# Patient Record
Sex: Female | Born: 1935
Health system: Southern US, Community
[De-identification: ages and names within clinical notes are randomized; demographics above are authoritative.]

## PROBLEM LIST (undated history)

## (undated) DIAGNOSIS — M1990 Unspecified osteoarthritis, unspecified site: Secondary | ICD-10-CM

## (undated) DIAGNOSIS — I482 Chronic atrial fibrillation, unspecified: Secondary | ICD-10-CM

## (undated) DIAGNOSIS — E538 Deficiency of other specified B group vitamins: Secondary | ICD-10-CM

## (undated) DIAGNOSIS — N3281 Overactive bladder: Secondary | ICD-10-CM

## (undated) DIAGNOSIS — M21061 Valgus deformity, not elsewhere classified, right knee: Secondary | ICD-10-CM

## (undated) DIAGNOSIS — D6869 Other thrombophilia: Secondary | ICD-10-CM

## (undated) DIAGNOSIS — I4891 Unspecified atrial fibrillation: Secondary | ICD-10-CM

## (undated) DIAGNOSIS — H9113 Presbycusis, bilateral: Secondary | ICD-10-CM

## (undated) DIAGNOSIS — E785 Hyperlipidemia, unspecified: Secondary | ICD-10-CM

## (undated) DIAGNOSIS — Z85038 Personal history of other malignant neoplasm of large intestine: Secondary | ICD-10-CM

## (undated) DIAGNOSIS — R410 Disorientation, unspecified: Secondary | ICD-10-CM

## (undated) DIAGNOSIS — C801 Malignant (primary) neoplasm, unspecified: Secondary | ICD-10-CM

## (undated) DIAGNOSIS — I639 Cerebral infarction, unspecified: Secondary | ICD-10-CM

## (undated) DIAGNOSIS — I10 Essential (primary) hypertension: Secondary | ICD-10-CM

## (undated) DIAGNOSIS — F01A0 Vascular dementia, mild, without behavioral disturbance, psychotic disturbance, mood disturbance, and anxiety: Secondary | ICD-10-CM

## (undated) DIAGNOSIS — M85839 Other specified disorders of bone density and structure, unspecified forearm: Secondary | ICD-10-CM

## (undated) DIAGNOSIS — H269 Unspecified cataract: Secondary | ICD-10-CM

## (undated) DIAGNOSIS — C189 Malignant neoplasm of colon, unspecified: Secondary | ICD-10-CM

## (undated) DIAGNOSIS — R569 Unspecified convulsions: Secondary | ICD-10-CM

## (undated) DIAGNOSIS — G2581 Restless legs syndrome: Secondary | ICD-10-CM

## (undated) DIAGNOSIS — D7589 Other specified diseases of blood and blood-forming organs: Secondary | ICD-10-CM

## (undated) DIAGNOSIS — I499 Cardiac arrhythmia, unspecified: Secondary | ICD-10-CM

## (undated) DIAGNOSIS — F419 Anxiety disorder, unspecified: Secondary | ICD-10-CM

## (undated) DIAGNOSIS — K219 Gastro-esophageal reflux disease without esophagitis: Secondary | ICD-10-CM

## (undated) DIAGNOSIS — R011 Cardiac murmur, unspecified: Secondary | ICD-10-CM

## (undated) DIAGNOSIS — H532 Diplopia: Secondary | ICD-10-CM

## (undated) DIAGNOSIS — E039 Hypothyroidism, unspecified: Secondary | ICD-10-CM

## (undated) DIAGNOSIS — M069 Rheumatoid arthritis, unspecified: Secondary | ICD-10-CM

## (undated) DIAGNOSIS — D649 Anemia, unspecified: Secondary | ICD-10-CM

## (undated) DIAGNOSIS — K5903 Drug induced constipation: Secondary | ICD-10-CM

## (undated) DIAGNOSIS — T7840XA Allergy, unspecified, initial encounter: Secondary | ICD-10-CM

## (undated) DIAGNOSIS — Z780 Asymptomatic menopausal state: Secondary | ICD-10-CM

## (undated) HISTORY — PX: TONSILLECTOMY: SUR1361

## (undated) HISTORY — PX: COLON SURGERY: SHX602

## (undated) HISTORY — DX: Allergy, unspecified, initial encounter: T78.40XA

## (undated) HISTORY — DX: Deficiency of other specified B group vitamins: E53.8

## (undated) HISTORY — DX: Chronic atrial fibrillation, unspecified: I48.20

## (undated) HISTORY — DX: Other thrombophilia: D68.69

## (undated) HISTORY — DX: Unspecified cataract: H26.9

## (undated) HISTORY — DX: Diplopia: H53.2

## (undated) HISTORY — DX: Malignant neoplasm of colon, unspecified: C18.9

## (undated) HISTORY — DX: Valgus deformity, not elsewhere classified, right knee: M21.061

## (undated) HISTORY — DX: Personal history of other malignant neoplasm of large intestine: Z85.038

## (undated) HISTORY — DX: Other specified diseases of blood and blood-forming organs: D75.89

## (undated) HISTORY — DX: Vascular dementia, mild, without behavioral disturbance, psychotic disturbance, mood disturbance, and anxiety: F01.A0

## (undated) HISTORY — PX: EYE SURGERY: SHX253

## (undated) HISTORY — DX: Presbycusis, bilateral: H91.13

## (undated) HISTORY — DX: Hyperlipidemia, unspecified: E78.5

## (undated) HISTORY — DX: Other specified disorders of bone density and structure, unspecified forearm: M85.839

## (undated) HISTORY — DX: Asymptomatic menopausal state: Z78.0

## (undated) HISTORY — DX: Overactive bladder: N32.81

## (undated) HISTORY — PX: COLONOSCOPY: SHX174

## (undated) HISTORY — DX: Unspecified atrial fibrillation: I48.91

## (undated) HISTORY — DX: Disorientation, unspecified: R41.0

---
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
## 1968-03-26 HISTORY — PX: ABDOMINAL HYSTERECTOMY: SHX81

## 2004-03-26 DIAGNOSIS — C801 Malignant (primary) neoplasm, unspecified: Secondary | ICD-10-CM

## 2004-03-26 HISTORY — PX: COLON RESECTION: SHX5231

## 2004-03-26 HISTORY — DX: Malignant (primary) neoplasm, unspecified: C80.1

## 2011-07-02 DIAGNOSIS — F32A Depression, unspecified: Secondary | ICD-10-CM | POA: Insufficient documentation

## 2011-07-02 DIAGNOSIS — F329 Major depressive disorder, single episode, unspecified: Secondary | ICD-10-CM | POA: Insufficient documentation

## 2013-01-12 LAB — HEMOGLOBIN A1C: Hemoglobin A1C: 5.5

## 2013-01-20 HISTORY — PX: MITRAL VALVE REPAIR: SHX2039

## 2013-01-20 HISTORY — PX: TRICUSPID VALVE SURGERY: SHX817

## 2013-01-21 DIAGNOSIS — R569 Unspecified convulsions: Secondary | ICD-10-CM | POA: Insufficient documentation

## 2013-07-14 DIAGNOSIS — I10 Essential (primary) hypertension: Secondary | ICD-10-CM | POA: Insufficient documentation

## 2013-09-07 LAB — HM HEPATITIS C SCREENING LAB: HM Hepatitis Screen: NEGATIVE

## 2013-09-09 LAB — HM DEXA SCAN

## 2013-11-10 DIAGNOSIS — G2581 Restless legs syndrome: Secondary | ICD-10-CM | POA: Insufficient documentation

## 2015-02-02 DIAGNOSIS — M48061 Spinal stenosis, lumbar region without neurogenic claudication: Secondary | ICD-10-CM | POA: Insufficient documentation

## 2015-03-01 ENCOUNTER — Other Ambulatory Visit: Payer: Self-pay | Admitting: Neurosurgery

## 2015-03-27 NOTE — H&P (Signed)
Patient ID:   [id number] Patient: Keyonia Dedeaux  Date of Birth: [birth year] Visit Type: Office Visit   Date: 02/16/2015 12:00 PM Provider: Marchia Meiers. Vertell Limber MD   This 82 year old female presents for back pain.  History of Present Illness: 1.  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  She reports onset of symptoms May 2016 after twisting.  She was evaluated by her rheumatologist, on orthopedist, and an ortho spine specialist who recommended extensive lumbar surgery.  She visits for second opinion.  She does note 2 falls recently, striking her head and upper back on concrete.  ESI 3 offered no relief  Physical therapy offered no relief  Tramadol 50 mg one half tablet to two tablets daily Cymbalta Tylenol when necessary  Plaquenil for rheumatoid arthritis  She did not tolerate codeine  History: HTN, colon cancer, skin cancer, thyroid, RA, Surgical history: Colon resection 2007, heart valve(x2) surgery 2014, hysterectomy 1985  MRI and x-rays on [place]  The patient is from [place] and her daughter feels that she does not have access to a lot of spine surgery options and would rather have her come here for medical care and convalescence at her house.  She has felt that the spinal injections have not given her significant relief.  She is having problems with pain into the top of her feet on both legs.  Lumbar radiographs were obtained in the office including dynamic views and these show anterolisthesis of L4 and L5 of 9 mm in neutral flexion and extension radiographs and 5 mm of anterolateral listhesis of L3 on L4 in neutral, 6 in flexion, 5 in extension.  MRI demonstrates synovial cyst at L4 L5 with significant spondylolisthesis at L3 L4 and L4 L5 levels with spinal stenosis at these levels.  The patient describes that she is quite uncomfortable and wants to get  some relief.  She does not feel that conservative management has been helpful and feels that she has to do something in order to get the quality of her life back.       PAST MEDICAL HISTORY, SURGICAL HISTORY, FAMILY HISTORY, SOCIAL HISTORY AND REVIEW OF SYSTEMS I have reviewed the patient's past medical, surgical, family and social history as well as the comprehensive review of systems as included on the [facility] history form dated 02/16/2015, which I have signed.   MEDICATIONS(added, continued or stopped this visit): Started Medication Directions Instruction Stopped   Arava 20 mg tablet take 1 tablet by oral route  every day     Aspir-81 81 mg tablet,delayed release take 1 tablet by oral route  every day     calcium carbonate 600 mg (1,500 mg) tablet      Cymbalta 20 mg capsule,delayed release take 1 capsule by oral route  every day     diclofenac 1 % topical gel apply (2G)  by topical route 3 times every day to the affected area(s)     FiberCon 625 mg tablet      fish oil 1200 ORAL TABLET      hydroxychloroquine 200 mg tablet take 1 tablet by oral route 2 times every day     levothyroxine 75 mcg tablet take 1 tablet by oral route  every day     metoprolol succinate ER 25 mg tablet,extended release 24 hr take 1 tablet by oral route  every day  multivitamin tablet      tramadol 50 mg tablet take 1 tablet by oral route  every 6 hours as needed     Zantac 150 mg tablet take 1 tablet by oral route 2 times every day       ALLERGIES: Ingredient Reaction Medication Name Comment  CODEINE     TRAMADOL HCL  Ultram   OPIOIDS - MORPHINE ANALOGUES      Reviewed, updated.    Vitals Date Temp F BP Pulse Ht In Wt Lb BMI BSA Pain Score  02/16/2015  165/97 82 63 122 21.61  6/10     PHYSICAL EXAM General Level of Distress: no acute distress Overall Appearance: normal  Head and Face  Right Left  Fundoscopic Exam:   normal normal    Cardiovascular Cardiac: regular rate and rhythm without murmur  Right Left  Carotid Pulses: normal normal  Respiratory Lungs: clear to auscultation  Neurological Orientation: normal Recent and Remote Memory: normal Attention Span and Concentration:   normal Language: normal Fund of Knowledge: normal  Right Left Sensation: normal normal Upper Extremity Coordination: normal normal  Lower Extremity Coordination: normal normal  Musculoskeletal Gait and Station: normal  Right Left Upper Extremity Muscle Strength: normal normal Lower Extremity Muscle Strength: normal normal Upper Extremity Muscle Tone:  normal normal Lower Extremity Muscle Tone: normal normal  Motor Strength Upper and lower extremity motor strength was tested in the clinically pertinent muscles. Any abnormal findings will be noted below.   Right Left Tib Anterior:  4/5 EHL:  4-/5   Deep Tendon Reflexes  Right Left Biceps: normal normal Triceps: normal normal Brachiloradialis: normal normal Patellar: normal normal Achilles: normal normal  Sensory Sensation was tested at L1 to S1. Any abnormal findings will be noted below.  Right Left L4:  hyperpathic  L5:  hyperpathic   Cranial Nerves II. Optic Nerve/Visual Fields: normal III. Oculomotor: normal IV. Trochlear: normal V. Trigeminal: normal VI. Abducens: normal VII. Facial: normal VIII. Acoustic/Vestibular: normal IX. Glossopharyngeal: normal X. Vagus: normal XI. Spinal Accessory: normal XII. Hypoglossal: normal  Motor and other Tests Lhermittes: negative Rhomberg: negative Pronator drift: absent     Right Left Hoffman's: normal normal Clonus: normal normal Babinski: normal normal SLR: negative positive Patrick's Corky Sox): negative negative Toe Walk: normal normal Toe Lift: normal normal Heel Walk: normal normal SI Joint: nontender nontender   Additional Findings:  The patient has left sciatic notch  discomfort to palpation.  She has left hip abductor weakness at 4-5.    IMPRESSION The patient has spondylolisthesis and spinal stenosis with lumbar synovial cysts at the L3 L4 and L4 L5 levels.  I have recommended to the patient that she could undergo decompression and fusion at the L3 L4 and L4 L5 levels.  She will need a bone density test prior to surgery.  She will also need cardiology clearance.  I think it unlikely we will twice interbody cages because of her relatively poor bone density but I will await for an official bone density test to make that decision.  She will be fitted for an LSO brace.  She has rheumatoid arthritis.  She and her daughter wish to inquire about postoperative rehabilitation options after her surgery.  Completed Orders (this encounter) Order Details Reason Side Interpretation Result Initial Treatment Date Region  Lifestyle education Follow up with primary care physician.        Lumbar Spine- AP/Lat/Obls/Spot/Flex/Ex      02/16/2015 All Levels to All Levels   Assessment/Plan #  Detail Type Description   1. Assessment Essential (primary) hypertension (I10).       2. Assessment Spondylolisthesis, lumbar region (M43.16).       3. Assessment Lumbar radiculopathy (M54.16).       4. Assessment Synovial cyst (M71.30).       5. Assessment Lumbar stenosis (M48.06).         Pain Assessment/Treatment Pain Scale: 6/10. Method: Numeric Pain Intensity Scale. Location: back. Onset: 08/16/2014. Duration: varies. Quality: discomforting. Pain Assessment/Treatment follow-up plan of care: Patient is taking medications as prescribed..  Fall Risk Plan The patient has not fallen in the last year.  Patient will go ahead with cardiac clearance and bone density testing and we will schedule surgery in the beginning of 2017.  Risks benefits were discussed in detail with the patient and she wished to proceed with surgery.  Orders: Office Procedures/Services: Assessment  Service Comments   Bone density  Cardiac Clearance    Diagnostic Procedures: Assessment Procedure  M43.16 Lumbar Spine- AP/Lat/Obls/Spot/Flex/Ex  Instruction(s)/Education: Assessment Instruction  I10 Lifestyle education             Provider:  Marchia Meiers. Vertell Limber MD  02/20/2015 01:03 PM Dictation edited by: Marchia Meiers. Vertell Limber    CC Providers: Erline Levine MD [address] [place], [place] [address]              Electronically signed by Marchia Meiers. Vertell Limber MD on 02/20/2015 01:03 PM

## 2015-04-11 ENCOUNTER — Encounter (HOSPITAL_COMMUNITY): Payer: Self-pay

## 2015-04-11 ENCOUNTER — Encounter (HOSPITAL_COMMUNITY)
Admission: RE | Admit: 2015-04-11 | Discharge: 2015-04-11 | Disposition: A | Discharge status: Home / Self Care | Payer: Medicare Other | Source: Ambulatory Visit | Attending: Neurosurgery | Admitting: Neurosurgery

## 2015-04-11 DIAGNOSIS — M7138 Other bursal cyst, other site: Secondary | ICD-10-CM | POA: Insufficient documentation

## 2015-04-11 DIAGNOSIS — Z01818 Encounter for other preprocedural examination: Secondary | ICD-10-CM

## 2015-04-11 DIAGNOSIS — Z8673 Personal history of transient ischemic attack (TIA), and cerebral infarction without residual deficits: Secondary | ICD-10-CM

## 2015-04-11 DIAGNOSIS — M069 Rheumatoid arthritis, unspecified: Secondary | ICD-10-CM

## 2015-04-11 DIAGNOSIS — I10 Essential (primary) hypertension: Secondary | ICD-10-CM | POA: Insufficient documentation

## 2015-04-11 DIAGNOSIS — Z79899 Other long term (current) drug therapy: Secondary | ICD-10-CM

## 2015-04-11 DIAGNOSIS — M4806 Spinal stenosis, lumbar region: Secondary | ICD-10-CM

## 2015-04-11 DIAGNOSIS — Z7982 Long term (current) use of aspirin: Secondary | ICD-10-CM

## 2015-04-11 DIAGNOSIS — Z85038 Personal history of other malignant neoplasm of large intestine: Secondary | ICD-10-CM

## 2015-04-11 DIAGNOSIS — E039 Hypothyroidism, unspecified: Secondary | ICD-10-CM | POA: Insufficient documentation

## 2015-04-11 DIAGNOSIS — Z01812 Encounter for preprocedural laboratory examination: Secondary | ICD-10-CM

## 2015-04-11 DIAGNOSIS — Z0183 Encounter for blood typing: Secondary | ICD-10-CM | POA: Insufficient documentation

## 2015-04-11 HISTORY — DX: Anxiety disorder, unspecified: F41.9

## 2015-04-11 HISTORY — DX: Gastro-esophageal reflux disease without esophagitis: K21.9

## 2015-04-11 HISTORY — DX: Essential (primary) hypertension: I10

## 2015-04-11 HISTORY — DX: Drug induced constipation: K59.03

## 2015-04-11 HISTORY — DX: Cardiac murmur, unspecified: R01.1

## 2015-04-11 HISTORY — DX: Malignant (primary) neoplasm, unspecified: C80.1

## 2015-04-11 HISTORY — DX: Unspecified osteoarthritis, unspecified site: M19.90

## 2015-04-11 HISTORY — DX: Hypothyroidism, unspecified: E03.9

## 2015-04-11 HISTORY — DX: Anemia, unspecified: D64.9

## 2015-04-11 HISTORY — DX: Unspecified convulsions: R56.9

## 2015-04-11 HISTORY — DX: Cerebral infarction, unspecified: I63.9

## 2015-04-11 HISTORY — DX: Cardiac arrhythmia, unspecified: I49.9

## 2015-04-11 HISTORY — DX: Rheumatoid arthritis, unspecified: M06.9

## 2015-04-11 HISTORY — DX: Restless legs syndrome: G25.81

## 2015-04-11 LAB — TYPE AND SCREEN
ABO/RH(D): O NEG
Antibody Screen: NEGATIVE

## 2015-04-11 LAB — CBC
HCT: 35.8 % — ABNORMAL LOW (ref 36.0–46.0)
Hemoglobin: 12 g/dL (ref 12.0–15.0)
MCH: 34.7 pg — ABNORMAL HIGH (ref 26.0–34.0)
MCHC: 33.5 g/dL (ref 30.0–36.0)
MCV: 103.5 fL — ABNORMAL HIGH (ref 78.0–100.0)
Platelets: 183 10*3/uL (ref 150–400)
RBC: 3.46 MIL/uL — ABNORMAL LOW (ref 3.87–5.11)
RDW: 13.8 % (ref 11.5–15.5)
WBC: 8 10*3/uL (ref 4.0–10.5)

## 2015-04-11 LAB — BASIC METABOLIC PANEL
Anion gap: 10 (ref 5–15)
BUN: 15 mg/dL (ref 6–20)
CO2: 27 mmol/L (ref 22–32)
Calcium: 9.4 mg/dL (ref 8.9–10.3)
Chloride: 99 mmol/L — ABNORMAL LOW (ref 101–111)
Creatinine, Ser: 0.71 mg/dL (ref 0.44–1.00)
GFR calc Af Amer: >60 mL/min (ref >60)
GFR calc non Af Amer: >60 mL/min (ref >60)
Glucose, Bld: 104 mg/dL — ABNORMAL HIGH (ref 65–99)
Potassium: 4.1 mmol/L (ref 3.5–5.1)
Sodium: 136 mmol/L (ref 135–145)

## 2015-04-11 LAB — SURGICAL PCR SCREEN
MRSA, PCR: NEGATIVE
Staphylococcus aureus: NEGATIVE

## 2015-04-11 LAB — ABO/RH: ABO/RH(D): O NEG

## 2015-04-11 MED ORDER — CEFAZOLIN SODIUM-DEXTROSE 2-3 GM-% IV SOLR
2.0000 g | INTRAVENOUS | Status: AC
  Administered 2015-04-12: 2 g via INTRAVENOUS
  Filled 2015-04-11: qty 50

## 2015-04-11 NOTE — Progress Notes (Signed)
Anesthesia Chart Review:  Pt is 82 year old female scheduled for L3-4, L4-5 maximum access posterior lumbar fusion, possible interbodies and resection of synovial cyst at L4-5 on 04/12/2015 with Dr. Vertell Limber.   Cardiologist is Dr. Liliane Shi in [place] (see care everywhere).   PMH includes:  PAF (occurred post-op, none since), HTN, heart murmur, TIA, anemia, hypothyroidism, colon cancer, RA, seizures (after MVR- took anti-seizure med for 1 year then was able to stop; no seizures since discharge from hospital 2014). Never smoker. BMI 22. S/p MVR and TVR 2014.   Pt reports her "heart stopped 2 times" during MVR/TVR surgery. Attempting to get records.   Medications include: ASA, hydroxychloroquine, leflunomide, levothyroxine, metoprolol, zantac.   Preoperative labs reviewed.    EKG requested. If does not come prior to surgery, pt will need EKG DOS.   Holter monitor 01/04/15: NSR. Occasional PVCs, frequent PACs. Short runs of PAT. No pauses or significant tachyarrhythmias noted. No atrial fibrillation noted. No symptoms reported.   Echo 12/30/14:  - LV is normal in size. EF 50-55% - LA is severely dilated.  - Annuloplasty ring in the mitral position, s/p MV repair - Mild MR, TR, PR  By notes, cardiac cath 10/07/12: normal coronary arteries  Nuclear stress test 03/07/12:  - Normal rest/stress SPECT myocardial perfusion images. No evidence of significant scar or ischemia. LV systolic function is normal.   Pt has cardiac clearance for surgery from Dr. Alroy Dust.   If no changes, I anticipate pt can proceed with surgery as scheduled.   Willeen Cass, FNP-BC [facility]/Anesthesiology Phone: [phone] 04/11/2015 4:51 PM

## 2015-04-11 NOTE — Pre-Procedure Instructions (Signed)
    Kathy Velez  04/11/2015    Your procedure is scheduled on Tuesday, January 17.  Report to [facility] Admitting at 8:00A.M.                Your surgery or procedure is scheduled for 11:40 AM  Call this number if you have problems the morning of surgery: [phone]   Remember:  Do not eat food or drink liquids after midnight.  Take these medicines the morning of surgery with A SIP OF WATER :DULoxetine (CYMBALTA),  levothyroxine (SYNTHROID, LEVOTHROID),   metoprolol succinate (TOPROL-XL),  ranitidine (ZANTAC).                STOP taking Aspirin, Vitamins, Herbal Medications (fish oil), diclofenac sodium (VOLTAREN) .   Do not wear jewelry, make-up or nail polish.  Do not wear lotions, powders, or perfumes.    Do not shave 48 hours prior to surgery.    Do not bring valuables to the hospital.  [facility] is not responsible for any belongings or valuables.  Contacts, dentures or bridgework may not be worn into surgery.  Leave your suitcase in the car.  After surgery it may be brought to your room.  For patients admitted to the hospital, discharge time will be determined by your treatment team.  Special instructions: Review  [facility] - Preparing For Surgery.  Please read over the following fact sheets that you were given. Pain Booklet, Coughing and Deep Breathing, Blood Transfusion Information and Surgical Site Infection Prevention

## 2015-04-11 NOTE — Anesthesia Preprocedure Evaluation (Addendum)
Anesthesia Evaluation  Patient identified by MRN, date of birth, ID band Patient awake    Reviewed: Allergy & Precautions, NPO status , Patient's Chart, lab work & pertinent test results  Airway Mallampati: II   Neck ROM: Full    Dental  (+) Dental Advisory Given, Teeth Intact   Pulmonary neg pulmonary ROS,    breath sounds clear to auscultation       Cardiovascular hypertension, Pt. on medications + dysrhythmias Supra Ventricular Tachycardia  Rhythm:Regular  SP MVR 2014, ECHO 12/2014 EF 55%, severely dilated Left atrium, has cardiac CL, has reported normal coronaries, Prone to SVT paroxsymal,    Neuro/Psych Seizures -, Well Controlled,  Anxiety Has been off seizure meds for 1 year without seizure    GI/Hepatic Neg liver ROS, GERD  Medicated,  Endo/Other    Renal/GU negative Renal ROS     Musculoskeletal  (+) Arthritis ,   Abdominal (+)  Abdomen: soft.    Peds  Hematology 12/36   Anesthesia Other Findings   Reproductive/Obstetrics                            Anesthesia Physical Anesthesia Plan  ASA: III  Anesthesia Plan: General   Post-op Pain Management:    Induction: Intravenous  Airway Management Planned: Oral ETT  Additional Equipment: Arterial line  Intra-op Plan:   Post-operative Plan: Extubation in OR  Informed Consent: I have reviewed the patients History and Physical, chart, labs and discussed the procedure including the risks, benefits and alternatives for the proposed anesthesia with the patient or authorized representative who has indicated his/her understanding and acceptance.     Plan Discussed with:   Anesthesia Plan Comments: (Arterial line be nice, 2nd IV after turning, verify T & S, convert to T & C early)        Anesthesia Quick Evaluation

## 2015-04-11 NOTE — Progress Notes (Signed)
Kathy Velez had Mitral Valve repair in 2014.  Patient reports that  "her heart stopped 2 times during heart surgery".  Patient reported that she had seizures after heart surgery and was on antiseizure medication for 1 year.  Has not had any seizure since discharge from hospital. Patient has had PAF post Heart Surgery, was on Coumadin for a bit, she is off Coumadin and is on Aspirin; Aspirin has been on hold since Tuesday, January 10. Patient denies shortness of breath or chest pain.

## 2015-04-12 ENCOUNTER — Encounter (HOSPITAL_COMMUNITY): Payer: Self-pay | Admitting: Surgery

## 2015-04-12 ENCOUNTER — Encounter (HOSPITAL_COMMUNITY)
Admission: RE | Disposition: A | Discharge status: Skilled Nursing Facility | Payer: Self-pay | Source: Ambulatory Visit | Attending: Neurosurgery

## 2015-04-12 ENCOUNTER — Inpatient Hospital Stay (HOSPITAL_COMMUNITY): Payer: Medicare Other

## 2015-04-12 ENCOUNTER — Inpatient Hospital Stay (HOSPITAL_COMMUNITY): Payer: Medicare Other | Admitting: Emergency Medicine

## 2015-04-12 ENCOUNTER — Inpatient Hospital Stay (HOSPITAL_COMMUNITY)
Admission: RE | Admit: 2015-04-12 | Discharge: 2015-04-19 | DRG: 460 | Disposition: A | Discharge status: Skilled Nursing Facility | Payer: Medicare Other | Source: Ambulatory Visit | Attending: Neurosurgery | Admitting: Neurosurgery

## 2015-04-12 ENCOUNTER — Inpatient Hospital Stay (HOSPITAL_COMMUNITY): Payer: Medicare Other | Admitting: Anesthesiology

## 2015-04-12 DIAGNOSIS — M549 Dorsalgia, unspecified: Secondary | ICD-10-CM | POA: Diagnosis present

## 2015-04-12 DIAGNOSIS — M4806 Spinal stenosis, lumbar region: Principal | ICD-10-CM | POA: Diagnosis present

## 2015-04-12 DIAGNOSIS — R339 Retention of urine, unspecified: Secondary | ICD-10-CM | POA: Diagnosis not present

## 2015-04-12 DIAGNOSIS — Z419 Encounter for procedure for purposes other than remedying health state, unspecified: Secondary | ICD-10-CM | POA: Diagnosis not present

## 2015-04-12 DIAGNOSIS — K219 Gastro-esophageal reflux disease without esophagitis: Secondary | ICD-10-CM | POA: Diagnosis present

## 2015-04-12 DIAGNOSIS — I483 Typical atrial flutter: Secondary | ICD-10-CM | POA: Diagnosis not present

## 2015-04-12 DIAGNOSIS — M069 Rheumatoid arthritis, unspecified: Secondary | ICD-10-CM | POA: Diagnosis present

## 2015-04-12 DIAGNOSIS — Z7982 Long term (current) use of aspirin: Secondary | ICD-10-CM

## 2015-04-12 DIAGNOSIS — Z8673 Personal history of transient ischemic attack (TIA), and cerebral infarction without residual deficits: Secondary | ICD-10-CM | POA: Diagnosis not present

## 2015-04-12 DIAGNOSIS — N3941 Urge incontinence: Secondary | ICD-10-CM | POA: Diagnosis not present

## 2015-04-12 DIAGNOSIS — R350 Frequency of micturition: Secondary | ICD-10-CM | POA: Diagnosis not present

## 2015-04-12 DIAGNOSIS — M7130 Other bursal cyst, unspecified site: Secondary | ICD-10-CM | POA: Diagnosis present

## 2015-04-12 DIAGNOSIS — K5900 Constipation, unspecified: Secondary | ICD-10-CM | POA: Diagnosis not present

## 2015-04-12 DIAGNOSIS — Z85038 Personal history of other malignant neoplasm of large intestine: Secondary | ICD-10-CM

## 2015-04-12 DIAGNOSIS — Z01810 Encounter for preprocedural cardiovascular examination: Secondary | ICD-10-CM | POA: Insufficient documentation

## 2015-04-12 DIAGNOSIS — I10 Essential (primary) hypertension: Secondary | ICD-10-CM | POA: Diagnosis present

## 2015-04-12 DIAGNOSIS — Z79899 Other long term (current) drug therapy: Secondary | ICD-10-CM | POA: Diagnosis not present

## 2015-04-12 DIAGNOSIS — M5416 Radiculopathy, lumbar region: Secondary | ICD-10-CM | POA: Diagnosis present

## 2015-04-12 DIAGNOSIS — E039 Hypothyroidism, unspecified: Secondary | ICD-10-CM | POA: Diagnosis present

## 2015-04-12 DIAGNOSIS — M4316 Spondylolisthesis, lumbar region: Secondary | ICD-10-CM | POA: Diagnosis present

## 2015-04-12 DIAGNOSIS — I4892 Unspecified atrial flutter: Secondary | ICD-10-CM | POA: Diagnosis not present

## 2015-04-12 DIAGNOSIS — Z9889 Other specified postprocedural states: Secondary | ICD-10-CM | POA: Diagnosis not present

## 2015-04-12 DIAGNOSIS — Z9181 History of falling: Secondary | ICD-10-CM | POA: Diagnosis not present

## 2015-04-12 DIAGNOSIS — R079 Chest pain, unspecified: Secondary | ICD-10-CM | POA: Diagnosis not present

## 2015-04-12 DIAGNOSIS — Z85828 Personal history of other malignant neoplasm of skin: Secondary | ICD-10-CM

## 2015-04-12 HISTORY — PX: MAXIMUM ACCESS (MAS)POSTERIOR LUMBAR INTERBODY FUSION (PLIF) 2 LEVEL: SHX6369

## 2015-04-12 SURGERY — FOR MAXIMUM ACCESS (MAS) POSTERIOR LUMBAR INTERBODY FUSION (PLIF) 2 LEVEL
Anesthesia: General | Site: Back

## 2015-04-12 MED ORDER — DOCUSATE SODIUM 100 MG PO CAPS
100.0000 mg | ORAL_CAPSULE | Freq: Two times a day (BID) | ORAL | Status: DC
  Administered 2015-04-12 – 2015-04-19 (×14): 100 mg via ORAL
  Filled 2015-04-12 (×14): qty 1

## 2015-04-12 MED ORDER — FAMOTIDINE 20 MG PO TABS
20.0000 mg | ORAL_TABLET | Freq: Every day | ORAL | Status: DC
  Administered 2015-04-13 – 2015-04-19 (×7): 20 mg via ORAL
  Filled 2015-04-12 (×7): qty 1

## 2015-04-12 MED ORDER — FLEET ENEMA 7-19 GM/118ML RE ENEM: 1.0000 | ENEMA | Freq: Once | RECTAL | Status: DC | PRN

## 2015-04-12 MED ORDER — HYDROCODONE-ACETAMINOPHEN 5-325 MG PO TABS: 1.0000 | ORAL_TABLET | Freq: Four times a day (QID) | ORAL | Status: DC | PRN

## 2015-04-12 MED ORDER — PNEUMOCOCCAL VAC POLYVALENT 25 MCG/0.5ML IJ INJ
0.5000 mL | INJECTION | INTRAMUSCULAR | Status: DC
  Filled 2015-04-12: qty 0.5

## 2015-04-12 MED ORDER — LACTATED RINGERS IV SOLN
INTRAVENOUS | Status: DC | PRN
  Administered 2015-04-12: 12:00:00 via INTRAVENOUS

## 2015-04-12 MED ORDER — OCUVITE-LUTEIN PO CAPS
1.0000 | ORAL_CAPSULE | Freq: Two times a day (BID) | ORAL | Status: DC
  Administered 2015-04-12 – 2015-04-17 (×9): 1 via ORAL
  Filled 2015-04-12 (×12): qty 1

## 2015-04-12 MED ORDER — BUPIVACAINE HCL (PF) 0.5 % IJ SOLN
INTRAMUSCULAR | Status: DC | PRN
  Administered 2015-04-12: 10 mL

## 2015-04-12 MED ORDER — 0.9 % SODIUM CHLORIDE (POUR BTL) OPTIME
TOPICAL | Status: DC | PRN
  Administered 2015-04-12 (×2): 1000 mL

## 2015-04-12 MED ORDER — FENTANYL CITRATE (PF) 250 MCG/5ML IJ SOLN
INTRAMUSCULAR | Status: AC
  Filled 2015-04-12: qty 5

## 2015-04-12 MED ORDER — ADULT MULTIVITAMIN W/MINERALS CH
1.0000 | ORAL_TABLET | Freq: Every day | ORAL | Status: DC
  Administered 2015-04-13 – 2015-04-19 (×7): 1 via ORAL
  Filled 2015-04-12 (×7): qty 1

## 2015-04-12 MED ORDER — ONDANSETRON HCL 4 MG/2ML IJ SOLN
INTRAMUSCULAR | Status: DC | PRN
  Administered 2015-04-12: 4 mg via INTRAVENOUS

## 2015-04-12 MED ORDER — PHENYLEPHRINE HCL 10 MG/ML IJ SOLN
INTRAMUSCULAR | Status: AC
  Filled 2015-04-12: qty 1

## 2015-04-12 MED ORDER — SUGAMMADEX SODIUM 200 MG/2ML IV SOLN
INTRAVENOUS | Status: DC | PRN
  Administered 2015-04-12: 111.6 mg via INTRAVENOUS

## 2015-04-12 MED ORDER — LIDOCAINE HCL (CARDIAC) 20 MG/ML IV SOLN
INTRAVENOUS | Status: AC
  Filled 2015-04-12: qty 5

## 2015-04-12 MED ORDER — STERILE WATER FOR INJECTION IJ SOLN
INTRAMUSCULAR | Status: AC
  Filled 2015-04-12: qty 10

## 2015-04-12 MED ORDER — PROMETHAZINE HCL 25 MG/ML IJ SOLN
6.2500 mg | INTRAMUSCULAR | Status: DC | PRN
Start: 2015-04-12 — End: 2015-04-12

## 2015-04-12 MED ORDER — MENTHOL 3 MG MT LOZG: 1.0000 | LOZENGE | OROMUCOSAL | Status: DC | PRN

## 2015-04-12 MED ORDER — LACTATED RINGERS IV SOLN
INTRAVENOUS | Status: DC
  Administered 2015-04-12 (×2): via INTRAVENOUS

## 2015-04-12 MED ORDER — VITAMIN D 1000 UNITS PO TABS
1000.0000 [IU] | ORAL_TABLET | Freq: Every day | ORAL | Status: DC
Start: 2015-04-13 — End: 2015-04-19
  Administered 2015-04-13 – 2015-04-19 (×7): 1000 [IU] via ORAL
  Filled 2015-04-12 (×7): qty 1

## 2015-04-12 MED ORDER — SUCCINYLCHOLINE CHLORIDE 20 MG/ML IJ SOLN
INTRAMUSCULAR | Status: AC
  Filled 2015-04-12: qty 1

## 2015-04-12 MED ORDER — STERILE WATER FOR IRRIGATION IR SOLN
Status: DC | PRN
  Administered 2015-04-12: 1000 mL

## 2015-04-12 MED ORDER — FENTANYL CITRATE (PF) 100 MCG/2ML IJ SOLN
25.0000 ug | INTRAMUSCULAR | Status: DC | PRN
  Administered 2015-04-12 (×4): 25 ug via INTRAVENOUS

## 2015-04-12 MED ORDER — FENTANYL CITRATE (PF) 100 MCG/2ML IJ SOLN
INTRAMUSCULAR | Status: AC
  Filled 2015-04-12: qty 2

## 2015-04-12 MED ORDER — NEOSTIGMINE METHYLSULFATE 10 MG/10ML IV SOLN
INTRAVENOUS | Status: AC
  Filled 2015-04-12: qty 5

## 2015-04-12 MED ORDER — DULOXETINE HCL 20 MG PO CPEP
20.0000 mg | ORAL_CAPSULE | Freq: Every day | ORAL | Status: DC
  Administered 2015-04-13 – 2015-04-19 (×7): 20 mg via ORAL
  Filled 2015-04-12 (×8): qty 1

## 2015-04-12 MED ORDER — METHOCARBAMOL 500 MG PO TABS
500.0000 mg | ORAL_TABLET | Freq: Four times a day (QID) | ORAL | Status: DC | PRN
  Administered 2015-04-12 – 2015-04-18 (×8): 500 mg via ORAL
  Filled 2015-04-12 (×8): qty 1

## 2015-04-12 MED ORDER — THROMBIN 20000 UNITS EX SOLR
CUTANEOUS | Status: DC | PRN
  Administered 2015-04-12: 20 mL via TOPICAL

## 2015-04-12 MED ORDER — PHENOL 1.4 % MT LIQD: 1.0000 | OROMUCOSAL | Status: DC | PRN

## 2015-04-12 MED ORDER — HYDROCODONE-ACETAMINOPHEN 5-325 MG PO TABS
1.0000 | ORAL_TABLET | ORAL | Status: DC | PRN
  Administered 2015-04-15 – 2015-04-19 (×2): 1 via ORAL
  Filled 2015-04-12: qty 2
  Filled 2015-04-12 (×2): qty 1

## 2015-04-12 MED ORDER — LIDOCAINE HCL (CARDIAC) 20 MG/ML IV SOLN
INTRAVENOUS | Status: DC | PRN
  Administered 2015-04-12: 100 mg via INTRAVENOUS

## 2015-04-12 MED ORDER — CALCIUM CARBONATE 1500 (600 CA) MG PO TABS
600.0000 mg | ORAL_TABLET | Freq: Two times a day (BID) | ORAL | Status: DC
Start: 2015-04-12 — End: 2015-04-13
  Administered 2015-04-12: 1500 mg via ORAL
  Filled 2015-04-12 (×4): qty 1

## 2015-04-12 MED ORDER — BUPIVACAINE LIPOSOME 1.3 % IJ SUSP
INTRAMUSCULAR | Status: DC | PRN
  Administered 2015-04-12: 20 mL

## 2015-04-12 MED ORDER — ZOLPIDEM TARTRATE 5 MG PO TABS
5.0000 mg | ORAL_TABLET | Freq: Every evening | ORAL | Status: DC | PRN
  Administered 2015-04-14: 5 mg via ORAL
  Filled 2015-04-12: qty 1

## 2015-04-12 MED ORDER — PHENYLEPHRINE 40 MCG/ML (10ML) SYRINGE FOR IV PUSH (FOR BLOOD PRESSURE SUPPORT)
PREFILLED_SYRINGE | INTRAVENOUS | Status: AC
  Filled 2015-04-12: qty 10

## 2015-04-12 MED ORDER — PANTOPRAZOLE SODIUM 40 MG IV SOLR
40.0000 mg | Freq: Every day | INTRAVENOUS | Status: DC
  Administered 2015-04-12: 40 mg via INTRAVENOUS
  Filled 2015-04-12: qty 40

## 2015-04-12 MED ORDER — FENTANYL CITRATE (PF) 100 MCG/2ML IJ SOLN
INTRAMUSCULAR | Status: DC | PRN
  Administered 2015-04-12: 50 ug via INTRAVENOUS
  Administered 2015-04-12 (×2): 25 ug via INTRAVENOUS
  Administered 2015-04-12: 50 ug via INTRAVENOUS
  Administered 2015-04-12 (×2): 100 ug via INTRAVENOUS

## 2015-04-12 MED ORDER — SODIUM CHLORIDE 0.9 % IJ SOLN: 3.0000 mL | INTRAMUSCULAR | Status: DC | PRN

## 2015-04-12 MED ORDER — ASPIRIN EC 81 MG PO TBEC
81.0000 mg | DELAYED_RELEASE_TABLET | Freq: Every day | ORAL | Status: DC
  Administered 2015-04-13 – 2015-04-19 (×7): 81 mg via ORAL
  Filled 2015-04-12 (×7): qty 1

## 2015-04-12 MED ORDER — DEXAMETHASONE SODIUM PHOSPHATE 10 MG/ML IJ SOLN
INTRAMUSCULAR | Status: DC | PRN
  Administered 2015-04-12: 8 mg via INTRAVENOUS

## 2015-04-12 MED ORDER — VITAMIN B-12 1000 MCG PO TABS
1000.0000 ug | ORAL_TABLET | Freq: Every day | ORAL | Status: DC
  Administered 2015-04-13 – 2015-04-19 (×7): 1000 ug via ORAL
  Filled 2015-04-12 (×7): qty 1

## 2015-04-12 MED ORDER — PROPOFOL 10 MG/ML IV BOLUS
INTRAVENOUS | Status: AC
  Filled 2015-04-12: qty 20

## 2015-04-12 MED ORDER — HYDROMORPHONE HCL 1 MG/ML IJ SOLN
0.5000 mg | INTRAMUSCULAR | Status: DC | PRN
  Administered 2015-04-12 – 2015-04-13 (×2): 1 mg via INTRAVENOUS
  Filled 2015-04-12 (×2): qty 1

## 2015-04-12 MED ORDER — ROCURONIUM BROMIDE 100 MG/10ML IV SOLN
INTRAVENOUS | Status: DC | PRN
  Administered 2015-04-12: 40 mg via INTRAVENOUS

## 2015-04-12 MED ORDER — ROCURONIUM BROMIDE 50 MG/5ML IV SOLN
INTRAVENOUS | Status: AC
  Filled 2015-04-12: qty 1

## 2015-04-12 MED ORDER — ACETAMINOPHEN 325 MG PO TABS
650.0000 mg | ORAL_TABLET | ORAL | Status: DC | PRN
  Administered 2015-04-14 – 2015-04-18 (×5): 650 mg via ORAL
  Filled 2015-04-12 (×5): qty 2

## 2015-04-12 MED ORDER — WHITE PETROLATUM GEL
Status: AC
  Filled 2015-04-12: qty 1

## 2015-04-12 MED ORDER — ONDANSETRON HCL 4 MG/2ML IJ SOLN
INTRAMUSCULAR | Status: AC
  Filled 2015-04-12: qty 2

## 2015-04-12 MED ORDER — DEXAMETHASONE SODIUM PHOSPHATE 10 MG/ML IJ SOLN
INTRAMUSCULAR | Status: AC
  Filled 2015-04-12: qty 1

## 2015-04-12 MED ORDER — ALUM & MAG HYDROXIDE-SIMETH 200-200-20 MG/5ML PO SUSP: 30.0000 mL | Freq: Four times a day (QID) | ORAL | Status: DC | PRN

## 2015-04-12 MED ORDER — DEXTROSE 5 % IV SOLN
500.0000 mg | Freq: Four times a day (QID) | INTRAVENOUS | Status: DC | PRN
  Filled 2015-04-12: qty 5

## 2015-04-12 MED ORDER — GLYCOPYRROLATE 0.2 MG/ML IJ SOLN
INTRAMUSCULAR | Status: AC
  Filled 2015-04-12: qty 1

## 2015-04-12 MED ORDER — PRESERVISION AREDS 2 PO CAPS: 1.0000 | ORAL_CAPSULE | Freq: Two times a day (BID) | ORAL | Status: DC

## 2015-04-12 MED ORDER — PHENYLEPHRINE HCL 10 MG/ML IJ SOLN
10.0000 mg | INTRAVENOUS | Status: DC | PRN
  Administered 2015-04-12: 25 ug/min via INTRAVENOUS

## 2015-04-12 MED ORDER — SODIUM CHLORIDE 0.9 % IJ SOLN
3.0000 mL | Freq: Two times a day (BID) | INTRAMUSCULAR | Status: DC
Start: 2015-04-12 — End: 2015-04-19
  Administered 2015-04-12 – 2015-04-19 (×9): 3 mL via INTRAVENOUS

## 2015-04-12 MED ORDER — SUGAMMADEX SODIUM 200 MG/2ML IV SOLN
INTRAVENOUS | Status: AC
  Filled 2015-04-12: qty 2

## 2015-04-12 MED ORDER — PROPOFOL 10 MG/ML IV BOLUS
INTRAVENOUS | Status: DC | PRN
  Administered 2015-04-12: 150 mg via INTRAVENOUS
  Administered 2015-04-12: 20 mg via INTRAVENOUS
  Administered 2015-04-12 (×2): 10 mg via INTRAVENOUS

## 2015-04-12 MED ORDER — PHENYLEPHRINE 40 MCG/ML (10ML) SYRINGE FOR IV PUSH (FOR BLOOD PRESSURE SUPPORT)
PREFILLED_SYRINGE | INTRAVENOUS | Status: AC
  Filled 2015-04-12: qty 20

## 2015-04-12 MED ORDER — ACETAMINOPHEN 650 MG RE SUPP: 650.0000 mg | RECTAL | Status: DC | PRN

## 2015-04-12 MED ORDER — ONDANSETRON HCL 4 MG/2ML IJ SOLN
4.0000 mg | INTRAMUSCULAR | Status: DC | PRN
  Administered 2015-04-12 – 2015-04-16 (×5): 4 mg via INTRAVENOUS
  Filled 2015-04-12 (×5): qty 2

## 2015-04-12 MED ORDER — CALCIUM POLYCARBOPHIL 625 MG PO TABS
625.0000 mg | ORAL_TABLET | Freq: Every day | ORAL | Status: DC
  Administered 2015-04-13 – 2015-04-19 (×7): 625 mg via ORAL
  Filled 2015-04-12 (×7): qty 1

## 2015-04-12 MED ORDER — LEVOTHYROXINE SODIUM 75 MCG PO TABS
75.0000 ug | ORAL_TABLET | Freq: Every day | ORAL | Status: DC
  Administered 2015-04-13 – 2015-04-19 (×7): 75 ug via ORAL
  Filled 2015-04-12 (×7): qty 1

## 2015-04-12 MED ORDER — CEFAZOLIN SODIUM 1-5 GM-% IV SOLN
1.0000 g | Freq: Three times a day (TID) | INTRAVENOUS | Status: AC
  Administered 2015-04-12 – 2015-04-13 (×2): 1 g via INTRAVENOUS
  Filled 2015-04-12 (×2): qty 50

## 2015-04-12 MED ORDER — OXYCODONE-ACETAMINOPHEN 5-325 MG PO TABS
1.0000 | ORAL_TABLET | ORAL | Status: DC | PRN
  Administered 2015-04-12 – 2015-04-14 (×5): 1 via ORAL
  Administered 2015-04-14: 2 via ORAL
  Administered 2015-04-15 (×2): 1 via ORAL
  Administered 2015-04-17: 2 via ORAL
  Filled 2015-04-12: qty 1
  Filled 2015-04-12 (×3): qty 2
  Filled 2015-04-12 (×5): qty 1

## 2015-04-12 MED ORDER — PHENYLEPHRINE HCL 10 MG/ML IJ SOLN
INTRAMUSCULAR | Status: DC | PRN
  Administered 2015-04-12: 40 ug via INTRAVENOUS
  Administered 2015-04-12 (×2): 80 ug via INTRAVENOUS
  Administered 2015-04-12 (×3): 40 ug via INTRAVENOUS
  Administered 2015-04-12: 80 ug via INTRAVENOUS

## 2015-04-12 MED ORDER — SODIUM CHLORIDE 0.9 % IV SOLN
250.0000 mL | INTRAVENOUS | Status: DC
Start: 2015-04-12 — End: 2015-04-19
  Administered 2015-04-13: 250 mL via INTRAVENOUS

## 2015-04-12 MED ORDER — LIDOCAINE-EPINEPHRINE 1 %-1:100000 IJ SOLN
INTRAMUSCULAR | Status: DC | PRN
  Administered 2015-04-12: 10 mL

## 2015-04-12 MED ORDER — BISACODYL 10 MG RE SUPP
10.0000 mg | Freq: Every day | RECTAL | Status: DC | PRN
  Administered 2015-04-16: 10 mg via RECTAL
  Filled 2015-04-12: qty 1

## 2015-04-12 MED ORDER — BUPIVACAINE LIPOSOME 1.3 % IJ SUSP
20.0000 mL | INTRAMUSCULAR | Status: DC
  Filled 2015-04-12: qty 20

## 2015-04-12 MED ORDER — KCL IN DEXTROSE-NACL 20-5-0.45 MEQ/L-%-% IV SOLN
INTRAVENOUS | Status: DC
Start: 2015-04-12 — End: 2015-04-19
  Administered 2015-04-14: 01:00:00 via INTRAVENOUS
  Filled 2015-04-12: qty 1000

## 2015-04-12 MED ORDER — MEPERIDINE HCL 25 MG/ML IJ SOLN: 6.2500 mg | INTRAMUSCULAR | Status: DC | PRN

## 2015-04-12 MED ORDER — METOPROLOL SUCCINATE ER 25 MG PO TB24
25.0000 mg | ORAL_TABLET | Freq: Every day | ORAL | Status: DC
  Administered 2015-04-13: 25 mg via ORAL
  Filled 2015-04-12: qty 1

## 2015-04-12 MED ORDER — POLYETHYLENE GLYCOL 3350 17 G PO PACK
17.0000 g | PACK | Freq: Every day | ORAL | Status: DC | PRN
  Administered 2015-04-16: 17 g via ORAL
  Filled 2015-04-12: qty 1

## 2015-04-12 SURGICAL SUPPLY — 88 items
BENZOIN TINCTURE PRP APPL 2/3 (GAUZE/BANDAGES/DRESSINGS) IMPLANT
BIT DRILL PLIF MAS 5.0MM DISP (DRILL) ×1 IMPLANT
BLADE CLIPPER SURG (BLADE) IMPLANT
BONE CANC CHIPS 20CC PCAN1/4 (Bone Implant) ×2 IMPLANT
BUR MATCHSTICK NEURO 3.0 LAGG (BURR) ×2 IMPLANT
BUR PRECISION FLUTE 5.0 (BURR) ×2 IMPLANT
BUR ROUND FLUTED 5 RND (BURR) ×2 IMPLANT
CANISTER SUCT 3000ML PPV (MISCELLANEOUS) ×2 IMPLANT
CHIPS CANC BONE 20CC PCAN1/4 (Bone Implant) ×1 IMPLANT
CONT SPEC 4OZ CLIKSEAL STRL BL (MISCELLANEOUS) IMPLANT
COVER BACK TABLE 24X17X13 BIG (DRAPES) IMPLANT
COVER BACK TABLE 60X90IN (DRAPES) ×2 IMPLANT
DECANTER SPIKE VIAL GLASS SM (MISCELLANEOUS) IMPLANT
DERMABOND ADVANCED (GAUZE/BANDAGES/DRESSINGS) ×1
DERMABOND ADVANCED .7 DNX12 (GAUZE/BANDAGES/DRESSINGS) ×1 IMPLANT
DRAPE C-ARM 42X72 X-RAY (DRAPES) ×2 IMPLANT
DRAPE C-ARMOR (DRAPES) ×2 IMPLANT
DRAPE LAPAROTOMY 100X72X124 (DRAPES) ×2 IMPLANT
DRAPE POUCH INSTRU U-SHP 10X18 (DRAPES) ×2 IMPLANT
DRAPE SURG 17X23 STRL (DRAPES) IMPLANT
DRILL PLIF MAS 5.0MM DISP (DRILL) ×2
DRSG OPSITE POSTOP 4X6 (GAUZE/BANDAGES/DRESSINGS) ×2 IMPLANT
DURAPREP 26ML APPLICATOR (WOUND CARE) ×2 IMPLANT
ELECT BLADE 4.0 EZ CLEAN MEGAD (MISCELLANEOUS) ×2
ELECT REM PT RETURN 9FT ADLT (ELECTROSURGICAL) ×2
ELECTRODE BLDE 4.0 EZ CLN MEGD (MISCELLANEOUS) ×1 IMPLANT
ELECTRODE REM PT RTRN 9FT ADLT (ELECTROSURGICAL) ×1 IMPLANT
EVACUATOR 1/8 PVC DRAIN (DRAIN) IMPLANT
GAUZE SPONGE 4X4 12PLY STRL (GAUZE/BANDAGES/DRESSINGS) IMPLANT
GAUZE SPONGE 4X4 16PLY XRAY LF (GAUZE/BANDAGES/DRESSINGS) IMPLANT
GLOVE BIO SURGEON STRL SZ7 (GLOVE) ×6 IMPLANT
GLOVE BIO SURGEON STRL SZ8 (GLOVE) ×2 IMPLANT
GLOVE BIOGEL PI IND STRL 7.5 (GLOVE) ×3 IMPLANT
GLOVE BIOGEL PI IND STRL 8 (GLOVE) ×1 IMPLANT
GLOVE BIOGEL PI IND STRL 8.5 (GLOVE) ×2 IMPLANT
GLOVE BIOGEL PI INDICATOR 7.5 (GLOVE) ×3
GLOVE BIOGEL PI INDICATOR 8 (GLOVE) ×1
GLOVE BIOGEL PI INDICATOR 8.5 (GLOVE) ×2
GLOVE ECLIPSE 8.0 STRL XLNG CF (GLOVE) ×2 IMPLANT
GLOVE EXAM NITRILE LRG STRL (GLOVE) IMPLANT
GLOVE EXAM NITRILE MD LF STRL (GLOVE) ×2 IMPLANT
GLOVE EXAM NITRILE XL STR (GLOVE) IMPLANT
GLOVE EXAM NITRILE XS STR PU (GLOVE) IMPLANT
GLOVE OPTIFIT SS 7.5 STRL LX (GLOVE) ×2 IMPLANT
GOWN STRL REUS W/ TWL LRG LVL3 (GOWN DISPOSABLE) ×3 IMPLANT
GOWN STRL REUS W/ TWL XL LVL3 (GOWN DISPOSABLE) ×3 IMPLANT
GOWN STRL REUS W/TWL 2XL LVL3 (GOWN DISPOSABLE) ×2 IMPLANT
GOWN STRL REUS W/TWL LRG LVL3 (GOWN DISPOSABLE) ×3
GOWN STRL REUS W/TWL XL LVL3 (GOWN DISPOSABLE) ×3
KIT BASIN OR (CUSTOM PROCEDURE TRAY) ×2 IMPLANT
KIT INFUSE MEDIUM (Orthopedic Implant) ×2 IMPLANT
KIT POSITION SURG JACKSON T1 (MISCELLANEOUS) ×2 IMPLANT
KIT ROOM TURNOVER OR (KITS) ×2 IMPLANT
MILL MEDIUM DISP (BLADE) ×2 IMPLANT
NEEDLE HYPO 21X1.5 SAFETY (NEEDLE) ×2 IMPLANT
NEEDLE HYPO 25X1 1.5 SAFETY (NEEDLE) ×2 IMPLANT
NEEDLE SPNL 18GX3.5 QUINCKE PK (NEEDLE) IMPLANT
NS IRRIG 1000ML POUR BTL (IV SOLUTION) ×2 IMPLANT
PACK FOAM VITOSS 10CC (Orthopedic Implant) ×2 IMPLANT
PACK LAMINECTOMY NEURO (CUSTOM PROCEDURE TRAY) ×2 IMPLANT
PAD ARMBOARD 7.5X6 YLW CONV (MISCELLANEOUS) ×6 IMPLANT
PATTIES SURGICAL .5 X.5 (GAUZE/BANDAGES/DRESSINGS) IMPLANT
PATTIES SURGICAL .5 X1 (DISPOSABLE) IMPLANT
PATTIES SURGICAL 1X1 (DISPOSABLE) IMPLANT
ROD 50MM LUMBAR (Rod) ×2 IMPLANT
ROD 55MM (Rod) ×1 IMPLANT
ROD SPNL 55XPREBNT NS MAS (Rod) ×1 IMPLANT
SCREW LOCK (Screw) ×6 IMPLANT
SCREW LOCK FXNS SPNE MAS PL (Screw) ×6 IMPLANT
SCREW PLIF MAS 5.5X35 LUMBAR (Screw) ×8 IMPLANT
SCREW SHANK 5.0X35 (Screw) ×4 IMPLANT
SCREW TULIP 5.5 (Screw) ×4 IMPLANT
SPONGE LAP 4X18 X RAY DECT (DISPOSABLE) IMPLANT
SPONGE SURGIFOAM ABS GEL 100 (HEMOSTASIS) ×2 IMPLANT
STAPLER SKIN PROX WIDE 3.9 (STAPLE) IMPLANT
STRIP CLOSURE SKIN 1/2X4 (GAUZE/BANDAGES/DRESSINGS) IMPLANT
SUT VIC AB 1 CT1 18XBRD ANBCTR (SUTURE) ×1 IMPLANT
SUT VIC AB 1 CT1 8-18 (SUTURE) ×1
SUT VIC AB 2-0 CT1 18 (SUTURE) ×4 IMPLANT
SUT VIC AB 3-0 SH 8-18 (SUTURE) ×2 IMPLANT
SYR 20CC LL (SYRINGE) ×2 IMPLANT
SYR 3ML LL SCALE MARK (SYRINGE) IMPLANT
SYR 5ML LL (SYRINGE) IMPLANT
TOWEL OR 17X24 6PK STRL BLUE (TOWEL DISPOSABLE) IMPLANT
TOWEL OR 17X26 10 PK STRL BLUE (TOWEL DISPOSABLE) ×2 IMPLANT
TRAP SPECIMEN MUCOUS 40CC (MISCELLANEOUS) ×2 IMPLANT
TRAY FOLEY W/METER SILVER 14FR (SET/KITS/TRAYS/PACK) ×2 IMPLANT
WATER STERILE IRR 1000ML POUR (IV SOLUTION) ×2 IMPLANT

## 2015-04-12 NOTE — Progress Notes (Signed)
Awake, alert, conversant.  MAEW with good strength both PF/DF/EHL.  Doing well.

## 2015-04-12 NOTE — Transfer of Care (Signed)
Immediate Anesthesia Transfer of Care Note  Patient: Kathy Velez  Procedure(s) Performed: Procedure(s) with comments: Lumbar Three-Five Decompression, Pedicle Screw Fixation, and Posteriolateral Arthrodesis (N/A) - L3-4 L4-5 Maximum access posterior lumbar fusion, possible interbodies and resection of synovial cyst at L4-5  Patient Location: PACU  Anesthesia Type:General  Level of Consciousness: awake, patient cooperative and responds to stimulation  Airway & Oxygen Therapy: Patient Spontanous Breathing and Patient connected to nasal cannula oxygen  Post-op Assessment: Report given to RN and Post -op Vital signs reviewed and stable  Post vital signs: Reviewed and stable  Last Vitals:  Filed Vitals:   04/12/15 0834 04/12/15 1454  BP:    Pulse:    Temp: 36.8 C 36.5 C  Resp:      Complications: No apparent anesthesia complications

## 2015-04-12 NOTE — Anesthesia Procedure Notes (Signed)
Procedure Name: Intubation Performed by: Judeth Cornfield T Pre-anesthesia Checklist: Patient identified, Timeout performed, Emergency Drugs available, Suction available and Patient being monitored Patient Re-evaluated:Patient Re-evaluated prior to inductionOxygen Delivery Method: Circle system utilized Preoxygenation: Pre-oxygenation with 100% oxygen Intubation Type: IV induction Ventilation: Mask ventilation without difficulty Laryngoscope Size: Miller and 2 Grade View: Grade II Tube type: Oral Tube size: 7.0 mm Number of attempts: 1 Airway Equipment and Method: Stylet Placement Confirmation: ETT inserted through vocal cords under direct vision,  breath sounds checked- equal and bilateral,  positive ETCO2 and CO2 detector Secured at: 21 cm Tube secured with: Tape Dental Injury: Teeth and Oropharynx as per pre-operative assessment  Comments: Per SRNA

## 2015-04-12 NOTE — Progress Notes (Signed)
Utilization review completed.  

## 2015-04-12 NOTE — Op Note (Signed)
04/12/2015  2:54 PM  PATIENT:  Kathy Velez  82 y.o. female  PRE-OPERATIVE DIAGNOSIS:  Lumbar stenosis, spondylolisthesis, Synovial cyst, Neurogenic claudication, Rheumatoid arthritis L 34 and L 45 levels  POST-OPERATIVE DIAGNOSIS:  Lumbar stenosis, spondylolisthesis, Synovial cyst, Neurogenic claudication, Rheumatoid arthritis L 34 and L 45 levels  PROCEDURE:  Procedure(s) with comments: Lumbar Three-Five Decompression, Pedicle Screw Fixation, and Posteriolateral Arthrodesis (N/A) - L3-4 L4-5 Maximum access posterior lumbar fusion and resection of synovial cyst at L4-5  Decompression Greater than for standard PLIF procedure  SURGEON:  Surgeon(s) and Role:    * Tamala Fothergill, MD - Assisting    * Erline Levine, MD - Primary  PHYSICIAN ASSISTANT:   ASSISTANTS: Poteat, RN, Wendi Snipes, PAS   ANESTHESIA:   general  EBL:  Total I/O In: 2100 [I.V.:2100] Out: 480 [Urine:380; Blood:100]  BLOOD ADMINISTERED:none  DRAINS: none   LOCAL MEDICATIONS USED:  MARCAINE    and LIDOCAINE   SPECIMEN:  No Specimen  DISPOSITION OF SPECIMEN:  N/A  COUNTS:  YES  TOURNIQUET:  * No tourniquets in log *  DICTATION: DICTATION: Patient is 82 year old woman with L 45 and L 34 spondylolisthesis with synovial cyst L 45 left with lumbar stenosis. She has a severe lumbar radiculopathy. it was elected to take her to surgery for decompression and fusion at these levels. Preoperative bone density studies demonstrated osteopenia.  She has Rheumatoid arthritis.  Procedure:   Following uncomplicated induction of GETA, and placement of electrodes for neural monitoring, patient was turned into a prone position on the [place] tableand using AP  fluoroscopy the area of planned incision was marked, prepped with betadine scrub and Duraprep, then draped. Exposure was performed of facet joint complex at L 34 and L 45 levels and the MAS retractor was placed.5.0 x 35 mm cortical Nuvasive screws were placed at L 3  bilaterally according to standard landmarks using neural monitoring.  A total laminectomy of L 3 and L 4 was then performed with disarticulation of facets.  Decompression was greater than for standard PLIF procedure and thorough decompression of the thecal sac, bilateral L 3, L 4, L 5 nerve roots was performed. Along with foraminal and extraforaminal portions of these nerve roots.  This bone was saved for grafting, combined with allograft after being run through bone mill and was placed in bone packing device.  Interbody grafts were not placed because of patient's poor bone quality.  A large synovial cyst was carefully removed on the left at L 45 level with careful decompression of all neural elements.  The cyst and stenotic material was densely adherent to thecal sac and this was very carefully decompressed under Loupe magnification. Decompression was greater than for standard PLIF procedure.    Remaining screws were placed at L 4 and and L 5 and 55 mm rods were placed. And the screws were locked and torqued. Final Xrays showed well positioned implants and screw fixation. The posterolateral region on the was packed with bilaterally with medium BMP, 30 cc of autograft on each side with 10 cc Vitoss. The wounds were irrigated and then closed with 1, 2-0 and 3-0 Vicryl stitches. 20 cc long-acting Marcaine was injected into the musculature.  Sterile occlusive dressing was placed with Dermabond and an occlusive dressing. The patient was then extubated in the operating room and taken to recovery in stable and satisfactory condition having tolerated her operation well. Counts were correct at the end of the case.  PLAN OF CARE: Admit to inpatient  PATIENT DISPOSITION:  PACU - hemodynamically stable.   Delay start of Pharmacological VTE agent (>24hrs) due to surgical blood loss or risk of bleeding: yes

## 2015-04-12 NOTE — Anesthesia Postprocedure Evaluation (Signed)
Anesthesia Post Note  Patient: Kathy Velez  Procedure(s) Performed: Procedure(s) (LRB): Lumbar Three-Five Decompression, Pedicle Screw Fixation, and Posteriolateral Arthrodesis (N/A)  Patient location during evaluation: PACU Anesthesia Type: General Level of consciousness: awake and alert Pain management: pain level controlled Vital Signs Assessment: post-procedure vital signs reviewed and stable Respiratory status: spontaneous breathing, nonlabored ventilation, respiratory function stable and patient connected to nasal cannula oxygen Cardiovascular status: blood pressure returned to baseline and stable Postop Assessment: no signs of nausea or vomiting Anesthetic complications: no    Last Vitals:  Filed Vitals:   04/12/15 0834 04/12/15 1454  BP:    Pulse:    Temp: 36.8 C 36.5 C  Resp:      Last Pain:  Filed Vitals:   04/12/15 1500  PainSc: 3                  Amada Hallisey

## 2015-04-12 NOTE — Progress Notes (Signed)
Patient admitted to 5M13. Patient is alert, oriented x3, states pain is a "4/10" discomfort by incision. SCDs on, IV x 2 flushed, equal strength in extremities, states tingling is in bilateral legs, mainly in left which is pre surgical baseline per patient, incision is covered by honey comb dressing which is clean, dry, intact. Brace is at patient's bedside. Patient updated on plan of care, staff, unit. Safety measures in place, will continue to monitor closely.

## 2015-04-12 NOTE — Brief Op Note (Signed)
04/12/2015  2:54 PM  PATIENT:  Kathy Velez  82 y.o. female  PRE-OPERATIVE DIAGNOSIS:  Lumbar stenosis, spondylolisthesis, Synovial cyst, Neurogenic claudication, Rheumatoid arthritis L 34 and L 45 levels  POST-OPERATIVE DIAGNOSIS:  Lumbar stenosis, spondylolisthesis, Synovial cyst, Neurogenic claudication, Rheumatoid arthritis L 34 and L 45 levels  PROCEDURE:  Procedure(s) with comments: Lumbar Three-Five Decompression, Pedicle Screw Fixation, and Posteriolateral Arthrodesis (N/A) - L3-4 L4-5 Maximum access posterior lumbar fusion and resection of synovial cyst at L4-5  Decompression Greater than for standard PLIF procedure  SURGEON:  Surgeon(s) and Role:    * Tamala Fothergill, MD - Assisting    * Erline Levine, MD - Primary  PHYSICIAN ASSISTANT:   ASSISTANTS: Poteat, RN, Wendi Snipes, PAS   ANESTHESIA:   general  EBL:  Total I/O In: 2100 [I.V.:2100] Out: 480 [Urine:380; Blood:100]  BLOOD ADMINISTERED:none  DRAINS: none   LOCAL MEDICATIONS USED:  MARCAINE    and LIDOCAINE   SPECIMEN:  No Specimen  DISPOSITION OF SPECIMEN:  N/A  COUNTS:  YES  TOURNIQUET:  * No tourniquets in log *  DICTATION: DICTATION: Patient is 82 year old woman with L 45 and L 34 spondylolisthesis with synovial cyst L 45 left with lumbar stenosis. She has a severe lumbar radiculopathy. it was elected to take her to surgery for decompression and fusion at these levels. Preoperative bone density studies demonstrated osteopenia.  She has Rheumatoid arthritis.  Procedure:   Following uncomplicated induction of GETA, and placement of electrodes for neural monitoring, patient was turned into a prone position on the [place] tableand using AP  fluoroscopy the area of planned incision was marked, prepped with betadine scrub and Duraprep, then draped. Exposure was performed of facet joint complex at L 34 and L 45 levels and the MAS retractor was placed.5.0 x 35 mm cortical Nuvasive screws were placed at L 3  bilaterally according to standard landmarks using neural monitoring.  A total laminectomy of L 3 and L 4 was then performed with disarticulation of facets.  Decompression was greater than for standard PLIF procedure and thorough decompression of the thecal sac, bilateral L 3, L 4, L 5 nerve roots was performed. Along with foraminal and extraforaminal portions of these nerve roots.  This bone was saved for grafting, combined with allograft after being run through bone mill and was placed in bone packing device.  Interbody grafts were not placed because of patient's poor bone quality.  A large synovial cyst was carefully removed on the left at L 45 level with careful decompression of all neural elements.  The cyst and stenotic material was densely adherent to thecal sac and this was very carefully decompressed under Loupe magnification. Decompression was greater than for standard PLIF procedure.    Remaining screws were placed at L 4 and and L 5 and 55 mm rods were placed. And the screws were locked and torqued. Final Xrays showed well positioned implants and screw fixation. The posterolateral region on the was packed with bilaterally with medium BMP, 30 cc of autograft on each side with 10 cc Vitoss. The wounds were irrigated and then closed with 1, 2-0 and 3-0 Vicryl stitches. 20 cc long-acting Marcaine was injected into the musculature.  Sterile occlusive dressing was placed with Dermabond and an occlusive dressing. The patient was then extubated in the operating room and taken to recovery in stable and satisfactory condition having tolerated her operation well. Counts were correct at the end of the case.  PLAN OF CARE: Admit to inpatient  PATIENT DISPOSITION:  PACU - hemodynamically stable.   Delay start of Pharmacological VTE agent (>24hrs) due to surgical blood loss or risk of bleeding: yes

## 2015-04-12 NOTE — Interval H&P Note (Signed)
History and Physical Interval Note:  04/12/2015 7:41 AM  Kathy Velez  has presented today for surgery, with the diagnosis of Lumbar stenosis  The various methods of treatment have been discussed with the patient and family. After consideration of risks, benefits and other options for treatment, the patient has consented to  Procedure(s) with comments: L3-4 L4-5 Maximum access posterior lumbar fusion, possible interbodies and resection of synovial cyst at L4-5 (N/A) - L3-4 L4-5 Maximum access posterior lumbar fusion, possible interbodies and resection of synovial cyst at L4-5 as a surgical intervention .  The patient's history has been reviewed, patient examined, no change in status, stable for surgery.  I have reviewed the patient's chart and labs.  Questions were answered to the patient's satisfaction.     [name] D

## 2015-04-13 ENCOUNTER — Encounter (HOSPITAL_COMMUNITY): Payer: Self-pay | Admitting: Neurosurgery

## 2015-04-13 DIAGNOSIS — I4892 Unspecified atrial flutter: Secondary | ICD-10-CM | POA: Diagnosis not present

## 2015-04-13 DIAGNOSIS — I483 Typical atrial flutter: Secondary | ICD-10-CM

## 2015-04-13 DIAGNOSIS — R079 Chest pain, unspecified: Secondary | ICD-10-CM | POA: Diagnosis not present

## 2015-04-13 DIAGNOSIS — M4316 Spondylolisthesis, lumbar region: Secondary | ICD-10-CM

## 2015-04-13 DIAGNOSIS — Z9889 Other specified postprocedural states: Secondary | ICD-10-CM

## 2015-04-13 LAB — TROPONIN I: Troponin I: <0.03 ng/mL (ref <0.031)

## 2015-04-13 MED ORDER — CALCIUM CARBONATE 1250 (500 CA) MG PO TABS
1.0000 | ORAL_TABLET | Freq: Two times a day (BID) | ORAL | Status: DC
  Administered 2015-04-13 – 2015-04-19 (×13): 500 mg via ORAL
  Filled 2015-04-13 (×12): qty 1

## 2015-04-13 MED ORDER — METOPROLOL SUCCINATE ER 25 MG PO TB24
37.5000 mg | ORAL_TABLET | Freq: Every day | ORAL | Status: DC
  Administered 2015-04-14: 37.5 mg via ORAL
  Filled 2015-04-13: qty 2

## 2015-04-13 MED ORDER — METOPROLOL SUCCINATE ER 25 MG PO TB24
12.5000 mg | ORAL_TABLET | ORAL | Status: AC
  Administered 2015-04-13: 12.5 mg via ORAL
  Filled 2015-04-13: qty 1

## 2015-04-13 MED ORDER — PANTOPRAZOLE SODIUM 40 MG PO TBEC
40.0000 mg | DELAYED_RELEASE_TABLET | Freq: Every day | ORAL | Status: DC
  Administered 2015-04-13 – 2015-04-18 (×6): 40 mg via ORAL
  Filled 2015-04-13 (×6): qty 1

## 2015-04-13 NOTE — Progress Notes (Signed)
D/C foley. Ambulated in hallway. Tolerated well.

## 2015-04-13 NOTE — Progress Notes (Signed)
OT Cancellation Note  Patient Details Name: Kathy Velez MRN: [id number] DOB: 17-Mar-1932   Cancelled Treatment:    Reason Eval/Treat Not Completed: Pain limiting ability to participate (Chest pain and pt waiting for in/out catheter ). Will attempt to see later this afternoon if time allows.   Redmond Baseman, OTR/L Pager: [phone] 04/13/2015, 2:25 PM

## 2015-04-13 NOTE — Progress Notes (Signed)
Patient is having chest pain of 4-5 , 12 lead reveled A-Flutter  BP 138/72 pulse 124,provider notified and rapid response was called and they are with patient, provider has asked for cardiac consult. Will continue to monitor.

## 2015-04-13 NOTE — Progress Notes (Signed)
Subjective: Patient reports "I've just been nauseated this morning. I sat in the chair for a while. I didn't have any pain."  Objective: Vital signs in last 24 hours: Temp:  [97.7 F (36.5 C)-98.5 F (36.9 C)] 98.3 F (36.8 C) (01/18 0628) Pulse Rate:  [52-106] 92 (01/18 0628) Resp:  [9-20] 14 (01/18 0628) BP: (128-181)/(74-100) 130/74 mmHg (01/18 0628) SpO2:  [98 %-100 %] 100 % (01/18 0628) Weight:  [55.792 kg (123 lb)] 55.792 kg (123 lb) (01/17 0833)  Intake/Output from previous day: 01/17 0701 - 01/18 0700 In: 2200 [I.V.:2200] Out: 1480 [Urine:1380; Blood:100] Intake/Output this shift:    Alert, conversant. Reports no pain at present. Good strength BLE. Incision without erythema, swelling, or drainage beneath honeycomb drsg and Dermabond. Appetite poor d/t mild nausea this am, responding to zofran. One episode vomiting last night.  Lab Results:  Recent Labs  04/11/15 1448  WBC 8.0  HGB 12.0  HCT 35.8*  PLT 183   BMET  Recent Labs  04/11/15 1448  NA 136  K 4.1  CL 99*  CO2 27  GLUCOSE 104*  BUN 15  CREATININE 0.71  CALCIUM 9.4    Studies/Results: Dg Lumbar Spine 2-3 Views  04/12/2015  CLINICAL DATA:  L3-5 PLIF.  Intraoperative images. EXAM: DG C-ARM 61-120 MIN; LUMBAR SPINE - 2-3 VIEW COMPARISON:  MRI lumbar spine performed at an outside facility 11/02/2014. FINDINGS: We are provided with 2 fluoroscopic intraoperative spot views of the lumbar spine. Images demonstrate pedicle screws in place at L3, L4 and L5. Laminectomy defects are identified. No acute abnormality is seen. IMPRESSION: L3-5 PLIF in progress. Electronically Signed   By: Inge Rise M.D.   On: 04/12/2015 15:31   Dg C-arm 1-60 Min  04/12/2015  CLINICAL DATA:  L3-5 PLIF.  Intraoperative images. EXAM: DG C-ARM 61-120 MIN; LUMBAR SPINE - 2-3 VIEW COMPARISON:  MRI lumbar spine performed at an outside facility 11/02/2014. FINDINGS: We are provided with 2 fluoroscopic intraoperative spot views of  the lumbar spine. Images demonstrate pedicle screws in place at L3, L4 and L5. Laminectomy defects are identified. No acute abnormality is seen. IMPRESSION: L3-5 PLIF in progress. Electronically Signed   By: Inge Rise M.D.   On: 04/12/2015 15:31    Assessment/Plan: Improved pain levels   LOS: 1 day  Mobilize in LSO, controlling nausea. Plan to avoid morphine d/t nausea.  Pt does not tolerate pain meds well by history.    Verdis Prime 04/13/2015, 8:09 AM

## 2015-04-13 NOTE — Care Management Note (Signed)
Case Management Note  Patient Details  Name: Kathy Velez MRN: [id number] Date of Birth: Jul 01, 1931  Subjective/Objective:                    Action/Plan:Awaiting PT/OT evaluation. Will continue to follow.    Expected Discharge Date:                  Expected Discharge Plan:     In-House Referral:     Discharge planning Services     Post Acute Care Choice:    Choice offered to:     DME Arranged:    DME Agency:     HH Arranged:    [place] Agency:     Status of Service:     Medicare Important Message Given:    Date Medicare IM Given:    Medicare IM give by:    Date Additional Medicare IM Given:    Additional Medicare Important Message give by:     If discussed at [place] of Stay Meetings, dates discussed:    Additional Comments:  Delrae Sawyers, RN 04/13/2015, 11:49 AM

## 2015-04-13 NOTE — Evaluation (Signed)
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 She reports onset of symptoms May 2016 after twisting. She was evaluated by her rheumatologist, on orthopedist, and an ortho spine specialist who recommended extensive lumbar surgery.She does note 2 falls recently, striking her head and upper back on concrete. Pt now s/p Lumbar Three-Five Decompression, Pedicle Screw Fixation, and Posteriolateral Arthrodesis (N/A) - L3-4 L4-5 Maximum access posterior lumbar fusion and resection of synovial cyst at L4-5 (04/12/15)  Clinical Impression  Pt admitted with above diagnosis. Pt currently with functional limitations due to the deficits listed below (see PT Problem List).  Pt will benefit from skilled PT to increase their independence and safety with mobility to allow discharge to the venue listed below.  Pt was fairly active in her independent living apartment in [place] and recommend SNF to return to high PLOF.       Follow Up Recommendations SNF    Equipment Recommendations  None recommended by PT    Recommendations for Other Services       Precautions / Restrictions Precautions Precautions: Back Precaution Booklet Issued: Yes (comment) Precaution Comments: reviewed with pt and daughter Required Braces or Orthoses: Spinal Brace Spinal Brace: Lumbar corset;Applied in sitting position Restrictions Weight Bearing Restrictions: No      Mobility  Bed Mobility Overal bed mobility: Needs Assistance Bed Mobility: Rolling;Sidelying to Sit Rolling: Supervision Sidelying to sit: Min assist       General bed mobility comments: cues for proper technique and needed MIN A to get trunk upright. At EOB, donned lumbar corset and reviewed its use  Transfers Overall  transfer level: Needs assistance Equipment used: Rolling walker (2 wheeled) Transfers: Sit to/from Stand Sit to Stand: Min assist         General transfer comment: stood from bed and toilet.  Cueing for proper technique to avoid bending.  Ambulation/Gait Ambulation/Gait assistance: Min guard Ambulation Distance (Feet): 60 Feet (x2) Assistive device: Rolling walker (2 wheeled) Gait Pattern/deviations: Decreased stride length     General Gait Details: Amb with guarded gait pattern with decreased speed  Stairs            Wheelchair Mobility    Modified Rankin (Stroke Patients Only)       Balance Overall balance assessment: Needs assistance Sitting-balance support: Feet supported Sitting balance-Leahy Scale: Fair Sitting balance - Comments: Can sit without UE support, but more comfortable with UE supporting her     Standing balance-Leahy Scale: Poor Standing balance comment: requires RW                             Pertinent Vitals/Pain Pain Assessment: 0-10 Pain Score: 3  Pain Location: back Pain Descriptors / Indicators: Operative site guarding Pain Intervention(s): Monitored during session    Home Living Family/patient expects to be discharged to:: Skilled nursing facility                      Prior Function Level of Independence: Independent with assistive device(s)         Comments: Lives in senior living facility in [place].  Lives in independent living. Amb with rollator for longer distances, to dining room.     Hand Dominance        Extremity/Trunk  Assessment   Upper Extremity Assessment: Defer to OT evaluation           Lower Extremity Assessment: Overall WFL for tasks assessed      Cervical / Trunk Assessment: Other exceptions  Communication   Communication: HOH  Cognition Arousal/Alertness: Awake/alert Behavior During Therapy: WFL for tasks assessed/performed Overall Cognitive Status: Within Functional  Limits for tasks assessed                      General Comments General comments (skin integrity, edema, etc.): Daughter present    Exercises        Assessment/Plan    PT Assessment Patient needs continued PT services  PT Diagnosis Difficulty walking;Acute pain   PT Problem List Decreased strength;Decreased balance;Decreased mobility;Decreased coordination;Decreased knowledge of use of DME;Decreased knowledge of precautions  PT Treatment Interventions DME instruction;Gait training;Functional mobility training;Therapeutic activities;Therapeutic exercise;Balance training   PT Goals (Current goals can be found in the Care Plan section) Acute Rehab PT Goals Patient Stated Goal: to go to rehab before returning to her I living apartment in [place] PT Goal Formulation: With patient/family Time For Goal Achievement: 04/20/15 Potential to Achieve Goals: Good    Frequency Min 5X/week   Barriers to discharge Decreased caregiver support lives alone    Co-evaluation               End of Session Equipment Utilized During Treatment: Gait belt Activity Tolerance: Patient tolerated treatment well Patient left: in chair;with call bell/phone within reach;with family/visitor present Nurse Communication: Mobility status         Time: [id number] (no charge for time when pt on toilet and nurse giving meds.)-1131 PT Time Calculation (min) (ACUTE ONLY): 57 min   Charges:   PT Evaluation $PT Eval Moderate Complexity: 1 Procedure PT Treatments $Gait Training: 8-22 mins $Therapeutic Activity: 8-22 mins   PT G Codes:        Almedia Cordell LUBECK 04/13/2015, 11:52 AM

## 2015-04-13 NOTE — Progress Notes (Signed)
Patient ID: Kathy Velez, female   DOB: Jul 19, 1931, 82 y.o.   MRN: [id number] Afetr returning to bed, pt reported substernal chest pain radiated to left chest/clavicle and to left scapula. No tachypnea, no diaphoresis, no nausea. HR ~100 irreg now, resp 20, SpO2 100 on O2 via n.c.  Cardiology consult requested. Pt presently reports mild persistent substernal pain, but appears in no distress.  Pt recalls a.fib. after valve repair 2014.   Followed by Cardiologist Liliane Shi MD [phone]) [place] [facility])  Hx: 2014 valve repair x2.   Currently monitoring condition, awaits cardiology eval.  [Nausea this am has resolved & pt reports no problems with liquids for lunch. Also reports BM without straining and ambulation back to bed without difficulty before this episode. She has required no pain med since this am's morphine (which caused nausea?)]  Verdis Prime RN BSN

## 2015-04-13 NOTE — Consult Note (Addendum)
CONSULTATION NOTE  Reason for Consult: Atrial flutter, chest pain  Requesting Physician: Dr. Vertell Limber  Cardiologist: Dr. Liliane Shi (not heart Institute, [place], [place])  HPI: This is a 82 y.o. female with a past medical history significant for paroxysmal atrial fibrillation and history of mitral valve repair (2014 with Gore-Tex cords and 30 mm physio-ring) and tricuspid repair (28 mm Triad ring) as well as essential hypertension, RA, history of seizures and TIA, RLS, anemia and colon cancer. She last had cardiac catheterization in 2014 prior to mitral and tricuspid valve repair which was facilitated by intra-aortic balloon pump. Her coronary arteries were normal. She was noted to have a CHADSVASC score of 4 at the time and was on Coumadin until December 2015. She wore a Holter monitor in October 2016 which showed occasional PVCs, frequent PACs and short runs of PAT. No A. fib or atrial flutter was noted. [name] had progressive symptoms of sciatica and was referred to [facility] for surgical evaluation. She was diagnosed with lumbar stenosis, spondylolisthesis, synovial cyst, neurogenic claudication, rheumatoid arthritis at the L3/4 and L4/5 levels. She underwent lumbar 3-5 decompression, pedicle screw fixation and posterior lateral arthrodesis by Dr. Vertell Limber. Her cardiologist cleared her for surgery and her extensive surgical history was known prior to proceeding.  Today she was noted after returning to bed to have some substernal chest discomfort which radiated to left chest and clavicle and to the left scapula. Heart rate was noted to be irregularly irregular in the low 100s. Cardiology was asked to consult regarding chest pain and abnormal heart rhythm.  PMHx:  Past Medical History  Diagnosis Date  . Dysrhythmia     PAF  . Arthritis     Back  . RA (rheumatoid arthritis) ([place])   . Hypothyroidism   . Hypertension   . Heart murmur   . Seizures ([place])     after Heart [facility]ge[facility]  . Stroke [facility])       TIA- found by neurologist after   . Anxiety   . GERD (gastroesophageal reflux disease)   . Constipation due to pain medication therapy     after heart surgery  . Restless leg   . Anemia   . Cancer [facility]) 2006    Colon.  Basal Cell Skin cancer- right arm   Past Surgical History  Procedure Laterality Date  . Mitral valve repair  01/20/13    Gore-tex cords to P1, P2, and P3. Magic suture to posterior medial commisure, #30 Physio 1 ring. Done in [place]  . Colon resection  2006    cancer  . Colonoscopy    . Eye surgery Bilateral     Cataract  . Abdominal hysterectomy      Partial   . Tricuspid valve surgery  01/20/13    #28 TriAd ring done in [place]  . Maximum access (mas)posterior lumbar interbody fusion (plif) 2 level N/A 04/12/2015    Procedure: Lumbar Three-Five Decompression, Pedicle Screw Fixation, and Posteriolateral Arthrodesis;  Surgeon: Erline Levine, MD;  Location: [place] [facility];  Service: Neurosurgery;  Laterality: N/A;  L3-4 L4-5 Maximum access posterior lumbar fusion, possible interbodies and resection of synovial cyst at L4-5    FAMHx: Family History  Problem Relation Age of Onset  . Vaginal cancer Mother   . Alcohol abuse Son     SOCHx:  reports that she has never smoked. She does not have any smokeless tobacco history on file. She reports that she drinks about 0.6 oz of alcohol per week.  She reports that she does not use illicit drugs.  ALLERGIES: Allergies  Allergen Reactions  . Codeine Other (See Comments)    "just don't take it well"  . Bee Venom Swelling and Rash    ROS: Pertinent items noted in HPI and remainder of comprehensive ROS otherwise negative.  HOME MEDICATIONS:   Medication List    ASK your doctor about these medications        aspirin EC 81 MG tablet  Take 81 mg by mouth daily.     calcium carbonate 1500 (600 Ca) MG Tabs tablet  Commonly known as:  OSCAL  Take 600 mg of elemental calcium by mouth 2 (two) times daily with a meal.      cholecalciferol 1000 units tablet  Commonly known as:  VITAMIN D  Take 1,000 Units by mouth daily.     diclofenac sodium 1 % Gel  Commonly known as:  VOLTAREN  Apply 4 g topically 4 (four) times daily.     DULoxetine 20 MG capsule  Commonly known as:  CYMBALTA  Take 20 mg by mouth daily.     Fish Oil 1000 MG Caps  Take 1,000 mg by mouth daily.     HYDROcodone-acetaminophen 5-325 MG tablet  Commonly known as:  NORCO/VICODIN  Take 1 tablet by mouth every 6 (six) hours as needed for moderate pain.     hydroxychloroquine 200 MG tablet  Commonly known as:  PLAQUENIL  Take 200 mg by mouth daily.     leflunomide 20 MG tablet  Commonly known as:  ARAVA  Take 20 mg by mouth daily.     levothyroxine 75 MCG tablet  Commonly known as:  SYNTHROID, LEVOTHROID  Take 75 mcg by mouth daily before breakfast.     metoprolol succinate 25 MG 24 hr tablet  Commonly known as:  TOPROL-XL  Take 25 mg by mouth daily.     multivitamin with minerals Tabs tablet  Take 1 tablet by mouth daily.     polycarbophil 625 MG tablet  Commonly known as:  FIBERCON  Take 625 mg by mouth daily.     PRESERVISION AREDS 2 Caps  Take 1 Dose by mouth 2 (two) times daily.     ranitidine 150 MG capsule  Commonly known as:  ZANTAC  Take 150 mg by mouth daily.     vitamin B-12 1000 MCG tablet  Commonly known as:  CYANOCOBALAMIN  Take 1,000 mcg by mouth daily.        HOSPITAL MEDICATIONS: I have reviewed the patient's current medications.  VITALS: Blood pressure 138/72, pulse 103, temperature 98.1 F (36.7 C), temperature source Oral, resp. rate 16, height 5' 2.5" (1.588 m), weight 123 lb (55.792 kg), SpO2 97 %.  PHYSICAL EXAM: General appearance: alert and no distress Neck: no carotid bruit and no JVD Lungs: clear to auscultation bilaterally Heart: irregularly irregular rhythm, S1, S2 normal and systolic murmur: early systolic 2/6, blowing at apex Abdomen: soft, non-tender; bowel sounds  normal; no masses,  no organomegaly Extremities: extremities normal, atraumatic, no cyanosis or edema Pulses: 2+ and symmetric Skin: Skin color, texture, turgor normal. No rashes or lesions Neurologic: Grossly normal Psych: Pleasant  LABS: No results found for this or any previous visit (from the past 48 hour(s)).  IMAGING: Dg Lumbar Spine 2-3 Views  04/12/2015  CLINICAL DATA:  L3-5 PLIF.  Intraoperative images. EXAM: DG C-ARM 61-120 MIN; LUMBAR SPINE - 2-3 VIEW COMPARISON:  MRI lumbar spine performed at an outside facility 11/02/2014.  FINDINGS: We are provided with 2 fluoroscopic intraoperative spot views of the lumbar spine. Images demonstrate pedicle screws in place at L3, L4 and L5. Laminectomy defects are identified. No acute abnormality is seen. IMPRESSION: L3-5 PLIF in progress. Electronically Signed   By: Inge Rise M.D.   On: 04/12/2015 15:31   Dg C-arm 1-60 Min  04/12/2015  CLINICAL DATA:  L3-5 PLIF.  Intraoperative images. EXAM: DG C-ARM 61-120 MIN; LUMBAR SPINE - 2-3 VIEW COMPARISON:  MRI lumbar spine performed at an outside facility 11/02/2014. FINDINGS: We are provided with 2 fluoroscopic intraoperative spot views of the lumbar spine. Images demonstrate pedicle screws in place at L3, L4 and L5. Laminectomy defects are identified. No acute abnormality is seen. IMPRESSION: L3-5 PLIF in progress. Electronically Signed   By: Inge Rise M.D.   On: 04/12/2015 15:31    HOSPITAL DIAGNOSES: Active Problems:   Spondylolisthesis of lumbar region   Atrial flutter (HCC)   Chest pain at rest   S/P MVR (mitral valve repair)   S/P TVR (tricuspid valve repair)   IMPRESSION: 1. Chest pain 2. New onset atrial flutter with variable ventricular response  RECOMMENDATION: 1. Chest pain - acute onset, worse with movement, radiates across entire chest, seems atypical for angina. She had mild, non-obstructive CAD by cath prior to MVR/TVR in 12/2012, therefore less likely to be  coronary. Check troponins. No acute ischemic eKG changes. Pain may be related to flutter, post-surgical, rheumatoid arthritis, etc. 2. Atrial flutter with RVR- heart rate is in the low 100-110 range. She does not seem to be significantly symptomatic with this. This patients CHA2DS2-VASc Score and unadjusted Ischemic Stroke Rate (% per year) is equal to 9.7 % stroke rate/year from a score of 6 Above score calculated as 1 point each if present [CHF, HTN, DM, Vascular=MI/PAD/Aortic Plaque, Age if 65-74, or Female] Above score calculated as 2 points each if present [Age > 75, or Stroke/TIA/TE]. She would benefit from anticoagulation for stroke risk reduction. I would recommend Eliquis 2.5 mg twice a day (age greater than 59 and weight less than 60 kg, therefore dose reduced). I understand we will have to wait until it is acceptable to start anticoagulation per Dr. Melven Sartorius recommendations. She is currently on low-dose aspirin which could be discontinued once she starts on Eliquis. I would then recommend a month of anticoagulation and consideration for cardioversion if she remains in atrial flutter. She would benefit from better rate control I would recommend increasing her Toprol-XL to 37.5 mg daily.  Cardiology will follow closely with you. Thanks for the consultation.  Time Spent Directly with Patient: 45 minutes  Pixie Casino, MD, [facility] Attending Cardiologist [place] 04/13/2015, 6:43 PM

## 2015-04-13 NOTE — Progress Notes (Signed)
Called to assist with patient with chest pain.  On arrival patient supine in bed - alert warm and dry- denies SOB, nausea, no diaphoresis.  States she had been to the bathroom - felt tired and had some chest discomfort - started midsternal - radiated to the left scapula.  She has history heart valve repair in [place] in 2014 at which time she remembers an irregular heart rate. Bil BS present - clear - patient endorses some worsening of pain with deep breathe.  12 lead reveals irregular rhythm - some atrial fib - rate low 100's.  Placed on 2 liter nasal cannula. Placed on telemetry.   BP 138/72 RR 22 O2 sats 100% on nasal cannula.  Rates pain 4/10 - says she feels better with O2.  Dr. Vertell Limber and Verdis Prime PA present in room.  NAD distress - cardiology consult called per neuro team.  Handoff to Annie Main RN - to call as needed. Will follow.

## 2015-04-14 DIAGNOSIS — Z419 Encounter for procedure for purposes other than remedying health state, unspecified: Secondary | ICD-10-CM

## 2015-04-14 DIAGNOSIS — Z01810 Encounter for preprocedural cardiovascular examination: Secondary | ICD-10-CM | POA: Insufficient documentation

## 2015-04-14 LAB — TROPONIN I: Troponin I: <0.03 ng/mL (ref <0.031)

## 2015-04-14 MED ORDER — METOPROLOL SUCCINATE ER 25 MG PO TB24
50.0000 mg | ORAL_TABLET | Freq: Every day | ORAL | Status: DC
  Administered 2015-04-15 – 2015-04-19 (×5): 50 mg via ORAL
  Filled 2015-04-14 (×6): qty 2

## 2015-04-14 MED ORDER — METOPROLOL SUCCINATE ER 25 MG PO TB24
12.5000 mg | ORAL_TABLET | ORAL | Status: AC
  Administered 2015-04-14: 12.5 mg via ORAL
  Filled 2015-04-14: qty 1

## 2015-04-14 NOTE — Clinical Social Work Note (Signed)
Clinical Social Work Assessment  Patient Details  Name: Kathy Velez MRN: [id number] Date of Birth: 10/22/1931  Date of referral:  04/14/15               Reason for consult:  Facility Placement, Discharge Planning                Permission sought to share information with:   (n/a) Permission granted to share information::  Yes, Verbal Permission Granted  Name::      (n/a)  Agency::   (SNF)  Relationship::   (n/a)  Contact Information:   (n/a)  Housing/Transportation Living arrangements for the past 2 months:  [place] of Information:  Patient Patient Interpreter Needed:  None Criminal Activity/Legal Involvement Pertinent to Current Situation/Hospitalization:  No - Comment as needed Significant Relationships:  Adult Children Lives with:  Facility Resident Do you feel safe going back to the place where you live?  No Need for family participation in patient care:  Yes (Comment)  Care giving concerns:   Patient did not express any care giving concerns at this time.   Social Worker assessment / plan:   Patient a/o x4. BSW intern has spoken with patient at bedside in reference to discharge disposition. BSW intern made patient aware of SNF recommendations given by PT. BSW intern further explained SNF process as well as insurance coverage. Patient is a long-term resident at [facility] located in [place], [place]. Patient informed BSW intern that she came to [facility] to receive a medical procedure and will be returning back to her residence once rehab is completed. Patient has expressed interest in [facility] and [facility]. Patient states that these two facilities would be closest to her daughter's house; therefore, visitation would be easier when necessary.  BSW intern to fax patient's clinical to SNF located within the [facility] region. BSW intern to follow up with patient regarding bed offers given. BSW intern remains available if any further  Social Work needs arises.   Employment status:  Retired Forensic scientist:  Medicare PT Recommendations:  [place] / Referral to community resources:  [place]  Patient/Family's Response to care:   Patient was very receptive and understanding during BSW intern's assessment. Patient seems hopeful in returning back to Senior Living facility once rehab is completed. Patient appreciated Social Work intervention given by [employer].  Patient/Family's Understanding of and Emotional Response to Diagnosis, Current Treatment, and Prognosis:   Patient understands need for further medical care and rehab at SNF.  Emotional Assessment Appearance:  Appears stated age Attitude/Demeanor/Rapport:   (pleasant, calm) Affect (typically observed):  Accepting, Appropriate, Calm Orientation:  Oriented to Self, Oriented to Place, Oriented to  Time, Oriented to Situation Alcohol / Substance use:  Not Applicable Psych involvement (Current and /or in the community):  No (Comment)  Discharge Needs  Concerns to be addressed:  No discharge needs identified Readmission within the last 30 days:  No Current discharge risk:  None Barriers to Discharge:  No Barriers Identified   Leane Call, Student-SW 04/14/2015, 2:45 PM

## 2015-04-14 NOTE — Progress Notes (Signed)
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 She reports onset of symptoms May 2016 after twisting. She was evaluated by her rheumatologist, on orthopedist, and an ortho spine specialist who recommended extensive lumbar surgery.She does note 2 falls recently, striking her head and upper back on concrete. Pt now s/p Lumbar Three-Five Decompression, Pedicle Screw Fixation, and Posteriolateral Arthrodesis (N/A) - L3-4 L4-5 Maximum access posterior lumbar fusion and resection of synovial cyst at L4-5 (04/12/15)   Clinical Impression   PTA, pt was independent with ADLs and used a rollator for mobility. Pt currently requires min guard assist for mobility and mod assist with LB ADLs. Began education on back precautions, brace wear protocol, and compensatory strategies for ADL tasks. Pt plans to d/c to SNF for post-acute rehab stay before returning to her senior living apartment. Pt will benefit from continued acute OT to increase independence and safety with ADLs to allow for safe discharge to the venue listed below.    Follow Up Recommendations  SNF    Equipment Recommendations  Other (comment) (TBD in next venue)    Recommendations for Other Services       Precautions / Restrictions Precautions Precautions: Back Precaution Booklet Issued: No Precaution Comments: Pt able to recall 2/3 back precautions at beginng of session.  Required Braces or Orthoses: Spinal Brace Spinal Brace: Lumbar corset;Applied in sitting position Restrictions Weight Bearing Restrictions: No      Mobility Bed Mobility Overal bed mobility: Needs Assistance Bed Mobility: Rolling;Sidelying to Sit Rolling: Supervision Sidelying to sit: Min assist       General bed  mobility comments: Pt up in chair on OT arrival  Transfers Overall transfer level: Needs assistance Equipment used: Rolling walker (2 wheeled) Transfers: Sit to/from Stand Sit to Stand: Min guard         General transfer comment: Min guard for safety. Verbal cues for safe hand placement on seated surfaces.    Balance Overall balance assessment: Needs assistance Sitting-balance support: No upper extremity supported;Feet supported Sitting balance-Leahy Scale: Fair Sitting balance - Comments: Can sit without UE support, but more comfortable with UE supporting her   Standing balance support: Bilateral upper extremity supported;During functional activity Standing balance-Leahy Scale: Poor Standing balance comment: requires UE support                            ADL Overall ADL's : Needs assistance/impaired                 Upper Body Dressing : Set up;Sitting   Lower Body Dressing: Moderate assistance;Cueing for compensatory techniques;Cueing for back precautions;Sit to/from stand Lower Body Dressing Details (indicate cue type and reason): Unable to cross ankle-over-knee currently, but dresses LB this way before surgery Toilet Transfer: Min guard;Cueing for safety;Ambulation;BSC;RW Toilet Transfer Details (indicate cue type and reason): BSC over toilet. Cues to feel BSC on back of legs before sitting Toileting- Clothing Manipulation and Hygiene: Min guard;Sit to/from stand;Cueing for compensatory techniques       Functional mobility during ADLs: Min guard;Rolling walker General ADL Comments: Reviewed brace wear protocol, back precautions, and compensatory strategies for some ADL tasks. Pt fatigues easily.     Vision Vision Assessment?: No apparent visual deficits   Perception     Praxis  Pertinent Vitals/Pain Pain Assessment: 0-10 Pain Score: 8  Pain Location: back and L thigh when moving Pain Descriptors / Indicators: Sore;Sharp Pain  Intervention(s): Limited activity within patient's tolerance;Monitored during session;Repositioned     Hand Dominance Right   Extremity/Trunk Assessment Upper Extremity Assessment Upper Extremity Assessment: Overall WFL for tasks assessed   Lower Extremity Assessment Lower Extremity Assessment: Overall WFL for tasks assessed   Cervical / Trunk Assessment Cervical / Trunk Assessment: Kyphotic   Communication Communication Communication: HOH   Cognition Arousal/Alertness: Awake/alert Behavior During Therapy: WFL for tasks assessed/performed Overall Cognitive Status: Within Functional Limits for tasks assessed       Memory: Decreased recall of precautions             General Comments       Exercises       Shoulder Instructions      Home Living Family/patient expects to be discharged to:: Skilled nursing facility Living Arrangements: Alone Available Help at Discharge: Friend(s);Available PRN/intermittently Type of Home: Apartment Home Access: Elevator     Home Layout: One level     Bathroom Shower/Tub: Walk-in shower;Door   [employer] Toilet: Standard Bathroom Accessibility: Yes How Accessible: Accessible via wheelchair;Accessible via walker Home Equipment: Grab bars - tub/shower;Adaptive equipment;Shower seat;Walker - 4 wheels Adaptive Equipment: Reacher        Prior Functioning/Environment Level of Independence: Independent with assistive device(s)        Comments: Lives in senior living facility in [place].  Lives in independent living. Amb with rollator for longer distances, to dining room.    OT Diagnosis: Acute pain   OT Problem List: Decreased strength;Decreased range of motion;Decreased activity tolerance;Impaired balance (sitting and/or standing);Decreased coordination;Decreased safety awareness;Decreased knowledge of use of DME or AE;Decreased knowledge of precautions;Pain   OT Treatment/Interventions: Self-care/ADL training;Therapeutic  exercise;Energy conservation;DME and/or AE instruction;Therapeutic activities;Patient/family education;Balance training    OT Goals(Current goals can be found in the care plan section) Acute Rehab OT Goals Patient Stated Goal: to go to rehab before returning to her I living apartment in [place] OT Goal Formulation: With patient Time For Goal Achievement: 04/28/15 Potential to Achieve Goals: Good ADL Goals Pt Will Perform Grooming: with supervision;standing Pt Will Perform Lower Body Bathing: with min assist;sitting/lateral leans;sit to/from stand Pt Will Perform Lower Body Dressing: with min assist;sitting/lateral leans;sit to/from stand Pt Will Transfer to Toilet: with supervision;ambulating;bedside commode Pt Will Perform Toileting - Clothing Manipulation and hygiene: with supervision;sitting/lateral leans;sit to/from stand Pt Will Perform Tub/Shower Transfer: Shower transfer;with supervision;ambulating;shower seat;rolling walker;grab bars Additional ADL Goal #1: Pt will independently don/doff back brace to increase independence with ADLs and mobility. Additional ADL Goal #2: Pt will demonstrate adherence to 3/3 back precautions to increase safety with ADLs and mobility.  OT Frequency: Min 2X/week   Barriers to D/C:            Co-evaluation              End of Session Equipment Utilized During Treatment: Gait belt;Rolling walker;Back brace Nurse Communication: Mobility status;Precautions  Activity Tolerance: Patient tolerated treatment well;Patient limited by fatigue Patient left: in chair;with call bell/phone within reach   Time: 1348-1409 OT Time Calculation (min): 21 min Charges:  OT General Charges $OT Visit: 1 Procedure OT Evaluation $OT Eval Moderate Complexity: 1 Procedure G-Codes:    Redmond Baseman, OTR/L [phone] 04/14/2015, 2:25 PM

## 2015-04-14 NOTE — Progress Notes (Signed)
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 She reports onset of symptoms May 2016 after twisting. She was evaluated by her rheumatologist, on orthopedist, and an ortho spine specialist who recommended extensive lumbar surgery.She does note 2 falls recently, striking her head and upper back on concrete. Pt now s/p Lumbar Three-Five Decompression, Pedicle Screw Fixation, and Posteriolateral Arthrodesis (N/A) - L3-4 L4-5 Maximum access posterior lumbar fusion and resection of synovial cyst at L4-5 (04/12/15)    PT Comments    Pt with most difficulty with bed mobility and sit <>stand.  Able to ambulate with min/guard, but did have 1 LOB requiring MIN A.  HR 132 at end of gait and nursing notified. Con't to recommend SNF.  Follow Up Recommendations  SNF     Equipment Recommendations  None recommended by PT    Recommendations for Other Services       Precautions / Restrictions Precautions Precautions: Back Precaution Comments: reviewed with pt and daughter Required Braces or Orthoses: Spinal Brace Spinal Brace: Lumbar corset;Applied in sitting position Restrictions Weight Bearing Restrictions: No    Mobility  Bed Mobility Overal bed mobility: Needs Assistance Bed Mobility: Rolling;Sidelying to Sit Rolling: Supervision Sidelying to sit: Min assist       General bed mobility comments: MIN A to get trunk upright.  Donned brace at EOB and re-adjusted in standing  Transfers Overall transfer level: Needs assistance Equipment used: Rolling walker (2 wheeled) Transfers: Sit to/from Stand Sit to Stand: Min assist         General transfer comment: cues for proper technique  Ambulation/Gait Ambulation/Gait assistance: Min guard;Min  assist Ambulation Distance (Feet): 120 Feet Assistive device: Rolling walker (2 wheeled) Gait Pattern/deviations: Decreased step length - left;Decreased step length - right Gait velocity: decreased   General Gait Details: Decreased step length with 1 LOB at beginning of gait. Decreased spped.  HR 132 at end of gait. nursing informed. Amb with 02 at 2 L/min   Stairs            Wheelchair Mobility    Modified Rankin (Stroke Patients Only)       Balance Overall balance assessment: Needs assistance Sitting-balance support: Feet supported Sitting balance-Leahy Scale: Fair Sitting balance - Comments: Can sit without UE support, but more comfortable with UE supporting her   Standing balance support: Bilateral upper extremity supported Standing balance-Leahy Scale: Poor Standing balance comment: requires UE support                    Cognition Arousal/Alertness: Awake/alert Behavior During Therapy: WFL for tasks assessed/performed Overall Cognitive Status: Within Functional Limits for tasks assessed                      Exercises      General Comments General comments (skin integrity, edema, etc.): daughter present      Pertinent Vitals/Pain Pain Assessment: 0-10 Pain Score: 8  Pain Location: back Pain Descriptors / Indicators: Operative site guarding;Grimacing Pain Intervention(s): Limited activity within patient's tolerance;Repositioned;Monitored during session    Home Living                      Prior Function            PT Goals (current goals can now  be found in the care plan section) Acute Rehab PT Goals Patient Stated Goal: to go to rehab before returning to her I living apartment in [place] PT Goal Formulation: With patient/family Time For Goal Achievement: 04/20/15 Potential to Achieve Goals: Good Progress towards PT goals: Progressing toward goals    Frequency  Min 5X/week    PT Plan Current plan remains appropriate     Co-evaluation             End of Session Equipment Utilized During Treatment: Gait belt;Back brace Activity Tolerance: Patient tolerated treatment well Patient left: in chair;with call bell/phone within reach;with family/visitor present     Time: [id number] PT Time Calculation (min) (ACUTE ONLY): 28 min  Charges:  $Gait Training: 8-22 mins $Therapeutic Activity: 8-22 mins                    G Codes:      Kenia Teagarden LUBECK 04/14/2015, 1:59 PM

## 2015-04-14 NOTE — Clinical Social Work Placement (Signed)
   CLINICAL SOCIAL WORK PLACEMENT  NOTE  Date:  04/14/2015  Patient Details  Name: Kathy Velez MRN: [id number] Date of Birth: 01-Jan-1932  Clinical Social Work is seeking post-discharge placement for this patient at the [place] level of care (*CSW will initial, date and re-position this form in  chart as items are completed):  Yes   Patient/family provided with [facility] Work Department's list of facilities offering this level of care within the geographic area requested by the patient (or if unable, by the patient's family).  Yes   Patient/family informed of their freedom to choose among providers that offer the needed level of care, that participate in Medicare, Medicaid or managed care program needed by the patient, have an available bed and are willing to accept the patient.  Yes   Patient/family informed of [facility]'s ownership interest in [facility] and [facility], as well as of the fact that they are under no obligation to receive care at these facilities.  PASRR submitted to EDS on       PASRR number received on       Existing PASRR number confirmed on       FL2 transmitted to all facilities in geographic area requested by pt/family on 04/14/15     FL2 transmitted to all facilities within larger geographic area on       Patient informed that his/her managed care company has contracts with or will negotiate with certain facilities, including the following:            Patient/family informed of bed offers received.  Patient chooses bed at       Physician recommends and patient chooses bed at      Patient to be transferred to   on  .  Patient to be transferred to facility by       Patient family notified on   of transfer.  Name of family member notified:        PHYSICIAN Please sign FL2     Additional Comment:    _______________________________________________ Leane Call, Student-SW 04/14/2015, 2:34 PM

## 2015-04-14 NOTE — Progress Notes (Signed)
Patient Name: Kathy Velez Date of Encounter: 04/14/2015   SUBJECTIVE  Feeling well. No chest pain, sob or palpitations.   CURRENT MEDS . aspirin EC  81 mg Oral Daily  . calcium carbonate  1 tablet Oral BID WC  . cholecalciferol  1,000 Units Oral Daily  . docusate sodium  100 mg Oral BID  . DULoxetine  20 mg Oral Daily  . famotidine  20 mg Oral Daily  . levothyroxine  75 mcg Oral QAC breakfast  . metoprolol succinate  37.5 mg Oral Daily  . multivitamin with minerals  1 tablet Oral Daily  . multivitamin-lutein  1 capsule Oral BID  . pantoprazole  40 mg Oral QHS  . pneumococcal 23 valent vaccine  0.5 mL Intramuscular Tomorrow-1000  . polycarbophil  625 mg Oral Daily  . sodium chloride  3 mL Intravenous Q12H  . vitamin B-12  1,000 mcg Oral Daily    OBJECTIVE  Filed Vitals:   04/13/15 2103 04/14/15 0150 04/14/15 0655 04/14/15 1125  BP: 132/88 149/84 168/97 172/79  Pulse: 113 88 94 75  Temp: 98.9 F (37.2 C) 98.6 F (37 C) 98.2 F (36.8 C) 98 F (36.7 C)  TempSrc: Oral Oral Oral Oral  Resp: 18 18 20 20   Height:      Weight:      SpO2: 98% 98% 100% 96%    Intake/Output Summary (Last 24 hours) at 04/14/15 1332 Last data filed at 04/14/15 0700  Gross per 24 hour  Intake      0 ml  Output   1101 ml  Net  -1101 ml   Filed Weights   04/12/15 0833  Weight: 123 lb (55.792 kg)    PHYSICAL EXAM  General: Pleasant, elderly woman in NAD sitting in chair Neuro: Alert and oriented X 3. Moves all extremities spontaneously. Psych: Normal affect. HEENT:  Normal  Neck: Supple without bruits or JVD. Lungs:  Resp regular and unlabored, CTA. Heart:  Regular tachycardic no s3, s4, or murmurs. Abdomen: Soft, bad placed Extremities: No clubbing, cyanosis or edema. DP/PT/Radials 2+ and equal bilaterally.  Accessory Clinical Findings  CBC  Recent Labs  04/11/15 1448  WBC 8.0  HGB 12.0  HCT 35.8*  MCV 103.5*  PLT [id number]   Basic Metabolic Panel  Recent Labs   04/11/15 1448  NA 136  K 4.1  CL 99*  CO2 27  GLUCOSE 104*  BUN 15  CREATININE 0.71  CALCIUM 9.4   Liver Function Tests No results for input(s): AST, ALT, ALKPHOS, BILITOT, PROT, ALBUMIN in the last 72 hours. No results for input(s): LIPASE, AMYLASE in the last 72 hours. Cardiac Enzymes  Recent Labs  04/13/15 2057 04/14/15 0053  TROPONINI <0.03 <0.03    TELE  Aflutter rate of 100-110s, transiently in 130s  Radiology/Studies  Dg Lumbar Spine 2-3 Views  04/12/2015  CLINICAL DATA:  L3-5 PLIF.  Intraoperative images. EXAM: DG C-ARM 61-120 MIN; LUMBAR SPINE - 2-3 VIEW COMPARISON:  MRI lumbar spine performed at an outside facility 11/02/2014. FINDINGS: We are provided with 2 fluoroscopic intraoperative spot views of the lumbar spine. Images demonstrate pedicle screws in place at L3, L4 and L5. Laminectomy defects are identified. No acute abnormality is seen. IMPRESSION: L3-5 PLIF in progress. Electronically Signed   By: Inge Rise M.D.   On: 04/12/2015 15:31   Dg C-arm 1-60 Min  04/12/2015  CLINICAL DATA:  L3-5 PLIF.  Intraoperative images. EXAM: DG C-ARM 61-120 MIN; LUMBAR SPINE - 2-3 VIEW COMPARISON:  MRI lumbar spine performed at an outside facility 11/02/2014. FINDINGS: We are provided with 2 fluoroscopic intraoperative spot views of the lumbar spine. Images demonstrate pedicle screws in place at L3, L4 and L5. Laminectomy defects are identified. No acute abnormality is seen. IMPRESSION: L3-5 PLIF in progress. Electronically Signed   By: Inge Rise M.D.   On: 04/12/2015 15:31    ASSESSMENT AND PLAN  1. Aflutter with RVR - rate is still high on Toprol XL 37.5. Will increase further to 50mg  from tomorrow and give addition 12.5mg  today. BP is also elevated.  - Plan to start Eliquis 2.5mg  once cleared by surgery. At least 1 month of anticoagulation and then consideration of DCCV if still in [place].   2. Chest pain - No further episode. Trop x 2 negative. EKG non  ischemic. No need for ischemic evaluation.   S/P MVR (mitral valve repair) S/P TVR (tricuspid valve repair)     Signed, Tecora Eustache PA-C

## 2015-04-14 NOTE — NC FL2 (Signed)
[place] LEVEL OF CARE SCREENING TOOL     IDENTIFICATION  Patient Name: Kathy Velez Birthdate: Apr 12, 1931 Sex: female Admission Date (Current Location): 04/12/2015  [place] and [place] Number:  Kathleen Argue [id number] ([place] [phone]) Facility and Address:  [facility], [place] [address] [place], [place] [address]      Provider Number: [id number]  Attending Physician Name and Address:  Erline Levine, MD  Relative Name and Phone Number:   (n/a)    Current Level of Care: Hospital Recommended Level of Care: [place] Prior Approval Number:    Date Approved/Denied:   PASRR Number:   [id number]  Discharge Plan: SNF    Current Diagnoses: Patient Active Problem List   Diagnosis Date Noted  . Atrial flutter ([place]) 04/13/2015  . Chest pain at rest 04/13/2015  . S/P MVR (mitral valve repair) 04/13/2015  . S/P TVR (tricuspid valve repair) 04/13/2015  . Spondylolisthesis of lumbar region 04/12/2015    Orientation RESPIRATION BLADDER Height & Weight    Self, Time, Situation, Place  O2 Continent 5\' 2"  (157.5 cm) 123 lbs.  BEHAVIORAL SYMPTOMS/MOOD NEUROLOGICAL BOWEL NUTRITION STATUS      Continent Diet (REGULAR)  AMBULATORY STATUS COMMUNICATION OF NEEDS Skin   Limited Assist   Surgical wounds (location:back)                       Personal Care Assistance Level of Assistance  Dressing     Dressing Assistance: Limited assistance     Functional Limitations Info  Hearing   Hearing Info: Impaired      SPECIAL CARE FACTORS FREQUENCY  OT (By licensed OT), PT (By licensed PT)     PT Frequency:  (5x/week) OT Frequency:  (2x/week)            Contractures      Additional Factors Info  Code Status, Allergies Code Status Info:  (FULL) Allergies Info:  (codeine, bee venom)           Current Medications (04/14/2015):  This is the current hospital active medication list Current  Facility-Administered Medications  Medication Dose Route Frequency Provider Last Rate Last Dose  . 0.9 %  sodium chloride infusion  250 mL Intravenous Continuous Erline Levine, MD 1 mL/hr at 04/13/15 1537 250 mL at 04/13/15 1537  . acetaminophen (TYLENOL) tablet 650 mg  650 mg Oral Q4H PRN Erline Levine, MD       Or  . acetaminophen (TYLENOL) suppository 650 mg  650 mg Rectal Q4H PRN Erline Levine, MD      . alum & mag hydroxide-simeth (MAALOX/MYLANTA) 200-200-20 MG/5ML suspension 30 mL  30 mL Oral Q6H PRN Erline Levine, MD      . aspirin EC tablet 81 mg  81 mg Oral Daily Erline Levine, MD   81 mg at 04/14/15 1038  . bisacodyl (DULCOLAX) suppository 10 mg  10 mg Rectal Daily PRN Erline Levine, MD      . calcium carbonate (OS-CAL - dosed in mg of elemental calcium) tablet 500 mg of elemental calcium  1 tablet Oral BID WC Erline Levine, MD   500 mg of elemental calcium at 04/14/15 0818  . cholecalciferol (VITAMIN D) tablet 1,000 Units  1,000 Units Oral Daily Erline Levine, MD   1,000 Units at 04/14/15 1038  . dextrose 5 % and 0.45 % NaCl with KCl 20 mEq/L infusion   Intravenous Continuous Erline Levine, MD 75 mL/hr at 04/14/15 0120    .  docusate sodium (COLACE) capsule 100 mg  100 mg Oral BID Erline Levine, MD   100 mg at 04/14/15 1037  . DULoxetine (CYMBALTA) DR capsule 20 mg  20 mg Oral Daily Erline Levine, MD   20 mg at 04/14/15 1038  . famotidine (PEPCID) tablet 20 mg  20 mg Oral Daily Erline Levine, MD   20 mg at 04/14/15 1041  . HYDROcodone-acetaminophen (NORCO/VICODIN) 5-325 MG per tablet 1-2 tablet  1-2 tablet Oral Q4H PRN Erline Levine, MD      . HYDROmorphone (DILAUDID) injection 0.5-1 mg  0.5-1 mg Intravenous Q2H PRN Erline Levine, MD   1 mg at 04/13/15 0615  . levothyroxine (SYNTHROID, LEVOTHROID) tablet 75 mcg  75 mcg Oral QAC breakfast Erline Levine, MD   75 mcg at 04/14/15 0818  . menthol-cetylpyridinium (CEPACOL) lozenge 3 mg  1 lozenge Oral PRN Erline Levine, MD       Or  . phenol (CHLORASEPTIC)  mouth spray 1 spray  1 spray Mouth/Throat PRN Erline Levine, MD      . methocarbamol (ROBAXIN) tablet 500 mg  500 mg Oral Q6H PRN Erline Levine, MD   500 mg at 04/14/15 [id number]   Or  . methocarbamol (ROBAXIN) 500 mg in dextrose 5 % 50 mL IVPB  500 mg Intravenous Q6H PRN Erline Levine, MD      . metoprolol succinate (TOPROL-XL) 24 hr tablet 12.5 mg  12.5 mg Oral NOW Bhavinkumar Bhagat, PA      . [START ON 04/15/2015] metoprolol succinate (TOPROL-XL) 24 hr tablet 50 mg  50 mg Oral Daily Bhavinkumar Bhagat, PA      . multivitamin with minerals tablet 1 tablet  1 tablet Oral Daily Erline Levine, MD   1 tablet at 04/14/15 1037  . multivitamin-lutein (OCUVITE-LUTEIN) capsule 1 capsule  1 capsule Oral BID Erline Levine, MD   1 capsule at 04/13/15 2213  . ondansetron (ZOFRAN) injection 4 mg  4 mg Intravenous Q4H PRN Erline Levine, MD   4 mg at 04/13/15 0737  . oxyCODONE-acetaminophen (PERCOCET/ROXICET) 5-325 MG per tablet 1-2 tablet  1-2 tablet Oral Q4H PRN Erline Levine, MD   2 tablet at 04/14/15 0705  . pantoprazole (PROTONIX) EC tablet 40 mg  40 mg Oral QHS Erline Levine, MD   40 mg at 04/13/15 2101  . pneumococcal 23 valent vaccine (PNU-IMMUNE) injection 0.5 mL  0.5 mL Intramuscular Tomorrow-1000 Erline Levine, MD   Stopped at 04/13/15 1117  . polycarbophil (FIBERCON) tablet 625 mg  625 mg Oral Daily Erline Levine, MD   625 mg at 04/14/15 1038  . polyethylene glycol (MIRALAX / GLYCOLAX) packet 17 g  17 g Oral Daily PRN Erline Levine, MD      . sodium chloride 0.9 % injection 3 mL  3 mL Intravenous Q12H Erline Levine, MD   3 mL at 04/14/15 1035  . sodium chloride 0.9 % injection 3 mL  3 mL Intravenous PRN Erline Levine, MD      . sodium phosphate (FLEET) 7-19 GM/118ML enema 1 enema  1 enema Rectal Once PRN Erline Levine, MD      . vitamin B-12 (CYANOCOBALAMIN) tablet 1,000 mcg  1,000 mcg Oral Daily Erline Levine, MD   1,000 mcg at 04/14/15 1038  . zolpidem (AMBIEN) tablet 5 mg  5 mg Oral QHS PRN Erline Levine, MD          Discharge Medications: Please see discharge summary for a list of discharge medications.  Relevant Imaging Results:  Relevant Lab Results:  Additional Information  (SSN: [id number])  Leane Call, Student-SW [phone]

## 2015-04-14 NOTE — Progress Notes (Signed)
Subjective: Patient reports "I feel better this morning"  Objective: Vital signs in last 24 hours: Temp:  [98.1 F (36.7 C)-98.9 F (37.2 C)] 98.2 F (36.8 C) (01/19 0655) Pulse Rate:  [88-113] 94 (01/19 0655) Resp:  [16-20] 20 (01/19 0655) BP: (128-168)/(72-97) 168/97 mmHg (01/19 0655) SpO2:  [97 %-100 %] 100 % (01/19 0655)  Intake/Output from previous day: 01/18 0701 - 01/19 0700 In: 3 [I.V.:3] Out: 1101 [Urine:1101] Intake/Output this shift:    Alert, eating breakfast. Reports only lumbar pain (mild) this morning, as expected. No chest pain. HR decreased (~86 reg to palp) this visit. Incision without erythema, swelling, or drainage beneath honeycomb and Dermabond. good strength BLE.  Lab Results:  Recent Labs  04/11/15 1448  WBC 8.0  HGB 12.0  HCT 35.8*  PLT 183   BMET  Recent Labs  04/11/15 1448  NA 136  K 4.1  CL 99*  CO2 27  GLUCOSE 104*  BUN 15  CREATININE 0.71  CALCIUM 9.4    Studies/Results: Dg Lumbar Spine 2-3 Views  04/12/2015  CLINICAL DATA:  L3-5 PLIF.  Intraoperative images. EXAM: DG C-ARM 61-120 MIN; LUMBAR SPINE - 2-3 VIEW COMPARISON:  MRI lumbar spine performed at an outside facility 11/02/2014. FINDINGS: We are provided with 2 fluoroscopic intraoperative spot views of the lumbar spine. Images demonstrate pedicle screws in place at L3, L4 and L5. Laminectomy defects are identified. No acute abnormality is seen. IMPRESSION: L3-5 PLIF in progress. Electronically Signed   By: Inge Rise M.D.   On: 04/12/2015 15:31   Dg C-arm 1-60 Min  04/12/2015  CLINICAL DATA:  L3-5 PLIF.  Intraoperative images. EXAM: DG C-ARM 61-120 MIN; LUMBAR SPINE - 2-3 VIEW COMPARISON:  MRI lumbar spine performed at an outside facility 11/02/2014. FINDINGS: We are provided with 2 fluoroscopic intraoperative spot views of the lumbar spine. Images demonstrate pedicle screws in place at L3, L4 and L5. Laminectomy defects are identified. No acute abnormality is seen.  IMPRESSION: L3-5 PLIF in progress. Electronically Signed   By: Inge Rise M.D.   On: 04/12/2015 15:31    Assessment/Plan: Improving   LOS: 2 days  Continue to mobilize in LSO with PT. Appreciate Cardiology's assistance.    Verdis Prime 04/14/2015, 8:23 AM

## 2015-04-15 DIAGNOSIS — I4892 Unspecified atrial flutter: Secondary | ICD-10-CM

## 2015-04-15 MED ORDER — DILTIAZEM HCL 30 MG PO TABS
30.0000 mg | ORAL_TABLET | Freq: Four times a day (QID) | ORAL | Status: DC
  Administered 2015-04-15 – 2015-04-16 (×4): 30 mg via ORAL
  Filled 2015-04-15 (×5): qty 1

## 2015-04-15 NOTE — Clinical Social Work Note (Signed)
Patient has a bed at SNF, [facility]. Patient completing admissions paperwork with facility now.  Patient to transition to SNF pending discharge instructions from MD.   CSW remains available as needed.   Glendon Axe, MSW, LCSWA [phone] 04/15/2015 10:11 AM

## 2015-04-15 NOTE — Progress Notes (Signed)
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 She reports onset of symptoms May 2016 after twisting. She was evaluated by her rheumatologist, on orthopedist, and an ortho spine specialist who recommended extensive lumbar surgery.She does note 2 falls recently, striking her head and upper back on concrete. Pt now s/p Lumbar Three-Five Decompression, Pedicle Screw Fixation, and Posteriolateral Arthrodesis (N/A) - L3-4 L4-5 Maximum access posterior lumbar fusion and resection of synovial cyst at L4-5 (04/12/15)    PT Comments    Patient making progress with mobility and gait.  Agree with need for SNF prior to discharge.  RN aware of HR from 103 up to 129 with mobility.  Follow Up Recommendations  SNF     Equipment Recommendations  None recommended by PT    Recommendations for Other Services       Precautions / Restrictions Precautions Precautions: Back Precaution Comments: Patient able to recall back precautions Required Braces or Orthoses: Spinal Brace Spinal Brace: Lumbar corset;Applied in sitting position Restrictions Weight Bearing Restrictions: No    Mobility  Bed Mobility Overal bed mobility: Needs Assistance Bed Mobility: Sit to Sidelying         Sit to sidelying: Min assist General bed mobility comments: Verbal cues for technique to maintain back precautions.  Assist to bring LE's on to bed to return to sidelying.  Assist to reposition in bed.  Transfers Overall transfer level: Needs assistance Equipment used: Rolling walker (2 wheeled) Transfers: Sit to/from Stand Sit to Stand: Min assist         General transfer comment: Assist to rise to standing from recliner.  Ambulation/Gait Ambulation/Gait assistance: Min  guard Ambulation Distance (Feet): 120 Feet Assistive device: Rolling walker (2 wheeled) Gait Pattern/deviations: Step-through pattern;Decreased stride length Gait velocity: decreased Gait velocity interpretation: Below normal speed for age/gender General Gait Details: Patient with HR 103 in chair.  Able to ambulate with RW with slow, guarded gait.  Max HR during gait was 129.   Stairs            Wheelchair Mobility    Modified Rankin (Stroke Patients Only)       Balance           Standing balance support: Bilateral upper extremity supported Standing balance-Leahy Scale: Poor                      Cognition Arousal/Alertness: Awake/alert Behavior During Therapy: WFL for tasks assessed/performed Overall Cognitive Status: Within Functional Limits for tasks assessed                      Exercises      General Comments General comments (skin integrity, edema, etc.): Attempted to see patient earlier in day.  HR 140 at rest in chair.  RN aware and providing meds.  Upon return, HR at 103.        Pertinent Vitals/Pain Pain Assessment: 0-10 Pain Score: 3  Pain Location: back Pain Descriptors / Indicators: Aching Pain Intervention(s): Monitored during session;Repositioned    Home Living                      Prior Function            PT Goals (current goals can now be found in the  care plan section) Progress towards PT goals: Progressing toward goals    Frequency  Min 5X/week    PT Plan Current plan remains appropriate    Co-evaluation             End of Session Equipment Utilized During Treatment: Gait belt;Back brace Activity Tolerance: Patient limited by fatigue Patient left: in bed;with call bell/phone within reach;with bed alarm set     Time: 1114-1131 PT Time Calculation (min) (ACUTE ONLY): 17 min  Charges:  $Gait Training: 8-22 mins                    G Codes:      Despina Pole 2015/05/01, 12:50 PM Carita Pian.  Sanjuana Kava, [place] Pager [phone]

## 2015-04-15 NOTE — Progress Notes (Signed)
Patient ID: Ceiara Vandell, female   DOB: 11/16/1931, 82 y.o.   MRN: [id number] Spoke with pts nurse for clarification of plan:  Cardiology will continue to follow for rate issues; if no further issues arise, expect Eliquis to start on 1/22, with potential transfer on Monday 1/23 to SNF; again, only if no new issues. Meanwhile, will continue to mobilize as tolerated with PT.   Verdis Prime RN BSN

## 2015-04-15 NOTE — Progress Notes (Signed)
Patient Name: Kathy Velez Date of Encounter: 04/15/2015   SUBJECTIVE HR again elevated today, despite increases in metoprolol XL to 50 mg daily. She ruled out for AMI. No chest pain.  CURRENT MEDS . aspirin EC  81 mg Oral Daily  . calcium carbonate  1 tablet Oral BID WC  . cholecalciferol  1,000 Units Oral Daily  . docusate sodium  100 mg Oral BID  . DULoxetine  20 mg Oral Daily  . famotidine  20 mg Oral Daily  . levothyroxine  75 mcg Oral QAC breakfast  . metoprolol succinate  50 mg Oral Daily  . multivitamin with minerals  1 tablet Oral Daily  . multivitamin-lutein  1 capsule Oral BID  . pantoprazole  40 mg Oral QHS  . pneumococcal 23 valent vaccine  0.5 mL Intramuscular Tomorrow-1000  . polycarbophil  625 mg Oral Daily  . sodium chloride  3 mL Intravenous Q12H  . vitamin B-12  1,000 mcg Oral Daily    OBJECTIVE  Filed Vitals:   04/14/15 2145 04/15/15 0244 04/15/15 0514 04/15/15 0911  BP: 152/81 162/98 149/88 140/86  Pulse: 51 132 128 134  Temp: 98.5 F (36.9 C) 98 F (36.7 C) 98.6 F (37 C) 97.8 F (36.6 C)  TempSrc: Oral Oral Oral Oral  Resp: 16 18 16 20   Height:      Weight:      SpO2: 94% 96% 92% 97%    Intake/Output Summary (Last 24 hours) at 04/15/15 1002 Last data filed at 04/15/15 0830  Gross per 24 hour  Intake    120 ml  Output      0 ml  Net    120 ml   Filed Weights   04/12/15 0833  Weight: 123 lb (55.792 kg)    PHYSICAL EXAM  General: Pleasant, elderly woman in NAD Neuro: Alert and oriented X 3. Moves all extremities spontaneously. Psych: Normal affect. HEENT:  Normal  Neck: Supple without bruits or JVD. Lungs:  Resp regular and unlabored, CTA. Heart:  Regular tachycardic no s3, s4, or murmurs. Abdomen: Soft, bad placed Extremities: No clubbing, cyanosis or edema. DP/PT/Radials 2+ and equal bilaterally.  Accessory Clinical Findings  CBC No results for input(s): WBC, NEUTROABS, HGB, HCT, MCV, PLT in the last 72 hours. Basic  Metabolic Panel No results for input(s): NA, K, CL, CO2, GLUCOSE, BUN, CREATININE, CALCIUM, MG, PHOS in the last 72 hours. Liver Function Tests No results for input(s): AST, ALT, ALKPHOS, BILITOT, PROT, ALBUMIN in the last 72 hours. No results for input(s): LIPASE, AMYLASE in the last 72 hours. Cardiac Enzymes  Recent Labs  04/13/15 2057 04/14/15 0053  TROPONINI <0.03 <0.03    TELE  Aflutter rate of 100-110s, transiently in 130s  Radiology/Studies  Dg Lumbar Spine 2-3 Views  04/12/2015  CLINICAL DATA:  L3-5 PLIF.  Intraoperative images. EXAM: DG C-ARM 61-120 MIN; LUMBAR SPINE - 2-3 VIEW COMPARISON:  MRI lumbar spine performed at an outside facility 11/02/2014. FINDINGS: We are provided with 2 fluoroscopic intraoperative spot views of the lumbar spine. Images demonstrate pedicle screws in place at L3, L4 and L5. Laminectomy defects are identified. No acute abnormality is seen. IMPRESSION: L3-5 PLIF in progress. Electronically Signed   By: Inge Rise M.D.   On: 04/12/2015 15:31   Dg C-arm 1-60 Min  04/12/2015  CLINICAL DATA:  L3-5 PLIF.  Intraoperative images. EXAM: DG C-ARM 61-120 MIN; LUMBAR SPINE - 2-3 VIEW COMPARISON:  MRI lumbar spine performed at an outside facility 11/02/2014.  FINDINGS: We are provided with 2 fluoroscopic intraoperative spot views of the lumbar spine. Images demonstrate pedicle screws in place at L3, L4 and L5. Laminectomy defects are identified. No acute abnormality is seen. IMPRESSION: L3-5 PLIF in progress. Electronically Signed   By: Inge Rise M.D.   On: 04/12/2015 15:31    ASSESSMENT AND PLAN  1. Aflutter with RVR (CHADSVASC 6) - rate is still high on Toprol XL 50 mg daily. Appears to be room in blood pressure for additional agent. Start cardizem 30 mg q6 hrs for additional rate control..  - Plan to start Eliquis 2.5mg  BID on 1/22 after discussion with Dr. Vertell Limber regarding post-operative bleeding risks versus stroke risk from a-fib. Can  discontinue aspirin at that time.  2. Chest pain - No further episode. Trop x 2 negative. EKG non ischemic. No need for ischemic evaluation.   S/P MVR (mitral valve repair) S/P TVR (tricuspid valve repair)   Pixie Casino, MD, [facility] Attending Cardiologist [place]

## 2015-04-15 NOTE — Progress Notes (Signed)
Subjective: Patient reports "I had some back pain inthe night, but I haven't had any chest pain"  Objective: Vital signs in last 24 hours: Temp:  [98 F (36.7 C)-98.7 F (37.1 C)] 98.6 F (37 C) (01/20 0514) Pulse Rate:  [51-132] 128 (01/20 0514) Resp:  [16-20] 16 (01/20 0514) BP: (138-172)/(78-98) 149/88 mmHg (01/20 0514) SpO2:  [92 %-98 %] 92 % (01/20 0514)  Intake/Output from previous day:   Intake/Output this shift: Total I/O In: 120 [P.O.:120] Out: -   Alert, conversant (bed bath in progress). HR again elevated, but pt asymptomatic. Strength is good, and she will continue to work with PT. She understands the need for anticoagulation, and the need to wait some time post-op (?1/22).     Lab Results: No results for input(s): WBC, HGB, HCT, PLT in the last 72 hours. BMET No results for input(s): NA, K, CL, CO2, GLUCOSE, BUN, CREATININE, CALCIUM in the last 72 hours.  Studies/Results: No results found.  Assessment/Plan:    LOS: 3 days  Appreciate cardiology's assistance. Continue to mobilize in LSO with PT as tolerated.    Verdis Prime 04/15/2015, 8:53 AM

## 2015-04-15 NOTE — Clinical Social Work Note (Signed)
Clinical Social Worker just received call from pt's dtr, Hosie Spangle who received a call from pt's consulting physician stating patient would likely not transition to SNF today.   CSW remains available as needed.  Glendon Axe, MSW, [place] [phone] 04/15/2015 10:25 AM

## 2015-04-15 NOTE — Clinical Social Work Placement (Signed)
   CLINICAL SOCIAL WORK PLACEMENT  NOTE  Date:  04/15/2015  Patient Details  Name: Kathy Velez MRN: [id number] Date of Birth: 03/30/1931  Clinical Social Work is seeking post-discharge placement for this patient at the [place] level of care (*CSW will initial, date and re-position this form in  chart as items are completed):  Yes   Patient/family provided with [place] Work Department's list of facilities offering this level of care within the geographic area requested by the patient (or if unable, by the patient's family).  Yes   Patient/family informed of their freedom to choose among providers that offer the needed level of care, that participate in Medicare, Medicaid or managed care program needed by the patient, have an available bed and are willing to accept the patient.  Yes   Patient/family informed of [facility]'s ownership interest in [facility] and [facility], as well as of the fact that they are under no obligation to receive care at these facilities.  PASRR submitted to EDS on 04/15/15     PASRR number received on 04/15/15     Existing PASRR number confirmed on       FL2 transmitted to all facilities in geographic area requested by pt/family on 04/14/15     FL2 transmitted to all facilities within larger geographic area on       Patient informed that his/her managed care company has contracts with or will negotiate with certain facilities, including the following:        Yes   Patient/family informed of bed offers received.  Patient chooses bed at  [facility] [place] )     Physician recommends and patient chooses bed at      Patient to be transferred to  [facility] and [place] ) on 04/15/15.  Patient to be transferred to facility by  (PTAR vs Car)     Patient family notified on 04/15/15 of transfer.  Name of family member notified:   (Pt's dtr, Di Kindle )     PHYSICIAN Please sign FL2,  Please prepare priority discharge summary, including medications     Additional Comment:    _______________________________________________ Rozell Searing, LCSW 04/15/2015, 10:15 AM

## 2015-04-15 NOTE — Care Management Important Message (Signed)
Important Message  Patient Details  Name: Kathy Velez MRN: [id number] Date of Birth: [birth year]   Medicare Important Message Given:  Yes    Wandell Scullion P Nadiah Corbit 04/15/2015, 11:50 AM

## 2015-04-16 LAB — URINALYSIS, ROUTINE W REFLEX MICROSCOPIC
Bilirubin Urine: NEGATIVE
Glucose, UA: NEGATIVE mg/dL
Hgb urine dipstick: NEGATIVE
Ketones, ur: 15 mg/dL — AB
Leukocytes, UA: NEGATIVE
Nitrite: NEGATIVE
Protein, ur: NEGATIVE mg/dL
Specific Gravity, Urine: 1.01 (ref 1.005–1.030)
pH: 7.5 (ref 5.0–8.0)

## 2015-04-16 MED ORDER — TAMSULOSIN HCL 0.4 MG PO CAPS
0.4000 mg | ORAL_CAPSULE | Freq: Every day | ORAL | Status: DC
  Administered 2015-04-16 – 2015-04-19 (×4): 0.4 mg via ORAL
  Filled 2015-04-16 (×4): qty 1

## 2015-04-16 MED ORDER — DILTIAZEM HCL 30 MG PO TABS
60.0000 mg | ORAL_TABLET | Freq: Four times a day (QID) | ORAL | Status: DC
  Administered 2015-04-16 – 2015-04-17 (×4): 60 mg via ORAL
  Filled 2015-04-16 (×4): qty 2

## 2015-04-16 MED ORDER — METHOCARBAMOL 500 MG PO TABS: 500.0000 mg | ORAL_TABLET | Freq: Four times a day (QID) | ORAL | Status: DC | PRN

## 2015-04-16 MED ORDER — MAGNESIUM CITRATE PO SOLN
0.5000 | Freq: Once | ORAL | Status: AC
  Administered 2015-04-16: 0.5 via ORAL
  Filled 2015-04-16: qty 296

## 2015-04-16 MED ORDER — HYDROCODONE-ACETAMINOPHEN 5-325 MG PO TABS: 1.0000 | ORAL_TABLET | Freq: Four times a day (QID) | ORAL | Status: DC | PRN

## 2015-04-16 NOTE — Clinical Social Work Placement (Signed)
   CLINICAL SOCIAL WORK PLACEMENT  NOTE  Date:  04/16/2015  Patient Details  Name: Kathy Velez MRN: [id number] Date of Birth: February 23, 1932  Clinical Social Work is seeking post-discharge placement for this patient at the [place] level of care (*CSW will initial, date and re-position this form in  chart as items are completed):  Yes   Patient/family provided with [place] Work Department's list of facilities offering this level of care within the geographic area requested by the patient (or if unable, by the patient's family).  Yes   Patient/family informed of their freedom to choose among providers that offer the needed level of care, that participate in Medicare, Medicaid or managed care program needed by the patient, have an available bed and are willing to accept the patient.  Yes   Patient/family informed of [facility]'s ownership interest in [facility] and [facility], as well as of the fact that they are under no obligation to receive care at these facilities.  PASRR submitted to EDS on 04/15/15     PASRR number received on 04/15/15     Existing PASRR number confirmed on       FL2 transmitted to all facilities in geographic area requested by pt/family on 04/14/15     FL2 transmitted to all facilities within larger geographic area on       Patient informed that his/her managed care company has contracts with or will negotiate with certain facilities, including the following:        Yes   Patient/family informed of bed offers received.  Patient chooses bed at  [facility] and [place] )     Physician recommends and patient chooses bed at      Patient to be transferred to  [facility] [place] ) on 04/16/15.  Patient to be transferred to facility by  (PTAR vs Car)     Patient family notified on 04/16/15 of transfer.  Name of family member notified:   (Pt's dtr, Di Kindle )     PHYSICIAN Please sign FL2,  Please prepare priority discharge summary, including medications     Additional Comment:    _______________________________________________ Rozell Searing, LCSW 04/16/2015, 11:31 AM

## 2015-04-16 NOTE — Progress Notes (Addendum)
Patient had five beat run of v tach at 2058 per tech, will continue to monitor at this time. Heart rate continues to be elevated when out of bed to void.

## 2015-04-16 NOTE — Consult Note (Signed)
Urology Consult  Referring physician: Ovidio Kin Reason for referral: Urinary incontinence, urgency and incomplete bladder emptying status post multilevel lumbar decompression for stenosis/spondylolithiesis.  History of Present Illness: Kathy Velez is currently 82 years young. She resides in [place] and her husband was a Curator in the area prior to his passing. She has no definitive urologic history but admits for some time she has had issues with urinary frequency, nocturia and urgency. She has had some close calls with regard urge incontinence. She came to [facility] to have neurosurgical intervention and be closer to family. Her procedure was performed 4 days ago. Since that time she has had issues with urinary urgency and urge incontinence. Bladder scans have been in the 350 mL to 500 mL range. She has had several in and out catheterizations. She underwent in out cath earlier today for urinalysis and culture and those results remain pending at this time. Since that time she has voided twice and I have asked the nursing services/tacks to perform a repeat postvoid residual via bladder scan now. She has not had a bowel movement since surgery.  Past Medical History  Diagnosis Date  . Dysrhythmia     PAF  . Arthritis     Back  . RA (rheumatoid arthritis) ([place])   . Hypothyroidism   . Hypertension   . Heart murmur   . Seizures ([place])     after Heart Surgey  . Stroke [facility])     TIA- found by neurologist after   . Anxiety   . GERD (gastroesophageal reflux disease)   . Constipation due to pain medication therapy     after heart surgery  . Restless leg   . Anemia   . Cancer [facility]) 2006    Colon.  Basal Cell Skin cancer- right arm   Past Surgical History  Procedure Laterality Date  . Mitral valve repair  01/20/13    Gore-tex cords to P1, P2, and P3. Magic suture to posterior medial commisure, #30 Physio 1 ring. Done in [place]  . Colon resection  2006    cancer  . Colonoscopy     . Eye surgery Bilateral     Cataract  . Abdominal hysterectomy      Partial   . Tricuspid valve surgery  01/20/13    #28 TriAd ring done in [place]  . Maximum access (mas)posterior lumbar interbody fusion (plif) 2 level N/A 04/12/2015    Procedure: Lumbar Three-Five Decompression, Pedicle Screw Fixation, and Posteriolateral Arthrodesis;  Surgeon: Erline Levine, MD;  Location: [facility];  Service: Neurosurgery;  Laterality: N/A;  L3-4 L4-5 Maximum access posterior lumbar fusion, possible interbodies and resection of synovial cyst at L4-5    Medications:  Scheduled: . aspirin EC  81 mg Oral Daily  . calcium carbonate  1 tablet Oral BID WC  . cholecalciferol  1,000 Units Oral Daily  . diltiazem  30 mg Oral 4 times per day  . docusate sodium  100 mg Oral BID  . DULoxetine  20 mg Oral Daily  . famotidine  20 mg Oral Daily  . levothyroxine  75 mcg Oral QAC breakfast  . metoprolol succinate  50 mg Oral Daily  . multivitamin with minerals  1 tablet Oral Daily  . multivitamin-lutein  1 capsule Oral BID  . pantoprazole  40 mg Oral QHS  . pneumococcal 23 valent vaccine  0.5 mL Intramuscular Tomorrow-1000  . polycarbophil  625 mg Oral Daily  . sodium chloride  3 mL Intravenous Q12H  .  vitamin B-12  1,000 mcg Oral Daily    Allergies:  Allergies  Allergen Reactions  . Codeine Other (See Comments)    "just don't take it well"  . Bee Venom Swelling and Rash    Family History  Problem Relation Age of Onset  . Vaginal cancer Mother   . Alcohol abuse Son     Social History:  reports that she has never smoked. She does not have any smokeless tobacco history on file. She reports that she drinks about 0.6 oz of alcohol per week. She reports that she does not use illicit drugs.  ROS positive for some mild postsurgical discomfort, urinary urgency, urge incontinence, intermittent bladder pressure, constipation. Otherwise negative. Patient denies any dysuria or gross hematuria.  Physical  Exam:  Vital signs in last 24 hours: Temp:  [97.6 F (36.4 C)-98.6 F (37 C)] 97.6 F (36.4 C) (01/21 1037) Pulse Rate:  [53-107] 107 (01/21 1037) Resp:  [20] 20 (01/21 1037) BP: (143-160)/(71-92) 149/71 mmHg (01/21 1037) SpO2:  [94 %-100 %] 100 % (01/21 1037)  Constitutional: Vital signs reviewed. WD WN in NAD Head: Normocephalic and atraumatic   Eyes: PERRL, No scleral icterus.  Neck: Supple No  Gross JVD, mass Cardiovascular: RRR Pulmonary/Chest: Normal effort Abdominal: Soft. Non-tender, non-distended, bowel sounds are normal, no masses, organomegaly, or guarding present.  Genitourinary: Not examined Extremities: No cyanosis or edema  Neurological: Grossly non-focal.  Skin: Warm,very dry and intact. No rash, cyanosis   Laboratory Data:  Results for orders placed or performed during the hospital encounter of 04/12/15 (from the past 72 hour(s))  Troponin I     Status: None   Collection Time: 04/13/15  8:57 PM  Result Value Ref Range   Troponin I <0.03 <0.031 ng/mL    Comment:        NO INDICATION OF MYOCARDIAL INJURY.   Troponin I     Status: None   Collection Time: 04/14/15 12:53 AM  Result Value Ref Range   Troponin I <0.03 <0.031 ng/mL    Comment:        NO INDICATION OF MYOCARDIAL INJURY.   Urinalysis, Routine w reflex microscopic (not at [facility])     Status: Abnormal   Collection Time: 04/16/15 10:33 AM  Result Value Ref Range   Color, Urine YELLOW YELLOW   APPearance CLEAR CLEAR   Specific Gravity, Urine 1.010 1.005 - 1.030   pH 7.5 5.0 - 8.0   Glucose, UA NEGATIVE NEGATIVE mg/dL   Hgb urine dipstick NEGATIVE NEGATIVE   Bilirubin Urine NEGATIVE NEGATIVE   Ketones, ur 15 (A) NEGATIVE mg/dL   Protein, ur NEGATIVE NEGATIVE mg/dL   Nitrite NEGATIVE NEGATIVE   Leukocytes, UA NEGATIVE NEGATIVE    Comment: MICROSCOPIC NOT DONE ON URINES WITH NEGATIVE PROTEIN, BLOOD, LEUKOCYTES, NITRITE, OR GLUCOSE <1000 mg/dL.   Recent Results (from the past 240 hour(s))   Surgical pcr screen     Status: None   Collection Time: 04/11/15  2:48 PM  Result Value Ref Range Status   MRSA, PCR NEGATIVE NEGATIVE Final   Staphylococcus aureus NEGATIVE NEGATIVE Final    Comment:        The Xpert SA Assay (FDA approved for NASAL specimens in patients over 29 years of age), is one component of a comprehensive surveillance program.  Test performance has been validated by [facility] for patients greater than or equal to 10 year old. It is not intended to diagnose infection nor to guide or monitor treatment.  Creatinine:  Recent Labs  04/11/15 1448  CREATININE 0.71   Baseline Creatinine:   Impression/Assessment:  Increased urinary urgency and urge incontinence and incomplete bladder emptying status post lumbar spinal surgery. This is most likely multifactorial.  Catheterized urinalysis shows no evidence of cystitis. I would not recommend indwelling Foley catheter at this time but that would be necessary if a decision is made to transfer her to a skilled nursing facility within the next day or 2. I would recommend ongoing monitoring of her post void residual with bladder scan and in and out cath when necessary residuals greater than 300 mL or as needed secondary to pressure and/or discomfort. Ambulation and treatment of the constipation should help. Cannot completely rule out the possibility of neurogenic bladder but I think this is more likely to be typical post operative effects. Will start some temporary tamsulosin to try to improve bladder emptying.  Plan:  As above  Kross Swallows S 04/16/2015, 11:52 AM

## 2015-04-16 NOTE — Progress Notes (Signed)
Filed Vitals:   04/15/15 1636 04/15/15 2121 04/16/15 0122 04/16/15 0505  BP: 143/87 160/92 152/88 159/87  Pulse: 88 94 92 101  Temp: 98.5 F (36.9 C) 98.3 F (36.8 C) 98.3 F (36.8 C) 98.6 F (37 C)  TempSrc: Oral Oral Oral Oral  Resp: 20 20 20 20   Height:      Weight:      SpO2: 97% 94% 95% 100%    Patient complaining of urinary urgency and incontinence. Patient's nurse, Claiborne Billings, reports recent PVR this morning by bladder scan of 350 mL. Did require in and out catheterization earlier during the postoperative period.  Plan: We'll have patient's nurse do another in and out catheterization now, and send urine for urinalysis and urine culture and sensitivity. Have called and spoken with Dr. Rana Snare from urology service, and requested urology consultation regarding urgency and incontinence.  Hosie Spangle, MD 04/16/2015, 9:41 AM

## 2015-04-16 NOTE — Progress Notes (Signed)
Patient Name: Kathy Velez Date of Encounter: 04/16/2015   SUBJECTIVE HR again elevated today, despite increases in metoprolol XL to 50 mg daily and diltiazem. She ruled out for AMI. No chest pain.  Feeling well.  CURRENT MEDS . aspirin EC  81 mg Oral Daily  . calcium carbonate  1 tablet Oral BID WC  . cholecalciferol  1,000 Units Oral Daily  . diltiazem  30 mg Oral 4 times per day  . docusate sodium  100 mg Oral BID  . DULoxetine  20 mg Oral Daily  . famotidine  20 mg Oral Daily  . levothyroxine  75 mcg Oral QAC breakfast  . metoprolol succinate  50 mg Oral Daily  . multivitamin with minerals  1 tablet Oral Daily  . multivitamin-lutein  1 capsule Oral BID  . pantoprazole  40 mg Oral QHS  . pneumococcal 23 valent vaccine  0.5 mL Intramuscular Tomorrow-1000  . polycarbophil  625 mg Oral Daily  . sodium chloride  3 mL Intravenous Q12H  . tamsulosin  0.4 mg Oral Daily  . vitamin B-12  1,000 mcg Oral Daily    OBJECTIVE  Filed Vitals:   04/15/15 2121 04/16/15 0122 04/16/15 0505 04/16/15 1037  BP: 160/92 152/88 159/87 149/71  Pulse: 94 92 101 107  Temp: 98.3 F (36.8 C) 98.3 F (36.8 C) 98.6 F (37 C) 97.6 F (36.4 C)  TempSrc: Oral Oral Oral Oral  Resp: 20 20 20 20   Height:      Weight:      SpO2: 94% 95% 100% 100%    Intake/Output Summary (Last 24 hours) at 04/16/15 1224 Last data filed at 04/15/15 1806  Gross per 24 hour  Intake    360 ml  Output      0 ml  Net    360 ml   Filed Weights   04/12/15 0833  Weight: 123 lb (55.792 kg)    PHYSICAL EXAM  General: Pleasant, elderly woman in NAD Neuro: Alert and oriented X 3. Moves all extremities spontaneously. Psych: Normal affect. HEENT:  Normal  Neck: Supple without bruits or JVD. Lungs:  Resp regular and unlabored, CTA. Heart:  Regular tachycardic no s3, s4, or murmurs. Abdomen: Soft, bad placed Extremities: No clubbing, cyanosis or edema. DP/PT/Radials 2+ and equal bilaterally.  Accessory Clinical  Findings  CBC No results for input(s): WBC, NEUTROABS, HGB, HCT, MCV, PLT in the last 72 hours. Basic Metabolic Panel No results for input(s): NA, K, CL, CO2, GLUCOSE, BUN, CREATININE, CALCIUM, MG, PHOS in the last 72 hours. Liver Function Tests No results for input(s): AST, ALT, ALKPHOS, BILITOT, PROT, ALBUMIN in the last 72 hours. No results for input(s): LIPASE, AMYLASE in the last 72 hours. Cardiac Enzymes  Recent Labs  04/13/15 2057 04/14/15 0053  TROPONINI <0.03 <0.03    TELE  Aflutter rate of 100-110s, transiently in 130s  Radiology/Studies  Dg Lumbar Spine 2-3 Views  04/12/2015  CLINICAL DATA:  L3-5 PLIF.  Intraoperative images. EXAM: DG C-ARM 61-120 MIN; LUMBAR SPINE - 2-3 VIEW COMPARISON:  MRI lumbar spine performed at an outside facility 11/02/2014. FINDINGS: We are provided with 2 fluoroscopic intraoperative spot views of the lumbar spine. Images demonstrate pedicle screws in place at L3, L4 and L5. Laminectomy defects are identified. No acute abnormality is seen. IMPRESSION: L3-5 PLIF in progress. Electronically Signed   By: Inge Rise M.D.   On: 04/12/2015 15:31   Dg C-arm 1-60 Min  04/12/2015  CLINICAL DATA:  L3-5 PLIF.  Intraoperative images. EXAM: DG C-ARM 61-120 MIN; LUMBAR SPINE - 2-3 VIEW COMPARISON:  MRI lumbar spine performed at an outside facility 11/02/2014. FINDINGS: We are provided with 2 fluoroscopic intraoperative spot views of the lumbar spine. Images demonstrate pedicle screws in place at L3, L4 and L5. Laminectomy defects are identified. No acute abnormality is seen. IMPRESSION: L3-5 PLIF in progress. Electronically Signed   By: Inge Rise M.D.   On: 04/12/2015 15:31    ASSESSMENT AND PLAN  1. Aflutter with RVR (CHADSVASC 6) - rate is still high on Toprol XL 50 mg daily. Appears to be room in blood pressure for additional agent. Started on cardizem 30 mg q6 hrs for additional rate control.  HR still elevated, Shraddha Lebron increase cardiazem to 60  mg. - Plan to start Eliquis 2.5mg  BID on 1/22 after discussion with Dr. Vertell Limber regarding post-operative bleeding risks versus stroke risk from a-fib. Can discontinue aspirin at that time.  2. Chest pain - No further episode. Trop x 2 negative. EKG non ischemic. No need for ischemic evaluation.   S/P MVR (mitral valve repair) S/P TVR (tricuspid valve repair)   Keiri Solano Meredith Leeds, MD 04/16/2015 12:25 PM

## 2015-04-16 NOTE — Clinical Social Work Note (Addendum)
Discharge canceled. [facility] aware.  Patient complaining of urinary urgency and incontinence. Patient will remain inpatient at this time.   CSW will remains available as needed.   Glendon Axe, MSW, LCSWA [phone] 04/16/2015 11:31 AM

## 2015-04-16 NOTE — Discharge Summary (Signed)
Physician Discharge Summary  Patient ID: Kathy Velez MRN: [id number] DOB/AGE: [birth year] 82 y.o.  Admit date: 04/12/2015 Discharge date: 04/16/2015  Admission Diagnoses: Lumbar stenosis with instability    Discharge Diagnoses: Same   Discharged Condition: good  Hospital Course: The patient was admitted on 04/12/2015 and taken to the operating room where the patient underwent lumbar decompression and fusion. The patient tolerated the procedure well and was taken to the recovery room and then to the floor in stable condition. The hospital course was routine. There were no complications. The wound remained clean dry and intact. Pt had appropriate back soreness. No complaints of leg pain or new N/T/W. The patient remained afebrile with stable vital signs, and tolerated a regular diet. The patient continued to increase activities, and pain was well controlled with oral pain medications.   Consults: None  Significant Diagnostic Studies:  Results for orders placed or performed during the hospital encounter of 04/12/15  Troponin I  Result Value Ref Range   Troponin I <0.03 <0.031 ng/mL  Troponin I  Result Value Ref Range   Troponin I <0.03 <0.031 ng/mL    Dg Lumbar Spine 2-3 Views  04/12/2015  CLINICAL DATA:  L3-5 PLIF.  Intraoperative images. EXAM: DG C-ARM 61-120 MIN; LUMBAR SPINE - 2-3 VIEW COMPARISON:  MRI lumbar spine performed at an outside facility 11/02/2014. FINDINGS: We are provided with 2 fluoroscopic intraoperative spot views of the lumbar spine. Images demonstrate pedicle screws in place at L3, L4 and L5. Laminectomy defects are identified. No acute abnormality is seen. IMPRESSION: L3-5 PLIF in progress. Electronically Signed   By: Inge Rise M.D.   On: 04/12/2015 15:31   Dg C-arm 1-60 Min  04/12/2015  CLINICAL DATA:  L3-5 PLIF.  Intraoperative images. EXAM: DG C-ARM 61-120 MIN; LUMBAR SPINE - 2-3 VIEW COMPARISON:  MRI lumbar spine performed at an outside facility  11/02/2014. FINDINGS: We are provided with 2 fluoroscopic intraoperative spot views of the lumbar spine. Images demonstrate pedicle screws in place at L3, L4 and L5. Laminectomy defects are identified. No acute abnormality is seen. IMPRESSION: L3-5 PLIF in progress. Electronically Signed   By: Inge Rise M.D.   On: 04/12/2015 15:31    Antibiotics:  Anti-infectives    Start     Dose/Rate Route Frequency Ordered Stop   04/12/15 1930  ceFAZolin (ANCEF) IVPB 1 g/50 mL premix     1 g 100 mL/hr over 30 Minutes Intravenous Every 8 hours 04/12/15 1721 04/13/15 0338   04/12/15 0600  ceFAZolin (ANCEF) IVPB 2 g/50 mL premix     2 g 100 mL/hr over 30 Minutes Intravenous To [facility] 04/11/15 1400 04/12/15 1141      Discharge Exam: Blood pressure 159/87, pulse 101, temperature 98.6 F (37 C), temperature source Oral, resp. rate 20, height 5' 2.5" (1.588 m), weight 55.792 kg (123 lb), SpO2 100 %. Neurologic: Grossly normal Dressing dry  Discharge Medications:     Medication List    TAKE these medications        aspirin EC 81 MG tablet  Take 81 mg by mouth daily.     calcium carbonate 1500 (600 Ca) MG Tabs tablet  Commonly known as:  OSCAL  Take 600 mg of elemental calcium by mouth 2 (two) times daily with a meal.     cholecalciferol 1000 units tablet  Commonly known as:  VITAMIN D  Take 1,000 Units by mouth daily.     diclofenac sodium 1 % Gel  Commonly known  as:  VOLTAREN  Apply 4 g topically 4 (four) times daily.     DULoxetine 20 MG capsule  Commonly known as:  CYMBALTA  Take 20 mg by mouth daily.     Fish Oil 1000 MG Caps  Take 1,000 mg by mouth daily.     HYDROcodone-acetaminophen 5-325 MG tablet  Commonly known as:  NORCO/VICODIN  Take 1-2 tablets by mouth every 6 (six) hours as needed for moderate pain.     hydroxychloroquine 200 MG tablet  Commonly known as:  PLAQUENIL  Take 200 mg by mouth daily.     leflunomide 20 MG tablet  Commonly known as:   ARAVA  Take 20 mg by mouth daily.     levothyroxine 75 MCG tablet  Commonly known as:  SYNTHROID, LEVOTHROID  Take 75 mcg by mouth daily before breakfast.     methocarbamol 500 MG tablet  Commonly known as:  ROBAXIN  Take 1 tablet (500 mg total) by mouth every 6 (six) hours as needed for muscle spasms.     metoprolol succinate 25 MG 24 hr tablet  Commonly known as:  TOPROL-XL  Take 25 mg by mouth daily.     multivitamin with minerals Tabs tablet  Take 1 tablet by mouth daily.     polycarbophil 625 MG tablet  Commonly known as:  FIBERCON  Take 625 mg by mouth daily.     PRESERVISION AREDS 2 Caps  Take 1 Dose by mouth 2 (two) times daily.     ranitidine 150 MG capsule  Commonly known as:  ZANTAC  Take 150 mg by mouth daily.     vitamin B-12 1000 MCG tablet  Commonly known as:  CYANOCOBALAMIN  Take 1,000 mcg by mouth daily.        Disposition: Skilled nursing facility   Final Dx: Number decompression and fusion      Discharge Instructions    Call MD for:  difficulty breathing, headache or visual disturbances    Complete by:  As directed      Call MD for:  persistant nausea and vomiting    Complete by:  As directed      Call MD for:  redness, tenderness, or signs of infection (pain, swelling, redness, odor or green/yellow discharge around incision site)    Complete by:  As directed      Call MD for:  severe uncontrolled pain    Complete by:  As directed      Call MD for:  temperature >100.4    Complete by:  As directed      Diet - low sodium heart healthy    Complete by:  As directed      Discharge instructions    Complete by:  As directed   No bending twisting or heavy lifting, may shower     Increase activity slowly    Complete by:  As directed               Signed: JONES,DAVID S 04/16/2015, 10:36 AM

## 2015-04-16 NOTE — Progress Notes (Signed)
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 She reports onset of symptoms May 2016 after twisting. She was evaluated by her rheumatologist, on orthopedist, and an ortho spine specialist who recommended extensive lumbar surgery.She does note 2 falls recently, striking her head and upper back on concrete. Pt now s/p Lumbar Three-Five Decompression, Pedicle Screw Fixation, and Posteriolateral Arthrodesis (N/A) - L3-4 L4-5 Maximum access posterior lumbar fusion and resection of synovial cyst at L4-5 (04/12/15)    PT Comments    Pt less tachy during mobility today. HR mostly stayed around 112 with a brief increase to 120.   Follow Up Recommendations  SNF     Equipment Recommendations  None recommended by PT    Recommendations for Other Services       Precautions / Restrictions Precautions Precautions: Back Precaution Comments: Patient able to recall back precautions Required Braces or Orthoses: Spinal Brace Spinal Brace: Lumbar corset;Applied in sitting position    Mobility  Bed Mobility     Rolling: Supervision Sidelying to sit: Min assist       General bed mobility comments: verbal/tactile cues for logroll. Assist to elevate trunk.  Transfers   Equipment used: Rolling walker (2 wheeled)   Sit to Stand: Min assist         General transfer comment: verbal cues for hand placement.  Ambulation/Gait Ambulation/Gait assistance: Min guard Ambulation Distance (Feet): 180 Feet Assistive device: Rolling walker (2 wheeled) Gait Pattern/deviations: Step-through pattern;Decreased stride length Gait velocity: decreased   General Gait Details: slow, guarded gait. HR max during ambulation was 120.   Stairs            Wheelchair  Mobility    Modified Rankin (Stroke Patients Only)       Balance                                    Cognition Arousal/Alertness: Awake/alert Behavior During Therapy: WFL for tasks assessed/performed Overall Cognitive Status: Within Functional Limits for tasks assessed                      Exercises      General Comments        Pertinent Vitals/Pain Pain Assessment: 0-10 Pain Score: 3  Pain Location: sx site Pain Descriptors / Indicators: Aching Pain Intervention(s): Monitored during session;Repositioned    Home Living                      Prior Function            PT Goals (current goals can now be found in the care plan section) Acute Rehab PT Goals Patient Stated Goal: to go to rehab before returning to her I living apartment in [place] PT Goal Formulation: With patient/family Time For Goal Achievement: 04/20/15 Potential to Achieve Goals: Good Progress towards PT goals: Progressing toward goals    Frequency  Min 5X/week    PT Plan Current plan remains appropriate    Co-evaluation             End of Session Equipment Utilized During Treatment: Gait belt;Back brace Activity Tolerance: Patient tolerated treatment well Patient left: in chair;with family/visitor present;with call bell/phone within reach  Time: [id number] PT Time Calculation (min) (ACUTE ONLY): 26 min  Charges:  $Gait Training: 23-37 mins                    G Codes:      Lorriane Shire 04/16/2015, 2:53 PM

## 2015-04-17 LAB — URINE CULTURE
Culture: NO GROWTH
Special Requests: NORMAL

## 2015-04-17 MED ORDER — DILTIAZEM HCL ER COATED BEADS 120 MG PO CP24
300.0000 mg | ORAL_CAPSULE | Freq: Every day | ORAL | Status: DC
  Administered 2015-04-17: 300 mg via ORAL
  Filled 2015-04-17 (×2): qty 1

## 2015-04-17 MED ORDER — PROSIGHT PO TABS
1.0000 | ORAL_TABLET | Freq: Two times a day (BID) | ORAL | Status: DC
  Administered 2015-04-18 – 2015-04-19 (×3): 1 via ORAL
  Filled 2015-04-17 (×5): qty 1

## 2015-04-17 MED ORDER — APIXABAN 2.5 MG PO TABS
2.5000 mg | ORAL_TABLET | Freq: Two times a day (BID) | ORAL | Status: DC
  Administered 2015-04-17 – 2015-04-19 (×5): 2.5 mg via ORAL
  Filled 2015-04-17 (×5): qty 1

## 2015-04-17 NOTE — Progress Notes (Signed)
5 Days Post-Op Subjective: Patient reports an overall improvement. She has been able to void. Apparently she does have continued elevated post for residuals but I was unable to find documentation of the exact value in the chart. She has voided several times this morning. She does continue to have some urgency. She has had a bowel movement. She has been able to ambulate.  Objective: Vital signs in last 24 hours: Temp:  [98.1 F (36.7 C)-98.5 F (36.9 C)] 98.1 F (36.7 C) (01/22 1031) Pulse Rate:  [52-106] 106 (01/22 1031) Resp:  [18-20] 18 (01/22 1031) BP: (107-160)/(56-86) 132/73 mmHg (01/22 1031) SpO2:  [94 %-96 %] 96 % (01/22 1031)  Intake/Output from previous day:   Intake/Output this shift:    Physical Exam:  Constitutional: Vital signs reviewed. WD WN in NAD   Eyes: PERRL, No scleral icterus.   Cardiovascular: RRR Pulmonary/Chest: Normal effort Extremities: No cyanosis or edema   Lab Results: No results for input(s): HGB, HCT in the last 72 hours. BMET No results for input(s): NA, K, CL, CO2, GLUCOSE, BUN, CREATININE, CALCIUM in the last 72 hours. No results for input(s): LABPT, INR in the last 72 hours. No results for input(s): LABURIN in the last 72 hours. Results for orders placed or performed during the hospital encounter of 04/11/15  Surgical pcr screen     Status: None   Collection Time: 04/11/15  2:48 PM  Result Value Ref Range Status   MRSA, PCR NEGATIVE NEGATIVE Final   Staphylococcus aureus NEGATIVE NEGATIVE Final    Comment:        The Xpert SA Assay (FDA approved for NASAL specimens in patients over 35 years of age), is one component of a comprehensive surveillance program.  Test performance has been validated by [facility] for patients greater than or equal to 91 year old. It is not intended to diagnose infection nor to guide or monitor treatment.     Studies/Results: No results found.  Assessment/Plan:  Urinary frequency urgency urge  incontinence and documented incomplete bladder emptying. Subjectively getting better. Urinalysis was unremarkable. I have asked nursing services to check a repeat bladder scan postvoid residual after her next void.   LOS: 5 days   Micky Sheller S 04/17/2015, 10:45 AM

## 2015-04-17 NOTE — Progress Notes (Signed)
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 She reports onset of symptoms May 2016 after twisting. She was evaluated by her rheumatologist, on orthopedist, and an ortho spine specialist who recommended extensive lumbar surgery.She does note 2 falls recently, striking her head and upper back on concrete. Pt now s/p Lumbar Three-Five Decompression, Pedicle Screw Fixation, and Posteriolateral Arthrodesis (N/A) - L3-4 L4-5 Maximum access posterior lumbar fusion and resection of synovial cyst at L4-5 (04/12/15)    PT Comments    Plan is for d/c to SNF tomorrow.  Follow Up Recommendations  SNF     Equipment Recommendations  None recommended by PT    Recommendations for Other Services       Precautions / Restrictions Precautions Precautions: Back Precaution Comments: Patient able to recall back precautions Required Braces or Orthoses: Spinal Brace Spinal Brace: Lumbar corset;Applied in sitting position    Mobility  Bed Mobility     Rolling: Supervision Sidelying to sit: Min assist       General bed mobility comments: verbal/tactile cues for logroll. Assist to elevate trunk.  Transfers   Equipment used: Rolling walker (2 wheeled)   Sit to Stand: Min guard         General transfer comment: verbal cues for hand placement.  Ambulation/Gait Ambulation/Gait assistance: Min guard Ambulation Distance (Feet): 250 Feet Assistive device: Rolling walker (2 wheeled) Gait Pattern/deviations: Step-through pattern;Decreased stride length   Gait velocity interpretation: Below normal speed for age/gender General Gait Details: decreased, HR max 138 during ambulation. Returned to 112 at rest in recliner.   Stairs            Wheelchair  Mobility    Modified Rankin (Stroke Patients Only)       Balance                                    Cognition Arousal/Alertness: Awake/alert Behavior During Therapy: WFL for tasks assessed/performed Overall Cognitive Status: Within Functional Limits for tasks assessed                      Exercises      General Comments        Pertinent Vitals/Pain Pain Score: 3  Pain Location: Low back and hips Pain Descriptors / Indicators: Shooting Pain Intervention(s): Monitored during session;Repositioned    Home Living                      Prior Function            PT Goals (current goals can now be found in the care plan section) Acute Rehab PT Goals Patient Stated Goal: to go to rehab before returning to her I living apartment in [place] PT Goal Formulation: With patient/family Time For Goal Achievement: 04/20/15 Potential to Achieve Goals: Good Progress towards PT goals: Progressing toward goals    Frequency  Min 5X/week    PT Plan Current plan remains appropriate    Co-evaluation             End of Session Equipment Utilized During Treatment: Gait belt;Back brace Activity Tolerance: Patient tolerated treatment well Patient left: in chair;with call bell/phone within reach     Time: [phone]  PT Time Calculation (min) (ACUTE ONLY): 27 min  Charges:  $Gait Training: 23-37 mins                    G Codes:      Lorriane Shire 04/17/2015, 4:18 PM

## 2015-04-17 NOTE — Progress Notes (Signed)
Patient ID: Kathy Velez, female   DOB: 04-Dec-1931, 82 y.o.   MRN: [id number] Subjective: Patient reports she continues to improve slowly. No leg pain  Objective: Vital signs in last 24 hours: Temp:  [97.6 F (36.4 C)-98.5 F (36.9 C)] 98.4 F (36.9 C) (01/22 0546) Pulse Rate:  [52-107] 77 (01/22 0546) Resp:  [18-20] 18 (01/22 0546) BP: (107-160)/(56-86) 127/56 mmHg (01/22 0546) SpO2:  [94 %-100 %] 95 % (01/22 0546)  Intake/Output from previous day:   Intake/Output this shift:    Neurologic: Grossly normal  Lab Results: Lab Results  Component Value Date   WBC 8.0 04/11/2015   HGB 12.0 04/11/2015   HCT 35.8* 04/11/2015   MCV 103.5* 04/11/2015   PLT 183 04/11/2015   No results found for: INR, PROTIME BMET Lab Results  Component Value Date   NA 136 04/11/2015   K 4.1 04/11/2015   CL 99* 04/11/2015   CO2 27 04/11/2015   GLUCOSE 104* 04/11/2015   BUN 15 04/11/2015   CREATININE 0.71 04/11/2015   CALCIUM 9.4 04/11/2015    Studies/Results: No results found.  Assessment/Plan: Continue current management. Cardiology following. Appreciate urology input.   LOS: 5 days    JONES,DAVID S 04/17/2015, 10:02 AM

## 2015-04-17 NOTE — Progress Notes (Signed)
Patient Name: Donye Eldredge Date of Encounter: 04/17/2015   SUBJECTIVE Feeling well today with only complaint of back pain when standing.  CURRENT MEDS . aspirin EC  81 mg Oral Daily  . calcium carbonate  1 tablet Oral BID WC  . cholecalciferol  1,000 Units Oral Daily  . diltiazem  60 mg Oral 4 times per day  . docusate sodium  100 mg Oral BID  . DULoxetine  20 mg Oral Daily  . famotidine  20 mg Oral Daily  . levothyroxine  75 mcg Oral QAC breakfast  . metoprolol succinate  50 mg Oral Daily  . multivitamin with minerals  1 tablet Oral Daily  . multivitamin-lutein  1 capsule Oral BID  . pantoprazole  40 mg Oral QHS  . pneumococcal 23 valent vaccine  0.5 mL Intramuscular Tomorrow-1000  . polycarbophil  625 mg Oral Daily  . sodium chloride  3 mL Intravenous Q12H  . tamsulosin  0.4 mg Oral Daily  . vitamin B-12  1,000 mcg Oral Daily    OBJECTIVE  Filed Vitals:   04/17/15 0145 04/17/15 0546 04/17/15 1027 04/17/15 1031  BP: 107/57 127/56  132/73  Pulse: 102 77 89 106  Temp: 98.5 F (36.9 C) 98.4 F (36.9 C)  98.1 F (36.7 C)  TempSrc: Oral Oral  Oral  Resp: 18 18  18   Height:      Weight:      SpO2: 95% 95%  96%   No intake or output data in the 24 hours ending 04/17/15 1416 Filed Weights   04/12/15 0833  Weight: 123 lb (55.792 kg)    PHYSICAL EXAM  General: Pleasant, elderly woman in NAD Neuro: Alert and oriented X 3. Moves all extremities spontaneously. Psych: Normal affect. HEENT:  Normal  Neck: Supple without bruits or JVD. Lungs:  Resp regular and unlabored, CTA. Heart:  Regular tachycardic no s3, s4, or murmurs. Abdomen: Soft, bad placed Extremities: No clubbing, cyanosis or edema. DP/PT/Radials 2+ and equal bilaterally.  Accessory Clinical Findings  CBC No results for input(s): WBC, NEUTROABS, HGB, HCT, MCV, PLT in the last 72 hours. Basic Metabolic Panel No results for input(s): NA, K, CL, CO2, GLUCOSE, BUN, CREATININE, CALCIUM, MG, PHOS in the  last 72 hours. Liver Function Tests No results for input(s): AST, ALT, ALKPHOS, BILITOT, PROT, ALBUMIN in the last 72 hours. No results for input(s): LIPASE, AMYLASE in the last 72 hours. Cardiac Enzymes No results for input(s): CKTOTAL, CKMB, CKMBINDEX, TROPONINI in the last 72 hours.  TELE  Aflutter rate of 100-110s, transiently in 130s  Radiology/Studies  Dg Lumbar Spine 2-3 Views  04/12/2015  CLINICAL DATA:  L3-5 PLIF.  Intraoperative images. EXAM: DG C-ARM 61-120 MIN; LUMBAR SPINE - 2-3 VIEW COMPARISON:  MRI lumbar spine performed at an outside facility 11/02/2014. FINDINGS: We are provided with 2 fluoroscopic intraoperative spot views of the lumbar spine. Images demonstrate pedicle screws in place at L3, L4 and L5. Laminectomy defects are identified. No acute abnormality is seen. IMPRESSION: L3-5 PLIF in progress. Electronically Signed   By: Inge Rise M.D.   On: 04/12/2015 15:31   Dg C-arm 1-60 Min  04/12/2015  CLINICAL DATA:  L3-5 PLIF.  Intraoperative images. EXAM: DG C-ARM 61-120 MIN; LUMBAR SPINE - 2-3 VIEW COMPARISON:  MRI lumbar spine performed at an outside facility 11/02/2014. FINDINGS: We are provided with 2 fluoroscopic intraoperative spot views of the lumbar spine. Images demonstrate pedicle screws in place at L3, L4 and L5. Laminectomy defects are  identified. No acute abnormality is seen. IMPRESSION: L3-5 PLIF in progress. Electronically Signed   By: Inge Rise M.D.   On: 04/12/2015 15:31    ASSESSMENT AND PLAN  1. Aflutter with RVR (CHADSVASC 6) - Rate control improved with metoprolol and diltiazem.  Andres Bantz change diltiazem to oral to see if this controls her prior to discharge. - Plan to start Eliquis 2.5mg  BID today and d/c aspirin. 2. Chest pain - No further episode. Trop x 2 negative. EKG non ischemic. No need for ischemic evaluation.   S/P MVR (mitral valve repair) S/P TVR (tricuspid valve repair)   Shamecca Whitebread Meredith Leeds, MD 04/17/2015 2:16  PM

## 2015-04-18 MED ORDER — APIXABAN 2.5 MG PO TABS: 2.5000 mg | ORAL_TABLET | Freq: Two times a day (BID) | ORAL | Status: DC

## 2015-04-18 MED ORDER — METHOCARBAMOL 500 MG PO TABS: 500.0000 mg | ORAL_TABLET | Freq: Four times a day (QID) | ORAL | Status: DC | PRN

## 2015-04-18 MED ORDER — DILTIAZEM HCL ER COATED BEADS 300 MG PO CP24: 300.0000 mg | ORAL_CAPSULE | Freq: Every day | ORAL | Status: DC

## 2015-04-18 MED ORDER — TAMSULOSIN HCL 0.4 MG PO CAPS: 0.4000 mg | ORAL_CAPSULE | Freq: Every day | ORAL | Status: DC

## 2015-04-18 MED ORDER — DILTIAZEM HCL ER COATED BEADS 180 MG PO CP24
180.0000 mg | ORAL_CAPSULE | Freq: Every day | ORAL | Status: DC
  Administered 2015-04-19: 180 mg via ORAL
  Filled 2015-04-18: qty 1

## 2015-04-18 MED ORDER — DILTIAZEM HCL 30 MG PO TABS
60.0000 mg | ORAL_TABLET | Freq: Three times a day (TID) | ORAL | Status: AC
  Administered 2015-04-18 (×3): 60 mg via ORAL
  Filled 2015-04-18 (×3): qty 2

## 2015-04-18 MED ORDER — HYDROCODONE-ACETAMINOPHEN 5-325 MG PO TABS: 1.0000 | ORAL_TABLET | ORAL | Status: DC | PRN

## 2015-04-18 NOTE — Progress Notes (Signed)
Patient Name: Kathy Velez Date of Encounter: 04/18/2015  Active Problems:   Spondylolisthesis of lumbar region   Atrial flutter (HCC)   Chest pain at rest   S/P MVR (mitral valve repair)   S/P TVR (tricuspid valve repair)   Elective surgery   Primary Cardiologist: Dr Debara Pickett  Patient Profile: 82 yo female w/ hx PAF, MV repair w/ (2014 with Gore-Tex cords and 30 mm physio-ring) and tricuspid repair (28 mm Triad ring) as well as essential hypertension, RA, history of seizures and TIA, RLS, anemia and colon cancer. Admitted   SUBJECTIVE: Feeling a little better every day, no palpitations, CP/SOB  OBJECTIVE Filed Vitals:   04/17/15 1910 04/17/15 2234 04/18/15 0613 04/18/15 0952  BP: 92/54 106/66 98/51 109/65  Pulse: 106 86 84 92  Temp: 98.3 F (36.8 C) 98.4 F (36.9 C) 98.2 F (36.8 C) 98.5 F (36.9 C)  TempSrc: Oral Oral Oral Oral  Resp: 18 18 16 17   Height:      Weight:      SpO2: 96% 98% 95% 98%    Intake/Output Summary (Last 24 hours) at 04/18/15 1004 Last data filed at 04/18/15 0500  Gross per 24 hour  Intake      0 ml  Output    660 ml  Net   -660 ml   Filed Weights   04/12/15 0833  Weight: 123 lb (55.792 kg)    PHYSICAL EXAM General: Well developed, well nourished, female in no acute distress. Head: Normocephalic, atraumatic.  Neck: Supple without bruits, JVD not elevated. Lungs:  Resp regular and unlabored, CTA. Heart: Irregular R&R, S1, S2, no S3, S4, or murmur; no rub. Abdomen: Soft, non-tender, non-distended, BS + x 4.  Extremities: No clubbing, cyanosis, edema.  Neuro: Alert and oriented X 3. Moves all extremities spontaneously. Psych: Normal affect.  LABS: None  TELE:   Atrial fib, flutter, RVR at times, probably 2nd activity     Current Medications:  . apixaban  2.5 mg Oral BID  . aspirin EC  81 mg Oral Daily  . calcium carbonate  1 tablet Oral BID WC  . cholecalciferol  1,000 Units Oral Daily  . diltiazem  300 mg Oral Daily  .  docusate sodium  100 mg Oral BID  . DULoxetine  20 mg Oral Daily  . famotidine  20 mg Oral Daily  . levothyroxine  75 mcg Oral QAC breakfast  . metoprolol succinate  50 mg Oral Daily  . multivitamin  1 tablet Oral BID  . multivitamin with minerals  1 tablet Oral Daily  . pantoprazole  40 mg Oral QHS  . pneumococcal 23 valent vaccine  0.5 mL Intramuscular Tomorrow-1000  . polycarbophil  625 mg Oral Daily  . sodium chloride  3 mL Intravenous Q12H  . tamsulosin  0.4 mg Oral Daily  . vitamin B-12  1,000 mcg Oral Daily   . sodium chloride 250 mL (04/13/15 1537)  . dextrose 5 % and 0.45 % NaCl with KCl 20 mEq/L 75 mL/hr at 04/14/15 0120    ASSESSMENT AND PLAN: Active Problems:   Spondylolisthesis of lumbar region - per NS    Atrial flutter ([place]) - only on Toprol XL 25 mg at home - changed short-acting rx to Cardizem CD 300 mg 01/22, got 01/22 pm  - had only gotten 180 mg of Cardizem in a day prior to that - has been on Toprol XL 50 mg qd since 01/20, no doses missed  - SBP  90s last pm, much lower than previous - will change Cardizem to short acting 60 mg tid for today, go to CD 180 mg tomorrow, give at lunch    Chest pain at rest - trop negative - no ischemic eval indicated    S/P MVR (mitral valve repair)   S/P TVR (tricuspid valve repair) - follow - get echo as OP    Elective surgery - per NS  Signed, Barrett, Rhonda , PA-C 10:04 AM 04/18/2015 As above, patient seen and examined; no chest pain or dyspnea; plan to continue toprol and cardizem for rate control; change cardizem to CD in AM if HR stable on telemetry; continue apixaban; DC ASA; DCCV in 4 weeks if atrial flutter persists. Patient can be DCed in AM if HR stable overnight. Kirk Ruths

## 2015-04-18 NOTE — Care Management Important Message (Signed)
Important Message  Patient Details  Name: Kathy Velez MRN: [id number] Date of Birth: Dec 09, 1931   Medicare Important Message Given:  Yes    Nathen May 04/18/2015, 3:40 PM

## 2015-04-18 NOTE — Progress Notes (Signed)
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 She reports onset of symptoms May 2016 after twisting. She was evaluated by her rheumatologist, on orthopedist, and an ortho spine specialist who recommended extensive lumbar surgery.She does note 2 falls recently, striking her head and upper back on concrete. Pt now s/p Lumbar Three-Five Decompression, Pedicle Screw Fixation, and Posteriolateral Arthrodesis (N/A) - L3-4 L4-5 Maximum access posterior lumbar fusion and resection of synovial cyst at L4-5 (04/12/15)    PT Comments    Patient making gradual progress with mobility, ambulating 300 feet with rw and min guard/supervision assistance. Cues needed for bed mobility and transfers as well as min assist with donning brace. PT to continue to follow and progress mobility as tolerated.   Follow Up Recommendations  SNF;Supervision for mobility/OOB     Equipment Recommendations  None recommended by PT (to be addressed at next venue)    Recommendations for Other Services       Precautions / Restrictions Precautions Precautions: Back Required Braces or Orthoses: Spinal Brace Spinal Brace: Lumbar corset;Applied in sitting position Restrictions Weight Bearing Restrictions: No    Mobility  Bed Mobility Overal bed mobility: Needs Assistance Bed Mobility: Rolling;Sidelying to Sit Rolling: Supervision Sidelying to sit: Supervision       General bed mobility comments: cues for log roll technique  Transfers Overall transfer level: Needs assistance Equipment used: Rolling walker (2 wheeled) Transfers: Sit to/from Stand Sit to Stand: Min guard         General transfer comment: verbal cues for hand placement.  Ambulation/Gait Ambulation/Gait  assistance: Min guard;Supervision Ambulation Distance (Feet): 300 Feet   Gait Pattern/deviations: Step-through pattern Gait velocity: decreased   General Gait Details: even pattern, no loss of balance   Stairs            Wheelchair Mobility    Modified Rankin (Stroke Patients Only)       Balance Overall balance assessment: Needs assistance Sitting-balance support: No upper extremity supported Sitting balance-Leahy Scale: Good     Standing balance support: Bilateral upper extremity supported Standing balance-Leahy Scale: Poor Standing balance comment: using rw                    Cognition Arousal/Alertness: Awake/alert Behavior During Therapy: WFL for tasks assessed/performed Overall Cognitive Status: Within Functional Limits for tasks assessed                      Exercises      General Comments        Pertinent Vitals/Pain Pain Assessment: No/denies pain (while at rest)    Home Living                      Prior Function            PT Goals (current goals can now be found in the care plan section) Acute Rehab PT Goals Patient Stated Goal: Be able to move better with less pain PT Goal Formulation: With patient/family Time For Goal Achievement: 04/20/15 Potential to Achieve Goals: Good Progress towards PT goals: Progressing toward goals    Frequency  Min 5X/week    PT Plan Current plan remains appropriate    Co-evaluation             End  of Session Equipment Utilized During Treatment: Gait belt;Back brace Activity Tolerance: Patient tolerated treatment well Patient left: in chair;with call bell/phone within reach     Time: 0926-0946 PT Time Calculation (min) (ACUTE ONLY): 20 min  Charges:  $Gait Training: 8-22 mins                    G Codes:      Cassell Clement, PT, CSCS Pager [phone] Office [phone]  04/18/2015, 9:51 AM

## 2015-04-18 NOTE — Progress Notes (Signed)
Subjective: Patient reports "I feel better. I'm still having some trouble with my bladder"  Objective: Vital signs in last 24 hours: Temp:  [98.1 F (36.7 C)-98.9 F (37.2 C)] 98.2 F (36.8 C) (01/23 [id number]) Pulse Rate:  [84-106] 84 (01/23 0613) Resp:  [16-18] 16 (01/23 0613) BP: (92-132)/(51-73) 98/51 mmHg (01/23 0613) SpO2:  [95 %-98 %] 95 % (01/23 [id number])  Intake/Output from previous day: 01/22 0701 - 01/23 0700 In: -  Out: 660 [Urine:660] Intake/Output this shift:    Alert, conversant. Reports minimal lumbar pain, no leg pain. Strength good BLE, ambulated in hallway yesterday. Incision without erythema, swelling, or drainage beneath honeycomb & Dermabond.  Some post-void residual persists, having been evaluated by Urology (Dr. Risa Grill). HR improved today, 108 at present. (116 walking with PT at time of note) Cardiology following.  Started Eliquis yesterday and rate meds were adjusted.  Lab Results: No results for input(s): WBC, HGB, HCT, PLT in the last 72 hours. BMET No results for input(s): NA, K, CL, CO2, GLUCOSE, BUN, CREATININE, CALCIUM in the last 72 hours.  Studies/Results: No results found.  Assessment/Plan: Improving   LOS: 6 days  Continue to mobilize. Ok to transfer to SNF when deemed appropriate by cardiology.  Appreciate assistance of Urology and Cardiology.    Verdis Prime 04/18/2015, 9:31 AM

## 2015-04-18 NOTE — Clinical Social Work Note (Signed)
Per consulting MD, patient currently not medically stable for discharge. Patient has a bed at [facility] once medically stable.   Facility informed. CSW remains available as needed.   Glendon Axe, MSW, LCSWA [phone] 04/18/2015 5:22 PM

## 2015-04-19 MED ORDER — MIRABEGRON ER 25 MG PO TB24
25.0000 mg | ORAL_TABLET | Freq: Every day | ORAL | Status: DC
  Administered 2015-04-19: 25 mg via ORAL
  Filled 2015-04-19: qty 1

## 2015-04-19 NOTE — Clinical Social Work Placement (Cosign Needed)
   CLINICAL SOCIAL WORK PLACEMENT  NOTE  Date:  04/19/2015  Patient Details  Name: Kathy Velez MRN: [id number] Date of Birth: 11/29/1931  Clinical Social Work is seeking post-discharge placement for this patient at the [place] level of care (*CSW will initial, date and re-position this form in  chart as items are completed):  Yes   Patient/family provided with [place] Work Department's list of facilities offering this level of care within the geographic area requested by the patient (or if unable, by the patient's family).  Yes   Patient/family informed of their freedom to choose among providers that offer the needed level of care, that participate in Medicare, Medicaid or managed care program needed by the patient, have an available bed and are willing to accept the patient.  Yes   Patient/family informed of [facility]'s ownership interest in [facility] and [facility], as well as of the fact that they are under no obligation to receive care at these facilities.  PASRR submitted to EDS on 04/15/15     PASRR number received on 04/15/15     Existing PASRR number confirmed on       FL2 transmitted to all facilities in geographic area requested by pt/family on 04/14/15     FL2 transmitted to all facilities within larger geographic area on       Patient informed that his/her managed care company has contracts with or will negotiate with certain facilities, including the following:        Yes   Patient/family informed of bed offers received.  Patient chooses bed at  [facility] and [facility] )     Physician recommends and patient chooses bed at      Patient to be transferred to  [facility] ) on 04/19/15.  Patient to be transferred to facility by  ([facility] vs Car)     Patient family notified on 04/19/15 of transfer.  Name of family member notified:   (Pt's dtr, Di Kindle )     PHYSICIAN        Additional Comment:    _______________________________________________ Leane Call, Student-SW 04/19/2015, 2:16 PM

## 2015-04-19 NOTE — Discharge Summary (Signed)
Physician Discharge Summary  Patient ID: Kathy Velez MRN: [id number] DOB/AGE: 80-Nov-1933 80 y.o.  Admit date: 04/12/2015 Discharge date: 04/19/2015  Admission Diagnoses: Lumbar stenosis, spondylolisthesis, Synovial cyst, Neurogenic claudication, Rheumatoid arthritis L 34 and L 45 levels   Discharge Diagnoses: Lumbar stenosis, spondylolisthesis, Synovial cyst, Neurogenic claudication, Rheumatoid arthritis L 34 and L 45 levels s/p Lumbar Three-Five Decompression, Pedicle Screw Fixation, and Posteriolateral Arthrodesis (N/A) - L3-4 L4-5 Maximum access posterior lumbar fusion and resection of synovial cyst at L4-5  Active Problems:   Spondylolisthesis of lumbar region   Atrial flutter (HCC)   Chest pain at rest   S/P MVR (mitral valve repair)   S/P TVR (tricuspid valve repair)   Elective surgery   Discharged Condition: good  Hospital Course: Kimmarie Brieske was admitted for surgery with dx stenosis, spondylolisthesis, and radiculopathy. Following uncomplicated MAS PLIF [id number], L4-5 and resection of synovial cyst at L4-5, she recovered well and transferred to [facility] for nursing care and therapies. She developed atrial flutter and was attended by cardiology for rate correction by medications. She continued to progress from lumbar surgery standpoint, with good strength and improved pain levels. Urinary urgency, incomplete bladder emptying was also noticed during her stay and managed with medications. Presently, some urge incontinence persists. She is ambulating and pain is well-controlled.  Consults: cardiology and urology  Significant Diagnostic Studies: radiology: X-Ray: intra-op  Treatments: surgery: Lumbar Three-Five Decompression, Pedicle Screw Fixation, and Posteriolateral Arthrodesis (N/A) - L3-4 L4-5 Maximum access posterior lumbar fusion and resection of synovial cyst at L4-5   Discharge Exam: Blood pressure 127/72, pulse 78, temperature 99 F (37.2 C), temperature source Oral, resp. rate 16,  height 5' 2.5" (1.588 m), weight 55.792 kg (123 lb), SpO2 99 %. Alert, conversant. Good strength BLE. Some bruising at sacrum and buttock, not unexpected. Incision without bruising, swelling, drainage, or erythema. HR now at 104, irregular (improved and still asymptomatic). BM today.   Disposition: Discharge to SNF, [facility]. Follow up with Dr, Risa Grill in a few weeks, DrStern in a few weeks, and her cardiologist in [place] as well. Atrial flutter ([place]) From Dr. Stanford Breed - Cardiology:- Telemetry personally reviewed. Heart rate is reasonably well controlled. Continue metoprolol and Cardizem CD. Continue apixaban. Following rehabilitation patient should follow-up with her cardiologist in [place]. Can consider proceeding with cardioversion 3 weeks after patient has been on anticoagulation. DC ASA From Dr. Risa Grill: Add some Myrbetriq 25 mg a day to her regimen. This is for bladder overactivity urgency and urge incontinence. As opposed to traditional anticholinergic medication this will not inhibit bladder contractility. She will need follow-up in our office in the next few weeks.  From NS standpoint, pt verbalizes understanding of d/c instructions. She will return to see DrStern as scheduled. Norco & Robaxin for PRN use.    Discharge Instructions    Call MD for:  difficulty breathing, headache or visual disturbances    Complete by:  As directed      Call MD for:  persistant nausea and vomiting    Complete by:  As directed      Call MD for:  redness, tenderness, or signs of infection (pain, swelling, redness, odor or green/yellow discharge around incision site)    Complete by:  As directed      Call MD for:  severe uncontrolled pain    Complete by:  As directed      Call MD for:  temperature >100.4    Complete by:  As directed  Diet - low sodium heart healthy    Complete by:  As directed      Discharge instructions    Complete by:  As directed   No bending twisting or heavy lifting, may  shower     Increase activity slowly    Complete by:  As directed             Medication List    TAKE these medications        apixaban 2.5 MG Tabs tablet  Commonly known as:  ELIQUIS  Take 1 tablet (2.5 mg total) by mouth 2 (two) times daily.     aspirin EC 81 MG tablet  Take 81 mg by mouth daily.     calcium carbonate 1500 (600 Ca) MG Tabs tablet  Commonly known as:  OSCAL  Take 600 mg of elemental calcium by mouth 2 (two) times daily with a meal.     cholecalciferol 1000 units tablet  Commonly known as:  VITAMIN D  Take 1,000 Units by mouth daily.     diclofenac sodium 1 % Gel  Commonly known as:  VOLTAREN  Apply 4 g topically 4 (four) times daily.     diltiazem 300 MG 24 hr capsule  Commonly known as:  CARDIZEM CD  Take 1 capsule (300 mg total) by mouth daily.     DULoxetine 20 MG capsule  Commonly known as:  CYMBALTA  Take 20 mg by mouth daily.     Fish Oil 1000 MG Caps  Take 1,000 mg by mouth daily.     HYDROcodone-acetaminophen 5-325 MG tablet  Commonly known as:  NORCO/VICODIN  Take 1-2 tablets by mouth every 6 (six) hours as needed for moderate pain.     HYDROcodone-acetaminophen 5-325 MG tablet  Commonly known as:  NORCO/VICODIN  Take 1-2 tablets by mouth every 4 (four) hours as needed (mild pain).     hydroxychloroquine 200 MG tablet  Commonly known as:  PLAQUENIL  Take 200 mg by mouth daily.     leflunomide 20 MG tablet  Commonly known as:  ARAVA  Take 20 mg by mouth daily.     levothyroxine 75 MCG tablet  Commonly known as:  SYNTHROID, LEVOTHROID  Take 75 mcg by mouth daily before breakfast.     methocarbamol 500 MG tablet  Commonly known as:  ROBAXIN  Take 1 tablet (500 mg total) by mouth every 6 (six) hours as needed for muscle spasms.     metoprolol succinate 25 MG 24 hr tablet  Commonly known as:  TOPROL-XL  Take 25 mg by mouth daily.     multivitamin with minerals Tabs tablet  Take 1 tablet by mouth daily.     polycarbophil 625  MG tablet  Commonly known as:  FIBERCON  Take 625 mg by mouth daily.     PRESERVISION AREDS 2 Caps  Take 1 Dose by mouth 2 (two) times daily.     ranitidine 150 MG capsule  Commonly known as:  ZANTAC  Take 150 mg by mouth daily.     tamsulosin 0.4 MG Caps capsule  Commonly known as:  FLOMAX  Take 1 capsule (0.4 mg total) by mouth daily.     vitamin B-12 1000 MCG tablet  Commonly known as:  CYANOCOBALAMIN  Take 1,000 mcg by mouth daily.           Follow-up Information    Follow up with [facility] .   Specialty:  [place]  Contact information:   [address] [place] [phone]      Signed: Verdis Prime 04/19/2015, 1:39 PM

## 2015-04-19 NOTE — Progress Notes (Signed)
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 She reports onset of symptoms May 2016 after twisting. She was evaluated by her rheumatologist, on orthopedist, and an ortho spine specialist who recommended extensive lumbar surgery.She does note 2 falls recently, striking her head and upper back on concrete. Pt now s/p Lumbar Three-Five Decompression, Pedicle Screw Fixation, and Posteriolateral Arthrodesis (N/A) - L3-4 L4-5 Maximum access posterior lumbar fusion and resection of synovial cyst at L4-5 (04/12/15)   OT comments  Pt progressing. Education provided in session. Plan is to d/c to SNF.  Follow Up Recommendations  SNF    Equipment Recommendations  Other (comment) (defer to next venue)    Recommendations for Other Services      Precautions / Restrictions Precautions Precautions: Back Precaution Booklet Issued: No Precaution Comments: reviewed back precautions with pt Required Braces or Orthoses: Spinal Brace Spinal Brace: Lumbar corset (already on in session; adjusted in standing) Restrictions Weight Bearing Restrictions: No       Mobility Bed Mobility               General bed mobility comments: not assessed  Transfers Overall transfer level: Needs assistance   Transfers: Sit to/from Stand Sit to Stand: Supervision (stood without RW in front of her from recliner chair; OT retrieved and placed in front of pt)              Balance    Able to assist OT with functional task while standing, with RW in front without assist to correct balance.                                 ADL Overall ADL's : Needs assistance/impaired                 Upper Body Dressing : Minimal assistance;Standing (adjusted back  brace)   Lower Body Dressing: Supervision/safety;Sitting/lateral leans Lower Body Dressing Details (indicate cue type and reason): donned/doffed socks Toilet Transfer: Supervision/safety;Ambulation;RW (also set up for RW)   Toileting- Clothing Manipulation and Hygiene: Maximal assistance;Sit to/from stand       Functional mobility during ADLs: Supervision/safety;Rolling walker (and set up for RW) General ADL Comments: Discussed incorporating precautions into functional activities. Educated on LB ADL technique as well as talked about AE. Educated on what pt could use for toilet aid for hygiene. OT assisted in donning/doffing pt's diaper.      Vision                     Perception     Praxis      Cognition  Awake/Alert Behavior During Therapy: WFL for tasks assessed/performed Overall Cognitive Status: Within Functional Limits for tasks assessed                       Extremity/Trunk Assessment               Exercises     Shoulder Instructions       General Comments      Pertinent Vitals/ Pain       Pain Assessment: 0-10 Pain Score: 8  Pain Location: legs, back, bottom Pain Intervention(s): Monitored during session   HR up to 130s in session, but would  come down.  Home Living                                          Prior Functioning/Environment              Frequency Min 2X/week     Progress Toward Goals  OT Goals(current goals can now be found in the care plan section)  Progress towards OT goals: Progressing toward goals-updated a goal due to progress  Acute Rehab OT Goals Patient Stated Goal: not stated OT Goal Formulation: With patient Time For Goal Achievement: 04/28/15 Potential to Achieve Goals: Good ADL Goals Pt Will Perform Grooming: with supervision;standing Pt Will Perform Lower Body Bathing: with min assist;sitting/lateral leans;sit to/from stand Pt Will Perform Lower Body Dressing: with min  assist;sitting/lateral leans;sit to/from stand Pt Will Transfer to Toilet: ambulating;with set-up (3 in 1 over commode) Pt Will Perform Toileting - Clothing Manipulation and hygiene: with supervision;sitting/lateral leans;sit to/from stand Pt Will Perform Tub/Shower Transfer: Shower transfer;with supervision;ambulating;shower seat;rolling walker;grab bars Additional ADL Goal #1: Pt will independently don/doff back brace to increase independence with ADLs and mobility. Additional ADL Goal #2: Pt will demonstrate adherence to 3/3 back precautions to increase safety with ADLs and mobility.  Plan Discharge plan remains appropriate    Co-evaluation                 End of Session Equipment Utilized During Treatment: Back brace;Rolling walker   Activity Tolerance Patient tolerated treatment well;Other (comment) (increased HR)   Patient Left in chair;with call bell/phone within reach;with family/visitor present   Nurse Communication          Time: [id number] OT Time Calculation (min): 21 min  Charges: OT General Charges $OT Visit: 1 Procedure OT Treatments $Self Care/Home Management : 8-22 mins  Kathy Velez OTR/L [id number] 04/19/2015, 12:36 PM

## 2015-04-19 NOTE — Progress Notes (Signed)
7 Days Post-Op Subjective: Patient reports ongoing issues with urinary urgency and urge incontinence. Her last several bladder scans have shown postvoid residuals in the 100-200 mL range. Not appear to be having any great difficulty actually voiding but her biggest complaint is the urgency and urge incontinence. It is unclear if tamsulosin is really helped her in any way. Urine culture was negative.  Objective: Vital signs in last 24 hours: Temp:  [98.3 F (36.8 C)-98.8 F (37.1 C)] 98.3 F (36.8 C) (01/24 0523) Pulse Rate:  [80-135] 135 (01/24 0523) Resp:  [17-18] 18 (01/24 0523) BP: (109-133)/(63-68) 133/65 mmHg (01/24 0523) SpO2:  [95 %-98 %] 95 % (01/24 0523)  Intake/Output from previous day: 01/23 0701 - 01/24 0700 In: 6 [I.V.:6] Out: -  Intake/Output this shift:    Physical Exam:  Constitutional: Vital signs reviewed. WD WN in NAD   Eyes: PERRL, No scleral icterus.   Cardiovascular: RRR Pulmonary/Chest: Normal effort Abdominal: Soft. Non-tender, non-distended, bowel sounds are normal, no masses, organomegaly, or guarding present.  Genitourinary: Not examined Extremities: No cyanosis or edema   Lab Results: No results for input(s): HGB, HCT in the last 72 hours. BMET No results for input(s): NA, K, CL, CO2, GLUCOSE, BUN, CREATININE, CALCIUM in the last 72 hours. No results for input(s): LABPT, INR in the last 72 hours. No results for input(s): LABURIN in the last 72 hours. Results for orders placed or performed during the hospital encounter of 04/12/15  Culture, Urine     Status: None   Collection Time: 04/16/15 10:33 AM  Result Value Ref Range Status   Specimen Description URINE, CLEAN CATCH  Final   Special Requests Normal  Final   Culture NO GROWTH 1 DAY  Final   Report Status 04/17/2015 FINAL  Final    Studies/Results: No results found.  Assessment/Plan:   Ongoing issues with urinary urgency and urge incontinence. Again this is probably multifactorial.  She has had a combination of urge incontinence as well as incomplete bladder emptying. She does seem to be emptying her bladder more completely on the alpha blocker but is had if anything more frequency urgency and urge leakage. Had some Myrbetriq 25 mg a day to her regimen. This is for bladder overactivity urgency and urge incontinence. As opposed to traditional anticholinergic medication this will not inhibit bladder contractility. She will need follow-up in our office in the next few weeks.   LOS: 7 days   Shawnte Demarest S 04/19/2015, 9:33 AM

## 2015-04-19 NOTE — Progress Notes (Signed)
Subjective: Patient reports "i'm doing ok"  Objective: Vital signs in last 24 hours: Temp:  [98.3 F (36.8 C)-98.8 F (37.1 C)] 98.3 F (36.8 C) (01/24 0523) Pulse Rate:  [80-135] 135 (01/24 0523) Resp:  [17-18] 18 (01/24 0523) BP: (109-133)/(63-68) 133/65 mmHg (01/24 0523) SpO2:  [95 %-98 %] 95 % (01/24 0523)  Intake/Output from previous day: 01/23 0701 - 01/24 0700 In: 6 [I.V.:6] Out: -  Intake/Output this shift:    Alert, smiling. Reports no back pain, no leg pain. HR improved overall per nursing (135 rate at 5am not her norm last 12hrs); currently 108 after up to [facility].  Incision without erythema, swelling, or drainage. Strength is good. Currently, pt's primary concern is her urge incontinence.  She verbalizes understanding of UTI risk with Foley and the hope that with rate regulation and increased stamina over the next few days, she will be able to be up to BSC/bathroom on her own.  Lab Results: No results for input(s): WBC, HGB, HCT, PLT in the last 72 hours. BMET No results for input(s): NA, K, CL, CO2, GLUCOSE, BUN, CREATININE, CALCIUM in the last 72 hours.  Studies/Results: No results found.  Assessment/Plan: Improving   LOS: 7 days  Likely d/c to SNF this afternoon, if appropriate from Cardiology's standpoint.    Verdis Prime 04/19/2015, 7:47 AM

## 2015-04-19 NOTE — Progress Notes (Signed)
Patient being transferred to [facility] place IV removed she is alert and oriented, pain is controled will be transferred by PTAR will call report.

## 2015-04-19 NOTE — Progress Notes (Addendum)
     Patient Name: Kathy Velez Date of Encounter: 04/19/2015  Active Problems:   Spondylolisthesis of lumbar region   Atrial flutter (HCC)   Chest pain at rest   S/P MVR (mitral valve repair)   S/P TVR (tricuspid valve repair)   Elective surgery   Primary Cardiologist: Dr Debara Pickett  SUBJECTIVE: No chest pain or dyspnea  OBJECTIVE Filed Vitals:   04/18/15 1858 04/18/15 2112 04/19/15 0523 04/19/15 0946  BP: 121/67 121/68 133/65 127/72  Pulse: 83 80 135 78  Temp: 98.4 F (36.9 C) 98.8 F (37.1 C) 98.3 F (36.8 C) 99 F (37.2 C)  TempSrc: Oral Oral Oral Oral  Resp: 18 18 18 16   Height:      Weight:      SpO2:  97% 95% 99%    Intake/Output Summary (Last 24 hours) at 04/19/15 1240 Last data filed at 04/19/15 1000  Gross per 24 hour  Intake    123 ml  Output      0 ml  Net    123 ml   Filed Weights   04/12/15 0833  Weight: 123 lb (55.792 kg)    PHYSICAL EXAM General: Well developed, well nourished, female in no acute distress. Head: Normal Neck: Supple  Lungs:  CTA. Heart: Irregular  Abdomen: Soft, non-tender, non-distended Extremities: No edema.  Neuro: Alert and oriented X 3. Moves all extremities spontaneously. Psych: Normal affect.  LABS: None   Current Medications:  . apixaban  2.5 mg Oral BID  . aspirin EC  81 mg Oral Daily  . calcium carbonate  1 tablet Oral BID WC  . cholecalciferol  1,000 Units Oral Daily  . diltiazem  180 mg Oral Daily  . docusate sodium  100 mg Oral BID  . DULoxetine  20 mg Oral Daily  . famotidine  20 mg Oral Daily  . levothyroxine  75 mcg Oral QAC breakfast  . metoprolol succinate  50 mg Oral Daily  . mirabegron ER  25 mg Oral Daily  . multivitamin  1 tablet Oral BID  . multivitamin with minerals  1 tablet Oral Daily  . pantoprazole  40 mg Oral QHS  . pneumococcal 23 valent vaccine  0.5 mL Intramuscular Tomorrow-1000  . polycarbophil  625 mg Oral Daily  . sodium chloride  3 mL Intravenous Q12H  . tamsulosin  0.4 mg  Oral Daily  . vitamin B-12  1,000 mcg Oral Daily   . sodium chloride 250 mL (04/13/15 1537)  . dextrose 5 % and 0.45 % NaCl with KCl 20 mEq/L 75 mL/hr at 04/14/15 0120    ASSESSMENT AND PLAN: Active Problems:   Spondylolisthesis of lumbar region - per NS    Atrial flutter ([place]) - Telemetry personally reviewed. Heart rate is reasonably well controlled. Continue metoprolol and Cardizem CD. Continue apixaban. Following rehabilitation patient should follow-up with her cardiologist in [place]. Can consider proceeding with cardioversion 3 weeks after patient has been on anticoagulation. DC ASA    Chest pain at rest - trop negative - no ischemic eval indicated    S/P MVR (mitral valve repair)   S/P TVR (tricuspid valve repair) - get echo as OO Patient can be discharged from a cardiology standpoint. Please call with questions.  Signed, Kirk Ruths , MD  12:40 PM 04/19/2015

## 2015-04-21 ENCOUNTER — Encounter: Payer: Self-pay | Admitting: Internal Medicine

## 2015-04-21 ENCOUNTER — Non-Acute Institutional Stay (SKILLED_NURSING_FACILITY): Payer: Medicare Other | Admitting: Internal Medicine

## 2015-04-21 DIAGNOSIS — M4806 Spinal stenosis, lumbar region: Secondary | ICD-10-CM | POA: Diagnosis not present

## 2015-04-21 DIAGNOSIS — E538 Deficiency of other specified B group vitamins: Secondary | ICD-10-CM | POA: Diagnosis not present

## 2015-04-21 DIAGNOSIS — M159 Polyosteoarthritis, unspecified: Secondary | ICD-10-CM | POA: Diagnosis not present

## 2015-04-21 DIAGNOSIS — K5901 Slow transit constipation: Secondary | ICD-10-CM | POA: Diagnosis not present

## 2015-04-21 DIAGNOSIS — R3915 Urgency of urination: Secondary | ICD-10-CM | POA: Diagnosis not present

## 2015-04-21 DIAGNOSIS — I4892 Unspecified atrial flutter: Secondary | ICD-10-CM | POA: Diagnosis not present

## 2015-04-21 DIAGNOSIS — E038 Other specified hypothyroidism: Secondary | ICD-10-CM

## 2015-04-21 DIAGNOSIS — M069 Rheumatoid arthritis, unspecified: Secondary | ICD-10-CM

## 2015-04-21 DIAGNOSIS — M48062 Spinal stenosis, lumbar region with neurogenic claudication: Secondary | ICD-10-CM

## 2015-04-21 NOTE — Progress Notes (Signed)
Patient ID: Kathy Velez, female   DOB: [birth year], 82 y.o.   MRN: [id number]   [facility]   PCP: No primary care provider on file.  Code Status: Full Code  Allergies  Allergen Reactions  . Codeine Other (See Comments)    "just don't take it well"  . Bee Venom Swelling and Rash    Chief Complaint  Patient presents with  . New Admit To SNF    New Admission      HPI:  82 y.o. patient is here for short term rehabilitation post hospital admission from 04/12/15-04/19/15 with lumbar spinal stenosis with neurogenic claudication and synovial cyst at L3-4 and L4-5 levels. She underwent decompression, pedicle screw fixation and posteriolateral arthrodesis/ fusion and resection of synovial cyst at L4-5. Post operatively, she had atrial flutter and chest pain and cardiology was consulted. Her troponins were cycled and were negative. Her rate controlling medication was adjusted. She is now on apixaban and aspirin is to be discontinued per discharge summary. She also had urinary urgency and incontinence and urology was consulted.  She is seen in her room today with her daughter present. She would like her flomax to be discontinued. She also wants her perservision changed to home regimen. Her pain isworse with movement. She would like to stay away from norco if possible. At present she grades her pain as 6/10.   Review of Systems:  Constitutional: Negative for fever, chills, diaphoresis.  HENT: Negative for headache, congestion, nasal discharge, difficulty swallowing.   Eyes: Negative for blurred vision, double vision and discharge.  Respiratory: Negative for cough, shortness of breath and wheezing.   Cardiovascular: Negative for chest pain, palpitations, leg swelling.  Gastrointestinal: Negative for heartburn, nausea, vomiting, abdominal pain. Had a bowel movement today. Was taking fibercon at home Genitourinary: Negative for dysuria. Positive for urgency. Musculoskeletal: Negative  for falls in the facility Skin: Negative for itching, rash.  Neurological: Negative for dizziness Psychiatric/Behavioral: Negative for depression    Past Medical History  Diagnosis Date  . Dysrhythmia     PAF  . Arthritis     Back  . RA (rheumatoid arthritis) ([place])   . Hypothyroidism   . Hypertension   . Heart murmur   . Seizures ([place])     after Heart Surgey  . Stroke [facility])     TIA- found by neurologist after   . Anxiety   . GERD (gastroesophageal reflux disease)   . Constipation due to pain medication therapy     after heart surgery  . Restless leg   . Anemia   . Cancer [facility]) 2006    Colon.  Basal Cell Skin cancer- right arm   Past Surgical History  Procedure Laterality Date  . Mitral valve repair  01/20/13    Gore-tex cords to P1, P2, and P3. Magic suture to posterior medial commisure, #30 Physio 1 ring. Done in [place]  . Colon resection  2006    cancer  . Colonoscopy    . Eye surgery Bilateral     Cataract  . Abdominal hysterectomy      Partial   . Tricuspid valve surgery  01/20/13    #28 TriAd ring done in [place]  . Maximum access (mas)posterior lumbar interbody fusion (plif) 2 level N/A 04/12/2015    Procedure: Lumbar Three-Five Decompression, Pedicle Screw Fixation, and Posteriolateral Arthrodesis;  Surgeon: Erline Levine, MD;  Location: [place] [facility];  Service: Neurosurgery;  Laterality: N/A;  L3-4 L4-5 Maximum access posterior lumbar  fusion, possible interbodies and resection of synovial cyst at L4-5   Social History:   reports that she has never smoked. She does not have any smokeless tobacco history on file. She reports that she drinks about 0.6 oz of alcohol per week. She reports that she does not use illicit drugs.  Family History  Problem Relation Age of Onset  . Vaginal cancer Mother   . Alcohol abuse Son     Medications:   Medication List       This list is accurate as of: 04/21/15 11:08 AM.  Always use your most recent med list.                apixaban 2.5 MG Tabs tablet  Commonly known as:  ELIQUIS  Take 1 tablet (2.5 mg total) by mouth 2 (two) times daily.     calcium carbonate 1500 (600 Ca) MG Tabs tablet  Commonly known as:  OSCAL  Take 600 mg of elemental calcium by mouth 2 (two) times daily with a meal.     cholecalciferol 1000 units tablet  Commonly known as:  VITAMIN D  Take 1,000 Units by mouth daily.     diclofenac sodium 1 % Gel  Commonly known as:  VOLTAREN  Apply 4 g topically 4 (four) times daily.     diltiazem 300 MG 24 hr capsule  Commonly known as:  CARDIZEM CD  Take 1 capsule (300 mg total) by mouth daily.     DULoxetine 20 MG capsule  Commonly known as:  CYMBALTA  Take 20 mg by mouth daily. Daily depression     Fish Oil 1000 MG Caps  Take 1,000 mg by mouth daily.     HYDROcodone-acetaminophen 5-325 MG tablet  Commonly known as:  NORCO/VICODIN  1 by mouth every 6 hours as needed for mild pain. 2 by mouth every 6 hours as needed for moderate to severe pain.     hydroxychloroquine 200 MG tablet  Commonly known as:  PLAQUENIL  Take 200 mg by mouth daily.     leflunomide 20 MG tablet  Commonly known as:  ARAVA  Take 20 mg by mouth daily.     levothyroxine 75 MCG tablet  Commonly known as:  SYNTHROID, LEVOTHROID  Take 75 mcg by mouth daily before breakfast. For Hypothyroidism     methocarbamol 500 MG tablet  Commonly known as:  ROBAXIN  Take 1 tablet (500 mg total) by mouth every 6 (six) hours as needed for muscle spasms.     metoprolol succinate 25 MG 24 hr tablet  Commonly known as:  TOPROL-XL  Take 25 mg by mouth daily.     multivitamin with minerals Tabs tablet  Take 1 tablet by mouth daily.     polycarbophil 625 MG tablet  Commonly known as:  FIBERCON  Take 625 mg by mouth daily.     PRESERVISION AREDS 2 Caps  Take 1 Dose by mouth 2 (two) times daily. For eye health     ranitidine 150 MG capsule  Commonly known as:  ZANTAC  Take 150 mg by mouth daily.     tamsulosin 0.4  MG Caps capsule  Commonly known as:  FLOMAX  Take 0.4 mg by mouth daily. For urge incontinence     vitamin B-12 1000 MCG tablet  Commonly known as:  CYANOCOBALAMIN  Take 1,000 mcg by mouth daily.         Physical Exam: Filed Vitals:   04/21/15 1041  BP: 149/78  Pulse: 80  Temp: 97.2 F (36.2 C)  TempSrc: Oral  Resp: 18  Height: 5\' 2"  (1.575 m)  Weight: 123 lb (55.792 kg)  SpO2: 96%    General- elderly female, well built, in no acute distress Head- normocephalic, atraumatic Nose- no maxillary or frontal sinus tenderness, no nasal discharge Throat- moist mucus membrane  Eyes- PERRLA, EOMI, no pallor, no icterus, no discharge, normal conjunctiva, normal sclera Neck- no cervical lymphadenopathy Cardiovascular- normal s1,s2, no murmurs, no leg edema Respiratory- bilateral clear to auscultation, no wheeze, no rhonchi, no crackles, no use of accessory muscles Abdomen- bowel sounds present, soft, non tender Musculoskeletal- able to move all 4 extremities, generalized weakness, on wheelchair at present, needs assistance with transfers  Neurological- no focal deficit, alert and oriented to person, place and time Skin- warm and dry, lumbar surgical incision healing well with honeycomb dressing over it Psychiatry- normal mood and affect    Labs reviewed: Basic Metabolic Panel:  Recent Labs  04/11/15 1448  NA 136  K 4.1  CL 99*  CO2 27  GLUCOSE 104*  BUN 15  CREATININE 0.71  CALCIUM 9.4   Liver Function Tests: No results for input(s): AST, ALT, ALKPHOS, BILITOT, PROT, ALBUMIN in the last 8760 hours. No results for input(s): LIPASE, AMYLASE in the last 8760 hours. No results for input(s): AMMONIA in the last 8760 hours. CBC:  Recent Labs  04/11/15 1448  WBC 8.0  HGB 12.0  HCT 35.8*  MCV 103.5*  PLT 183   Cardiac Enzymes:  Recent Labs  04/13/15 2057 04/14/15 0053  TROPONINI <0.03 <0.03   BNP: Invalid input(s): POCBNP CBG: No results for input(s):  GLUCAP in the last 8760 hours.  Radiological Exams: Dg Lumbar Spine 2-3 Views  04/12/2015  CLINICAL DATA:  L3-5 PLIF.  Intraoperative images. EXAM: DG C-ARM 61-120 MIN; LUMBAR SPINE - 2-3 VIEW COMPARISON:  MRI lumbar spine performed at an outside facility 11/02/2014. FINDINGS: We are provided with 2 fluoroscopic intraoperative spot views of the lumbar spine. Images demonstrate pedicle screws in place at L3, L4 and L5. Laminectomy defects are identified. No acute abnormality is seen. IMPRESSION: L3-5 PLIF in progress. Electronically Signed   By: Inge Rise M.D.   On: 04/12/2015 15:31   Dg C-arm 1-60 Min  04/12/2015  CLINICAL DATA:  L3-5 PLIF.  Intraoperative images. EXAM: DG C-ARM 61-120 MIN; LUMBAR SPINE - 2-3 VIEW COMPARISON:  MRI lumbar spine performed at an outside facility 11/02/2014. FINDINGS: We are provided with 2 fluoroscopic intraoperative spot views of the lumbar spine. Images demonstrate pedicle screws in place at L3, L4 and L5. Laminectomy defects are identified. No acute abnormality is seen. IMPRESSION: L3-5 PLIF in progress. Electronically Signed   By: Inge Rise M.D.   On: 04/12/2015 15:31     Assessment/Plan  Lumbar stenosis S/p lumbar decompression and fusion. With her pain not under control and her trying to refrain from norco will make changes as below. Start tylenol extra strength 1000 mg bid. Start tramadol 50 mg 1-2 tab q6h prn for mild to moderate pain and give norco 5-325 mg 1 tab only q6h prn for severe pain. Patient and daughter agree to this regimen. Continue robaxin 500 mg q6h prn muscle spasm. Back precautions. To use her brace. Has f/u with nuerosurgery. Will have patient work with PT/OT as tolerated to regain strength and restore function.  Fall precautions are in place.  Atrial flutter Rate currently controlled. Continue cardizem 300 mg daily and toprol xl 25 mg  daily. Continue eliquis bid. Discontinue baby aspirin for now. Patient to follow with  cardiology in [place] per d/c summary. Check BP and HR bid x 1 week  Urinary urgency Seen by urology and thought to be from OAB. Continue myrbetriq. Discontinue flomax per resident request and monitor.  Constipation With hx of colon cancer. Start fibercon 625 mg 2 tab qhs and also add colace 100 mg daily only as needed and monitor  OA Continue oscal with vitamin d and voltaren gel  RA Continue her home regimen plaquenil and leflunomide  Hypothyroidism Continue her synthroid, no changes made  b12 def Continue b12 supplement   Goals of care: short term rehabilitation   Labs/tests ordered: cbc with diff, cmp   Family/ staff Communication: reviewed care plan with patient, her daughter and nursing supervisor    Blanchie Serve, MD  [facility] [phone] (Monday-Friday 8 am - 5 pm) [phone] (afterhours)

## 2015-04-22 ENCOUNTER — Other Ambulatory Visit: Payer: Self-pay | Admitting: *Deleted

## 2015-04-22 MED ORDER — HYDROCODONE-ACETAMINOPHEN 5-325 MG PO TABS: ORAL_TABLET | ORAL | Status: DC

## 2015-04-22 MED ORDER — TRAMADOL HCL 50 MG PO TABS: ORAL_TABLET | ORAL | Status: DC

## 2015-04-22 NOTE — Telephone Encounter (Signed)
[facility] 

## 2015-04-25 LAB — HEPATIC FUNCTION PANEL
ALT: 17 U/L (ref 7–35)
AST: 21 U/L (ref 13–35)
Alkaline Phosphatase: 95 U/L (ref 25–125)
Bilirubin, Total: 0.5 mg/dL

## 2015-04-25 LAB — BASIC METABOLIC PANEL
BUN: 22 mg/dL — AB (ref 4–21)
Creatinine: 0.8 mg/dL (ref 0.5–1.1)
Glucose: 78 mg/dL
Potassium: 4.5 mmol/L (ref 3.4–5.3)
Sodium: 136 mmol/L — AB (ref 137–147)

## 2015-04-25 LAB — CBC AND DIFFERENTIAL
HCT: 27 % — AB (ref 36–46)
Hemoglobin: 9 g/dL — AB (ref 12.0–16.0)
Platelets: 282 10*3/uL (ref 150–399)
WBC: 8.7 10^3/mL

## 2015-04-26 LAB — HEPATIC FUNCTION PANEL
ALT: 25 U/L (ref 7–35)
AST: 27 U/L (ref 13–35)
Alkaline Phosphatase: 111 U/L (ref 25–125)
Bilirubin, Total: 0.5 mg/dL

## 2015-04-26 LAB — CBC AND DIFFERENTIAL
HCT: 27 % — AB (ref 36–46)
Hemoglobin: 8.9 g/dL — AB (ref 12.0–16.0)
Platelets: 269 10*3/uL (ref 150–399)
WBC: 7.8 10^3/mL

## 2015-04-26 LAB — BASIC METABOLIC PANEL
BUN: 18 mg/dL (ref 4–21)
Creatinine: 0.8 mg/dL (ref 0.5–1.1)
Glucose: 80 mg/dL
Potassium: 3.8 mmol/L (ref 3.4–5.3)
Sodium: 133 mmol/L — AB (ref 137–147)

## 2015-04-29 LAB — CBC AND DIFFERENTIAL
HCT: 26 % — AB (ref 36–46)
HCT: 26 % — AB (ref 36–46)
Hemoglobin: 8.8 g/dL — AB (ref 12.0–16.0)
Hemoglobin: 8.8 g/dL — AB (ref 12.0–16.0)
Platelets: 251 10*3/uL (ref 150–399)
WBC: 6.9 10^3/mL
WBC: 6.9 10^3/mL

## 2015-05-02 ENCOUNTER — Encounter: Payer: Self-pay | Admitting: Family

## 2015-05-02 ENCOUNTER — Non-Acute Institutional Stay (SKILLED_NURSING_FACILITY): Payer: Medicare Other | Admitting: Family

## 2015-05-02 DIAGNOSIS — D509 Iron deficiency anemia, unspecified: Secondary | ICD-10-CM | POA: Insufficient documentation

## 2015-05-02 DIAGNOSIS — M545 Low back pain, unspecified: Secondary | ICD-10-CM

## 2015-05-02 DIAGNOSIS — M549 Dorsalgia, unspecified: Secondary | ICD-10-CM | POA: Insufficient documentation

## 2015-05-02 NOTE — Progress Notes (Signed)
Patient ID: Kathy Velez, female   DOB: 10-31-1931, 82 y.o.   MRN: [id number]  Location: [facility]  Provider: Blanchie Serve, MD   No primary care provider on file.  Code Status:  Full Code  Goals of care: Advanced Directive information Advanced Directives 05/02/2015  Does patient have an advance directive? No  Type of Advance Directive -  Copy of advanced directive(s) in chart? Yes     Chief Complaint  Patient presents with  . Acute Visit    Lab results    HPI:  Pt is a 82 y.o. female seen today at [facility] for acute medical issues. Patient is here here for short term rehabilitation post hospital admission from 04/12/15-04/19/15 with lumbar spinal stenosis with neurogenic claudication and synovial cyst at L3-4 and L4-5 levels. She underwent decompression, pedicle screw fixation and posteriolateral arthrodesis/ fusion and resection of synovial cyst at L4-5. Post operatively, she had atrial flutter and chest pain and cardiology was consulted. She has a past medical History of Hypothyroidism, Rheumatoid Arthritis, OA, Vitamin B12 deficiency among others. She is seen today in her room. Her recent lab work Hgb was 8.8 (04/29/15) previous level 9.0 (04/26/2015). Guaiac stool recent ordered and Ferrous sulfate initiated. Patient nurse states had one stool specimen Guaiac positive other two still pending. Patient denies any dizziness, weakness, faint feeling, bleeding or any abdominal pain. She reports history of external hemorrhoids but has not had any issues with recently. She continues to work well with PT/OT except for her lower back pain pain medications has been effective.  Review of Systems  Constitutional: Negative for fever, chills and malaise/fatigue.  Eyes: Negative.   Respiratory: Negative.   Cardiovascular: Negative for chest pain, palpitations and leg swelling.  Gastrointestinal: Negative for nausea, vomiting, abdominal pain,  diarrhea, constipation, blood in stool and melena.  Genitourinary: Negative.   Musculoskeletal: Negative.  Negative for falls.  Skin: Negative.   Neurological: Negative for dizziness, tingling, tremors, loss of consciousness, weakness and headaches.  Endo/Heme/Allergies: Negative.   Psychiatric/Behavioral: Negative.     Past Medical History  Diagnosis Date  . Dysrhythmia     PAF  . Arthritis     Back  . RA (rheumatoid arthritis) ([place])   . Hypothyroidism   . Hypertension   . Heart murmur   . Seizures ([place])     after Heart Surgey  . Stroke [facility])     TIA- found by neurologist after   . Anxiety   . GERD (gastroesophageal reflux disease)   . Constipation due to pain medication therapy     after heart surgery  . Restless leg   . Anemia   . Cancer [facility]) 2006    Colon.  Basal Cell Skin cancer- right arm   Past Surgical History  Procedure Laterality Date  . Mitral valve repair  01/20/13    Gore-tex cords to P1, P2, and P3. Magic suture to posterior medial commisure, #30 Physio 1 ring. Done in [place]  . Colon resection  2006    cancer  . Colonoscopy    . Eye surgery Bilateral     Cataract  . Abdominal hysterectomy      Partial   . Tricuspid valve surgery  01/20/13    #28 TriAd ring done in [place]  . Maximum access (mas)posterior lumbar interbody fusion (plif) 2 level N/A 04/12/2015    Procedure: Lumbar Three-Five Decompression, Pedicle Screw Fixation, and Posteriolateral Arthrodesis;  Surgeon: Erline Levine, MD;  Location:  [place] [facility];  Service: Neurosurgery;  Laterality: N/A;  L3-4 L4-5 Maximum access posterior lumbar fusion, possible interbodies and resection of synovial cyst at L4-5    Allergies  Allergen Reactions  . Codeine Other (See Comments)    "just don't take it well"  . Bee Venom Swelling and Rash      Medication List       This list is accurate as of: 05/02/15  2:33 PM.  Always use your most recent med list.               Acetaminophen 500 MG  coapsule  Take 2 capsules by mouth 2 (two) times daily.     apixaban 2.5 MG Tabs tablet  Commonly known as:  ELIQUIS  Take 1 tablet (2.5 mg total) by mouth 2 (two) times daily.     calcium carbonate 1500 (600 Ca) MG Tabs tablet  Commonly known as:  OSCAL  Take 600 mg of elemental calcium by mouth 2 (two) times daily with a meal.     cholecalciferol 1000 units tablet  Commonly known as:  VITAMIN D  Take 1,000 Units by mouth daily.     diclofenac sodium 1 % Gel  Commonly known as:  VOLTAREN  Apply 4 g topically 4 (four) times daily.     diltiazem 300 MG 24 hr capsule  Commonly known as:  CARDIZEM CD  Take 1 capsule (300 mg total) by mouth daily.     docusate sodium 100 MG capsule  Commonly known as:  COLACE  Take 100 mg by mouth daily.     DULoxetine 20 MG capsule  Commonly known as:  CYMBALTA  Take 20 mg by mouth daily. Daily depression     ferrous sulfate 325 (65 FE) MG tablet  Take 325 mg by mouth daily with breakfast.     Fish Oil 1000 MG Caps  Take 1,000 mg by mouth daily.     HYDROcodone-acetaminophen 5-325 MG tablet  Commonly known as:  NORCO/VICODIN  Take one tablet by mouth every 6 hours as needed for pain 8-10. Do not exceed 4gm of Tylenol in 24 hours     hydroxychloroquine 200 MG tablet  Commonly known as:  PLAQUENIL  Take 200 mg by mouth daily.     leflunomide 20 MG tablet  Commonly known as:  ARAVA  Take 20 mg by mouth daily.     levothyroxine 75 MCG tablet  Commonly known as:  SYNTHROID, LEVOTHROID  Take 75 mcg by mouth daily before breakfast. For Hypothyroidism     methocarbamol 500 MG tablet  Commonly known as:  ROBAXIN  Take 1 tablet (500 mg total) by mouth every 6 (six) hours as needed for muscle spasms.     metoprolol succinate 25 MG 24 hr tablet  Commonly known as:  TOPROL-XL  Take 25 mg by mouth daily.     multivitamin with minerals Tabs tablet  Take 1 tablet by mouth daily.     polycarbophil 625 MG tablet  Commonly known as:   FIBERCON  Take 625 mg by mouth daily.     PRESERVISION AREDS 2 Caps  Take 1 Dose by mouth 2 (two) times daily. For eye health     ranitidine 150 MG capsule  Commonly known as:  ZANTAC  Take 150 mg by mouth daily.     tamsulosin 0.4 MG Caps capsule  Commonly known as:  FLOMAX  Take 0.4 mg by mouth daily. For urge incontinence  traMADol 50 MG tablet  Commonly known as:  ULTRAM  Take one tablet by mouth every 6 hour as needed for pain 1-4; Take two tablets by mouth every 6 hours as needed for pain 5-7     vitamin B-12 1000 MCG tablet  Commonly known as:  CYANOCOBALAMIN  Take 1,000 mcg by mouth daily.        Immunization History  Administered Date(s) Administered  . Influenza-Unspecified 04/15/2015  . PPD Test 04/19/2015, 04/25/2015   Pertinent  Health Maintenance Due  Topic Date Due  . PNA vac Low Risk Adult (1 of 2 - PCV13) 05/07/2016 (Originally 11/13/1998)  . DEXA SCAN  05/06/2018 (Originally 11/13/1998)  . INFLUENZA VACCINE  10/25/2015   No flowsheet data found.  Filed Vitals:   05/02/15 1235  BP: 124/74  Pulse: 77  Temp: 97.7 F (36.5 C)  Resp: 20  Height: 5\' 2"  (1.575 m)  Weight: 119 lb (53.978 kg)  SpO2: 97%   Body mass index is 21.76 kg/(m^2). Physical Exam  Constitutional: She is oriented to person, place, and time. She appears well-developed and well-nourished.  Elderly in no acute distress  HENT:  Head: Normocephalic.  Eyes: EOM are normal. Pupils are equal, round, and reactive to light. No scleral icterus.  Neck: Normal range of motion.  Cardiovascular: Normal rate and regular rhythm.  Exam reveals no gallop and no friction rub.   No murmur heard. Pulmonary/Chest: Effort normal and breath sounds normal.  Abdominal: Soft. Bowel sounds are normal. She exhibits no distension and no mass. There is no tenderness. There is guarding.  Musculoskeletal: She exhibits no edema or tenderness.  Limited ROM due to lower back pain post op incision.     Lymphadenopathy:    She has no cervical adenopathy.  Neurological: She is oriented to person, place, and time.  Skin: Skin is warm and dry. No erythema. No pallor.  Psychiatric: She has a normal mood and affect.    Labs reviewed:  Recent Labs  04/11/15 1448 04/25/15 04/26/15  NA 136 136* 133*  K 4.1 4.5 3.8  CL 99*  --   --   CO2 27  --   --   GLUCOSE 104*  --   --   BUN 15 22* 18  CREATININE 0.71 0.8 0.8  CALCIUM 9.4  --   --     Recent Labs  04/25/15 04/26/15  AST 21 27  ALT 17 25  ALKPHOS 95 111    Recent Labs  04/11/15 1448 04/25/15 04/26/15 04/29/15  WBC 8.0 8.7 7.8 6.9  6.9  HGB 12.0 9.0* 8.9* 8.8*  8.8*  HCT 35.8* 27* 27* 26*  26*  MCV 103.5*  --   --   --   PLT 183 282 269 251   No results found for: TSH No results found for: HGBA1C No results found for: CHOL, HDL, LDLCALC, LDLDIRECT, TRIG, CHOLHDL  Significant Diagnostic Results in last 30 days:  Dg Lumbar Spine 2-3 Views  04/12/2015  CLINICAL DATA:  L3-5 PLIF.  Intraoperative images. EXAM: DG C-ARM 61-120 MIN; LUMBAR SPINE - 2-3 VIEW COMPARISON:  MRI lumbar spine performed at an outside facility 11/02/2014. FINDINGS: We are provided with 2 fluoroscopic intraoperative spot views of the lumbar spine. Images demonstrate pedicle screws in place at L3, L4 and L5. Laminectomy defects are identified. No acute abnormality is seen. IMPRESSION: L3-5 PLIF in progress. Electronically Signed   By: Inge Rise M.D.   On: 04/12/2015 15:31   Dg  C-arm 1-60 Min  04/12/2015  CLINICAL DATA:  L3-5 PLIF.  Intraoperative images. EXAM: DG C-ARM 61-120 MIN; LUMBAR SPINE - 2-3 VIEW COMPARISON:  MRI lumbar spine performed at an outside facility 11/02/2014. FINDINGS: We are provided with 2 fluoroscopic intraoperative spot views of the lumbar spine. Images demonstrate pedicle screws in place at L3, L4 and L5. Laminectomy defects are identified. No acute abnormality is seen. IMPRESSION: L3-5 PLIF in progress. Electronically  Signed   By: Inge Rise M.D.   On: 04/12/2015 15:31    Assessment/Plan 1. Iron deficiency anemia Asymptomatic. recent lab work Hgb was 8.8 (04/29/15) previous level 9.0 (04/26/2015). Guaiac stool recent ordered and Ferrous sulfate initiated. Patient nurse states had one stool specimen Guaiac positive other two still pending.Will increase Ferrous sulfate to 325 mg Tablet Bid. Repeat CBC, Folate and Vit B12 05/04/2015. Awaiting x 2 Guaiac stool results.   2. Bilateral low back pain without sciatica Continue current pain regimen. Continue with PT/OT.      Family/ staff Communication: Reviewed plan of care with patient and facility Nurse.   Labs/tests ordered:  CBC, Folate, Vit B12 05/04/2015

## 2015-05-05 ENCOUNTER — Non-Acute Institutional Stay (SKILLED_NURSING_FACILITY): Payer: Medicare Other | Admitting: Family

## 2015-05-05 DIAGNOSIS — M069 Rheumatoid arthritis, unspecified: Secondary | ICD-10-CM | POA: Insufficient documentation

## 2015-05-05 DIAGNOSIS — K5901 Slow transit constipation: Secondary | ICD-10-CM | POA: Diagnosis not present

## 2015-05-05 DIAGNOSIS — E538 Deficiency of other specified B group vitamins: Secondary | ICD-10-CM | POA: Diagnosis not present

## 2015-05-05 DIAGNOSIS — M545 Low back pain, unspecified: Secondary | ICD-10-CM

## 2015-05-05 DIAGNOSIS — K5900 Constipation, unspecified: Secondary | ICD-10-CM | POA: Insufficient documentation

## 2015-05-05 DIAGNOSIS — E039 Hypothyroidism, unspecified: Secondary | ICD-10-CM | POA: Diagnosis not present

## 2015-05-05 DIAGNOSIS — K5909 Other constipation: Secondary | ICD-10-CM | POA: Insufficient documentation

## 2015-05-05 DIAGNOSIS — I4892 Unspecified atrial flutter: Secondary | ICD-10-CM | POA: Diagnosis not present

## 2015-05-05 DIAGNOSIS — M4316 Spondylolisthesis, lumbar region: Secondary | ICD-10-CM

## 2015-05-05 DIAGNOSIS — M06049 Rheumatoid arthritis without rheumatoid factor, unspecified hand: Secondary | ICD-10-CM | POA: Diagnosis not present

## 2015-05-05 DIAGNOSIS — D509 Iron deficiency anemia, unspecified: Secondary | ICD-10-CM

## 2015-05-05 NOTE — Progress Notes (Signed)
Patient ID: Kathy Velez, female   DOB: [birth year], 82 y.o.   MRN: [id number]  Location:  [facility]  Provider: Blanchie Serve, MD   PCP: No primary care provider on file.  Code Status: Full Code  Goals of care:  Advanced Directive information Advanced Directives 05/02/2015  Does patient have an advance directive? No  Type of Advance Directive -  Copy of advanced directive(s) in chart? Yes     Allergies  Allergen Reactions  . Codeine Other (See Comments)    "just don't take it well"  . Bee Venom Swelling and Rash    Chief Complaint  Patient presents with  . Discharge Note    HPI:  82 y.o. female  Seen today at [facility] for discharge home.She is here for short term rehabilitation post hospital admission from 04/12/15-04/19/15 with lumbar spinal stenosis with neurogenic claudication and synovial cyst at L3-4 and L4-5 levels. She underwent decompression, pedicle screw fixation and posteriolateral arthrodesis/ fusion and resection of synovial cyst at L4-5. Post operatively, she had atrial flutter and chest pain and cardiology was consulted. Her troponins were cycled and were negative. Her rate controlling medication was adjusted. She is now on apixaban.  Aspirin was discontinued per discharge summary. She also had urinary urgency and incontinence and urology was consulted.She is seen in her room today. She has been voiding without any difficulties.She has worked with PT/OT with much improvement walking on the hallway.She wears her back brace while up. She denies any acute issues. She stable to discharge home with PT/OT for ROM, exercise, gait stability and muscle strengthening.She will require a Rollator for fall precautions.      Review of Systems  Constitutional: Negative for fever, chills and malaise/fatigue.  HENT: Negative.   Eyes: Negative.   Respiratory: Negative.   Cardiovascular: Negative.   Gastrointestinal: Negative.     Genitourinary: Negative.   Musculoskeletal: Positive for back pain. Negative for falls.       Has Back brace  Skin: Negative.   Neurological: Negative.   Endo/Heme/Allergies: Negative.   Psychiatric/Behavioral: Negative.     Past Medical History  Diagnosis Date  . Dysrhythmia     PAF  . Arthritis     Back  . RA (rheumatoid arthritis) ([place])   . Hypothyroidism   . Hypertension   . Heart murmur   . Seizures ([place])     after Heart [facility]ge[facility]  . Stroke [facility])     TIA- found by neurologist after   . Anxiety   . GERD (gastroesophageal reflux disease)   . Constipation due to pain medication therapy     after heart surgery  . Restless leg   . Anemia   . Cancer [facility]) 2006    Colon.  Basal Cell Skin cancer- right arm    Past Surgical History  Procedure Laterality Date  . Mitral valve repair  01/20/13    Gore-tex cords to P1, P2, and P3. Magic suture to posterior medial commisure, #30 Physio 1 ring. Done in [place]  . Colon resection  2006    cancer  . Colonoscopy    . Eye surgery Bilateral     Cataract  . Abdominal hysterectomy      Partial   . Tricuspid valve surgery  01/20/13    #28 TriAd ring done in [place]  . Maximum access (mas)posterior lumbar interbody fusion (plif) 2 level N/A 04/12/2015    Procedure: Lumbar Three-Five Decompression, Pedicle Screw Fixation, and Posteriolateral  Arthrodesis;  Surgeon: Erline Levine, MD;  Location: [place] [facility];  Service: Neurosurgery;  Laterality: N/A;  L3-4 L4-5 Maximum access posterior lumbar fusion, possible interbodies and resection of synovial cyst at L4-5      reports that she has never smoked. She does not have any smokeless tobacco history on file. She reports that she drinks about 0.6 oz of alcohol per week. She reports that she does not use illicit drugs.  Allergies  Allergen Reactions  . Codeine Other (See Comments)    "just don't take it well"  . Bee Venom Swelling and Rash    Pertinent  Health Maintenance Due  Topic Date  Due  . PNA vac Low Risk Adult (1 of 2 - PCV13) 05/07/2016 (Originally 11/13/1998)  . DEXA SCAN  05/06/2018 (Originally 11/13/1998)  . INFLUENZA VACCINE  10/25/2015    Medications:   Medication List       This list is accurate as of: 05/05/15  6:03 PM.  Always use your most recent med list.               Acetaminophen 500 MG coapsule  Take 2 capsules by mouth 2 (two) times daily.     apixaban 2.5 MG Tabs tablet  Commonly known as:  ELIQUIS  Take 1 tablet (2.5 mg total) by mouth 2 (two) times daily.     calcium carbonate 1500 (600 Ca) MG Tabs tablet  Commonly known as:  OSCAL  Take 600 mg of elemental calcium by mouth 2 (two) times daily with a meal.     cholecalciferol 1000 units tablet  Commonly known as:  VITAMIN D  Take 1,000 Units by mouth daily.     diclofenac sodium 1 % Gel  Commonly known as:  VOLTAREN  Apply 4 g topically 4 (four) times daily.     diltiazem 300 MG 24 hr capsule  Commonly known as:  CARDIZEM CD  Take 1 capsule (300 mg total) by mouth daily.     docusate sodium 100 MG capsule  Commonly known as:  COLACE  Take 100 mg by mouth daily.     DULoxetine 20 MG capsule  Commonly known as:  CYMBALTA  Take 20 mg by mouth daily. Daily depression     ferrous sulfate 325 (65 FE) MG tablet  Take 325 mg by mouth 2 (two) times daily.     Fish Oil 1000 MG Caps  Take 1,000 mg by mouth daily.     HYDROcodone-acetaminophen 5-325 MG tablet  Commonly known as:  NORCO/VICODIN  Take one tablet by mouth every 6 hours as needed for pain 8-10. Do not exceed 4gm of Tylenol in 24 hours     hydroxychloroquine 200 MG tablet  Commonly known as:  PLAQUENIL  Take 200 mg by mouth daily.     leflunomide 20 MG tablet  Commonly known as:  ARAVA  Take 20 mg by mouth daily.     levothyroxine 75 MCG tablet  Commonly known as:  SYNTHROID, LEVOTHROID  Take 75 mcg by mouth daily before breakfast. For Hypothyroidism     methocarbamol 500 MG tablet  Commonly known as:   ROBAXIN  Take 1 tablet (500 mg total) by mouth every 6 (six) hours as needed for muscle spasms.     metoprolol succinate 25 MG 24 hr tablet  Commonly known as:  TOPROL-XL  Take 25 mg by mouth daily.     multivitamin with minerals Tabs tablet  Take 1 tablet by mouth daily.  polycarbophil 625 MG tablet  Commonly known as:  FIBERCON  Take 625 mg by mouth daily.     PRESERVISION AREDS 2 Caps  Take 1 Dose by mouth 2 (two) times daily. For eye health     ranitidine 150 MG capsule  Commonly known as:  ZANTAC  Take 150 mg by mouth daily.     traMADol 50 MG tablet  Commonly known as:  ULTRAM  Take one tablet by mouth every 6 hour as needed for pain 1-4; Take two tablets by mouth every 6 hours as needed for pain 5-7     vitamin B-12 1000 MCG tablet  Commonly known as:  CYANOCOBALAMIN  Take 1,000 mcg by mouth daily.         Filed Vitals:   05/05/15 1728  BP: 127/84  Pulse: 89  Temp: 98.5 F (36.9 C)  Resp: 20  Weight: 119 lb (53.978 kg)  SpO2: 97%   Body mass index is 21.76 kg/(m^2). Physical Exam  Constitutional: She is oriented to person, place, and time. She appears well-developed and well-nourished.  Elderly in no acute distress  HENT:  Head: Normocephalic.  Right Ear: External ear normal.  Left Ear: External ear normal.  Mouth/Throat: Oropharynx is clear and moist.  Eyes: EOM are normal. Pupils are equal, round, and reactive to light. Right eye exhibits no discharge. Left eye exhibits no discharge. No scleral icterus.  Neck: Normal range of motion. No JVD present. No tracheal deviation present. No thyromegaly present.  Cardiovascular: Normal rate, regular rhythm, normal heart sounds and intact distal pulses.  Exam reveals no gallop and no friction rub.   No murmur heard. Pulmonary/Chest: Effort normal and breath sounds normal. No respiratory distress. She has no wheezes. She has no rales.  Abdominal: Soft. Bowel sounds are normal. She exhibits mass. She exhibits  no distension. There is no tenderness. There is no rebound and no guarding.  Musculoskeletal:  ROM normal except back limited due to pain. Back brace in place.   Lymphadenopathy:    She has no cervical adenopathy.  Neurological: She is oriented to person, place, and time.  Skin: Skin is warm and dry. No rash noted. No erythema. No pallor.  Psychiatric: She has a normal mood and affect.    Labs reviewed: Basic Metabolic Panel:  Recent Labs  04/11/15 1448 04/25/15 04/26/15  NA 136 136* 133*  K 4.1 4.5 3.8  CL 99*  --   --   CO2 27  --   --   GLUCOSE 104*  --   --   BUN 15 22* 18  CREATININE 0.71 0.8 0.8  CALCIUM 9.4  --   --    Liver Function Tests:  Recent Labs  04/25/15 04/26/15  AST 21 27  ALT 17 25  ALKPHOS 95 111   No results for input(s): LIPASE, AMYLASE in the last 8760 hours. No results for input(s): AMMONIA in the last 8760 hours. CBC:  Recent Labs  04/11/15 1448 04/25/15 04/26/15 04/29/15  WBC 8.0 8.7 7.8 6.9  6.9  HGB 12.0 9.0* 8.9* 8.8*  8.8*  HCT 35.8* 27* 27* 26*  26*  MCV 103.5*  --   --   --   PLT 183 282 269 251   Cardiac Enzymes:  Recent Labs  04/13/15 2057 04/14/15 0053  TROPONINI <0.03 <0.03   BNP: Invalid input(s): POCBNP CBG: No results for input(s): GLUCAP in the last 8760 hours.  Procedures and Imaging Studies During Stay: Dg Lumbar Spine  2-3 Views  04/12/2015  CLINICAL DATA:  L3-5 PLIF.  Intraoperative images. EXAM: DG C-ARM 61-120 MIN; LUMBAR SPINE - 2-3 VIEW COMPARISON:  MRI lumbar spine performed at an outside facility 11/02/2014. FINDINGS: We are provided with 2 fluoroscopic intraoperative spot views of the lumbar spine. Images demonstrate pedicle screws in place at L3, L4 and L5. Laminectomy defects are identified. No acute abnormality is seen. IMPRESSION: L3-5 PLIF in progress. Electronically Signed   By: Inge Rise M.D.   On: 04/12/2015 15:31   Dg C-arm 1-60 Min  04/12/2015  CLINICAL DATA:  L3-5 PLIF.   Intraoperative images. EXAM: DG C-ARM 61-120 MIN; LUMBAR SPINE - 2-3 VIEW COMPARISON:  MRI lumbar spine performed at an outside facility 11/02/2014. FINDINGS: We are provided with 2 fluoroscopic intraoperative spot views of the lumbar spine. Images demonstrate pedicle screws in place at L3, L4 and L5. Laminectomy defects are identified. No acute abnormality is seen. IMPRESSION: L3-5 PLIF in progress. Electronically Signed   By: Inge Rise M.D.   On: 04/12/2015 15:31    Assessment/Plan:   1. Atrial flutter, unspecified type ([place]) Free of chest pain.HR controlled on diltiazem, Metoprolol and Eliquis. Follow up with her Cardiologist in [place] per hospital discharge summary to proceed with Cardioversion.  2. Iron deficiency anemia Stable.Recent Hgb 9.0 (05/04/2015) continue Ferrous sulfate. Follow up with PCP to monitor CBC.   3. Bilateral low back pain without sciatica post hospital admission from 04/12/15-04/19/15 with lumbar spinal stenosis with neurogenic claudication and synovial cyst at L3-4 and L4-5 levels. She underwent decompression, pedicle screw fixation and posteriolateral arthrodesis/ fusion and resection of synovial cyst at L4-5. Has worked with PT/OT with much improvement. Pain well controlled on Tramadol and extra strength Tylenol. Wears back brace. Will D/c home with PT/OT for ROM, exercise, gait stability and muscle strengthening.She will require a Rollator for fall precautions. Follow up with  Dr. Vertell Limber.    4. Hypothyroidism, unspecified hypothyroidism type Continue on Levothyroxine 75 mcg Tablet. PCP to monitor TSH level and adjust as indicated.    5. Rheumatoid arthritis involving hand with negative rheumatoid factor, unspecified laterality ([place]) Continue on Plaquelnil and Arava.   6. Slow transit constipation Current regimen effective.   7. Vitamin B12 deficiency Continue on Vit B12 supplement. PCP to monitor Vit B12 level.   8. Spondylolisthesis of lumbar region S/p   decompression, pedicle screw fixation and posteriolateral arthrodesis/ fusion and resection of synovial cyst at L4-5. Has worked with PT/OT with much improvement. Pain well controlled on Tramadol and extra strength Tylenol. Wears back brace. Will D/c home with PT/OT for ROM, exercise, gait stability and muscle strengthening.She will require a Rollator for fall precautions. Follow up with  Dr. Vertell Limber.     Patient is being discharged with the following home health services:   PT/OT for ROM, exercise, gait stability and muscle strengthening.  Patient is being discharged with the following durable medical equipment:   She will require a Rollator for fall precautions.  Patient has been advised to f/u with their PCP in 1-2 weeks to bring them up to date on their rehab stay.  Social services at facility was responsible for arranging this appointment.  Pt was provided with a 30 day supply of prescriptions for medications and refills must be obtained from their PCP.  For controlled substances, a more limited supply may be provided adequate until PCP appointment only.  Future labs/tests needed:  CBC, CMP,TSH , Vitamin B12 with PCP

## 2015-07-18 DIAGNOSIS — G451 Carotid artery syndrome (hemispheric): Secondary | ICD-10-CM | POA: Insufficient documentation

## 2015-12-28 DIAGNOSIS — J849 Interstitial pulmonary disease, unspecified: Secondary | ICD-10-CM | POA: Insufficient documentation

## 2016-04-17 DIAGNOSIS — L601 Onycholysis: Secondary | ICD-10-CM | POA: Diagnosis not present

## 2016-04-17 DIAGNOSIS — Z789 Other specified health status: Secondary | ICD-10-CM | POA: Diagnosis not present

## 2016-06-06 DIAGNOSIS — J302 Other seasonal allergic rhinitis: Secondary | ICD-10-CM | POA: Diagnosis not present

## 2016-06-06 DIAGNOSIS — R0982 Postnasal drip: Secondary | ICD-10-CM | POA: Diagnosis not present

## 2016-06-06 DIAGNOSIS — R05 Cough: Secondary | ICD-10-CM | POA: Diagnosis not present

## 2016-06-06 DIAGNOSIS — J849 Interstitial pulmonary disease, unspecified: Secondary | ICD-10-CM | POA: Diagnosis not present

## 2016-06-11 DIAGNOSIS — H353131 Nonexudative age-related macular degeneration, bilateral, early dry stage: Secondary | ICD-10-CM | POA: Diagnosis not present

## 2016-06-11 DIAGNOSIS — H4912 Fourth [trochlear] nerve palsy, left eye: Secondary | ICD-10-CM | POA: Diagnosis not present

## 2016-06-11 DIAGNOSIS — Z79899 Other long term (current) drug therapy: Secondary | ICD-10-CM | POA: Diagnosis not present

## 2016-06-28 DIAGNOSIS — J310 Chronic rhinitis: Secondary | ICD-10-CM | POA: Diagnosis not present

## 2016-06-28 DIAGNOSIS — H903 Sensorineural hearing loss, bilateral: Secondary | ICD-10-CM | POA: Diagnosis not present

## 2016-06-28 DIAGNOSIS — H61893 Other specified disorders of external ear, bilateral: Secondary | ICD-10-CM | POA: Diagnosis not present

## 2016-06-28 DIAGNOSIS — J343 Hypertrophy of nasal turbinates: Secondary | ICD-10-CM | POA: Diagnosis not present

## 2016-07-25 DIAGNOSIS — E039 Hypothyroidism, unspecified: Secondary | ICD-10-CM | POA: Diagnosis not present

## 2016-07-25 DIAGNOSIS — I480 Paroxysmal atrial fibrillation: Secondary | ICD-10-CM | POA: Diagnosis not present

## 2016-07-25 DIAGNOSIS — I10 Essential (primary) hypertension: Secondary | ICD-10-CM | POA: Diagnosis not present

## 2016-07-25 DIAGNOSIS — R5383 Other fatigue: Secondary | ICD-10-CM | POA: Diagnosis not present

## 2016-07-25 DIAGNOSIS — R238 Other skin changes: Secondary | ICD-10-CM | POA: Diagnosis not present

## 2016-07-25 DIAGNOSIS — M0579 Rheumatoid arthritis with rheumatoid factor of multiple sites without organ or systems involvement: Secondary | ICD-10-CM | POA: Diagnosis not present

## 2016-07-25 DIAGNOSIS — R5381 Other malaise: Secondary | ICD-10-CM | POA: Diagnosis not present

## 2016-07-26 DIAGNOSIS — D7589 Other specified diseases of blood and blood-forming organs: Secondary | ICD-10-CM | POA: Diagnosis not present

## 2016-08-06 DIAGNOSIS — I25118 Atherosclerotic heart disease of native coronary artery with other forms of angina pectoris: Secondary | ICD-10-CM | POA: Diagnosis not present

## 2016-08-06 DIAGNOSIS — Z1379 Encounter for other screening for genetic and chromosomal anomalies: Secondary | ICD-10-CM | POA: Diagnosis not present

## 2016-08-06 DIAGNOSIS — Z79899 Other long term (current) drug therapy: Secondary | ICD-10-CM | POA: Diagnosis not present

## 2016-08-06 DIAGNOSIS — Z85038 Personal history of other malignant neoplasm of large intestine: Secondary | ICD-10-CM | POA: Diagnosis not present

## 2016-08-16 DIAGNOSIS — M069 Rheumatoid arthritis, unspecified: Secondary | ICD-10-CM | POA: Diagnosis not present

## 2016-08-16 DIAGNOSIS — M792 Neuralgia and neuritis, unspecified: Secondary | ICD-10-CM | POA: Diagnosis not present

## 2016-08-16 DIAGNOSIS — Z79899 Other long term (current) drug therapy: Secondary | ICD-10-CM | POA: Diagnosis not present

## 2016-08-22 DIAGNOSIS — R5381 Other malaise: Secondary | ICD-10-CM | POA: Diagnosis not present

## 2016-08-22 DIAGNOSIS — M0579 Rheumatoid arthritis with rheumatoid factor of multiple sites without organ or systems involvement: Secondary | ICD-10-CM | POA: Diagnosis not present

## 2016-08-22 DIAGNOSIS — I10 Essential (primary) hypertension: Secondary | ICD-10-CM | POA: Diagnosis not present

## 2016-08-22 DIAGNOSIS — E039 Hypothyroidism, unspecified: Secondary | ICD-10-CM | POA: Diagnosis not present

## 2016-08-22 DIAGNOSIS — F320 Major depressive disorder, single episode, mild: Secondary | ICD-10-CM | POA: Diagnosis not present

## 2016-08-22 DIAGNOSIS — R5383 Other fatigue: Secondary | ICD-10-CM | POA: Diagnosis not present

## 2016-09-12 DIAGNOSIS — F320 Major depressive disorder, single episode, mild: Secondary | ICD-10-CM | POA: Diagnosis not present

## 2016-10-05 DIAGNOSIS — M069 Rheumatoid arthritis, unspecified: Secondary | ICD-10-CM | POA: Diagnosis not present

## 2016-10-08 DIAGNOSIS — M069 Rheumatoid arthritis, unspecified: Secondary | ICD-10-CM | POA: Diagnosis not present

## 2016-10-08 DIAGNOSIS — M792 Neuralgia and neuritis, unspecified: Secondary | ICD-10-CM | POA: Diagnosis not present

## 2016-10-08 DIAGNOSIS — Z79899 Other long term (current) drug therapy: Secondary | ICD-10-CM | POA: Diagnosis not present

## 2016-10-15 DIAGNOSIS — M79671 Pain in right foot: Secondary | ICD-10-CM | POA: Diagnosis not present

## 2016-10-15 DIAGNOSIS — M76821 Posterior tibial tendinitis, right leg: Secondary | ICD-10-CM | POA: Diagnosis not present

## 2016-10-15 DIAGNOSIS — M25571 Pain in right ankle and joints of right foot: Secondary | ICD-10-CM | POA: Diagnosis not present

## 2016-10-15 DIAGNOSIS — M19071 Primary osteoarthritis, right ankle and foot: Secondary | ICD-10-CM | POA: Diagnosis not present

## 2016-11-05 DIAGNOSIS — M25571 Pain in right ankle and joints of right foot: Secondary | ICD-10-CM | POA: Diagnosis not present

## 2016-11-05 DIAGNOSIS — M2012 Hallux valgus (acquired), left foot: Secondary | ICD-10-CM | POA: Diagnosis not present

## 2016-11-05 DIAGNOSIS — M79672 Pain in left foot: Secondary | ICD-10-CM | POA: Diagnosis not present

## 2016-11-05 DIAGNOSIS — M2042 Other hammer toe(s) (acquired), left foot: Secondary | ICD-10-CM | POA: Diagnosis not present

## 2016-11-05 DIAGNOSIS — M79671 Pain in right foot: Secondary | ICD-10-CM | POA: Diagnosis not present

## 2016-11-05 DIAGNOSIS — M76821 Posterior tibial tendinitis, right leg: Secondary | ICD-10-CM | POA: Diagnosis not present

## 2016-11-05 DIAGNOSIS — M19071 Primary osteoarthritis, right ankle and foot: Secondary | ICD-10-CM | POA: Diagnosis not present

## 2016-11-13 DIAGNOSIS — J329 Chronic sinusitis, unspecified: Secondary | ICD-10-CM | POA: Diagnosis not present

## 2016-11-13 DIAGNOSIS — R0602 Shortness of breath: Secondary | ICD-10-CM | POA: Diagnosis not present

## 2016-11-13 DIAGNOSIS — J849 Interstitial pulmonary disease, unspecified: Secondary | ICD-10-CM | POA: Diagnosis not present

## 2016-11-13 DIAGNOSIS — J302 Other seasonal allergic rhinitis: Secondary | ICD-10-CM | POA: Diagnosis not present

## 2016-12-05 DIAGNOSIS — Z23 Encounter for immunization: Secondary | ICD-10-CM | POA: Diagnosis not present

## 2016-12-05 DIAGNOSIS — M0579 Rheumatoid arthritis with rheumatoid factor of multiple sites without organ or systems involvement: Secondary | ICD-10-CM | POA: Diagnosis not present

## 2016-12-05 DIAGNOSIS — J302 Other seasonal allergic rhinitis: Secondary | ICD-10-CM | POA: Diagnosis not present

## 2016-12-10 DIAGNOSIS — M792 Neuralgia and neuritis, unspecified: Secondary | ICD-10-CM | POA: Diagnosis not present

## 2016-12-10 DIAGNOSIS — Z79899 Other long term (current) drug therapy: Secondary | ICD-10-CM | POA: Diagnosis not present

## 2016-12-10 DIAGNOSIS — M069 Rheumatoid arthritis, unspecified: Secondary | ICD-10-CM | POA: Diagnosis not present

## 2016-12-12 DIAGNOSIS — H353131 Nonexudative age-related macular degeneration, bilateral, early dry stage: Secondary | ICD-10-CM | POA: Diagnosis not present

## 2016-12-12 DIAGNOSIS — H40012 Open angle with borderline findings, low risk, left eye: Secondary | ICD-10-CM | POA: Diagnosis not present

## 2016-12-13 DIAGNOSIS — I6529 Occlusion and stenosis of unspecified carotid artery: Secondary | ICD-10-CM | POA: Insufficient documentation

## 2016-12-13 DIAGNOSIS — M0579 Rheumatoid arthritis with rheumatoid factor of multiple sites without organ or systems involvement: Secondary | ICD-10-CM | POA: Diagnosis not present

## 2016-12-13 DIAGNOSIS — I10 Essential (primary) hypertension: Secondary | ICD-10-CM | POA: Diagnosis not present

## 2016-12-13 DIAGNOSIS — I480 Paroxysmal atrial fibrillation: Secondary | ICD-10-CM | POA: Diagnosis not present

## 2016-12-13 DIAGNOSIS — Z85038 Personal history of other malignant neoplasm of large intestine: Secondary | ICD-10-CM | POA: Diagnosis not present

## 2016-12-13 DIAGNOSIS — I6523 Occlusion and stenosis of bilateral carotid arteries: Secondary | ICD-10-CM | POA: Diagnosis not present

## 2016-12-24 DIAGNOSIS — Z9889 Other specified postprocedural states: Secondary | ICD-10-CM | POA: Diagnosis not present

## 2016-12-24 DIAGNOSIS — I10 Essential (primary) hypertension: Secondary | ICD-10-CM | POA: Diagnosis not present

## 2016-12-24 DIAGNOSIS — I6523 Occlusion and stenosis of bilateral carotid arteries: Secondary | ICD-10-CM | POA: Diagnosis not present

## 2016-12-24 DIAGNOSIS — I482 Chronic atrial fibrillation: Secondary | ICD-10-CM | POA: Diagnosis not present

## 2016-12-24 DIAGNOSIS — R0602 Shortness of breath: Secondary | ICD-10-CM | POA: Diagnosis not present

## 2016-12-24 DIAGNOSIS — E039 Hypothyroidism, unspecified: Secondary | ICD-10-CM | POA: Diagnosis not present

## 2016-12-24 DIAGNOSIS — I480 Paroxysmal atrial fibrillation: Secondary | ICD-10-CM | POA: Diagnosis not present

## 2016-12-26 DIAGNOSIS — I6523 Occlusion and stenosis of bilateral carotid arteries: Secondary | ICD-10-CM | POA: Diagnosis not present

## 2017-01-01 DIAGNOSIS — I6523 Occlusion and stenosis of bilateral carotid arteries: Secondary | ICD-10-CM | POA: Diagnosis not present

## 2017-01-01 DIAGNOSIS — N3941 Urge incontinence: Secondary | ICD-10-CM | POA: Diagnosis not present

## 2017-01-01 DIAGNOSIS — R339 Retention of urine, unspecified: Secondary | ICD-10-CM | POA: Diagnosis not present

## 2017-01-09 DIAGNOSIS — J849 Interstitial pulmonary disease, unspecified: Secondary | ICD-10-CM | POA: Diagnosis not present

## 2017-01-09 DIAGNOSIS — J329 Chronic sinusitis, unspecified: Secondary | ICD-10-CM | POA: Diagnosis not present

## 2017-01-09 DIAGNOSIS — I6523 Occlusion and stenosis of bilateral carotid arteries: Secondary | ICD-10-CM | POA: Diagnosis not present

## 2017-01-17 DIAGNOSIS — Z85038 Personal history of other malignant neoplasm of large intestine: Secondary | ICD-10-CM | POA: Insufficient documentation

## 2017-01-21 DIAGNOSIS — I10 Essential (primary) hypertension: Secondary | ICD-10-CM | POA: Diagnosis not present

## 2017-01-21 DIAGNOSIS — Z9889 Other specified postprocedural states: Secondary | ICD-10-CM | POA: Diagnosis not present

## 2017-01-21 DIAGNOSIS — I480 Paroxysmal atrial fibrillation: Secondary | ICD-10-CM | POA: Diagnosis not present

## 2017-01-24 DIAGNOSIS — I6523 Occlusion and stenosis of bilateral carotid arteries: Secondary | ICD-10-CM | POA: Diagnosis not present

## 2017-01-24 DIAGNOSIS — I38 Endocarditis, valve unspecified: Secondary | ICD-10-CM | POA: Diagnosis not present

## 2017-02-04 DIAGNOSIS — M069 Rheumatoid arthritis, unspecified: Secondary | ICD-10-CM | POA: Diagnosis not present

## 2017-02-04 DIAGNOSIS — M159 Polyosteoarthritis, unspecified: Secondary | ICD-10-CM | POA: Diagnosis not present

## 2017-02-04 DIAGNOSIS — Z79899 Other long term (current) drug therapy: Secondary | ICD-10-CM | POA: Diagnosis not present

## 2017-02-06 DIAGNOSIS — Z7982 Long term (current) use of aspirin: Secondary | ICD-10-CM | POA: Diagnosis not present

## 2017-02-06 DIAGNOSIS — E039 Hypothyroidism, unspecified: Secondary | ICD-10-CM | POA: Diagnosis not present

## 2017-02-06 DIAGNOSIS — Z85038 Personal history of other malignant neoplasm of large intestine: Secondary | ICD-10-CM | POA: Diagnosis not present

## 2017-02-06 DIAGNOSIS — Z1211 Encounter for screening for malignant neoplasm of colon: Secondary | ICD-10-CM | POA: Diagnosis not present

## 2017-02-06 DIAGNOSIS — K219 Gastro-esophageal reflux disease without esophagitis: Secondary | ICD-10-CM | POA: Diagnosis not present

## 2017-02-06 DIAGNOSIS — I4891 Unspecified atrial fibrillation: Secondary | ICD-10-CM | POA: Diagnosis not present

## 2017-02-06 DIAGNOSIS — I10 Essential (primary) hypertension: Secondary | ICD-10-CM | POA: Diagnosis not present

## 2017-02-06 DIAGNOSIS — F329 Major depressive disorder, single episode, unspecified: Secondary | ICD-10-CM | POA: Diagnosis not present

## 2017-02-06 DIAGNOSIS — Z8673 Personal history of transient ischemic attack (TIA), and cerebral infarction without residual deficits: Secondary | ICD-10-CM | POA: Diagnosis not present

## 2017-02-06 DIAGNOSIS — K5790 Diverticulosis of intestine, part unspecified, without perforation or abscess without bleeding: Secondary | ICD-10-CM | POA: Diagnosis not present

## 2017-02-06 DIAGNOSIS — K5730 Diverticulosis of large intestine without perforation or abscess without bleeding: Secondary | ICD-10-CM | POA: Diagnosis not present

## 2017-02-06 DIAGNOSIS — Z79899 Other long term (current) drug therapy: Secondary | ICD-10-CM | POA: Diagnosis not present

## 2017-02-06 LAB — HM COLONOSCOPY

## 2017-04-15 DIAGNOSIS — I10 Essential (primary) hypertension: Secondary | ICD-10-CM | POA: Diagnosis not present

## 2017-04-15 DIAGNOSIS — Z952 Presence of prosthetic heart valve: Secondary | ICD-10-CM | POA: Diagnosis not present

## 2017-04-15 DIAGNOSIS — D696 Thrombocytopenia, unspecified: Secondary | ICD-10-CM | POA: Diagnosis not present

## 2017-04-15 DIAGNOSIS — E039 Hypothyroidism, unspecified: Secondary | ICD-10-CM | POA: Diagnosis not present

## 2017-04-15 DIAGNOSIS — I482 Chronic atrial fibrillation: Secondary | ICD-10-CM | POA: Diagnosis not present

## 2017-04-15 DIAGNOSIS — N390 Urinary tract infection, site not specified: Secondary | ICD-10-CM | POA: Diagnosis not present

## 2017-04-16 DIAGNOSIS — J849 Interstitial pulmonary disease, unspecified: Secondary | ICD-10-CM | POA: Diagnosis not present

## 2017-04-16 DIAGNOSIS — I482 Chronic atrial fibrillation: Secondary | ICD-10-CM | POA: Diagnosis not present

## 2017-04-16 DIAGNOSIS — R0982 Postnasal drip: Secondary | ICD-10-CM | POA: Diagnosis not present

## 2017-04-16 DIAGNOSIS — R05 Cough: Secondary | ICD-10-CM | POA: Diagnosis not present

## 2017-04-16 DIAGNOSIS — R0602 Shortness of breath: Secondary | ICD-10-CM | POA: Diagnosis not present

## 2017-04-29 DIAGNOSIS — I482 Chronic atrial fibrillation: Secondary | ICD-10-CM | POA: Diagnosis not present

## 2017-05-07 DIAGNOSIS — M159 Polyosteoarthritis, unspecified: Secondary | ICD-10-CM | POA: Diagnosis not present

## 2017-05-07 DIAGNOSIS — M069 Rheumatoid arthritis, unspecified: Secondary | ICD-10-CM | POA: Diagnosis not present

## 2017-05-07 DIAGNOSIS — Z79899 Other long term (current) drug therapy: Secondary | ICD-10-CM | POA: Diagnosis not present

## 2017-06-14 DIAGNOSIS — H353131 Nonexudative age-related macular degeneration, bilateral, early dry stage: Secondary | ICD-10-CM | POA: Diagnosis not present

## 2017-06-14 DIAGNOSIS — H40012 Open angle with borderline findings, low risk, left eye: Secondary | ICD-10-CM | POA: Diagnosis not present

## 2017-06-14 DIAGNOSIS — H4912 Fourth [trochlear] nerve palsy, left eye: Secondary | ICD-10-CM | POA: Diagnosis not present

## 2017-06-14 DIAGNOSIS — H532 Diplopia: Secondary | ICD-10-CM | POA: Diagnosis not present

## 2017-07-01 DIAGNOSIS — J310 Chronic rhinitis: Secondary | ICD-10-CM | POA: Diagnosis not present

## 2017-07-01 DIAGNOSIS — R490 Dysphonia: Secondary | ICD-10-CM | POA: Diagnosis not present

## 2017-07-01 DIAGNOSIS — H6122 Impacted cerumen, left ear: Secondary | ICD-10-CM | POA: Diagnosis not present

## 2017-07-01 DIAGNOSIS — J343 Hypertrophy of nasal turbinates: Secondary | ICD-10-CM | POA: Diagnosis not present

## 2017-07-01 DIAGNOSIS — J387 Other diseases of larynx: Secondary | ICD-10-CM | POA: Diagnosis not present

## 2017-07-01 DIAGNOSIS — R42 Dizziness and giddiness: Secondary | ICD-10-CM | POA: Diagnosis not present

## 2017-07-01 DIAGNOSIS — H903 Sensorineural hearing loss, bilateral: Secondary | ICD-10-CM | POA: Diagnosis not present

## 2017-07-01 DIAGNOSIS — H61893 Other specified disorders of external ear, bilateral: Secondary | ICD-10-CM | POA: Diagnosis not present

## 2017-07-16 DIAGNOSIS — M159 Polyosteoarthritis, unspecified: Secondary | ICD-10-CM | POA: Diagnosis not present

## 2017-07-16 DIAGNOSIS — M0579 Rheumatoid arthritis with rheumatoid factor of multiple sites without organ or systems involvement: Secondary | ICD-10-CM | POA: Diagnosis not present

## 2017-07-16 DIAGNOSIS — Z79899 Other long term (current) drug therapy: Secondary | ICD-10-CM | POA: Diagnosis not present

## 2017-07-16 DIAGNOSIS — M069 Rheumatoid arthritis, unspecified: Secondary | ICD-10-CM | POA: Diagnosis not present

## 2017-07-16 LAB — POCT ERYTHROCYTE SEDIMENTATION RATE, NON-AUTOMATED: Sed Rate: 21

## 2017-08-06 DIAGNOSIS — I482 Chronic atrial fibrillation: Secondary | ICD-10-CM | POA: Diagnosis not present

## 2017-08-06 DIAGNOSIS — Z7901 Long term (current) use of anticoagulants: Secondary | ICD-10-CM | POA: Diagnosis not present

## 2017-08-06 DIAGNOSIS — Z9889 Other specified postprocedural states: Secondary | ICD-10-CM | POA: Diagnosis not present

## 2017-08-06 DIAGNOSIS — I10 Essential (primary) hypertension: Secondary | ICD-10-CM | POA: Diagnosis not present

## 2017-08-06 DIAGNOSIS — E039 Hypothyroidism, unspecified: Secondary | ICD-10-CM | POA: Diagnosis not present

## 2017-08-06 DIAGNOSIS — Z79899 Other long term (current) drug therapy: Secondary | ICD-10-CM | POA: Diagnosis not present

## 2017-09-03 DIAGNOSIS — I10 Essential (primary) hypertension: Secondary | ICD-10-CM | POA: Diagnosis not present

## 2017-09-03 DIAGNOSIS — E782 Mixed hyperlipidemia: Secondary | ICD-10-CM | POA: Insufficient documentation

## 2017-09-03 DIAGNOSIS — J849 Interstitial pulmonary disease, unspecified: Secondary | ICD-10-CM | POA: Diagnosis not present

## 2017-09-03 DIAGNOSIS — E039 Hypothyroidism, unspecified: Secondary | ICD-10-CM | POA: Diagnosis not present

## 2017-09-03 DIAGNOSIS — M0579 Rheumatoid arthritis with rheumatoid factor of multiple sites without organ or systems involvement: Secondary | ICD-10-CM | POA: Diagnosis not present

## 2017-09-03 DIAGNOSIS — I482 Chronic atrial fibrillation: Secondary | ICD-10-CM | POA: Diagnosis not present

## 2017-09-03 LAB — LIPID PANEL
Cholesterol: 132 (ref 0–200)
HDL: 75 — AB (ref 35–70)
LDL Cholesterol: 49
Triglycerides: 55 (ref 40–160)

## 2017-09-03 LAB — BASIC METABOLIC PANEL
BUN: 20 (ref 4–21)
Creatinine: 0.8 (ref 0.5–1.1)
Glucose: 92
Potassium: 4.9 (ref 3.4–5.3)
Sodium: 139 (ref 137–147)

## 2017-09-03 LAB — CBC AND DIFFERENTIAL
HCT: 35 — AB (ref 36–46)
Hemoglobin: 11.9 — AB (ref 12.0–16.0)
Platelets: 158 (ref 150–399)
WBC: 6.1

## 2017-09-23 DIAGNOSIS — J209 Acute bronchitis, unspecified: Secondary | ICD-10-CM | POA: Diagnosis not present

## 2017-10-09 ENCOUNTER — Encounter: Payer: Self-pay | Admitting: Internal Medicine

## 2017-10-09 ENCOUNTER — Non-Acute Institutional Stay: Payer: Medicare Other | Admitting: Internal Medicine

## 2017-10-09 VITALS — BP 120/70 | HR 88 | Temp 98.5°F | Ht 62.0 in | Wt 124.0 lb

## 2017-10-09 DIAGNOSIS — Z8673 Personal history of transient ischemic attack (TIA), and cerebral infarction without residual deficits: Secondary | ICD-10-CM

## 2017-10-09 DIAGNOSIS — M21061 Valgus deformity, not elsewhere classified, right knee: Secondary | ICD-10-CM

## 2017-10-09 DIAGNOSIS — M545 Low back pain, unspecified: Secondary | ICD-10-CM

## 2017-10-09 DIAGNOSIS — D6869 Other thrombophilia: Secondary | ICD-10-CM | POA: Diagnosis not present

## 2017-10-09 DIAGNOSIS — M06049 Rheumatoid arthritis without rheumatoid factor, unspecified hand: Secondary | ICD-10-CM | POA: Diagnosis not present

## 2017-10-09 DIAGNOSIS — M2041 Other hammer toe(s) (acquired), right foot: Secondary | ICD-10-CM | POA: Diagnosis not present

## 2017-10-09 DIAGNOSIS — Z85038 Personal history of other malignant neoplasm of large intestine: Secondary | ICD-10-CM

## 2017-10-09 DIAGNOSIS — G8929 Other chronic pain: Secondary | ICD-10-CM

## 2017-10-09 DIAGNOSIS — I482 Chronic atrial fibrillation, unspecified: Secondary | ICD-10-CM

## 2017-10-09 DIAGNOSIS — D7589 Other specified diseases of blood and blood-forming organs: Secondary | ICD-10-CM | POA: Diagnosis not present

## 2017-10-09 DIAGNOSIS — R05 Cough: Secondary | ICD-10-CM | POA: Diagnosis not present

## 2017-10-09 DIAGNOSIS — G3184 Mild cognitive impairment, so stated: Secondary | ICD-10-CM

## 2017-10-09 DIAGNOSIS — M2042 Other hammer toe(s) (acquired), left foot: Secondary | ICD-10-CM

## 2017-10-09 DIAGNOSIS — I4891 Unspecified atrial fibrillation: Secondary | ICD-10-CM

## 2017-10-09 DIAGNOSIS — N3281 Overactive bladder: Secondary | ICD-10-CM

## 2017-10-09 DIAGNOSIS — Z780 Asymptomatic menopausal state: Secondary | ICD-10-CM | POA: Diagnosis not present

## 2017-10-09 DIAGNOSIS — H9113 Presbycusis, bilateral: Secondary | ICD-10-CM | POA: Diagnosis not present

## 2017-10-09 DIAGNOSIS — H532 Diplopia: Secondary | ICD-10-CM | POA: Diagnosis not present

## 2017-10-09 DIAGNOSIS — R059 Cough, unspecified: Secondary | ICD-10-CM

## 2017-10-09 NOTE — Progress Notes (Signed)
Provider:  Rexene Edison. Mariea Clonts, D.O., C.M.D. Location:   [facility]  Place of Service:   Clinic  Previous PCP:  Alyson Locket. Tamala Julian, [place], [place] [phone] :Patient Care Team: Gayland Curry, DO as PCP - General (Geriatric Medicine)  Extended Emergency Contact Information Primary Emergency Contact: [name] Address: [address], [place] [address] [address] [place] Phone: [phone] Relation: Daughter  Goals of Care: Advanced Directive information Advanced Directives 10/09/2017  Does Patient Have a Medical Advance Directive? Yes  Type of Advance Directive [place]  Does patient want to make changes to medical advance directive? No - Patient declined  Copy of [place] in Chart? Yes    Chief Complaint  Patient presents with  . Establish Care    new admission to [facility]    HPI: Patient is a 84 y.o. female seen today to establish with [facility].  Records are in care everywhere from previous physicians.  She moved here 09/13/17.  She stayed with her daughter, Jillene Bucks.    She then had a fever of 102.  She went to [facility] and she had acute bronchitis over the July 4th holiday.  She stayed with them an extra week, then moved in just before the 4th.  She still has a little cough, but she's not bringing up the mucus that she was.  She has ongoing sinus, nasal congestion, but always clear.  It's actually not as bad here as where she was before.  Feels like she needs to clear her throat but nothing comes up.  [name] less short of breath.  She'd had a shot of abx and 5 days of zpak.  Her chart indicates that a CT scan showed interstitial lung disease.  She did have nebulizers at one time.  Has had spring allergies with pollen, bee stings and mold allergies.    Was living [place].  Just before her move, she was in a smaller [place].  She has moved to be closer to her daughter.    Colon cancer 2007,  lymph node positive, had colon resection of 13 inches of large colon, chemotherapy every 10 days for 7 mos.  She had her cscope last year that was normal and does not need further cscope.  She has a BM nearly every time she urinates.  That's been more recent.  Notes her bms are smaller and softer.  No hematochezia or melena.  No abdominal pain.   They have an appt in [place] with attorney for advance care planning documentation.    Her biggest concern now has been her sob.  She'd felt well until a few things slowed her down.  She used to exercise every day.   She used to walk a mile a day.    She turned her ankle.  No fracture.  It hurt for a while.  She also has arthritis in her knee and the leg is bowing.  Her RA is an inhibitor.  She is on enbrel for her RA after her back surgery.  She feels like it's helped her.  She still has numbness and cramping in her feet and legs.  Her hands have significant deformity.  Her back pain is different than presurgery (that was corrected), but it takes a good 30 mins plus to get moving.  The right knee feels like it's going to buckle.  She avoids stepping up with that knee  on stairs.  There is some pain if she steps wrong.  Takes tylenol 2 capsules twice a day which helps get her going.  She is to avoid nsaids.      She has chronic afib which also affects her breathing and makes her fatigued.  She takes eliquis and baby asa.    She has needed frequent daytime naps lately.  She lies down due to being worn out.  She does sleep well at night.    She also gets up every 2 hours to use the restroom.  It's not terribly full.  It's going on a couple years.  It has not been evaluate due to other priorities.  No incontinence.  There's a slight urgency.  No dysuria.    She had open heart surgery with two valve repairs:  Mitral and tricuspid.    At some point a doctor advised b12 and biotin and she's taken them for a long time.  HTN:  Stable.  BP has been good.  She does c/o  being lightheaded and off balance.  She does try to drink more water.    Husband was physician at [facility] [employer] in training when they met.  He did residency in surgery and then [employer].  He then did a fellowship in urology at [facility] and practiced.     Has been in good spirits.  Says she has the usual difficulty remembering names and recalling certain things.  Her daughter notes in the last 2 years, she's seen a noticeable decline in what she repeats day to day.  Will forget small tasks or directions.  Even at home when they were moving, she had places at home, she could not find.  Pt admits to slippage.  Her daughter is helping her with finances, but she does pay her own bills, keeps track of her checking account.  Investments and big decisions are overwhelming.  She lost her son and her husband in the past decade.    She had a brain scan and the neurologist found 3 small strokes noted.  She'd had an incident where she woke up and her side did not work.  It went away--was in the last 2 years.    Past Medical History:  Diagnosis Date  . Anemia   . Anxiety   . Arthritis    Back  . Atrial fibrillation ([place])   . Cancer [facility]) 2006   Colon.  Basal Cell Skin cancer- right arm  . Colon cancer ([place])   . Constipation due to pain medication therapy    after heart surgery  . Dysrhythmia    PAF  . GERD (gastroesophageal reflux disease)   . Heart murmur   . Hypertension   . Hypothyroidism   . RA (rheumatoid arthritis) ([place])   . Restless leg   . Seizures ([place])    after Heart Surgey  . Stroke [facility])    TIA- found by neurologist after    Past Surgical History:  Procedure Laterality Date  . ABDOMINAL HYSTERECTOMY  1970   Partial   . COLON RESECTION  2006   cancer  . COLONOSCOPY    . EYE SURGERY Bilateral    Cataract  . MAXIMUM ACCESS (MAS)POSTERIOR LUMBAR INTERBODY FUSION (PLIF) 2 LEVEL N/A 04/12/2015   Procedure: Lumbar Three-Five Decompression, Pedicle Screw Fixation, and Posteriolateral  Arthrodesis;  Surgeon: Erline Levine, MD;  Location: [facility];  Service: Neurosurgery;  Laterality: N/A;  L3-4 L4-5 Maximum access posterior lumbar fusion, possible interbodies and  resection of synovial cyst at L4-5  . MITRAL VALVE REPAIR  01/20/13   Gore-tex cords to P1, P2, and P3. Magic suture to posterior medial commisure, #30 Physio 1 ring. Done in [place]  . TONSILLECTOMY     about 1940  . TRICUSPID VALVE SURGERY  01/20/13   #28 TriAd ring done in [place]    Social History   Socioeconomic History  . Marital status: Widowed    Spouse name: Not on file  . Number of children: Not on file  . Years of education: Not on file  . Highest education level: Not on file  Occupational History  . 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 Financial resource strain: Not on file  . Food insecurity:    Worry: Not on file    Inability: Not on file  . Transportation needs:    Medical: Not on file    Non-medical: Not on file  Tobacco Use  . Smoking status: Never Smoker  . Smokeless tobacco: Never Used  Substance and Sexual Activity  . Alcohol use: Yes    Alcohol/week: 0.6 oz    Types: 1 Glasses of wine per week  . Drug use: No  . Sexual activity: Not Currently  Lifestyle  . Physical activity:    Days per week: Not on file    Minutes per session: Not on file  . Stress: Not on file  Relationships  . Social connections:    Talks on phone: Not on file    Gets together: Not on file    Attends religious service: Not on file    Active member of club or organization: Not on file    Attends meetings of clubs or organizations: Not on file    Relationship status: Not on file  Other Topics Concern  . Not on file  Social History Narrative   Social History      Diet? Healthy- low salt, sugar, fat      Do you drink/eat things with caffeine? On occasion      Marital status?        widow                        What year were you married? 1958      Do  you live in a house, apartment, assisted living, condo, trailer, etc.? apartment      Is it one or more stories? one      How many persons live in your home? one      Do you have any pets in your home? (please list) no      Highest level of education completed? 4 year college      Current or past profession: Statistician      Do you exercise?             yes                         Type & how often? Classes- senior retirement community/ some walking      Advanced Directives      Do you have a living will?      Do you have a DNR form?                                  If  not, do you want to discuss one?      Do you have signed POA/HPOA for forms?       Functional Status Completed by: Daughter, Di Kindle      Do you have difficulty bathing or dressing yourself? no      Do you have difficulty preparing food or eating? no      Do you have difficulty managing your medications? no      Do you have difficulty managing your finances? no      Do you have difficulty affording your medications? no    reports that she has never smoked. She has never used smokeless tobacco. She reports that she drinks about 0.6 oz of alcohol per week. She reports that she does not use drugs.  Functional Status Survey:    Family History  Problem Relation Age of Onset  . Lung disease Mother 76  . Alcohol abuse Son   . Heart disease Father 66    Health Maintenance  Topic Date Due  . INFLUENZA VACCINE  10/24/2017  . TETANUS/TDAP  04/17/2027  . DEXA SCAN  Completed  . PNA vac Low Risk Adult  Completed    Allergies  Allergen Reactions  . Codeine Other (See Comments)    "just don't take it well"  . Molds & Smuts   . Pollen Extract Swelling  . Bee Venom Swelling and Rash    Outpatient Encounter Medications as of 10/09/2017  Medication Sig  . Acetaminophen 500 MG coapsule Take 2 capsules by mouth 2 (two) times daily.   Marland Kitchen apixaban (ELIQUIS) 2.5 MG TABS tablet Take 1 tablet (2.5 mg  total) by mouth 2 (two) times daily.  Marland Kitchen aspirin EC 81 MG tablet Take 81 mg by mouth daily.  Marland Kitchen atorvastatin (LIPITOR) 20 MG tablet Take 20 mg by mouth daily.  . Azelastine-Fluticasone 137-50 MCG/ACT SUSP Place into the nose daily as needed.   . Biotin 5000 MCG CAPS Take 1 capsule by mouth daily.  . cholecalciferol (VITAMIN D) 1000 units tablet Take 1,000 Units by mouth daily.  . digoxin (LANOXIN) 0.125 MG tablet Take 0.125 mg by mouth.  Marland Kitchen ipratropium (ATROVENT) 0.03 % nasal spray Place into the nose.  . levothyroxine (SYNTHROID, LEVOTHROID) 75 MCG tablet Take 75 mcg by mouth daily before breakfast. For Hypothyroidism  . loratadine (CLARITIN) 10 MG tablet Take 10 mg by mouth daily.  . magnesium oxide (MAG-OX) 400 MG tablet Take 400 mg by mouth daily.  . metoprolol succinate (TOPROL-XL) 50 MG 24 hr tablet Take 50 mg by mouth daily.   . Multiple Vitamin (MULTIVITAMIN WITH MINERALS) TABS tablet Take 1 tablet by mouth daily.  . Multiple Vitamins-Minerals (PRESERVISION AREDS 2) CAPS Take 1 Dose by mouth 2 (two) times daily. For eye health  . Omega-3 Fatty Acids (OMEGA-3 FISH OIL) 300 MG CAPS Take 1 capsule by mouth 2 (two) times daily.  . polycarbophil (FIBERCON) 625 MG tablet Take 625 mg by mouth daily.  . ranitidine (ZANTAC) 150 MG capsule Take 150 mg by mouth 2 (two) times daily.   . valsartan (DIOVAN) 80 MG tablet Take 80 mg by mouth daily.  . vitamin B-12 (CYANOCOBALAMIN) 1000 MCG tablet Take 1,000 mcg by mouth daily.   . [DISCONTINUED] etanercept (ENBREL SURECLICK) 50 MG/ML injection Inject 50 mg into the skin once a week.  . [DISCONTINUED] fluticasone (FLONASE) 50 MCG/ACT nasal spray Place 1 spray into both nostrils daily.  . [DISCONTINUED] IPRATROPIUM BROMIDE HFA IN Inhale 1 puff into  the lungs daily as needed.  . [DISCONTINUED] metoprolol succinate (TOPROL-XL) 50 MG 24 hr tablet Take 50 mg by mouth 2 (two) times daily. Take with or immediately following a meal.   No facility-administered  encounter medications on file as of 10/09/2017.     Review of Systems  Constitutional: Positive for malaise/fatigue. Negative for chills, diaphoresis, fever and weight loss.  HENT: Positive for hearing loss. Negative for sore throat.        Bilateral hearing aids--new ones needed, needs audiology eval; postnasal drip  Eyes: Positive for double vision. Negative for blurred vision.       Diplopia looking right  Respiratory: Positive for cough. Negative for sputum production, shortness of breath and wheezing.   Cardiovascular: Negative for chest pain, palpitations and leg swelling.  Gastrointestinal: Negative for abdominal pain, blood in stool, constipation, diarrhea, melena, nausea and vomiting.       More loose stools lately  Genitourinary: Positive for frequency. Negative for dysuria, flank pain, hematuria and urgency.       Mild urgency, but not having incontinence  Musculoskeletal: Positive for back pain and joint pain. Negative for falls, myalgias and neck pain.       Valgus right knee, pain; back pain  Skin: Negative for itching and rash.  Neurological: Negative for dizziness and loss of consciousness.  Endo/Heme/Allergies: Bruises/bleeds easily.  Psychiatric/Behavioral: Positive for memory loss. Negative for depression, hallucinations, substance abuse and suicidal ideas. The patient is not nervous/anxious and does not have insomnia.     Vitals:   10/09/17 0925  BP: 120/70  Pulse: 88  Temp: 98.5 F (36.9 C)  TempSrc: Oral  SpO2: 98%  Weight: 124 lb (56.2 kg)  Height: '5\' 2"'  (1.575 m)   Body mass index is 22.68 kg/m. Physical Exam  Constitutional: She is oriented to person, place, and time. She appears well-developed and well-nourished. No distress.  Neatly dressed and groomed  HENT:  Head: Normocephalic and atraumatic.  Right Ear: External ear normal.  Left Ear: External ear normal.  Nose: Nose normal.  Mouth/Throat: Oropharynx is clear and moist. No oropharyngeal  exudate.  No significant cerumen when hearing aids removed, still some hearing loss with hearing aids notable  Eyes: Pupils are equal, round, and reactive to light. Conjunctivae and EOM are normal.  Neck: Neck supple. No JVD present. No thyromegaly present.  Cardiovascular: Intact distal pulses.  irreg irreg  Pulmonary/Chest: Effort normal and breath sounds normal. No respiratory distress. She has no wheezes. She has no rales.  Abdominal: Soft. Bowel sounds are normal. She exhibits no distension and no mass. There is no tenderness. There is no guarding.  Musculoskeletal: Normal range of motion.  Right knee valgus deformity, some mild tenderness; deformity with ulnar deviation of digits of hands, feet with hammertoes and second toe actually crossing great toe on one foot  Lymphadenopathy:    She has no cervical adenopathy.  Neurological: She is alert and oriented to person, place, and time. No cranial nerve deficit. Coordination normal.  Skin: Skin is warm and dry. Capillary refill takes less than 2 seconds.  Psychiatric: She has a normal mood and affect. Her behavior is normal.  Turns to her daughter to help with some history and she did have to correct her a couple of times    Labs reviewed: Basic Metabolic Panel: Recent Labs    09/03/17  NA 139  K 4.9  BUN 20  CREATININE 0.8   Liver Function Tests: No results for input(s): AST,  ALT, ALKPHOS, BILITOT, PROT, ALBUMIN in the last 8760 hours. No results for input(s): LIPASE, AMYLASE in the last 8760 hours. No results for input(s): AMMONIA in the last 8760 hours. CBC: Recent Labs    09/03/17  WBC 6.1  HGB 11.9*  HCT 35*  PLT 158   Cardiac Enzymes: No results for input(s): CKTOTAL, CKMB, CKMBINDEX, TROPONINI in the last 8760 hours. BNP: Invalid input(s): POCBNP Lab Results  Component Value Date   HGBA1C 5.5 01/12/2013  folate >24 07/26/16  Imaging and Procedures noted on new patient packet: 02/13/12 echo in care  everywhere  Assessment/Plan 1. Rheumatoid arthritis involving hand with negative rheumatoid factor, unspecified laterality ([place]) - pt on enbrel, needs regular rheum monitoring - records indicate RF negative - abstracted last available labs in careeverywhere -referral placed to Dr. Amil Amen at family request - Ambulatory referral to Podiatry  2. Chronic bilateral low back pain without sciatica -much better than before her back surgery, but has a different ache across her lower back at times, worst first thing in am  3. Chronic atrial fibrillation (HCC) - has seen Dr. Debara Pickett in the past so will refer back to him at daughter's request, pt is stable -pt had prior mitral and tricuspid valve repairs -on eliquis therapy - Ambulatory referral to Cardiology  4. Postmenopausal - will get new bone density done at the [facility] center especially with her RA and unsteady gait from her valgus deformity - DG Bone Density; Future  5. Diplopia - she's not sure how long she's noticed this when looking right, has had prior cataract surgery, but not clear if it correlates--will refer to Dr. Ellie Lunch to be seen at [facility] - Ambulatory referral to Ophthalmology  6. Presbycusis of both ears - needs new local audiologist and updated hearing aids - Ambulatory referral to Audiology  7. Acquired hammertoes of both feet - noted; also overlapping toes from her moderate to severe RA - may benefit from some orthotics and podiatric advice - Ambulatory referral to Podiatry Dr. Buelah Manis  8. Macrocytosis without anemia -noted; has had iron deficiency anemia in the past but had colon ca that was treated to bowel resection -suspect some absorption issues causing -check b12 and folate levels  9. Cough -some residual from her bronchitis late last month, she accepts it's just taking time to resolve, monitor  10. History of colon cancer, stage III -had lymph node involvement -prior bowel resection, chemo -multiple  cscopes since, last was last year--no further needed for routine screening -she is having some loose bms with each time she urinates, but no other changes, monitor as could be an adjustment to changes in diet in new location  11. Genu valgum, right -referred for PT here to assist with her gait which is unsteady and help with pain and the deformities from her RA  12. Hypercoagulable state due to atrial fibrillation (HCC) -on eliquis, h/h stable as of last month  13. History of multiple strokes -noted on imaging, may have some vascular component to memory loss  14. Mild cognitive impairment with memory loss -her daughter notes moreso over the past two years, working to maintain her mom's independence as long as possible  15. Overactive bladder -at night, has frequency and mild urgency, but not incontinent, no dysuria -suspect OAB -may benefit from myrbetriq but I didn't want to start something new the first visit   Labs/tests ordered:  B12, folate in am Orders Placed This Encounter  Procedures  . HM DEXA SCAN  This external order was created through the Results Console.  Darletta Moll Bone Density    Standing Status:   Future    Standing Expiration Date:   12/11/2018    Order Specific Question:   Reason for Exam (SYMPTOM  OR DIAGNOSIS REQUIRED)    Answer:   postmenopausal, estrogen deficiency    Order Specific Question:   Preferred imaging location?    Answer:   [facility]  . HM HEPATITIS C SCREENING LAB    This external order was created through the Results Console.  . CBC and differential    This external order was created through the Results Console.  . Basic metabolic panel    This external order was created through the Results Console.  . Lipid panel    This external order was created through the Results Console.  . Hemoglobin A1c    This external order was created through the Results Console.  . Ambulatory referral to Cardiology    Referral Priority:   Routine     Referral Type:   Consultation    Referral Reason:   Specialty Services Required    Requested Specialty:   Cardiology    Number of Visits Requested:   1  . Ambulatory referral to Ophthalmology    Referral Priority:   Routine    Referral Type:   Consultation    Referral Reason:   Specialty Services Required    Referred to Provider:   Luberta Mutter, MD    Requested Specialty:   Ophthalmology    Number of Visits Requested:   1  . Ambulatory referral to Audiology    Referral Priority:   Routine    Referral Type:   Audiology Exam    Referral Reason:   Specialty Services Required    Number of Visits Requested:   1  . Ambulatory referral to Podiatry    Referral Priority:   Routine    Referral Type:   Consultation    Referral Reason:   Specialty Services Required    Requested Specialty:   Podiatry    Number of Visits Requested:   1  . POCT erythrocyte sed rate, Non-automated    This external order was created through the Results Console.  Marland Kitchen HM COLONOSCOPY    This external order was created through the Results Console.   12/11/2017 2 month f/u in [place]. Tamarick Kovalcik, D.O. [facility] [address], [place] [address] Cell Phone (Mon-Fri 8am-5pm):  [phone] On Call:  [phone] & follow prompts after 5pm & weekends Office Phone:  [phone] Office Fax:  [phone]

## 2017-10-10 ENCOUNTER — Encounter: Payer: Self-pay | Admitting: Internal Medicine

## 2017-10-10 DIAGNOSIS — D509 Iron deficiency anemia, unspecified: Secondary | ICD-10-CM | POA: Diagnosis not present

## 2017-10-10 DIAGNOSIS — D649 Anemia, unspecified: Secondary | ICD-10-CM | POA: Diagnosis not present

## 2017-10-10 DIAGNOSIS — M06049 Rheumatoid arthritis without rheumatoid factor, unspecified hand: Secondary | ICD-10-CM

## 2017-10-10 DIAGNOSIS — E538 Deficiency of other specified B group vitamins: Secondary | ICD-10-CM | POA: Diagnosis not present

## 2017-10-10 LAB — VITAMIN B12: Vitamin B-12: 1616

## 2017-10-11 ENCOUNTER — Other Ambulatory Visit: Payer: Self-pay | Admitting: Internal Medicine

## 2017-10-11 DIAGNOSIS — M2041 Other hammer toe(s) (acquired), right foot: Secondary | ICD-10-CM | POA: Insufficient documentation

## 2017-10-11 DIAGNOSIS — M2042 Other hammer toe(s) (acquired), left foot: Secondary | ICD-10-CM | POA: Insufficient documentation

## 2017-10-11 DIAGNOSIS — G3184 Mild cognitive impairment, so stated: Secondary | ICD-10-CM | POA: Insufficient documentation

## 2017-10-11 DIAGNOSIS — N3281 Overactive bladder: Secondary | ICD-10-CM | POA: Insufficient documentation

## 2017-10-11 DIAGNOSIS — H9113 Presbycusis, bilateral: Secondary | ICD-10-CM | POA: Insufficient documentation

## 2017-10-11 DIAGNOSIS — D7589 Other specified diseases of blood and blood-forming organs: Secondary | ICD-10-CM | POA: Insufficient documentation

## 2017-10-11 DIAGNOSIS — Z8673 Personal history of transient ischemic attack (TIA), and cerebral infarction without residual deficits: Secondary | ICD-10-CM | POA: Insufficient documentation

## 2017-10-11 DIAGNOSIS — M21061 Valgus deformity, not elsewhere classified, right knee: Secondary | ICD-10-CM | POA: Insufficient documentation

## 2017-10-11 DIAGNOSIS — I482 Chronic atrial fibrillation, unspecified: Secondary | ICD-10-CM | POA: Insufficient documentation

## 2017-10-11 DIAGNOSIS — H532 Diplopia: Secondary | ICD-10-CM | POA: Insufficient documentation

## 2017-10-11 DIAGNOSIS — I4891 Unspecified atrial fibrillation: Secondary | ICD-10-CM | POA: Insufficient documentation

## 2017-10-11 DIAGNOSIS — E2839 Other primary ovarian failure: Secondary | ICD-10-CM

## 2017-10-11 DIAGNOSIS — D6869 Other thrombophilia: Secondary | ICD-10-CM | POA: Insufficient documentation

## 2017-10-14 ENCOUNTER — Encounter: Payer: Self-pay | Admitting: Internal Medicine

## 2017-10-17 DIAGNOSIS — L603 Nail dystrophy: Secondary | ICD-10-CM | POA: Diagnosis not present

## 2017-10-17 DIAGNOSIS — L84 Corns and callosities: Secondary | ICD-10-CM | POA: Diagnosis not present

## 2017-10-17 DIAGNOSIS — M2042 Other hammer toe(s) (acquired), left foot: Secondary | ICD-10-CM | POA: Diagnosis not present

## 2017-10-17 DIAGNOSIS — M2041 Other hammer toe(s) (acquired), right foot: Secondary | ICD-10-CM | POA: Diagnosis not present

## 2017-10-21 ENCOUNTER — Encounter: Payer: Self-pay | Admitting: Internal Medicine

## 2017-11-05 ENCOUNTER — Encounter: Payer: Self-pay | Admitting: Internal Medicine

## 2017-11-13 DIAGNOSIS — Z961 Presence of intraocular lens: Secondary | ICD-10-CM | POA: Diagnosis not present

## 2017-11-13 DIAGNOSIS — H353131 Nonexudative age-related macular degeneration, bilateral, early dry stage: Secondary | ICD-10-CM | POA: Diagnosis not present

## 2017-11-13 DIAGNOSIS — H40023 Open angle with borderline findings, high risk, bilateral: Secondary | ICD-10-CM | POA: Diagnosis not present

## 2017-11-14 ENCOUNTER — Telehealth: Payer: Self-pay | Admitting: Internal Medicine

## 2017-11-14 NOTE — Telephone Encounter (Signed)
Sent staff message to scheduling

## 2017-11-14 NOTE — Telephone Encounter (Signed)
New message    Patient's daughter states that could not find new patient forms in my chart. Would like to know if can send forms to patient's address on file. Please advise.

## 2017-11-20 DIAGNOSIS — H532 Diplopia: Secondary | ICD-10-CM | POA: Diagnosis not present

## 2017-11-20 DIAGNOSIS — M545 Low back pain: Secondary | ICD-10-CM | POA: Diagnosis not present

## 2017-11-20 DIAGNOSIS — M1711 Unilateral primary osteoarthritis, right knee: Secondary | ICD-10-CM | POA: Diagnosis not present

## 2017-11-20 DIAGNOSIS — M0589 Other rheumatoid arthritis with rheumatoid factor of multiple sites: Secondary | ICD-10-CM | POA: Diagnosis not present

## 2017-11-20 DIAGNOSIS — R2689 Other abnormalities of gait and mobility: Secondary | ICD-10-CM | POA: Diagnosis not present

## 2017-11-20 DIAGNOSIS — G3184 Mild cognitive impairment, so stated: Secondary | ICD-10-CM | POA: Diagnosis not present

## 2017-11-20 DIAGNOSIS — R278 Other lack of coordination: Secondary | ICD-10-CM | POA: Diagnosis not present

## 2017-11-20 DIAGNOSIS — M21061 Valgus deformity, not elsewhere classified, right knee: Secondary | ICD-10-CM | POA: Diagnosis not present

## 2017-11-20 DIAGNOSIS — Z8673 Personal history of transient ischemic attack (TIA), and cerebral infarction without residual deficits: Secondary | ICD-10-CM | POA: Diagnosis not present

## 2017-11-21 ENCOUNTER — Ambulatory Visit
Admission: RE | Admit: 2017-11-21 | Discharge: 2017-11-21 | Disposition: A | Discharge status: Home / Self Care | Payer: Medicare Other | Source: Ambulatory Visit | Attending: Internal Medicine | Admitting: Internal Medicine

## 2017-11-21 DIAGNOSIS — E2839 Other primary ovarian failure: Secondary | ICD-10-CM

## 2017-11-21 DIAGNOSIS — M85832 Other specified disorders of bone density and structure, left forearm: Secondary | ICD-10-CM | POA: Diagnosis not present

## 2017-11-21 DIAGNOSIS — Z780 Asymptomatic menopausal state: Secondary | ICD-10-CM | POA: Diagnosis not present

## 2017-11-22 ENCOUNTER — Encounter: Payer: Self-pay | Admitting: Internal Medicine

## 2017-11-22 ENCOUNTER — Ambulatory Visit (INDEPENDENT_AMBULATORY_CARE_PROVIDER_SITE_OTHER): Payer: Medicare Other | Admitting: Internal Medicine

## 2017-11-22 VITALS — BP 122/78 | HR 83 | Ht 62.0 in | Wt 124.6 lb

## 2017-11-22 DIAGNOSIS — Z9889 Other specified postprocedural states: Secondary | ICD-10-CM | POA: Diagnosis not present

## 2017-11-22 DIAGNOSIS — I482 Chronic atrial fibrillation: Secondary | ICD-10-CM

## 2017-11-22 DIAGNOSIS — I4821 Permanent atrial fibrillation: Secondary | ICD-10-CM

## 2017-11-22 NOTE — Progress Notes (Signed)
OFFICE NOTE  Chief Complaint:  Establish cardiologist  Primary Care Physician: Gayland Curry, DO  HPI:  Kathy Velez is a 84 y.o. female with a past medial history significant for valvular heart disease status post mitral and tricuspid valve repair in 2015 at [facility] cardiology in the [place] area, history of permanent atrial fibrillation on anticoagulation, history of TIAs and several other medical problems including rheumatoid arthritis, who recently moved to [place] to be closer to her daughter.  She established care with Dr. Mariea Clonts and was referred to me for cardiology.  I had previously seen her in the hospital in January 2017 for atrial flutter and chest pain at the request of Dr. Vertell Limber.  Records indicate she had mitral valve repair in 2014 with Gore-Tex cords and a 30 mm physio-ring and a tricuspid repair with a 28 mm triad ring.  Her last cardiac catheterization was in 2014 prior to repair which was facilitated by intra-aortic balloon pump.  Her coronary arteries were noted to be normal.  In 2017 she underwent lumbar decompression and surgery by Dr. Vertell Limber.  Since that time she is done fairly well although has had progressive degenerative rheumatoid arthritis.  She denies any recurrent chest pain.  She has been in persistent A. fib.  EKG shows rate controlled A. fib today.  She is on Eliquis for anticoagulation and aspirin.  PMHx:  Past Medical History:  Diagnosis Date  . Anemia   . Anxiety   . Arthritis    Back  . Atrial fibrillation ([place])   . Cancer [facility]) 2006   Colon.  Basal Cell Skin cancer- right arm  . Colon cancer ([place])   . Constipation due to pain medication therapy    after heart surgery  . Dysrhythmia    PAF  . GERD (gastroesophageal reflux disease)   . Heart murmur   . Hypertension   . Hypothyroidism   . RA (rheumatoid arthritis) ([place])   . Restless leg   . Seizures ([place])    after Heart Surgey  . Stroke [facility])    TIA- found by neurologist after     Past  Surgical History:  Procedure Laterality Date  . ABDOMINAL HYSTERECTOMY  1970   Partial   . COLON RESECTION  2006   cancer  . COLONOSCOPY    . EYE SURGERY Bilateral    Cataract  . MAXIMUM ACCESS (MAS)POSTERIOR LUMBAR INTERBODY FUSION (PLIF) 2 LEVEL N/A 04/12/2015   Procedure: Lumbar Three-Five Decompression, Pedicle Screw Fixation, and Posteriolateral Arthrodesis;  Surgeon: Erline Levine, MD;  Location: [facility];  Service: Neurosurgery;  Laterality: N/A;  L3-4 L4-5 Maximum access posterior lumbar fusion, possible interbodies and resection of synovial cyst at L4-5  . MITRAL VALVE REPAIR  01/20/13   Gore-tex cords to P1, P2, and P3. Magic suture to posterior medial commisure, #30 Physio 1 ring. Done in [place]  . TONSILLECTOMY     about 1940  . TRICUSPID VALVE SURGERY  01/20/13   #28 TriAd ring done in [place]    FAMHx:  Family History  Problem Relation Age of Onset  . Lung disease Mother 51  . Alcohol abuse Son   . Heart disease Father 58    SOCHx:   reports that she has never smoked. She has never used smokeless tobacco. She reports that she drinks about 1.0 standard drinks of alcohol per week. She reports that she does not use drugs.  ALLERGIES:  Allergies  Allergen Reactions  . Codeine Other (See Comments)    "  just don't take it well"  . Molds & Smuts   . Pollen Extract Swelling  . Bee Venom Swelling and Rash    ROS: Pertinent items noted in HPI and remainder of comprehensive ROS otherwise negative.  HOME MEDS: Current Outpatient Medications on File Prior to Visit  Medication Sig Dispense Refill  . Acetaminophen 500 MG coapsule Take 2 capsules by mouth 2 (two) times daily.     Marland Kitchen apixaban (ELIQUIS) 2.5 MG TABS tablet Take 1 tablet (2.5 mg total) by mouth 2 (two) times daily. 60 tablet 0  . aspirin EC 81 MG tablet Take 81 mg by mouth daily.    Marland Kitchen atorvastatin (LIPITOR) 20 MG tablet Take 20 mg by mouth daily.    . Azelastine-Fluticasone 137-50 MCG/ACT SUSP Place into  the nose daily as needed.     . Biotin 5000 MCG CAPS Take 1 capsule by mouth daily.    . cholecalciferol (VITAMIN D) 1000 units tablet Take 1,000 Units by mouth daily.    . digoxin (LANOXIN) 0.125 MG tablet Take 0.125 mg by mouth.    Marland Kitchen ipratropium (ATROVENT) 0.03 % nasal spray Place into the nose.    . levothyroxine (SYNTHROID, LEVOTHROID) 75 MCG tablet Take 75 mcg by mouth daily before breakfast. For Hypothyroidism    . loratadine (CLARITIN) 10 MG tablet Take 10 mg by mouth daily.    . magnesium oxide (MAG-OX) 400 MG tablet Take 400 mg by mouth daily.    . metoprolol succinate (TOPROL-XL) 50 MG 24 hr tablet Take 50 mg by mouth daily.     . Multiple Vitamin (MULTIVITAMIN WITH MINERALS) TABS tablet Take 1 tablet by mouth daily.    . Multiple Vitamins-Minerals (PRESERVISION AREDS 2) CAPS Take 1 Dose by mouth 2 (two) times daily. For eye health    . Omega-3 Fatty Acids (OMEGA-3 FISH OIL) 300 MG CAPS Take 1 capsule by mouth 2 (two) times daily.    . polycarbophil (FIBERCON) 625 MG tablet Take 625 mg by mouth daily.    . ranitidine (ZANTAC) 150 MG capsule Take 150 mg by mouth 2 (two) times daily.     . valsartan (DIOVAN) 80 MG tablet Take 80 mg by mouth daily.    . vitamin B-12 (CYANOCOBALAMIN) 1000 MCG tablet Take 1,000 mcg by mouth daily.      No current facility-administered medications on file prior to visit.     LABS/IMAGING: No results found for this or any previous visit (from the past 48 hour(s)). Dg Bone Density  Result Date: 11/21/2017 EXAM: DUAL X-RAY ABSORPTIOMETRY (DXA) FOR BONE MINERAL DENSITY IMPRESSION: Referring Physician:  Rexene Edison REED Your patient completed a BMD test using Lunar IDXA DXA system ( analysis version: 16 ) manufactured by [employer]. PATIENT: Name: Kathy Velez, Kathy Velez Patient ID: [id number] Birth Date: [birth year] Height: 61.5 in. Sex: Female Measured: 11/21/2017 Weight: 123.6 lbs. Indications: Advanced Age, Caucasian, Estrogen Deficient, Height Loss (781.91),  Hypothyroid, Hysterectomy, Postmenopausal, zantac Fractures: None Treatments: Calcium (E943.0), Vitamin D (E933.5) ASSESSMENT: The BMD measured at Forearm Radius 33% is 0.719 g/cm2 with a T-score of -1.9. This patient is considered osteopenic according to [facility]) criteria. The scan quality is good. Lumbar spine was not utilized due to surgical hardware. Site Region Measured Date Measured Age YA BMD Significant CHANGE T-score Left Forearm Radius 33% 11/21/2017 84.0 -1.9 0.719 g/cm2 DualFemur Neck Right 11/21/2017 84.0 -0.7 0.945 g/cm2 DualFemur Total Mean 11/21/2017 84.0 -0.4 0.956 g/cm2 World Health Organization [facility]) criteria for post-menopausal, Caucasian Women:  Normal       T-score at or above -1 SD Osteopenia   T-score between -1 and -2.5 SD Osteoporosis T-score at or below -2.5 SD RECOMMENDATION: 1. All patients should optimize calcium and vitamin D intake. 2. Consider FDA approved medical therapies in postmenopausal women and men aged 62 years and older, based on the following: a. A hip or vertebral (clinical or morphometric) fracture b. T- score < or = -2.5 at the femoral neck or spine after appropriate evaluation to exclude secondary causes c. Low bone mass (T-score between -1.0 and -2.5 at the femoral neck or spine) and a 10 year probability of a hip fracture > or = 3% or a 10 year probability of a major osteoporosis-related fracture > or = 20% based on the [place]-adapted WHO algorithm d. Clinician judgment and/or patient preferences may indicate treatment for people with 10-year fracture probabilities above or below these levels FOLLOW-UP: People with diagnosed cases of osteoporosis or at high risk for fracture should have regular bone mineral density tests. For patients eligible for Medicare, routine testing is allowed once every 2 years. The testing frequency can be increased to one year for patients who have rapidly progressing disease, those who are receiving or discontinuing  medical therapy to restore bone mass, or have additional risk factors. I have reviewed this report and agree with the above findings. [facility] FRAX* 10-year Probability of Fracture Based on femoral neck BMD: DualFemur (Right) Major Osteoporotic Fracture: 9.9% Hip Fracture:                2.1% Population:                  [place] (Caucasian) Risk Factors:                None *FRAX is a Materials engineer of the [employer] for Metabolic Bone Disease, a [place] (WHO) [facility]. ASSESSMENT: The probability of a major osteoporotic fracture is 9.9 % within the next ten years. The probability of hip fracture is  2.1  % within the next 10 years. Electronically Signed   By: Marijo Conception, M.D.   On: 11/21/2017 15:19    LIPID PANEL:    Component Value Date/Time   CHOL 132 09/03/2017   TRIG 55 09/03/2017   HDL 75 (A) 09/03/2017   LDLCALC 49 09/03/2017     WEIGHTS: Wt Readings from Last 3 Encounters:  11/22/17 124 lb 9.6 oz (56.5 kg)  10/09/17 124 lb (56.2 kg)  05/05/15 119 lb (54 kg)    VITALS: BP 122/78   Pulse 83   Ht 5\' 2"  (1.575 m)   Wt 124 lb 9.6 oz (56.5 kg)   BMI 22.79 kg/m   EXAM: General appearance: alert and no distress Neck: no carotid bruit, no JVD and thyroid not enlarged, symmetric, no tenderness/mass/nodules Lungs: clear to auscultation bilaterally Heart: irregularly irregular rhythm, S1, S2 normal and systolic murmur: early systolic 2/6, blowing at 2nd right intercostal space Abdomen: soft, non-tender; bowel sounds normal; no masses,  no organomegaly Extremities: Significant ulnar deviation of the phalanges and rheumatoid arthritis changes Pulses: 2+ and symmetric Skin: Skin color, texture, turgor normal. No rashes or lesions Neurologic: Grossly normal Psych: Pleasant, slow speech  EKG: A. fib with PVCs at 83, nonspecific ST and T wave changes- personally reviewed  ASSESSMENT: 1. Permanent A. fib on  Eliquis (CHADSVASC score 6) 2. History of MVR/TVR-2015, Dr. Alroy Dust 3. History of TIA 4.  Hypertension 5. Degenerative rheumatoid arthritis  PLAN: 1.   Ms. Nadeau has permanent A. fib on Eliquis.  She has a history of MVR and TVR in 2015, but it is difficult to know if this is valvular AF although she is been persistently in A. fib since that time.  Eliquis does not have clear indication for valvular AF although she has been stroke free.  She is additionally on aspirin for recurrent TIA.  She does not need it for coronary disease as she was not noted to have any.  Blood pressure is been well controlled.  Unfortunately RA is quite deforming.  I would recommend we continue on her current medications.  Thanks for re-referring her.  Follow-up with me in 6 months.  Pixie Casino, MD, [facility], [place] Director of the Advanced Lipid Disorders &  Cardiovascular Risk Reduction Clinic Diplomate of the American Board of Clinical Lipidology Attending Cardiologist  Direct Dial: [phone]  Fax: [phone]  Website:  [email or web address] Katira Dumais 11/22/2017, 5:07 PM

## 2017-11-22 NOTE — Patient Instructions (Signed)
Your physician wants you to follow-up in: 6 months with Dr. Hilty. You will receive a reminder letter in the mail two months in advance. If you don't receive a letter, please call our office to schedule the follow-up appointment.    

## 2017-11-26 DIAGNOSIS — R2689 Other abnormalities of gait and mobility: Secondary | ICD-10-CM | POA: Diagnosis not present

## 2017-11-26 DIAGNOSIS — H532 Diplopia: Secondary | ICD-10-CM | POA: Diagnosis not present

## 2017-11-26 DIAGNOSIS — M21061 Valgus deformity, not elsewhere classified, right knee: Secondary | ICD-10-CM | POA: Diagnosis not present

## 2017-11-26 DIAGNOSIS — M1711 Unilateral primary osteoarthritis, right knee: Secondary | ICD-10-CM | POA: Diagnosis not present

## 2017-11-26 DIAGNOSIS — Z8673 Personal history of transient ischemic attack (TIA), and cerebral infarction without residual deficits: Secondary | ICD-10-CM | POA: Diagnosis not present

## 2017-11-26 DIAGNOSIS — M0589 Other rheumatoid arthritis with rheumatoid factor of multiple sites: Secondary | ICD-10-CM | POA: Diagnosis not present

## 2017-11-26 DIAGNOSIS — R278 Other lack of coordination: Secondary | ICD-10-CM | POA: Diagnosis not present

## 2017-11-26 DIAGNOSIS — M545 Low back pain: Secondary | ICD-10-CM | POA: Diagnosis not present

## 2017-11-26 DIAGNOSIS — G3184 Mild cognitive impairment, so stated: Secondary | ICD-10-CM | POA: Diagnosis not present

## 2017-11-28 DIAGNOSIS — M0589 Other rheumatoid arthritis with rheumatoid factor of multiple sites: Secondary | ICD-10-CM | POA: Diagnosis not present

## 2017-11-28 DIAGNOSIS — M545 Low back pain: Secondary | ICD-10-CM | POA: Diagnosis not present

## 2017-11-28 DIAGNOSIS — R278 Other lack of coordination: Secondary | ICD-10-CM | POA: Diagnosis not present

## 2017-11-28 DIAGNOSIS — G3184 Mild cognitive impairment, so stated: Secondary | ICD-10-CM | POA: Diagnosis not present

## 2017-11-28 DIAGNOSIS — H532 Diplopia: Secondary | ICD-10-CM | POA: Diagnosis not present

## 2017-11-28 DIAGNOSIS — R2689 Other abnormalities of gait and mobility: Secondary | ICD-10-CM | POA: Diagnosis not present

## 2017-12-02 ENCOUNTER — Telehealth: Payer: Self-pay | Admitting: *Deleted

## 2017-12-02 NOTE — Telephone Encounter (Signed)
Received Paperwork from [employer] [id number] [facility] [place]) regarding patient wanting to participate in a study to learn more about how focus of attention affects balance in order to reduce fall risk in older adults. Paperwork placed in Dr. Cyndi Lennert folder to review and sign. To be faxed back to [facility] Fax: [phone]

## 2017-12-03 DIAGNOSIS — G3184 Mild cognitive impairment, so stated: Secondary | ICD-10-CM | POA: Diagnosis not present

## 2017-12-03 DIAGNOSIS — R278 Other lack of coordination: Secondary | ICD-10-CM | POA: Diagnosis not present

## 2017-12-03 DIAGNOSIS — R2689 Other abnormalities of gait and mobility: Secondary | ICD-10-CM | POA: Diagnosis not present

## 2017-12-03 DIAGNOSIS — M545 Low back pain: Secondary | ICD-10-CM | POA: Diagnosis not present

## 2017-12-03 DIAGNOSIS — M0589 Other rheumatoid arthritis with rheumatoid factor of multiple sites: Secondary | ICD-10-CM | POA: Diagnosis not present

## 2017-12-03 DIAGNOSIS — H532 Diplopia: Secondary | ICD-10-CM | POA: Diagnosis not present

## 2017-12-05 DIAGNOSIS — M545 Low back pain: Secondary | ICD-10-CM | POA: Diagnosis not present

## 2017-12-05 DIAGNOSIS — M0589 Other rheumatoid arthritis with rheumatoid factor of multiple sites: Secondary | ICD-10-CM | POA: Diagnosis not present

## 2017-12-05 DIAGNOSIS — G3184 Mild cognitive impairment, so stated: Secondary | ICD-10-CM | POA: Diagnosis not present

## 2017-12-05 DIAGNOSIS — R278 Other lack of coordination: Secondary | ICD-10-CM | POA: Diagnosis not present

## 2017-12-05 DIAGNOSIS — H532 Diplopia: Secondary | ICD-10-CM | POA: Diagnosis not present

## 2017-12-05 DIAGNOSIS — R2689 Other abnormalities of gait and mobility: Secondary | ICD-10-CM | POA: Diagnosis not present

## 2017-12-09 DIAGNOSIS — G3184 Mild cognitive impairment, so stated: Secondary | ICD-10-CM | POA: Diagnosis not present

## 2017-12-09 DIAGNOSIS — R2689 Other abnormalities of gait and mobility: Secondary | ICD-10-CM | POA: Diagnosis not present

## 2017-12-09 DIAGNOSIS — M0589 Other rheumatoid arthritis with rheumatoid factor of multiple sites: Secondary | ICD-10-CM | POA: Diagnosis not present

## 2017-12-09 DIAGNOSIS — M545 Low back pain: Secondary | ICD-10-CM | POA: Diagnosis not present

## 2017-12-09 DIAGNOSIS — R278 Other lack of coordination: Secondary | ICD-10-CM | POA: Diagnosis not present

## 2017-12-09 DIAGNOSIS — H532 Diplopia: Secondary | ICD-10-CM | POA: Diagnosis not present

## 2017-12-11 ENCOUNTER — Non-Acute Institutional Stay: Payer: Medicare Other | Admitting: Internal Medicine

## 2017-12-11 ENCOUNTER — Encounter: Payer: Self-pay | Admitting: Internal Medicine

## 2017-12-11 VITALS — BP 112/60 | HR 86 | Temp 98.5°F | Ht 62.0 in | Wt 128.0 lb

## 2017-12-11 DIAGNOSIS — I482 Chronic atrial fibrillation, unspecified: Secondary | ICD-10-CM

## 2017-12-11 DIAGNOSIS — M545 Low back pain, unspecified: Secondary | ICD-10-CM

## 2017-12-11 DIAGNOSIS — E538 Deficiency of other specified B group vitamins: Secondary | ICD-10-CM

## 2017-12-11 DIAGNOSIS — G3184 Mild cognitive impairment, so stated: Secondary | ICD-10-CM

## 2017-12-11 DIAGNOSIS — M85839 Other specified disorders of bone density and structure, unspecified forearm: Secondary | ICD-10-CM

## 2017-12-11 DIAGNOSIS — M170 Bilateral primary osteoarthritis of knee: Secondary | ICD-10-CM | POA: Diagnosis not present

## 2017-12-11 DIAGNOSIS — G8929 Other chronic pain: Secondary | ICD-10-CM | POA: Diagnosis not present

## 2017-12-11 DIAGNOSIS — M06049 Rheumatoid arthritis without rheumatoid factor, unspecified hand: Secondary | ICD-10-CM

## 2017-12-11 MED ORDER — DIGOXIN 125 MCG PO TABS: 0.1250 mg | ORAL_TABLET | Freq: Every day | ORAL | 0 refills | Status: DC

## 2017-12-11 MED ORDER — DIGOXIN 125 MCG PO TABS: 0.1250 mg | ORAL_TABLET | Freq: Every day | ORAL | 3 refills | Status: DC

## 2017-12-11 NOTE — Progress Notes (Signed)
Location:  Occupational psychologist of Service:  Clinic (12)  Provider: Deshawn Skelley L. Mariea Clonts, D.O., C.M.D.  Goals of Care:  Advanced Directives 10/09/2017  Does Patient Have a Medical Advance Directive? Yes  Type of Advance Directive [place]  Does patient want to make changes to medical advance directive? No - Patient declined  Copy of [place] in Chart? Yes     Chief Complaint  Patient presents with  . Medical Management of Chronic Issues    52mth follow-up    HPI: Patient is a 84 y.o. female seen today for medical management of chronic diseases.    She had just established with me in July.  She was referred to all of the relevant specialists that her daughter requested be on board.    She had her bone density done.  Had osteopenia.  [name] doing Robin's classes here at [place] for weightbearing exercise.    Has not yet seen rheumatology--is in October.  Reports her knees have also gotten worse--it's harder to change positions.  Takes two tylenol once in the morning, once in a while also at night.  Doesn't use any topicals on her knees.  They bother her during squats so she's not doing that part per therapy recommendations.  [name] going to try biofreeze on her knees again (used in the past).  Her back has also been bothering her lately.  It's different than what she had with surgery. Says it's more across and muscular vs the pinched nerve and sciatica she had before her back surgery.    Saw Dr. Debara Pickett.  He continues her same medications for her afib.    She went [place] a group and [place] with Robin.    Mentions that she's had some acid indigestion which is not new or worse but she's heard bad things about zantac and protonix so does not like taking them.  Says she likes the food and eats well.  Usually has largest meal at lunch.  Wishes she could take fewer pills.  Has throat clearing and  sneezing. Mucus still has a little color left.   Waking every few hours.  Sometimes can't go back to sleep after urinating.  Will read or watch tv.  Does not nap in the daytime.  Has taken some melatonin, but she's not sure it does her any good.  Discussed allergies which she knows she has to pollen.  Takes claritin generic.  Uses some nasal spray but not consistent.  Uses as needed.    Past Medical History:  Diagnosis Date  . Anemia   . Anxiety   . Arthritis    Back  . Atrial fibrillation ([place])   . Cancer [facility]) 2006   Colon.  Basal Cell Skin cancer- right arm  . Colon cancer ([place])   . Constipation due to pain medication therapy    after heart surgery  . Dysrhythmia    PAF  . GERD (gastroesophageal reflux disease)   . Heart murmur   . Hypertension   . Hypothyroidism   . RA (rheumatoid arthritis) ([place])   . Restless leg   . Seizures ([place])    after Heart Surgey  . Stroke [facility])    TIA- found by neurologist after     Past Surgical History:  Procedure Laterality Date  . ABDOMINAL HYSTERECTOMY  1970   Partial   . COLON RESECTION  2006   cancer  .  COLONOSCOPY    . EYE SURGERY Bilateral    Cataract  . MAXIMUM ACCESS (MAS)POSTERIOR LUMBAR INTERBODY FUSION (PLIF) 2 LEVEL N/A 04/12/2015   Procedure: Lumbar Three-Five Decompression, Pedicle Screw Fixation, and Posteriolateral Arthrodesis;  Surgeon: Erline Levine, MD;  Location: [place] NEURO ORS;  Service: Neurosurgery;  Laterality: N/A;  L3-4 L4-5 Maximum access posterior lumbar fusion, possible interbodies and resection of synovial cyst at L4-5  . MITRAL VALVE REPAIR  01/20/13   Gore-tex cords to P1, P2, and P3. Magic suture to posterior medial commisure, #30 Physio 1 ring. Done in [place]  . TONSILLECTOMY     about 1940  . TRICUSPID VALVE SURGERY  01/20/13   #28 TriAd ring done in [place]    Allergies  Allergen Reactions  . Codeine Other (See Comments)    "just don't take it well"  . Molds & Smuts   . Pollen Extract Swelling  .  Bee Venom Swelling and Rash    Outpatient Encounter Medications as of 12/11/2017  Medication Sig  . Acetaminophen 500 MG coapsule Take 2 capsules by mouth 2 (two) times daily.   Marland Kitchen apixaban (ELIQUIS) 2.5 MG TABS tablet Take 1 tablet (2.5 mg total) by mouth 2 (two) times daily.  Marland Kitchen aspirin EC 81 MG tablet Take 81 mg by mouth daily.  Marland Kitchen atorvastatin (LIPITOR) 20 MG tablet Take 20 mg by mouth daily.  . Azelastine-Fluticasone 137-50 MCG/ACT SUSP Place into the nose daily as needed.   . Biotin 5000 MCG CAPS Take 1 capsule by mouth daily.  . cholecalciferol (VITAMIN D) 1000 units tablet Take 1,000 Units by mouth daily.  . digoxin (LANOXIN) 0.125 MG tablet Take 0.125 mg by mouth.  Marland Kitchen ipratropium (ATROVENT) 0.03 % nasal spray Place into the nose.  . levothyroxine (SYNTHROID, LEVOTHROID) 75 MCG tablet Take 75 mcg by mouth daily before breakfast. For Hypothyroidism  . loratadine (CLARITIN) 10 MG tablet Take 10 mg by mouth daily.  . magnesium oxide (MAG-OX) 400 MG tablet Take 400 mg by mouth daily.  . metoprolol succinate (TOPROL-XL) 50 MG 24 hr tablet Take 50 mg by mouth daily.   . Multiple Vitamin (MULTIVITAMIN WITH MINERALS) TABS tablet Take 1 tablet by mouth daily.  . Multiple Vitamins-Minerals (PRESERVISION AREDS 2) CAPS Take 1 Dose by mouth 2 (two) times daily. For eye health  . Omega-3 Fatty Acids (OMEGA-3 FISH OIL) 300 MG CAPS Take 1 capsule by mouth 2 (two) times daily.  . polycarbophil (FIBERCON) 625 MG tablet Take 625 mg by mouth daily.  . ranitidine (ZANTAC) 150 MG capsule Take 150 mg by mouth 2 (two) times daily.   . valsartan (DIOVAN) 80 MG tablet Take 80 mg by mouth daily.  . vitamin B-12 (CYANOCOBALAMIN) 1000 MCG tablet Take 1,000 mcg by mouth daily.    No facility-administered encounter medications on file as of 12/11/2017.     Review of Systems:  Review of Systems  Constitutional: Positive for malaise/fatigue. Negative for chills and fever.  HENT: Negative for congestion.   Eyes:  Negative for blurred vision.  Respiratory: Negative for cough and shortness of breath.   Cardiovascular: Negative for chest pain, palpitations and leg swelling.  Gastrointestinal: Positive for heartburn. Negative for abdominal pain, blood in stool, constipation, diarrhea, melena, nausea and vomiting.  Genitourinary: Negative for dysuria.  Musculoskeletal: Positive for back pain and joint pain. Negative for falls.  Neurological: Negative for dizziness and loss of consciousness.  Psychiatric/Behavioral: Positive for memory loss. Negative for depression. The patient is not nervous/anxious and  does not have insomnia.        Sleep disturbances with urinating    Health Maintenance  Topic Date Due  . INFLUENZA VACCINE  10/24/2017  . TETANUS/TDAP  04/17/2027  . DEXA SCAN  Completed  . PNA vac Low Risk Adult  Completed    Physical Exam: Vitals:   12/11/17 1324  BP: 112/60  Pulse: 86  Temp: 98.5 F (36.9 C)  TempSrc: Oral  SpO2: 98%  Weight: 128 lb (58.1 kg)  Height: 5\' 2"  (1.575 m)   Body mass index is 23.41 kg/m. Physical Exam  Constitutional: She is oriented to person, place, and time. She appears well-developed and well-nourished. No distress.  HENT:  Head: Normocephalic and atraumatic.  Cardiovascular:  irreg irreg  Pulmonary/Chest: Effort normal and breath sounds normal. No respiratory distress.  Abdominal: Soft. Bowel sounds are normal. She exhibits no distension. There is no tenderness.  Musculoskeletal:  Significant deformity of joints of hands and feet, warmth and swelling of knees bilaterally  Neurological: She is alert and oriented to person, place, and time.  But poor historian about some things  Skin: Skin is warm and dry. Capillary refill takes less than 2 seconds.  Psychiatric: She has a normal mood and affect.    Labs reviewed: Basic Metabolic Panel: Recent Labs    09/03/17  NA 139  K 4.9  BUN 20  CREATININE 0.8   Liver Function Tests: No results for  input(s): AST, ALT, ALKPHOS, BILITOT, PROT, ALBUMIN in the last 8760 hours. No results for input(s): LIPASE, AMYLASE in the last 8760 hours. No results for input(s): AMMONIA in the last 8760 hours. CBC: Recent Labs    09/03/17  WBC 6.1  HGB 11.9*  HCT 35*  PLT 158   Lipid Panel: Recent Labs    09/03/17  CHOL 132  HDL 75*  LDLCALC 49  TRIG 55   Lab Results  Component Value Date   HGBA1C 5.5 01/12/2013    Procedures since last visit: Dg Bone Density  Result Date: 11/21/2017 EXAM: DUAL X-RAY ABSORPTIOMETRY (DXA) FOR BONE MINERAL DENSITY IMPRESSION: Referring Physician:  [place] Your patient completed a BMD test using Lunar IDXA DXA system ( analysis version: 16 ) manufactured by [employer]. PATIENT: Name: Kathy Velez, Kathy Velez Patient ID: [id number] Birth Date: 12-13-1931 Height: 61.5 in. Sex: Female Measured: 11/21/2017 Weight: 123.6 lbs. Indications: Advanced Age, Caucasian, Estrogen Deficient, Height Loss (781.91), Hypothyroid, Hysterectomy, Postmenopausal, zantac Fractures: None Treatments: Calcium (E943.0), Vitamin D (E933.5) ASSESSMENT: The BMD measured at Forearm Radius 33% is 0.719 g/cm2 with a T-score of -1.9. This patient is considered osteopenic according to [facility]) criteria. The scan quality is good. Lumbar spine was not utilized due to surgical hardware. Site Region Measured Date Measured Age YA BMD Significant CHANGE T-score Left Forearm Radius 33% 11/21/2017 84.0 -1.9 0.719 g/cm2 DualFemur Neck Right 11/21/2017 84.0 -0.7 0.945 g/cm2 DualFemur Total Mean 11/21/2017 84.0 -0.4 0.956 g/cm2 World Health Organization [facility]) criteria for post-menopausal, Caucasian Women: Normal       T-score at or above -1 SD Osteopenia   T-score between -1 and -2.5 SD Osteoporosis T-score at or below -2.5 SD RECOMMENDATION: 1. All patients should optimize calcium and vitamin D intake. 2. Consider FDA approved medical therapies in postmenopausal women and men aged 31 years and  older, based on the following: a. A hip or vertebral (clinical or morphometric) fracture b. T- score < or = -2.5 at the femoral neck or spine after appropriate evaluation  to exclude secondary causes c. Low bone mass (T-score between -1.0 and -2.5 at the femoral neck or spine) and a 10 year probability of a hip fracture > or = 3% or a 10 year probability of a major osteoporosis-related fracture > or = 20% based on the [place]-adapted WHO algorithm d. Clinician judgment and/or patient preferences may indicate treatment for people with 10-year fracture probabilities above or below these levels FOLLOW-UP: People with diagnosed cases of osteoporosis or at high risk for fracture should have regular bone mineral density tests. For patients eligible for Medicare, routine testing is allowed once every 2 years. The testing frequency can be increased to one year for patients who have rapidly progressing disease, those who are receiving or discontinuing medical therapy to restore bone mass, or have additional risk factors. I have reviewed this report and agree with the above findings. [facility] FRAX* 10-year Probability of Fracture Based on femoral neck BMD: DualFemur (Right) Major Osteoporotic Fracture: 9.9% Hip Fracture:                2.1% Population:                  [place] (Caucasian) Risk Factors:                None *FRAX is a Materials engineer of the [employer] for Metabolic Bone Disease, a [place] (WHO) [facility]. ASSESSMENT: The probability of a major osteoporotic fracture is 9.9 % within the next ten years. The probability of hip fracture is  2.1  % within the next 10 years. Electronically Signed   By: Marijo Conception, M.D.   On: 11/21/2017 15:19    Assessment/Plan 1. Chronic atrial fibrillation ([place]) Sent dig to local pharmacy International aid/development worker) and mail order b/c patient is completely out for several days and forgot to ask cardiology for a refill -  digoxin (LANOXIN) 0.125 MG tablet; Take 1 tablet (0.125 mg total) by mouth daily.  Dispense: 30 tablet; Refill: 0  2. Chronic bilateral low back pain without sciatica To use biofreeze on knees and lower back Continue tylenol Continue PT Continue exercise Await rheum consult  3. Rheumatoid arthritis involving hand with negative rheumatoid factor, unspecified laterality ([place]) Await rheum consult   4. Osteopenia of forearm, unspecified laterality -noted on bone density, cont vitamin D and weightbearing exercise  5. Vitamin B12 deficiency -cont current supplement  6. Mild cognitive impairment with memory loss -cont to monitor her for safety concerns--did notify hre daughter about the medication issue--may need med mgt or pillpack to keep her on track once we get her in with specialists and med list is pretty set  7. Bilateral primary osteoarthritis of knee To use biofreeze on knees and lower back Continue tylenol Continue PT Continue exercise  Next appt:  04/16/2018  Shaylie Eklund L. Mordche Hedglin, D.O. [facility] [address], [place] [address] Cell Phone (Mon-Fri 8am-5pm):  [phone] On Call:  [phone] & follow prompts after 5pm & weekends Office Phone:  [phone] Office Fax:  [phone]

## 2017-12-13 DIAGNOSIS — G3184 Mild cognitive impairment, so stated: Secondary | ICD-10-CM | POA: Diagnosis not present

## 2017-12-13 DIAGNOSIS — M0589 Other rheumatoid arthritis with rheumatoid factor of multiple sites: Secondary | ICD-10-CM | POA: Diagnosis not present

## 2017-12-13 DIAGNOSIS — H532 Diplopia: Secondary | ICD-10-CM | POA: Diagnosis not present

## 2017-12-13 DIAGNOSIS — M545 Low back pain: Secondary | ICD-10-CM | POA: Diagnosis not present

## 2017-12-13 DIAGNOSIS — R278 Other lack of coordination: Secondary | ICD-10-CM | POA: Diagnosis not present

## 2017-12-13 DIAGNOSIS — R2689 Other abnormalities of gait and mobility: Secondary | ICD-10-CM | POA: Diagnosis not present

## 2017-12-17 DIAGNOSIS — R278 Other lack of coordination: Secondary | ICD-10-CM | POA: Diagnosis not present

## 2017-12-17 DIAGNOSIS — R2689 Other abnormalities of gait and mobility: Secondary | ICD-10-CM | POA: Diagnosis not present

## 2017-12-17 DIAGNOSIS — H532 Diplopia: Secondary | ICD-10-CM | POA: Diagnosis not present

## 2017-12-17 DIAGNOSIS — M0589 Other rheumatoid arthritis with rheumatoid factor of multiple sites: Secondary | ICD-10-CM | POA: Diagnosis not present

## 2017-12-17 DIAGNOSIS — G3184 Mild cognitive impairment, so stated: Secondary | ICD-10-CM | POA: Diagnosis not present

## 2017-12-17 DIAGNOSIS — M545 Low back pain: Secondary | ICD-10-CM | POA: Diagnosis not present

## 2017-12-24 DIAGNOSIS — Z8673 Personal history of transient ischemic attack (TIA), and cerebral infarction without residual deficits: Secondary | ICD-10-CM | POA: Diagnosis not present

## 2017-12-24 DIAGNOSIS — G3184 Mild cognitive impairment, so stated: Secondary | ICD-10-CM | POA: Diagnosis not present

## 2017-12-24 DIAGNOSIS — H532 Diplopia: Secondary | ICD-10-CM | POA: Diagnosis not present

## 2017-12-24 DIAGNOSIS — M21061 Valgus deformity, not elsewhere classified, right knee: Secondary | ICD-10-CM | POA: Diagnosis not present

## 2017-12-24 DIAGNOSIS — R2689 Other abnormalities of gait and mobility: Secondary | ICD-10-CM | POA: Diagnosis not present

## 2017-12-24 DIAGNOSIS — M0589 Other rheumatoid arthritis with rheumatoid factor of multiple sites: Secondary | ICD-10-CM | POA: Diagnosis not present

## 2017-12-24 DIAGNOSIS — M545 Low back pain: Secondary | ICD-10-CM | POA: Diagnosis not present

## 2017-12-24 DIAGNOSIS — R278 Other lack of coordination: Secondary | ICD-10-CM | POA: Diagnosis not present

## 2017-12-24 DIAGNOSIS — M1711 Unilateral primary osteoarthritis, right knee: Secondary | ICD-10-CM | POA: Diagnosis not present

## 2017-12-30 DIAGNOSIS — Z6823 Body mass index (BMI) 23.0-23.9, adult: Secondary | ICD-10-CM | POA: Diagnosis not present

## 2017-12-30 DIAGNOSIS — M150 Primary generalized (osteo)arthritis: Secondary | ICD-10-CM | POA: Diagnosis not present

## 2017-12-30 DIAGNOSIS — M5136 Other intervertebral disc degeneration, lumbar region: Secondary | ICD-10-CM | POA: Diagnosis not present

## 2017-12-30 DIAGNOSIS — M0579 Rheumatoid arthritis with rheumatoid factor of multiple sites without organ or systems involvement: Secondary | ICD-10-CM | POA: Diagnosis not present

## 2018-01-17 DIAGNOSIS — Z23 Encounter for immunization: Secondary | ICD-10-CM | POA: Diagnosis not present

## 2018-01-28 ENCOUNTER — Non-Acute Institutional Stay: Payer: Medicare Other

## 2018-01-28 VITALS — BP 126/78 | HR 98 | Temp 98.0°F | Ht 62.0 in | Wt 127.0 lb

## 2018-01-28 DIAGNOSIS — Z0000 Encounter for general adult medical examination without abnormal findings: Secondary | ICD-10-CM

## 2018-01-28 NOTE — Patient Instructions (Addendum)
[name] , Thank you for taking time to come for your Medicare Wellness Visit. I appreciate your ongoing commitment to your health goals. Please review the following plan we discussed and let me know if I can assist you in the future.   Screening recommendations/referrals: Colonoscopy excluded, over age 82 Mammogram excluded, over age 82 Bone Density up to date Recommended yearly ophthalmology/optometry visit for glaucoma screening and checkup Recommended yearly dental visit for hygiene and checkup  Vaccinations: Influenza vaccine up to date Pneumococcal vaccine up to date, completed Tdap vaccine up to date, due 04/17/2027 Shingles vaccine up to date, completed    Advanced directives: in chart  Conditions/risks identified: none  Next appointment: Dr. Mariea Clonts 04/16/2018 @ 1:30pm   Preventive Care 18 Years and Older, Female Preventive care refers to lifestyle choices and visits with your health care provider that can promote health and wellness. What does preventive care include?  A yearly physical exam. This is also called an annual well check.  Dental exams once or twice a year.  Routine eye exams. Ask your health care provider how often you should have your eyes checked.  Personal lifestyle choices, including:  Daily care of your teeth and gums.  Regular physical activity.  Eating a healthy diet.  Avoiding tobacco and drug use.  Limiting alcohol use.  Practicing safe sex.  Taking low-dose aspirin every day.  Taking vitamin and mineral supplements as recommended by your health care provider. What happens during an annual well check? The services and screenings done by your health care provider during your annual well check will depend on your age, overall health, lifestyle risk factors, and family history of disease. Counseling  Your health care provider may ask you questions about your:  Alcohol use.  Tobacco use.  Drug use.  Emotional well-being.  Home and  relationship well-being.  Sexual activity.  Eating habits.  History of falls.  Memory and ability to understand (cognition).  Work and work Statistician.  Reproductive health. Screening  You may have the following tests or measurements:  Height, weight, and BMI.  Blood pressure.  Lipid and cholesterol levels. These may be checked every 5 years, or more frequently if you are over 52 years old.  Skin check.  Lung cancer screening. You may have this screening every year starting at age 47 if you have a 30-pack-year history of smoking and currently smoke or have quit within the past 15 years.  Fecal occult blood test (FOBT) of the stool. You may have this test every year starting at age 48.  Flexible sigmoidoscopy or colonoscopy. You may have a sigmoidoscopy every 5 years or a colonoscopy every 10 years starting at age 73.  Hepatitis C blood test.  Hepatitis B blood test.  Sexually transmitted disease (STD) testing.  Diabetes screening. This is done by checking your blood sugar (glucose) after you have not eaten for a while (fasting). You may have this done every 1-3 years.  Bone density scan. This is done to screen for osteoporosis. You may have this done starting at age 8.  Mammogram. This may be done every 1-2 years. Talk to your health care provider about how often you should have regular mammograms. Talk with your health care provider about your test results, treatment options, and if necessary, the need for more tests. Vaccines  Your health care provider may recommend certain vaccines, such as:  Influenza vaccine. This is recommended every year.  Tetanus, diphtheria, and acellular pertussis (Tdap, Td) vaccine. You  may need a Td booster every 10 years.  Zoster vaccine. You may need this after age 50.  Pneumococcal 13-valent conjugate (PCV13) vaccine. One dose is recommended after age 56.  Pneumococcal polysaccharide (PPSV23) vaccine. One dose is recommended after  age 33. Talk to your health care provider about which screenings and vaccines you need and how often you need them. This information is not intended to replace advice given to you by your health care provider. Make sure you discuss any questions you have with your health care provider. Document Released: 04/08/2015 Document Revised: 11/30/2015 Document Reviewed: 01/11/2015 Elsevier Interactive Patient Education  2017 [place] Prevention in the Home Falls can cause injuries. They can happen to people of all ages. There are many things you can do to make your home safe and to help prevent falls. What can I do on the outside of my home?  Regularly fix the edges of walkways and driveways and fix any cracks.  Remove anything that might make you trip as you walk through a door, such as a raised step or threshold.  Trim any bushes or trees on the path to your home.  Use bright outdoor lighting.  Clear any walking paths of anything that might make someone trip, such as rocks or tools.  Regularly check to see if handrails are loose or broken. Make sure that both sides of any steps have handrails.  Any raised decks and porches should have guardrails on the edges.  Have any leaves, snow, or ice cleared regularly.  Use sand or salt on walking paths during winter.  Clean up any spills in your garage right away. This includes oil or grease spills. What can I do in the bathroom?  Use night lights.  Install grab bars by the toilet and in the tub and shower. Do not use towel bars as grab bars.  Use non-skid mats or decals in the tub or shower.  If you need to sit down in the shower, use a plastic, non-slip stool.  Keep the floor dry. Clean up any water that spills on the floor as soon as it happens.  Remove soap buildup in the tub or shower regularly.  Attach bath mats securely with double-sided non-slip rug tape.  Do not have throw rugs and other things on the floor that can  make you trip. What can I do in the bedroom?  Use night lights.  Make sure that you have a light by your bed that is easy to reach.  Do not use any sheets or blankets that are too big for your bed. They should not hang down onto the floor.  Have a firm chair that has side arms. You can use this for support while you get dressed.  Do not have throw rugs and other things on the floor that can make you trip. What can I do in the kitchen?  Clean up any spills right away.  Avoid walking on wet floors.  Keep items that you use a lot in easy-to-reach places.  If you need to reach something above you, use a strong step stool that has a grab bar.  Keep electrical cords out of the way.  Do not use floor polish or wax that makes floors slippery. If you must use wax, use non-skid floor wax.  Do not have throw rugs and other things on the floor that can make you trip. What can I do with my stairs?  Do not leave any items  on the stairs.  Make sure that there are handrails on both sides of the stairs and use them. Fix handrails that are broken or loose. Make sure that handrails are as long as the stairways.  Check any carpeting to make sure that it is firmly attached to the stairs. Fix any carpet that is loose or worn.  Avoid having throw rugs at the top or bottom of the stairs. If you do have throw rugs, attach them to the floor with carpet tape.  Make sure that you have a light switch at the top of the stairs and the bottom of the stairs. If you do not have them, ask someone to add them for you. What else can I do to help prevent falls?  Wear shoes that:  Do not have high heels.  Have rubber bottoms.  Are comfortable and fit you well.  Are closed at the toe. Do not wear sandals.  If you use a stepladder:  Make sure that it is fully opened. Do not climb a closed stepladder.  Make sure that both sides of the stepladder are locked into place.  Ask someone to hold it for you,  if possible.  Clearly mark and make sure that you can see:  Any grab bars or handrails.  First and last steps.  Where the edge of each step is.  Use tools that help you move around (mobility aids) if they are needed. These include:  Canes.  Walkers.  Scooters.  Crutches.  Turn on the lights when you go into a dark area. Replace any light bulbs as soon as they burn out.  Set up your furniture so you have a clear path. Avoid moving your furniture around.  If any of your floors are uneven, fix them.  If there are any pets around you, be aware of where they are.  Review your medicines with your doctor. Some medicines can make you feel dizzy. This can increase your chance of falling. Ask your doctor what other things that you can do to help prevent falls. This information is not intended to replace advice given to you by your health care provider. Make sure you discuss any questions you have with your health care provider. Document Released: 01/06/2009 Document Revised: 08/18/2015 Document Reviewed: 04/16/2014 Elsevier Interactive Patient Education  2017 [employer].

## 2018-01-28 NOTE — Progress Notes (Signed)
Subjective:   Kathy Velez is a 84 y.o. female who presents for an Initial Medicare Annual Wellness Visit at [facility]    Objective:    Today's Vitals   01/28/18 1357  BP: 126/78  Pulse: 98  Temp: 98 F (36.7 C)  TempSrc: Oral  SpO2: 98%  Weight: 127 lb (57.6 kg)  Height: 5\' 2"  (1.575 m)   Body mass index is 23.23 kg/m.  Advanced Directives 01/28/2018 10/09/2017 05/02/2015 04/12/2015 04/11/2015  Does Patient Have a Medical Advance Directive? Yes Yes No Yes Yes  Type of Paramedic of [place];Living will Healthcare Power of [place];Living will  Does patient want to make changes to medical advance directive? No - Patient declined No - Patient declined - - -  Copy of [place] in Chart? Yes Yes Yes No - copy requested Yes    Current Medications (verified) Outpatient Encounter Medications as of 01/28/2018  Medication Sig  . Acetaminophen 500 MG coapsule Take 2 capsules by mouth 2 (two) times daily.   Marland Kitchen apixaban (ELIQUIS) 2.5 MG TABS tablet Take 1 tablet (2.5 mg total) by mouth 2 (two) times daily.  Marland Kitchen aspirin EC 81 MG tablet Take 81 mg by mouth daily.  Marland Kitchen atorvastatin (LIPITOR) 20 MG tablet Take 20 mg by mouth daily.  . Azelastine-Fluticasone 137-50 MCG/ACT SUSP Place into the nose daily as needed.   . Biotin 5000 MCG CAPS Take 1 capsule by mouth daily.  . cholecalciferol (VITAMIN D) 1000 units tablet Take 1,000 Units by mouth daily.  . digoxin (LANOXIN) 0.125 MG tablet Take 1 tablet (0.125 mg total) by mouth daily.  Marland Kitchen ipratropium (ATROVENT) 0.03 % nasal spray Place into the nose.  . levothyroxine (SYNTHROID, LEVOTHROID) 75 MCG tablet Take 75 mcg by mouth daily before breakfast. For Hypothyroidism  . loratadine (CLARITIN) 10 MG tablet Take 10 mg by mouth daily.  . magnesium oxide (MAG-OX) 400 MG tablet Take 400 mg by mouth daily.  . metoprolol succinate  (TOPROL-XL) 50 MG 24 hr tablet Take 50 mg by mouth daily.   . Multiple Vitamins-Minerals (PRESERVISION AREDS 2) CAPS Take 1 Dose by mouth 2 (two) times daily. For eye health  . polycarbophil (FIBERCON) 625 MG tablet Take 625 mg by mouth daily.  . valsartan (DIOVAN) 80 MG tablet Take 80 mg by mouth daily.  . vitamin B-12 (CYANOCOBALAMIN) 1000 MCG tablet Take 1,000 mcg by mouth daily.   . Omega-3 Fatty Acids (OMEGA-3 FISH OIL) 300 MG CAPS Take 1 capsule by mouth 2 (two) times daily.  . ranitidine (ZANTAC) 150 MG capsule Take 150 mg by mouth 2 (two) times daily.    No facility-administered encounter medications on file as of 01/28/2018.     Allergies (verified) Codeine; Molds & smuts; Pollen extract; and Bee venom   History: Past Medical History:  Diagnosis Date  . Anemia   . Anxiety   . Arthritis    Back  . Atrial fibrillation ([place])   . Cancer [facility]) 2006   Colon.  Basal Cell Skin cancer- right arm  . Colon cancer ([place])   . Constipation due to pain medication therapy    after heart surgery  . Dysrhythmia    PAF  . GERD (gastroesophageal reflux disease)   . Heart murmur   . Hypertension   . Hypothyroidism   . RA (rheumatoid arthritis) ([place])   . Restless leg   . Seizures ([place])  after Heart Surgey  . [facility]roke [facility]    TIA- found by neurologist after    Past Surgical History:  Procedure Laterality Date  . ABDOMINAL HYSTERECTOMY  1970   Partial   . COLON RESECTION  2006   cancer  . COLONOSCOPY    . EYE SURGERY Bilateral    Cataract  . MAXIMUM ACCESS (MAS)POSTERIOR LUMBAR INTERBODY FUSION (PLIF) 2 LEVEL N/A 04/12/2015   Procedure: Lumbar Three-Five Decompression, Pedicle Screw Fixation, and Posteriolateral Arthrodesis;  Surgeon: Erline Levine, MD;  Location: [facility];  Service: Neurosurgery;  Laterality: N/A;  L3-4 L4-5 Maximum access posterior lumbar fusion, possible interbodies and resection of synovial cyst at L4-5  . MITRAL VALVE REPAIR  01/20/13   Gore-tex cords to  P1, P2, and P3. Magic suture to posterior medial commisure, #30 Physio 1 ring. Done in [place]  . TONSILLECTOMY     about 1940  . TRICUSPID VALVE SURGERY  01/20/13   #28 TriAd ring done in [place]   Family History  Problem Relation Age of Onset  . Lung disease Mother 12  . Alcohol abuse Son   . Heart disease Father 57   Social History   Socioeconomic History  . Marital status: Widowed    Spouse name: Not on file  . Number of children: Not on file  . Years of education: Not on file  . Highest education level: Not on file  Occupational History  . 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 Financial resource strain: Not hard at all  . Food insecurity:    Worry: Never true    Inability: Never true  . Transportation needs:    Medical: No    Non-medical: No  Tobacco Use  . Smoking status: Never Smoker  . Smokeless tobacco: Never Used  Substance and Sexual Activity  . Alcohol use: Yes    Alcohol/week: 1.0 standard drinks    Types: 1 Glasses of wine per week  . Drug use: No  . Sexual activity: Not Currently  Lifestyle  . Physical activity:    Days per week: 6 days    Minutes per session: 30 min  . Stress: Only a little  Relationships  . Social connections:    Talks on phone: More than three times a week    Gets together: More than three times a week    Attends religious service: Never    Active member of club or organization: No    Attends meetings of clubs or organizations: Never    Relationship status: Widowed  Other Topics Concern  . Not on file  Social History Narrative   Social History      Diet? Healthy- low salt, sugar, fat      Do you drink/eat things with caffeine? On occasion      Marital status?        widow                        What year were you married? 1958      Do you live in a house, apartment, assisted living, condo, trailer, etc.? apartment      Is it one or more stories? one      How many persons live  in your home? one      Do you have any pets in your home? (please list) no      Highest level of education completed? 4 year  college      Current or past profession: Statistician      Do you exercise?             yes                         Type & how often? Classes- senior retirement community/ some walking      Advanced Directives      Do you have a living will?      Do you have a DNR form?                                  If not, do you want to discuss one?      Do you have signed POA/HPOA for forms?       Functional Status Completed by: Daughter, Di Kindle      Do you have difficulty bathing or dressing yourself? no      Do you have difficulty preparing food or eating? no      Do you have difficulty managing your medications? no      Do you have difficulty managing your finances? no      Do you have difficulty affording your medications? no    Tobacco Counseling Counseling given: Not Answered   Clinical Intake:  Pre-visit preparation completed: No  Pain : No/denies pain     Diabetes: No  How often do you need to have someone help you when you read instructions, pamphlets, or other written materials from your doctor or pharmacy?: 1 - Never What is the last grade level you completed in school?: college  Interpreter Needed?: No  Information entered by :: Tyson Dense, RN   Activities of Daily Living In your present state of health, do you have any difficulty performing the following activities: 01/28/2018  Hearing? N  Vision? N  Difficulty concentrating or making decisions? N  Walking or climbing stairs? N  Dressing or bathing? N  Doing errands, shopping? N  Preparing Food and eating ? N  Using the Toilet? N  In the past six months, have you accidently leaked urine? Y  Do you have problems with loss of bowel control? Y  Managing your Medications? N  Managing your Finances? N  Housekeeping or managing your Housekeeping? N  Some recent  data might be hidden     Immunizations and Health Maintenance Immunization History  Administered Date(s) Administered  . Influenza Whole 12/29/2008, 12/22/2009  . Influenza, High Dose Seasonal PF 01/15/2012, 12/23/2014  . Influenza,inj,Quad PF,6+ Mos 01/22/2014, 12/10/2016, 01/17/2018  . Influenza,trivalent, recombinat, inj, PF 01/05/2016  . Influenza-Unspecified 04/07/2008, 12/15/2014  . PPD Test 07/17/2010, 04/19/2015, 04/25/2015  . Pneumococcal Conjugate-13 11/10/2013  . Pneumococcal Polysaccharide-23 12/27/2010, 12/05/2016  . Tdap 04/15/2017  . Zoster Recombinat (Shingrix) 04/09/2017, 06/18/2017   There are no preventive care reminders to display for this patient.  Patient Care Team: Gayland Curry, DO as PCP - General (Geriatric Medicine)  Indicate any recent Medical Services you may have received from other than [facility] providers in the past year (date may be approximate).     Assessment:   This is a routine wellness examination for Kathy Velez.  Hearing/Vision screen Hearing Screening Comments: Bilateral hearing aids Vision Screening Comments: Sees eye doctor annually  Dietary issues and exercise activities discussed: Current Exercise Habits: Structured exercise class, Type of exercise: Other - see comments;walking;stretching(chair fit  class), Time (Minutes): 30, Frequency (Times/Week): 6, Weekly Exercise (Minutes/Week): 180, Intensity: Mild, Exercise limited by: None identified  Goals   None    Depression Screen PHQ 2/9 Scores 01/28/2018 12/11/2017 10/09/2017  PHQ - 2 Score 1 0 0    Fall Risk Fall Risk  01/28/2018 12/11/2017 10/09/2017  Falls in the past year? 0 No No  Number falls in past yr: 0 - -  Injury with Fall? 0 - -    Is the patient's home free of loose throw rugs in walkways, pet beds, electrical cords, etc?   yes      Grab bars in the bathroom? yes      Handrails on the stairs?   yes      Adequate lighting?   yes  Cognitive Function: MMSE - Mini Mental  State Exam 01/28/2018  Orientation to time 5  Orientation to Place 5  Registration 3  Attention/ Calculation 5  Recall 3  Language- name 2 objects 2  Language- repeat 1  Language- follow 3 step command 3  Language- read & follow direction 1  Write a sentence 1  Copy design 1  Total score 30        Screening Tests Health Maintenance  Topic Date Due  . TETANUS/TDAP  04/16/2027  . INFLUENZA VACCINE  Completed  . DEXA SCAN  Completed  . PNA vac Low Risk Adult  Completed    Qualifies for Shingles Vaccine? Up to date, completed  Cancer Screenings: Lung: Low Dose CT Chest recommended if Age 60-80 years, 30 pack-year currently smoking OR have quit w/in 15years. Patient does not qualify. Breast: Up to date on Mammogram? Yes   Up to date of Bone Density/Dexa? Yes Colorectal: up to date  Additional Screenings:  Hepatitis C Screening: declined      Plan:    I have personally reviewed and addressed the Medicare Annual Wellness questionnaire and have noted the following in the patient's chart:  A. Medical and social history B. Use of alcohol, tobacco or illicit drugs  C. Current medications and supplements D. Functional ability and status E.  Nutritional status F.  Physical activity G. Advance directives H. List of other physicians I.  Hospitalizations, surgeries, and ER visits in previous 12 months J.  [place] to include hearing, vision, cognitive, depression L. Referrals and appointments - none  In addition, I have reviewed and discussed with patient certain preventive protocols, quality metrics, and best practice recommendations. A written personalized care plan for preventive services as well as general preventive health recommendations were provided to patient.  See attached scanned questionnaire for additional information.   Signed,   Tyson Dense, RN Nurse Health Advisor  Patient concerns: Waking up a couple times a night and having problems  falling back asleep. Wondering if there is anything she could take that wouldn't be harmful and doesn't make her feel groggy the next day.  Also she doesn't feel like herself and is still having a little bit of phelgm from bronchiltis.

## 2018-02-03 DIAGNOSIS — M25571 Pain in right ankle and joints of right foot: Secondary | ICD-10-CM | POA: Diagnosis not present

## 2018-02-03 DIAGNOSIS — S8254XA Nondisplaced fracture of medial malleolus of right tibia, initial encounter for closed fracture: Secondary | ICD-10-CM | POA: Diagnosis not present

## 2018-02-03 DIAGNOSIS — M2041 Other hammer toe(s) (acquired), right foot: Secondary | ICD-10-CM | POA: Diagnosis not present

## 2018-02-03 DIAGNOSIS — M67961 Unspecified disorder of synovium and tendon, right lower leg: Secondary | ICD-10-CM | POA: Diagnosis not present

## 2018-02-03 DIAGNOSIS — M2042 Other hammer toe(s) (acquired), left foot: Secondary | ICD-10-CM | POA: Diagnosis not present

## 2018-02-03 DIAGNOSIS — M79672 Pain in left foot: Secondary | ICD-10-CM | POA: Diagnosis not present

## 2018-02-11 DIAGNOSIS — G3184 Mild cognitive impairment, so stated: Secondary | ICD-10-CM | POA: Diagnosis not present

## 2018-02-11 DIAGNOSIS — M62571 Muscle wasting and atrophy, not elsewhere classified, right ankle and foot: Secondary | ICD-10-CM | POA: Diagnosis not present

## 2018-02-11 DIAGNOSIS — Z8673 Personal history of transient ischemic attack (TIA), and cerebral infarction without residual deficits: Secondary | ICD-10-CM | POA: Diagnosis not present

## 2018-02-11 DIAGNOSIS — M79671 Pain in right foot: Secondary | ICD-10-CM | POA: Diagnosis not present

## 2018-02-11 DIAGNOSIS — M67871 Other specified disorders of synovium, right ankle and foot: Secondary | ICD-10-CM | POA: Diagnosis not present

## 2018-02-11 DIAGNOSIS — R2689 Other abnormalities of gait and mobility: Secondary | ICD-10-CM | POA: Diagnosis not present

## 2018-02-11 DIAGNOSIS — S8254XS Nondisplaced fracture of medial malleolus of right tibia, sequela: Secondary | ICD-10-CM | POA: Diagnosis not present

## 2018-02-11 DIAGNOSIS — M170 Bilateral primary osteoarthritis of knee: Secondary | ICD-10-CM | POA: Diagnosis not present

## 2018-02-12 DIAGNOSIS — M25562 Pain in left knee: Secondary | ICD-10-CM | POA: Diagnosis not present

## 2018-02-12 DIAGNOSIS — M25561 Pain in right knee: Secondary | ICD-10-CM | POA: Diagnosis not present

## 2018-02-12 DIAGNOSIS — M1711 Unilateral primary osteoarthritis, right knee: Secondary | ICD-10-CM | POA: Diagnosis not present

## 2018-02-12 DIAGNOSIS — M1712 Unilateral primary osteoarthritis, left knee: Secondary | ICD-10-CM | POA: Diagnosis not present

## 2018-02-14 DIAGNOSIS — M170 Bilateral primary osteoarthritis of knee: Secondary | ICD-10-CM | POA: Diagnosis not present

## 2018-02-14 DIAGNOSIS — M79671 Pain in right foot: Secondary | ICD-10-CM | POA: Diagnosis not present

## 2018-02-14 DIAGNOSIS — G3184 Mild cognitive impairment, so stated: Secondary | ICD-10-CM | POA: Diagnosis not present

## 2018-02-14 DIAGNOSIS — M62571 Muscle wasting and atrophy, not elsewhere classified, right ankle and foot: Secondary | ICD-10-CM | POA: Diagnosis not present

## 2018-02-14 DIAGNOSIS — R2689 Other abnormalities of gait and mobility: Secondary | ICD-10-CM | POA: Diagnosis not present

## 2018-02-14 DIAGNOSIS — M67871 Other specified disorders of synovium, right ankle and foot: Secondary | ICD-10-CM | POA: Diagnosis not present

## 2018-02-18 DIAGNOSIS — M62571 Muscle wasting and atrophy, not elsewhere classified, right ankle and foot: Secondary | ICD-10-CM | POA: Diagnosis not present

## 2018-02-18 DIAGNOSIS — M170 Bilateral primary osteoarthritis of knee: Secondary | ICD-10-CM | POA: Diagnosis not present

## 2018-02-18 DIAGNOSIS — M79671 Pain in right foot: Secondary | ICD-10-CM | POA: Diagnosis not present

## 2018-02-18 DIAGNOSIS — G3184 Mild cognitive impairment, so stated: Secondary | ICD-10-CM | POA: Diagnosis not present

## 2018-02-18 DIAGNOSIS — M67871 Other specified disorders of synovium, right ankle and foot: Secondary | ICD-10-CM | POA: Diagnosis not present

## 2018-02-18 DIAGNOSIS — R2689 Other abnormalities of gait and mobility: Secondary | ICD-10-CM | POA: Diagnosis not present

## 2018-02-19 ENCOUNTER — Emergency Department (HOSPITAL_COMMUNITY): Payer: Medicare Other

## 2018-02-19 ENCOUNTER — Encounter (HOSPITAL_COMMUNITY): Payer: Self-pay | Admitting: Emergency Medicine

## 2018-02-19 ENCOUNTER — Other Ambulatory Visit: Payer: Self-pay

## 2018-02-19 ENCOUNTER — Emergency Department (HOSPITAL_COMMUNITY)
Admission: EM | Admit: 2018-02-19 | Discharge: 2018-02-19 | Disposition: A | Discharge status: Home / Self Care | Payer: Medicare Other | Attending: Emergency Medicine | Admitting: Emergency Medicine

## 2018-02-19 DIAGNOSIS — Y999 Unspecified external cause status: Secondary | ICD-10-CM | POA: Diagnosis not present

## 2018-02-19 DIAGNOSIS — Y929 Unspecified place or not applicable: Secondary | ICD-10-CM | POA: Diagnosis not present

## 2018-02-19 DIAGNOSIS — S83422A Sprain of lateral collateral ligament of left knee, initial encounter: Secondary | ICD-10-CM | POA: Insufficient documentation

## 2018-02-19 DIAGNOSIS — Z7982 Long term (current) use of aspirin: Secondary | ICD-10-CM | POA: Diagnosis not present

## 2018-02-19 DIAGNOSIS — I10 Essential (primary) hypertension: Secondary | ICD-10-CM | POA: Diagnosis not present

## 2018-02-19 DIAGNOSIS — E039 Hypothyroidism, unspecified: Secondary | ICD-10-CM | POA: Insufficient documentation

## 2018-02-19 DIAGNOSIS — I4892 Unspecified atrial flutter: Secondary | ICD-10-CM | POA: Diagnosis not present

## 2018-02-19 DIAGNOSIS — Y9389 Activity, other specified: Secondary | ICD-10-CM | POA: Diagnosis not present

## 2018-02-19 DIAGNOSIS — Z79899 Other long term (current) drug therapy: Secondary | ICD-10-CM | POA: Insufficient documentation

## 2018-02-19 DIAGNOSIS — M069 Rheumatoid arthritis, unspecified: Secondary | ICD-10-CM | POA: Insufficient documentation

## 2018-02-19 DIAGNOSIS — X500XXA Overexertion from strenuous movement or load, initial encounter: Secondary | ICD-10-CM | POA: Insufficient documentation

## 2018-02-19 DIAGNOSIS — I4821 Permanent atrial fibrillation: Secondary | ICD-10-CM | POA: Diagnosis not present

## 2018-02-19 DIAGNOSIS — S8992XA Unspecified injury of left lower leg, initial encounter: Secondary | ICD-10-CM | POA: Diagnosis present

## 2018-02-19 DIAGNOSIS — M25562 Pain in left knee: Secondary | ICD-10-CM | POA: Diagnosis not present

## 2018-02-19 MED ORDER — HYDROCODONE-ACETAMINOPHEN 5-325 MG PO TABS
1.0000 | ORAL_TABLET | ORAL | Status: AC
  Administered 2018-02-19: 1 via ORAL
  Filled 2018-02-19: qty 1

## 2018-02-19 NOTE — Discharge Instructions (Addendum)
Take over-the-counter medications as needed for pain, apply ice to help with the pain and swelling, follow-up with an orthopedic doctor next week if the symptoms persist  Use the walker as we discussed to help keep off your knee

## 2018-02-19 NOTE — ED Triage Notes (Signed)
Pt states she was attempting to change her pants when her left knee popped. Patient states she has RA and is going through physical therapy. Patient states she feels like something tore.

## 2018-02-19 NOTE — ED Provider Notes (Signed)
[facility] DEPT Provider Note   CSN: [id number] Arrival date & time: 02/19/18  1946     History   Chief Complaint Chief Complaint  Patient presents with  . Knee Pain    HPI Kathy Velez is a 84 y.o. female.  HPI Pt was getting dressed and twisted her knee during the process.  She experienced severe pain and could not put any weight on it.  No falls to the ground.  No fevers.  No swelling.  Past Medical History:  Diagnosis Date  . Anemia   . Anxiety   . Arthritis    Back  . Atrial fibrillation ([place])   . Cancer [facility]) 2006   Colon.  Basal Cell Skin cancer- right arm  . Colon cancer ([place])   . Constipation due to pain medication therapy    after heart surgery  . Dysrhythmia    PAF  . GERD (gastroesophageal reflux disease)   . Heart murmur   . Hypertension   . Hypothyroidism   . RA (rheumatoid arthritis) ([place])   . Restless leg   . Seizures ([place])    after Heart Surgey  . Stroke [facility])    TIA- found by neurologist after     Patient Active Problem List   Diagnosis Date Noted  . Osteopenia of forearm 12/11/2017  . Bilateral primary osteoarthritis of knee 12/11/2017  . Permanent atrial fibrillation 11/22/2017  . Hypercoagulable state due to atrial fibrillation ([place]) 10/11/2017  . Chronic atrial fibrillation 10/11/2017  . Diplopia 10/11/2017  . Presbycusis of both ears 10/11/2017  . Acquired hammertoes of both feet 10/11/2017  . Macrocytosis without anemia 10/11/2017  . Genu valgum, right 10/11/2017  . History of multiple strokes 10/11/2017  . Mild cognitive impairment with memory loss 10/11/2017  . Overactive bladder 10/11/2017  . Mixed hyperlipidemia 09/03/2017  . History of colon cancer, stage III 01/17/2017  . Carotid stenosis 12/13/2016  . Chronic congestion of paranasal sinus 11/13/2016  . Chronic seasonal allergic rhinitis 06/06/2016  . Interstitial lung disease ([place]) 12/28/2015  . Hemispheric carotid artery syndrome  07/18/2015  . Hypothyroidism 05/05/2015  . Rheumatoid arthritis ([place]) 05/05/2015  . Constipation 05/05/2015  . Vitamin B12 deficiency 05/05/2015  . Iron deficiency anemia 05/02/2015  . Back pain 05/02/2015  . Elective surgery   . Atrial flutter ([place]) 04/13/2015  . Chest pain at rest 04/13/2015  . Status post mitral valve repair 04/13/2015  . Status post tricuspid valve repair 04/13/2015  . Spondylolisthesis of lumbar region 04/12/2015  . Spinal stenosis of lumbar region 02/02/2015  . RLS (restless legs syndrome) 11/10/2013  . HTN (hypertension), benign 07/14/2013  . Seizure ([place]) 01/21/2013  . Depression 07/02/2011    Past Surgical History:  Procedure Laterality Date  . ABDOMINAL HYSTERECTOMY  1970   Partial   . COLON RESECTION  2006   cancer  . COLONOSCOPY    . EYE SURGERY Bilateral    Cataract  . MAXIMUM ACCESS (MAS)POSTERIOR LUMBAR INTERBODY FUSION (PLIF) 2 LEVEL N/A 04/12/2015   Procedure: Lumbar Three-Five Decompression, Pedicle Screw Fixation, and Posteriolateral Arthrodesis;  Surgeon: Erline Levine, MD;  Location: [facility];  Service: Neurosurgery;  Laterality: N/A;  L3-4 L4-5 Maximum access posterior lumbar fusion, possible interbodies and resection of synovial cyst at L4-5  . MITRAL VALVE REPAIR  01/20/13   Gore-tex cords to P1, P2, and P3. Magic suture to posterior medial commisure, #30 Physio 1 ring. Done in [place]  . TONSILLECTOMY     about  [place] VALVE SURGERY  01/20/13   #28 TriAd ring done in [place]     OB History   None      Home Medications    Prior to Admission medications   Medication Sig Start Date End Date Taking? Authorizing Provider  acetaminophen (TYLENOL) 500 MG tablet Take 1,000 mg by mouth every 6 (six) hours as needed for moderate pain.   Yes [provider]  apixaban (ELIQUIS) 2.5 MG TABS tablet Take 1 tablet (2.5 mg total) by mouth 2 (two) times daily. 04/18/15  Yes Erline Levine, MD  aspirin EC 81 MG tablet Take  81 mg by mouth daily.   Yes [provider]  atorvastatin (LIPITOR) 20 MG tablet Take 20 mg by mouth daily.   Yes [provider]  digoxin (LANOXIN) 0.125 MG tablet Take 1 tablet (0.125 mg total) by mouth daily. 12/11/17  Yes Reed, Tiffany L, DO  levothyroxine (SYNTHROID, LEVOTHROID) 75 MCG tablet Take 75 mcg by mouth daily before breakfast. For Hypothyroidism   Yes [provider]  loratadine (CLARITIN) 10 MG tablet Take 10 mg by mouth daily.   Yes [provider]  magnesium oxide (MAG-OX) 400 MG tablet Take 400 mg by mouth daily.   Yes [provider]  metoprolol succinate (TOPROL-XL) 50 MG 24 hr tablet Take 50 mg by mouth daily.  05/09/17  Yes [provider]  Multiple Vitamins-Minerals (PRESERVISION AREDS 2) CAPS Take 1 Dose by mouth 2 (two) times daily. For eye health   Yes [provider]  Omega-3 Fatty Acids (OMEGA-3 FISH OIL) 300 MG CAPS Take 1 capsule by mouth 2 (two) times daily.   Yes [provider]  polycarbophil (FIBERCON) 625 MG tablet Take 625 mg by mouth daily.   Yes [provider]  ranitidine (ZANTAC) 150 MG capsule Take 150 mg by mouth 2 (two) times daily.    Yes [provider]  valsartan (DIOVAN) 80 MG tablet Take 80 mg by mouth daily.   Yes [provider]  vitamin B-12 (CYANOCOBALAMIN) 1000 MCG tablet Take 1,000 mcg by mouth daily.    Yes [provider]  Azelastine-Fluticasone 137-50 MCG/ACT SUSP Place 1 spray into the nose daily as needed (allergies).  01/09/17   [provider]  Biotin 5000 MCG CAPS Take 1 capsule by mouth daily.    [provider]  cholecalciferol (VITAMIN D) 1000 units tablet Take 1,000 Units by mouth daily.    [provider]    Family History Family History  Problem Relation Age of Onset  . Lung disease Mother 32  . Alcohol abuse Son   . Heart disease Father 93    Social History Social History   Tobacco Use    . Smoking status: Never Smoker  . Smokeless tobacco: Never Used  Substance Use Topics  . Alcohol use: Yes    Alcohol/week: 1.0 standard drinks    Types: 1 Glasses of wine per week  . Drug use: No     Allergies   Codeine; Molds & smuts; Pollen extract; and Bee venom   Review of Systems Review of Systems  All other systems reviewed and are negative.    Physical Exam Updated Vital Signs BP (!) 151/94 (BP Location: Left Arm)   Pulse 79   Temp 98 F (36.7 C) (Oral)   Resp 16   Ht 1.575 m (5\' 2" )   Wt 54.9 kg   SpO2 98%   BMI 22.13 kg/m  Physical Exam  Constitutional: She appears well-developed and well-nourished. No distress.  HENT:  Head: Normocephalic and atraumatic.  Right Ear: External ear normal.  Left Ear: External ear normal.  Eyes: Conjunctivae are normal. Right eye exhibits no discharge. Left eye exhibits no discharge. No scleral icterus.  Neck: Neck supple. No tracheal deviation present.  Cardiovascular: Normal rate.  Pulmonary/Chest: Effort normal. No stridor. No respiratory distress.  Abdominal: She exhibits no distension.  Musculoskeletal: She exhibits no edema.       Left knee: She exhibits no swelling, no effusion and no laceration. Tenderness found. Lateral joint line tenderness noted.  Able to fully range the knee, the patient was able to extend her knee completely  Neurological: She is alert. Cranial nerve deficit: no gross deficits.  Skin: Skin is warm and dry. No rash noted.  Psychiatric: She has a normal mood and affect.  Nursing note and vitals reviewed.    ED Treatments / Results  Labs (all labs ordered are listed, but only abnormal results are displayed) Labs Reviewed - No data to display  EKG None  Radiology Dg Knee Complete 4 Views Left  Result Date: 02/19/2018 CLINICAL DATA:  LEFT knee popped while attempting to change pants, history rheumatoid arthritis EXAM: LEFT KNEE - COMPLETE 4+ VIEW COMPARISON:  None FINDINGS: Osseous  demineralization. Mild diffuse joint space narrowing. No acute fracture, dislocation, or bone destruction. Scattered clothing artifacts. No knee joint effusion or definite erosive changes. IMPRESSION: No acute osseous abnormalities. Electronically Signed   By: Lavonia Dana M.D.   On: 02/19/2018 20:05    Procedures Procedures (including critical care time)  Medications Ordered in ED Medications  HYDROcodone-acetaminophen (NORCO/VICODIN) 5-325 MG per tablet 1 tablet (1 tablet Oral Given 02/19/18 2039)     Initial Impression / Assessment and Plan / ED Course  I have reviewed the triage vital signs and the nursing notes.  Pertinent labs & imaging results that were available during my care of the patient were reviewed by me and considered in my medical decision making (see chart for details).   Patient presented to the emergency room for evaluation of knee pain.  X-rays do not show any acute abnormalities.  Patient symptoms are suggestive of possible ligamentous injury versus meniscal injury.  No signs of infection dislocation or fracture.  Will treat with an Ace wrap/knee sleeve.  We also discussed the possibility of a knee immobilizer.  Patient will use a walker at home.  Follow-up with orthopedic doctor.  Final Clinical Impressions(s) / ED Diagnoses   Final diagnoses:  Sprain of lateral collateral ligament of left knee, initial encounter    ED Discharge Orders    None       Dorie Rank, MD 02/19/18 2048

## 2018-02-24 DIAGNOSIS — S8254XS Nondisplaced fracture of medial malleolus of right tibia, sequela: Secondary | ICD-10-CM | POA: Diagnosis not present

## 2018-02-24 DIAGNOSIS — G3184 Mild cognitive impairment, so stated: Secondary | ICD-10-CM | POA: Diagnosis not present

## 2018-02-24 DIAGNOSIS — M170 Bilateral primary osteoarthritis of knee: Secondary | ICD-10-CM | POA: Diagnosis not present

## 2018-02-24 DIAGNOSIS — M67871 Other specified disorders of synovium, right ankle and foot: Secondary | ICD-10-CM | POA: Diagnosis not present

## 2018-02-24 DIAGNOSIS — M62571 Muscle wasting and atrophy, not elsewhere classified, right ankle and foot: Secondary | ICD-10-CM | POA: Diagnosis not present

## 2018-02-24 DIAGNOSIS — Z8673 Personal history of transient ischemic attack (TIA), and cerebral infarction without residual deficits: Secondary | ICD-10-CM | POA: Diagnosis not present

## 2018-02-24 DIAGNOSIS — M79671 Pain in right foot: Secondary | ICD-10-CM | POA: Diagnosis not present

## 2018-02-24 DIAGNOSIS — R2689 Other abnormalities of gait and mobility: Secondary | ICD-10-CM | POA: Diagnosis not present

## 2018-03-01 DIAGNOSIS — M25562 Pain in left knee: Secondary | ICD-10-CM | POA: Diagnosis not present

## 2018-03-01 DIAGNOSIS — M25561 Pain in right knee: Secondary | ICD-10-CM | POA: Diagnosis not present

## 2018-03-05 DIAGNOSIS — M25562 Pain in left knee: Secondary | ICD-10-CM | POA: Diagnosis not present

## 2018-04-02 DIAGNOSIS — M5136 Other intervertebral disc degeneration, lumbar region: Secondary | ICD-10-CM | POA: Diagnosis not present

## 2018-04-02 DIAGNOSIS — M0579 Rheumatoid arthritis with rheumatoid factor of multiple sites without organ or systems involvement: Secondary | ICD-10-CM | POA: Diagnosis not present

## 2018-04-02 DIAGNOSIS — M150 Primary generalized (osteo)arthritis: Secondary | ICD-10-CM | POA: Diagnosis not present

## 2018-04-02 DIAGNOSIS — Z6823 Body mass index (BMI) 23.0-23.9, adult: Secondary | ICD-10-CM | POA: Diagnosis not present

## 2018-04-02 DIAGNOSIS — M7989 Other specified soft tissue disorders: Secondary | ICD-10-CM | POA: Diagnosis not present

## 2018-04-09 DIAGNOSIS — M545 Low back pain: Secondary | ICD-10-CM | POA: Diagnosis not present

## 2018-04-09 DIAGNOSIS — I10 Essential (primary) hypertension: Secondary | ICD-10-CM | POA: Diagnosis not present

## 2018-04-09 DIAGNOSIS — M48061 Spinal stenosis, lumbar region without neurogenic claudication: Secondary | ICD-10-CM | POA: Diagnosis not present

## 2018-04-09 DIAGNOSIS — M7130 Other bursal cyst, unspecified site: Secondary | ICD-10-CM | POA: Diagnosis not present

## 2018-04-09 DIAGNOSIS — M4316 Spondylolisthesis, lumbar region: Secondary | ICD-10-CM | POA: Diagnosis not present

## 2018-04-09 DIAGNOSIS — M5416 Radiculopathy, lumbar region: Secondary | ICD-10-CM | POA: Diagnosis not present

## 2018-04-16 ENCOUNTER — Encounter: Payer: Self-pay | Admitting: Internal Medicine

## 2018-04-16 ENCOUNTER — Non-Acute Institutional Stay: Payer: Medicare Other | Admitting: Internal Medicine

## 2018-04-16 VITALS — BP 128/68 | HR 94 | Temp 98.4°F | Ht 62.0 in | Wt 131.0 lb

## 2018-04-16 DIAGNOSIS — K219 Gastro-esophageal reflux disease without esophagitis: Secondary | ICD-10-CM

## 2018-04-16 DIAGNOSIS — J849 Interstitial pulmonary disease, unspecified: Secondary | ICD-10-CM

## 2018-04-16 DIAGNOSIS — M06049 Rheumatoid arthritis without rheumatoid factor, unspecified hand: Secondary | ICD-10-CM | POA: Diagnosis not present

## 2018-04-16 DIAGNOSIS — I482 Chronic atrial fibrillation, unspecified: Secondary | ICD-10-CM

## 2018-04-16 DIAGNOSIS — D6869 Other thrombophilia: Secondary | ICD-10-CM

## 2018-04-16 DIAGNOSIS — I4891 Unspecified atrial fibrillation: Secondary | ICD-10-CM | POA: Diagnosis not present

## 2018-04-16 MED ORDER — OMEPRAZOLE 20 MG PO CPDR: 20.0000 mg | DELAYED_RELEASE_CAPSULE | Freq: Every day | ORAL | 3 refills | Status: DC

## 2018-04-16 MED ORDER — CETIRIZINE HCL 10 MG PO TABS: 10.0000 mg | ORAL_TABLET | Freq: Every day | ORAL | 3 refills | Status: DC

## 2018-04-16 NOTE — Progress Notes (Signed)
Location:  [facility] ([facility]) clinic Provider:  Gearl Kimbrough L. Mariea Velez, D.O., C.M.D.  Goals of Care:  Advanced Directives 02/19/2018  Does Patient Have a Medical Advance Directive? No  Type of Advance Directive -  Does patient want to make changes to medical advance directive? -  Copy of Kathy Velez in Chart? -     Chief Complaint  Patient presents with  . Medical Management of Chronic Issues    58mth follow-up    HPI: Patient is a 85 y.o. female seen today for medical management of chronic diseases.  Her daughter is out of town so she comes alone.  She seems a bit nervous.  She admits she does not remember as well.  Thanksgiving she turned her knee out and she had to wear a brace for a while.    She has not been feeling all that great.  It's hard to know what the cause is.  She has her RA and she has pain in her joints.  She saw Dr. Vertell Limber last week to check an MRI for her back.  When she gets up in the morning her back is killing her.  She feels like it's more muscular, not like her pain before her back surgery.  [name] using tylenol.  Getting up and down even off the toilet is painful.    She has begun to exercise again.  She is limited on some things.  Kathy Velez does an excellent job.    [name] trying to eat a healthy diet.  She has gained weight since she's been here and she says it's all in her abdomen.    She sneezes a lot.  She had the bronchitis when she first moved here.  She feels like it never totally cleared.  She remains hoarse and she clears her throat a lot.  The sneezes are dramatic.  Already takes claritin.  She has a bottle of zyrtec and has not taken it.  Uses her nasal spray also bid.    BP is well controlled.    No longer has seizures--was one time.  Took meds for a year then it was stopped.  Does not feel her afib except to check her pulse.    Using omeprazole since she ran out of zantac.  Has had difficulty with her reflux lately.  Past Medical History:    Diagnosis Date  . Anemia   . Anxiety   . Arthritis    Back  . Atrial fibrillation ([place])   . Cancer [facility]) 2006   Colon.  Basal Cell Skin cancer- right arm  . Colon cancer ([place])   . Constipation due to pain medication therapy    after heart surgery  . Dysrhythmia    PAF  . GERD (gastroesophageal reflux disease)   . Heart murmur   . Hypertension   . Hypothyroidism   . RA (rheumatoid arthritis) ([place])   . Restless leg   . Seizures ([place])    after Heart Surgey  . Stroke [facility])    TIA- found by neurologist after     Past Surgical History:  Procedure Laterality Date  . ABDOMINAL HYSTERECTOMY  1970   Partial   . COLON RESECTION  2006   cancer  . COLONOSCOPY    . EYE SURGERY Bilateral    Cataract  . MAXIMUM ACCESS (MAS)POSTERIOR LUMBAR INTERBODY FUSION (PLIF) 2 LEVEL N/A 04/12/2015   Procedure: Lumbar Three-Five Decompression, Pedicle Screw Fixation, and Posteriolateral Arthrodesis;  Surgeon: Erline Levine, MD;  Location:  [place] [facility];  Service: Neurosurgery;  Laterality: N/A;  L3-4 L4-5 Maximum access posterior lumbar fusion, possible interbodies and resection of synovial cyst at L4-5  . MITRAL VALVE REPAIR  01/20/13   Gore-tex cords to P1, P2, and P3. Magic suture to posterior medial commisure, #30 Physio 1 ring. Done in [place]  . TONSILLECTOMY     about 1940  . TRICUSPID VALVE SURGERY  01/20/13   #28 TriAd ring done in [place]    Allergies  Allergen Reactions  . Codeine Other (See Comments)    "just don't take it well"  . Molds & Smuts   . Pollen Extract Swelling  . Bee Venom Swelling and Rash    Outpatient Encounter Medications as of 04/16/2018  Medication Sig  . acetaminophen (TYLENOL) 500 MG tablet Take 1,000 mg by mouth every 6 (six) hours as needed for moderate pain.  Marland Kitchen apixaban (ELIQUIS) 2.5 MG TABS tablet Take 1 tablet (2.5 mg total) by mouth 2 (two) times daily.  Marland Kitchen aspirin EC 81 MG tablet Take 81 mg by mouth daily.  Marland Kitchen atorvastatin (LIPITOR) 20 MG tablet Take  20 mg by mouth daily.  . Azelastine-Fluticasone 137-50 MCG/ACT SUSP Place 1 spray into the nose daily as needed (allergies).   . Biotin 5000 MCG CAPS Take 1 capsule by mouth daily.  . cholecalciferol (VITAMIN D) 1000 units tablet Take 1,000 Units by mouth daily.  . digoxin (LANOXIN) 0.125 MG tablet Take 1 tablet (0.125 mg total) by mouth daily.  Kathy Velez 50 MG/ML injection   . levothyroxine (SYNTHROID, LEVOTHROID) 75 MCG tablet Take 75 mcg by mouth daily before breakfast. For Hypothyroidism  . loratadine (CLARITIN) 10 MG tablet Take 10 mg by mouth daily.  . magnesium oxide (MAG-OX) 400 MG tablet Take 400 mg by mouth daily.  . metoprolol succinate (TOPROL-XL) 50 MG 24 hr tablet Take 50 mg by mouth daily.   . Multiple Vitamins-Minerals (PRESERVISION AREDS 2) CAPS Take 1 Dose by mouth 2 (two) times daily. For eye health  . Omega-3 Fatty Acids (OMEGA-3 FISH OIL) 300 MG CAPS Take 1 capsule by mouth 2 (two) times daily.  . polycarbophil (FIBERCON) 625 MG tablet Take 625 mg by mouth daily.  . ranitidine (ZANTAC) 150 MG capsule Take 150 mg by mouth 2 (two) times daily.   . valsartan (DIOVAN) 80 MG tablet Take 80 mg by mouth daily.  . vitamin B-12 (CYANOCOBALAMIN) 1000 MCG tablet Take 1,000 mcg by mouth daily.    No facility-administered encounter medications on file as of 04/16/2018.     Review of Systems:  Review of Systems  Constitutional: Positive for malaise/fatigue. Negative for chills and fever.       Wt gain  HENT: Negative for ear pain.   Eyes: Negative for blurred vision.       Early macular  Respiratory: Positive for cough and sputum production. Negative for shortness of breath and wheezing.        Clear sputum ; reports never resolved after bronchitis in summer  Cardiovascular: Negative for chest pain, palpitations and leg swelling.  Gastrointestinal: Positive for heartburn. Negative for abdominal pain, blood in stool, constipation, diarrhea and melena.  Genitourinary:  Negative for dysuria.  Musculoskeletal: Positive for back pain and joint pain. Negative for falls, myalgias and neck pain.       Right knee deviated medially  Skin: Negative for rash.  Neurological: Positive for dizziness. Negative for loss of consciousness.       Dizzy on standing (  educated on hydration to prevent orthostasis)  Endo/Heme/Allergies: Bruises/bleeds easily.  Psychiatric/Behavioral: Positive for memory loss. Negative for depression. The patient has insomnia. The patient is not nervous/anxious.     Health Maintenance  Topic Date Due  . TETANUS/TDAP  04/16/2027  . INFLUENZA VACCINE  Completed  . DEXA SCAN  Completed  . PNA vac Low Risk Adult  Completed    Physical Exam: Vitals:   04/16/18 1313  BP: 128/68  Pulse: 94  Temp: 98.4 F (36.9 C)  TempSrc: Oral  SpO2: 99%  Weight: 131 lb (59.4 kg)  Height: 5\' 2"  (1.575 m)   Body mass index is 23.96 kg/m. Physical Exam Vitals signs reviewed.  Constitutional:      Appearance: Normal appearance.  HENT:     Head: Normocephalic and atraumatic.  Neck:     Musculoskeletal: Neck supple.  Cardiovascular:     Comments: irreg irreg Pulmonary:     Effort: Pulmonary effort is normal.     Breath sounds: Rhonchi present. No wheezing.  Abdominal:     General: Bowel sounds are normal.     Palpations: Abdomen is soft.  Musculoskeletal: Normal range of motion.        General: Deformity present.     Comments: Right knee medially deviated; flat right foot; dizzy on standing; ulnar deviation of fingers, right hand worse than left  Skin:    General: Skin is warm and dry.  Neurological:     General: No focal deficit present.     Mental Status: She is alert and oriented to person, place, and time.     Comments: Some difficulty finding words and getting her stories out at times  Psychiatric:        Mood and Affect: Mood normal.        Behavior: Behavior normal.     Labs reviewed: Basic Metabolic Panel: Recent Labs     09/03/17  NA 139  K 4.9  BUN 20  CREATININE 0.8   Liver Function Tests: No results for input(s): AST, ALT, ALKPHOS, BILITOT, PROT, ALBUMIN in the last 8760 hours. No results for input(s): LIPASE, AMYLASE in the last 8760 hours. No results for input(s): AMMONIA in the last 8760 hours. CBC: Recent Labs    09/03/17  WBC 6.1  HGB 11.9*  HCT 35*  PLT 158   Lipid Panel: Recent Labs    09/03/17  CHOL 132  HDL 75*  LDLCALC 49  TRIG 55   Lab Results  Component Value Date   HGBA1C 5.5 01/12/2013    Assessment/Plan 1. Rheumatoid arthritis involving hand with negative rheumatoid factor, unspecified laterality ([place]) -continues on enbrel with f/u with Dr. Trudie Lerline Valdivia; she is not sure how much it's helped  2. Interstitial lung disease ([place]) -per records, may be cause of ongoing cough though it seems more like allergies and GERD -change claritin to zyrtec; cont omeprazole in place of zantac  3. Hypercoagulable state due to atrial fibrillation ([place]) -cont eliquis therapy  4. Chronic atrial fibrillation -rate controlled with diltiazem, dig, and anticoagulated with eliquis  5. Gastroesophageal reflux disease, esophagitis presence not specified - omeprazole (PRILOSEC) 20 MG capsule; Take 1 capsule (20 mg total) by mouth daily.  Dispense: 30 capsule; Refill: 3  Labs/tests ordered:  No new Next appt:  08/20/2018  Bjorn Hallas L. Siri Buege, D.O. [facility] [address], [place] [address] Cell Phone (Mon-Fri 8am-5pm):  [phone] On Call:  [phone] & follow prompts after 5pm &  weekends Office Phone:  [phone] Office Fax:  [phone]

## 2018-04-16 NOTE — Patient Instructions (Addendum)
Stop claritin.Try using zyrtec instead--one tablet daily.  See if this doesn't help your hoarseness and sneezing.   Continue using omeprazole for your reflux.  It should also help your hoarseness.    I will be on the lookout for your back MRI results.    Keep up the good work with your exercise program.

## 2018-04-21 DIAGNOSIS — I10 Essential (primary) hypertension: Secondary | ICD-10-CM | POA: Diagnosis not present

## 2018-04-23 DIAGNOSIS — M4316 Spondylolisthesis, lumbar region: Secondary | ICD-10-CM | POA: Diagnosis not present

## 2018-04-23 DIAGNOSIS — M48061 Spinal stenosis, lumbar region without neurogenic claudication: Secondary | ICD-10-CM | POA: Diagnosis not present

## 2018-04-23 DIAGNOSIS — M5126 Other intervertebral disc displacement, lumbar region: Secondary | ICD-10-CM | POA: Diagnosis not present

## 2018-04-30 DIAGNOSIS — H40013 Open angle with borderline findings, low risk, bilateral: Secondary | ICD-10-CM | POA: Diagnosis not present

## 2018-05-07 ENCOUNTER — Encounter: Payer: Self-pay | Admitting: Internal Medicine

## 2018-05-07 ENCOUNTER — Non-Acute Institutional Stay: Payer: Medicare Other | Admitting: Internal Medicine

## 2018-05-07 VITALS — BP 130/80 | HR 75 | Temp 98.4°F | Ht 62.0 in | Wt 128.0 lb

## 2018-05-07 DIAGNOSIS — Z5181 Encounter for therapeutic drug level monitoring: Secondary | ICD-10-CM

## 2018-05-07 DIAGNOSIS — N3281 Overactive bladder: Secondary | ICD-10-CM | POA: Diagnosis not present

## 2018-05-07 DIAGNOSIS — G3184 Mild cognitive impairment, so stated: Secondary | ICD-10-CM

## 2018-05-07 DIAGNOSIS — M06049 Rheumatoid arthritis without rheumatoid factor, unspecified hand: Secondary | ICD-10-CM

## 2018-05-07 DIAGNOSIS — F5101 Primary insomnia: Secondary | ICD-10-CM | POA: Diagnosis not present

## 2018-05-07 MED ORDER — MIRTAZAPINE 7.5 MG PO TABS: 7.5000 mg | ORAL_TABLET | Freq: Every day | ORAL | 3 refills | Status: DC

## 2018-05-07 NOTE — Progress Notes (Signed)
Location:  Occupational psychologist of Service:  Clinic (12)  Provider: Hanford Lust L. Mariea Clonts, D.O., C.M.D.  Goals of Care:  Advanced Directives 02/19/2018  Does Patient Have a Medical Advance Directive? No  Type of Advance Directive -  Does patient want to make changes to medical advance directive? -  Copy of [place] in Chart? -     No chief complaint on file.   HPI: Patient is a 85 y.o. female seen today for acute visit for malaise and fatigue. Has been doing exercises, but back limits her.  She just feels tired.  When she stands, it takes a minute and she feels a little loopy.  BP   If goes w/o prilosec, she has indigestion.  Will get regurgitation.    Was put on magnesium before moving here.  [name] not sure why.    Still sneezing several times a time.  [name] not sure why this happens.  Is taking zyrtec.  This is not new per her daughter.  Uses the nasal spray also, but not consistently--does as needed.    She is not eating a lot of fatty food.  We talked about lipitor time to effect.    Has used a bit more tylenol lately first thing when she first wakes up.  May take again later in the day.    She still has leg cramps in the bed.  Her daughter thinks her magnesium was started for that.    She really does not sleep well per [place].  She is either awake half the night or gets up multiple times to go to the bathroom and her mind gets going.    She has taken melatonin 5mg  regularly lately.  It's not helping.  She even took a second 5mg  when she got up at 2-3am.  She also tried the chamomile tea around 7pm. Goes to bed at 10pm.  Will wake up at 4am, reads, sometimes goes back to sleep.  Usually wakes up at 6am, sometimes goes back to sleep again until 7:30-8am.  She does worry some.  Denies waking with pain most of the time.  It's typically urinating or just popping awake.  Has tried to stop drinking after 7:30-8 and goes to bed at 10pm. She also  feels down a little bit.  She took remeron before and took it for a couple of years after her husband passed away.    Past Medical History:  Diagnosis Date  . Anemia   . Anxiety   . Arthritis    Back  . Atrial fibrillation ([place])   . Cancer [facility]) 2006   Colon.  Basal Cell Skin cancer- right arm  . Colon cancer ([place])   . Constipation due to pain medication therapy    after heart surgery  . Dysrhythmia    PAF  . GERD (gastroesophageal reflux disease)   . Heart murmur   . Hypertension   . Hypothyroidism   . RA (rheumatoid arthritis) ([place])   . Restless leg   . Seizures ([place])    after Heart Surgey  . Stroke [facility])    TIA- found by neurologist after     Past Surgical History:  Procedure Laterality Date  . ABDOMINAL HYSTERECTOMY  1970   Partial   . COLON RESECTION  2006   cancer  . COLONOSCOPY    . EYE SURGERY Bilateral    Cataract  . MAXIMUM ACCESS (MAS)POSTERIOR LUMBAR INTERBODY FUSION (PLIF) 2 LEVEL N/A  04/12/2015   Procedure: Lumbar Three-Five Decompression, Pedicle Screw Fixation, and Posteriolateral Arthrodesis;  Surgeon: Erline Levine, MD;  Location: [place] [facility];  Service: Neurosurgery;  Laterality: N/A;  L3-4 L4-5 Maximum access posterior lumbar fusion, possible interbodies and resection of synovial cyst at L4-5  . MITRAL VALVE REPAIR  01/20/13   Gore-tex cords to P1, P2, and P3. Magic suture to posterior medial commisure, #30 Physio 1 ring. Done in [place]  . TONSILLECTOMY     about 1940  . TRICUSPID VALVE SURGERY  01/20/13   #28 TriAd ring done in [place]    Allergies  Allergen Reactions  . Codeine Other (See Comments)    "just don't take it well"  . Molds & Smuts   . Pollen Extract Swelling  . Bee Venom Swelling and Rash    Outpatient Encounter Medications as of 05/07/2018  Medication Sig  . acetaminophen (TYLENOL) 500 MG tablet Take 1,000 mg by mouth every 6 (six) hours as needed for moderate pain.  Marland Kitchen apixaban (ELIQUIS) 2.5 MG TABS tablet Take 1 tablet (2.5  mg total) by mouth 2 (two) times daily.  Marland Kitchen aspirin EC 81 MG tablet Take 81 mg by mouth daily.  Marland Kitchen atorvastatin (LIPITOR) 20 MG tablet Take 20 mg by mouth daily.  . Azelastine-Fluticasone 137-50 MCG/ACT SUSP Place 1 spray into the nose daily as needed (allergies).   . Biotin 5000 MCG CAPS Take 1 capsule by mouth daily.  . cetirizine (ZYRTEC) 10 MG tablet Take 1 tablet (10 mg total) by mouth daily.  . cholecalciferol (VITAMIN D) 1000 units tablet Take 1,000 Units by mouth daily.  . digoxin (LANOXIN) 0.125 MG tablet Take 1 tablet (0.125 mg total) by mouth daily.  [name] SURECLICK 50 MG/ML injection   . levothyroxine (SYNTHROID, LEVOTHROID) 75 MCG tablet Take 75 mcg by mouth daily before breakfast. For Hypothyroidism  . magnesium oxide (MAG-OX) 400 MG tablet Take 400 mg by mouth daily.  . metoprolol succinate (TOPROL-XL) 50 MG 24 hr tablet Take 50 mg by mouth daily.   . Multiple Vitamins-Minerals (PRESERVISION AREDS 2) CAPS Take 1 Dose by mouth 2 (two) times daily. For eye health  . omeprazole (PRILOSEC) 20 MG capsule Take 1 capsule (20 mg total) by mouth daily.  . polycarbophil (FIBERCON) 625 MG tablet Take 625 mg by mouth daily.  . valsartan (DIOVAN) 80 MG tablet Take 80 mg by mouth daily.  . vitamin B-12 (CYANOCOBALAMIN) 1000 MCG tablet Take 1,000 mcg by mouth daily.    No facility-administered encounter medications on file as of 05/07/2018.     Review of Systems:  Review of Systems  Constitutional: Positive for malaise/fatigue. Negative for chills and fever.  HENT: Positive for hearing loss.   Eyes: Negative for blurred vision.  Respiratory: Negative for cough and shortness of breath.   Cardiovascular: Positive for leg swelling. Negative for chest pain and palpitations.  Gastrointestinal: Positive for constipation and heartburn. Negative for abdominal pain, blood in stool, diarrhea and melena.  Genitourinary: Positive for frequency and urgency. Negative for dysuria, flank pain and  hematuria.  Musculoskeletal: Positive for joint pain. Negative for falls.  Skin: Negative for itching and rash.  Neurological: Negative for dizziness and loss of consciousness.  Endo/Heme/Allergies: Bruises/bleeds easily.  Psychiatric/Behavioral: Positive for memory loss. Negative for depression. The patient is nervous/anxious and has insomnia.     Health Maintenance  Topic Date Due  . TETANUS/TDAP  04/16/2027  . INFLUENZA VACCINE  Completed  . DEXA SCAN  Completed  .  PNA vac Low Risk Adult  Completed    Physical Exam: There were no vitals filed for this visit. There is no height or weight on file to calculate BMI. Physical Exam Vitals signs reviewed.  Constitutional:      Appearance: Normal appearance.  HENT:     Head: Normocephalic and atraumatic.  Cardiovascular:     Comments: irreg irreg Pulmonary:     Effort: Pulmonary effort is normal.     Breath sounds: Normal breath sounds.  Abdominal:     General: Bowel sounds are normal.  Musculoskeletal:     Comments: Right medial bowing of knee; unsteady gait; had PT recently but for he right ankle fx  Skin:    General: Skin is warm and dry.     Capillary Refill: Capillary refill takes less than 2 seconds.  Neurological:     General: No focal deficit present.     Mental Status: She is alert and oriented to person, place, and time.     Motor: No weakness.     Gait: Gait abnormal.     Comments: But short term memory loss   Psychiatric:        Mood and Affect: Mood normal.     Labs reviewed: Basic Metabolic Panel: Recent Labs    09/03/17  NA 139  K 4.9  BUN 20  CREATININE 0.8   Liver Function Tests: No results for input(s): AST, ALT, ALKPHOS, BILITOT, PROT, ALBUMIN in the last 8760 hours. No results for input(s): LIPASE, AMYLASE in the last 8760 hours. No results for input(s): AMMONIA in the last 8760 hours. CBC: Recent Labs    09/03/17  WBC 6.1  HGB 11.9*  HCT 35*  PLT 158   Lipid Panel: Recent Labs     09/03/17  CHOL 132  HDL 75*  LDLCALC 49  TRIG 55   Lab Results  Component Value Date   HGBA1C 5.5 01/12/2013    Procedures since last visit: No results found.  Assessment/Plan 1. Primary insomnia - mirtazapine (REMERON) 7.5 MG tablet; Take 1 tablet (7.5 mg total) by mouth at bedtime.  Dispense: 30 tablet; Refill: 3 - unclear how much of this is due to OAB vs actual insomnia  2. Mild cognitive impairment with memory loss -her daughter is with her and helps her with history to some extent as pt does not have good temporal memory   3. Rheumatoid arthritis involving hand with negative rheumatoid factor, unspecified laterality ([place]) -continues on enbrel with rheum  4. Medication monitoring encounter -will check mag level to see if that was reason for magnesium (but we do suspect it was for leg cramps which she still has anyway)  5. Overactive bladder -may need myrbetriq if still unable to sleep or frequency in days remains an issue--no signs of UTI  Labs/tests ordered:  Cbc, cmp, magnesium in am due to meds Next appt:  08/20/2018  Tayra Dawe L. Francoise Chojnowski, D.O. [facility] [address], [place] [address] Cell Phone (Mon-Fri 8am-5pm):  [phone] On Call:  [phone] & follow prompts after 5pm & weekends Office Phone:  [phone] Office Fax:  [phone]

## 2018-05-08 ENCOUNTER — Encounter: Payer: Self-pay | Admitting: Internal Medicine

## 2018-05-08 DIAGNOSIS — I10 Essential (primary) hypertension: Secondary | ICD-10-CM | POA: Diagnosis not present

## 2018-05-08 DIAGNOSIS — Z5181 Encounter for therapeutic drug level monitoring: Secondary | ICD-10-CM | POA: Diagnosis not present

## 2018-05-08 DIAGNOSIS — Z79899 Other long term (current) drug therapy: Secondary | ICD-10-CM | POA: Diagnosis not present

## 2018-05-08 LAB — CBC AND DIFFERENTIAL
HCT: 35 — AB (ref 36–46)
Hemoglobin: 12 (ref 12.0–16.0)
Platelets: 188 (ref 150–399)
WBC: 8.9

## 2018-05-08 LAB — HEPATIC FUNCTION PANEL
ALT: 20 (ref 7–35)
AST: 25 (ref 13–35)
Alkaline Phosphatase: 88 (ref 25–125)
Bilirubin, Total: 0.5

## 2018-05-08 LAB — BASIC METABOLIC PANEL
BUN: 21 (ref 4–21)
Creatinine: 0.8 (ref 0.5–1.1)
Glucose: 96
Potassium: 5.4 — AB (ref 3.4–5.3)
Sodium: 136 — AB (ref 137–147)

## 2018-05-09 ENCOUNTER — Encounter: Payer: Self-pay | Admitting: Internal Medicine

## 2018-05-12 DIAGNOSIS — M7130 Other bursal cyst, unspecified site: Secondary | ICD-10-CM | POA: Diagnosis not present

## 2018-05-12 DIAGNOSIS — M4316 Spondylolisthesis, lumbar region: Secondary | ICD-10-CM | POA: Diagnosis not present

## 2018-05-12 DIAGNOSIS — M545 Low back pain: Secondary | ICD-10-CM | POA: Diagnosis not present

## 2018-05-12 DIAGNOSIS — M48061 Spinal stenosis, lumbar region without neurogenic claudication: Secondary | ICD-10-CM | POA: Diagnosis not present

## 2018-05-12 DIAGNOSIS — M5416 Radiculopathy, lumbar region: Secondary | ICD-10-CM | POA: Diagnosis not present

## 2018-05-14 ENCOUNTER — Telehealth: Payer: Self-pay

## 2018-05-14 NOTE — Telephone Encounter (Signed)
She is at increased risk for stroke, however, I agree that holding Eliquis 3 days prior to spinal procedure is recommended. Bridging during that time is of little utility.  Dr. Lemmie Evens

## 2018-05-14 NOTE — Telephone Encounter (Signed)
   [facility] Pre-operative Risk Assessment    Request for surgical clearance:  1. What type of surgery is being performed? ESI Lumbar  2. When is this surgery scheduled? TBD  3. What type of clearance is required (medical clearance vs. Pharmacy clearance to hold med vs. Both)? Pharmacy  4. Are there any medications that need to be held prior to surgery and how long? Eliquis hold 3 days prior  5. Practice name and name of physician performing surgery? [facility]  [place]  6. What is your office phone number [phone]   7.   What is your office fax number [phone]  8.   Anesthesia type  Not listed   Kathyrn Lass 05/14/2018, 2:05 PM  _________________________________________________________________   (provider comments below)

## 2018-05-14 NOTE — Telephone Encounter (Addendum)
Patient with diagnosis of afib on Eliquis for anticoagulation.    Procedure: ESI Lumbar Date of procedure: TBD  CHADS2-VASc score of  6 (CHF, HTN, AGE, DM2, stroke/tia x 2, CAD, AGE, female)  Per office policy would recommend holding Eliquis for 3 days prior to spinal procedure. However, due to patients history of multiple strokes, he is at high risk of clot/stroke. I will forward to Dr. Debara Pickett for his opinion on length of hold.

## 2018-05-15 NOTE — Telephone Encounter (Signed)
Ok to hold Eliquis for 3 days prior to procedure per Dr Debara Pickett.

## 2018-05-18 ENCOUNTER — Encounter: Payer: Self-pay | Admitting: Internal Medicine

## 2018-05-21 NOTE — Telephone Encounter (Signed)
   Primary Cardiologist: No primary care provider on file.  Chart reviewed as part of pre-operative protocol coverage. Given past medical history and time since last visit, based on ACC/AHA guidelines, Janeliz [place]  Can have procedure done.  Per Dr. Debara Pickett, She is at increased risk for stroke, however, he agrees that holding Eliquis 3 days prior to spinal procedure is recommended. Bridging during that time is of little utility.   I will route this recommendation to the requesting party via Epic fax function and remove from pre-op pool.  Please call with questions.  Lyda Jester, PA-C 05/21/2018, 4:46 PM

## 2018-05-21 NOTE — Telephone Encounter (Addendum)
°  Surgical date updated to 05/27/18. Please advise is medically cleared, or does patient need to be seen 3/2 to determine    Please call  [phone] Ext [phone] [facility]

## 2018-05-26 ENCOUNTER — Encounter: Payer: Self-pay | Admitting: Internal Medicine

## 2018-05-26 ENCOUNTER — Ambulatory Visit (INDEPENDENT_AMBULATORY_CARE_PROVIDER_SITE_OTHER): Payer: Medicare Other | Admitting: Internal Medicine

## 2018-05-26 VITALS — BP 126/62 | HR 79 | Ht 62.0 in | Wt 128.4 lb

## 2018-05-26 DIAGNOSIS — Z9889 Other specified postprocedural states: Secondary | ICD-10-CM

## 2018-05-26 DIAGNOSIS — I4821 Permanent atrial fibrillation: Secondary | ICD-10-CM | POA: Diagnosis not present

## 2018-05-26 NOTE — Progress Notes (Signed)
OFFICE NOTE  Chief Complaint:  Establish cardiologist  Primary Care Physician: Gayland Curry, DO  HPI:  Kathy Velez is a 85 y.o. female with a past medial history significant for valvular heart disease status post mitral and tricuspid valve repair in 2015 at [facility] cardiology in the [place] area, history of permanent atrial fibrillation on anticoagulation, history of TIAs and several other medical problems including rheumatoid arthritis, who recently moved to [place] to be closer to her daughter.  She established care with Dr. Mariea Clonts and was referred to me for cardiology.  I had previously seen her in the hospital in January 2017 for atrial flutter and chest pain at the request of Dr. Vertell Limber.  Records indicate she had mitral valve repair in 2014 with Gore-Tex cords and a 30 mm physio-ring and a tricuspid repair with a 28 mm triad ring.  Her last cardiac catheterization was in 2014 prior to repair which was facilitated by intra-aortic balloon pump.  Her coronary arteries were noted to be normal.  In 2017 she underwent lumbar decompression and surgery by Dr. Vertell Limber.  Since that time she is done fairly well although has had progressive degenerative rheumatoid arthritis.  She denies any recurrent chest pain.  She has been in persistent A. fib.  EKG shows rate controlled A. fib today.  She is on Eliquis for anticoagulation and aspirin.  05/26/2018  Kathy Velez is seen today for follow-up.  She is scheduled to have back injection tomorrow due to significant pain and deformity related to rheumatoid arthritis.  She is now been off of her Eliquis for 3 days as instructed for the procedure advised her to restart it more than 24 hours after.  She is also intermittently taking aspirin.  She has history of heart valve repair in 2014 or 2015.  She has not had that reassessed since she has been here in [place].  Pressures well controlled today.  Most recent lab work from June 2019 showed total cholesterol 132,  HDL 75, LDL 49 and triglycerides 55.  PMHx:  Past Medical History:  Diagnosis Date  . Anemia   . Anxiety   . Arthritis    Back  . Atrial fibrillation ([place])   . Cancer [facility]) 2006   Colon.  Basal Cell Skin cancer- right arm  . Colon cancer ([place])   . Constipation due to pain medication therapy    after heart surgery  . Dysrhythmia    PAF  . GERD (gastroesophageal reflux disease)   . Heart murmur   . Hypertension   . Hypothyroidism   . RA (rheumatoid arthritis) ([place])   . Restless leg   . Seizures ([place])    after Heart Surgey  . Stroke [facility])    TIA- found by neurologist after     Past Surgical History:  Procedure Laterality Date  . ABDOMINAL HYSTERECTOMY  1970   Partial   . COLON RESECTION  2006   cancer  . COLONOSCOPY    . EYE SURGERY Bilateral    Cataract  . MAXIMUM ACCESS (MAS)POSTERIOR LUMBAR INTERBODY FUSION (PLIF) 2 LEVEL N/A 04/12/2015   Procedure: Lumbar Three-Five Decompression, Pedicle Screw Fixation, and Posteriolateral Arthrodesis;  Surgeon: Erline Levine, MD;  Location: [place] NEURO ORS;  Service: Neurosurgery;  Laterality: N/A;  L3-4 L4-5 Maximum access posterior lumbar fusion, possible interbodies and resection of synovial cyst at L4-5  . MITRAL VALVE REPAIR  01/20/13   Gore-tex cords to P1, P2, and P3. Magic suture to posterior medial commisure, #30  Physio 1 ring. Done in [place]  . TONSILLECTOMY     about 1940  . TRICUSPID VALVE SURGERY  01/20/13   #28 TriAd ring done in [place]    FAMHx:  Family History  Problem Relation Age of [place]  . Lung disease Mother 67  . Alcohol abuse Son   . Heart disease Father 56    SOCHx:   reports that she has never smoked. She has never used smokeless tobacco. She reports current alcohol use of about 1.0 standard drinks of alcohol per week. She reports that she does not use drugs.  ALLERGIES:  Allergies  Allergen Reactions  . Codeine Other (See Comments)    "just don't take it well"  . Molds & Smuts   . Pollen  Extract Swelling  . Bee Venom Swelling and Rash    ROS: Pertinent items noted in HPI and remainder of comprehensive ROS otherwise negative.  HOME MEDS: Current Outpatient Medications on File Prior to Visit  Medication Sig Dispense Refill  . acetaminophen (TYLENOL) 500 MG tablet Take 1,000 mg by mouth every 6 (six) hours as needed for moderate pain.    Marland Kitchen apixaban (ELIQUIS) 2.5 MG TABS tablet Take 1 tablet (2.5 mg total) by mouth 2 (two) times daily. 60 tablet 0  . aspirin EC 81 MG tablet Take 81 mg by mouth daily.    Marland Kitchen atorvastatin (LIPITOR) 20 MG tablet Take 20 mg by mouth daily.    . Azelastine-Fluticasone 137-50 MCG/ACT SUSP Place 1 spray into the nose daily as needed (allergies).     . Biotin 5000 MCG CAPS Take 1 capsule by mouth daily.    . cetirizine (ZYRTEC) 10 MG tablet Take 1 tablet (10 mg total) by mouth daily. 30 tablet 3  . cholecalciferol (VITAMIN D) 1000 units tablet Take 1,000 Units by mouth daily.    . digoxin (LANOXIN) 0.125 MG tablet Take 1 tablet (0.125 mg total) by mouth daily. 30 tablet 0  . ENBREL SURECLICK 50 MG/ML injection     . levothyroxine (SYNTHROID, LEVOTHROID) 75 MCG tablet Take 75 mcg by mouth daily before breakfast. For Hypothyroidism    . magnesium oxide (MAG-OX) 400 MG tablet Take 400 mg by mouth daily.    . metoprolol succinate (TOPROL-XL) 50 MG 24 hr tablet Take 50 mg by mouth daily.     . mirtazapine (REMERON) 7.5 MG tablet Take 1 tablet (7.5 mg total) by mouth at bedtime. 30 tablet 3  . Multiple Vitamins-Minerals (PRESERVISION AREDS 2) CAPS Take 1 Dose by mouth 2 (two) times daily. For eye health    . omeprazole (PRILOSEC) 20 MG capsule Take 1 capsule (20 mg total) by mouth daily. 30 capsule 3  . polycarbophil (FIBERCON) 625 MG tablet Take 625 mg by mouth daily.    . valsartan (DIOVAN) 80 MG tablet Take 80 mg by mouth daily.    . vitamin B-12 (CYANOCOBALAMIN) 1000 MCG tablet Take 1,000 mcg by mouth daily.      No current facility-administered  medications on file prior to visit.     LABS/IMAGING: No results found for this or any previous visit (from the past 48 hour(s)). No results found.  LIPID PANEL:    Component Value Date/Time   CHOL 132 09/03/2017   TRIG 55 09/03/2017   HDL 75 (A) 09/03/2017   LDLCALC 49 09/03/2017     WEIGHTS: Wt Readings from Last 3 Encounters:  05/26/18 128 lb 6.4 oz (58.2 kg)  05/07/18 128 lb (58.1 kg)  04/16/18  131 lb (59.4 kg)    VITALS: BP 126/62   Pulse 79   Ht 5\' 2"  (1.575 m)   Wt 128 lb 6.4 oz (58.2 kg)   BMI 23.48 kg/m   EXAM: General appearance: alert and no distress Neck: no carotid bruit, no JVD and thyroid not enlarged, symmetric, no tenderness/mass/nodules Lungs: clear to auscultation bilaterally Heart: irregularly irregular rhythm, S1, S2 normal and systolic murmur: early systolic 2/6, blowing at 2nd right intercostal space Abdomen: soft, non-tender; bowel sounds normal; no masses,  no organomegaly Extremities: Significant ulnar deviation of the phalanges and rheumatoid arthritis changes Pulses: 2+ and symmetric Skin: Skin color, texture, turgor normal. No rashes or lesions Neurologic: Grossly normal Psych: Pleasant, slow speech  EKG: A. fib at 79, nonspecific ST changes-personally reviewed  ASSESSMENT: 1. Permanent A. fib on Eliquis (CHADSVASC score 6) 2. History of MVR/TVR-2014, Dr. Alroy Dust 3. History of TIA 4. Hypertension 5. Degenerative rheumatoid arthritis  PLAN: 1.   Ms. Pettibone is at acceptable risk for injections in her spine.  She is holding her Eliquis but will resume it more than 24 hours afterwards.  She will need reassessment of her mitral and tricuspid valve replacement from 2014 which was performed in [place].  We will repeat an echo in 6 months and plan to review that and see her back at that time.  Follow-up with me in 6 months.  Pixie Casino, MD, [facility], [place] Director of the Advanced Lipid Disorders  &  Cardiovascular Risk Reduction Clinic Diplomate of the American Board of Clinical Lipidology Attending Cardiologist  Direct Dial: [phone]  Fax: [phone]  Website:  [email or web address] 05/26/2018, 1:52 PM

## 2018-05-26 NOTE — Patient Instructions (Signed)
Medication Instructions:  Your Physician recommend you continue on your current medication as directed.    If you need a refill on your cardiac medications before your next appointment, please call your pharmacy.   Lab work:  None    Testing/Procedures: Your physician has requested that you have an echocardiogram in 6 months. Echocardiography is a painless test that uses sound waves to create images of your heart. It provides your doctor with information about the size and shape of your heart and how well your heart's chambers and valves are working. This procedure takes approximately one hour. There are no restrictions for this procedure. [place] [address]   Follow-Up: At [facility], you and your health needs are our priority.  As part of our continuing mission to provide you with exceptional heart care, we have created designated Provider Care Teams.  These Care Teams include your primary Cardiologist (physician) and Advanced Practice Providers (APPs -  Physician Assistants and Nurse Practitioners) who all work together to provide you with the care you need, when you need it. You will need a follow up appointment in 6 months.  Please call our office 2 months in advance to schedule this appointment.  You may see Pixie Casino, MD or one of the following Advanced Practice Providers on your designated Care Team: [place], [place] . Fabian Sharp, PA-C

## 2018-05-27 DIAGNOSIS — M545 Low back pain: Secondary | ICD-10-CM | POA: Diagnosis not present

## 2018-06-09 ENCOUNTER — Other Ambulatory Visit: Payer: Self-pay | Admitting: *Deleted

## 2018-06-09 MED ORDER — APIXABAN 2.5 MG PO TABS: 2.5000 mg | ORAL_TABLET | Freq: Two times a day (BID) | ORAL | 1 refills | Status: DC

## 2018-06-09 MED ORDER — ATORVASTATIN CALCIUM 20 MG PO TABS: 20.0000 mg | ORAL_TABLET | Freq: Every day | ORAL | 1 refills | Status: DC

## 2018-06-09 MED ORDER — METOPROLOL SUCCINATE ER 50 MG PO TB24: 50.0000 mg | ORAL_TABLET | Freq: Every day | ORAL | 1 refills | Status: DC

## 2018-07-15 ENCOUNTER — Other Ambulatory Visit: Payer: Self-pay | Admitting: *Deleted

## 2018-07-15 DIAGNOSIS — M150 Primary generalized (osteo)arthritis: Secondary | ICD-10-CM | POA: Diagnosis not present

## 2018-07-15 DIAGNOSIS — M0579 Rheumatoid arthritis with rheumatoid factor of multiple sites without organ or systems involvement: Secondary | ICD-10-CM | POA: Diagnosis not present

## 2018-07-15 DIAGNOSIS — F5101 Primary insomnia: Secondary | ICD-10-CM

## 2018-07-15 DIAGNOSIS — M5136 Other intervertebral disc degeneration, lumbar region: Secondary | ICD-10-CM | POA: Diagnosis not present

## 2018-07-15 MED ORDER — MIRTAZAPINE 7.5 MG PO TABS: 7.5000 mg | ORAL_TABLET | Freq: Every day | ORAL | 3 refills | Status: DC

## 2018-07-15 NOTE — Telephone Encounter (Signed)
[employer]

## 2018-07-16 DIAGNOSIS — M5136 Other intervertebral disc degeneration, lumbar region: Secondary | ICD-10-CM | POA: Diagnosis not present

## 2018-07-16 DIAGNOSIS — Z79899 Other long term (current) drug therapy: Secondary | ICD-10-CM | POA: Diagnosis not present

## 2018-07-16 LAB — BASIC METABOLIC PANEL
BUN: 17 (ref 4–21)
Creatinine: 0.7 (ref 0.5–1.1)
Glucose: 92
Potassium: 4.8 (ref 3.4–5.3)
Sodium: 134 — AB (ref 137–147)

## 2018-07-16 LAB — CBC AND DIFFERENTIAL
HCT: 33 — AB (ref 36–46)
Hemoglobin: 11.7 — AB (ref 12.0–16.0)
Neutrophils Absolute: 5
Platelets: 163 (ref 150–399)
WBC: 9.8

## 2018-07-16 LAB — HEPATIC FUNCTION PANEL
ALT: 17 (ref 7–35)
AST: 18 (ref 13–35)
Alkaline Phosphatase: 89 (ref 25–125)

## 2018-07-18 ENCOUNTER — Encounter: Payer: Self-pay | Admitting: *Deleted

## 2018-07-18 NOTE — Progress Notes (Signed)
Dr. Leigh Aurora, [facility]

## 2018-08-04 DIAGNOSIS — M545 Low back pain: Secondary | ICD-10-CM | POA: Diagnosis not present

## 2018-08-20 ENCOUNTER — Other Ambulatory Visit: Payer: Self-pay

## 2018-08-20 ENCOUNTER — Encounter: Payer: Self-pay | Admitting: Internal Medicine

## 2018-08-20 ENCOUNTER — Non-Acute Institutional Stay: Payer: Medicare Other | Admitting: Internal Medicine

## 2018-08-20 VITALS — BP 112/60 | HR 96 | Temp 98.4°F | Ht 62.0 in | Wt 128.0 lb

## 2018-08-20 DIAGNOSIS — J849 Interstitial pulmonary disease, unspecified: Secondary | ICD-10-CM | POA: Diagnosis not present

## 2018-08-20 DIAGNOSIS — I482 Chronic atrial fibrillation, unspecified: Secondary | ICD-10-CM | POA: Diagnosis not present

## 2018-08-20 DIAGNOSIS — G3184 Mild cognitive impairment, so stated: Secondary | ICD-10-CM | POA: Diagnosis not present

## 2018-08-20 DIAGNOSIS — M06049 Rheumatoid arthritis without rheumatoid factor, unspecified hand: Secondary | ICD-10-CM

## 2018-08-20 DIAGNOSIS — E039 Hypothyroidism, unspecified: Secondary | ICD-10-CM

## 2018-08-20 DIAGNOSIS — M21961 Unspecified acquired deformity of right lower leg: Secondary | ICD-10-CM

## 2018-08-20 NOTE — Progress Notes (Signed)
Location:  Occupational psychologist of Service:  Clinic (12)  Provider: Tyler Cubit L. Mariea Clonts, D.O., C.M.D.  Goals of Care:  Advanced Directives 02/19/2018  Does Patient Have a Medical Advance Directive? No  Type of Advance Directive -  Does patient want to make changes to medical advance directive? -  Copy of [place] in Chart? -     Chief Complaint  Patient presents with  . Medical Management of Chronic Issues    69mth follow-up    HPI: Patient is a 85 y.o. female seen today for medical management of chronic diseases.    She gets to do socially distant visits with her daughter when she brings groceries.    Her knees are getting worse.  Left knee had gone out at Thanksgiving and she went to the hospital.  It went back in place she says by the time she waited to be seen.  Now the right one is going out--pops and pops.  She feels something move in there.  It's not hurting that much.  It's hard to stand from a chair.  She thinks it is getting worse.  Her right ankle is turned out and the knee goes opposite.  She had an injection and is due for another.  [name] seen Dr. Alvan Dame about the knees and ankle.  [facility].  She needs to follow-up with Dr. Alvan Dame.    [name] trying to eat right.  [name] gained weight since moving here.  Says it's all in her midsection and she's disturbed that she looks pregnant when nude (her words).  She is doing Robin's exercises (chair fit on tv).  Sometimes when she stands, she's a little lightheaded.  She does try to drink fluids.  Does drink a lot of decaffeinated tea.  She does drink plain filtered water from her fridge--several glasses per day.  Encouraged hydration and getting up slowly.  Sleep has improved since last visit, but does still have nights where her mind keeps on going.  Uses remeron.    Reports more difficulty with finding words gradually.    She avoids adding salt to food.  She learned a lot about nutrition  early--she leaves off major carbs and sweets.  Her weight is actually stable the past 3 visits in epic.    Breathing is stable.  No chest pain.  Is aware of her afib, but does not feel it unless she's actually checking her pulse.   She is a little short of breath.  Continues with the hoarseness.   It started after her severe bronchitis since before she came to see me the very first time.  She has a little yellow mucus chronically that she coughs up.  Her nasal mucus is always clear.  Ir's not a lot.     She had a virtual visit with rheum and labs with Dr. Amil Amen.    She had 11 seizures after her open heart surgery.  Took meds for a year and then it resolved.  She had to be resuscitated when she had that.  She says if she'd be normal after resuscitation, she would want to be revived, but if her qol would be poor afterwards, she would say no.    Past Medical History:  Diagnosis Date  . Anemia   . Anxiety   . Arthritis    Back  . Atrial fibrillation ([place])   . Cancer [facility]) 2006   Colon.  Basal Cell Skin cancer- right arm  .  Colon cancer ([place])   . Constipation due to pain medication therapy    after heart surgery  . Dysrhythmia    PAF  . GERD (gastroesophageal reflux disease)   . Heart murmur   . Hypertension   . Hypothyroidism   . RA (rheumatoid arthritis) ([place])   . Restless leg   . Seizures ([place])    after Heart Surgey  . Stroke [facility])    TIA- found by neurologist after     Past Surgical History:  Procedure Laterality Date  . ABDOMINAL HYSTERECTOMY  1970   Partial   . COLON RESECTION  2006   cancer  . COLONOSCOPY    . EYE SURGERY Bilateral    Cataract  . MAXIMUM ACCESS (MAS)POSTERIOR LUMBAR INTERBODY FUSION (PLIF) 2 LEVEL N/A 04/12/2015   Procedure: Lumbar Three-Five Decompression, Pedicle Screw Fixation, and Posteriolateral Arthrodesis;  Surgeon: Erline Levine, MD;  Location: [place] NEURO ORS;  Service: Neurosurgery;  Laterality: N/A;  L3-4 L4-5 Maximum access posterior lumbar  fusion, possible interbodies and resection of synovial cyst at L4-5  . MITRAL VALVE REPAIR  01/20/13   Gore-tex cords to P1, P2, and P3. Magic suture to posterior medial commisure, #30 Physio 1 ring. Done in [place]  . TONSILLECTOMY     about 1940  . TRICUSPID VALVE SURGERY  01/20/13   #28 TriAd ring done in [place]    Allergies  Allergen Reactions  . Codeine Other (See Comments)    "just don't take it well"  . Molds & Smuts   . Pollen Extract Swelling  . Bee Venom Swelling and Rash    Outpatient Encounter Medications as of 08/20/2018  Medication Sig  . acetaminophen (TYLENOL) 500 MG tablet Take 1,000 mg by mouth every 6 (six) hours as needed for moderate pain.  Marland Kitchen apixaban (ELIQUIS) 2.5 MG TABS tablet Take 1 tablet (2.5 mg total) by mouth 2 (two) times daily.  Marland Kitchen aspirin EC 81 MG tablet Take 81 mg by mouth daily.  Marland Kitchen atorvastatin (LIPITOR) 20 MG tablet Take 1 tablet (20 mg total) by mouth daily.  . Azelastine-Fluticasone 137-50 MCG/ACT SUSP Place 1 spray into the nose daily as needed (allergies).   . Biotin 5000 MCG CAPS Take 1 capsule by mouth daily.  . cetirizine (ZYRTEC) 10 MG tablet Take 1 tablet (10 mg total) by mouth daily.  . cholecalciferol (VITAMIN D) 1000 units tablet Take 1,000 Units by mouth daily.  . digoxin (LANOXIN) 0.125 MG tablet Take 1 tablet (0.125 mg total) by mouth daily.  [name] SURECLICK 50 MG/ML injection   . levothyroxine (SYNTHROID, LEVOTHROID) 75 MCG tablet Take 75 mcg by mouth daily before breakfast. For Hypothyroidism  . magnesium oxide (MAG-OX) 400 MG tablet Take 400 mg by mouth daily.  . metoprolol succinate (TOPROL-XL) 50 MG 24 hr tablet Take 1 tablet (50 mg total) by mouth daily.  . mirtazapine (REMERON) 7.5 MG tablet Take 1 tablet (7.5 mg total) by mouth at bedtime.  . Multiple Vitamins-Minerals (PRESERVISION AREDS 2) CAPS Take 1 Dose by mouth 2 (two) times daily. For eye health  . omeprazole (PRILOSEC) 20 MG capsule Take 1 capsule (20 mg total) by  mouth daily.  . polycarbophil (FIBERCON) 625 MG tablet Take 625 mg by mouth daily.  . valsartan (DIOVAN) 80 MG tablet Take 80 mg by mouth daily.  . vitamin B-12 (CYANOCOBALAMIN) 1000 MCG tablet Take 1,000 mcg by mouth daily.    No facility-administered encounter medications on file as of 08/20/2018.  Review of Systems:  Review of Systems  Constitutional: Positive for malaise/fatigue. Negative for chills and fever.       Concerned about weight gain but weight is good  HENT: Negative for congestion.   Respiratory: Positive for cough and sputum production. Negative for shortness of breath and wheezing.   Cardiovascular: Negative for chest pain, palpitations, orthopnea, leg swelling and PND.  Genitourinary: Negative for dysuria.  Musculoskeletal: Positive for back pain, joint pain, myalgias and neck pain. Negative for falls.  Skin: Negative for itching and rash.  Neurological: Negative for dizziness, seizures and loss of consciousness.       No seizures since after her heart surgery  Endo/Heme/Allergies: Bruises/bleeds easily.  Psychiatric/Behavioral: Positive for memory loss. Negative for depression. The patient is nervous/anxious and has insomnia.        Word-finding challenges    Health Maintenance  Topic Date Due  . INFLUENZA VACCINE  10/25/2018  . TETANUS/TDAP  04/16/2027  . DEXA SCAN  Completed  . PNA vac Low Risk Adult  Completed    Physical Exam: Vitals:   08/20/18 1402  BP: 112/60  Pulse: 96  Temp: 98.4 F (36.9 C)  TempSrc: Oral  SpO2: 96%  Weight: 128 lb (58.1 kg)  Height: 5\' 2"  (1.575 m)   Body mass index is 23.41 kg/m. Physical Exam Vitals signs reviewed.  Constitutional:      General: She is not in acute distress.    Appearance: Normal appearance. She is normal weight. She is not ill-appearing or toxic-appearing.  HENT:     Head: Normocephalic and atraumatic.  Cardiovascular:     Rate and Rhythm: Rhythm irregular.     Heart sounds: Murmur present.   Pulmonary:     Effort: Pulmonary effort is normal.     Breath sounds: Normal breath sounds. No wheezing, rhonchi or rales.  Abdominal:     General: Bowel sounds are normal.     Palpations: Abdomen is soft.  Musculoskeletal:        General: Swelling and deformity present.     Comments: Deformity of right ankle and compensatory deformity of right knee  Skin:    General: Skin is warm and dry.  Neurological:     General: No focal deficit present.     Mental Status: She is alert and oriented to person, place, and time.     Cranial Nerves: No cranial nerve deficit.  Psychiatric:        Mood and Affect: Mood normal.        Behavior: Behavior normal.        Thought Content: Thought content normal.        Judgment: Judgment normal.     Labs reviewed: Basic Metabolic Panel: Recent Labs    09/03/17 05/08/18 0500 07/16/18  NA 139 136* 134*  K 4.9 5.4* 4.8  BUN 20 21 17   CREATININE 0.8 0.8 0.7   Liver Function Tests: Recent Labs    05/08/18 0500 07/16/18  AST 25 18  ALT 20 17  ALKPHOS 88 89   No results for input(s): LIPASE, AMYLASE in the last 8760 hours. No results for input(s): AMMONIA in the last 8760 hours. CBC: Recent Labs    09/03/17 05/08/18 0500 07/16/18  WBC 6.1 8.9 9.8  NEUTROABS  --   --  5  HGB 11.9* 12.0 11.7*  HCT 35* 35* 33*  PLT 158 188 163   Lipid Panel: Recent Labs    09/03/17  CHOL 132  HDL 75*  LDLCALC 49  TRIG 55   Lab Results  Component Value Date   HGBA1C 5.5 01/12/2013   Reviewed labs and reports from her specialists including rheumatology, cardiology, neurosurgery  Assessment/Plan 1. Mild cognitive impairment with memory loss -primarily bothered by word-finding struggles, but does have some short-term memory loss noted where she may forget questions she intended to ask or repeat herself a bit -not on aricept or namenda, but with her other medications and cardiac history, I'm not sure the benefits will outweigh the risks  2.  Rheumatoid arthritis involving hand with negative rheumatoid factor, unspecified laterality ([place]) -ongoing, followed closely with rheum -continues on enbrel therapy, tylenol  3. Chronic atrial fibrillation -continue digoxin, eliquis, toprol xl -rate is controlled and she follows with cardiology -blood counts and renal function are stable -no major bleeding or bruising  4. Acquired deformity of right knee (genu valgum) -due to prior ankle injury and deformity -f/u with Dr. Alvan Dame for this uncontrolled pain  5. Interstitial lung disease (HCC) -some dyspnea on exertion -not on meds for this and her sats are within normal range  6.  Hypothyroidism:   -needs tsh checked  Labs/tests ordered:  Get TSH before next visit Next appt:  11/26/2018 med mgt  Jumanah Hynson L. Rad Gramling, D.O. [facility] [address], [place] [address] Cell Phone (Mon-Fri 8am-5pm):  [phone] On Call:  [phone] & follow prompts after 5pm & weekends Office Phone:  [phone] Office Fax:  [phone]

## 2018-08-20 NOTE — Patient Instructions (Addendum)
Follow-up with Dr. Alvan Dame about your right knee and ankle.    Continue with chairfit.  Get up slowly and drink plenty of water.    Discuss with your daughter about whether you would want CPR again if you had a cardiac arrest.

## 2018-09-14 ENCOUNTER — Encounter: Payer: Self-pay | Admitting: Internal Medicine

## 2018-09-15 ENCOUNTER — Encounter: Payer: Self-pay | Admitting: Gastroenterology

## 2018-09-15 ENCOUNTER — Other Ambulatory Visit: Payer: Self-pay | Admitting: Internal Medicine

## 2018-09-15 DIAGNOSIS — M06049 Rheumatoid arthritis without rheumatoid factor, unspecified hand: Secondary | ICD-10-CM

## 2018-09-15 DIAGNOSIS — R140 Abdominal distension (gaseous): Secondary | ICD-10-CM

## 2018-09-15 DIAGNOSIS — M21961 Unspecified acquired deformity of right lower leg: Secondary | ICD-10-CM

## 2018-09-15 DIAGNOSIS — M170 Bilateral primary osteoarthritis of knee: Secondary | ICD-10-CM

## 2018-09-18 ENCOUNTER — Telehealth: Payer: Self-pay

## 2018-09-18 NOTE — Telephone Encounter (Signed)
Patient called to ask if she could be tested for the Covid-19 because she is going out of town for a few days and the flyer at [facility] says if they want to shorten there quarantine time they should ask there provider to test them so she wants to schedule an appointment to be tested I tried to explain to her we don't test here and the Providers have a certain criteria before they send you for testing   Please Advise

## 2018-09-18 NOTE — Telephone Encounter (Signed)
Spoke with patient to get clarification, pt would need to speak with Kathy Velez and [facility] about reducing the 14 day quarantine, pt would only have to quarantine for 4 days after she returns from out of town if pt can get tested. I advised pt that we only test if exposure or have symptoms.  Will route to Dr. Mariea Clonts for [facility]

## 2018-09-29 ENCOUNTER — Telehealth: Payer: Self-pay | Admitting: *Deleted

## 2018-09-29 NOTE — Telephone Encounter (Signed)
Patient aware to await call from [facility] for appointment date and time to get Covid Testing

## 2018-09-29 NOTE — Telephone Encounter (Signed)
Patient called requesting to speak with Rodena Piety. Patient aware Ellyn was unavailable and I could assist her. Patient states [name] would prefer to not have their drivers assist in transporting patients that are on quaratine restrictions. Patient will plan on driving herself. Patient was provided with the [facility] location address. Patient would like a return call once order placed

## 2018-09-29 NOTE — Telephone Encounter (Signed)
Patient stated that she will speak with [name] to see how she can go about being tested there. Stated that they will not let her leave the facility for 14 days.

## 2018-09-29 NOTE — Telephone Encounter (Signed)
Patient called and stated that she has been out of town. Stated that she lives at [facility] and has to be Quarantine and tested for COVID 19. Patient is wanting an order to be placed so she can be tested. Please Advise.

## 2018-09-29 NOTE — Telephone Encounter (Signed)
I actually heard you talking to her and placed the message to the Patient Kathy Velez at that time.  She will hear from them about when to go get the test done.

## 2018-09-29 NOTE — Telephone Encounter (Signed)
Does she drive?  If so, we can send the message through the [facility] for her to get tested at the [facility] site.  She will still need to quarantine for the 14 days until we get her negative result back.    If she does not drive, she may need to get her test done somehow at [facility].

## 2018-09-30 ENCOUNTER — Other Ambulatory Visit: Payer: Self-pay

## 2018-09-30 ENCOUNTER — Telehealth: Payer: Self-pay | Admitting: *Deleted

## 2018-09-30 DIAGNOSIS — R6889 Other general symptoms and signs: Secondary | ICD-10-CM | POA: Diagnosis not present

## 2018-09-30 DIAGNOSIS — Z20822 Contact with and (suspected) exposure to covid-19: Secondary | ICD-10-CM

## 2018-09-30 NOTE — Telephone Encounter (Signed)
Pt scheduled for covid testing today @ 2:45 @ [facility]. Instructions given and order placed

## 2018-09-30 NOTE — Telephone Encounter (Signed)
-----   Message from Gayland Curry, DO sent at 09/29/2018  1:49 PM EDT ----- Regarding: covid-19 test Patient traveled over the holiday weekend and is now to quarantine for 14 days at [facility].  A covid-19 test is needed, please.   Thanks, Tiffany L. Reed, D.O. [facility] [address], [place] [address] Cell Phone (Mon-Fri 8am-5pm):  [phone] On Call:  [phone] & follow prompts after 5pm & weekends Office Phone:  [phone] Office Fax:  [phone]

## 2018-10-03 ENCOUNTER — Encounter: Payer: Self-pay | Admitting: Gastroenterology

## 2018-10-03 ENCOUNTER — Ambulatory Visit (INDEPENDENT_AMBULATORY_CARE_PROVIDER_SITE_OTHER): Payer: Medicare Other | Admitting: Gastroenterology

## 2018-10-03 ENCOUNTER — Other Ambulatory Visit: Payer: Self-pay

## 2018-10-03 VITALS — Ht 62.0 in | Wt 125.0 lb

## 2018-10-03 DIAGNOSIS — R140 Abdominal distension (gaseous): Secondary | ICD-10-CM

## 2018-10-03 MED ORDER — OMEPRAZOLE 40 MG PO CPDR: 40.0000 mg | DELAYED_RELEASE_CAPSULE | Freq: Every day | ORAL | 3 refills | Status: DC

## 2018-10-03 NOTE — Progress Notes (Signed)
TELEHEALTH VISIT  Referring Provider: Gayland Curry, DO Primary Care Physician:  Gayland Curry, DO   Tele-visit due to COVID-19 pandemic Patient requested visit virtually, consented to the virtual encounter via video enabled telemedicine application (Zoom) Contact made at: 03:55 10/03/18 Patient verified by name and date of birth Location of patient: Home Location provider: [facility] Names of persons participating: Me, patient, her daughter Kathy Velez), [place] Time spent on telehealth visit: 45 minutes I discussed the limitations of evaluation and management by telemedicine. The patient expressed understanding and agreed to proceed.  Reason for Consultation:  Bloating   IMPRESSION:  Bloating Change in abdominal girth Reflux on daily omeprazole Personal history of colon cancer diagnosed 2007 s/p resection   PLAN: CT of the abdomen and pelvis with contrast Increase omeprazole 40 mg daily in the event that bloating is related to reflux Trial of Align in the meantime Breath test for bacterial overgrowth if the CT scan is negative Follow-up after   Please see the "Patient Instructions" section for addition details about the plan.  HPI: Kathy Velez is a 85 y.o. female referred by Dr. Mariea Clonts. The history is obtained through the patient and review of her electronic health record. Her husband was a Dealer in [place], [place] of [place]. Moved to [place] to be closer to her daughter.  Currently lives at [facility].   Constant bloating since she moved to [place]. She is concerned that she has gained weight in the abdomen. Has gained 7-8 pounds. She is 5'2".  Associated tightness.  She takes FiberCon every night. Has a regular BM daily, if not multiple times. No change in bowel habits.  Early morning defecation. No change in symptoms after defecation, eating, or movement. Sense of incomplete evacuation.  No soiling or accidents.  She has a  many years history of malodorous flatus. No significant eructation. No other associated symptoms. No identified exacerbating or relieving features.   [name] trying to eat right. Participating in group exercise through [facility] and staying as active as she can.   Daughter is concerned about her abdominal rigidity. Daughter notes gas and reflux. Takes Prilosec OTC most days.   Records from Dr. Mariea Clonts show stable weights.   Diagnosed with colon cancer 2007. Treated with resection and chemotherapy. Routine surveillance since that time.   Colonoscopy with Dr. Hulan Saas 02/06/17 for a history of colon cancer showed left sided diverticulosis.   No known family history of colon cancer or polyps. No family history of uterine/endometrial cancer, pancreatic cancer or gastric/stomach cancer.  Past Medical History:  Diagnosis Date  . Anemia   . Anxiety   . Arthritis    Back  . Atrial fibrillation ([place])   . Cancer [facility]) 2006   Colon.  Basal Cell Skin cancer- right arm  . Colon cancer ([place])   . Constipation due to pain medication therapy    after heart surgery  . Dysrhythmia    PAF  . GERD (gastroesophageal reflux disease)   . Heart murmur   . Hypertension   . Hypothyroidism   . RA (rheumatoid arthritis) ([place])   . Restless leg   . Seizures ([place])    after Heart Surgey  . Stroke [facility])    TIA- found by neurologist after     Past Surgical History:  Procedure Laterality Date  . ABDOMINAL HYSTERECTOMY  1970   Partial   . COLON RESECTION  2006   cancer  . COLONOSCOPY    .  EYE SURGERY Bilateral    Cataract  . MAXIMUM ACCESS (MAS)POSTERIOR LUMBAR INTERBODY FUSION (PLIF) 2 LEVEL N/A 04/12/2015   Procedure: Lumbar Three-Five Decompression, Pedicle Screw Fixation, and Posteriolateral Arthrodesis;  Surgeon: Erline Levine, MD;  Location: [facility];  Service: Neurosurgery;  Laterality: N/A;  L3-4 L4-5 Maximum access posterior lumbar fusion, possible interbodies and resection of synovial cyst at  L4-5  . MITRAL VALVE REPAIR  01/20/13   Gore-tex cords to P1, P2, and P3. Magic suture to posterior medial commisure, #30 Physio 1 ring. Done in [place]  . TONSILLECTOMY     about 1940  . TRICUSPID VALVE SURGERY  01/20/13   #28 TriAd ring done in [place]    Current Outpatient Medications  Medication Sig Dispense Refill  . acetaminophen (TYLENOL) 500 MG tablet Take 1,000 mg by mouth every 6 (six) hours as needed for moderate pain.    Marland Kitchen apixaban (ELIQUIS) 2.5 MG TABS tablet Take 1 tablet (2.5 mg total) by mouth 2 (two) times daily. 180 tablet 1  . aspirin EC 81 MG tablet Take 81 mg by mouth daily.    Marland Kitchen atorvastatin (LIPITOR) 20 MG tablet Take 1 tablet (20 mg total) by mouth daily. 90 tablet 1  . Azelastine-Fluticasone 137-50 MCG/ACT SUSP Place 1 spray into the nose daily as needed (allergies).     . Biotin 5000 MCG CAPS Take 1 capsule by mouth daily.    . cetirizine (ZYRTEC) 10 MG tablet Take 1 tablet (10 mg total) by mouth daily. 30 tablet 3  . cholecalciferol (VITAMIN D) 1000 units tablet Take 1,000 Units by mouth daily.    . digoxin (LANOXIN) 0.125 MG tablet Take 1 tablet (0.125 mg total) by mouth daily. 30 tablet 0  . ENBREL SURECLICK 50 MG/ML injection     . levothyroxine (SYNTHROID, LEVOTHROID) 75 MCG tablet Take 75 mcg by mouth daily before breakfast. For Hypothyroidism    . magnesium oxide (MAG-OX) 400 MG tablet Take 400 mg by mouth daily.    . metoprolol succinate (TOPROL-XL) 50 MG 24 hr tablet Take 1 tablet (50 mg total) by mouth daily. 90 tablet 1  . mirtazapine (REMERON) 7.5 MG tablet Take 1 tablet (7.5 mg total) by mouth at bedtime. 90 tablet 3  . Multiple Vitamins-Minerals (PRESERVISION AREDS 2) CAPS Take 1 Dose by mouth 2 (two) times daily. For eye health    . omeprazole (PRILOSEC) 20 MG capsule Take 1 capsule (20 mg total) by mouth daily. 30 capsule 3  . polycarbophil (FIBERCON) 625 MG tablet Take 625 mg by mouth daily.    . valsartan (DIOVAN) 80 MG tablet Take 80 mg by  mouth daily.    . vitamin B-12 (CYANOCOBALAMIN) 1000 MCG tablet Take 1,000 mcg by mouth daily.      No current facility-administered medications for this visit.     Allergies as of 10/03/2018 - Review Complete 10/03/2018  Allergen Reaction Noted  . Codeine Other (See Comments) 03/30/2015  . Molds & smuts  07/02/2011  . Pollen extract Swelling 10/07/2017  . Bee venom Swelling and Rash 03/30/2015    Family History  Problem Relation Age of Onset  . Lung disease Mother 48  . Alcohol abuse Son   . Heart disease Father 18    Social History   Socioeconomic History  . Marital status: Widowed    Spouse name: Not on file  . Number of children: Not on file  . Years of education: Not on file  . Highest education level: Not  on file  Occupational History  . 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 Financial resource strain: Not hard at all  . Food insecurity    Worry: Never true    Inability: Never true  . Transportation needs    Medical: No    Non-medical: No  Tobacco Use  . Smoking status: Never Smoker  . Smokeless tobacco: Never Used  Substance and Sexual Activity  . Alcohol use: Yes    Alcohol/week: 1.0 standard drinks    Types: 1 Glasses of wine per week  . Drug use: No  . Sexual activity: Not Currently  Lifestyle  . Physical activity    Days per week: 6 days    Minutes per session: 30 min  . Stress: Only a little  Relationships  . Social connections    Talks on phone: More than three times a week    Gets together: More than three times a week    Attends religious service: Never    Active member of club or organization: No    Attends meetings of clubs or organizations: Never    Relationship status: Widowed  . Intimate partner violence    Fear of current or ex partner: No    Emotionally abused: No    Physically abused: No    Forced sexual activity: No  Other Topics Concern  . Not on file  Social History Narrative   Social  History      Diet? Healthy- low salt, sugar, fat      Do you drink/eat things with caffeine? On occasion      Marital status?        widow                        What year were you married? 1958      Do you live in a house, apartment, assisted living, condo, trailer, etc.? apartment      Is it one or more stories? one      How many persons live in your home? one      Do you have any pets in your home? (please list) no      Highest level of education completed? 4 year college      Current or past profession: Statistician      Do you exercise?             yes                         Type & how often? Classes- senior retirement community/ some walking      Advanced Directives      Do you have a living will?      Do you have a DNR form?                                  If not, do you want to discuss one?      Do you have signed POA/HPOA for forms?       Functional Status Completed by: Daughter, Kathy Kindle      Do you have difficulty bathing or dressing yourself? no      Do you have difficulty preparing food or eating? no      Do you have difficulty managing your medications? no  Do you have difficulty managing your finances? no      Do you have difficulty affording your medications? no    Review of Systems: ALL ROS discussed and all others negative except listed in HPI.  Physical Exam: Complete physical exam not performed due to the limits inherent in a telehealth encounter.  General: Awake, alert, and oriented, and well communicative. In no acute distress.  HEENT: EOMI, non-icteric sclera, NCAT, MMM  Neck: Normal movement of head and neck  Pulm: No labored breathing, speaking in full sentences without conversational dyspnea  Derm: No apparent lesions or bruising in visible field  MS: Moves all visible extremities without noticeable abnormality  Psych: Pleasant, cooperative, normal speech, normal affect and normal insight Neuro: Alert and appropriate    Merrit Friesen L. Tarri Glenn, MD, MPH [facility] 10/03/2018, 3:51 PM

## 2018-10-03 NOTE — Patient Instructions (Signed)
I am recommending a CT of the abdomen and pelvis to further evaluate your bloating.  Increasing your omeprazole to 40 mg daily may provide some relief.  I also recommend a trial of align, a probiotic that you can purchase at any grocery store or pharmacy.  Please take 1 daily.  If your CT scan does not give [place] a cause for your bloating we will proceed with some breath test that you can do from [name].  I will let you know as results are available.  Please call with any questions or concerns.  Thank you for your patience with me and our technology today!  I look forward to meeting you in person in the future.

## 2018-10-05 ENCOUNTER — Encounter: Payer: Self-pay | Admitting: Gastroenterology

## 2018-10-05 LAB — NOVEL CORONAVIRUS, NAA: SARS-CoV-2, NAA: NOT DETECTED

## 2018-10-06 ENCOUNTER — Ambulatory Visit: Payer: Medicare Other | Admitting: Orthopedic Surgery

## 2018-10-13 ENCOUNTER — Ambulatory Visit: Payer: Medicare Other | Admitting: Orthopedic Surgery

## 2018-10-13 ENCOUNTER — Telehealth: Payer: Self-pay

## 2018-10-13 ENCOUNTER — Encounter: Payer: Self-pay | Admitting: Orthopedic Surgery

## 2018-10-13 ENCOUNTER — Ambulatory Visit: Payer: Self-pay

## 2018-10-13 ENCOUNTER — Ambulatory Visit (INDEPENDENT_AMBULATORY_CARE_PROVIDER_SITE_OTHER): Payer: Medicare Other | Admitting: Orthopedic Surgery

## 2018-10-13 ENCOUNTER — Ambulatory Visit (INDEPENDENT_AMBULATORY_CARE_PROVIDER_SITE_OTHER): Payer: Medicare Other

## 2018-10-13 DIAGNOSIS — G8929 Other chronic pain: Secondary | ICD-10-CM

## 2018-10-13 DIAGNOSIS — M25561 Pain in right knee: Secondary | ICD-10-CM | POA: Diagnosis not present

## 2018-10-13 DIAGNOSIS — M25562 Pain in left knee: Secondary | ICD-10-CM | POA: Diagnosis not present

## 2018-10-13 DIAGNOSIS — M1711 Unilateral primary osteoarthritis, right knee: Secondary | ICD-10-CM

## 2018-10-13 NOTE — Telephone Encounter (Signed)
Can you get patient approved for gel injection? Right knee

## 2018-10-14 DIAGNOSIS — M150 Primary generalized (osteo)arthritis: Secondary | ICD-10-CM | POA: Diagnosis not present

## 2018-10-14 DIAGNOSIS — Z6823 Body mass index (BMI) 23.0-23.9, adult: Secondary | ICD-10-CM | POA: Diagnosis not present

## 2018-10-14 DIAGNOSIS — M5136 Other intervertebral disc degeneration, lumbar region: Secondary | ICD-10-CM | POA: Diagnosis not present

## 2018-10-14 DIAGNOSIS — M0579 Rheumatoid arthritis with rheumatoid factor of multiple sites without organ or systems involvement: Secondary | ICD-10-CM | POA: Diagnosis not present

## 2018-10-15 NOTE — Telephone Encounter (Signed)
Noted  

## 2018-10-16 ENCOUNTER — Telehealth: Payer: Self-pay

## 2018-10-16 DIAGNOSIS — M1711 Unilateral primary osteoarthritis, right knee: Secondary | ICD-10-CM

## 2018-10-16 MED ORDER — BUPIVACAINE HCL 0.25 % IJ SOLN
4.0000 mL | INTRAMUSCULAR | Status: AC | PRN
  Administered 2018-10-16: 21:00:00 4 mL via INTRA_ARTICULAR

## 2018-10-16 MED ORDER — LIDOCAINE HCL 1 % IJ SOLN
5.0000 mL | INTRAMUSCULAR | Status: AC | PRN
  Administered 2018-10-16: 5 mL

## 2018-10-16 MED ORDER — METHYLPREDNISOLONE ACETATE 40 MG/ML IJ SUSP
40.0000 mg | INTRAMUSCULAR | Status: AC | PRN
  Administered 2018-10-16: 21:00:00 40 mg via INTRA_ARTICULAR

## 2018-10-16 NOTE — Progress Notes (Signed)
Office Visit Note   Patient: Kathy Velez           Date of Birth: [birth year]           MRN: [id number] Visit Date: 10/13/2018 Requested by: Gayland Curry, DO [place],  [place] [id number] PCP: Gayland Curry, DO  Subjective: Chief Complaint  Patient presents with  . Right Knee - Pain  . Left Knee - Pain    HPI: Patient presents for evaluation of bilateral knee pain right worse than left.  Denies any history of injury.  States that the knee pain is been going on for a long time.  She states she has a "going out episode" in her right knee at times.  She is on treatment for rheumatoid arthritis.  She also describes decreased leg strength and decreased functional strength for activities of daily living.  Patient does not want surgery.  But she wants to be able to do more.  She states that the pain is preventing activity.  She was previously treated at [facility].  She does have atrial fibrillation and is on blood thinners for that.              ROS: All systems reviewed are negative as they relate to the chief complaint within the history of present illness.  Patient denies  fevers or chills.   Assessment & Plan: Visit Diagnoses:  1. Chronic pain of both knees     Plan: Impression is right knee pain with fairly significant valgus alignment and arthritis present.  She is having some subluxation and popping in that knee consistent with her known diagnosis of arthritis.  No easy answers on this 1.  We talked about therapy for quad strengthening and we reviewed quad strengthening exercises she can do at home.  We can aspirate and inject that knee today and then preapproved for Synvisc.  I will see her back in about 3 or 4 weeks to inject Synvisc in the knee.  This patient is diagnosed with osteoarthritis of the knee(s).    Radiographs show evidence of joint space narrowing, osteophytes, subchondral sclerosis and/or subchondral cysts.  This patient has knee pain which  interferes with functional and activities of daily living.    This patient has experienced inadequate response, adverse effects and/or intolerance with conservative treatments such as acetaminophen, NSAIDS, topical creams, physical therapy or regular exercise, knee bracing and/or weight loss.   This patient has experienced inadequate response or has a contraindication to intra articular steroid injections for at least 3 months.   This patient is not scheduled to have a total knee replacement within 6 months of starting treatment with viscosupplementation.   Follow-Up Instructions: Return if symptoms worsen or fail to improve.   Orders:  Orders Placed This Encounter  Procedures  . XR Knee 1-2 Views Right  . XR Knee 1-2 Views Left   No orders of the defined types were placed in this encounter.     Procedures: Large Joint Inj: R knee on 10/16/2018 8:45 PM Indications: diagnostic evaluation, joint swelling and pain Details: 18 G 1.5 in needle, superolateral approach  Arthrogram: No  Medications: 5 mL lidocaine 1 %; 40 mg methylPREDNISolone acetate 40 MG/ML; 4 mL bupivacaine 0.25 % Outcome: tolerated well, no immediate complications Procedure, treatment alternatives, risks and benefits explained, specific risks discussed. Consent was given by the patient. Immediately prior to procedure a time out was called to verify the correct patient, procedure, equipment, support  staff and site/side marked as required. Patient was prepped and draped in the usual sterile fashion.       Clinical Data: No additional findings.  Objective: Vital Signs: There were no vitals taken for this visit.  Physical Exam:   Constitutional: Patient appears well-developed HEENT:  Head: Normocephalic Eyes:EOM are normal Neck: Normal range of motion Cardiovascular: Normal rate Pulmonary/chest: Effort normal Neurologic: Patient is alert Skin: Skin is warm Psychiatric: Patient has normal mood and affect     Ortho Exam: Ortho exam demonstrates full active and passive range of motion of the left knee.  Right knee has mild effusion with valgus alignment and flexion past 90 degrees.  Collateral crucial ligaments are stable.  Extensor mechanism is intact.  No masses lymphadenopathy or skin changes noted in that right knee or left knee region.  Pedal pulses palpable.  Not much in way of pitting edema in the lower extremities.  Specialty Comments:  No specialty comments available.  Imaging: No results found.   PMFS History: Patient Active Problem List   Diagnosis Date Noted  . Pain in right knee 02/12/2018  . Osteopenia of forearm 12/11/2017  . Bilateral primary osteoarthritis of knee 12/11/2017  . Permanent atrial fibrillation 11/22/2017  . Hypercoagulable state due to atrial fibrillation ([place]) 10/11/2017  . Chronic atrial fibrillation 10/11/2017  . Diplopia 10/11/2017  . Presbycusis of both ears 10/11/2017  . Acquired hammertoes of both feet 10/11/2017  . Macrocytosis without anemia 10/11/2017  . Genu valgum, right 10/11/2017  . History of multiple strokes 10/11/2017  . Mild cognitive impairment with memory loss 10/11/2017  . Overactive bladder 10/11/2017  . Mixed hyperlipidemia 09/03/2017  . History of colon cancer, stage III 01/17/2017  . Carotid stenosis 12/13/2016  . Chronic congestion of paranasal sinus 11/13/2016  . Chronic seasonal allergic rhinitis 06/06/2016  . Interstitial lung disease ([place]) 12/28/2015  . Hemispheric carotid artery syndrome 07/18/2015  . Hypothyroidism 05/05/2015  . Rheumatoid arthritis ([place]) 05/05/2015  . Constipation 05/05/2015  . Vitamin B12 deficiency 05/05/2015  . Iron deficiency anemia 05/02/2015  . Back pain 05/02/2015  . Elective surgery   . Atrial flutter ([place]) 04/13/2015  . Chest pain at rest 04/13/2015  . Status post mitral valve repair 04/13/2015  . Status post tricuspid valve repair 04/13/2015  . Spondylolisthesis of lumbar region  04/12/2015  . Spinal stenosis of lumbar region 02/02/2015  . RLS (restless legs syndrome) 11/10/2013  . HTN (hypertension), benign 07/14/2013  . Depression 07/02/2011   Past Medical History:  Diagnosis Date  . Anemia   . Anxiety   . Arthritis    Back  . Atrial fibrillation ([place])   . Cancer [facility]) 2006   Colon.  Basal Cell Skin cancer- right arm  . Colon cancer ([place])   . Constipation due to pain medication therapy    after heart surgery  . Dysrhythmia    PAF  . GERD (gastroesophageal reflux disease)   . Heart murmur   . Hypertension   . Hypothyroidism   . RA (rheumatoid arthritis) ([place])   . Restless leg   . Seizures ([place])    after Heart Surgey  . Stroke [facility])    TIA- found by neurologist after     Family History  Problem Relation Age of Onset  . Lung disease Mother 66  . Alcohol abuse Son   . Heart disease Father 54    Past Surgical History:  Procedure Laterality Date  . ABDOMINAL HYSTERECTOMY  1970   Partial   .  COLON RESECTION  2006   cancer  . COLONOSCOPY    . EYE SURGERY Bilateral    Cataract  . MAXIMUM ACCESS (MAS)POSTERIOR LUMBAR INTERBODY FUSION (PLIF) 2 LEVEL N/A 04/12/2015   Procedure: Lumbar Three-Five Decompression, Pedicle Screw Fixation, and Posteriolateral Arthrodesis;  Surgeon: Erline Levine, MD;  Location: [facility];  Service: Neurosurgery;  Laterality: N/A;  L3-4 L4-5 Maximum access posterior lumbar fusion, possible interbodies and resection of synovial cyst at L4-5  . MITRAL VALVE REPAIR  01/20/13   Gore-tex cords to P1, P2, and P3. Magic suture to posterior medial commisure, #30 Physio 1 ring. Done in [place]  . TONSILLECTOMY     about 1940  . TRICUSPID VALVE SURGERY  01/20/13   #28 TriAd ring done in [place]   Social History   Occupational History  . Occupation: Automotive engineer    Comment: retired   Tobacco Use  . Smoking status: Never Smoker  . Smokeless tobacco: Never Used  Substance and Sexual Activity  . Alcohol use: Yes     Alcohol/week: 1.0 standard drinks    Types: 1 Glasses of wine per week  . Drug use: No  . Sexual activity: Not Currently

## 2018-10-16 NOTE — Telephone Encounter (Signed)
Submitted VOB for Monovisc, right knee. 

## 2018-10-23 DIAGNOSIS — M0579 Rheumatoid arthritis with rheumatoid factor of multiple sites without organ or systems involvement: Secondary | ICD-10-CM | POA: Diagnosis not present

## 2018-10-23 DIAGNOSIS — Z79899 Other long term (current) drug therapy: Secondary | ICD-10-CM | POA: Diagnosis not present

## 2018-10-24 ENCOUNTER — Encounter: Payer: Self-pay | Admitting: Internal Medicine

## 2018-10-27 ENCOUNTER — Other Ambulatory Visit: Payer: Medicare Other

## 2018-10-27 ENCOUNTER — Ambulatory Visit
Admission: RE | Admit: 2018-10-27 | Discharge: 2018-10-27 | Disposition: A | Discharge status: Home / Self Care | Payer: Medicare Other | Source: Ambulatory Visit | Attending: Gastroenterology | Admitting: Gastroenterology

## 2018-10-27 DIAGNOSIS — R140 Abdominal distension (gaseous): Secondary | ICD-10-CM

## 2018-10-27 DIAGNOSIS — K5730 Diverticulosis of large intestine without perforation or abscess without bleeding: Secondary | ICD-10-CM | POA: Diagnosis not present

## 2018-10-27 MED ORDER — IOPAMIDOL (ISOVUE-300) INJECTION 61%
100.0000 mL | Freq: Once | INTRAVENOUS | Status: AC | PRN
  Administered 2018-10-27: 100 mL via INTRAVENOUS

## 2018-10-30 ENCOUNTER — Telehealth: Payer: Self-pay

## 2018-10-30 NOTE — Telephone Encounter (Signed)
Approved for Monovisc, right knee Buy & Bill Medicare deductible met Secondary insurance will pick up remaining eligible expenses at 100%. No Co-pay No PA required  Appt.11/05/2018 with Dr. Marlou Sa

## 2018-11-04 ENCOUNTER — Other Ambulatory Visit: Payer: Self-pay | Admitting: *Deleted

## 2018-11-04 MED ORDER — LEVOTHYROXINE SODIUM 75 MCG PO TABS: 75.0000 ug | ORAL_TABLET | Freq: Every day | ORAL | 0 refills | Status: DC

## 2018-11-04 NOTE — Telephone Encounter (Signed)
Received refill request from [facility] for Levothyroxine.   Last OV note stated that patient needed TSH. I do not see a recent labdraw.   Pended Rx and sent to Dr. Mariea Clonts for approval.

## 2018-11-05 ENCOUNTER — Ambulatory Visit (INDEPENDENT_AMBULATORY_CARE_PROVIDER_SITE_OTHER): Payer: Medicare Other | Admitting: Orthopedic Surgery

## 2018-11-05 ENCOUNTER — Encounter: Payer: Self-pay | Admitting: Orthopedic Surgery

## 2018-11-05 DIAGNOSIS — M1711 Unilateral primary osteoarthritis, right knee: Secondary | ICD-10-CM

## 2018-11-07 ENCOUNTER — Telehealth: Payer: Self-pay | Admitting: Gastroenterology

## 2018-11-07 ENCOUNTER — Encounter: Payer: Self-pay | Admitting: Orthopedic Surgery

## 2018-11-07 DIAGNOSIS — M1711 Unilateral primary osteoarthritis, right knee: Secondary | ICD-10-CM | POA: Diagnosis not present

## 2018-11-07 MED ORDER — HYALURONAN 88 MG/4ML IX SOSY
88.0000 mg | PREFILLED_SYRINGE | INTRA_ARTICULAR | Status: AC | PRN
  Administered 2018-11-07: 88 mg via INTRA_ARTICULAR

## 2018-11-07 MED ORDER — LIDOCAINE HCL 1 % IJ SOLN
5.0000 mL | INTRAMUSCULAR | Status: AC | PRN
  Administered 2018-11-07: 5 mL

## 2018-11-07 NOTE — Progress Notes (Signed)
   Procedure Note  Patient: Kathy Velez             Date of Birth: Feb 06, 1932           MRN: [id number]             Visit Date: 11/05/2018  Procedures: Visit Diagnoses:  1. Arthritis of right knee     Large Joint Inj: R knee on 11/07/2018 2:10 PM Indications: pain, joint swelling and diagnostic evaluation Details: 18 G 1.5 in needle, superolateral approach  Arthrogram: No  Medications: 5 mL lidocaine 1 %; 88 mg Hyaluronan 88 MG/4ML Outcome: tolerated well, no immediate complications Procedure, treatment alternatives, risks and benefits explained, specific risks discussed. Consent was given by the patient. Immediately prior to procedure a time out was called to verify the correct patient, procedure, equipment, support staff and site/side marked as required. Patient was prepped and draped in the usual sterile fashion.     This patient is diagnosed with osteoarthritis of the knee(s).    Radiographs show evidence of joint space narrowing, osteophytes, subchondral sclerosis and/or subchondral cysts.  This patient has knee pain which interferes with functional and activities of daily living.    This patient has experienced inadequate response, adverse effects and/or intolerance with conservative treatments such as acetaminophen, NSAIDS, topical creams, physical therapy or regular exercise, knee bracing and/or weight loss.   This patient has experienced inadequate response or has a contraindication to intra articular steroid injections for at least 3 months.   This patient is not scheduled to have a total knee replacement within 6 months of starting treatment with viscosupplementation.

## 2018-11-07 NOTE — Telephone Encounter (Signed)
Spoke to the patient who informed this RN that Medicare would not cover the breath test and she could not afford the cost that could be approximately $750. She wants to know what other options she has. Please advise.

## 2018-11-07 NOTE — Telephone Encounter (Signed)
Will try empiric treatment for bacterial overgrowth with Rifamaxin 550 mg TID x 14 days. Thank you.

## 2018-11-10 ENCOUNTER — Other Ambulatory Visit: Payer: Self-pay | Admitting: *Deleted

## 2018-11-10 ENCOUNTER — Telehealth: Payer: Self-pay | Admitting: Gastroenterology

## 2018-11-10 MED ORDER — RIFAXIMIN 550 MG PO TABS: 550.0000 mg | ORAL_TABLET | Freq: Three times a day (TID) | ORAL | 0 refills | Status: AC

## 2018-11-10 MED ORDER — RIFAXIMIN 550 MG PO TABS: 550.0000 mg | ORAL_TABLET | Freq: Three times a day (TID) | ORAL | 0 refills | Status: DC

## 2018-11-10 NOTE — Telephone Encounter (Signed)
Prior auth started on [email or web address]

## 2018-11-10 NOTE — Telephone Encounter (Signed)
Sent Xifaxin 550 mg prescription to the pharmacy on file. Called the patient who reported she did not want the prescription sent to the [facility]), but sent to [facility]. RN called [facility] to stop the prescription from being filled and stop the patient from being billed. Sent to prescription as requested to [facility], too expensive. Resent to [facility].

## 2018-11-11 NOTE — Telephone Encounter (Signed)
Spoke with patient to let her know I have initiated a prior auth on [email or web address] and will let her know as soon as her insurance is finished reviewing it. If her insurance does not approve it I will try and get her some samples. She verbalized understanding. She also had some questions about SIBO symptoms which I answered.

## 2018-11-12 ENCOUNTER — Encounter: Payer: Self-pay | Admitting: Internal Medicine

## 2018-11-12 ENCOUNTER — Non-Acute Institutional Stay: Payer: Medicare Other | Admitting: Internal Medicine

## 2018-11-12 VITALS — BP 120/60 | HR 68 | Temp 97.8°F | Ht 62.0 in | Wt 124.0 lb

## 2018-11-12 DIAGNOSIS — M170 Bilateral primary osteoarthritis of knee: Secondary | ICD-10-CM

## 2018-11-12 DIAGNOSIS — M6208 Separation of muscle (nontraumatic), other site: Secondary | ICD-10-CM | POA: Diagnosis not present

## 2018-11-12 DIAGNOSIS — R140 Abdominal distension (gaseous): Secondary | ICD-10-CM | POA: Diagnosis not present

## 2018-11-12 DIAGNOSIS — G8929 Other chronic pain: Secondary | ICD-10-CM | POA: Diagnosis not present

## 2018-11-12 DIAGNOSIS — M06049 Rheumatoid arthritis without rheumatoid factor, unspecified hand: Secondary | ICD-10-CM

## 2018-11-12 DIAGNOSIS — M545 Low back pain, unspecified: Secondary | ICD-10-CM

## 2018-11-12 DIAGNOSIS — K6389 Other specified diseases of intestine: Secondary | ICD-10-CM

## 2018-11-12 NOTE — Telephone Encounter (Signed)
Spoke with patient she will come by office and pick up samples.

## 2018-11-12 NOTE — Telephone Encounter (Signed)
Left message on patients voicemail to call office. Informed patient Kathy Velez was denied by insurance but we can give her samples.

## 2018-11-12 NOTE — Progress Notes (Signed)
Location:  [facility] clinic Provider:  Jaquala Fuller L. Mariea Clonts, D.O., C.M.D.  Goals of Care:  Advanced Directives 02/19/2018  Does Patient Have a Medical Advance Directive? No  Type of Advance Directive -  Does patient want to make changes to medical advance directive? -  Copy of [place] in Chart? -     Chief Complaint  Patient presents with  . Medical Management of Chronic Issues    8mh follow-up    HPI: Patient is a 85y.o. female seen today for medical management of chronic diseases.    Right knee was injected again.  She had it popping out and it's not doing that now.  She has to push up with her arms to get out of a chair.  Had 300 leg extensions.  She also has some light weights 3 sets of 15 three times a day.  [name] on 2# right now.  With her RA, it's just ongoing.  Wrists hurt.  Back hurts.  She had shots in her back and then surgery that did cure her sciatica.  Prior colon cancer in 2007 with 13 inches of her colon removed.  Saw Dr. BTarri Glennvirtually and liked her about her bloating.  She had a CT abdomen/pelvis that showed only diverticulosis and aortic atherosclerosis.  She is felt to have bacterial overgrowth.  She was prescribed a kit to test that.  Insurance did not approve the kit and it could be up to $750 so Dr. BTarri Glennopted to just treat it.  She prescribed xifaxan for her.  It may not be fully covered either.  She has not gotten it yet.  This all began last week.  She does want me to check on her stomach.  She feels like she has a little ball in her abdomen.  Here she was 124, but her home scale reads 120 which is normal for her.  It's like it's moved to her middle.  She has several soft bms per day.  Colonoscopy with Dr. CHulan Saas11/14/18 for a history of colon cancer showed left sided diverticulosis.   She did have a partial hysterectomy in 1970.  She does still have her ovaries.  Adnexal regions were unremarkable on the CT from 8/3.  She will have dinner  with her daughter for supper for her bday tomorrow.    Some difficulty being awake at night.  She was waking up when taking melatonin at night--she does not really notice much difference.  She also has to get up a couple of times at night to urinate which is ok if she can go back to sleep.  Sometimes, she is so wide awake, she reads a little and goes back to bed.    Past Medical History:  Diagnosis Date  . Anemia   . Anxiety   . Arthritis    Back  . Atrial fibrillation ([place]   . Cancer ([facility] 2006   Colon.  Basal Cell Skin cancer- right arm  . Colon cancer ([place]   . Constipation due to pain medication therapy    after heart surgery  . Dysrhythmia    PAF  . GERD (gastroesophageal reflux disease)   . Heart murmur   . Hypertension   . Hypothyroidism   . RA (rheumatoid arthritis) ([place]   . Restless leg   . Seizures ([place]    after Heart Surge[facility]  . Stroke ([facility]    TIA- found by neurologist after     Past Surgical History:  Procedure Laterality Date  . ABDOMINAL HYSTERECTOMY  1970   Partial   . COLON RESECTION  2006   cancer  . COLONOSCOPY    . EYE SURGERY Bilateral    Cataract  . MAXIMUM ACCESS (MAS)POSTERIOR LUMBAR INTERBODY FUSION (PLIF) 2 LEVEL N/A 04/12/2015   Procedure: Lumbar Three-Five Decompression, Pedicle Screw Fixation, and Posteriolateral Arthrodesis;  Surgeon: Erline Levine, MD;  Location: [place] [facility];  Service: Neurosurgery;  Laterality: N/A;  L3-4 L4-5 Maximum access posterior lumbar fusion, possible interbodies and resection of synovial cyst at L4-5  . MITRAL VALVE REPAIR  01/20/13   Gore-tex cords to P1, P2, and P3. Magic suture to posterior medial commisure, #30 Physio 1 ring. Done in [place]  . TONSILLECTOMY     about 1940  . TRICUSPID VALVE SURGERY  01/20/13   #28 TriAd ring done in [place]    Allergies  Allergen Reactions  . Codeine Other (See Comments)    "just don't take it well"  . Molds & Smuts   . Pollen Extract Swelling  . Bee Venom Swelling  and Rash    Outpatient Encounter Medications as of 11/12/2018  Medication Sig  . acetaminophen (TYLENOL) 500 MG tablet Take 1,000 mg by mouth every 6 (six) hours as needed for moderate pain.  Marland Kitchen apixaban (ELIQUIS) 2.5 MG TABS tablet Take 1 tablet (2.5 mg total) by mouth 2 (two) times daily.  Marland Kitchen aspirin EC 81 MG tablet Take 81 mg by mouth daily.  Marland Kitchen atorvastatin (LIPITOR) 20 MG tablet Take 1 tablet (20 mg total) by mouth daily.  . Azelastine-Fluticasone 137-50 MCG/ACT SUSP Place 1 spray into the nose daily as needed (allergies).   . Biotin 5000 MCG CAPS Take 1 capsule by mouth daily.  . cetirizine (ZYRTEC) 10 MG tablet Take 1 tablet (10 mg total) by mouth daily.  . cholecalciferol (VITAMIN D) 1000 units tablet Take 1,000 Units by mouth daily.  . digoxin (LANOXIN) 0.125 MG tablet Take 1 tablet (0.125 mg total) by mouth daily.  [name] SURECLICK 50 MG/ML injection   . levothyroxine (SYNTHROID) 75 MCG tablet Take 1 tablet (75 mcg total) by mouth daily before breakfast. For Hypothyroidism  . magnesium oxide (MAG-OX) 400 MG tablet Take 400 mg by mouth daily.  . metoprolol succinate (TOPROL-XL) 50 MG 24 hr tablet Take 1 tablet (50 mg total) by mouth daily.  . mirtazapine (REMERON) 7.5 MG tablet Take 1 tablet (7.5 mg total) by mouth at bedtime.  . Multiple Vitamins-Minerals (PRESERVISION AREDS 2) CAPS Take 1 Dose by mouth 2 (two) times daily. For eye health  . omeprazole (PRILOSEC) 40 MG capsule Take 1 capsule (40 mg total) by mouth daily.  . polycarbophil (FIBERCON) 625 MG tablet Take 625 mg by mouth daily.  . Probiotic Product (ALIGN) 4 MG CAPS Take 1 capsule by mouth daily.  . rifaximin (XIFAXAN) 550 MG TABS tablet Take 1 tablet (550 mg total) by mouth 3 (three) times daily for 14 days.  . valsartan (DIOVAN) 80 MG tablet Take 80 mg by mouth daily.  . vitamin B-12 (CYANOCOBALAMIN) 1000 MCG tablet Take 1,000 mcg by mouth daily.    No facility-administered encounter medications on file as of  11/12/2018.     Review of Systems:  Review of Systems  Constitutional: Negative for chills, fever and weight loss.  HENT: Positive for hearing loss. Negative for congestion and sore throat.   Eyes: Negative for blurred vision.  Respiratory: Negative for cough and shortness of breath.   Cardiovascular: Negative  for chest pain, palpitations and leg swelling.  Gastrointestinal: Negative for abdominal pain, blood in stool, constipation, diarrhea, heartburn, melena, nausea and vomiting.       Abdominal bloating; frequent loose bms, but not diarrhea  Genitourinary: Negative for dysuria.  Musculoskeletal: Positive for back pain and joint pain. Negative for falls.  Skin: Negative for itching and rash.  Neurological: Positive for tingling and sensory change. Negative for dizziness and loss of consciousness.  Psychiatric/Behavioral: Positive for memory loss. Negative for depression. The patient has insomnia. The patient is not nervous/anxious.     Health Maintenance  Topic Date Due  . INFLUENZA VACCINE  10/25/2018  . TETANUS/TDAP  04/16/2027  . DEXA SCAN  Completed  . PNA vac Low Risk Adult  Completed    Physical Exam: Vitals:   11/12/18 0852  BP: 120/60  Pulse: 68  Temp: 97.8 F (36.6 C)  TempSrc: Oral  SpO2: 97%  Weight: 124 lb (56.2 kg)  Height: '5\' 2"'  (1.575 m)   Body mass index is 22.68 kg/m. Physical Exam Vitals signs reviewed.  Constitutional:      General: She is not in acute distress.    Appearance: Normal appearance. She is normal weight. She is not ill-appearing or toxic-appearing.  HENT:     Head: Normocephalic and atraumatic.  Eyes:     Pupils: Pupils are equal, round, and reactive to light.  Cardiovascular:     Rate and Rhythm: Rhythm irregular.     Heart sounds: No murmur.  Pulmonary:     Effort: Pulmonary effort is normal.     Breath sounds: Normal breath sounds.  Abdominal:     General: Bowel sounds are normal.     Palpations: There is no mass.      Tenderness: There is no abdominal tenderness. There is no right CVA tenderness, left CVA tenderness, guarding or rebound.     Hernia: No hernia is present.     Comments: Notable central prominence of abdomen when goes to sit up from lying position--softer centrally; no tenderness; no ascites  Musculoskeletal:     Comments: Right ankle and knee deformity; ambulates slowly and pauses upon standing to balance before walking  Skin:    General: Skin is warm and dry.     Capillary Refill: Capillary refill takes less than 2 seconds.  Neurological:     General: No focal deficit present.     Mental Status: She is alert and oriented to person, place, and time. Mental status is at baseline.     Cranial Nerves: No cranial nerve deficit.     Gait: Gait abnormal.  Psychiatric:        Mood and Affect: Mood normal.        Behavior: Behavior normal.     Labs reviewed: Basic Metabolic Panel: Recent Labs    05/08/18 0500 07/16/18  NA 136* 134*  K 5.4* 4.8  BUN 21 17  CREATININE 0.8 0.7   Liver Function Tests: Recent Labs    05/08/18 0500 07/16/18  AST 25 18  ALT 20 17  ALKPHOS 88 89   No results for input(s): LIPASE, AMYLASE in the last 8760 hours. No results for input(s): AMMONIA in the last 8760 hours. CBC: Recent Labs    05/08/18 0500 07/16/18  WBC 8.9 9.8  NEUTROABS  --  5  HGB 12.0 11.7*  HCT 35* 33*  PLT 188 163   Lipid Panel: No results for input(s): CHOL, HDL, LDLCALC, TRIG, CHOLHDL, LDLDIRECT in the last  8760 hours. Lab Results  Component Value Date   HGBA1C 5.5 01/12/2013    Procedures since last visit: Ct Abdomen Pelvis W Contrast  Result Date: 10/27/2018 CLINICAL DATA:  Abdominal distension and weight gain of 5 lb in 1 year. Personal history of colon carcinoma. EXAM: CT ABDOMEN AND PELVIS WITH CONTRAST TECHNIQUE: Multidetector CT imaging of the abdomen and pelvis was performed using the standard protocol following bolus administration of intravenous contrast.  CONTRAST:  137m ISOVUE-300 IOPAMIDOL (ISOVUE-300) INJECTION 61% COMPARISON:  None. FINDINGS: Lower Chest: No acute findings. Hepatobiliary: No hepatic masses identified. Gallbladder is unremarkable. Pancreas:  No mass or inflammatory changes. Spleen: Within normal limits in size and appearance. Adrenals/Urinary Tract: No masses identified. Tiny sub-cm right renal cyst noted. No evidence of hydronephrosis. Stomach/Bowel: No evidence of obstruction, inflammatory process or abnormal fluid collections. Postop changes from previous right colectomy. Diverticulosis is seen mainly involving the sigmoid colon, however there is no evidence of diverticulitis. Vascular/Lymphatic: No pathologically enlarged lymph nodes. No abdominal aortic aneurysm. Aortic atherosclerosis. Reproductive: Prior hysterectomy noted. Adnexal regions are unremarkable in appearance. Other:  None. Musculoskeletal:  No suspicious bone lesions identified. IMPRESSION: Colonic diverticulosis. No radiographic evidence of diverticulitis or other acute findings. Aortic Atherosclerosis (ICD10-I70.0). Electronically Signed   By: JMarlaine HindM.D.   On: 10/27/2018 18:42   Xr Knee 1-2 Views Left  Result Date: 10/16/2018 AP lateral left knee reviewed.  Alignment normal.  No significant arthritis is present.  Bone density reasonable for patient her age no acute fracture.  Xr Knee 1-2 Views Right  Result Date: 10/16/2018 AP lateral right knee reviewed.  End-stage lateral compartment arthritis is present along with valgus alignment.  No acute fracture or dislocation noted.  Medial compartment appears to be spared.   Assessment/Plan 1. Diastasis recti -appears she has this condition due to weakening of muscles and fascia with age, prior pregnancies, prior right colon resection -counseled about being careful lifting items due to this (along with her back problems and arthritis) -best managed by preventing weight gain (worsens and can become ventral  hernia), abdominal strengthening   2. Abdominal bloating -may be multifactorial -her bowel resection for colon ca puts her at increased risk for #3 as suspected by GI -she is to get xifaxan for this but it was still going through her insurance so she's not been started on it yet   3. Bacterial overgrowth syndrome -take xifaxan as directed  4. Rheumatoid arthritis involving hand with negative rheumatoid factor, unspecified laterality ([place] -continue enbrel through rheum, also regular exercise, tylenol  5. Bilateral primary osteoarthritis of knee -ongoing and has some deformity due to her prior ankle injury -doing better since beginning regular quad strengthening exercises--continue these as directed by PT  6. Chronic bilateral low back pain without sciatica -recommended basic early am stretches along with her tylenol   Labs/tests ordered:  Cbc with diff, cmp, flp, b12 before next visit Next appt:  02/11/2019  Alta Goding L. Roberta Kelly, D.O. [facility] [address] [id number] Phone (Mon-Fri 8am-5pm):  [phone] Call:  [phone]& follow prompts after 5pm & weekends Office Phone:  [phone] Fax:  [phone]

## 2018-11-12 NOTE — Patient Instructions (Addendum)
Please continue your exercise program for your knees. Also, try to resume some of your low back stretches.  They may help make your back to feel better day to day.      Diastasis Recti  Diastasis recti is when the muscles of the abdomen (rectus abdominis muscles) become thin and separate. The result is a wider space between the right and left abdomen (abdominal) muscles. This wider space between the muscles may cause a bulge in the middle of your abdomen. You may notice this bulge when you are straining or when you sit up from a lying down position. Diastasis recti can affect men and women. It is most common among pregnant women, infants, people who are obese, and people who have had abdominal surgery. Exercise or surgical treatment may help correct it. What are the causes? Common causes of this condition include:  Pregnancy. The growing uterus puts pressure on the abdominal muscles, which causes the muscles to separate.  Obesity. Excess fat puts pressure on abdominal muscles.  Weightlifting.  Some abdomen exercises.  Advanced age.  Genetics.  Prior abdominal surgery. What increases the risk? This condition is more likely to develop in:  Women.  Newborns, especially newborns who are born early (prematurely). What are the signs or symptoms? Common symptoms of this condition include:  A bulge in the middle of the abdomen. You will notice it most when you sit up or strain.  Pain in the low back, pelvis, or hips.  Constipation.  Inability to control when you urinate (urinary incontinence).  Bloating.  Poor posture. How is this diagnosed? This condition is diagnosed with a physical exam. Your health care provider will ask you to lie flat on your back and do a crunch or half sit-up. If you have diastasis recti, a vertical bulge will appear between your abdominal muscles in the center of your abdomen. Your health care provider will measure the gap between your muscles with one of  the following:  A medical device used to measure the space between two objects (caliper).  A tape measure.  CT scan.  Ultrasound.  Finger spaces. Your health care provider will measure the space using their fingers. How is this treated? If your muscle separation is not too large, you may not need treatment. However, if you are a woman who plans to become pregnant again, you should treat this condition before your next pregnancy. Treatment may include:  Physical therapy to strengthen and tighten your abdominal muscles.  Lifestyle changes such as weight loss and exercise.  Over-the-counter pain medicines as needed.  Surgery to correct the separation. Follow these instructions at home: Activity  Return to your normal activities as told by your health care provider. Ask your health care provider what activities are safe for you.  When lifting weights or doing exercises using your abdominal muscles or the muscles in the center of your body that give stability (core muscles), make sure you are doing your exercises and movements correctly. Proper form can help to prevent the condition from happening again. General instructions  If you are overweight, ask your health care provider for help with weight loss. Losing even a small amount of weight can help to improve your diastasis recti.  Take over-the-counter or prescription medicines only as told by your health care provider.  Do not strain. Straining can make the separation worse. Examples of straining include: ? Pushing hard to have a bowel movement, such as due to constipation. ? Lifting heavy objects, including children. ?  Standing up and sitting down.  Take steps to prevent constipation: ? Drink enough fluid to keep your urine clear or pale yellow. ? Take over-the-counter or prescription medicines only as directed. ? Eat foods that are high in fiber, such as fresh fruits and vegetables, whole grains, and beans. ? Limit foods  that are high in fat and processed sugars, such as fried and sweet foods. Contact a health care provider if:  You notice a new bulge in your abdomen. Get help right away if:  You experience severe discomfort in your abdomen.  You develop severe abdominal pain along with nausea, vomiting, or fever. Summary  Diastasis recti is when the abdomen (abdominal) muscles become thin and separate. Your abdomen will stick out because the space between your right and left abdomen muscles has widened.  The most common symptom is a bulge in your abdomen. You will notice it most when you sit up or are straining.  This condition is diagnosed during a physical exam.  If the abdomen separation is not too big, you may choose not to have treatment. Otherwise, you may need to undergo physical therapy or surgery. This information is not intended to replace advice given to you by your health care provider. Make sure you discuss any questions you have with your health care provider. Document Released: 05/07/2016 Document Revised: 02/22/2017 Document Reviewed: 05/07/2016 Elsevier Patient Education  2020 [employer].

## 2018-11-14 ENCOUNTER — Other Ambulatory Visit: Payer: Self-pay | Admitting: Internal Medicine

## 2018-11-19 ENCOUNTER — Other Ambulatory Visit: Payer: Self-pay | Admitting: Internal Medicine

## 2018-11-19 DIAGNOSIS — I482 Chronic atrial fibrillation, unspecified: Secondary | ICD-10-CM

## 2018-11-26 ENCOUNTER — Encounter: Payer: Self-pay | Admitting: Internal Medicine

## 2018-11-27 ENCOUNTER — Other Ambulatory Visit: Payer: Self-pay

## 2018-11-27 ENCOUNTER — Ambulatory Visit (HOSPITAL_COMMUNITY): Payer: Medicare Other | Attending: Internal Medicine

## 2018-11-27 DIAGNOSIS — I10 Essential (primary) hypertension: Secondary | ICD-10-CM | POA: Diagnosis not present

## 2018-11-27 DIAGNOSIS — I4891 Unspecified atrial fibrillation: Secondary | ICD-10-CM | POA: Insufficient documentation

## 2018-11-27 DIAGNOSIS — Z952 Presence of prosthetic heart valve: Secondary | ICD-10-CM

## 2018-11-27 DIAGNOSIS — Z9889 Other specified postprocedural states: Secondary | ICD-10-CM | POA: Diagnosis not present

## 2018-11-27 DIAGNOSIS — I083 Combined rheumatic disorders of mitral, aortic and tricuspid valves: Secondary | ICD-10-CM | POA: Diagnosis not present

## 2018-11-27 DIAGNOSIS — E039 Hypothyroidism, unspecified: Secondary | ICD-10-CM | POA: Insufficient documentation

## 2018-11-27 DIAGNOSIS — M069 Rheumatoid arthritis, unspecified: Secondary | ICD-10-CM | POA: Insufficient documentation

## 2018-11-27 DIAGNOSIS — Z8673 Personal history of transient ischemic attack (TIA), and cerebral infarction without residual deficits: Secondary | ICD-10-CM | POA: Diagnosis not present

## 2018-12-08 DIAGNOSIS — H353131 Nonexudative age-related macular degeneration, bilateral, early dry stage: Secondary | ICD-10-CM | POA: Diagnosis not present

## 2018-12-08 DIAGNOSIS — Z961 Presence of intraocular lens: Secondary | ICD-10-CM | POA: Diagnosis not present

## 2018-12-23 DIAGNOSIS — M150 Primary generalized (osteo)arthritis: Secondary | ICD-10-CM | POA: Diagnosis not present

## 2018-12-23 DIAGNOSIS — Z6823 Body mass index (BMI) 23.0-23.9, adult: Secondary | ICD-10-CM | POA: Diagnosis not present

## 2018-12-23 DIAGNOSIS — M0579 Rheumatoid arthritis with rheumatoid factor of multiple sites without organ or systems involvement: Secondary | ICD-10-CM | POA: Diagnosis not present

## 2018-12-23 DIAGNOSIS — M5136 Other intervertebral disc degeneration, lumbar region: Secondary | ICD-10-CM | POA: Diagnosis not present

## 2019-01-05 ENCOUNTER — Ambulatory Visit (INDEPENDENT_AMBULATORY_CARE_PROVIDER_SITE_OTHER): Payer: Medicare Other

## 2019-01-05 ENCOUNTER — Other Ambulatory Visit: Payer: Self-pay

## 2019-01-05 DIAGNOSIS — Z23 Encounter for immunization: Secondary | ICD-10-CM | POA: Diagnosis not present

## 2019-01-22 ENCOUNTER — Telehealth: Payer: Self-pay

## 2019-01-22 ENCOUNTER — Encounter: Payer: Medicare Other | Admitting: Family

## 2019-01-22 NOTE — Telephone Encounter (Signed)
Patient is updating her paperwork at [facility] and was asked if she should be taking Valsartan and Losartan.  I see Valsartan on her med list from August 2020 but not Losartan. I was unable to find Losartan in the medication history. The Valsartan appears to be a long-term medication.  She stated she had a bottle of Losartan that said it replaced Valsartan, but was unable to locate a date on the bottle.

## 2019-01-22 NOTE — Telephone Encounter (Signed)
Losartan Valsartan

## 2019-01-22 NOTE — Telephone Encounter (Signed)
As far as I know, she's on valsartan.  We could confirm with her pharmacy what she's been given when.  (Her memory is not 100% and her daughter is involved some in her health care as a result.)

## 2019-01-23 NOTE — Telephone Encounter (Signed)
[facility] has no record of patient being on Valsartan or Losartan.  Have been unable to reach [facility] yet to verify medications, and was not able to LM when daughter called.

## 2019-01-26 NOTE — Telephone Encounter (Signed)
Have been unable to get to the correct location at [facility] to see what medication patient may be taking.  Have tried to contact daughter twice without success.  Was not able to leave a VM.  Will continue to ES and daughter.

## 2019-01-27 ENCOUNTER — Other Ambulatory Visit: Payer: Self-pay | Admitting: Internal Medicine

## 2019-01-29 ENCOUNTER — Telehealth: Payer: Self-pay

## 2019-01-29 NOTE — Telephone Encounter (Signed)
Patient states she has a card showing her next apnt and a note written by Dr. Mariea Clonts to have labs done but she doesn't have an apnt scheduled. Should she schedule a lab apnt before her follow up on the 18th?  According to last note she should have labs before next visit. She is a [facility] patient. Please get her scheduled. Routing to S. Atkins as I am unsure of [name] lab process.

## 2019-02-02 ENCOUNTER — Encounter: Payer: Self-pay | Admitting: Family

## 2019-02-02 ENCOUNTER — Other Ambulatory Visit: Payer: Self-pay

## 2019-02-02 ENCOUNTER — Ambulatory Visit (INDEPENDENT_AMBULATORY_CARE_PROVIDER_SITE_OTHER): Payer: Medicare Other | Admitting: Family

## 2019-02-02 ENCOUNTER — Telehealth: Payer: Self-pay | Admitting: *Deleted

## 2019-02-02 DIAGNOSIS — Z0000 Encounter for general adult medical examination without abnormal findings: Secondary | ICD-10-CM

## 2019-02-02 NOTE — Telephone Encounter (Signed)
Mliss Sax with [name] called and stated that patient had a AWV today with Dinah. Patient is complaining of Partial loss of vision to her Left eye for a couple of hours. Vision has come back. Patient has had a history of TIA's in the past. Patient also complaining of losing memory of names and words. Mliss Sax is wondering if patient needs a Neurologist Referral. Please Advise.   Next appointment 02/11/2019

## 2019-02-02 NOTE — Patient Instructions (Signed)
Kathy Velez , Thank you for taking time to come for your Medicare Wellness Visit. I appreciate your ongoing commitment to your health goals. Please review the following plan we discussed and let me know if I can assist you in the future.   Screening recommendations/referrals: Colonoscopy: N/A Mammogram: N/A Bone Density: Up to date  Recommended yearly ophthalmology/optometry visit for glaucoma screening and checkup Recommended yearly dental visit for hygiene and checkup  Vaccinations: Influenza vaccine: Up to date  Pneumococcal vacci: Up to date Tdap vaccine: Up to date  Shingles vaccine : Up to date   Advanced directives: Yes   Conditions/risks identified: Advance Age Female > 83 years old,Hypertension,dyslipidemia   Next appointment: 1 year    Preventive Care 83 Years and Older, Female Preventive care refers to lifestyle choices and visits with your health care provider that can promote health and wellness. What does preventive care include?  A yearly physical exam. This is also called an annual well check.  Dental exams once or twice a year.  Routine eye exams. Ask your health care provider how often you should have your eyes checked.  Personal lifestyle choices, including:  Daily care of your teeth and gums.  Regular physical activity.  Eating a healthy diet.  Avoiding tobacco and drug use.  Limiting alcohol use.  Practicing safe sex.  Taking low-dose aspirin every day.  Taking vitamin and mineral supplements as recommended by your health care provider. What happens during an annual well check? The services and screenings done by your health care provider during your annual well check will depend on your age, overall health, lifestyle risk factors, and family history of disease. Counseling  Your health care provider may ask you questions about your:  Alcohol use.  Tobacco use.  Drug use.  Emotional well-being.  Home and relationship well-being.   Sexual activity.  Eating habits.  History of falls.  Memory and ability to understand (cognition).  Work and work Statistician.  Reproductive health. Screening  You may have the following tests or measurements:  Height, weight, and BMI.  Blood pressure.  Lipid and cholesterol levels. These may be checked every 5 years, or more frequently if you are over 33 years old.  Skin check.  Lung cancer screening. You may have this screening every year starting at age 18 if you have a 30-pack-year history of smoking and currently smoke or have quit within the past 15 years.  Fecal occult blood test (FOBT) of the stool. You may have this test every year starting at age 17.  Flexible sigmoidoscopy or colonoscopy. You may have a sigmoidoscopy every 5 years or a colonoscopy every 10 years starting at age 54.  Hepatitis C blood test.  Hepatitis B blood test.  Sexually transmitted disease (STD) testing.  Diabetes screening. This is done by checking your blood sugar (glucose) after you have not eaten for a while (fasting). You may have this done every 1-3 years.  Bone density scan. This is done to screen for osteoporosis. You may have this done starting at age 39.  Mammogram. This may be done every 1-2 years. Talk to your health care provider about how often you should have regular mammograms. Talk with your health care provider about your test results, treatment options, and if necessary, the need for more tests. Vaccines  Your health care provider may recommend certain vaccines, such as:  Influenza vaccine. This is recommended every year.  Tetanus, diphtheria, and acellular pertussis (Tdap, Td) vaccine. You may need  a Td booster every 10 years.  Zoster vaccine. You may need this after age 61.  Pneumococcal 13-valent conjugate (PCV13) vaccine. One dose is recommended after age 83.  Pneumococcal polysaccharide (PPSV23) vaccine. One dose is recommended after age 33. Talk to your  health care provider about which screenings and vaccines you need and how often you need them. This information is not intended to replace advice given to you by your health care provider. Make sure you discuss any questions you have with your health care provider. Document Released: 04/08/2015 Document Revised: 11/30/2015 Document Reviewed: 01/11/2015 Elsevier Interactive Patient Education  2017 [place] Prevention in the Home Falls can cause injuries. They can happen to people of all ages. There are many things you can do to make your home safe and to help prevent falls. What can I do on the outside of my home?  Regularly fix the edges of walkways and driveways and fix any cracks.  Remove anything that might make you trip as you walk through a door, such as a raised step or threshold.  Trim any bushes or trees on the path to your home.  Use bright outdoor lighting.  Clear any walking paths of anything that might make someone trip, such as rocks or tools.  Regularly check to see if handrails are loose or broken. Make sure that both sides of any steps have handrails.  Any raised decks and porches should have guardrails on the edges.  Have any leaves, snow, or ice cleared regularly.  Use sand or salt on walking paths during winter.  Clean up any spills in your garage right away. This includes oil or grease spills. What can I do in the bathroom?  Use night lights.  Install grab bars by the toilet and in the tub and shower. Do not use towel bars as grab bars.  Use non-skid mats or decals in the tub or shower.  If you need to sit down in the shower, use a plastic, non-slip stool.  Keep the floor dry. Clean up any water that spills on the floor as soon as it happens.  Remove soap buildup in the tub or shower regularly.  Attach bath mats securely with double-sided non-slip rug tape.  Do not have throw rugs and other things on the floor that can make you trip. What  can I do in the bedroom?  Use night lights.  Make sure that you have a light by your bed that is easy to reach.  Do not use any sheets or blankets that are too big for your bed. They should not hang down onto the floor.  Have a firm chair that has side arms. You can use this for support while you get dressed.  Do not have throw rugs and other things on the floor that can make you trip. What can I do in the kitchen?  Clean up any spills right away.  Avoid walking on wet floors.  Keep items that you use a lot in easy-to-reach places.  If you need to reach something above you, use a strong step stool that has a grab bar.  Keep electrical cords out of the way.  Do not use floor polish or wax that makes floors slippery. If you must use wax, use non-skid floor wax.  Do not have throw rugs and other things on the floor that can make you trip. What can I do with my stairs?  Do not leave any items on the  stairs.  Make sure that there are handrails on both sides of the stairs and use them. Fix handrails that are broken or loose. Make sure that handrails are as long as the stairways.  Check any carpeting to make sure that it is firmly attached to the stairs. Fix any carpet that is loose or worn.  Avoid having throw rugs at the top or bottom of the stairs. If you do have throw rugs, attach them to the floor with carpet tape.  Make sure that you have a light switch at the top of the stairs and the bottom of the stairs. If you do not have them, ask someone to add them for you. What else can I do to help prevent falls?  Wear shoes that:  Do not have high heels.  Have rubber bottoms.  Are comfortable and fit you well.  Are closed at the toe. Do not wear sandals.  If you use a stepladder:  Make sure that it is fully opened. Do not climb a closed stepladder.  Make sure that both sides of the stepladder are locked into place.  Ask someone to hold it for you, if possible.   Clearly mark and make sure that you can see:  Any grab bars or handrails.  First and last steps.  Where the edge of each step is.  Use tools that help you move around (mobility aids) if they are needed. These include:  Canes.  Walkers.  Scooters.  Crutches.  Turn on the lights when you go into a dark area. Replace any light bulbs as soon as they burn out.  Set up your furniture so you have a clear path. Avoid moving your furniture around.  If any of your floors are uneven, fix them.  If there are any pets around you, be aware of where they are.  Review your medicines with your doctor. Some medicines can make you feel dizzy. This can increase your chance of falling. Ask your doctor what other things that you can do to help prevent falls. This information is not intended to replace advice given to you by your health care provider. Make sure you discuss any questions you have with your health care provider. Document Released: 01/06/2009 Document Revised: 08/18/2015 Document Reviewed: 04/16/2014 Elsevier Interactive Patient Education  2017 [employer].

## 2019-02-02 NOTE — Progress Notes (Signed)
Subjective:   Kathy Velez is a 85 y.o. female who presents for Medicare Annual (Subsequent) preventive examination.  Review of Systems:   Cardiac Risk Factors include: hypertension;dyslipidemia;advanced age (>6men, >28 women)     Objective:     Vitals: There were no vitals taken for this visit.  There is no height or weight on file to calculate BMI.  Advanced Directives 02/19/2018 01/28/2018 10/09/2017 05/02/2015 04/12/2015 04/11/2015  Does Patient Have a Medical Advance Directive? No Yes Yes No Yes Yes  Type of Advance Directive - [place];Living will Healthcare Power of [place];Living will  Does patient want to make changes to medical advance directive? - No - Patient declined No - Patient declined - - -  Copy of [place] in Chart? - Yes Yes Yes No - copy requested Yes    Tobacco Social History   Tobacco Use  Smoking Status Never Smoker  Smokeless Tobacco Never Used     Counseling given: Not Answered   Clinical Intake:  Pre-visit preparation completed: No  Pain Score: 7  Pain Type: Chronic pain Pain Location: Generalized Pain Descriptors / Indicators: Aching Pain Onset: Other (comment)(about 10 years) Pain Frequency: Constant Pain Relieving Factors: Tylenol Effect of Pain on Daily Activities: No  Pain Relieving Factors: Tylenol  BMI - recorded: 22.68 Nutritional Status: BMI of 19-24  Normal Nutritional Risks: None Diabetes: No  How often do you need to have someone help you when you read instructions, pamphlets, or other written materials from your doctor or pharmacy?: 1 - Never What is the last grade level you completed in school?: College  Interpreter Needed?: No  Information entered by ::   FNP-C  Past Medical History:  Diagnosis Date  . Anemia   . Anxiety   . Arthritis    Back  . Atrial fibrillation ([place])   . Cancer [facility]) 2006   Colon.   Basal Cell Skin cancer- right arm  . Colon cancer ([place])   . Constipation due to pain medication therapy    after heart surgery  . Dysrhythmia    PAF  . GERD (gastroesophageal reflux disease)   . Heart murmur   . Hypertension   . Hypothyroidism   . RA (rheumatoid arthritis) ([place])   . Restless leg   . Seizures ([place])    after Heart Surgey  . Stroke [facility])    TIA- found by neurologist after    Past Surgical History:  Procedure Laterality Date  . ABDOMINAL HYSTERECTOMY  1970   Partial   . COLON RESECTION  2006   cancer  . COLONOSCOPY    . EYE SURGERY Bilateral    Cataract  . MAXIMUM ACCESS (MAS)POSTERIOR LUMBAR INTERBODY FUSION (PLIF) 2 LEVEL N/A 04/12/2015   Procedure: Lumbar Three-Five Decompression, Pedicle Screw Fixation, and Posteriolateral Arthrodesis;  Surgeon: Erline Levine, MD;  Location: [place] [facility];  Service: Neurosurgery;  Laterality: N/A;  L3-4 L4-5 Maximum access posterior lumbar fusion, possible interbodies and resection of synovial cyst at L4-5  . MITRAL VALVE REPAIR  01/20/13   Gore-tex cords to P1, P2, and P3. Magic suture to posterior medial commisure, #30 Physio 1 ring. Done in [place]  . TONSILLECTOMY     about 1940  . TRICUSPID VALVE SURGERY  01/20/13   #28 TriAd ring done in [place]   Family History  Problem Relation Age of Onset  . Lung disease Mother 54  . Alcohol abuse Son   .  Heart disease Father 52   Social History   Socioeconomic History  . Marital status: Widowed    Spouse name: Not on file  . Number of children: Not on file  . Years of education: Not on file  . Highest education level: Not on file  Occupational History  . 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 Financial resource strain: Not hard at all  . Food insecurity    Worry: Never true    Inability: Never true  . Transportation needs    Medical: No    Non-medical: No  Tobacco Use  . Smoking status: Never Smoker  . Smokeless tobacco: Never  Used  Substance and Sexual Activity  . Alcohol use: Yes    Alcohol/week: 1.0 standard drinks    Types: 1 Glasses of wine per week  . Drug use: No  . Sexual activity: Not Currently  Lifestyle  . Physical activity    Days per week: 6 days    Minutes per session: 30 min  . Stress: Only a little  Relationships  . Social connections    Talks on phone: More than three times a week    Gets together: More than three times a week    Attends religious service: Never    Active member of club or organization: No    Attends meetings of clubs or organizations: Never    Relationship status: Widowed  Other Topics Concern  . Not on file  Social History Narrative   Social History      Diet? Healthy- low salt, sugar, fat      Do you drink/eat things with caffeine? On occasion      Marital status?        widow                        What year were you married? 1958      Do you live in a house, apartment, assisted living, condo, trailer, etc.? apartment      Is it one or more stories? one      How many persons live in your home? one      Do you have any pets in your home? (please list) no      Highest level of education completed? 4 year college      Current or past profession: Statistician      Do you exercise?             yes                         Type & how often? Classes- senior retirement community/ some walking      Advanced Directives      Do you have a living will?      Do you have a DNR form?                                  If not, do you want to discuss one?      Do you have signed POA/HPOA for forms?       Functional Status Completed by: Daughter, Di Kindle      Do you have difficulty bathing or dressing yourself? no      Do you have difficulty preparing food or eating? no  Do you have difficulty managing your medications? no      Do you have difficulty managing your finances? no      Do you have difficulty affording your medications? no     Outpatient Encounter Medications as of 02/02/2019  Medication Sig  . acetaminophen (TYLENOL) 500 MG tablet Take 1,000 mg by mouth every 6 (six) hours as needed for moderate pain.  Marland Kitchen aspirin EC 81 MG tablet Take 81 mg by mouth daily.  Marland Kitchen atorvastatin (LIPITOR) 20 MG tablet TAKE 1 TABLET DAILY  . Azelastine-Fluticasone 137-50 MCG/ACT SUSP Place 1 spray into the nose daily as needed (allergies).   . Biotin 5000 MCG CAPS Take 1 capsule by mouth daily.  . cholecalciferol (VITAMIN D) 1000 units tablet Take 1,000 Units by mouth daily.  . digoxin (LANOXIN) 0.125 MG tablet TAKE 1 TABLET DAILY  . ELIQUIS 2.5 MG TABS tablet TAKE 1 TABLET TWICE A DAY  . ENBREL SURECLICK 50 MG/ML injection   . levothyroxine (SYNTHROID) 75 MCG tablet TAKE 1 TABLET DAILY BEFORE BREAKFAST FOR HYPOTHYROIDISM  . magnesium oxide (MAG-OX) 400 MG tablet Take 400 mg by mouth daily.  . Melatonin 5 MG TABS Take 1 tablet by mouth at bedtime as needed.  . mirtazapine (REMERON) 7.5 MG tablet Take 1 tablet (7.5 mg total) by mouth at bedtime.  . Multiple Vitamins-Minerals (PRESERVISION AREDS 2) CAPS Take 1 Dose by mouth 2 (two) times daily. For eye health  . omeprazole (PRILOSEC) 40 MG capsule Take 1 capsule (40 mg total) by mouth daily.  . polycarbophil (FIBERCON) 625 MG tablet Take 625 mg by mouth daily.  . Probiotic Product (ALIGN) 4 MG CAPS Take 1 capsule by mouth daily.  . TOPROL XL 50 MG 24 hr tablet TAKE 1 TABLET DAILY  . valsartan (DIOVAN) 80 MG tablet Take 80 mg by mouth daily.  . vitamin B-12 (CYANOCOBALAMIN) 1000 MCG tablet Take 1,000 mcg by mouth daily.   . [DISCONTINUED] cetirizine (ZYRTEC) 10 MG tablet Take 1 tablet (10 mg total) by mouth daily.   No facility-administered encounter medications on file as of 02/02/2019.     Activities of Daily Living In your present state of health, do you have any difficulty performing the following activities: 02/02/2019  Hearing? Y  Comment Hearing aids  Vision? N  Difficulty  concentrating or making decisions? N  Walking or climbing stairs? N  Dressing or bathing? N  Doing errands, shopping? Y  Comment needs assist with driving  Preparing Food and eating ? Y  Comment eats at the facility  Using the Toilet? N  In the past six months, have you accidently leaked urine? Y  Do you have problems with loss of bowel control? Y  Comment a little bit occasional  Managing your Medications? N  Managing your Finances? N  Housekeeping or managing your Housekeeping? Y  Comment facility assist  Some recent data might be hidden    Patient Care Team: Gayland Curry, DO as PCP - General (Geriatric Medicine) Debara Pickett Nadean Corwin, MD as PCP - Cardiology (Cardiology)    Assessment:   This is a routine wellness examination for [place].  Exercise Activities and Dietary recommendations Current Exercise Habits: Structured exercise class, Type of exercise: walking;Other - see comments;stretching;strength training/weights(water aerobics), Time (Minutes): 60, Frequency (Times/Week): 3, Weekly Exercise (Minutes/Week): 180, Intensity: Moderate, Exercise limited by: None identified  Goals   None     Fall Risk Fall Risk  02/02/2019 11/12/2018 08/20/2018 05/07/2018 04/16/2018  Falls in the past  year? 0 0 0 0 0  Number falls in past yr: 0 0 0 0 0  Injury with Fall? 0 0 0 0 0  Risk for fall due to : - - - History of fall(s);Impaired balance/gait;Impaired mobility;Medication side effect;Other (Comment) -  Risk for fall due to: Comment - - - RA -  Follow up - - - Falls evaluation completed;Education provided;Falls prevention discussed -  Comment - - - has had PT -   Is the patient's home free of loose throw rugs in walkways, pet beds, electrical cords, etc?   no      Grab bars in the bathroom? yes      Handrails on the stairs?   no stairs      Adequate lighting?   yes  Depression Screen PHQ 2/9 Scores 02/02/2019 11/12/2018 08/20/2018 05/07/2018  PHQ - 2 Score 0 0 0 0     Cognitive  Function MMSE - Mini Mental State Exam 01/28/2018  Orientation to time 5  Orientation to Place 5  Registration 3  Attention/ Calculation 5  Recall 3  Language- name 2 objects 2  Language- repeat 1  Language- follow 3 step command 3  Language- read & follow direction 1  Write a sentence 1  Copy design 1  Total score 30     6CIT Screen 02/02/2019  What Year? 0 points  What month? 0 points  What time? 0 points  Count back from 20 0 points  Months in reverse 0 points  Repeat phrase 0 points  Total Score 0    Immunization History  Administered Date(s) Administered  . Fluad Quad(high Dose 65+) 01/05/2019  . H1N1 04/07/2008  . Influenza Whole 12/29/2008, 12/22/2009  . Influenza, High Dose Seasonal PF 01/15/2012, 12/23/2014  . Influenza,inj,Quad PF,6+ Mos 01/22/2014, 12/10/2016, 01/17/2018  . Influenza,trivalent, recombinat, inj, PF 01/05/2016  . Influenza-Unspecified 04/07/2008, 12/15/2014  . PPD Test 07/17/2010, 04/19/2015, 04/25/2015  . Pneumococcal Conjugate-13 11/10/2013  . Pneumococcal Polysaccharide-23 12/27/2010, 12/05/2016  . Tdap 04/15/2017  . Zoster Recombinat (Shingrix) 04/09/2017, 06/18/2017    Qualifies for Shingles Vaccine? Up to date   Screening Tests Health Maintenance  Topic Date Due  . TETANUS/TDAP  04/16/2027  . INFLUENZA VACCINE  Completed  . DEXA SCAN  Completed  . PNA vac Low Risk Adult  Completed    Cancer Screenings: Lung: Low Dose CT Chest recommended if Age 34-80 years, 30 pack-year currently smoking OR have quit w/in 15years. Patient does not qualify. Breast:  Up to date on Mammogram? Yes   Up to date of Bone Density/Dexa? Yes Colorectal: Age out   Additional Screenings: Hepatitis C Screening: Low Risk   Plan:   I have personally reviewed and noted the following in the patient's chart:   . Medical and social history . Use of alcohol, tobacco or illicit drugs  . Current medications and supplements . Functional ability and status .  Nutritional status . Physical activity . Advanced directives . List of other physicians . Hospitalizations, surgeries, and ER visits in previous 12 months . Vitals . Screenings to include cognitive, depression, and falls . Referrals and appointments  In addition, I have reviewed and discussed with patient certain preventive protocols, quality metrics, and best practice recommendations. A written personalized care plan for preventive services as well as general preventive health recommendations were provided to patient.  Sandrea Hughs, NP  02/02/2019

## 2019-02-02 NOTE — Telephone Encounter (Signed)
Kathy Velez notified and agreed.  Patient has an appointment scheduled for next Wednesday. This Wednesday is Full, do you want me to add her to your schedule. Please Advise.

## 2019-02-02 NOTE — Telephone Encounter (Signed)
I'd recommend she be seen.  Perhaps she can come to [facility] for an acute visit?  It sounds like she had a TIA so I don't want it to wait until next week and my schedule already is full and the morning looks like it will run straight through to the afternoon.

## 2019-02-02 NOTE — Progress Notes (Signed)
This service is provided via telemedicine  No vital signs collected/recorded due to the encounter was a telemedicine visit.   Location of patient (ex: home, work): home  Patient consents to a telephone visit:  Yes  Location of the provider (ex: office, home):  Office  Name of any referring provider:  [place] of all persons participating in the telemedicine service and their role in the encounter:  Marlowe Sax, NP, Ruthell Rummage CMA, and patient   Time spent on call:  Ruthell Rummage CMA, spent 17 minutes on phone with patient

## 2019-02-02 NOTE — Telephone Encounter (Signed)
If the vision loss happens again, she needs to go to the ED right away.  [name] been losing her memory--has cognitive impairment and that part is not new.  Let's schedule her an acute appt with me, please.

## 2019-02-03 ENCOUNTER — Other Ambulatory Visit: Payer: Self-pay

## 2019-02-03 ENCOUNTER — Encounter: Payer: Self-pay | Admitting: Family

## 2019-02-03 ENCOUNTER — Encounter: Payer: Self-pay | Admitting: Internal Medicine

## 2019-02-03 ENCOUNTER — Ambulatory Visit (INDEPENDENT_AMBULATORY_CARE_PROVIDER_SITE_OTHER): Payer: Medicare Other | Admitting: Family

## 2019-02-03 VITALS — BP 138/78 | HR 65 | Temp 98.4°F | Ht 62.0 in | Wt 124.4 lb

## 2019-02-03 DIAGNOSIS — H532 Diplopia: Secondary | ICD-10-CM

## 2019-02-03 DIAGNOSIS — D7589 Other specified diseases of blood and blood-forming organs: Secondary | ICD-10-CM | POA: Diagnosis not present

## 2019-02-03 DIAGNOSIS — E785 Hyperlipidemia, unspecified: Secondary | ICD-10-CM | POA: Diagnosis not present

## 2019-02-03 DIAGNOSIS — G459 Transient cerebral ischemic attack, unspecified: Secondary | ICD-10-CM

## 2019-02-03 DIAGNOSIS — H53133 Sudden visual loss, bilateral: Secondary | ICD-10-CM

## 2019-02-03 DIAGNOSIS — D519 Vitamin B12 deficiency anemia, unspecified: Secondary | ICD-10-CM | POA: Diagnosis not present

## 2019-02-03 DIAGNOSIS — D649 Anemia, unspecified: Secondary | ICD-10-CM | POA: Diagnosis not present

## 2019-02-03 DIAGNOSIS — I10 Essential (primary) hypertension: Secondary | ICD-10-CM | POA: Diagnosis not present

## 2019-02-03 DIAGNOSIS — E782 Mixed hyperlipidemia: Secondary | ICD-10-CM | POA: Diagnosis not present

## 2019-02-03 LAB — HEPATIC FUNCTION PANEL
ALT: 19 (ref 7–35)
AST: 23 (ref 13–35)
Alkaline Phosphatase: 86 (ref 25–125)
Bilirubin, Total: 0.6

## 2019-02-03 LAB — COMPREHENSIVE METABOLIC PANEL
Albumin: 4.6 (ref 3.5–5.0)
Calcium: 9.3 (ref 8.7–10.7)

## 2019-02-03 LAB — BASIC METABOLIC PANEL
BUN: 20 (ref 4–21)
CO2: 23 — AB (ref 13–22)
Chloride: 100 (ref 99–108)
Creatinine: 0.8 (ref 0.5–1.1)
Glucose: 91
Potassium: 5.1 (ref 3.4–5.3)
Sodium: 138 (ref 137–147)

## 2019-02-03 LAB — LIPID PANEL
Cholesterol: 134 (ref 0–200)
HDL: 54 (ref 35–70)
LDL Cholesterol: 58
Triglycerides: 110 (ref 40–160)

## 2019-02-03 LAB — CBC AND DIFFERENTIAL
HCT: 35 — AB (ref 36–46)
Hemoglobin: 11.7 — AB (ref 12.0–16.0)
WBC: 5.6

## 2019-02-03 LAB — VITAMIN B12: Vitamin B-12: 1559

## 2019-02-03 LAB — CBC: RBC: 3.41 — AB (ref 3.87–5.11)

## 2019-02-03 NOTE — Progress Notes (Signed)
Provider: Danasia Baker FNP-C  Gayland Curry, DO  Patient Care Team: Gayland Curry, DO as PCP - General (Geriatric Medicine) Pixie Casino, MD as PCP - Cardiology (Cardiology)  Extended Emergency Contact Information Primary Emergency Contact: [name],[name] Address: [place], [place] [address] [address] of [place] Phone: [phone] Relation: Daughter  Code Status:  DNR Goals of care: Advanced Directive information Advanced Directives 02/19/2018  Does Patient Have a Medical Advance Directive? No  Type of Advance Directive -  Does patient want to make changes to medical advance directive? -  Copy of [place] in Chart? -     Chief Complaint  Patient presents with  . Acute Visit    patient states report loss of vision, head pain, happened saturday night and stated she figured she would wait until Monday to tell anyone. Patient states she has regained her vision and denies any head pain.     HPI:  Pt is a 85 y.o. female seen today for an acute visit for evaluation of loss of vision.she states was watching TV on Saturday night around 9 PM.she states had abrupt impaired vision with left side head pain.she states everything on the TV looked split and was blank on the left side.Symptoms went away after about 1 to 1/2 hour.she figured out she would wait until Monday to tell anyone. Patient states she has regained her vision and denies any headache. She states did not know she could call the Nurse at night at [facility].she told the Nurse yesterday in the afternoon.Vital signs checked  B/p 150's/70 HR 69.  She denies fever,chills,vision/speech impairment or weakness.      Past Medical History:  Diagnosis Date  . Anemia   . Anxiety   . Arthritis    Back  . Atrial fibrillation ([place])   . Cancer [facility]) 2006   Colon.  Basal Cell Skin cancer- right arm  . Colon cancer ([place])   . Constipation due to pain medication therapy    after  heart surgery  . Dysrhythmia    PAF  . GERD (gastroesophageal reflux disease)   . Heart murmur   . Hypertension   . Hypothyroidism   . RA (rheumatoid arthritis) ([place])   . Restless leg   . Seizures ([place])    after Heart Surgey  . Stroke [facility])    TIA- found by neurologist after    Past Surgical History:  Procedure Laterality Date  . ABDOMINAL HYSTERECTOMY  1970   Partial   . COLON RESECTION  2006   cancer  . COLONOSCOPY    . EYE SURGERY Bilateral    Cataract  . MAXIMUM ACCESS (MAS)POSTERIOR LUMBAR INTERBODY FUSION (PLIF) 2 LEVEL N/A 04/12/2015   Procedure: Lumbar Three-Five Decompression, Pedicle Screw Fixation, and Posteriolateral Arthrodesis;  Surgeon: Erline Levine, MD;  Location: [place] NEURO ORS;  Service: Neurosurgery;  Laterality: N/A;  L3-4 L4-5 Maximum access posterior lumbar fusion, possible interbodies and resection of synovial cyst at L4-5  . MITRAL VALVE REPAIR  01/20/13   Gore-tex cords to P1, P2, and P3. Magic suture to posterior medial commisure, #30 Physio 1 ring. Done in [place]  . TONSILLECTOMY     about 1940  . TRICUSPID VALVE SURGERY  01/20/13   #28 TriAd ring done in [place]    Allergies  Allergen Reactions  . Codeine Other (See Comments)    "just don't take it well"  . Molds & Smuts   .  Pollen Extract Swelling  . Bee Venom Swelling and Rash    Outpatient Encounter Medications as of 02/03/2019  Medication Sig  . acetaminophen (TYLENOL) 500 MG tablet Take 1,000 mg by mouth every 6 (six) hours as needed for moderate pain.  Marland Kitchen aspirin EC 81 MG tablet Take 81 mg by mouth daily.  Marland Kitchen atorvastatin (LIPITOR) 20 MG tablet TAKE 1 TABLET DAILY  . Azelastine-Fluticasone 137-50 MCG/ACT SUSP Place 1 spray into the nose daily as needed (allergies).   . Biotin 5000 MCG CAPS Take 1 capsule by mouth daily.  . cholecalciferol (VITAMIN D) 1000 units tablet Take 1,000 Units by mouth daily.  . digoxin (LANOXIN) 0.125 MG tablet TAKE 1 TABLET DAILY  . ELIQUIS 2.5 MG TABS tablet  TAKE 1 TABLET TWICE A DAY  . ENBREL SURECLICK 50 MG/ML injection   . levothyroxine (SYNTHROID) 75 MCG tablet TAKE 1 TABLET DAILY BEFORE BREAKFAST FOR HYPOTHYROIDISM  . magnesium oxide (MAG-OX) 400 MG tablet Take 400 mg by mouth daily.  . Melatonin 5 MG TABS Take 1 tablet by mouth at bedtime as needed.  . mirtazapine (REMERON) 7.5 MG tablet Take 1 tablet (7.5 mg total) by mouth at bedtime.  . Multiple Vitamins-Minerals (PRESERVISION AREDS 2) CAPS Take 1 Dose by mouth 2 (two) times daily. For eye health  . omeprazole (PRILOSEC) 40 MG capsule Take 1 capsule (40 mg total) by mouth daily.  . polycarbophil (FIBERCON) 625 MG tablet Take 625 mg by mouth daily.  . Probiotic Product (ALIGN) 4 MG CAPS Take 1 capsule by mouth daily.  . TOPROL XL 50 MG 24 hr tablet TAKE 1 TABLET DAILY  . valsartan (DIOVAN) 80 MG tablet Take 80 mg by mouth daily.  . vitamin B-12 (CYANOCOBALAMIN) 1000 MCG tablet Take 1,000 mcg by mouth daily.    No facility-administered encounter medications on file as of 02/03/2019.     Review of Systems  Constitutional: Negative for appetite change, chills, fatigue and fever.  HENT: Positive for hearing loss. Negative for congestion, sinus pressure, sinus pain, sneezing, sore throat and trouble swallowing.        Chronic runny nose during meals.Hearing aids in place   Eyes: Positive for visual disturbance. Negative for pain, discharge, redness and itching.       Double vision sees Opthalmology   Respiratory: Negative for cough, chest tightness, shortness of breath and wheezing.   Cardiovascular: Negative for chest pain, palpitations and leg swelling.  Gastrointestinal: Negative for abdominal distention, abdominal pain, constipation, diarrhea, nausea and vomiting.  Endocrine: Negative for cold intolerance, heat intolerance, polydipsia, polyphagia and polyuria.  Genitourinary: Negative for difficulty urinating, dysuria, flank pain, frequency and urgency.  Musculoskeletal: Positive for  arthralgias and back pain.  Skin: Negative for color change, pallor and rash.  Neurological: Negative for dizziness, speech difficulty, weakness and headaches.       Lightheaded sometimes with standing.Numbness and tingling on feet   Psychiatric/Behavioral: Negative for agitation and sleep disturbance. The patient is not nervous/anxious.     Immunization History  Administered Date(s) Administered  . Fluad Quad(high Dose 65+) 01/05/2019  . H1N1 04/07/2008  . Influenza Whole 12/29/2008, 12/22/2009  . Influenza, High Dose Seasonal PF 01/15/2012, 12/23/2014  . Influenza,inj,Quad PF,6+ Mos 01/22/2014, 12/10/2016, 01/17/2018  . Influenza,trivalent, recombinat, inj, PF 01/05/2016  . Influenza-Unspecified 04/07/2008, 12/15/2014  . PPD Test 07/17/2010, 04/19/2015, 04/25/2015  . Pneumococcal Conjugate-13 11/10/2013  . Pneumococcal Polysaccharide-23 12/27/2010, 12/05/2016  . Tdap 04/15/2017  . Zoster Recombinat (Shingrix) 04/09/2017, 06/18/2017   Pertinent  Health Maintenance Due  Topic Date Due  . INFLUENZA VACCINE  Completed  . DEXA SCAN  Completed  . PNA vac Low Risk Adult  Completed   Fall Risk  02/03/2019 02/02/2019 11/12/2018 08/20/2018 05/07/2018  Falls in the past year? 0 0 0 0 0  Number falls in past yr: 0 0 0 0 0  Injury with Fall? 0 0 0 0 0  Risk for fall due to : - - - - History of fall(s);Impaired balance/gait;Impaired mobility;Medication side effect;Other (Comment)  Risk for fall due to: Comment - - - - RA  Follow up - - - - Falls evaluation completed;Education provided;Falls prevention discussed  Comment - - - - has had PT    Vitals:   02/03/19 1332  BP: 138/78  Pulse: 65  Temp: 98.4 F (36.9 C)  TempSrc: Temporal  SpO2: 97%  Weight: 124 lb 6.4 oz (56.4 kg)  Height: 5\' 2"  (1.575 m)   Body mass index is 22.75 kg/m. Physical Exam Vitals signs reviewed.  Constitutional:      General: She is not in acute distress.    Appearance: She is normal weight. She is not  ill-appearing.  HENT:     Head: Normocephalic.     Right Ear: Tympanic membrane, ear canal and external ear normal. There is no impacted cerumen.     Left Ear: Tympanic membrane, ear canal and external ear normal. There is no impacted cerumen.     Nose: No congestion.     Mouth/Throat:     Mouth: Mucous membranes are moist.     Pharynx: Oropharynx is clear. No oropharyngeal exudate or posterior oropharyngeal erythema.  Eyes:     General: No scleral icterus.       Right eye: No discharge.        Left eye: No discharge.     Extraocular Movements: Extraocular movements intact.     Conjunctiva/sclera: Conjunctivae normal.     Pupils: Pupils are equal, round, and reactive to light.  Neck:     Musculoskeletal: Normal range of motion. No neck rigidity or muscular tenderness.     Vascular: No carotid bruit.  Cardiovascular:     Rate and Rhythm: Normal rate. Rhythm irregular.     Pulses: Normal pulses.     Heart sounds: Normal heart sounds. No murmur. No friction rub. No gallop.   Pulmonary:     Effort: Pulmonary effort is normal. No respiratory distress.     Breath sounds: Normal breath sounds. No wheezing, rhonchi or rales.  Chest:     Chest wall: No tenderness.  Abdominal:     General: Bowel sounds are normal. There is no distension.     Palpations: Abdomen is soft. There is no mass.     Tenderness: There is no abdominal tenderness. There is no right CVA tenderness, left CVA tenderness, guarding or rebound.  Musculoskeletal:        General: Deformity present. No swelling or tenderness.     Right lower leg: No edema.     Left lower leg: No edema.     Comments: Unsteady gait   Lymphadenopathy:     Cervical: No cervical adenopathy.  Skin:    General: Skin is warm and dry.     Coloration: Skin is not pale.     Findings: No bruising, erythema or rash.  Neurological:     Mental Status: She is alert and oriented to person, place, and time.     Cranial Nerves: No  cranial nerve deficit.      Sensory: No sensory deficit.     Motor: No weakness.     Coordination: Coordination normal.     Gait: Gait abnormal.  Psychiatric:        Mood and Affect: Mood normal.        Behavior: Behavior normal.        Thought Content: Thought content normal.        Judgment: Judgment normal.     Labs reviewed: Recent Labs    05/08/18 0500 07/16/18  NA 136* 134*  K 5.4* 4.8  BUN 21 17  CREATININE 0.8 0.7   Recent Labs    05/08/18 0500 07/16/18  AST 25 18  ALT 20 17  ALKPHOS 88 89   Recent Labs    05/08/18 0500 07/16/18  WBC 8.9 9.8  NEUTROABS  --  5  HGB 12.0 11.7*  HCT 35* 33*  PLT 188 163   No results found for: TSH Lab Results  Component Value Date   HGBA1C 5.5 01/12/2013   Lab Results  Component Value Date   CHOL 132 09/03/2017   HDL 75 (A) 09/03/2017   LDLCALC 49 09/03/2017   TRIG 55 09/03/2017    Significant Diagnostic Results in last 30 days:  No results found.  Assessment/Plan 1. Sudden loss of vision, bilateral Had sudden loss of vision with headache while watching TV.symptoms resolved after about 30 minutes.Asymptomatic today.will send to Ophthalmology for evaluation.  - Ambulatory referral to Ophthalmology  2. Double vision See double vision with moving of head laterally.Has seen  - Ambulatory referral to Ophthalmology  3. TIA (transient ischemic attack) Hx TIA.vital signs and neuro checks normal today.decline evaluation in the ED for recent sudden loss of vision. Would like outpatient work up.will send to Neurology if changes noted on CT scan. - CT HEAD WO CONTRAST; Future  Family/ staff Communication: Reviewed plan of care with patient   Labs/tests ordered: None   Zamia Tyminski C Lorella Gomez, NP

## 2019-02-03 NOTE — Telephone Encounter (Signed)
Patient notified and agreed. Appointment scheduled for today at 1:30 with [facility]

## 2019-02-04 ENCOUNTER — Encounter: Payer: Self-pay | Admitting: *Deleted

## 2019-02-11 ENCOUNTER — Other Ambulatory Visit: Payer: Self-pay

## 2019-02-11 ENCOUNTER — Non-Acute Institutional Stay: Payer: Medicare Other | Admitting: Internal Medicine

## 2019-02-11 ENCOUNTER — Encounter: Payer: Self-pay | Admitting: Internal Medicine

## 2019-02-11 VITALS — HR 97 | Temp 98.4°F | Ht 62.0 in | Wt 125.0 lb

## 2019-02-11 DIAGNOSIS — I6529 Occlusion and stenosis of unspecified carotid artery: Secondary | ICD-10-CM

## 2019-02-11 DIAGNOSIS — M21961 Unspecified acquired deformity of right lower leg: Secondary | ICD-10-CM

## 2019-02-11 DIAGNOSIS — G3184 Mild cognitive impairment, so stated: Secondary | ICD-10-CM | POA: Diagnosis not present

## 2019-02-11 DIAGNOSIS — M06049 Rheumatoid arthritis without rheumatoid factor, unspecified hand: Secondary | ICD-10-CM

## 2019-02-11 DIAGNOSIS — G459 Transient cerebral ischemic attack, unspecified: Secondary | ICD-10-CM

## 2019-02-11 DIAGNOSIS — I4821 Permanent atrial fibrillation: Secondary | ICD-10-CM

## 2019-02-11 DIAGNOSIS — R490 Dysphonia: Secondary | ICD-10-CM | POA: Diagnosis not present

## 2019-02-11 DIAGNOSIS — J310 Chronic rhinitis: Secondary | ICD-10-CM

## 2019-02-11 NOTE — Progress Notes (Signed)
Location:   [facility]   Place of Service:   clinic  Provider: Qunisha Bryk L. Mariea Clonts, D.O., C.M.D.  Code Status: she is going to think about this--we discussed some today  Goals of Care:  Advanced Directives 02/11/2019  Does Patient Have a Medical Advance Directive? Yes  Type of Advance Directive Kathy Velez  Does patient want to make changes to medical advance directive? No - Patient declined  Copy of [place] in Chart? Yes - validated most recent copy scanned in chart (See row information)   Chief Complaint  Patient presents with  . Medical Management of Chronic Issues    3 month follow-up. Patient had an event 1.5 weeks ago experiencing head pain and blindness in left eye x 2 hours. Patient with a history of TIA's and was under the care of a neurologist prior to moving here about 1 year or so ago.   Marland Kitchen Hoarse    Ongoing hoarsness, sneezing and runny nose (after dinner).     HPI: Patient is a 85 y.o. female seen today for medical management of chronic diseases.   She has had 3 prior TIAs in years passe.d  Was sitting quietly on the sofa watching tv.  She got a little pain on her left side.  The tv had half the image on that side blocked out.  It lasted about 1-1.5 hrs.  Then resolved.   BP had been a little high 02/02/19 after walking to satellite clinic--was 154/70. 11/16 was 130/70. Reviewed notes about that event 1.5 weeks ago.  She was seen in our main office by NP and CT head was ordered, but it appears this has not yet been arranged for patient.    She is leaving today to be gone for 12 days to their place in [place] with just her daughters.  The granddaughters will be tested before they come there for thanksgiving.  [name] had her problems with hoarseness--not sure if postnasal drip or around vocal cords.  Phlegm from nose is clear.  Occasionally light yellow.  After eating supper, her nose literally starts running, she may have a sneezing  attack with 6-12 good sneezes.  Not having much indigestion--may have take tums a couple of times.  Tries to eat a healthy diet.  She feels like it's more coming down from her nose than going up.  She does use a nasal spray already as needed.  Discussed that these are symptoms she may be stuck with at her age.    RA:  Hands, wrists, back first thing in am are just there.  She takes tylenol two in am and two at hs.  She is on enbrel.  She does the exercises with Robin on TV.  walks most days for about 30 mins.  Past Medical History:  Diagnosis Date  . Anemia   . Anxiety   . Arthritis    Back  . Atrial fibrillation ([place])   . Cancer [facility]) 2006   Colon.  Basal Cell Skin cancer- right arm  . Colon cancer ([place])   . Constipation due to pain medication therapy    after heart surgery  . Dysrhythmia    PAF  . GERD (gastroesophageal reflux disease)   . Heart murmur   . Hypertension   . Hypothyroidism   . RA (rheumatoid arthritis) ([place])   . Restless leg   . Seizures ([place])    after Heart Surgey  . Stroke [facility])    TIA-  found by neurologist after     Past Surgical History:  Procedure Laterality Date  . ABDOMINAL HYSTERECTOMY  1970   Partial   . COLON RESECTION  2006   cancer  . COLONOSCOPY    . EYE SURGERY Bilateral    Cataract  . MAXIMUM ACCESS (MAS)POSTERIOR LUMBAR INTERBODY FUSION (PLIF) 2 LEVEL N/A 04/12/2015   Procedure: Lumbar Three-Five Decompression, Pedicle Screw Fixation, and Posteriolateral Arthrodesis;  Surgeon: Erline Levine, MD;  Location: [place] NEURO ORS;  Service: Neurosurgery;  Laterality: N/A;  L3-4 L4-5 Maximum access posterior lumbar fusion, possible interbodies and resection of synovial cyst at L4-5  . MITRAL VALVE REPAIR  01/20/13   Gore-tex cords to P1, P2, and P3. Magic suture to posterior medial commisure, #30 Physio 1 ring. Done in [place]  . TONSILLECTOMY     about 1940  . TRICUSPID VALVE SURGERY  01/20/13   #28 TriAd ring done in [place]    Allergies    Allergen Reactions  . Codeine Other (See Comments)    "just don't take it well"  . Molds & Smuts   . Pollen Extract Swelling  . Bee Venom Swelling and Rash    Outpatient Encounter Medications as of 02/11/2019  Medication Sig  . acetaminophen (TYLENOL) 500 MG tablet Take 1,000 mg by mouth every 6 (six) hours as needed for moderate pain.  Marland Kitchen aspirin EC 81 MG tablet Take 81 mg by mouth daily.  Marland Kitchen atorvastatin (LIPITOR) 20 MG tablet TAKE 1 TABLET DAILY  . Azelastine-Fluticasone 137-50 MCG/ACT SUSP Place 1 spray into the nose daily as needed (allergies).   . Biotin 5000 MCG CAPS Take 1 capsule by mouth daily.  . cholecalciferol (VITAMIN D) 1000 units tablet Take 1,000 Units by mouth daily.  . digoxin (LANOXIN) 0.125 MG tablet TAKE 1 TABLET DAILY  . ELIQUIS 2.5 MG TABS tablet TAKE 1 TABLET TWICE A DAY  . ENBREL SURECLICK 50 MG/ML injection   . levothyroxine (SYNTHROID) 75 MCG tablet TAKE 1 TABLET DAILY BEFORE BREAKFAST FOR HYPOTHYROIDISM  . magnesium oxide (MAG-OX) 400 MG tablet Take 400 mg by mouth daily.  . Melatonin 5 MG TABS Take 1 tablet by mouth at bedtime as needed.  . mirtazapine (REMERON) 7.5 MG tablet Take 1 tablet (7.5 mg total) by mouth at bedtime.  . Multiple Vitamins-Minerals (PRESERVISION AREDS 2) CAPS Take 1 Dose by mouth 2 (two) times daily. For eye health  . omeprazole (PRILOSEC) 40 MG capsule Take 1 capsule (40 mg total) by mouth daily.  . polycarbophil (FIBERCON) 625 MG tablet Take 625 mg by mouth daily.  . Probiotic Product (ALIGN) 4 MG CAPS Take 1 capsule by mouth daily.  . TOPROL XL 50 MG 24 hr tablet TAKE 1 TABLET DAILY  . valsartan (DIOVAN) 80 MG tablet Take 80 mg by mouth daily.  . vitamin B-12 (CYANOCOBALAMIN) 1000 MCG tablet Take 1,000 mcg by mouth daily.    No facility-administered encounter medications on file as of 02/11/2019.     Review of Systems:  Review of Systems  Constitutional: Negative for chills, fever, malaise/fatigue and weight loss.  HENT:  Positive for hearing loss.   Eyes: Negative for blurred vision.  Respiratory: Negative for shortness of breath.   Cardiovascular: Negative for chest pain, palpitations and leg swelling.  Gastrointestinal: Positive for constipation. Negative for abdominal pain, blood in stool, diarrhea and melena.  Genitourinary: Negative for dysuria.  Musculoskeletal: Positive for back pain and joint pain. Negative for falls.  Skin: Negative for itching and rash.  Neurological: Negative for dizziness, sensory change, speech change, focal weakness, loss of consciousness and weakness.  Endo/Heme/Allergies: Bruises/bleeds easily.  Psychiatric/Behavioral: Positive for memory loss. Negative for depression. The patient is not nervous/anxious and does not have insomnia.     Health Maintenance  Topic Date Due  . TETANUS/TDAP  04/16/2027  . INFLUENZA VACCINE  Completed  . DEXA SCAN  Completed  . PNA vac Low Risk Adult  Completed    Physical Exam: Vitals:   02/11/19 1122  Pulse: 97  Temp: 98.4 F (36.9 C)  TempSrc: Temporal  SpO2: 96%  Weight: 125 lb (56.7 kg)  Height: 5\' 2"  (1.575 m)   Body mass index is 22.86 kg/m. Physical Exam Vitals signs reviewed.  Constitutional:      General: She is not in acute distress.    Appearance: Normal appearance. She is not ill-appearing or toxic-appearing.  HENT:     Head: Normocephalic and atraumatic.     Right Ear: External ear normal.     Left Ear: External ear normal.  Eyes:     Extraocular Movements: Extraocular movements intact.     Pupils: Pupils are equal, round, and reactive to light.  Cardiovascular:     Rate and Rhythm: Rhythm irregular.     Heart sounds: No murmur.  Pulmonary:     Effort: Pulmonary effort is normal.     Breath sounds: Normal breath sounds. No wheezing, rhonchi or rales.  Abdominal:     General: Bowel sounds are normal. There is distension.     Palpations: Abdomen is soft. There is no mass.     Tenderness: There is no  abdominal tenderness.  Musculoskeletal: Normal range of motion.        General: Deformity present.     Comments: Right knee bows inward making gait unsteady  Skin:    General: Skin is warm and dry.     Capillary Refill: Capillary refill takes less than 2 seconds.  Neurological:     General: No focal deficit present.     Mental Status: She is alert and oriented to person, place, and time.     Cranial Nerves: No cranial nerve deficit.     Sensory: No sensory deficit.     Motor: No weakness.     Coordination: Coordination normal.     Gait: Gait abnormal.     Deep Tendon Reflexes: Reflexes normal.  Psychiatric:        Mood and Affect: Mood normal.        Behavior: Behavior normal.     Labs reviewed: Basic Metabolic Panel: Recent Labs    05/08/18 0500 07/16/18 02/03/19  NA 136* 134* 138  K 5.4* 4.8 5.1  CL  --   --  100  CO2  --   --  23*  BUN 21 17 20   CREATININE 0.8 0.7 0.8  CALCIUM  --   --  9.3   Liver Function Tests: Recent Labs    05/08/18 0500 07/16/18 02/03/19  AST 25 18 23   ALT 20 17 19   ALKPHOS 88 89 86  ALBUMIN  --   --  4.6   No results for input(s): LIPASE, AMYLASE in the last 8760 hours. No results for input(s): AMMONIA in the last 8760 hours. CBC: Recent Labs    05/08/18 0500 07/16/18 02/03/19  WBC 8.9 9.8 5.6  NEUTROABS  --  5  --   HGB 12.0 11.7* 11.7*  HCT 35* 33* 35*  PLT 188 163  --  Lipid Panel: Recent Labs    02/03/19  CHOL 134  HDL 54  LDLCALC 58  TRIG 110   Lab Results  Component Value Date   HGBA1C 5.5 01/12/2013    Assessment/Plan 1. Transient ischemic attack (TIA) - event as described in HPI -no residual symptoms -await CT head already ordered 8 days ago to be sure no changes in brain - will also check carotid dopplers as h/o stenosis and now recent TIA--likely will be medical management regardless; pt leaving town today unfortunately and won't be back until the Monday after thanksgiving  - US Carotid Duplex Bilateral;  Future -pt was counseled that if she has recurrence of amaurosis fugax presentation, or other classic stroke signs, to proceed to ED (when in [place]) or here to call nurse/security immediately  2. Permanent atrial fibrillation (HCC) -flutter -cont eliquis, dig -labs wnl  3. Stenosis of carotid artery, unspecified laterality -per previous records -f/u carotid doppler due to TIA event  4. Mild cognitive impairment with memory loss -repeats some concerns visit to visit not recollecting what was previously discussed -scored perfect on 6-CIT during wellness visit recently and did get 30/30 on mmse last year  5. Rheumatoid arthritis involving hand with negative rheumatoid factor, unspecified laterality ([place] ) -continues enbrel, tylenol  6. Acquired deformity of right knee -has gotten injections in the knee -still pops at times and causes her pain -has also had PT after he ankle injury that led to her knee problem  7.  Hoarseness -seems predominantly due to postnasal drip -discussed that risks outweigh benefits for additional allergy meds and nasal sprays--advised to tolerate -role of GERD also possible, but she has not had much difficulty with it lately with prilosec daily  8.  Gustatory rhinitis -more common with age -again, the atrovent nasal spray side effects of drying out the mouth outweigh benefits  -advised to carry tissue to catch dripping  Labs/tests ordered:  Carotid dopplers and the CT that was already ordered--pt will be with family until the week after thanksgiving  Next appt:  03/11/2019 for f/u after imaging studies  Smera Guyette L. Oluwademilade Kellett, D.O. [facility] [address], [place] [address] Cell Phone (Mon-Fri 8am-5pm):  [phone] On Call:  [phone] & follow prompts after 5pm & weekends Office Phone:  [phone] Office Fax:  [phone]

## 2019-02-26 ENCOUNTER — Other Ambulatory Visit: Payer: Self-pay

## 2019-02-26 DIAGNOSIS — Z20822 Contact with and (suspected) exposure to covid-19: Secondary | ICD-10-CM

## 2019-02-26 DIAGNOSIS — Z20828 Contact with and (suspected) exposure to other viral communicable diseases: Secondary | ICD-10-CM | POA: Diagnosis not present

## 2019-02-27 LAB — NOVEL CORONAVIRUS, NAA: SARS-CoV-2, NAA: NOT DETECTED

## 2019-03-05 ENCOUNTER — Encounter: Payer: Self-pay | Admitting: Internal Medicine

## 2019-03-05 MED ORDER — EPINEPHRINE 0.3 MG/0.3ML IJ SOAJ: 0.3000 mg | INTRAMUSCULAR | 0 refills | Status: DC | PRN

## 2019-03-05 MED ORDER — METOPROLOL SUCCINATE ER 50 MG PO TB24: 50.0000 mg | ORAL_TABLET | Freq: Every day | ORAL | 0 refills | Status: DC

## 2019-03-05 NOTE — Addendum Note (Signed)
Addended by: Despina Hidden on: 03/05/2019 10:35 AM   Modules accepted: Orders

## 2019-03-06 ENCOUNTER — Ambulatory Visit
Admission: RE | Admit: 2019-03-06 | Discharge: 2019-03-06 | Disposition: A | Discharge status: Home / Self Care | Payer: Medicare Other | Source: Ambulatory Visit | Attending: Family | Admitting: Family

## 2019-03-06 ENCOUNTER — Ambulatory Visit
Admission: RE | Admit: 2019-03-06 | Discharge: 2019-03-06 | Disposition: A | Discharge status: Home / Self Care | Payer: Medicare Other | Source: Ambulatory Visit | Attending: Internal Medicine | Admitting: Internal Medicine

## 2019-03-06 DIAGNOSIS — G459 Transient cerebral ischemic attack, unspecified: Secondary | ICD-10-CM

## 2019-03-06 DIAGNOSIS — Z8673 Personal history of transient ischemic attack (TIA), and cerebral infarction without residual deficits: Secondary | ICD-10-CM | POA: Diagnosis not present

## 2019-03-06 DIAGNOSIS — I639 Cerebral infarction, unspecified: Secondary | ICD-10-CM | POA: Diagnosis not present

## 2019-03-06 DIAGNOSIS — I6523 Occlusion and stenosis of bilateral carotid arteries: Secondary | ICD-10-CM | POA: Diagnosis not present

## 2019-03-09 ENCOUNTER — Ambulatory Visit (INDEPENDENT_AMBULATORY_CARE_PROVIDER_SITE_OTHER): Payer: Medicare Other | Admitting: Internal Medicine

## 2019-03-09 ENCOUNTER — Encounter: Payer: Self-pay | Admitting: Internal Medicine

## 2019-03-09 ENCOUNTER — Ambulatory Visit: Payer: Medicare Other | Admitting: Internal Medicine

## 2019-03-09 ENCOUNTER — Other Ambulatory Visit: Payer: Self-pay

## 2019-03-09 VITALS — BP 126/82 | HR 76 | Temp 97.5°F | Ht 62.0 in | Wt 126.6 lb

## 2019-03-09 DIAGNOSIS — G459 Transient cerebral ischemic attack, unspecified: Secondary | ICD-10-CM | POA: Diagnosis not present

## 2019-03-09 DIAGNOSIS — R0609 Other forms of dyspnea: Secondary | ICD-10-CM

## 2019-03-09 DIAGNOSIS — J310 Chronic rhinitis: Secondary | ICD-10-CM

## 2019-03-09 DIAGNOSIS — I6529 Occlusion and stenosis of unspecified carotid artery: Secondary | ICD-10-CM

## 2019-03-09 DIAGNOSIS — I679 Cerebrovascular disease, unspecified: Secondary | ICD-10-CM

## 2019-03-09 DIAGNOSIS — R0600 Dyspnea, unspecified: Secondary | ICD-10-CM

## 2019-03-09 DIAGNOSIS — G3184 Mild cognitive impairment, so stated: Secondary | ICD-10-CM

## 2019-03-09 DIAGNOSIS — I4821 Permanent atrial fibrillation: Secondary | ICD-10-CM

## 2019-03-09 NOTE — Patient Instructions (Addendum)
Try claritin or xyzal for 2 weeks, then return to zyrtec.  This may help your runny nose.  If not, it may just be an age-related problem.    Fortunately, you do not have significant plaque in the arteries of your neck.   Your CT head showed an old stroke in the pons part of your brain and chronic poor circulation in your brain that is likely affecting your memory some now.  I think your atrial fibrillation is likely causing you to be more short of breath along with deconditioning from less activity amid covid.

## 2019-03-09 NOTE — Progress Notes (Signed)
Location:  [facility] clinic  Provider: Dr. Hollace Kinnier  Goals of Care:  Advanced Directives 02/11/2019  Does Patient Have a Medical Advance Directive? Yes  Type of Advance Directive [place]  Does patient want to make changes to medical advance directive? No - Patient declined  Copy of [place] in Chart? Yes - validated most recent copy scanned in chart (See row information)     Chief Complaint  Patient presents with  . Medical Management of Chronic Issues    41mth follow-up, Ultrasound, CT Scan    HPI: Patient is a 85 y.o. female seen today for imaging results.   Carotid doppler and CT of head reviewed with patient.   She has not experienced any visual field disturbances since initial event.   She is short of breath with exertion. She thinks it has increased over the past several months. She will also feel light headed when the shortness of breath occurs.   She complains of a runny nose when she eats. She is also sneezing more often than normal. She takes zyrtec every evening. She has taken this for the past couple months along with her nasal spray.     Past Medical History:  Diagnosis Date  . Anemia   . Anxiety   . Arthritis    Back  . Atrial fibrillation ([place])   . Cancer [facility]) 2006   Colon.  Basal Cell Skin cancer- right arm  . Colon cancer ([place])   . Constipation due to pain medication therapy    after heart surgery  . Dysrhythmia    PAF  . GERD (gastroesophageal reflux disease)   . Heart murmur   . Hypertension   . Hypothyroidism   . RA (rheumatoid arthritis) ([place])   . Restless leg   . Seizures ([place])    after Heart Surgey  . Stroke [facility])    TIA- found by neurologist after     Past Surgical History:  Procedure Laterality Date  . ABDOMINAL HYSTERECTOMY  1970   Partial   . COLON RESECTION  2006   cancer  . COLONOSCOPY    . EYE SURGERY Bilateral    Cataract  . MAXIMUM ACCESS (MAS)POSTERIOR LUMBAR INTERBODY FUSION  (PLIF) 2 LEVEL N/A 04/12/2015   Procedure: Lumbar Three-Five Decompression, Pedicle Screw Fixation, and Posteriolateral Arthrodesis;  Surgeon: Erline Levine, MD;  Location: [place] [facility];  Service: Neurosurgery;  Laterality: N/A;  L3-4 L4-5 Maximum access posterior lumbar fusion, possible interbodies and resection of synovial cyst at L4-5  . MITRAL VALVE REPAIR  01/20/13   Gore-tex cords to P1, P2, and P3. Magic suture to posterior medial commisure, #30 Physio 1 ring. Done in [place]  . TONSILLECTOMY     about 1940  . TRICUSPID VALVE SURGERY  01/20/13   #28 TriAd ring done in [place]    Allergies  Allergen Reactions  . Codeine Other (See Comments)    "just don't take it well"  . Molds & Smuts   . Pollen Extract Swelling  . Bee Venom Swelling and Rash    Outpatient Encounter Medications as of 03/09/2019  Medication Sig  . acetaminophen (TYLENOL) 500 MG tablet Take 1,000 mg by mouth every 6 (six) hours as needed for moderate pain.  Marland Kitchen aspirin EC 81 MG tablet Take 81 mg by mouth daily.  Marland Kitchen atorvastatin (LIPITOR) 20 MG tablet TAKE 1 TABLET DAILY  . Azelastine-Fluticasone 137-50 MCG/ACT SUSP Place 1 spray into the nose daily as needed (allergies).   Marland Kitchen  Biotin 5000 MCG CAPS Take 1 capsule by mouth daily.  . cholecalciferol (VITAMIN D) 1000 units tablet Take 1,000 Units by mouth daily.  . digoxin (LANOXIN) 0.125 MG tablet TAKE 1 TABLET DAILY  . ELIQUIS 2.5 MG TABS tablet TAKE 1 TABLET TWICE A DAY  . ENBREL SURECLICK 50 MG/ML injection   . EPINEPHrine 0.3 mg/0.3 mL IJ SOAJ injection Inject 0.3 mLs (0.3 mg total) into the muscle as needed for anaphylaxis.  Marland Kitchen levothyroxine (SYNTHROID) 75 MCG tablet TAKE 1 TABLET DAILY BEFORE BREAKFAST FOR HYPOTHYROIDISM  . magnesium oxide (MAG-OX) 400 MG tablet Take 400 mg by mouth daily.  . Melatonin 5 MG TABS Take 1 tablet by mouth at bedtime as needed.  . metoprolol succinate (TOPROL XL) 50 MG 24 hr tablet Take 1 tablet (50 mg total) by mouth daily. Take with or  immediately following a meal.  . mirtazapine (REMERON) 7.5 MG tablet Take 1 tablet (7.5 mg total) by mouth at bedtime.  . Multiple Vitamins-Minerals (PRESERVISION AREDS 2) CAPS Take 1 Dose by mouth 2 (two) times daily. For eye health  . omeprazole (PRILOSEC) 40 MG capsule Take 1 capsule (40 mg total) by mouth daily.  . polycarbophil (FIBERCON) 625 MG tablet Take 625 mg by mouth daily.  . Probiotic Product (ALIGN) 4 MG CAPS Take 1 capsule by mouth daily.  . valsartan (DIOVAN) 80 MG tablet Take 80 mg by mouth daily.  . vitamin B-12 (CYANOCOBALAMIN) 1000 MCG tablet Take 1,000 mcg by mouth daily.    No facility-administered encounter medications on file as of 03/09/2019.    Review of Systems:  Review of Systems  Constitutional: Negative for activity change, appetite change and fatigue.  HENT: Positive for rhinorrhea and sneezing.   Respiratory: Negative for cough, shortness of breath and wheezing.   Cardiovascular: Negative for chest pain and leg swelling.  Neurological: Negative for dizziness, weakness and headaches.  Psychiatric/Behavioral: Negative for dysphoric mood. The patient is not nervous/anxious.     Health Maintenance  Topic Date Due  . TETANUS/TDAP  04/16/2027  . INFLUENZA VACCINE  Completed  . DEXA SCAN  Completed  . PNA vac Low Risk Adult  Completed    Physical Exam: Vitals:   03/09/19 1135  BP: 126/82  Pulse: 76  Temp: (!) 97.5 F (36.4 C)  TempSrc: Temporal  SpO2: 97%  Weight: 126 lb 9.6 oz (57.4 kg)  Height: 5\' 2"  (1.575 m)   Body mass index is 23.16 kg/m. Physical Exam Vitals reviewed.  Constitutional:      Appearance: Normal appearance. She is normal weight.  HENT:     Head: Normocephalic.     Nose: Rhinorrhea present.  Cardiovascular:     Rate and Rhythm: Normal rate. Rhythm irregular.     Pulses: Normal pulses.     Heart sounds: Normal heart sounds. No murmur.  Pulmonary:     Effort: Pulmonary effort is normal. No respiratory distress.      Breath sounds: Normal breath sounds. No wheezing.  Musculoskeletal:     Right lower leg: No edema.     Left lower leg: No edema.  Skin:    General: Skin is warm and dry.     Capillary Refill: Capillary refill takes less than 2 seconds.  Neurological:     General: No focal deficit present.     Mental Status: She is alert and oriented to person, place, and time. Mental status is at baseline.  Psychiatric:        Mood and  Affect: Mood normal.        Behavior: Behavior normal.        Thought Content: Thought content normal.        Judgment: Judgment normal.     Labs reviewed: Basic Metabolic Panel: Recent Labs    05/08/18 0500 07/16/18 0000 02/03/19 0000  NA 136* 134* 138  K 5.4* 4.8 5.1  CL  --   --  100  CO2  --   --  23*  BUN 21 17 20   CREATININE 0.8 0.7 0.8  CALCIUM  --   --  9.3   Liver Function Tests: Recent Labs    05/08/18 0500 07/16/18 0000 02/03/19 0000  AST 25 18 23   ALT 20 17 19   ALKPHOS 88 89 86  ALBUMIN  --   --  4.6   No results for input(s): LIPASE, AMYLASE in the last 8760 hours. No results for input(s): AMMONIA in the last 8760 hours. CBC: Recent Labs    05/08/18 0500 07/16/18 0000 02/03/19 0000  WBC 8.9 9.8 5.6  NEUTROABS  --  5  --   HGB 12.0 11.7* 11.7*  HCT 35* 33* 35*  PLT 188 163  --    Lipid Panel: Recent Labs    02/03/19 0000  CHOL 134  HDL 54  LDLCALC 58  TRIG 110   Lab Results  Component Value Date   HGBA1C 5.5 01/12/2013    Procedures since last visit: CT HEAD WO CONTRAST  Result Date: 03/06/2019 CLINICAL DATA:  Stroke follow-up.  Visual disturbance left eye. EXAM: CT HEAD WITHOUT CONTRAST TECHNIQUE: Contiguous axial images were obtained from the base of the skull through the vertex without intravenous contrast. COMPARISON:  None. FINDINGS: Brain: Generalized atrophy without hydrocephalus. Small white matter hypodensities in the frontal lobes bilaterally most likely chronic ischemia. 5 mm hypodensity left pons likely  an area of chronic infarction. Negative for acute cortical infarct. Negative for hemorrhage or mass. No midline shift. Vascular: Negative for hyperdense vessel Skull: Negative Sinuses/Orbits: Paranasal sinuses clear. Bilateral cataract surgery. Other: None IMPRESSION: Atrophy and mild ischemic changes likely chronic. Small hypodensity left pons likely an area of chronic ischemia. No acute abnormality Electronically Signed   By: Franchot Gallo M.D.   On: 03/06/2019 16:51   US Carotid Duplex Bilateral  Result Date: 03/06/2019 CLINICAL DATA:  85 year old female with a history of TIA EXAM: BILATERAL CAROTID DUPLEX ULTRASOUND TECHNIQUE: Pearline Cables scale imaging, color Doppler and duplex ultrasound were performed of bilateral carotid and vertebral arteries in the neck. COMPARISON:  None. FINDINGS: Criteria: Quantification of carotid stenosis is based on velocity parameters that correlate the residual internal carotid diameter with NASCET-based stenosis levels, using the diameter of the distal internal carotid lumen as the denominator for stenosis measurement. The following velocity measurements were obtained: RIGHT ICA:  Systolic 123XX123 cm/sec, Diastolic 39 cm/sec CCA:  0000000 cm/sec SYSTOLIC ICA/CCA RATIO:  1.2 ECA:  101 cm/sec LEFT ICA:  Systolic 123XX123 cm/sec, Diastolic 29 cm/sec CCA:  0000000 cm/sec SYSTOLIC ICA/CCA RATIO:  1.0 ECA:  69 cm/sec Right Brachial SBP: 160 Left Brachial SBP: 151 RIGHT CAROTID ARTERY: No significant calcifications of the right common carotid artery. Intermediate waveform maintained. Moderate heterogeneous and partially calcified plaque at the right carotid bifurcation. No significant lumen shadowing. Low resistance waveform of the right ICA. No significant tortuosity. RIGHT VERTEBRAL ARTERY: Antegrade flow with low resistance waveform. LEFT CAROTID ARTERY: No significant calcifications of the left common carotid artery. Intermediate waveform maintained. Moderate heterogeneous and  partially calcified  plaque at the left carotid bifurcation. No significant lumen shadowing. Low resistance waveform of the left ICA. No significant tortuosity. LEFT VERTEBRAL ARTERY:  Antegrade flow with low resistance waveform. IMPRESSION: Color duplex indicates moderate heterogeneous and calcified plaque, with no hemodynamically significant stenosis by duplex criteria in the extracranial cerebrovascular circulation. Signed, Dulcy Fanny. Dellia Nims, RPVI [facility] Radiology Electronically Signed   By: Corrie Mckusick D.O.   On: 03/06/2019 14:51    Assessment/Plan 1. Transient ischemic attack (TIA) - CT head with no changes to brain, confirms old episodes - carotid dopplers negative - continue to monitor for recurrent amaurosis fugax or classic stroke signs and proceede to ED or call PCP office immediately  2. Mild cognitive impairment with memory loss  - stable, no progression - she continues to repeat things previously discussed   3. Cerebrovascular disease - stable at this time  4. Chronic rhinitis - suspect allergies and increase age as primary cause - may stop Zyrtec for a few weeks and change to another OTC antihistamine like clairtin or Xyzal  5. Permanent atrial fibrillation (HCC) - continue eliquis and digoxin  6. Dyspnea on exertion - suspect covid has made her less active and deconditioned more than normal, also part of aging process - suspect diagnosis of a-fib contributes to dyspnea - told to contact PCP if dyspnea increases - complete blood panel with differential/platelets- future - basic metabolic panel- future   Labs/tests ordered: Complete blood count with differential/platelets, basic metabolic panel- future Next appt:  4 month follow up

## 2019-03-11 ENCOUNTER — Encounter: Payer: Self-pay | Admitting: Internal Medicine

## 2019-03-13 ENCOUNTER — Encounter: Payer: Self-pay | Admitting: Internal Medicine

## 2019-03-13 NOTE — Telephone Encounter (Signed)
Message routed to Reed, Tiffany L, DO  

## 2019-03-19 ENCOUNTER — Ambulatory Visit: Payer: Self-pay | Admitting: Internal Medicine

## 2019-03-19 ENCOUNTER — Encounter: Payer: Self-pay | Admitting: Internal Medicine

## 2019-03-23 ENCOUNTER — Ambulatory Visit: Payer: Medicare Other | Attending: Internal Medicine

## 2019-03-23 DIAGNOSIS — Z20822 Contact with and (suspected) exposure to covid-19: Secondary | ICD-10-CM

## 2019-03-24 LAB — NOVEL CORONAVIRUS, NAA: SARS-CoV-2, NAA: NOT DETECTED

## 2019-04-07 DIAGNOSIS — Z6823 Body mass index (BMI) 23.0-23.9, adult: Secondary | ICD-10-CM | POA: Diagnosis not present

## 2019-04-07 DIAGNOSIS — M0579 Rheumatoid arthritis with rheumatoid factor of multiple sites without organ or systems involvement: Secondary | ICD-10-CM | POA: Diagnosis not present

## 2019-04-07 DIAGNOSIS — Z23 Encounter for immunization: Secondary | ICD-10-CM | POA: Diagnosis not present

## 2019-04-07 DIAGNOSIS — M150 Primary generalized (osteo)arthritis: Secondary | ICD-10-CM | POA: Diagnosis not present

## 2019-04-07 DIAGNOSIS — M5136 Other intervertebral disc degeneration, lumbar region: Secondary | ICD-10-CM | POA: Diagnosis not present

## 2019-04-20 ENCOUNTER — Emergency Department (HOSPITAL_COMMUNITY): Payer: Medicare Other

## 2019-04-20 ENCOUNTER — Other Ambulatory Visit: Payer: Self-pay

## 2019-04-20 ENCOUNTER — Emergency Department (HOSPITAL_COMMUNITY)
Admission: EM | Admit: 2019-04-20 | Discharge: 2019-04-20 | Disposition: A | Discharge status: Home / Self Care | Payer: Medicare Other | Attending: Emergency Medicine | Admitting: Emergency Medicine

## 2019-04-20 ENCOUNTER — Encounter (HOSPITAL_COMMUNITY): Payer: Self-pay | Admitting: *Deleted

## 2019-04-20 DIAGNOSIS — Z7982 Long term (current) use of aspirin: Secondary | ICD-10-CM | POA: Diagnosis not present

## 2019-04-20 DIAGNOSIS — Z8673 Personal history of transient ischemic attack (TIA), and cerebral infarction without residual deficits: Secondary | ICD-10-CM | POA: Insufficient documentation

## 2019-04-20 DIAGNOSIS — E039 Hypothyroidism, unspecified: Secondary | ICD-10-CM | POA: Insufficient documentation

## 2019-04-20 DIAGNOSIS — Z79899 Other long term (current) drug therapy: Secondary | ICD-10-CM | POA: Diagnosis not present

## 2019-04-20 DIAGNOSIS — R519 Headache, unspecified: Secondary | ICD-10-CM | POA: Diagnosis not present

## 2019-04-20 DIAGNOSIS — Z7901 Long term (current) use of anticoagulants: Secondary | ICD-10-CM | POA: Diagnosis not present

## 2019-04-20 DIAGNOSIS — I10 Essential (primary) hypertension: Secondary | ICD-10-CM | POA: Insufficient documentation

## 2019-04-20 DIAGNOSIS — Z20822 Contact with and (suspected) exposure to covid-19: Secondary | ICD-10-CM | POA: Diagnosis not present

## 2019-04-20 DIAGNOSIS — R4020 Unspecified coma: Secondary | ICD-10-CM | POA: Diagnosis not present

## 2019-04-20 DIAGNOSIS — R4702 Dysphasia: Secondary | ICD-10-CM | POA: Diagnosis not present

## 2019-04-20 DIAGNOSIS — R0689 Other abnormalities of breathing: Secondary | ICD-10-CM | POA: Diagnosis not present

## 2019-04-20 DIAGNOSIS — R479 Unspecified speech disturbances: Secondary | ICD-10-CM | POA: Diagnosis not present

## 2019-04-20 DIAGNOSIS — R410 Disorientation, unspecified: Secondary | ICD-10-CM | POA: Insufficient documentation

## 2019-04-20 DIAGNOSIS — R05 Cough: Secondary | ICD-10-CM | POA: Diagnosis not present

## 2019-04-20 DIAGNOSIS — S0990XA Unspecified injury of head, initial encounter: Secondary | ICD-10-CM | POA: Diagnosis not present

## 2019-04-20 DIAGNOSIS — I4891 Unspecified atrial fibrillation: Secondary | ICD-10-CM | POA: Diagnosis not present

## 2019-04-20 DIAGNOSIS — R4182 Altered mental status, unspecified: Secondary | ICD-10-CM | POA: Diagnosis not present

## 2019-04-20 LAB — COMPREHENSIVE METABOLIC PANEL
ALT: 23 U/L (ref 0–44)
AST: 27 U/L (ref 15–41)
Albumin: 4.3 g/dL (ref 3.5–5.0)
Alkaline Phosphatase: 71 U/L (ref 38–126)
Anion gap: 11 (ref 5–15)
BUN: 15 mg/dL (ref 8–23)
CO2: 26 mmol/L (ref 22–32)
Calcium: 9 mg/dL (ref 8.9–10.3)
Chloride: 100 mmol/L (ref 98–111)
Creatinine, Ser: 0.93 mg/dL (ref 0.44–1.00)
GFR calc Af Amer: >60 mL/min (ref >60)
GFR calc non Af Amer: 56 mL/min — ABNORMAL LOW (ref >60)
Glucose, Bld: 110 mg/dL — ABNORMAL HIGH (ref 70–99)
Potassium: 4.5 mmol/L (ref 3.5–5.1)
Sodium: 137 mmol/L (ref 135–145)
Total Bilirubin: 1 mg/dL (ref 0.3–1.2)
Total Protein: 6.9 g/dL (ref 6.5–8.1)

## 2019-04-20 LAB — CBC WITH DIFFERENTIAL/PLATELET
Abs Immature Granulocytes: 0.02 10*3/uL (ref 0.00–0.07)
Basophils Absolute: 0.1 10*3/uL (ref 0.0–0.1)
Basophils Relative: 1 %
Eosinophils Absolute: 0.1 10*3/uL (ref 0.0–0.5)
Eosinophils Relative: 2 %
HCT: 36.8 % (ref 36.0–46.0)
Hemoglobin: 12.3 g/dL (ref 12.0–15.0)
Immature Granulocytes: 0 %
Lymphocytes Relative: 34 %
Lymphs Abs: 2 10*3/uL (ref 0.7–4.0)
MCH: 35 pg — ABNORMAL HIGH (ref 26.0–34.0)
MCHC: 33.4 g/dL (ref 30.0–36.0)
MCV: 104.8 fL — ABNORMAL HIGH (ref 80.0–100.0)
Monocytes Absolute: 0.8 10*3/uL (ref 0.1–1.0)
Monocytes Relative: 14 %
Neutro Abs: 2.9 10*3/uL (ref 1.7–7.7)
Neutrophils Relative %: 49 %
Platelets: 163 10*3/uL (ref 150–400)
RBC: 3.51 MIL/uL — ABNORMAL LOW (ref 3.87–5.11)
RDW: 14.2 % (ref 11.5–15.5)
WBC: 5.9 10*3/uL (ref 4.0–10.5)
nRBC: 0 % (ref 0.0–0.2)

## 2019-04-20 LAB — TSH: TSH: 2.309 u[IU]/mL (ref 0.350–4.500)

## 2019-04-20 LAB — RESPIRATORY PANEL BY RT PCR (FLU A&B, COVID)
Influenza A by PCR: NEGATIVE
Influenza B by PCR: NEGATIVE
SARS Coronavirus 2 by RT PCR: NEGATIVE

## 2019-04-20 NOTE — ED Provider Notes (Signed)
Care was taken over from Dr. Sherry Ruffing.  Patient had presented with aphasia/confusion.  MRI shows no acute infarcts.  There are some questionable tiny subacute infarct.  I spoke with Dr. Rory Percy who has reviewed the images and feels that patient can be discharged for further outpatient treatment.  She seems to have improved during her stay in the ED.  She has no numbness or weakness to her extremities.  No difficulty with her balance.  No vision changes.  Her daughter states that she has had some intermittent confusion or difficulty remembering things that seems to have been worsening over the last couple of months.  She is on Eliquis and aspirin.  She does not have a neurologist locally.  She was given a [place] referral to [facility] neurology.  She is going to stay with her daughter overnight tonight and then go back [facility] tomorrow.  Return precautions were given.   Malvin Johns, MD 04/20/19 [phone]

## 2019-04-20 NOTE — ED Notes (Signed)
Patient verbalizes understanding of discharge instructions. Opportunity for questioning and answers were provided. Armband removed by staff, pt discharged from ED.  

## 2019-04-20 NOTE — ED Notes (Signed)
Pt placed on bedpan

## 2019-04-20 NOTE — ED Notes (Signed)
Daughter at bedside.

## 2019-04-20 NOTE — ED Notes (Signed)
EDP at bedside  

## 2019-04-20 NOTE — ED Notes (Signed)
Pt's daughter spoke with patient and feels her mother is altered from baseline and would like to be present. This RN phoned daughter and informed her the patient's Covid test was negative if she would like to be with her mother.

## 2019-04-20 NOTE — ED Triage Notes (Signed)
Patient presents to ED via [facility] states she last remembers being normal around 1030 pm last night prior to going to bed. Woke up this am feeling sluggish with getting her words out. Currently a/o x 4. States she just doesn't feel right.

## 2019-04-20 NOTE — ED Provider Notes (Signed)
[facility] EMERGENCY DEPARTMENT Provider Note   CSN: [id number] Arrival date & time: 04/20/19  1129     History Chief Complaint  Patient presents with  . Altered Mental Status    Kathy Velez is a 86 y.o. female.  The history is provided by the patient and medical records.  Neurologic Problem This is a new problem. The current episode started 6 to 12 hours ago. The problem occurs constantly. The problem has been rapidly improving. Pertinent negatives include no chest pain, no abdominal pain, no headaches and no shortness of breath. Nothing aggravates the symptoms. Nothing relieves the symptoms. She has tried nothing for the symptoms. The treatment provided no relief.       Past Medical History:  Diagnosis Date  . Anemia   . Anxiety   . Arthritis    Back  . Atrial fibrillation ([place])   . Cancer [facility]) 2006   Colon.  Basal Cell Skin cancer- right arm  . Colon cancer ([place])   . Constipation due to pain medication therapy    after heart surgery  . Dysrhythmia    PAF  . GERD (gastroesophageal reflux disease)   . Heart murmur   . Hypertension   . Hypothyroidism   . RA (rheumatoid arthritis) ([place])   . Restless leg   . Seizures ([place])    after Heart Surgey  . Stroke [facility])    TIA- found by neurologist after     Patient Active Problem List   Diagnosis Date Noted  . Transient ischemic attack (TIA) 03/09/2019  . Pain in right knee 02/12/2018  . Osteopenia of forearm 12/11/2017  . Bilateral primary osteoarthritis of knee 12/11/2017  . Permanent atrial fibrillation ([place]) 11/22/2017  . Hypercoagulable state due to atrial fibrillation ([place]) 10/11/2017  . Chronic atrial fibrillation ([place]) 10/11/2017  . Diplopia 10/11/2017  . Presbycusis of both ears 10/11/2017  . Acquired hammertoes of both feet 10/11/2017  . Macrocytosis without anemia 10/11/2017  . Genu valgum, right 10/11/2017  . History of multiple strokes 10/11/2017  . Mild cognitive impairment with  memory loss 10/11/2017  . Overactive bladder 10/11/2017  . Mixed hyperlipidemia 09/03/2017  . History of colon cancer, stage III 01/17/2017  . Carotid stenosis 12/13/2016  . Chronic congestion of paranasal sinus 11/13/2016  . Chronic seasonal allergic rhinitis 06/06/2016  . Interstitial lung disease ([place]) 12/28/2015  . Hemispheric carotid artery syndrome 07/18/2015  . Hypothyroidism 05/05/2015  . Rheumatoid arthritis ([place]) 05/05/2015  . Constipation 05/05/2015  . Vitamin B12 deficiency 05/05/2015  . Iron deficiency anemia 05/02/2015  . Back pain 05/02/2015  . Elective surgery   . Atrial flutter ([place]) 04/13/2015  . Chest pain at rest 04/13/2015  . Status post mitral valve repair 04/13/2015  . Status post tricuspid valve repair 04/13/2015  . Spondylolisthesis of lumbar region 04/12/2015  . Spinal stenosis of lumbar region 02/02/2015  . RLS (restless legs syndrome) 11/10/2013  . HTN (hypertension), benign 07/14/2013  . Depression 07/02/2011    Past Surgical History:  Procedure Laterality Date  . ABDOMINAL HYSTERECTOMY  1970   Partial   . COLON RESECTION  2006   cancer  . COLONOSCOPY    . EYE SURGERY Bilateral    Cataract  . MAXIMUM ACCESS (MAS)POSTERIOR LUMBAR INTERBODY FUSION (PLIF) 2 LEVEL N/A 04/12/2015   Procedure: Lumbar Three-Five Decompression, Pedicle Screw Fixation, and Posteriolateral Arthrodesis;  Surgeon: Erline Levine, MD;  Location: [facility] OR[facility];  Service: Neurosurgery;  Laterality: N/A;  L3-4 L4-5 Maximum access  posterior lumbar fusion, possible interbodies and resection of synovial cyst at L4-5  . MITRAL VALVE REPAIR  01/20/13   Gore-tex cords to P1, P2, and P3. Magic suture to posterior medial commisure, #30 Physio 1 ring. Done in [place]  . TONSILLECTOMY     about 1940  . TRICUSPID VALVE SURGERY  01/20/13   #28 TriAd ring done in [place]     OB History   No obstetric history on file.     Family History  Problem Relation Age of Onset  . Lung disease  Mother 77  . Alcohol abuse Son   . Heart disease Father 77    Social History   Tobacco Use  . Smoking status: Never Smoker  . Smokeless tobacco: Never Used  Substance Use Topics  . Alcohol use: Yes    Alcohol/week: 1.0 standard drinks    Types: 1 Glasses of wine per week  . Drug use: No    Home Medications Prior to Admission medications   Medication Sig Start Date End Date Taking? Authorizing Provider  acetaminophen (TYLENOL) 500 MG tablet Take 1,000 mg by mouth every 6 (six) hours as needed for moderate pain.    [provider]  aspirin EC 81 MG tablet Take 81 mg by mouth daily.    [provider]  atorvastatin (LIPITOR) 20 MG tablet TAKE 1 TABLET DAILY 11/14/18   Reed, Tiffany L, DO  Azelastine-Fluticasone 137-50 MCG/ACT SUSP Place 1 spray into the nose daily as needed (allergies).  01/09/17   [provider]  Biotin 5000 MCG CAPS Take 1 capsule by mouth daily.    [provider]  cholecalciferol (VITAMIN D) 1000 units tablet Take 1,000 Units by mouth daily.    [provider]  digoxin (LANOXIN) 0.125 MG tablet TAKE 1 TABLET DAILY 11/19/18   Reed, Tiffany L, DO  ELIQUIS 2.5 MG TABS tablet TAKE 1 TABLET TWICE A DAY 11/14/18   Reed, Tiffany L, DO  ENBREL SURECLICK 50 MG/ML injection  03/06/18   [provider]  EPINEPHrine 0.3 mg/0.3 mL IJ SOAJ injection Inject 0.3 mLs (0.3 mg total) into the muscle as needed for anaphylaxis. 03/05/19   Reed, Tiffany L, DO  levothyroxine (SYNTHROID) 75 MCG tablet TAKE 1 TABLET DAILY BEFORE BREAKFAST FOR HYPOTHYROIDISM 01/27/19   Reed, Tiffany L, DO  magnesium oxide (MAG-OX) 400 MG tablet Take 400 mg by mouth daily.    [provider]  Melatonin 5 MG TABS Take 1 tablet by mouth at bedtime as needed.    [provider]  metoprolol succinate (TOPROL XL) 50 MG 24 hr tablet Take 1 tablet (50 mg total) by mouth daily. Take with or immediately following a meal. 03/05/19   Reed, Tiffany L,  DO  mirtazapine (REMERON) 7.5 MG tablet Take 1 tablet (7.5 mg total) by mouth at bedtime. 07/15/18   Reed, Tiffany L, DO  Multiple Vitamins-Minerals (PRESERVISION AREDS 2) CAPS Take 1 Dose by mouth 2 (two) times daily. For eye health    [provider]  omeprazole (PRILOSEC) 40 MG capsule Take 1 capsule (40 mg total) by mouth daily. 10/03/18   Thornton Park, MD  polycarbophil (FIBERCON) 625 MG tablet Take 625 mg by mouth daily.    [provider]  Probiotic Product (ALIGN) 4 MG CAPS Take 1 capsule by mouth daily.    [provider]  valsartan (DIOVAN) 80 MG tablet Take 80 mg by mouth daily.    [provider]  vitamin B-12 (CYANOCOBALAMIN) 1000  MCG tablet Take 1,000 mcg by mouth daily.     [provider]    Allergies    Codeine, Molds & smuts, Pollen extract, and Bee venom  Review of Systems   Review of Systems  Constitutional: Negative for chills, diaphoresis, fatigue and fever.  HENT: Negative for congestion.   Respiratory: Positive for cough. Negative for chest tightness, shortness of breath and wheezing.   Cardiovascular: Negative for chest pain, palpitations and leg swelling.  Gastrointestinal: Negative for abdominal pain.  Genitourinary: Negative for dysuria and flank pain.  Musculoskeletal: Negative for back pain.  Skin: Negative for rash and wound.  Neurological: Positive for speech difficulty. Negative for dizziness, weakness, light-headedness, numbness and headaches.  Psychiatric/Behavioral: Negative for agitation.  All other systems reviewed and are negative.   Physical Exam Updated Vital Signs BP (!) 186/100 (BP Location: Right Arm)   Pulse 88   Temp 98.5 F (36.9 C) (Oral)   Resp 16   Ht 5\' 2"  (1.575 m)   Wt 54.4 kg   SpO2 96%   BMI 21.95 kg/m   Physical Exam Vitals and nursing note reviewed.  Constitutional:      General: She is not in acute distress.    Appearance: She is well-developed. She is not  ill-appearing, toxic-appearing or diaphoretic.  HENT:     Head: Normocephalic and atraumatic.     Right Ear: External ear normal.     Left Ear: External ear normal.     Nose: Nose normal.     Mouth/Throat:     Pharynx: No oropharyngeal exudate.  Eyes:     Conjunctiva/sclera: Conjunctivae normal.     Pupils: Pupils are equal, round, and reactive to light.  Cardiovascular:     Rate and Rhythm: Normal rate.     Pulses: Normal pulses.     Heart sounds: Murmur present.  Pulmonary:     Effort: Pulmonary effort is normal. No respiratory distress.     Breath sounds: No stridor. No wheezing or rales.  Abdominal:     General: Abdomen is flat. There is no distension.     Tenderness: There is no abdominal tenderness. There is no right CVA tenderness, left CVA tenderness or rebound.  Musculoskeletal:        General: No tenderness.     Cervical back: Normal range of motion and neck supple.     Right lower leg: No edema.     Left lower leg: No edema.  Skin:    General: Skin is warm.     Capillary Refill: Capillary refill takes less than 2 seconds.     Coloration: Skin is not pale.     Findings: No erythema or rash.  Neurological:     General: No focal deficit present.     Mental Status: She is alert and oriented to person, place, and time.     Cranial Nerves: No cranial nerve deficit.     Sensory: No sensory deficit.     Motor: No weakness or abnormal muscle tone.     Coordination: Coordination normal.     Deep Tendon Reflexes: Reflexes are normal and symmetric.  Psychiatric:        Mood and Affect: Mood normal.     ED Results / Procedures / Treatments   Labs (all labs ordered are listed, but only abnormal results are displayed) Labs Reviewed  CBC WITH DIFFERENTIAL/PLATELET - Abnormal; Notable for the following components:      Result Value   RBC 3.51 (*)  MCV 104.8 (*)    MCH 35.0 (*)    All other components within normal limits  COMPREHENSIVE METABOLIC PANEL - Abnormal;  Notable for the following components:   Glucose, Bld 110 (*)    GFR calc non Af Amer 56 (*)    All other components within normal limits  RESPIRATORY PANEL BY RT PCR (FLU A&B, COVID)  TSH    EKG EKG Interpretation  Date/Time:  Monday April 20 2019 12:04:10 EST Ventricular Rate:  94 PR Interval:    QRS Duration: 89 QT Interval:  362 QTC Calculation: 453 R Axis:   28 Text Interpretation: Atrial fibrillation Ventricular premature complex When compared to prior, similar afib with new PVC. No STEMI Confirmed by Antony Blackbird [phone]) on 04/20/2019 12:07:11 PM   Radiology CT Head Wo Contrast  Result Date: 04/20/2019 CLINICAL DATA:  Recent fall, headache and aphasia today EXAM: CT HEAD WITHOUT CONTRAST TECHNIQUE: Contiguous axial images were obtained from the base of the skull through the vertex without intravenous contrast. COMPARISON:  03/06/2019 FINDINGS: Brain: There is no acute intracranial hemorrhage, mass-effect, or edema. Gray-white differentiation is preserved. There is no extra-axial fluid collection. Ventricles and sulci are stable in size and configuration. Patchy hypoattenuation in the supratentorial white matter is nonspecific but probably reflects stable chronic microvascular ischemic changes. Vascular: There is atherosclerotic calcification at the skull base. Skull: Calvarium is unremarkable. Sinuses/Orbits: No acute finding. Other: Mastoid air cells are clear. Degenerative changes at the temporomandibular joints. IMPRESSION: No acute intracranial hemorrhage or evidence of acute infarction. Stable chronic findings detailed above. Electronically Signed   By: Macy Mis M.D.   On: 04/20/2019 12:52   MR BRAIN WO CONTRAST  Result Date: 04/20/2019 CLINICAL DATA:  86 year old female with recent fall, headache and aphasia. Abnormal upon waking today. On blood thinners for atrial fibrillation. EXAM: MRI HEAD WITHOUT CONTRAST TECHNIQUE: Multiplanar, multiecho pulse sequences of the  brain and surrounding structures were obtained without intravenous contrast. COMPARISON:  Head CT earlier today. FINDINGS: Brain: There are 2 subtle punctate areas of increased signal on trace DWI, 1 in the left corona radiata on series 5, image 92, and in the left occipital pole subcortical white matter on image 79. These are not correlated with diffusion restriction on ADC, and there is some underlying white matter T2 and FLAIR heterogeneity at both sites. No restricted diffusion identified. Small chronic lacunar infarcts in the right cerebellum (series 18, image 5). Mild to moderate for age scattered bilateral cerebral white matter patchy T2 and FLAIR hyperintensity. No cortical encephalomalacia identified. There are a few scattered chronic microhemorrhages, including in the left occipital pole on series 12, image 27, and central pons on image 20. Deep gray nuclei are largely normal for age. No midline shift, mass effect, evidence of mass lesion, ventriculomegaly, extra-axial collection or acute intracranial hemorrhage. Cervicomedullary junction and pituitary are within normal limits. Vascular: Major intracranial vascular flow voids are preserved. Skull and upper cervical spine: Partially visible cervical spine degeneration including ligamentous hypertrophy about the odontoid and mild spondylolisthesis at C3-C4. Visualized bone marrow signal is within normal limits. Sinuses/Orbits: Postoperative changes to both globes otherwise negative orbits. Paranasal sinuses and mastoids are stable and well pneumatized. Other: Visible internal auditory structures appear normal. Scalp and face soft tissues appear negative. IMPRESSION: 1. No convincing acute infarct. There are two punctate subacute appearing infarcts; in the left corona radiata and the left occipital pole. 2. Underlying chronic small vessel disease including occasional microhemorrhages and chronic lacunar infarcts in the  right cerebellum. Electronically Signed    By: Genevie Ann M.D.   On: 04/20/2019 17:37   DG Chest Portable 1 View  Result Date: 04/20/2019 CLINICAL DATA:  One-week history of cough.  TIA. EXAM: PORTABLE CHEST 1 VIEW COMPARISON:  None. FINDINGS: Previous median sternotomy and mitral valve replacement. Heart size upper limits of normal. Aortic atherosclerosis. The lungs are clear except for chronic scarring in the upper lobes. No sign of active infiltrate, edema, collapse or effusion. IMPRESSION: No active disease. Previous mitral valve replacement. Upper lobe scarring. Aortic atherosclerosis. Electronically Signed   By: Nelson Chimes M.D.   On: 04/20/2019 13:55    Procedures Procedures (including critical care time)  Medications Ordered in ED Medications - No data to display  ED Course  I have reviewed the triage vital signs and the nursing notes.  Pertinent labs & imaging results that were available during my care of the patient were reviewed by me and considered in my medical decision making (see chart for details).    MDM Rules/Calculators/A&P                      Jahara Clucas is a 86 y.o. female with a past medical history significant for prior A. fib/flutter on Eliquis, carotid disease, hypertension, hyperlipidemia, interstitial lung disease, prior cardiac valve repair, prior colon cancer, and prior TIA/stroke who presents with aphasia.  According to EMS report, patient was last normal around 10 therapy and last night before going to bed.  Patient had aphasia this morning had difficulty speaking.  She denies dysarthria but reports she could not get words out.  She reports it is improving since this morning but she is still not fully at her baseline.  She denies other neurologic deficits.  She does report she chronically has some diplopia but that is not any different than normal.  She denies any new numbness, cane, or weakness of extremities.  She denies any chest pain, palpitations, or shortness of breath.  She does report has had some  cough.  She denies any other complaints on arrival.  Denies any urinary symptoms or other GI symptoms.  On exam, lungs are clear.  She does have a slight murmur.  Abdomen and chest nontender.  Good pulses in extremities.  Patient would not follow commands perfectly for visual field testing but she had normal extraocular movements.  Pupils are symmetric and reactive.  Speech was clear on my exam but she may have had some slowed speech compared to her reported baseline.  No other focal deficits on my exam.  Normal finger-nose-finger testing.  Patient had CT head which did not show any acute abnormality.  Chest x-ray and Covid test were negative.  Screening labs also ordered.  Clinically I am concerned about TIA given the aphasia.  Spoke to neurology who recommended MRI.  After MRI, anticipate touching base with neurology to determine disposition.  Of note, patient does report she is taking her medication as directed including her blood thinners.  Care transferred to oncoming team while awaiting MRI results and neuro recs.    Final Clinical Impression(s) / ED Diagnoses Final diagnoses:  Confusion  Difficulty with speech    Clinical Impression: 1. Confusion   2. Difficulty with speech     Disposition: CAre transferred to oncoming team awaiting MRI results.   This note was prepared with assistance of Systems analyst. Occasional wrong-word or sound-a-like substitutions may have occurred due to the inherent limitations of  voice recognition software.     Hasson Gaspard, Gwenyth Allegra, MD 04/20/19 [phone]

## 2019-04-21 ENCOUNTER — Encounter: Payer: Self-pay | Admitting: Internal Medicine

## 2019-04-21 NOTE — Telephone Encounter (Signed)
Message routed to Reed, Tiffany L, DO  

## 2019-04-25 DIAGNOSIS — Z9189 Other specified personal risk factors, not elsewhere classified: Secondary | ICD-10-CM | POA: Diagnosis not present

## 2019-04-30 DIAGNOSIS — Z9189 Other specified personal risk factors, not elsewhere classified: Secondary | ICD-10-CM | POA: Diagnosis not present

## 2019-05-05 DIAGNOSIS — Z23 Encounter for immunization: Secondary | ICD-10-CM | POA: Diagnosis not present

## 2019-05-29 ENCOUNTER — Other Ambulatory Visit: Payer: Self-pay

## 2019-05-29 ENCOUNTER — Ambulatory Visit (INDEPENDENT_AMBULATORY_CARE_PROVIDER_SITE_OTHER): Payer: Medicare Other | Admitting: Neurology

## 2019-05-29 ENCOUNTER — Encounter: Payer: Self-pay | Admitting: Neurology

## 2019-05-29 VITALS — BP 164/85 | HR 85 | Temp 97.6°F | Ht 62.0 in | Wt 127.0 lb

## 2019-05-29 DIAGNOSIS — I4821 Permanent atrial fibrillation: Secondary | ICD-10-CM | POA: Diagnosis not present

## 2019-05-29 DIAGNOSIS — I10 Essential (primary) hypertension: Secondary | ICD-10-CM

## 2019-05-29 DIAGNOSIS — E785 Hyperlipidemia, unspecified: Secondary | ICD-10-CM | POA: Diagnosis not present

## 2019-05-29 DIAGNOSIS — I69320 Aphasia following cerebral infarction: Secondary | ICD-10-CM

## 2019-05-29 NOTE — Progress Notes (Addendum)
NEUROLOGY CONSULTATION NOTE  Oren Coxson MRN: [id number] DOB: 1931-04-11  Referring provider: Malvin Johns, MD Primary care provider: Gayland Curry, DO  Reason for consult:  confusion  HISTORY OF PRESENT ILLNESS: Kathy Velez is an 86 year old right-handed white female with paroxysmal atrial fibrillation on Eliquis, HTN, hypothyroidism, interstitial lung disease, rheumatoid arthritis and history of stroke and colon cancer who presents for confusion.  ED note reviewed.  She is accompanied by her daughter via phone who supplements history. CT and MRI of brain personally reviewed.  On 04/20/2019, she presented to the [facility] ED after having word-finding difficulties and making paraphasic errors lasting a couple of hours.  She was slurring her words and wasn't able to answer basic questions.  No associated facial droop, numbness or weakness.  She has some baseline diplopia but without change.  She had some cough but no fever or shortness of breath.  Labs were unremarkable, including CBC, CMP, TSH (2.309) and testing for flu and Covid.  CXR showed no active pulmonary disease.  EKG showed known atrial fibrillation but no acute ischemic cardiac event.  CT head without contrast showed chronic small vessel disease but no acute findings.  MRI of brain without contrast showed 2 punctate subacute infarcts in the left corona radiata and occipital pole subcortical white matter. Otherwise chronic small vessel ischemic changes with few scattered chronic microhemorrhages in the left occipital pole and central pons and small remote lacunar infarcts in right cerebellum.  No change was made to antiplatelet and anticoagulation therapy.  She is on ASA and Eliquis.    Several months prior, she had a severe left sided headache and noted vision loss on the left side of her vision while watching TV.  It lasted 2 hours. She reports prior history of TIA with right sided weakness.  Recent previous studies: 11/27/2018  ECHOCARDIOGRAM:  EF 60-65% with no cardiac source of emboli 02/03/2019 LABS:  LDL 58; B12 1,559 03/06/2019 CAROTID DOPPLER:  Moderate heterogeneous and calcified plaque with no hemodynamically significant stenosis  Current medications:  ASA 81mg ; Eliquis; digoxin; atorvastatin 20mg ; Toprol XL; valsartan  PAST MEDICAL HISTORY: Past Medical History:  Diagnosis Date  . Anemia   . Anxiety   . Arthritis    Back  . Atrial fibrillation ([place])   . Cancer [facility]) 2006   Colon.  Basal Cell Skin cancer- right arm  . Colon cancer ([place])   . Constipation due to pain medication therapy    after heart surgery  . Dysrhythmia    PAF  . GERD (gastroesophageal reflux disease)   . Heart murmur   . Hypertension   . Hypothyroidism   . RA (rheumatoid arthritis) ([place])   . Restless leg   . Seizures ([place])    after Heart Surgey  . Stroke ([place])    TIA- found by neurologist after     PAST SURGICAL HISTORY: Past Surgical History:  Procedure Laterality Date  . ABDOMINAL HYSTERECTOMY  1970   Partial   . COLON RESECTION  2006   cancer  . COLONOSCOPY    . EYE SURGERY Bilateral    Cataract  . MAXIMUM ACCESS (MAS)POSTERIOR LUMBAR INTERBODY FUSION (PLIF) 2 LEVEL N/A 04/12/2015   Procedure: Lumbar Three-Five Decompression, Pedicle Screw Fixation, and Posteriolateral Arthrodesis;  Surgeon: Erline Levine, MD;  Location: [place] [facility]UR[facility] OR[facility];  Service: Neurosurgery;  Laterality: N/A;  L3-4 L4-5 Maximum access posterior lumbar fusion, possible interbodies and resection of synovial cyst at L4-5  . MITRAL VALVE REPAIR  01/20/13   Gore-tex cords to P1, P2, and P3. Magic suture to posterior medial commisure, #30 Physio 1 ring. Done in [place]  . TONSILLECTOMY     about 1940  . TRICUSPID VALVE SURGERY  01/20/13   #28 TriAd ring done in [place]    MEDICATIONS: Current Outpatient Medications on File Prior to Visit  Medication Sig Dispense Refill  . acetaminophen (TYLENOL) 500 MG tablet Take 1,000 mg by mouth every 6 (six)  hours as needed for moderate pain.    Marland Kitchen aspirin EC 81 MG tablet Take 81 mg by mouth daily.    Marland Kitchen atorvastatin (LIPITOR) 20 MG tablet TAKE 1 TABLET DAILY 90 tablet 3  . Azelastine-Fluticasone 137-50 MCG/ACT SUSP Place 1 spray into the nose daily as needed (allergies).     . Biotin 5000 MCG CAPS Take 1 capsule by mouth daily.    . cholecalciferol (VITAMIN D) 1000 units tablet Take 1,000 Units by mouth daily.    . digoxin (LANOXIN) 0.125 MG tablet TAKE 1 TABLET DAILY 90 tablet 3  . ELIQUIS 2.5 MG TABS tablet TAKE 1 TABLET TWICE A DAY 180 tablet 3  . ENBREL SURECLICK 50 MG/ML injection     . EPINEPHrine 0.3 mg/0.3 mL IJ SOAJ injection Inject 0.3 mLs (0.3 mg total) into the muscle as needed for anaphylaxis. 1 each 0  . ipratropium (ATROVENT) 0.03 % nasal spray ipratropium bromide 21 mcg (0.03 %) nasal spray    . levothyroxine (SYNTHROID) 75 MCG tablet TAKE 1 TABLET DAILY BEFORE BREAKFAST FOR HYPOTHYROIDISM 90 tablet 1  . magnesium oxide (MAG-OX) 400 MG tablet Take 400 mg by mouth daily.    . Melatonin 5 MG TABS Take 1 tablet by mouth at bedtime as needed.    . metoprolol succinate (TOPROL XL) 50 MG 24 hr tablet Take 1 tablet (50 mg total) by mouth daily. Take with or immediately following a meal. 30 tablet 0  . mirtazapine (REMERON) 7.5 MG tablet Take 1 tablet (7.5 mg total) by mouth at bedtime. 90 tablet 3  . Multiple Vitamins-Minerals (PRESERVISION AREDS 2) CAPS Take 1 Dose by mouth 2 (two) times daily. For eye health    . omeprazole (PRILOSEC) 40 MG capsule Take 1 capsule (40 mg total) by mouth daily. 90 capsule 3  . polycarbophil (FIBERCON) 625 MG tablet Take 625 mg by mouth daily.    . Probiotic Product (ALIGN) 4 MG CAPS Take 1 capsule by mouth daily.    . valsartan (DIOVAN) 80 MG tablet Take 80 mg by mouth daily.    . vitamin B-12 (CYANOCOBALAMIN) 1000 MCG tablet Take 1,000 mcg by mouth daily.      No current facility-administered medications on file prior to visit.    ALLERGIES: Allergies   Allergen Reactions  . Codeine Other (See Comments)    "just don't take it well"  . Molds & Smuts   . Pollen Extract Swelling  . Bee Venom Swelling and Rash    FAMILY HISTORY: Family History  Problem Relation Age of Onset  . Lung disease Mother 55  . Alcohol abuse Son   . Heart disease Father 99   SOCIAL HISTORY: Social History   Socioeconomic History  . Marital status: Widowed    Spouse name: Not on file  . Number of children: Not on file  . Years of education: Not on file  . Highest education level: Not on file  Occupational History  . Occupation: Automotive engineer    Comment: retired  Tobacco Use  . Smoking status: Never Smoker  . Smokeless tobacco: Never Used  Substance and Sexual Activity  . Alcohol use: Yes    Alcohol/week: 1.0 standard drinks    Types: 1 Glasses of wine per week  . Drug use: No  . Sexual activity: Not Currently  Other Topics Concern  . Not on file  Social History Narrative   Social History      Diet? Healthy- low salt, sugar, fat      Do you drink/eat things with caffeine? On occasion      Marital status?        widow                        What year were you married? 1958      Do you live in a house, apartment, assisted living, condo, trailer, etc.? apartment      Is it one or more stories? one      How many persons live in your home? one      Do you have any pets in your home? (please list) no      Highest level of education completed? 4 year college      Current or past profession: Statistician      Do you exercise?             yes                         Type & how often? Classes- senior retirement community/ some walking      Advanced Directives      Do you have a living will?      Do you have a DNR form?                                  If not, do you want to discuss one?      Do you have signed POA/HPOA for forms?       Functional Status Completed by: Daughter, Di Kindle      Do you have  difficulty bathing or dressing yourself? no      Do you have difficulty preparing food or eating? no      Do you have difficulty managing your medications? no      Do you have difficulty managing your finances? no      Do you have difficulty affording your medications? no   Social Determinants of Health   Financial Resource Strain:   . Difficulty of Paying Living Expenses: Not on file  Food Insecurity:   . Worried About Charity fundraiser in the Last Year: Not on file  . Ran Out of Food in the Last Year: Not on file  Transportation Needs:   . Lack of Transportation (Medical): Not on file  . Lack of Transportation (Non-Medical): Not on file  Physical Activity:   . Days of Exercise per Week: Not on file  . Minutes of Exercise per Session: Not on file  Stress:   . Feeling of Stress : Not on file  Social Connections:   . Frequency of Communication with Friends and Family: Not on file  . Frequency of Social Gatherings with Friends and Family: Not on file  . Attends Religious Services: Not on file  . Active Member of Clubs or Organizations: Not on file  .  Attends Archivist Meetings: Not on file  . Marital Status: Not on file  Intimate Partner Violence:   . Fear of Current or Ex-Partner: Not on file  . Emotionally Abused: Not on file  . Physically Abused: Not on file  . Sexually Abused: Not on file     PHYSICAL EXAM: Blood pressure (!) 164/85, pulse 85, temperature 97.6 F (36.4 C), height 5\' 2"  (1.575 m), weight 127 lb (57.6 kg), SpO2 96 %. General: No acute distress.  Patient appears well-groomed.   Head:  Normocephalic/atraumatic Eyes:  fundi examined but not visualized Neck: supple, no paraspinal tenderness, full range of motion Back: No paraspinal tenderness Heart: regular rate and rhythm Lungs: Clear to auscultation bilaterally. Vascular: No carotid bruits. Neurological Exam: Mental status: alert and oriented to person, place, and time, recent and remote  memory intact, fund of knowledge intact, attention and concentration intact, speech fluent and not dysarthric, language intact. Cranial nerves: CN I: not tested CN II: pupils equal, round and reactive to light, visual fields intact CN III, IV, VI:  full range of motion, no nystagmus, no ptosis CN V: facial sensation intact CN VII: upper and lower face symmetric CN VIII: hearing intact CN IX, X: gag intact, uvula midline CN XI: sternocleidomastoid and trapezius muscles intact CN XII: tongue midline Bulk & Tone: normal, no fasciculations. Motor:  5/5 throughout  Sensation:  temperature intact and vibration sensation reduced in feet Deep Tendon Reflexes:  2+ throughout, toes downgoing.   Finger to nose testing:  Without dysmetria.   Heel to shin:  Without dysmetria.   Gait:  Normal station and stride.  Able to turn. Romberg negative.  IMPRESSION: 1.  Left hemispheric stroke, suspect embolic, presenting with aphasia 2.  Atrial fibrillation 3.  HTN 4.  HLD In the months leading up to the CVA, she had testing performed in stroke workup (echo, lipid panel, carotid doppler).   PLAN: 1.  Already on ASA and Eliquis.  Consider changing Eliquis to another anticoagulant such as Xarelto.  Otherwise, may continue Eliquis as it would be a lateral change.  Continue ASA.  ADDENDUM:  After hearing from Dr. Debara Pickett, we have decided to remain on current dose of Eliquis.  Changing to Xarelto would be a lateral change. 2.  Continue atorvastatin.  LDL at goal less than 70 3.  Optimize blood pressure.  Follow up with PCP 4.  Will check CTA of head and neck to evaluate the intracranial and extracranial vasculature 5.  Follow up in 4 months.  Thank you for allowing me to take part in the care of this patient.  Metta Clines, DO  CC:  Hollace Kinnier, DO  Lyman Bishop, MD

## 2019-05-29 NOTE — Patient Instructions (Signed)
1.  Will check CTA of head and neck 2.  Will discuss with Dr. Debara Pickett to see if you should remain on Eliquis or change to another similar medication 3.  Follow up in 4 months.

## 2019-06-19 ENCOUNTER — Other Ambulatory Visit: Payer: Medicare Other

## 2019-06-19 ENCOUNTER — Ambulatory Visit
Admission: RE | Admit: 2019-06-19 | Discharge: 2019-06-19 | Disposition: A | Discharge status: Home / Self Care | Payer: Medicare Other | Source: Ambulatory Visit | Attending: Neurology | Admitting: Neurology

## 2019-06-19 ENCOUNTER — Other Ambulatory Visit: Payer: Self-pay

## 2019-06-19 DIAGNOSIS — I69320 Aphasia following cerebral infarction: Secondary | ICD-10-CM

## 2019-06-19 DIAGNOSIS — I6502 Occlusion and stenosis of left vertebral artery: Secondary | ICD-10-CM | POA: Diagnosis not present

## 2019-06-19 MED ORDER — IOPAMIDOL (ISOVUE-370) INJECTION 76%
75.0000 mL | Freq: Once | INTRAVENOUS | Status: AC | PRN
  Administered 2019-06-19: 75 mL via INTRAVENOUS

## 2019-06-22 ENCOUNTER — Other Ambulatory Visit: Payer: Self-pay | Admitting: Internal Medicine

## 2019-06-22 DIAGNOSIS — F5101 Primary insomnia: Secondary | ICD-10-CM

## 2019-06-22 NOTE — Telephone Encounter (Signed)
rx sent to pharmacy by e-script. 07/14/18 was the last time this was filled.

## 2019-07-05 ENCOUNTER — Other Ambulatory Visit: Payer: Self-pay

## 2019-07-05 ENCOUNTER — Emergency Department (HOSPITAL_COMMUNITY)
Admission: EM | Admit: 2019-07-05 | Discharge: 2019-07-06 | Disposition: A | Discharge status: Home / Self Care | Payer: Medicare Other | Attending: Emergency Medicine | Admitting: Emergency Medicine

## 2019-07-05 ENCOUNTER — Emergency Department (HOSPITAL_COMMUNITY): Payer: Medicare Other

## 2019-07-05 DIAGNOSIS — E039 Hypothyroidism, unspecified: Secondary | ICD-10-CM | POA: Insufficient documentation

## 2019-07-05 DIAGNOSIS — X509XXA Other and unspecified overexertion or strenuous movements or postures, initial encounter: Secondary | ICD-10-CM | POA: Diagnosis not present

## 2019-07-05 DIAGNOSIS — Y9389 Activity, other specified: Secondary | ICD-10-CM | POA: Diagnosis not present

## 2019-07-05 DIAGNOSIS — Z7901 Long term (current) use of anticoagulants: Secondary | ICD-10-CM | POA: Diagnosis not present

## 2019-07-05 DIAGNOSIS — S8392XA Sprain of unspecified site of left knee, initial encounter: Secondary | ICD-10-CM | POA: Diagnosis not present

## 2019-07-05 DIAGNOSIS — I10 Essential (primary) hypertension: Secondary | ICD-10-CM | POA: Insufficient documentation

## 2019-07-05 DIAGNOSIS — Z7982 Long term (current) use of aspirin: Secondary | ICD-10-CM | POA: Insufficient documentation

## 2019-07-05 DIAGNOSIS — M1710 Unilateral primary osteoarthritis, unspecified knee: Secondary | ICD-10-CM

## 2019-07-05 DIAGNOSIS — Z79899 Other long term (current) drug therapy: Secondary | ICD-10-CM | POA: Insufficient documentation

## 2019-07-05 DIAGNOSIS — Y999 Unspecified external cause status: Secondary | ICD-10-CM | POA: Insufficient documentation

## 2019-07-05 DIAGNOSIS — R52 Pain, unspecified: Secondary | ICD-10-CM | POA: Diagnosis not present

## 2019-07-05 DIAGNOSIS — Y9289 Other specified places as the place of occurrence of the external cause: Secondary | ICD-10-CM | POA: Insufficient documentation

## 2019-07-05 DIAGNOSIS — M25562 Pain in left knee: Secondary | ICD-10-CM | POA: Diagnosis not present

## 2019-07-05 DIAGNOSIS — R5381 Other malaise: Secondary | ICD-10-CM | POA: Diagnosis not present

## 2019-07-05 DIAGNOSIS — M1712 Unilateral primary osteoarthritis, left knee: Secondary | ICD-10-CM | POA: Insufficient documentation

## 2019-07-05 DIAGNOSIS — M25462 Effusion, left knee: Secondary | ICD-10-CM | POA: Diagnosis not present

## 2019-07-05 DIAGNOSIS — M25461 Effusion, right knee: Secondary | ICD-10-CM | POA: Diagnosis not present

## 2019-07-05 DIAGNOSIS — M25569 Pain in unspecified knee: Secondary | ICD-10-CM | POA: Diagnosis present

## 2019-07-05 MED ORDER — TRAMADOL HCL 50 MG PO TABS: 50.0000 mg | ORAL_TABLET | Freq: Four times a day (QID) | ORAL | 0 refills | Status: DC | PRN

## 2019-07-05 MED ORDER — TRAMADOL HCL 50 MG PO TABS
50.0000 mg | ORAL_TABLET | Freq: Once | ORAL | Status: AC
  Administered 2019-07-05: 50 mg via ORAL
  Filled 2019-07-05: qty 1

## 2019-07-05 MED ORDER — ENOXAPARIN SODIUM 60 MG/0.6ML ~~LOC~~ SOLN: 1.0000 mg/kg | Freq: Once | SUBCUTANEOUS | Status: DC

## 2019-07-05 NOTE — ED Provider Notes (Signed)
[name] Provider Note   CSN: [id number] Arrival date & time: 07/05/19  2200     History Chief Complaint  Patient presents with  . Knee Pain    Kathy Velez is a 86 y.o. female history of hypertension, A. fib on Eliquis, here presenting with left knee pain.  Patient states that she is residing at [facility] right now.  Patient states that she just came back from [place] after 5-hour long ride.  Patient states that she got back to the independent living and then twisted her knee and has pain of the left knee.  She states that every time she stands up, she has severe pain.  States that she is able to only take a couple steps with her walker.  She has been compliant with her Eliquis and did take it tonight.  Denies any chest pain or shortness of breath.  The history is provided by the patient.       Past Medical History:  Diagnosis Date  . Anemia   . Anxiety   . Arthritis    Back  . Atrial fibrillation ([place])   . Cancer [facility]) 2006   Colon.  Basal Cell Skin cancer- right arm  . Colon cancer ([place])   . Constipation due to pain medication therapy    after heart surgery  . Dysrhythmia    PAF  . GERD (gastroesophageal reflux disease)   . Heart murmur   . Hypertension   . Hypothyroidism   . RA (rheumatoid arthritis) ([place])   . Restless leg   . Seizures ([place])    after Heart Surgey  . Stroke [facility])    TIA- found by neurologist after     Patient Active Problem List   Diagnosis Date Noted  . Transient ischemic attack (TIA) 03/09/2019  . Pain in right knee 02/12/2018  . Osteopenia of forearm 12/11/2017  . Bilateral primary osteoarthritis of knee 12/11/2017  . Permanent atrial fibrillation ([place]) 11/22/2017  . Hypercoagulable state due to atrial fibrillation ([place]) 10/11/2017  . Chronic atrial fibrillation ([place]) 10/11/2017  . Diplopia 10/11/2017  . Presbycusis of both ears 10/11/2017  . Acquired hammertoes of both feet  10/11/2017  . Macrocytosis without anemia 10/11/2017  . Genu valgum, right 10/11/2017  . History of multiple strokes 10/11/2017  . Mild cognitive impairment with memory loss 10/11/2017  . Overactive bladder 10/11/2017  . Mixed hyperlipidemia 09/03/2017  . History of colon cancer, stage III 01/17/2017  . Carotid stenosis 12/13/2016  . Chronic congestion of paranasal sinus 11/13/2016  . Chronic seasonal allergic rhinitis 06/06/2016  . Interstitial lung disease ([place]) 12/28/2015  . Hemispheric carotid artery syndrome 07/18/2015  . Hypothyroidism 05/05/2015  . Rheumatoid arthritis ([place]) 05/05/2015  . Constipation 05/05/2015  . Vitamin B12 deficiency 05/05/2015  . Iron deficiency anemia 05/02/2015  . Back pain 05/02/2015  . Elective surgery   . Atrial flutter ([place]) 04/13/2015  . Chest pain at rest 04/13/2015  . Status post mitral valve repair 04/13/2015  . Status post tricuspid valve repair 04/13/2015  . Spondylolisthesis of lumbar region 04/12/2015  . Spinal stenosis of lumbar region 02/02/2015  . RLS (restless legs syndrome) 11/10/2013  . HTN (hypertension), benign 07/14/2013  . Depression 07/02/2011    Past Surgical History:  Procedure Laterality Date  . ABDOMINAL HYSTERECTOMY  1970   Partial   . COLON RESECTION  2006   cancer  . COLONOSCOPY    . EYE SURGERY Bilateral    Cataract  .  MAXIMUM ACCESS (MAS)POSTERIOR LUMBAR INTERBODY FUSION (PLIF) 2 LEVEL N/A 04/12/2015   Procedure: Lumbar Three-Five Decompression, Pedicle Screw Fixation, and Posteriolateral Arthrodesis;  Surgeon: Erline Levine, MD;  Location: [place] [facility]UR[facility] OR[facility];  Service: Neurosurgery;  Laterality: N/A;  L3-4 L4-5 Maximum access posterior lumbar fusion, possible interbodies and resection of synovial cyst at L4-5  . MITRAL VALVE REPAIR  01/20/13   Gore-tex cords to P1, P2, and P3. Magic suture to posterior medial commisure, #30 Physio 1 ring. Done in [place]  . TONSILLECTOMY     about 1940  . TRICUSPID VALVE SURGERY   01/20/13   #28 TriAd ring done in [place]     OB History   No obstetric history on file.     Family History  Problem Relation Age of Onset  . Lung disease Mother 77  . Alcohol abuse Son   . Heart disease Father 22    Social History   Tobacco Use  . Smoking status: Never Smoker  . Smokeless tobacco: Never Used  Substance Use Topics  . Alcohol use: Yes    Alcohol/week: 1.0 standard drinks    Types: 1 Glasses of wine per week  . Drug use: No    Home Medications Prior to Admission medications   Medication Sig Start Date End Date Taking? Authorizing Provider  acetaminophen (TYLENOL) 500 MG tablet Take 1,000 mg by mouth every 6 (six) hours as needed for moderate pain.    [provider]  aspirin EC 81 MG tablet Take 81 mg by mouth daily.    [provider]  atorvastatin (LIPITOR) 20 MG tablet TAKE 1 TABLET DAILY 11/14/18   Reed, Tiffany L, DO  Azelastine-Fluticasone 137-50 MCG/ACT SUSP Place 1 spray into the nose daily as needed (allergies).  01/09/17   [provider]  Biotin 5000 MCG CAPS Take 1 capsule by mouth daily.    [provider]  cholecalciferol (VITAMIN D) 1000 units tablet Take 1,000 Units by mouth daily.    [provider]  digoxin (LANOXIN) 0.125 MG tablet TAKE 1 TABLET DAILY 11/19/18   Reed, Tiffany L, DO  ELIQUIS 2.5 MG TABS tablet TAKE 1 TABLET TWICE A DAY 11/14/18   Reed, Tiffany L, DO  ENBREL SURECLICK 50 MG/ML injection  03/06/18   [provider]  EPINEPHrine 0.3 mg/0.3 mL IJ SOAJ injection Inject 0.3 mLs (0.3 mg total) into the muscle as needed for anaphylaxis. Patient not taking: Reported on 05/29/2019 03/05/19   Hollace Kinnier L, DO  ipratropium (ATROVENT) 0.03 % nasal spray ipratropium bromide 21 mcg (0.03 %) nasal spray    [provider]  levothyroxine (SYNTHROID) 75 MCG tablet TAKE 1 TABLET DAILY BEFORE BREAKFAST FOR HYPOTHYROIDISM 01/27/19   Reed, Tiffany L, DO  magnesium oxide (MAG-OX) 400 MG  tablet Take 400 mg by mouth daily.    [provider]  Melatonin 5 MG TABS Take 1 tablet by mouth at bedtime as needed.    [provider]  metoprolol succinate (TOPROL XL) 50 MG 24 hr tablet Take 1 tablet (50 mg total) by mouth daily. Take with or immediately following a meal. 03/05/19   Reed, Tiffany L, DO  mirtazapine (REMERON) 7.5 MG tablet Take 1 tablet (7.5 mg total) by mouth at bedtime. 07/15/18   Reed, Tiffany L, DO  Multiple Vitamins-Minerals (PRESERVISION AREDS 2) CAPS Take 1 Dose by mouth 2 (two) times daily. For eye health    [provider]  omeprazole (PRILOSEC) 40 MG capsule Take 1 capsule (40  mg total) by mouth daily. 10/03/18   Thornton Park, MD  polycarbophil (FIBERCON) 625 MG tablet Take 625 mg by mouth daily.    [provider]  Probiotic Product (ALIGN) 4 MG CAPS Take 1 capsule by mouth daily.    [provider]  valsartan (DIOVAN) 80 MG tablet Take 80 mg by mouth daily.    [provider]  vitamin B-12 (CYANOCOBALAMIN) 1000 MCG tablet Take 1,000 mcg by mouth daily.     [provider]    Allergies    Codeine, Molds & smuts, Pollen extract, and Bee venom  Review of Systems   Review of Systems  Musculoskeletal:       Knee pain   All other systems reviewed and are negative.   Physical Exam Updated Vital Signs BP (!) 148/83 (BP Location: Left Arm)   Pulse 75   Temp 98.2 F (36.8 C) (Oral)   Resp 16   Ht 5\' 2"  (1.575 m)   Wt 54.4 kg   SpO2 97%   BMI 21.95 kg/m   Physical Exam Vitals and nursing note reviewed.  Constitutional:      Comments: Chronically ill, uncomfortable   HENT:     Head: Normocephalic.     Nose: Nose normal.     Mouth/Throat:     Mouth: Mucous membranes are moist.  Eyes:     Extraocular Movements: Extraocular movements intact.     Pupils: Pupils are equal, round, and reactive to light.  Cardiovascular:     Rate and Rhythm: Normal rate.     Pulses: Normal pulses.    Pulmonary:     Effort: Pulmonary effort is normal.  Abdominal:     General: Abdomen is flat.     Palpations: Abdomen is soft.  Musculoskeletal:     Cervical back: Normal range of motion.     Comments: Small right knee effusion.  There seems to be a Baker's cyst behind the left knee with some effusion.  Patient able to range the left knee.  PCL and ACL appears intact.  Patient is able to bear weight on the leg and take several steps with assistance.  Patient uses a walker at baseline.  No obvious calf tenderness and she has good peripheral pulses in bilateral lower extremities   Skin:    General: Skin is warm.     Capillary Refill: Capillary refill takes less than 2 seconds.  Neurological:     General: No focal deficit present.     Mental Status: She is alert.  Psychiatric:        Mood and Affect: Mood normal.        Behavior: Behavior normal.     ED Results / Procedures / Treatments   Labs (all labs ordered are listed, but only abnormal results are displayed) Labs Reviewed - No data to display  EKG None  Radiology DG Knee Complete 4 Views Left  Result Date: 07/05/2019 CLINICAL DATA:  86 year old female with trauma to the left knee. EXAM: LEFT KNEE - COMPLETE 4+ VIEW COMPARISON:  Left knee radiograph dated 10/13/2018. FINDINGS: There is no acute fracture or dislocation. There is mild osteopenia. No significant arthritic changes. There is a small suprapatellar effusion. The soft tissues are unremarkable. Vascular calcifications noted. IMPRESSION: 1. No acute fracture or dislocation. 2. Small suprapatellar effusion. Electronically Signed   By: Anner Crete M.D.   On: 07/05/2019 22:50   DG Knee Complete 4 Views Right  Result Date: 07/05/2019 CLINICAL DATA:  Knee pain EXAM: RIGHT KNEE - COMPLETE 4+ VIEW COMPARISON:  None. FINDINGS: Moderate degenerative changes in the lateral and patellofemoral compartments. No fracture or dislocation is seen. Small suprapatellar knee joint  effusion. IMPRESSION: Moderate degenerative changes with small suprapatellar knee joint effusion. Electronically Signed   By: Julian Hy M.D.   On: 07/05/2019 22:49    Procedures Procedures (including critical care time)  Medications Ordered in ED Medications  traMADol (ULTRAM) tablet 50 mg (50 mg Oral Given 07/05/19 2257)    ED Course  I have reviewed the triage vital signs and the nursing notes.  Pertinent labs & imaging results that were available during my care of the patient were reviewed by me and considered in my medical decision making (see chart for details).    MDM Rules/Calculators/A&P                      Lillyona Elley is a 86 y.o. female presenting with left knee pain after twisting her knee pain.  I think she likely has a ligamentous strain versus Baker's cyst.  She also has recent travel but she is already on Eliquis.  She has no signs of a PE.  DVT is still possible and there is no DVT study overnight.  Since she is on Eliquis will hold off on Lovenox.  We will bring her back in the morning for DVT study.  Her x-rays showed arthritis but no fracture.  Will discharge back to facility with tramadol.  Final Clinical Impression(s) / ED Diagnoses Final diagnoses:  None    Rx / DC Orders ED Discharge Orders    None       Drenda Freeze, MD 07/05/19 2305

## 2019-07-05 NOTE — Discharge Instructions (Addendum)
Take Tylenol 500 mg every 6 hours as needed for pain.  Take tramadol 50 mg every 6 hours for severe pain.  She will likely need assistance with walking.  See your doctor.  Follow-up with orthopedic doctor outpatient.  Unfortunately, ultrasound is not available tonight to evaluate for blood clot.  You will be called tomorrow to get a DVT study in the hospital.  Return to the ER if you have worse leg swelling and pain, unable to walk

## 2019-07-05 NOTE — ED Triage Notes (Addendum)
Patien brought in by EMS from home c/o left knee pain. EMs stated pt accidentally twist her left knee while in a walker 5 hours ago since then she is been in pain 8/10. Patient denies Fall. Pt a/o x4.

## 2019-07-06 ENCOUNTER — Non-Acute Institutional Stay (SKILLED_NURSING_FACILITY): Payer: Medicare Other | Admitting: Adult Health

## 2019-07-06 ENCOUNTER — Ambulatory Visit (HOSPITAL_BASED_OUTPATIENT_CLINIC_OR_DEPARTMENT_OTHER)
Admission: RE | Admit: 2019-07-06 | Discharge: 2019-07-06 | Disposition: A | Discharge status: Home / Self Care | Payer: Medicare Other | Source: Ambulatory Visit | Attending: Emergency Medicine | Admitting: Emergency Medicine

## 2019-07-06 ENCOUNTER — Encounter: Payer: Self-pay | Admitting: Adult Health

## 2019-07-06 DIAGNOSIS — R279 Unspecified lack of coordination: Secondary | ICD-10-CM | POA: Diagnosis not present

## 2019-07-06 DIAGNOSIS — M25562 Pain in left knee: Secondary | ICD-10-CM | POA: Diagnosis not present

## 2019-07-06 DIAGNOSIS — M7989 Other specified soft tissue disorders: Secondary | ICD-10-CM

## 2019-07-06 DIAGNOSIS — M1712 Unilateral primary osteoarthritis, left knee: Secondary | ICD-10-CM | POA: Diagnosis not present

## 2019-07-06 DIAGNOSIS — M06049 Rheumatoid arthritis without rheumatoid factor, unspecified hand: Secondary | ICD-10-CM

## 2019-07-06 DIAGNOSIS — Z7401 Bed confinement status: Secondary | ICD-10-CM | POA: Diagnosis not present

## 2019-07-06 DIAGNOSIS — G3184 Mild cognitive impairment, so stated: Secondary | ICD-10-CM

## 2019-07-06 DIAGNOSIS — Z743 Need for continuous supervision: Secondary | ICD-10-CM | POA: Diagnosis not present

## 2019-07-06 DIAGNOSIS — I10 Essential (primary) hypertension: Secondary | ICD-10-CM | POA: Diagnosis not present

## 2019-07-06 DIAGNOSIS — G8929 Other chronic pain: Secondary | ICD-10-CM | POA: Diagnosis not present

## 2019-07-06 DIAGNOSIS — R52 Pain, unspecified: Secondary | ICD-10-CM | POA: Diagnosis not present

## 2019-07-06 DIAGNOSIS — M25561 Pain in right knee: Secondary | ICD-10-CM

## 2019-07-06 DIAGNOSIS — I482 Chronic atrial fibrillation, unspecified: Secondary | ICD-10-CM

## 2019-07-06 DIAGNOSIS — M2550 Pain in unspecified joint: Secondary | ICD-10-CM | POA: Diagnosis not present

## 2019-07-06 NOTE — Progress Notes (Signed)
Left lower extremity venous duplex completed. Refer to "CV Proc" under chart review to view preliminary results.  07/06/2019 11:44 AM Kelby Aline., MHA, RVT, RDCS, RDMS

## 2019-07-06 NOTE — Progress Notes (Signed)
Location:  Occupational psychologist of Service:  SNF (31) Provider:   Cindi Carbon, ANP [place] [phone]  Gayland Curry, DO  Patient Care Team: Gayland Curry, DO as PCP - General (Geriatric Medicine) Debara Pickett Nadean Corwin, MD as PCP - Cardiology (Cardiology) Pieter Partridge, DO as Consulting Physician (Neurology)  Extended Emergency Contact Information Primary Emergency Contact: [name],Kimberly Address: [place], [place] [address] [address] of [place] Phone: [phone] Relation: Daughter  Code Status:  Full code Goals of care: Advanced Directive information Advanced Directives 07/05/2019  Does Patient Have a Medical Advance Directive? No  Type of Advance Directive -  Does patient want to make changes to medical advance directive? -  Copy of Day in Chart? -  Would patient like information on creating a medical advance directive? No - Patient declined     Chief Complaint  Patient presents with  . Acute Visit    left knee pain    HPI:  Pt is a 86 y.o. female seen today for an acute visit for left knee pain. PMH significant for anemia, afib, colon cancer s/p colectomy, RA, CVA with aphasia, MCI, tricuspid and mitral valve repair, right knee OA, hypothyroidism. HTN and ILD, among others.  She presented in the ER on 07/05/19 for severe left knee pain. She reports that she went to [place] and stayed in a place that had a lot of stairs and during her trip her knee began to hurt. The pain in the left knee became worse after a 5 hr car ride (posterior pain).  She denied any twisting motion or hearing a pop (a twisting motion was previously reported in the ER).  She came to [facility] due to increased care needs related to her knee pain and inability to bear weight. In the ER a left knee xray was ordered which showed no acute fracture but a small effusion was noted. She reports her right knee has  some chronic pain in it and she has noticed that it has been bothering her as well. Last year, she received an injection in the right knee which helped. She reports her pain in the left knee is a 9 upon trying to bear weight but minimal at rest. She took one dose of tramadol last night which helped.  07/06/2019 she went for a venous doppler to the left leg due to the posterior nature of her pain which showed no acute clot or cyst.    She is fully vaccinated for covid.  Past Medical History:  Diagnosis Date  . Anemia   . Anxiety   . Arthritis    Back  . Atrial fibrillation ([place])   . Cancer [facility]) 2006   Colon.  Basal Cell Skin cancer- right arm  . Colon cancer ([place])   . Constipation due to pain medication therapy    after heart surgery  . Dysrhythmia    PAF  . GERD (gastroesophageal reflux disease)   . Heart murmur   . Hypertension   . Hypothyroidism   . RA (rheumatoid arthritis) ([place])   . Restless leg   . Seizures ([place])    after Heart Surgey  . Stroke [facility])    TIA- found by neurologist after    Past Surgical History:  Procedure Laterality Date  . ABDOMINAL HYSTERECTOMY  1970   Partial   . COLON RESECTION  2006  cancer  . COLONOSCOPY    . EYE SURGERY Bilateral    Cataract  . MAXIMUM ACCESS (MAS)POSTERIOR LUMBAR INTERBODY FUSION (PLIF) 2 LEVEL N/A 04/12/2015   Procedure: Lumbar Three-Five Decompression, Pedicle Screw Fixation, and Posteriolateral Arthrodesis;  Surgeon: Erline Levine, MD;  Location: [place] [facility];  Service: Neurosurgery;  Laterality: N/A;  L3-4 L4-5 Maximum access posterior lumbar fusion, possible interbodies and resection of synovial cyst at L4-5  . MITRAL VALVE REPAIR  01/20/13   Gore-tex cords to P1, P2, and P3. Magic suture to posterior medial commisure, #30 Physio 1 ring. Done in [place]  . TONSILLECTOMY     about 1940  . TRICUSPID VALVE SURGERY  01/20/13   #28 TriAd ring done in [place]    Allergies  Allergen Reactions  . Codeine Other (See Comments)      "just don't take it well"  . Molds & Smuts   . Pollen Extract Swelling  . Bee Venom Swelling and Rash    Outpatient Encounter Medications as of 07/06/2019  Medication Sig  . acetaminophen (TYLENOL) 500 MG tablet Take 1,000 mg by mouth every 6 (six) hours as needed for moderate pain.  Marland Kitchen aspirin EC 81 MG tablet Take 81 mg by mouth daily.  Marland Kitchen atorvastatin (LIPITOR) 20 MG tablet TAKE 1 TABLET DAILY  . Azelastine-Fluticasone 137-50 MCG/ACT SUSP Place 1 spray into the nose daily as needed (allergies).   . Biotin 5000 MCG CAPS Take 1 capsule by mouth daily.  . cholecalciferol (VITAMIN D) 1000 units tablet Take 1,000 Units by mouth daily.  . digoxin (LANOXIN) 0.125 MG tablet TAKE 1 TABLET DAILY  . ELIQUIS 2.5 MG TABS tablet TAKE 1 TABLET TWICE A DAY  . ENBREL SURECLICK 50 MG/ML injection   . EPINEPHrine 0.3 mg/0.3 mL IJ SOAJ injection Inject 0.3 mLs (0.3 mg total) into the muscle as needed for anaphylaxis. (Patient not taking: Reported on 05/29/2019)  . ipratropium (ATROVENT) 0.03 % nasal spray ipratropium bromide 21 mcg (0.03 %) nasal spray  . levothyroxine (SYNTHROID) 75 MCG tablet TAKE 1 TABLET DAILY BEFORE BREAKFAST FOR HYPOTHYROIDISM  . magnesium oxide (MAG-OX) 400 MG tablet Take 400 mg by mouth daily.  . Melatonin 5 MG TABS Take 1 tablet by mouth at bedtime as needed.  . metoprolol succinate (TOPROL XL) 50 MG 24 hr tablet Take 1 tablet (50 mg total) by mouth daily. Take with or immediately following a meal.  . mirtazapine (REMERON) 7.5 MG tablet Take 1 tablet (7.5 mg total) by mouth at bedtime.  . Multiple Vitamins-Minerals (PRESERVISION AREDS 2) CAPS Take 1 Dose by mouth 2 (two) times daily. For eye health  . omeprazole (PRILOSEC) 40 MG capsule Take 1 capsule (40 mg total) by mouth daily.  . polycarbophil (FIBERCON) 625 MG tablet Take 625 mg by mouth daily.  . Probiotic Product (ALIGN) 4 MG CAPS Take 1 capsule by mouth daily.  . traMADol (ULTRAM) 50 MG tablet Take 1 tablet (50 mg total) by  mouth every 6 (six) hours as needed.  . valsartan (DIOVAN) 80 MG tablet Take 80 mg by mouth daily.  . vitamin B-12 (CYANOCOBALAMIN) 1000 MCG tablet Take 1,000 mcg by mouth daily.    No facility-administered encounter medications on file as of 07/06/2019.    Review of Systems  Constitutional: Positive for activity change. Negative for appetite change, chills, diaphoresis, fatigue, fever and unexpected weight change.  HENT: Negative for congestion.   Respiratory: Negative for cough, shortness of breath and wheezing.   Cardiovascular: Negative for  chest pain, palpitations and leg swelling.  Gastrointestinal: Negative for abdominal distention, abdominal pain, constipation and diarrhea.  Genitourinary: Negative for difficulty urinating and dysuria.  Musculoskeletal: Positive for arthralgias and gait problem. Negative for back pain, joint swelling and myalgias.  Neurological: Negative for dizziness, tremors, seizures, syncope, facial asymmetry, speech difficulty, weakness, light-headedness, numbness and headaches.  Psychiatric/Behavioral: Negative for agitation, behavioral problems and confusion.    Immunization History  Administered Date(s) Administered  . Fluad Quad(high Dose 65+) 01/05/2019  . H1N1 04/07/2008  . Influenza Whole 12/29/2008, 12/22/2009  . Influenza, High Dose Seasonal PF 01/15/2012, 12/23/2014  . Influenza,inj,Quad PF,6+ Mos 01/22/2014, 12/10/2016, 01/17/2018  . Influenza,trivalent, recombinat, inj, PF 01/05/2016  . Influenza-Unspecified 04/07/2008, 12/15/2014  . PPD Test 07/17/2010, 04/19/2015, 04/25/2015  . Pneumococcal Conjugate-13 11/10/2013  . Pneumococcal Polysaccharide-23 12/27/2010, 12/05/2016  . Tdap 04/15/2017  . Zoster Recombinat (Shingrix) 04/09/2017, 06/18/2017   Pertinent  Health Maintenance Due  Topic Date Due  . INFLUENZA VACCINE  10/25/2019  . DEXA SCAN  Completed  . PNA vac Low Risk Adult  Completed   Fall Risk  07/06/2019 05/29/2019 03/09/2019  02/03/2019 02/02/2019  Falls in the past year? 0 0 0 0 0  Number falls in past yr: 0 0 0 0 0  Injury with Fall? 0 0 - 0 0  Risk for fall due to : Impaired mobility - - - -  Risk for fall due to: Comment - - - - -  Follow up Falls evaluation completed - - - -  Comment - - - - -   Functional Status Survey:    Vitals:   07/06/19 1502  BP: 139/69  Pulse: 69  Resp: 18  Temp: 97.7 F (36.5 C)  SpO2: 95%   There is no height or weight on file to calculate BMI. Physical Exam Vitals and nursing note reviewed.  Constitutional:      General: She is not in acute distress.    Appearance: She is not diaphoretic.  HENT:     Head: Normocephalic and atraumatic.     Mouth/Throat:     Mouth: Mucous membranes are moist.     Pharynx: Oropharynx is clear. No oropharyngeal exudate.  Neck:     Vascular: No JVD.  Cardiovascular:     Rate and Rhythm: Normal rate. Rhythm irregular.     Heart sounds: Murmur present.  Pulmonary:     Effort: Pulmonary effort is normal. No respiratory distress.     Breath sounds: Normal breath sounds. No wheezing.  Abdominal:     General: Bowel sounds are normal. There is no distension.     Palpations: Abdomen is soft.  Musculoskeletal:     Cervical back: No rigidity or tenderness.     Right knee: No swelling, deformity, effusion, erythema or ecchymosis. Normal range of motion. No LCL laxity, MCL laxity, ACL laxity or PCL laxity. Normal pulse.     Left knee: Effusion and erythema present. No swelling, deformity, ecchymosis, lacerations or crepitus. Normal range of motion. No tenderness. No LCL laxity, MCL laxity, ACL laxity or PCL laxity.Normal pulse.     Right lower leg: No edema.     Left lower leg: No edema.     Comments: Left knee with warmth noted when compared to the right. Pain was noted on anterior drawer exam.   Lymphadenopathy:     Cervical: No cervical adenopathy.  Skin:    General: Skin is warm and dry.  Neurological:     Mental Status: She is  alert and oriented to person, place, and time.  Psychiatric:        Mood and Affect: Mood normal.     Labs reviewed: Recent Labs    07/16/18 0000 02/03/19 0000 04/20/19 1156  NA 134* 138 137  K 4.8 5.1 4.5  CL  --  100 100  CO2  --  23* 26  GLUCOSE  --   --  110*  BUN 17 20 15   CREATININE 0.7 0.8 0.93  CALCIUM  --  9.3 9.0   Recent Labs    07/16/18 0000 02/03/19 0000 04/20/19 1156  AST 18 23 27   ALT 17 19 23   ALKPHOS 89 86 71  BILITOT  --   --  1.0  PROT  --   --  6.9  ALBUMIN  --  4.6 4.3   Recent Labs    07/16/18 0000 02/03/19 0000 04/20/19 1156  WBC 9.8 5.6 5.9  NEUTROABS 5  --  2.9  HGB 11.7* 11.7* 12.3  HCT 33* 35* 36.8  MCV  --   --  104.8*  PLT 163  --  163   Lab Results  Component Value Date   TSH 2.309 04/20/2019   Lab Results  Component Value Date   HGBA1C 5.5 01/12/2013   Lab Results  Component Value Date   CHOL 134 02/03/2019   HDL 54 02/03/2019   LDLCALC 58 02/03/2019   TRIG 110 02/03/2019    Significant Diagnostic Results in last 30 days:  CT ANGIO HEAD W OR WO CONTRAST  Result Date: 06/19/2019 CLINICAL DATA:  Aphasia.  Rule out stroke.  History of colon cancer. Creatinine was obtained on site at [place] at [address] Results: Creatinine 0.8 mg/dL. EXAM: CT ANGIOGRAPHY HEAD AND NECK TECHNIQUE: Multidetector CT imaging of the head and neck was performed using the standard protocol during bolus administration of intravenous contrast. Multiplanar CT image reconstructions and MIPs were obtained to evaluate the vascular anatomy. Carotid stenosis measurements (when applicable) are obtained utilizing NASCET criteria, using the distal internal carotid diameter as the denominator. CONTRAST:  30mL ISOVUE-370 IOPAMIDOL (ISOVUE-370) INJECTION 76% COMPARISON:  MRI head 04/20/2019 FINDINGS: CT HEAD FINDINGS Brain: Mild atrophy unchanged. Mild chronic microvascular ischemic changes in the white matter. No acute infarct, hemorrhage,  mass. Vascular: Mild atherosclerotic calcification. Negative for hyperdense vessel Skull: No focal skeletal lesion Sinuses: Paranasal sinuses clear. Orbits: Bilateral cataract surgery.  No orbital mass. Review of the MIP images confirms the above findings CTA NECK FINDINGS Aortic arch: Standard branching. Imaged portion shows no evidence of aneurysm or dissection. No significant stenosis of the major arch vessel origins. Right carotid system: Mild atherosclerotic calcification of the carotid bifurcation without significant stenosis. Left carotid system: Atherosclerotic calcification left carotid bifurcation without significant stenosis. Vertebral arteries: Both vertebral arteries widely patent without significant stenosis. Skeleton: Advanced cervical spondylosis. Spondylolisthesis C2-3, C3-4, C4-5, C5-6. Prominent kyphosis in the cervical spine. Multilevel spinal and foraminal stenosis due to spurring. Other neck: Negative for mass or adenopathy. Upper chest: Pleuroparenchymal scarring in the lung apices bilaterally. Review of the MIP images confirms the above findings CTA HEAD FINDINGS Anterior circulation: Atherosclerotic calcification in the cavernous carotid bilaterally without significant stenosis. Anterior and middle cerebral arteries patent bilaterally without stenosis. Posterior circulation: Both vertebral arteries patent to the basilar without stenosis. Mild atherosclerotic disease distal left vertebral artery. PICA patent bilaterally. Basilar widely patent. Superior cerebellar and posterior cerebral arteries patent bilaterally without stenosis. Venous sinuses: Normal venous enhancement. Anatomic variants:  None Review of the MIP images confirms the above findings IMPRESSION: 1. Atrophy and chronic microvascular ischemic change. No acute intracranial abnormality 2. No significant carotid or vertebral artery stenosis in the neck. No significant intracranial stenosis. Negative for large vessel occlusion 3.  Advanced cervical spondylosis with multilevel spinal and foraminal stenosis due to spurring. Electronically Signed   By: Franchot Gallo M.D.   On: 06/19/2019 11:57   CT ANGIO NECK W OR WO CONTRAST  Result Date: 06/19/2019 CLINICAL DATA:  Aphasia.  Rule out stroke.  History of colon cancer. Creatinine was obtained on site at [place] at [address] Results: Creatinine 0.8 mg/dL. EXAM: CT ANGIOGRAPHY HEAD AND NECK TECHNIQUE: Multidetector CT imaging of the head and neck was performed using the standard protocol during bolus administration of intravenous contrast. Multiplanar CT image reconstructions and MIPs were obtained to evaluate the vascular anatomy. Carotid stenosis measurements (when applicable) are obtained utilizing NASCET criteria, using the distal internal carotid diameter as the denominator. CONTRAST:  56mL ISOVUE-370 IOPAMIDOL (ISOVUE-370) INJECTION 76% COMPARISON:  MRI head 04/20/2019 FINDINGS: CT HEAD FINDINGS Brain: Mild atrophy unchanged. Mild chronic microvascular ischemic changes in the white matter. No acute infarct, hemorrhage, mass. Vascular: Mild atherosclerotic calcification. Negative for hyperdense vessel Skull: No focal skeletal lesion Sinuses: Paranasal sinuses clear. Orbits: Bilateral cataract surgery.  No orbital mass. Review of the MIP images confirms the above findings CTA NECK FINDINGS Aortic arch: Standard branching. Imaged portion shows no evidence of aneurysm or dissection. No significant stenosis of the major arch vessel origins. Right carotid system: Mild atherosclerotic calcification of the carotid bifurcation without significant stenosis. Left carotid system: Atherosclerotic calcification left carotid bifurcation without significant stenosis. Vertebral arteries: Both vertebral arteries widely patent without significant stenosis. Skeleton: Advanced cervical spondylosis. Spondylolisthesis C2-3, C3-4, C4-5, C5-6. Prominent kyphosis in the cervical spine.  Multilevel spinal and foraminal stenosis due to spurring. Other neck: Negative for mass or adenopathy. Upper chest: Pleuroparenchymal scarring in the lung apices bilaterally. Review of the MIP images confirms the above findings CTA HEAD FINDINGS Anterior circulation: Atherosclerotic calcification in the cavernous carotid bilaterally without significant stenosis. Anterior and middle cerebral arteries patent bilaterally without stenosis. Posterior circulation: Both vertebral arteries patent to the basilar without stenosis. Mild atherosclerotic disease distal left vertebral artery. PICA patent bilaterally. Basilar widely patent. Superior cerebellar and posterior cerebral arteries patent bilaterally without stenosis. Venous sinuses: Normal venous enhancement. Anatomic variants: None Review of the MIP images confirms the above findings IMPRESSION: 1. Atrophy and chronic microvascular ischemic change. No acute intracranial abnormality 2. No significant carotid or vertebral artery stenosis in the neck. No significant intracranial stenosis. Negative for large vessel occlusion 3. Advanced cervical spondylosis with multilevel spinal and foraminal stenosis due to spurring. Electronically Signed   By: Franchot Gallo M.D.   On: 06/19/2019 11:57   DG Knee Complete 4 Views Left  Result Date: 07/05/2019 CLINICAL DATA:  86 year old female with trauma to the left knee. EXAM: LEFT KNEE - COMPLETE 4+ VIEW COMPARISON:  Left knee radiograph dated 10/13/2018. FINDINGS: There is no acute fracture or dislocation. There is mild osteopenia. No significant arthritic changes. There is a small suprapatellar effusion. The soft tissues are unremarkable. Vascular calcifications noted. IMPRESSION: 1. No acute fracture or dislocation. 2. Small suprapatellar effusion. Electronically Signed   By: Anner Crete M.D.   On: 07/05/2019 22:50   DG Knee Complete 4 Views Right  Result Date: 07/05/2019 CLINICAL DATA:  Knee pain EXAM: RIGHT KNEE -  COMPLETE 4+ VIEW  COMPARISON:  None. FINDINGS: Moderate degenerative changes in the lateral and patellofemoral compartments. No fracture or dislocation is seen. Small suprapatellar knee joint effusion. IMPRESSION: Moderate degenerative changes with small suprapatellar knee joint effusion. Electronically Signed   By: Julian Hy M.D.   On: 07/05/2019 22:49   LE VENOUS  Result Date: 07/06/2019  Lower Venous DVTStudy Indications: Pain.  Limitations: Clothing restriction, patient on EMT stretcher. Comparison Study: No prior study Performing Technologist: Maudry Mayhew MHA, RDMS, RVT, RDCS  Examination Guidelines: A complete evaluation includes B-mode imaging, spectral Doppler, color Doppler, and power Doppler as needed of all accessible portions of each vessel. Bilateral testing is considered an integral part of a complete examination. Limited examinations for reoccurring indications may be performed as noted. The reflux portion of the exam is performed with the patient in reverse Trendelenburg.  +---------+---------------+---------+-----------+----------+--------------+ LEFT     CompressibilityPhasicitySpontaneityPropertiesThrombus Aging +---------+---------------+---------+-----------+----------+--------------+ CFV      Full           Yes      Yes                                 +---------+---------------+---------+-----------+----------+--------------+ SFJ      Full                                                        +---------+---------------+---------+-----------+----------+--------------+ FV Prox  Full                                                        +---------+---------------+---------+-----------+----------+--------------+ FV Mid   Full                                                        +---------+---------------+---------+-----------+----------+--------------+ FV DistalFull                                                         +---------+---------------+---------+-----------+----------+--------------+ PFV      Full                                                        +---------+---------------+---------+-----------+----------+--------------+ POP      Full           Yes      Yes                                 +---------+---------------+---------+-----------+----------+--------------+ PTV      Full                                                        +---------+---------------+---------+-----------+----------+--------------+  PERO     Full                                                        +---------+---------------+---------+-----------+----------+--------------+     Summary: LEFT: - There is no evidence of deep vein thrombosis in the lower extremity.  - No cystic structure found in the popliteal fossa.  *See table(s) above for measurements and observations.    Preliminary     Assessment/Plan 1. Acute pain of left knee Ligament injury vs repetitive damage from stair climbing on trip, also has RA which may contribute ICE 15 min TID x 72 hrs. Discontinue previous tylenol order and begin Tylenol 1 gram bid scheduled. Continue as needed tylenol.  Begin PT  Consider steroid injection vs oral  2. Chronic pain of right knee Due to prior hx of OA which makes it difficult for her to shift her weight more into the right side.   3. Rheumatoid arthritis involving hand with negative rheumatoid factor, unspecified laterality (HCC) Continue on Enbrel once weekly  4. Mild cognitive impairment with memory loss Noted, with prio CVA hx.  Staff to obtain MMSE   5. Chronic atrial fibrillation (HCC) Rate is controlled with Digoxin Continue Eliquis for CVA risk reduction   6. HTN (hypertension), benign Controlled.  Continue Diovan 80 mg qd    Family/ staff Communication: discussed with Ms. Beulah Gandy  Labs/tests ordered: NA

## 2019-07-07 DIAGNOSIS — M6289 Other specified disorders of muscle: Secondary | ICD-10-CM | POA: Diagnosis not present

## 2019-07-07 DIAGNOSIS — M25462 Effusion, left knee: Secondary | ICD-10-CM | POA: Diagnosis not present

## 2019-07-07 DIAGNOSIS — M170 Bilateral primary osteoarthritis of knee: Secondary | ICD-10-CM | POA: Diagnosis not present

## 2019-07-07 DIAGNOSIS — R278 Other lack of coordination: Secondary | ICD-10-CM | POA: Diagnosis not present

## 2019-07-07 DIAGNOSIS — M62562 Muscle wasting and atrophy, not elsewhere classified, left lower leg: Secondary | ICD-10-CM | POA: Diagnosis not present

## 2019-07-07 DIAGNOSIS — M25562 Pain in left knee: Secondary | ICD-10-CM | POA: Diagnosis not present

## 2019-07-07 DIAGNOSIS — R2689 Other abnormalities of gait and mobility: Secondary | ICD-10-CM | POA: Diagnosis not present

## 2019-07-07 DIAGNOSIS — S838X2S Sprain of other specified parts of left knee, sequela: Secondary | ICD-10-CM | POA: Diagnosis not present

## 2019-07-08 ENCOUNTER — Non-Acute Institutional Stay (SKILLED_NURSING_FACILITY): Payer: Medicare Other | Admitting: Internal Medicine

## 2019-07-08 ENCOUNTER — Encounter: Payer: Self-pay | Admitting: Internal Medicine

## 2019-07-08 ENCOUNTER — Encounter: Payer: Medicare Other | Admitting: Internal Medicine

## 2019-07-08 DIAGNOSIS — M6289 Other specified disorders of muscle: Secondary | ICD-10-CM | POA: Diagnosis not present

## 2019-07-08 DIAGNOSIS — M06049 Rheumatoid arthritis without rheumatoid factor, unspecified hand: Secondary | ICD-10-CM

## 2019-07-08 DIAGNOSIS — I10 Essential (primary) hypertension: Secondary | ICD-10-CM

## 2019-07-08 DIAGNOSIS — G3184 Mild cognitive impairment, so stated: Secondary | ICD-10-CM | POA: Diagnosis not present

## 2019-07-08 DIAGNOSIS — N3281 Overactive bladder: Secondary | ICD-10-CM | POA: Diagnosis not present

## 2019-07-08 DIAGNOSIS — M21961 Unspecified acquired deformity of right lower leg: Secondary | ICD-10-CM | POA: Diagnosis not present

## 2019-07-08 DIAGNOSIS — M25562 Pain in left knee: Secondary | ICD-10-CM

## 2019-07-08 DIAGNOSIS — M25462 Effusion, left knee: Secondary | ICD-10-CM | POA: Diagnosis not present

## 2019-07-08 DIAGNOSIS — R278 Other lack of coordination: Secondary | ICD-10-CM | POA: Diagnosis not present

## 2019-07-08 DIAGNOSIS — I482 Chronic atrial fibrillation, unspecified: Secondary | ICD-10-CM

## 2019-07-08 DIAGNOSIS — M25561 Pain in right knee: Secondary | ICD-10-CM

## 2019-07-08 DIAGNOSIS — G8929 Other chronic pain: Secondary | ICD-10-CM

## 2019-07-08 DIAGNOSIS — F5104 Psychophysiologic insomnia: Secondary | ICD-10-CM

## 2019-07-08 DIAGNOSIS — R2689 Other abnormalities of gait and mobility: Secondary | ICD-10-CM | POA: Diagnosis not present

## 2019-07-08 DIAGNOSIS — F5101 Primary insomnia: Secondary | ICD-10-CM

## 2019-07-08 DIAGNOSIS — M170 Bilateral primary osteoarthritis of knee: Secondary | ICD-10-CM | POA: Diagnosis not present

## 2019-07-08 DIAGNOSIS — K5909 Other constipation: Secondary | ICD-10-CM | POA: Diagnosis not present

## 2019-07-08 NOTE — Progress Notes (Signed)
Provider:  Rexene Edison. Mariea Clonts, D.O., C.M.D. Location:  [facility] Room Number: 102Place of Service:  SNF (31)SNF (rehab)  PCP: Gayland Curry, DO Patient Care Team: Gayland Curry, DO as PCP - General (Geriatric Medicine) Pixie Casino, MD as PCP - Cardiology (Cardiology) Pieter Partridge, DO as Consulting Physician (Neurology)  Extended Emergency Contact Information Primary Emergency Contact: [name],Kimberly Address: [place], [place] [address] [address] [place] Phone: [phone] Relation: Daughter  Code status:  DNR Goals of Care: Advanced Directive information Advanced Directives 07/08/2019  Does Patient Have a Medical Advance Directive? Yes  Type of Advance Directive [place]  Does patient want to make changes to medical advance directive? No - Patient declined  Copy of [place] in Chart? Yes - validated most recent copy scanned in chart (See row information)  Would patient like information on creating a medical advance directive? -      Chief Complaint  Patient presents with  . New Admit To SNF    New admission  to SNF    HPI: Patient is a 86 y.o. female seen today for admission to [facility].  She was seen initially for an acute visit to sort out her medications and get therapy orders.  NP note indicates:   She presented in the ER on 07/05/19 for severe left knee pain. She reports that she went to [place] and stayed in a place that had a lot of stairs and during her trip her knee began to hurt. The pain in the left knee became worse after a 5 hr car ride (posterior pain).  She denied any twisting motion or hearing a pop (a twisting motion was previously reported in the ER).  She came to [facility] due to increased care needs related to her knee pain and inability to bear weight. In the ER a left knee xray was ordered which showed no acute fracture  but a small effusion was noted. She reports her right knee has some chronic pain in it and she has noticed that it has been bothering her as well. Last year, she received an injection in the right knee which helped. She reports her pain in the left knee is a 9 upon trying to bear weight but minimal at rest. She took one dose of tramadol last night which helped.  07/06/2019 she went for a venous doppler to the left leg due to the posterior nature of her pain which showed no acute clot or cyst.    She is fully vaccinated for covid.   Turns out, during her trip to [place], she did more walking than usual and also at a rest stop on the way home.  She had more pain after this.  She has begun working with PT, OT and making good progress using a rolling walker with skis.  I spoke with Thamas Jaegers and his long-term plan would be for her to use a rollator walker for support.    We discussed how she is still having challenges with poor sleep.  Pt says it's once in a while, but Hosie Spangle says it happens most of the time that she gets only a few hours of rest.  Discussed that most sedative/hypnotic meds have bad side effects in older adults and some changes in sleep patterns are normal with age.  We opted to up her mirtazapine dose from  7.'5mg'$  to '15mg'$  qhs.    She also continues to struggle with urinary frequency and urgency of small volumes of urine at a time--this interferes with her sleep also.  Discussed that with her cognitive impairment and fall risk, Myrbetriq '25mg'$  was the only option I deem safe for her to try.    MMSE has not yet been done in SNF.  Asked that this get completed asap.   MMSE - Mini Mental State Exam 01/28/2018  Orientation to time 5  Orientation to Place 5  Registration 3  Attention/ Calculation 5  Recall 3  Language- name 2 objects 2  Language- repeat 1  Language- follow 3 step command 3  Language- read & follow direction 1  Write a sentence 1  Copy design 1  Total score 30  She has short-term  memory loss and discussion ensued about her finances which she appeared to be overwhelmed handling.  Her daughter encouraged her to continue at this time to keep her mind going.    Hosie Spangle also wants her to exercise and stay active--feels we have not been proactive enough with this here.    We discussed her constipation which remains a concern.  She is agreeable to trying to use some fiber cereal on foods and we discussed hydration in detail.  Past Medical History:  Diagnosis Date  . Anemia   . Anxiety   . Arthritis    Back  . Atrial fibrillation ([place])   . Cancer [facility]) 2006   Colon.  Basal Cell Skin cancer- right arm  . Colon cancer ([place])   . Constipation due to pain medication therapy    after heart surgery  . Dysrhythmia    PAF  . GERD (gastroesophageal reflux disease)   . Heart murmur   . Hypertension   . Hypothyroidism   . RA (rheumatoid arthritis) ([place])   . Restless leg   . Seizures ([place])    after Heart Surgey  . Stroke [facility])    TIA- found by neurologist after    Past Surgical History:  Procedure Laterality Date  . ABDOMINAL HYSTERECTOMY  1970   Partial   . COLON RESECTION  2006   cancer  . COLONOSCOPY    . EYE SURGERY Bilateral    Cataract  . MAXIMUM ACCESS (MAS)POSTERIOR LUMBAR INTERBODY FUSION (PLIF) 2 LEVEL N/A 04/12/2015   Procedure: Lumbar Three-Five Decompression, Pedicle Screw Fixation, and Posteriolateral Arthrodesis;  Surgeon: Erline Levine, MD;  Location: [facility];  Service: Neurosurgery;  Laterality: N/A;  L3-4 L4-5 Maximum access posterior lumbar fusion, possible interbodies and resection of synovial cyst at L4-5  . MITRAL VALVE REPAIR  01/20/13   Gore-tex cords to P1, P2, and P3. Magic suture to posterior medial commisure, #30 Physio 1 ring. Done in [place]  . TONSILLECTOMY     about 1940  . TRICUSPID VALVE SURGERY  01/20/13   #28 TriAd ring done in [place]    Social History   Socioeconomic History  . Marital status: Widowed    Spouse name:  Not on file  . Number of children: Not on file  . Years of education: Not on file  . Highest education level: Not on file  Occupational History  . Occupation: Automotive engineer    Comment: retired   Tobacco Use  . Smoking status: Never Smoker  . Smokeless tobacco: Never Used  Substance and Sexual Activity  . Alcohol use: Yes    Alcohol/week: 1.0 standard drinks    Types: 1  Glasses of wine per week  . Drug use: No  . Sexual activity: Not Currently  Other Topics Concern  . Not on file  Social History Narrative   Social History      Diet? Healthy- low salt, sugar, fat      Do you drink/eat things with caffeine? On occasion      Marital status?        widow                        What year were you married? 1958      Do you live in a house, apartment, assisted living, condo, trailer, etc.? apartment      Is it one or more stories? one      How many persons live in your home? one      Do you have any pets in your home? (please list) no      Highest level of education completed? 4 year college      Current or past profession: Statistician      Do you exercise?             yes                         Type & how often? Classes- senior retirement community/ some walking      Advanced Directives      Do you have a living will?      Do you have a DNR form?                                  If not, do you want to discuss one?      Do you have signed POA/HPOA for forms?       Functional Status Completed by: Daughter, Di Kindle      Do you have difficulty bathing or dressing yourself? no      Do you have difficulty preparing food or eating? no      Do you have difficulty managing your medications? no      Do you have difficulty managing your finances? no      Do you have difficulty affording your medications? no   Social Determinants of Health   Financial Resource Strain:   . Difficulty of Paying Living Expenses:   Food Insecurity:   . Worried  About Charity fundraiser in the Last Year:   . Arboriculturist in the Last Year:   Transportation Needs:   . Film/video editor (Medical):   Marland Kitchen Lack of Transportation (Non-Medical):   Physical Activity:   . Days of Exercise per Week:   . Minutes of Exercise per Session:   Stress:   . Feeling of Stress :   Social Connections:   . Frequency of Communication with Friends and Family:   . Frequency of Social Gatherings with Friends and Family:   . Attends Religious Services:   . Active Member of Clubs or Organizations:   . Attends Archivist Meetings:   Marland Kitchen Marital Status:     reports that she has never smoked. She has never used smokeless tobacco. She reports current alcohol use of about 1.0 standard drinks of alcohol per week. She reports that she does not use drugs.  Functional Status Survey:    Family History  Problem Relation  Age of Onset  . Lung disease Mother 14  . Alcohol abuse Son   . Heart disease Father 98    Health Maintenance  Topic Date Due  . COVID-19 Vaccine (1) Never done  . INFLUENZA VACCINE  10/25/2019  . TETANUS/TDAP  04/16/2027  . DEXA SCAN  Completed  . PNA vac Low Risk Adult  Completed    Allergies  Allergen Reactions  . Codeine Other (See Comments)    "just don't take it well"  . Molds & Smuts   . Pollen Extract Swelling  . Bee Venom Swelling and Rash    Outpatient Encounter Medications as of 07/08/2019  Medication Sig  . acetaminophen (TYLENOL) 500 MG tablet Take 1,000 mg by mouth every 6 (six) hours as needed for moderate pain.  Marland Kitchen aspirin EC 81 MG tablet Take 81 mg by mouth daily.  Marland Kitchen atorvastatin (LIPITOR) 20 MG tablet TAKE 1 TABLET DAILY  . Azelastine-Fluticasone 137-50 MCG/ACT SUSP Place 1 spray into the nose daily as needed (allergies).   . Biotin 5000 MCG CAPS Take 1 capsule by mouth daily.  . cholecalciferol (VITAMIN D) 1000 units tablet Take 1,000 Units by mouth daily.  . digoxin (LANOXIN) 0.125 MG tablet TAKE 1 TABLET  DAILY  . ELIQUIS 2.5 MG TABS tablet TAKE 1 TABLET TWICE A DAY  . ENBREL SURECLICK 50 MG/ML injection   . EPINEPHrine 0.3 mg/0.3 mL IJ SOAJ injection Inject 0.3 mLs (0.3 mg total) into the muscle as needed for anaphylaxis.  Marland Kitchen ipratropium (ATROVENT) 0.03 % nasal spray ipratropium bromide 21 mcg (0.03 %) nasal spray  . levothyroxine (SYNTHROID) 75 MCG tablet TAKE 1 TABLET DAILY BEFORE BREAKFAST FOR HYPOTHYROIDISM  . magnesium oxide (MAG-OX) 400 MG tablet Take 400 mg by mouth daily.  . Melatonin 5 MG TABS Take 1 tablet by mouth at bedtime as needed.  . metoprolol succinate (TOPROL XL) 50 MG 24 hr tablet Take 1 tablet (50 mg total) by mouth daily. Take with or immediately following a meal.  . mirtazapine (REMERON) 7.5 MG tablet Take 1 tablet (7.5 mg total) by mouth at bedtime.  . Multiple Vitamins-Minerals (PRESERVISION AREDS 2) CAPS Take 1 Dose by mouth 2 (two) times daily. For eye health  . omeprazole (PRILOSEC) 40 MG capsule Take 1 capsule (40 mg total) by mouth daily.  . polycarbophil (FIBERCON) 625 MG tablet Take 625 mg by mouth daily.  . Probiotic Product (ALIGN) 4 MG CAPS Take 1 capsule by mouth daily.  . traMADol (ULTRAM) 50 MG tablet Take 1 tablet (50 mg total) by mouth every 6 (six) hours as needed.  . valsartan (DIOVAN) 80 MG tablet Take 80 mg by mouth daily.  . vitamin B-12 (CYANOCOBALAMIN) 1000 MCG tablet Take 1,000 mcg by mouth daily.    No facility-administered encounter medications on file as of 07/08/2019.    Review of Systems  Constitutional: Negative for chills, fever and malaise/fatigue.  HENT: Positive for hearing loss.   Eyes: Negative for blurred vision.  Respiratory: Negative for cough and shortness of breath.   Cardiovascular: Negative for chest pain, palpitations and leg swelling.  Gastrointestinal: Positive for constipation. Negative for abdominal pain, blood in stool, diarrhea, heartburn, melena, nausea and vomiting.  Genitourinary: Positive for frequency and  urgency. Negative for dysuria and hematuria.  Musculoskeletal: Positive for joint pain. Negative for back pain, falls, myalgias and neck pain.  Skin: Negative for itching and rash.  Neurological: Negative for dizziness, focal weakness and loss of consciousness.  Endo/Heme/Allergies: Bruises/bleeds easily.  Psychiatric/Behavioral: Positive for depression and memory loss. Negative for hallucinations. The patient has insomnia. The patient is not nervous/anxious.        Spirits seem good these days    Vitals:   07/08/19 1400  BP: 131/84  Pulse: 75  Temp: 98.2 F (36.8 C)  TempSrc: Temporal  SpO2: 97%  Weight: 120 lb (54.4 kg)  Height: '5\' 2"'$  (1.575 m)   Body mass index is 21.95 kg/m. Physical Exam Vitals reviewed.  Constitutional:      General: She is not in acute distress.    Appearance: Normal appearance. She is not ill-appearing or toxic-appearing.  HENT:     Head: Normocephalic and atraumatic.     Right Ear: Tympanic membrane, ear canal and external ear normal.     Left Ear: Tympanic membrane, ear canal and external ear normal.     Nose: Rhinorrhea present.     Comments: chronic    Mouth/Throat:     Pharynx: Oropharynx is clear.  Eyes:     Extraocular Movements: Extraocular movements intact.     Conjunctiva/sclera: Conjunctivae normal.     Pupils: Pupils are equal, round, and reactive to light.  Cardiovascular:     Rate and Rhythm: Normal rate. Rhythm irregular.     Pulses: Normal pulses.     Heart sounds: Normal heart sounds.  Pulmonary:     Effort: Pulmonary effort is normal.     Breath sounds: Normal breath sounds.  Abdominal:     General: Bowel sounds are normal. There is no distension.     Palpations: Abdomen is soft. There is no mass.     Tenderness: There is no abdominal tenderness. There is no guarding or rebound.  Musculoskeletal:        General: Normal range of motion.     Cervical back: Neck supple.     Right lower leg: No edema.     Left lower leg: No  edema.     Comments: Not even having tenderness of left posterolateral knee like she did have, has asymmetry b/c posterior knees (deformity of right knee after prior ankle fx contributes)  Lymphadenopathy:     Cervical: No cervical adenopathy.  Skin:    General: Skin is warm and dry.     Capillary Refill: Capillary refill takes less than 2 seconds.  Neurological:     General: No focal deficit present.     Mental Status: She is alert and oriented to person, place, and time. Mental status is at baseline.     Cranial Nerves: No cranial nerve deficit.     Sensory: No sensory deficit.     Motor: No weakness.     Coordination: Coordination normal.     Gait: Gait abnormal.     Deep Tendon Reflexes: Reflexes normal.  Psychiatric:        Mood and Affect: Mood normal.        Behavior: Behavior normal.        Thought Content: Thought content normal.     Labs reviewed: Basic Metabolic Panel: Recent Labs    07/16/18 0000 02/03/19 0000 04/20/19 1156  NA 134* 138 137  K 4.8 5.1 4.5  CL  --  100 100  CO2  --  23* 26  GLUCOSE  --   --  110*  BUN '17 20 15  '$ CREATININE 0.7 0.8 0.93  CALCIUM  --  9.3 9.0   Liver Function Tests: Recent Labs    07/16/18 0000 02/03/19 0000 04/20/19  1156  AST '18 23 27  '$ ALT '17 19 23  '$ ALKPHOS 89 86 71  BILITOT  --   --  1.0  PROT  --   --  6.9  ALBUMIN  --  4.6 4.3   No results for input(s): LIPASE, AMYLASE in the last 8760 hours. No results for input(s): AMMONIA in the last 8760 hours. CBC: Recent Labs    07/16/18 0000 02/03/19 0000 04/20/19 1156  WBC 9.8 5.6 5.9  NEUTROABS 5  --  2.9  HGB 11.7* 11.7* 12.3  HCT 33* 35* 36.8  MCV  --   --  104.8*  PLT 163  --  163   Cardiac Enzymes: No results for input(s): CKTOTAL, CKMB, CKMBINDEX, TROPONINI in the last 8760 hours. BNP: Invalid input(s): POCBNP Lab Results  Component Value Date   HGBA1C 5.5 01/12/2013   Lab Results  Component Value Date   TSH 2.309 04/20/2019   Lab Results    Component Value Date   IOMBTDHR41 6,384 02/03/2019   No results found for: FOLATE No results found for: IRON, TIBC, FERRITIN  Imaging and Procedures obtained prior to SNF admission: LE VENOUS  Result Date: 07/07/2019  Lower Venous DVTStudy Indications: Pain.  Limitations: Clothing restriction, patient on EMT stretcher. Comparison Study: No prior study Performing Technologist: Maudry Mayhew MHA, RDMS, RVT, RDCS  Examination Guidelines: A complete evaluation includes B-mode imaging, spectral Doppler, color Doppler, and power Doppler as needed of all accessible portions of each vessel. Bilateral testing is considered an integral part of a complete examination. Limited examinations for reoccurring indications may be performed as noted. The reflux portion of the exam is performed with the patient in reverse Trendelenburg.  +---------+---------------+---------+-----------+----------+--------------+ LEFT     CompressibilityPhasicitySpontaneityPropertiesThrombus Aging +---------+---------------+---------+-----------+----------+--------------+ CFV      Full           Yes      Yes                                 +---------+---------------+---------+-----------+----------+--------------+ SFJ      Full                                                        +---------+---------------+---------+-----------+----------+--------------+ FV Prox  Full                                                        +---------+---------------+---------+-----------+----------+--------------+ FV Mid   Full                                                        +---------+---------------+---------+-----------+----------+--------------+ FV DistalFull                                                        +---------+---------------+---------+-----------+----------+--------------+ PFV  Full                                                         +---------+---------------+---------+-----------+----------+--------------+ POP      Full           Yes      Yes                                 +---------+---------------+---------+-----------+----------+--------------+ PTV      Full                                                        +---------+---------------+---------+-----------+----------+--------------+ PERO     Full                                                        +---------+---------------+---------+-----------+----------+--------------+     Summary: LEFT: - There is no evidence of deep vein thrombosis in the lower extremity.  - No cystic structure found in the popliteal fossa.  *See table(s) above for measurements and observations. Electronically signed by Deitra Mayo MD on 07/07/2019 at 3:25:01 PM.    Final     Assessment/Plan 1. Acute pain of left knee -is improving with PT, walker use -cont therapy here -may need to be less aggressive with her exercise routine and more focused on her strength and balance to prevent falls -has prn tramadol when severe  2. Chronic pain of right knee -Ongoing, cont tylenol, may use tramadol if severe, related to some prior right ankle injury   3. Acquired deformity of right knee -cont therapy recommendations  4. Rheumatoid arthritis involving hand with negative rheumatoid factor, unspecified laterality (HCC) -on enbrel injections through rheumatology, Dr. Trudie Josejuan Hoaglin  5. Mild cognitive impairment with memory loss -needs new MMSE done due to progressive short-term memory loss (worsened amid covid isolation)  6. Chronic atrial fibrillation (HCC) -cont toprol xl for rate control, eliquis, dig -on baby asa also due to prior TIA  7. HTN (hypertension), benign -bp satisfactory here thus far, continue same meds and cont to monitor  8. Chronic constipation -add fiber cereal to daily routine in some way (ok to put on other foods like fruit or yogurt) -MUST hydrate  well--discussed how coffee/tea/soda don't count--only water, juices  9. Overactive bladder -try on myrbetriq -already advised that others are not safe options for her due to anticholinergic effects -trying to avoid yet another subspecialty referral b/c she already has too many doctor syndrome with an extensive medication list at risk for polypharmacy  10. Psychophysiological insomnia -increase mirtazapine to '15mg'$  nightly from 7.'5mg'$  nightly and monitor while here for sleep   Family/ staff Communication: met with pt and her daughter for an hour today  Labs/tests ordered:  No new, MMSE already requested  Temitope Griffing L. Login Muckleroy, D.O. [facility] [address], [place] [address] Cell Phone (Mon-Fri 8am-5pm):  [phone] On Call:  [phone] & follow prompts after 5pm & weekends Office Phone:  [phone] Office Fax:  [phone]

## 2019-07-09 DIAGNOSIS — R2689 Other abnormalities of gait and mobility: Secondary | ICD-10-CM | POA: Diagnosis not present

## 2019-07-09 DIAGNOSIS — M170 Bilateral primary osteoarthritis of knee: Secondary | ICD-10-CM | POA: Diagnosis not present

## 2019-07-09 DIAGNOSIS — M25462 Effusion, left knee: Secondary | ICD-10-CM | POA: Diagnosis not present

## 2019-07-09 DIAGNOSIS — M25562 Pain in left knee: Secondary | ICD-10-CM | POA: Diagnosis not present

## 2019-07-09 DIAGNOSIS — R278 Other lack of coordination: Secondary | ICD-10-CM | POA: Diagnosis not present

## 2019-07-09 DIAGNOSIS — M6289 Other specified disorders of muscle: Secondary | ICD-10-CM | POA: Diagnosis not present

## 2019-07-10 DIAGNOSIS — M6289 Other specified disorders of muscle: Secondary | ICD-10-CM | POA: Diagnosis not present

## 2019-07-10 DIAGNOSIS — M170 Bilateral primary osteoarthritis of knee: Secondary | ICD-10-CM | POA: Diagnosis not present

## 2019-07-10 DIAGNOSIS — R278 Other lack of coordination: Secondary | ICD-10-CM | POA: Diagnosis not present

## 2019-07-10 DIAGNOSIS — M25462 Effusion, left knee: Secondary | ICD-10-CM | POA: Diagnosis not present

## 2019-07-10 DIAGNOSIS — M25562 Pain in left knee: Secondary | ICD-10-CM | POA: Diagnosis not present

## 2019-07-10 DIAGNOSIS — R2689 Other abnormalities of gait and mobility: Secondary | ICD-10-CM | POA: Diagnosis not present

## 2019-07-13 ENCOUNTER — Non-Acute Institutional Stay (SKILLED_NURSING_FACILITY): Payer: Medicare Other | Admitting: Adult Health

## 2019-07-13 ENCOUNTER — Encounter: Payer: Self-pay | Admitting: Adult Health

## 2019-07-13 DIAGNOSIS — N3281 Overactive bladder: Secondary | ICD-10-CM | POA: Diagnosis not present

## 2019-07-13 DIAGNOSIS — R2689 Other abnormalities of gait and mobility: Secondary | ICD-10-CM | POA: Diagnosis not present

## 2019-07-13 DIAGNOSIS — F5104 Psychophysiologic insomnia: Secondary | ICD-10-CM

## 2019-07-13 DIAGNOSIS — R278 Other lack of coordination: Secondary | ICD-10-CM | POA: Diagnosis not present

## 2019-07-13 DIAGNOSIS — I482 Chronic atrial fibrillation, unspecified: Secondary | ICD-10-CM | POA: Diagnosis not present

## 2019-07-13 DIAGNOSIS — G3184 Mild cognitive impairment, so stated: Secondary | ICD-10-CM | POA: Diagnosis not present

## 2019-07-13 DIAGNOSIS — M25562 Pain in left knee: Secondary | ICD-10-CM | POA: Diagnosis not present

## 2019-07-13 DIAGNOSIS — M06049 Rheumatoid arthritis without rheumatoid factor, unspecified hand: Secondary | ICD-10-CM

## 2019-07-13 DIAGNOSIS — R252 Cramp and spasm: Secondary | ICD-10-CM

## 2019-07-13 DIAGNOSIS — M25462 Effusion, left knee: Secondary | ICD-10-CM | POA: Diagnosis not present

## 2019-07-13 DIAGNOSIS — M170 Bilateral primary osteoarthritis of knee: Secondary | ICD-10-CM | POA: Diagnosis not present

## 2019-07-13 DIAGNOSIS — M6289 Other specified disorders of muscle: Secondary | ICD-10-CM | POA: Diagnosis not present

## 2019-07-13 MED ORDER — MIRTAZAPINE 15 MG PO TABS: 15.0000 mg | ORAL_TABLET | Freq: Every day | ORAL | 3 refills | Status: DC

## 2019-07-13 NOTE — Progress Notes (Signed)
Location:  Occupational psychologist of Service:  SNF (31)  Provider:  Cindi Carbon, ANP [place] [phone]  PCP: Gayland Curry, DO Patient Care Team: Gayland Curry, DO as PCP - General (Geriatric Medicine) Pixie Casino, MD as PCP - Cardiology (Cardiology) Pieter Partridge, DO as Consulting Physician (Neurology)  Extended Emergency Contact Information Primary Emergency Contact: [name],Kimberly Address: [place], [place] [address] [address]nette Litter of [place] Phone: [phone] Relation: Daughter  Code Status: DNR Goals of care:  Advanced Directive information Advanced Directives 07/08/2019  Does Patient Have a Medical Advance Directive? Yes  Type of Advance Directive [place]  Does patient want to make changes to medical advance directive? No - Patient declined  Copy of [place] in Chart? Yes - validated most recent copy scanned in chart (See row information)  Would patient like information on creating a medical advance directive? -     Allergies  Allergen Reactions  . Codeine Other (See Comments)    "just don't take it well"  . Molds & Smuts   . Pollen Extract Swelling  . Bee Venom Swelling and Rash    Chief Complaint  Patient presents with  . Discharge Note    HPI:  86 y.o. female seen for discharge from [facility] skilled rehab after a stay due to acute left knee pain with decreased mobility. She was admitted after an ER visit on 4/11 due to left knee pain. Xrays were negative in the ER. The following day she had a venous doppler which was negative for a blood clot. The cause of her injury was thought to be due to overuse during a vacation. She began to work with PT and is now ambulatory with a walker and has had improvement in her pain. She reports minimal pain in the left knee and has not used prn ultram and continues on scheduled tylenol. During her stay she reported  issues with sleep, Remeron was increased to 15 mg She also reported urinary frequency. Myrbetriq was ordered but was too expensive so it was discontinued.  For my visit today she feels great and is happy to be going home. She is eating well and reports no issues with constipation. The urinary frequency is not too bother some to her. She does express that at night frequently she has leg cramps in the calf area and would like that looked into.  She does have a hx of MCI with memory loss and lives alone in [place] but has support from her daughter.    Past Medical History:  Diagnosis Date  . Anemia   . Anxiety   . Arthritis    Back  . Atrial fibrillation ([place])   . Cancer [facility]) 2006   Colon.  Basal Cell Skin cancer- right arm  . Colon cancer ([place])   . Constipation due to pain medication therapy    after heart surgery  . Dysrhythmia    PAF  . GERD (gastroesophageal reflux disease)   . Heart murmur   . Hypertension   . Hypothyroidism   . RA (rheumatoid arthritis) ([place])   . Restless leg   . Seizures ([place])    after Heart Surgey  . Stroke [facility])    TIA- found by neurologist after     Past Surgical History:  Procedure Laterality Date  . ABDOMINAL HYSTERECTOMY  1970   Partial   .  COLON RESECTION  2006   cancer  . COLONOSCOPY    . EYE SURGERY Bilateral    Cataract  . MAXIMUM ACCESS (MAS)POSTERIOR LUMBAR INTERBODY FUSION (PLIF) 2 LEVEL N/A 04/12/2015   Procedure: Lumbar Three-Five Decompression, Pedicle Screw Fixation, and Posteriolateral Arthrodesis;  Surgeon: Erline Levine, MD;  Location: [facility];  Service: Neurosurgery;  Laterality: N/A;  L3-4 L4-5 Maximum access posterior lumbar fusion, possible interbodies and resection of synovial cyst at L4-5  . MITRAL VALVE REPAIR  01/20/13   Gore-tex cords to P1, P2, and P3. Magic suture to posterior medial commisure, #30 Physio 1 ring. Done in [place]  . TONSILLECTOMY     about 1940  . TRICUSPID VALVE SURGERY  01/20/13   #28 TriAd ring done  in [place]      reports that she has never smoked. She has never used smokeless tobacco. She reports current alcohol use of about 1.0 standard drinks of alcohol per week. She reports that she does not use drugs. Social History   Socioeconomic History  . Marital status: Widowed    Spouse name: Not on file  . Number of children: Not on file  . Years of education: Not on file  . Highest education level: Not on file  Occupational History  . Occupation: Automotive engineer    Comment: retired   Tobacco Use  . Smoking status: Never Smoker  . Smokeless tobacco: Never Used  Substance and Sexual Activity  . Alcohol use: Yes    Alcohol/week: 1.0 standard drinks    Types: 1 Glasses of wine per week  . Drug use: No  . Sexual activity: Not Currently  Other Topics Concern  . Not on file  Social History Narrative   Social History      Diet? Healthy- low salt, sugar, fat      Do you drink/eat things with caffeine? On occasion      Marital status?        widow                        What year were you married? 1958      Do you live in a house, apartment, assisted living, condo, trailer, etc.? apartment      Is it one or more stories? one      How many persons live in your home? one      Do you have any pets in your home? (please list) no      Highest level of education completed? 4 year college      Current or past profession: Statistician      Do you exercise?             yes                         Type & how often? Classes- senior retirement community/ some walking      Advanced Directives      Do you have a living will?      Do you have a DNR form?                                  If not, do you want to discuss one?      Do you have signed POA/HPOA for forms?       Functional Status Completed by: Daughter, Hosie Spangle  [name]      Do you have difficulty bathing or dressing yourself? no      Do you have difficulty preparing food or eating? no      Do you have  difficulty managing your medications? no      Do you have difficulty managing your finances? no      Do you have difficulty affording your medications? no   Social Determinants of Health   Financial Resource Strain:   . Difficulty of Paying Living Expenses:   Food Insecurity:   . Worried About Charity fundraiser in the Last Year:   . Arboriculturist in the Last Year:   Transportation Needs:   . Film/video editor (Medical):   Marland Kitchen Lack of Transportation (Non-Medical):   Physical Activity:   . Days of Exercise per Week:   . Minutes of Exercise per Session:   Stress:   . Feeling of Stress :   Social Connections:   . Frequency of Communication with Friends and Family:   . Frequency of Social Gatherings with Friends and Family:   . Attends Religious Services:   . Active Member of Clubs or Organizations:   . Attends Archivist Meetings:   Marland Kitchen Marital Status:   Intimate Partner Violence:   . Fear of Current or Ex-Partner:   . Emotionally Abused:   Marland Kitchen Physically Abused:   . Sexually Abused:    Functional Status Survey:    Allergies  Allergen Reactions  . Codeine Other (See Comments)    "just don't take it well"  . Molds & Smuts   . Pollen Extract Swelling  . Bee Venom Swelling and Rash    Pertinent  Health Maintenance Due  Topic Date Due  . INFLUENZA VACCINE  10/25/2019  . DEXA SCAN  Completed  . PNA vac Low Risk Adult  Completed    Medications: Outpatient Encounter Medications as of 07/13/2019  Medication Sig  . acetaminophen (TYLENOL) 500 MG tablet Take 1,000 mg by mouth every 6 (six) hours as needed for moderate pain.  Marland Kitchen aspirin EC 81 MG tablet Take 81 mg by mouth daily.  Marland Kitchen atorvastatin (LIPITOR) 20 MG tablet TAKE 1 TABLET DAILY  . Azelastine-Fluticasone 137-50 MCG/ACT SUSP Place 1 spray into the nose daily as needed (allergies).   . Biotin 5000 MCG CAPS Take 1 capsule by mouth daily.  . cholecalciferol (VITAMIN D) 1000 units tablet Take 1,000 Units by  mouth daily.  . digoxin (LANOXIN) 0.125 MG tablet TAKE 1 TABLET DAILY  . ELIQUIS 2.5 MG TABS tablet TAKE 1 TABLET TWICE A DAY  . ENBREL SURECLICK 50 MG/ML injection   . EPINEPHrine 0.3 mg/0.3 mL IJ SOAJ injection Inject 0.3 mLs (0.3 mg total) into the muscle as needed for anaphylaxis.  Marland Kitchen ipratropium (ATROVENT) 0.03 % nasal spray ipratropium bromide 21 mcg (0.03 %) nasal spray  . levothyroxine (SYNTHROID) 75 MCG tablet TAKE 1 TABLET DAILY BEFORE BREAKFAST FOR HYPOTHYROIDISM  . magnesium oxide (MAG-OX) 400 MG tablet Take 400 mg by mouth daily.  . Melatonin 5 MG TABS Take 1 tablet by mouth at bedtime as needed.  . metoprolol succinate (TOPROL XL) 50 MG 24 hr tablet Take 1 tablet (50 mg total) by mouth daily. Take with or immediately following a meal.  . mirtazapine (REMERON) 15 MG tablet Take 1 tablet (15 mg total) by mouth at bedtime.  . Multiple Vitamins-Minerals (PRESERVISION AREDS 2) CAPS Take 1 Dose by mouth 2 (two) times daily.  For eye health  . omeprazole (PRILOSEC) 40 MG capsule Take 1 capsule (40 mg total) by mouth daily.  . polycarbophil (FIBERCON) 625 MG tablet Take 625 mg by mouth daily.  . Probiotic Product (ALIGN) 4 MG CAPS Take 1 capsule by mouth daily.  . traMADol (ULTRAM) 50 MG tablet Take 1 tablet (50 mg total) by mouth every 6 (six) hours as needed.  . valsartan (DIOVAN) 80 MG tablet Take 80 mg by mouth daily.  . vitamin B-12 (CYANOCOBALAMIN) 1000 MCG tablet Take 1,000 mcg by mouth daily.    No facility-administered encounter medications on file as of 07/13/2019.    Review of Systems  Constitutional: Negative for activity change, appetite change, chills, diaphoresis, fatigue, fever and unexpected weight change.  HENT: Negative for congestion.   Respiratory: Negative for cough, shortness of breath and wheezing.   Cardiovascular: Negative for chest pain, palpitations and leg swelling.  Gastrointestinal: Negative for abdominal distention, abdominal pain, constipation and  diarrhea.  Genitourinary: Positive for frequency. Negative for difficulty urinating and dysuria.  Musculoskeletal: Positive for arthralgias and gait problem. Negative for back pain, joint swelling and myalgias.       Leg cramps  Neurological: Negative for dizziness, tremors, seizures, syncope, facial asymmetry, speech difficulty, weakness, light-headedness, numbness and headaches.  Psychiatric/Behavioral: Positive for sleep disturbance. Negative for agitation, behavioral problems and confusion.       Memory loss    Vitals:   07/13/19 1238  BP: 113/71  Pulse: 64  Resp: 18  Temp: 97.7 F (36.5 C)  SpO2: 96%   There is no height or weight on file to calculate BMI. Physical Exam Vitals and nursing note reviewed.  Constitutional:      General: She is not in acute distress.    Appearance: She is not diaphoretic.  HENT:     Head: Normocephalic and atraumatic.  Neck:     Vascular: No JVD.  Cardiovascular:     Rate and Rhythm: Normal rate. Rhythm irregular.     Heart sounds: Murmur present.  Pulmonary:     Effort: Pulmonary effort is normal. No respiratory distress.     Breath sounds: Normal breath sounds. No wheezing.  Abdominal:     General: Bowel sounds are normal. There is no distension.     Palpations: Abdomen is soft.     Tenderness: There is no abdominal tenderness.  Musculoskeletal:        General: No swelling, tenderness, deformity or signs of injury.     Right lower leg: No edema.     Left lower leg: No edema.     Comments: Improved left knee with minimal edema and not tenderness. Normal range of motion noted. Mild warmth on the left knee still noted.   Skin:    General: Skin is warm and dry.  Neurological:     Mental Status: She is alert and oriented to person, place, and time.     Labs reviewed: Basic Metabolic Panel: Recent Labs    07/16/18 0000 02/03/19 0000 04/20/19 1156  NA 134* 138 137  K 4.8 5.1 4.5  CL  --  100 100  CO2  --  23* 26  GLUCOSE  --    --  110*  BUN 17 20 15   CREATININE 0.7 0.8 0.93  CALCIUM  --  9.3 9.0   Liver Function Tests: Recent Labs    07/16/18 0000 02/03/19 0000 04/20/19 1156  AST 18 23 27   ALT 17 19 23   ALKPHOS 89 86  71  BILITOT  --   --  1.0  PROT  --   --  6.9  ALBUMIN  --  4.6 4.3   No results for input(s): LIPASE, AMYLASE in the last 8760 hours. No results for input(s): AMMONIA in the last 8760 hours. CBC: Recent Labs    07/16/18 0000 02/03/19 0000 04/20/19 1156  WBC 9.8 5.6 5.9  NEUTROABS 5  --  2.9  HGB 11.7* 11.7* 12.3  HCT 33* 35* 36.8  MCV  --   --  104.8*  PLT 163  --  163   Cardiac Enzymes: No results for input(s): CKTOTAL, CKMB, CKMBINDEX, TROPONINI in the last 8760 hours. BNP: Invalid input(s): POCBNP CBG: No results for input(s): GLUCAP in the last 8760 hours.  Procedures and Imaging Studies During Stay: CT ANGIO HEAD W OR WO CONTRAST  Result Date: 06/19/2019 CLINICAL DATA:  Aphasia.  Rule out stroke.  History of colon cancer. Creatinine was obtained on site at [facility] at [address] Results: Creatinine 0.8 mg/dL. EXAM: CT ANGIOGRAPHY HEAD AND NECK TECHNIQUE: Multidetector CT imaging of the head and neck was performed using the standard protocol during bolus administration of intravenous contrast. Multiplanar CT image reconstructions and MIPs were obtained to evaluate the vascular anatomy. Carotid stenosis measurements (when applicable) are obtained utilizing NASCET criteria, using the distal internal carotid diameter as the denominator. CONTRAST:  64mL ISOVUE-370 IOPAMIDOL (ISOVUE-370) INJECTION 76% COMPARISON:  MRI head 04/20/2019 FINDINGS: CT HEAD FINDINGS Brain: Mild atrophy unchanged. Mild chronic microvascular ischemic changes in the white matter. No acute infarct, hemorrhage, mass. Vascular: Mild atherosclerotic calcification. Negative for hyperdense vessel Skull: No focal skeletal lesion Sinuses: Paranasal sinuses clear. Orbits: Bilateral cataract  surgery.  No orbital mass. Review of the MIP images confirms the above findings CTA NECK FINDINGS Aortic arch: Standard branching. Imaged portion shows no evidence of aneurysm or dissection. No significant stenosis of the major arch vessel origins. Right carotid system: Mild atherosclerotic calcification of the carotid bifurcation without significant stenosis. Left carotid system: Atherosclerotic calcification left carotid bifurcation without significant stenosis. Vertebral arteries: Both vertebral arteries widely patent without significant stenosis. Skeleton: Advanced cervical spondylosis. Spondylolisthesis C2-3, C3-4, C4-5, C5-6. Prominent kyphosis in the cervical spine. Multilevel spinal and foraminal stenosis due to spurring. Other neck: Negative for mass or adenopathy. Upper chest: Pleuroparenchymal scarring in the lung apices bilaterally. Review of the MIP images confirms the above findings CTA HEAD FINDINGS Anterior circulation: Atherosclerotic calcification in the cavernous carotid bilaterally without significant stenosis. Anterior and middle cerebral arteries patent bilaterally without stenosis. Posterior circulation: Both vertebral arteries patent to the basilar without stenosis. Mild atherosclerotic disease distal left vertebral artery. PICA patent bilaterally. Basilar widely patent. Superior cerebellar and posterior cerebral arteries patent bilaterally without stenosis. Venous sinuses: Normal venous enhancement. Anatomic variants: None Review of the MIP images confirms the above findings IMPRESSION: 1. Atrophy and chronic microvascular ischemic change. No acute intracranial abnormality 2. No significant carotid or vertebral artery stenosis in the neck. No significant intracranial stenosis. Negative for large vessel occlusion 3. Advanced cervical spondylosis with multilevel spinal and foraminal stenosis due to spurring. Electronically Signed   By: Franchot Gallo M.D.   On: 06/19/2019 11:57   CT ANGIO  NECK W OR WO CONTRAST  Result Date: 06/19/2019 CLINICAL DATA:  Aphasia.  Rule out stroke.  History of colon cancer. Creatinine was obtained on site at [facility] at [address] Results: Creatinine 0.8 mg/dL. EXAM: CT ANGIOGRAPHY HEAD AND NECK TECHNIQUE:  Multidetector CT imaging of the head and neck was performed using the standard protocol during bolus administration of intravenous contrast. Multiplanar CT image reconstructions and MIPs were obtained to evaluate the vascular anatomy. Carotid stenosis measurements (when applicable) are obtained utilizing NASCET criteria, using the distal internal carotid diameter as the denominator. CONTRAST:  32mL ISOVUE-370 IOPAMIDOL (ISOVUE-370) INJECTION 76% COMPARISON:  MRI head 04/20/2019 FINDINGS: CT HEAD FINDINGS Brain: Mild atrophy unchanged. Mild chronic microvascular ischemic changes in the white matter. No acute infarct, hemorrhage, mass. Vascular: Mild atherosclerotic calcification. Negative for hyperdense vessel Skull: No focal skeletal lesion Sinuses: Paranasal sinuses clear. Orbits: Bilateral cataract surgery.  No orbital mass. Review of the MIP images confirms the above findings CTA NECK FINDINGS Aortic arch: Standard branching. Imaged portion shows no evidence of aneurysm or dissection. No significant stenosis of the major arch vessel origins. Right carotid system: Mild atherosclerotic calcification of the carotid bifurcation without significant stenosis. Left carotid system: Atherosclerotic calcification left carotid bifurcation without significant stenosis. Vertebral arteries: Both vertebral arteries widely patent without significant stenosis. Skeleton: Advanced cervical spondylosis. Spondylolisthesis C2-3, C3-4, C4-5, C5-6. Prominent kyphosis in the cervical spine. Multilevel spinal and foraminal stenosis due to spurring. Other neck: Negative for mass or adenopathy. Upper chest: Pleuroparenchymal scarring in the lung apices bilaterally. Review  of the MIP images confirms the above findings CTA HEAD FINDINGS Anterior circulation: Atherosclerotic calcification in the cavernous carotid bilaterally without significant stenosis. Anterior and middle cerebral arteries patent bilaterally without stenosis. Posterior circulation: Both vertebral arteries patent to the basilar without stenosis. Mild atherosclerotic disease distal left vertebral artery. PICA patent bilaterally. Basilar widely patent. Superior cerebellar and posterior cerebral arteries patent bilaterally without stenosis. Venous sinuses: Normal venous enhancement. Anatomic variants: None Review of the MIP images confirms the above findings IMPRESSION: 1. Atrophy and chronic microvascular ischemic change. No acute intracranial abnormality 2. No significant carotid or vertebral artery stenosis in the neck. No significant intracranial stenosis. Negative for large vessel occlusion 3. Advanced cervical spondylosis with multilevel spinal and foraminal stenosis due to spurring. Electronically Signed   By: Franchot Gallo M.D.   On: 06/19/2019 11:57   DG Knee Complete 4 Views Left  Result Date: 07/05/2019 CLINICAL DATA:  86 year old female with trauma to the left knee. EXAM: LEFT KNEE - COMPLETE 4+ VIEW COMPARISON:  Left knee radiograph dated 10/13/2018. FINDINGS: There is no acute fracture or dislocation. There is mild osteopenia. No significant arthritic changes. There is a small suprapatellar effusion. The soft tissues are unremarkable. Vascular calcifications noted. IMPRESSION: 1. No acute fracture or dislocation. 2. Small suprapatellar effusion. Electronically Signed   By: Anner Crete M.D.   On: 07/05/2019 22:50   DG Knee Complete 4 Views Right  Result Date: 07/05/2019 CLINICAL DATA:  Knee pain EXAM: RIGHT KNEE - COMPLETE 4+ VIEW COMPARISON:  None. FINDINGS: Moderate degenerative changes in the lateral and patellofemoral compartments. No fracture or dislocation is seen. Small suprapatellar knee  joint effusion. IMPRESSION: Moderate degenerative changes with small suprapatellar knee joint effusion. Electronically Signed   By: Julian Hy M.D.   On: 07/05/2019 22:49   LE VENOUS  Result Date: 07/07/2019  Lower Venous DVTStudy Indications: Pain.  Limitations: Clothing restriction, patient on EMT stretcher. Comparison Study: No prior study Performing Technologist: Maudry Mayhew MHA, RDMS, RVT, RDCS  Examination Guidelines: A complete evaluation includes B-mode imaging, spectral Doppler, color Doppler, and power Doppler as needed of all accessible portions of each vessel. Bilateral testing is considered an integral part of a complete examination. Limited  examinations for reoccurring indications may be performed as noted. The reflux portion of the exam is performed with the patient in reverse Trendelenburg.  +---------+---------------+---------+-----------+----------+--------------+ LEFT     CompressibilityPhasicitySpontaneityPropertiesThrombus Aging +---------+---------------+---------+-----------+----------+--------------+ CFV      Full           Yes      Yes                                 +---------+---------------+---------+-----------+----------+--------------+ SFJ      Full                                                        +---------+---------------+---------+-----------+----------+--------------+ FV Prox  Full                                                        +---------+---------------+---------+-----------+----------+--------------+ FV Mid   Full                                                        +---------+---------------+---------+-----------+----------+--------------+ FV DistalFull                                                        +---------+---------------+---------+-----------+----------+--------------+ PFV      Full                                                         +---------+---------------+---------+-----------+----------+--------------+ POP      Full           Yes      Yes                                 +---------+---------------+---------+-----------+----------+--------------+ PTV      Full                                                        +---------+---------------+---------+-----------+----------+--------------+ PERO     Full                                                        +---------+---------------+---------+-----------+----------+--------------+     Summary: LEFT: - There is no evidence of deep vein thrombosis in the lower extremity.  - No  cystic structure found in the popliteal fossa.  *See table(s) above for measurements and observations. Electronically signed by Deitra Mayo MD on 07/07/2019 at 3:25:01 PM.    Final     Assessment/Plan:   1. Acute pain of left knee Improved.  D/C home when cleared by therapy D/C Ultram Avoid over use or other exercises that may exacerbate her knee issues.  Continue scheduled tylenol  2. Leg cramps Discussed with her that we can order labs here to check for electrolytes issues or anemia. If no findings she can f/u with Dr. Mariea Clonts (?statin)  3. Overactive bladder Decided not to take myrbetriq for this issue due to cost. I explained that other meds may have ill effects on her memory and she expressed understanding.   4. Psychophysiological insomnia Continue Remeron 15 mg hqs   5. Mild cognitive impairment with memory loss MMSE - Mini Mental State Exam 07/13/2019 01/28/2018  Orientation to time 5 5  Orientation to Place 5 5  Registration 3 3  Attention/ Calculation 5 5  Recall 3 3  Language- name 2 objects 2 2  Language- repeat 1 1  Language- follow 3 step command 3 3  Language- read & follow direction 1 1  Write a sentence 1 1  Copy design 1 1  Total score 30 30   Continues with short term memory loss not detected on MMSE  6. Chronic atrial fibrillation (HCC) Rate  controlled with digoxin and toprol Continue Eliquis for CVA risk reduction  Irregular on exam today   7. RA Followed by Dr. Amil Amen and on Enbrel.   [facility] for discharge and f/u with Dr. Mariea Clonts in 2-4 weeks  Discharge review, assessment, plan, and coordination took >30 min}.   Patient is being discharged with the following home health services:  PT  Patient is being discharged with the following durable medical equipment:  Walker     Future labs/tests needed:   BMP CBC Mg

## 2019-07-14 DIAGNOSIS — R2689 Other abnormalities of gait and mobility: Secondary | ICD-10-CM | POA: Diagnosis not present

## 2019-07-14 DIAGNOSIS — D649 Anemia, unspecified: Secondary | ICD-10-CM | POA: Diagnosis not present

## 2019-07-14 DIAGNOSIS — M25462 Effusion, left knee: Secondary | ICD-10-CM | POA: Diagnosis not present

## 2019-07-14 DIAGNOSIS — R278 Other lack of coordination: Secondary | ICD-10-CM | POA: Diagnosis not present

## 2019-07-14 DIAGNOSIS — M25562 Pain in left knee: Secondary | ICD-10-CM | POA: Diagnosis not present

## 2019-07-14 DIAGNOSIS — Z79899 Other long term (current) drug therapy: Secondary | ICD-10-CM | POA: Diagnosis not present

## 2019-07-14 DIAGNOSIS — M6289 Other specified disorders of muscle: Secondary | ICD-10-CM | POA: Diagnosis not present

## 2019-07-14 DIAGNOSIS — M170 Bilateral primary osteoarthritis of knee: Secondary | ICD-10-CM | POA: Diagnosis not present

## 2019-07-14 DIAGNOSIS — E860 Dehydration: Secondary | ICD-10-CM | POA: Diagnosis not present

## 2019-07-14 LAB — BASIC METABOLIC PANEL
BUN: 18 (ref 4–21)
Chloride: 104 (ref 99–108)
Creatinine: 0.7 (ref 0.5–1.1)
Glucose: 86
Potassium: 4.5 (ref 3.4–5.3)
Sodium: 139 (ref 137–147)

## 2019-07-14 LAB — CBC AND DIFFERENTIAL
HCT: 32 — AB (ref 36–46)
Hemoglobin: 10.8 — AB (ref 12.0–16.0)
Platelets: 158 (ref 150–399)
WBC: 5.3

## 2019-07-14 LAB — COMPREHENSIVE METABOLIC PANEL: Calcium: 9.1 (ref 8.7–10.7)

## 2019-07-14 LAB — CBC: RBC: 3.07 — AB (ref 3.87–5.11)

## 2019-07-15 ENCOUNTER — Encounter: Payer: Self-pay | Admitting: Internal Medicine

## 2019-07-15 NOTE — Telephone Encounter (Signed)
Routed to Dr Mariea Clonts MD

## 2019-07-16 DIAGNOSIS — R278 Other lack of coordination: Secondary | ICD-10-CM | POA: Diagnosis not present

## 2019-07-16 DIAGNOSIS — M6289 Other specified disorders of muscle: Secondary | ICD-10-CM | POA: Diagnosis not present

## 2019-07-16 DIAGNOSIS — M170 Bilateral primary osteoarthritis of knee: Secondary | ICD-10-CM | POA: Diagnosis not present

## 2019-07-16 DIAGNOSIS — M25462 Effusion, left knee: Secondary | ICD-10-CM | POA: Diagnosis not present

## 2019-07-16 DIAGNOSIS — M25562 Pain in left knee: Secondary | ICD-10-CM | POA: Diagnosis not present

## 2019-07-16 DIAGNOSIS — R2689 Other abnormalities of gait and mobility: Secondary | ICD-10-CM | POA: Diagnosis not present

## 2019-07-21 ENCOUNTER — Encounter: Payer: Self-pay | Admitting: Internal Medicine

## 2019-07-21 DIAGNOSIS — M170 Bilateral primary osteoarthritis of knee: Secondary | ICD-10-CM | POA: Diagnosis not present

## 2019-07-21 DIAGNOSIS — M25562 Pain in left knee: Secondary | ICD-10-CM | POA: Diagnosis not present

## 2019-07-21 DIAGNOSIS — M6289 Other specified disorders of muscle: Secondary | ICD-10-CM | POA: Diagnosis not present

## 2019-07-21 DIAGNOSIS — M25462 Effusion, left knee: Secondary | ICD-10-CM | POA: Diagnosis not present

## 2019-07-21 DIAGNOSIS — R2689 Other abnormalities of gait and mobility: Secondary | ICD-10-CM | POA: Diagnosis not present

## 2019-07-21 DIAGNOSIS — R278 Other lack of coordination: Secondary | ICD-10-CM | POA: Diagnosis not present

## 2019-07-23 DIAGNOSIS — R278 Other lack of coordination: Secondary | ICD-10-CM | POA: Diagnosis not present

## 2019-07-23 DIAGNOSIS — M25562 Pain in left knee: Secondary | ICD-10-CM | POA: Diagnosis not present

## 2019-07-23 DIAGNOSIS — M6289 Other specified disorders of muscle: Secondary | ICD-10-CM | POA: Diagnosis not present

## 2019-07-23 DIAGNOSIS — M170 Bilateral primary osteoarthritis of knee: Secondary | ICD-10-CM | POA: Diagnosis not present

## 2019-07-23 DIAGNOSIS — M25462 Effusion, left knee: Secondary | ICD-10-CM | POA: Diagnosis not present

## 2019-07-23 DIAGNOSIS — R2689 Other abnormalities of gait and mobility: Secondary | ICD-10-CM | POA: Diagnosis not present

## 2019-07-24 DIAGNOSIS — M25462 Effusion, left knee: Secondary | ICD-10-CM | POA: Diagnosis not present

## 2019-07-24 DIAGNOSIS — M6289 Other specified disorders of muscle: Secondary | ICD-10-CM | POA: Diagnosis not present

## 2019-07-24 DIAGNOSIS — M25562 Pain in left knee: Secondary | ICD-10-CM | POA: Diagnosis not present

## 2019-07-24 DIAGNOSIS — R278 Other lack of coordination: Secondary | ICD-10-CM | POA: Diagnosis not present

## 2019-07-24 DIAGNOSIS — M170 Bilateral primary osteoarthritis of knee: Secondary | ICD-10-CM | POA: Diagnosis not present

## 2019-07-24 DIAGNOSIS — R2689 Other abnormalities of gait and mobility: Secondary | ICD-10-CM | POA: Diagnosis not present

## 2019-07-26 ENCOUNTER — Other Ambulatory Visit: Payer: Self-pay | Admitting: Internal Medicine

## 2019-07-27 DIAGNOSIS — M170 Bilateral primary osteoarthritis of knee: Secondary | ICD-10-CM | POA: Diagnosis not present

## 2019-07-27 DIAGNOSIS — S838X2S Sprain of other specified parts of left knee, sequela: Secondary | ICD-10-CM | POA: Diagnosis not present

## 2019-07-27 DIAGNOSIS — M25562 Pain in left knee: Secondary | ICD-10-CM | POA: Diagnosis not present

## 2019-07-27 DIAGNOSIS — M25462 Effusion, left knee: Secondary | ICD-10-CM | POA: Diagnosis not present

## 2019-07-27 DIAGNOSIS — R2689 Other abnormalities of gait and mobility: Secondary | ICD-10-CM | POA: Diagnosis not present

## 2019-07-27 DIAGNOSIS — R278 Other lack of coordination: Secondary | ICD-10-CM | POA: Diagnosis not present

## 2019-07-27 DIAGNOSIS — M6289 Other specified disorders of muscle: Secondary | ICD-10-CM | POA: Diagnosis not present

## 2019-07-27 DIAGNOSIS — M62562 Muscle wasting and atrophy, not elsewhere classified, left lower leg: Secondary | ICD-10-CM | POA: Diagnosis not present

## 2019-07-27 NOTE — Telephone Encounter (Signed)
rx sent to pharmacy by e-script  

## 2019-07-28 DIAGNOSIS — M25462 Effusion, left knee: Secondary | ICD-10-CM | POA: Diagnosis not present

## 2019-07-28 DIAGNOSIS — R278 Other lack of coordination: Secondary | ICD-10-CM | POA: Diagnosis not present

## 2019-07-28 DIAGNOSIS — M6289 Other specified disorders of muscle: Secondary | ICD-10-CM | POA: Diagnosis not present

## 2019-07-28 DIAGNOSIS — M170 Bilateral primary osteoarthritis of knee: Secondary | ICD-10-CM | POA: Diagnosis not present

## 2019-07-28 DIAGNOSIS — R2689 Other abnormalities of gait and mobility: Secondary | ICD-10-CM | POA: Diagnosis not present

## 2019-07-28 DIAGNOSIS — M25562 Pain in left knee: Secondary | ICD-10-CM | POA: Diagnosis not present

## 2019-07-29 ENCOUNTER — Encounter: Payer: Medicare Other | Admitting: Internal Medicine

## 2019-07-30 DIAGNOSIS — R2689 Other abnormalities of gait and mobility: Secondary | ICD-10-CM | POA: Diagnosis not present

## 2019-07-30 DIAGNOSIS — M25562 Pain in left knee: Secondary | ICD-10-CM | POA: Diagnosis not present

## 2019-07-30 DIAGNOSIS — R278 Other lack of coordination: Secondary | ICD-10-CM | POA: Diagnosis not present

## 2019-07-30 DIAGNOSIS — M170 Bilateral primary osteoarthritis of knee: Secondary | ICD-10-CM | POA: Diagnosis not present

## 2019-07-30 DIAGNOSIS — M25462 Effusion, left knee: Secondary | ICD-10-CM | POA: Diagnosis not present

## 2019-07-30 DIAGNOSIS — M6289 Other specified disorders of muscle: Secondary | ICD-10-CM | POA: Diagnosis not present

## 2019-08-04 DIAGNOSIS — R278 Other lack of coordination: Secondary | ICD-10-CM | POA: Diagnosis not present

## 2019-08-04 DIAGNOSIS — M6289 Other specified disorders of muscle: Secondary | ICD-10-CM | POA: Diagnosis not present

## 2019-08-04 DIAGNOSIS — M25562 Pain in left knee: Secondary | ICD-10-CM | POA: Diagnosis not present

## 2019-08-04 DIAGNOSIS — M170 Bilateral primary osteoarthritis of knee: Secondary | ICD-10-CM | POA: Diagnosis not present

## 2019-08-04 DIAGNOSIS — R2689 Other abnormalities of gait and mobility: Secondary | ICD-10-CM | POA: Diagnosis not present

## 2019-08-04 DIAGNOSIS — M25462 Effusion, left knee: Secondary | ICD-10-CM | POA: Diagnosis not present

## 2019-08-05 ENCOUNTER — Non-Acute Institutional Stay: Payer: Medicare Other | Admitting: Internal Medicine

## 2019-08-05 ENCOUNTER — Encounter: Payer: Self-pay | Admitting: Internal Medicine

## 2019-08-05 ENCOUNTER — Other Ambulatory Visit: Payer: Self-pay

## 2019-08-05 VITALS — BP 126/82 | HR 100 | Temp 98.4°F | Ht 62.0 in | Wt 126.2 lb

## 2019-08-05 DIAGNOSIS — F5104 Psychophysiologic insomnia: Secondary | ICD-10-CM

## 2019-08-05 DIAGNOSIS — N3281 Overactive bladder: Secondary | ICD-10-CM

## 2019-08-05 DIAGNOSIS — R152 Fecal urgency: Secondary | ICD-10-CM | POA: Diagnosis not present

## 2019-08-05 DIAGNOSIS — M25562 Pain in left knee: Secondary | ICD-10-CM

## 2019-08-05 DIAGNOSIS — M25561 Pain in right knee: Secondary | ICD-10-CM | POA: Diagnosis not present

## 2019-08-05 DIAGNOSIS — Z66 Do not resuscitate: Secondary | ICD-10-CM | POA: Diagnosis not present

## 2019-08-05 DIAGNOSIS — I482 Chronic atrial fibrillation, unspecified: Secondary | ICD-10-CM

## 2019-08-05 DIAGNOSIS — G3184 Mild cognitive impairment, so stated: Secondary | ICD-10-CM | POA: Diagnosis not present

## 2019-08-05 DIAGNOSIS — K5909 Other constipation: Secondary | ICD-10-CM

## 2019-08-05 DIAGNOSIS — R159 Full incontinence of feces: Secondary | ICD-10-CM | POA: Diagnosis not present

## 2019-08-05 DIAGNOSIS — G8929 Other chronic pain: Secondary | ICD-10-CM

## 2019-08-05 NOTE — Progress Notes (Signed)
Location:   [facility] of Service:   Clinic  Provider: Ambrea Hegler L. Mariea Clonts, D.O., C.M.D.  Code Status: DNR Goals of Care:  Advanced Directives 07/08/2019  Does Patient Have a Medical Advance Directive? Yes  Type of Advance Directive [place]  Does patient want to make changes to medical advance directive? No - Patient declined  Copy of [place] in Chart? Yes - validated most recent copy scanned in chart (See row information)  Would patient like information on creating a medical advance directive? -     Chief Complaint  Patient presents with  . Medical Management of Chronic Issues    Follow up from rehab     HPI: Patient is a 86 y.o. female seen today for medical management of chronic diseases--follow-up from rehab stay.    She had her swim class.  She had a tight schedule.  She left a little early and thought she had time, then could not find her purse.  She looked several places and could not find it.  She'd left her purse in the apt.  She was all worked up.  HR was 100.  Things are going ok back home.  She had therapy with Thamas Jaegers and he was there yesterday.  Says things are going well.  Knee is slowly getting better.  Doing her PT and icing a couple times per day.  She is to continue to use the walker which she does not like.  She is trying to be adherent and she's getting used to it.   Overactive bladder.  Thinks it's a little better.    On the odd occasion, the bowel is like the faucet got turned on.  She has increased her fiber doing the all bran cereal mostly for breakfast.  In the dining room, she chooses a main meat (seafood more) and two veggies and salads, fruits.  She is eating three meals per day.  Soup and salad at lunch or dinner.  Doing bran cereal 3-4 times per week.  She does not think it's helped her stool much.  [name] also taking the fibercon.  Has not been constipated.  She is now saying that her bowels are not actually  that runny.  There is an urgency to it.  She also used a suppository this morning before she went to all of her activity today.    She is still not drinking enough water.  She is trying to get 6 glasses in the day and avoiding after supper.  She is making it to 4-5am before she has to urinate.    She says she had a bad incident last week when talking to her grandson.  She had forgotten that his other grandmother had passed away around thanksgiving.    She'd not been in communication with her herself.  She does not remember that Hosie Spangle told her nor that she'd told a friend of hers.  We talked about asking for help when needed.  Her daughter is also helping to remind about appts.  There have been some duplicate payments and items on credit cards that should not be there.  Driving is limited to [place] 3 miles away, the hairdresser and grocery store.  No unfamiliar places.    Past Medical History:  Diagnosis Date  . Anemia   . Anxiety   . Arthritis    Back  . Atrial fibrillation ([place])   . Cancer [facility]) 2006   Colon.  Basal  Cell Skin cancer- right arm  . Colon cancer ([place])   . Constipation due to pain medication therapy    after heart surgery  . Dysrhythmia    PAF  . GERD (gastroesophageal reflux disease)   . Heart murmur   . Hypertension   . Hypothyroidism   . RA (rheumatoid arthritis) ([place])   . Restless leg   . Seizures ([place])    after Heart Surgey  . Stroke [facility])    TIA- found by neurologist after     Past Surgical History:  Procedure Laterality Date  . ABDOMINAL HYSTERECTOMY  1970   Partial   . COLON RESECTION  2006   cancer  . COLONOSCOPY    . EYE SURGERY Bilateral    Cataract  . MAXIMUM ACCESS (MAS)POSTERIOR LUMBAR INTERBODY FUSION (PLIF) 2 LEVEL N/A 04/12/2015   Procedure: Lumbar Three-Five Decompression, Pedicle Screw Fixation, and Posteriolateral Arthrodesis;  Surgeon: Erline Levine, MD;  Location: [facility];  Service: Neurosurgery;  Laterality: N/A;  L3-4 L4-5  Maximum access posterior lumbar fusion, possible interbodies and resection of synovial cyst at L4-5  . MITRAL VALVE REPAIR  01/20/13   Gore-tex cords to P1, P2, and P3. Magic suture to posterior medial commisure, #30 Physio 1 ring. Done in [place]  . TONSILLECTOMY     about 1940  . TRICUSPID VALVE SURGERY  01/20/13   #28 TriAd ring done in [place]    Allergies  Allergen Reactions  . Codeine Other (See Comments)    "just don't take it well"  . Molds & Smuts   . Pollen Extract Swelling  . Bee Venom Swelling and Rash    Outpatient Encounter Medications as of 08/05/2019  Medication Sig  . acetaminophen (TYLENOL) 500 MG tablet Take 1,000 mg by mouth in the morning and at bedtime.   Marland Kitchen aspirin EC 81 MG tablet Take 81 mg by mouth daily.  Marland Kitchen atorvastatin (LIPITOR) 20 MG tablet TAKE 1 TABLET DAILY  . Azelastine-Fluticasone 137-50 MCG/ACT SUSP Place 1 spray into the nose daily as needed (allergies).   . Biotin 5000 MCG CAPS Take 1 capsule by mouth daily.  . cholecalciferol (VITAMIN D) 1000 units tablet Take 1,000 Units by mouth daily.  . digoxin (LANOXIN) 0.125 MG tablet TAKE 1 TABLET DAILY  . ELIQUIS 2.5 MG TABS tablet TAKE 1 TABLET TWICE A DAY  . ENBREL SURECLICK 50 MG/ML injection   . EPINEPHrine 0.3 mg/0.3 mL IJ SOAJ injection Inject 0.3 mLs (0.3 mg total) into the muscle as needed for anaphylaxis.  Marland Kitchen ipratropium (ATROVENT) 0.03 % nasal spray ipratropium bromide 21 mcg (0.03 %) nasal spray  . magnesium oxide (MAG-OX) 400 MG tablet Take 400 mg by mouth daily.  . Melatonin 5 MG TABS Take 1 tablet by mouth at bedtime as needed.  . metoprolol succinate (TOPROL XL) 50 MG 24 hr tablet Take 1 tablet (50 mg total) by mouth daily. Take with or immediately following a meal.  . mirtazapine (REMERON) 15 MG tablet Take 1 tablet (15 mg total) by mouth at bedtime.  . Multiple Vitamins-Minerals (PRESERVISION AREDS 2) CAPS Take 1 Dose by mouth 2 (two) times daily. For eye health  . omeprazole (PRILOSEC) 40  MG capsule Take 1 capsule (40 mg total) by mouth daily.  . polycarbophil (FIBERCON) 625 MG tablet Take 625 mg by mouth daily.  . Probiotic Product (ALIGN) 4 MG CAPS Take 1 capsule by mouth daily.  Marland Kitchen SYNTHROID 75 MCG tablet TAKE 1 TABLET DAILY BEFORE BREAKFAST FOR HYPOTHYROIDISM  .  valsartan (DIOVAN) 80 MG tablet Take 80 mg by mouth daily.  . vitamin B-12 (CYANOCOBALAMIN) 1000 MCG tablet Take 1,000 mcg by mouth daily.    No facility-administered encounter medications on file as of 08/05/2019.    Review of Systems:  Review of Systems  Constitutional: Negative for chills, fever and malaise/fatigue.  HENT: Positive for hearing loss. Negative for sore throat.   Eyes: Negative for blurred vision.  Respiratory: Negative for cough and shortness of breath.   Cardiovascular: Negative for chest pain, palpitations and leg swelling.  Gastrointestinal: Negative for abdominal pain, blood in stool, constipation, diarrhea and melena.       Bms sometimes looser side and incontinent on rare occasion  Genitourinary: Positive for urgency. Negative for dysuria.  Musculoskeletal: Positive for joint pain. Negative for falls.  Skin: Negative for itching and rash.  Neurological: Negative for dizziness and loss of consciousness.  Endo/Heme/Allergies: Bruises/bleeds easily.  Psychiatric/Behavioral: Positive for memory loss. Negative for depression. The patient is not nervous/anxious and does not have insomnia.     Health Maintenance  Topic Date Due  . INFLUENZA VACCINE  10/25/2019  . TETANUS/TDAP  04/16/2027  . DEXA SCAN  Completed  . COVID-19 Vaccine  Completed  . PNA vac Low Risk Adult  Completed    Physical Exam: Vitals:   08/05/19 1440  BP: 126/82  Pulse: 100  Temp: 98.4 F (36.9 C)  TempSrc: Temporal  SpO2: 94%  Weight: 126 lb 3.2 oz (57.2 kg)  Height: 5\' 2"  (1.575 m)   Body mass index is 23.08 kg/m. Physical Exam  Labs reviewed: Basic Metabolic Panel: Recent Labs    02/03/19 0000  04/20/19 1156 07/14/19 0600  NA 138 137 139  K 5.1 4.5 4.5  CL 100 100 104  CO2 23* 26  --   GLUCOSE  --  110*  --   BUN 20 15 18   CREATININE 0.8 0.93 0.7  CALCIUM 9.3 9.0 9.1  TSH  --  2.309  --    Liver Function Tests: Recent Labs    02/03/19 0000 04/20/19 1156  AST 23 27  ALT 19 23  ALKPHOS 86 71  BILITOT  --  1.0  PROT  --  6.9  ALBUMIN 4.6 4.3   No results for input(s): LIPASE, AMYLASE in the last 8760 hours. No results for input(s): AMMONIA in the last 8760 hours. CBC: Recent Labs    02/03/19 0000 04/20/19 1156 07/14/19 0600  WBC 5.6 5.9 5.3  NEUTROABS  --  2.9  --   HGB 11.7* 12.3 10.8*  HCT 35* 36.8 32*  MCV  --  104.8*  --   PLT  --  163 158   Lipid Panel: Recent Labs    02/03/19 0000  CHOL 134  HDL 54  LDLCALC 58  TRIG 110   Lab Results  Component Value Date   HGBA1C 5.5 01/12/2013    Assessment/Plan 1. Acute pain of left knee -resolved, cont therapy exercises and avoiding serial exercise programs--sticking with therapy and swimming at her advanced age and with her RA and OA  2. Mild cognitive impairment with memory loss -is becoming more notable and she's accepting and requesting help more from family--discussed dangers of groups taking advantage of older adults for their money  -has many many meds so would not recommend adding aricept or namenda to her long list  3. Psychophysiological insomnia -sleeping better through the night with remeron 15mg  qhs  4. Overactive bladder -continue to hydrate with 6  8oz glasses of water in days and stop at supper which has allowed her to sleep better through the night  5. Chronic atrial fibrillation (HCC) -rate controlled, HR not accurate at 100 (was with pulse ox--better when rechecked manually during exam and after she calmed down--see hpi)  6. Chronic pain of right knee -stable, doing her PT and swimming  7. Chronic constipation -this is better with fibercon and fiber in diet plus improving  hydration  8. Incontinence of feces with fecal urgency -rare, unclear if this has improved with more fiber to bulk stool  9. DNR (do not resuscitate) - Do not attempt resuscitation (DNR) order entered into epic (already scanned in vynca from rehab stay)   Labs/tests ordered:  Cbc, bmp before Next appt:  12/02/2019  Marcy Sookdeo L. Emitt Maglione, D.O. [facility] [address], [place] [address] Cell Phone (Mon-Fri 8am-5pm):  [phone] On Call:  [phone] & follow prompts after 5pm & weekends Office Phone:  [phone] Office Fax:  [phone]

## 2019-08-06 DIAGNOSIS — M25462 Effusion, left knee: Secondary | ICD-10-CM | POA: Diagnosis not present

## 2019-08-06 DIAGNOSIS — M170 Bilateral primary osteoarthritis of knee: Secondary | ICD-10-CM | POA: Diagnosis not present

## 2019-08-06 DIAGNOSIS — M6289 Other specified disorders of muscle: Secondary | ICD-10-CM | POA: Diagnosis not present

## 2019-08-06 DIAGNOSIS — R2689 Other abnormalities of gait and mobility: Secondary | ICD-10-CM | POA: Diagnosis not present

## 2019-08-06 DIAGNOSIS — R278 Other lack of coordination: Secondary | ICD-10-CM | POA: Diagnosis not present

## 2019-08-06 DIAGNOSIS — M25562 Pain in left knee: Secondary | ICD-10-CM | POA: Diagnosis not present

## 2019-08-12 ENCOUNTER — Telehealth: Payer: Self-pay

## 2019-08-12 MED ORDER — AZELASTINE-FLUTICASONE 137-50 MCG/ACT NA SUSP: 1.0000 | Freq: Every day | NASAL | 3 refills | Status: DC | PRN

## 2019-08-12 MED ORDER — IPRATROPIUM BROMIDE 0.03 % NA SOLN: 1.0000 | Freq: Two times a day (BID) | NASAL | 3 refills | Status: DC

## 2019-08-12 MED ORDER — CALCIUM POLYCARBOPHIL 625 MG PO TABS: 625.0000 mg | ORAL_TABLET | Freq: Every day | ORAL | 3 refills | Status: DC

## 2019-08-12 MED ORDER — VALSARTAN 80 MG PO TABS: 80.0000 mg | ORAL_TABLET | Freq: Every day | ORAL | 1 refills | Status: DC

## 2019-08-12 NOTE — Telephone Encounter (Signed)
Kathy Velez is going out of town and needs permission for her [facility]s to [facility]. [name] leaving town tomorrow and won't be back until June 1st. [facility] and [facility] just need refills on a few med to be sent in for them to fill. E

## 2019-08-18 ENCOUNTER — Other Ambulatory Visit: Payer: Self-pay | Admitting: *Deleted

## 2019-08-18 DIAGNOSIS — F5101 Primary insomnia: Secondary | ICD-10-CM

## 2019-08-18 MED ORDER — MIRTAZAPINE 15 MG PO TABS: 15.0000 mg | ORAL_TABLET | Freq: Every day | ORAL | 1 refills | Status: DC

## 2019-08-18 NOTE — Telephone Encounter (Signed)
[employer]

## 2019-08-28 ENCOUNTER — Other Ambulatory Visit: Payer: Self-pay

## 2019-08-28 MED ORDER — OMEPRAZOLE 40 MG PO CPDR: 40.0000 mg | DELAYED_RELEASE_CAPSULE | Freq: Every day | ORAL | 3 refills | Status: DC

## 2019-08-28 NOTE — Telephone Encounter (Signed)
rx sent to pharmacy by e-script  

## 2019-09-03 ENCOUNTER — Encounter: Payer: Self-pay | Admitting: Internal Medicine

## 2019-09-24 DIAGNOSIS — M150 Primary generalized (osteo)arthritis: Secondary | ICD-10-CM | POA: Diagnosis not present

## 2019-09-24 DIAGNOSIS — M5136 Other intervertebral disc degeneration, lumbar region: Secondary | ICD-10-CM | POA: Diagnosis not present

## 2019-09-24 DIAGNOSIS — M0579 Rheumatoid arthritis with rheumatoid factor of multiple sites without organ or systems involvement: Secondary | ICD-10-CM | POA: Diagnosis not present

## 2019-09-24 DIAGNOSIS — Z6823 Body mass index (BMI) 23.0-23.9, adult: Secondary | ICD-10-CM | POA: Diagnosis not present

## 2019-09-26 DIAGNOSIS — Z85038 Personal history of other malignant neoplasm of large intestine: Secondary | ICD-10-CM | POA: Diagnosis not present

## 2019-09-26 DIAGNOSIS — R4182 Altered mental status, unspecified: Secondary | ICD-10-CM | POA: Diagnosis not present

## 2019-09-26 DIAGNOSIS — Z79890 Hormone replacement therapy (postmenopausal): Secondary | ICD-10-CM | POA: Diagnosis not present

## 2019-09-26 DIAGNOSIS — Z8673 Personal history of transient ischemic attack (TIA), and cerebral infarction without residual deficits: Secondary | ICD-10-CM | POA: Diagnosis not present

## 2019-09-26 DIAGNOSIS — Z7901 Long term (current) use of anticoagulants: Secondary | ICD-10-CM | POA: Diagnosis not present

## 2019-09-26 DIAGNOSIS — I10 Essential (primary) hypertension: Secondary | ICD-10-CM | POA: Diagnosis not present

## 2019-09-26 DIAGNOSIS — Z7982 Long term (current) use of aspirin: Secondary | ICD-10-CM | POA: Diagnosis not present

## 2019-09-26 DIAGNOSIS — G459 Transient cerebral ischemic attack, unspecified: Secondary | ICD-10-CM | POA: Diagnosis not present

## 2019-09-26 DIAGNOSIS — E039 Hypothyroidism, unspecified: Secondary | ICD-10-CM | POA: Diagnosis not present

## 2019-09-26 DIAGNOSIS — R4701 Aphasia: Secondary | ICD-10-CM | POA: Diagnosis not present

## 2019-09-26 DIAGNOSIS — Z79899 Other long term (current) drug therapy: Secondary | ICD-10-CM | POA: Diagnosis not present

## 2019-09-26 DIAGNOSIS — I639 Cerebral infarction, unspecified: Secondary | ICD-10-CM | POA: Diagnosis not present

## 2019-09-26 DIAGNOSIS — R5381 Other malaise: Secondary | ICD-10-CM | POA: Diagnosis not present

## 2019-09-26 DIAGNOSIS — Z885 Allergy status to narcotic agent status: Secondary | ICD-10-CM | POA: Diagnosis not present

## 2019-09-27 DIAGNOSIS — I6389 Other cerebral infarction: Secondary | ICD-10-CM | POA: Diagnosis not present

## 2019-09-27 DIAGNOSIS — G9340 Encephalopathy, unspecified: Secondary | ICD-10-CM | POA: Diagnosis not present

## 2019-09-27 DIAGNOSIS — M069 Rheumatoid arthritis, unspecified: Secondary | ICD-10-CM | POA: Diagnosis not present

## 2019-09-27 DIAGNOSIS — R471 Dysarthria and anarthria: Secondary | ICD-10-CM | POA: Diagnosis not present

## 2019-09-27 DIAGNOSIS — I6789 Other cerebrovascular disease: Secondary | ICD-10-CM | POA: Diagnosis not present

## 2019-09-27 DIAGNOSIS — R4781 Slurred speech: Secondary | ICD-10-CM | POA: Diagnosis not present

## 2019-09-27 DIAGNOSIS — Z885 Allergy status to narcotic agent status: Secondary | ICD-10-CM | POA: Diagnosis not present

## 2019-09-27 DIAGNOSIS — I10 Essential (primary) hypertension: Secondary | ICD-10-CM | POA: Diagnosis not present

## 2019-09-27 DIAGNOSIS — Z7901 Long term (current) use of anticoagulants: Secondary | ICD-10-CM | POA: Diagnosis not present

## 2019-09-27 DIAGNOSIS — I482 Chronic atrial fibrillation, unspecified: Secondary | ICD-10-CM | POA: Diagnosis not present

## 2019-09-27 DIAGNOSIS — R030 Elevated blood-pressure reading, without diagnosis of hypertension: Secondary | ICD-10-CM | POA: Diagnosis not present

## 2019-09-27 DIAGNOSIS — I340 Nonrheumatic mitral (valve) insufficiency: Secondary | ICD-10-CM | POA: Diagnosis not present

## 2019-09-27 DIAGNOSIS — I4891 Unspecified atrial fibrillation: Secondary | ICD-10-CM | POA: Diagnosis not present

## 2019-09-27 DIAGNOSIS — R4701 Aphasia: Secondary | ICD-10-CM | POA: Diagnosis not present

## 2019-09-27 DIAGNOSIS — Z85038 Personal history of other malignant neoplasm of large intestine: Secondary | ICD-10-CM | POA: Diagnosis not present

## 2019-09-27 DIAGNOSIS — Z8673 Personal history of transient ischemic attack (TIA), and cerebral infarction without residual deficits: Secondary | ICD-10-CM | POA: Diagnosis not present

## 2019-09-27 DIAGNOSIS — R531 Weakness: Secondary | ICD-10-CM | POA: Diagnosis not present

## 2019-09-27 DIAGNOSIS — G459 Transient cerebral ischemic attack, unspecified: Secondary | ICD-10-CM | POA: Diagnosis not present

## 2019-09-27 DIAGNOSIS — I6502 Occlusion and stenosis of left vertebral artery: Secondary | ICD-10-CM | POA: Diagnosis not present

## 2019-09-27 DIAGNOSIS — E039 Hypothyroidism, unspecified: Secondary | ICD-10-CM | POA: Diagnosis not present

## 2019-09-27 DIAGNOSIS — Z79890 Hormone replacement therapy (postmenopausal): Secondary | ICD-10-CM | POA: Diagnosis not present

## 2019-09-27 DIAGNOSIS — Z743 Need for continuous supervision: Secondary | ICD-10-CM | POA: Diagnosis not present

## 2019-09-27 DIAGNOSIS — I517 Cardiomegaly: Secondary | ICD-10-CM | POA: Diagnosis not present

## 2019-09-27 DIAGNOSIS — I639 Cerebral infarction, unspecified: Secondary | ICD-10-CM | POA: Diagnosis not present

## 2019-09-27 DIAGNOSIS — Z66 Do not resuscitate: Secondary | ICD-10-CM | POA: Diagnosis not present

## 2019-09-27 DIAGNOSIS — Z7982 Long term (current) use of aspirin: Secondary | ICD-10-CM | POA: Diagnosis not present

## 2019-09-27 DIAGNOSIS — R29703 NIHSS score 3: Secondary | ICD-10-CM | POA: Diagnosis not present

## 2019-09-27 DIAGNOSIS — Z79899 Other long term (current) drug therapy: Secondary | ICD-10-CM | POA: Diagnosis not present

## 2019-09-29 ENCOUNTER — Telehealth: Payer: Self-pay | Admitting: Neurology

## 2019-09-29 NOTE — Telephone Encounter (Signed)
AccessNurse 09/26/19 @ 5:25PM:  "Caller states her mother had a possible mini stroke. They are out of town and they're talking about transferring her to [facility] for an MRI, and she wants to know if that is necessary. This happened 6 months ago and the tests didn't show any stroke activity. She is already on blood thinners and she was told this is the treatment for strokes. She wants to speak with the on call.  Nurse assessment: Caller states her mother had a possible mini stroke. They are out of town and they're talking about transferring her to [facility] for an MRI, and she wants to know if that is necessary. This happened 6 months ago and the tests didn't show any stroke activity. She is already on blood thinners and she was told that this is the treatment for strokes. She wants to speak with the on call. She had a CAT scan, slurred speech/confusion. She has improved- speech is back, balance is back. They want to transfer to [place] for another MRI. She is not in the ED currently. They are on vacation, 5 hours away from regular neurologist.   On-call provider was paged and reached - Dr. Carles Collet.  Informed on call provider per daughters report patient is back to baseline, and CAT scan done in the ED was normal, is there a need for emergent MRI to be done at this time. Per provider if patient is back to baseline and no new symptoms, may continue to monitor at home. Call office Tuesday morning for update and to schedule with provider. Should any stroke symptoms return patient should be taken to the nearest ED for evaluation. RN spoke with caller she voiced understanding no questions at this time."

## 2019-09-29 NOTE — Telephone Encounter (Signed)
Telephone call to pt daughter, Per Daughter pt had to be taken back to the ED on 09/27/19. Per Daughter pt starting having Slurred speech and confusion again on 09/27/19 at 2 pm. Pt was transferred to [facility] Stroke clinic to have a MRI done at 5 am this morning.   Per Daughter they think pt had multiple TIA's. Pt speech has gotten better but pt is still confused. Pt still in the hospital right now.  Advised pt daughter to fill out a Records release so the hospital could send everything over to Dr. Tomi Likens to review.

## 2019-10-01 ENCOUNTER — Telehealth: Payer: Self-pay | Admitting: Neurology

## 2019-10-01 ENCOUNTER — Encounter: Payer: Self-pay | Admitting: Internal Medicine

## 2019-10-01 NOTE — Telephone Encounter (Signed)
Daughter advised of DR. Jaffe note.

## 2019-10-01 NOTE — Telephone Encounter (Signed)
Pharmacy will defer to MD to determine best time to restart Eliquis.  CHADS2-VASc score is 6 (age x 2, stroke x 2, htn, female)

## 2019-10-01 NOTE — Telephone Encounter (Signed)
After reviewing the hospital records, she may resume Eliquis and stop aspirin tomorrow.

## 2019-10-01 NOTE — Telephone Encounter (Signed)
Patient's daughter Hosie Spangle called in to update Dr. Tomi Likens on an episode the patient had in [place]. She went to the hospital and the MRI showed stroke activity. She would like to get a consensus on when the patient should start back on her Eliquis. The neurologist in [place] stated to wait a week.

## 2019-10-01 NOTE — Telephone Encounter (Signed)
Daughter notified of recommendations by Dr. Georgie Chard CMA

## 2019-10-02 DIAGNOSIS — M170 Bilateral primary osteoarthritis of knee: Secondary | ICD-10-CM | POA: Diagnosis not present

## 2019-10-02 DIAGNOSIS — M6389 Disorders of muscle in diseases classified elsewhere, multiple sites: Secondary | ICD-10-CM | POA: Diagnosis not present

## 2019-10-02 DIAGNOSIS — Z8673 Personal history of transient ischemic attack (TIA), and cerebral infarction without residual deficits: Secondary | ICD-10-CM | POA: Diagnosis not present

## 2019-10-02 DIAGNOSIS — R2689 Other abnormalities of gait and mobility: Secondary | ICD-10-CM | POA: Diagnosis not present

## 2019-10-02 DIAGNOSIS — G8929 Other chronic pain: Secondary | ICD-10-CM | POA: Diagnosis not present

## 2019-10-02 DIAGNOSIS — R41841 Cognitive communication deficit: Secondary | ICD-10-CM | POA: Diagnosis not present

## 2019-10-02 DIAGNOSIS — G3184 Mild cognitive impairment, so stated: Secondary | ICD-10-CM | POA: Diagnosis not present

## 2019-10-02 DIAGNOSIS — R278 Other lack of coordination: Secondary | ICD-10-CM | POA: Diagnosis not present

## 2019-10-02 DIAGNOSIS — G458 Other transient cerebral ischemic attacks and related syndromes: Secondary | ICD-10-CM | POA: Diagnosis not present

## 2019-10-05 DIAGNOSIS — R41841 Cognitive communication deficit: Secondary | ICD-10-CM | POA: Diagnosis not present

## 2019-10-05 DIAGNOSIS — M6389 Disorders of muscle in diseases classified elsewhere, multiple sites: Secondary | ICD-10-CM | POA: Diagnosis not present

## 2019-10-05 DIAGNOSIS — Z8673 Personal history of transient ischemic attack (TIA), and cerebral infarction without residual deficits: Secondary | ICD-10-CM | POA: Diagnosis not present

## 2019-10-05 DIAGNOSIS — R278 Other lack of coordination: Secondary | ICD-10-CM | POA: Diagnosis not present

## 2019-10-05 DIAGNOSIS — R2689 Other abnormalities of gait and mobility: Secondary | ICD-10-CM | POA: Diagnosis not present

## 2019-10-05 DIAGNOSIS — G458 Other transient cerebral ischemic attacks and related syndromes: Secondary | ICD-10-CM | POA: Diagnosis not present

## 2019-10-06 ENCOUNTER — Non-Acute Institutional Stay (SKILLED_NURSING_FACILITY): Payer: Medicare Other | Admitting: Internal Medicine

## 2019-10-06 ENCOUNTER — Encounter: Payer: Self-pay | Admitting: Internal Medicine

## 2019-10-06 DIAGNOSIS — R2689 Other abnormalities of gait and mobility: Secondary | ICD-10-CM | POA: Diagnosis not present

## 2019-10-06 DIAGNOSIS — M06049 Rheumatoid arthritis without rheumatoid factor, unspecified hand: Secondary | ICD-10-CM | POA: Diagnosis not present

## 2019-10-06 DIAGNOSIS — Z8673 Personal history of transient ischemic attack (TIA), and cerebral infarction without residual deficits: Secondary | ICD-10-CM

## 2019-10-06 DIAGNOSIS — I482 Chronic atrial fibrillation, unspecified: Secondary | ICD-10-CM | POA: Diagnosis not present

## 2019-10-06 DIAGNOSIS — R41841 Cognitive communication deficit: Secondary | ICD-10-CM | POA: Diagnosis not present

## 2019-10-06 DIAGNOSIS — Z66 Do not resuscitate: Secondary | ICD-10-CM

## 2019-10-06 DIAGNOSIS — N3281 Overactive bladder: Secondary | ICD-10-CM

## 2019-10-06 DIAGNOSIS — K5909 Other constipation: Secondary | ICD-10-CM | POA: Diagnosis not present

## 2019-10-06 DIAGNOSIS — M6389 Disorders of muscle in diseases classified elsewhere, multiple sites: Secondary | ICD-10-CM | POA: Diagnosis not present

## 2019-10-06 DIAGNOSIS — F5104 Psychophysiologic insomnia: Secondary | ICD-10-CM

## 2019-10-06 DIAGNOSIS — R278 Other lack of coordination: Secondary | ICD-10-CM | POA: Diagnosis not present

## 2019-10-06 DIAGNOSIS — G3184 Mild cognitive impairment, so stated: Secondary | ICD-10-CM

## 2019-10-06 DIAGNOSIS — G458 Other transient cerebral ischemic attacks and related syndromes: Secondary | ICD-10-CM | POA: Diagnosis not present

## 2019-10-06 DIAGNOSIS — M170 Bilateral primary osteoarthritis of knee: Secondary | ICD-10-CM | POA: Diagnosis not present

## 2019-10-06 DIAGNOSIS — I10 Essential (primary) hypertension: Secondary | ICD-10-CM | POA: Diagnosis not present

## 2019-10-06 NOTE — Progress Notes (Addendum)
Provider:  Rexene Edison. Mariea Clonts, D.O., C.M.D. Location:  [place] Room Number: 153/A Place of Service:  SNF (31)  PCP: Gayland Curry, DO Patient Care Team: Gayland Curry, DO as PCP - General (Geriatric Medicine) Pixie Casino, MD as PCP - Cardiology (Cardiology) Pieter Partridge, DO as Consulting Physician (Neurology)  Extended Emergency Contact Information Primary Emergency Contact: [name],[name] Address: [place], [place] [address] [address] of [place] Phone: [phone] Relation: Daughter  Code Status: DNR Goals of Care: Advanced Directive information Advanced Directives 10/06/2019  Does Patient Have a Medical Advance Directive? Yes  Type of Advance Directive Out of facility DNR (pink MOST or yellow form)  Does patient want to make changes to medical advance directive? No - Patient declined  Copy of [place] in Chart? -  Would patient like information on creating a medical advance directive? -  Pre-existing out of facility DNR order (yellow form or pink MOST form) Pink MOST/Yellow Form most recent copy in chart - Physician notified to receive inpatient order   Chief Complaint  Patient presents with  . New Admit To SNF    Admit to Rehab     HPI: Patient is a 86 y.o. female seen today for admission to [facility] s/p hospitalization while in [place] on vacation.   Mrs. Aho has a history of prior stroke transient ischemic attack, A. fib hypothyroidism, depression, insomnia, hypertension, GERD and rheumatoid arthritis.  She had presented to the emergency room in [facility] on September 28, 2019 with right-sided weakness and difficulty talking.  She had also been confused.  Patient had slurred speech and difficulty finding words while in the emergency room but have been discharged home.  She returned when the symptoms came back on the sixth.  CT of the brain did not show any bleeding.   CTA of the head and neck showed atherosclerosis of both carotid arteries but no large intracranial vessel occlusion.  Neurology had seen her and determined that the right sided sensory deficits and moderate to severe aphasia were likely due to lacunar stroke versus TIA.  The MRI did return showing a focal area of acute infarction in the left frontal corona radiata.  She also had areas of remote lacunar infarction in the right cerebellum.  She was evaluated by speech therapy and returns to [place] on a regular diet with thin liquids.  Her neurologic symptoms have all improved.  Labs from her hospitalization were reviewed and included a mildly low sodium of 134, normal GFR, mild anemia with hemoglobin 11.8 and hematocrit 34.2.  CODE STATUS remains DNR/DNI.  Upon her return, her daughter initiated a discussion amongst myself her cardiologist and her neurologist.  Dr. Debara Pickett, Dr. Tomi Likens, and myself all agreed to resume the Eliquis and hold the aspirin. D/c summary had indicated two more days of asa and then eliquis initially.  Mrs. Odle is now here in rehab for PT OT and ST.  When seen, she was almost back to her baseline, just moving more slowly.  Spoke with PT and we both feel that she is borderline IL with caregivers vs AL.   Past Medical History:  Diagnosis Date  . Anemia   . Anxiety   . Arthritis    Back  . Atrial fibrillation ([place])   . Cancer [facility]) 2006   Colon.  Basal Cell Skin cancer- right arm  . Colon cancer (  HCC)   . Constipation due to pain medication therapy    after heart surgery  . Dysrhythmia    PAF  . GERD (gastroesophageal reflux disease)   . Heart murmur   . Hypertension   . Hypothyroidism   . RA (rheumatoid arthritis) ([place])   . Restless leg   . Seizures ([place])    after Heart Surgey  . Stroke [facility])    TIA- found by neurologist after    Past Surgical History:  Procedure Laterality Date  . ABDOMINAL HYSTERECTOMY  1970   Partial   . COLON RESECTION  2006   cancer  .  COLONOSCOPY    . EYE SURGERY Bilateral    Cataract  . MAXIMUM ACCESS (MAS)POSTERIOR LUMBAR INTERBODY FUSION (PLIF) 2 LEVEL N/A 04/12/2015   Procedure: Lumbar Three-Five Decompression, Pedicle Screw Fixation, and Posteriolateral Arthrodesis;  Surgeon: Erline Levine, MD;  Location: [facility];  Service: Neurosurgery;  Laterality: N/A;  L3-4 L4-5 Maximum access posterior lumbar fusion, possible interbodies and resection of synovial cyst at L4-5  . MITRAL VALVE REPAIR  01/20/13   Gore-tex cords to P1, P2, and P3. Magic suture to posterior medial commisure, #30 Physio 1 ring. Done in [place]  . TONSILLECTOMY     about 1940  . TRICUSPID VALVE SURGERY  01/20/13   #28 TriAd ring done in [place]    Social History   Socioeconomic History  . Marital status: Widowed    Spouse name: Not on file  . Number of children: Not on file  . Years of education: Not on file  . Highest education level: Not on file  Occupational History  . Occupation: Automotive engineer    Comment: retired   Tobacco Use  . Smoking status: Never Smoker  . Smokeless tobacco: Never Used  Vaping Use  . Vaping Use: Never used  Substance and Sexual Activity  . Alcohol use: Yes    Alcohol/week: 1.0 standard drink    Types: 1 Glasses of wine per week  . Drug use: No  . Sexual activity: Not Currently  Other Topics Concern  . Not on file  Social History Narrative   Social History      Diet? Healthy- low salt, sugar, fat      Do you drink/eat things with caffeine? On occasion      Marital status?        widow                        What year were you married? 1958      Do you live in a house, apartment, assisted living, condo, trailer, etc.? apartment      Is it one or more stories? one      How many persons live in your home? one      Do you have any pets in your home? (please list) no      Highest level of education completed? 4 year college      Current or past profession: Statistician      Do  you exercise?             yes                         Type & how often? Classes- senior retirement community/ some walking      Advanced Directives      Do you have a living will?  Do you have a DNR form?                                  If not, do you want to discuss one?      Do you have signed POA/HPOA for forms?       Functional Status Completed by: Daughter, Di Kindle      Do you have difficulty bathing or dressing yourself? no      Do you have difficulty preparing food or eating? no      Do you have difficulty managing your medications? no      Do you have difficulty managing your finances? no      Do you have difficulty affording your medications? no   Social Determinants of Health   Financial Resource Strain:   . Difficulty of Paying Living Expenses:   Food Insecurity:   . Worried About Charity fundraiser in the Last Year:   . Arboriculturist in the Last Year:   Transportation Needs:   . Film/video editor (Medical):   Marland Kitchen Lack of Transportation (Non-Medical):   Physical Activity:   . Days of Exercise per Week:   . Minutes of Exercise per Session:   Stress:   . Feeling of Stress :   Social Connections:   . Frequency of Communication with Friends and Family:   . Frequency of Social Gatherings with Friends and Family:   . Attends Religious Services:   . Active Member of Clubs or Organizations:   . Attends Archivist Meetings:   Marland Kitchen Marital Status:     reports that she has never smoked. She has never used smokeless tobacco. She reports current alcohol use of about 1.0 standard drink of alcohol per week. She reports that she does not use drugs.  Functional Status Survey:    Family History  Problem Relation Age of Onset  . Lung disease Mother 4  . Alcohol abuse Son   . Heart disease Father 71    Health Maintenance  Topic Date Due  . INFLUENZA VACCINE  10/25/2019  . TETANUS/TDAP  04/16/2027  . DEXA SCAN  Completed  . COVID-19  Vaccine  Completed  . PNA vac Low Risk Adult  Completed    Allergies  Allergen Reactions  . Codeine Other (See Comments)    "just don't take it well"  . Molds & Smuts   . Pollen Extract Swelling  . Bee Venom Swelling and Rash    Outpatient Encounter Medications as of 10/06/2019  Medication Sig  . acetaminophen (TYLENOL) 500 MG tablet Take 1,000 mg by mouth in the morning and at bedtime.   Marland Kitchen aspirin EC 81 MG tablet Take 81 mg by mouth daily.  Marland Kitchen atorvastatin (LIPITOR) 20 MG tablet TAKE 1 TABLET DAILY  . Azelastine-Fluticasone 137-50 MCG/ACT SUSP Place 1 spray into the nose daily as needed (allergies).  . Biotin 5000 MCG CAPS Take 1 capsule by mouth daily.  . digoxin (LANOXIN) 0.125 MG tablet TAKE 1 TABLET DAILY  . ELIQUIS 2.5 MG TABS tablet TAKE 1 TABLET TWICE A DAY  . ENBREL SURECLICK 50 MG/ML injection   . EPINEPHrine 0.3 mg/0.3 mL IJ SOAJ injection Inject 0.3 mLs (0.3 mg total) into the muscle as needed for anaphylaxis.  Marland Kitchen ipratropium (ATROVENT) 0.03 % nasal spray Place 1 spray into both nostrils 2 (two) times daily.  . magnesium oxide (MAG-OX) 400  MG tablet Take 400 mg by mouth daily.  . Melatonin 5 MG TABS Take 1 tablet by mouth at bedtime as needed.  . metoprolol succinate (TOPROL XL) 50 MG 24 hr tablet Take 1 tablet (50 mg total) by mouth daily. Take with or immediately following a meal.  . mirtazapine (REMERON) 15 MG tablet Take 1 tablet (15 mg total) by mouth at bedtime.  . Multiple Vitamins-Minerals (PRESERVISION AREDS 2) CAPS Take 1 Dose by mouth 2 (two) times daily. For eye health  . omeprazole (PRILOSEC) 40 MG capsule Take 1 capsule (40 mg total) by mouth daily.  . polycarbophil (FIBERCON) 625 MG tablet Take 1 tablet (625 mg total) by mouth daily.  . Probiotic Product (ALIGN) 4 MG CAPS Take 1 capsule by mouth daily.  Marland Kitchen SYNTHROID 75 MCG tablet TAKE 1 TABLET DAILY BEFORE BREAKFAST FOR HYPOTHYROIDISM  . valsartan (DIOVAN) 80 MG tablet Take 1 tablet (80 mg total) by mouth  daily.  . vitamin B-12 (CYANOCOBALAMIN) 1000 MCG tablet Take 1,000 mcg by mouth daily.   . [DISCONTINUED] cholecalciferol (VITAMIN D) 1000 units tablet Take 1,000 Units by mouth daily.   No facility-administered encounter medications on file as of 10/06/2019.    Review of Systems  Constitutional: Negative for chills, fever and malaise/fatigue.  HENT: Negative for congestion and sore throat.   Eyes: Negative for blurred vision.  Respiratory: Negative for cough and shortness of breath.   Cardiovascular: Negative for chest pain, palpitations and leg swelling.  Gastrointestinal: Positive for constipation. Negative for abdominal pain, blood in stool, diarrhea and melena.  Genitourinary: Positive for frequency and urgency. Negative for dysuria, flank pain and hematuria.  Musculoskeletal: Positive for joint pain. Negative for falls.  Skin: Negative for itching and rash.  Neurological: Negative for dizziness, sensory change, speech change, focal weakness and loss of consciousness.  Psychiatric/Behavioral: Positive for memory loss. Negative for depression. The patient is not nervous/anxious and does not have insomnia.     Vitals:   10/06/19 1037  BP: 134/85  Pulse: 70  Temp: 98.2 F (36.8 C)  SpO2: 95%  Weight: 126 lb (57.2 kg)  Height: 5\' 2"  (1.575 m)   Body mass index is 23.05 kg/m. Physical Exam Vitals reviewed.  Constitutional:      General: She is not in acute distress.    Appearance: Normal appearance. She is not ill-appearing or toxic-appearing.  HENT:     Head: Normocephalic and atraumatic.     Right Ear: External ear normal.     Left Ear: External ear normal.     Nose: Nose normal.     Mouth/Throat:     Pharynx: Oropharynx is clear.  Eyes:     Extraocular Movements: Extraocular movements intact.     Conjunctiva/sclera: Conjunctivae normal.     Pupils: Pupils are equal, round, and reactive to light.  Cardiovascular:     Rate and Rhythm: Rhythm irregular.     Heart  sounds: No murmur heard.   Pulmonary:     Effort: Pulmonary effort is normal.     Breath sounds: Normal breath sounds. No wheezing, rhonchi or rales.  Abdominal:     General: Bowel sounds are normal.     Palpations: Abdomen is soft.     Tenderness: There is no abdominal tenderness. There is no guarding or rebound.  Musculoskeletal:        General: Normal range of motion.     Cervical back: Neck supple.     Right lower leg: No edema.  Left lower leg: No edema.     Comments: Using walker  Lymphadenopathy:     Cervical: No cervical adenopathy.  Skin:    General: Skin is warm and dry.     Capillary Refill: Capillary refill takes less than 2 seconds.  Neurological:     General: No focal deficit present.     Mental Status: She is alert and oriented to person, place, and time.     Cranial Nerves: No cranial nerve deficit.     Motor: No weakness.     Gait: Gait abnormal.     Comments: Using walker with therapy, bathed herself this am  Psychiatric:        Mood and Affect: Mood normal.        Behavior: Behavior normal.     Labs reviewed: Basic Metabolic Panel: Recent Labs    02/03/19 0000 04/20/19 1156 07/14/19 0000 07/14/19 0600  NA 138 137 139  --   K 5.1 4.5 4.5  --   CL 100 100 104  --   CO2 23* 26  --   --   GLUCOSE  --  110*  --   --   BUN 20 15 18   --   CREATININE 0.8 0.93 0.7  --   CALCIUM 9.3 9.0  --  9.1   Liver Function Tests: Recent Labs    02/03/19 0000 04/20/19 1156  AST 23 27  ALT 19 23  ALKPHOS 86 71  BILITOT  --  1.0  PROT  --  6.9  ALBUMIN 4.6 4.3   No results for input(s): LIPASE, AMYLASE in the last 8760 hours. No results for input(s): AMMONIA in the last 8760 hours. CBC: Recent Labs    02/03/19 0000 04/20/19 1156 07/14/19 0600  WBC 5.6 5.9 5.3  NEUTROABS  --  2.9  --   HGB 11.7* 12.3 10.8*  HCT 35* 36.8 32*  MCV  --  104.8*  --   PLT  --  163 158   Cardiac Enzymes: No results for input(s): CKTOTAL, CKMB, CKMBINDEX, TROPONINI  in the last 8760 hours. BNP: Invalid input(s): POCBNP Lab Results  Component Value Date   HGBA1C 5.5 01/12/2013   Lab Results  Component Value Date   TSH 2.309 04/20/2019   Lab Results  Component Value Date   DQQIWLNL89 2,119 02/03/2019   No results found for: FOLATE No results found for: IRON, TIBC, FERRITIN  Imaging and Procedures obtained prior to SNF admission: LE VENOUS  Result Date: 07/07/2019  Lower Venous DVTStudy Indications: Pain.  Limitations: Clothing restriction, patient on EMT stretcher. Comparison Study: No prior study Performing Technologist: Maudry Mayhew MHA, RDMS, RVT, RDCS  Examination Guidelines: A complete evaluation includes B-mode imaging, spectral Doppler, color Doppler, and power Doppler as needed of all accessible portions of each vessel. Bilateral testing is considered an integral part of a complete examination. Limited examinations for reoccurring indications may be performed as noted. The reflux portion of the exam is performed with the patient in reverse Trendelenburg.  +---------+---------------+---------+-----------+----------+--------------+ LEFT     CompressibilityPhasicitySpontaneityPropertiesThrombus Aging +---------+---------------+---------+-----------+----------+--------------+ CFV      Full           Yes      Yes                                 +---------+---------------+---------+-----------+----------+--------------+ SFJ      Full                                                        +---------+---------------+---------+-----------+----------+--------------+  FV Prox  Full                                                        +---------+---------------+---------+-----------+----------+--------------+ FV Mid   Full                                                        +---------+---------------+---------+-----------+----------+--------------+ FV DistalFull                                                         +---------+---------------+---------+-----------+----------+--------------+ PFV      Full                                                        +---------+---------------+---------+-----------+----------+--------------+ POP      Full           Yes      Yes                                 +---------+---------------+---------+-----------+----------+--------------+ PTV      Full                                                        +---------+---------------+---------+-----------+----------+--------------+ PERO     Full                                                        +---------+---------------+---------+-----------+----------+--------------+     Summary: LEFT: - There is no evidence of deep vein thrombosis in the lower extremity.  - No cystic structure found in the popliteal fossa.  *See table(s) above for measurements and observations. Electronically signed by Deitra Mayo MD on 07/07/2019 at 3:25:01 PM.    Final     Assessment/Plan 1. Stroke w/o residual effects -did have area of infarct on her left corona radiata and has several old punctate hemorrhages from longstanding htn (bp has been well controlled the past few years though) on MRI -here for PT, OT, ST -was able to bathe herself this am -my recommendation is for a move to AL soon before her needs exceed her current IL setting -off asa due to possible hemorrhagic component, but back on her eliquis now -suspicion is for cardioembolic source  2. Mild cognitive impairment with memory loss -as far as we know, she's taking all meds as directed -continue supportive care in rehab  3. Psychophysiological insomnia -sleeping better with remeron, appetite improved also  reportedly  4. Overactive bladder -not on medication at this time  5. Chronic atrial fibrillation (HCC) -stable on her toprol for rate control and back on eliquis anticoagulation since no acute hemorrhages seen  6. Primary osteoarthritis  of both knees -cont PT exercises and avoid overly aggressive activity for her advanced age  59. HTN (hypertension), benign -bp well controlled, cont diovan, toprol and monitor  8 Rheumatoid arthritis involving hand with negative rheumatoid factor, unspecified laterality ([place]) -continues arava/enbrel thru rheum  10. Chronic constipation -doing better with fiber supplement, encourage hydration, mag-ox  11. DNR (do not resuscitate) -DNR order in place per resident request  Family/ staff Communication: had mychart communication with her daughter, also spoke with therapy and rehab nursing  Records from [facility] reviewed and are accessible via careeverywhere now.  Labs/tests ordered:  No new added today  Litisha Guagliardo L. Troy Kanouse, D.O. [facility] [address], [place] [address] Cell Phone (Mon-Fri 8am-5pm):  [phone] On Call:  [phone] & follow prompts after 5pm & weekends Office Phone:  [phone] Office Fax:  [phone]

## 2019-10-07 DIAGNOSIS — G458 Other transient cerebral ischemic attacks and related syndromes: Secondary | ICD-10-CM | POA: Diagnosis not present

## 2019-10-07 DIAGNOSIS — M6389 Disorders of muscle in diseases classified elsewhere, multiple sites: Secondary | ICD-10-CM | POA: Diagnosis not present

## 2019-10-07 DIAGNOSIS — R2689 Other abnormalities of gait and mobility: Secondary | ICD-10-CM | POA: Diagnosis not present

## 2019-10-07 DIAGNOSIS — R278 Other lack of coordination: Secondary | ICD-10-CM | POA: Diagnosis not present

## 2019-10-07 DIAGNOSIS — Z8673 Personal history of transient ischemic attack (TIA), and cerebral infarction without residual deficits: Secondary | ICD-10-CM | POA: Diagnosis not present

## 2019-10-07 DIAGNOSIS — R41841 Cognitive communication deficit: Secondary | ICD-10-CM | POA: Diagnosis not present

## 2019-10-07 NOTE — Progress Notes (Signed)
NEUROLOGY FOLLOW UP OFFICE NOTE  Kathy Velez [id number]  HISTORY OF PRESENT ILLNESS: Kathy Velez is an 86 year old right-handed white female with paroxysmal atrial fibrillation on Eliquis, HTN, hypothyroidism, interstitial lung disease, rheumatoid arthritis and history of stroke and colon cancer who follows up for stroke.  She is accompanied by her daughter who supplements history.  Hospital records reviewed.  UPDATE: Current medications:  ASA 81mg ; Eliquis; digoxin; atorvastatin 20mg ; Toprol XL; valsartan  Right facial difficulty with language, confusion, balance  She had another stroke while in the [facility], in which she had confusion, language difficulty and transient right facial droop.  She was transferred to [facility] on 09/27/2019.  MRI of brain showed an acute infarct in the left frontal corona radiata.  Again demonstrated multiple areas of remote microhemorrhages in the peripheral cerebral hemispheres as well as pons, suggesting hypertensive hemorrhage with underlying cerebral amyloid angiopathy.  CTA head and neck showed no large vessel intracranial occlusion or significant stenosis but did demonstrate atherosclerotic plaque in both ICAs causing less than 50% stenosis on the right and 50% stenosis on the left.  Echocardiogram was negative for thrombus.  LDL 46.  HGB A1c 5.1.  Telemetry demonstrated atrial fibrillation.  She is now at [facility].  She has ongoing short-term memory problems.  HISTORY: On 04/20/2019, she presented to the [facility] ED after having word-finding difficulties and making paraphasic errors lasting a couple of hours.  She was slurring her words and wasn't able to answer basic questions.  No associated facial droop, numbness or weakness.  She has some baseline diplopia but without change.  She had some cough but no fever or shortness of breath.  Labs were unremarkable, including CBC, CMP, TSH (2.309) and testing for flu and Covid.   CXR showed no active pulmonary disease.  EKG showed known atrial fibrillation but no acute ischemic cardiac event.  CT head without contrast showed chronic small vessel disease but no acute findings.  MRI of brain without contrast showed 2 punctate subacute infarcts in the left corona radiata and occipital pole subcortical white matter. Otherwise chronic small vessel ischemic changes with few scattered chronic microhemorrhages in the left occipital pole and central pons and small remote lacunar infarcts in right cerebellum.  No change was made to antiplatelet and anticoagulation therapy.  She is on ASA and Eliquis.    Several months prior, she had a severe left sided headache and noted vision loss on the left side of her vision while watching TV.  It lasted 2 hours. She reports prior history of TIA with right sided weakness.  Recent previous studies: 11/27/2018 ECHOCARDIOGRAM:  EF 60-65% with no cardiac source of emboli 02/03/2019 LABS:  LDL 58; B12 1,559 03/06/2019 CAROTID DOPPLER:  Moderate heterogeneous and calcified plaque with no hemodynamically significant stenosis    PAST MEDICAL HISTORY: Past Medical History:  Diagnosis Date  . Anemia   . Anxiety   . Arthritis    Back  . Atrial fibrillation ([place])   . Cancer [facility]) 2006   Colon.  Basal Cell Skin cancer- right arm  . Colon cancer ([place])   . Constipation due to pain medication therapy    after heart surgery  . Dysrhythmia    PAF  . GERD (gastroesophageal reflux disease)   . Heart murmur   . Hypertension   . Hypothyroidism   . RA (rheumatoid arthritis) ([place])   . Restless leg   . Seizures ([place])  after Heart Surgey  . Stroke [facility])    TIA- found by neurologist after     MEDICATIONS: Current Outpatient Medications on File Prior to Visit  Medication Sig Dispense Refill  . acetaminophen (TYLENOL) 500 MG tablet Take 1,000 mg by mouth in the morning and at bedtime.     Marland Kitchen aspirin EC 81 MG tablet Take 81 mg by mouth daily.     Marland Kitchen atorvastatin (LIPITOR) 20 MG tablet TAKE 1 TABLET DAILY 90 tablet 3  . Azelastine-Fluticasone 137-50 MCG/ACT SUSP Place 1 spray into the nose daily as needed (allergies). 1 g 3  . Biotin 5000 MCG CAPS Take 1 capsule by mouth daily.    . digoxin (LANOXIN) 0.125 MG tablet TAKE 1 TABLET DAILY 90 tablet 3  . ELIQUIS 2.5 MG TABS tablet TAKE 1 TABLET TWICE A DAY 180 tablet 3  . ENBREL SURECLICK 50 MG/ML injection     . EPINEPHrine 0.3 mg/0.3 mL IJ SOAJ injection Inject 0.3 mLs (0.3 mg total) into the muscle as needed for anaphylaxis. 1 each 0  . ipratropium (ATROVENT) 0.03 % nasal spray Place 1 spray into both nostrils 2 (two) times daily. 30 mL 3  . magnesium oxide (MAG-OX) 400 MG tablet Take 400 mg by mouth daily.    . Melatonin 5 MG TABS Take 1 tablet by mouth at bedtime as needed.    . metoprolol succinate (TOPROL XL) 50 MG 24 hr tablet Take 1 tablet (50 mg total) by mouth daily. Take with or immediately following a meal. 30 tablet 0  . mirtazapine (REMERON) 15 MG tablet Take 1 tablet (15 mg total) by mouth at bedtime. 90 tablet 1  . Multiple Vitamins-Minerals (PRESERVISION AREDS 2) CAPS Take 1 Dose by mouth 2 (two) times daily. For eye health    . omeprazole (PRILOSEC) 40 MG capsule Take 1 capsule (40 mg total) by mouth daily. 90 capsule 3  . polycarbophil (FIBERCON) 625 MG tablet Take 1 tablet (625 mg total) by mouth daily. 30 tablet 3  . Probiotic Product (ALIGN) 4 MG CAPS Take 1 capsule by mouth daily.    Marland Kitchen SYNTHROID 75 MCG tablet TAKE 1 TABLET DAILY BEFORE BREAKFAST FOR HYPOTHYROIDISM 90 tablet 3  . valsartan (DIOVAN) 80 MG tablet Take 1 tablet (80 mg total) by mouth daily. 90 tablet 1  . vitamin B-12 (CYANOCOBALAMIN) 1000 MCG tablet Take 1,000 mcg by mouth daily.      No current facility-administered medications on file prior to visit.    ALLERGIES: Allergies  Allergen Reactions  . Codeine Other (See Comments)    "just don't take it well"  . Molds & Smuts   . Pollen Extract  Swelling  . Bee Venom Swelling and Rash    FAMILY HISTORY: Family History  Problem Relation Age of Onset  . Lung disease Mother 48  . Alcohol abuse Son   . Heart disease Father 73    SOCIAL HISTORY: Social History   Socioeconomic History  . Marital status: Widowed    Spouse name: Not on file  . Number of children: Not on file  . Years of education: Not on file  . Highest education level: Not on file  Occupational History  . Occupation: Automotive engineer    Comment: retired   Tobacco Use  . Smoking status: Never Smoker  . Smokeless tobacco: Never Used  Vaping Use  . Vaping Use: Never used  Substance and Sexual Activity  . Alcohol use: Yes    Alcohol/week: 1.0  standard drink    Types: 1 Glasses of wine per week  . Drug use: No  . Sexual activity: Not Currently  Other Topics Concern  . Not on file  Social History Narrative   Social History      Diet? Healthy- low salt, sugar, fat      Do you drink/eat things with caffeine? On occasion      Marital status?        widow                        What year were you married? 1958      Do you live in a house, apartment, assisted living, condo, trailer, etc.? apartment      Is it one or more stories? one      How many persons live in your home? one      Do you have any pets in your home? (please list) no      Highest level of education completed? 4 year college      Current or past profession: Statistician      Do you exercise?             yes                         Type & how often? Classes- senior retirement community/ some walking      Advanced Directives      Do you have a living will?      Do you have a DNR form?                                  If not, do you want to discuss one?      Do you have signed POA/HPOA for forms?       Functional Status Completed by: Daughter, Di Kindle      Do you have difficulty bathing or dressing yourself? no      Do you have difficulty preparing  food or eating? no      Do you have difficulty managing your medications? no      Do you have difficulty managing your finances? no      Do you have difficulty affording your medications? no   Social Determinants of Health   Financial Resource Strain:   . Difficulty of Paying Living Expenses:   Food Insecurity:   . Worried About Charity fundraiser in the Last Year:   . Arboriculturist in the Last Year:   Transportation Needs:   . Film/video editor (Medical):   Marland Kitchen Lack of Transportation (Non-Medical):   Physical Activity:   . Days of Exercise per Week:   . Minutes of Exercise per Session:   Stress:   . Feeling of Stress :   Social Connections:   . Frequency of Communication with Friends and Family:   . Frequency of Social Gatherings with Friends and Family:   . Attends Religious Services:   . Active Member of Clubs or Organizations:   . Attends Archivist Meetings:   Marland Kitchen Marital Status:   Intimate Partner Violence:   . Fear of Current or Ex-Partner:   . Emotionally Abused:   Marland Kitchen Physically Abused:   . Sexually Abused:    PHYSICAL EXAM: Blood pressure 122/76, pulse (!) 106, resp. rate 20, height  5\' 2"  (1.575 m), weight 124 lb (56.2 kg), SpO2 97 %. General: No acute distress.  Patient appears well-groomed.   Head:  Normocephalic/atraumatic Eyes:  Fundi examined but not visualized Neck: supple, no paraspinal tenderness, full range of motion Heart:  Regular rate and rhythm Lungs:  Clear to auscultation bilaterally Back: No paraspinal tenderness Neurological Exam: alert and oriented to person, place, and time. Attention span and concentration fair, recall 2 of 3 words, remote memory intact, fund of knowledge intact.  Speech fluent and not dysarthric, language intact.  CN II-XII intact. Bulk and tone normal, muscle strength 5/5 throughout.  Deep tendon reflexes 2+ throughout, Finger to nose  testing intact.  Gait wide-based and cautious.  IMPRESSION: 1.  Left  frontal lobe infarct, likely cardioembolic 2.  Atrial fibrillation 3.  Possible cerebral amyloid angiopathy 4.  Hypertension 5.  Hyperlipidemia  PLAN: 1.  She will continue Eliquis as risk of ischemic stroke is high.  But due to possible CCA, she will discontinue ASA. 2.  Continue statin therapy 3.  Continue blood pressure control 4.  Currently at [facility].  She will be evaluated for safety assessment prior to discharge. 5.  Follow up in 4 to 6 months.  Metta Clines, DO  CC:  Hollace Kinnier, DO

## 2019-10-08 ENCOUNTER — Other Ambulatory Visit: Payer: Self-pay

## 2019-10-08 ENCOUNTER — Ambulatory Visit (INDEPENDENT_AMBULATORY_CARE_PROVIDER_SITE_OTHER): Payer: Medicare Other | Admitting: Neurology

## 2019-10-08 ENCOUNTER — Encounter: Payer: Self-pay | Admitting: Neurology

## 2019-10-08 VITALS — BP 122/76 | HR 106 | Resp 20 | Ht 62.0 in | Wt 124.0 lb

## 2019-10-08 DIAGNOSIS — M6389 Disorders of muscle in diseases classified elsewhere, multiple sites: Secondary | ICD-10-CM | POA: Diagnosis not present

## 2019-10-08 DIAGNOSIS — R278 Other lack of coordination: Secondary | ICD-10-CM | POA: Diagnosis not present

## 2019-10-08 DIAGNOSIS — I10 Essential (primary) hypertension: Secondary | ICD-10-CM

## 2019-10-08 DIAGNOSIS — R2689 Other abnormalities of gait and mobility: Secondary | ICD-10-CM | POA: Diagnosis not present

## 2019-10-08 DIAGNOSIS — I63412 Cerebral infarction due to embolism of left middle cerebral artery: Secondary | ICD-10-CM | POA: Diagnosis not present

## 2019-10-08 DIAGNOSIS — I4821 Permanent atrial fibrillation: Secondary | ICD-10-CM | POA: Diagnosis not present

## 2019-10-08 DIAGNOSIS — R41841 Cognitive communication deficit: Secondary | ICD-10-CM | POA: Diagnosis not present

## 2019-10-08 DIAGNOSIS — Z8673 Personal history of transient ischemic attack (TIA), and cerebral infarction without residual deficits: Secondary | ICD-10-CM | POA: Diagnosis not present

## 2019-10-08 DIAGNOSIS — E785 Hyperlipidemia, unspecified: Secondary | ICD-10-CM | POA: Diagnosis not present

## 2019-10-08 DIAGNOSIS — G458 Other transient cerebral ischemic attacks and related syndromes: Secondary | ICD-10-CM | POA: Diagnosis not present

## 2019-10-08 DIAGNOSIS — I680 Cerebral amyloid angiopathy: Secondary | ICD-10-CM | POA: Diagnosis not present

## 2019-10-08 NOTE — Patient Instructions (Signed)
1.  Continue Eliquis.  STOP ASPIRIN 2.  Continue other medications 3.  Follow up in 4 to 6 months

## 2019-10-09 DIAGNOSIS — G458 Other transient cerebral ischemic attacks and related syndromes: Secondary | ICD-10-CM | POA: Diagnosis not present

## 2019-10-09 DIAGNOSIS — R278 Other lack of coordination: Secondary | ICD-10-CM | POA: Diagnosis not present

## 2019-10-09 DIAGNOSIS — R41841 Cognitive communication deficit: Secondary | ICD-10-CM | POA: Diagnosis not present

## 2019-10-09 DIAGNOSIS — Z8673 Personal history of transient ischemic attack (TIA), and cerebral infarction without residual deficits: Secondary | ICD-10-CM | POA: Diagnosis not present

## 2019-10-09 DIAGNOSIS — M6389 Disorders of muscle in diseases classified elsewhere, multiple sites: Secondary | ICD-10-CM | POA: Diagnosis not present

## 2019-10-09 DIAGNOSIS — R2689 Other abnormalities of gait and mobility: Secondary | ICD-10-CM | POA: Diagnosis not present

## 2019-10-11 DIAGNOSIS — F5104 Psychophysiologic insomnia: Secondary | ICD-10-CM | POA: Insufficient documentation

## 2019-10-11 DIAGNOSIS — Z66 Do not resuscitate: Secondary | ICD-10-CM | POA: Insufficient documentation

## 2019-10-12 DIAGNOSIS — G458 Other transient cerebral ischemic attacks and related syndromes: Secondary | ICD-10-CM | POA: Diagnosis not present

## 2019-10-12 DIAGNOSIS — R41841 Cognitive communication deficit: Secondary | ICD-10-CM | POA: Diagnosis not present

## 2019-10-12 DIAGNOSIS — R2689 Other abnormalities of gait and mobility: Secondary | ICD-10-CM | POA: Diagnosis not present

## 2019-10-12 DIAGNOSIS — Z8673 Personal history of transient ischemic attack (TIA), and cerebral infarction without residual deficits: Secondary | ICD-10-CM | POA: Diagnosis not present

## 2019-10-12 DIAGNOSIS — M6389 Disorders of muscle in diseases classified elsewhere, multiple sites: Secondary | ICD-10-CM | POA: Diagnosis not present

## 2019-10-12 DIAGNOSIS — R278 Other lack of coordination: Secondary | ICD-10-CM | POA: Diagnosis not present

## 2019-10-13 DIAGNOSIS — M6389 Disorders of muscle in diseases classified elsewhere, multiple sites: Secondary | ICD-10-CM | POA: Diagnosis not present

## 2019-10-13 DIAGNOSIS — R278 Other lack of coordination: Secondary | ICD-10-CM | POA: Diagnosis not present

## 2019-10-13 DIAGNOSIS — R41841 Cognitive communication deficit: Secondary | ICD-10-CM | POA: Diagnosis not present

## 2019-10-13 DIAGNOSIS — G458 Other transient cerebral ischemic attacks and related syndromes: Secondary | ICD-10-CM | POA: Diagnosis not present

## 2019-10-13 DIAGNOSIS — Z8673 Personal history of transient ischemic attack (TIA), and cerebral infarction without residual deficits: Secondary | ICD-10-CM | POA: Diagnosis not present

## 2019-10-13 DIAGNOSIS — R2689 Other abnormalities of gait and mobility: Secondary | ICD-10-CM | POA: Diagnosis not present

## 2019-10-14 DIAGNOSIS — R278 Other lack of coordination: Secondary | ICD-10-CM | POA: Diagnosis not present

## 2019-10-14 DIAGNOSIS — R41841 Cognitive communication deficit: Secondary | ICD-10-CM | POA: Diagnosis not present

## 2019-10-14 DIAGNOSIS — G458 Other transient cerebral ischemic attacks and related syndromes: Secondary | ICD-10-CM | POA: Diagnosis not present

## 2019-10-14 DIAGNOSIS — M6389 Disorders of muscle in diseases classified elsewhere, multiple sites: Secondary | ICD-10-CM | POA: Diagnosis not present

## 2019-10-14 DIAGNOSIS — R2689 Other abnormalities of gait and mobility: Secondary | ICD-10-CM | POA: Diagnosis not present

## 2019-10-14 DIAGNOSIS — Z8673 Personal history of transient ischemic attack (TIA), and cerebral infarction without residual deficits: Secondary | ICD-10-CM | POA: Diagnosis not present

## 2019-10-15 DIAGNOSIS — R2689 Other abnormalities of gait and mobility: Secondary | ICD-10-CM | POA: Diagnosis not present

## 2019-10-15 DIAGNOSIS — Z8673 Personal history of transient ischemic attack (TIA), and cerebral infarction without residual deficits: Secondary | ICD-10-CM | POA: Diagnosis not present

## 2019-10-15 DIAGNOSIS — G458 Other transient cerebral ischemic attacks and related syndromes: Secondary | ICD-10-CM | POA: Diagnosis not present

## 2019-10-15 DIAGNOSIS — M6389 Disorders of muscle in diseases classified elsewhere, multiple sites: Secondary | ICD-10-CM | POA: Diagnosis not present

## 2019-10-15 DIAGNOSIS — R41841 Cognitive communication deficit: Secondary | ICD-10-CM | POA: Diagnosis not present

## 2019-10-15 DIAGNOSIS — R278 Other lack of coordination: Secondary | ICD-10-CM | POA: Diagnosis not present

## 2019-10-16 DIAGNOSIS — G458 Other transient cerebral ischemic attacks and related syndromes: Secondary | ICD-10-CM | POA: Diagnosis not present

## 2019-10-16 DIAGNOSIS — Z8673 Personal history of transient ischemic attack (TIA), and cerebral infarction without residual deficits: Secondary | ICD-10-CM | POA: Diagnosis not present

## 2019-10-16 DIAGNOSIS — R41841 Cognitive communication deficit: Secondary | ICD-10-CM | POA: Diagnosis not present

## 2019-10-16 DIAGNOSIS — R278 Other lack of coordination: Secondary | ICD-10-CM | POA: Diagnosis not present

## 2019-10-16 DIAGNOSIS — R2689 Other abnormalities of gait and mobility: Secondary | ICD-10-CM | POA: Diagnosis not present

## 2019-10-16 DIAGNOSIS — M6389 Disorders of muscle in diseases classified elsewhere, multiple sites: Secondary | ICD-10-CM | POA: Diagnosis not present

## 2019-10-19 ENCOUNTER — Non-Acute Institutional Stay (SKILLED_NURSING_FACILITY): Payer: Medicare Other | Admitting: Adult Health

## 2019-10-19 DIAGNOSIS — I480 Paroxysmal atrial fibrillation: Secondary | ICD-10-CM | POA: Diagnosis not present

## 2019-10-19 DIAGNOSIS — I10 Essential (primary) hypertension: Secondary | ICD-10-CM | POA: Diagnosis not present

## 2019-10-19 DIAGNOSIS — R2689 Other abnormalities of gait and mobility: Secondary | ICD-10-CM | POA: Diagnosis not present

## 2019-10-19 DIAGNOSIS — M06049 Rheumatoid arthritis without rheumatoid factor, unspecified hand: Secondary | ICD-10-CM

## 2019-10-19 DIAGNOSIS — M6389 Disorders of muscle in diseases classified elsewhere, multiple sites: Secondary | ICD-10-CM | POA: Diagnosis not present

## 2019-10-19 DIAGNOSIS — R41841 Cognitive communication deficit: Secondary | ICD-10-CM | POA: Diagnosis not present

## 2019-10-19 DIAGNOSIS — Z8673 Personal history of transient ischemic attack (TIA), and cerebral infarction without residual deficits: Secondary | ICD-10-CM

## 2019-10-19 DIAGNOSIS — M170 Bilateral primary osteoarthritis of knee: Secondary | ICD-10-CM | POA: Diagnosis not present

## 2019-10-19 DIAGNOSIS — G3184 Mild cognitive impairment, so stated: Secondary | ICD-10-CM

## 2019-10-19 DIAGNOSIS — R278 Other lack of coordination: Secondary | ICD-10-CM | POA: Diagnosis not present

## 2019-10-19 DIAGNOSIS — G458 Other transient cerebral ischemic attacks and related syndromes: Secondary | ICD-10-CM | POA: Diagnosis not present

## 2019-10-19 NOTE — Progress Notes (Addendum)
Location:  Occupational psychologist of Service:  SNF (31)  Provider:  Cindi Carbon, ANP [place] [phone]   PCP: Gayland Curry, DO Patient Care Team: Gayland Curry, DO as PCP - General (Geriatric Medicine) Pixie Casino, MD as PCP - Cardiology (Cardiology) Pieter Partridge, DO as Consulting Physician (Neurology)  Extended Emergency Contact Information Primary Emergency Contact: [name],[name] Address: [place], [place] [address] [address] of [place] Phone: [phone] Relation: Daughter  Code Status: DNR Goals of care:  Advanced Directive information Advanced Directives 10/08/2019  Does Patient Have a Medical Advance Directive? Yes  Type of Advance Directive -  Does patient want to make changes to medical advance directive? -  Copy of [place] in Chart? -  Would patient like information on creating a medical advance directive? -  Pre-existing out of facility DNR order (yellow form or pink MOST form) -     Allergies  Allergen Reactions  . Codeine Other (See Comments)    "just don't take it well"  . Molds & Smuts   . Pollen Extract Swelling  . Bee Venom Swelling and Rash    Chief Complaint  Patient presents with  . Acute Visit    discharge note    HPI:  86 y.o. female seen for discharge from [facility] [place]. She was admitted after a hospitalization while on a trip in [place]. She present to the ER with right sided weakness and dysarthria on 7/5 and was discharged home.  She returned when the symptoms came back on 7/6.  Neurology had seen her and determined that the right sided sensory deficits and moderate to severe aphasia were likely due to lacunar stroke versus TIA.  She has worked with therapy and is back to her previous level of function. The staff note that she is ambulating with a walker, eating and drinking well, and able to perform her ADLs.  She has  some mild memory loss. Therapy has worked with her and feel that she is best suited for AL due to her mild memory loss and need for medication management. Ms. Goucher would prefer to go home  with med management.  She was seen by neurology on 7/15 and taken off aspirin due to a hemorrhagic component of her CVA but continues on eliquis for possible cardio embolic source. Of note there is concern for cerebral amyloid angiopathy.      Past Medical History:  Diagnosis Date  . Anemia   . Anxiety   . Arthritis    Back  . Atrial fibrillation ([place])   . Cancer [facility]) 2006   Colon.  Basal Cell Skin cancer- right arm  . Colon cancer ([place])   . Constipation due to pain medication therapy    after heart surgery  . Dysrhythmia    PAF  . GERD (gastroesophageal reflux disease)   . Heart murmur   . Hypertension   . Hypothyroidism   . RA (rheumatoid arthritis) ([place])   . Restless leg   . Seizures ([place])    after Heart Surgey  . Stroke [facility])    TIA- found by neurologist after     Past Surgical History:  Procedure Laterality Date  . ABDOMINAL HYSTERECTOMY  1970   Partial   . COLON RESECTION  2006   cancer  . COLONOSCOPY    . EYE SURGERY Bilateral  Cataract  . MAXIMUM ACCESS (MAS)POSTERIOR LUMBAR INTERBODY FUSION (PLIF) 2 LEVEL N/A 04/12/2015   Procedure: Lumbar Three-Five Decompression, Pedicle Screw Fixation, and Posteriolateral Arthrodesis;  Surgeon: Erline Levine, MD;  Location: [place] NEURO ORS;  Service: Neurosurgery;  Laterality: N/A;  L3-4 L4-5 Maximum access posterior lumbar fusion, possible interbodies and resection of synovial cyst at L4-5  . MITRAL VALVE REPAIR  01/20/13   Gore-tex cords to P1, P2, and P3. Magic suture to posterior medial commisure, #30 Physio 1 ring. Done in [place]  . TONSILLECTOMY     about 1940  . TRICUSPID VALVE SURGERY  01/20/13   #28 TriAd ring done in [place]      reports that she has never smoked. She has never used smokeless tobacco. She reports current  alcohol use of about 1.0 standard drink of alcohol per week. She reports that she does not use drugs. Social History   Socioeconomic History  . Marital status: Widowed    Spouse name: Not on file  . Number of children: Not on file  . Years of education: Not on file  . Highest education level: Not on file  Occupational History  . Occupation: Automotive engineer    Comment: retired   Tobacco Use  . Smoking status: Never Smoker  . Smokeless tobacco: Never Used  Vaping Use  . Vaping Use: Never used  Substance and Sexual Activity  . Alcohol use: Yes    Alcohol/week: 1.0 standard drink    Types: 1 Glasses of wine per week  . Drug use: No  . Sexual activity: Not Currently  Other Topics Concern  . Not on file  Social History Narrative   Social History      Diet? Healthy- low salt, sugar, fat      Do you drink/eat things with caffeine? On occasion      Marital status?        widow                        What year were you married? 1958      Do you live in a house, apartment, assisted living, condo, trailer, etc.? apartment      Is it one or more stories? one      How many persons live in your home? one      Do you have any pets in your home? (please list) no      Highest level of education completed? 4 year college      Current or past profession: Statistician      Do you exercise?             yes                         Type & how often? Classes- senior retirement community/ some walking      Advanced Directives      Do you have a living will?      Do you have a DNR form?                                  If not, do you want to discuss one?      Do you have signed POA/HPOA for forms?       Functional Status Completed by: Daughter, Di Kindle      Do you have difficulty bathing  or dressing yourself? no      Do you have difficulty preparing food or eating? no      Do you have difficulty managing your medications? no      Do you have difficulty  managing your finances? no      Do you have difficulty affording your medications? no   Right handed   Social Determinants of Health   Financial Resource Strain:   . Difficulty of Paying Living Expenses:   Food Insecurity:   . Worried About Charity fundraiser in the Last Year:   . Arboriculturist in the Last Year:   Transportation Needs:   . Film/video editor (Medical):   Marland Kitchen Lack of Transportation (Non-Medical):   Physical Activity:   . Days of Exercise per Week:   . Minutes of Exercise per Session:   Stress:   . Feeling of Stress :   Social Connections:   . Frequency of Communication with Friends and Family:   . Frequency of Social Gatherings with Friends and Family:   . Attends Religious Services:   . Active Member of Clubs or Organizations:   . Attends Archivist Meetings:   Marland Kitchen Marital Status:   Intimate Partner Violence:   . Fear of Current or Ex-Partner:   . Emotionally Abused:   Marland Kitchen Physically Abused:   . Sexually Abused:    Functional Status Survey:    Allergies  Allergen Reactions  . Codeine Other (See Comments)    "just don't take it well"  . Molds & Smuts   . Pollen Extract Swelling  . Bee Venom Swelling and Rash    Pertinent  Health Maintenance Due  Topic Date Due  . INFLUENZA VACCINE  10/25/2019  . DEXA SCAN  Completed  . PNA vac Low Risk Adult  Completed    Medications: Outpatient Encounter Medications as of 10/19/2019  Medication Sig  . atorvastatin (LIPITOR) 20 MG tablet TAKE 1 TABLET DAILY  . digoxin (LANOXIN) 0.125 MG tablet TAKE 1 TABLET DAILY  . ELIQUIS 2.5 MG TABS tablet TAKE 1 TABLET TWICE A DAY (Patient taking differently: 5 mg 2 (two) times daily. )  . ENBREL SURECLICK 50 MG/ML injection   . EPINEPHrine 0.3 mg/0.3 mL IJ SOAJ injection Inject 0.3 mLs (0.3 mg total) into the muscle as needed for anaphylaxis.  Marland Kitchen ipratropium (ATROVENT) 0.03 % nasal spray Place 1 spray into both nostrils 2 (two) times daily.  Marland Kitchen leflunomide  (ARAVA) 20 MG tablet Take 20 mg by mouth daily.  . magnesium oxide (MAG-OX) 400 MG tablet Take 400 mg by mouth daily.  . Melatonin 5 MG TABS Take 1 tablet by mouth at bedtime as needed.  . metoprolol succinate (TOPROL XL) 50 MG 24 hr tablet Take 1 tablet (50 mg total) by mouth daily. Take with or immediately following a meal.  . mirtazapine (REMERON) 15 MG tablet Take 1 tablet (15 mg total) by mouth at bedtime.  . Multiple Vitamins-Minerals (PRESERVISION AREDS 2) CAPS Take 1 Dose by mouth 2 (two) times daily. For eye health  . omeprazole (PRILOSEC) 40 MG capsule Take 1 capsule (40 mg total) by mouth daily.  . polycarbophil (FIBERCON) 625 MG tablet Take 1 tablet (625 mg total) by mouth daily.  Marland Kitchen SYNTHROID 75 MCG tablet TAKE 1 TABLET DAILY BEFORE BREAKFAST FOR HYPOTHYROIDISM  . valsartan (DIOVAN) 80 MG tablet Take 1 tablet (80 mg total) by mouth daily.  . vitamin B-12 (CYANOCOBALAMIN) 1000 MCG tablet Take  1,000 mcg by mouth daily.   Marland Kitchen acetaminophen (TYLENOL) 500 MG tablet Take 1,000 mg by mouth 2 (two) times daily as needed for moderate pain.   . Azelastine-Fluticasone 137-50 MCG/ACT SUSP Place 1 spray into the nose daily as needed (allergies).  . Biotin 5000 MCG CAPS Take 1 capsule by mouth daily.  . [DISCONTINUED] aspirin EC 81 MG tablet Take 81 mg by mouth daily.  . [DISCONTINUED] Probiotic Product (ALIGN) 4 MG CAPS Take 1 capsule by mouth daily.   No facility-administered encounter medications on file as of 10/19/2019.    Review of Systems  Constitutional: Negative for activity change, appetite change, chills, diaphoresis, fatigue, fever and unexpected weight change.  HENT: Negative for congestion.   Respiratory: Negative for cough, shortness of breath and wheezing.   Cardiovascular: Negative for chest pain, palpitations and leg swelling.  Gastrointestinal: Negative for abdominal distention, abdominal pain, constipation and diarrhea.  Genitourinary: Negative for difficulty urinating and  dysuria.  Musculoskeletal: Positive for arthralgias and gait problem. Negative for back pain, joint swelling and myalgias.  Neurological: Negative for dizziness, tremors, seizures, syncope, facial asymmetry, speech difficulty, weakness, light-headedness, numbness and headaches.  Psychiatric/Behavioral: Negative for agitation, behavioral problems and confusion.       Memory loss    Vitals:   10/19/19 1142  BP: (!) 155/82  Pulse: 79  Resp: 17  Temp: 97.7 F (36.5 C)  SpO2: 96%   There is no height or weight on file to calculate BMI. Physical Exam Vitals and nursing note reviewed.  Constitutional:      General: She is not in acute distress.    Appearance: She is not diaphoretic.  HENT:     Head: Normocephalic and atraumatic.  Neck:     Vascular: No JVD.  Cardiovascular:     Rate and Rhythm: Normal rate and regular rhythm.     Heart sounds: No murmur heard.   Pulmonary:     Effort: Pulmonary effort is normal. No respiratory distress.     Breath sounds: Normal breath sounds. No wheezing.  Abdominal:     General: Bowel sounds are normal. There is no distension.     Palpations: Abdomen is soft.     Tenderness: There is no abdominal tenderness.  Musculoskeletal:     Right lower leg: No edema.     Left lower leg: No edema.  Skin:    General: Skin is warm and dry.  Neurological:     General: No focal deficit present.     Mental Status: She is alert and oriented to person, place, and time. Mental status is at baseline.  Psychiatric:        Mood and Affect: Mood normal.     Labs reviewed: Basic Metabolic Panel: Recent Labs    02/03/19 0000 04/20/19 1156 07/14/19 0000 07/14/19 0600  NA 138 137 139  --   K 5.1 4.5 4.5  --   CL 100 100 104  --   CO2 23* 26  --   --   GLUCOSE  --  110*  --   --   BUN 20 15 18   --   CREATININE 0.8 0.93 0.7  --   CALCIUM 9.3 9.0  --  9.1   Liver Function Tests: Recent Labs    02/03/19 0000 04/20/19 1156  AST 23 27  ALT 19 23    ALKPHOS 86 71  BILITOT  --  1.0  PROT  --  6.9  ALBUMIN 4.6 4.3  No results for input(s): LIPASE, AMYLASE in the last 8760 hours. No results for input(s): AMMONIA in the last 8760 hours. CBC: Recent Labs    02/03/19 0000 04/20/19 1156 07/14/19 0600  WBC 5.6 5.9 5.3  NEUTROABS  --  2.9  --   HGB 11.7* 12.3 10.8*  HCT 35* 36.8 32*  MCV  --  104.8*  --   PLT  --  163 158   Cardiac Enzymes: No results for input(s): CKTOTAL, CKMB, CKMBINDEX, TROPONINI in the last 8760 hours. BNP: Invalid input(s): POCBNP CBG: No results for input(s): GLUCAP in the last 8760 hours.  Procedures and Imaging Studies During Stay: No results found.  Assessment/Plan:    1. History of stroke without residual deficits Continues on a statin LDL 58 on 02/03/19 Continues on Eliquis for CVA risk reduction She is back to baseline and may return [place] with med management. We continue to recommend transfer to AL before her memory loss worsens. F/U with Dr. Mariea Clonts in two weeks.   2. Mild cognitive impairment with memory loss MMSE - Mini Mental State Exam 07/13/2019 01/28/2018  Orientation to time 5 5  Orientation to Place 5 5  Registration 3 3  Attention/ Calculation 5 5  Recall 3 3  Language- name 2 objects 2 2  Language- repeat 1 1  Language- follow 3 step command 3 3  Language- read & follow direction 1 1  Write a sentence 1 1  Copy design 1 1  Total score 30 30  Needs med management when at home.  Neuro notes indicate possible cerebral amyloid angiopathy, she will f/u with them in 4-6 months   3. Rheumatoid arthritis involving hand with negative rheumatoid factor, unspecified laterality (HCC) Symptoms are well controlled with her current regimen Arava 20 mg qd and Enbrel inj  4. Essential hypertension BP is borderline but given her age and fall risk would not change her meds at this time. (SBP 150-160 range) Continue Diovan 80 mg qd  5. Primary osteoarthritis of both knees Should avoid  aggressive exercise or strain Continue tylenol as needed for pain  6. Afib Rate is controlled with metoprolol 50 mg qdaily Continue Eliquis as above    Patient is being discharged with the following home health services:  Med management    Patient has been advised to f/u with their PCP in 1-2 weeks to for a transitions of care visit.  Social services at their facility was responsible for arranging this appointment.  Pt was provided with adequate prescriptions of noncontrolled medications to reach the scheduled appointment .  For controlled substances, a limited supply was provided as appropriate for the individual patient.  If the pt normally receives these medications from a pain clinic or has a contract with another physician, these medications should be received from that clinic or physician only).    Future labs/tests needed:

## 2019-10-20 ENCOUNTER — Encounter: Payer: Self-pay | Admitting: Internal Medicine

## 2019-10-20 NOTE — Telephone Encounter (Signed)
Message Routed to Dr. Reed MD 

## 2019-10-21 DIAGNOSIS — M6389 Disorders of muscle in diseases classified elsewhere, multiple sites: Secondary | ICD-10-CM | POA: Diagnosis not present

## 2019-10-21 DIAGNOSIS — G458 Other transient cerebral ischemic attacks and related syndromes: Secondary | ICD-10-CM | POA: Diagnosis not present

## 2019-10-21 DIAGNOSIS — R2689 Other abnormalities of gait and mobility: Secondary | ICD-10-CM | POA: Diagnosis not present

## 2019-10-21 DIAGNOSIS — R41841 Cognitive communication deficit: Secondary | ICD-10-CM | POA: Diagnosis not present

## 2019-10-21 DIAGNOSIS — R278 Other lack of coordination: Secondary | ICD-10-CM | POA: Diagnosis not present

## 2019-10-21 DIAGNOSIS — Z8673 Personal history of transient ischemic attack (TIA), and cerebral infarction without residual deficits: Secondary | ICD-10-CM | POA: Diagnosis not present

## 2019-10-22 ENCOUNTER — Encounter: Payer: Self-pay | Admitting: Adult Health

## 2019-10-22 DIAGNOSIS — R2689 Other abnormalities of gait and mobility: Secondary | ICD-10-CM | POA: Diagnosis not present

## 2019-10-22 DIAGNOSIS — Z8673 Personal history of transient ischemic attack (TIA), and cerebral infarction without residual deficits: Secondary | ICD-10-CM | POA: Diagnosis not present

## 2019-10-22 DIAGNOSIS — G458 Other transient cerebral ischemic attacks and related syndromes: Secondary | ICD-10-CM | POA: Diagnosis not present

## 2019-10-22 DIAGNOSIS — M6389 Disorders of muscle in diseases classified elsewhere, multiple sites: Secondary | ICD-10-CM | POA: Diagnosis not present

## 2019-10-22 DIAGNOSIS — R41841 Cognitive communication deficit: Secondary | ICD-10-CM | POA: Diagnosis not present

## 2019-10-22 DIAGNOSIS — R278 Other lack of coordination: Secondary | ICD-10-CM | POA: Diagnosis not present

## 2019-10-23 DIAGNOSIS — G458 Other transient cerebral ischemic attacks and related syndromes: Secondary | ICD-10-CM | POA: Diagnosis not present

## 2019-10-23 DIAGNOSIS — R2689 Other abnormalities of gait and mobility: Secondary | ICD-10-CM | POA: Diagnosis not present

## 2019-10-23 DIAGNOSIS — M6389 Disorders of muscle in diseases classified elsewhere, multiple sites: Secondary | ICD-10-CM | POA: Diagnosis not present

## 2019-10-23 DIAGNOSIS — R41841 Cognitive communication deficit: Secondary | ICD-10-CM | POA: Diagnosis not present

## 2019-10-23 DIAGNOSIS — R278 Other lack of coordination: Secondary | ICD-10-CM | POA: Diagnosis not present

## 2019-10-23 DIAGNOSIS — Z8673 Personal history of transient ischemic attack (TIA), and cerebral infarction without residual deficits: Secondary | ICD-10-CM | POA: Diagnosis not present

## 2019-10-27 DIAGNOSIS — R41841 Cognitive communication deficit: Secondary | ICD-10-CM | POA: Diagnosis not present

## 2019-10-27 DIAGNOSIS — G458 Other transient cerebral ischemic attacks and related syndromes: Secondary | ICD-10-CM | POA: Diagnosis not present

## 2019-10-27 DIAGNOSIS — M6389 Disorders of muscle in diseases classified elsewhere, multiple sites: Secondary | ICD-10-CM | POA: Diagnosis not present

## 2019-10-27 DIAGNOSIS — R278 Other lack of coordination: Secondary | ICD-10-CM | POA: Diagnosis not present

## 2019-10-27 DIAGNOSIS — Z8673 Personal history of transient ischemic attack (TIA), and cerebral infarction without residual deficits: Secondary | ICD-10-CM | POA: Diagnosis not present

## 2019-10-29 ENCOUNTER — Ambulatory Visit: Payer: Self-pay | Admitting: Internal Medicine

## 2019-10-29 DIAGNOSIS — R278 Other lack of coordination: Secondary | ICD-10-CM | POA: Diagnosis not present

## 2019-10-29 DIAGNOSIS — Z8673 Personal history of transient ischemic attack (TIA), and cerebral infarction without residual deficits: Secondary | ICD-10-CM | POA: Diagnosis not present

## 2019-10-29 DIAGNOSIS — G458 Other transient cerebral ischemic attacks and related syndromes: Secondary | ICD-10-CM | POA: Diagnosis not present

## 2019-10-29 DIAGNOSIS — M6389 Disorders of muscle in diseases classified elsewhere, multiple sites: Secondary | ICD-10-CM | POA: Diagnosis not present

## 2019-10-29 DIAGNOSIS — R41841 Cognitive communication deficit: Secondary | ICD-10-CM | POA: Diagnosis not present

## 2019-10-30 ENCOUNTER — Telehealth: Payer: Self-pay

## 2019-10-30 NOTE — Telephone Encounter (Signed)
Left message requesting pts daughter, Hosie Spangle, return call re her MyChart message.

## 2019-10-30 NOTE — Telephone Encounter (Signed)
Let them know that Dr. Tomi Likens is out of the office.  The good news is actually that it has been bilateral as most strokes would only affect one side of the body.  Since symptoms are gone, lets hold off and see if they recur.  If so, I would recommend ER eval.

## 2019-10-30 NOTE — Telephone Encounter (Signed)
Spoke at length about the MyChart message -   Dr Tomi Likens I am sorry to bother you-my mother has twice in the last week had a 10-15 minute episode of numbness, first on her right side (hand,arm and face) and tonight on her left side (same pattern).   Each time the feeling comes back with no intervention but she is concerned amd doesn't understand why this keeps happening.   Is this anything to be concerned about? Each time the [facility] nurse asks if she wants to go to the ER but by that time the episode has passed so she says no.   Informed her of Dr Doristine Devoid reply -   Let them know that Dr. Tomi Likens is out of the office.  The good news is actually that it has been bilateral as most strokes would only affect one side of the body.  Since symptoms are gone, lets hold off and see if they recur.  If so, I would recommend ER eval.  Daughter states that she understands recommendation. She says that symptoms pt has are unilateral for each episode. She has had a CVA in the past and she has had a couple of these brief episodes in the past couple of months, that she is aware of, but has had two of them in the past week which concerns her. Pt lives in an independent living situation at [facility] and has notified the nurse each time but refuses the ED which caller agrees is easiest since the ED is very difficult for her mother who is 69. Caller understands that these could be TIAs and understands that she is on Eliquis for hopeful prevention of another CVA. Encouraged her to consider ED if pt has further episode to evaluate her. She will cont to ask her mom to notify nurse of episodes and will make the decision to take pt to ED if episodes are recurrent or longer.   Caller is questioning whether pt would benefit from another MRI before her f/u in Dec 2021? I told her Dr Tomi Likens will be back the middle of next week and can weigh in on this question, she verbalized understanding.

## 2019-11-02 DIAGNOSIS — G458 Other transient cerebral ischemic attacks and related syndromes: Secondary | ICD-10-CM | POA: Diagnosis not present

## 2019-11-02 DIAGNOSIS — R41841 Cognitive communication deficit: Secondary | ICD-10-CM | POA: Diagnosis not present

## 2019-11-02 DIAGNOSIS — M6389 Disorders of muscle in diseases classified elsewhere, multiple sites: Secondary | ICD-10-CM | POA: Diagnosis not present

## 2019-11-02 DIAGNOSIS — R278 Other lack of coordination: Secondary | ICD-10-CM | POA: Diagnosis not present

## 2019-11-02 DIAGNOSIS — Z8673 Personal history of transient ischemic attack (TIA), and cerebral infarction without residual deficits: Secondary | ICD-10-CM | POA: Diagnosis not present

## 2019-11-05 DIAGNOSIS — M6389 Disorders of muscle in diseases classified elsewhere, multiple sites: Secondary | ICD-10-CM | POA: Diagnosis not present

## 2019-11-05 DIAGNOSIS — G458 Other transient cerebral ischemic attacks and related syndromes: Secondary | ICD-10-CM | POA: Diagnosis not present

## 2019-11-05 DIAGNOSIS — R278 Other lack of coordination: Secondary | ICD-10-CM | POA: Diagnosis not present

## 2019-11-05 DIAGNOSIS — R41841 Cognitive communication deficit: Secondary | ICD-10-CM | POA: Diagnosis not present

## 2019-11-05 DIAGNOSIS — Z8673 Personal history of transient ischemic attack (TIA), and cerebral infarction without residual deficits: Secondary | ICD-10-CM | POA: Diagnosis not present

## 2019-11-05 NOTE — Telephone Encounter (Signed)
They may be TIAs.  I'm not really sure if repeating an MRI now would add anything. Even if there isn't a new stroke, it can still be a TIA.  However, she is already on maximum medical management. Cardiologist may consider switching from Eliquis to another anticoagulant such as Xarelto, but that is just a lateral change (one isn't more effective than the other in regards to secondary stroke prevention).  If it occurs again, we recommend going to the ED.

## 2019-11-06 DIAGNOSIS — M6389 Disorders of muscle in diseases classified elsewhere, multiple sites: Secondary | ICD-10-CM | POA: Diagnosis not present

## 2019-11-06 DIAGNOSIS — G458 Other transient cerebral ischemic attacks and related syndromes: Secondary | ICD-10-CM | POA: Diagnosis not present

## 2019-11-06 DIAGNOSIS — R278 Other lack of coordination: Secondary | ICD-10-CM | POA: Diagnosis not present

## 2019-11-06 DIAGNOSIS — R41841 Cognitive communication deficit: Secondary | ICD-10-CM | POA: Diagnosis not present

## 2019-11-06 DIAGNOSIS — Z8673 Personal history of transient ischemic attack (TIA), and cerebral infarction without residual deficits: Secondary | ICD-10-CM | POA: Diagnosis not present

## 2019-11-09 DIAGNOSIS — M6389 Disorders of muscle in diseases classified elsewhere, multiple sites: Secondary | ICD-10-CM | POA: Diagnosis not present

## 2019-11-09 DIAGNOSIS — Z8673 Personal history of transient ischemic attack (TIA), and cerebral infarction without residual deficits: Secondary | ICD-10-CM | POA: Diagnosis not present

## 2019-11-09 DIAGNOSIS — G458 Other transient cerebral ischemic attacks and related syndromes: Secondary | ICD-10-CM | POA: Diagnosis not present

## 2019-11-09 DIAGNOSIS — R278 Other lack of coordination: Secondary | ICD-10-CM | POA: Diagnosis not present

## 2019-11-09 DIAGNOSIS — R41841 Cognitive communication deficit: Secondary | ICD-10-CM | POA: Diagnosis not present

## 2019-11-10 DIAGNOSIS — M6389 Disorders of muscle in diseases classified elsewhere, multiple sites: Secondary | ICD-10-CM | POA: Diagnosis not present

## 2019-11-10 DIAGNOSIS — R278 Other lack of coordination: Secondary | ICD-10-CM | POA: Diagnosis not present

## 2019-11-10 DIAGNOSIS — Z8673 Personal history of transient ischemic attack (TIA), and cerebral infarction without residual deficits: Secondary | ICD-10-CM | POA: Diagnosis not present

## 2019-11-10 DIAGNOSIS — R41841 Cognitive communication deficit: Secondary | ICD-10-CM | POA: Diagnosis not present

## 2019-11-10 DIAGNOSIS — G458 Other transient cerebral ischemic attacks and related syndromes: Secondary | ICD-10-CM | POA: Diagnosis not present

## 2019-11-11 ENCOUNTER — Other Ambulatory Visit: Payer: Self-pay

## 2019-11-11 ENCOUNTER — Encounter: Payer: Self-pay | Admitting: Internal Medicine

## 2019-11-11 ENCOUNTER — Non-Acute Institutional Stay: Payer: Medicare Other | Admitting: Internal Medicine

## 2019-11-11 VITALS — BP 128/72 | HR 96 | Temp 99.3°F | Wt 126.8 lb

## 2019-11-11 DIAGNOSIS — M06049 Rheumatoid arthritis without rheumatoid factor, unspecified hand: Secondary | ICD-10-CM | POA: Diagnosis not present

## 2019-11-11 DIAGNOSIS — G3184 Mild cognitive impairment, so stated: Secondary | ICD-10-CM

## 2019-11-11 DIAGNOSIS — N3281 Overactive bladder: Secondary | ICD-10-CM

## 2019-11-11 DIAGNOSIS — I63412 Cerebral infarction due to embolism of left middle cerebral artery: Secondary | ICD-10-CM

## 2019-11-11 DIAGNOSIS — I10 Essential (primary) hypertension: Secondary | ICD-10-CM

## 2019-11-11 DIAGNOSIS — I482 Chronic atrial fibrillation, unspecified: Secondary | ICD-10-CM | POA: Diagnosis not present

## 2019-11-11 DIAGNOSIS — M6389 Disorders of muscle in diseases classified elsewhere, multiple sites: Secondary | ICD-10-CM | POA: Diagnosis not present

## 2019-11-11 DIAGNOSIS — G458 Other transient cerebral ischemic attacks and related syndromes: Secondary | ICD-10-CM | POA: Diagnosis not present

## 2019-11-11 DIAGNOSIS — F5104 Psychophysiologic insomnia: Secondary | ICD-10-CM

## 2019-11-11 DIAGNOSIS — I4891 Unspecified atrial fibrillation: Secondary | ICD-10-CM | POA: Diagnosis not present

## 2019-11-11 DIAGNOSIS — M170 Bilateral primary osteoarthritis of knee: Secondary | ICD-10-CM

## 2019-11-11 DIAGNOSIS — Z8673 Personal history of transient ischemic attack (TIA), and cerebral infarction without residual deficits: Secondary | ICD-10-CM | POA: Diagnosis not present

## 2019-11-11 DIAGNOSIS — R278 Other lack of coordination: Secondary | ICD-10-CM | POA: Diagnosis not present

## 2019-11-11 DIAGNOSIS — D6869 Other thrombophilia: Secondary | ICD-10-CM | POA: Diagnosis not present

## 2019-11-11 DIAGNOSIS — R41841 Cognitive communication deficit: Secondary | ICD-10-CM | POA: Diagnosis not present

## 2019-11-11 DIAGNOSIS — Z9189 Other specified personal risk factors, not elsewhere classified: Secondary | ICD-10-CM | POA: Diagnosis not present

## 2019-11-11 NOTE — Progress Notes (Signed)
Location:  Occupational psychologist of Service:  Clinic (12)  Provider: Kaysan Peixoto L. Mariea Clonts, D.O., C.M.D.  Code Status: DNR Goals of Care:  Advanced Directives 11/11/2019  Does Patient Have a Medical Advance Directive? Yes  Type of Advance Directive Out of facility DNR (pink MOST or yellow form)  Does patient want to make changes to medical advance directive? No - Patient declined  Copy of [place] in Chart? -  Would patient like information on creating a medical advance directive? -  Pre-existing out of facility DNR order (yellow form or pink MOST form) Pink MOST/Yellow Form most recent copy in chart - Physician notified to receive inpatient order     Chief Complaint  Patient presents with  . Acute Visit    Follow up after Rehab     HPI: Patient is a 86 y.o. female seen today for f/u from rehab stay after stroke 09/27/19 which happened in [place] hospital to [place] and then came to [facility] for rehab.   She does not remember being moved to [place] or any of the hospital stay.  It bothers her that she does not remember it.    Says she keeps seeing doctors and more pills keep getting added.  We reviewed her meds today and dropped a few supplements:   again unnecessarily when she was in rehab.  Biotin, atrovent spray, mag ox, melatonin, and her daughter wanted her epipen taken off the list b/c it's very expensive and got ordered   Has runny nose with dinner and a series of sneezes like 10 of them full-blown.    [name] going to [facility] for her birthday.  Enjoys being with family.    She has been complaining of grogginess.  We decided to stop melatonin and keep remeron.    She does not feel back to normal--forgets things.  Otherwise she's back to normal.  They're developing systems to work with her short-term memory.  Getting the medication mgt during transition period.  Hosie Spangle thinks she'll be ok taking the  medicine if she takes over and does the pillbox.  [name] also remembering to check in with [facility] each morning.       Past Medical History:  Diagnosis Date  . Anemia   . Anxiety   . Arthritis    Back  . Atrial fibrillation ([place])   . Cancer [facility]) 2006   Colon.  Basal Cell Skin cancer- right arm  . Colon cancer ([place])   . Constipation due to pain medication therapy    after heart surgery  . Dysrhythmia    PAF  . GERD (gastroesophageal reflux disease)   . Heart murmur   . Hypertension   . Hypothyroidism   . RA (rheumatoid arthritis) ([place])   . Restless leg   . Seizures ([place])    after Heart Surgey  . Stroke [facility])    TIA- found by neurologist after     Past Surgical History:  Procedure Laterality Date  . ABDOMINAL HYSTERECTOMY  1970   Partial   . COLON RESECTION  2006   cancer  . COLONOSCOPY    . EYE SURGERY Bilateral    Cataract  . MAXIMUM ACCESS (MAS)POSTERIOR LUMBAR INTERBODY FUSION (PLIF) 2 LEVEL N/A 04/12/2015   Procedure: Lumbar Three-Five Decompression, Pedicle Screw Fixation, and Posteriolateral Arthrodesis;  Surgeon: Erline Levine, MD;  Location: [place] NEURO ORS;  Service: Neurosurgery;  Laterality: N/A;  L3-4 L4-5 Maximum access  posterior lumbar fusion, possible interbodies and resection of synovial cyst at L4-5  . MITRAL VALVE REPAIR  01/20/13   Gore-tex cords to P1, P2, and P3. Magic suture to posterior medial commisure, #30 Physio 1 ring. Done in [place]  . TONSILLECTOMY     about 1940  . TRICUSPID VALVE SURGERY  01/20/13   #28 TriAd ring done in [place]    Allergies  Allergen Reactions  . Codeine Other (See Comments)    "just don't take it well"  . Molds & Smuts   . Pollen Extract Swelling  . Bee Venom Swelling and Rash    Outpatient Encounter Medications as of 11/11/2019  Medication Sig  . acetaminophen (TYLENOL) 500 MG tablet Take 1,000 mg by mouth 2 (two) times daily as needed for moderate pain.   Marland Kitchen atorvastatin (LIPITOR) 20 MG tablet TAKE 1 TABLET  DAILY  . Azelastine-Fluticasone 137-50 MCG/ACT SUSP Place 1 spray into the nose daily as needed (allergies).  . Biotin 5000 MCG CAPS Take 1 capsule by mouth daily.  . digoxin (LANOXIN) 0.125 MG tablet TAKE 1 TABLET DAILY  . ELIQUIS 2.5 MG TABS tablet TAKE 1 TABLET TWICE A DAY  . ENBREL SURECLICK 50 MG/ML injection   . EPINEPHrine 0.3 mg/0.3 mL IJ SOAJ injection Inject 0.3 mLs (0.3 mg total) into the muscle as needed for anaphylaxis.  Marland Kitchen ipratropium (ATROVENT) 0.03 % nasal spray Place 1 spray into both nostrils 2 (two) times daily.  Marland Kitchen leflunomide (ARAVA) 20 MG tablet Take 20 mg by mouth daily.  . magnesium oxide (MAG-OX) 400 MG tablet Take 400 mg by mouth daily.  . Melatonin 5 MG TABS Take 1 tablet by mouth at bedtime as needed.  . metoprolol succinate (TOPROL XL) 50 MG 24 hr tablet Take 1 tablet (50 mg total) by mouth daily. Take with or immediately following a meal.  . mirtazapine (REMERON) 15 MG tablet Take 1 tablet (15 mg total) by mouth at bedtime.  . Multiple Vitamins-Minerals (PRESERVISION AREDS 2) CAPS Take 1 Dose by mouth 2 (two) times daily. For eye health  . omeprazole (PRILOSEC) 40 MG capsule Take 1 capsule (40 mg total) by mouth daily.  . polycarbophil (FIBERCON) 625 MG tablet Take 1 tablet (625 mg total) by mouth daily.  Marland Kitchen SYNTHROID 75 MCG tablet TAKE 1 TABLET DAILY BEFORE BREAKFAST FOR HYPOTHYROIDISM  . valsartan (DIOVAN) 80 MG tablet Take 1 tablet (80 mg total) by mouth daily.  . vitamin B-12 (CYANOCOBALAMIN) 1000 MCG tablet Take 1,000 mcg by mouth daily.    No facility-administered encounter medications on file as of 11/11/2019.    Review of Systems:  Review of Systems  Constitutional: Negative for chills, fever and malaise/fatigue.  HENT: Positive for congestion and hearing loss. Negative for sore throat.   Eyes: Negative for blurred vision.  Respiratory: Positive for hemoptysis. Negative for shortness of breath.   Cardiovascular: Negative for chest pain, palpitations and  leg swelling.  Gastrointestinal: Negative for abdominal pain, blood in stool, constipation, diarrhea and melena.  Genitourinary: Positive for frequency and urgency. Negative for dysuria.  Musculoskeletal: Positive for joint pain. Negative for falls.  Skin: Negative for itching and rash.  Neurological: Negative for dizziness and loss of consciousness.  Endo/Heme/Allergies: Bruises/bleeds easily.  Psychiatric/Behavioral: Positive for memory loss. Negative for depression. The patient has insomnia. The patient is not nervous/anxious.     Health Maintenance  Topic Date Due  . INFLUENZA VACCINE  10/25/2019  . TETANUS/TDAP  04/16/2027  . DEXA SCAN  Completed  .  COVID-19 Vaccine  Completed  . PNA vac Low Risk Adult  Completed    Physical Exam: Vitals:   11/11/19 1525  BP: 128/72  Pulse: 96  Temp: 99.3 F (37.4 C)  TempSrc: Temporal  SpO2: 94%  Weight: 126 lb 12.8 oz (57.5 kg)   Body mass index is 23.19 kg/m. Physical Exam Vitals reviewed.  Constitutional:      Appearance: Normal appearance.  HENT:     Head: Normocephalic and atraumatic.  Cardiovascular:     Rate and Rhythm: Rhythm irregular.     Heart sounds: Murmur heard.   Pulmonary:     Effort: Pulmonary effort is normal.     Breath sounds: Normal breath sounds. No wheezing, rhonchi or rales.  Abdominal:     General: Bowel sounds are normal.     Palpations: Abdomen is soft.     Tenderness: There is no abdominal tenderness.  Musculoskeletal:        General: Normal range of motion.     Cervical back: Neck supple.     Right lower leg: No edema.     Left lower leg: No edema.  Skin:    General: Skin is warm and dry.  Neurological:     General: No focal deficit present.     Mental Status: She is alert and oriented to person, place, and time.     Cranial Nerves: No cranial nerve deficit.     Gait: Gait abnormal.     Comments: Uses walker  Psychiatric:        Mood and Affect: Mood normal.     Labs  reviewed: Basic Metabolic Panel: Recent Labs    02/03/19 0000 04/20/19 1156 07/14/19 0000 07/14/19 0600  NA 138 137 139  --   K 5.1 4.5 4.5  --   CL 100 100 104  --   CO2 23* 26  --   --   GLUCOSE  --  110*  --   --   BUN 20 15 18   --   CREATININE 0.8 0.93 0.7  --   CALCIUM 9.3 9.0  --  9.1  TSH  --  2.309  --   --    Liver Function Tests: Recent Labs    02/03/19 0000 04/20/19 1156  AST 23 27  ALT 19 23  ALKPHOS 86 71  BILITOT  --  1.0  PROT  --  6.9  ALBUMIN 4.6 4.3   No results for input(s): LIPASE, AMYLASE in the last 8760 hours. No results for input(s): AMMONIA in the last 8760 hours. CBC: Recent Labs    02/03/19 0000 04/20/19 1156 07/14/19 0600  WBC 5.6 5.9 5.3  NEUTROABS  --  2.9  --   HGB 11.7* 12.3 10.8*  HCT 35* 36.8 32*  MCV  --  104.8*  --   PLT  --  163 158   Lipid Panel: Recent Labs    02/03/19 0000  CHOL 134  HDL 54  LDLCALC 58  TRIG 110   Lab Results  Component Value Date   HGBA1C 5.5 01/12/2013    Assessment/Plan 1. At risk for polypharmacy We reviewed her meds today and dropped a few supplements:   again unnecessarily when she was in rehab.  Biotin, atrovent spray, mag ox, melatonin, and her daughter wanted her epipen taken off the list b/c it's very expensive and got ordered   2. Overactive bladder -did not benefit from myrbetriq so d/c'd  3. Mild cognitive impairment with memory  loss -getting med mgt but her daughter would like to take this over now due to the cost  -she's going on vacation for her bday to [place]--reminded not to overdo it b/c she has wound up in rehab for one thing or another when she's traveled on two prior occasions  4. Rheumatoid arthritis involving hand with negative rheumatoid factor, unspecified laterality ([place]) -cont arava and enbrel with rheum f/u -deformities of hands and feet  -does qualify for covid booster which we agreed she should get upon return from her trip this week  5. Essential  hypertension -bp at goal with current regimen, cont same and monitor  6. Primary osteoarthritis of both knees -ongoing, tends to overdo when travels so advised against this, cont tylenol, may use topical therapy like voltaren gel, biofreeze, ice  7. Psychophysiological insomnia -cont remeron which has been effective -there was question if the added melatonin was creating sedation the next day so we opted to stop it  8. Chronic atrial fibrillation (HCC) -HR is very irregular on exam, cont toprol xl to control rate and digoxin, anticoagulation  9.  Hypercoagulable state due to afib [facility]) -cont her eliquis as per cardiology/neuro recommendations to prevent further embolic phenomena/strokes  Labs/tests ordered:  No new Next appt:  12/02/2019  Linkyn Gobin L. Nimco Bivens, D.O. [facility] [address], [place] [address] Cell Phone (Mon-Fri 8am-5pm):  [phone] On Call:  [phone] & follow prompts after 5pm & weekends Office Phone:  [phone] Office Fax:  [phone]

## 2019-11-16 ENCOUNTER — Encounter: Payer: Self-pay | Admitting: Internal Medicine

## 2019-11-23 DIAGNOSIS — R41841 Cognitive communication deficit: Secondary | ICD-10-CM | POA: Diagnosis not present

## 2019-11-23 DIAGNOSIS — R278 Other lack of coordination: Secondary | ICD-10-CM | POA: Diagnosis not present

## 2019-11-23 DIAGNOSIS — G458 Other transient cerebral ischemic attacks and related syndromes: Secondary | ICD-10-CM | POA: Diagnosis not present

## 2019-11-23 DIAGNOSIS — M6389 Disorders of muscle in diseases classified elsewhere, multiple sites: Secondary | ICD-10-CM | POA: Diagnosis not present

## 2019-11-23 DIAGNOSIS — Z8673 Personal history of transient ischemic attack (TIA), and cerebral infarction without residual deficits: Secondary | ICD-10-CM | POA: Diagnosis not present

## 2019-11-24 ENCOUNTER — Other Ambulatory Visit: Payer: Self-pay | Admitting: Internal Medicine

## 2019-11-24 ENCOUNTER — Encounter: Payer: Self-pay | Admitting: Internal Medicine

## 2019-11-24 DIAGNOSIS — Z8673 Personal history of transient ischemic attack (TIA), and cerebral infarction without residual deficits: Secondary | ICD-10-CM | POA: Diagnosis not present

## 2019-11-24 DIAGNOSIS — G458 Other transient cerebral ischemic attacks and related syndromes: Secondary | ICD-10-CM | POA: Diagnosis not present

## 2019-11-24 DIAGNOSIS — R41841 Cognitive communication deficit: Secondary | ICD-10-CM | POA: Diagnosis not present

## 2019-11-24 DIAGNOSIS — M6389 Disorders of muscle in diseases classified elsewhere, multiple sites: Secondary | ICD-10-CM | POA: Diagnosis not present

## 2019-11-24 DIAGNOSIS — R278 Other lack of coordination: Secondary | ICD-10-CM | POA: Diagnosis not present

## 2019-11-25 DIAGNOSIS — G458 Other transient cerebral ischemic attacks and related syndromes: Secondary | ICD-10-CM | POA: Diagnosis not present

## 2019-11-25 DIAGNOSIS — M6389 Disorders of muscle in diseases classified elsewhere, multiple sites: Secondary | ICD-10-CM | POA: Diagnosis not present

## 2019-11-25 DIAGNOSIS — R41841 Cognitive communication deficit: Secondary | ICD-10-CM | POA: Diagnosis not present

## 2019-11-25 DIAGNOSIS — Z8673 Personal history of transient ischemic attack (TIA), and cerebral infarction without residual deficits: Secondary | ICD-10-CM | POA: Diagnosis not present

## 2019-11-25 DIAGNOSIS — R278 Other lack of coordination: Secondary | ICD-10-CM | POA: Diagnosis not present

## 2019-11-26 DIAGNOSIS — R41841 Cognitive communication deficit: Secondary | ICD-10-CM | POA: Diagnosis not present

## 2019-11-26 DIAGNOSIS — R278 Other lack of coordination: Secondary | ICD-10-CM | POA: Diagnosis not present

## 2019-11-26 DIAGNOSIS — G458 Other transient cerebral ischemic attacks and related syndromes: Secondary | ICD-10-CM | POA: Diagnosis not present

## 2019-11-26 DIAGNOSIS — Z8673 Personal history of transient ischemic attack (TIA), and cerebral infarction without residual deficits: Secondary | ICD-10-CM | POA: Diagnosis not present

## 2019-11-26 DIAGNOSIS — M6389 Disorders of muscle in diseases classified elsewhere, multiple sites: Secondary | ICD-10-CM | POA: Diagnosis not present

## 2019-11-27 DIAGNOSIS — M6389 Disorders of muscle in diseases classified elsewhere, multiple sites: Secondary | ICD-10-CM | POA: Diagnosis not present

## 2019-11-27 DIAGNOSIS — R41841 Cognitive communication deficit: Secondary | ICD-10-CM | POA: Diagnosis not present

## 2019-11-27 DIAGNOSIS — R278 Other lack of coordination: Secondary | ICD-10-CM | POA: Diagnosis not present

## 2019-11-27 DIAGNOSIS — Z8673 Personal history of transient ischemic attack (TIA), and cerebral infarction without residual deficits: Secondary | ICD-10-CM | POA: Diagnosis not present

## 2019-11-27 DIAGNOSIS — G458 Other transient cerebral ischemic attacks and related syndromes: Secondary | ICD-10-CM | POA: Diagnosis not present

## 2019-12-01 DIAGNOSIS — M6389 Disorders of muscle in diseases classified elsewhere, multiple sites: Secondary | ICD-10-CM | POA: Diagnosis not present

## 2019-12-01 DIAGNOSIS — G458 Other transient cerebral ischemic attacks and related syndromes: Secondary | ICD-10-CM | POA: Diagnosis not present

## 2019-12-01 DIAGNOSIS — R41841 Cognitive communication deficit: Secondary | ICD-10-CM | POA: Diagnosis not present

## 2019-12-01 DIAGNOSIS — R278 Other lack of coordination: Secondary | ICD-10-CM | POA: Diagnosis not present

## 2019-12-01 DIAGNOSIS — Z8673 Personal history of transient ischemic attack (TIA), and cerebral infarction without residual deficits: Secondary | ICD-10-CM | POA: Diagnosis not present

## 2019-12-02 ENCOUNTER — Encounter: Payer: Self-pay | Admitting: Internal Medicine

## 2019-12-03 DIAGNOSIS — R41841 Cognitive communication deficit: Secondary | ICD-10-CM | POA: Diagnosis not present

## 2019-12-03 DIAGNOSIS — Z8673 Personal history of transient ischemic attack (TIA), and cerebral infarction without residual deficits: Secondary | ICD-10-CM | POA: Diagnosis not present

## 2019-12-03 DIAGNOSIS — G458 Other transient cerebral ischemic attacks and related syndromes: Secondary | ICD-10-CM | POA: Diagnosis not present

## 2019-12-03 DIAGNOSIS — M6389 Disorders of muscle in diseases classified elsewhere, multiple sites: Secondary | ICD-10-CM | POA: Diagnosis not present

## 2019-12-03 DIAGNOSIS — R278 Other lack of coordination: Secondary | ICD-10-CM | POA: Diagnosis not present

## 2019-12-08 DIAGNOSIS — R41841 Cognitive communication deficit: Secondary | ICD-10-CM | POA: Diagnosis not present

## 2019-12-08 DIAGNOSIS — Z8673 Personal history of transient ischemic attack (TIA), and cerebral infarction without residual deficits: Secondary | ICD-10-CM | POA: Diagnosis not present

## 2019-12-08 DIAGNOSIS — M6389 Disorders of muscle in diseases classified elsewhere, multiple sites: Secondary | ICD-10-CM | POA: Diagnosis not present

## 2019-12-08 DIAGNOSIS — R278 Other lack of coordination: Secondary | ICD-10-CM | POA: Diagnosis not present

## 2019-12-08 DIAGNOSIS — G458 Other transient cerebral ischemic attacks and related syndromes: Secondary | ICD-10-CM | POA: Diagnosis not present

## 2019-12-10 DIAGNOSIS — R278 Other lack of coordination: Secondary | ICD-10-CM | POA: Diagnosis not present

## 2019-12-10 DIAGNOSIS — G458 Other transient cerebral ischemic attacks and related syndromes: Secondary | ICD-10-CM | POA: Diagnosis not present

## 2019-12-10 DIAGNOSIS — Z8673 Personal history of transient ischemic attack (TIA), and cerebral infarction without residual deficits: Secondary | ICD-10-CM | POA: Diagnosis not present

## 2019-12-10 DIAGNOSIS — R41841 Cognitive communication deficit: Secondary | ICD-10-CM | POA: Diagnosis not present

## 2019-12-10 DIAGNOSIS — M6389 Disorders of muscle in diseases classified elsewhere, multiple sites: Secondary | ICD-10-CM | POA: Diagnosis not present

## 2019-12-28 DIAGNOSIS — Z6822 Body mass index (BMI) 22.0-22.9, adult: Secondary | ICD-10-CM | POA: Diagnosis not present

## 2019-12-28 DIAGNOSIS — M150 Primary generalized (osteo)arthritis: Secondary | ICD-10-CM | POA: Diagnosis not present

## 2019-12-28 DIAGNOSIS — M0579 Rheumatoid arthritis with rheumatoid factor of multiple sites without organ or systems involvement: Secondary | ICD-10-CM | POA: Diagnosis not present

## 2019-12-28 DIAGNOSIS — M5136 Other intervertebral disc degeneration, lumbar region: Secondary | ICD-10-CM | POA: Diagnosis not present

## 2020-01-11 ENCOUNTER — Other Ambulatory Visit: Payer: Self-pay | Admitting: Internal Medicine

## 2020-01-11 DIAGNOSIS — I482 Chronic atrial fibrillation, unspecified: Secondary | ICD-10-CM

## 2020-02-03 ENCOUNTER — Encounter: Payer: Medicare Other | Admitting: Family

## 2020-02-05 ENCOUNTER — Ambulatory Visit (INDEPENDENT_AMBULATORY_CARE_PROVIDER_SITE_OTHER): Payer: Medicare Other | Admitting: Orthopedic Surgery

## 2020-02-05 ENCOUNTER — Encounter: Payer: Self-pay | Admitting: Orthopedic Surgery

## 2020-02-05 ENCOUNTER — Other Ambulatory Visit: Payer: Self-pay

## 2020-02-05 ENCOUNTER — Telehealth: Payer: Self-pay

## 2020-02-05 DIAGNOSIS — Z0000 Encounter for general adult medical examination without abnormal findings: Secondary | ICD-10-CM

## 2020-02-05 DIAGNOSIS — M810 Age-related osteoporosis without current pathological fracture: Secondary | ICD-10-CM | POA: Diagnosis not present

## 2020-02-05 NOTE — Telephone Encounter (Signed)
[name], Kathy Velez are scheduled for a virtual visit with your provider today.    Just as we do with appointments in the office, we must obtain your consent to participate.  Your consent will be active for this visit and any virtual visit you may have with one of our providers in the next 365 days.    If you have a MyChart account, I can also send a copy of this consent to you electronically.  All virtual visits are billed to your insurance company just like a traditional visit in the office.  As this is a virtual visit, video technology does not allow for your provider to perform a traditional examination.  This may limit your provider's ability to fully assess your condition.  If your provider identifies any concerns that need to be evaluated in person or the need to arrange testing such as labs, EKG, etc, we will make arrangements to do so.    Although advances in technology are sophisticated, we cannot ensure that it will always work on either your end or our end.  If the connection with a video visit is poor, we may have to switch to a telephone visit.  With either a video or telephone visit, we are not always able to ensure that we have a secure connection.   I need to obtain your verbal consent now.   Are you willing to proceed with your visit today?   Kathy Velez has provided verbal consent on 02/05/2020 for a virtual visit (video or telephone).   Oralia Manis, [facility] 02/05/2020  3:33 PM

## 2020-02-05 NOTE — Progress Notes (Signed)
   This service is provided via telemedicine  No vital signs collected/recorded due to the encounter was a telemedicine visit.   Location of patient (ex: home, work):  Home  Patient consents to a telephone visit:  See tele consent dated 02/05/2020  Location of the provider (ex: office, home):  [facility]  Name of any referring provider:  Dr. Hollace Kinnier MD  Names of all persons participating in the telemedicine service and their role in the encounter:  Marisa Cyphers RMA, Windell Moulding NP, Patient  Time spent on call:  8 min

## 2020-02-05 NOTE — Progress Notes (Signed)
Subjective:   Kathy Velez is a 85 y.o. female who presents for Medicare Annual (Subsequent) preventive examination.  Review of Systems           Objective:    There were no vitals filed for this visit. There is no height or weight on file to calculate BMI.  Advanced Directives 02/05/2020 11/11/2019 10/08/2019 10/06/2019 07/08/2019 07/05/2019 05/29/2019  Does Patient Have a Medical Advance Directive? Yes Yes Yes Yes Yes No Yes  Type of Paramedic of [place];Out of facility DNR (pink MOST or yellow form) Out of facility DNR (pink MOST or yellow form) - Out of facility DNR (pink MOST or yellow form) [place] - -  Does patient want to make changes to medical advance directive? No - Patient declined No - Patient declined - No - Patient declined No - Patient declined - -  Copy of [place] in Chart? Yes - validated most recent copy scanned in chart (See row information) - - - Yes - validated most recent copy scanned in chart (See row information) - -  Would patient like information on creating a medical advance directive? - - - - - No - Patient declined -  Pre-existing out of facility DNR order (yellow form or pink MOST form) - Pink MOST/Yellow Form most recent copy in chart - Physician notified to receive inpatient order - Pink MOST/Yellow Form most recent copy in chart - Physician notified to receive inpatient order - - -    Current Medications (verified) Outpatient Encounter Medications as of 02/05/2020  Medication Sig  . acetaminophen (TYLENOL) 500 MG tablet Take 1,000 mg by mouth 2 (two) times daily as needed for moderate pain.   Marland Kitchen atorvastatin (LIPITOR) 20 MG tablet TAKE 1 TABLET DAILY  . Azelastine-Fluticasone 137-50 MCG/ACT SUSP Place 1 spray into the nose daily as needed (allergies).  Marland Kitchen digoxin (LANOXIN) 0.125 MG tablet TAKE 1 TABLET DAILY  . ELIQUIS 2.5 MG TABS tablet TAKE 1 TABLET TWICE A DAY  . ENBREL SURECLICK 50 MG/ML  injection   . metoprolol succinate (TOPROL-XL) 50 MG 24 hr tablet TAKE 1 TABLET DAILY  . mirtazapine (REMERON) 15 MG tablet Take 1 tablet (15 mg total) by mouth at bedtime.  . Multiple Vitamins-Minerals (PRESERVISION AREDS 2) CAPS Take 1 Dose by mouth 2 (two) times daily. For eye health  . omeprazole (PRILOSEC) 40 MG capsule Take 1 capsule (40 mg total) by mouth daily.  Marland Kitchen SYNTHROID 75 MCG tablet TAKE 1 TABLET DAILY BEFORE BREAKFAST FOR HYPOTHYROIDISM  . valsartan (DIOVAN) 80 MG tablet Take 1 tablet (80 mg total) by mouth daily.  . vitamin B-12 (CYANOCOBALAMIN) 1000 MCG tablet Take 1,000 mcg by mouth daily.   Marland Kitchen leflunomide (ARAVA) 20 MG tablet Take 20 mg by mouth daily.  . polycarbophil (FIBERCON) 625 MG tablet Take 1 tablet (625 mg total) by mouth daily.   No facility-administered encounter medications on file as of 02/05/2020.    Allergies (verified) Codeine, Molds & smuts, Pollen extract, and Bee venom   History: Past Medical History:  Diagnosis Date  . Anemia   . Anxiety   . Arthritis    Back  . Atrial fibrillation ([place])   . Cancer [facility]) 2006   Colon.  Basal Cell Skin cancer- right arm  . Colon cancer ([place])   . Constipation due to pain medication therapy    after heart surgery  . Dysrhythmia    PAF  . GERD (gastroesophageal reflux  disease)   . Heart murmur   . Hypertension   . Hypothyroidism   . RA (rheumatoid arthritis) ([place])   . Restless leg   . Seizures ([place])    after Heart Surgey  . Stroke [facility])    TIA- found by neurologist after    Past Surgical History:  Procedure Laterality Date  . ABDOMINAL HYSTERECTOMY  1970   Partial   . COLON RESECTION  2006   cancer  . COLONOSCOPY    . EYE SURGERY Bilateral    Cataract  . MAXIMUM ACCESS (MAS)POSTERIOR LUMBAR INTERBODY FUSION (PLIF) 2 LEVEL N/A 04/12/2015   Procedure: Lumbar Three-Five Decompression, Pedicle Screw Fixation, and Posteriolateral Arthrodesis;  Surgeon: Erline Levine, MD;  Location: [place] NEURO ORS;  Service:  Neurosurgery;  Laterality: N/A;  L3-4 L4-5 Maximum access posterior lumbar fusion, possible interbodies and resection of synovial cyst at L4-5  . MITRAL VALVE REPAIR  01/20/13   Gore-tex cords to P1, P2, and P3. Magic suture to posterior medial commisure, #30 Physio 1 ring. Done in [place]  . TONSILLECTOMY     about 1940  . TRICUSPID VALVE SURGERY  01/20/13   #28 TriAd ring done in [place]   Family History  Problem Relation Age of Onset  . Lung disease Mother 28  . Alcohol abuse Son   . Heart disease Father 23   Social History   Socioeconomic History  . Marital status: Widowed    Spouse name: Not on file  . Number of children: Not on file  . Years of education: Not on file  . Highest education level: Not on file  Occupational History  . Occupation: Automotive engineer    Comment: retired   Tobacco Use  . Smoking status: Never Smoker  . Smokeless tobacco: Never Used  Vaping Use  . Vaping Use: Never used  Substance and Sexual Activity  . Alcohol use: Yes    Alcohol/week: 1.0 standard drink    Types: 1 Glasses of wine per week    Comment: 1-2 per week  . Drug use: No  . Sexual activity: Not Currently  Other Topics Concern  . Not on file  Social History Narrative   Social History      Diet? Healthy- low salt, sugar, fat      Do you drink/eat things with caffeine? On occasion      Marital status?        widow                        What year were you married? 1958      Do you live in a house, apartment, assisted living, condo, trailer, etc.? apartment      Is it one or more stories? one      How many persons live in your home? one      Do you have any pets in your home? (please list) no      Highest level of education completed? 4 year college      Current or past profession: Statistician      Do you exercise?             yes                         Type & how often? Classes- senior retirement community/ some walking      Advanced Directives       Do you have  a living will?      Do you have a DNR form?                                  If not, do you want to discuss one?      Do you have signed POA/HPOA for forms?       Functional Status Completed by: Daughter, Di Kindle      Do you have difficulty bathing or dressing yourself? no      Do you have difficulty preparing food or eating? no      Do you have difficulty managing your medications? no      Do you have difficulty managing your finances? no      Do you have difficulty affording your medications? no   Right handed   Social Determinants of Health   Financial Resource Strain:   . Difficulty of Paying Living Expenses: Not on file  Food Insecurity:   . Worried About Charity fundraiser in the Last Year: Not on file  . Ran Out of Food in the Last Year: Not on file  Transportation Needs:   . Lack of Transportation (Medical): Not on file  . Lack of Transportation (Non-Medical): Not on file  Physical Activity:   . Days of Exercise per Week: Not on file  . Minutes of Exercise per Session: Not on file  Stress:   . Feeling of Stress : Not on file  Social Connections:   . Frequency of Communication with Friends and Family: Not on file  . Frequency of Social Gatherings with Friends and Family: Not on file  . Attends Religious Services: Not on file  . Active Member of Clubs or Organizations: Not on file  . Attends Archivist Meetings: Not on file  . Marital Status: Not on file    Tobacco Counseling Counseling given: Not Answered   Clinical Intake:  Pre-visit preparation completed: Yes  Pain : No/denies pain     BMI - recorded: 23.19 Nutritional Status: BMI of 19-24  Normal Nutritional Risks: None Diabetes: No  How often do you need to have someone help you when you read instructions, pamphlets, or other written materials from your doctor or pharmacy?: 1 - Never What is the last grade level you completed in school?:  College  Diabetic?No  Interpreter Needed?: No  Information entered by :: Dung Prien AGNP-C   Activities of Daily Living No flowsheet data found.  Patient Care Team: Gayland Curry, DO as PCP - General (Geriatric Medicine) Debara Pickett Nadean Corwin, MD as PCP - Cardiology (Cardiology) Pieter Partridge, DO as Consulting Physician (Neurology)  Indicate any recent Medical Services you may have received from other than [facility] providers in the past year (date may be approximate).     Assessment:   This is a routine wellness examination for Kathy Velez.  Hearing/Vision screen  Hearing Screening   125Hz  250Hz  500Hz  1000Hz  2000Hz  3000Hz  4000Hz  6000Hz  8000Hz   Right ear:           Left ear:           Comments: Yes   Vision Screening Comments: No but said she needs to  Dietary issues and exercise activities discussed:    Goals   None    Depression Screen PHQ 2/9 Scores 02/05/2020 08/05/2019 05/29/2019 03/09/2019 02/02/2019 11/12/2018 08/20/2018  PHQ - 2 Score 0 0 0 0 0 0 0  Fall Risk Fall Risk  02/05/2020 11/11/2019 10/08/2019 08/05/2019 07/06/2019  Falls in the past year? 0 0 1 0 0  Number falls in past yr: 0 0 0 0 0  Injury with Fall? 0 0 0 0 0  Risk for fall due to : - - - - Impaired mobility  Risk for fall due to: Comment - - - - -  Follow up - - - - Falls evaluation completed  Comment - - - - -    Any stairs in or around the home? Yes  If so, are there any without handrails? Yes  Home free of loose throw rugs in walkways, pet beds, electrical cords, etc? Yes  Adequate lighting in your home to reduce risk of falls? Yes   ASSISTIVE DEVICES UTILIZED TO PREVENT FALLS:  Life alert? Yes  Use of a cane, walker or w/c? Yes  Grab bars in the bathroom? Yes  Shower chair or bench in shower? Yes  Elevated toilet seat or a handicapped toilet? Yes   TIMED UP AND GO:  Was the test performed? No .  Length of time to ambulate 10 feet: N/A sec.   Gait slow and steady with assistive  device  Cognitive Function: MMSE - Mini Mental State Exam 07/13/2019 01/28/2018  Orientation to time 5 5  Orientation to Place 5 5  Registration 3 3  Attention/ Calculation 5 5  Recall 3 3  Language- name 2 objects 2 2  Language- repeat 1 1  Language- follow 3 step command 3 3  Language- read & follow direction 1 1  Write a sentence 1 1  Copy design 1 1  Total score 30 30     6CIT Screen 02/05/2020 02/02/2019  What Year? 0 points 0 points  What month? 0 points 0 points  What time? 0 points 0 points  Count back from 20 0 points 0 points  Months in reverse 2 points 0 points  Repeat phrase 2 points 0 points  Total Score 4 0    Immunizations Immunization History  Administered Date(s) Administered  . Fluad Quad(high Dose 65+) 01/05/2019  . H1N1 04/07/2008  . Influenza Whole 12/29/2008, 12/22/2009  . Influenza, High Dose Seasonal PF 01/15/2012, 12/23/2014  . Influenza,inj,Quad PF,6+ Mos 01/22/2014, 12/10/2016, 01/17/2018  . Influenza,trivalent, recombinat, inj, PF 01/05/2016  . Influenza-Unspecified 04/07/2008, 12/15/2014  . Moderna SARS-COVID-2 Vaccination 04/07/2019, 05/05/2019  . PPD Test 07/17/2010, 04/19/2015, 04/25/2015  . Pneumococcal Conjugate-13 11/10/2013  . Pneumococcal Polysaccharide-23 12/27/2010, 12/05/2016  . Tdap 04/15/2017  . Zoster Recombinat (Shingrix) 04/09/2017, 06/18/2017    TDAP status: Up to date Flu Vaccine status: Up to date Pneumococcal vaccine status: Up to date Covid-19 vaccine status: Completed vaccines  Qualifies for Shingles Vaccine? Yes   Zostavax completed No   Shingrix Completed?: Yes  Screening Tests Health Maintenance  Topic Date Due  . INFLUENZA VACCINE  10/25/2019  . TETANUS/TDAP  04/16/2027  . DEXA SCAN  Completed  . COVID-19 Vaccine  Completed  . PNA vac Low Risk Adult  Completed    Health Maintenance  Health Maintenance Due  Topic Date Due  . INFLUENZA VACCINE  10/25/2019    Colorectal cancer screening: No longer  required.  Mammogram status: No longer required.  Bone Density status: Completed 11/21/2017. Results reflect: Bone density results: OSTEOPOROSIS. Repeat every 2 years.  Lung Cancer Screening: (Low Dose CT Chest recommended if Age 26-80 years, 30 pack-year currently smoking OR have quit w/in 15years.) does not qualify.   Lung Cancer  Screening Referral: NO  Additional Screening:  Hepatitis C Screening: does not qualify; Completed NO  Vision Screening: Recommended annual ophthalmology exams for early detection of glaucoma and other disorders of the eye. Is the patient up to date with their annual eye exam?  No  Who is the provider or what is the name of the office in which the patient attends annual eye exams? Dr. Ellie Lunch If pt is not established with a provider, would they like to be referred to a provider to establish care? No .   Dental Screening: Recommended annual dental exams for proper oral hygiene  Community Resource Referral / Chronic Care Management: CRR required this visit?  No   CCM required this visit?  No      Plan:     I have personally reviewed and noted the following in the patient's chart:   . Medical and social history . Use of alcohol, tobacco or illicit drugs  . Current medications and supplements . Functional ability and status . Nutritional status . Physical activity . Advanced directives . List of other physicians . Hospitalizations, surgeries, and ER visits in previous 12 months . Vitals . Screenings to include cognitive, depression, and falls . Referrals and appointments  In addition, I have reviewed and discussed with patient certain preventive protocols, quality metrics, and best practice recommendations. A written personalized care plan for preventive services as well as general preventive health recommendations were provided to patient.    Telephone Encounter via Telephone Note   I connected with Kathy Velez telephone on 11/12/2021and  verified that I am speaking with the correct person using two identifiers.   Location: [place] Patient: Kathy Velez Provider: Windell Moulding AGNP-C   I discussed the limitations, risks, security and privacy concerns of performing an evaluation and management service by telephone and the availability of in person appointments. I also discussed with the patient that there may be a patient responsible charge related to this service. The patient expressed understanding and agreed to proceed.    I discussed the assessment and treatment plan with the patient. The patient was provided an opportunity to ask questions and all were answered. The patient agreed with the plan and demonstrated an understanding of the instructions.    The patient was advised to call back or seek an in-person evaluation if the symptoms worsen or if the condition fails to improve as anticipated.   I provided 31 minutes of non-face-to-face time during this encounter.   Windell Moulding, AGNP-C Avs printed and mailed

## 2020-02-05 NOTE — Patient Instructions (Signed)
Kathy Velez , Thank you for taking time to come for your Medicare Wellness Visit. I appreciate your ongoing commitment to your health goals. Please review the following plan we discussed and let me know if I can assist you in the future.   Screening recommendations/referrals: Colonoscopy: N/A Mammogram N/A Bone Density : recommended, will place referral Recommended yearly ophthalmology/optometry visit for glaucoma screening and checkup: scheduled at [facility] Recommended yearly dental visit for hygiene and checkup  Vaccinations: Influenza vaccine: up to date Pneumococcal vaccine: up to date Tdap vaccine: up to date Shingles vaccine: up to date  Advanced directives: completed  Conditions/risks identified: hypertension, hyperlipidemia, stroke, osteoporosis  Next appointment: 04/06/2020  Preventive Care 84 Years and Older, Female Preventive care refers to lifestyle choices and visits with your health care provider that can promote health and wellness. What does preventive care include?  A yearly physical exam. This is also called an annual well check.  Dental exams once or twice a year.  Routine eye exams. Ask your health care provider how often you should have your eyes checked.  Personal lifestyle choices, including:  Daily care of your teeth and gums.  Regular physical activity.  Eating a healthy diet.  Avoiding tobacco and drug use.  Limiting alcohol use.  Practicing safe sex.  Taking low-dose aspirin every day.  Taking vitamin and mineral supplements as recommended by your health care provider. What happens during an annual well check? The services and screenings done by your health care provider during your annual well check will depend on your age, overall health, lifestyle risk factors, and family history of disease. Counseling  Your health care provider may ask you questions about your:  Alcohol use.  Tobacco use.  Drug use.  Emotional  well-being.  Home and relationship well-being.  Sexual activity.  Eating habits.  History of falls.  Memory and ability to understand (cognition).  Work and work Statistician.  Reproductive health. Screening  You may have the following tests or measurements:  Height, weight, and BMI.  Blood pressure.  Lipid and cholesterol levels. These may be checked every 5 years, or more frequently if you are over 84 years old.  Skin check.  Lung cancer screening. You may have this screening every year starting at age 40 if you have a 30-pack-year history of smoking and currently smoke or have quit within the past 15 years.  Fecal occult blood test (FOBT) of the stool. You may have this test every year starting at age 84.  Flexible sigmoidoscopy or colonoscopy. You may have a sigmoidoscopy every 5 years or a colonoscopy every 10 years starting at age 21.  Hepatitis C blood test.  Hepatitis B blood test.  Sexually transmitted disease (STD) testing.  Diabetes screening. This is done by checking your blood sugar (glucose) after you have not eaten for a while (fasting). You may have this done every 1-3 years.  Bone density scan. This is done to screen for osteoporosis. You may have this done starting at age 84  Mammogram. This may be done every 1-2 years. Talk to your health care provider about how often you should have regular mammograms. Talk with your health care provider about your test results, treatment options, and if necessary, the need for more tests. Vaccines  Your health care provider may recommend certain vaccines, such as:  Influenza vaccine. This is recommended every year.  Tetanus, diphtheria, and acellular pertussis (Tdap, Td) vaccine. You may need a Td booster every 10 years.  Zoster vaccine. You may need this after age 84  Pneumococcal 13-valent conjugate (PCV13) vaccine. One dose is recommended after age 84  Pneumococcal polysaccharide (PPSV23) vaccine. One  dose is recommended after age 84 Talk to your health care provider about which screenings and vaccines you need and how often you need them. This information is not intended to replace advice given to you by your health care provider. Make sure you discuss any questions you have with your health care provider. Document Released: 04/08/2015 Document Revised: 11/30/2015 Document Reviewed: 01/11/2015 Elsevier Interactive Patient Education  2017 [place] Prevention in the Home Falls can cause injuries. They can happen to people of all ages. There are many things you can do to make your home safe and to help prevent falls. What can I do on the outside of my home?  Regularly fix the edges of walkways and driveways and fix any cracks.  Remove anything that might make you trip as you walk through a door, such as a raised step or threshold.  Trim any bushes or trees on the path to your home.  Use bright outdoor lighting.  Clear any walking paths of anything that might make someone trip, such as rocks or tools.  Regularly check to see if handrails are loose or broken. Make sure that both sides of any steps have handrails.  Any raised decks and porches should have guardrails on the edges.  Have any leaves, snow, or ice cleared regularly.  Use sand or salt on walking paths during winter.  Clean up any spills in your garage right away. This includes oil or grease spills. What can I do in the bathroom?  Use night lights.  Install grab bars by the toilet and in the tub and shower. Do not use towel bars as grab bars.  Use non-skid mats or decals in the tub or shower.  If you need to sit down in the shower, use a plastic, non-slip stool.  Keep the floor dry. Clean up any water that spills on the floor as soon as it happens.  Remove soap buildup in the tub or shower regularly.  Attach bath mats securely with double-sided non-slip rug tape.  Do not have throw rugs and other  things on the floor that can make you trip. What can I do in the bedroom?  Use night lights.  Make sure that you have a light by your bed that is easy to reach.  Do not use any sheets or blankets that are too big for your bed. They should not hang down onto the floor.  Have a firm chair that has side arms. You can use this for support while you get dressed.  Do not have throw rugs and other things on the floor that can make you trip. What can I do in the kitchen?  Clean up any spills right away.  Avoid walking on wet floors.  Keep items that you use a lot in easy-to-reach places.  If you need to reach something above you, use a strong step stool that has a grab bar.  Keep electrical cords out of the way.  Do not use floor polish or wax that makes floors slippery. If you must use wax, use non-skid floor wax.  Do not have throw rugs and other things on the floor that can make you trip. What can I do with my stairs?  Do not leave any items on the stairs.  Make sure that there are  handrails on both sides of the stairs and use them. Fix handrails that are broken or loose. Make sure that handrails are as long as the stairways.  Check any carpeting to make sure that it is firmly attached to the stairs. Fix any carpet that is loose or worn.  Avoid having throw rugs at the top or bottom of the stairs. If you do have throw rugs, attach them to the floor with carpet tape.  Make sure that you have a light switch at the top of the stairs and the bottom of the stairs. If you do not have them, ask someone to add them for you. What else can I do to help prevent falls?  Wear shoes that:  Do not have high heels.  Have rubber bottoms.  Are comfortable and fit you well.  Are closed at the toe. Do not wear sandals.  If you use a stepladder:  Make sure that it is fully opened. Do not climb a closed stepladder.  Make sure that both sides of the stepladder are locked into place.  Ask  someone to hold it for you, if possible.  Clearly mark and make sure that you can see:  Any grab bars or handrails.  First and last steps.  Where the edge of each step is.  Use tools that help you move around (mobility aids) if they are needed. These include:  Canes.  Walkers.  Scooters.  Crutches.  Turn on the lights when you go into a dark area. Replace any light bulbs as soon as they burn out.  Set up your furniture so you have a clear path. Avoid moving your furniture around.  If any of your floors are uneven, fix them.  If there are any pets around you, be aware of where they are.  Review your medicines with your doctor. Some medicines can make you feel dizzy. This can increase your chance of falling. Ask your doctor what other things that you can do to help prevent falls. This information is not intended to replace advice given to you by your health care provider. Make sure you discuss any questions you have with your health care provider. Document Released: 01/06/2009 Document Revised: 08/18/2015 Document Reviewed: 04/16/2014 Elsevier Interactive Patient Education  2017 [employer].

## 2020-02-22 ENCOUNTER — Encounter: Payer: Self-pay | Admitting: Internal Medicine

## 2020-02-22 ENCOUNTER — Other Ambulatory Visit: Payer: Self-pay

## 2020-02-22 ENCOUNTER — Ambulatory Visit
Admission: RE | Admit: 2020-02-22 | Discharge: 2020-02-22 | Disposition: A | Discharge status: Home / Self Care | Payer: Medicare Other | Source: Ambulatory Visit | Attending: Internal Medicine | Admitting: Internal Medicine

## 2020-02-22 ENCOUNTER — Ambulatory Visit (INDEPENDENT_AMBULATORY_CARE_PROVIDER_SITE_OTHER): Payer: Medicare Other | Admitting: Internal Medicine

## 2020-02-22 VITALS — BP 130/80 | HR 70 | Temp 97.6°F | Ht 62.0 in | Wt 124.8 lb

## 2020-02-22 DIAGNOSIS — I10 Essential (primary) hypertension: Secondary | ICD-10-CM | POA: Diagnosis not present

## 2020-02-22 DIAGNOSIS — J0101 Acute recurrent maxillary sinusitis: Secondary | ICD-10-CM | POA: Diagnosis not present

## 2020-02-22 DIAGNOSIS — R0689 Other abnormalities of breathing: Secondary | ICD-10-CM

## 2020-02-22 DIAGNOSIS — R059 Cough, unspecified: Secondary | ICD-10-CM | POA: Diagnosis not present

## 2020-02-22 DIAGNOSIS — I63412 Cerebral infarction due to embolism of left middle cerebral artery: Secondary | ICD-10-CM

## 2020-02-22 NOTE — Progress Notes (Signed)
Location:  [facility] clinic Provider: Aimee Heldman L. Mariea Clonts, D.O., C.M.D.  Code Status: DNR Goals of Care:  Advanced Directives 02/05/2020  Does Patient Have a Medical Advance Directive? Yes  Type of Paramedic of [place];Out of facility DNR (pink MOST or yellow form)  Does patient want to make changes to medical advance directive? No - Patient declined  Copy of [place] in Chart? Yes - validated most recent copy scanned in chart (See row information)  Would patient like information on creating a medical advance directive? -  Pre-existing out of facility DNR order (yellow form or pink MOST form) -     Chief Complaint  Patient presents with  . Acute Visit    Patient thinks she may have a sinus infection. Congestion in chest. Patient is bringingup dark green phlegm. Patient states has been going on for abotut a week.No fever, chills, headache. A little shortness of breath. Patient has tried sudafed. Patient has had cough.    HPI: Patient is a 86 y.o. female seen today for an acute visit for coughing up green sputum, and feeling sob.  Tried sudafed w/o improvement.  She wonders if she has a sinus infection.  She was out of town for thanksgiving for 10 days.  At some point during that, she picked up the sinus congestion and cough.  It's definitely in her chest and in her right maxillary sinus are.  When she blows her nose, a chunk of green comes from that side.  They just got back last night and the clinic nurse said she needed to see me.  No fever, chills and has really felt that bad.  [name] certainly coughed and coughed and keeps bringing up the discolored mucus.  She had taken sudafed and bp was running high.  They are going away again at Christmas and want to be sure she's ok.  Past Medical History:  Diagnosis Date  . Anemia   . Anxiety   . Arthritis    Back  . Atrial fibrillation ([place])   . Cancer [facility]) 2006   Colon.  Basal Cell Skin cancer- right  arm  . Colon cancer ([place])   . Constipation due to pain medication therapy    after heart surgery  . Dysrhythmia    PAF  . GERD (gastroesophageal reflux disease)   . Heart murmur   . Hypertension   . Hypothyroidism   . RA (rheumatoid arthritis) ([place])   . Restless leg   . Seizures ([place])    after Heart Surgey  . Stroke [facility])    TIA- found by neurologist after     Past Surgical History:  Procedure Laterality Date  . ABDOMINAL HYSTERECTOMY  1970   Partial   . COLON RESECTION  2006   cancer  . COLONOSCOPY    . EYE SURGERY Bilateral    Cataract  . MAXIMUM ACCESS (MAS)POSTERIOR LUMBAR INTERBODY FUSION (PLIF) 2 LEVEL N/A 04/12/2015   Procedure: Lumbar Three-Five Decompression, Pedicle Screw Fixation, and Posteriolateral Arthrodesis;  Surgeon: Erline Levine, MD;  Location: [facility];  Service: Neurosurgery;  Laterality: N/A;  L3-4 L4-5 Maximum access posterior lumbar fusion, possible interbodies and resection of synovial cyst at L4-5  . MITRAL VALVE REPAIR  01/20/13   Gore-tex cords to P1, P2, and P3. Magic suture to posterior medial commisure, #30 Physio 1 ring. Done in [place]  . TONSILLECTOMY     about 1940  . TRICUSPID VALVE SURGERY  01/20/13   #28  TriAd ring done in [place]    Allergies  Allergen Reactions  . Codeine Other (See Comments)    "just don't take it well"  . Molds & Smuts   . Pollen Extract Swelling  . Bee Venom Swelling and Rash    Outpatient Encounter Medications as of 02/22/2020  Medication Sig  . acetaminophen (TYLENOL) 500 MG tablet Take 1,000 mg by mouth 2 (two) times daily as needed for moderate pain.   Marland Kitchen atorvastatin (LIPITOR) 20 MG tablet TAKE 1 TABLET DAILY  . Azelastine-Fluticasone 137-50 MCG/ACT SUSP Place 1 spray into the nose daily as needed (allergies).  Marland Kitchen digoxin (LANOXIN) 0.125 MG tablet TAKE 1 TABLET DAILY  . ELIQUIS 2.5 MG TABS tablet TAKE 1 TABLET TWICE A DAY  . ENBREL SURECLICK 50 MG/ML injection   . leflunomide (ARAVA) 20 MG tablet  Take 20 mg by mouth daily.  . metoprolol succinate (TOPROL-XL) 50 MG 24 hr tablet TAKE 1 TABLET DAILY  . mirtazapine (REMERON) 15 MG tablet Take 1 tablet (15 mg total) by mouth at bedtime.  . Multiple Vitamins-Minerals (PRESERVISION AREDS 2) CAPS Take 1 Dose by mouth 2 (two) times daily. For eye health  . omeprazole (PRILOSEC) 40 MG capsule Take 1 capsule (40 mg total) by mouth daily.  . polycarbophil (FIBERCON) 625 MG tablet Take 1 tablet (625 mg total) by mouth daily.  Marland Kitchen SYNTHROID 75 MCG tablet TAKE 1 TABLET DAILY BEFORE BREAKFAST FOR HYPOTHYROIDISM  . valsartan (DIOVAN) 80 MG tablet Take 1 tablet (80 mg total) by mouth daily.  . vitamin B-12 (CYANOCOBALAMIN) 1000 MCG tablet Take 1,000 mcg by mouth daily.    No facility-administered encounter medications on file as of 02/22/2020.    Review of Systems:  Review of Systems  Constitutional: Negative for chills, fever and malaise/fatigue.  HENT: Positive for congestion. Negative for ear pain, sinus pain, sore throat and tinnitus.   Eyes: Negative for blurred vision.  Respiratory: Positive for cough, sputum production and shortness of breath.   Cardiovascular: Negative for chest pain, palpitations and leg swelling.  Gastrointestinal: Negative for abdominal pain, constipation, diarrhea, nausea and vomiting.  Genitourinary: Negative for dysuria.  Musculoskeletal: Positive for joint pain. Negative for falls.  Neurological: Negative for dizziness and loss of consciousness.  Endo/Heme/Allergies: Bruises/bleeds easily.  Psychiatric/Behavioral: Positive for memory loss. Negative for depression. The patient is not nervous/anxious and does not have insomnia.     Health Maintenance  Topic Date Due  . TETANUS/TDAP  04/16/2027  . INFLUENZA VACCINE  Completed  . DEXA SCAN  Completed  . COVID-19 Vaccine  Completed  . PNA vac Low Risk Adult  Completed    Physical Exam: Vitals:   02/22/20 1514  BP: 130/80  Pulse: 70  Temp: 97.6 F (36.4 C)    SpO2: 96%  Weight: 124 lb 12.8 oz (56.6 kg)  Height: 5\' 2"  (1.575 m)   Body mass index is 22.83 kg/m. Physical Exam Constitutional:      General: She is not in acute distress.    Appearance: Normal appearance. She is not ill-appearing or toxic-appearing.     Comments: Well-dressed and groomed as always  HENT:     Head: Normocephalic and atraumatic.     Right Ear: Tympanic membrane, ear canal and external ear normal.     Left Ear: Tympanic membrane, ear canal and external ear normal.     Nose: Congestion present.     Mouth/Throat:     Pharynx: Oropharynx is clear. No oropharyngeal exudate.  Eyes:  Extraocular Movements: Extraocular movements intact.     Pupils: Pupils are equal, round, and reactive to light.  Cardiovascular:     Rate and Rhythm: Normal rate and regular rhythm.     Heart sounds: No murmur heard.   Pulmonary:     Effort: Pulmonary effort is normal.     Comments: Left base diminished Abdominal:     General: Bowel sounds are normal.  Musculoskeletal:        General: Normal range of motion.     Cervical back: Neck supple. No tenderness.     Right lower leg: No edema.     Left lower leg: No edema.  Lymphadenopathy:     Cervical: No cervical adenopathy.  Neurological:     General: No focal deficit present.     Mental Status: She is alert and oriented to person, place, and time. Mental status is at baseline.     Gait: Gait abnormal.     Comments: Uses rollator walker  Psychiatric:        Mood and Affect: Mood normal.     Labs reviewed: Basic Metabolic Panel: Recent Labs    04/20/19 1156 07/14/19 0000 07/14/19 0600  NA 137 139  --   K 4.5 4.5  --   CL 100 104  --   CO2 26  --   --   GLUCOSE 110*  --   --   BUN 15 18  --   CREATININE 0.93 0.7  --   CALCIUM 9.0  --  9.1  TSH 2.309  --   --    Liver Function Tests: Recent Labs    04/20/19 1156  AST 27  ALT 23  ALKPHOS 71  BILITOT 1.0  PROT 6.9  ALBUMIN 4.3   No results for input(s):  LIPASE, AMYLASE in the last 8760 hours. No results for input(s): AMMONIA in the last 8760 hours. CBC: Recent Labs    04/20/19 1156 07/14/19 0600  WBC 5.9 5.3  NEUTROABS 2.9  --   HGB 12.3 10.8*  HCT 36.8 32*  MCV 104.8*  --   PLT 163 158   Lipid Panel: No results for input(s): CHOL, HDL, LDLCALC, TRIG, CHOLHDL, LDLDIRECT in the last 8760 hours. Lab Results  Component Value Date   HGBA1C 5.5 01/12/2013    Assessment/Plan 1. Acute recurrent maxillary sinusitis - DG Chest 2 View -plan to prescribe antibiotic with her immunocompromised status (on RA therapy) -encourage hydration -stop sudafed which was increasing her blood pressure   2. Decreased breath sounds at left lung base -noted today -rule out pneumonia with CXR before choose abx  - DG Chest 2 View  3. Essential hypertension -was due to sudafed suspect, but better now  Labs/tests ordered:  2 view CXR Next appt:  04/06/2020  Kathy Velez L. Nocole Zammit, D.O. [facility] [address], [place] [address] Cell Phone (Mon-Fri 8am-5pm):  [phone] On Call:  [phone] & follow prompts after 5pm & weekends Office Phone:  [phone] Office Fax:  [phone]

## 2020-02-22 NOTE — Patient Instructions (Signed)
Drink 6 glasses of water or juice per day Eat a balanced diet Go get a chest xray today to be sure you do not have pneumonia We will let you know the results when they return and send in an antibiotic.

## 2020-02-23 ENCOUNTER — Other Ambulatory Visit: Payer: Self-pay | Admitting: Internal Medicine

## 2020-02-23 ENCOUNTER — Encounter: Payer: Self-pay | Admitting: Internal Medicine

## 2020-02-23 DIAGNOSIS — J209 Acute bronchitis, unspecified: Secondary | ICD-10-CM

## 2020-02-23 DIAGNOSIS — J0101 Acute recurrent maxillary sinusitis: Secondary | ICD-10-CM

## 2020-02-23 DIAGNOSIS — J440 Chronic obstructive pulmonary disease with acute lower respiratory infection: Secondary | ICD-10-CM

## 2020-02-23 MED ORDER — AZITHROMYCIN 250 MG PO TABS: ORAL_TABLET | ORAL | 0 refills | Status: DC

## 2020-02-23 NOTE — Progress Notes (Signed)
Kathy Velez has some bronchitis.  [name] also had right maxillary sinusitis.  Because she has heart disease and interstitial lung disease in her history, she is 88 and she has not improved on her own,  I will treat her with antibiotics.  Please send a zpak (zithromax 250mg --2 tabs today, then one daily for 4 days) to her pharmacy which should successfully treat both her sinuses and her bronchitis.

## 2020-02-23 NOTE — Telephone Encounter (Signed)
Message forwarded to Hollace Kinnier L, DO   Z-pak is not on current medication list

## 2020-02-29 NOTE — Progress Notes (Signed)
NEUROLOGY FOLLOW UP OFFICE NOTE  Kathy Velez [id number]   Subjective:  Kathy Velez is an 24 year oldright-handed whitefemale with paroxysmal atrial fibrillation on Eliquis, HTN, hypothyroidism, interstitial lung disease, rheumatoid arthritis and history of stroke and colon cancer who follows up for stroke.  She is accompanied by her daughter who supplements history.  Hospital records reviewed.  UPDATE: Current medications: ASA 81mg ; Eliquis; digoxin; atorvastatin 20mg ; Toprol XL; valsartan  Since her stroke in July, she has had worsening short-term memory problems.  Sometimes she may be briefly confused.  She may still have some word-finding issues.  Processing speed is slower.  She is no longer driving.  She is in independent living in [place].  They have transportation available to take her shopping.  She participates in exercise classes.  Her daughter sets up her pillbox for her.  Her balance is still an issue.  After her stroke, she had handle bars installed in the shower and at the toilet.  Sometimes she forgets to use her walker, which is a concern when she is by herself.   HISTORY: On 04/20/2019, she presented to [facility] ED afterhaving word-finding difficulties and making paraphasic errors lasting a couple of hours. She was slurring her words and wasn't able to answer basic questions. No associated facial droop, numbness or weakness. She has some baseline diplopia but without change. She had some cough but no fever or shortness of breath. Labs were unremarkable, including CBC, CMP, TSH (2.309) and testing for flu and Covid. CXR showed no active pulmonary disease. EKG showed known atrial fibrillation but no acute ischemic cardiac event. CT head without contrast showed chronic small vessel disease but no acute findings. MRI of brain without contrast showed 2 punctate subacute infarcts in the left corona radiata and occipital pole subcortical white matter. Otherwise  chronic small vessel ischemic changes with few scattered chronic microhemorrhages in the left occipital pole and central pons and small remote lacunar infarcts in right cerebellum. No change was made to antiplatelet and anticoagulation therapy. She is on ASA and Eliquis. Several months prior, she had a severe left sided headache and noted vision loss on the left side of her vision while watching TV. It lasted 2 hours.  She had another stroke while in the [facility], in which she had confusion, language difficulty and transient right facial droop.  She was transferred to [facility] on 09/27/2019.  MRI of brain showed an acute infarct in the left frontal corona radiata.  Again demonstrated multiple areas of remote microhemorrhages in the peripheral cerebral hemispheres as well as pons, suggesting hypertensive hemorrhage with underlying cerebral amyloid angiopathy.  CTA head and neck showed no large vessel intracranial occlusion or significant stenosis but did demonstrate atherosclerotic plaque in both ICAs causing less than 50% stenosis on the right and 50% stenosis on the left.  Echocardiogram was negative for thrombus.  LDL 46.  HGB A1c 5.1.  Telemetry demonstrated atrial fibrillation.  She is now at [place].  She has ongoing short-term memory problems.  She reports prior history of TIA with right sided weakness.  Previous studies: 11/27/2018 ECHOCARDIOGRAM: EF 60-65% with no cardiac source of emboli 02/03/2019 LABS: LDL 58; B12 1,559 03/06/2019 CAROTID DOPPLER: Moderate heterogeneous and calcified plaque with no hemodynamically significant stenosis  PAST MEDICAL HISTORY: Past Medical History:  Diagnosis Date  . Anemia   . Anxiety   . Arthritis    Back  . Atrial fibrillation ([place])   .  Cancer [facility]) 2006   Colon.  Basal Cell Skin cancer- right arm  . Colon cancer ([place])   . Constipation due to pain medication therapy    after heart surgery  . Dysrhythmia     PAF  . GERD (gastroesophageal reflux disease)   . Heart murmur   . Hypertension   . Hypothyroidism   . RA (rheumatoid arthritis) ([place])   . Restless leg   . Seizures ([place])    after Heart Surgey  . Stroke [facility])    TIA- found by neurologist after     MEDICATIONS: Current Outpatient Medications on File Prior to Visit  Medication Sig Dispense Refill  . acetaminophen (TYLENOL) 500 MG tablet Take 1,000 mg by mouth 2 (two) times daily as needed for moderate pain.     Marland Kitchen atorvastatin (LIPITOR) 20 MG tablet TAKE 1 TABLET DAILY 90 tablet 1  . Azelastine-Fluticasone 137-50 MCG/ACT SUSP Place 1 spray into the nose daily as needed (allergies). 1 g 3  . azithromycin (ZITHROMAX Z-PAK) 250 MG tablet 2 tabs by mouth today, then one daily thereafter 6 each 0  . digoxin (LANOXIN) 0.125 MG tablet TAKE 1 TABLET DAILY 90 tablet 1  . ELIQUIS 2.5 MG TABS tablet TAKE 1 TABLET TWICE A DAY 180 tablet 3  . ENBREL SURECLICK 50 MG/ML injection     . leflunomide (ARAVA) 20 MG tablet Take 20 mg by mouth daily.    . metoprolol succinate (TOPROL-XL) 50 MG 24 hr tablet TAKE 1 TABLET DAILY 90 tablet 1  . mirtazapine (REMERON) 15 MG tablet Take 1 tablet (15 mg total) by mouth at bedtime. 90 tablet 1  . Multiple Vitamins-Minerals (PRESERVISION AREDS 2) CAPS Take 1 Dose by mouth 2 (two) times daily. For eye health    . omeprazole (PRILOSEC) 40 MG capsule Take 1 capsule (40 mg total) by mouth daily. 90 capsule 3  . polycarbophil (FIBERCON) 625 MG tablet Take 1 tablet (625 mg total) by mouth daily. 30 tablet 3  . SYNTHROID 75 MCG tablet TAKE 1 TABLET DAILY BEFORE BREAKFAST FOR HYPOTHYROIDISM 90 tablet 3  . valsartan (DIOVAN) 80 MG tablet Take 1 tablet (80 mg total) by mouth daily. 90 tablet 1  . vitamin B-12 (CYANOCOBALAMIN) 1000 MCG tablet Take 1,000 mcg by mouth daily.      No current facility-administered medications on file prior to visit.    ALLERGIES: Allergies  Allergen Reactions  . Codeine Other (See  Comments)    "just don't take it well"  . Molds & Smuts   . Pollen Extract Swelling  . Bee Venom Swelling and Rash    FAMILY HISTORY: Family History  Problem Relation Age of Onset  . Lung disease Mother 47  . Alcohol abuse Son   . Heart disease Father 16   SOCIAL HISTORY: Social History   Socioeconomic History  . Marital status: Widowed    Spouse name: Not on file  . Number of children: Not on file  . Years of education: Not on file  . Highest education level: Not on file  Occupational History  . Occupation: Automotive engineer    Comment: retired   Tobacco Use  . Smoking status: Never Smoker  . Smokeless tobacco: Never Used  Vaping Use  . Vaping Use: Never used  Substance and Sexual Activity  . Alcohol use: Yes    Alcohol/week: 1.0 standard drink    Types: 1 Glasses of wine per week    Comment: 1-2 per week  .  Drug use: No  . Sexual activity: Not Currently  Other Topics Concern  . Not on file  Social History Narrative   Social History      Diet? Healthy- low salt, sugar, fat      Do you drink/eat things with caffeine? On occasion      Marital status?        widow                        What year were you married? 1958      Do you live in a house, apartment, assisted living, condo, trailer, etc.? apartment      Is it one or more stories? one      How many persons live in your home? one      Do you have any pets in your home? (please list) no      Highest level of education completed? 4 year college      Current or past profession: Statistician      Do you exercise?             yes                         Type & how often? Classes- senior retirement community/ some walking      Advanced Directives      Do you have a living will?      Do you have a DNR form?                                  If not, do you want to discuss one?      Do you have signed POA/HPOA for forms?       Functional Status Completed by: Daughter, Di Kindle       Do you have difficulty bathing or dressing yourself? no      Do you have difficulty preparing food or eating? no      Do you have difficulty managing your medications? no      Do you have difficulty managing your finances? no      Do you have difficulty affording your medications? no   Right handed   Social Determinants of Health   Financial Resource Strain:   . Difficulty of Paying Living Expenses: Not on file  Food Insecurity:   . Worried About Charity fundraiser in the Last Year: Not on file  . Ran Out of Food in the Last Year: Not on file  Transportation Needs:   . Lack of Transportation (Medical): Not on file  . Lack of Transportation (Non-Medical): Not on file  Physical Activity:   . Days of Exercise per Week: Not on file  . Minutes of Exercise per Session: Not on file  Stress:   . Feeling of Stress : Not on file  Social Connections:   . Frequency of Communication with Friends and Family: Not on file  . Frequency of Social Gatherings with Friends and Family: Not on file  . Attends Religious Services: Not on file  . Active Member of Clubs or Organizations: Not on file  . Attends Archivist Meetings: Not on file  . Marital Status: Not on file  Intimate Partner Violence:   . Fear of Current or Ex-Partner: Not on file  . Emotionally Abused: Not on file  .  Physically Abused: Not on file  . Sexually Abused: Not on file     Objective:  Blood pressure (!) 153/87, pulse 88, resp. rate 20, height 5\' 2"  (1.575 m), weight 123 lb (55.8 kg), SpO2 95 %. General: No acute distress.  Patient appears well-groomed.   Head:  Normocephalic/atraumatic Eyes:  Fundi examined but not visualized Neck: supple, no paraspinal tenderness, full range of motion Heart:  Regular rate and rhythm Lungs:  Clear to auscultation bilaterally Back: No paraspinal tenderness Neurological Exam: [employer] Mental Exam 03/01/2020  Weekday Correct 1  Current year 1  What state are  we in? 1  Amount spent 1  Amount left 2  # of Animals 2  5 objects recall 2  Number series 1  Hour markers 2  Time correct 0  Placed X in triangle correctly 1  Largest Figure 1  Name of female 2  Date back to work 2  Type of work 2  State she lived in 0  Total score 21    Assessment/Plan:   1.  Mild major neurocognitive disorder secondary to cerebrovascular dise 2.  Left frontal lobe infarct, likely cardioembolic 3.  Atrial fibrillation 4.  Possible cerebral amyloid angiopathy 5.  Hypertension 6.  Hyperlipidemia  1.  Start donepezil 5mg  (which may be helpful in vascular dementia) at bedtime for one month, followed by 10mg  at bedtime 2.  Secondary stroke prevention as managed by PCP and cardiology -  Eliquis.  Off ASA due to possible CCA -  Statin therapy.  LDL goal less than 70 -  Blood pressure control -  Glycemic control. Hgb A1c goal less than 7 3.  Advised to use walker for ambulation 4.  Continue socialization and participating in exercise classes 5.  Follow up 6 months.    Metta Clines, DO  CC: Hollace Kinnier, MD

## 2020-03-01 ENCOUNTER — Encounter: Payer: Self-pay | Admitting: Neurology

## 2020-03-01 ENCOUNTER — Ambulatory Visit (INDEPENDENT_AMBULATORY_CARE_PROVIDER_SITE_OTHER): Payer: Medicare Other | Admitting: Neurology

## 2020-03-01 ENCOUNTER — Other Ambulatory Visit: Payer: Self-pay

## 2020-03-01 ENCOUNTER — Other Ambulatory Visit: Payer: Self-pay | Admitting: *Deleted

## 2020-03-01 VITALS — BP 153/87 | HR 88 | Resp 20 | Ht 62.0 in | Wt 123.0 lb

## 2020-03-01 DIAGNOSIS — E782 Mixed hyperlipidemia: Secondary | ICD-10-CM

## 2020-03-01 DIAGNOSIS — F5101 Primary insomnia: Secondary | ICD-10-CM

## 2020-03-01 DIAGNOSIS — I63412 Cerebral infarction due to embolism of left middle cerebral artery: Secondary | ICD-10-CM

## 2020-03-01 DIAGNOSIS — I10 Essential (primary) hypertension: Secondary | ICD-10-CM

## 2020-03-01 DIAGNOSIS — I4821 Permanent atrial fibrillation: Secondary | ICD-10-CM | POA: Diagnosis not present

## 2020-03-01 DIAGNOSIS — F01A0 Vascular dementia, mild, without behavioral disturbance, psychotic disturbance, mood disturbance, and anxiety: Secondary | ICD-10-CM

## 2020-03-01 DIAGNOSIS — F0150 Vascular dementia without behavioral disturbance: Secondary | ICD-10-CM | POA: Diagnosis not present

## 2020-03-01 DIAGNOSIS — I680 Cerebral amyloid angiopathy: Secondary | ICD-10-CM

## 2020-03-01 MED ORDER — MIRTAZAPINE 15 MG PO TABS: 15.0000 mg | ORAL_TABLET | Freq: Every day | ORAL | 1 refills | Status: DC

## 2020-03-01 MED ORDER — DONEPEZIL HCL 5 MG PO TABS: 5.0000 mg | ORAL_TABLET | Freq: Every day | ORAL | 0 refills | Status: DC

## 2020-03-01 NOTE — Telephone Encounter (Signed)
[facility] Requested refill.  °

## 2020-03-01 NOTE — Patient Instructions (Addendum)
1.  We will start donepezil (Aricept) 5mg  daily for four weeks.  If you are tolerating the medication, then after four weeks, we will increase the dose to 10mg  daily.  Side effects include nausea, vomiting, diarrhea, vivid dreams, and muscle cramps.  Please call the clinic if you experience any of these symptoms. 2.  Continue Eliquis 3. Continue atorvastatin 4.  Continue blood pressure medication 5.  Remember to use your walker at all times 6.  Follow up in 6 months.

## 2020-03-18 ENCOUNTER — Other Ambulatory Visit: Payer: Self-pay | Admitting: Internal Medicine

## 2020-03-21 NOTE — Telephone Encounter (Signed)
rx sent to pharmacy by e-script  

## 2020-03-24 ENCOUNTER — Telehealth: Payer: Self-pay

## 2020-03-24 NOTE — Telephone Encounter (Signed)
Yes, cbc, cmp, BNP for shortness of breath and fatigue.

## 2020-03-24 NOTE — Telephone Encounter (Signed)
Kathy Velez made an appointment with you on next Wednesday 03/30/2020 and Herbert Seta would like to know if you would like labs drawn because of her having SOB and Fatigue.She stated maybe a CMP and CBC... please advise

## 2020-03-24 NOTE — Telephone Encounter (Signed)
Herbert Seta called and stated patient was having SOB and fatigue, but she wanted to be seen in the clinic on Wednesday, 1/5. I did schedule her for the only opening you had, but she is asking if you want labs before that visit. It looks like last labs are from April.

## 2020-03-28 NOTE — Telephone Encounter (Signed)
Kathy Velez at [facility] added labs for 03/29/2020

## 2020-03-29 ENCOUNTER — Emergency Department (HOSPITAL_COMMUNITY)
Admission: EM | Admit: 2020-03-29 | Discharge: 2020-03-30 | Disposition: A | Discharge status: Home / Self Care | Payer: Medicare Other | Attending: Emergency Medicine | Admitting: Emergency Medicine

## 2020-03-29 ENCOUNTER — Other Ambulatory Visit: Payer: Self-pay

## 2020-03-29 ENCOUNTER — Emergency Department (HOSPITAL_COMMUNITY): Payer: Medicare Other

## 2020-03-29 DIAGNOSIS — E538 Deficiency of other specified B group vitamins: Secondary | ICD-10-CM | POA: Diagnosis not present

## 2020-03-29 DIAGNOSIS — R000 Tachycardia, unspecified: Secondary | ICD-10-CM | POA: Diagnosis not present

## 2020-03-29 DIAGNOSIS — D509 Iron deficiency anemia, unspecified: Secondary | ICD-10-CM | POA: Diagnosis not present

## 2020-03-29 DIAGNOSIS — I10 Essential (primary) hypertension: Secondary | ICD-10-CM | POA: Insufficient documentation

## 2020-03-29 DIAGNOSIS — Z85038 Personal history of other malignant neoplasm of large intestine: Secondary | ICD-10-CM | POA: Insufficient documentation

## 2020-03-29 DIAGNOSIS — Z7901 Long term (current) use of anticoagulants: Secondary | ICD-10-CM | POA: Insufficient documentation

## 2020-03-29 DIAGNOSIS — I4891 Unspecified atrial fibrillation: Secondary | ICD-10-CM | POA: Diagnosis not present

## 2020-03-29 DIAGNOSIS — D7589 Other specified diseases of blood and blood-forming organs: Secondary | ICD-10-CM | POA: Diagnosis not present

## 2020-03-29 DIAGNOSIS — R202 Paresthesia of skin: Secondary | ICD-10-CM

## 2020-03-29 DIAGNOSIS — Z85828 Personal history of other malignant neoplasm of skin: Secondary | ICD-10-CM | POA: Diagnosis not present

## 2020-03-29 DIAGNOSIS — R4701 Aphasia: Secondary | ICD-10-CM | POA: Insufficient documentation

## 2020-03-29 DIAGNOSIS — G459 Transient cerebral ischemic attack, unspecified: Secondary | ICD-10-CM

## 2020-03-29 DIAGNOSIS — Z79899 Other long term (current) drug therapy: Secondary | ICD-10-CM | POA: Diagnosis not present

## 2020-03-29 DIAGNOSIS — R29818 Other symptoms and signs involving the nervous system: Secondary | ICD-10-CM | POA: Diagnosis not present

## 2020-03-29 DIAGNOSIS — E782 Mixed hyperlipidemia: Secondary | ICD-10-CM | POA: Diagnosis not present

## 2020-03-29 DIAGNOSIS — E039 Hypothyroidism, unspecified: Secondary | ICD-10-CM | POA: Diagnosis not present

## 2020-03-29 LAB — COMPREHENSIVE METABOLIC PANEL
ALT: 23 U/L (ref 0–44)
AST: 24 U/L (ref 15–41)
Albumin: 3.9 g/dL (ref 3.5–5.0)
Albumin: 4.5 (ref 3.5–5.0)
Alkaline Phosphatase: 72 U/L (ref 38–126)
Anion gap: 10 (ref 5–15)
BUN: 18 mg/dL (ref 8–23)
CO2: 26 mmol/L (ref 22–32)
Calcium: 9 mg/dL (ref 8.9–10.3)
Calcium: 9.6 (ref 8.7–10.7)
Chloride: 102 mmol/L (ref 98–111)
Creatinine, Ser: 0.93 mg/dL (ref 0.44–1.00)
GFR, Estimated: 60 mL/min — ABNORMAL LOW (ref >60)
Globulin: 2.3
Glucose, Bld: 107 mg/dL — ABNORMAL HIGH (ref 70–99)
Potassium: 4.2 mmol/L (ref 3.5–5.1)
Sodium: 138 mmol/L (ref 135–145)
Total Bilirubin: 0.8 mg/dL (ref 0.3–1.2)
Total Protein: 6.5 g/dL (ref 6.5–8.1)

## 2020-03-29 LAB — CBC
HCT: 32.6 % — ABNORMAL LOW (ref 36.0–46.0)
Hemoglobin: 10.9 g/dL — ABNORMAL LOW (ref 12.0–15.0)
MCH: 35.4 pg — ABNORMAL HIGH (ref 26.0–34.0)
MCHC: 33.4 g/dL (ref 30.0–36.0)
MCV: 105.8 fL — ABNORMAL HIGH (ref 80.0–100.0)
Platelets: 169 10*3/uL (ref 150–400)
RBC: 3.08 MIL/uL — ABNORMAL LOW (ref 3.87–5.11)
RBC: 3.27 — AB (ref 3.87–5.11)
RDW: 15.7 % — ABNORMAL HIGH (ref 11.5–15.5)
WBC: 6.9 10*3/uL (ref 4.0–10.5)
nRBC: 0 % (ref 0.0–0.2)

## 2020-03-29 LAB — I-STAT CHEM 8, ED
BUN: 23 mg/dL (ref 8–23)
Calcium, Ion: 0.68 mmol/L — CL (ref 1.15–1.40)
Chloride: 104 mmol/L (ref 98–111)
Creatinine, Ser: 0.9 mg/dL (ref 0.44–1.00)
Glucose, Bld: 103 mg/dL — ABNORMAL HIGH (ref 70–99)
HCT: 35 % — ABNORMAL LOW (ref 36.0–46.0)
Hemoglobin: 11.9 g/dL — ABNORMAL LOW (ref 12.0–15.0)
Potassium: 4.4 mmol/L (ref 3.5–5.1)
Sodium: 137 mmol/L (ref 135–145)
TCO2: 27 mmol/L (ref 22–32)

## 2020-03-29 LAB — DIFFERENTIAL
Abs Immature Granulocytes: 0.02 10*3/uL (ref 0.00–0.07)
Basophils Absolute: 0.1 10*3/uL (ref 0.0–0.1)
Basophils Relative: 1 %
Eosinophils Absolute: 0.1 10*3/uL (ref 0.0–0.5)
Eosinophils Relative: 1 %
Immature Granulocytes: 0 %
Lymphocytes Relative: 39 %
Lymphs Abs: 2.7 10*3/uL (ref 0.7–4.0)
Monocytes Absolute: 0.9 10*3/uL (ref 0.1–1.0)
Monocytes Relative: 14 %
Neutro Abs: 3.1 10*3/uL (ref 1.7–7.7)
Neutrophils Relative %: 45 %

## 2020-03-29 LAB — BASIC METABOLIC PANEL
BUN: 15 (ref 4–21)
CO2: 25 — AB (ref 13–22)
Chloride: 103 (ref 99–108)
Creatinine: 0.8 (ref 0.5–1.1)
Glucose: 111
Potassium: 5.2 (ref 3.4–5.3)
Sodium: 140 (ref 137–147)

## 2020-03-29 LAB — CBC AND DIFFERENTIAL
HCT: 33 — AB (ref 36–46)
Hemoglobin: 11.3 — AB (ref 12.0–16.0)
Platelets: 173 (ref 150–399)
WBC: 5.5

## 2020-03-29 LAB — PROTIME-INR
INR: 1.1 (ref 0.8–1.2)
Prothrombin Time: 14 seconds (ref 11.4–15.2)

## 2020-03-29 LAB — APTT: aPTT: 31 seconds (ref 24–36)

## 2020-03-29 MED ORDER — SODIUM CHLORIDE 0.9% FLUSH: 3.0000 mL | Freq: Once | INTRAVENOUS | Status: DC

## 2020-03-29 NOTE — ED Notes (Addendum)
Family member approached staff, stated that she does not understand why family has not been seen yet. Staff explained reasoning behind wait times, acuity, etc. States that she will wait "a bit longer" & take pt home if pt not called back.

## 2020-03-29 NOTE — ED Triage Notes (Signed)
Patient arrived via EMS; from [place] [facility]. Reported sudden onset of facial droop and right arm numbness started at 1357 this afternoon, resolved now. Hx of stroke and is on eliquis.

## 2020-03-30 ENCOUNTER — Encounter: Payer: Self-pay | Admitting: Internal Medicine

## 2020-03-30 ENCOUNTER — Non-Acute Institutional Stay: Payer: Medicare Other | Admitting: Internal Medicine

## 2020-03-30 VITALS — BP 140/84 | HR 75 | Temp 97.9°F | Ht 62.0 in | Wt 121.0 lb

## 2020-03-30 DIAGNOSIS — M06049 Rheumatoid arthritis without rheumatoid factor, unspecified hand: Secondary | ICD-10-CM | POA: Diagnosis not present

## 2020-03-30 DIAGNOSIS — D649 Anemia, unspecified: Secondary | ICD-10-CM | POA: Diagnosis not present

## 2020-03-30 DIAGNOSIS — R0602 Shortness of breath: Secondary | ICD-10-CM

## 2020-03-30 DIAGNOSIS — J209 Acute bronchitis, unspecified: Secondary | ICD-10-CM | POA: Diagnosis not present

## 2020-03-30 DIAGNOSIS — J440 Chronic obstructive pulmonary disease with acute lower respiratory infection: Secondary | ICD-10-CM | POA: Diagnosis not present

## 2020-03-30 DIAGNOSIS — G459 Transient cerebral ischemic attack, unspecified: Secondary | ICD-10-CM | POA: Diagnosis not present

## 2020-03-30 NOTE — Progress Notes (Signed)
Location:  Occupational psychologist of Service:  Clinic (12)  Provider: Maudie Shingledecker L. Mariea Clonts, D.O., C.M.D.  Code Status: DNR Goals of Care:  Advanced Directives 03/30/2020  Does Patient Have a Medical Advance Directive? Yes  Type of Paramedic of [place];Out of facility DNR (pink MOST or yellow form)  Does patient want to make changes to medical advance directive? No - Patient declined  Copy of [place] in Chart? Yes - validated most recent copy scanned in chart (See row information)  Would patient like information on creating a medical advance directive? -  Pre-existing out of facility DNR order (yellow form or pink MOST form) -     Chief Complaint  Patient presents with  . Acute Visit    SOB, and Fatigue patient does not feel well. Follow up from hospital     HPI: Patient is a 87 y.o. female seen today acute visit for shortness of breath and fatigue, not feeling well.  She was just in the ED with another TIA.  She is to f/u with Dr. Tomi Likens on this.  There is little more that can be done that is reasonable at her age and with her many comorbidities.  She felt like she never totally recovered after her bronchitis after thanksgiving.  Infection cleared, but exhausted and has had residual gunk.    Had some anemia.  Glucose was 111 fasting yesterday morning.  Has had extra sweets over the holidays.    Her sleep remains poor at times.    Albumin is in normal range.    She is going to flex and flow class and using nustep for a mile.  So doing 1.5 hrs of exercise.  Not sob more on nustep, but more sob walking around with her daughter--stops to catch breath.  Past Medical History:  Diagnosis Date  . Anemia   . Anxiety   . Arthritis    Back  . Atrial fibrillation ([place])   . Cancer [facility]) 2006   Colon.  Basal Cell Skin cancer- right arm  . Colon cancer ([place])   . Constipation due to pain medication therapy    after heart  surgery  . Dysrhythmia    PAF  . GERD (gastroesophageal reflux disease)   . Heart murmur   . Hypertension   . Hypothyroidism   . RA (rheumatoid arthritis) ([place])   . Restless leg   . Seizures ([place])    after Heart Surgey  . Stroke [facility])    TIA- found by neurologist after     Past Surgical History:  Procedure Laterality Date  . ABDOMINAL HYSTERECTOMY  1970   Partial   . COLON RESECTION  2006   cancer  . COLONOSCOPY    . EYE SURGERY Bilateral    Cataract  . MAXIMUM ACCESS (MAS)POSTERIOR LUMBAR INTERBODY FUSION (PLIF) 2 LEVEL N/A 04/12/2015   Procedure: Lumbar Three-Five Decompression, Pedicle Screw Fixation, and Posteriolateral Arthrodesis;  Surgeon: Erline Levine, MD;  Location: [place] [facility]UR[facility] ORS;  Service: Neurosurgery;  Laterality: N/A;  L3-4 L4-5 Maximum access posterior lumbar fusion, possible interbodies and resection of synovial cyst at L4-5  . MITRAL VALVE REPAIR  01/20/13   Gore-tex cords to P1, P2, and P3. Magic suture to posterior medial commisure, #30 Physio 1 ring. Done in [place]  . TONSILLECTOMY     about 1940  . TRICUSPID VALVE SURGERY  01/20/13   #28 TriAd ring done in [place]    Allergies  Allergen Reactions  . Codeine Other (See Comments)    "just don't take it well"  . Molds & Smuts   . Pollen Extract Swelling  . Bee Venom Swelling and Rash    Outpatient Encounter Medications as of 03/30/2020  Medication Sig  . acetaminophen (TYLENOL) 500 MG tablet Take 1,000 mg by mouth 2 (two) times daily as needed for moderate pain.   Marland Kitchen atorvastatin (LIPITOR) 20 MG tablet TAKE 1 TABLET DAILY  . digoxin (LANOXIN) 0.125 MG tablet TAKE 1 TABLET DAILY  . donepezil (ARICEPT) 5 MG tablet Take 1 tablet (5 mg total) by mouth at bedtime.  Marland Kitchen ELIQUIS 2.5 MG TABS tablet TAKE 1 TABLET TWICE A DAY  . ENBREL SURECLICK 50 MG/ML injection   . leflunomide (ARAVA) 20 MG tablet Take 20 mg by mouth daily.  . metoprolol succinate (TOPROL-XL) 50 MG 24 hr tablet TAKE 1 TABLET DAILY  .  mirtazapine (REMERON) 15 MG tablet Take 1 tablet (15 mg total) by mouth at bedtime.  . Multiple Vitamins-Minerals (PRESERVISION AREDS 2) CAPS Take 1 Dose by mouth 2 (two) times daily. For eye health  . omeprazole (PRILOSEC) 40 MG capsule Take 1 capsule (40 mg total) by mouth daily.  . polycarbophil (FIBERCON) 625 MG tablet Take 1 tablet (625 mg total) by mouth daily.  Marland Kitchen SYNTHROID 75 MCG tablet TAKE 1 TABLET DAILY BEFORE BREAKFAST FOR HYPOTHYROIDISM  . valsartan (DIOVAN) 80 MG tablet TAKE 1 TABLET DAILY  . vitamin B-12 (CYANOCOBALAMIN) 1000 MCG tablet Take 1,000 mcg by mouth daily.   . [DISCONTINUED] Azelastine-Fluticasone 137-50 MCG/ACT SUSP Place 1 spray into the nose daily as needed (allergies). (Patient not taking: Reported on 03/01/2020)  . [DISCONTINUED] azithromycin (ZITHROMAX Z-PAK) 250 MG tablet 2 tabs by mouth today, then one daily thereafter (Patient not taking: Reported on 03/01/2020)   No facility-administered encounter medications on file as of 03/30/2020.    Review of Systems:  Review of Systems  Constitutional: Positive for malaise/fatigue. Negative for chills and fever.  HENT: Positive for congestion and hearing loss. Negative for sore throat.   Eyes: Negative for blurred vision.  Respiratory: Positive for sputum production and shortness of breath. Negative for cough and wheezing.   Cardiovascular: Negative for chest pain, palpitations and leg swelling.  Gastrointestinal: Negative for abdominal pain, blood in stool, constipation, diarrhea and melena.  Genitourinary: Positive for frequency and urgency. Negative for dysuria.  Musculoskeletal: Positive for joint pain. Negative for falls.  Skin: Negative for itching and rash.  Neurological: Negative for dizziness, loss of consciousness and weakness.  Endo/Heme/Allergies: Bruises/bleeds easily.  Psychiatric/Behavioral: Positive for memory loss. Negative for depression. The patient is not nervous/anxious and does not have insomnia.      Health Maintenance  Topic Date Due  . TETANUS/TDAP  04/16/2027  . INFLUENZA VACCINE  Completed  . DEXA SCAN  Completed  . COVID-19 Vaccine  Completed  . PNA vac Low Risk Adult  Completed    Physical Exam: Vitals:   03/30/20 0957  BP: 140/84  Pulse: 75  Temp: 97.9 F (36.6 C)  TempSrc: Temporal  SpO2: 94%  Weight: 121 lb (54.9 kg)  Height: '5\' 2"'  (1.575 m)   Body mass index is 22.13 kg/m. Physical Exam Vitals reviewed.  Constitutional:      Appearance: Normal appearance.  HENT:     Head: Normocephalic and atraumatic.     Right Ear: External ear normal.     Left Ear: External ear normal.     Mouth/Throat:  Comments: Postnasal drip Eyes:     Conjunctiva/sclera: Conjunctivae normal.  Neck:     Comments: Clearing throat Cardiovascular:     Rate and Rhythm: Rhythm irregular.     Heart sounds: No murmur heard.   Pulmonary:     Effort: Pulmonary effort is normal.     Breath sounds: Normal breath sounds. No wheezing, rhonchi or rales.     Comments: Diminished breath sounds at base Abdominal:     General: Bowel sounds are normal.  Musculoskeletal:        General: Normal range of motion.     Cervical back: Neck supple.     Right lower leg: No edema.     Left lower leg: No edema.  Lymphadenopathy:     Cervical: No cervical adenopathy.  Neurological:     General: No focal deficit present.     Mental Status: She is alert and oriented to person, place, and time.     Cranial Nerves: No cranial nerve deficit.     Motor: No weakness.     Gait: Gait abnormal.     Comments: Word-finding challenges that have progressed over the past 1-2 yrs  Psychiatric:        Mood and Affect: Mood normal.     Labs reviewed: Basic Metabolic Panel: Recent Labs    04/20/19 1156 07/14/19 0000 07/14/19 0600 03/29/20 0000 03/29/20 1525 03/29/20 1537  NA 137   < >  --  140 138 137  K 4.5   < >  --  5.2 4.2 4.4  CL 100   < >  --  103 102 104  CO2 26  --   --  25* 26  --    GLUCOSE 110*  --   --   --  107* 103*  BUN 15   < >  --  '15 18 23  ' CREATININE 0.93   < >  --  0.8 0.93 0.90  CALCIUM 9.0  --  9.1 9.6 9.0  --   TSH 2.309  --   --   --   --   --    < > = values in this interval not displayed.   Liver Function Tests: Recent Labs    04/20/19 1156 03/29/20 0000 03/29/20 1525  AST 27  --  24  ALT 23  --  23  ALKPHOS 71  --  72  BILITOT 1.0  --  0.8  PROT 6.9  --  6.5  ALBUMIN 4.3 4.5 3.9   No results for input(s): LIPASE, AMYLASE in the last 8760 hours. No results for input(s): AMMONIA in the last 8760 hours. CBC: Recent Labs    04/20/19 1156 07/14/19 0600 03/29/20 0000 03/29/20 1525 03/29/20 1537  WBC 5.9 5.3 5.5 6.9  --   NEUTROABS 2.9  --   --  3.1  --   HGB 12.3 10.8* 11.3* 10.9* 11.9*  HCT 36.8 32* 33* 32.6* 35.0*  MCV 104.8*  --   --  105.8*  --   PLT 163 158 173 169  --    Lipid Panel: No results for input(s): CHOL, HDL, LDLCALC, TRIG, CHOLHDL, LDLDIRECT in the last 8760 hours. Lab Results  Component Value Date   HGBA1C 5.5 01/12/2013    Procedures since last visit: CT HEAD WO CONTRAST  Result Date: 03/29/2020 CLINICAL DATA:  87 year old female with acute neurologic deficit. Concern for stroke. EXAM: CT HEAD WITHOUT CONTRAST TECHNIQUE: Contiguous axial images were obtained from the base  of the skull through the vertex without intravenous contrast. COMPARISON:  Head CT dated 06/19/2019. FINDINGS: Brain: Mild age-related atrophy and chronic microvascular ischemic changes. There is no acute intracranial hemorrhage. No mass effect or midline shift. No extra-axial fluid collection. Vascular: No hyperdense vessel or unexpected calcification. Skull: Normal. Negative for fracture or focal lesion. Sinuses/Orbits: No acute finding. Other: Degenerative changes of the left TMJ. IMPRESSION: 1. No acute intracranial pathology. 2. Mild age-related atrophy and chronic microvascular ischemic changes. Electronically Signed   By: Anner Crete M.D.    On: 03/29/2020 15:57    Assessment/Plan 1. Shortness of breath - r/o pneumonia with CXR -has baseline copd/asthma and never fully regained her pep after initial bronchitis right around when she came to [facility] -she also has had afib and chf related to it  2. Acute bronchitis with COPD ([place]) - DG Chest 2 View -if negative for pneumonia, will tx with a 10 day course of doxy   3. TIA (transient ischemic attack) -already on statin with LDL goal <70, eliquis appropriately dosed for her age and renal function and weight, bp controlled, not diabetic -her daughter spoke with neurology during the admission and they felt that if she has similar presentation of only numbness of the arm and does not have weakness, speech changes, progression from that over the first few minutes and symptoms improve and resolve, they'd like to avoid readmission  -explained that she also would not qualify for TPA regardless due to her eliquis therapy even if she did arrive within the window and met the other criteria -any other treatments for acute stroke would be invasive aggressive ones if she had a blocked vessel and she would not want to pursue those -Hosie Spangle would like to meet with the clinic nurses to make sure this plan is clearly in her record   4. Rheumatoid arthritis involving hand with negative rheumatoid factor, unspecified laterality ([place]) -cont enbrel, arava per rheum -puts her at high risk for covid  5. Anemia, unspecified type -mild, likely chronic disease related from her RA -check iron panel with ferritin and B12 level  Labs/tests ordered:  iron panel with ferritin, b12 Next appt:  06/08/2020  Yuna Pizzolato L.  [place], D.O. [facility] [address] [place],  [address] Cell Phone (Mon-Fri 8am-5pm):  [phone] On Call:  [phone] & follow prompts after 5pm & weekends Office Phone:  [phone] Office Fax:  [phone]

## 2020-03-30 NOTE — ED Notes (Signed)
Pt daughter left DNR form, daughter states that she will return tomorrow to get it

## 2020-03-30 NOTE — Discharge Instructions (Signed)
You were seen today for paresthesias.  They fully resolved.  This may have been a TIA.  You need to continue your Eliquis as scheduled.  Follow-up with Dr. Everlena Cooper.  If you develop significant strokelike symptoms such as speech difficulty, weakness, facial droop, you should be reevaluated immediately.

## 2020-03-30 NOTE — ED Provider Notes (Signed)
[facility] EMERGENCY DEPARTMENT Provider Note   CSN: [id number] Arrival date & time: 03/29/20  1514     History Chief Complaint  Patient presents with  . Facial Droop  . Numbness    Kathy Velez is a 87 y.o. female.  HPI     This is an 87 year old female with a history of atrial fibrillation on Eliquis, stroke, TIA, hypertension who presents with numbness and tingling of the right hand and face.  Patient reports onset of symptoms while she was walking out to her mailbox yesterday afternoon.  She had tingling in the right hand and noted tingling of the right side of the face and tongue.  No facial droop was noted.  She states that symptoms lasted 20 minutes and self resolved.  She is not had any further symptoms.  She was evaluated by her nurse at [facility] who advised her to be evaluated.  Patient has recent history of breakthrough stroke while on Eliquis.  She had episode of aphasia and weakness both in January and July.  She had MRIs at that time that documented acute strokes.  Both times she was on Eliquis.  She has followed up with Dr. Tomi Likens.  She continues to take Eliquis.  She reports compliance with her medication.  She is not had any recent illnesses, fevers, cough.   Past Medical History:  Diagnosis Date  . Anemia   . Anxiety   . Arthritis    Back  . Atrial fibrillation ([place])   . Cancer [facility]) 2006   Colon.  Basal Cell Skin cancer- right arm  . Colon cancer ([place])   . Constipation due to pain medication therapy    after heart surgery  . Dysrhythmia    PAF  . GERD (gastroesophageal reflux disease)   . Heart murmur   . Hypertension   . Hypothyroidism   . RA (rheumatoid arthritis) ([place])   . Restless leg   . Seizures ([place])    after Heart Surgey  . Stroke [facility])    TIA- found by neurologist after     Patient Active Problem List   Diagnosis Date Noted  . Psychophysiological insomnia 10/11/2019  . DNR (do not resuscitate) 10/11/2019  . Transient  ischemic attack (TIA) 03/09/2019  . Pain in right knee 02/12/2018  . Osteopenia of forearm 12/11/2017  . Primary osteoarthritis of both knees 12/11/2017  . Permanent atrial fibrillation ([place]) 11/22/2017  . Hypercoagulable state due to atrial fibrillation ([place]) 10/11/2017  . Chronic atrial fibrillation ([place]) 10/11/2017  . Diplopia 10/11/2017  . Presbycusis of both ears 10/11/2017  . Acquired hammertoes of both feet 10/11/2017  . Macrocytosis without anemia 10/11/2017  . Genu valgum, right 10/11/2017  . History of multiple strokes 10/11/2017  . Mild cognitive impairment with memory loss 10/11/2017  . Overactive bladder 10/11/2017  . Mixed hyperlipidemia 09/03/2017  . History of colon cancer, stage III 01/17/2017  . Carotid stenosis 12/13/2016  . Chronic congestion of paranasal sinus 11/13/2016  . Chronic seasonal allergic rhinitis 06/06/2016  . Interstitial lung disease ([place]) 12/28/2015  . Hemispheric carotid artery syndrome 07/18/2015  . Hypothyroidism 05/05/2015  . Rheumatoid arthritis ([place]) 05/05/2015  . Chronic constipation 05/05/2015  . Vitamin B12 deficiency 05/05/2015  . Iron deficiency anemia 05/02/2015  . Back pain 05/02/2015  . Elective surgery   . Atrial flutter ([place]) 04/13/2015  . Chest pain at rest 04/13/2015  . Status post mitral valve repair 04/13/2015  . Status post tricuspid valve repair 04/13/2015  .  Spondylolisthesis of lumbar region 04/12/2015  . Spinal stenosis of lumbar region 02/02/2015  . RLS (restless legs syndrome) 11/10/2013  . HTN (hypertension), benign 07/14/2013  . Depression 07/02/2011    Past Surgical History:  Procedure Laterality Date  . ABDOMINAL HYSTERECTOMY  1970   Partial   . COLON RESECTION  2006   cancer  . COLONOSCOPY    . EYE SURGERY Bilateral    Cataract  . MAXIMUM ACCESS (MAS)POSTERIOR LUMBAR INTERBODY FUSION (PLIF) 2 LEVEL N/A 04/12/2015   Procedure: Lumbar Three-Five Decompression, Pedicle Screw Fixation, and Posteriolateral  Arthrodesis;  Surgeon: Erline Levine, MD;  Location: [facility];  Service: Neurosurgery;  Laterality: N/A;  L3-4 L4-5 Maximum access posterior lumbar fusion, possible interbodies and resection of synovial cyst at L4-5  . MITRAL VALVE REPAIR  01/20/13   Gore-tex cords to P1, P2, and P3. Magic suture to posterior medial commisure, #30 Physio 1 ring. Done in [place]  . TONSILLECTOMY     about 1940  . TRICUSPID VALVE SURGERY  01/20/13   #28 TriAd ring done in [place]     OB History   No obstetric history on file.     Family History  Problem Relation Age of Onset  . Lung disease Mother 42  . Alcohol abuse Son   . Heart disease Father 42    Social History   Tobacco Use  . Smoking status: Never Smoker  . Smokeless tobacco: Never Used  Vaping Use  . Vaping Use: Never used  Substance Use Topics  . Alcohol use: Yes    Alcohol/week: 1.0 standard drink    Types: 1 Glasses of wine per week    Comment: 1-2 per week  . Drug use: No    Home Medications Prior to Admission medications   Medication Sig Start Date End Date Taking? Authorizing Provider  acetaminophen (TYLENOL) 500 MG tablet Take 1,000 mg by mouth 2 (two) times daily as needed for moderate pain.     [provider]  atorvastatin (LIPITOR) 20 MG tablet TAKE 1 TABLET DAILY 01/11/20   Reed, Tiffany L, DO  Azelastine-Fluticasone 137-50 MCG/ACT SUSP Place 1 spray into the nose daily as needed (allergies). Patient not taking: Reported on 03/01/2020 08/12/19   Hollace Kinnier L, DO  azithromycin (ZITHROMAX Z-PAK) 250 MG tablet 2 tabs by mouth today, then one daily thereafter Patient not taking: Reported on 03/01/2020 02/23/20   Hollace Kinnier L, DO  digoxin (LANOXIN) 0.125 MG tablet TAKE 1 TABLET DAILY 01/11/20   Reed, Tiffany L, DO  donepezil (ARICEPT) 5 MG tablet Take 1 tablet (5 mg total) by mouth at bedtime. 03/01/20   Pieter Partridge, DO  ELIQUIS 2.5 MG TABS tablet TAKE 1 TABLET TWICE A DAY 11/24/19   Reed, Tiffany L, DO   ENBREL SURECLICK 50 MG/ML injection  03/06/18   [provider]  leflunomide (ARAVA) 20 MG tablet Take 20 mg by mouth daily. 10/05/19   [provider]  metoprolol succinate (TOPROL-XL) 50 MG 24 hr tablet TAKE 1 TABLET DAILY 01/11/20   Reed, Tiffany L, DO  mirtazapine (REMERON) 15 MG tablet Take 1 tablet (15 mg total) by mouth at bedtime. 03/01/20   Reed, Tiffany L, DO  Multiple Vitamins-Minerals (PRESERVISION AREDS 2) CAPS Take 1 Dose by mouth 2 (two) times daily. For eye health    [provider]  omeprazole (PRILOSEC) 40 MG capsule Take 1 capsule (40 mg total) by mouth daily. 08/28/19   Reed, Tiffany L, DO  polycarbophil (  FIBERCON) 625 MG tablet Take 1 tablet (625 mg total) by mouth daily. 08/12/19   Reed, Tiffany L, DO  SYNTHROID 75 MCG tablet TAKE 1 TABLET DAILY BEFORE BREAKFAST FOR HYPOTHYROIDISM 07/27/19   Reed, Tiffany L, DO  valsartan (DIOVAN) 80 MG tablet TAKE 1 TABLET DAILY 03/21/20   Reed, Tiffany L, DO  vitamin B-12 (CYANOCOBALAMIN) 1000 MCG tablet Take 1,000 mcg by mouth daily.     [provider]    Allergies    Codeine, Molds & smuts, Pollen extract, and Bee venom  Review of Systems   Review of Systems  Constitutional: Negative for fever.  Respiratory: Negative for shortness of breath.   Cardiovascular: Negative for chest pain.  Gastrointestinal: Negative for abdominal pain, nausea and vomiting.  Genitourinary: Negative for dysuria.  Neurological: Positive for numbness. Negative for dizziness, facial asymmetry and weakness.  All other systems reviewed and are negative.   Physical Exam Updated Vital Signs BP (!) 184/112 (BP Location: Right Arm)   Pulse 89   Temp 97.7 F (36.5 C) (Oral)   Resp 18   Ht 1.575 m (5\' 2" )   Wt 54.9 kg   SpO2 97%   BMI 22.13 kg/m   Physical Exam Vitals and nursing note reviewed.  Constitutional:      Appearance: She is well-developed and well-nourished. She is not ill-appearing.  HENT:     Head:  Normocephalic and atraumatic.     Nose: Nose normal.     Mouth/Throat:     Mouth: Mucous membranes are moist.  Eyes:     Pupils: Pupils are equal, round, and reactive to light.  Cardiovascular:     Rate and Rhythm: Normal rate and regular rhythm.     Heart sounds: Normal heart sounds.  Pulmonary:     Effort: Pulmonary effort is normal. No respiratory distress.     Breath sounds: No wheezing.  Abdominal:     General: Bowel sounds are normal.     Palpations: Abdomen is soft.     Tenderness: There is no abdominal tenderness.  Musculoskeletal:     Cervical back: Neck supple.     Right lower leg: No edema.     Left lower leg: No edema.  Skin:    General: Skin is warm and dry.  Neurological:     Mental Status: She is alert and oriented to person, place, and time.     Comments: Fluent speech, cranial nerves II through XII intact, 5 out of 5 strength in all 4 extremities, no drift noted, no dysmetria to finger-nose-finger, sensation intact to light touch  Psychiatric:        Mood and Affect: Mood and affect and mood normal.     ED Results / Procedures / Treatments   Labs (all labs ordered are listed, but only abnormal results are displayed) Labs Reviewed  CBC - Abnormal; Notable for the following components:      Result Value   RBC 3.08 (*)    Hemoglobin 10.9 (*)    HCT 32.6 (*)    MCV 105.8 (*)    MCH 35.4 (*)    RDW 15.7 (*)    All other components within normal limits  COMPREHENSIVE METABOLIC PANEL - Abnormal; Notable for the following components:   Glucose, Bld 107 (*)    GFR, Estimated 60 (*)    All other components within normal limits  I-STAT CHEM 8, ED - Abnormal; Notable for the following components:   Glucose, Bld 103 (*)  Calcium, Ion 0.68 (*)    Hemoglobin 11.9 (*)    HCT 35.0 (*)    All other components within normal limits  PROTIME-INR  APTT  DIFFERENTIAL  CBG MONITORING, ED    EKG EKG Interpretation  Date/Time:  Tuesday March 29 2020 15:12:41  EST Ventricular Rate:  95 PR Interval:    QRS Duration: 76 QT Interval:  330 QTC Calculation: 414 R Axis:   -3 Text Interpretation: Atrial fibrillation with premature ventricular or aberrantly conducted complexes Nonspecific ST and T wave abnormality Abnormal ECG Confirmed by Thayer Jew [phone]) on 03/30/2020 1:25:48 AM   Radiology CT HEAD WO CONTRAST  Result Date: 03/29/2020 CLINICAL DATA:  87 year old female with acute neurologic deficit. Concern for stroke. EXAM: CT HEAD WITHOUT CONTRAST TECHNIQUE: Contiguous axial images were obtained from the base of the skull through the vertex without intravenous contrast. COMPARISON:  Head CT dated 06/19/2019. FINDINGS: Brain: Mild age-related atrophy and chronic microvascular ischemic changes. There is no acute intracranial hemorrhage. No mass effect or midline shift. No extra-axial fluid collection. Vascular: No hyperdense vessel or unexpected calcification. Skull: Normal. Negative for fracture or focal lesion. Sinuses/Orbits: No acute finding. Other: Degenerative changes of the left TMJ. IMPRESSION: 1. No acute intracranial pathology. 2. Mild age-related atrophy and chronic microvascular ischemic changes. Electronically Signed   By: Anner Crete M.D.   On: 03/29/2020 15:57    Procedures Procedures (including critical care time)  Medications Ordered in ED Medications  sodium chloride flush (NS) 0.9 % injection 3 mL (has no administration in time range)    ED Course  I have reviewed the triage vital signs and the nursing notes.  Pertinent labs & imaging results that were available during my care of the patient were reviewed by me and considered in my medical decision making (see chart for details).  Clinical Course as of 03/30/20 0230  Wed Mar 30, 2020  0224 Dr. Leonel Ramsay, neurology.  Patient chart reviewed.  Given transient paresthesias, no further work-up indicated.  Could consider CTA head neck although patient has had one recently.   No indication for MRI.  She needs to continue her Eliquis and follow-up with Dr. Tomi Likens. [CH]    Clinical Course User Index [CH] Akil Hoos, Barbette Hair, MD   MDM Rules/Calculators/A&P                          Patient presents with numbness.  Self-limited and lasted approximately 20 minutes.  She has been in the waiting room for over 10 hours on my evaluation.  Vital signs notable for blood pressure 184/112.  She has not taken her nighttime blood pressure medications.  She is currently neurologically intact.  No objective stroke symptoms.  Work-up reviewed from triage.  EKG shows atrial fibrillation that is rate controlled.  CT scan shows no acute intracranial pathology.  Basic lab work without significant metabolic derangements.  No leukocytosis.  She has no infectious symptoms.  Given her history, would have concerned that she had a small TIA.  I discussed the case with Dr. Leonel Ramsay.  He does not believe there would be any utility in MRI.  We discussed possible CTA of the head and neck.  Patient had CTA head and the neck in March.  Given that she is neurologically intact, discussed obtaining CTA with the patient and her daughter.  They would just like to be discharged and follow-up with Dr. Tomi Likens.  Daughter expressed some frustration regarding wait times and  interactions with staff in the waiting room.  I have requested the charge nurse speak with the patient.  She was also provided a number call.  I discussed with him current constraints in the system and apologized for the prolonged wait for evaluation.  They stated understanding.  I message Dr. Tomi Likens regarding her visit.  After history, exam, and medical workup I feel the patient has been appropriately medically screened and is safe for discharge home. Pertinent diagnoses were discussed with the patient. Patient was given return precautions.  Final Clinical Impression(s) / ED Diagnoses Final diagnoses:  TIA (transient ischemic attack)  Paresthesia     Rx / DC Orders ED Discharge Orders    None       Hedwig Mcfall, Barbette Hair, MD 03/30/20 0236

## 2020-03-31 ENCOUNTER — Telehealth: Payer: Self-pay

## 2020-03-31 ENCOUNTER — Other Ambulatory Visit: Payer: Self-pay

## 2020-03-31 ENCOUNTER — Ambulatory Visit
Admission: RE | Admit: 2020-03-31 | Discharge: 2020-03-31 | Disposition: A | Discharge status: Home / Self Care | Payer: Medicare Other | Source: Ambulatory Visit | Attending: Internal Medicine | Admitting: Internal Medicine

## 2020-03-31 DIAGNOSIS — R0602 Shortness of breath: Secondary | ICD-10-CM | POA: Diagnosis not present

## 2020-03-31 DIAGNOSIS — R059 Cough, unspecified: Secondary | ICD-10-CM | POA: Diagnosis not present

## 2020-03-31 MED ORDER — DOXYCYCLINE HYCLATE 100 MG PO TABS: 100.0000 mg | ORAL_TABLET | Freq: Two times a day (BID) | ORAL | 0 refills | Status: AC

## 2020-03-31 NOTE — Telephone Encounter (Signed)
-----   Message from Kermit Balo, DO sent at 03/31/2020 11:50 AM EST ----- Fortunately, no pneumonia.   Let's start doxycycline 100mg  po bid for bronchitis for 10 days--please send to [facility].

## 2020-03-31 NOTE — Progress Notes (Signed)
Fortunately, no pneumonia.   Let's start doxycycline 100mg  po bid for bronchitis for 10 days--please send to [facility]

## 2020-03-31 NOTE — Telephone Encounter (Signed)
Discussed results with patient, patient verbalized understanding of results  RX sent to pharmacy with notation to deliver to patient (per patient request)

## 2020-04-01 NOTE — Progress Notes (Signed)
Virtual Visit via Video Note The purpose of this virtual visit is to provide medical care while limiting exposure to the novel coronavirus.    Consent was obtained for video visit:  Yes.   Answered questions that patient had about telehealth interaction:  Yes.   I discussed the limitations, risks, security and privacy concerns of performing an evaluation and management service by telemedicine. I also discussed with the patient that there may be a patient responsible charge related to this service. The patient expressed understanding and agreed to proceed.  Pt location: Home Physician Location: office Name of referring provider:  Gayland Curry, DO I connected with Kathy Velez at patients initiation/request on 04/04/2020 at  3:30 PM EST by video enabled telemedicine application and verified that I am speaking with the correct person using two identifiers. Pt MRN:  [id number] Pt DOB:  1931/03/28 Video Participants:  Kathy Velez;  Her daughter   History of Present Illness:  Kathy Velez is an 34 year oldright-handed whitefemale with paroxysmal atrial fibrillation on Eliquis, HTN, hypothyroidism, interstitial lung disease, rheumatoid arthritis and history of stroke and colon cancer who follows up for recent TIA.She is accompanied by her daughter who supplements history. ED records reviewed.  UPDATE: Current medications: donepezil 10mg , Eliquis; digoxin; atorvastatin 20mg ; Toprol XL; valsartan  Arm neck jaw - numbness lasted 30 minutes -  Fell yesterday doxycycline - dizzy  On 03/28/2020, she developed sudden onset of right sided numbness and tingling involving her face, tongue and hand lasting about 20 minutes and resolved.  No associated weakness.  The next day, she was was seen by the nurse at [facility] who advised her to go the ED for evaluation.  CT head personally reviewed showed no acute intracranial abnormality.  Due to recent work up previously performed and mild sensory symptoms  that have since resolved, she was discharged in stable condition with no change in management.  She is in independent living in [place].  They have transportation available to take her shopping.  She participates in exercise classes.  Her daughter sets up her pillbox for her.  Her balance is still an issue.  After her stroke, she had handle bars installed in the shower and at the toilet.  Sometimes she forgets to use her walker, which is a concern when she is by herself.     HISTORY: On 04/20/2019, she presented to the [facility] ED afterhaving word-finding difficulties and making paraphasic errors lasting a couple of hours. She was slurring her words and wasn't able to answer basic questions. No associated facial droop, numbness or weakness. She has some baseline diplopia but without change. She had some cough but no fever or shortness of breath. Labs were unremarkable, including CBC, CMP, TSH (2.309) and testing for flu and Covid. CXR showed no active pulmonary disease. EKG showed known atrial fibrillation but no acute ischemic cardiac event. CT head without contrast showed chronic small vessel disease but no acute findings. MRI of brain without contrast showed 2 punctate subacute infarcts in the left corona radiata and occipital pole subcortical white matter. Otherwise chronic small vessel ischemic changes with few scattered chronic microhemorrhages in the left occipital pole and central pons and small remote lacunar infarcts in right cerebellum. No change was made to antiplatelet and anticoagulation therapy. She is on ASA and Eliquis. Several months prior, she had a severe left sided headache and noted vision loss on the left side of her vision while watching TV. It lasted 2 hours.  [facility] she had confusion, language difficulty and transient right facial droop. She was transferred to [facility] on 09/27/2019. MRI of brain showed an acute infarct in the left frontal corona radiata. Again demonstrated multiple areas of remote microhemorrhages in the peripheral cerebral hemispheres as well as pons, suggesting hypertensive hemorrhage with underlying cerebral amyloid angiopathy. CTA head and neck showed no large vessel intracranial occlusion or significant stenosis but did demonstrate atherosclerotic plaque in both ICAs causing less than 50% stenosis on the right and 50% stenosis on the left. Echocardiogram was negative for thrombus. LDL 46. HGB A1c 5.1. Telemetry demonstrated atrial fibrillation. Due to possible cerebral amyloid angiopathy, ASA was discontinued.  Since her stroke in July, she has had ongoing short-term memory problems.  She reports prior history of TIA with right sided weakness.  Previous studies: 11/27/2018 ECHOCARDIOGRAM: EF 60-65% with no cardiac source of emboli 02/03/2019 LABS: LDL 58; B12 1,559 03/06/2019 CAROTID DOPPLER: Moderate heterogeneous and calcified plaque with no hemodynamically significant stenosis   Past Medical History: Past Medical History:  Diagnosis Date  . Anemia   . Anxiety   . Arthritis    Back  . Atrial fibrillation ([place])   . Cancer [facility]) 2006   Colon.  Basal Cell Skin cancer- right arm  . Colon cancer ([place])   . Constipation due to pain medication therapy    after heart surgery  . Dysrhythmia    PAF  . GERD (gastroesophageal reflux disease)   . Heart murmur   . Hypertension   . Hypothyroidism   . RA (rheumatoid arthritis) ([place])   . Restless leg   . Seizures ([place])    after Heart Surgey  . Stroke [facility])    TIA- found by neurologist after     Medications: Outpatient Encounter Medications as of 04/04/2020  Medication Sig  . acetaminophen (TYLENOL) 500 MG tablet Take 1,000 mg by mouth 2 (two) times daily as needed for moderate pain.   Marland Kitchen atorvastatin (LIPITOR) 20 MG tablet TAKE 1 TABLET DAILY  .  digoxin (LANOXIN) 0.125 MG tablet TAKE 1 TABLET DAILY  . donepezil (ARICEPT) 5 MG tablet Take 1 tablet (5 mg total) by mouth at bedtime.  Marland Kitchen doxycycline (VIBRA-TABS) 100 MG tablet Take 1 tablet (100 mg total) by mouth 2 (two) times daily for 10 days. PLEASE DELIVER TO PATIENT  . ELIQUIS 2.5 MG TABS tablet TAKE 1 TABLET TWICE A DAY  . ENBREL SURECLICK 50 MG/ML injection   . leflunomide (ARAVA) 20 MG tablet Take 20 mg by mouth daily.  . metoprolol succinate (TOPROL-XL) 50 MG 24 hr tablet TAKE 1 TABLET DAILY  . mirtazapine (REMERON) 15 MG tablet Take 1 tablet (15 mg total) by mouth at bedtime.  . Multiple Vitamins-Minerals (PRESERVISION AREDS 2) CAPS Take 1 Dose by mouth 2 (two) times daily. For eye health  . omeprazole (PRILOSEC) 40 MG capsule Take 1 capsule (40 mg total) by mouth daily.  . polycarbophil (FIBERCON) 625 MG tablet Take 1 tablet (625 mg total) by mouth daily.  Marland Kitchen SYNTHROID 75 MCG tablet TAKE 1 TABLET DAILY BEFORE BREAKFAST FOR HYPOTHYROIDISM  . valsartan (DIOVAN) 80 MG tablet TAKE 1 TABLET DAILY  . vitamin B-12 (CYANOCOBALAMIN) 1000 MCG tablet Take 1,000 mcg by mouth daily.    No facility-administered encounter medications on file as of 04/04/2020.    Allergies: Allergies  Allergen Reactions  . Codeine Other (See Comments)    "  just don't take it well"  . Molds & Smuts   . Pollen Extract Swelling  . Bee Venom Swelling and Rash    Family History: Family History  Problem Relation Age of Onset  . Lung disease Mother 39  . Alcohol abuse Son   . Heart disease Father 92    Social History: Social History   Socioeconomic History  . Marital status: Widowed    Spouse name: Not on file  . Number of children: Not on file  . Years of education: Not on file  . Highest education level: Not on file  Occupational History  . Occupation: Automotive engineer    Comment: retired   Tobacco Use  . Smoking status: Never Smoker  . Smokeless tobacco: Never Used  Vaping Use  .  Vaping Use: Never used  Substance and Sexual Activity  . Alcohol use: Yes    Alcohol/week: 1.0 standard drink    Types: 1 Glasses of wine per week    Comment: 1-2 per week  . Drug use: No  . Sexual activity: Not Currently  Other Topics Concern  . Not on file  Social History Narrative   Social History      Diet? Healthy- low salt, sugar, fat      Do you drink/eat things with caffeine? On occasion      Marital status?        widow                        What year were you married? 1958      Do you live in a house, apartment, assisted living, condo, trailer, etc.? apartment      Is it one or more stories? one      How many persons live in your home? one      Do you have any pets in your home? (please list) no      Highest level of education completed? 4 year college      Current or past profession: Statistician      Do you exercise?             yes                         Type & how often? Classes- senior retirement community/ some walking      Advanced Directives      Do you have a living will?      Do you have a DNR form?                                  If not, do you want to discuss one?      Do you have signed POA/HPOA for forms?       Functional Status Completed by: Daughter, Di Kindle      Do you have difficulty bathing or dressing yourself? no      Do you have difficulty preparing food or eating? no      Do you have difficulty managing your medications? no      Do you have difficulty managing your finances? no      Do you have difficulty affording your medications? no   Right handed   Social Determinants of Health   Financial Resource Strain: Not on file  Food Insecurity: Not on file  Transportation Needs: Not  on file  Physical Activity: Not on file  Stress: Not on file  Social Connections: Not on file  Intimate Partner Violence: Not on file    Observations/Objective:   There were no vitals taken for this visit. No acute distress.   Alert and oriented.  Speech fluent and not dysarthric.  Language intact.    Assessment and Plan:   1.  TIA -  Secondary stroke prevention management is already optimized.  No significant intracranial or extracranial large vessel stenosis or occlusion.  LDL at goal.  Hgb A1c at goal.  We can consider changing from Eliquis to Xarelto, but that would be only a lateral change.  Due to cerebral amyloid angiopathy, I would still not restart aspirin.  Therefore, no change in management at this time. 2.   Atrial fibrillation 3.  History of left frontal lobe infarct, likely cardioembolic 4.  Cerebral amyloid angiopathy 5.  Mild major neurocognitive disorder, vascular 6.  Hypertension 7.  Hyperlipidemia  1.  Secondary stroke prevention: -  Eliquis -  Statin therapy (LDL goal less than 70) -  Blood pressure control - Glycemic control (Hgb A1c goal less than 7) 2.  Follow up in June as already scheduled.  Follow Up Instructions:    -I discussed the assessment and treatment plan with the patient. The patient was provided an opportunity to ask questions and all were answered. The patient agreed with the plan and demonstrated an understanding of the instructions.   The patient was advised to call back or seek an in-person evaluation if the symptoms worsen or if the condition fails to improve as anticipated.   Metta Clines, DO  CC: Hollace Kinnier, DO

## 2020-04-04 ENCOUNTER — Encounter: Payer: Self-pay | Admitting: Neurology

## 2020-04-04 ENCOUNTER — Other Ambulatory Visit: Payer: Self-pay

## 2020-04-04 ENCOUNTER — Telehealth (INDEPENDENT_AMBULATORY_CARE_PROVIDER_SITE_OTHER): Payer: Medicare Other | Admitting: Neurology

## 2020-04-04 DIAGNOSIS — E785 Hyperlipidemia, unspecified: Secondary | ICD-10-CM

## 2020-04-04 DIAGNOSIS — I4821 Permanent atrial fibrillation: Secondary | ICD-10-CM

## 2020-04-04 DIAGNOSIS — I680 Cerebral amyloid angiopathy: Secondary | ICD-10-CM

## 2020-04-04 DIAGNOSIS — I10 Essential (primary) hypertension: Secondary | ICD-10-CM

## 2020-04-04 DIAGNOSIS — F01A0 Vascular dementia, mild, without behavioral disturbance, psychotic disturbance, mood disturbance, and anxiety: Secondary | ICD-10-CM

## 2020-04-04 DIAGNOSIS — G459 Transient cerebral ischemic attack, unspecified: Secondary | ICD-10-CM

## 2020-04-04 DIAGNOSIS — F0150 Vascular dementia without behavioral disturbance: Secondary | ICD-10-CM

## 2020-04-05 DIAGNOSIS — D509 Iron deficiency anemia, unspecified: Secondary | ICD-10-CM | POA: Diagnosis not present

## 2020-04-05 DIAGNOSIS — D7589 Other specified diseases of blood and blood-forming organs: Secondary | ICD-10-CM | POA: Diagnosis not present

## 2020-04-05 DIAGNOSIS — E782 Mixed hyperlipidemia: Secondary | ICD-10-CM | POA: Diagnosis not present

## 2020-04-05 DIAGNOSIS — E538 Deficiency of other specified B group vitamins: Secondary | ICD-10-CM | POA: Diagnosis not present

## 2020-04-05 LAB — HEPATIC FUNCTION PANEL
ALT: 20 (ref 7–35)
AST: 23 (ref 13–35)
Alkaline Phosphatase: 91 (ref 25–125)
Bilirubin, Total: 0.7

## 2020-04-05 LAB — CBC AND DIFFERENTIAL
HCT: 33 — AB (ref 36–46)
Hemoglobin: 11.6 — AB (ref 12.0–16.0)
Platelets: 172 (ref 150–399)
WBC: 6
WBC: 6

## 2020-04-05 LAB — BASIC METABOLIC PANEL
BUN: 19 (ref 4–21)
CO2: 25 — AB (ref 13–22)
Chloride: 98 — AB (ref 99–108)
Creatinine: 0.7 (ref 0.5–1.1)
Glucose: 100
Potassium: 4.6 (ref 3.4–5.3)
Sodium: 134 — AB (ref 137–147)

## 2020-04-05 LAB — CBC: RBC: 3.26 — AB (ref 3.87–5.11)

## 2020-04-05 LAB — COMPREHENSIVE METABOLIC PANEL: Calcium: 9.5 (ref 8.7–10.7)

## 2020-04-06 ENCOUNTER — Encounter: Payer: Self-pay | Admitting: Internal Medicine

## 2020-04-06 ENCOUNTER — Encounter: Payer: Self-pay | Admitting: *Deleted

## 2020-04-07 ENCOUNTER — Other Ambulatory Visit: Payer: Self-pay

## 2020-04-07 MED ORDER — DONEPEZIL HCL 10 MG PO TABS: 10.0000 mg | ORAL_TABLET | Freq: Every day | ORAL | 5 refills | Status: DC

## 2020-04-07 NOTE — Progress Notes (Signed)
Per DR.Jaffe script sent

## 2020-04-07 NOTE — Telephone Encounter (Signed)
OK to refill donepezil 10mg  at bedtime

## 2020-04-13 ENCOUNTER — Other Ambulatory Visit: Payer: Self-pay | Admitting: *Deleted

## 2020-04-13 MED ORDER — APIXABAN 2.5 MG PO TABS: 2.5000 mg | ORAL_TABLET | Freq: Two times a day (BID) | ORAL | 3 refills | Status: DC

## 2020-04-13 NOTE — Telephone Encounter (Signed)
Received fax refill Request from [facility].

## 2020-05-05 ENCOUNTER — Other Ambulatory Visit: Payer: Self-pay | Admitting: Neurology

## 2020-05-16 ENCOUNTER — Encounter: Payer: Self-pay | Admitting: Internal Medicine

## 2020-06-08 ENCOUNTER — Encounter: Payer: Self-pay | Admitting: Internal Medicine

## 2020-06-08 ENCOUNTER — Non-Acute Institutional Stay: Payer: Medicare Other | Admitting: Internal Medicine

## 2020-06-08 ENCOUNTER — Other Ambulatory Visit: Payer: Self-pay

## 2020-06-08 VITALS — BP 134/86 | HR 67 | Temp 98.1°F | Ht 62.0 in | Wt 123.0 lb

## 2020-06-08 DIAGNOSIS — R252 Cramp and spasm: Secondary | ICD-10-CM | POA: Diagnosis not present

## 2020-06-08 DIAGNOSIS — M21961 Unspecified acquired deformity of right lower leg: Secondary | ICD-10-CM | POA: Diagnosis not present

## 2020-06-08 DIAGNOSIS — I4891 Unspecified atrial fibrillation: Secondary | ICD-10-CM

## 2020-06-08 DIAGNOSIS — M6281 Muscle weakness (generalized): Secondary | ICD-10-CM

## 2020-06-08 DIAGNOSIS — I4821 Permanent atrial fibrillation: Secondary | ICD-10-CM

## 2020-06-08 DIAGNOSIS — R159 Full incontinence of feces: Secondary | ICD-10-CM | POA: Diagnosis not present

## 2020-06-08 DIAGNOSIS — R195 Other fecal abnormalities: Secondary | ICD-10-CM | POA: Diagnosis not present

## 2020-06-08 DIAGNOSIS — D6869 Other thrombophilia: Secondary | ICD-10-CM | POA: Diagnosis not present

## 2020-06-08 DIAGNOSIS — R152 Fecal urgency: Secondary | ICD-10-CM

## 2020-06-08 DIAGNOSIS — N3281 Overactive bladder: Secondary | ICD-10-CM

## 2020-06-08 MED ORDER — COENZYME Q10 30 MG PO CAPS: 30.0000 mg | ORAL_CAPSULE | Freq: Every day | ORAL | 0 refills | Status: DC

## 2020-06-08 MED ORDER — METAMUCIL MULTIHEALTH FIBER 63 % PO POWD: 1.0000 | Freq: Every morning | ORAL | 0 refills | Status: DC

## 2020-06-08 MED ORDER — MIRABEGRON ER 25 MG PO TB24
25.0000 mg | ORAL_TABLET | Freq: Every day | ORAL | 3 refills | Status: DC
Start: 2020-06-08 — End: 2020-06-16

## 2020-06-08 NOTE — Progress Notes (Signed)
Location:  Occupational psychologist of Service:  Clinic (12)  Provider: Cherrelle Plante L. Mariea Clonts, D.O., C.M.D.  Code Status: DNR Goals of Care:  Advanced Directives 06/08/2020  Does Patient Have a Medical Advance Directive? Yes  Type of Paramedic of [place];Out of facility DNR (pink MOST or yellow form)  Does patient want to make changes to medical advance directive? No - Patient declined  Copy of [place] in Chart? -  Would patient like information on creating a medical advance directive? -  Pre-existing out of facility DNR order (yellow form or pink MOST form) -     Chief Complaint  Patient presents with  . Medical Management of Chronic Issues    Medical Management of Chronic Issues. 2 Month Follow up. Here with daughter, Di Kindle    HPI: Patient is a 87 y.o. female seen today for medical management of chronic diseases.    [name] had horrible leg cramps at night.  Waking every 2 hrs with them.  Happens a lot of nights.  Gripping her toes and her feet will turn.  Has tried tonic water and has tried mustard.    [name] had diarrhea this morning--went 4 times this am.  Says she has been using diet tonic water and that caused diarrhea.    No recent back trouble.  She wants it to be down that she is going to be cremated.  Her daughter will be out of town a lot.    Urinary frequency remains ongoing.  She now has no control over bowel movements.  She has leakage of both.  It's affecting her quality of life.  [name] tried to eat more fiber.  She cannot tighten muscles for bowels and bladder.  She is wearing depends and an extra pad.   Was using her knee brace and her ankle was terribly swollen.  Her quads have been weak.  She goes to her classes--water classes.  Hosie Spangle is wanting her to have another PT eval about her quad strength.  She has stopped her nustep b/c it was aggravating her knees.  She has significant deformity of  the knee.    She speaks slower and is overall slowing down.  No further TIAs recently.  Dr. Tomi Likens did an assessment and felt she was at the lower end of MCI just before dementia per [facility].  [name] manages her medications.    Past Medical History:  Diagnosis Date  . Anemia   . Anxiety   . Arthritis    Back  . Atrial fibrillation ([place])   . Cancer [facility]) 2006   Colon.  Basal Cell Skin cancer- right arm  . Colon cancer ([place])   . Constipation due to pain medication therapy    after heart surgery  . Dysrhythmia    PAF  . GERD (gastroesophageal reflux disease)   . Heart murmur   . Hypertension   . Hypothyroidism   . RA (rheumatoid arthritis) ([place])   . Restless leg   . Seizures ([place])    after Heart Surgey  . Stroke [facility])    TIA- found by neurologist after     Past Surgical History:  Procedure Laterality Date  . ABDOMINAL HYSTERECTOMY  1970   Partial   . COLON RESECTION  2006   cancer  . COLONOSCOPY    . EYE SURGERY Bilateral    Cataract  . MAXIMUM ACCESS (MAS)POSTERIOR LUMBAR INTERBODY FUSION (PLIF) 2 LEVEL N/A 04/12/2015  Procedure: Lumbar Three-Five Decompression, Pedicle Screw Fixation, and Posteriolateral Arthrodesis;  Surgeon: Erline Levine, MD;  Location: [place] [facility] ORS;  Service: Neurosurgery;  Laterality: N/A;  L3-4 L4-5 Maximum access posterior lumbar fusion, possible interbodies and resection of synovial cyst at L4-5  . MITRAL VALVE REPAIR  01/20/13   Gore-tex cords to P1, P2, and P3. Magic suture to posterior medial commisure, #30 Physio 1 ring. Done in [place]  . TONSILLECTOMY     about 1940  . TRICUSPID VALVE SURGERY  01/20/13   #28 TriAd ring done in [place]    Allergies  Allergen Reactions  . Codeine Other (See Comments)    "just don't take it well"  . Molds & Smuts   . Pollen Extract Swelling  . Bee Venom Swelling and Rash    Outpatient Encounter Medications as of 06/08/2020  Medication Sig  . acetaminophen (TYLENOL) 500 MG tablet Take 1,000 mg by  mouth 2 (two) times daily as needed for moderate pain.   Marland Kitchen apixaban (ELIQUIS) 2.5 MG TABS tablet Take 1 tablet (2.5 mg total) by mouth 2 (two) times daily.  Marland Kitchen atorvastatin (LIPITOR) 20 MG tablet TAKE 1 TABLET DAILY  . digoxin (LANOXIN) 0.125 MG tablet TAKE 1 TABLET DAILY  . donepezil (ARICEPT) 10 MG tablet Take 1 tablet (10 mg total) by mouth at bedtime.  [name] SURECLICK 50 MG/ML injection   . leflunomide (ARAVA) 20 MG tablet Take 20 mg by mouth daily.  . metoprolol succinate (TOPROL-XL) 50 MG 24 hr tablet TAKE 1 TABLET DAILY  . mirtazapine (REMERON) 15 MG tablet Take 1 tablet (15 mg total) by mouth at bedtime.  . Multiple Vitamins-Minerals (PRESERVISION AREDS 2) CAPS Take 1 Dose by mouth 2 (two) times daily. For eye health  . omeprazole (PRILOSEC) 40 MG capsule Take 1 capsule (40 mg total) by mouth daily.  Marland Kitchen SYNTHROID 75 MCG tablet TAKE 1 TABLET DAILY BEFORE BREAKFAST FOR HYPOTHYROIDISM  . valsartan (DIOVAN) 80 MG tablet TAKE 1 TABLET DAILY  . vitamin B-12 (CYANOCOBALAMIN) 1000 MCG tablet Take 1,000 mcg by mouth daily.    No facility-administered encounter medications on file as of 06/08/2020.    Review of Systems:  Review of Systems  Constitutional: Negative for chills and fever.  HENT: Positive for hearing loss.   Eyes: Negative for blurred vision.  Respiratory: Negative for cough and shortness of breath.   Cardiovascular: Negative for chest pain, palpitations and leg swelling.  Gastrointestinal: Negative for abdominal pain, blood in stool, constipation and melena.       Loose stools and incontinence  Genitourinary: Positive for frequency and urgency. Negative for dysuria.       Incontinence  Musculoskeletal: Positive for joint pain. Negative for falls.       Right knee; no changes with back  Skin: Negative for itching and rash.  Neurological: Positive for weakness. Negative for dizziness and loss of consciousness.  Endo/Heme/Allergies: Bruises/bleeds easily.   Psychiatric/Behavioral: Positive for memory loss. Negative for depression. The patient is not nervous/anxious and does not have insomnia.        Sleeping better most nights    Health Maintenance  Topic Date Due  . TETANUS/TDAP  04/16/2027  . INFLUENZA VACCINE  Completed  . DEXA SCAN  Completed  . COVID-19 Vaccine  Completed  . PNA vac Low Risk Adult  Completed  . HPV VACCINES  Aged Out    Physical Exam: Vitals:   06/08/20 1354  BP: 134/86  Pulse: 67  Temp: 98.1 F (  36.7 C)  TempSrc: Skin  SpO2: 97%  Weight: 123 lb (55.8 kg)  Height: 5\' 2"  (1.575 m)   Body mass index is 22.5 kg/m. Physical Exam Vitals reviewed.  Constitutional:      General: She is not in acute distress.    Appearance: Normal appearance. She is not toxic-appearing.  Cardiovascular:     Rate and Rhythm: Rhythm irregular.     Heart sounds: No murmur heard.   Pulmonary:     Effort: Pulmonary effort is normal.     Breath sounds: Normal breath sounds. No wheezing, rhonchi or rales.  Abdominal:     General: Bowel sounds are normal.     Palpations: Abdomen is soft.  Musculoskeletal:     Right lower leg: No edema.     Left lower leg: No edema.     Comments: Right knee valgus deformity  Skin:    General: Skin is warm and dry.  Neurological:     General: No focal deficit present.     Mental Status: She is alert and oriented to person, place, and time.     Gait: Gait abnormal.     Comments: Using rolling walker with skis and tennis balls added  Psychiatric:        Mood and Affect: Mood normal.        Behavior: Behavior normal.     Labs reviewed: Basic Metabolic Panel: Recent Labs    03/29/20 0000 03/29/20 1525 03/29/20 1537 04/05/20 0000  NA 140 138 137 134*  K 5.2 4.2 4.4 4.6  CL 103 102 104 98*  CO2 25* 26  --  25*  GLUCOSE  --  107* 103*  --   BUN 15 18 23 19   CREATININE 0.8 0.93 0.90 0.7  CALCIUM 9.6 9.0  --  9.5   Liver Function Tests: Recent Labs    03/29/20 0000  03/29/20 1525 04/05/20 0000  AST  --  24 23  ALT  --  23 20  ALKPHOS  --  72 91  BILITOT  --  0.8  --   PROT  --  6.5  --   ALBUMIN 4.5 3.9  --    No results for input(s): LIPASE, AMYLASE in the last 8760 hours. No results for input(s): AMMONIA in the last 8760 hours. CBC: Recent Labs    03/29/20 0000 03/29/20 1525 03/29/20 1537 04/05/20 0000  WBC 5.5 6.9  --  6.0  6.0  NEUTROABS  --  3.1  --   --   HGB 11.3* 10.9* 11.9* 11.6*  HCT 33* 32.6* 35.0* 33*  MCV  --  105.8*  --   --   PLT 173 169  --  172   Lipid Panel: No results for input(s): CHOL, HDL, LDLCALC, TRIG, CHOLHDL, LDLDIRECT in the last 8760 hours. Lab Results  Component Value Date   HGBA1C 5.5 01/12/2013    Assessment/Plan 1. Leg cramps - recently much worse and waking her at night - not restless, but true cramps - check bmp, magnesium in am here in lab - try coq10 supplement 30mg  qhs for 3 days--if working, cont same; if still having cramping, increase to 60mg  and monitor - co-enzyme Q-10 30 MG capsule; Take 1 capsule (30 mg total) by mouth at bedtime.  Dispense: 30 capsule; Refill: 0  2. Permanent atrial fibrillation (HCC) -cont eliquis, dig, toprol   3. Hypercoagulable state due to atrial fibrillation ([place]) -doing fine on eliquis at present  4. Overactive bladder -  conservative measures with regular toileting and reducing fluids before bed have not helped, she is still getting up at night and having leakage - try mirabegron ER (MYRBETRIQ) 25 MG TB24 tablet; Take 1 tablet (25 mg total) by mouth daily.  Dispense: 90 tablet; Refill: 3 -cont depends  5. Loose stools - with incontinence -increased dietary fiber has not been effective for her -add Psyllium (METAMUCIL MULTIHEALTH FIBER) 63 % POWD; Take 1 Scoop by mouth in the morning.  Dispense: 660 g; Refill: 0  6. Incontinence of feces with fecal urgency - try to bulk stool in hopes it will prevent leakage  -if bowels and bladder do not improve, may  be more functional or neurologic - Psyllium (METAMUCIL MULTIHEALTH FIBER) 63 % POWD; Take 1 Scoop by mouth in the morning.  Dispense: 660 g; Refill: 0  Proximal muscle weakness -will ask for PT eval again about quad strengthening exercises per her daughter's request--if she's at her max potential, may need to involve Robin or a personal trainer to help with exercise program  Right knee deformity -due to advanced OA -cont regular exercise, but not overdoing with too many things  Labs/tests ordered:  Bmp, mag in am Next appt:  4 mos in [facility]  Anagabriela Jokerst L. Arsenio Schnorr, D.O. [facility] [address],  [address] Cell Phone (Mon-Fri 8am-5pm):  [phone] On Call:  [phone] & follow prompts after 5pm & weekends Office Phone:  [phone] Office Fax:  [phone]

## 2020-06-09 DIAGNOSIS — R252 Cramp and spasm: Secondary | ICD-10-CM | POA: Diagnosis not present

## 2020-06-09 DIAGNOSIS — D6869 Other thrombophilia: Secondary | ICD-10-CM | POA: Diagnosis not present

## 2020-06-09 LAB — COMPREHENSIVE METABOLIC PANEL: Calcium: 9.3 (ref 8.7–10.7)

## 2020-06-09 LAB — BASIC METABOLIC PANEL
BUN: 22 — AB (ref 4–21)
CO2: 22 (ref 13–22)
Chloride: 101 (ref 99–108)
Creatinine: 0.8 (ref 0.5–1.1)
Glucose: 94
Potassium: 4.4 (ref 3.4–5.3)
Sodium: 134 — AB (ref 137–147)

## 2020-06-13 ENCOUNTER — Telehealth: Payer: Self-pay | Admitting: *Deleted

## 2020-06-13 NOTE — Telephone Encounter (Signed)
Received fax from [place] [phone] for Prior Authorization for Myrbetriq.  Placed form in Dr. Cyndi Lennert folder to review, Venida Jarvis out and sign.  To be faxed back to [facility] Fax: [phone] once completed.  Case ID: [id number]

## 2020-06-14 DIAGNOSIS — M0579 Rheumatoid arthritis with rheumatoid factor of multiple sites without organ or systems involvement: Secondary | ICD-10-CM | POA: Diagnosis not present

## 2020-06-14 DIAGNOSIS — Z6822 Body mass index (BMI) 22.0-22.9, adult: Secondary | ICD-10-CM | POA: Diagnosis not present

## 2020-06-14 DIAGNOSIS — Z79899 Other long term (current) drug therapy: Secondary | ICD-10-CM | POA: Diagnosis not present

## 2020-06-14 DIAGNOSIS — M150 Primary generalized (osteo)arthritis: Secondary | ICD-10-CM | POA: Diagnosis not present

## 2020-06-14 DIAGNOSIS — M5136 Other intervertebral disc degeneration, lumbar region: Secondary | ICD-10-CM | POA: Diagnosis not present

## 2020-06-16 ENCOUNTER — Encounter: Payer: Self-pay | Admitting: Internal Medicine

## 2020-06-16 ENCOUNTER — Other Ambulatory Visit: Payer: Self-pay | Admitting: Internal Medicine

## 2020-06-16 DIAGNOSIS — I482 Chronic atrial fibrillation, unspecified: Secondary | ICD-10-CM

## 2020-06-16 MED ORDER — GEMTESA 75 MG PO TABS: ORAL_TABLET | ORAL | 1 refills | Status: DC

## 2020-06-16 NOTE — Telephone Encounter (Signed)
Called [phone] and spoke with Zigmund Daniel to check the status of Prior Authorization. She stated that it was still pending. She went ahead and finished the process with the pharmacist and they have DENIED the Myrbetriq.   Patient has to try and fail GEMTESA .   Please Advise.

## 2020-06-16 NOTE — Telephone Encounter (Signed)
I agree with trying Gemtesa 75 mg qd first. Thank you

## 2020-06-16 NOTE — Addendum Note (Signed)
Addended by: Rafael Bihari A on: 06/16/2020 02:49 PM   Modules accepted: Orders

## 2020-06-16 NOTE — Telephone Encounter (Signed)
Ok to try Gemtesa 75 mg qd #30 with 3 refills

## 2020-06-16 NOTE — Telephone Encounter (Signed)
Patient daughter notified through [employer]. A reply was added from the one daughter sent this morning.

## 2020-06-20 ENCOUNTER — Telehealth: Payer: Self-pay | Admitting: Family

## 2020-06-20 DIAGNOSIS — Z961 Presence of intraocular lens: Secondary | ICD-10-CM | POA: Diagnosis not present

## 2020-06-20 DIAGNOSIS — H40013 Open angle with borderline findings, low risk, bilateral: Secondary | ICD-10-CM | POA: Diagnosis not present

## 2020-06-20 NOTE — Telephone Encounter (Signed)
Called patient to confirm new PCP. Patient didn't answer the phone. Voicemail was left with office call back number.

## 2020-06-20 NOTE — Telephone Encounter (Signed)
Noted  

## 2020-06-20 NOTE — Telephone Encounter (Signed)
Approval to cover myrbetriq ER tablet was denied by insurance letter states need to have tried and failed or has a contraindication to vibegron Kathy Velez ).have tried Vibegron in the past ? Or would like to try Vibegron 75 mg tablet daily?

## 2020-06-20 NOTE — Telephone Encounter (Signed)
See message from 06/13/2020.  Kathy Velez has changed medication to [place] and daughter was notified

## 2020-06-21 DIAGNOSIS — G458 Other transient cerebral ischemic attacks and related syndromes: Secondary | ICD-10-CM | POA: Diagnosis not present

## 2020-06-21 DIAGNOSIS — R2689 Other abnormalities of gait and mobility: Secondary | ICD-10-CM | POA: Diagnosis not present

## 2020-06-21 DIAGNOSIS — M1711 Unilateral primary osteoarthritis, right knee: Secondary | ICD-10-CM | POA: Diagnosis not present

## 2020-06-21 DIAGNOSIS — M62561 Muscle wasting and atrophy, not elsewhere classified, right lower leg: Secondary | ICD-10-CM | POA: Diagnosis not present

## 2020-06-21 DIAGNOSIS — R278 Other lack of coordination: Secondary | ICD-10-CM | POA: Diagnosis not present

## 2020-06-21 DIAGNOSIS — M25561 Pain in right knee: Secondary | ICD-10-CM | POA: Diagnosis not present

## 2020-06-23 DIAGNOSIS — M62561 Muscle wasting and atrophy, not elsewhere classified, right lower leg: Secondary | ICD-10-CM | POA: Diagnosis not present

## 2020-06-23 DIAGNOSIS — R2689 Other abnormalities of gait and mobility: Secondary | ICD-10-CM | POA: Diagnosis not present

## 2020-06-23 DIAGNOSIS — M25561 Pain in right knee: Secondary | ICD-10-CM | POA: Diagnosis not present

## 2020-06-23 DIAGNOSIS — M1711 Unilateral primary osteoarthritis, right knee: Secondary | ICD-10-CM | POA: Diagnosis not present

## 2020-06-23 DIAGNOSIS — G458 Other transient cerebral ischemic attacks and related syndromes: Secondary | ICD-10-CM | POA: Diagnosis not present

## 2020-06-23 DIAGNOSIS — R278 Other lack of coordination: Secondary | ICD-10-CM | POA: Diagnosis not present

## 2020-06-24 MED ORDER — GEMTESA 75 MG PO TABS: ORAL_TABLET | ORAL | 1 refills | Status: DC

## 2020-06-24 NOTE — Addendum Note (Signed)
Addended by: Logan Bores on: 06/24/2020 07:45 AM   Modules accepted: Orders

## 2020-06-27 ENCOUNTER — Telehealth: Payer: Self-pay

## 2020-06-27 DIAGNOSIS — M1711 Unilateral primary osteoarthritis, right knee: Secondary | ICD-10-CM | POA: Diagnosis not present

## 2020-06-27 DIAGNOSIS — M25561 Pain in right knee: Secondary | ICD-10-CM | POA: Diagnosis not present

## 2020-06-27 DIAGNOSIS — R2689 Other abnormalities of gait and mobility: Secondary | ICD-10-CM | POA: Diagnosis not present

## 2020-06-27 DIAGNOSIS — G458 Other transient cerebral ischemic attacks and related syndromes: Secondary | ICD-10-CM | POA: Diagnosis not present

## 2020-06-27 DIAGNOSIS — M62561 Muscle wasting and atrophy, not elsewhere classified, right lower leg: Secondary | ICD-10-CM | POA: Diagnosis not present

## 2020-06-27 DIAGNOSIS — R278 Other lack of coordination: Secondary | ICD-10-CM | POA: Diagnosis not present

## 2020-06-27 NOTE — Telephone Encounter (Signed)
Incoming fax from [facility] states that medication "Gemtesa 75mg " needs to have a selected alternative medication. Alternative medications are Oxybutynin IR 5mg , Oxybutynin ER, Trospium, Detrol, and Tolterodine. Message routed to PCP Virgie Dad, MD for review. Please Advise.

## 2020-06-29 DIAGNOSIS — G458 Other transient cerebral ischemic attacks and related syndromes: Secondary | ICD-10-CM | POA: Diagnosis not present

## 2020-06-29 DIAGNOSIS — R2689 Other abnormalities of gait and mobility: Secondary | ICD-10-CM | POA: Diagnosis not present

## 2020-06-29 DIAGNOSIS — M1711 Unilateral primary osteoarthritis, right knee: Secondary | ICD-10-CM | POA: Diagnosis not present

## 2020-06-29 DIAGNOSIS — R278 Other lack of coordination: Secondary | ICD-10-CM | POA: Diagnosis not present

## 2020-06-29 DIAGNOSIS — M62561 Muscle wasting and atrophy, not elsewhere classified, right lower leg: Secondary | ICD-10-CM | POA: Diagnosis not present

## 2020-06-29 DIAGNOSIS — M25561 Pain in right knee: Secondary | ICD-10-CM | POA: Diagnosis not present

## 2020-07-01 ENCOUNTER — Encounter: Payer: Self-pay | Admitting: Internal Medicine

## 2020-07-04 ENCOUNTER — Encounter: Payer: Self-pay | Admitting: *Deleted

## 2020-07-04 DIAGNOSIS — R2689 Other abnormalities of gait and mobility: Secondary | ICD-10-CM | POA: Diagnosis not present

## 2020-07-04 DIAGNOSIS — M62561 Muscle wasting and atrophy, not elsewhere classified, right lower leg: Secondary | ICD-10-CM | POA: Diagnosis not present

## 2020-07-04 DIAGNOSIS — R278 Other lack of coordination: Secondary | ICD-10-CM | POA: Diagnosis not present

## 2020-07-04 DIAGNOSIS — M25561 Pain in right knee: Secondary | ICD-10-CM | POA: Diagnosis not present

## 2020-07-04 DIAGNOSIS — G458 Other transient cerebral ischemic attacks and related syndromes: Secondary | ICD-10-CM | POA: Diagnosis not present

## 2020-07-04 DIAGNOSIS — M1711 Unilateral primary osteoarthritis, right knee: Secondary | ICD-10-CM | POA: Diagnosis not present

## 2020-07-04 NOTE — Telephone Encounter (Signed)
Labs Abstracted. Magnesium could not be abstracted into chart. (Below is a copy and paste of patient's labs).      TEST RESULT REF RANGE UNIT Glucose (R) 94 70-99 mg/dL Calcium (R) 9.3 8.6-10.2 mg/dL CREATININE (R) 0.79 0.50-1.20 mg/dL BUN (R) H 21.6 6.0-20.0 mg/dL BUN/CREAT RATIO (R) H 27.3 6.0-25.0 SODIUM (R) L 134 136-145 mmol/L POTASSIUM (R) 4.4 3.3-5.1 mmol/L CHLORIDE (R) 101 98-107 mmol/L CARBON DIOXIDE (R) 22 22-31 mEq/L ANGAP (R) 16.1 Osmolality L 271.2 285.0-295.0 mOs/kg eGFR (Non-African American) 67.72 eGFR (African American) 78.49  PLEASE NOTE: Our reference range has been updated  Reference Range  >90 mL/min/1.15m2 Stage 1 CKD  60-89 mL/min/1.741m Stage 2 CKD  30-59 mL/min/1.7326mStage 3 CKD  15-29 mL/min/1.108m6mtage 4 CKD **eGFR calculated using CKD-EPI 2009 (Creatinine) equation Final - Approved 06/09/2020 3:18PM Collected: 06/09/2020 8:12AM TEST RESULT REF RANGE UNIT MAGNESIUM (R) 2.0 1.7-2.8 mg/d

## 2020-07-05 NOTE — Telephone Encounter (Signed)
Message routed to PCP Gupta, Anjali L, MD  

## 2020-08-01 ENCOUNTER — Encounter: Payer: Self-pay | Admitting: Adult Health

## 2020-08-01 ENCOUNTER — Other Ambulatory Visit: Payer: Self-pay

## 2020-08-01 ENCOUNTER — Non-Acute Institutional Stay: Payer: Medicare Other | Admitting: Adult Health

## 2020-08-01 VITALS — BP 138/90 | HR 75 | Temp 97.7°F | Ht 62.0 in | Wt 122.0 lb

## 2020-08-01 DIAGNOSIS — R252 Cramp and spasm: Secondary | ICD-10-CM

## 2020-08-01 DIAGNOSIS — R339 Retention of urine, unspecified: Secondary | ICD-10-CM

## 2020-08-01 DIAGNOSIS — N3281 Overactive bladder: Secondary | ICD-10-CM | POA: Diagnosis not present

## 2020-08-01 DIAGNOSIS — R195 Other fecal abnormalities: Secondary | ICD-10-CM

## 2020-08-01 NOTE — Progress Notes (Signed)
[place]: Clinic  Provider:  Cindi Carbon, [place] [phone]  Goals of Care:  Advanced Directives 06/08/2020  Does Patient Have a Medical Advance Directive? Yes  Type of Paramedic of [place];Out of facility DNR (pink MOST or yellow form)  Does patient want to make changes to medical advance directive? No - Patient declined  Copy of [place] in Chart? -  Would patient like information on creating a medical advance directive? -  Pre-existing out of facility DNR order (yellow form or pink MOST form) -     Chief Complaint  Patient presents with  . Acute Visit    Patient returns to the clinic to discuss her leg cramping at night, frequent urination and previous bowel issues.     HPI: Patient is a 87 y.o. female seen today for an acute visit.  Leg cramps: present for several months at night. Cramp or gripping symptom occurs at the midcalf area and down to her feet. BMP and MG normal.  Has tried CoQ10 without benefit and drinking water.   Has urinary urgency, leakage, feels like she has to go the nothing comes out. No bladder pain or frequency.  Myrbetriq and [place]ores were ordered but not covered by insurance.   She has a hx of loose stools and stool urgency after colon ca s/p surgery.  Now feels her symptoms are worse with more loose stools. No abd pain or fever. Tried metamucil which helped.   Takes aricept for memory loss.    Past Medical History:  Diagnosis Date  . Anemia   . Anxiety   . Arthritis    Back  . Atrial fibrillation ([place])   . Cancer [facility]) 2006   Colon.  Basal Cell Skin cancer- right arm  . Colon cancer ([place])   . Constipation due to pain medication therapy    after heart surgery  . Dysrhythmia    PAF  . GERD (gastroesophageal reflux disease)   . Heart murmur   . Hypertension   . Hypothyroidism   . RA (rheumatoid arthritis) ([place])   . Restless leg   .  Seizures ([place])    after Heart Surgey  . Stroke [facility])    TIA- found by neurologist after     Past Surgical History:  Procedure Laterality Date  . ABDOMINAL HYSTERECTOMY  1970   Partial   . COLON RESECTION  2006   cancer  . COLONOSCOPY    . EYE SURGERY Bilateral    Cataract  . MAXIMUM ACCESS (MAS)POSTERIOR LUMBAR INTERBODY FUSION (PLIF) 2 LEVEL N/A 04/12/2015   Procedure: Lumbar Three-Five Decompression, Pedicle Screw Fixation, and Posteriolateral Arthrodesis;  Surgeon: Erline Levine, MD;  Location: [place] NEURO ORS;  Service: Neurosurgery;  Laterality: N/A;  L3-4 L4-5 Maximum access posterior lumbar fusion, possible interbodies and resection of synovial cyst at L4-5  . MITRAL VALVE REPAIR  01/20/13   Gore-tex cords to P1, P2, and P3. Magic suture to posterior medial commisure, #30 Physio 1 ring. Done in [place]  . TONSILLECTOMY     about 1940  . TRICUSPID VALVE SURGERY  01/20/13   #28 TriAd ring done in [place]    Allergies  Allergen Reactions  . Codeine Other (See Comments)    "just don't take it well"  . Molds & Smuts   . Pollen Extract Swelling  . Bee Venom Swelling and Rash    Outpatient Encounter Medications as of 08/01/2020  Medication Sig  .  acetaminophen (TYLENOL) 500 MG tablet Take 1,000 mg by mouth 2 (two) times daily as needed for moderate pain.   Marland Kitchen apixaban (ELIQUIS) 2.5 MG TABS tablet Take 1 tablet (2.5 mg total) by mouth 2 (two) times daily.  Marland Kitchen atorvastatin (LIPITOR) 20 MG tablet TAKE 1 TABLET DAILY  . Cholecalciferol (VITAMIN D) 50 MCG (2000 UT) CAPS Take 1 capsule by mouth daily.  . Coenzyme Q10 (CO Q 10) 60 MG CAPS Take 1 capsule by mouth daily.  . digoxin (LANOXIN) 0.125 MG tablet TAKE 1 TABLET DAILY  . donepezil (ARICEPT) 10 MG tablet Take 1 tablet (10 mg total) by mouth at bedtime.  [name] SURECLICK 50 MG/ML injection   . leflunomide (ARAVA) 20 MG tablet Take 20 mg by mouth daily.  . metoprolol succinate (TOPROL-XL) 50 MG 24 hr tablet TAKE 1 TABLET DAILY  .  mirtazapine (REMERON) 15 MG tablet Take 1 tablet (15 mg total) by mouth at bedtime.  . Multiple Vitamins-Minerals (PRESERVISION AREDS 2) CAPS Take 1 Dose by mouth 2 (two) times daily. For eye health  . omeprazole (PRILOSEC) 40 MG capsule Take 1 capsule (40 mg total) by mouth daily.  . Psyllium (METAMUCIL MULTIHEALTH FIBER) 63 % POWD Take 1 Scoop by mouth in the morning.  Marland Kitchen SYNTHROID 75 MCG tablet TAKE 1 TABLET DAILY BEFORE BREAKFAST FOR HYPOTHYROIDISM  . valsartan (DIOVAN) 80 MG tablet TAKE 1 TABLET DAILY  . vitamin B-12 (CYANOCOBALAMIN) 1000 MCG tablet Take 1,000 mcg by mouth daily.   . [DISCONTINUED] co-enzyme Q-10 30 MG capsule Take 1 capsule (30 mg total) by mouth at bedtime. (Patient taking differently: Take 60 mg by mouth at bedtime.)  . [DISCONTINUED] Vibegron (GEMTESA) 75 MG TABS Take one tablet by mouth once daily   No facility-administered encounter medications on file as of 08/01/2020.    Review of Systems:  Review of Systems  Constitutional: Negative for activity change, appetite change, chills, diaphoresis, fatigue, fever and unexpected weight change.  HENT: Negative for congestion.   Respiratory: Negative for cough, shortness of breath and wheezing.   Cardiovascular: Negative for chest pain, palpitations and leg swelling.  Gastrointestinal: Positive for diarrhea. Negative for abdominal distention, abdominal pain and constipation.  Genitourinary: Positive for decreased urine volume, difficulty urinating, frequency and urgency. Negative for dysuria, flank pain, hematuria and pelvic pain.  Musculoskeletal: Positive for arthralgias and gait problem. Negative for back pain, joint swelling and myalgias.  Neurological: Negative for dizziness, tremors, seizures, syncope, facial asymmetry, speech difficulty, weakness, light-headedness, numbness and headaches.  Psychiatric/Behavioral: Negative for agitation, behavioral problems and confusion.    Health Maintenance  Topic Date Due  .  INFLUENZA VACCINE  10/24/2020  . TETANUS/TDAP  04/16/2027  . DEXA SCAN  Completed  . COVID-19 Vaccine  Completed  . PNA vac Low Risk Adult  Completed  . HPV VACCINES  Aged Out    Physical Exam: Vitals:   08/01/20 1531  BP: 138/90  Pulse: 75  Temp: 97.7 F (36.5 C)  SpO2: 95%  Weight: 122 lb (55.3 kg)  Height: 5\' 2"  (1.575 m)   Body mass index is 22.31 kg/m. Physical Exam Vitals and nursing note reviewed.  Constitutional:      General: She is not in acute distress.    Appearance: She is not diaphoretic.  HENT:     Head: Normocephalic and atraumatic.  Neck:     Vascular: No JVD.  Cardiovascular:     Rate and Rhythm: Normal rate. Rhythm irregular.     Heart  sounds: No murmur heard.   Pulmonary:     Effort: Pulmonary effort is normal. No respiratory distress.     Breath sounds: Normal breath sounds. No wheezing.  Abdominal:     General: Bowel sounds are normal. There is distension (mild).     Palpations: Abdomen is soft.     Tenderness: There is no abdominal tenderness. There is no right CVA tenderness or left CVA tenderness.  Skin:    General: Skin is warm and dry.  Neurological:     Mental Status: She is alert and oriented to person, place, and time.  Psychiatric:        Mood and Affect: Mood normal.     Labs reviewed: Basic Metabolic Panel: Recent Labs    03/29/20 1525 03/29/20 1537 04/05/20 0000 06/09/20 0000  NA 138 137 134* 134*  K 4.2 4.4 4.6 4.4  CL 102 104 98* 101  CO2 26  --  25* 22  GLUCOSE 107* 103*  --   --   BUN 18 23 19  22*  CREATININE 0.93 0.90 0.7 0.8  CALCIUM 9.0  --  9.5 9.3   Liver Function Tests: Recent Labs    03/29/20 0000 03/29/20 1525 04/05/20 0000  AST  --  24 23  ALT  --  23 20  ALKPHOS  --  72 91  BILITOT  --  0.8  --   PROT  --  6.5  --   ALBUMIN 4.5 3.9  --    No results for input(s): LIPASE, AMYLASE in the last 8760 hours. No results for input(s): AMMONIA in the last 8760 hours. CBC: Recent Labs     03/29/20 0000 03/29/20 1525 03/29/20 1537 04/05/20 0000  WBC 5.5 6.9  --  6.0  6.0  NEUTROABS  --  3.1  --   --   HGB 11.3* 10.9* 11.9* 11.6*  HCT 33* 32.6* 35.0* 33*  MCV  --  105.8*  --   --   PLT 173 169  --  172   Lipid Panel: No results for input(s): CHOL, HDL, LDLCALC, TRIG, CHOLHDL, LDLDIRECT in the last 8760 hours. Lab Results  Component Value Date   HGBA1C 5.5 01/12/2013    Procedures since last visit: No results found.  Assessment/Plan  1. Overactive bladder She has had issues getting myrbetriq and gemtesa covered by insurance. They suggested detrol but with her underlying memory loss this is not favorable due to s/e. See #2  2. Urinary retention Referral made due to bladder scan 250cc after voiding, along with symptoms of OAB Recommend double voiding and avoiding caffeine and alcohol - Ambulatory referral to Urology  3. Leg cramps MG and BMP normal She describes this symptoms as leg cramps rather than RLS. Could try neurontin or robaxin at night. Will order iron panel and CBC and if normal consider adding medication. See #4  4. Loose stools This is a long term issue due to prior colon surgery but worse more recently, along with leg cramps possibly this could be due to Aricept??? Will message Dr Tomi Likens for further information Could try metamucil bid or citrucel instead also  Labs/tests ordered:  CBC and iron panel in am   Total time 40:  time greater than 50% of total time spent doing pt counseling and coordination of care  Next appt:  10/10/2020

## 2020-08-01 NOTE — Patient Instructions (Addendum)
Could try increasing the metamucil to twice a day if loose stools persist or could change to citrucel 1 scoop daily I have contracted Dr. Tomi Likens and I am waiting to hear back about stopping the Aricept due to loose stools/leg cramps Referral was placed to urology due to urinary retention  Try double voiding. Avoid caffeine and alcholol

## 2020-08-02 DIAGNOSIS — D509 Iron deficiency anemia, unspecified: Secondary | ICD-10-CM | POA: Diagnosis not present

## 2020-08-02 LAB — CBC AND DIFFERENTIAL
HCT: 32 — AB (ref 36–46)
Hemoglobin: 10.7 — AB (ref 12.0–16.0)
Platelets: 189 (ref 150–399)
WBC: 4.6

## 2020-08-02 LAB — CBC: RBC: 3.11 — AB (ref 3.87–5.11)

## 2020-08-02 LAB — IRON,TIBC AND FERRITIN PANEL
%SAT: 35.62
Iron: 120
TIBC: 336
UIBC: 216

## 2020-08-05 NOTE — Telephone Encounter (Signed)
Message rerouted to Dr.Gupta. Please Advise.

## 2020-08-07 NOTE — Telephone Encounter (Signed)
Can you ask the Patient if Oxybutynin is good for them then I will change it? Thanks

## 2020-08-08 ENCOUNTER — Telehealth: Payer: Self-pay | Admitting: *Deleted

## 2020-08-08 NOTE — Telephone Encounter (Signed)
Called patient and spoke to her daughter "Di Kindle". She states that mother had an appointment with Royal Hawthorn, NP 08/01/2020. She states they received a referral and will be seeing Urologist soon. She states that medication is not necessary. Message routed back to PCP Virgie Dad, MD.

## 2020-08-08 NOTE — Telephone Encounter (Signed)
-----   Message from Royal Hawthorn, NP sent at 08/08/2020  9:11 AM EDT ----- Good morning!  I saw this pt in clinic and then followed up with a message to her daughter in epic as we discussed but she did not respond.   Could you reach out to [place]?  Message was stating that I did not hear back from Dr Tomi Likens but if she was still having diarrhea we could hold the aricept and see if it improved. Also could call in neurontin for leg cramps if she is still having trouble or give her time off aricept first to see if the symptoms of leg cramps improve before ordering neurontin   Thanks  Looking forward to working with you this afternoon   [employer]

## 2020-08-08 NOTE — Telephone Encounter (Signed)
Royal Hawthorn, NP  You 26 minutes ago (10:48 AM)   Ok the CBC and iron panel were WNL. I think they are in the green folder here waiting to be abstracted.    Message text      Daughter Notified and agreed.

## 2020-08-08 NOTE — Telephone Encounter (Signed)
LMOM for Kathy Velez, daughter, to return call.

## 2020-08-08 NOTE — Telephone Encounter (Signed)
Hosie Spangle, daughter, notified.   Daughter stated that they took patient off of the Aricept last Wednesday and the leg cramps have Improved. Stated that she is still having some but not as severe.  Doesn't want to start patient on a new medication at this time because daughter is going out of the country on Friday. So she wants to hold off right now starting any new medications.   She also wanted to let you know that she has gotten patient an appointment with Urologist mid June.   Also was wondering about lab results patient had done last Thursday at [facility].   Please Advise.

## 2020-08-24 ENCOUNTER — Ambulatory Visit
Admission: RE | Admit: 2020-08-24 | Discharge: 2020-08-24 | Disposition: A | Discharge status: Home / Self Care | Payer: Medicare Other | Source: Ambulatory Visit | Attending: Orthopedic Surgery | Admitting: Orthopedic Surgery

## 2020-08-24 ENCOUNTER — Other Ambulatory Visit: Payer: Self-pay

## 2020-08-24 DIAGNOSIS — M85832 Other specified disorders of bone density and structure, left forearm: Secondary | ICD-10-CM | POA: Diagnosis not present

## 2020-08-24 DIAGNOSIS — Z780 Asymptomatic menopausal state: Secondary | ICD-10-CM | POA: Diagnosis not present

## 2020-08-29 ENCOUNTER — Other Ambulatory Visit: Payer: Self-pay | Admitting: *Deleted

## 2020-08-29 DIAGNOSIS — F5101 Primary insomnia: Secondary | ICD-10-CM

## 2020-08-29 MED ORDER — MIRTAZAPINE 15 MG PO TABS: 15.0000 mg | ORAL_TABLET | Freq: Every day | ORAL | 1 refills | Status: DC

## 2020-08-29 NOTE — Telephone Encounter (Signed)
[facility] requested refill.  

## 2020-08-30 NOTE — Progress Notes (Signed)
NEUROLOGY FOLLOW UP OFFICE NOTE  Kathy Velez [id number]  Assessment/Plan:   1.  Mild vascular neurocognitive disorder 2.  Atrial fibrillation 3.  History of TIAs and left frontal lobe infarct, likely cardioembolic 4.  Cerebral amyloid angiopathy 5.  Hypertension 6.  Hyperlipidemia  1.  Secondary stroke prevention as per PCP/cardiology: - Eliquis - Statin (LDL goal less than 70) - Normotensive blood pressure - follow up with PCP - Glycemic control (Hgb A1c goal less than 7) 2.  When visiting vacation house, try to mirror usual routine, maintain same sleep schedule 3.  Follow up 6 months.  Subjective:  Kathy Velez is an 87 year old right-handed white female with paroxysmal atrial fibrillation on Eliquis, HTN, hypothyroidism, interstitial lung disease, rheumatoid arthritis and history of stroke and colon cancer who follows up for [facility].  She is accompanied by her daughter who supplements history.   UPDATE: Current medications:  Eliquis; digoxin; atorvastatin 20mg ; Toprol XL; valsartan   She is in independent living in [place].  They have transportation available to take her shopping.  Her daughter sets up her pillbox for her.  Her balance is still an issue.  She had handle bars installed in the shower and at the toilet.  Using her walker in the apartment.  She exercises routinely. She takes a class 5 days a week.  She uses an exercise machine for a mile.  Stopped donepezil due to causing leg cramps.  She notices slight worsening in short-term memory but not significant.  When she is out of her element, such as visiting with her family to their vacation house, she may be a little more confused.       HISTORY: On 04/20/2019, she presented to the [facility] ED after having word-finding difficulties and making paraphasic errors lasting a couple of hours.  She was slurring her words and wasn't able to answer basic questions.  No associated facial droop, numbness or weakness.  She has  some baseline diplopia but without change.  She had some cough but no fever or shortness of breath.  Labs were unremarkable, including CBC, CMP, TSH (2.309) and testing for flu and Covid.  CXR showed no active pulmonary disease.  EKG showed known atrial fibrillation but no acute ischemic cardiac event.  CT head without contrast showed chronic small vessel disease but no acute findings.  MRI of brain without contrast showed 2 punctate subacute infarcts in the left corona radiata and occipital pole subcortical white matter. Otherwise chronic small vessel ischemic changes with few scattered chronic microhemorrhages in the left occipital pole and central pons and small remote lacunar infarcts in right cerebellum.  No change was made to antiplatelet and anticoagulation therapy.  She is on ASA and Eliquis.  Several months prior, she had a severe left sided headache and noted vision loss on the left side of her vision while watching TV.  It lasted 2 hours.   She had another stroke in July 2021, while in the [facility], in which she had confusion, language difficulty and transient right facial droop.  She was transferred to [facility] on 09/27/2019.  MRI of brain showed an acute infarct in the left frontal corona radiata.  Again demonstrated multiple areas of remote microhemorrhages in the peripheral cerebral hemispheres as well as pons, suggesting hypertensive hemorrhage with underlying cerebral amyloid angiopathy.  CTA head and neck showed no large vessel intracranial occlusion or significant stenosis but did demonstrate atherosclerotic plaque in both ICAs  causing less than 50% stenosis on the right and 50% stenosis on the left.  Echocardiogram was negative for thrombus.  LDL 46.  HGB A1c 5.1.  Telemetry demonstrated atrial fibrillation.  Due to possible cerebral amyloid angiopathy, ASA was discontinued.  Since her stroke in July, she has had ongoing short-term memory problems.  On  03/28/2020, she developed sudden onset of right sided numbness and tingling involving her face, tongue and hand lasting about 20 minutes and resolved.  No associated weakness.  The next day, she was was seen by the nurse at [facility] who advised her to go the ED for evaluation.  CT head personally reviewed showed no acute intracranial abnormality.  Due to recent work up previously performed and mild sensory symptoms that have since resolved, she was discharged in stable condition with no change in management   She reports prior history of TIA with right sided weakness.   Previous studies: 11/27/2018 ECHOCARDIOGRAM:  EF 60-65% with no cardiac source of emboli 02/03/2019 LABS:  LDL 58; B12 1,559 03/06/2019 CAROTID DOPPLER:  Moderate heterogeneous and calcified plaque with no hemodynamically significant stenosis  Past medication:  donepezil (leg cramps)  PAST MEDICAL HISTORY: Past Medical History:  Diagnosis Date   Anemia    Anxiety    Arthritis    Back   Atrial fibrillation ([place])    Cancer ([place]) 2006   Colon.  Basal Cell Skin cancer- right arm   Colon cancer ([facility])    Constipation due to pain medication therapy    after heart surgery   Dysrhythmia    PAF   GERD (gastroesophageal reflux disease)    Heart murmur    Hypertension    Hypothyroidism    RA (rheumatoid arthritis) ([facility])    Restless leg    Seizures ([place])    after Heart Surgey   Stroke ([place])    TIA- found by neurologist after     MEDICATIONS: Current Outpatient Medications on File Prior to Visit  Medication Sig Dispense Refill   acetaminophen (TYLENOL) 500 MG tablet Take 1,000 mg by mouth 2 (two) times daily as needed for moderate pain.      apixaban (ELIQUIS) 2.5 MG TABS tablet Take 1 tablet (2.5 mg total) by mouth 2 (two) times daily. 180 tablet 3   atorvastatin (LIPITOR) 20 MG tablet TAKE 1 TABLET DAILY 90 tablet 3   Cholecalciferol (VITAMIN D) 50 MCG (2000 UT) CAPS Take 1 capsule by mouth daily.     Coenzyme Q10 (CO  Q 10) 60 MG CAPS Take 1 capsule by mouth daily.     digoxin (LANOXIN) 0.125 MG tablet TAKE 1 TABLET DAILY 90 tablet 3   donepezil (ARICEPT) 10 MG tablet Take 1 tablet (10 mg total) by mouth at bedtime. 30 tablet 5   ENBREL SURECLICK 50 MG/ML injection      leflunomide (ARAVA) 20 MG tablet Take 20 mg by mouth daily.     metoprolol succinate (TOPROL-XL) 50 MG 24 hr tablet TAKE 1 TABLET DAILY 90 tablet 3   mirtazapine (REMERON) 15 MG tablet Take 1 tablet (15 mg total) by mouth at bedtime. 90 tablet 1   Multiple Vitamins-Minerals (PRESERVISION AREDS 2) CAPS Take 1 Dose by mouth 2 (two) times daily. For eye health     omeprazole (PRILOSEC) 40 MG capsule Take 1 capsule (40 mg total) by mouth daily. 90 capsule 3   Psyllium (METAMUCIL MULTIHEALTH FIBER) 63 % POWD Take 1 Scoop by mouth in the morning. 660 g 0  SYNTHROID 75 MCG tablet TAKE 1 TABLET DAILY BEFORE BREAKFAST FOR HYPOTHYROIDISM 90 tablet 3   valsartan (DIOVAN) 80 MG tablet TAKE 1 TABLET DAILY 90 tablet 3   vitamin B-12 (CYANOCOBALAMIN) 1000 MCG tablet Take 1,000 mcg by mouth daily.      No current facility-administered medications on file prior to visit.    ALLERGIES: Allergies  Allergen Reactions   Codeine Other (See Comments)    "just don't take it well"   Molds & Smuts    Pollen Extract Swelling   Bee Venom Swelling and Rash    FAMILY HISTORY: Family History  Problem Relation Age of Onset   Lung disease Mother 59   Alcohol abuse Son    Heart disease Father 19      Objective:  Blood pressure (!) 177/83, pulse (!) 109, height 5\' 2"  (1.575 m), weight 120 lb (54.4 kg), SpO2 96 %. General: No acute distress.  Patient appears well-groomed.   Head:  Normocephalic/atraumatic Eyes:  Fundi examined but not visualized Neck: supple, no paraspinal tenderness, full range of motion Heart:  Regular rate and rhythm Lungs:  Clear to auscultation bilaterally Back: No paraspinal tenderness Neurological Exam: alert and oriented to  person, place, and time.  Speech fluent and not dysarthric, language intact.  CN II-XII intact. Bulk and tone normal, muscle strength 5-/5 hip flexion otherwise 5/5 throughout.  Sensation to light touch intact.  Deep tendon reflexes absent throughout.  Finger to nose testing intact.  Cautious wide-based gait.  Romberg negative.   Metta Clines, DO  CC:  Hollace Kinnier, DO  Veleta Miners, MD

## 2020-09-01 ENCOUNTER — Ambulatory Visit (INDEPENDENT_AMBULATORY_CARE_PROVIDER_SITE_OTHER): Payer: Medicare Other | Admitting: Neurology

## 2020-09-01 ENCOUNTER — Encounter: Payer: Self-pay | Admitting: Neurology

## 2020-09-01 ENCOUNTER — Other Ambulatory Visit: Payer: Self-pay

## 2020-09-01 VITALS — BP 177/83 | HR 109 | Ht 62.0 in | Wt 120.0 lb

## 2020-09-01 DIAGNOSIS — Z8673 Personal history of transient ischemic attack (TIA), and cerebral infarction without residual deficits: Secondary | ICD-10-CM | POA: Diagnosis not present

## 2020-09-01 DIAGNOSIS — F01A0 Vascular dementia, mild, without behavioral disturbance, psychotic disturbance, mood disturbance, and anxiety: Secondary | ICD-10-CM

## 2020-09-01 DIAGNOSIS — E782 Mixed hyperlipidemia: Secondary | ICD-10-CM

## 2020-09-01 DIAGNOSIS — I4821 Permanent atrial fibrillation: Secondary | ICD-10-CM | POA: Diagnosis not present

## 2020-09-01 DIAGNOSIS — F0150 Vascular dementia without behavioral disturbance: Secondary | ICD-10-CM | POA: Diagnosis not present

## 2020-09-01 DIAGNOSIS — I10 Essential (primary) hypertension: Secondary | ICD-10-CM

## 2020-09-01 NOTE — Patient Instructions (Signed)
Try to mirror normal routine as much as possible when you go the vacation house

## 2020-09-02 ENCOUNTER — Other Ambulatory Visit: Payer: Self-pay | Admitting: Adult Health

## 2020-09-02 DIAGNOSIS — R252 Cramp and spasm: Secondary | ICD-10-CM

## 2020-09-03 MED ORDER — GABAPENTIN 100 MG PO CAPS: 100.0000 mg | ORAL_CAPSULE | Freq: Every day | ORAL | 1 refills | Status: DC

## 2020-09-06 ENCOUNTER — Emergency Department (HOSPITAL_BASED_OUTPATIENT_CLINIC_OR_DEPARTMENT_OTHER): Payer: Medicare Other | Admitting: Radiology

## 2020-09-06 ENCOUNTER — Other Ambulatory Visit: Payer: Self-pay | Admitting: Internal Medicine

## 2020-09-06 ENCOUNTER — Emergency Department (HOSPITAL_BASED_OUTPATIENT_CLINIC_OR_DEPARTMENT_OTHER): Payer: Medicare Other

## 2020-09-06 ENCOUNTER — Inpatient Hospital Stay (HOSPITAL_BASED_OUTPATIENT_CLINIC_OR_DEPARTMENT_OTHER)
Admission: EM | Admit: 2020-09-06 | Discharge: 2020-09-08 | DRG: 125 | Disposition: A | Discharge status: Skilled Nursing Facility | Payer: Medicare Other | Attending: Internal Medicine | Admitting: Internal Medicine

## 2020-09-06 ENCOUNTER — Other Ambulatory Visit: Payer: Self-pay

## 2020-09-06 ENCOUNTER — Encounter (HOSPITAL_BASED_OUTPATIENT_CLINIC_OR_DEPARTMENT_OTHER): Payer: Self-pay | Admitting: Obstetrics and Gynecology

## 2020-09-06 DIAGNOSIS — Z8673 Personal history of transient ischemic attack (TIA), and cerebral infarction without residual deficits: Secondary | ICD-10-CM

## 2020-09-06 DIAGNOSIS — W19XXXA Unspecified fall, initial encounter: Secondary | ICD-10-CM

## 2020-09-06 DIAGNOSIS — Z888 Allergy status to other drugs, medicaments and biological substances status: Secondary | ICD-10-CM

## 2020-09-06 DIAGNOSIS — H532 Diplopia: Secondary | ICD-10-CM | POA: Diagnosis not present

## 2020-09-06 DIAGNOSIS — H05239 Hemorrhage of unspecified orbit: Secondary | ICD-10-CM

## 2020-09-06 DIAGNOSIS — Z20822 Contact with and (suspected) exposure to covid-19: Secondary | ICD-10-CM | POA: Diagnosis present

## 2020-09-06 DIAGNOSIS — I4892 Unspecified atrial flutter: Secondary | ICD-10-CM | POA: Diagnosis present

## 2020-09-06 DIAGNOSIS — W06XXXA Fall from bed, initial encounter: Secondary | ICD-10-CM | POA: Diagnosis present

## 2020-09-06 DIAGNOSIS — M85839 Other specified disorders of bone density and structure, unspecified forearm: Secondary | ICD-10-CM | POA: Diagnosis present

## 2020-09-06 DIAGNOSIS — Z836 Family history of other diseases of the respiratory system: Secondary | ICD-10-CM | POA: Diagnosis not present

## 2020-09-06 DIAGNOSIS — Z8249 Family history of ischemic heart disease and other diseases of the circulatory system: Secondary | ICD-10-CM

## 2020-09-06 DIAGNOSIS — M19041 Primary osteoarthritis, right hand: Secondary | ICD-10-CM | POA: Diagnosis not present

## 2020-09-06 DIAGNOSIS — E782 Mixed hyperlipidemia: Secondary | ICD-10-CM | POA: Diagnosis present

## 2020-09-06 DIAGNOSIS — Z85038 Personal history of other malignant neoplasm of large intestine: Secondary | ICD-10-CM

## 2020-09-06 DIAGNOSIS — D638 Anemia in other chronic diseases classified elsewhere: Secondary | ICD-10-CM | POA: Diagnosis present

## 2020-09-06 DIAGNOSIS — Z811 Family history of alcohol abuse and dependence: Secondary | ICD-10-CM | POA: Diagnosis not present

## 2020-09-06 DIAGNOSIS — J984 Other disorders of lung: Secondary | ICD-10-CM | POA: Diagnosis not present

## 2020-09-06 DIAGNOSIS — G2581 Restless legs syndrome: Secondary | ICD-10-CM | POA: Diagnosis present

## 2020-09-06 DIAGNOSIS — S0990XA Unspecified injury of head, initial encounter: Secondary | ICD-10-CM | POA: Diagnosis not present

## 2020-09-06 DIAGNOSIS — Y92122 Bedroom in nursing home as the place of occurrence of the external cause: Secondary | ICD-10-CM | POA: Diagnosis not present

## 2020-09-06 DIAGNOSIS — K219 Gastro-esophageal reflux disease without esophagitis: Secondary | ICD-10-CM | POA: Diagnosis present

## 2020-09-06 DIAGNOSIS — S0512XA Contusion of eyeball and orbital tissues, left eye, initial encounter: Secondary | ICD-10-CM | POA: Diagnosis not present

## 2020-09-06 DIAGNOSIS — Z7901 Long term (current) use of anticoagulants: Secondary | ICD-10-CM

## 2020-09-06 DIAGNOSIS — S0003XA Contusion of scalp, initial encounter: Secondary | ICD-10-CM | POA: Diagnosis not present

## 2020-09-06 DIAGNOSIS — Z981 Arthrodesis status: Secondary | ICD-10-CM

## 2020-09-06 DIAGNOSIS — M79641 Pain in right hand: Secondary | ICD-10-CM | POA: Diagnosis not present

## 2020-09-06 DIAGNOSIS — Z91030 Bee allergy status: Secondary | ICD-10-CM

## 2020-09-06 DIAGNOSIS — S0285XA Fracture of orbit, unspecified, initial encounter for closed fracture: Secondary | ICD-10-CM

## 2020-09-06 DIAGNOSIS — S199XXA Unspecified injury of neck, initial encounter: Secondary | ICD-10-CM | POA: Diagnosis not present

## 2020-09-06 DIAGNOSIS — J849 Interstitial pulmonary disease, unspecified: Secondary | ICD-10-CM | POA: Diagnosis present

## 2020-09-06 DIAGNOSIS — S022XXA Fracture of nasal bones, initial encounter for closed fracture: Secondary | ICD-10-CM | POA: Diagnosis present

## 2020-09-06 DIAGNOSIS — I10 Essential (primary) hypertension: Secondary | ICD-10-CM | POA: Diagnosis present

## 2020-09-06 DIAGNOSIS — H05231 Hemorrhage of right orbit: Secondary | ICD-10-CM | POA: Diagnosis present

## 2020-09-06 DIAGNOSIS — Z79899 Other long term (current) drug therapy: Secondary | ICD-10-CM

## 2020-09-06 DIAGNOSIS — E039 Hypothyroidism, unspecified: Secondary | ICD-10-CM | POA: Diagnosis present

## 2020-09-06 DIAGNOSIS — I480 Paroxysmal atrial fibrillation: Secondary | ICD-10-CM | POA: Diagnosis present

## 2020-09-06 DIAGNOSIS — S0231XA Fracture of orbital floor, right side, initial encounter for closed fracture: Secondary | ICD-10-CM | POA: Diagnosis present

## 2020-09-06 DIAGNOSIS — Z85828 Personal history of other malignant neoplasm of skin: Secondary | ICD-10-CM

## 2020-09-06 DIAGNOSIS — Z9889 Other specified postprocedural states: Secondary | ICD-10-CM

## 2020-09-06 DIAGNOSIS — Z66 Do not resuscitate: Secondary | ICD-10-CM | POA: Diagnosis present

## 2020-09-06 DIAGNOSIS — G3184 Mild cognitive impairment, so stated: Secondary | ICD-10-CM | POA: Diagnosis present

## 2020-09-06 DIAGNOSIS — Z043 Encounter for examination and observation following other accident: Secondary | ICD-10-CM | POA: Diagnosis not present

## 2020-09-06 DIAGNOSIS — Z885 Allergy status to narcotic agent status: Secondary | ICD-10-CM

## 2020-09-06 DIAGNOSIS — M069 Rheumatoid arthritis, unspecified: Secondary | ICD-10-CM | POA: Diagnosis not present

## 2020-09-06 DIAGNOSIS — D68318 Other hemorrhagic disorder due to intrinsic circulating anticoagulants, antibodies, or inhibitors: Secondary | ICD-10-CM | POA: Diagnosis not present

## 2020-09-06 LAB — URINALYSIS, ROUTINE W REFLEX MICROSCOPIC
Bilirubin Urine: NEGATIVE
Glucose, UA: NEGATIVE mg/dL
Hgb urine dipstick: NEGATIVE
Ketones, ur: NEGATIVE mg/dL
Leukocytes,Ua: NEGATIVE
Nitrite: NEGATIVE
Protein, ur: NEGATIVE mg/dL
Specific Gravity, Urine: 1.006 (ref 1.005–1.030)
pH: 6 (ref 5.0–8.0)

## 2020-09-06 LAB — CBC
HCT: 30.6 % — ABNORMAL LOW (ref 36.0–46.0)
Hemoglobin: 10.3 g/dL — ABNORMAL LOW (ref 12.0–15.0)
MCH: 34.7 pg — ABNORMAL HIGH (ref 26.0–34.0)
MCHC: 33.7 g/dL (ref 30.0–36.0)
MCV: 103 fL — ABNORMAL HIGH (ref 80.0–100.0)
Platelets: 172 10*3/uL (ref 150–400)
RBC: 2.97 MIL/uL — ABNORMAL LOW (ref 3.87–5.11)
RDW: 14.7 % (ref 11.5–15.5)
WBC: 10.1 10*3/uL (ref 4.0–10.5)
nRBC: 0 % (ref 0.0–0.2)

## 2020-09-06 LAB — BASIC METABOLIC PANEL
Anion gap: 8 (ref 5–15)
BUN: 19 mg/dL (ref 8–23)
CO2: 25 mmol/L (ref 22–32)
Calcium: 9.2 mg/dL (ref 8.9–10.3)
Chloride: 102 mmol/L (ref 98–111)
Creatinine, Ser: 0.75 mg/dL (ref 0.44–1.00)
GFR, Estimated: >60 mL/min (ref >60)
Glucose, Bld: 114 mg/dL — ABNORMAL HIGH (ref 70–99)
Potassium: 4 mmol/L (ref 3.5–5.1)
Sodium: 135 mmol/L (ref 135–145)

## 2020-09-06 LAB — RESP PANEL BY RT-PCR (FLU A&B, COVID) ARPGX2
Influenza A by PCR: NEGATIVE
Influenza B by PCR: NEGATIVE
SARS Coronavirus 2 by RT PCR: NEGATIVE

## 2020-09-06 MED ORDER — PANTOPRAZOLE SODIUM 40 MG PO TBEC
40.0000 mg | DELAYED_RELEASE_TABLET | Freq: Every day | ORAL | Status: DC
  Administered 2020-09-07 – 2020-09-08 (×2): 40 mg via ORAL
  Filled 2020-09-06 (×2): qty 1

## 2020-09-06 MED ORDER — ATORVASTATIN CALCIUM 10 MG PO TABS
20.0000 mg | ORAL_TABLET | Freq: Every day | ORAL | Status: DC
  Administered 2020-09-06 – 2020-09-08 (×3): 20 mg via ORAL
  Filled 2020-09-06 (×4): qty 2

## 2020-09-06 MED ORDER — FENTANYL CITRATE (PF) 100 MCG/2ML IJ SOLN
12.5000 ug | Freq: Once | INTRAMUSCULAR | Status: AC
  Administered 2020-09-06: 12.5 ug via INTRAVENOUS
  Filled 2020-09-06: qty 2

## 2020-09-06 MED ORDER — TETRACAINE HCL 0.5 % OP SOLN
OPHTHALMIC | Status: AC
  Administered 2020-09-06: 2 [drp] via OPHTHALMIC
  Filled 2020-09-06: qty 4

## 2020-09-06 MED ORDER — PROTHROMBIN COMPLEX CONC HUMAN 500 UNITS IV KIT
2638.0000 [IU] | PACK | INTRAVENOUS | Status: AC
  Administered 2020-09-06: 2638 [IU] via INTRAVENOUS
  Filled 2020-09-06: qty 2638

## 2020-09-06 MED ORDER — FENTANYL CITRATE (PF) 100 MCG/2ML IJ SOLN
12.5000 ug | INTRAMUSCULAR | Status: DC | PRN
  Administered 2020-09-06: 12.5 ug via INTRAVENOUS
  Filled 2020-09-06: qty 2

## 2020-09-06 MED ORDER — DIGOXIN 125 MCG PO TABS
125.0000 ug | ORAL_TABLET | Freq: Every day | ORAL | Status: DC
  Administered 2020-09-07 – 2020-09-08 (×2): 125 ug via ORAL
  Filled 2020-09-06 (×2): qty 1

## 2020-09-06 MED ORDER — VITAMIN B-12 1000 MCG PO TABS
1000.0000 ug | ORAL_TABLET | Freq: Every day | ORAL | Status: DC
  Administered 2020-09-07 – 2020-09-08 (×2): 1000 ug via ORAL
  Filled 2020-09-06 (×2): qty 1

## 2020-09-06 MED ORDER — PROTHROMBIN COMPLEX CONC HUMAN 500 UNITS IV KIT
PACK | INTRAVENOUS | Status: AC
  Filled 2020-09-06: qty 2500

## 2020-09-06 MED ORDER — METOPROLOL SUCCINATE ER 25 MG PO TB24
50.0000 mg | ORAL_TABLET | Freq: Every day | ORAL | Status: DC
  Administered 2020-09-07 – 2020-09-08 (×2): 50 mg via ORAL
  Filled 2020-09-06 (×3): qty 2

## 2020-09-06 MED ORDER — IRBESARTAN 75 MG PO TABS
75.0000 mg | ORAL_TABLET | Freq: Every day | ORAL | Status: DC
  Administered 2020-09-07 – 2020-09-08 (×2): 75 mg via ORAL
  Filled 2020-09-06 (×2): qty 1

## 2020-09-06 MED ORDER — MIRTAZAPINE 15 MG PO TABS
15.0000 mg | ORAL_TABLET | Freq: Every day | ORAL | Status: DC
  Administered 2020-09-06 – 2020-09-07 (×2): 15 mg via ORAL
  Filled 2020-09-06 (×2): qty 1

## 2020-09-06 MED ORDER — ACETAMINOPHEN 500 MG PO TABS
500.0000 mg | ORAL_TABLET | Freq: Once | ORAL | Status: AC
  Administered 2020-09-06: 500 mg via ORAL
  Filled 2020-09-06: qty 1

## 2020-09-06 MED ORDER — TETRACAINE HCL 0.5 % OP SOLN
2.0000 [drp] | Freq: Once | OPHTHALMIC | Status: AC
  Filled 2020-09-06: qty 4

## 2020-09-06 MED ORDER — LEVOTHYROXINE SODIUM 75 MCG PO TABS
75.0000 ug | ORAL_TABLET | Freq: Every day | ORAL | Status: DC
  Administered 2020-09-07 – 2020-09-08 (×2): 75 ug via ORAL
  Filled 2020-09-06 (×2): qty 1

## 2020-09-06 MED ORDER — DIGOXIN 125 MCG PO TABS
0.1250 mg | ORAL_TABLET | Freq: Once | ORAL | Status: AC
  Administered 2020-09-06: 0.125 mg via ORAL
  Filled 2020-09-06: qty 1

## 2020-09-06 NOTE — ED Notes (Signed)
  MD able to look at medications and ordered pertinent medications.  Daughter given update and requested business card of Chief Financial Officer.  Card given and daughter updated that [facility] en route to pick up patient.

## 2020-09-06 NOTE — Progress Notes (Signed)
Received a phone call from Facility: [facility]  Requesting MD: Dr. Jeanell Sparrow Patient with h/o HTN, atrial fibrillation on eliquis, IDA, hypothyroidism, RA, HLD, hx of CVA with mild cognitive impairment presenting with fall off the side of the bed. She stared neurontin last night and when she woke up she felt like she lost her balance and fell onto her face. CT maxillofacial shows right periorbital soft tissue contusion with acute mildly displaced right orbital floor fracture and adjacent extraconal retrobulbar hematoma and right maxillary hemosinus. Small amount of herniated fat through the defect. Fracture extends across the infraorbital foramen. Slightly displaced right nasal bone fracture with overlying nasal contusion.  Plan of care: [facility] to [facility], trauma to see and will consult optho/ENT. Hgb stable. Covid pending.  The patient will be accepted for admission to telemetry at [facility] when bed is available.  Trauma has been called and will see her, but would call on arrival. Will also need an opthalmology and ENT consult by admitting physician.    Nursing staff, Please call the [place] number at the top of Amion at the time of the patient's arrival so that the patient can be paged to the admitting physician.   Casimer Bilis, MD [facility]

## 2020-09-06 NOTE — ED Notes (Signed)
Patient transported to CT 

## 2020-09-06 NOTE — Consult Note (Signed)
[facility] Admission Note  Kathy Velez 09-Mar-1932  [id number].    Requesting MD: Pattricia Boss Chief Complaint/Reason for Consult: fall  HPI:  Kathy Velez is an 87yo female with multiple medical problems including paroxysmal atrial fibrillation on eliquis (she is not sure when her last dose of Eliquis was as her daughter manages her medications) who was transferred from [place] to [facility] after suffering a ground level fall.  She states on 6/12 PM she took Neurontin for the first time. She awoke yesterday morning was sitting on the edge of the bed when she lost her balance and fell forward hitting her face.  She does not think that she lost consciousness. She was able to get up on her own and call for help. She is currently complaining of right facial pain and right hand pain. Notes right hand pain has been present before the fall and attributes this to her RA. She denies any HA, visual changes, midline neck pain, back pain, chest pain, chest injury, shortness of breath, abdominal pain, or other extremity pain.  Work up included CT head, maxillofacial, c-spine, and xray of the chest, pelvis, and right hand. Found to have right orbital floor fracture with herniated fat and retrobulbar hematoma, as well as a right nasal bone fracture.  Patient was admitted to the medical service. Eliquis was reversed with KCentra. Trauma asked to see in consult. Patient notes since admission she has been able to ambulate to the bathroom with assistance of the RN and a walker without difficulty. She uses a walker at baseline. She is tolerating a diet without abdominal pain, n/v. She is voiding without difficulty. Her current pain level is mild.   Lives in independent living at [facility]  Review of Systems  All other systems reviewed and are negative. Please see HPI. Otherwise all systems reviewed and otherwise negative except for as above  Family History  Problem Relation Age of Onset    Lung disease Mother 37   Alcohol abuse Son    Heart disease Father 22    Past Medical History:  Diagnosis Date   Anemia    Anxiety    Arthritis    Back   Atrial fibrillation ([facility])    Cancer ([facility]) 2006   Colon.  Basal Cell Skin cancer- right arm   Colon cancer ([facility])    Constipation due to pain medication therapy    after heart surgery   Dysrhythmia    PAF   GERD (gastroesophageal reflux disease)    Heart murmur    Hypertension    Hypothyroidism    RA (rheumatoid arthritis) ([facility])    Restless leg    Seizures ([facility])    after Heart Surgey   Stroke ([facility])    TIA- found by neurologist after     Past Surgical History:  Procedure Laterality Date   ABDOMINAL HYSTERECTOMY  1970   Partial    COLON RESECTION  2006   cancer   COLONOSCOPY     EYE SURGERY Bilateral    Cataract   MAXIMUM ACCESS (MAS)POSTERIOR LUMBAR INTERBODY FUSION (PLIF) 2 LEVEL N/A 04/12/2015   Procedure: Lumbar Three-Five Decompression, Pedicle Screw Fixation, and Posteriolateral Arthrodesis;  Surgeon: Erline Levine, MD;  Location: [facility];  Service: Neurosurgery;  Laterality: N/A;  L3-4 L4-5 Maximum access posterior lumbar fusion, possible interbodies and resection of synovial cyst at L4-5   MITRAL VALVE REPAIR  01/20/13   Gore-tex cords to P1, P2, and P3. Magic suture to posterior  medial commisure, #30 Physio 1 ring. Done in [place]   TONSILLECTOMY     about [place]  01/20/13   #28 TriAd ring done in [place]    Social History:  reports that she has never smoked. She has never used smokeless tobacco. She reports current alcohol use of about 1.0 standard drink of alcohol per week. She reports that she does not use drugs.  Allergies:  Allergies  Allergen Reactions   Codeine Other (See Comments)    "just don't take it well"   Molds & Smuts    Pollen Extract Swelling   Bee Venom Swelling and Rash    (Not in a hospital admission)   Prior to Admission medications   Medication  Sig Start Date End Date Taking? Authorizing Provider  acetaminophen (TYLENOL) 500 MG tablet Take 1,000 mg by mouth 2 (two) times daily as needed for moderate pain.    Yes [provider]  apixaban (ELIQUIS) 2.5 MG TABS tablet Take 1 tablet (2.5 mg total) by mouth 2 (two) times daily. 04/13/20  Yes Reed, Tiffany L, DO  atorvastatin (LIPITOR) 20 MG tablet TAKE 1 TABLET DAILY 06/16/20  Yes Royal Hawthorn, NP  Cholecalciferol (VITAMIN D) 50 MCG (2000 UT) CAPS Take 1 capsule by mouth daily.   Yes [provider]  digoxin (LANOXIN) 0.125 MG tablet TAKE 1 TABLET DAILY 06/16/20  Yes Royal Hawthorn, NP  ENBREL SURECLICK 50 MG/ML injection  03/06/18  Yes [provider]  gabapentin (NEURONTIN) 100 MG capsule Take 1 capsule (100 mg total) by mouth at bedtime. Try one capsule at bedtime, if no improvement after 1 week can try two capsules at bedtime. 09/03/20 09/03/21 Yes Wert, Margreta Journey, NP  leflunomide (ARAVA) 20 MG tablet Take 20 mg by mouth daily. 10/05/19  Yes [provider]  metoprolol succinate (TOPROL-XL) 50 MG 24 hr tablet TAKE 1 TABLET DAILY 06/16/20  Yes Royal Hawthorn, NP  mirtazapine (REMERON) 15 MG tablet Take 1 tablet (15 mg total) by mouth at bedtime. 08/29/20  Yes Virgie Dad, MD  Multiple Vitamins-Minerals (PRESERVISION AREDS 2) CAPS Take 1 Dose by mouth 2 (two) times daily. For eye health   Yes [provider]  omeprazole (PRILOSEC) 40 MG capsule Take 1 capsule (40 mg total) by mouth daily. 08/28/19  Yes Reed, Tiffany L, DO  Psyllium (METAMUCIL MULTIHEALTH FIBER) 63 % POWD Take 1 Scoop by mouth in the morning. 06/08/20  Yes Reed, Tiffany L, DO  SYNTHROID 75 MCG tablet TAKE 1 TABLET DAILY BEFORE BREAKFAST FOR HYPOTHYROIDISM 07/27/19  Yes Reed, Tiffany L, DO  valsartan (DIOVAN) 80 MG tablet TAKE 1 TABLET DAILY 03/21/20  Yes Reed, Tiffany L, DO  vitamin B-12 (CYANOCOBALAMIN) 1000 MCG tablet Take 1,000 mcg by mouth daily.    Yes [provider]   Coenzyme Q10 (CO Q 10) 60 MG CAPS Take 1 capsule by mouth daily. Patient not taking: Reported on 09/06/2020    [provider]    Blood pressure (!) 157/77, pulse 90, temperature 98 F (36.7 C), resp. rate 18, SpO2 95 %. Physical Exam: General: pleasant, WD/WN female who is sitting up in chair in NAD HEENT: Periorbital and nasal ecchymosis noted b/l.  Sclera are noninjected.  PERRL. EOMI without entrapment.  Ears without any masses or lesions. Nares appear patent and there is no obvious septal hematoma. Mouth is pink and moist. Dentition fair Neck: No c-spine tenderness or step offs. Normal rom. Supple. Trachea midline. No stridor.  Heart: regular, rate, and rhythm.  Normal s1,s2. No obvious gallops, or rubs noted.  Palpable radial and pedal pulses bilaterally  Lungs: CTAB, no wheezes, rhonchi, or rales noted.  Respiratory effort nonlabored Abd: Mild lower abdominal distension that patient reports is her baseline. Her abdomen is very soft and NT to light and deep palpation in all quadrants. +BS. No masses, hernias, or organomegaly Skin: Ecchymosis as noted above. Otherwise warm and dry with no masses, lesions, or rashes Psych: A&Ox4 with an appropriate affect Neuro: cranial nerves grossly intact, normal speech, thought process intact, gait not assessed, moves all extremities.  Msk:  RUE: Chronic RA like changes to the right hand without other gross abnormalities. She notes tenderness over 1st and 2nd mcp. Otherwise non-tender. Able rom of all digits without pain. Able passive/active shoulder, elbow and wrist range of motion without pain.  No tenderness over shoulder, upper arm, elbow, forearm or wrists. Radial 2+.  LUE: Chronic RA like changes to the left hand without other gross abnormalities. Able passive/active shoulder, elbow, wrist and hand range of motion without pain.  No tenderness over shoulder, upper arm, elbow, forearm, wrists or hand. Radial 2+.  RLE: No sacral crepitus.   Negative logroll test. Able passive/active range of motion of hip, knee and ankle without pain.  No tenderness over hip, upper legs, knee, lower leg, ankle or feet.  No lower extremity edema.  No calf tenderness.   LLE: No sacral crepitus.  Negative logroll test. Able passive/active range of motion of hip, knee and ankle without pain.  No tenderness over hip, upper legs, knee, lower leg, ankle or feet.  No lower extremity edema.  No calf tenderness.     Back: No midline cervical, thoracic or lumbar tenderness or step-offs.    Results for orders placed or performed during the hospital encounter of 09/06/20 (from the past 48 hour(s))  Urinalysis, Routine w reflex microscopic Urine, Clean Catch     Status: Abnormal   Collection Time: 09/06/20 10:42 AM  Result Value Ref Range   Color, Urine COLORLESS (A) YELLOW   APPearance CLEAR CLEAR   Specific Gravity, Urine 1.006 1.005 - 1.030   pH 6.0 5.0 - 8.0   Glucose, UA NEGATIVE NEGATIVE mg/dL   Hgb urine dipstick NEGATIVE NEGATIVE   Bilirubin Urine NEGATIVE NEGATIVE   Ketones, ur NEGATIVE NEGATIVE mg/dL   Protein, ur NEGATIVE NEGATIVE mg/dL   Nitrite NEGATIVE NEGATIVE   Leukocytes,Ua NEGATIVE NEGATIVE    Comment: Performed at [facility], [place], [place] [address]  Basic metabolic panel     Status: Abnormal   Collection Time: 09/06/20 11:27 AM  Result Value Ref Range   Sodium 135 135 - 145 mmol/L   Potassium 4.0 3.5 - 5.1 mmol/L   Chloride 102 98 - 111 mmol/L   CO2 25 22 - 32 mmol/L   Glucose, Bld 114 (H) 70 - 99 mg/dL    Comment: Glucose reference range applies only to samples taken after fasting for at least 8 hours.   BUN 19 8 - 23 mg/dL   Creatinine, Ser 0.75 0.44 - 1.00 mg/dL   Calcium 9.2 8.9 - 10.3 mg/dL   GFR, Estimated >60 >60 mL/min    Comment: (NOTE) Calculated using the CKD-EPI Creatinine Equation (2021)    Anion gap 8 5 - 15    Comment: Performed at [facility], [address] [place],  [address]  CBC     Status: Abnormal  Collection Time: 09/06/20 11:27 AM  Result Value Ref Range   WBC 10.1 4.0 - 10.5 K/uL   RBC 2.97 (L) 3.87 - 5.11 MIL/uL   Hemoglobin 10.3 (L) 12.0 - 15.0 g/dL   HCT 30.6 (L) 36.0 - 46.0 %   MCV 103.0 (H) 80.0 - 100.0 fL   MCH 34.7 (H) 26.0 - 34.0 pg   MCHC 33.7 30.0 - 36.0 g/dL   RDW 14.7 11.5 - 15.5 %   Platelets 172 150 - 400 K/uL   nRBC 0.0 0.0 - 0.2 %    Comment: Performed at [facility], [address], [place], [place] [address]  Resp Panel by RT-PCR (Flu A&B, Covid) Nasopharyngeal Swab     Status: None   Collection Time: 09/06/20  1:17 PM   Specimen: Nasopharyngeal Swab; Nasopharyngeal(NP) swabs in vial transport medium  Result Value Ref Range   SARS Coronavirus 2 by RT PCR NEGATIVE NEGATIVE    Comment: (NOTE) SARS-CoV-2 target nucleic acids are NOT DETECTED.  The SARS-CoV-2 RNA is generally detectable in upper respiratory specimens during the acute phase of infection. The lowest concentration of SARS-CoV-2 viral copies this assay can detect is 138 copies/mL. A negative result does not preclude SARS-Cov-2 infection and should not be used as the sole basis for treatment or other patient management decisions. A negative result may occur with  improper specimen collection/handling, submission of specimen other than nasopharyngeal swab, presence of viral mutation(s) within the areas targeted by this assay, and inadequate number of viral copies(<138 copies/mL). A negative result must be combined with clinical observations, patient history, and epidemiological information. The expected result is Negative.  Fact Sheet for Patients:  EntrepreneurPulse.com.au  Fact Sheet for Healthcare Providers:  IncredibleEmployment.be  This test is no t yet approved or cleared by the [place] FDA and  has been authorized for detection and/or diagnosis of  SARS-CoV-2 by FDA under an Emergency Use Authorization (EUA). This EUA will remain  in effect (meaning this test can be used) for the duration of the COVID-19 declaration under Section 564(b)(1) of the Act, 21 U.S.C.section 360bbb-3(b)(1), unless the authorization is terminated  or revoked sooner.       Influenza A by PCR NEGATIVE NEGATIVE   Influenza B by PCR NEGATIVE NEGATIVE    Comment: (NOTE) The Xpert Xpress SARS-CoV-2/FLU/RSV plus assay is intended as an aid in the diagnosis of influenza from Nasopharyngeal swab specimens and should not be used as a sole basis for treatment. Nasal washings and aspirates are unacceptable for Xpert Xpress SARS-CoV-2/FLU/RSV testing.  Fact Sheet for Patients: EntrepreneurPulse.com.au  Fact Sheet for Healthcare Providers: IncredibleEmployment.be  This test is not yet approved or cleared by the [place] FDA and has been authorized for detection and/or diagnosis of SARS-CoV-2 by FDA under an Emergency Use Authorization (EUA). This EUA will remain in effect (meaning this test can be used) for the duration of the COVID-19 declaration under Section 564(b)(1) of the Act, 21 U.S.C. section 360bbb-3(b)(1), unless the authorization is terminated or revoked.  Performed at [facility], [address] [place], [place] [address]    CT Head Wo Contrast  Result Date: 09/06/2020 CLINICAL DATA:  Trauma. EXAM: CT HEAD WITHOUT CONTRAST CT MAXILLOFACIAL WITHOUT CONTRAST CT CERVICAL SPINE WITHOUT CONTRAST TECHNIQUE: Multidetector CT imaging of the head, cervical spine, and maxillofacial structures were performed using the standard protocol without intravenous contrast. Multiplanar CT image reconstructions of the cervical spine and maxillofacial structures were also generated. COMPARISON:  CTA neck March  26 21.  CT head 03/29/2020. FINDINGS: CT HEAD FINDINGS Brain: No evidence of acute infarction,  hemorrhage, hydrocephalus, extra-axial collection or mass lesion/mass effect. Similar mild for age patchy white matter hypoattenuation, compatible with the sequela of chronic microvascular ischemic disease. Similar mild for age atrophy. Vascular: Calcific atherosclerosis. No hyperdense vessel identified. Skull: Frontal scalp contusion/laceration without acute fracture. Other: No mastoid effusions. CT MAXILLOFACIAL FINDINGS Osseous: Right orbital floor fracture is described below. Slightly displaced right nasal bone fracture. Bilateral TMJ joints are located. Left greater than right TMJ degenerative change. Orbits: Acute right orbital floor fracture with adjacent extraconal retrobulbar hematoma. Small amount of fat herniates through the defect. The inferior rectus is separate from the fracture. The fracture extends across the infraorbital foramen. No proptosis. The globes are symmetric and within normal limits. Unremarkable appearance of the optic nerves and extraocular muscles. Sinuses: Right maxillary hemosinus. Scattered mucosal thickening of ethmoid air cells. Soft tissues: Right periorbital and nasal soft tissue contusions. CT CERVICAL SPINE FINDINGS Alignment: Prominent upper cervical kyphosis with anterolisthesis of C2 on C3 and C3 on C4, favored degenerative/chronic given similar alignment on prior CTA neck 2021. Skull base and vertebrae: No acute fracture. No primary bone lesion or focal pathologic process. Soft tissues and spinal canal: No prevertebral fluid or swelling. No visible canal hematoma. Disc levels: Advanced cervical degenerative change at C2-C3, C3-C4, C4-C5, and C5-C6. Degenerative disc disease is greatest on the right at C5-C6 where there is disc height loss, endplate sclerosis and spurring. Bony fusion across the right C3-C4 facet joint. Multilevel posterior disc osteophyte complexes and facet/uncovertebral hypertrophy with varying degrees of canal and foraminal stenosis. Upper chest:  Biapical pleuroparenchymal scarring with areas of calcification. Other: Calcific atherosclerosis. IMPRESSION: CT head: 1. No evidence of acute intracranial abnormality. Similar mild for age atrophy and chronic microvascular ischemic disease. 2. Frontal scalp contusion/laceration without acute calvarial fracture. CT maxillofacial: 1. Right periorbital soft tissue contusion with acute mildly displaced right orbital floor fracture and adjacent extraconal retrobulbar hematoma and right maxillary hemosinus. Small amount of herniated fat through the defect. The fracture extends across the infraorbital foramen. 2. Slightly displaced right nasal bone fracture with overlying nasal contusion. 3. Left greater than right TMJ degenerative change. CT cervical spine: 1. No evidence of acute fracture. 2. Prominent upper cervical kyphosis with anterolisthesis of C2 on C3 and C3 on C4, favored degenerative/chronic given similar alignment on prior CTA neck 2021. 3. Advanced upper cervical degenerative change with multilevel spinal and foraminal stenosis. An MRI could further evaluate the canal and foramina clinically indicated. Electronically Signed   By: Margaretha Sheffield MD   On: 09/06/2020 12:39   CT Cervical Spine Wo Contrast  Result Date: 09/06/2020 CLINICAL DATA:  Trauma. EXAM: CT HEAD WITHOUT CONTRAST CT MAXILLOFACIAL WITHOUT CONTRAST CT CERVICAL SPINE WITHOUT CONTRAST TECHNIQUE: Multidetector CT imaging of the head, cervical spine, and maxillofacial structures were performed using the standard protocol without intravenous contrast. Multiplanar CT image reconstructions of the cervical spine and maxillofacial structures were also generated. COMPARISON:  CTA neck March 26 21.  CT head 03/29/2020. FINDINGS: CT HEAD FINDINGS Brain: No evidence of acute infarction, hemorrhage, hydrocephalus, extra-axial collection or mass lesion/mass effect. Similar mild for age patchy white matter hypoattenuation, compatible with the sequela of  chronic microvascular ischemic disease. Similar mild for age atrophy. Vascular: Calcific atherosclerosis. No hyperdense vessel identified. Skull: Frontal scalp contusion/laceration without acute fracture. Other: No mastoid effusions. CT MAXILLOFACIAL FINDINGS Osseous: Right orbital floor fracture is described below. Slightly displaced right  nasal bone fracture. Bilateral TMJ joints are located. Left greater than right TMJ degenerative change. Orbits: Acute right orbital floor fracture with adjacent extraconal retrobulbar hematoma. Small amount of fat herniates through the defect. The inferior rectus is separate from the fracture. The fracture extends across the infraorbital foramen. No proptosis. The globes are symmetric and within normal limits. Unremarkable appearance of the optic nerves and extraocular muscles. Sinuses: Right maxillary hemosinus. Scattered mucosal thickening of ethmoid air cells. Soft tissues: Right periorbital and nasal soft tissue contusions. CT CERVICAL SPINE FINDINGS Alignment: Prominent upper cervical kyphosis with anterolisthesis of C2 on C3 and C3 on C4, favored degenerative/chronic given similar alignment on prior CTA neck 2021. Skull base and vertebrae: No acute fracture. No primary bone lesion or focal pathologic process. Soft tissues and spinal canal: No prevertebral fluid or swelling. No visible canal hematoma. Disc levels: Advanced cervical degenerative change at C2-C3, C3-C4, C4-C5, and C5-C6. Degenerative disc disease is greatest on the right at C5-C6 where there is disc height loss, endplate sclerosis and spurring. Bony fusion across the right C3-C4 facet joint. Multilevel posterior disc osteophyte complexes and facet/uncovertebral hypertrophy with varying degrees of canal and foraminal stenosis. Upper chest: Biapical pleuroparenchymal scarring with areas of calcification. Other: Calcific atherosclerosis. IMPRESSION: CT head: 1. No evidence of acute intracranial abnormality.  Similar mild for age atrophy and chronic microvascular ischemic disease. 2. Frontal scalp contusion/laceration without acute calvarial fracture. CT maxillofacial: 1. Right periorbital soft tissue contusion with acute mildly displaced right orbital floor fracture and adjacent extraconal retrobulbar hematoma and right maxillary hemosinus. Small amount of herniated fat through the defect. The fracture extends across the infraorbital foramen. 2. Slightly displaced right nasal bone fracture with overlying nasal contusion. 3. Left greater than right TMJ degenerative change. CT cervical spine: 1. No evidence of acute fracture. 2. Prominent upper cervical kyphosis with anterolisthesis of C2 on C3 and C3 on C4, favored degenerative/chronic given similar alignment on prior CTA neck 2021. 3. Advanced upper cervical degenerative change with multilevel spinal and foraminal stenosis. An MRI could further evaluate the canal and foramina clinically indicated. Electronically Signed   By: Margaretha Sheffield MD   On: 09/06/2020 12:39   DG Pelvis Portable  Result Date: 09/06/2020 CLINICAL DATA:  Fall. EXAM: PORTABLE PELVIS 1-2 VIEWS COMPARISON:  CT abdomen pelvis dated October 27, 2018. FINDINGS: There is no evidence of pelvic fracture or diastasis. No pelvic bone lesions are seen. Prior lumbar fusion. IMPRESSION: Negative. Electronically Signed   By: Titus Dubin M.D.   On: 09/06/2020 13:35   DG Chest Port 1 View  Result Date: 09/06/2020 CLINICAL DATA:  Fall.  No chest complaints. EXAM: PORTABLE CHEST 1 VIEW COMPARISON:  Chest x-ray dated March 31, 2020. FINDINGS: The heart size and mediastinal contours are within normal limits. Status post mitral and tricuspid valve replacements. Normal pulmonary vascularity. Mild biapical scarring again noted. No focal consolidation, pleural effusion, or pneumothorax. No acute osseous abnormality. IMPRESSION: No active disease. Electronically Signed   By: Titus Dubin M.D.   On:  09/06/2020 13:34   DG Hand Complete Right  Result Date: 09/06/2020 CLINICAL DATA:  87 year old female status post fall with pain and tingling in the right thumb, 1st metacarpal. EXAM: RIGHT HAND - COMPLETE 3+ VIEW COMPARISON:  None. FINDINGS: Diffuse severe osteoarthritis in the right hand. Associated mild MCP and IP joint subluxations. No superimposed acute fracture or dislocation identified. The 1st metacarpal and phalanges appear intact. Mild to moderate 1st CMC joint degeneration. IMPRESSION: Diffuse severe  osteoarthritis with no acute fracture or dislocation identified. Electronically Signed   By: Genevie Ann M.D.   On: 09/06/2020 11:12   CT Maxillofacial Wo Contrast  Result Date: 09/06/2020 CLINICAL DATA:  Trauma. EXAM: CT HEAD WITHOUT CONTRAST CT MAXILLOFACIAL WITHOUT CONTRAST CT CERVICAL SPINE WITHOUT CONTRAST TECHNIQUE: Multidetector CT imaging of the head, cervical spine, and maxillofacial structures were performed using the standard protocol without intravenous contrast. Multiplanar CT image reconstructions of the cervical spine and maxillofacial structures were also generated. COMPARISON:  CTA neck March 26 21.  CT head 03/29/2020. FINDINGS: CT HEAD FINDINGS Brain: No evidence of acute infarction, hemorrhage, hydrocephalus, extra-axial collection or mass lesion/mass effect. Similar mild for age patchy white matter hypoattenuation, compatible with the sequela of chronic microvascular ischemic disease. Similar mild for age atrophy. Vascular: Calcific atherosclerosis. No hyperdense vessel identified. Skull: Frontal scalp contusion/laceration without acute fracture. Other: No mastoid effusions. CT MAXILLOFACIAL FINDINGS Osseous: Right orbital floor fracture is described below. Slightly displaced right nasal bone fracture. Bilateral TMJ joints are located. Left greater than right TMJ degenerative change. Orbits: Acute right orbital floor fracture with adjacent extraconal retrobulbar hematoma. Small amount  of fat herniates through the defect. The inferior rectus is separate from the fracture. The fracture extends across the infraorbital foramen. No proptosis. The globes are symmetric and within normal limits. Unremarkable appearance of the optic nerves and extraocular muscles. Sinuses: Right maxillary hemosinus. Scattered mucosal thickening of ethmoid air cells. Soft tissues: Right periorbital and nasal soft tissue contusions. CT CERVICAL SPINE FINDINGS Alignment: Prominent upper cervical kyphosis with anterolisthesis of C2 on C3 and C3 on C4, favored degenerative/chronic given similar alignment on prior CTA neck 2021. Skull base and vertebrae: No acute fracture. No primary bone lesion or focal pathologic process. Soft tissues and spinal canal: No prevertebral fluid or swelling. No visible canal hematoma. Disc levels: Advanced cervical degenerative change at C2-C3, C3-C4, C4-C5, and C5-C6. Degenerative disc disease is greatest on the right at C5-C6 where there is disc height loss, endplate sclerosis and spurring. Bony fusion across the right C3-C4 facet joint. Multilevel posterior disc osteophyte complexes and facet/uncovertebral hypertrophy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

## 2020-09-06 NOTE — ED Provider Notes (Signed)
[facility] EMERGENCY DEPT Provider Note   CSN: [id number] Arrival date & time: 09/06/20  1017     History Chief Complaint  Patient presents with   [name]    Kathy Velez is a 87 y.o. female.  HPI 87 year old female history of A. fib presents today after fall off the side of the bed.  States she started this morning and feels like she lost her balance.She has swelling and contusion to her forehead, right periorbital area, over nose, right lip. Patient took first dose of Neurontin last night at bedtime.    Past Medical History:  Diagnosis Date   Anemia    Anxiety    Arthritis    Back   Atrial fibrillation ([place])    Cancer ([place]) 2006   Colon.  Basal Cell Skin cancer- right arm   Colon cancer ([place])    Constipation due to pain medication therapy    after heart surgery   Dysrhythmia    PAF   GERD (gastroesophageal reflux disease)    Heart murmur    Hypertension    Hypothyroidism    RA (rheumatoid arthritis) ([place])    Restless leg    Seizures ([place])    after Heart Surgey   Stroke [facility])    TIA- found by neurologist after     Patient Active Problem List   Diagnosis Date Noted   Psychophysiological insomnia 10/11/2019   DNR (do not resuscitate) 10/11/2019   Transient ischemic attack (TIA) 03/09/2019   Pain in right knee 02/12/2018   Osteopenia of forearm 12/11/2017   Primary osteoarthritis of both knees 12/11/2017   Permanent atrial fibrillation ([place]) 11/22/2017   Hypercoagulable state due to atrial fibrillation (HCC) 10/11/2017   Chronic atrial fibrillation ([place]) 10/11/2017   Diplopia 10/11/2017   Presbycusis of both ears 10/11/2017   Acquired hammertoes of both feet 10/11/2017   Macrocytosis without anemia 10/11/2017   Genu valgum, right 10/11/2017   History of multiple strokes 10/11/2017   Mild cognitive impairment with memory loss 10/11/2017   Overactive bladder 10/11/2017   Mixed hyperlipidemia 09/03/2017   History of colon cancer, stage III  01/17/2017   Carotid stenosis 12/13/2016   Chronic congestion of paranasal sinus 11/13/2016   Chronic seasonal allergic rhinitis 06/06/2016   Interstitial lung disease ([place]) 12/28/2015   Hemispheric carotid artery syndrome 07/18/2015   Hypothyroidism 05/05/2015   Rheumatoid arthritis ([place]) 05/05/2015   Chronic constipation 05/05/2015   Vitamin B12 deficiency 05/05/2015   Iron deficiency anemia 05/02/2015   Back pain 05/02/2015   Elective surgery    Atrial flutter ([place]) 04/13/2015   Chest pain at rest 04/13/2015   Status post mitral valve repair 04/13/2015   Status post tricuspid valve repair 04/13/2015   Spondylolisthesis of lumbar region 04/12/2015   Spinal stenosis of lumbar region 02/02/2015   RLS (restless legs syndrome) 11/10/2013   HTN (hypertension), benign 07/14/2013   Depression 07/02/2011    Past Surgical History:  Procedure Laterality Date   ABDOMINAL HYSTERECTOMY  1970   Partial    COLON RESECTION  2006   cancer   COLONOSCOPY     EYE SURGERY Bilateral    Cataract   MAXIMUM ACCESS (MAS)POSTERIOR LUMBAR INTERBODY FUSION (PLIF) 2 LEVEL N/A 04/12/2015   Procedure: Lumbar Three-Five Decompression, Pedicle Screw Fixation, and Posteriolateral Arthrodesis;  Surgeon: Erline Levine, MD;  Location: [facility];  Service: Neurosurgery;  Laterality: N/A;  L3-4 L4-5 Maximum access posterior lumbar fusion, possible interbodies and resection of synovial cyst at L4-5  MITRAL VALVE REPAIR  01/20/13   Gore-tex cords to P1, P2, and P3. Magic suture to posterior medial commisure, #30 Physio 1 ring. Done in [place]   TONSILLECTOMY     about [place]  01/20/13   #28 TriAd ring done in [place]     OB History     Gravida      Para      Term      Preterm      AB      Living  1      SAB      IAB      Ectopic      Multiple      Live Births              Family History  Problem Relation Age of Onset   Lung disease Mother 42   Alcohol  abuse Son    Heart disease Father 54    Social History   Tobacco Use   Smoking status: Never   Smokeless tobacco: Never  Vaping Use   Vaping Use: Never used  Substance Use Topics   Alcohol use: Yes    Alcohol/week: 1.0 standard drink    Types: 1 Glasses of wine per week    Comment: 1-2 per week   Drug use: No    Home Medications Prior to Admission medications   Medication Sig Start Date End Date Taking? Authorizing Provider  acetaminophen (TYLENOL) 500 MG tablet Take 1,000 mg by mouth 2 (two) times daily as needed for moderate pain.     [provider]  apixaban (ELIQUIS) 2.5 MG TABS tablet Take 1 tablet (2.5 mg total) by mouth 2 (two) times daily. 04/13/20   Reed, Tiffany L, DO  atorvastatin (LIPITOR) 20 MG tablet TAKE 1 TABLET DAILY 06/16/20   Royal Hawthorn, NP  Cholecalciferol (VITAMIN D) 50 MCG (2000 UT) CAPS Take 1 capsule by mouth daily.    [provider]  Coenzyme Q10 (CO Q 10) 60 MG CAPS Take 1 capsule by mouth daily.    [provider]  digoxin (LANOXIN) 0.125 MG tablet TAKE 1 TABLET DAILY 06/16/20   Royal Hawthorn, NP  ENBREL SURECLICK 50 MG/ML injection  03/06/18   [provider]  gabapentin (NEURONTIN) 100 MG capsule Take 1 capsule (100 mg total) by mouth at bedtime. Try one capsule at bedtime, if no improvement after 1 week can try two capsules at bedtime. 09/03/20 09/03/21  Royal Hawthorn, NP  leflunomide (ARAVA) 20 MG tablet Take 20 mg by mouth daily. 10/05/19   [provider]  metoprolol succinate (TOPROL-XL) 50 MG 24 hr tablet TAKE 1 TABLET DAILY 06/16/20   Royal Hawthorn, NP  mirtazapine (REMERON) 15 MG tablet Take 1 tablet (15 mg total) by mouth at bedtime. 08/29/20   Virgie Dad, MD  Multiple Vitamins-Minerals (PRESERVISION AREDS 2) CAPS Take 1 Dose by mouth 2 (two) times daily. For eye health    [provider]  omeprazole (PRILOSEC) 40 MG capsule Take 1 capsule (40 mg total) by mouth daily. 08/28/19   Reed,  Tiffany L, DO  Psyllium (METAMUCIL MULTIHEALTH FIBER) 63 % POWD Take 1 Scoop by mouth in the morning. 06/08/20   Reed, Tiffany L, DO  SYNTHROID 75 MCG tablet TAKE 1 TABLET DAILY BEFORE BREAKFAST FOR HYPOTHYROIDISM 07/27/19   Reed, Tiffany L, DO  valsartan (DIOVAN) 80 MG tablet TAKE 1 TABLET DAILY 03/21/20   Reed, Tiffany L, DO  vitamin  B-12 (CYANOCOBALAMIN) 1000 MCG tablet Take 1,000 mcg by mouth daily.     [provider]    Allergies    Codeine, Molds & smuts, Pollen extract, and Bee venom  Review of Systems   Review of Systems  All other systems reviewed and are negative.  Physical Exam Updated Vital Signs BP (!) 124/108 (BP Location: Left Arm)   Pulse (!) 119   Temp 98 F (36.7 C)   Resp 16   SpO2 96%   Physical Exam Vitals and nursing note reviewed.  Constitutional:      Appearance: Normal appearance.  HENT:     Head: Normocephalic.     Comments: Right periorbital contusion with contusion to the nose and to right upper lip Laceration noted mid frontal scalp    Right Ear: External ear normal.     Left Ear: External ear normal.     Nose:     Comments: Nasal contusion    Mouth/Throat:     Mouth: Mucous membranes are moist.     Pharynx: Oropharynx is clear.  Eyes:     Extraocular Movements: Extraocular movements intact.     Pupils: Pupils are equal, round, and reactive to light.  Cardiovascular:     Rate and Rhythm: Tachycardia present. Rhythm irregular.     Pulses: Normal pulses.     Heart sounds: Normal heart sounds.  Pulmonary:     Effort: Pulmonary effort is normal.  Abdominal:     General: Abdomen is flat.     Palpations: Abdomen is soft.  Musculoskeletal:     Cervical back: Normal range of motion.     Comments: Tenderness over right with no obvious deformity or contusion noted  Skin:    General: Skin is warm.     Capillary Refill: Capillary refill takes less than 2 seconds.  Neurological:     General: No focal deficit present.     Mental Status:  She is alert.     Cranial Nerves: No cranial nerve deficit.     Motor: No weakness.     Gait: Gait normal.  Psychiatric:        Mood and Affect: Mood normal.        Behavior: Behavior normal.    ED Results / Procedures / Treatments   Labs (all labs ordered are listed, but only abnormal results are displayed) Labs Reviewed  CBC - Abnormal; Notable for the following components:      Result Value   RBC 2.97 (*)    Hemoglobin 10.3 (*)    HCT 30.6 (*)    MCV 103.0 (*)    MCH 34.7 (*)    All other components within normal limits  BASIC METABOLIC PANEL  URINALYSIS, ROUTINE W REFLEX MICROSCOPIC    EKG EKG Interpretation  Date/Time:  Tuesday September 06 2020 11:19:42 EDT Ventricular Rate:  113 PR Interval:    QRS Duration: 85 QT Interval:  325 QTC Calculation: 446 R Axis:   24 Text Interpretation: Atrial fibrillation Ventricular premature complex RSR' in V1 or V2, probably normal variant Nonspecific repol abnormality, diffuse leads Confirmed by Pattricia Boss [phone]) on 09/06/2020 11:46:09 AM  Radiology CT Head Wo Contrast  Result Date: 09/06/2020 CLINICAL DATA:  Trauma. EXAM: CT HEAD WITHOUT CONTRAST CT MAXILLOFACIAL WITHOUT CONTRAST CT CERVICAL SPINE WITHOUT CONTRAST TECHNIQUE: Multidetector CT imaging of the head, cervical spine, and maxillofacial structures were performed using the standard protocol without intravenous contrast. Multiplanar CT image reconstructions of the cervical spine and maxillofacial  structures were also generated. COMPARISON:  CTA neck March 26 21.  CT head 03/29/2020. FINDINGS: CT HEAD FINDINGS Brain: No evidence of acute infarction, hemorrhage, hydrocephalus, extra-axial collection or mass lesion/mass effect. Similar mild for age patchy white matter hypoattenuation, compatible with the sequela of chronic microvascular ischemic disease. Similar mild for age atrophy. Vascular: Calcific atherosclerosis. No hyperdense vessel identified. Skull: Frontal scalp  contusion/laceration without acute fracture. Other: No mastoid effusions. CT MAXILLOFACIAL FINDINGS Osseous: Right orbital floor fracture is described below. Slightly displaced right nasal bone fracture. Bilateral TMJ joints are located. Left greater than right TMJ degenerative change. Orbits: Acute right orbital floor fracture with adjacent extraconal retrobulbar hematoma. Small amount of fat herniates through the defect. The inferior rectus is separate from the fracture. The fracture extends across the infraorbital foramen. No proptosis. The globes are symmetric and within normal limits. Unremarkable appearance of the optic nerves and extraocular muscles. Sinuses: Right maxillary hemosinus. Scattered mucosal thickening of ethmoid air cells. Soft tissues: Right periorbital and nasal soft tissue contusions. CT CERVICAL SPINE FINDINGS Alignment: Prominent upper cervical kyphosis with anterolisthesis of C2 on C3 and C3 on C4, favored degenerative/chronic given similar alignment on prior CTA neck 2021. Skull base and vertebrae: No acute fracture. No primary bone lesion or focal pathologic process. Soft tissues and spinal canal: No prevertebral fluid or swelling. No visible canal hematoma. Disc levels: Advanced cervical degenerative change at C2-C3, C3-C4, C4-C5, and C5-C6. Degenerative disc disease is greatest on the right at C5-C6 where there is disc height loss, endplate sclerosis and spurring. Bony fusion across the right C3-C4 facet joint. Multilevel posterior disc osteophyte complexes and facet/uncovertebral hypertrophy with varying degrees of canal and foraminal stenosis. Upper chest: Biapical pleuroparenchymal scarring with areas of calcification. Other: Calcific atherosclerosis. IMPRESSION: CT head: 1. No evidence of acute intracranial abnormality. Similar mild for age atrophy and chronic microvascular ischemic disease. 2. Frontal scalp contusion/laceration without acute calvarial fracture. CT maxillofacial: 1.  Right periorbital soft tissue contusion with acute mildly displaced right orbital floor fracture and adjacent extraconal retrobulbar hematoma and right maxillary hemosinus. Small amount of herniated fat through the defect. The fracture extends across the infraorbital foramen. 2. Slightly displaced right nasal bone fracture with overlying nasal contusion. 3. Left greater than right TMJ degenerative change. CT cervical spine: 1. No evidence of acute fracture. 2. Prominent upper cervical kyphosis with anterolisthesis of C2 on C3 and C3 on C4, favored degenerative/chronic given similar alignment on prior CTA neck 2021. 3. Advanced upper cervical degenerative change with multilevel spinal and foraminal stenosis. An MRI could further evaluate the canal and foramina clinically indicated. Electronically Signed   By: Margaretha Sheffield MD   On: 09/06/2020 12:39   CT Cervical Spine Wo Contrast  Result Date: 09/06/2020 CLINICAL DATA:  Trauma. EXAM: CT HEAD WITHOUT CONTRAST CT MAXILLOFACIAL WITHOUT CONTRAST CT CERVICAL SPINE WITHOUT CONTRAST TECHNIQUE: Multidetector CT imaging of the head, cervical spine, and maxillofacial structures were performed using the standard protocol without intravenous contrast. Multiplanar CT image reconstructions of the cervical spine and maxillofacial structures were also generated. COMPARISON:  CTA neck March 26 21.  CT head 03/29/2020. FINDINGS: CT HEAD FINDINGS Brain: No evidence of acute infarction, hemorrhage, hydrocephalus, extra-axial collection or mass lesion/mass effect. Similar mild for age patchy white matter hypoattenuation, compatible with the sequela of chronic microvascular ischemic disease. Similar mild for age atrophy. Vascular: Calcific atherosclerosis. No hyperdense vessel identified. Skull: Frontal scalp contusion/laceration without acute fracture. Other: No mastoid effusions. CT MAXILLOFACIAL FINDINGS Osseous: Right orbital  floor fracture is described below. Slightly  displaced right nasal bone fracture. Bilateral TMJ joints are located. Left greater than right TMJ degenerative change. Orbits: Acute right orbital floor fracture with adjacent extraconal retrobulbar hematoma. Small amount of fat herniates through the defect. The inferior rectus is separate from the fracture. The fracture extends across the infraorbital foramen. No proptosis. The globes are symmetric and within normal limits. Unremarkable appearance of the optic nerves and extraocular muscles. Sinuses: Right maxillary hemosinus. Scattered mucosal thickening of ethmoid air cells. Soft tissues: Right periorbital and nasal soft tissue contusions. CT CERVICAL SPINE FINDINGS Alignment: Prominent upper cervical kyphosis with anterolisthesis of C2 on C3 and C3 on C4, favored degenerative/chronic given similar alignment on prior CTA neck 2021. Skull base and vertebrae: No acute fracture. No primary bone lesion or focal pathologic process. Soft tissues and spinal canal: No prevertebral fluid or swelling. No visible canal hematoma. Disc levels: Advanced cervical degenerative change at C2-C3, C3-C4, C4-C5, and C5-C6. Degenerative disc disease is greatest on the right at C5-C6 where there is disc height loss, endplate sclerosis and spurring. Bony fusion across the right C3-C4 facet joint. Multilevel posterior disc osteophyte complexes and facet/uncovertebral hypertrophy with varying degrees of canal and foraminal stenosis. Upper chest: Biapical pleuroparenchymal scarring with areas of calcification. Other: Calcific atherosclerosis. IMPRESSION: CT head: 1. No evidence of acute intracranial abnormality. Similar mild for age atrophy and chronic microvascular ischemic disease. 2. Frontal scalp contusion/laceration without acute calvarial fracture. CT maxillofacial: 1. Right periorbital soft tissue contusion with acute mildly displaced right orbital floor fracture and adjacent extraconal retrobulbar hematoma and right maxillary  hemosinus. Small amount of herniated fat through the defect. The fracture extends across the infraorbital foramen. 2. Slightly displaced right nasal bone fracture with overlying nasal contusion. 3. Left greater than right TMJ degenerative change. CT cervical spine: 1. No evidence of acute fracture. 2. Prominent upper cervical kyphosis with anterolisthesis of C2 on C3 and C3 on C4, favored degenerative/chronic given similar alignment on prior CTA neck 2021. 3. Advanced upper cervical degenerative change with multilevel spinal and foraminal stenosis. An MRI could further evaluate the canal and foramina clinically indicated. Electronically Signed   By: Margaretha Sheffield MD   On: 09/06/2020 12:39   DG Pelvis Portable  Result Date: 09/06/2020 CLINICAL DATA:  Fall. EXAM: PORTABLE PELVIS 1-2 VIEWS COMPARISON:  CT abdomen pelvis dated October 27, 2018. FINDINGS: There is no evidence of pelvic fracture or diastasis. No pelvic bone lesions are seen. Prior lumbar fusion. IMPRESSION: Negative. Electronically Signed   By: Titus Dubin M.D.   On: 09/06/2020 13:35   DG Chest Port 1 View  Result Date: 09/06/2020 CLINICAL DATA:  Fall.  No chest complaints. EXAM: PORTABLE CHEST 1 VIEW COMPARISON:  Chest x-Lakecia Deschamps dated March 31, 2020. FINDINGS: The heart size and mediastinal contours are within normal limits. Status post mitral and tricuspid valve replacements. Normal pulmonary vascularity. Mild biapical scarring again noted. No focal consolidation, pleural effusion, or pneumothorax. No acute osseous abnormality. IMPRESSION: No active disease. Electronically Signed   By: Titus Dubin M.D.   On: 09/06/2020 13:34   DG Hand Complete Right  Result Date: 09/06/2020 CLINICAL DATA:  87 year old female status post fall with pain and tingling in the right thumb, 1st metacarpal. EXAM: RIGHT HAND - COMPLETE 3+ VIEW COMPARISON:  None. FINDINGS: Diffuse severe osteoarthritis in the right hand. Associated mild MCP and IP joint  subluxations. No superimposed acute fracture or dislocation identified. The 1st metacarpal and phalanges appear intact. Mild  to moderate 1st CMC joint degeneration. IMPRESSION: Diffuse severe osteoarthritis with no acute fracture or dislocation identified. Electronically Signed   By: Genevie Ann M.D.   On: 09/06/2020 11:12   CT Maxillofacial Wo Contrast  Result Date: 09/06/2020 CLINICAL DATA:  Trauma. EXAM: CT HEAD WITHOUT CONTRAST CT MAXILLOFACIAL WITHOUT CONTRAST CT CERVICAL SPINE WITHOUT CONTRAST TECHNIQUE: Multidetector CT imaging of the head, cervical spine, and maxillofacial structures were performed using the standard protocol without intravenous contrast. Multiplanar CT image reconstructions of the cervical spine and maxillofacial structures were also generated. COMPARISON:  CTA neck March 26 21.  CT head 03/29/2020. FINDINGS: CT HEAD FINDINGS Brain: No evidence of acute infarction, hemorrhage, hydrocephalus, extra-axial collection or mass lesion/mass effect. Similar mild for age patchy white matter hypoattenuation, compatible with the sequela of chronic microvascular ischemic disease. Similar mild for age atrophy. Vascular: Calcific atherosclerosis. No hyperdense vessel identified. Skull: Frontal scalp contusion/laceration without acute fracture. Other: No mastoid effusions. CT MAXILLOFACIAL FINDINGS Osseous: Right orbital floor fracture is described below. Slightly displaced right nasal bone fracture. Bilateral TMJ joints are located. Left greater than right TMJ degenerative change. Orbits: Acute right orbital floor fracture with adjacent extraconal retrobulbar hematoma. Small amount of fat herniates through the defect. The inferior rectus is separate from the fracture. The fracture extends across the infraorbital foramen. No proptosis. The globes are symmetric and within normal limits. Unremarkable appearance of the optic nerves and extraocular muscles. Sinuses: Right maxillary hemosinus. Scattered  mucosal thickening of ethmoid air cells. Soft tissues: Right periorbital and nasal soft tissue contusions. CT CERVICAL SPINE FINDINGS Alignment: Prominent upper cervical kyphosis with anterolisthesis of C2 on C3 and C3 on C4, favored degenerative/chronic given similar alignment on prior CTA neck 2021. Skull base and vertebrae: No acute fracture. No primary bone lesion or focal pathologic process. Soft tissues and spinal canal: No prevertebral fluid or swelling. No visible canal hematoma. Disc levels: Advanced cervical degenerative change at C2-C3, C3-C4, C4-C5, and C5-C6. Degenerative disc disease is greatest on the right at C5-C6 where there is disc height loss, endplate sclerosis and spurring. Bony fusion across the right C3-C4 facet joint. Multilevel posterior disc osteophyte complexes and facet/uncovertebral hypertrophy with varying degrees of canal and foraminal stenosis. Upper chest: Biapical pleuroparenchymal scarring with areas of calcification. Other: Calcific atherosclerosis. IMPRESSION: CT head: 1. No evidence of acute intracranial abnormality. Similar mild for age atrophy and chronic microvascular ischemic disease. 2. Frontal scalp contusion/laceration without acute calvarial fracture. CT maxillofacial: 1. Right periorbital soft tissue contusion with acute mildly displaced right orbital floor fracture and adjacent extraconal retrobulbar hematoma and right maxillary hemosinus. Small amount of herniated fat through the defect. The fracture extends across the infraorbital foramen. 2. Slightly displaced right nasal bone fracture with overlying nasal contusion. 3. Left greater than right TMJ degenerative change. CT cervical spine: 1. No evidence of acute fracture. 2. Prominent upper cervical kyphosis with anterolisthesis of C2 on C3 and C3 on C4, favored degenerative/chronic given similar alignment on prior CTA neck 2021. 3. Advanced upper cervical degenerative change with multilevel spinal and foraminal  stenosis. An MRI could further evaluate the canal and foramina clinically indicated. Electronically Signed   By: Margaretha Sheffield MD   On: 09/06/2020 12:39    Procedures .Critical Care  Date/Time: 09/06/2020 3:08 PM Performed by: Pattricia Boss, MD Authorized by: Pattricia Boss, MD   Critical care provider statement:    Critical care time (minutes):  60   Critical care end time:  09/06/2020 3:08 PM   Critical  care was necessary to treat or prevent imminent or life-threatening deterioration of the following conditions:  Trauma   Critical care was time spent personally by me on the following activities:  Discussions with consultants, evaluation of patient's response to treatment, examination of patient, ordering and performing treatments and interventions, ordering and review of laboratory studies, ordering and review of radiographic studies, pulse oximetry, re-evaluation of patient's condition, obtaining history from patient or surrogate and review of old charts   Medications Ordered in ED Medications - No data to display  ED Course  I have reviewed the triage vital signs and the nursing notes.  Pertinent labs & imaging results that were available during my care of the patient were reviewed by me and considered in my medical decision making (see chart for details).    MDM Rules/Calculators/A&P                          87 yo female a fib, on eliquis with fall today, presents with facial trauma, CT reveals mildly displaced right orbital floor fracture with retrobulbar hematoma, nasal bone fracture, no evidence of acute intracerebral injury and DJD without any evidence of acute cervical injury. Discussed with trauma surgery.  They advised transfer to [facility] to hospitalist service They advised they will get ophthalmology involved. Patient reevaluated She remains hemodynamically stable with heart rate now 90s to 100 although continues to regular There is some oozing of the nasal septum but no  definitive laceration or active bleeding Will obtain plain chest x-Kazi Montoro and pelvis as per trauma request Discussed care with Dr. Eliberto Ivory, on-call for hospitalist, she she accepted patient.  She is aware of need for ophthalmology and ENT consult. [facility] ordered for reversal  Final Clinical Impression(s) / ED Diagnoses Final diagnoses:  Fall  Chronic anticoagulation  Closed fracture of orbit, initial encounter ([place])  Retrobulbar hematoma  Closed fracture of nasal bone, initial encounter    Rx / DC Orders ED Discharge Orders     None        Pattricia Boss, MD 09/06/20 [id number]

## 2020-09-06 NOTE — Progress Notes (Signed)
Patient's daughter, Lindajo Royal called and updated on patient's condition and room and will be in [facility] tomorrow morning.  Dr. Hal Hope called and will be seeing the patient on the floor.  Patient now resting on bed comfortably watching TV.  Will monitor.

## 2020-09-06 NOTE — H&P (Signed)
History and Physical    Kathy Velez RDE:[id number] DOB: 04-Sep-1931 DOA: 09/06/2020  PCP: Virgie Dad, MD  Patient coming from: [facility] facility.  Chief Complaint: Fall.  HPI: Kathy Velez is a 87 y.o. female with history of A. fib on Eliquis digoxin beta-blockers, hypertension, rheumatoid arthritis, history of mitral valve and tricuspid valve repair had a fall at her facility.  Patient states she was trying to get up from the bed when she fell.  Fell onto her face and hit her face.  ED Course: In the ER CT scan shows -   acute mildly displaced right orbital floor fracture and adjacent extraconal Retrobulbar hematoma and right maxillary hemosinus. S2. Slightly displaced right nasal bone fracture with overlying nasal contusion.  Trauma service was consulted and patient admitted for further observation.  Since patient was on Eliquis Kcentra was given.  On my exam patient is able to move extremities though patient is complaining of mild pain in the right wrist.  X-ray of the right wrist did not show any fracture.  EKG was showing A. fib with RVR at the time of admission but during my exam heart rate is improved.  Review of Systems: As per HPI, rest all negative.   Past Medical History:  Diagnosis Date   Anemia    Anxiety    Arthritis    Back   Atrial fibrillation ([place])    Cancer ([place]) 2006   Colon.  Basal Cell Skin cancer- right arm   Colon cancer ([facility])    Constipation due to pain medication therapy    after heart surgery   Dysrhythmia    PAF   GERD (gastroesophageal reflux disease)    Heart murmur    Hypertension    Hypothyroidism    RA (rheumatoid arthritis) ([place])    Restless leg    Seizures ([place])    after Heart Surgey   Stroke ([place])    TIA- found by neurologist after     Past Surgical History:  Procedure Laterality Date   ABDOMINAL HYSTERECTOMY  1970   Partial    COLON RESECTION  2006   cancer   COLONOSCOPY     EYE SURGERY Bilateral    Cataract    MAXIMUM ACCESS (MAS)POSTERIOR LUMBAR INTERBODY FUSION (PLIF) 2 LEVEL N/A 04/12/2015   Procedure: Lumbar Three-Five Decompression, Pedicle Screw Fixation, and Posteriolateral Arthrodesis;  Surgeon: Erline Levine, MD;  Location: [facility];  Service: Neurosurgery;  Laterality: N/A;  L3-4 L4-5 Maximum access posterior lumbar fusion, possible interbodies and resection of synovial cyst at L4-5   MITRAL VALVE REPAIR  01/20/13   Gore-tex cords to P1, P2, and P3. Magic suture to posterior medial commisure, #30 Physio 1 ring. Done in [place]   TONSILLECTOMY     about [place]  01/20/13   #28 TriAd ring done in [place]     reports that she has never smoked. She has never used smokeless tobacco. She reports current alcohol use of about 1.0 standard drink of alcohol per week. She reports that she does not use drugs.  Allergies  Allergen Reactions   Codeine Other (See Comments)    "just don't take it well"   Molds & Smuts    Pollen Extract Swelling   Bee Venom Swelling and Rash    Family History  Problem Relation Age of Onset   Lung disease Mother 63   Alcohol abuse Son    Heart disease Father 72  Prior to Admission medications   Medication Sig Start Date End Date Taking? Authorizing Provider  acetaminophen (TYLENOL) 500 MG tablet Take 1,000 mg by mouth 2 (two) times daily as needed for moderate pain.    Yes [provider]  apixaban (ELIQUIS) 2.5 MG TABS tablet Take 1 tablet (2.5 mg total) by mouth 2 (two) times daily. 04/13/20  Yes Reed, Tiffany L, DO  atorvastatin (LIPITOR) 20 MG tablet TAKE 1 TABLET DAILY 06/16/20  Yes Royal Hawthorn, NP  Cholecalciferol (VITAMIN D) 50 MCG (2000 UT) CAPS Take 1 capsule by mouth daily.   Yes [provider]  digoxin (LANOXIN) 0.125 MG tablet TAKE 1 TABLET DAILY 06/16/20  Yes Royal Hawthorn, NP  ENBREL SURECLICK 50 MG/ML injection  03/06/18  Yes [provider]  gabapentin (NEURONTIN) 100 MG capsule Take 1  capsule (100 mg total) by mouth at bedtime. Try one capsule at bedtime, if no improvement after 1 week can try two capsules at bedtime. 09/03/20 09/03/21 Yes Wert, Margreta Journey, NP  leflunomide (ARAVA) 20 MG tablet Take 20 mg by mouth daily. 10/05/19  Yes [provider]  metoprolol succinate (TOPROL-XL) 50 MG 24 hr tablet TAKE 1 TABLET DAILY 06/16/20  Yes Royal Hawthorn, NP  mirtazapine (REMERON) 15 MG tablet Take 1 tablet (15 mg total) by mouth at bedtime. 08/29/20  Yes Virgie Dad, MD  Multiple Vitamins-Minerals (PRESERVISION AREDS 2) CAPS Take 1 Dose by mouth 2 (two) times daily. For eye health   Yes [provider]  omeprazole (PRILOSEC) 40 MG capsule Take 1 capsule (40 mg total) by mouth daily. 08/28/19  Yes Reed, Tiffany L, DO  Psyllium (METAMUCIL MULTIHEALTH FIBER) 63 % POWD Take 1 Scoop by mouth in the morning. 06/08/20  Yes Reed, Tiffany L, DO  SYNTHROID 75 MCG tablet TAKE 1 TABLET DAILY BEFORE BREAKFAST FOR HYPOTHYROIDISM 07/27/19  Yes Reed, Tiffany L, DO  valsartan (DIOVAN) 80 MG tablet TAKE 1 TABLET DAILY 03/21/20  Yes Reed, Tiffany L, DO  vitamin B-12 (CYANOCOBALAMIN) 1000 MCG tablet Take 1,000 mcg by mouth daily.    Yes [provider]  Coenzyme Q10 (CO Q 10) 60 MG CAPS Take 1 capsule by mouth daily. Patient not taking: Reported on 09/06/2020    [provider]    Physical Exam: Constitutional: Moderately built and nourished. Vitals:   09/06/20 2000 09/06/20 2015 09/06/20 2045 09/06/20 2134  BP: 136/68 136/71 (!) 170/76 (!) 154/89  Pulse: 88 90 96 86  Resp: (!) 21 (!) 23 (!) 26 15  Temp:   98.1 F (36.7 C) 98.2 F (36.8 C)  TempSrc:   Oral Oral  SpO2: 94% 93% 93% 95%   Eyes: Multiple ecchymotic areas around the eyes. ENMT: Multiple ecchymotic areas on the face. Neck: No neck rigidity. Respiratory: No rhonchi or crepitations. Cardiovascular: S1-S2 heard. Abdomen: Soft nontender bowel sounds present. Musculoskeletal: No edema. Skin: Multiple  ecchymotic areas. Neurologic: Alert awake oriented to time place and person.  Moves all extremities. Psychiatric: Appears normal.  Normal affect.   Labs on Admission: I have personally reviewed following labs and imaging studies  CBC: Recent Labs  Lab 09/06/20 1127  WBC 10.1  HGB 10.3*  HCT 30.6*  MCV 103.0*  PLT 096   Basic Metabolic Panel: Recent Labs  Lab 09/06/20 1127  NA 135  K 4.0  CL 102  CO2 25  GLUCOSE 114*  BUN 19  CREATININE 0.75  CALCIUM 9.2   GFR: Estimated Creatinine Clearance: 39.9 mL/min (by C-G formula  based on SCr of 0.75 mg/dL). Liver Function Tests: No results for input(s): AST, ALT, ALKPHOS, BILITOT, PROT, ALBUMIN in the last 168 hours. No results for input(s): LIPASE, AMYLASE in the last 168 hours. No results for input(s): AMMONIA in the last 168 hours. Coagulation Profile: No results for input(s): INR, PROTIME in the last 168 hours. Cardiac Enzymes: No results for input(s): CKTOTAL, CKMB, CKMBINDEX, TROPONINI in the last 168 hours. BNP (last 3 results) No results for input(s): PROBNP in the last 8760 hours. HbA1C: No results for input(s): HGBA1C in the last 72 hours. CBG: No results for input(s): GLUCAP in the last 168 hours. Lipid Profile: No results for input(s): CHOL, HDL, LDLCALC, TRIG, CHOLHDL, LDLDIRECT in the last 72 hours. Thyroid Function Tests: No results for input(s): TSH, T4TOTAL, FREET4, T3FREE, THYROIDAB in the last 72 hours. Anemia Panel: No results for input(s): VITAMINB12, FOLATE, FERRITIN, TIBC, IRON, RETICCTPCT in the last 72 hours. Urine analysis:    Component Value Date/Time   COLORURINE COLORLESS (A) 09/06/2020 1042   APPEARANCEUR CLEAR 09/06/2020 1042   LABSPEC 1.006 09/06/2020 1042   PHURINE 6.0 09/06/2020 1042   GLUCOSEU NEGATIVE 09/06/2020 1042   HGBUR NEGATIVE 09/06/2020 1042   BILIRUBINUR NEGATIVE 09/06/2020 1042   KETONESUR NEGATIVE 09/06/2020 1042   PROTEINUR NEGATIVE 09/06/2020 1042   NITRITE  NEGATIVE 09/06/2020 1042   LEUKOCYTESUR NEGATIVE 09/06/2020 1042   Sepsis Labs: @LABRCNTIP (procalcitonin:4,lacticidven:4) ) Recent Results (from the past 240 hour(s))  Resp Panel by RT-PCR (Flu A&B, Covid) Nasopharyngeal Swab     Status: None   Collection Time: 09/06/20  1:17 PM   Specimen: Nasopharyngeal Swab; Nasopharyngeal(NP) swabs in vial transport medium  Result Value Ref Range Status   SARS Coronavirus 2 by RT PCR NEGATIVE NEGATIVE Final    Comment: (NOTE) SARS-CoV-2 target nucleic acids are NOT DETECTED.  The SARS-CoV-2 RNA is generally detectable in upper respiratory specimens during the acute phase of infection. The lowest concentration of SARS-CoV-2 viral copies this assay can detect is 138 copies/mL. A negative result does not preclude SARS-Cov-2 infection and should not be used as the sole basis for treatment or other patient management decisions. A negative result may occur with  improper specimen collection/handling, submission of specimen other than nasopharyngeal swab, presence of viral mutation(s) within the areas targeted by this assay, and inadequate number of viral copies(<138 copies/mL). A negative result must be combined with clinical observations, patient history, and epidemiological information. The expected result is Negative.  Fact Sheet for Patients:  EntrepreneurPulse.com.au  Fact Sheet for Healthcare Providers:  IncredibleEmployment.be  This test is no t yet approved or cleared by the [place] FDA and  has been authorized for detection and/or diagnosis of SARS-CoV-2 by FDA under an Emergency Use Authorization (EUA). This EUA will remain  in effect (meaning this test can be used) for the duration of the COVID-19 declaration under Section 564(b)(1) of the Act, 21 U.S.C.section 360bbb-3(b)(1), unless the authorization is terminated  or revoked sooner.       Influenza A by PCR NEGATIVE NEGATIVE Final    Influenza B by PCR NEGATIVE NEGATIVE Final    Comment: (NOTE) The Xpert Xpress SARS-CoV-2/FLU/RSV plus assay is intended as an aid in the diagnosis of influenza from Nasopharyngeal swab specimens and should not be used as a sole basis for treatment. Nasal washings and aspirates are unacceptable for Xpert Xpress SARS-CoV-2/FLU/RSV testing.  Fact Sheet for Patients: EntrepreneurPulse.com.au  Fact Sheet for Healthcare Providers: IncredibleEmployment.be  This test is not yet approved or  cleared by the [place] and has been authorized for detection and/or diagnosis of SARS-CoV-2 by FDA under an Emergency Use Authorization (EUA). This EUA will remain in effect (meaning this test can be used) for the duration of the COVID-19 declaration under Section 564(b)(1) of the Act, 21 U.S.C. section 360bbb-3(b)(1), unless the authorization is terminated or revoked.  Performed at [facility], [address], [place], [place] [address]      Radiological Exams on Admission: CT Head Wo Contrast  Result Date: 09/06/2020 CLINICAL DATA:  Trauma. EXAM: CT HEAD WITHOUT CONTRAST CT MAXILLOFACIAL WITHOUT CONTRAST CT CERVICAL SPINE WITHOUT CONTRAST TECHNIQUE: Multidetector CT imaging of the head, cervical spine, and maxillofacial structures were performed using the standard protocol without intravenous contrast. Multiplanar CT image reconstructions of the cervical spine and maxillofacial structures were also generated. COMPARISON:  CTA neck March 26 21.  CT head 03/29/2020. FINDINGS: CT HEAD FINDINGS Brain: No evidence of acute infarction, hemorrhage, hydrocephalus, extra-axial collection or mass lesion/mass effect. Similar mild for age patchy white matter hypoattenuation, compatible with the sequela of chronic microvascular ischemic disease. Similar mild for age atrophy. Vascular: Calcific atherosclerosis. No hyperdense vessel identified. Skull:  Frontal scalp contusion/laceration without acute fracture. Other: No mastoid effusions. CT MAXILLOFACIAL FINDINGS Osseous: Right orbital floor fracture is described below. Slightly displaced right nasal bone fracture. Bilateral TMJ joints are located. Left greater than right TMJ degenerative change. Orbits: Acute right orbital floor fracture with adjacent extraconal retrobulbar hematoma. Small amount of fat herniates through the defect. The inferior rectus is separate from the fracture. The fracture extends across the infraorbital foramen. No proptosis. The globes are symmetric and within normal limits. Unremarkable appearance of the optic nerves and extraocular muscles. Sinuses: Right maxillary hemosinus. Scattered mucosal thickening of ethmoid air cells. Soft tissues: Right periorbital and nasal soft tissue contusions. CT CERVICAL SPINE FINDINGS Alignment: Prominent upper cervical kyphosis with anterolisthesis of C2 on C3 and C3 on C4, favored degenerative/chronic given similar alignment on prior CTA neck 2021. Skull base and vertebrae: No acute fracture. No primary bone lesion or focal pathologic process. Soft tissues and spinal canal: No prevertebral fluid or swelling. No visible canal hematoma. Disc levels: Advanced cervical degenerative change at C2-C3, C3-C4, C4-C5, and C5-C6. Degenerative disc disease is greatest on the right at C5-C6 where there is disc height loss, endplate sclerosis and spurring. Bony fusion across the right C3-C4 facet joint. Multilevel posterior disc osteophyte complexes and facet/uncovertebral hypertrophy with varying degrees of canal and foraminal stenosis. Upper chest: Biapical pleuroparenchymal scarring with areas of calcification. Other: Calcific atherosclerosis. IMPRESSION: CT head: 1. No evidence of acute intracranial abnormality. Similar mild for age atrophy and chronic microvascular ischemic disease. 2. Frontal scalp contusion/laceration without acute calvarial fracture. CT  maxillofacial: 1. Right periorbital soft tissue contusion with acute mildly displaced right orbital floor fracture and adjacent extraconal retrobulbar hematoma and right maxillary hemosinus. Small amount of herniated fat through the defect. The fracture extends across the infraorbital foramen. 2. Slightly displaced right nasal bone fracture with overlying nasal contusion. 3. Left greater than right TMJ degenerative change. CT cervical spine: 1. No evidence of acute fracture. 2. Prominent upper cervical kyphosis with anterolisthesis of C2 on C3 and C3 on C4, favored degenerative/chronic given similar alignment on prior CTA neck 2021. 3. Advanced upper cervical degenerative change with multilevel spinal and foraminal stenosis. An MRI could further evaluate the canal and foramina clinically indicated. Electronically Signed   By: Margaretha Sheffield MD   On: 09/06/2020 12:39  CT Cervical Spine Wo Contrast  Result Date: 09/06/2020 CLINICAL DATA:  Trauma. EXAM: CT HEAD WITHOUT CONTRAST CT MAXILLOFACIAL WITHOUT CONTRAST CT CERVICAL SPINE WITHOUT CONTRAST TECHNIQUE: Multidetector CT imaging of the head, cervical spine, and maxillofacial structures were performed using the standard protocol without intravenous contrast. Multiplanar CT image reconstructions of the cervical spine and maxillofacial structures were also generated. COMPARISON:  CTA neck March 26 21.  CT head 03/29/2020. FINDINGS: CT HEAD FINDINGS Brain: No evidence of acute infarction, hemorrhage, hydrocephalus, extra-axial collection or mass lesion/mass effect. Similar mild for age patchy white matter hypoattenuation, compatible with the sequela of chronic microvascular ischemic disease. Similar mild for age atrophy. Vascular: Calcific atherosclerosis. No hyperdense vessel identified. Skull: Frontal scalp contusion/laceration without acute fracture. Other: No mastoid effusions. CT MAXILLOFACIAL FINDINGS Osseous: Right orbital floor fracture is described  below. Slightly displaced right nasal bone fracture. Bilateral TMJ joints are located. Left greater than right TMJ degenerative change. Orbits: Acute right orbital floor fracture with adjacent extraconal retrobulbar hematoma. Small amount of fat herniates through the defect. The inferior rectus is separate from the fracture. The fracture extends across the infraorbital foramen. No proptosis. The globes are symmetric and within normal limits. Unremarkable appearance of the optic nerves and extraocular muscles. Sinuses: Right maxillary hemosinus. Scattered mucosal thickening of ethmoid air cells. Soft tissues: Right periorbital and nasal soft tissue contusions. CT CERVICAL SPINE FINDINGS Alignment: Prominent upper cervical kyphosis with anterolisthesis of C2 on C3 and C3 on C4, favored degenerative/chronic given similar alignment on prior CTA neck 2021. Skull base and vertebrae: No acute fracture. No primary bone lesion or focal pathologic process. Soft tissues and spinal canal: No prevertebral fluid or swelling. No visible canal hematoma. Disc levels: Advanced cervical degenerative change at C2-C3, C3-C4, C4-C5, and C5-C6. Degenerative disc disease is greatest on the right at C5-C6 where there is disc height loss, endplate sclerosis and spurring. Bony fusion across the right C3-C4 facet joint. Multilevel posterior disc osteophyte complexes and facet/uncovertebral hypertrophy with varying degrees of canal and foraminal stenosis. Upper chest: Biapical pleuroparenchymal scarring with areas of calcification. Other: Calcific atherosclerosis. IMPRESSION: CT head: 1. No evidence of acute intracranial abnormality. Similar mild for age atrophy and chronic microvascular ischemic disease. 2. Frontal scalp contusion/laceration without acute calvarial fracture. CT maxillofacial: 1. Right periorbital soft tissue contusion with acute mildly displaced right orbital floor fracture and adjacent extraconal retrobulbar hematoma and  right maxillary hemosinus. Small amount of herniated fat through the defect. The fracture extends across the infraorbital foramen. 2. Slightly displaced right nasal bone fracture with overlying nasal contusion. 3. Left greater than right TMJ degenerative change. CT cervical spine: 1. No evidence of acute fracture. 2. Prominent upper cervical kyphosis with anterolisthesis of C2 on C3 and C3 on C4, favored degenerative/chronic given similar alignment on prior CTA neck 2021. 3. Advanced upper cervical degenerative change with multilevel spinal and foraminal stenosis. An MRI could further evaluate the canal and foramina clinically indicated. Electronically Signed   By: Margaretha Sheffield MD   On: 09/06/2020 12:39   DG Pelvis Portable  Result Date: 09/06/2020 CLINICAL DATA:  Fall. EXAM: PORTABLE PELVIS 1-2 VIEWS COMPARISON:  CT abdomen pelvis dated October 27, 2018. FINDINGS: There is no evidence of pelvic fracture or diastasis. No pelvic bone lesions are seen. Prior lumbar fusion. IMPRESSION: Negative. Electronically Signed   By: Titus Dubin M.D.   On: 09/06/2020 13:35   DG Chest Port 1 View  Result Date: 09/06/2020 CLINICAL DATA:  Fall.  No chest complaints.  EXAM: PORTABLE CHEST 1 VIEW COMPARISON:  Chest x-ray dated March 31, 2020. FINDINGS: The heart size and mediastinal contours are within normal limits. Status post mitral and tricuspid valve replacements. Normal pulmonary vascularity. Mild biapical scarring again noted. No focal consolidation, pleural effusion, or pneumothorax. No acute osseous abnormality. IMPRESSION: No active disease. Electronically Signed   By: Titus Dubin M.D.   On: 09/06/2020 13:34   DG Hand Complete Right  Result Date: 09/06/2020 CLINICAL DATA:  87 year old female status post fall with pain and tingling in the right thumb, 1st metacarpal. EXAM: RIGHT HAND - COMPLETE 3+ VIEW COMPARISON:  None. FINDINGS: Diffuse severe osteoarthritis in the right hand. Associated mild MCP and  IP joint subluxations. No superimposed acute fracture or dislocation identified. The 1st metacarpal and phalanges appear intact. Mild to moderate 1st CMC joint degeneration. IMPRESSION: Diffuse severe osteoarthritis with no acute fracture or dislocation identified. Electronically Signed   By: Genevie Ann M.D.   On: 09/06/2020 11:12   CT Maxillofacial Wo Contrast  Result Date: 09/06/2020 CLINICAL DATA:  Trauma. EXAM: CT HEAD WITHOUT CONTRAST CT MAXILLOFACIAL WITHOUT CONTRAST CT CERVICAL SPINE WITHOUT CONTRAST TECHNIQUE: Multidetector CT imaging of the head, cervical spine, and maxillofacial structures were performed using the standard protocol without intravenous contrast. Multiplanar CT image reconstructions of the cervical spine and maxillofacial structures were also generated. COMPARISON:  CTA neck March 26 21.  CT head 03/29/2020. FINDINGS: CT HEAD FINDINGS Brain: No evidence of acute infarction, hemorrhage, hydrocephalus, extra-axial collection or mass lesion/mass effect. Similar mild for age patchy white matter hypoattenuation, compatible with the sequela of chronic microvascular ischemic disease. Similar mild for age atrophy. Vascular: Calcific atherosclerosis. No hyperdense vessel identified. Skull: Frontal scalp contusion/laceration without acute fracture. Other: No mastoid effusions. CT MAXILLOFACIAL FINDINGS Osseous: Right orbital floor fracture is described below. Slightly displaced right nasal bone fracture. Bilateral TMJ joints are located. Left greater than right TMJ degenerative change. Orbits: Acute right orbital floor fracture with adjacent extraconal retrobulbar hematoma. Small amount of fat herniates through the defect. The inferior rectus is separate from the fracture. The fracture extends across the infraorbital foramen. No proptosis. The globes are symmetric and within normal limits. Unremarkable appearance of the optic nerves and extraocular muscles. Sinuses: Right maxillary hemosinus.  Scattered mucosal thickening of ethmoid air cells. Soft tissues: Right periorbital and nasal soft tissue contusions. CT CERVICAL SPINE FINDINGS Alignment: Prominent upper cervical kyphosis with anterolisthesis of C2 on C3 and C3 on C4, favored degenerative/chronic given similar alignment on prior CTA neck 2021. Skull base and vertebrae: No acute fracture. No primary bone lesion or focal pathologic process. Soft tissues and spinal canal: No prevertebral fluid or swelling. No visible canal hematoma. Disc levels: Advanced cervical degenerative change at C2-C3, C3-C4, C4-C5, and C5-C6. Degenerative disc disease is greatest on the right at C5-C6 where there is disc height loss, endplate sclerosis and spurring. Bony fusion across the right C3-C4 facet joint. Multilevel posterior disc osteophyte complexes and facet/uncovertebral hypertrophy with varying degrees of canal and foraminal stenosis. Upper chest: Biapical pleuroparenchymal scarring with areas of calcification. Other: Calcific atherosclerosis. IMPRESSION: CT head: 1. No evidence of acute intracranial abnormality. Similar mild for age atrophy and chronic microvascular ischemic disease. 2. Frontal scalp contusion/laceration without acute calvarial fracture. CT maxillofacial: 1. Right periorbital soft tissue contusion with acute mildly displaced right orbital floor fracture and adjacent extraconal retrobulbar hematoma and right maxillary hemosinus. Small amount of herniated fat through the defect. The fracture extends across the infraorbital foramen. 2. Slightly displaced  right nasal bone fracture with overlying nasal contusion. 3. Left greater than right TMJ degenerative change. CT cervical spine: 1. No evidence of acute fracture. 2. Prominent upper cervical kyphosis with anterolisthesis of C2 on C3 and C3 on C4, favored degenerative/chronic given similar alignment on prior CTA neck 2021. 3. Advanced upper cervical degenerative change with multilevel spinal and  foraminal stenosis. An MRI could further evaluate the canal and foramina clinically indicated. Electronically Signed   By: Margaretha Sheffield MD   On: 09/06/2020 12:39    EKG: Independently reviewed.  A. fib with RVR.  Assessment/Plan Principal Problem:   Periorbital hematoma of right eye Active Problems:   Atrial flutter (HCC)   Status post mitral valve repair   Status post tricuspid valve repair   DNR (do not resuscitate)   Chronic anticoagulation   Closed fracture of nasal bones   Closed fracture of orbit (HCC)   Retrobulbar hematoma   Periorbital hematoma    Fall with acute mildly displaced right orbital floor fracture and adjacent extraconal retrobulbar hematoma with a right maxillary hemosinus slightly displaced right nasal bone fracture with overlying nasal contusion -trauma service has been consulted.  Will need to consult ophthalmology and ENT. A. fib and Eliquis -patient was given Kcentra Eliquis on hold.  On beta-blockers and digoxin digoxin levels are pending. Hypertension on ARB. Anemia follow CBC. History of rheumatoid arthritis on Enbrel. History of mitral valve and tricuspid valve repair.  Since patient has fractures involving the face with hematoma on Eliquis will need close monitoring for any further worsening in inpatient status.   DVT prophylaxis: SCDs.  Avoiding anticoagulation in setting of hematoma involving the right orbital area. Code Status: DNR. Family Communication: We will need to discuss with family. Disposition Plan: To be determined. Consults called: Trauma.   Admission status: Inpatient.   Rise Patience MD [facility] Pager [phone].  If 7PM-7AM, please contact night-coverage [email or web address] Password [facility]  09/06/2020, 10:19 PM

## 2020-09-06 NOTE — Telephone Encounter (Signed)
Yes, patient is wanting it sent to [facility] but I wasn't sure if [facility] was wanting her to try first.

## 2020-09-06 NOTE — Progress Notes (Signed)
Received patient from [facility] ED via [facility], pt AOx3, VSS with slightly elevated BP at 154/89, pain at 3/10 at right hand from osteoarthritis, noted facial hematoma from fall at home, oriented to room, bed control, and call light. Floor mats placed and bed alarm on.  [facility] was made aware of patient arrival to the unit and called back informing this RN that Dr. Hal Hope will be the attending MD.  Left a voice message to pt's daughter regarding pt's arrival and admission to the unit including the visitation policy.  Will monitor.

## 2020-09-06 NOTE — ED Provider Notes (Signed)
[facility] EMERGENCY DEPT Provider Note   CSN: [id number] Arrival date & time: 09/06/20  1017     History Chief Complaint  Patient presents with   [name]    Kathy Velez is a 87 y.o. female.  HPI    87 year old female history of A. fib, on Eliquis for anticoagulation, presents today after fall.  She states she awoke this morning was sitting on the edge of the bed when she lost her balance and fell forward.  She does not think that she lost consciousness.  She has injury to the right facial area, right upper lip, and has some pain in her right hand.  She was able to get up on her own and call for help.  She denies any chest pain, chest injury, or shortness of breath. Past Medical History:  Diagnosis Date   Anemia    Anxiety    Arthritis    Back   Atrial fibrillation ([place])    Cancer ([place]) 2006   Colon.  Basal Cell Skin cancer- right arm   Colon cancer ([place])    Constipation due to pain medication therapy    after heart surgery   Dysrhythmia    PAF   GERD (gastroesophageal reflux disease)    Heart murmur    Hypertension    Hypothyroidism    RA (rheumatoid arthritis) ([place])    Restless leg    Seizures ([place])    after Heart Surgey   Stroke [facility])    TIA- found by neurologist after     Patient Active Problem List   Diagnosis Date Noted   Psychophysiological insomnia 10/11/2019   DNR (do not resuscitate) 10/11/2019   Transient ischemic attack (TIA) 03/09/2019   Pain in right knee 02/12/2018   Osteopenia of forearm 12/11/2017   Primary osteoarthritis of both knees 12/11/2017   Permanent atrial fibrillation ([place]) 11/22/2017   Hypercoagulable state due to atrial fibrillation (HCC) 10/11/2017   Chronic atrial fibrillation ([place]) 10/11/2017   Diplopia 10/11/2017   Presbycusis of both ears 10/11/2017   Acquired hammertoes of both feet 10/11/2017   Macrocytosis without anemia 10/11/2017   Genu valgum, right 10/11/2017   History of multiple strokes 10/11/2017    Mild cognitive impairment with memory loss 10/11/2017   Overactive bladder 10/11/2017   Mixed hyperlipidemia 09/03/2017   History of colon cancer, stage III 01/17/2017   Carotid stenosis 12/13/2016   Chronic congestion of paranasal sinus 11/13/2016   Chronic seasonal allergic rhinitis 06/06/2016   Interstitial lung disease ([place]) 12/28/2015   Hemispheric carotid artery syndrome 07/18/2015   Hypothyroidism 05/05/2015   Rheumatoid arthritis ([place]) 05/05/2015   Chronic constipation 05/05/2015   Vitamin B12 deficiency 05/05/2015   Iron deficiency anemia 05/02/2015   Back pain 05/02/2015   Elective surgery    Atrial flutter ([place]) 04/13/2015   Chest pain at rest 04/13/2015   Status post mitral valve repair 04/13/2015   Status post tricuspid valve repair 04/13/2015   Spondylolisthesis of lumbar region 04/12/2015   Spinal stenosis of lumbar region 02/02/2015   RLS (restless legs syndrome) 11/10/2013   HTN (hypertension), benign 07/14/2013   Depression 07/02/2011    Past Surgical History:  Procedure Laterality Date   ABDOMINAL HYSTERECTOMY  1970   Partial    COLON RESECTION  2006   cancer   COLONOSCOPY     EYE SURGERY Bilateral    Cataract   MAXIMUM ACCESS (MAS)POSTERIOR LUMBAR INTERBODY FUSION (PLIF) 2 LEVEL N/A 04/12/2015   Procedure: Lumbar Three-Five Decompression, Pedicle  Screw Fixation, and Posteriolateral Arthrodesis;  Surgeon: Erline Levine, MD;  Location: [facility];  Service: Neurosurgery;  Laterality: N/A;  L3-4 L4-5 Maximum access posterior lumbar fusion, possible interbodies and resection of synovial cyst at L4-5   MITRAL VALVE REPAIR  01/20/13   Gore-tex cords to P1, P2, and P3. Magic suture to posterior medial commisure, #30 Physio 1 ring. Done in [place]   TONSILLECTOMY     about [place]  01/20/13   #28 TriAd ring done in [place]     OB History     Gravida      Para      Term      Preterm      AB      Living  1      SAB       IAB      Ectopic      Multiple      Live Births              Family History  Problem Relation Age of Onset   Lung disease Mother 79   Alcohol abuse Son    Heart disease Father 69    Social History   Tobacco Use   Smoking status: Never   Smokeless tobacco: Never  Vaping Use   Vaping Use: Never used  Substance Use Topics   Alcohol use: Yes    Alcohol/week: 1.0 standard drink    Types: 1 Glasses of wine per week    Comment: 1-2 per week   Drug use: No    Home Medications Prior to Admission medications   Medication Sig Start Date End Date Taking? Authorizing Provider  acetaminophen (TYLENOL) 500 MG tablet Take 1,000 mg by mouth 2 (two) times daily as needed for moderate pain.     [provider]  apixaban (ELIQUIS) 2.5 MG TABS tablet Take 1 tablet (2.5 mg total) by mouth 2 (two) times daily. 04/13/20   Reed, Tiffany L, DO  atorvastatin (LIPITOR) 20 MG tablet TAKE 1 TABLET DAILY 06/16/20   Royal Hawthorn, NP  Cholecalciferol (VITAMIN D) 50 MCG (2000 UT) CAPS Take 1 capsule by mouth daily.    [provider]  Coenzyme Q10 (CO Q 10) 60 MG CAPS Take 1 capsule by mouth daily.    [provider]  digoxin (LANOXIN) 0.125 MG tablet TAKE 1 TABLET DAILY 06/16/20   Royal Hawthorn, NP  ENBREL SURECLICK 50 MG/ML injection  03/06/18   [provider]  gabapentin (NEURONTIN) 100 MG capsule Take 1 capsule (100 mg total) by mouth at bedtime. Try one capsule at bedtime, if no improvement after 1 week can try two capsules at bedtime. 09/03/20 09/03/21  Royal Hawthorn, NP  leflunomide (ARAVA) 20 MG tablet Take 20 mg by mouth daily. 10/05/19   [provider]  metoprolol succinate (TOPROL-XL) 50 MG 24 hr tablet TAKE 1 TABLET DAILY 06/16/20   Royal Hawthorn, NP  mirtazapine (REMERON) 15 MG tablet Take 1 tablet (15 mg total) by mouth at bedtime. 08/29/20   Virgie Dad, MD  Multiple Vitamins-Minerals (PRESERVISION AREDS 2) CAPS Take 1 Dose by mouth 2  (two) times daily. For eye health    [provider]  omeprazole (PRILOSEC) 40 MG capsule Take 1 capsule (40 mg total) by mouth daily. 08/28/19   Reed, Tiffany L, DO  Psyllium (METAMUCIL MULTIHEALTH FIBER) 63 % POWD Take 1 Scoop by mouth in the morning. 06/08/20   Reed, Tiffany L,  DO  SYNTHROID 75 MCG tablet TAKE 1 TABLET DAILY BEFORE BREAKFAST FOR HYPOTHYROIDISM 07/27/19   Reed, Tiffany L, DO  valsartan (DIOVAN) 80 MG tablet TAKE 1 TABLET DAILY 03/21/20   Reed, Tiffany L, DO  vitamin B-12 (CYANOCOBALAMIN) 1000 MCG tablet Take 1,000 mcg by mouth daily.     [provider]    Allergies    Codeine, Molds & smuts, Pollen extract, and Bee venom  Review of Systems   Review of Systems  All other systems reviewed and are negative.  Physical Exam Updated Vital Signs BP (!) 124/108 (BP Location: Left Arm)   Pulse (!) 119   Temp 98 F (36.7 C)   Resp 16   SpO2 96%   Physical Exam Vitals reviewed.  HENT:     Head: Normocephalic.     Nose: Nose normal.     Mouth/Throat:     Pharynx: Oropharynx is clear.  Eyes:     Extraocular Movements: Extraocular movements intact.     Pupils: Pupils are equal, round, and reactive to light.  Pulmonary:     Effort: Pulmonary effort is normal.  Abdominal:     Palpations: Abdomen is soft.  Musculoskeletal:     Cervical back: Normal range of motion.  Skin:    General: Skin is warm.  Neurological:     General: No focal deficit present.     Mental Status: She is alert.    ED Results / Procedures / Treatments   Labs (all labs ordered are listed, but only abnormal results are displayed) Labs Reviewed  BASIC METABOLIC PANEL  CBC  URINALYSIS, ROUTINE W REFLEX MICROSCOPIC    EKG EKG Interpretation  Date/Time:  Tuesday September 06 2020 11:19:42 EDT Ventricular Rate:  113 PR Interval:    QRS Duration: 85 QT Interval:  325 QTC Calculation: 446 R Axis:   24 Text Interpretation: Atrial fibrillation Ventricular premature complex RSR' in  V1 or V2, probably normal variant Nonspecific repol abnormality, diffuse leads Confirmed by Pattricia Boss [id number]) on 09/06/2020 11:46:09 AM  Radiology CT Head Wo Contrast  Result Date: 09/06/2020 CLINICAL DATA:  Trauma. EXAM: CT HEAD WITHOUT CONTRAST CT MAXILLOFACIAL WITHOUT CONTRAST CT CERVICAL SPINE WITHOUT CONTRAST TECHNIQUE: Multidetector CT imaging of the head, cervical spine, and maxillofacial structures were performed using the standard protocol without intravenous contrast. Multiplanar CT image reconstructions of the cervical spine and maxillofacial structures were also generated. COMPARISON:  CTA neck March 26 21.  CT head 03/29/2020. FINDINGS: CT HEAD FINDINGS Brain: No evidence of acute infarction, hemorrhage, hydrocephalus, extra-axial collection or mass lesion/mass effect. Similar mild for age patchy white matter hypoattenuation, compatible with the sequela of chronic microvascular ischemic disease. Similar mild for age atrophy. Vascular: Calcific atherosclerosis. No hyperdense vessel identified. Skull: Frontal scalp contusion/laceration without acute fracture. Other: No mastoid effusions. CT MAXILLOFACIAL FINDINGS Osseous: Right orbital floor fracture is described below. Slightly displaced right nasal bone fracture. Bilateral TMJ joints are located. Left greater than right TMJ degenerative change. Orbits: Acute right orbital floor fracture with adjacent extraconal retrobulbar hematoma. Small amount of fat herniates through the defect. The inferior rectus is separate from the fracture. The fracture extends across the infraorbital foramen. No proptosis. The globes are symmetric and within normal limits. Unremarkable appearance of the optic nerves and extraocular muscles. Sinuses: Right maxillary hemosinus. Scattered mucosal thickening of ethmoid air cells. Soft tissues: Right periorbital and nasal soft tissue contusions. CT CERVICAL SPINE FINDINGS Alignment: Prominent upper cervical kyphosis with  anterolisthesis of C2 on C3  and C3 on C4, favored degenerative/chronic given similar alignment on prior CTA neck 2021. Skull base and vertebrae: No acute fracture. No primary bone lesion or focal pathologic process. Soft tissues and spinal canal: No prevertebral fluid or swelling. No visible canal hematoma. Disc levels: Advanced cervical degenerative change at C2-C3, C3-C4, C4-C5, and C5-C6. Degenerative disc disease is greatest on the right at C5-C6 where there is disc height loss, endplate sclerosis and spurring. Bony fusion across the right C3-C4 facet joint. Multilevel posterior disc osteophyte complexes and facet/uncovertebral hypertrophy with varying degrees of canal and foraminal stenosis. Upper chest: Biapical pleuroparenchymal scarring with areas of calcification. Other: Calcific atherosclerosis. IMPRESSION: CT head: 1. No evidence of acute intracranial abnormality. Similar mild for age atrophy and chronic microvascular ischemic disease. 2. Frontal scalp contusion/laceration without acute calvarial fracture. CT maxillofacial: 1. Right periorbital soft tissue contusion with acute mildly displaced right orbital floor fracture and adjacent extraconal retrobulbar hematoma and right maxillary hemosinus. Small amount of herniated fat through the defect. The fracture extends across the infraorbital foramen. 2. Slightly displaced right nasal bone fracture with overlying nasal contusion. 3. Left greater than right TMJ degenerative change. CT cervical spine: 1. No evidence of acute fracture. 2. Prominent upper cervical kyphosis with anterolisthesis of C2 on C3 and C3 on C4, favored degenerative/chronic given similar alignment on prior CTA neck 2021. 3. Advanced upper cervical degenerative change with multilevel spinal and foraminal stenosis. An MRI could further evaluate the canal and foramina clinically indicated. Electronically Signed   By: Margaretha Sheffield MD   On: 09/06/2020 12:39   CT Cervical Spine Wo  Contrast  Result Date: 09/06/2020 CLINICAL DATA:  Trauma. EXAM: CT HEAD WITHOUT CONTRAST CT MAXILLOFACIAL WITHOUT CONTRAST CT CERVICAL SPINE WITHOUT CONTRAST TECHNIQUE: Multidetector CT imaging of the head, cervical spine, and maxillofacial structures were performed using the standard protocol without intravenous contrast. Multiplanar CT image reconstructions of the cervical spine and maxillofacial structures were also generated. COMPARISON:  CTA neck March 26 21.  CT head 03/29/2020. FINDINGS: CT HEAD FINDINGS Brain: No evidence of acute infarction, hemorrhage, hydrocephalus, extra-axial collection or mass lesion/mass effect. Similar mild for age patchy white matter hypoattenuation, compatible with the sequela of chronic microvascular ischemic disease. Similar mild for age atrophy. Vascular: Calcific atherosclerosis. No hyperdense vessel identified. Skull: Frontal scalp contusion/laceration without acute fracture. Other: No mastoid effusions. CT MAXILLOFACIAL FINDINGS Osseous: Right orbital floor fracture is described below. Slightly displaced right nasal bone fracture. Bilateral TMJ joints are located. Left greater than right TMJ degenerative change. Orbits: Acute right orbital floor fracture with adjacent extraconal retrobulbar hematoma. Small amount of fat herniates through the defect. The inferior rectus is separate from the fracture. The fracture extends across the infraorbital foramen. No proptosis. The globes are symmetric and within normal limits. Unremarkable appearance of the optic nerves and extraocular muscles. Sinuses: Right maxillary hemosinus. Scattered mucosal thickening of ethmoid air cells. Soft tissues: Right periorbital and nasal soft tissue contusions. CT CERVICAL SPINE FINDINGS Alignment: Prominent upper cervical kyphosis with anterolisthesis of C2 on C3 and C3 on C4, favored degenerative/chronic given similar alignment on prior CTA neck 2021. Skull base and vertebrae: No acute fracture. No  primary bone lesion or focal pathologic process. Soft tissues and spinal canal: No prevertebral fluid or swelling. No visible canal hematoma. Disc levels: Advanced cervical degenerative change at C2-C3, C3-C4, C4-C5, and C5-C6. Degenerative disc disease is greatest on the right at C5-C6 where there is disc height loss, endplate sclerosis and spurring. Bony fusion across the  right C3-C4 facet joint. Multilevel posterior disc osteophyte complexes and facet/uncovertebral hypertrophy with varying degrees of canal and foraminal stenosis. Upper chest: Biapical pleuroparenchymal scarring with areas of calcification. Other: Calcific atherosclerosis. IMPRESSION: CT head: 1. No evidence of acute intracranial abnormality. Similar mild for age atrophy and chronic microvascular ischemic disease. 2. Frontal scalp contusion/laceration without acute calvarial fracture. CT maxillofacial: 1. Right periorbital soft tissue contusion with acute mildly displaced right orbital floor fracture and adjacent extraconal retrobulbar hematoma and right maxillary hemosinus. Small amount of herniated fat through the defect. The fracture extends across the infraorbital foramen. 2. Slightly displaced right nasal bone fracture with overlying nasal contusion. 3. Left greater than right TMJ degenerative change. CT cervical spine: 1. No evidence of acute fracture. 2. Prominent upper cervical kyphosis with anterolisthesis of C2 on C3 and C3 on C4, favored degenerative/chronic given similar alignment on prior CTA neck 2021. 3. Advanced upper cervical degenerative change with multilevel spinal and foraminal stenosis. An MRI could further evaluate the canal and foramina clinically indicated. Electronically Signed   By: Margaretha Sheffield MD   On: 09/06/2020 12:39   DG Pelvis Portable  Result Date: 09/06/2020 CLINICAL DATA:  Fall. EXAM: PORTABLE PELVIS 1-2 VIEWS COMPARISON:  CT abdomen pelvis dated October 27, 2018. FINDINGS: There is no evidence of pelvic  fracture or diastasis. No pelvic bone lesions are seen. Prior lumbar fusion. IMPRESSION: Negative. Electronically Signed   By: Titus Dubin M.D.   On: 09/06/2020 13:35   DG Chest Port 1 View  Result Date: 09/06/2020 CLINICAL DATA:  Fall.  No chest complaints. EXAM: PORTABLE CHEST 1 VIEW COMPARISON:  Chest x-Elexius Minar dated March 31, 2020. FINDINGS: The heart size and mediastinal contours are within normal limits. Status post mitral and tricuspid valve replacements. Normal pulmonary vascularity. Mild biapical scarring again noted. No focal consolidation, pleural effusion, or pneumothorax. No acute osseous abnormality. IMPRESSION: No active disease. Electronically Signed   By: Titus Dubin M.D.   On: 09/06/2020 13:34   DG Hand Complete Right  Result Date: 09/06/2020 CLINICAL DATA:  87 year old female status post fall with pain and tingling in the right thumb, 1st metacarpal. EXAM: RIGHT HAND - COMPLETE 3+ VIEW COMPARISON:  None. FINDINGS: Diffuse severe osteoarthritis in the right hand. Associated mild MCP and IP joint subluxations. No superimposed acute fracture or dislocation identified. The 1st metacarpal and phalanges appear intact. Mild to moderate 1st CMC joint degeneration. IMPRESSION: Diffuse severe osteoarthritis with no acute fracture or dislocation identified. Electronically Signed   By: Genevie Ann M.D.   On: 09/06/2020 11:12   CT Maxillofacial Wo Contrast  Result Date: 09/06/2020 CLINICAL DATA:  Trauma. EXAM: CT HEAD WITHOUT CONTRAST CT MAXILLOFACIAL WITHOUT CONTRAST CT CERVICAL SPINE WITHOUT CONTRAST TECHNIQUE: Multidetector CT imaging of the head, cervical spine, and maxillofacial structures were performed using the standard protocol without intravenous contrast. Multiplanar CT image reconstructions of the cervical spine and maxillofacial structures were also generated. COMPARISON:  CTA neck March 26 21.  CT head 03/29/2020. FINDINGS: CT HEAD FINDINGS Brain: No evidence of acute infarction,  hemorrhage, hydrocephalus, extra-axial collection or mass lesion/mass effect. Similar mild for age patchy white matter hypoattenuation, compatible with the sequela of chronic microvascular ischemic disease. Similar mild for age atrophy. Vascular: Calcific atherosclerosis. No hyperdense vessel identified. Skull: Frontal scalp contusion/laceration without acute fracture. Other: No mastoid effusions. CT MAXILLOFACIAL FINDINGS Osseous: Right orbital floor fracture is described below. Slightly displaced right nasal bone fracture. Bilateral TMJ joints are located. Left greater than right TMJ degenerative  change. Orbits: Acute right orbital floor fracture with adjacent extraconal retrobulbar hematoma. Small amount of fat herniates through the defect. The inferior rectus is separate from the fracture. The fracture extends across the infraorbital foramen. No proptosis. The globes are symmetric and within normal limits. Unremarkable appearance of the optic nerves and extraocular muscles. Sinuses: Right maxillary hemosinus. Scattered mucosal thickening of ethmoid air cells. Soft tissues: Right periorbital and nasal soft tissue contusions. CT CERVICAL SPINE FINDINGS Alignment: Prominent upper cervical kyphosis with anterolisthesis of C2 on C3 and C3 on C4, favored degenerative/chronic given similar alignment on prior CTA neck 2021. Skull base and vertebrae: No acute fracture. No primary bone lesion or focal pathologic process. Soft tissues and spinal canal: No prevertebral fluid or swelling. No visible canal hematoma. Disc levels: Advanced cervical degenerative change at C2-C3, C3-C4, C4-C5, and C5-C6. Degenerative disc disease is greatest on the right at C5-C6 where there is disc height loss, endplate sclerosis and spurring. Bony fusion across the right C3-C4 facet joint. Multilevel posterior disc osteophyte complexes and facet/uncovertebral hypertrophy with varying degrees of canal and foraminal stenosis. Upper chest:  Biapical pleuroparenchymal scarring with areas of calcification. Other: Calcific atherosclerosis. IMPRESSION: CT head: 1. No evidence of acute intracranial abnormality. Similar mild for age atrophy and chronic microvascular ischemic disease. 2. Frontal scalp contusion/laceration without acute calvarial fracture. CT maxillofacial: 1. Right periorbital soft tissue contusion with acute mildly displaced right orbital floor fracture and adjacent extraconal retrobulbar hematoma and right maxillary hemosinus. Small amount of herniated fat through the defect. The fracture extends across the infraorbital foramen. 2. Slightly displaced right nasal bone fracture with overlying nasal contusion. 3. Left greater than right TMJ degenerative change. CT cervical spine: 1. No evidence of acute fracture. 2. Prominent upper cervical kyphosis with anterolisthesis of C2 on C3 and C3 on C4, favored degenerative/chronic given similar alignment on prior CTA neck 2021. 3. Advanced upper cervical degenerative change with multilevel spinal and foraminal stenosis. An MRI could further evaluate the canal and foramina clinically indicated. Electronically Signed   By: Margaretha Sheffield MD   On: 09/06/2020 12:39    Procedures .Critical Care  Date/Time: 09/06/2020 3:19 PM Performed by: Pattricia Boss, MD Authorized by: Pattricia Boss, MD   Critical care provider statement:    Critical care time (minutes):  60   Critical care end time:  09/06/2020 3:19 PM   Critical care was necessary to treat or prevent imminent or life-threatening deterioration of the following conditions:  Trauma   Critical care was time spent personally by me on the following activities:  Discussions with consultants, evaluation of patient's response to treatment, examination of patient, ordering and performing treatments and interventions, ordering and review of laboratory studies, ordering and review of radiographic studies, pulse oximetry, re-evaluation of patient's  condition, obtaining history from patient or surrogate and review of old charts   Medications Ordered in ED Medications - No data to display  ED Course  I have reviewed the triage vital signs and the nursing notes.  Pertinent labs & imaging results that were available during my care of the patient were reviewed by me and considered in my medical decision making (see chart for details).    MDM Rules/Calculators/A&P                         87 year old female on chronic anticoagulation, who had first dose of Neurontin last night.  She fell today.  This appears to been a mechanical fall and  she denies loss of consciousness.  Head, neck, facial bone CT significant for orbital fracture with retrobulbar hematoma.  Reversal of anticoagulation initiated Discussed with trauma surgery Discussed with hospitalist, Dr. Eliberto Ivory. Both trauma and hospitalist aware of patient's need for ophthalmology and ENT.  They state they will get ophthalmology involved when patient gets to [facility]. Here her extraocular movements and vision are intact. She has been rechecked 3 times and has remained hemodynamically stable.  She received a small dose of fentanyl for pain.  Her head of bed is elevated. Dr. Will Bonnet oncoming EDP is aware of patient's evaluation and plan for transfer.  Final Clinical Impression(s) / ED Diagnoses Final diagnoses:  Fall  Chronic anticoagulation  Closed fracture of orbit, initial encounter ([place])  Retrobulbar hematoma  Closed fracture of nasal bone, initial encounter    Rx / DC Orders ED Discharge Orders     None        Pattricia Boss, MD 09/06/20 1520
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
## 2020-09-06 NOTE — ED Notes (Signed)
  Went in to round on patient and update on pending admission.  Patient was A&O x4 and had minimal pain but was comfortable.  Daughter was upset about care she has received up to this point.  Daughter stated that no one had come to check on her in several hours and she had not received her daily medications.  I explained to the daughter that daily medications are usually ordered by admitting MD and that her bed was assigned at [facility].  Daughter requested MD look over her home medications and state which ones were needed at this time.

## 2020-09-07 DIAGNOSIS — H05231 Hemorrhage of right orbit: Secondary | ICD-10-CM | POA: Diagnosis not present

## 2020-09-07 LAB — BASIC METABOLIC PANEL
Anion gap: 10 (ref 5–15)
BUN: 15 mg/dL (ref 8–23)
CO2: 26 mmol/L (ref 22–32)
Calcium: 9.1 mg/dL (ref 8.9–10.3)
Chloride: 102 mmol/L (ref 98–111)
Creatinine, Ser: 0.85 mg/dL (ref 0.44–1.00)
GFR, Estimated: >60 mL/min (ref >60)
Glucose, Bld: 96 mg/dL (ref 70–99)
Potassium: 4.3 mmol/L (ref 3.5–5.1)
Sodium: 138 mmol/L (ref 135–145)

## 2020-09-07 LAB — CBC
HCT: 30.4 % — ABNORMAL LOW (ref 36.0–46.0)
Hemoglobin: 10.1 g/dL — ABNORMAL LOW (ref 12.0–15.0)
MCH: 34.7 pg — ABNORMAL HIGH (ref 26.0–34.0)
MCHC: 33.2 g/dL (ref 30.0–36.0)
MCV: 104.5 fL — ABNORMAL HIGH (ref 80.0–100.0)
Platelets: 188 10*3/uL (ref 150–400)
RBC: 2.91 MIL/uL — ABNORMAL LOW (ref 3.87–5.11)
RDW: 14.6 % (ref 11.5–15.5)
WBC: 8.5 10*3/uL (ref 4.0–10.5)
nRBC: 0 % (ref 0.0–0.2)

## 2020-09-07 LAB — DIGOXIN LEVEL: Digoxin Level: 0.8 ng/mL (ref 0.8–2.0)

## 2020-09-07 MED ORDER — OXYMETAZOLINE HCL 0.05 % NA SOLN
2.0000 | Freq: Two times a day (BID) | NASAL | Status: DC | PRN
  Filled 2020-09-07: qty 30

## 2020-09-07 MED ORDER — ACETAMINOPHEN 325 MG PO TABS: 650.0000 mg | ORAL_TABLET | Freq: Four times a day (QID) | ORAL | Status: DC | PRN

## 2020-09-07 MED ORDER — SALINE SPRAY 0.65 % NA SOLN
2.0000 | NASAL | Status: DC | PRN
  Filled 2020-09-07: qty 44

## 2020-09-07 MED ORDER — OXYCODONE HCL 5 MG PO TABS: 2.5000 mg | ORAL_TABLET | ORAL | Status: DC | PRN

## 2020-09-07 NOTE — Consult Note (Signed)
Chief Complaint  Patient presents with   Fall  :       Ophthalmology HPI: This is a 87 y.o.  female with a past ocular history listed below that presents with periorbital ecchymosis after furring a fall. Patient states she has had some double vision even prior to the fall when looking up and to the right.  "It hasn't changed really since my fall. I still have doubling when looking up and to the right."  Denies flashes of light, floaters, decrease in vision, curtains coming over the vision, brow aches, or numbness on the face.      Past Ocular History: pseudophakia, macular degeneration, diplopia     Last Eye Exam: >5 years ago.      Past Medical History:  Diagnosis Date   Anemia    Anxiety    Arthritis    Back   Atrial fibrillation ([place])    Cancer ([place]) 2006   Colon.  Basal Cell Skin cancer- right arm   Colon cancer ([facility])    Constipation due to pain medication therapy    after heart surgery   Dysrhythmia    PAF   GERD (gastroesophageal reflux disease)    Heart murmur    Hypertension    Hypothyroidism    RA (rheumatoid arthritis) ([place])    Restless leg    Seizures ([place])    after Heart Surgey   Stroke ([place])    TIA- found by neurologist after      Past Surgical History:  Procedure Laterality Date   ABDOMINAL HYSTERECTOMY  1970   Partial    COLON RESECTION  2006   cancer   COLONOSCOPY     EYE SURGERY Bilateral    Cataract   MAXIMUM ACCESS (MAS)POSTERIOR LUMBAR INTERBODY FUSION (PLIF) 2 LEVEL N/A 04/12/2015   Procedure: Lumbar Three-Five Decompression, Pedicle Screw Fixation, and Posteriolateral Arthrodesis;  Surgeon: Erline Levine, MD;  Location: [facility];  Service: Neurosurgery;  Laterality: N/A;  L3-4 L4-5 Maximum access posterior lumbar fusion, possible interbodies and resection of synovial cyst at L4-5   MITRAL VALVE REPAIR  01/20/13   Gore-tex cords to P1, P2, and P3. Magic suture to posterior medial commisure, #30 Physio 1 ring. Done in [place]    TONSILLECTOMY     about [place]  01/20/13   #28 TriAd ring done in [place]     Social History   Socioeconomic History   Marital status: Widowed    Spouse name: Not on file   Number of children: Not on file   Years of education: Not on file   Highest education level: Not on file  Occupational History   Occupation: elementary school teacher    Comment: retired   Tobacco Use   Smoking status: Never   Smokeless tobacco: Never  Vaping Use   Vaping Use: Never used  Substance and Sexual Activity   Alcohol use: Yes    Alcohol/week: 1.0 standard drink    Types: 1 Glasses of wine per week    Comment: 1-2 per week   Drug use: No   Sexual activity: Not Currently  Other Topics Concern   Not on file  Social History Narrative   Social History      Diet? Healthy- low salt, sugar, fat      Do you drink/eat things with caffeine? On occasion      Marital status?        widow  What year were you married? 1958      Do you live in a house, apartment, assisted living, condo, trailer, etc.? apartment      Is it one or more stories? one      How many persons live in your home? one      Do you have any pets in your home? (please list) no      Highest level of education completed? 4 year college      Current or past profession: Statistician      Do you exercise?             yes                         Type & how often? Classes- senior retirement community/ some walking      Advanced Directives      Do you have a living will?      Do you have a DNR form?                                  If not, do you want to discuss one?      Do you have signed POA/HPOA for forms?       Functional Status Completed by: Daughter, Di Kindle      Do you have difficulty bathing or dressing yourself? no      Do you have difficulty preparing food or eating? no      Do you have difficulty managing your medications? no      Do you have  difficulty managing your finances? no      Do you have difficulty affording your medications? no   Right handed   Social Determinants of Health   Financial Resource Strain: Not on file  Food Insecurity: Not on file  Transportation Needs: Not on file  Physical Activity: Not on file  Stress: Not on file  Social Connections: Not on file  Intimate Partner Violence: Not on file     Allergies  Allergen Reactions   Codeine Other (See Comments)    "just don't take it well"   Molds & Smuts    Pollen Extract Swelling   Bee Venom Swelling and Rash     No current facility-administered medications on file prior to encounter.   Current Outpatient Medications on File Prior to Encounter  Medication Sig Dispense Refill   acetaminophen (TYLENOL) 500 MG tablet Take 1,000 mg by mouth 2 (two) times daily as needed for moderate pain.      apixaban (ELIQUIS) 2.5 MG TABS tablet Take 1 tablet (2.5 mg total) by mouth 2 (two) times daily. 180 tablet 3   atorvastatin (LIPITOR) 20 MG tablet TAKE 1 TABLET DAILY 90 tablet 3   Cholecalciferol (VITAMIN D) 50 MCG (2000 UT) CAPS Take 1 capsule by mouth daily.     digoxin (LANOXIN) 0.125 MG tablet TAKE 1 TABLET DAILY 90 tablet 3   ENBREL SURECLICK 50 MG/ML injection      gabapentin (NEURONTIN) 100 MG capsule Take 1 capsule (100 mg total) by mouth at bedtime. Try one capsule at bedtime, if no improvement after 1 week can try two capsules at bedtime. 30 capsule 1   leflunomide (ARAVA) 20 MG tablet Take 20 mg by mouth daily.     metoprolol succinate (TOPROL-XL) 50 MG 24 hr tablet TAKE 1 TABLET DAILY 90 tablet  3   mirtazapine (REMERON) 15 MG tablet Take 1 tablet (15 mg total) by mouth at bedtime. 90 tablet 1   Multiple Vitamins-Minerals (PRESERVISION AREDS 2) CAPS Take 1 Dose by mouth 2 (two) times daily. For eye health     omeprazole (PRILOSEC) 40 MG capsule Take 1 capsule (40 mg total) by mouth daily. 90 capsule 3   Psyllium (METAMUCIL MULTIHEALTH FIBER) 63 %  POWD Take 1 Scoop by mouth in the morning. 660 g 0   SYNTHROID 75 MCG tablet TAKE 1 TABLET DAILY BEFORE BREAKFAST FOR HYPOTHYROIDISM 90 tablet 3   valsartan (DIOVAN) 80 MG tablet TAKE 1 TABLET DAILY 90 tablet 3   vitamin B-12 (CYANOCOBALAMIN) 1000 MCG tablet Take 1,000 mcg by mouth daily.      Coenzyme Q10 (CO Q 10) 60 MG CAPS Take 1 capsule by mouth daily. (Patient not taking: Reported on 09/06/2020)       ROS    Exam:  General: Awake, Alert, Oriented *3  Vision (near): with correction   OD: J1  OS: J1  Confrontational Field:   Full to count fingers, both eyes  Extraocular Motility:  Full ductions and versions, both eyes  [place]:   Exodevation worse on right gaze.  Right hypo worse on up right gaze.  V-pattern XT.   External:   Bilateral ecchymosis, Brusing forehead, bilateral lid ecchymosis,  V1-3 intact, infraorbital nerve appears intact. No clear numbness right lower lid/cheek.   Hertel:   20.5/20 (98)  Pupils  OD: 78mm to 15mm reactive without afferent pupillary defect (APD)  OS: 77mm to 41mm reactive without afferent pupillary defect (APD)   IOP(tonopen)  OD: 15  OS: 15  Slit Lamp Exam:  Lids/Lashes  OD: Normal Lids and lashes, No lesion or injury  OS: Normal lids and lashes, nor lesion or injury  Conjucntiva/Sclera  OD: White and quiet  OS: White and quiet  Cornea  OD: Clear without abrasion or defect  OS: Clear without abrasion or defect  Anterior Chamber  OD: Deep and quiet  OS: Deep and quiet  Iris  OD: Normal iris architecture  OS: Normal Iris Architecture   Lens  OD: PCIOL   OS: PCIOL   Anterior Vitreous  OD: Clear, without cell  OS: Clear without cell   POSTERIOR POLE EXAM (Dialated with phenylephrine and tropicamide.Dilation may last up to 24 hours)  View:   OD: 20/20 view without opacities  OS: 20/20 view without opacities  Vitreous:   OD: Clear, no cell  OS: Clear, no cell  Disc:   OD: flat, sharp margin, with appropriate  color  OS: flat, sharp margin, with appropriate color  C:D Ratio:   OD: 0.4  OS: 0.4  Macula  OD: drusen  OS: drusen  Vessels  OD: Normal vasculature  OS: Normal vasculature  Periphery  OD: Flat 360 degrees without tear, hole or detachment  OS: Flat 360 degrees without tear, hole or detachment     Assessment and Plan:   This is 87 y.o.  female with right orbital floor fracture and right retrobulbar hematoma.  No acute intervention recommended.   Diplopia minimal. Discussed patching one eye to eliminate diplopia.  Anticipate spontaneous resolution as swelling reduces.  Follow up as outpatient in 3 weeks from discharge.   Orbital floor fracture:  - No enophthalmos, minimal motility disturbance - No nose blowing. Ice packs as needed q15 per hour for first 24 hours for swelling, Follow up with ENT as scheduled.   Retrobulbar  hemorrhage - Minimal motility disturbance. no compressive optic neuropathy - OK to restart anticoagulation per primary team.   Macular degeneration - AREDS vitamins  Pseudophakia - Stable   Follow up as outpatient 3-4 weeks from discharge.    Julian Reil, M.D.  [facility] [address] [place], [place] [address] [phone]

## 2020-09-07 NOTE — Consult Note (Signed)
ENT/FACIAL TRAUMA CONSULT:  Reason for Consult: Right orbital floor fracture, right nasal bone fracture  Referring Physician:  Trauma Service  HPI: Kathy Velez is an 87 y.o. female with extensive past medical history, anticoagulated on Eliquis for atrial fibrillation, who presents as transfer from [place] after falling. Patient reports she took Neurontin for the first time on evening of 06/12, and then when she awoke yesterday she lost her balance while sitting on edge of bed and fell forward, striking her face on the floor. She denies known LOC.  She currently endorses right-sided facial pain as well as right-sided nasal congestion. Patient had extensive imaging workup performed, which demonstrated a right orbital floor fracture with fat herniation and retrobulbar hematoma, as well as a right nasal bone fracture. Eliquis was reversed with KCentra.  On-call physician for max-face trauma was consulted. However, patient requested evaluation by ENT.  She denies previous history of facial trauma or injury.  She denies issues with significant nasal congestion prior to fall.  She states she had immediate onset of nasal bleeding at time of injury, but has not had any further nasal bleeding since that time.  Past Medical History:  Diagnosis Date   Anemia    Anxiety    Arthritis    Back   Atrial fibrillation ([facility])    Cancer ([facility]) 2006   Colon.  Basal Cell Skin cancer- right arm   Colon cancer ([facility])    Constipation due to pain medication therapy    after heart surgery   Dysrhythmia    PAF   GERD (gastroesophageal reflux disease)    Heart murmur    Hypertension    Hypothyroidism    RA (rheumatoid arthritis) ([facility])    Restless leg    Seizures ([facility])    after Heart Surgey   Stroke ([facility])    TIA- found by neurologist after     Past Surgical History:  Procedure Laterality Date   ABDOMINAL HYSTERECTOMY  1970   Partial    COLON RESECTION  2006   cancer   COLONOSCOPY     EYE  SURGERY Bilateral    Cataract   MAXIMUM ACCESS (MAS)POSTERIOR LUMBAR INTERBODY FUSION (PLIF) 2 LEVEL N/A 04/12/2015   Procedure: Lumbar Three-Five Decompression, Pedicle Screw Fixation, and Posteriolateral Arthrodesis;  Surgeon: Erline Levine, MD;  Location: [facility];  Service: Neurosurgery;  Laterality: N/A;  L3-4 L4-5 Maximum access posterior lumbar fusion, possible interbodies and resection of synovial cyst at L4-5   MITRAL VALVE REPAIR  01/20/13   Gore-tex cords to P1, P2, and P3. Magic suture to posterior medial commisure, #30 Physio 1 ring. Done in [place]   TONSILLECTOMY     about [place]  01/20/13   #28 TriAd ring done in [place]    Family History  Problem Relation Age of Onset   Lung disease Mother 43   Alcohol abuse Son    Heart disease Father 102    Social History:  reports that she has never smoked. She has never used smokeless tobacco. She reports current alcohol use of about 1.0 standard drink of alcohol per week. She reports that she does not use drugs.  Allergies:  Allergies  Allergen Reactions   Codeine Other (See Comments)    "just don't take it well"   Molds & Smuts    Pollen Extract Swelling   Bee Venom Swelling and Rash    Medications: I have reviewed the patient's current medications.  Results for orders  placed or performed during the hospital encounter of 09/06/20 (from the past 48 hour(s))  Urinalysis, Routine w reflex microscopic Urine, Clean Catch     Status: Abnormal   Collection Time: 09/06/20 10:42 AM  Result Value Ref Range   Color, Urine COLORLESS (A) YELLOW   APPearance CLEAR CLEAR   Specific Gravity, Urine 1.006 1.005 - 1.030   pH 6.0 5.0 - 8.0   Glucose, UA NEGATIVE NEGATIVE mg/dL   Hgb urine dipstick NEGATIVE NEGATIVE   Bilirubin Urine NEGATIVE NEGATIVE   Ketones, ur NEGATIVE NEGATIVE mg/dL   Protein, ur NEGATIVE NEGATIVE mg/dL   Nitrite NEGATIVE NEGATIVE   Leukocytes,Ua NEGATIVE NEGATIVE    Comment: Performed  at [facility], [place], [place] [address]  Basic metabolic panel     Status: Abnormal   Collection Time: 09/06/20 11:27 AM  Result Value Ref Range   Sodium 135 135 - 145 mmol/L   Potassium 4.0 3.5 - 5.1 mmol/L   Chloride 102 98 - 111 mmol/L   CO2 25 22 - 32 mmol/L   Glucose, Bld 114 (H) 70 - 99 mg/dL    Comment: Glucose reference range applies only to samples taken after fasting for at least 8 hours.   BUN 19 8 - 23 mg/dL   Creatinine, Ser 0.75 0.44 - 1.00 mg/dL   Calcium 9.2 8.9 - 10.3 mg/dL   GFR, Estimated >60 >60 mL/min    Comment: (NOTE) Calculated using the CKD-EPI Creatinine Equation (2021)    Anion gap 8 5 - 15    Comment: Performed at [facility], [place], [place] [address]  CBC     Status: Abnormal   Collection Time: 09/06/20 11:27 AM  Result Value Ref Range   WBC 10.1 4.0 - 10.5 K/uL   RBC 2.97 (L) 3.87 - 5.11 MIL/uL   Hemoglobin 10.3 (L) 12.0 - 15.0 g/dL   HCT 30.6 (L) 36.0 - 46.0 %   MCV 103.0 (H) 80.0 - 100.0 fL   MCH 34.7 (H) 26.0 - 34.0 pg   MCHC 33.7 30.0 - 36.0 g/dL   RDW 14.7 11.5 - 15.5 %   Platelets 172 150 - 400 K/uL   nRBC 0.0 0.0 - 0.2 %    Comment: Performed at [facility], [address], [place], [place] [address]  Resp Panel by RT-PCR (Flu A&B, Covid) Nasopharyngeal Swab     Status: None   Collection Time: 09/06/20  1:17 PM   Specimen: Nasopharyngeal Swab; Nasopharyngeal(NP) swabs in vial transport medium  Result Value Ref Range   SARS Coronavirus 2 by RT PCR NEGATIVE NEGATIVE    Comment: (NOTE) SARS-CoV-2 target nucleic acids are NOT DETECTED.  The SARS-CoV-2 RNA is generally detectable in upper respiratory specimens during the acute phase of infection. The lowest concentration of SARS-CoV-2 viral copies this assay can detect is 138 copies/mL. A negative result does not preclude SARS-Cov-2 infection and should not be used as the sole basis for  treatment or other patient management decisions. A negative result may occur with  improper specimen collection/handling, submission of specimen other than nasopharyngeal swab, presence of viral mutation(s) within the areas targeted by this assay, and inadequate number of viral copies(<138 copies/mL). A negative result must be combined with clinical observations, patient history, and epidemiological information. The expected result is Negative.  Fact Sheet for Patients:  EntrepreneurPulse.com.au  Fact Sheet for Healthcare Providers:  IncredibleEmployment.be  This test is no t yet approved or cleared  by the [place] and  has been authorized for detection and/or diagnosis of SARS-CoV-2 by FDA under an Emergency Use Authorization (EUA). This EUA will remain  in effect (meaning this test can be used) for the duration of the COVID-19 declaration under Section 564(b)(1) of the Act, 21 U.S.C.section 360bbb-3(b)(1), unless the authorization is terminated  or revoked sooner.       Influenza A by PCR NEGATIVE NEGATIVE   Influenza B by PCR NEGATIVE NEGATIVE    Comment: (NOTE) The Xpert Xpress SARS-CoV-2/FLU/RSV plus assay is intended as an aid in the diagnosis of influenza from Nasopharyngeal swab specimens and should not be used as a sole basis for treatment. Nasal washings and aspirates are unacceptable for Xpert Xpress SARS-CoV-2/FLU/RSV testing.  Fact Sheet for Patients: EntrepreneurPulse.com.au  Fact Sheet for Healthcare Providers: IncredibleEmployment.be  This test is not yet approved or cleared by the [place] FDA and has been authorized for detection and/or diagnosis of SARS-CoV-2 by FDA under an Emergency Use Authorization (EUA). This EUA will remain in effect (meaning this test can be used) for the duration of the COVID-19 declaration under Section 564(b)(1) of the Act, 21 U.S.C. section  360bbb-3(b)(1), unless the authorization is terminated or revoked.  Performed at [facility], [address], [place], [place] [address]   Basic metabolic panel     Status: None   Collection Time: 09/07/20 12:37 AM  Result Value Ref Range   Sodium 138 135 - 145 mmol/L   Potassium 4.3 3.5 - 5.1 mmol/L   Chloride 102 98 - 111 mmol/L   CO2 26 22 - 32 mmol/L   Glucose, Bld 96 70 - 99 mg/dL    Comment: Glucose reference range applies only to samples taken after fasting for at least 8 hours.   BUN 15 8 - 23 mg/dL   Creatinine, Ser 0.85 0.44 - 1.00 mg/dL   Calcium 9.1 8.9 - 10.3 mg/dL   GFR, Estimated >60 >60 mL/min    Comment: (NOTE) Calculated using the CKD-EPI Creatinine Equation (2021)    Anion gap 10 5 - 15    Comment: Performed at [facility] [address], [place], [place] [address]  CBC     Status: Abnormal   Collection Time: 09/07/20 12:37 AM  Result Value Ref Range   WBC 8.5 4.0 - 10.5 K/uL   RBC 2.91 (L) 3.87 - 5.11 MIL/uL   Hemoglobin 10.1 (L) 12.0 - 15.0 g/dL   HCT 30.4 (L) 36.0 - 46.0 %   MCV 104.5 (H) 80.0 - 100.0 fL   MCH 34.7 (H) 26.0 - 34.0 pg   MCHC 33.2 30.0 - 36.0 g/dL   RDW 14.6 11.5 - 15.5 %   Platelets 188 150 - 400 K/uL   nRBC 0.0 0.0 - 0.2 %    Comment: Performed at [facility], [place] [address] [place], [place] [address]  Digoxin level     Status: None   Collection Time: 09/07/20  5:33 AM  Result Value Ref Range   Digoxin Level 0.8 0.8 - 2.0 ng/mL    Comment: Performed at [facility] [address], [place], [place] [address]    CT Head Wo Contrast  Result Date: 09/06/2020 CLINICAL DATA:  Trauma. EXAM: CT HEAD WITHOUT CONTRAST CT MAXILLOFACIAL WITHOUT CONTRAST CT CERVICAL SPINE WITHOUT CONTRAST TECHNIQUE: Multidetector CT imaging of the head, cervical spine, and maxillofacial structures were performed using the standard protocol without intravenous contrast. Multiplanar CT image  reconstructions of the  cervical spine and maxillofacial structures were also generated. COMPARISON:  CTA neck March 26 21.  CT head 03/29/2020. FINDINGS: CT HEAD FINDINGS Brain: No evidence of acute infarction, hemorrhage, hydrocephalus, extra-axial collection or mass lesion/mass effect. Similar mild for age patchy white matter hypoattenuation, compatible with the sequela of chronic microvascular ischemic disease. Similar mild for age atrophy. Vascular: Calcific atherosclerosis. No hyperdense vessel identified. Skull: Frontal scalp contusion/laceration without acute fracture. Other: No mastoid effusions. CT MAXILLOFACIAL FINDINGS Osseous: Right orbital floor fracture is described below. Slightly displaced right nasal bone fracture. Bilateral TMJ joints are located. Left greater than right TMJ degenerative change. Orbits: Acute right orbital floor fracture with adjacent extraconal retrobulbar hematoma. Small amount of fat herniates through the defect. The inferior rectus is separate from the fracture. The fracture extends across the infraorbital foramen. No proptosis. The globes are symmetric and within normal limits. Unremarkable appearance of the optic nerves and extraocular muscles. Sinuses: Right maxillary hemosinus. Scattered mucosal thickening of ethmoid air cells. Soft tissues: Right periorbital and nasal soft tissue contusions. CT CERVICAL SPINE FINDINGS Alignment: Prominent upper cervical kyphosis with anterolisthesis of C2 on C3 and C3 on C4, favored degenerative/chronic given similar alignment on prior CTA neck 2021. Skull base and vertebrae: No acute fracture. No primary bone lesion or focal pathologic process. Soft tissues and spinal canal: No prevertebral fluid or swelling. No visible canal hematoma. Disc levels: Advanced cervical degenerative change at C2-C3, C3-C4, C4-C5, and C5-C6. Degenerative disc disease is greatest on the right at C5-C6 where there is disc height loss, endplate sclerosis and spurring. Bony fusion  across the right C3-C4 facet joint. Multilevel posterior disc osteophyte complexes and facet/uncovertebral hypertrophy with varying degrees of canal and foraminal stenosis. Upper chest: Biapical pleuroparenchymal scarring with areas of calcification. Other: Calcific atherosclerosis. IMPRESSION: CT head: 1. No evidence of acute intracranial abnormality. Similar mild for age atrophy and chronic microvascular ischemic disease. 2. Frontal scalp contusion/laceration without acute calvarial fracture. CT maxillofacial: 1. Right periorbital soft tissue contusion with acute mildly displaced right orbital floor fracture and adjacent extraconal retrobulbar hematoma and right maxillary hemosinus. Small amount of herniated fat through the defect. The fracture extends across the infraorbital foramen. 2. Slightly displaced right nasal bone fracture with overlying nasal contusion. 3. Left greater than right TMJ degenerative change. CT cervical spine: 1. No evidence of acute fracture. 2. Prominent upper cervical kyphosis with anterolisthesis of C2 on C3 and C3 on C4, favored degenerative/chronic given similar alignment on prior CTA neck 2021. 3. Advanced upper cervical degenerative change with multilevel spinal and foraminal stenosis. An MRI could further evaluate the canal and foramina clinically indicated. Electronically Signed   By: Margaretha Sheffield MD   On: 09/06/2020 12:39   CT Cervical Spine Wo Contrast  Result Date: 09/06/2020 CLINICAL DATA:  Trauma. EXAM: CT HEAD WITHOUT CONTRAST CT MAXILLOFACIAL WITHOUT CONTRAST CT CERVICAL SPINE WITHOUT CONTRAST TECHNIQUE: Multidetector CT imaging of the head, cervical spine, and maxillofacial structures were performed using the standard protocol without intravenous contrast. Multiplanar CT image reconstructions of the cervical spine and maxillofacial structures were also generated. COMPARISON:  CTA neck March 26 21.  CT head 03/29/2020. FINDINGS: CT HEAD FINDINGS Brain: No evidence  of acute infarction, hemorrhage, hydrocephalus, extra-axial collection or mass lesion/mass effect. Similar mild for age patchy white matter hypoattenuation, compatible with the sequela of chronic microvascular ischemic disease. Similar mild for age atrophy. Vascular: Calcific atherosclerosis. No hyperdense vessel identified. Skull: Frontal scalp contusion/laceration without acute fracture. Other: No  mastoid effusions. CT MAXILLOFACIAL FINDINGS Osseous: Right orbital floor fracture is described below. Slightly displaced right nasal bone fracture. Bilateral TMJ joints are located. Left greater than right TMJ degenerative change. Orbits: Acute right orbital floor fracture with adjacent extraconal retrobulbar hematoma. Small amount of fat herniates through the defect. The inferior rectus is separate from the fracture. The fracture extends across the infraorbital foramen. No proptosis. The globes are symmetric and within normal limits. Unremarkable appearance of the optic nerves and extraocular muscles. Sinuses: Right maxillary hemosinus. Scattered mucosal thickening of ethmoid air cells. Soft tissues: Right periorbital and nasal soft tissue contusions. CT CERVICAL SPINE FINDINGS Alignment: Prominent upper cervical kyphosis with anterolisthesis of C2 on C3 and C3 on C4, favored degenerative/chronic given similar alignment on prior CTA neck 2021. Skull base and vertebrae: No acute fracture. No primary bone lesion or focal pathologic process. Soft tissues and spinal canal: No prevertebral fluid or swelling. No visible canal hematoma. Disc levels: Advanced cervical degenerative change at C2-C3, C3-C4, C4-C5, and C5-C6. Degenerative disc disease is greatest on the right at C5-C6 where there is disc height loss, endplate sclerosis and spurring. Bony fusion across the right C3-C4 facet joint. Multilevel posterior disc osteophyte complexes and facet/uncovertebral hypertrophy with varying degrees of canal and foraminal stenosis.  Upper chest: Biapical pleuroparenchymal scarring with areas of calcification. Other: Calcific atherosclerosis. IMPRESSION: CT head: 1. No evidence of acute intracranial abnormality. Similar mild for age atrophy and chronic microvascular ischemic disease. 2. Frontal scalp contusion/laceration without acute calvarial fracture. CT maxillofacial: 1. Right periorbital soft tissue contusion with acute mildly displaced right orbital floor fracture and adjacent extraconal retrobulbar hematoma and right maxillary hemosinus. Small amount of herniated fat through the defect. The fracture extends across the infraorbital foramen. 2. Slightly displaced right nasal bone fracture with overlying nasal contusion. 3. Left greater than right TMJ degenerative change. CT cervical spine: 1. No evidence of acute fracture. 2. Prominent upper cervical kyphosis with anterolisthesis of C2 on C3 and C3 on C4, favored degenerative/chronic given similar alignment on prior CTA neck 2021. 3. Advanced upper cervical degenerative change with multilevel spinal and foraminal stenosis. An MRI could further evaluate the canal and foramina clinically indicated. Electronically Signed   By: Margaretha Sheffield MD   On: 09/06/2020 12:39   DG Pelvis Portable  Result Date: 09/06/2020 CLINICAL DATA:  Fall. EXAM: PORTABLE PELVIS 1-2 VIEWS COMPARISON:  CT abdomen pelvis dated October 27, 2018. FINDINGS: There is no evidence of pelvic fracture or diastasis. No pelvic bone lesions are seen. Prior lumbar fusion. IMPRESSION: Negative. Electronically Signed   By: Titus Dubin M.D.   On: 09/06/2020 13:35   DG Chest Port 1 View  Result Date: 09/06/2020 CLINICAL DATA:  Fall.  No chest complaints. EXAM: PORTABLE CHEST 1 VIEW COMPARISON:  Chest x-ray dated March 31, 2020. FINDINGS: The heart size and mediastinal contours are within normal limits. Status post mitral and tricuspid valve replacements. Normal pulmonary vascularity. Mild biapical scarring again noted.  No focal consolidation, pleural effusion, or pneumothorax. No acute osseous abnormality. IMPRESSION: No active disease. Electronically Signed   By: Titus Dubin M.D.   On: 09/06/2020 13:34   DG Hand Complete Right  Result Date: 09/06/2020 CLINICAL DATA:  87 year old female status post fall with pain and tingling in the right thumb, 1st metacarpal. EXAM: RIGHT HAND - COMPLETE 3+ VIEW COMPARISON:  None. FINDINGS: Diffuse severe osteoarthritis in the right hand. Associated mild MCP and IP joint subluxations. No superimposed acute fracture or dislocation identified. The  1st metacarpal and phalanges appear intact. Mild to moderate 1st CMC joint degeneration. IMPRESSION: Diffuse severe osteoarthritis with no acute fracture or dislocation identified. Electronically Signed   By: Genevie Ann M.D.   On: 09/06/2020 11:12   CT Maxillofacial Wo Contrast  Result Date: 09/06/2020 CLINICAL DATA:  Trauma. EXAM: CT HEAD WITHOUT CONTRAST CT MAXILLOFACIAL WITHOUT CONTRAST CT CERVICAL SPINE WITHOUT CONTRAST TECHNIQUE: Multidetector CT imaging of the head, cervical spine, and maxillofacial structures were performed using the standard protocol without intravenous contrast. Multiplanar CT image reconstructions of the cervical spine and maxillofacial structures were also generated. COMPARISON:  CTA neck March 26 21.  CT head 03/29/2020. FINDINGS: CT HEAD FINDINGS Brain: No evidence of acute infarction, hemorrhage, hydrocephalus, extra-axial collection or mass lesion/mass effect. Similar mild for age patchy white matter hypoattenuation, compatible with the sequela of chronic microvascular ischemic disease. Similar mild for age atrophy. Vascular: Calcific atherosclerosis. No hyperdense vessel identified. Skull: Frontal scalp contusion/laceration without acute fracture. Other: No mastoid effusions. CT MAXILLOFACIAL FINDINGS Osseous: Right orbital floor fracture is described below. Slightly displaced right nasal bone fracture. Bilateral  TMJ joints are located. Left greater than right TMJ degenerative change. Orbits: Acute right orbital floor fracture with adjacent extraconal retrobulbar hematoma. Small amount of fat herniates through the defect. The inferior rectus is separate from the fracture. The fracture extends across the infraorbital foramen. No proptosis. The globes are symmetric and within normal limits. Unremarkable appearance of the optic nerves and extraocular muscles. Sinuses: Right maxillary hemosinus. Scattered mucosal thickening of ethmoid air cells. Soft tissues: Right periorbital and nasal soft tissue contusions. CT CERVICAL SPINE FINDINGS Alignment: Prominent upper cervical kyphosis with anterolisthesis of C2 on C3 and C3 on C4, favored degenerative/chronic given similar alignment on prior CTA neck 2021. Skull base and vertebrae: No acute fracture. No primary bone lesion or focal pathologic process. Soft tissues and spinal canal: No prevertebral fluid or swelling. No visible canal hematoma. Disc levels: Advanced cervical degenerative change at C2-C3, C3-C4, C4-C5, and C5-C6. Degenerative disc disease is greatest on the right at C5-C6 where there is disc height loss, endplate sclerosis and spurring. Bony fusion across the right C3-C4 facet joint. Multilevel posterior disc osteophyte complexes and facet/uncovertebral hypertrophy with varying degrees of canal and foraminal stenosis. Upper chest: Biapical pleuroparenchymal scarring with areas of calcification. Other: Calcific atherosclerosis. IMPRESSION: CT head: 1. No evidence of acute intracranial abnormality. Similar mild for age atrophy and chronic microvascular ischemic disease. 2. Frontal scalp contusion/laceration without acute calvarial fracture. CT maxillofacial: 1. Right periorbital soft tissue contusion with acute mildly displaced right orbital floor fracture and adjacent extraconal retrobulbar hematoma and right maxillary hemosinus. Small amount of herniated fat through  the defect. The fracture extends across the infraorbital foramen. 2. Slightly displaced right nasal bone fracture with overlying nasal contusion. 3. Left greater than right TMJ degenerative change. CT cervical spine: 1. No evidence of acute fracture. 2. Prominent upper cervical kyphosis with anterolisthesis of C2 on C3 and C3 on C4, favored degenerative/chronic given similar alignment on prior CTA neck 2021. 3. Advanced upper cervical degenerative change with multilevel spinal and foraminal stenosis. An MRI could further evaluate the canal and foramina clinically indicated. Electronically Signed   By: Margaretha Sheffield MD   On: 09/06/2020 12:39    ROS:ROS  Blood pressure 137/88, pulse (!) 107, temperature 98.3 F (36.8 C), temperature source Oral, resp. rate 17, SpO2 96 %.  PHYSICAL EXAM: CONSTITUTIONAL: Pleasant well developed, well nourished female, in no acute distress, and alert and  oriented x 3 PULMONARY/CHEST WALL: effort normal and no stridor, no stertor, no dysphonia HENT: Head : Fairly extensive bilateral periorbital and nasal ecchymosis/edema, as well as small abrasion of forehead on the left Ears: Right ear:   canal normal, external ear normal, normal external auditory canal, tympanic membrane intact with no evidence of effusion or hemotympanum  Left ear:   canal normal, external ear normal,  normal external auditory canal, tympanic membrane intact with no evidence of effusion or hemotympanum  Nose: Edema over nasal bridge, no palpable step-off deformity noted bilaterally, however exam somewhat limited secondary to edema.  Nasal dorsum appears relatively midline.  Mild left septal deviation noted, appears chronic, no evidence of septal hematoma, no active intranasal bleeding Mouth/Throat:  Mouth: uvula midline and no oral lesions Throat: oropharynx clear and moist Mucous membranes: normal EYES: Periorbital ecchymosis as previously noted, conjunctiva normal, EOM normal bilaterally, PERRL  bilaterally, no proptosis, no enophthalmos  NECK: supple, trachea normal and no thyromegaly or cervical LAD NEURO: Normal sensation in V2 distribution, facial nerve intact bilaterally  Studies Reviewed: Maxillofacial CT Scan reviewed, minimally displaced nasal fracture noted.  Right inferior orbital floor fracture with small amount of fat herniation, no evidence of entrapment, right retrobulbar hematoma, opacification of right maxillary sinus,   Assessment/Plan: Kathy Velez is a 87 y/o F with extensive PMH including atrial fibrillation, anticoagulated on Eliquis, presenting after fall from ground level with right orbital floor fracture with fat herniation but no entrapment, minimally displaced right nasal bone fracture and facial ecchymosis  - Eliquis held, reversed by primary team.  Ophthalmology consulted for recommendations due to presence of extraconal retrobulbar hematoma.  Patient reports normal vision. No enophthalmos, no proptosis, normal extraocular movement on examination.  Recommend continued conservative management of orbital floor fracture at this time, will follow-up ophthalmology recommendations.  - No evidence of septal hematoma on exam, no active nasal bleeding. Advise conservative management of minimally displaced nasal bone fracture: application of ice packs frequently, no nose blowing, use of saline nasal spray, Afrin PRN for bleeding. - Outpatient follow-up with ENT in 3-4 weeks for re-check or sooner if needed   Thank you for allowing me to participate in the care of this patient. Please do not hesitate to contact me with any questions or concerns.   Jason Coop, [facility] Cell: [phone]   09/07/2020, 11:06 AM

## 2020-09-07 NOTE — Progress Notes (Signed)
PROGRESS NOTE    Kathy Velez  DGL:[id number] DOB: 1932/02/01 DOA: 09/06/2020 PCP: Virgie Dad, MD   Brief Narrative:  Kathy Velez is a 87 y.o. female with history of A. fib on Eliquis digoxin beta-blockers, hypertension, rheumatoid arthritis, history of mitral valve and tricuspid valve repair had a fall at her facility.  Patient states she was trying to get up from the bed when she fell.  Fell onto her face on the floor with minimal guarding. In the ER CT scan shows acute mildly displaced right orbital floor fracture and adjacent extraconal Retrobulbar hematoma and right maxillary hemosinus. Slightly displaced right nasal bone fracture with overlying nasal contusion.   Trauma, ophthalmology and ENT were consulted at intake. Eliquis reversed with Kcentra at intake.   Assessment & Plan:   Principal Problem:   Periorbital hematoma of right eye Active Problems:   Atrial flutter (HCC)   Status post mitral valve repair   Status post tricuspid valve repair   DNR (do not resuscitate)   Chronic anticoagulation   Closed fracture of nasal bones   Closed fracture of orbit (HCC)   Retrobulbar hematoma   Periorbital hematoma   Acute mechanical fall with right orbital fracture nasal fracture and remarkable hematoma  -Trauma, ENT, ophthalmology following -appreciate insight and recommendations -Continue to hold anticoagulation in the setting of hematoma/profound ecchymoses  Afib rate controlled chronically on Eliquis  - Patient was given Kcentra; Eliquis on hold.  - On beta-blockers and digoxin digoxin levels are pending.  Hypertension  -Continue beta-blocker, ARB  Anemia, likely macrocytic with chronic anemia of chronic disease -Hemoglobin stable  History of rheumatoid arthritis on Enbrel.  History of mitral valve and tricuspid valve repair.     DVT prophylaxis: SCDs.  Avoiding anticoagulation in setting of hematoma involving the right orbital area. Code Status: DNR. Family  Communication: Daughter at bedside  Status is: Inpatient  Dispo: The patient is from: Facility              Anticipated d/c is to: Same              Anticipated d/c date is: 24 to 48 hours              Patient currently not medically stable for discharge due to need for ongoing work-up, possible repeat imaging and profound bleeding  Consultants:  Trauma, ophthalmology, ENT  Procedures:  None  Antimicrobials:  None indicated  Subjective: No acute issues or events overnight, right arm and wrist pain improving but not yet resolved, otherwise denies nausea vomiting diarrhea constipation headache fevers chills shortness of breath chest pain.  Objective: Vitals:   09/06/20 2045 09/06/20 2134 09/07/20 0307 09/07/20 0625  BP: (!) 170/76 (!) 154/89 (!) 184/98 (!) 174/85  Pulse: 96 86 85 73  Resp: (!) 26 15 15 16   Temp: 98.1 F (36.7 C) 98.2 F (36.8 C) 98.5 F (36.9 C) 98.4 F (36.9 C)  TempSrc: Oral Oral Oral Oral  SpO2: 93% 95% 96% 97%    Intake/Output Summary (Last 24 hours) at 09/07/2020 0723 Last data filed at 09/07/2020 0630 Gross per 24 hour  Intake 142.03 ml  Output 700 ml  Net -557.97 ml   There were no vitals filed for this visit.  Examination:  General:  Pleasantly resting in bed, No acute distress. HEENT: Bilateral right greater than left infraorbital ecchymoses and right-sided hematoma, deviation of the nasal bone laterally to the right, 1 cm laceration midline at the hairline -no active bleeding  noted Neck:  Without mass or deformity.  Trachea is midline. Lungs:  Clear to auscultate bilaterally without rhonchi, wheeze, or rales. Heart: Irregularly irregular without overt murmurs rubs or gallops. Abdomen:  Soft, nontender, nondistended.  Without guarding or rebound. Extremities: Without cyanosis, clubbing, edema, or obvious deformity. Vascular:  Dorsalis pedis and posterior tibial pulses palpable bilaterally. Skin:  Warm and dry, no erythema, no  ulcerations.   Data Reviewed: I have personally reviewed following labs and imaging studies  CBC: Recent Labs  Lab 09/06/20 1127 09/07/20 0037  WBC 10.1 8.5  HGB 10.3* 10.1*  HCT 30.6* 30.4*  MCV 103.0* 104.5*  PLT 172 409   Basic Metabolic Panel: Recent Labs  Lab 09/06/20 1127 09/07/20 0037  NA 135 138  K 4.0 4.3  CL 102 102  CO2 25 26  GLUCOSE 114* 96  BUN 19 15  CREATININE 0.75 0.85  CALCIUM 9.2 9.1   GFR: Estimated Creatinine Clearance: 37.6 mL/min (by C-G formula based on SCr of 0.85 mg/dL). Liver Function Tests: No results for input(s): AST, ALT, ALKPHOS, BILITOT, PROT, ALBUMIN in the last 168 hours. No results for input(s): LIPASE, AMYLASE in the last 168 hours. No results for input(s): AMMONIA in the last 168 hours. Coagulation Profile: No results for input(s): INR, PROTIME in the last 168 hours. Cardiac Enzymes: No results for input(s): CKTOTAL, CKMB, CKMBINDEX, TROPONINI in the last 168 hours. BNP (last 3 results) No results for input(s): PROBNP in the last 8760 hours. HbA1C: No results for input(s): HGBA1C in the last 72 hours. CBG: No results for input(s): GLUCAP in the last 168 hours. Lipid Profile: No results for input(s): CHOL, HDL, LDLCALC, TRIG, CHOLHDL, LDLDIRECT in the last 72 hours. Thyroid Function Tests: No results for input(s): TSH, T4TOTAL, FREET4, T3FREE, THYROIDAB in the last 72 hours. Anemia Panel: No results for input(s): VITAMINB12, FOLATE, FERRITIN, TIBC, IRON, RETICCTPCT in the last 72 hours. Sepsis Labs: No results for input(s): PROCALCITON, LATICACIDVEN in the last 168 hours.  Recent Results (from the past 240 hour(s))  Resp Panel by RT-PCR (Flu A&B, Covid) Nasopharyngeal Swab     Status: None   Collection Time: 09/06/20  1:17 PM   Specimen: Nasopharyngeal Swab; Nasopharyngeal(NP) swabs in vial transport medium  Result Value Ref Range Status   SARS Coronavirus 2 by RT PCR NEGATIVE NEGATIVE Final    Comment:  (NOTE) SARS-CoV-2 target nucleic acids are NOT DETECTED.  The SARS-CoV-2 RNA is generally detectable in upper respiratory specimens during the acute phase of infection. The lowest concentration of SARS-CoV-2 viral copies this assay can detect is 138 copies/mL. A negative result does not preclude SARS-Cov-2 infection and should not be used as the sole basis for treatment or other patient management decisions. A negative result may occur with  improper specimen collection/handling, submission of specimen other than nasopharyngeal swab, presence of viral mutation(s) within the areas targeted by this assay, and inadequate number of viral copies(<138 copies/mL). A negative result must be combined with clinical observations, patient history, and epidemiological information. The expected result is Negative.  Fact Sheet for Patients:  EntrepreneurPulse.com.au  Fact Sheet for Healthcare Providers:  IncredibleEmployment.be  This test is no t yet approved or cleared by the [place] FDA and  has been authorized for detection and/or diagnosis of SARS-CoV-2 by FDA under an Emergency Use Authorization (EUA). This EUA will remain  in effect (meaning this test can be used) for the duration of the COVID-19 declaration under Section 564(b)(1) of the Act, 21 U.S.C.section  360bbb-3(b)(1), unless the authorization is terminated  or revoked sooner.       Influenza A by PCR NEGATIVE NEGATIVE Final   Influenza B by PCR NEGATIVE NEGATIVE Final    Comment: (NOTE) The Xpert Xpress SARS-CoV-2/FLU/RSV plus assay is intended as an aid in the diagnosis of influenza from Nasopharyngeal swab specimens and should not be used as a sole basis for treatment. Nasal washings and aspirates are unacceptable for Xpert Xpress SARS-CoV-2/FLU/RSV testing.  Fact Sheet for Patients: EntrepreneurPulse.com.au  Fact Sheet for Healthcare  Providers: IncredibleEmployment.be  This test is not yet approved or cleared by the [place] FDA and has been authorized for detection and/or diagnosis of SARS-CoV-2 by FDA under an Emergency Use Authorization (EUA). This EUA will remain in effect (meaning this test can be used) for the duration of the COVID-19 declaration under Section 564(b)(1) of the Act, 21 U.S.C. section 360bbb-3(b)(1), unless the authorization is terminated or revoked.  Performed at [facility], [address], [place], [place] [address]          Radiology Studies: CT Head Wo Contrast  Result Date: 09/06/2020 CLINICAL DATA:  Trauma. EXAM: CT HEAD WITHOUT CONTRAST CT MAXILLOFACIAL WITHOUT CONTRAST CT CERVICAL SPINE WITHOUT CONTRAST TECHNIQUE: Multidetector CT imaging of the head, cervical spine, and maxillofacial structures were performed using the standard protocol without intravenous contrast. Multiplanar CT image reconstructions of the cervical spine and maxillofacial structures were also generated. COMPARISON:  CTA neck March 26 21.  CT head 03/29/2020. FINDINGS: CT HEAD FINDINGS Brain: No evidence of acute infarction, hemorrhage, hydrocephalus, extra-axial collection or mass lesion/mass effect. Similar mild for age patchy white matter hypoattenuation, compatible with the sequela of chronic microvascular ischemic disease. Similar mild for age atrophy. Vascular: Calcific atherosclerosis. No hyperdense vessel identified. Skull: Frontal scalp contusion/laceration without acute fracture. Other: No mastoid effusions. CT MAXILLOFACIAL FINDINGS Osseous: Right orbital floor fracture is described below. Slightly displaced right nasal bone fracture. Bilateral TMJ joints are located. Left greater than right TMJ degenerative change. Orbits: Acute right orbital floor fracture with adjacent extraconal retrobulbar hematoma. Small amount of fat herniates through the defect. The inferior  rectus is separate from the fracture. The fracture extends across the infraorbital foramen. No proptosis. The globes are symmetric and within normal limits. Unremarkable appearance of the optic nerves and extraocular muscles. Sinuses: Right maxillary hemosinus. Scattered mucosal thickening of ethmoid air cells. Soft tissues: Right periorbital and nasal soft tissue contusions. CT CERVICAL SPINE FINDINGS Alignment: Prominent upper cervical kyphosis with anterolisthesis of C2 on C3 and C3 on C4, favored degenerative/chronic given similar alignment on prior CTA neck 2021. Skull base and vertebrae: No acute fracture. No primary bone lesion or focal pathologic process. Soft tissues and spinal canal: No prevertebral fluid or swelling. No visible canal hematoma. Disc levels: Advanced cervical degenerative change at C2-C3, C3-C4, C4-C5, and C5-C6. Degenerative disc disease is greatest on the right at C5-C6 where there is disc height loss, endplate sclerosis and spurring. Bony fusion across the right C3-C4 facet joint. Multilevel posterior disc osteophyte complexes and facet/uncovertebral hypertrophy with varying degrees of canal and foraminal stenosis. Upper chest: Biapical pleuroparenchymal scarring with areas of calcification. Other: Calcific atherosclerosis. IMPRESSION: CT head: 1. No evidence of acute intracranial abnormality. Similar mild for age atrophy and chronic microvascular ischemic disease. 2. Frontal scalp contusion/laceration without acute calvarial fracture. CT maxillofacial: 1. Right periorbital soft tissue contusion with acute mildly displaced right orbital floor fracture and adjacent extraconal retrobulbar hematoma and right maxillary hemosinus. Small amount of  herniated fat through the defect. The fracture extends across the infraorbital foramen. 2. Slightly displaced right nasal bone fracture with overlying nasal contusion. 3. Left greater than right TMJ degenerative change. CT cervical spine: 1. No  evidence of acute fracture. 2. Prominent upper cervical kyphosis with anterolisthesis of C2 on C3 and C3 on C4, favored degenerative/chronic given similar alignment on prior CTA neck 2021. 3. Advanced upper cervical degenerative change with multilevel spinal and foraminal stenosis. An MRI could further evaluate the canal and foramina clinically indicated. Electronically Signed   By: Margaretha Sheffield MD   On: 09/06/2020 12:39   CT Cervical Spine Wo Contrast  Result Date: 09/06/2020 CLINICAL DATA:  Trauma. EXAM: CT HEAD WITHOUT CONTRAST CT MAXILLOFACIAL WITHOUT CONTRAST CT CERVICAL SPINE WITHOUT CONTRAST TECHNIQUE: Multidetector CT imaging of the head, cervical spine, and maxillofacial structures were performed using the standard protocol without intravenous contrast. Multiplanar CT image reconstructions of the cervical spine and maxillofacial structures were also generated. COMPARISON:  CTA neck March 26 21.  CT head 03/29/2020. FINDINGS: CT HEAD FINDINGS Brain: No evidence of acute infarction, hemorrhage, hydrocephalus, extra-axial collection or mass lesion/mass effect. Similar mild for age patchy white matter hypoattenuation, compatible with the sequela of chronic microvascular ischemic disease. Similar mild for age atrophy. Vascular: Calcific atherosclerosis. No hyperdense vessel identified. Skull: Frontal scalp contusion/laceration without acute fracture. Other: No mastoid effusions. CT MAXILLOFACIAL FINDINGS Osseous: Right orbital floor fracture is described below. Slightly displaced right nasal bone fracture. Bilateral TMJ joints are located. Left greater than right TMJ degenerative change. Orbits: Acute right orbital floor fracture with adjacent extraconal retrobulbar hematoma. Small amount of fat herniates through the defect. The inferior rectus is separate from the fracture. The fracture extends across the infraorbital foramen. No proptosis. The globes are symmetric and within normal limits.  Unremarkable appearance of the optic nerves and extraocular muscles. Sinuses: Right maxillary hemosinus. Scattered mucosal thickening of ethmoid air cells. Soft tissues: Right periorbital and nasal soft tissue contusions. CT CERVICAL SPINE FINDINGS Alignment: Prominent upper cervical kyphosis with anterolisthesis of C2 on C3 and C3 on C4, favored degenerative/chronic given similar alignment on prior CTA neck 2021. Skull base and vertebrae: No acute fracture. No primary bone lesion or focal pathologic process. Soft tissues and spinal canal: No prevertebral fluid or swelling. No visible canal hematoma. Disc levels: Advanced cervical degenerative change at C2-C3, C3-C4, C4-C5, and C5-C6. Degenerative disc disease is greatest on the right at C5-C6 where there is disc height loss, endplate sclerosis and spurring. Bony fusion across the right C3-C4 facet joint. Multilevel posterior disc osteophyte complexes and facet/uncovertebral hypertrophy with varying degrees of canal and foraminal stenosis. Upper chest: Biapical pleuroparenchymal scarring with areas of calcification. Other: Calcific atherosclerosis. IMPRESSION: CT head: 1. No evidence of acute intracranial abnormality. Similar mild for age atrophy and chronic microvascular ischemic disease. 2. Frontal scalp contusion/laceration without acute calvarial fracture. CT maxillofacial: 1. Right periorbital soft tissue contusion with acute mildly displaced right orbital floor fracture and adjacent extraconal retrobulbar hematoma and right maxillary hemosinus. Small amount of herniated fat through the defect. The fracture extends across the infraorbital foramen. 2. Slightly displaced right nasal bone fracture with overlying nasal contusion. 3. Left greater than right TMJ degenerative change. CT cervical spine: 1. No evidence of acute fracture. 2. Prominent upper cervical kyphosis with anterolisthesis of C2 on C3 and C3 on C4, favored degenerative/chronic given similar  alignment on prior CTA neck 2021. 3. Advanced upper cervical degenerative change with multilevel spinal and foraminal stenosis. An  MRI could further evaluate the canal and foramina clinically indicated. Electronically Signed   By: Margaretha Sheffield MD   On: 09/06/2020 12:39   DG Pelvis Portable  Result Date: 09/06/2020 CLINICAL DATA:  Fall. EXAM: PORTABLE PELVIS 1-2 VIEWS COMPARISON:  CT abdomen pelvis dated October 27, 2018. FINDINGS: There is no evidence of pelvic fracture or diastasis. No pelvic bone lesions are seen. Prior lumbar fusion. IMPRESSION: Negative. Electronically Signed   By: Titus Dubin M.D.   On: 09/06/2020 13:35   DG Chest Port 1 View  Result Date: 09/06/2020 CLINICAL DATA:  Fall.  No chest complaints. EXAM: PORTABLE CHEST 1 VIEW COMPARISON:  Chest x-ray dated March 31, 2020. FINDINGS: The heart size and mediastinal contours are within normal limits. Status post mitral and tricuspid valve replacements. Normal pulmonary vascularity. Mild biapical scarring again noted. No focal consolidation, pleural effusion, or pneumothorax. No acute osseous abnormality. IMPRESSION: No active disease. Electronically Signed   By: Titus Dubin M.D.   On: 09/06/2020 13:34   DG Hand Complete Right  Result Date: 09/06/2020 CLINICAL DATA:  87 year old female status post fall with pain and tingling in the right thumb, 1st metacarpal. EXAM: RIGHT HAND - COMPLETE 3+ VIEW COMPARISON:  None. FINDINGS: Diffuse severe osteoarthritis in the right hand. Associated mild MCP and IP joint subluxations. No superimposed acute fracture or dislocation identified. The 1st metacarpal and phalanges appear intact. Mild to moderate 1st CMC joint degeneration. IMPRESSION: Diffuse severe osteoarthritis with no acute fracture or dislocation identified. Electronically Signed   By: Genevie Ann M.D.   On: 09/06/2020 11:12   CT Maxillofacial Wo Contrast  Result Date: 09/06/2020 CLINICAL DATA:  Trauma. EXAM: CT HEAD WITHOUT  CONTRAST CT MAXILLOFACIAL WITHOUT CONTRAST CT CERVICAL SPINE WITHOUT CONTRAST TECHNIQUE: Multidetector CT imaging of the head, cervical spine, and maxillofacial structures were performed using the standard protocol without intravenous contrast. Multiplanar CT image reconstructions of the cervical spine and maxillofacial structures were also generated. COMPARISON:  CTA neck March 26 21.  CT head 03/29/2020. FINDINGS: CT HEAD FINDINGS Brain: No evidence of acute infarction, hemorrhage, hydrocephalus, extra-axial collection or mass lesion/mass effect. Similar mild for age patchy white matter hypoattenuation, compatible with the sequela of chronic microvascular ischemic disease. Similar mild for age atrophy. Vascular: Calcific atherosclerosis. No hyperdense vessel identified. Skull: Frontal scalp contusion/laceration without acute fracture. Other: No mastoid effusions. CT MAXILLOFACIAL FINDINGS Osseous: Right orbital floor fracture is described below. Slightly displaced right nasal bone fracture. Bilateral TMJ joints are located. Left greater than right TMJ degenerative change. Orbits: Acute right orbital floor fracture with adjacent extraconal retrobulbar hematoma. Small amount of fat herniates through the defect. The inferior rectus is separate from the fracture. The fracture extends across the infraorbital foramen. No proptosis. The globes are symmetric and within normal limits. Unremarkable appearance of the optic nerves and extraocular muscles. Sinuses: Right maxillary hemosinus. Scattered mucosal thickening of ethmoid air cells. Soft tissues: Right periorbital and nasal soft tissue contusions. CT CERVICAL SPINE FINDINGS Alignment: Prominent upper cervical kyphosis with anterolisthesis of C2 on C3 and C3 on C4, favored degenerative/chronic given similar alignment on prior CTA neck 2021. Skull base and vertebrae: No acute fracture. No primary bone lesion or focal pathologic process. Soft tissues and spinal canal: No  prevertebral fluid or swelling. No visible canal hematoma. Disc levels: Advanced cervical degenerative change at C2-C3, C3-C4, C4-C5, and C5-C6. Degenerative disc disease is greatest on the right at C5-C6 where there is disc height loss, endplate sclerosis and spurring. Bony fusion  across the right C3-C4 facet joint. Multilevel posterior disc osteophyte complexes and facet/uncovertebral hypertrophy with varying degrees of canal and foraminal stenosis. Upper chest: Biapical pleuroparenchymal scarring with areas of calcification. Other: Calcific atherosclerosis. IMPRESSION: CT head: 1. No evidence of acute intracranial abnormality. Similar mild for age atrophy and chronic microvascular ischemic disease. 2. Frontal scalp contusion/laceration without acute calvarial fracture. CT maxillofacial: 1. Right periorbital soft tissue contusion with acute mildly displaced right orbital floor fracture and adjacent extraconal retrobulbar hematoma and right maxillary hemosinus. Small amount of herniated fat through the defect. The fracture extends across the infraorbital foramen. 2. Slightly displaced right nasal bone fracture with overlying nasal contusion. 3. Left greater than right TMJ degenerative change. CT cervical spine: 1. No evidence of acute fracture. 2. Prominent upper cervical kyphosis with anterolisthesis of C2 on C3 and C3 on C4, favored degenerative/chronic given similar alignment on prior CTA neck 2021. 3. Advanced upper cervical degenerative change with multilevel spinal and foraminal stenosis. An MRI could further evaluate the canal and foramina clinically indicated. Electronically Signed   By: Margaretha Sheffield MD   On: 09/06/2020 12:39    Scheduled Meds:  atorvastatin  20 mg Oral Daily   digoxin  125 mcg Oral Daily   irbesartan  75 mg Oral Daily   levothyroxine  75 mcg Oral Q0600   metoprolol succinate  50 mg Oral Daily   mirtazapine  15 mg Oral QHS   pantoprazole  40 mg Oral Daily   vitamin B-12   1,000 mcg Oral Daily    LOS: 1 day   Time spent: 26min  Konni Kesinger C Obryan Radu, DO [facility]  If 7PM-7AM, please contact night-coverage [email or web address]  09/07/2020, 7:23 AM

## 2020-09-08 DIAGNOSIS — H05231 Hemorrhage of right orbit: Secondary | ICD-10-CM | POA: Diagnosis not present

## 2020-09-08 MED ORDER — ACETAMINOPHEN 325 MG PO TABS: 650.0000 mg | ORAL_TABLET | Freq: Four times a day (QID) | ORAL | 0 refills | Status: DC | PRN

## 2020-09-08 MED ORDER — SALINE SPRAY 0.65 % NA SOLN: 2.0000 | NASAL | 0 refills | Status: DC | PRN

## 2020-09-08 MED ORDER — OXYMETAZOLINE HCL 0.05 % NA SOLN: 2.0000 | Freq: Two times a day (BID) | NASAL | 0 refills | Status: DC | PRN

## 2020-09-08 NOTE — Discharge Summary (Signed)
Physician Discharge Summary  Kathy Velez HYQ:[id number] DOB: 07-15-1931 DOA: 09/06/2020  PCP: Virgie Dad, MD  Admit date: 09/06/2020 Discharge date: 09/08/2020  Admitted From: Facility Disposition: Facility  Recommendations for Outpatient Follow-up:  Follow up with PCP in 1-2 weeks Please obtain BMP/CBC in one week Please follow up with ENT as scheduled in the 3-4 few weeks  Home Health: None Equipment/Devices: None  Discharge Condition: Stable CODE STATUS: DNR Diet recommendation: Regular diet as tolerated  Brief/Interim Summary: Kathy Velez is a 87 y.o. female with history of A. fib on Eliquis digoxin beta-blockers, hypertension, rheumatoid arthritis, history of mitral valve and tricuspid valve repair had a fall at her facility.  Patient states she was trying to get up from the bed when she fell.  Fell onto her face on the floor with minimal guarding. In the ER CT scan shows acute mildly displaced right orbital floor fracture and adjacent extraconal Retrobulbar hematoma and right maxillary hemosinus. Slightly displaced right nasal bone fracture with overlying nasal contusion.   Trauma, ophthalmology and ENT were consulted at intake. Eliquis reversed with Kcentra at intake.  Given multiple facial fractures and hematoma ENT ophthalmology, or following as above, fortunately patient's hematoma stabilized she has no vision changes from baseline and otherwise no acute indication for intervention or surgery at this time and will continue medical management and pain control.  Patient cleared to resume home Eliquis at discharge.  Patient evaluated by PT OT who recommended ongoing supervision for mobility over the next few days but otherwise no indications for physical therapy at this time.  Patient otherwise stable and agreeable for discharge home.  Discharge Diagnoses:  Principal Problem:   Periorbital hematoma of right eye Active Problems:   Atrial flutter (HCC)   Status post mitral valve  repair   Status post tricuspid valve repair   DNR (do not resuscitate)   Chronic anticoagulation   Closed fracture of nasal bones   Closed fracture of orbit (HCC)   Retrobulbar hematoma   Periorbital hematoma    Discharge Instructions  Discharge Instructions     Call MD for:  persistant dizziness or light-headedness   Complete by: As directed    Call MD for:  redness, tenderness, or signs of infection (pain, swelling, redness, odor or green/yellow discharge around incision site)   Complete by: As directed    Call MD for:  severe uncontrolled pain   Complete by: As directed    Diet general   Complete by: As directed    Discharge instructions   Complete by: As directed    Supervision for mobility per PT; otherwise return to previous level of activity - DO NOT BLOW YOUR NOSE or use a straw until after follow up with ENT in a few weeks or until otherwise directed.   Increase activity slowly   Complete by: As directed       Allergies as of 09/08/2020       Reactions   Codeine Other (See Comments)   "just don't take it well"   Molds & Smuts    Pollen Extract Swelling   Bee Venom Swelling, Rash        Medication List     STOP taking these medications    Co Q 10 60 MG Caps       TAKE these medications    acetaminophen 325 MG tablet Commonly known as: TYLENOL Take 2 tablets (650 mg total) by mouth every 6 (six) hours as needed for mild pain or  fever. What changed:  medication strength how much to take when to take this reasons to take this   apixaban 2.5 MG Tabs tablet Commonly known as: Eliquis Take 1 tablet (2.5 mg total) by mouth 2 (two) times daily.   atorvastatin 20 MG tablet Commonly known as: LIPITOR TAKE 1 TABLET DAILY   digoxin 0.125 MG tablet Commonly known as: LANOXIN TAKE 1 TABLET DAILY   Enbrel SureClick 50 MG/ML injection Generic drug: etanercept   gabapentin 100 MG capsule Commonly known as: Neurontin Take 1 capsule (100 mg total)  by mouth at bedtime. Try one capsule at bedtime, if no improvement after 1 week can try two capsules at bedtime.   leflunomide 20 MG tablet Commonly known as: ARAVA Take 20 mg by mouth daily.   Metamucil [employer] Fiber 63 % Powd Generic drug: Psyllium Take 1 Scoop by mouth in the morning.   metoprolol succinate 50 MG 24 hr tablet Commonly known as: TOPROL-XL TAKE 1 TABLET DAILY   mirtazapine 15 MG tablet Commonly known as: REMERON Take 1 tablet (15 mg total) by mouth at bedtime.   omeprazole 40 MG capsule Commonly known as: PRILOSEC Take 1 capsule (40 mg total) by mouth daily.   oxymetazoline 0.05 % nasal spray Commonly known as: AFRIN Place 2 sprays into both nostrils 2 (two) times daily as needed for congestion (Bleeding).   PreserVision AREDS 2 Caps Take 1 Dose by mouth 2 (two) times daily. For eye health   sodium chloride 0.65 % Soln nasal spray Commonly known as: OCEAN Place 2 sprays into both nostrils every 4 (four) hours as needed for congestion.   Synthroid 75 MCG tablet Generic drug: levothyroxine TAKE 1 TABLET DAILY BEFORE BREAKFAST FOR HYPOTHYROIDISM   valsartan 80 MG tablet Commonly known as: DIOVAN TAKE 1 TABLET DAILY   vitamin B-12 1000 MCG tablet Commonly known as: CYANOCOBALAMIN Take 1,000 mcg by mouth daily.   Vitamin D 50 MCG (2000 UT) Caps Take 1 capsule by mouth daily.        Allergies  Allergen Reactions   Codeine Other (See Comments)    "just don't take it well"   Molds & Smuts    Pollen Extract Swelling   Bee Venom Swelling and Rash    Consultations: Ophthalmology, ENT, trauma surgery  Procedures/Studies: CT Head Wo Contrast  Result Date: 09/06/2020 CLINICAL DATA:  Trauma. EXAM: CT HEAD WITHOUT CONTRAST CT MAXILLOFACIAL WITHOUT CONTRAST CT CERVICAL SPINE WITHOUT CONTRAST TECHNIQUE: Multidetector CT imaging of the head, cervical spine, and maxillofacial structures were performed using the standard protocol without intravenous  contrast. Multiplanar CT image reconstructions of the cervical spine and maxillofacial structures were also generated. COMPARISON:  CTA neck March 26 21.  CT head 03/29/2020. FINDINGS: CT HEAD FINDINGS Brain: No evidence of acute infarction, hemorrhage, hydrocephalus, extra-axial collection or mass lesion/mass effect. Similar mild for age patchy white matter hypoattenuation, compatible with the sequela of chronic microvascular ischemic disease. Similar mild for age atrophy. Vascular: Calcific atherosclerosis. No hyperdense vessel identified. Skull: Frontal scalp contusion/laceration without acute fracture. Other: No mastoid effusions. CT MAXILLOFACIAL FINDINGS Osseous: Right orbital floor fracture is described below. Slightly displaced right nasal bone fracture. Bilateral TMJ joints are located. Left greater than right TMJ degenerative change. Orbits: Acute right orbital floor fracture with adjacent extraconal retrobulbar hematoma. Small amount of fat herniates through the defect. The inferior rectus is separate from the fracture. The fracture extends across the infraorbital foramen. No proptosis. The globes are symmetric and within normal limits. Unremarkable  appearance of the optic nerves and extraocular muscles. Sinuses: Right maxillary hemosinus. Scattered mucosal thickening of ethmoid air cells. Soft tissues: Right periorbital and nasal soft tissue contusions. CT CERVICAL SPINE FINDINGS Alignment: Prominent upper cervical kyphosis with anterolisthesis of C2 on C3 and C3 on C4, favored degenerative/chronic given similar alignment on prior CTA neck 2021. Skull base and vertebrae: No acute fracture. No primary bone lesion or focal pathologic process. Soft tissues and spinal canal: No prevertebral fluid or swelling. No visible canal hematoma. Disc levels: Advanced cervical degenerative change at C2-C3, C3-C4, C4-C5, and C5-C6. Degenerative disc disease is greatest on the right at C5-C6 where there is disc height  loss, endplate sclerosis and spurring. Bony fusion across the right C3-C4 facet joint. Multilevel posterior disc osteophyte complexes and facet/uncovertebral hypertrophy with varying degrees of canal and foraminal stenosis. Upper chest: Biapical pleuroparenchymal scarring with areas of calcification. Other: Calcific atherosclerosis. IMPRESSION: CT head: 1. No evidence of acute intracranial abnormality. Similar mild for age atrophy and chronic microvascular ischemic disease. 2. Frontal scalp contusion/laceration without acute calvarial fracture. CT maxillofacial: 1. Right periorbital soft tissue contusion with acute mildly displaced right orbital floor fracture and adjacent extraconal retrobulbar hematoma and right maxillary hemosinus. Small amount of herniated fat through the defect. The fracture extends across the infraorbital foramen. 2. Slightly displaced right nasal bone fracture with overlying nasal contusion. 3. Left greater than right TMJ degenerative change. CT cervical spine: 1. No evidence of acute fracture. 2. Prominent upper cervical kyphosis with anterolisthesis of C2 on C3 and C3 on C4, favored degenerative/chronic given similar alignment on prior CTA neck 2021. 3. Advanced upper cervical degenerative change with multilevel spinal and foraminal stenosis. An MRI could further evaluate the canal and foramina clinically indicated. Electronically Signed   By: Margaretha Sheffield MD   On: 09/06/2020 12:39   CT Cervical Spine Wo Contrast  Result Date: 09/06/2020 CLINICAL DATA:  Trauma. EXAM: CT HEAD WITHOUT CONTRAST CT MAXILLOFACIAL WITHOUT CONTRAST CT CERVICAL SPINE WITHOUT CONTRAST TECHNIQUE: Multidetector CT imaging of the head, cervical spine, and maxillofacial structures were performed using the standard protocol without intravenous contrast. Multiplanar CT image reconstructions of the cervical spine and maxillofacial structures were also generated. COMPARISON:  CTA neck March 26 21.  CT head  03/29/2020. FINDINGS: CT HEAD FINDINGS Brain: No evidence of acute infarction, hemorrhage, hydrocephalus, extra-axial collection or mass lesion/mass effect. Similar mild for age patchy white matter hypoattenuation, compatible with the sequela of chronic microvascular ischemic disease. Similar mild for age atrophy. Vascular: Calcific atherosclerosis. No hyperdense vessel identified. Skull: Frontal scalp contusion/laceration without acute fracture. Other: No mastoid effusions. CT MAXILLOFACIAL FINDINGS Osseous: Right orbital floor fracture is described below. Slightly displaced right nasal bone fracture. Bilateral TMJ joints are located. Left greater than right TMJ degenerative change. Orbits: Acute right orbital floor fracture with adjacent extraconal retrobulbar hematoma. Small amount of fat herniates through the defect. The inferior rectus is separate from the fracture. The fracture extends across the infraorbital foramen. No proptosis. The globes are symmetric and within normal limits. Unremarkable appearance of the optic nerves and extraocular muscles. Sinuses: Right maxillary hemosinus. Scattered mucosal thickening of ethmoid air cells. Soft tissues: Right periorbital and nasal soft tissue contusions. CT CERVICAL SPINE FINDINGS Alignment: Prominent upper cervical kyphosis with anterolisthesis of C2 on C3 and C3 on C4, favored degenerative/chronic given similar alignment on prior CTA neck 2021. Skull base and vertebrae: No acute fracture. No primary bone lesion or focal pathologic process. Soft tissues and spinal canal: No prevertebral  fluid or swelling. No visible canal hematoma. Disc levels: Advanced cervical degenerative change at C2-C3, C3-C4, C4-C5, and C5-C6. Degenerative disc disease is greatest on the right at C5-C6 where there is disc height loss, endplate sclerosis and spurring. Bony fusion across the right C3-C4 facet joint. Multilevel posterior disc osteophyte complexes and facet/uncovertebral  hypertrophy with varying degrees of canal and foraminal stenosis. Upper chest: Biapical pleuroparenchymal scarring with areas of calcification. Other: Calcific atherosclerosis. IMPRESSION: CT head: 1. No evidence of acute intracranial abnormality. Similar mild for age atrophy and chronic microvascular ischemic disease. 2. Frontal scalp contusion/laceration without acute calvarial fracture. CT maxillofacial: 1. Right periorbital soft tissue contusion with acute mildly displaced right orbital floor fracture and adjacent extraconal retrobulbar hematoma and right maxillary hemosinus. Small amount of herniated fat through the defect. The fracture extends across the infraorbital foramen. 2. Slightly displaced right nasal bone fracture with overlying nasal contusion. 3. Left greater than right TMJ degenerative change. CT cervical spine: 1. No evidence of acute fracture. 2. Prominent upper cervical kyphosis with anterolisthesis of C2 on C3 and C3 on C4, favored degenerative/chronic given similar alignment on prior CTA neck 2021. 3. Advanced upper cervical degenerative change with multilevel spinal and foraminal stenosis. An MRI could further evaluate the canal and foramina clinically indicated. Electronically Signed   By: Margaretha Sheffield MD   On: 09/06/2020 12:39   DG Pelvis Portable  Result Date: 09/06/2020 CLINICAL DATA:  Fall. EXAM: PORTABLE PELVIS 1-2 VIEWS COMPARISON:  CT abdomen pelvis dated October 27, 2018. FINDINGS: There is no evidence of pelvic fracture or diastasis. No pelvic bone lesions are seen. Prior lumbar fusion. IMPRESSION: Negative. Electronically Signed   By: Titus Dubin M.D.   On: 09/06/2020 13:35   DG Bone Density  Result Date: 08/24/2020 EXAM: DUAL X-RAY ABSORPTIOMETRY (DXA) FOR BONE MINERAL DENSITY IMPRESSION: Referring Physician:  AMY E Bonneau Your patient completed a bone mineral density test using GE Lunar iDXA system (analysis version: 16). Technologist: Lillian PATIENT: Name: Shashana, Fullington  Patient ID: [id number] Birth Date: 1931/03/30 Height: 61.0 in. Sex: Female Measured: 08/24/2020 Weight: 120.6 lbs. Indications: Advanced Age, Arava, Aricept, Caucasian, Eliquis, Estrogen Deficient, Height Loss (781.91), Hypothyroid, Hysterectomy, Omeprazole, Postmenopausal, Remeron, Rheumatoid Arthritis (714.0), Synthroid, Secondary Osteoporosis Fractures: None Treatments: Vitamin D (E933.5) ASSESSMENT: The BMD measured at Forearm Radius 33% is 0.713 g/cm2 with a T-score of -2.0. This patient is considered osteopenic/low bone mass according to [facility]) criteria. The quality of the exam is good. The lumbar spine was excluded due to surgical hardware. Site Region Measured Date Measured Age YA BMD Significant CHANGE T-score Left Forearm Radius 33% 08/24/2020 86.7 -2.0 0.713 g/cm2 Left Forearm Radius 33% 11/21/2017 84.0 -1.9 0.719 g/cm2 DualFemur Neck Right 08/24/2020 86.7 -1.2 0.875 g/cm2 * DualFemur Neck Right 11/21/2017 84.0 -0.7 0.945 g/cm2 DualFemur Total Mean 08/24/2020 86.7 -0.8 0.907 g/cm2 * DualFemur Total Mean 11/21/2017 84.0 -0.4 0.956 g/cm2 World Health Organization [facility]) criteria for post-menopausal, Caucasian Women: Normal       T-score at or above -1 SD Osteopenia   T-score between -1 and -2.5 SD Osteoporosis T-score at or below -2.5 SD RECOMMENDATION: 1. All patients should optimize calcium and vitamin D intake. 2. Consider FDA-approved medical therapies in postmenopausal women and men aged 95 years and older, based on the following: a. A hip or vertebral (clinical or morphometric) fracture. b. T-score = -2.5 at the femoral neck or spine after appropriate evaluation to exclude secondary causes. c. Low bone mass (T-score between -1.0 and -  2.5 at the femoral neck or spine) and a 10-year probability of a hip fracture = 3% or a 10-year probability of a major osteoporosis-related fracture = 20% based on the [place]-adapted WHO algorithm. d. Clinician judgment and/or patient preferences may  indicate treatment for people with 10-year fracture probabilities above or below these levels. FOLLOW-UP: Patients with diagnosis of osteoporosis or at high risk for fracture should have regular bone mineral density tests.? Patients eligible for Medicare are allowed routine testing every 2 years.? The testing frequency can be increased to one year for patients who have rapidly progressing disease, are receiving or discontinuing medical therapy to restore bone mass, or have additional risk factors. I have reviewed this study and agree with the findings. [facility] FRAX* 10-year Probability of Fracture Based on femoral neck BMD: DualFemur (Right) Major Osteoporotic Fracture: 13.8% Hip Fracture:                4.0% Population:                  [place] (Caucasian) Risk Factors: Rheumatoid Arthritis (714.0), Secondary Osteoporosis *FRAX is a Materials engineer of the [employer] for Metabolic Bone Disease, a [place] (WHO) [facility]. ASSESSMENT: The probability of a major osteoporotic fracture is 13.8% within the next ten years. The probability of a hip fracture is 4.0% within the next ten years. Electronically Signed   By: Rolm Baptise M.D.   On: 08/24/2020 15:25   DG Chest Port 1 View  Result Date: 09/06/2020 CLINICAL DATA:  Fall.  No chest complaints. EXAM: PORTABLE CHEST 1 VIEW COMPARISON:  Chest x-ray dated March 31, 2020. FINDINGS: The heart size and mediastinal contours are within normal limits. Status post mitral and tricuspid valve replacements. Normal pulmonary vascularity. Mild biapical scarring again noted. No focal consolidation, pleural effusion, or pneumothorax. No acute osseous abnormality. IMPRESSION: No active disease. Electronically Signed   By: Titus Dubin M.D.   On: 09/06/2020 13:34   DG Hand Complete Right  Result Date: 09/06/2020 CLINICAL DATA:  87 year old female status post fall with pain and tingling in the right  thumb, 1st metacarpal. EXAM: RIGHT HAND - COMPLETE 3+ VIEW COMPARISON:  None. FINDINGS: Diffuse severe osteoarthritis in the right hand. Associated mild MCP and IP joint subluxations. No superimposed acute fracture or dislocation identified. The 1st metacarpal and phalanges appear intact. Mild to moderate 1st CMC joint degeneration. IMPRESSION: Diffuse severe osteoarthritis with no acute fracture or dislocation identified. Electronically Signed   By: Genevie Ann M.D.   On: 09/06/2020 11:12   CT Maxillofacial Wo Contrast  Result Date: 09/06/2020 CLINICAL DATA:  Trauma. EXAM: CT HEAD WITHOUT CONTRAST CT MAXILLOFACIAL WITHOUT CONTRAST CT CERVICAL SPINE WITHOUT CONTRAST TECHNIQUE: Multidetector CT imaging of the head, cervical spine, and maxillofacial structures were performed using the standard protocol without intravenous contrast. Multiplanar CT image reconstructions of the cervical spine and maxillofacial structures were also generated. COMPARISON:  CTA neck March 26 21.  CT head 03/29/2020. FINDINGS: CT HEAD FINDINGS Brain: No evidence of acute infarction, hemorrhage, hydrocephalus, extra-axial collection or mass lesion/mass effect. Similar mild for age patchy white matter hypoattenuation, compatible with the sequela of chronic microvascular ischemic disease. Similar mild for age atrophy. Vascular: Calcific atherosclerosis. No hyperdense vessel identified. Skull: Frontal scalp contusion/laceration without acute fracture. Other: No mastoid effusions. CT MAXILLOFACIAL FINDINGS Osseous: Right orbital floor fracture is described below. Slightly displaced right nasal bone fracture. Bilateral TMJ joints are located. Left greater than right  TMJ degenerative change. Orbits: Acute right orbital floor fracture with adjacent extraconal retrobulbar hematoma. Small amount of fat herniates through the defect. The inferior rectus is separate from the fracture. The fracture extends across the infraorbital foramen. No proptosis.  The globes are symmetric and within normal limits. Unremarkable appearance of the optic nerves and extraocular muscles. Sinuses: Right maxillary hemosinus. Scattered mucosal thickening of ethmoid air cells. Soft tissues: Right periorbital and nasal soft tissue contusions. CT CERVICAL SPINE FINDINGS Alignment: Prominent upper cervical kyphosis with anterolisthesis of C2 on C3 and C3 on C4, favored degenerative/chronic given similar alignment on prior CTA neck 2021. Skull base and vertebrae: No acute fracture. No primary bone lesion or focal pathologic process. Soft tissues and spinal canal: No prevertebral fluid or swelling. No visible canal hematoma. Disc levels: Advanced cervical degenerative change at C2-C3, C3-C4, C4-C5, and C5-C6. Degenerative disc disease is greatest on the right at C5-C6 where there is disc height loss, endplate sclerosis and spurring. Bony fusion across the right C3-C4 facet joint. Multilevel posterior disc osteophyte complexes and facet/uncovertebral hypertrophy with varying degrees of canal and foraminal stenosis. Upper chest: Biapical pleuroparenchymal scarring with areas of calcification. Other: Calcific atherosclerosis. IMPRESSION: CT head: 1. No evidence of acute intracranial abnormality. Similar mild for age atrophy and chronic microvascular ischemic disease. 2. Frontal scalp contusion/laceration without acute calvarial fracture. CT maxillofacial: 1. Right periorbital soft tissue contusion with acute mildly displaced right orbital floor fracture and adjacent extraconal retrobulbar hematoma and right maxillary hemosinus. Small amount of herniated fat through the defect. The fracture extends across the infraorbital foramen. 2. Slightly displaced right nasal bone fracture with overlying nasal contusion. 3. Left greater than right TMJ degenerative change. CT cervical spine: 1. No evidence of acute fracture. 2. Prominent upper cervical kyphosis with anterolisthesis of C2 on C3 and C3 on C4,  favored degenerative/chronic given similar alignment on prior CTA neck 2021. 3. Advanced upper cervical degenerative change with multilevel spinal and foraminal stenosis. An MRI could further evaluate the canal and foramina clinically indicated. Electronically Signed   By: Margaretha Sheffield MD   On: 09/06/2020 12:39     Subjective: No acute issues or events overnight, ambulating without difficulty denies nausea vomiting diarrhea constipation headache fevers chills chest pain shortness of breath vision changes.   Discharge Exam: Vitals:   09/07/20 2018 09/08/20 0505  BP: (!) 142/82 (!) 158/87  Pulse: 70 82  Resp: 18 16  Temp: 98.4 F (36.9 C) 98 F (36.7 C)  SpO2: 94% 98%   Vitals:   09/07/20 0953 09/07/20 1312 09/07/20 2018 09/08/20 0505  BP: 137/88 (!) 147/81 (!) 142/82 (!) 158/87  Pulse: (!) 107 88 70 82  Resp: 17 17 18 16   Temp: 98.3 F (36.8 C) 98.3 F (36.8 C) 98.4 F (36.9 C) 98 F (36.7 C)  TempSrc: Oral Oral Oral Oral  SpO2: 96% 95% 94% 98%    General: Pt is alert, awake, not in acute distress Cardiovascular: RRR, S1/S2 +, no rubs, no gallops HEENT: Bilateral ocular ecchymosis right greater than left without nystagmus or drift, pupils equal round reactive to light Respiratory: CTA bilaterally, no wheezing, no rhonchi Abdominal: Soft, NT, ND, bowel sounds + Extremities: no edema, no cyanosis    The results of significant diagnostics from this hospitalization (including imaging, microbiology, ancillary and laboratory) are listed below for reference.     Microbiology: Recent Results (from the past 240 hour(s))  Resp Panel by RT-PCR (Flu A&B, Covid) Nasopharyngeal Swab  Status: None   Collection Time: 09/06/20  1:17 PM   Specimen: Nasopharyngeal Swab; Nasopharyngeal(NP) swabs in vial transport medium  Result Value Ref Range Status   SARS Coronavirus 2 by RT PCR NEGATIVE NEGATIVE Final    Comment: (NOTE) SARS-CoV-2 target nucleic acids are NOT DETECTED.  The  SARS-CoV-2 RNA is generally detectable in upper respiratory specimens during the acute phase of infection. The lowest concentration of SARS-CoV-2 viral copies this assay can detect is 138 copies/mL. A negative result does not preclude SARS-Cov-2 infection and should not be used as the sole basis for treatment or other patient management decisions. A negative result may occur with  improper specimen collection/handling, submission of specimen other than nasopharyngeal swab, presence of viral mutation(s) within the areas targeted by this assay, and inadequate number of viral copies(<138 copies/mL). A negative result must be combined with clinical observations, patient history, and epidemiological information. The expected result is Negative.  Fact Sheet for Patients:  EntrepreneurPulse.com.au  Fact Sheet for Healthcare Providers:  IncredibleEmployment.be  This test is no t yet approved or cleared by the [place] FDA and  has been authorized for detection and/or diagnosis of SARS-CoV-2 by FDA under an Emergency Use Authorization (EUA). This EUA will remain  in effect (meaning this test can be used) for the duration of the COVID-19 declaration under Section 564(b)(1) of the Act, 21 U.S.C.section 360bbb-3(b)(1), unless the authorization is terminated  or revoked sooner.       Influenza A by PCR NEGATIVE NEGATIVE Final   Influenza B by PCR NEGATIVE NEGATIVE Final    Comment: (NOTE) The Xpert Xpress SARS-CoV-2/FLU/RSV plus assay is intended as an aid in the diagnosis of influenza from Nasopharyngeal swab specimens and should not be used as a sole basis for treatment. Nasal washings and aspirates are unacceptable for Xpert Xpress SARS-CoV-2/FLU/RSV testing.  Fact Sheet for Patients: EntrepreneurPulse.com.au  Fact Sheet for Healthcare Providers: IncredibleEmployment.be  This test is not yet approved or  cleared by the [place] FDA and has been authorized for detection and/or diagnosis of SARS-CoV-2 by FDA under an Emergency Use Authorization (EUA). This EUA will remain in effect (meaning this test can be used) for the duration of the COVID-19 declaration under Section 564(b)(1) of the Act, 21 U.S.C. section 360bbb-3(b)(1), unless the authorization is terminated or revoked.  Performed at [facility], [address] [place], [place] [address]      Labs: BNP (last 3 results) No results for input(s): BNP in the last 8760 hours. Basic Metabolic Panel: Recent Labs  Lab 09/06/20 1127 09/07/20 0037  NA 135 138  K 4.0 4.3  CL 102 102  CO2 25 26  GLUCOSE 114* 96  BUN 19 15  CREATININE 0.75 0.85  CALCIUM 9.2 9.1   Liver Function Tests: No results for input(s): AST, ALT, ALKPHOS, BILITOT, PROT, ALBUMIN in the last 168 hours. No results for input(s): LIPASE, AMYLASE in the last 168 hours. No results for input(s): AMMONIA in the last 168 hours. CBC: Recent Labs  Lab 09/06/20 1127 09/07/20 0037  WBC 10.1 8.5  HGB 10.3* 10.1*  HCT 30.6* 30.4*  MCV 103.0* 104.5*  PLT 172 188   Cardiac Enzymes: No results for input(s): CKTOTAL, CKMB, CKMBINDEX, TROPONINI in the last 168 hours. BNP: Invalid input(s): POCBNP CBG: No results for input(s): GLUCAP in the last 168 hours. D-Dimer No results for input(s): DDIMER in the last 72 hours. Hgb A1c No results for input(s): HGBA1C in the last 72 hours. Lipid  Profile No results for input(s): CHOL, HDL, LDLCALC, TRIG, CHOLHDL, LDLDIRECT in the last 72 hours. Thyroid function studies No results for input(s): TSH, T4TOTAL, T3FREE, THYROIDAB in the last 72 hours.  Invalid input(s): FREET3 Anemia work up No results for input(s): VITAMINB12, FOLATE, FERRITIN, TIBC, IRON, RETICCTPCT in the last 72 hours. Urinalysis    Component Value Date/Time   COLORURINE COLORLESS (A) 09/06/2020 1042   APPEARANCEUR CLEAR  09/06/2020 1042   LABSPEC 1.006 09/06/2020 1042   PHURINE 6.0 09/06/2020 1042   GLUCOSEU NEGATIVE 09/06/2020 1042   HGBUR NEGATIVE 09/06/2020 1042   BILIRUBINUR NEGATIVE 09/06/2020 1042   KETONESUR NEGATIVE 09/06/2020 1042   PROTEINUR NEGATIVE 09/06/2020 1042   NITRITE NEGATIVE 09/06/2020 1042   LEUKOCYTESUR NEGATIVE 09/06/2020 1042   Sepsis Labs Invalid input(s): PROCALCITONIN,  WBC,  LACTICIDVEN Microbiology Recent Results (from the past 240 hour(s))  Resp Panel by RT-PCR (Flu A&B, Covid) Nasopharyngeal Swab     Status: None   Collection Time: 09/06/20  1:17 PM   Specimen: Nasopharyngeal Swab; Nasopharyngeal(NP) swabs in vial transport medium  Result Value Ref Range Status   SARS Coronavirus 2 by RT PCR NEGATIVE NEGATIVE Final    Comment: (NOTE) SARS-CoV-2 target nucleic acids are NOT DETECTED.  The SARS-CoV-2 RNA is generally detectable in upper respiratory specimens during the acute phase of infection. The lowest concentration of SARS-CoV-2 viral copies this assay can detect is 138 copies/mL. A negative result does not preclude SARS-Cov-2 infection and should not be used as the sole basis for treatment or other patient management decisions. A negative result may occur with  improper specimen collection/handling, submission of specimen other than nasopharyngeal swab, presence of viral mutation(s) within the areas targeted by this assay, and inadequate number of viral copies(<138 copies/mL). A negative result must be combined with clinical observations, patient history, and epidemiological information. The expected result is Negative.  Fact Sheet for Patients:  EntrepreneurPulse.com.au  Fact Sheet for Healthcare Providers:  IncredibleEmployment.be  This test is no t yet approved or cleared by the [place] FDA and  has been authorized for detection and/or diagnosis of SARS-CoV-2 by FDA under an Emergency Use Authorization (EUA).  This EUA will remain  in effect (meaning this test can be used) for the duration of the COVID-19 declaration under Section 564(b)(1) of the Act, 21 U.S.C.section 360bbb-3(b)(1), unless the authorization is terminated  or revoked sooner.       Influenza A by PCR NEGATIVE NEGATIVE Final   Influenza B by PCR NEGATIVE NEGATIVE Final    Comment: (NOTE) The Xpert Xpress SARS-CoV-2/FLU/RSV plus assay is intended as an aid in the diagnosis of influenza from Nasopharyngeal swab specimens and should not be used as a sole basis for treatment. Nasal washings and aspirates are unacceptable for Xpert Xpress SARS-CoV-2/FLU/RSV testing.  Fact Sheet for Patients: EntrepreneurPulse.com.au  Fact Sheet for Healthcare Providers: IncredibleEmployment.be  This test is not yet approved or cleared by the [place] FDA and has been authorized for detection and/or diagnosis of SARS-CoV-2 by FDA under an Emergency Use Authorization (EUA). This EUA will remain in effect (meaning this test can be used) for the duration of the COVID-19 declaration under Section 564(b)(1) of the Act, 21 U.S.C. section 360bbb-3(b)(1), unless the authorization is terminated or revoked.  Performed at [facility], [address] [place], [place] [address]      Time coordinating discharge: Over 30 minutes  SIGNED:   Little Ishikawa, DO [facility] 09/08/2020, 12:43 PM Pager   If  7PM-7AM, please contact night-coverage [email or web address]

## 2020-09-08 NOTE — Progress Notes (Signed)
[name] to be D/C'd  per MD order.  Discussed with the patient and all questions fully answered.  VSS, Skin clean, dry and intact without evidence of skin break down, no evidence of skin tears noted.  IV catheter discontinued intact. Site without signs and symptoms of complications. Dressing and pressure applied.  An After Visit Summary was printed and given to the patient and daughter. Patient is waiting for a 3 in 1, but unable to wait for the delivery of equipment. Daughter has agree to pick the equipment up once its delivered.  D/c education completed with patient/family including follow up instructions, medication list, d/c activities limitations if indicated, with other d/c instructions as indicated by MD - patient able to verbalize understanding, all questions fully answered.   Patient instructed to return to ED, call 911, or call MD for any changes in condition.   Patient to be escorted via [place], and D/C home via private auto.

## 2020-09-08 NOTE — Evaluation (Signed)
Occupational Therapy Evaluation Patient Details Name: John Vasconcelos MRN: [id number] DOB: 24-Feb-1932 Today's Date: 09/08/2020    History of Present Illness 87 yo female presents to [facility] on 6/14 with dizziness, fall. Head, neck, facial bone CT significant for orbital fracture with retrobulbar hematoma, nasal bone fx, no evidence of acute cervical injury. PMH includes anxiety, OA, colon cancer, GERD, HTN, RA, seizures, afib, TIAs, ILD, PLIF 2017, MCI, tricuspid valve repair.   Clinical Impression   Pt admitted to the ED following fall, resulting in concerns listed above. PTA, pt reported that she is independent with all ADL's as well as IADL's. Pt's ILF provides some meals each day, however pt is able to still cook for herself when needed. Pt completed all ADL's at mod I/baseline level, using RW, requiring no physical assistance. At this time, pt does not require any OT services and will be discharged from OT. Thanks for the referral.    Follow Up Recommendations  No OT follow up    Equipment Recommendations  3 in 1 bedside commode    Recommendations for Other Services       Precautions / Restrictions Precautions Precautions: Fall;Other (comment) Precaution Comments: sinus precautions - PT reviewed no nose blowing, no leaning head low (reaching down for instance), no straw use Restrictions Weight Bearing Restrictions: No      Mobility Bed Mobility Overal bed mobility: Needs Assistance             General bed mobility comments: up in chair upon PT arrival to room    Transfers Overall transfer level: Modified independent Equipment used: Rolling walker (2 wheeled) Transfers: Sit to/from Stand Sit to Stand: Modified independent (Device/Increase time)         General transfer comment: Pt with proper hand placement when rising/sitting    Balance Overall balance assessment: History of Falls;Needs assistance Sitting-balance support: No upper extremity supported;Feet  supported Sitting balance-Leahy Scale: Good     Standing balance support: During functional activity;No upper extremity supported Standing balance-Leahy Scale: Fair Standing balance comment: x20 ft ambulation without AD, cannot accept challenge without RW                           ADL either performed or assessed with clinical judgement   ADL Overall ADL's : Modified independent;At baseline                                       General ADL Comments: Pt at baseline for all ADL's. Pt completed dressing and toilet transfer and functional mobility with no difficulty this session. Pt reports that she feels like she is moving around normally, and daughter agrees.     Vision Baseline Vision/History: Wears glasses Wears Glasses: At all times Patient Visual Report: No change from baseline Vision Assessment?: Vision impaired- to be further tested in functional context Additional Comments: Pt reports having diplopia in upper right peripheral quadrant, otherwise her vision is normal. Pt able to read clock and signs on the wall with no difficulty.     Perception Perception Perception Tested?: No   Praxis Praxis Praxis tested?: Not tested    Pertinent Vitals/Pain Pain Assessment: No/denies pain Faces Pain Scale: Hurts a little bit Pain Location: face Pain Descriptors / Indicators: Discomfort;Sore Pain Intervention(s): Limited activity within patient's tolerance;Monitored during session;Repositioned     Hand Dominance Right   Extremity/Trunk Assessment Upper  Extremity Assessment Upper Extremity Assessment: Overall WFL for tasks assessed   Lower Extremity Assessment Lower Extremity Assessment: Defer to PT evaluation   Cervical / Trunk Assessment Cervical / Trunk Assessment: Kyphotic   Communication Communication Communication: HOH   Cognition Arousal/Alertness: Awake/alert Behavior During Therapy: WFL for tasks assessed/performed Overall Cognitive  Status: History of cognitive impairments - at baseline                                 General Comments: history of MCI, pt endorses STM deficits.   General Comments  VSS on Ra    Exercises     Shoulder Instructions      Home Living Family/patient expects to be discharged to:: Private residence Living Arrangements: Alone Available Help at Discharge: Family;Available PRN/intermittently Type of Home: Independent living facility Home Access: Level entry     Home Layout: One level     Bathroom Shower/Tub: Occupational psychologist: Standard Bathroom Accessibility: Yes How Accessible: Accessible via walker Home Equipment: Grab bars - tub/shower;Grab bars - toilet;Shower seat;Walker - 2 wheels;Walker - 4 wheels          Prior Functioning/Environment Level of Independence: Independent with assistive device(s)        Comments: pt reports using rollator vs RW for ambulation, occasionally walks in apt without RW. Pt reports history of falls over the last few years, but none were "significant". Pt independent with dressing, bathing, breakfast/lunch meal prep; daughter helps pt with med management as pt has month-long pill box.        OT Problem List: Decreased strength;Decreased activity tolerance;Impaired balance (sitting and/or standing);Decreased coordination;Decreased safety awareness;Decreased knowledge of use of DME or AE      OT Treatment/Interventions:      OT Goals(Current goals can be found in the care plan section) Acute Rehab OT Goals Patient Stated Goal: go home today OT Goal Formulation: With patient/family Time For Goal Achievement: 09/08/20 Potential to Achieve Goals: Good  OT Frequency:     Barriers to D/C:            Co-evaluation              AM-PAC OT "6 Clicks" Daily Activity     Outcome Measure Help from another person eating meals?: None Help from another person taking care of personal grooming?: None Help from  another person toileting, which includes using toliet, bedpan, or urinal?: None Help from another person bathing (including washing, rinsing, drying)?: None Help from another person to put on and taking off regular upper body clothing?: None Help from another person to put on and taking off regular lower body clothing?: None 6 Click Score: 24   End of Session Equipment Utilized During Treatment: Rolling walker Nurse Communication: Mobility status  Activity Tolerance: Patient tolerated treatment well Patient left: in chair  OT Visit Diagnosis: Unsteadiness on feet (R26.81);Repeated falls (R29.6);Muscle weakness (generalized) (M62.81)                Time: 1050-1108 OT Time Calculation (min): 18 min Charges:  OT General Charges $OT Visit: 1 Visit OT Evaluation $OT Eval Low Complexity: [place]., OTR/L Acute Rehabilitation  Tifany Hirsch Elane Yolanda Bonine 09/08/2020, 1:49 PM

## 2020-09-08 NOTE — Evaluation (Signed)
Physical Therapy Evaluation Patient Details Name: Kathy Velez MRN: [id number] DOB: 05/27/1931 Today's Date: 09/08/2020   History of Present Illness  87 yo female presents to [facility] on 6/14 with dizziness, fall. Head, neck, facial bone CT significant for orbital fracture with retrobulbar hematoma, nasal bone fx, no evidence of acute cervical injury. PMH includes anxiety, OA, colon cancer, GERD, HTN, RA, seizures, afib, TIAs, ILD, PLIF 2017, MCI, tricuspid valve repair.   Clinical Impression  Pt presents with functional weakness but good MMT strength assessment, mildly impaired standing balance with history of falls, good activity tolerance for age. Pt to benefit from acute PT to address deficits. Pt ambulated hallway distance with RW and supervision level of assist for safety, PT cuing form and safety with RW use. PT suspects pt is close to baseline, per pt she ambulates to dining room daily with RW vs rollator, participates in exercise classes at facility, and is mostly independent with ADLs and mobility. PT recommending increased support from daughter at d/c, especially for the next few days, and PT encouraged pt to take her time at each positional change (supine>sit, sit>stand) and to monitor for s/s of dizziness, shortness of breath given afib, and fatigue. HR 130s-140s during gait appears in afib rhythm, MD notified and states it is okay as afib is pt baseline. PT to progress mobility as tolerated, and will continue to follow acutely.      Follow Up Recommendations Supervision for mobility/OOB;No PT follow up    Equipment Recommendations  None recommended by PT    Recommendations for Other Services       Precautions / Restrictions Precautions Precautions: Fall;Other (comment) Precaution Comments: sinus precautions - PT reviewed no nose blowing, no leaning head low (reaching down for instance), no straw use Restrictions Weight Bearing Restrictions: No      Mobility  Bed Mobility                General bed mobility comments: up in chair upon PT arrival to room    Transfers Overall transfer level: Needs assistance Equipment used: Rolling walker (2 wheeled) Transfers: Sit to/from Stand Sit to Stand: Supervision         General transfer comment: for safety, pt with proper hand placement when rising/sitting  Ambulation/Gait Ambulation/Gait assistance: Supervision Gait Distance (Feet): 250 Feet (x2 - seated rest break) Assistive device: Rolling walker (2 wheeled) Gait Pattern/deviations: Step-through pattern;Decreased stride length;Trunk flexed Gait velocity: slightly decr   General Gait Details: supervision for safety, verbal cuing for upright posture, placement in RW x2.  Stairs            Wheelchair Mobility    Modified Rankin (Stroke Patients Only)       Balance Overall balance assessment: History of Falls;Needs assistance Sitting-balance support: No upper extremity supported;Feet supported Sitting balance-Leahy Scale: Good     Standing balance support: During functional activity;No upper extremity supported Standing balance-Leahy Scale: Fair Standing balance comment: x20 ft ambulation without AD, cannot accept challenge without RW                             Pertinent Vitals/Pain Pain Assessment: Faces Faces Pain Scale: Hurts a little bit Pain Location: face Pain Descriptors / Indicators: Discomfort;Sore Pain Intervention(s): Limited activity within patient's tolerance;Monitored during session;Repositioned    Home Living Family/patient expects to be discharged to:: Private residence Living Arrangements: Alone Available Help at Discharge: Family;Available PRN/intermittently Type of Home: Independent living facility Home  Access: Level entry     Home Layout: One level Home Equipment: Grab bars - tub/shower;Grab bars - toilet;Shower seat;Walker - 2 wheels;Walker - 4 wheels      Prior Function Level of Independence:  Independent with assistive device(s)         Comments: pt reports using rollator vs RW for ambulation, occasionally walks in apt without RW. Pt reports history of falls over the last few years, but none were "significant". Pt independent with dressing, bathing, breakfast/lunch meal prep; daughter helps pt with med management as pt has month-long pill box.     Hand Dominance   Dominant Hand: Right    Extremity/Trunk Assessment   Upper Extremity Assessment Upper Extremity Assessment: Defer to OT evaluation    Lower Extremity Assessment Lower Extremity Assessment: Overall WFL for tasks assessed (at least 4/5 all LE MMT motions)    Cervical / Trunk Assessment Cervical / Trunk Assessment: Kyphotic  Communication   Communication: HOH (has hearing aides)  Cognition Arousal/Alertness: Awake/alert Behavior During Therapy: WFL for tasks assessed/performed Overall Cognitive Status: History of cognitive impairments - at baseline                                 General Comments: history of MCI, pt endorses STM deficits.      General Comments General comments (skin integrity, edema, etc.): HR 130s-140s during gait appears in afib rhythm, MD notified and states it is okay as afib is pt baseline    Exercises     Assessment/Plan    PT Assessment Patient needs continued PT services  PT Problem List Decreased mobility;Decreased safety awareness;Decreased activity tolerance;Decreased balance;Decreased knowledge of use of DME;Decreased strength       PT Treatment Interventions DME instruction;Therapeutic activities;Therapeutic exercise;Gait training;Patient/family education;Balance training;Functional mobility training    PT Goals (Current goals can be found in the Care Plan section)  Acute Rehab PT Goals Patient Stated Goal: go home today PT Goal Formulation: With patient Time For Goal Achievement: 09/15/20 Potential to Achieve Goals: Good    Frequency Min  3X/week   Barriers to discharge        Co-evaluation               AM-PAC PT "6 Clicks" Mobility  Outcome Measure Help needed turning from your back to your side while in a flat bed without using bedrails?: None Help needed moving from lying on your back to sitting on the side of a flat bed without using bedrails?: None Help needed moving to and from a bed to a chair (including a wheelchair)?: None Help needed standing up from a chair using your arms (e.g., wheelchair or bedside chair)?: None Help needed to walk in hospital room?: A Little Help needed climbing 3-5 steps with a railing? : A Little 6 Click Score: 22    End of Session   Activity Tolerance: Patient tolerated treatment well Patient left: in chair;with call bell/phone within reach;with chair alarm set Nurse Communication: Mobility status PT Visit Diagnosis: Other abnormalities of gait and mobility (R26.89);History of falling (Z91.81)    Time: 1700-1749 PT Time Calculation (min) (ACUTE ONLY): 30 min   Charges:   PT Evaluation $PT Eval Low Complexity: 1 Low PT Treatments $Gait Training: 8-22 mins       Stacie Glaze, PT DPT Acute Rehabilitation Services Pager [phone]  Office [phone]   [employer] 09/08/2020, 10:17 AM

## 2020-09-09 ENCOUNTER — Telehealth: Payer: Self-pay | Admitting: *Deleted

## 2020-09-09 NOTE — Telephone Encounter (Signed)
Transition Care Management Follow-up Telephone Call Date of discharge and from where: 09/08/2020 [facility] How have you been since you were released from the hospital? Getting Better, slow process Any questions or concerns? No  Items Reviewed: Did the pt receive and understand the discharge instructions provided? Yes  Medications obtained and verified? Yes  Other? No  Any new allergies since your discharge? No  Dietary orders reviewed? Yes Do you have support at home? Yes   Home Care and Equipment/Supplies: Were home health services ordered? no If so, what is the name of the agency? No Home Health, but daughter has hired someone a couple of days a week to come in and help patient.    Has the agency set up a time to come to the patient's home? not applicable Were any new equipment or medical supplies ordered?  No What is the name of the medical supply agency? N/A  Were you able to get the supplies/equipment? not applicable Do you have any questions related to the use of the equipment or supplies? No  Functional Questionnaire: (I = Independent and D = Dependent) ADLs: I with assistance, has hired help and uses Scientist, research (medical)- I with assistance  Meal Prep- D  Eating- I  Maintaining continence- I  Transferring/Ambulation- I with assistance has hired help and Theme park manager Meds- I with assistance  Follow up appointments reviewed:  PCP Hospital f/u appt confirmed? Yes  Scheduled to see Wert on 09/12/20 @ 3:30. [facility] f/u appt confirmed? No   Are transportation arrangements needed? No  If their condition worsens, is the pt aware to call PCP or go to the Emergency Dept.? Yes Was the patient provided with contact information for the PCP's office or ED? Yes Was to pt encouraged to call back with questions or concerns? Yes

## 2020-09-12 ENCOUNTER — Other Ambulatory Visit: Payer: Self-pay

## 2020-09-12 ENCOUNTER — Encounter: Payer: Self-pay | Admitting: Adult Health

## 2020-09-12 ENCOUNTER — Ambulatory Visit: Payer: Medicare Other | Admitting: Adult Health

## 2020-09-12 VITALS — BP 126/92 | HR 63 | Temp 97.7°F | Ht 62.0 in | Wt 123.2 lb

## 2020-09-12 DIAGNOSIS — S022XXA Fracture of nasal bones, initial encounter for closed fracture: Secondary | ICD-10-CM

## 2020-09-12 DIAGNOSIS — S0285XA Fracture of orbit, unspecified, initial encounter for closed fracture: Secondary | ICD-10-CM | POA: Diagnosis not present

## 2020-09-12 DIAGNOSIS — H05231 Hemorrhage of right orbit: Secondary | ICD-10-CM

## 2020-09-12 DIAGNOSIS — R2681 Unsteadiness on feet: Secondary | ICD-10-CM

## 2020-09-12 DIAGNOSIS — I4821 Permanent atrial fibrillation: Secondary | ICD-10-CM | POA: Diagnosis not present

## 2020-09-12 NOTE — Progress Notes (Signed)
Location:   [facility] operational officer of Service:   Clinic  Provider:  Cindi Carbon, [place] [phone]   Code Status: DNR  Goals of Care:  Advanced Directives 09/12/2020  Does Patient Have a Medical Advance Directive? No  Type of Advance Directive -  Does patient want to make changes to medical advance directive? -  Copy of [place] in Chart? -  Would patient like information on creating a medical advance directive? No - Patient declined  Pre-existing out of facility DNR order (yellow form or pink MOST form) -     Chief Complaint  Patient presents with   Transitions Of Care    Patient returns to the clinic with her daughter Di Kindle.     HPI: Patient is a 87 y.o. female seen today for hospital follow-up s/p admission from 09/08/20-09/10/20 due a fall with subsequent mildly displaced right orbital floor fracture and adjacent extraconal retrobulbar hematoma and right maxillary hemosinus. She also has a slightly displaced right nasal bone fx.  She was getting up out of bed and reports she fell forward. She does not feel she passed out or felt dizzy. She had taken a dose of 100 mg of neurontin for leg cramps the evening before. Eliquis was reversed and then later resumed at discharge for afib. She reports she is not having any facial headaches or pain. She has some mild tenderness only. No epistaxis, difficulty breathing through the nose, facial tightness, etc. She does not feel dizzy and does not feel light headed with position changes. She denies any palpitations or sob.   She reports her leg cramps and diarrhea are better after stopping aricept.   Past Medical History:  Diagnosis Date   Anemia    Anxiety    Arthritis    Back   Atrial fibrillation ([place])    Cancer ([place]) 2006   Colon.  Basal Cell Skin cancer- right arm   Colon cancer ([facility])    Constipation due to pain medication therapy    after heart surgery    Dysrhythmia    PAF   GERD (gastroesophageal reflux disease)    Heart murmur    Hypertension    Hypothyroidism    RA (rheumatoid arthritis) ([place])    Restless leg    Seizures ([place])    after Heart Surgey   Stroke ([place])    TIA- found by neurologist after     Past Surgical History:  Procedure Laterality Date   ABDOMINAL HYSTERECTOMY  1970   Partial    COLON RESECTION  2006   cancer   COLONOSCOPY     EYE SURGERY Bilateral    Cataract   MAXIMUM ACCESS (MAS)POSTERIOR LUMBAR INTERBODY FUSION (PLIF) 2 LEVEL N/A 04/12/2015   Procedure: Lumbar Three-Five Decompression, Pedicle Screw Fixation, and Posteriolateral Arthrodesis;  Surgeon: Erline Levine, MD;  Location: [facility];  Service: Neurosurgery;  Laterality: N/A;  L3-4 L4-5 Maximum access posterior lumbar fusion, possible interbodies and resection of synovial cyst at L4-5   MITRAL VALVE REPAIR  01/20/13   Gore-tex cords to P1, P2, and P3. Magic suture to posterior medial commisure, #30 Physio 1 ring. Done in [place]   TONSILLECTOMY     about [place]  01/20/13   #28 TriAd ring done in [place]    Allergies  Allergen Reactions   Codeine Other (See Comments)    "just don't take it well"   Gabapentin  dizziness   Molds & Smuts    Pollen Extract Swelling   Bee Venom Swelling and Rash    Outpatient Encounter Medications as of 09/12/2020  Medication Sig   acetaminophen (TYLENOL) 325 MG tablet Take 2 tablets (650 mg total) by mouth every 6 (six) hours as needed for mild pain or fever.   apixaban (ELIQUIS) 2.5 MG TABS tablet Take 1 tablet (2.5 mg total) by mouth 2 (two) times daily.   atorvastatin (LIPITOR) 20 MG tablet TAKE 1 TABLET DAILY   Cholecalciferol (VITAMIN D) 50 MCG (2000 UT) CAPS Take 1 capsule by mouth daily.   digoxin (LANOXIN) 0.125 MG tablet TAKE 1 TABLET DAILY   ENBREL SURECLICK 50 MG/ML injection    leflunomide (ARAVA) 20 MG tablet Take 20 mg by mouth daily.   metoprolol succinate  (TOPROL-XL) 50 MG 24 hr tablet TAKE 1 TABLET DAILY   mirtazapine (REMERON) 15 MG tablet Take 1 tablet (15 mg total) by mouth at bedtime.   Multiple Vitamins-Minerals (PRESERVISION AREDS 2) CAPS Take 2 Doses by mouth daily at 6 (six) AM. For eye health   omeprazole (PRILOSEC) 40 MG capsule Take 1 capsule (40 mg total) by mouth daily.   oxymetazoline (AFRIN) 0.05 % nasal spray Place 2 sprays into both nostrils 2 (two) times daily as needed for congestion (Bleeding).   Psyllium (METAMUCIL MULTIHEALTH FIBER) 63 % POWD Take 1 Scoop by mouth in the morning.   sodium chloride (OCEAN) 0.65 % SOLN nasal spray Place 2 sprays into both nostrils every 4 (four) hours as needed for congestion.   SYNTHROID 75 MCG tablet TAKE 1 TABLET DAILY BEFORE BREAKFAST FOR HYPOTHYROIDISM   valsartan (DIOVAN) 80 MG tablet TAKE 1 TABLET DAILY   vitamin B-12 (CYANOCOBALAMIN) 1000 MCG tablet Take 1,000 mcg by mouth daily.    [DISCONTINUED] gabapentin (NEURONTIN) 100 MG capsule Take 1 capsule (100 mg total) by mouth at bedtime. Try one capsule at bedtime, if no improvement after 1 week can try two capsules at bedtime. (Patient not taking: Reported on 09/12/2020)   No facility-administered encounter medications on file as of 09/12/2020.    Review of Systems:  Review of Systems  Constitutional:  Negative for activity change, appetite change, chills, diaphoresis, fatigue, fever and unexpected weight change.  HENT:  Positive for facial swelling. Negative for congestion, dental problem, drooling, ear discharge, ear pain, hearing loss, nosebleeds, postnasal drip, rhinorrhea, sinus pressure, sinus pain, sneezing, sore throat, tinnitus, trouble swallowing and voice change.   Eyes:  Negative for photophobia, pain, discharge, redness, itching and visual disturbance.  Respiratory:  Negative for cough, shortness of breath and wheezing.   Cardiovascular:  Negative for chest pain, palpitations and leg swelling.  Gastrointestinal:  Negative  for abdominal distention, abdominal pain, constipation and diarrhea.  Genitourinary:  Negative for difficulty urinating and dysuria.  Musculoskeletal:  Positive for gait problem. Negative for arthralgias, back pain, joint swelling and myalgias.  Neurological:  Negative for dizziness, tremors, seizures, syncope, facial asymmetry, speech difficulty, weakness, light-headedness, numbness and headaches.  Psychiatric/Behavioral:  Negative for agitation, behavioral problems and confusion (memory loss).    Health Maintenance  Topic Date Due   COVID-19 Vaccine (4 - Booster for Moderna series) 05/11/2020   INFLUENZA VACCINE  10/24/2020   TETANUS/TDAP  04/16/2027   DEXA SCAN  Completed   PNA vac Low Risk Adult  Completed   Zoster Vaccines- Shingrix  Completed   HPV VACCINES  Aged Out    Physical Exam: Vitals:   09/12/20 1533  BP: (!) 126/92  Pulse: 63  Temp: 97.7 F (36.5 C)  SpO2: 98%  Weight: 123 lb 3.2 oz (55.9 kg)  Height: 5\' 2"  (1.575 m)   Body mass index is 22.53 kg/m. Physical Exam Vitals and nursing note reviewed.  Constitutional:      General: She is not in acute distress.    Appearance: She is not diaphoretic.  HENT:     Head: Contusion present.      Mouth/Throat:     Mouth: Mucous membranes are moist.     Pharynx: Oropharynx is clear. No oropharyngeal exudate or posterior oropharyngeal erythema.  Eyes:     General:        Right eye: No discharge.        Left eye: No discharge.     Extraocular Movements: Extraocular movements intact.     Conjunctiva/sclera: Conjunctivae normal.     Pupils: Pupils are equal, round, and reactive to light.  Neck:     Vascular: No JVD.  Cardiovascular:     Rate and Rhythm: Normal rate. Rhythm irregular.     Heart sounds: No murmur heard. Pulmonary:     Effort: Pulmonary effort is normal. No respiratory distress.     Breath sounds: Normal breath sounds. No wheezing.  Musculoskeletal:        General: Tenderness (right maxillary area)  present. No swelling, deformity or signs of injury.     Cervical back: No rigidity or tenderness.  Lymphadenopathy:     Cervical: No cervical adenopathy.  Skin:    General: Skin is warm and dry.  Neurological:     General: No focal deficit present.     Mental Status: She is alert and oriented to person, place, and time. Mental status is at baseline.     Cranial Nerves: No cranial nerve deficit.     Motor: No weakness.     Coordination: Coordination normal.     Gait: Gait abnormal (chronic uses a walker).  Psychiatric:        Mood and Affect: Mood normal.    Labs reviewed: Basic Metabolic Panel: Recent Labs    03/29/20 1537 04/05/20 0000 06/09/20 0000 09/06/20 1127 09/07/20 0037  NA 137   < > 134* 135 138  K 4.4   < > 4.4 4.0 4.3  CL 104   < > 101 102 102  CO2  --    < > 22 25 26   GLUCOSE 103*  --   --  114* 96  BUN 23   < > 22* 19 15  CREATININE 0.90   < > 0.8 0.75 0.85  CALCIUM  --    < > 9.3 9.2 9.1   < > = values in this interval not displayed.   Liver Function Tests: Recent Labs    03/29/20 0000 03/29/20 1525 04/05/20 0000  AST  --  24 23  ALT  --  23 20  ALKPHOS  --  72 91  BILITOT  --  0.8  --   PROT  --  6.5  --   ALBUMIN 4.5 3.9  --    No results for input(s): LIPASE, AMYLASE in the last 8760 hours. No results for input(s): AMMONIA in the last 8760 hours. CBC: Recent Labs    03/29/20 1525 03/29/20 1537 08/02/20 0000 09/06/20 1127 09/07/20 0037  WBC 6.9   < > 4.6 10.1 8.5  NEUTROABS 3.1  --   --   --   --   HGB  10.9*   < > 10.7* 10.3* 10.1*  HCT 32.6*   < > 32* 30.6* 30.4*  MCV 105.8*  --   --  103.0* 104.5*  PLT 169   < > 189 172 188   < > = values in this interval not displayed.   Lipid Panel: No results for input(s): CHOL, HDL, LDLCALC, TRIG, CHOLHDL, LDLDIRECT in the last 8760 hours. Lab Results  Component Value Date   HGBA1C 5.5 01/12/2013    Procedures since last visit: CT Head Wo Contrast  Result Date: 09/06/2020 CLINICAL  DATA:  Trauma. EXAM: CT HEAD WITHOUT CONTRAST CT MAXILLOFACIAL WITHOUT CONTRAST CT CERVICAL SPINE WITHOUT CONTRAST TECHNIQUE: Multidetector CT imaging of the head, cervical spine, and maxillofacial structures were performed using the standard protocol without intravenous contrast. Multiplanar CT image reconstructions of the cervical spine and maxillofacial structures were also generated. COMPARISON:  CTA neck March 26 21.  CT head 03/29/2020. FINDINGS: CT HEAD FINDINGS Brain: No evidence of acute infarction, hemorrhage, hydrocephalus, extra-axial collection or mass lesion/mass effect. Similar mild for age patchy white matter hypoattenuation, compatible with the sequela of chronic microvascular ischemic disease. Similar mild for age atrophy. Vascular: Calcific atherosclerosis. No hyperdense vessel identified. Skull: Frontal scalp contusion/laceration without acute fracture. Other: No mastoid effusions. CT MAXILLOFACIAL FINDINGS Osseous: Right orbital floor fracture is described below. Slightly displaced right nasal bone fracture. Bilateral TMJ joints are located. Left greater than right TMJ degenerative change. Orbits: Acute right orbital floor fracture with adjacent extraconal retrobulbar hematoma. Small amount of fat herniates through the defect. The inferior rectus is separate from the fracture. The fracture extends across the infraorbital foramen. No proptosis. The globes are symmetric and within normal limits. Unremarkable appearance of the optic nerves and extraocular muscles. Sinuses: Right maxillary hemosinus. Scattered mucosal thickening of ethmoid air cells. Soft tissues: Right periorbital and nasal soft tissue contusions. CT CERVICAL SPINE FINDINGS Alignment: Prominent upper cervical kyphosis with anterolisthesis of C2 on C3 and C3 on C4, favored degenerative/chronic given similar alignment on prior CTA neck 2021. Skull base and vertebrae: No acute fracture. No primary bone lesion or focal pathologic  process. Soft tissues and spinal canal: No prevertebral fluid or swelling. No visible canal hematoma. Disc levels: Advanced cervical degenerative change at C2-C3, C3-C4, C4-C5, and C5-C6. Degenerative disc disease is greatest on the right at C5-C6 where there is disc height loss, endplate sclerosis and spurring. Bony fusion across the right C3-C4 facet joint. Multilevel posterior disc osteophyte complexes and facet/uncovertebral hypertrophy with varying degrees of canal and foraminal stenosis. Upper chest: Biapical pleuroparenchymal scarring with areas of calcification. Other: Calcific atherosclerosis. IMPRESSION: CT head: 1. No evidence of acute intracranial abnormality. Similar mild for age atrophy and chronic microvascular ischemic disease. 2. Frontal scalp contusion/laceration without acute calvarial fracture. CT maxillofacial: 1. Right periorbital soft tissue contusion with acute mildly displaced right orbital floor fracture and adjacent extraconal retrobulbar hematoma and right maxillary hemosinus. Small amount of herniated fat through the defect. The fracture extends across the infraorbital foramen. 2. Slightly displaced right nasal bone fracture with overlying nasal contusion. 3. Left greater than right TMJ degenerative change. CT cervical spine: 1. No evidence of acute fracture. 2. Prominent upper cervical kyphosis with anterolisthesis of C2 on C3 and C3 on C4, favored degenerative/chronic given similar alignment on prior CTA neck 2021. 3. Advanced upper cervical degenerative change with multilevel spinal and foraminal stenosis. An MRI could further evaluate the canal and foramina clinically indicated. Electronically Signed   By: Margaretha Sheffield MD  On: 09/06/2020 12:39   CT Cervical Spine Wo Contrast  Result Date: 09/06/2020 CLINICAL DATA:  Trauma. EXAM: CT HEAD WITHOUT CONTRAST CT MAXILLOFACIAL WITHOUT CONTRAST CT CERVICAL SPINE WITHOUT CONTRAST TECHNIQUE: Multidetector CT imaging of the head,  cervical spine, and maxillofacial structures were performed using the standard protocol without intravenous contrast. Multiplanar CT image reconstructions of the cervical spine and maxillofacial structures were also generated. COMPARISON:  CTA neck March 26 21.  CT head 03/29/2020. FINDINGS: CT HEAD FINDINGS Brain: No evidence of acute infarction, hemorrhage, hydrocephalus, extra-axial collection or mass lesion/mass effect. Similar mild for age patchy white matter hypoattenuation, compatible with the sequela of chronic microvascular ischemic disease. Similar mild for age atrophy. Vascular: Calcific atherosclerosis. No hyperdense vessel identified. Skull: Frontal scalp contusion/laceration without acute fracture. Other: No mastoid effusions. CT MAXILLOFACIAL FINDINGS Osseous: Right orbital floor fracture is described below. Slightly displaced right nasal bone fracture. Bilateral TMJ joints are located. Left greater than right TMJ degenerative change. Orbits: Acute right orbital floor fracture with adjacent extraconal retrobulbar hematoma. Small amount of fat herniates through the defect. The inferior rectus is separate from the fracture. The fracture extends across the infraorbital foramen. No proptosis. The globes are symmetric and within normal limits. Unremarkable appearance of the optic nerves and extraocular muscles. Sinuses: Right maxillary hemosinus. Scattered mucosal thickening of ethmoid air cells. Soft tissues: Right periorbital and nasal soft tissue contusions. CT CERVICAL SPINE FINDINGS Alignment: Prominent upper cervical kyphosis with anterolisthesis of C2 on C3 and C3 on C4, favored degenerative/chronic given similar alignment on prior CTA neck 2021. Skull base and vertebrae: No acute fracture. No primary bone lesion or focal pathologic process. Soft tissues and spinal canal: No prevertebral fluid or swelling. No visible canal hematoma. Disc levels: Advanced cervical degenerative change at C2-C3, C3-C4,  C4-C5, and C5-C6. Degenerative disc disease is greatest on the right at C5-C6 where there is disc height loss, endplate sclerosis and spurring. Bony fusion across the right C3-C4 facet joint. Multilevel posterior disc osteophyte complexes and facet/uncovertebral hypertrophy with varying degrees of canal and foraminal stenosis. Upper chest: Biapical pleuroparenchymal scarring with areas of calcification. Other: Calcific atherosclerosis. IMPRESSION: CT head: 1. No evidence of acute intracranial abnormality. Similar mild for age atrophy and chronic microvascular ischemic disease. 2. Frontal scalp contusion/laceration without acute calvarial fracture. CT maxillofacial: 1. Right periorbital soft tissue contusion with acute mildly displaced right orbital floor fracture and adjacent extraconal retrobulbar hematoma and right maxillary hemosinus. Small amount of herniated fat through the defect. The fracture extends across the infraorbital foramen. 2. Slightly displaced right nasal bone fracture with overlying nasal contusion. 3. Left greater than right TMJ degenerative change. CT cervical spine: 1. No evidence of acute fracture. 2. Prominent upper cervical kyphosis with anterolisthesis of C2 on C3 and C3 on C4, favored degenerative/chronic given similar alignment on prior CTA neck 2021. 3. Advanced upper cervical degenerative change with multilevel spinal and foraminal stenosis. An MRI could further evaluate the canal and foramina clinically indicated. Electronically Signed   By: Margaretha Sheffield MD   On: 09/06/2020 12:39   DG Pelvis Portable  Result Date: 09/06/2020 CLINICAL DATA:  Fall. EXAM: PORTABLE PELVIS 1-2 VIEWS COMPARISON:  CT abdomen pelvis dated October 27, 2018. FINDINGS: There is no evidence of pelvic fracture or diastasis. No pelvic bone lesions are seen. Prior lumbar fusion. IMPRESSION: Negative. Electronically Signed   By: Titus Dubin M.D.   On: 09/06/2020 13:35   DG Chest Port 1 View  Result  Date: 09/06/2020 CLINICAL DATA:  Fall.  No chest complaints. EXAM: PORTABLE CHEST 1 VIEW COMPARISON:  Chest x-ray dated March 31, 2020. FINDINGS: The heart size and mediastinal contours are within normal limits. Status post mitral and tricuspid valve replacements. Normal pulmonary vascularity. Mild biapical scarring again noted. No focal consolidation, pleural effusion, or pneumothorax. No acute osseous abnormality. IMPRESSION: No active disease. Electronically Signed   By: Titus Dubin M.D.   On: 09/06/2020 13:34   DG Hand Complete Right  Result Date: 09/06/2020 CLINICAL DATA:  87 year old female status post fall with pain and tingling in the right thumb, 1st metacarpal. EXAM: RIGHT HAND - COMPLETE 3+ VIEW COMPARISON:  None. FINDINGS: Diffuse severe osteoarthritis in the right hand. Associated mild MCP and IP joint subluxations. No superimposed acute fracture or dislocation identified. The 1st metacarpal and phalanges appear intact. Mild to moderate 1st CMC joint degeneration. IMPRESSION: Diffuse severe osteoarthritis with no acute fracture or dislocation identified. Electronically Signed   By: Genevie Ann M.D.   On: 09/06/2020 11:12   CT Maxillofacial Wo Contrast  Result Date: 09/06/2020 CLINICAL DATA:  Trauma. EXAM: CT HEAD WITHOUT CONTRAST CT MAXILLOFACIAL WITHOUT CONTRAST CT CERVICAL SPINE WITHOUT CONTRAST TECHNIQUE: Multidetector CT imaging of the head, cervical spine, and maxillofacial structures were performed using the standard protocol without intravenous contrast. Multiplanar CT image reconstructions of the cervical spine and maxillofacial structures were also generated. COMPARISON:  CTA neck March 26 21.  CT head 03/29/2020. FINDINGS: CT HEAD FINDINGS Brain: No evidence of acute infarction, hemorrhage, hydrocephalus, extra-axial collection or mass lesion/mass effect. Similar mild for age patchy white matter hypoattenuation, compatible with the sequela of chronic microvascular ischemic disease.  Similar mild for age atrophy. Vascular: Calcific atherosclerosis. No hyperdense vessel identified. Skull: Frontal scalp contusion/laceration without acute fracture. Other: No mastoid effusions. CT MAXILLOFACIAL FINDINGS Osseous: Right orbital floor fracture is described below. Slightly displaced right nasal bone fracture. Bilateral TMJ joints are located. Left greater than right TMJ degenerative change. Orbits: Acute right orbital floor fracture with adjacent extraconal retrobulbar hematoma. Small amount of fat herniates through the defect. The inferior rectus is separate from the fracture. The fracture extends across the infraorbital foramen. No proptosis. The globes are symmetric and within normal limits. Unremarkable appearance of the optic nerves and extraocular muscles. Sinuses: Right maxillary hemosinus. Scattered mucosal thickening of ethmoid air cells. Soft tissues: Right periorbital and nasal soft tissue contusions. CT CERVICAL SPINE FINDINGS Alignment: Prominent upper cervical kyphosis with anterolisthesis of C2 on C3 and C3 on C4, favored degenerative/chronic given similar alignment on prior CTA neck 2021. Skull base and vertebrae: No acute fracture. No primary bone lesion or focal pathologic process. Soft tissues and spinal canal: No prevertebral fluid or swelling. No visible canal hematoma. Disc levels: Advanced cervical degenerative change at C2-C3, C3-C4, C4-C5, and C5-C6. Degenerative disc disease is greatest on the right at C5-C6 where there is disc height loss, endplate sclerosis and spurring. Bony fusion across the right C3-C4 facet joint. Multilevel posterior disc osteophyte complexes and facet/uncovertebral hypertrophy with varying degrees of canal and foraminal stenosis. Upper chest: Biapical pleuroparenchymal scarring with areas of calcification. Other: Calcific atherosclerosis. IMPRESSION: CT head: 1. No evidence of acute intracranial abnormality. Similar mild for age atrophy and chronic  microvascular ischemic disease. 2. Frontal scalp contusion/laceration without acute calvarial fracture. CT maxillofacial: 1. Right periorbital soft tissue contusion with acute mildly displaced right orbital floor fracture and adjacent extraconal retrobulbar hematoma and right maxillary hemosinus. Small amount of herniated fat through the defect. The fracture extends across the  infraorbital foramen. 2. Slightly displaced right nasal bone fracture with overlying nasal contusion. 3. Left greater than right TMJ degenerative change. CT cervical spine: 1. No evidence of acute fracture. 2. Prominent upper cervical kyphosis with anterolisthesis of C2 on C3 and C3 on C4, favored degenerative/chronic given similar alignment on prior CTA neck 2021. 3. Advanced upper cervical degenerative change with multilevel spinal and foraminal stenosis. An MRI could further evaluate the canal and foramina clinically indicated. Electronically Signed   By: Margaretha Sheffield MD   On: 09/06/2020 12:39    Assessment/Plan  1. Closed fracture of orbit, initial encounter [facility]) Recovering with no complications F/ with ENT  2. Closed fracture of nasal bone, initial encounter No further bleeding or difficulty breathing F/U with ENT   3. Periorbital hematoma of right eye Resolving with normal neuro exam   4. Permanent atrial fibrillation (HCC) Irregular on exam Continues with good rate control on digoxin and toprol Has resumed eliquis for CVA risk reduction with no further bleeding.   5. Unsteady gait Chronic and unchanged.  Walker at all times Slow position changes.   I did add neurontin as an intolerance but this seems very unlikely at such a low dose. The pt has a significant cardiac hx and if symptoms reoccur would recommend cardiology eval.   Labs/tests ordered:  * No order type specified * Next appt:  10/10/2020

## 2020-09-20 DIAGNOSIS — M5136 Other intervertebral disc degeneration, lumbar region: Secondary | ICD-10-CM | POA: Diagnosis not present

## 2020-09-20 DIAGNOSIS — M150 Primary generalized (osteo)arthritis: Secondary | ICD-10-CM | POA: Diagnosis not present

## 2020-09-20 DIAGNOSIS — M0579 Rheumatoid arthritis with rheumatoid factor of multiple sites without organ or systems involvement: Secondary | ICD-10-CM | POA: Diagnosis not present

## 2020-09-20 DIAGNOSIS — Z79899 Other long term (current) drug therapy: Secondary | ICD-10-CM | POA: Diagnosis not present

## 2020-09-20 DIAGNOSIS — M8580 Other specified disorders of bone density and structure, unspecified site: Secondary | ICD-10-CM | POA: Diagnosis not present

## 2020-09-20 DIAGNOSIS — Z6822 Body mass index (BMI) 22.0-22.9, adult: Secondary | ICD-10-CM | POA: Diagnosis not present

## 2020-10-05 DIAGNOSIS — N3944 Nocturnal enuresis: Secondary | ICD-10-CM | POA: Diagnosis not present

## 2020-10-05 DIAGNOSIS — R350 Frequency of micturition: Secondary | ICD-10-CM | POA: Diagnosis not present

## 2020-10-05 DIAGNOSIS — N3941 Urge incontinence: Secondary | ICD-10-CM | POA: Diagnosis not present

## 2020-10-05 DIAGNOSIS — R351 Nocturia: Secondary | ICD-10-CM | POA: Diagnosis not present

## 2020-10-07 MED ORDER — LEVOTHYROXINE SODIUM 75 MCG PO TABS: ORAL_TABLET | ORAL | 3 refills | Status: DC

## 2020-10-10 ENCOUNTER — Other Ambulatory Visit: Payer: Self-pay | Admitting: Internal Medicine

## 2020-10-10 ENCOUNTER — Encounter: Payer: Self-pay | Admitting: Adult Health

## 2020-10-10 ENCOUNTER — Other Ambulatory Visit: Payer: Self-pay

## 2020-10-10 ENCOUNTER — Non-Acute Institutional Stay: Payer: Medicare Other | Admitting: Adult Health

## 2020-10-10 VITALS — BP 128/80 | HR 99 | Temp 97.9°F | Ht 62.0 in | Wt 124.0 lb

## 2020-10-10 DIAGNOSIS — I4821 Permanent atrial fibrillation: Secondary | ICD-10-CM | POA: Diagnosis not present

## 2020-10-10 DIAGNOSIS — M170 Bilateral primary osteoarthritis of knee: Secondary | ICD-10-CM

## 2020-10-10 DIAGNOSIS — M06049 Rheumatoid arthritis without rheumatoid factor, unspecified hand: Secondary | ICD-10-CM | POA: Diagnosis not present

## 2020-10-10 DIAGNOSIS — E538 Deficiency of other specified B group vitamins: Secondary | ICD-10-CM

## 2020-10-10 DIAGNOSIS — N3281 Overactive bladder: Secondary | ICD-10-CM | POA: Diagnosis not present

## 2020-10-10 DIAGNOSIS — E782 Mixed hyperlipidemia: Secondary | ICD-10-CM

## 2020-10-10 DIAGNOSIS — G3184 Mild cognitive impairment, so stated: Secondary | ICD-10-CM | POA: Diagnosis not present

## 2020-10-10 DIAGNOSIS — D539 Nutritional anemia, unspecified: Secondary | ICD-10-CM

## 2020-10-10 DIAGNOSIS — Z20822 Contact with and (suspected) exposure to covid-19: Secondary | ICD-10-CM | POA: Diagnosis not present

## 2020-10-10 NOTE — Progress Notes (Signed)
Location: [facility]  POS:  clinic  Provider:  Cindi Carbon, [place] [phone]   Code Status: DNR Goals of Care:  Advanced Directives 10/10/2020  Does Patient Have a Medical Advance Directive? Yes  Type of Paramedic of [place];Living will  Does patient want to make changes to medical advance directive? No - Patient declined  Copy of [place] in Chart? -  Would patient like information on creating a medical advance directive? -  Pre-existing out of facility DNR order (yellow form or pink MOST form) -     Chief Complaint  Patient presents with   Medical Management of Chronic Issues    Patient returns to the clinic for her 4 month follow up.     HPI: Patient is a 87 y.o. female seen today for medical management of chronic diseases.    [place] and hit her head in June and had a periorbital hematoma of the right eye and nasal bone fracture. Continues to heal with no issues with dizziness or visual changes, difficulty breathing etc.   She was seen by urology and started on myrbetriq for OAB. She has only been on it a few days and she is not sure if it is helping yet. She is not having any s/e  Continue on eliquis for afib with prior hx of CVA as well as mitral and tricuspid valve repair   Has memory loss and feels its getting worse but feels [place] in [place]. Able to ambulate with a walker/dress/bath. Has support from her daughter for apts/meds etc  No issues with weight loss or weight gain, or cold intolerance on synthroid   RA: says he just saw her rheumatologist. Has some right index finger pain and swelling today but no significant changes.     Past Medical History:  Diagnosis Date   Anemia    Anxiety    Arthritis    Back   Atrial fibrillation ([place])    Cancer ([place]) 2006   Colon.  Basal Cell Skin cancer- right arm   Colon cancer (H[facility])    Constipation due to pain medication  therapy    after heart surgery   Dysrhythmia    PAF   GERD (gastroesophageal reflux disease)    Heart murmur    Hypertension    Hypothyroidism    RA (rheumatoid arthritis) ([place])    Restless leg    Seizures ([place])    after Heart Surgey   Stroke ([place])    TIA- found by neurologist after     Past Surgical History:  Procedure Laterality Date   ABDOMINAL HYSTERECTOMY  1970   Partial    COLON RESECTION  2006   cancer   COLONOSCOPY     EYE SURGERY Bilateral    Cataract   MAXIMUM ACCESS (MAS)POSTERIOR LUMBAR INTERBODY FUSION (PLIF) 2 LEVEL N/A 04/12/2015   Procedure: Lumbar Three-Five Decompression, Pedicle Screw Fixation, and Posteriolateral Arthrodesis;  Surgeon: Erline Levine, MD;  Location: [facility];  Service: Neurosurgery;  Laterality: N/A;  L3-4 L4-5 Maximum access posterior lumbar fusion, possible interbodies and resection of synovial cyst at L4-5   MITRAL VALVE REPAIR  01/20/13   Gore-tex cords to P1, P2, and P3. Magic suture to posterior medial commisure, #30 Physio 1 ring. Done in [place]   TONSILLECTOMY     about [place]  01/20/13   #28 TriAd ring done in [place]    Allergies  Allergen Reactions   Codeine Other (See Comments)    "just don't take it well"   Gabapentin     dizziness   Molds & Smuts    Pollen Extract Swelling   Bee Venom Swelling and Rash    Outpatient Encounter Medications as of 10/10/2020  Medication Sig   acetaminophen (TYLENOL) 325 MG tablet Take 2 tablets (650 mg total) by mouth every 6 (six) hours as needed for mild pain or fever.   apixaban (ELIQUIS) 2.5 MG TABS tablet Take 1 tablet (2.5 mg total) by mouth 2 (two) times daily.   atorvastatin (LIPITOR) 20 MG tablet TAKE 1 TABLET DAILY   Cholecalciferol (VITAMIN D) 50 MCG (2000 UT) CAPS Take 1 capsule by mouth daily.   Coenzyme Q10 (CO Q 10 PO) Take 60 mg by mouth daily.   digoxin (LANOXIN) 0.125 MG tablet TAKE 1 TABLET DAILY   ENBREL SURECLICK 50 MG/ML injection     leflunomide (ARAVA) 20 MG tablet Take 20 mg by mouth daily.   levothyroxine (SYNTHROID) 75 MCG tablet Take one tablet by mouth once daily 30 minutes before breakfast on empty stomach.   metoprolol succinate (TOPROL-XL) 50 MG 24 hr tablet TAKE 1 TABLET DAILY   mirabegron ER (MYRBETRIQ) 25 MG TB24 tablet Take 25 mg by mouth daily. Patient unsure of dose   mirtazapine (REMERON) 15 MG tablet Take 1 tablet (15 mg total) by mouth at bedtime.   Multiple Vitamins-Minerals (PRESERVISION AREDS 2) CAPS Take 2 Doses by mouth daily at 6 (six) AM. For eye health   omeprazole (PRILOSEC) 40 MG capsule TAKE 1 CAPSULE DAILY   oxymetazoline (AFRIN) 0.05 % nasal spray Place 2 sprays into both nostrils 2 (two) times daily as needed for congestion (Bleeding).   Psyllium (METAMUCIL MULTIHEALTH FIBER) 63 % POWD Take 1 Scoop by mouth in the morning.   sodium chloride (OCEAN) 0.65 % SOLN nasal spray Place 2 sprays into both nostrils every 4 (four) hours as needed for congestion.   valsartan (DIOVAN) 80 MG tablet TAKE 1 TABLET DAILY   vitamin B-12 (CYANOCOBALAMIN) 1000 MCG tablet Take 1,000 mcg by mouth daily.    [DISCONTINUED] donepezil (ARICEPT) 10 MG tablet Take 10 mg by mouth at bedtime.   No facility-administered encounter medications on file as of 10/10/2020.    Review of Systems:  Review of Systems  Constitutional:  Negative for activity change, appetite change, chills, diaphoresis, fatigue, fever and unexpected weight change.  HENT:  Negative for congestion, mouth sores, nosebleeds, postnasal drip, sinus pressure, sinus pain and trouble swallowing.   Respiratory:  Negative for cough, shortness of breath and wheezing.   Cardiovascular:  Negative for chest pain, palpitations and leg swelling.  Gastrointestinal:  Negative for abdominal distention, abdominal pain, constipation and diarrhea.  Genitourinary:  Positive for frequency. Negative for difficulty urinating and dysuria.  Musculoskeletal:  Positive for  arthralgias, gait problem and joint swelling. Negative for back pain and myalgias.  Neurological:  Negative for dizziness, tremors, seizures, syncope, facial asymmetry, speech difficulty, weakness, light-headedness, numbness and headaches.  Psychiatric/Behavioral:  Negative for agitation, behavioral problems and confusion (memory loss).    Health Maintenance  Topic Date Due   COVID-19 Vaccine (4 - Booster for Moderna series) 05/11/2020   INFLUENZA VACCINE  10/24/2020   TETANUS/TDAP  04/16/2027   DEXA SCAN  Completed   PNA vac Low Risk Adult  Completed   Zoster Vaccines- Shingrix  Completed   HPV VACCINES  Aged Out    Physical Exam: Vitals:  10/10/20 1403  BP: 128/80  Pulse: 99  Temp: 97.9 F (36.6 C)  SpO2: 95%  Weight: 124 lb (56.2 kg)  Height: 5\' 2"  (1.575 m)   Body mass index is 22.68 kg/m. Physical Exam Vitals and nursing note reviewed.  Constitutional:      General: She is not in acute distress.    Appearance: She is not diaphoretic.  HENT:     Head:     Comments: Improved bruising to both orbits. No swelling     Right Ear: Tympanic membrane and ear canal normal.     Left Ear: Tympanic membrane and ear canal normal.     Nose: Nose normal. No congestion.     Mouth/Throat:     Mouth: Mucous membranes are moist.     Pharynx: Oropharynx is clear. No oropharyngeal exudate.  Eyes:     General:        Right eye: No discharge.        Left eye: No discharge.     Conjunctiva/sclera: Conjunctivae normal.     Pupils: Pupils are equal, round, and reactive to light.  Neck:     Vascular: No JVD.  Cardiovascular:     Rate and Rhythm: Normal rate. Rhythm irregular.     Heart sounds: No murmur heard. Pulmonary:     Effort: Pulmonary effort is normal. No respiratory distress.     Breath sounds: Normal breath sounds. No wheezing.  Abdominal:     General: Bowel sounds are normal. There is no distension.     Palpations: Abdomen is soft.     Tenderness: There is no abdominal  tenderness.  Musculoskeletal:        General: Deformity (both hands) present.     Right lower leg: No edema.     Left lower leg: No edema.  Skin:    General: Skin is warm and dry.  Neurological:     Mental Status: She is alert and oriented to person, place, and time.  Psychiatric:        Mood and Affect: Mood normal.    Labs reviewed: Basic Metabolic Panel: Recent Labs    03/29/20 1537 04/05/20 0000 06/09/20 0000 09/06/20 1127 09/07/20 0037  NA 137   < > 134* 135 138  K 4.4   < > 4.4 4.0 4.3  CL 104   < > 101 102 102  CO2  --    < > 22 25 26   GLUCOSE 103*  --   --  114* 96  BUN 23   < > 22* 19 15  CREATININE 0.90   < > 0.8 0.75 0.85  CALCIUM  --    < > 9.3 9.2 9.1   < > = values in this interval not displayed.   Liver Function Tests: Recent Labs    03/29/20 0000 03/29/20 1525 04/05/20 0000  AST  --  24 23  ALT  --  23 20  ALKPHOS  --  72 91  BILITOT  --  0.8  --   PROT  --  6.5  --   ALBUMIN 4.5 3.9  --    No results for input(s): LIPASE, AMYLASE in the last 8760 hours. No results for input(s): AMMONIA in the last 8760 hours. CBC: Recent Labs    03/29/20 1525 03/29/20 1537 08/02/20 0000 09/06/20 1127 09/07/20 0037  WBC 6.9   < > 4.6 10.1 8.5  NEUTROABS 3.1  --   --   --   --  HGB 10.9*   < > 10.7* 10.3* 10.1*  HCT 32.6*   < > 32* 30.6* 30.4*  MCV 105.8*  --   --  103.0* 104.5*  PLT 169   < > 189 172 188   < > = values in this interval not displayed.   Lipid Panel: No results for input(s): CHOL, HDL, LDLCALC, TRIG, CHOLHDL, LDLDIRECT in the last 8760 hours. Lab Results  Component Value Date   HGBA1C 5.5 01/12/2013    Procedures since last visit: No results found.  Assessment/Plan  1. Overactive bladder Trying myrbetriq Let her know to monitor for urinary retention BP ok F/u with urology   2. Macrocytic anemia Lab Results  Component Value Date   HGB 10.1 (L) 09/07/2020   Has macrocytosis not new Continue to monitor Likely has  anemia of chronic disease from RA as well   3. Permanent atrial fibrillation ([place]) Rate is controlled Has prior hx oc CVA Doing well on Eliquis   4. Mild cognitive impairment with memory loss Progressing over time but remains independently with ADls  5. Rheumatoid arthritis involving hand with negative rheumatoid factor, unspecified laterality ([place]) Followed by Dr Amil Amen   6. Primary osteoarthritis of both knees Bothersome at times, uses a walker   7. Vitamin B12 deficiency Continue B12 supplementation   8. Mixed hyperlipidemia Continue lipitor 20 mg qd  Check lipids at next visit   Labs/tests ordered: CBC BMP TSH lipid panel prior to next apt with Dr. Lyndel Safe in 4 months  Next appt:  02/08/21   Total time 93min:  time greater than 50% of total time spent doing pt counseling and coordination of care

## 2020-10-10 NOTE — Patient Instructions (Signed)
Follow up in 4 months to meet Dr. Lyndel Safe and have labs drawn

## 2020-10-12 ENCOUNTER — Encounter: Payer: Self-pay | Admitting: Internal Medicine

## 2020-11-24 ENCOUNTER — Telehealth: Payer: Self-pay | Admitting: Family

## 2020-11-24 NOTE — Telephone Encounter (Signed)
Call received from [place] states patient called to request for Paxlovid states had a home kit for COVID-19 test positive today.On chart review latest GFR > 60 but patient current on EliQis 2.5 mg one by mouth twice daily unable to further reduce per medication interaction. Advised to take Vitamin D 2000 units one by mouth daily x 14 days  Vitamin C 500 mg tablet one by mouth twice daily x 14 days and Zinc 50 mg tablet one by mouth daily x 14 days. - Advised to notify provider for any worsening symptoms shortness of breath.chest tightness,chest pain or palpitation. Please follow up.

## 2020-12-20 ENCOUNTER — Other Ambulatory Visit: Payer: Self-pay | Admitting: Internal Medicine

## 2020-12-27 DIAGNOSIS — M7989 Other specified soft tissue disorders: Secondary | ICD-10-CM | POA: Diagnosis not present

## 2020-12-27 DIAGNOSIS — M5136 Other intervertebral disc degeneration, lumbar region: Secondary | ICD-10-CM | POA: Diagnosis not present

## 2020-12-27 DIAGNOSIS — Z79899 Other long term (current) drug therapy: Secondary | ICD-10-CM | POA: Diagnosis not present

## 2020-12-27 DIAGNOSIS — M150 Primary generalized (osteo)arthritis: Secondary | ICD-10-CM | POA: Diagnosis not present

## 2020-12-27 DIAGNOSIS — M0579 Rheumatoid arthritis with rheumatoid factor of multiple sites without organ or systems involvement: Secondary | ICD-10-CM | POA: Diagnosis not present

## 2020-12-27 DIAGNOSIS — M8580 Other specified disorders of bone density and structure, unspecified site: Secondary | ICD-10-CM | POA: Diagnosis not present

## 2020-12-27 DIAGNOSIS — Z6822 Body mass index (BMI) 22.0-22.9, adult: Secondary | ICD-10-CM | POA: Diagnosis not present

## 2020-12-30 DIAGNOSIS — D649 Anemia, unspecified: Secondary | ICD-10-CM | POA: Diagnosis not present

## 2020-12-30 LAB — BASIC METABOLIC PANEL
BUN: 18 (ref 4–21)
CO2: 23 — AB (ref 13–22)
Chloride: 100 (ref 99–108)
Creatinine: 0.8 (ref 0.5–1.1)
Glucose: 97
Potassium: 4.4 (ref 3.4–5.3)
Sodium: 133 — AB (ref 137–147)

## 2020-12-30 LAB — HEPATIC FUNCTION PANEL
ALT: 16 (ref 7–35)
AST: 21 (ref 13–35)
Alkaline Phosphatase: 96 (ref 25–125)
Bilirubin, Total: 0.2

## 2020-12-30 LAB — CBC: RBC: 2.67 — AB (ref 3.87–5.11)

## 2020-12-30 LAB — COMPREHENSIVE METABOLIC PANEL
Albumin: 4.1 (ref 3.5–5.0)
Calcium: 9 (ref 8.7–10.7)
Globulin: 2.4

## 2020-12-30 LAB — CBC AND DIFFERENTIAL
HCT: 26 — AB (ref 36–46)
Hemoglobin: 8.7 — AB (ref 12.0–16.0)
Platelets: 240 (ref 150–399)
WBC: 7

## 2021-01-02 ENCOUNTER — Other Ambulatory Visit: Payer: Self-pay | Admitting: *Deleted

## 2021-01-02 ENCOUNTER — Encounter: Payer: Self-pay | Admitting: Adult Health

## 2021-01-02 ENCOUNTER — Other Ambulatory Visit: Payer: Self-pay

## 2021-01-02 ENCOUNTER — Non-Acute Institutional Stay: Payer: Medicare Other | Admitting: Adult Health

## 2021-01-02 VITALS — BP 142/86 | HR 91 | Temp 97.3°F | Ht 62.0 in | Wt 123.8 lb

## 2021-01-02 DIAGNOSIS — D500 Iron deficiency anemia secondary to blood loss (chronic): Secondary | ICD-10-CM

## 2021-01-02 DIAGNOSIS — I4821 Permanent atrial fibrillation: Secondary | ICD-10-CM

## 2021-01-02 MED ORDER — APIXABAN 2.5 MG PO TABS: 2.5000 mg | ORAL_TABLET | Freq: Two times a day (BID) | ORAL | 3 refills | Status: DC

## 2021-01-02 MED ORDER — VALSARTAN 80 MG PO TABS: 80.0000 mg | ORAL_TABLET | Freq: Every day | ORAL | 3 refills | Status: DC

## 2021-01-02 MED ORDER — IRON (FERROUS SULFATE) 325 (65 FE) MG PO TABS: 325.0000 mg | ORAL_TABLET | Freq: Every day | ORAL | 3 refills | Status: DC

## 2021-01-02 NOTE — Patient Instructions (Signed)
Try taking iron once daily and if not tolerating well due to stomach upset let us know  Follow up with gastroenterology  Check Hgb later this week and then again in one month

## 2021-01-02 NOTE — Progress Notes (Signed)
Location:  [name]  POS: clinic  Provider:  Cindi Carbon, [place] [phone]   Code Status:  Goals of Care:  Advanced Directives 01/02/2021  Does Patient Have a Medical Advance Directive? Yes  Type of Advance Directive [place]  Does patient want to make changes to medical advance directive? No - Patient declined  Copy of [place] in Chart? Yes - validated most recent copy scanned in chart (See row information)  Would patient like information on creating a medical advance directive? -  Pre-existing out of facility DNR order (yellow form or pink MOST form) -     Chief Complaint  Patient presents with   Medical Management of Chronic Issues    Patient returns to the clinic for follow up.    Quality Metric Gaps    #4 covid vaccine, Flu shot    HPI: Patient is a 87 y.o. female seen today for an acute visit for anemia.  She has RA  and is followed by rheumatology. Also has a hx of afib and CVA and is on Eliquis. She has memory loss and lives independently with the support of her daughter.  Hgb 10.1 09/07/20, seen by rheumatologist recently and f/u CBC showed Hgb of 8.7 at their office.  Stool was positive for occult blood on 10/7.  She denies any obvious blood in her stool, weight loss, appetite change, abd pain, fever, or indigestion. Takes prilosec daily She does have a hx of colon ca s/p colectomy years ago. Past Medical History:  Diagnosis Date   Anemia    Anxiety    Arthritis    Back   Atrial fibrillation ([place])    Cancer ([place]) 2006   Colon.  Basal Cell Skin cancer- right arm   Colon cancer (H[facility])    Constipation due to pain medication therapy    after heart surgery   Dysrhythmia    PAF   GERD (gastroesophageal reflux disease)    Heart murmur    Hypertension    Hypothyroidism    RA (rheumatoid arthritis) ([place])    Restless leg    Seizures ([place])    after Heart Surgey   Stroke ([place])    TIA- found by  neurologist after     Past Surgical History:  Procedure Laterality Date   ABDOMINAL HYSTERECTOMY  1970   Partial    COLON RESECTION  2006   cancer   COLONOSCOPY     EYE SURGERY Bilateral    Cataract   MAXIMUM ACCESS (MAS)POSTERIOR LUMBAR INTERBODY FUSION (PLIF) 2 LEVEL N/A 04/12/2015   Procedure: Lumbar Three-Five Decompression, Pedicle Screw Fixation, and Posteriolateral Arthrodesis;  Surgeon: Erline Levine, MD;  Location: [facility];  Service: Neurosurgery;  Laterality: N/A;  L3-4 L4-5 Maximum access posterior lumbar fusion, possible interbodies and resection of synovial cyst at L4-5   MITRAL VALVE REPAIR  01/20/13   Gore-tex cords to P1, P2, and P3. Magic suture to posterior medial commisure, #30 Physio 1 ring. Done in [place]   TONSILLECTOMY     about [place]  01/20/13   #28 TriAd ring done in [place]    Allergies  Allergen Reactions   Codeine Other (See Comments)    "just don't take it well"   Gabapentin     dizziness   Molds & Smuts    Pollen Extract Swelling   Bee Venom Swelling and Rash    Outpatient Encounter Medications as of 01/02/2021  Medication Sig   acetaminophen (TYLENOL) 325 MG tablet Take 2 tablets (650 mg total) by mouth every 6 (six) hours as needed for mild pain or fever.   amoxicillin (AMOXIL) 500 MG capsule Take 500 mg by mouth. Before dental visits every 6 months   apixaban (ELIQUIS) 2.5 MG TABS tablet Take 1 tablet (2.5 mg total) by mouth 2 (two) times daily.   atorvastatin (LIPITOR) 20 MG tablet TAKE 1 TABLET DAILY   Cholecalciferol (VITAMIN D) 50 MCG (2000 UT) CAPS Take 1 capsule by mouth daily.   digoxin (LANOXIN) 0.125 MG tablet TAKE 1 TABLET DAILY   ENBREL SURECLICK 50 MG/ML injection    Iron, Ferrous Sulfate, 325 (65 Fe) MG TABS Take 325 mg by mouth daily.   leflunomide (ARAVA) 20 MG tablet Take 20 mg by mouth daily.   levothyroxine (SYNTHROID) 75 MCG tablet Take one tablet by mouth once daily 30 minutes before  breakfast on empty stomach.   metoprolol succinate (TOPROL-XL) 50 MG 24 hr tablet TAKE 1 TABLET DAILY   mirtazapine (REMERON) 15 MG tablet Take 1 tablet (15 mg total) by mouth at bedtime.   Multiple Vitamins-Minerals (PRESERVISION AREDS 2) CAPS Take 2 Doses by mouth daily at 6 (six) AM. For eye health   omeprazole (PRILOSEC) 40 MG capsule TAKE 1 CAPSULE DAILY   Psyllium (METAMUCIL MULTIHEALTH FIBER) 63 % POWD Take 1 Scoop by mouth in the morning.   valsartan (DIOVAN) 80 MG tablet Take 1 tablet (80 mg total) by mouth daily.   vitamin B-12 (CYANOCOBALAMIN) 1000 MCG tablet Take 1,000 mcg by mouth daily.    [DISCONTINUED] apixaban (ELIQUIS) 2.5 MG TABS tablet Take 1 tablet (2.5 mg total) by mouth 2 (two) times daily.   [DISCONTINUED] Coenzyme Q10 (CO Q 10 PO) Take 60 mg by mouth daily.   [DISCONTINUED] mirabegron ER (MYRBETRIQ) 25 MG TB24 tablet Take 25 mg by mouth daily. Patient unsure of dose   [DISCONTINUED] oxymetazoline (AFRIN) 0.05 % nasal spray Place 2 sprays into both nostrils 2 (two) times daily as needed for congestion (Bleeding).   [DISCONTINUED] sodium chloride (OCEAN) 0.65 % SOLN nasal spray Place 2 sprays into both nostrils every 4 (four) hours as needed for congestion.   [DISCONTINUED] valsartan (DIOVAN) 80 MG tablet TAKE 1 TABLET DAILY   No facility-administered encounter medications on file as of 01/02/2021.    Review of Systems:  Review of Systems  Constitutional:  Positive for fatigue. Negative for activity change, appetite change, chills, diaphoresis, fever and unexpected weight change.  HENT:  Negative for congestion.   Respiratory:  Negative for cough, shortness of breath (occas with exertion) and wheezing.   Cardiovascular:  Negative for chest pain, palpitations and leg swelling.  Gastrointestinal:  Positive for blood in stool (occult). Negative for abdominal distention, abdominal pain, anal bleeding, constipation, diarrhea, nausea, rectal pain and vomiting.   Genitourinary:  Negative for difficulty urinating and dysuria.  Musculoskeletal:  Positive for arthralgias and gait problem. Negative for back pain, joint swelling and myalgias.  Neurological:  Negative for dizziness (occas), tremors, seizures, syncope, facial asymmetry, speech difficulty, weakness, light-headedness, numbness and headaches.  Psychiatric/Behavioral:  Negative for agitation, behavioral problems and confusion.        Memory loss   Health Maintenance  Topic Date Due   COVID-19 Vaccine (4 - Booster for Moderna series) 05/03/2020   INFLUENZA VACCINE  10/24/2020   TETANUS/TDAP  04/16/2027   DEXA SCAN  Completed   Zoster Vaccines- Shingrix  Completed   HPV VACCINES  Aged Out    Physical Exam: Vitals:   01/02/21 1551  BP: (!) 142/86  Pulse: 91  Temp: (!) 97.3 F (36.3 C)  SpO2: 98%  Weight: 123 lb 12.8 oz (56.2 kg)  Height: 5\' 2"  (1.575 m)   Body mass index is 22.64 kg/m. Physical Exam Vitals and nursing note reviewed.  Constitutional:      General: She is not in acute distress.    Appearance: She is not diaphoretic.  HENT:     Head: Normocephalic and atraumatic.  Neck:     Vascular: No JVD.  Cardiovascular:     Rate and Rhythm: Normal rate. Rhythm irregular.     Heart sounds: No murmur heard. Pulmonary:     Effort: Pulmonary effort is normal. No respiratory distress.     Breath sounds: Normal breath sounds. No wheezing.  Abdominal:     General: Abdomen is flat. Bowel sounds are normal. There is no distension.     Palpations: Abdomen is soft. There is no mass.     Tenderness: There is no abdominal tenderness. There is no right CVA tenderness, left CVA tenderness, guarding or rebound.     Hernia: No hernia is present.  Genitourinary:    Rectum: Normal.  Skin:    General: Skin is warm and dry.  Neurological:     Mental Status: She is alert and oriented to person, place, and time.  Psychiatric:        Mood and Affect: Mood normal.    Labs  reviewed: Basic Metabolic Panel: Recent Labs    03/29/20 1537 04/05/20 0000 06/09/20 0000 09/06/20 1127 09/07/20 0037  NA 137   < > 134* 135 138  K 4.4   < > 4.4 4.0 4.3  CL 104   < > 101 102 102  CO2  --    < > 22 25 26   GLUCOSE 103*  --   --  114* 96  BUN 23   < > 22* 19 15  CREATININE 0.90   < > 0.8 0.75 0.85  CALCIUM  --    < > 9.3 9.2 9.1   < > = values in this interval not displayed.   Liver Function Tests: Recent Labs    03/29/20 0000 03/29/20 1525 04/05/20 0000  AST  --  24 23  ALT  --  23 20  ALKPHOS  --  72 91  BILITOT  --  0.8  --   PROT  --  6.5  --   ALBUMIN 4.5 3.9  --    No results for input(s): LIPASE, AMYLASE in the last 8760 hours. No results for input(s): AMMONIA in the last 8760 hours. CBC: Recent Labs    03/29/20 1525 03/29/20 1537 08/02/20 0000 09/06/20 1127 09/07/20 0037  WBC 6.9   < > 4.6 10.1 8.5  NEUTROABS 3.1  --   --   --   --   HGB 10.9*   < > 10.7* 10.3* 10.1*  HCT 32.6*   < > 32* 30.6* 30.4*  MCV 105.8*  --   --  103.0* 104.5*  PLT 169   < > 189 172 188   < > = values in this interval not displayed.   Lipid Panel: No results for input(s): CHOL, HDL, LDLCALC, TRIG, CHOLHDL, LDLDIRECT in the last 8760 hours. Lab Results  Component Value Date   HGBA1C 5.5 01/12/2013    Procedures since last visit: No results found.  Assessment/Plan  1. Iron deficiency  anemia due to chronic blood loss - Iron, Ferrous Sulfate, 325 (65 Fe) MG TABS; Take 325 mg by mouth daily.  Dispense: 30 tablet; Refill: 3 - Ambulatory referral to Gastroenterology If having stomach upset can reduce to three times weekly I discussed her care with Dr Darnell Level and he daughter Hosie Spangle.  There is some concern given her underlying memory loss and other chronic disease about how aggressive we should be in working this issue up. She can go to see GI and discuss her options. She is already on PPI therapy. BP ok and is non toxic appearing.   2. Afib Discussed that she  should continue taking eliquis at this time due to her risk of CVA.  If worsening bleeding or anemia will discuss with cardiology   Labs/tests ordered:  * No order type specified * CBC with iron panel this week and then again one month prior to apt with Dr. Darnell Level Next appt:  02/08/2021  Total time 40 min  time greater than 50% of total time spent doing pt counseling and coordination of care

## 2021-01-02 NOTE — Telephone Encounter (Signed)
[facility] Requested refill.  °

## 2021-01-03 DIAGNOSIS — D500 Iron deficiency anemia secondary to blood loss (chronic): Secondary | ICD-10-CM

## 2021-01-03 MED ORDER — IRON (FERROUS SULFATE) 325 (65 FE) MG PO TABS: 325.0000 mg | ORAL_TABLET | Freq: Every day | ORAL | 3 refills | Status: DC

## 2021-01-03 NOTE — Telephone Encounter (Signed)
Ok that is fine with me. Sorry for any inconvenience.

## 2021-01-05 DIAGNOSIS — D509 Iron deficiency anemia, unspecified: Secondary | ICD-10-CM | POA: Diagnosis not present

## 2021-01-05 DIAGNOSIS — I4821 Permanent atrial fibrillation: Secondary | ICD-10-CM | POA: Diagnosis not present

## 2021-01-05 DIAGNOSIS — D649 Anemia, unspecified: Secondary | ICD-10-CM | POA: Diagnosis not present

## 2021-01-05 LAB — CBC: RBC: 2.64 — AB (ref 3.87–5.11)

## 2021-01-05 LAB — CBC AND DIFFERENTIAL
HCT: 26 — AB (ref 36–46)
Hemoglobin: 8.6 — AB (ref 12.0–16.0)
Platelets: 240 (ref 150–399)
WBC: 6.1

## 2021-01-05 LAB — IRON,TIBC AND FERRITIN PANEL
Ferritin: 23.43
Iron: 33

## 2021-01-06 ENCOUNTER — Telehealth: Payer: Self-pay | Admitting: Gastroenterology

## 2021-01-06 DIAGNOSIS — Z23 Encounter for immunization: Secondary | ICD-10-CM | POA: Diagnosis not present

## 2021-01-06 NOTE — Telephone Encounter (Signed)
This patient would like to switch providers from you Dr. Tarri Glenn to Dr. Fuller Plan. Pt stated that she was recommended to Dr. Fuller Plan. Is this switch okay with you?

## 2021-01-09 ENCOUNTER — Encounter: Payer: Medicare Other | Admitting: Adult Health

## 2021-01-10 NOTE — Telephone Encounter (Signed)
Spoke with the pt's daughter regarding the recommendation that was given and pt's daughter was not satisfied and stated she would call Dr. Fuller Plan herself. Thank you.

## 2021-01-10 NOTE — Telephone Encounter (Signed)
I am not able to accommodate internal transfers of care at this time.

## 2021-01-16 ENCOUNTER — Ambulatory Visit: Payer: Medicare Other | Admitting: Gastroenterology

## 2021-01-16 ENCOUNTER — Telehealth: Payer: Self-pay

## 2021-01-16 ENCOUNTER — Encounter: Payer: Self-pay | Admitting: Gastroenterology

## 2021-01-16 ENCOUNTER — Other Ambulatory Visit (INDEPENDENT_AMBULATORY_CARE_PROVIDER_SITE_OTHER): Payer: Medicare Other

## 2021-01-16 ENCOUNTER — Ambulatory Visit (INDEPENDENT_AMBULATORY_CARE_PROVIDER_SITE_OTHER): Payer: Medicare Other | Admitting: Gastroenterology

## 2021-01-16 VITALS — BP 120/70 | HR 60 | Ht 62.0 in | Wt 123.0 lb

## 2021-01-16 DIAGNOSIS — Z7901 Long term (current) use of anticoagulants: Secondary | ICD-10-CM | POA: Diagnosis not present

## 2021-01-16 DIAGNOSIS — D649 Anemia, unspecified: Secondary | ICD-10-CM | POA: Diagnosis not present

## 2021-01-16 DIAGNOSIS — D500 Iron deficiency anemia secondary to blood loss (chronic): Secondary | ICD-10-CM

## 2021-01-16 DIAGNOSIS — Z85038 Personal history of other malignant neoplasm of large intestine: Secondary | ICD-10-CM | POA: Diagnosis not present

## 2021-01-16 LAB — FOLATE: Folate: >23.4 ng/mL (ref >5.9)

## 2021-01-16 LAB — IBC + FERRITIN
Ferritin: 31.1 ng/mL (ref 10.0–291.0)
Iron: 69 ug/dL (ref 42–145)
Saturation Ratios: 15.6 % — ABNORMAL LOW (ref 20.0–50.0)
TIBC: 441 ug/dL (ref 250.0–450.0)
Transferrin: 315 mg/dL (ref 212.0–360.0)

## 2021-01-16 LAB — CBC WITH DIFFERENTIAL/PLATELET
Basophils Absolute: 0.2 10*3/uL — ABNORMAL HIGH (ref 0.0–0.1)
Basophils Relative: 2.5 % (ref 0.0–3.0)
Eosinophils Absolute: 0.2 10*3/uL (ref 0.0–0.7)
Eosinophils Relative: 2.3 % (ref 0.0–5.0)
HCT: 26 % — ABNORMAL LOW (ref 36.0–46.0)
Hemoglobin: 8.5 g/dL — ABNORMAL LOW (ref 12.0–15.0)
Lymphocytes Relative: 29.6 % (ref 12.0–46.0)
Lymphs Abs: 2.1 10*3/uL (ref 0.7–4.0)
MCHC: 32.8 g/dL (ref 30.0–36.0)
MCV: 100.6 fl — ABNORMAL HIGH (ref 78.0–100.0)
Monocytes Absolute: 1.3 10*3/uL — ABNORMAL HIGH (ref 0.1–1.0)
Monocytes Relative: 18.2 % — ABNORMAL HIGH (ref 3.0–12.0)
Neutro Abs: 3.4 10*3/uL (ref 1.4–7.7)
Neutrophils Relative %: 47.4 % (ref 43.0–77.0)
Platelets: 238 10*3/uL (ref 150.0–400.0)
RBC: 2.59 Mil/uL — ABNORMAL LOW (ref 3.87–5.11)
RDW: 18 % — ABNORMAL HIGH (ref 11.5–15.5)
WBC: 7.1 10*3/uL (ref 4.0–10.5)

## 2021-01-16 LAB — VITAMIN B12: Vitamin B-12: 1083 pg/mL — ABNORMAL HIGH (ref 211–911)

## 2021-01-16 MED ORDER — PLENVU 140 G PO SOLR: 1.0000 | Freq: Once | ORAL | 0 refills | Status: AC

## 2021-01-16 NOTE — Patient Instructions (Signed)
Your provider has requested that you go to the basement level for lab work before leaving today. Press "B" on the elevator. The lab is located at the first door on the left as you exit the elevator.  You have been scheduled for an endoscopy and colonoscopy. Please follow the written instructions given to you at your visit today. Please pick up your prep supplies at the pharmacy within the next 1-3 days. If you use inhalers (even only as needed), please bring them with you on the day of your procedure.  Due to recent changes in healthcare laws, you may see the results of your imaging and laboratory studies on MyChart before your provider has had a chance to review them.  We understand that in some cases there may be results that are confusing or concerning to you. Not all laboratory results come back in the same time frame and the provider may be waiting for multiple results in order to interpret others.  Please give [place] 48 hours in order for your provider to thoroughly review all the results before contacting the office for clarification of your results.   The [facility] providers would like to encourage you to use [facility] to communicate with providers for non-urgent requests or questions.  Due to long hold times on the telephone, sending your provider a message by [facility] may be a faster and more efficient way to get a response.  Please allow 48 business hours for a response.  Please remember that this is for non-urgent requests.   Thank you for choosing me and [facility].  Pricilla Riffle. Dagoberto Ligas., MD., [facility] Regal

## 2021-01-16 NOTE — Progress Notes (Signed)
    History of Present Illness: This is an 87 year old female with a worsening macrocytic anemia and Hemoccult positive stool.  She is accompanied by her daughter-in-law.  She was evaluated by Thornton Park MD as a new patient via in July 2020 on a virtual visit for abdominal bloating.  Her daughter and son-in-law contacted me regarding switching her GI care to me.  Recommendations at Dr. Tarri Glenn 2020 evaluation included increasing omeprazole to 40 mg daily, beginning a trial of Align, proceeding with a CT AP and a breath test for bacterial overgrowth if the CT scan was negative.  CT AP on October 27, 2018 showed colonic diverticulosis, postop changes from previous right hemicolectomy, prior hysterectomy, aortic atherosclerosis.  Cannot locate the results of the breath test.  Referral notes indicate iron deficiency anemia however I do not have iron studies, ferritin to review.  Her hemoglobin has drifted from 10.7-8.7 over a few months.  She has a history of colon cancer and is status post segmental colon resection in 2006.  She relocated to [place] from [place]. She has GERD and is maintained on omeprazole.  She has a history of atrial fibrillation and is maintained on Eliquis.  Current Medications, Allergies, Past Medical History, Past Surgical History, Family History and Social History were reviewed in [employer] record.   Physical Exam: General: Well developed, well nourished, no acute distress Head: Normocephalic and atraumatic Eyes: Sclerae anicteric, EOMI Ears: Normal auditory acuity Mouth: Not examined, mask on during Covid-19 pandemic Lungs: Clear throughout to auscultation Heart: Regular rate and rhythm; no murmurs, rubs or bruits Abdomen: Soft, non tender and non distended. No masses, hepatosplenomegaly or hernias noted. Normal Bowel sounds Rectal: Deferred to colonoscopy Musculoskeletal: Symmetrical with no gross deformities  Pulses:  Normal pulses  noted Extremities: No clubbing, cyanosis, edema or deformities noted Neurological: Alert oriented x 4, grossly nonfocal Psychological:  Alert and cooperative. Normal mood and affect   Assessment and Recommendations:  Worsening iron deficiency anemia with Hemoccult positive stool.  History of colon cancer status post right hemicolectomy. R/O colorectal neoplasms, ulcer, AVMs and other sources. CBC, Fe, TIBC, ferritin, B12, folate. FeSO4 325 mg po bid with meals and hold for 5 days prior to colonoscopy. Schedule colonoscopy and EGD. The risks (including bleeding, perforation, infection, missed lesions, medication reactions and possible hospitalization or surgery if complications occur), benefits, and alternatives to colonoscopy with possible biopsy and possible polypectomy were discussed with the patient and they consent to proceed.  The risks (including bleeding, perforation, infection, missed lesions, medication reactions and possible hospitalization or surgery if complications occur), benefits, and alternatives to endoscopy with possible biopsy and possible dilation were discussed with the patient and they consent to proceed.  Offered CT AP or no endoscopic studies as alternatives and they prefer evaluation with colonoscopy and EGD.  Afib. Hold Eliquis 2 days before procedure - will instruct when and how to resume after procedure. Low but real risk of cardiovascular event such as heart attack, stroke, embolism, thrombosis or ischemia/infarct of other organs off Eliquis explained and need to seek urgent help if this occurs. The patient consents to proceed. Will communicate by phone or EMR with patient's prescribing provider to confirm that holding Eliquis is reasonable in this case.

## 2021-01-16 NOTE — Telephone Encounter (Signed)
   Meilah Delrosario June 27, 1931 [id number]  Dear Dr. Lyndel Safe:  We have scheduled the above named patient for a(n) EGD/Colon procedure. Our records show that (s)he is on anticoagulation therapy.  Please advise as to whether the patient may come off their therapy of Eliquis 2 days prior to their procedure which is scheduled for 02/22/21.  Please route your response to [employer], CMA.   Sincerely,    [facility]

## 2021-01-24 NOTE — Telephone Encounter (Signed)
Informed patient's daughter her mother can hold Eliquis 2 days prior to her procedure. [name] verbalized understanding.

## 2021-01-24 NOTE — Telephone Encounter (Signed)
Left message for Hosie Spangle (patients daughter) to call our office back.

## 2021-01-27 DIAGNOSIS — Z8673 Personal history of transient ischemic attack (TIA), and cerebral infarction without residual deficits: Secondary | ICD-10-CM | POA: Diagnosis not present

## 2021-01-27 DIAGNOSIS — R41841 Cognitive communication deficit: Secondary | ICD-10-CM | POA: Diagnosis not present

## 2021-01-27 DIAGNOSIS — G3184 Mild cognitive impairment, so stated: Secondary | ICD-10-CM | POA: Diagnosis not present

## 2021-02-01 DIAGNOSIS — G3184 Mild cognitive impairment, so stated: Secondary | ICD-10-CM | POA: Diagnosis not present

## 2021-02-01 DIAGNOSIS — R41841 Cognitive communication deficit: Secondary | ICD-10-CM | POA: Diagnosis not present

## 2021-02-01 DIAGNOSIS — Z8673 Personal history of transient ischemic attack (TIA), and cerebral infarction without residual deficits: Secondary | ICD-10-CM | POA: Diagnosis not present

## 2021-02-02 DIAGNOSIS — I4821 Permanent atrial fibrillation: Secondary | ICD-10-CM | POA: Diagnosis not present

## 2021-02-02 DIAGNOSIS — E782 Mixed hyperlipidemia: Secondary | ICD-10-CM | POA: Diagnosis not present

## 2021-02-02 LAB — LIPID PANEL
Cholesterol: 115 (ref 0–200)
HDL: 48 (ref 35–70)
LDL Cholesterol: 49
Triglycerides: 91 (ref 40–160)

## 2021-02-02 LAB — CBC: RBC: 2.81 — AB (ref 3.87–5.11)

## 2021-02-02 LAB — IRON,TIBC AND FERRITIN PANEL
Ferritin: 84.32
Iron: 40

## 2021-02-02 LAB — CBC AND DIFFERENTIAL
HCT: 29 — AB (ref 36–46)
Hemoglobin: 9.5 — AB (ref 12.0–16.0)
Platelets: 215 (ref 150–399)
WBC: 5.7

## 2021-02-02 LAB — BASIC METABOLIC PANEL
BUN: 14 (ref 4–21)
CO2: 24 — AB (ref 13–22)
Chloride: 99 (ref 99–108)
Creatinine: 0.8 (ref 0.5–1.1)
Glucose: 87
Potassium: 4.7 (ref 3.4–5.3)
Sodium: 137 (ref 137–147)

## 2021-02-02 LAB — COMPREHENSIVE METABOLIC PANEL
Calcium: 9.5 (ref 8.7–10.7)
GFR calc Af Amer: 79.35
GFR calc non Af Amer: 68.46

## 2021-02-02 LAB — TSH: TSH: 2.11 (ref 0.41–5.90)

## 2021-02-06 ENCOUNTER — Encounter: Payer: Medicare Other | Admitting: Family

## 2021-02-07 ENCOUNTER — Encounter: Payer: Self-pay | Admitting: Nurse Practitioner

## 2021-02-07 ENCOUNTER — Telehealth: Payer: Self-pay

## 2021-02-07 ENCOUNTER — Other Ambulatory Visit: Payer: Self-pay

## 2021-02-07 ENCOUNTER — Ambulatory Visit (INDEPENDENT_AMBULATORY_CARE_PROVIDER_SITE_OTHER): Payer: Medicare Other | Admitting: Nurse Practitioner

## 2021-02-07 ENCOUNTER — Encounter: Payer: Medicare Other | Admitting: Orthopedic Surgery

## 2021-02-07 DIAGNOSIS — Z0000 Encounter for general adult medical examination without abnormal findings: Secondary | ICD-10-CM | POA: Diagnosis not present

## 2021-02-07 NOTE — Progress Notes (Signed)
This service is provided via telemedicine  No vital signs collected/recorded due to the encounter was a telemedicine visit.   Location of patient (ex: home, work):  Home  Patient consents to a telephone visit:  Yes, see encounter dated 02/07/2021  Location of the provider (ex: office, home):  [place]  Name of any referring provider:  Veleta Miners, MD  Names of all persons participating in the telemedicine service and their role in the encounter:  Sherrie Mustache, Nurse Practitioner, Carroll Kinds, CMA, and patient.   Time spent on call:  15 minutes with medical assistant

## 2021-02-07 NOTE — Progress Notes (Signed)
Subjective:   Kathy Velez is a 87 y.o. female who presents for Medicare Annual (Subsequent) preventive examination.  Review of Systems     Cardiac Risk Factors include: advanced age (>43men, >73 women);hypertension;dyslipidemia     Objective:    There were no vitals filed for this visit. There is no height or weight on file to calculate BMI.  Advanced Directives 02/07/2021 01/02/2021 10/10/2020 09/12/2020 09/06/2020 06/08/2020 04/04/2020  Does Patient Have a Medical Advance Directive? Yes Yes Yes No No Yes Yes  Type of Industrial/product designer of [place];Living will - - [place];Out of facility DNR (pink MOST or yellow form) [place];Living will  Does patient want to make changes to medical advance directive? No - Patient declined No - Patient declined No - Patient declined - - No - Patient declined -  Copy of [place] in Chart? Yes - validated most recent copy scanned in chart (See row information) Yes - validated most recent copy scanned in chart (See row information) - - - - -  Would patient like information on creating a medical advance directive? - - - No - Patient declined No - Patient declined - -  Pre-existing out of facility DNR order (yellow form or pink MOST form) - - - - - - -    Current Medications (verified) Outpatient Encounter Medications as of 02/07/2021  Medication Sig   acetaminophen (TYLENOL) 325 MG tablet Take 2 tablets (650 mg total) by mouth every 6 (six) hours as needed for mild pain or fever.   amoxicillin (AMOXIL) 500 MG capsule Take 500 mg by mouth. Before dental visits every 6 months   apixaban (ELIQUIS) 2.5 MG TABS tablet Take 1 tablet (2.5 mg total) by mouth 2 (two) times daily.   atorvastatin (LIPITOR) 20 MG tablet TAKE 1 TABLET DAILY   Cholecalciferol (VITAMIN D) 50 MCG (2000 UT) CAPS Take 1 capsule by mouth daily.   digoxin  (LANOXIN) 0.125 MG tablet TAKE 1 TABLET DAILY   ENBREL SURECLICK 50 MG/ML injection    Iron, Ferrous Sulfate, 325 (65 Fe) MG TABS Take 325 mg by mouth daily.   leflunomide (ARAVA) 20 MG tablet Take 20 mg by mouth daily.   levothyroxine (SYNTHROID) 75 MCG tablet Take one tablet by mouth once daily 30 minutes before breakfast on empty stomach.   metoprolol succinate (TOPROL-XL) 50 MG 24 hr tablet TAKE 1 TABLET DAILY   mirtazapine (REMERON) 15 MG tablet Take 1 tablet (15 mg total) by mouth at bedtime.   MULTIPLE VITAMIN PO Take by mouth.   Multiple Vitamins-Minerals (PRESERVISION AREDS 2) CAPS Take 2 Doses by mouth daily at 6 (six) AM. For eye health   omeprazole (PRILOSEC) 40 MG capsule TAKE 1 CAPSULE DAILY   Psyllium (METAMUCIL MULTIHEALTH FIBER) 63 % POWD Take 1 Scoop by mouth in the morning.   valsartan (DIOVAN) 80 MG tablet Take 1 tablet (80 mg total) by mouth daily.   vitamin B-12 (CYANOCOBALAMIN) 1000 MCG tablet Take 1,000 mcg by mouth daily.    No facility-administered encounter medications on file as of 02/07/2021.    Allergies (verified) Codeine, Gabapentin, Molds & smuts, Pollen extract, and Bee venom   History: Past Medical History:  Diagnosis Date   Anemia    Anxiety    Arthritis    Back   Atrial fibrillation ([place])    Cancer ([place]) 2006   Colon.  Basal Cell Skin  cancer- right arm   Colon cancer ([place])    Constipation due to pain medication therapy    after heart surgery   Dysrhythmia    PAF   GERD (gastroesophageal reflux disease)    Heart murmur    Hypertension    Hypothyroidism    RA (rheumatoid arthritis) ([place])    Restless leg    Seizures ([place])    after Heart Surgey   Stroke ([place])    TIA- found by neurologist after    Past Surgical History:  Procedure Laterality Date   ABDOMINAL HYSTERECTOMY  1970   Partial    COLON RESECTION  2006   cancer   COLONOSCOPY     EYE SURGERY Bilateral    Cataract   MAXIMUM ACCESS (MAS)POSTERIOR LUMBAR INTERBODY FUSION  (PLIF) 2 LEVEL N/A 04/12/2015   Procedure: Lumbar Three-Five Decompression, Pedicle Screw Fixation, and Posteriolateral Arthrodesis;  Surgeon: Erline Levine, MD;  Location: [facility];  Service: Neurosurgery;  Laterality: N/A;  L3-4 L4-5 Maximum access posterior lumbar fusion, possible interbodies and resection of synovial cyst at L4-5   MITRAL VALVE REPAIR  01/20/13   Gore-tex cords to P1, P2, and P3. Magic suture to posterior medial commisure, #30 Physio 1 ring. Done in [place]   TONSILLECTOMY     about [place]  01/20/13   #28 TriAd ring done in [place]   Family History  Problem Relation Age of Onset   Lung disease Mother 37   Alcohol abuse Son    Heart disease Father 60   Social History   Socioeconomic History   Marital status: Widowed    Spouse name: Not on file   Number of children: Not on file   Years of education: Not on file   Highest education level: Not on file  Occupational History   Occupation: elementary school teacher    Comment: retired   Tobacco Use   Smoking status: Never   Smokeless tobacco: Never  Vaping Use   Vaping Use: Never used  Substance and Sexual Activity   Alcohol use: Yes    Alcohol/week: 1.0 standard drink    Types: 1 Glasses of wine per week    Comment: 1-2 per week   Drug use: No   Sexual activity: Not Currently  Other Topics Concern   Not on file  Social History Narrative   Social History      Diet? Healthy- low salt, sugar, fat      Do you drink/eat things with caffeine? On occasion      Marital status?        widow                        What year were you married? 1958      Do you live in a house, apartment, assisted living, condo, trailer, etc.? apartment      Is it one or more stories? one      How many persons live in your home? one      Do you have any pets in your home? (please list) no      Highest level of education completed? 4 year college      Current or past profession: Statistician       Do you exercise?             yes  Type & how often? Classes- senior retirement community/ some walking      Advanced Directives      Do you have a living will?      Do you have a DNR form?                                  If not, do you want to discuss one?      Do you have signed POA/HPOA for forms?       Functional Status Completed by: Daughter, Di Kindle      Do you have difficulty bathing or dressing yourself? no      Do you have difficulty preparing food or eating? no      Do you have difficulty managing your medications? no      Do you have difficulty managing your finances? no      Do you have difficulty affording your medications? no   Right handed   Social Determinants of Health   Financial Resource Strain: Not on file  Food Insecurity: Not on file  Transportation Needs: Not on file  Physical Activity: Not on file  Stress: Not on file  Social Connections: Not on file    Tobacco Counseling Counseling given: Not Answered   Clinical Intake:  Pre-visit preparation completed: Yes  Pain : No/denies pain     BMI - recorded: 22 Nutritional Status: BMI of 19-24  Normal Diabetes: No  How often do you need to have someone help you when you read instructions, pamphlets, or other written materials from your doctor or pharmacy?: 1 - Never  Diabetic?no          Activities of Daily Living In your present state of health, do you have any difficulty performing the following activities: 02/07/2021  Hearing? N  Vision? N  Difficulty concentrating or making decisions? Y  Walking or climbing stairs? N  Dressing or bathing? N  Doing errands, shopping? N  Preparing Food and eating ? N  Using the Toilet? N  In the past six months, have you accidently leaked urine? Y  Do you have problems with loss of bowel control? Y  Managing your Medications? Y  Comment family helps  Managing your Finances? Y  Comment family helps   Housekeeping or managing your Housekeeping? Y  Some recent data might be hidden    Patient Care Team: Virgie Dad, MD as PCP - General (Internal Medicine) Debara Pickett Nadean Corwin, MD as PCP - Cardiology (Cardiology) Pieter Partridge, DO as Consulting Physician (Neurology)  Indicate any recent Medical Services you may have received from other than [facility] providers in the past year (date may be approximate).     Assessment:   This is a routine wellness examination for Mikesha.  Hearing/Vision screen Hearing Screening - Comments:: Patient weras hearing aids. Vision Screening - Comments:: Patient has some vision problems. Patient has not had recent eye exam. Patient sees eye doctor at [facility].  Dietary issues and exercise activities discussed: Current Exercise Habits: Structured exercise class, Type of exercise: calisthenics, Time (Minutes): 30, Frequency (Times/Week): 5, Weekly Exercise (Minutes/Week): 150   Goals Addressed   None    Depression Screen PHQ 2/9 Scores 02/07/2021 02/05/2020 08/05/2019 05/29/2019 03/09/2019 02/02/2019 11/12/2018  PHQ - 2 Score 0 0 0 0 0 0 0    Fall Risk Fall Risk  02/07/2021 01/02/2021 10/10/2020 09/12/2020 09/01/2020  Falls in the past year? 0  0 0 1 0  Number falls in past yr: 0 0 0 0 0  Injury with Fall? 0 - - 1 0  Risk for fall due to : No Fall Risks - - - -  Risk for fall due to: Comment - - - - -  Follow up Falls evaluation completed - Falls evaluation completed - -  Comment - - - - -    FALL RISK PREVENTION PERTAINING TO THE HOME:  Any stairs in or around the home? Yes  If so, are there any without handrails? No  Home free of loose throw rugs in walkways, pet beds, electrical cords, etc? Yes  Adequate lighting in your home to reduce risk of falls? Yes   ASSISTIVE DEVICES UTILIZED TO PREVENT FALLS:  Life alert? Yes  Use of a cane, walker or w/c? Yes  Grab bars in the bathroom? Yes  Shower chair or bench in shower? Yes  Elevated toilet seat or a  handicapped toilet? Yes   TIMED UP AND GO:  Was the test performed? No .    Cognitive Function: MMSE - Mini Mental State Exam 07/13/2019 01/28/2018  Orientation to time 5 5  Orientation to Place 5 5  Registration 3 3  Attention/ Calculation 5 5  Recall 3 3  Language- name 2 objects 2 2  Language- repeat 1 1  Language- follow 3 step command 3 3  Language- read & follow direction 1 1  Write a sentence 1 1  Copy design 1 1  Total score 30 30     6CIT Screen 02/07/2021 02/05/2020 02/02/2019  What Year? 0 points 0 points 0 points  What month? 0 points 0 points 0 points  What time? 0 points 0 points 0 points  Count back from 20 0 points 0 points 0 points  Months in reverse 0 points 2 points 0 points  Repeat phrase 2 points 2 points 0 points  Total Score 2 4 0    Immunizations Immunization History  Administered Date(s) Administered   Fluad Quad(high Dose 65+) 01/05/2019   H1N1 04/07/2008   Influenza Whole 12/29/2008, 12/22/2009   Influenza, High Dose Seasonal PF 01/15/2012, 12/23/2014, 01/22/2020   Influenza,inj,Quad PF,6+ Mos 01/22/2014, 12/10/2016, 01/17/2018   Influenza,trivalent, recombinat, inj, PF 01/05/2016   Influenza-Unspecified 04/07/2008, 12/15/2014   Moderna Sars-Covid-2 Vaccination 04/07/2019, 05/05/2019, 02/09/2020   PPD Test 07/17/2010, 04/19/2015, 04/25/2015   Pneumococcal Conjugate-13 11/10/2013   Pneumococcal Polysaccharide-23 12/27/2010, 12/05/2016   Tdap 04/15/2017   Zoster Recombinat (Shingrix) 04/09/2017, 06/18/2017    TDAP status: Up to date  Flu Vaccine status: Up to date  Pneumococcal vaccine status: Up to date  Covid-19 vaccine status: Completed vaccines  Qualifies for Shingles Vaccine? No   Zostavax completed Yes   Shingrix Completed?: Yes  Screening Tests Health Maintenance  Topic Date Due   COVID-19 Vaccine (4 - Booster for Moderna series) 04/05/2020   INFLUENZA VACCINE  10/24/2020   TETANUS/TDAP  04/16/2027   Pneumonia Vaccine  31+ Years old  Completed   DEXA SCAN  Completed   Zoster Vaccines- Shingrix  Completed   HPV VACCINES  Aged Out    Health Maintenance  Health Maintenance Due  Topic Date Due   COVID-19 Vaccine (4 - Booster for Moderna series) 04/05/2020   INFLUENZA VACCINE  10/24/2020    Colorectal cancer screening: No longer required.   Mammogram status: No longer required due to age.  Bone denisty up to date  Lung Cancer Screening: (Low Dose CT Chest recommended  if Age 71-80 years, 3 pack-year currently smoking OR have quit w/in 15years.) does not qualify.    Additional Screening:  Hepatitis C Screening: does not qualify;   Vision Screening: Recommended annual ophthalmology exams for early detection of glaucoma and other disorders of the eye. Is the patient up to date with their annual eye exam?  Yes  Who is the provider or what is the name of the office in which the patient attends annual eye exams? [facility].  If pt is not established with a provider, would they like to be referred to a provider to establish care? No .   Dental Screening: Recommended annual dental exams for proper oral hygiene  Community Resource Referral / Chronic Care Management: CRR required this visit?  No   CCM required this visit?  No      Plan:     I have personally reviewed and noted the following in the patient's chart:   Medical and social history Use of alcohol, tobacco or illicit drugs  Current medications and supplements including opioid prescriptions.  Functional ability and status Nutritional status Physical activity Advanced directives List of other physicians Hospitalizations, surgeries, and ER visits in previous 12 months Vitals Screenings to include cognitive, depression, and falls Referrals and appointments  In addition, I have reviewed and discussed with patient certain preventive protocols, quality metrics, and best practice recommendations. A written personalized care  plan for preventive services as well as general preventive health recommendations were provided to patient.     Lauree Chandler, NP   02/07/2021    Virtual Visit via Telephone Note  I connected withNAME@ on 02/07/21 at 10:00 AM EST by telephone and verified that I am speaking with the correct person using two identifiers.  Location: Patient: home Provider: [facility]    I discussed the limitations, risks, security and privacy concerns of performing an evaluation and management service by telephone and the availability of in person appointments. I also discussed with the patient that there may be a patient responsible charge related to this service. The patient expressed understanding and agreed to proceed.   I discussed the assessment and treatment plan with the patient. The patient was provided an opportunity to ask questions and all were answered. The patient agreed with the plan and demonstrated an understanding of the instructions.   The patient was advised to call back or seek an in-person evaluation if the symptoms worsen or if the condition fails to improve as anticipated.  I provided 12 minutes of non-face-to-face time during this encounter.  Carlos American. Harle Battiest Avs printed and mailed

## 2021-02-07 NOTE — Patient Instructions (Signed)
[name] , Thank you for taking time to come for your Medicare Wellness Visit. I appreciate your ongoing commitment to your health goals. Please review the following plan we discussed and let me know if I can assist you in the future.   Screening recommendations/referrals: Colonoscopy aged out  Mammogram aged out Bone Density up to date  Recommended yearly ophthalmology/optometry visit for glaucoma screening and checkup Recommended yearly dental visit for hygiene and checkup  Vaccinations: Influenza vaccine up to date Pneumococcal vaccine up to date Tdap vaccine up to date Shingles vaccine up to date     Advanced directives: on file.   Conditions/risks identified: advance age, hx of CVA   Next appointment: yearly for AWV    Preventive Care 65 Years and Older, Female Preventive care refers to lifestyle choices and visits with your health care provider that can promote health and wellness. What does preventive care include? A yearly physical exam. This is also called an annual well check. Dental exams once or twice a year. Routine eye exams. Ask your health care provider how often you should have your eyes checked. Personal lifestyle choices, including: Daily care of your teeth and gums. Regular physical activity. Eating a healthy diet. Avoiding tobacco and drug use. Limiting alcohol use. Practicing safe sex. Taking low-dose aspirin every day. Taking vitamin and mineral supplements as recommended by your health care provider. What happens during an annual well check? The services and screenings done by your health care provider during your annual well check will depend on your age, overall health, lifestyle risk factors, and family history of disease. Counseling  Your health care provider may ask you questions about your: Alcohol use. Tobacco use. Drug use. Emotional well-being. Home and relationship well-being. Sexual activity. Eating habits. History of falls. Memory  and ability to understand (cognition). Work and work Statistician. Reproductive health. Screening  You may have the following tests or measurements: Height, weight, and BMI. Blood pressure. Lipid and cholesterol levels. These may be checked every 5 years, or more frequently if you are over 11 years old. Skin check. Lung cancer screening. You may have this screening every year starting at age 77 if you have a 30-pack-year history of smoking and currently smoke or have quit within the past 15 years. Fecal occult blood test (FOBT) of the stool. You may have this test every year starting at age 80. Flexible sigmoidoscopy or colonoscopy. You may have a sigmoidoscopy every 5 years or a colonoscopy every 10 years starting at age 54. Hepatitis C blood test. Hepatitis B blood test. Sexually transmitted disease (STD) testing. Diabetes screening. This is done by checking your blood sugar (glucose) after you have not eaten for a while (fasting). You may have this done every 1-3 years. Bone density scan. This is done to screen for osteoporosis. You may have this done starting at age 84. Mammogram. This may be done every 1-2 years. Talk to your health care provider about how often you should have regular mammograms. Talk with your health care provider about your test results, treatment options, and if necessary, the need for more tests. Vaccines  Your health care provider may recommend certain vaccines, such as: Influenza vaccine. This is recommended every year. Tetanus, diphtheria, and acellular pertussis (Tdap, Td) vaccine. You may need a Td booster every 10 years. Zoster vaccine. You may need this after age 61. Pneumococcal 13-valent conjugate (PCV13) vaccine. One dose is recommended after age 31. Pneumococcal polysaccharide (PPSV23) vaccine. One dose is recommended after  age 40. Talk to your health care provider about which screenings and vaccines you need and how often you need them. This  information is not intended to replace advice given to you by your health care provider. Make sure you discuss any questions you have with your health care provider. Document Released: 04/08/2015 Document Revised: 11/30/2015 Document Reviewed: 01/11/2015 Elsevier Interactive Patient Education  2017 [employer] Prevention in the Home Falls can cause injuries. They can happen to people of all ages. There are many things you can do to make your home safe and to help prevent falls. What can I do on the outside of my home? Regularly fix the edges of walkways and driveways and fix any cracks. Remove anything that might make you trip as you walk through a door, such as a raised step or threshold. Trim any bushes or trees on the path to your home. Use bright outdoor lighting. Clear any walking paths of anything that might make someone trip, such as rocks or tools. Regularly check to see if handrails are loose or broken. Make sure that both sides of any steps have handrails. Any raised decks and porches should have guardrails on the edges. Have any leaves, snow, or ice cleared regularly. Use sand or salt on walking paths during winter. Clean up any spills in your garage right away. This includes oil or grease spills. What can I do in the bathroom? Use night lights. Install grab bars by the toilet and in the tub and shower. Do not use towel bars as grab bars. Use non-skid mats or decals in the tub or shower. If you need to sit down in the shower, use a plastic, non-slip stool. Keep the floor dry. Clean up any water that spills on the floor as soon as it happens. Remove soap buildup in the tub or shower regularly. Attach bath mats securely with double-sided non-slip rug tape. Do not have throw rugs and other things on the floor that can make you trip. What can I do in the bedroom? Use night lights. Make sure that you have a light by your bed that is easy to reach. Do not use any sheets or  blankets that are too big for your bed. They should not hang down onto the floor. Have a firm chair that has side arms. You can use this for support while you get dressed. Do not have throw rugs and other things on the floor that can make you trip. What can I do in the kitchen? Clean up any spills right away. Avoid walking on wet floors. Keep items that you use a lot in easy-to-reach places. If you need to reach something above you, use a strong step stool that has a grab bar. Keep electrical cords out of the way. Do not use floor polish or wax that makes floors slippery. If you must use wax, use non-skid floor wax. Do not have throw rugs and other things on the floor that can make you trip. What can I do with my stairs? Do not leave any items on the stairs. Make sure that there are handrails on both sides of the stairs and use them. Fix handrails that are broken or loose. Make sure that handrails are as long as the stairways. Check any carpeting to make sure that it is firmly attached to the stairs. Fix any carpet that is loose or worn. Avoid having throw rugs at the top or bottom of the stairs. If you do  have throw rugs, attach them to the floor with carpet tape. Make sure that you have a light switch at the top of the stairs and the bottom of the stairs. If you do not have them, ask someone to add them for you. What else can I do to help prevent falls? Wear shoes that: Do not have high heels. Have rubber bottoms. Are comfortable and fit you well. Are closed at the toe. Do not wear sandals. If you use a stepladder: Make sure that it is fully opened. Do not climb a closed stepladder. Make sure that both sides of the stepladder are locked into place. Ask someone to hold it for you, if possible. Clearly mark and make sure that you can see: Any grab bars or handrails. First and last steps. Where the edge of each step is. Use tools that help you move around (mobility aids) if they are  needed. These include: Canes. Walkers. Scooters. Crutches. Turn on the lights when you go into a dark area. Replace any light bulbs as soon as they burn out. Set up your furniture so you have a clear path. Avoid moving your furniture around. If any of your floors are uneven, fix them. If there are any pets around you, be aware of where they are. Review your medicines with your doctor. Some medicines can make you feel dizzy. This can increase your chance of falling. Ask your doctor what other things that you can do to help prevent falls. This information is not intended to replace advice given to you by your health care provider. Make sure you discuss any questions you have with your health care provider. Document Released: 01/06/2009 Document Revised: 08/18/2015 Document Reviewed: 04/16/2014 Elsevier Interactive Patient Education  2017 [employer].

## 2021-02-07 NOTE — Telephone Encounter (Signed)
[name], brunet are scheduled for a virtual visit with your provider today.    Just as we do with appointments in the office, we must obtain your consent to participate.  Your consent will be active for this visit and any virtual visit you may have with one of our providers in the next 365 days.    If you have a MyChart account, I can also send a copy of this consent to you electronically.  All virtual visits are billed to your insurance company just like a traditional visit in the office.  As this is a virtual visit, video technology does not allow for your provider to perform a traditional examination.  This may limit your provider's ability to fully assess your condition.  If your provider identifies any concerns that need to be evaluated in person or the need to arrange testing such as labs, EKG, etc, we will make arrangements to do so.    Although advances in technology are sophisticated, we cannot ensure that it will always work on either your end or our end.  If the connection with a video visit is poor, we may have to switch to a telephone visit.  With either a video or telephone visit, we are not always able to ensure that we have a secure connection.   I need to obtain your verbal consent now.   Are you willing to proceed with your visit today?   Kathy Velez has provided verbal consent on 02/07/2021 for a virtual visit (video or telephone).   Carroll Kinds, CMA 02/07/2021  10:10 AM

## 2021-02-08 ENCOUNTER — Encounter: Payer: Self-pay | Admitting: Internal Medicine

## 2021-02-08 ENCOUNTER — Telehealth: Payer: Self-pay | Admitting: Neurology

## 2021-02-08 ENCOUNTER — Other Ambulatory Visit: Payer: Self-pay

## 2021-02-08 ENCOUNTER — Non-Acute Institutional Stay: Payer: Medicare Other | Admitting: Internal Medicine

## 2021-02-08 ENCOUNTER — Telehealth: Payer: Self-pay | Admitting: Gastroenterology

## 2021-02-08 VITALS — BP 114/78 | HR 89 | Temp 98.0°F | Ht 62.0 in | Wt 120.6 lb

## 2021-02-08 DIAGNOSIS — N3281 Overactive bladder: Secondary | ICD-10-CM

## 2021-02-08 DIAGNOSIS — I10 Essential (primary) hypertension: Secondary | ICD-10-CM | POA: Diagnosis not present

## 2021-02-08 DIAGNOSIS — G3184 Mild cognitive impairment, so stated: Secondary | ICD-10-CM

## 2021-02-08 DIAGNOSIS — R2681 Unsteadiness on feet: Secondary | ICD-10-CM | POA: Diagnosis not present

## 2021-02-08 DIAGNOSIS — R42 Dizziness and giddiness: Secondary | ICD-10-CM

## 2021-02-08 DIAGNOSIS — I4821 Permanent atrial fibrillation: Secondary | ICD-10-CM

## 2021-02-08 DIAGNOSIS — M06049 Rheumatoid arthritis without rheumatoid factor, unspecified hand: Secondary | ICD-10-CM

## 2021-02-08 DIAGNOSIS — D500 Iron deficiency anemia secondary to blood loss (chronic): Secondary | ICD-10-CM | POA: Diagnosis not present

## 2021-02-08 DIAGNOSIS — E782 Mixed hyperlipidemia: Secondary | ICD-10-CM | POA: Diagnosis not present

## 2021-02-08 DIAGNOSIS — E039 Hypothyroidism, unspecified: Secondary | ICD-10-CM

## 2021-02-08 NOTE — Progress Notes (Signed)
Location:  [place] of Service:  Clinic (12)  Provider:   Code Status:  Goals of Care:  Advanced Directives 02/08/2021  Does Patient Have a Medical Advance Directive? Yes  Type of Advance Directive [place]  Does patient want to make changes to medical advance directive? No - Patient declined  Copy of [place] in Chart? Yes - validated most recent copy scanned in chart (See row information)  Would patient like information on creating a medical advance directive? -  Pre-existing out of facility DNR order (yellow form or pink MOST form) -     Chief Complaint  Patient presents with   Medical Management of Chronic Issues    Patient returns to the clinic today for her 4 month follow up.     HPI: Patient is a 87 y.o. female seen today for medical management of chronic diseases.    Patient has h/o Iron Def Anemia Has follow up with Dr Fuller Plan in GI. She has a history of colon cancer and is status post segmental colon resection in 2006. Now on Iron and her Hgb has improved Plan for Colonoscopy which patient is ok with but want to discuss with GI now that her Hgb has improved Having no symptoms of abdominal pain. Appetite is good. Has gained weight Dizziness  Especially when she stands up Now needs her walker all the time  Last fall was few months ago thought to be due to Neurontin Urinary Frequency Continues to have issue with getting up at night due to urinary frequency.  Was put on Myrbetriq by her urologist but is not taking it due to not getting it approved by insurance.  Does not want to try any new medication due to schedule colonoscopy [facility] Has seen neurology before.  Last MMSE in 4/21 was 30/30.  Was on Aricept before By Dr. 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 Patient's daughter  have noticed significant worsening in her  cognition. History of rheumatoid arthritis Mainly in her hands follows with Dr. Amil Amen Patient also has a history of A. fib on Eliquis digoxin Toprol follows with cardiology History of hypertension and hyperlipidemia  Walking with her walker Independent in her ADLs.  Daughter is helping with  IADLs.  Does not drive anymore  Past Medical History:  Diagnosis Date   Anemia    Anxiety    Arthritis    Back   Atrial fibrillation ([place])    Cancer ([place]) 2006   Colon.  Basal Cell Skin cancer- right arm   Colon cancer (HCC)    Constipation due to pain medication therapy    after heart surgery   Dysrhythmia    PAF   GERD (gastroesophageal reflux disease)    Heart murmur    Hypertension    Hypothyroidism    RA (rheumatoid arthritis) ([place])    Restless leg    Seizures ([place])    after Heart Surgey   Stroke ([place])    TIA- found by neurologist after     Past Surgical History:  Procedure Laterality Date   ABDOMINAL HYSTERECTOMY  1970   Partial    COLON RESECTION  2006   cancer   COLONOSCOPY     EYE SURGERY Bilateral    Cataract   MAXIMUM ACCESS (MAS)POSTERIOR LUMBAR INTERBODY FUSION (PLIF) 2 LEVEL N/A 04/12/2015   Procedure: Lumbar Three-Five Decompression, Pedicle Screw Fixation,  and Posteriolateral Arthrodesis;  Surgeon: Erline Levine, MD;  Location: [place] [place] [facility];  Service: Neurosurgery;  Laterality: N/A;  L3-4 L4-5 Maximum access posterior lumbar fusion, possible interbodies and resection of synovial cyst at L4-5   MITRAL VALVE REPAIR  01/20/13   Gore-tex cords to P1, P2, and P3. Magic suture to posterior medial commisure, #30 Physio 1 ring. Done in [place]   TONSILLECTOMY     about [place]  01/20/13   #28 TriAd ring done in [place]    Allergies  Allergen Reactions   Codeine Other (See Comments)    "just don't take it well"   Gabapentin     dizziness   Molds & Smuts    Pollen Extract Swelling   Bee Venom Swelling and Rash    Outpatient Encounter  Medications as of 02/08/2021  Medication Sig   acetaminophen (TYLENOL) 325 MG tablet Take 2 tablets (650 mg total) by mouth every 6 (six) hours as needed for mild pain or fever.   amoxicillin (AMOXIL) 500 MG capsule Take 500 mg by mouth. Before dental visits every 6 months   apixaban (ELIQUIS) 2.5 MG TABS tablet Take 1 tablet (2.5 mg total) by mouth 2 (two) times daily.   atorvastatin (LIPITOR) 20 MG tablet TAKE 1 TABLET DAILY   Cholecalciferol (VITAMIN D) 50 MCG (2000 UT) CAPS Take 1 capsule by mouth daily.   digoxin (LANOXIN) 0.125 MG tablet TAKE 1 TABLET DAILY   ENBREL SURECLICK 50 MG/ML injection    Iron, Ferrous Sulfate, 325 (65 Fe) MG TABS Take 325 mg by mouth daily.   leflunomide (ARAVA) 20 MG tablet Take 20 mg by mouth daily.   levothyroxine (SYNTHROID) 75 MCG tablet Take one tablet by mouth once daily 30 minutes before breakfast on empty stomach.   metoprolol succinate (TOPROL-XL) 50 MG 24 hr tablet TAKE 1 TABLET DAILY   mirtazapine (REMERON) 15 MG tablet Take 1 tablet (15 mg total) by mouth at bedtime.   MULTIPLE VITAMIN PO Take by mouth.   Multiple Vitamins-Minerals (PRESERVISION AREDS 2) CAPS Take 2 Doses by mouth daily at 6 (six) AM. For eye health   omeprazole (PRILOSEC) 40 MG capsule TAKE 1 CAPSULE DAILY   Psyllium (METAMUCIL MULTIHEALTH FIBER) 63 % POWD Take 1 Scoop by mouth in the morning.   valsartan (DIOVAN) 80 MG tablet Take 1 tablet (80 mg total) by mouth daily.   vitamin B-12 (CYANOCOBALAMIN) 1000 MCG tablet Take 1,000 mcg by mouth daily.    No facility-administered encounter medications on file as of 02/08/2021.    Review of Systems:  Review of Systems  Constitutional:  Positive for activity change.  HENT: Negative.    Respiratory: Negative.    Cardiovascular: Negative.   Gastrointestinal: Negative.   Genitourinary:  Positive for frequency and urgency.  Musculoskeletal:  Positive for gait problem.  Skin: Negative.   Neurological:  Positive for dizziness.   Psychiatric/Behavioral:  Positive for confusion.    Health Maintenance  Topic Date Due   COVID-19 Vaccine (5 - Booster for Moderna series) 03/03/2021   TETANUS/TDAP  04/16/2027   Pneumonia Vaccine 71+ Years old  Completed   INFLUENZA VACCINE  Completed   DEXA SCAN  Completed   Zoster Vaccines- Shingrix  Completed   HPV VACCINES  Aged Out    Physical Exam: Vitals:   02/08/21 1005  BP: 114/78  Pulse: 89  Temp: 98 F (36.7 C)  SpO2: 96%  Weight: 120 lb 9.6 oz (54.7 kg)  Height: 5\' 2"  (1.575 m)   Body mass index is 22.06 kg/m. Physical Exam Constitutional:  Well-developed and well-nourished.  HENT:  Head: Normocephalic.  Mouth/Throat: Oropharynx is clear and moist.  Eyes: Pupils are equal, round, and reactive to light.  Neck: Neck supple.  Cardiovascular: Normal rate and normal heart sounds.  No murmur heard.Irregular  Pulmonary/Chest: Effort normal and breath sounds normal. No respiratory distress. No wheezes. She has no rales.  Abdominal: Soft. Bowel sounds are normal. No distension. There is no tenderness. There is no rebound.  Musculoskeletal: No edema. Changes of Arthritis in her hands Lymphadenopathy: none Neurological: Alert . Does get Dizzy when stands up but then gets better Walking well with the walker Skin: Skin is warm and dry.  Psychiatric: Normal mood and affect. Behavior is normal. Thought content normal.   Labs reviewed: Basic Metabolic Panel: Recent Labs    03/29/20 1537 04/05/20 0000 09/06/20 1127 09/07/20 0037 12/30/20 0000 02/02/21 0000  NA 137   < > 135 138 133* 137  K 4.4   < > 4.0 4.3 4.4 4.7  CL 104   < > 102 102 100 99  CO2  --    < > 25 26 23* 24*  GLUCOSE 103*  --  114* 96  --   --   BUN 23   < > 19 15 18 14   CREATININE 0.90   < > 0.75 0.85 0.8 0.8  CALCIUM  --    < > 9.2 9.1 9.0 9.5  TSH  --   --   --   --   --  2.11   < > = values in this interval not displayed.   Liver Function Tests: Recent Labs    03/29/20 0000  03/29/20 1525 04/05/20 0000 12/30/20 0000  AST  --  24 23 21   ALT  --  23 20 16   ALKPHOS  --  72 91 96  BILITOT  --  0.8  --   --   PROT  --  6.5  --   --   ALBUMIN 4.5 3.9  --  4.1   No results for input(s): LIPASE, AMYLASE in the last 8760 hours. No results for input(s): AMMONIA in the last 8760 hours. CBC: Recent Labs    03/29/20 1525 03/29/20 1537 09/06/20 1127 09/07/20 0037 12/30/20 0000 01/05/21 0000 01/16/21 1612 02/02/21 0000  WBC 6.9   < > 10.1 8.5   < > 6.1 7.1 5.7  NEUTROABS 3.1  --   --   --   --   --  3.4  --   HGB 10.9*   < > 10.3* 10.1*   < > 8.6* 8.5 Repeated and verified X2.* 9.5*  HCT 32.6*   < > 30.6* 30.4*   < > 26* 26.0* 29*  MCV 105.8*  --  103.0* 104.5*  --   --  100.6*  --   PLT 169   < > 172 188   < > 240 238.0 215   < > = values in this interval not displayed.   Lipid Panel: Recent Labs    02/02/21 0000  CHOL 115  HDL 48  LDLCALC 49  TRIG 91   Lab Results  Component Value Date   HGBA1C 5.5 01/12/2013    Procedures since last visit: No results found.  Assessment/Plan Iron deficiency anemia due to chronic blood loss Hgb has improved on Iron Possible Colonoscopy Per GI in few weeks Continue Iron Permanent atrial fibrillation (  HCC) On Eliquis, and Digoxin and Toprol  Overactive bladder Is not taking Mybetriq yet Does not want to try something new till Colonoscopy  Mild cognitive impairment with memory loss Follows with neurology.  Has failed Aricept before Per their notes most likely vascular dementia.  Continue statin and blood pressure control Rheumatoid arthritis involving hand with negative rheumatoid factor, unspecified laterality ([place]) Follows with Dr. Amil Amen  On Jolee Ewing  Mixed hyperlipidemia Continue statin LDL good levels Unsteady gait/ Dizziness Check orthostatic next visit.  Blood pressure little on the lower side but do not want to change anything as she is going for the procedure.  Essential hypertension On  losartan and Toprol Will check orthostatics next visit consider lowering the dose due to her dizziness Depression On Remeron Hypothyroidism TSH normal  Labs/tests ordered:  * No order type specified * Next appt:  Visit date not found

## 2021-02-08 NOTE — Telephone Encounter (Signed)
Pt daughter called no answer left a voice mail for pt daughter to call back  when she calls back let her know She can do a video visit or be put on the waitlist.  Almost everyone on the waitlist gets moved up

## 2021-02-08 NOTE — Telephone Encounter (Signed)
Inbound call from daughter Hosie Spangle. States patient had labs on 11/10. Asked if they can be reviewed and will the procedure 11/30 is needed. Best contact number [phone]

## 2021-02-08 NOTE — Telephone Encounter (Signed)
Kathy Velez would like a call back from [place]. He mother has an appt in [place], and she is not able to make it with her. She wanted to resch but the first avail appt is June, she doesn't think she should wait that long. She would like to see if jaffe thinks its okay to wait till June for an appt.  [phone]

## 2021-02-08 NOTE — Telephone Encounter (Signed)
Dr. Fuller Plan, please see the labs from 11/10.  Do your recommendations for endo/colon remain?

## 2021-02-09 ENCOUNTER — Encounter: Payer: Medicare Other | Admitting: Orthopedic Surgery

## 2021-02-09 DIAGNOSIS — Z8673 Personal history of transient ischemic attack (TIA), and cerebral infarction without residual deficits: Secondary | ICD-10-CM | POA: Diagnosis not present

## 2021-02-09 DIAGNOSIS — G3184 Mild cognitive impairment, so stated: Secondary | ICD-10-CM | POA: Diagnosis not present

## 2021-02-09 DIAGNOSIS — R41841 Cognitive communication deficit: Secondary | ICD-10-CM | POA: Diagnosis not present

## 2021-02-09 NOTE — Telephone Encounter (Signed)
Left message for patient to call back  

## 2021-02-09 NOTE — Telephone Encounter (Signed)
Patient's daughter Hosie Spangle notified.  She will keep the procedures as scheduled.

## 2021-02-09 NOTE — Telephone Encounter (Signed)
Blood work from 11/10 reviewed. Hgb has increased to 9.5 and iron studies have improved on FeSO4. Good news that she is responding to Fe however the reason she developed occult blood in stool, IDA is unknown. In addition she has a history of colon cancer. As in my 10/24 note colonoscopy and EGD are indicated, CT [facility] could provide some information or they can choose to not proceed with any further tests or procedures. Those are the options and all are reasonable. It is their decision.

## 2021-02-10 NOTE — Telephone Encounter (Signed)
Pt daughter called no answer left a voice mail to call the office back  

## 2021-02-10 NOTE — Telephone Encounter (Signed)
Per Kathy Velez pt daughter called back in and  was advised Kathy Velez can do a video visit or be put on the waitlist.  Almost everyone on the waitlist gets moved up

## 2021-02-16 ENCOUNTER — Other Ambulatory Visit: Payer: Self-pay | Admitting: Internal Medicine

## 2021-02-16 DIAGNOSIS — F5101 Primary insomnia: Secondary | ICD-10-CM

## 2021-02-22 ENCOUNTER — Other Ambulatory Visit: Payer: Self-pay

## 2021-02-22 ENCOUNTER — Encounter: Payer: Self-pay | Admitting: Gastroenterology

## 2021-02-22 ENCOUNTER — Ambulatory Visit (AMBULATORY_SURGERY_CENTER): Payer: Medicare Other | Admitting: Gastroenterology

## 2021-02-22 VITALS — BP 145/88 | HR 79 | Temp 97.7°F | Resp 22 | Ht 62.0 in | Wt 123.0 lb

## 2021-02-22 DIAGNOSIS — K225 Diverticulum of esophagus, acquired: Secondary | ICD-10-CM | POA: Diagnosis not present

## 2021-02-22 DIAGNOSIS — Z85038 Personal history of other malignant neoplasm of large intestine: Secondary | ICD-10-CM

## 2021-02-22 DIAGNOSIS — K6389 Other specified diseases of intestine: Secondary | ICD-10-CM

## 2021-02-22 DIAGNOSIS — D122 Benign neoplasm of ascending colon: Secondary | ICD-10-CM | POA: Diagnosis not present

## 2021-02-22 DIAGNOSIS — C185 Malignant neoplasm of splenic flexure: Secondary | ICD-10-CM | POA: Diagnosis not present

## 2021-02-22 DIAGNOSIS — K640 First degree hemorrhoids: Secondary | ICD-10-CM

## 2021-02-22 DIAGNOSIS — K222 Esophageal obstruction: Secondary | ICD-10-CM

## 2021-02-22 DIAGNOSIS — K635 Polyp of colon: Secondary | ICD-10-CM | POA: Diagnosis not present

## 2021-02-22 DIAGNOSIS — R195 Other fecal abnormalities: Secondary | ICD-10-CM

## 2021-02-22 DIAGNOSIS — I10 Essential (primary) hypertension: Secondary | ICD-10-CM | POA: Diagnosis not present

## 2021-02-22 DIAGNOSIS — K449 Diaphragmatic hernia without obstruction or gangrene: Secondary | ICD-10-CM

## 2021-02-22 DIAGNOSIS — D123 Benign neoplasm of transverse colon: Secondary | ICD-10-CM

## 2021-02-22 DIAGNOSIS — D500 Iron deficiency anemia secondary to blood loss (chronic): Secondary | ICD-10-CM

## 2021-02-22 DIAGNOSIS — I4891 Unspecified atrial fibrillation: Secondary | ICD-10-CM | POA: Diagnosis not present

## 2021-02-22 MED ORDER — SODIUM CHLORIDE 0.9 % IV SOLN: 500.0000 mL | Freq: Once | INTRAVENOUS | Status: DC

## 2021-02-22 NOTE — Progress Notes (Signed)
VS completed by DT.  Medical and surgical history reviewed and updated with patient and daughter.

## 2021-02-22 NOTE — Op Note (Signed)
[facility] Patient Name: Kathy Velez Procedure Date: 02/22/2021 2:57 PM MRN: [id number] Endoscopist: Ladene Artist , MD Age: 87 Referring MD:  Date of Birth: Nov 14, 1931 Gender: Female Account #: [id number] Procedure:                Colonoscopy Indications:              Heme positive stool, Unexplained iron deficiency                            anemia, Personal history of colon cancer. Medicines:                Monitored Anesthesia Care Procedure:                Pre-Anesthesia Assessment:                           - Prior to the procedure, a History and Physical                            was performed, and patient medications and                            allergies were reviewed. The patient's tolerance of                            previous anesthesia was also reviewed. The risks                            and benefits of the procedure and the sedation                            options and risks were discussed with the patient.                            All questions were answered, and informed consent                            was obtained. Prior Anticoagulants: The patient has                            taken Eliquis (apixaban), last dose was 2 days                            prior to procedure. ASA Grade Assessment: III - A                            patient with severe systemic disease. After                            reviewing the risks and benefits, the patient was                            deemed in satisfactory condition to undergo the  procedure.                           After obtaining informed consent, the colonoscope                            was passed under direct vision. Throughout the                            procedure, the patient's blood pressure, pulse, and                            oxygen saturations were monitored continuously. The                            Olympus PCF-H190DL (#[id number]) Colonoscope was                             introduced through the anus and advanced to the the                            ileocolonic anastomosis. The rectum was                            photographed. The quality of the bowel preparation                            was adequate after extensive lavage, suction. The                            colonoscopy was somewhat difficult due to                            restricted mobility of the colon, significant                            looping and a tortuous colon. The patient tolerated                            the procedure well. Scope In: 3:07:13 PM Scope Out: 3:41:06 PM Scope Withdrawal Time: 0 hours 25 minutes 5 seconds  Total Procedure Duration: 0 hours 33 minutes 53 seconds  Findings:                 The perianal and digital rectal examinations were                            normal.                           There was evidence of a prior end-to-end                            ileo-colonic anastomosis in the transverse colon.  This was patent and was characterized by healthy                            appearing mucosa. The anastomosis was traversed.                           A 25 mm polyp was found in the transverse colon.                            The polyp was sessile. The polyp was removed with a                            piecemeal technique using a cold snare. Resection                            and retrieval were complete. Areas 5 cm distal to                            the polyp was tattooed with an injection of 3 mL of                            Spot (carbon black).                           A fungating partially obstructing medium-sized mass                            was found at the splenic flexure. The mass was                            partially circumferential (involving one-half of                            the lumen circumference). The mass measured two cm                            in length. In addition, its  diameter measured three                            cm. No bleeding was present. Biopsies were taken                            with a cold forceps for histology. Areas 5 cm                            distal to the mass was tattooed with an injection                            of 3 mL of Spot (carbon black).                           Multiple medium-mouthed diverticula were found in  the left colon. There was narrowing of the colon in                            association with the diverticular opening. There                            was evidence of diverticular spasm. There was no                            evidence of diverticular bleeding.                           Internal hemorrhoids were found during                            retroflexion. The hemorrhoids were small and Grade                            I (internal hemorrhoids that do not prolapse).                           The exam was otherwise without abnormality on                            direct and retroflexion views. Complications:            No immediate complications. Estimated blood loss:                            None. Estimated Blood Loss:     Estimated blood loss: none. Impression:               - Patent end-to-end ileo-colonic anastomosis,                            characterized by healthy appearing mucosa.                           - One 25 mm polyp in the transverse colon, removed                            piecemeal using a cold snare. Resected and                            retrieved. Tattooed.                           - Malignant partially obstructing tumor at the                            splenic flexure. Biopsied. Tattooed.                           - Moderate diverticulosis in the left colon.                           -  Internal hemorrhoids.                           - The examination was otherwise normal on direct                            and retroflexion  views. Recommendation:           - Repeat colonoscopy, vs no repeat colonoscopy due                            to age, date to be determined after pending                            pathology results are reviewed for surveillance                            based on pathology results.                           - Resume Eliquis (apixaban) in 3 days at prior                            dose. Refer to managing physician for further                            adjustment of therapy.                           - Patient has a contact number available for                            emergencies. The signs and symptoms of potential                            delayed complications were discussed with the                            patient. Return to normal activities tomorrow.                            Written discharge instructions were provided to the                            patient.                           - Resume previous diet.                           - Continue present medications.                           - Await pathology results.                           - No aspirin, ibuprofen,  naproxen, or other                            non-steroidal anti-inflammatory drugs for 3 weeks                            after polyp removal.                           - Discuss CT AP and referral to a colorectal                            surgeon with the patient and family. Ladene Artist, MD 02/22/2021 3:58:58 PM This report has been signed electronically.

## 2021-02-22 NOTE — Progress Notes (Signed)
Report to PACU, RN, vss, BBS= Clear.  

## 2021-02-22 NOTE — Progress Notes (Signed)
History & Physical  Primary Care Physician:  Virgie Dad, MD Primary Gastroenterologist: Lucio Edward, MD  CHIEF COMPLAINT:  IDA, Hemoccult positive stool, personal history of colon cancer  HPI: Kathy Velez is a 87 y.o. female with iron deficiency anemia, Hemoccult positive stool, personal history of colon cancer for colonoscopy and EGD.  Eliquis held for 2 days prior to procedures..    Past Medical History:  Diagnosis Date   Anemia    Anxiety    Arthritis    Back   Atrial fibrillation ([facility])    Cancer ([facility]) 2006   Colon.  Basal Cell Skin cancer- right arm   Colon cancer ([facility])    Constipation due to pain medication therapy    after heart surgery   Dysrhythmia    PAF   GERD (gastroesophageal reflux disease)    Heart murmur    Hypertension    Hypothyroidism    RA (rheumatoid arthritis) ([facility])    Restless leg    Seizures ([facility])    after Heart Surgey   Stroke ([facility])    TIA- found by neurologist after     Past Surgical History:  Procedure Laterality Date   ABDOMINAL HYSTERECTOMY  1970   Partial    COLON RESECTION  2006   cancer   COLON SURGERY     COLONOSCOPY     EYE SURGERY Bilateral    Cataract   MAXIMUM ACCESS (MAS)POSTERIOR LUMBAR INTERBODY FUSION (PLIF) 2 LEVEL N/A 04/12/2015   Procedure: Lumbar Three-Five Decompression, Pedicle Screw Fixation, and Posteriolateral Arthrodesis;  Surgeon: Erline Levine, MD;  Location: [facility];  Service: Neurosurgery;  Laterality: N/A;  L3-4 L4-5 Maximum access posterior lumbar fusion, possible interbodies and resection of synovial cyst at L4-5   MITRAL VALVE REPAIR  01/20/2013   Gore-tex cords to P1, P2, and P3. Magic suture to posterior medial commisure, #30 Physio 1 ring. Done in [place]   TONSILLECTOMY     about [place]  01/20/2013   #28 TriAd ring done in [place]    Prior to Admission medications   Medication Sig Start Date End Date Taking? Authorizing Provider  atorvastatin (LIPITOR) 20 MG  tablet TAKE 1 TABLET DAILY 06/16/20  Yes Royal Hawthorn, NP  Cholecalciferol (VITAMIN D) 50 MCG (2000 UT) CAPS Take 1 capsule by mouth daily.   Yes [provider]  digoxin (LANOXIN) 0.125 MG tablet TAKE 1 TABLET DAILY 06/16/20  Yes Royal Hawthorn, NP  leflunomide (ARAVA) 20 MG tablet Take 20 mg by mouth daily. 10/05/19  Yes [provider]  levothyroxine (SYNTHROID) 75 MCG tablet Take one tablet by mouth once daily 30 minutes before breakfast on empty stomach. 10/07/20  Yes Virgie Dad, MD  metoprolol succinate (TOPROL-XL) 50 MG 24 hr tablet TAKE 1 TABLET DAILY 06/16/20  Yes Royal Hawthorn, NP  mirtazapine (REMERON) 15 MG tablet TAKE 1 TABLET AT BEDTIME 02/20/21  Yes Virgie Dad, MD  MULTIPLE VITAMIN PO Take by mouth.   Yes [provider]  Multiple Vitamins-Minerals (PRESERVISION AREDS 2) CAPS Take 2 Doses by mouth daily at 6 (six) AM. For eye health   Yes [provider]  omeprazole (PRILOSEC) 40 MG capsule TAKE 1 CAPSULE DAILY 10/10/20  Yes Virgie Dad, MD  valsartan (DIOVAN) 80 MG tablet Take 1 tablet (80 mg total) by mouth daily. 01/02/21  Yes Virgie Dad, MD  vitamin B-12 (CYANOCOBALAMIN) 1000 MCG tablet Take 1,000 mcg by mouth daily.  Yes [provider]  acetaminophen (TYLENOL) 325 MG tablet Take 2 tablets (650 mg total) by mouth every 6 (six) hours as needed for mild pain or fever. 09/08/20   Little Ishikawa, MD  amoxicillin (AMOXIL) 500 MG capsule Take 500 mg by mouth. Before dental visits every 6 months    [provider]  apixaban (ELIQUIS) 2.5 MG TABS tablet Take 1 tablet (2.5 mg total) by mouth 2 (two) times daily. 01/02/21   Virgie Dad, MD  ENBREL SURECLICK 50 MG/ML injection  03/06/18   [provider]  Iron, Ferrous Sulfate, 325 (65 Fe) MG TABS Take 325 mg by mouth daily. 01/03/21   Royal Hawthorn, NP  Psyllium (METAMUCIL MULTIHEALTH FIBER) 63 % POWD Take 1 Scoop by mouth in the morning. 06/08/20    Gayland Curry, DO    Current Outpatient Medications  Medication Sig Dispense Refill   atorvastatin (LIPITOR) 20 MG tablet TAKE 1 TABLET DAILY 90 tablet 3   Cholecalciferol (VITAMIN D) 50 MCG (2000 UT) CAPS Take 1 capsule by mouth daily.     digoxin (LANOXIN) 0.125 MG tablet TAKE 1 TABLET DAILY 90 tablet 3   leflunomide (ARAVA) 20 MG tablet Take 20 mg by mouth daily.     levothyroxine (SYNTHROID) 75 MCG tablet Take one tablet by mouth once daily 30 minutes before breakfast on empty stomach. 90 tablet 3   metoprolol succinate (TOPROL-XL) 50 MG 24 hr tablet TAKE 1 TABLET DAILY 90 tablet 3   mirtazapine (REMERON) 15 MG tablet TAKE 1 TABLET AT BEDTIME 90 tablet 3   MULTIPLE VITAMIN PO Take by mouth.     Multiple Vitamins-Minerals (PRESERVISION AREDS 2) CAPS Take 2 Doses by mouth daily at 6 (six) AM. For eye health     omeprazole (PRILOSEC) 40 MG capsule TAKE 1 CAPSULE DAILY 90 capsule 3   valsartan (DIOVAN) 80 MG tablet Take 1 tablet (80 mg total) by mouth daily. 90 tablet 3   vitamin B-12 (CYANOCOBALAMIN) 1000 MCG tablet Take 1,000 mcg by mouth daily.      acetaminophen (TYLENOL) 325 MG tablet Take 2 tablets (650 mg total) by mouth every 6 (six) hours as needed for mild pain or fever. 30 tablet 0   amoxicillin (AMOXIL) 500 MG capsule Take 500 mg by mouth. Before dental visits every 6 months     apixaban (ELIQUIS) 2.5 MG TABS tablet Take 1 tablet (2.5 mg total) by mouth 2 (two) times daily. 180 tablet 3   ENBREL SURECLICK 50 MG/ML injection      Iron, Ferrous Sulfate, 325 (65 Fe) MG TABS Take 325 mg by mouth daily. 30 tablet 3   Psyllium (METAMUCIL MULTIHEALTH FIBER) 63 % POWD Take 1 Scoop by mouth in the morning. 660 g 0   Current Facility-Administered Medications  Medication Dose Route Frequency Provider Last Rate Last Admin   0.9 %  sodium chloride infusion  500 mL Intravenous Once Ladene Artist, MD        Allergies as of 02/22/2021 - Review Complete 02/22/2021  Allergen Reaction  Noted   Codeine Other (See Comments) 03/30/2015   Gabapentin  09/12/2020   Molds & smuts  07/02/2011   Pollen extract Swelling 10/07/2017   Bee venom Swelling and Rash 03/30/2015    Family History  Problem Relation Age of Onset   Lung disease Mother 22   Heart disease Father 49   Alcohol abuse Son    Colon cancer Neg Hx    Stomach cancer  Neg Hx    Rectal cancer Neg Hx     Social History   Socioeconomic History   Marital status: Widowed    Spouse name: Not on file   Number of children: Not on file   Years of education: Not on file   Highest education level: Not on file  Occupational History   Occupation: elementary school teacher    Comment: retired   Tobacco Use   Smoking status: Never   Smokeless tobacco: Never  Vaping Use   Vaping Use: Never used  Substance and Sexual Activity   Alcohol use: Yes    Alcohol/week: 1.0 standard drink    Types: 1 Glasses of wine per week    Comment: 1-2 per week   Drug use: No   Sexual activity: Not Currently  Other Topics Concern   Not on file  Social History Narrative   Social History      Diet? Healthy- low salt, sugar, fat      Do you drink/eat things with caffeine? On occasion      Marital status?        widow                        What year were you married? 1958      Do you live in a house, apartment, assisted living, condo, trailer, etc.? apartment      Is it one or more stories? one      How many persons live in your home? one      Do you have any pets in your home? (please list) no      Highest level of education completed? 4 year college      Current or past profession: Statistician      Do you exercise?             yes                         Type & how often? Classes- senior retirement community/ some walking      Advanced Directives      Do you have a living will?      Do you have a DNR form?                                  If not, do you want to discuss one?      Do you have signed POA/HPOA  for forms?       Functional Status Completed by: Daughter, Di Kindle      Do you have difficulty bathing or dressing yourself? no      Do you have difficulty preparing food or eating? no      Do you have difficulty managing your medications? no      Do you have difficulty managing your finances? no      Do you have difficulty affording your medications? no   Right handed   Social Determinants of Health   Financial Resource Strain: Not on file  Food Insecurity: Not on file  Transportation Needs: Not on file  Physical Activity: Not on file  Stress: Not on file  Social Connections: Not on file  Intimate Partner Violence: Not on file    Review of Systems:  All systems reviewed an negative except where noted in HPI.  Gen: Denies any fever,  chills, sweats, anorexia, fatigue, weakness, malaise, weight loss, and sleep disorder CV: Denies chest pain, angina, palpitations, syncope, orthopnea, PND, peripheral edema, and claudication. Resp: Denies dyspnea at rest, dyspnea with exercise, cough, sputum, wheezing, coughing up blood, and pleurisy. GI: Denies vomiting blood, jaundice, and fecal incontinence.   Denies dysphagia or odynophagia. GU : Denies urinary burning, blood in urine, urinary frequency, urinary hesitancy, nocturnal urination, and urinary incontinence. MS: Denies joint pain, limitation of movement, and swelling, stiffness, low back pain, extremity pain. Denies muscle weakness, cramps, atrophy.  Derm: Denies rash, itching, dry skin, hives, moles, warts, or unhealing ulcers.  Psych: Denies depression, anxiety, memory loss, suicidal ideation, hallucinations, paranoia, and confusion. Heme: Denies bruising, bleeding, and enlarged lymph nodes. Neuro:  Denies any headaches, dizziness, paresthesias. Endo:  Denies any problems with DM, thyroid, adrenal function.   Physical Exam: General:  Alert, well-developed, in NAD Head:  Normocephalic and atraumatic. Eyes:  Sclera  clear, no icterus.   Conjunctiva pink. Ears:  Normal auditory acuity. Mouth:  No deformity or lesions.  Neck:  Supple; no masses . Lungs:  Clear throughout to auscultation.   No wheezes, crackles, or rhonchi. No acute distress. Heart:  Regular rate and rhythm; no murmurs. Abdomen:  Soft, nondistended, nontender. No masses, hepatomegaly. No obvious masses.  Normal bowel .    Rectal:  Deferred   Msk:  Symmetrical without gross deformities.. Pulses:  Normal pulses noted. Extremities:  Without edema. Neurologic:  Alert and  oriented x4;  grossly normal neurologically. Skin:  Intact without significant lesions or rashes. Cervical Nodes:  No significant cervical adenopathy. Psych:  Alert and cooperative. Normal mood and affect.   Impression / Plan:   Iron deficiency anemia, Hemoccult positive stool, personal history of colon cancer for colonoscopy and EGD.  Eliquis held for 2 days prior to procedures.   Pricilla Riffle. Fuller Plan  02/22/2021, 3:04 PM See Shea Evans, [facility], to contact our on call provider

## 2021-02-22 NOTE — Progress Notes (Signed)
Called to room to assist during endoscopic procedure.  Patient ID and intended procedure confirmed with present staff. Received instructions for my participation in the procedure from the performing physician.  

## 2021-02-22 NOTE — Patient Instructions (Addendum)
Resume Eliquis in 3 days Hold NSAID medications for 3 weeks (Advil, Alive,Aspirin,Ibuprofen)  YOU HAD AN ENDOSCOPIC PROCEDURE TODAY: Refer to the procedure report and other information in the discharge instructions given to you for any specific questions about what was found during the examination. If this information does not answer your questions, please call [facility] office at [phone] to clarify.   YOU SHOULD EXPECT: Some feelings of bloating in the abdomen. Passage of more gas than usual. Walking can help get rid of the air that was put into your GI tract during the procedure and reduce the bloating. If you had a lower endoscopy (such as a colonoscopy or flexible sigmoidoscopy) you may notice spotting of blood in your stool or on the toilet paper. Some abdominal soreness may be present for a day or two, also.  DIET: Your first meal following the procedure should be a light meal and then it is ok to progress to your normal diet. A half-sandwich or bowl of soup is an example of a good first meal. Heavy or fried foods are harder to digest and may make you feel nauseous or bloated. Drink plenty of fluids but you should avoid alcoholic beverages for 24 hours. If you had a esophageal dilation, please see attached instructions for diet.    ACTIVITY: Your care partner should take you home directly after the procedure. You should plan to take it easy, moving slowly for the rest of the day. You can resume normal activity the day after the procedure however YOU SHOULD NOT DRIVE, use power tools, machinery or perform tasks that involve climbing or major physical exertion for 24 hours (because of the sedation medicines used during the test).   SYMPTOMS TO REPORT IMMEDIATELY: A gastroenterologist can be reached at any hour. Please call [phone]  for any of the following symptoms:  Following lower endoscopy (colonoscopy, flexible sigmoidoscopy) Excessive amounts of blood in the stool  Significant  tenderness, worsening of abdominal pains  Swelling of the abdomen that is new, acute  Fever of 100 or higher  Following upper endoscopy (EGD, EUS, ERCP, esophageal dilation) Vomiting of blood or coffee ground material  New, significant abdominal pain  New, significant chest pain or pain under the shoulder blades  Painful or persistently difficult swallowing  New shortness of breath  Black, tarry-looking or red, bloody stools  FOLLOW UP:  If any biopsies were taken you will be contacted by phone or by letter within the next 1-3 weeks. Call [phone]  if you have not heard about the biopsies in 3 weeks.  Please also call with any specific questions about appointments or follow up tests.

## 2021-02-22 NOTE — Op Note (Signed)
[name] Procedure Date: 02/22/2021 2:57 PM MRN: [id number] Endoscopist: Ladene Artist , MD Age: 87 Referring MD:  Date of Birth: 1931/08/30 Gender: Female Account #: [id number] Procedure:                Upper GI endoscopy Indications:              Unexplained iron deficiency anemia, Heme positive                            stool Medicines:                Monitored Anesthesia Care Procedure:                Pre-Anesthesia Assessment:                           - Prior to the procedure, a History and Physical                            was performed, and patient medications and                            allergies were reviewed. The patient's tolerance of                            previous anesthesia was also reviewed. The risks                            and benefits of the procedure and the sedation                            options and risks were discussed with the patient.                            All questions were answered, and informed consent                            was obtained. Prior Anticoagulants: The patient has                            taken Eliquis (apixaban), last dose was 2 days                            prior to procedure. ASA Grade Assessment: III - A                            patient with severe systemic disease. After                            reviewing the risks and benefits, the patient was                            deemed in satisfactory condition to undergo the  procedure.                           After obtaining informed consent, the endoscope was                            passed under direct vision. Throughout the                            procedure, the patient's blood pressure, pulse, and                            oxygen saturations were monitored continuously. The                            GIF [id number] #[id number] was introduced through the                            mouth, and  advanced to the second part of duodenum.                            The upper GI endoscopy was accomplished without                            difficulty. The patient tolerated the procedure                            well. Scope In: Scope Out: Findings:                 One benign-appearing, intrinsic mild stenosis was                            found 37 cm from the incisors. This stenosis                            measured 1.5 cm (inner diameter) x less than one cm                            (in length). The stenosis was traversed.                           The exam of the esophagus was otherwise normal.                           A small hiatal hernia was present.                           Patchy mildly erythematous mucosa without bleeding                            was found in the gastric body and in the gastric                            antrum. Biopsies were taken with a  cold forceps for                            histology.                           The exam of the stomach was otherwise normal.                           A small non-bleeding diverticulum was found in the                            second portion of the duodenum.                           The exam of the duodenum was otherwise normal. Complications:            No immediate complications. Estimated blood loss:                            None. Estimated Blood Loss:     Estimated blood loss was minimal. Impression:               - Benign-appearing esophageal stenosis.                           - Small hiatal hernia.                           - Erythematous mucosa in the gastric body and                            antrum. Biopsied.                           - Small duodenal diverticulum. Recommendation:           - Patient has a contact number available for                            emergencies. The signs and symptoms of potential                            delayed complications were discussed with the                             patient. Return to normal activities tomorrow.                            Written discharge instructions were provided to the                            patient.                           - Resume previous diet.                           -  Continue present medications.                           - Await pathology results.                           - Resume Eliquis (apixaban) at prior dose in 3                            days. Refer to managing physician for further                            adjustment of therapy. Ladene Artist, MD 02/22/2021 4:05:02 PM This report has been signed electronically.

## 2021-02-23 ENCOUNTER — Other Ambulatory Visit: Payer: Self-pay

## 2021-02-23 ENCOUNTER — Telehealth: Payer: Self-pay | Admitting: *Deleted

## 2021-02-23 ENCOUNTER — Encounter: Payer: Self-pay | Admitting: Internal Medicine

## 2021-02-23 NOTE — Telephone Encounter (Signed)
  Follow up Call-  Call back number 02/22/2021  Post procedure Call Back phone  # [phone]  Permission to leave phone message Yes  Some recent data might be hidden     Patient questions:  Do you have a fever, pain , or abdominal swelling? No. Pain Score  0 *  Have you tolerated food without any problems? Yes.    Have you been able to return to your normal activities? Yes.    Do you have any questions about your discharge instructions: Diet   No. Medications  No. Follow up visit  No.  Do you have questions or concerns about your Care? No.  Actions: * If pain score is 4 or above: No action needed, pain <4.  Have you developed a fever since your procedure? no  2.   Have you had an respiratory symptoms (SOB or cough) since your procedure? no  3.   Have you tested positive for COVID 19 since your procedure no  4.   Have you had any family members/close contacts diagnosed with the COVID 19 since your procedure?  no   If yes to any of these questions please route to Joylene John, RN and Joella Prince, RN

## 2021-02-27 DIAGNOSIS — R41841 Cognitive communication deficit: Secondary | ICD-10-CM | POA: Diagnosis not present

## 2021-02-27 DIAGNOSIS — R1313 Dysphagia, pharyngeal phase: Secondary | ICD-10-CM | POA: Diagnosis not present

## 2021-02-27 DIAGNOSIS — Z8673 Personal history of transient ischemic attack (TIA), and cerebral infarction without residual deficits: Secondary | ICD-10-CM | POA: Diagnosis not present

## 2021-02-27 DIAGNOSIS — G3184 Mild cognitive impairment, so stated: Secondary | ICD-10-CM | POA: Diagnosis not present

## 2021-02-28 ENCOUNTER — Other Ambulatory Visit: Payer: Self-pay

## 2021-02-28 DIAGNOSIS — K6389 Other specified diseases of intestine: Secondary | ICD-10-CM

## 2021-02-28 DIAGNOSIS — D500 Iron deficiency anemia secondary to blood loss (chronic): Secondary | ICD-10-CM

## 2021-02-28 DIAGNOSIS — C801 Malignant (primary) neoplasm, unspecified: Secondary | ICD-10-CM

## 2021-02-28 NOTE — Progress Notes (Signed)
Ct chest

## 2021-03-01 DIAGNOSIS — G3184 Mild cognitive impairment, so stated: Secondary | ICD-10-CM | POA: Diagnosis not present

## 2021-03-01 DIAGNOSIS — Z8673 Personal history of transient ischemic attack (TIA), and cerebral infarction without residual deficits: Secondary | ICD-10-CM | POA: Diagnosis not present

## 2021-03-01 DIAGNOSIS — R1313 Dysphagia, pharyngeal phase: Secondary | ICD-10-CM | POA: Diagnosis not present

## 2021-03-01 DIAGNOSIS — R41841 Cognitive communication deficit: Secondary | ICD-10-CM | POA: Diagnosis not present

## 2021-03-03 DIAGNOSIS — R350 Frequency of micturition: Secondary | ICD-10-CM | POA: Diagnosis not present

## 2021-03-03 DIAGNOSIS — N3941 Urge incontinence: Secondary | ICD-10-CM | POA: Diagnosis not present

## 2021-03-06 ENCOUNTER — Encounter: Payer: Self-pay | Admitting: Adult Health

## 2021-03-06 ENCOUNTER — Non-Acute Institutional Stay: Payer: Medicare Other | Admitting: Adult Health

## 2021-03-06 ENCOUNTER — Other Ambulatory Visit: Payer: Self-pay

## 2021-03-06 VITALS — BP 126/78 | HR 80 | Temp 97.5°F | Ht 62.0 in | Wt 123.2 lb

## 2021-03-06 DIAGNOSIS — R051 Acute cough: Secondary | ICD-10-CM | POA: Diagnosis not present

## 2021-03-06 DIAGNOSIS — R0602 Shortness of breath: Secondary | ICD-10-CM

## 2021-03-06 MED ORDER — ALBUTEROL SULFATE HFA 108 (90 BASE) MCG/ACT IN AERS: 2.0000 | INHALATION_SPRAY | RESPIRATORY_TRACT | 0 refills | Status: DC | PRN

## 2021-03-06 MED ORDER — DOXYCYCLINE HYCLATE 100 MG PO TABS: 100.0000 mg | ORAL_TABLET | Freq: Two times a day (BID) | ORAL | 0 refills | Status: DC

## 2021-03-06 NOTE — Progress Notes (Signed)
[name]  POS:  clinic  Provider:  Cindi Carbon, [place] [phone]   Code Status:  Goals of Care:  Advanced Directives 02/08/2021  Does Patient Have a Medical Advance Directive? Yes  Type of Advance Directive [place]  Does patient want to make changes to medical advance directive? No - Patient declined  Copy of [place] in Chart? Yes - validated most recent copy scanned in chart (See row information)  Would patient like information on creating a medical advance directive? -  Pre-existing out of facility DNR order (yellow form or pink MOST form) -     Chief Complaint  Patient presents with   Medical Management of Chronic Issues    Patient returns to the clinic CC: wheezing, coughing up yellowish mucus.     HPI: Patient is a 87 y.o. female seen today for an acute visit for cough, wheezing, and sob. Rapid covid negative. She has a cough with yellow sputum. No fever. No sore throat or body aches. Feels sob mildly with activity.  Symptoms present for at least a week. Denies facial pain. Has chronic thin drainage from nose. She is going out of town 12/18.    Past Medical History:  Diagnosis Date   Anemia    Anxiety    Arthritis    Back   Atrial fibrillation ([place])    Cancer ([place]) 2006   Colon.  Basal Cell Skin cancer- right arm   Colon cancer ([facility])    Constipation due to pain medication therapy    after heart surgery   Dysrhythmia    PAF   GERD (gastroesophageal reflux disease)    Heart murmur    Hypertension    Hypothyroidism    RA (rheumatoid arthritis) ([place])    Restless leg    Seizures ([place])    after Heart Surgey   Stroke ([place])    TIA- found by neurologist after     Past Surgical History:  Procedure Laterality Date   ABDOMINAL HYSTERECTOMY  1970   Partial    COLON RESECTION  2006   cancer   COLON SURGERY     COLONOSCOPY     EYE SURGERY Bilateral    Cataract   MAXIMUM ACCESS  (MAS)POSTERIOR LUMBAR INTERBODY FUSION (PLIF) 2 LEVEL N/A 04/12/2015   Procedure: Lumbar Three-Five Decompression, Pedicle Screw Fixation, and Posteriolateral Arthrodesis;  Surgeon: Erline Levine, MD;  Location: [facility];  Service: Neurosurgery;  Laterality: N/A;  L3-4 L4-5 Maximum access posterior lumbar fusion, possible interbodies and resection of synovial cyst at L4-5   MITRAL VALVE REPAIR  01/20/2013   Gore-tex cords to P1, P2, and P3. Magic suture to posterior medial commisure, #30 Physio 1 ring. Done in [place]   TONSILLECTOMY     about [place]  01/20/2013   #28 TriAd ring done in [place]    Allergies  Allergen Reactions   Codeine Other (See Comments)    "just don't take it well"   Gabapentin     dizziness   Molds & Smuts    Pollen Extract Swelling   Bee Venom Swelling and Rash    Outpatient Encounter Medications as of 03/06/2021  Medication Sig   acetaminophen (TYLENOL) 325 MG tablet Take 2 tablets (650 mg total) by mouth every 6 (six) hours as needed for mild pain or fever.   albuterol (VENTOLIN HFA) 108 (90 Base) MCG/ACT inhaler Inhale 2 puffs into the lungs every 4 (  four) hours as needed for wheezing or shortness of breath.   amoxicillin (AMOXIL) 500 MG capsule Take 500 mg by mouth. Before dental visits every 6 months   apixaban (ELIQUIS) 2.5 MG TABS tablet Take 1 tablet (2.5 mg total) by mouth 2 (two) times daily.   atorvastatin (LIPITOR) 20 MG tablet TAKE 1 TABLET DAILY   Cholecalciferol (VITAMIN D) 50 MCG (2000 UT) CAPS Take 1 capsule by mouth daily.   digoxin (LANOXIN) 0.125 MG tablet TAKE 1 TABLET DAILY   doxycycline (VIBRA-TABS) 100 MG tablet Take 1 tablet (100 mg total) by mouth 2 (two) times daily.   ENBREL SURECLICK 50 MG/ML injection    Iron, Ferrous Sulfate, 325 (65 Fe) MG TABS Take 325 mg by mouth daily.   leflunomide (ARAVA) 20 MG tablet Take 20 mg by mouth daily.   levothyroxine (SYNTHROID) 75 MCG tablet Take one tablet by mouth  once daily 30 minutes before breakfast on empty stomach.   metoprolol succinate (TOPROL-XL) 50 MG 24 hr tablet TAKE 1 TABLET DAILY   mirtazapine (REMERON) 15 MG tablet TAKE 1 TABLET AT BEDTIME   MULTIPLE VITAMIN PO Take by mouth.   Multiple Vitamins-Minerals (PRESERVISION AREDS 2) CAPS Take 2 Doses by mouth daily at 6 (six) AM. For eye health   omeprazole (PRILOSEC) 40 MG capsule TAKE 1 CAPSULE DAILY   Psyllium (METAMUCIL MULTIHEALTH FIBER) 63 % POWD Take 1 Scoop by mouth in the morning.   valsartan (DIOVAN) 80 MG tablet Take 1 tablet (80 mg total) by mouth daily.   Vibegron (GEMTESA PO) Take by mouth daily. Dose unknown   vitamin B-12 (CYANOCOBALAMIN) 1000 MCG tablet Take 1,000 mcg by mouth daily.    No facility-administered encounter medications on file as of 03/06/2021.    Review of Systems:  Review of Systems  Constitutional:  Negative for activity change, appetite change, chills, diaphoresis, fatigue, fever and unexpected weight change.  HENT:  Positive for congestion.   Respiratory:  Positive for cough and shortness of breath. Negative for wheezing.   Cardiovascular:  Negative for chest pain, palpitations and leg swelling.  Gastrointestinal:  Negative for abdominal distention, abdominal pain, constipation and diarrhea.  Genitourinary:  Negative for difficulty urinating and dysuria.  Musculoskeletal:  Positive for arthralgias and gait problem. Negative for back pain, joint swelling and myalgias.  Neurological:  Positive for dizziness (started today). Negative for tremors, seizures, syncope, facial asymmetry, speech difficulty, weakness, light-headedness, numbness and headaches.  Psychiatric/Behavioral:  Negative for agitation, behavioral problems and confusion.        Memory loss   Health Maintenance  Topic Date Due   COVID-19 Vaccine (5 - Booster for Moderna series) 03/03/2021   TETANUS/TDAP  04/16/2027   Pneumonia Vaccine 70+ Years old  Completed   INFLUENZA VACCINE  Completed    DEXA SCAN  Completed   Zoster Vaccines- Shingrix  Completed   HPV VACCINES  Aged Out    Physical Exam: Vitals:   03/06/21 1611  BP: 126/78  Pulse: 80  Temp: (!) 97.5 F (36.4 C)  SpO2: 97%  Weight: 123 lb 3.2 oz (55.9 kg)  Height: 5\' 2"  (1.575 m)   Body mass index is 22.53 kg/m. Physical Exam Vitals and nursing note reviewed.  Constitutional:      General: She is not in acute distress.    Appearance: She is not diaphoretic.  HENT:     Head: Normocephalic and atraumatic.     Right Ear: Tympanic membrane and ear canal normal.  Left Ear: Tympanic membrane and ear canal normal.     Nose: Congestion present.     Mouth/Throat:     Mouth: Mucous membranes are moist.     Pharynx: Oropharynx is clear. No oropharyngeal exudate.  Neck:     Vascular: No JVD.  Cardiovascular:     Rate and Rhythm: Normal rate. Rhythm irregular.     Heart sounds: No murmur heard. Pulmonary:     Effort: Pulmonary effort is normal. No respiratory distress.     Breath sounds: Normal breath sounds. No wheezing (mild bilateral lower lobes).  Abdominal:     General: Bowel sounds are normal.     Palpations: Abdomen is soft.  Skin:    General: Skin is warm and dry.  Neurological:     Mental Status: She is alert and oriented to person, place, and time.    Labs reviewed: Basic Metabolic Panel: Recent Labs    03/29/20 1537 04/05/20 0000 09/06/20 1127 09/07/20 0037 12/30/20 0000 02/02/21 0000  NA 137   < > 135 138 133* 137  K 4.4   < > 4.0 4.3 4.4 4.7  CL 104   < > 102 102 100 99  CO2  --    < > 25 26 23* 24*  GLUCOSE 103*  --  114* 96  --   --   BUN 23   < > 19 15 18 14   CREATININE 0.90   < > 0.75 0.85 0.8 0.8  CALCIUM  --    < > 9.2 9.1 9.0 9.5  TSH  --   --   --   --   --  2.11   < > = values in this interval not displayed.   Liver Function Tests: Recent Labs    03/29/20 0000 03/29/20 1525 04/05/20 0000 12/30/20 0000  AST  --  24 23 21   ALT  --  23 20 16   ALKPHOS  --  72 91  96  BILITOT  --  0.8  --   --   PROT  --  6.5  --   --   ALBUMIN 4.5 3.9  --  4.1   No results for input(s): LIPASE, AMYLASE in the last 8760 hours. No results for input(s): AMMONIA in the last 8760 hours. CBC: Recent Labs    03/29/20 1525 03/29/20 1537 09/06/20 1127 09/07/20 0037 12/30/20 0000 01/05/21 0000 01/16/21 1612 02/02/21 0000  WBC 6.9   < > 10.1 8.5   < > 6.1 7.1 5.7  NEUTROABS 3.1  --   --   --   --   --  3.4  --   HGB 10.9*   < > 10.3* 10.1*   < > 8.6* 8.5 Repeated and verified X2.* 9.5*  HCT 32.6*   < > 30.6* 30.4*   < > 26* 26.0* 29*  MCV 105.8*  --  103.0* 104.5*  --   --  100.6*  --   PLT 169   < > 172 188   < > 240 238.0 215   < > = values in this interval not displayed.   Lipid Panel: Recent Labs    02/02/21 0000  CHOL 115  HDL 48  LDLCALC 49  TRIG 91   Lab Results  Component Value Date   HGBA1C 5.5 01/12/2013    Procedures since last visit: No results found.  Assessment/Plan  1. Acute cough With associated mild wheeze, sob, sputum production, present for more than a week -  doxycycline (VIBRA-TABS) 100 MG tablet; Take 1 tablet (100 mg total) by mouth 2 (two) times daily.  Dispense: 14 tablet; Refill: 0  2. Shortness of breath Mild with mild wheeze on exp. Normal oxygen sats.  Ordered 2 view CXR - albuterol (VENTOLIN HFA) 108 (90 Base) MCG/ACT inhaler; Inhale 2 puffs into the lungs every 4 (four) hours as needed for wheezing or shortness of breath.  Dispense: 8 g; Refill: 0 Went over technique of synchronous motion with pressing down the dispenser and    Labs/tests ordered:  * No order type specified * CXR Next appt:  04/17/2021

## 2021-03-07 ENCOUNTER — Ambulatory Visit
Admission: RE | Admit: 2021-03-07 | Discharge: 2021-03-07 | Disposition: A | Discharge status: Home / Self Care | Payer: Medicare Other | Source: Ambulatory Visit | Attending: Adult Health | Admitting: Adult Health

## 2021-03-07 DIAGNOSIS — R0602 Shortness of breath: Secondary | ICD-10-CM

## 2021-03-07 NOTE — Progress Notes (Signed)
There is no evidence of pneumonia. She should complete the course of Doxycycline for bronchitis.

## 2021-03-09 ENCOUNTER — Ambulatory Visit: Payer: Medicare Other | Admitting: Physician Assistant

## 2021-03-09 NOTE — Progress Notes (Addendum)
NEUROLOGY FOLLOW UP OFFICE NOTE  Kathy Velez [id number]  Assessment/Plan:   Mild vascular neurocognitive disorder Atrial fibrillation History of TIAs and left frontal lobe infarct, likely cardioembolic Cerebral amyloid angiopathy Hypertension Hyperlipidemia  1.  Will try another cholinesterase inhibitor:  rivastigmine 1.5mg  twice daily for 4 weeks, then 3mg  twice daily 2.  Secondary stroke prevention as per PCP/cardiology: - Eliquis (discussed risk/benefits of taking anticoagulation in setting of both a fib and cerebral amyloid angiopathy) - Statin (LDL goal less than 70) - Normotensive blood pressure - follow up with PCP - Glycemic control (Hgb A1c goal less than 7) 3. Follow up 6 months with Sharene Butters, PA-C  Subjective:  Kathy Velez is an 87 year old right-handed white female with paroxysmal atrial fibrillation on Eliquis, HTN, hypothyroidism, interstitial lung disease, rheumatoid arthritis and history of stroke and colon cancer who follows up for [facility].     UPDATE: Current medications:  Eliquis; digoxin; atorvastatin 20mg ; Toprol XL; valsartan   She is in independent living in [place].  They have transportation available to take her shopping.  Her daughter sets up her pillbox for her.  Her balance is still an issue.  She had handle bars installed in the shower and at the toilet.  Using her walker in the apartment.  She exercises routinely. She takes a class 5 days a week.  She uses an exercise machine for a mile.  She notices slight worsening in short-term memory but not significant.  When she is out of her element, such as visiting with her family to their vacation house, she may be a little more confused.       HISTORY: On 04/20/2019, she presented to the [facility] ED after having word-finding difficulties and making paraphasic errors lasting a couple of hours.  She was slurring her words and wasn't able to answer basic questions.  No associated facial droop, numbness or  weakness.  She has some baseline diplopia but without change.  She had some cough but no fever or shortness of breath.  Labs were unremarkable, including CBC, CMP, TSH (2.309) and testing for flu and Covid.  CXR showed no active pulmonary disease.  EKG showed known atrial fibrillation but no acute ischemic cardiac event.  CT head without contrast showed chronic small vessel disease but no acute findings.  MRI of brain without contrast showed 2 punctate subacute infarcts in the left corona radiata and occipital pole subcortical white matter. Otherwise chronic small vessel ischemic changes with few scattered chronic microhemorrhages in the left occipital pole and central pons and small remote lacunar infarcts in right cerebellum.  No change was made to antiplatelet and anticoagulation therapy.  She is on ASA and Eliquis.  Several months prior, she had a severe left sided headache and noted vision loss on the left side of her vision while watching TV.  It lasted 2 hours.   She had another stroke in July 2021, while in the [facility], in which she had confusion, language difficulty and transient right facial droop.  She was transferred to [facility] on 09/27/2019.  MRI of brain showed an acute infarct in the left frontal corona radiata.  Again demonstrated multiple areas of remote microhemorrhages in the peripheral cerebral hemispheres as well as pons, suggesting hypertensive hemorrhage with underlying cerebral amyloid angiopathy.  CTA head and neck showed no large vessel intracranial occlusion or significant stenosis but did demonstrate atherosclerotic plaque in both ICAs causing less than 50% stenosis on  the right and 50% stenosis on the left.  Echocardiogram was negative for thrombus.  LDL 46.  HGB A1c 5.1.  Telemetry demonstrated atrial fibrillation.  Due to possible cerebral amyloid angiopathy, ASA was discontinued.  Since her stroke in July, she has had ongoing short-term memory  problems.   On 03/28/2020, she developed sudden onset of right sided numbness and tingling involving her face, tongue and hand lasting about 20 minutes and resolved.  No associated weakness.  The next day, she was was seen by the nurse at [facility] who advised her to go the ED for evaluation.  CT head personally reviewed showed no acute intracranial abnormality.  Due to recent work up previously performed and mild sensory symptoms that have since resolved, she was discharged in stable condition with no change in management   She reports prior history of TIA with right sided weakness.   Previous studies: 11/27/2018 ECHOCARDIOGRAM:  EF 60-65% with no cardiac source of emboli 02/03/2019 LABS:  LDL 58; B12 1,559 03/06/2019 CAROTID DOPPLER:  Moderate heterogeneous and calcified plaque with no hemodynamically significant stenosis   Past medication:  donepezil (leg cramps)  PAST MEDICAL HISTORY: Past Medical History:  Diagnosis Date   Anemia    Anxiety    Arthritis    Back   Atrial fibrillation ([place])    Cancer ([place]) 2006   Colon.  Basal Cell Skin cancer- right arm   Colon cancer ([facility])    Constipation due to pain medication therapy    after heart surgery   Dysrhythmia    PAF   GERD (gastroesophageal reflux disease)    Heart murmur    Hypertension    Hypothyroidism    RA (rheumatoid arthritis) ([facility])    Restless leg    Seizures ([place])    after Heart Surgey   Stroke ([place])    TIA- found by neurologist after     MEDICATIONS: Current Outpatient Medications on File Prior to Visit  Medication Sig Dispense Refill   acetaminophen (TYLENOL) 325 MG tablet Take 2 tablets (650 mg total) by mouth every 6 (six) hours as needed for mild pain or fever. 30 tablet 0   albuterol (VENTOLIN HFA) 108 (90 Base) MCG/ACT inhaler Inhale 2 puffs into the lungs every 4 (four) hours as needed for wheezing or shortness of breath. 8 g 0   amoxicillin (AMOXIL) 500 MG capsule Take 500 mg by mouth. Before dental  visits every 6 months     apixaban (ELIQUIS) 2.5 MG TABS tablet Take 1 tablet (2.5 mg total) by mouth 2 (two) times daily. 180 tablet 3   atorvastatin (LIPITOR) 20 MG tablet TAKE 1 TABLET DAILY 90 tablet 3   Cholecalciferol (VITAMIN D) 50 MCG (2000 UT) CAPS Take 1 capsule by mouth daily.     digoxin (LANOXIN) 0.125 MG tablet TAKE 1 TABLET DAILY 90 tablet 3   doxycycline (VIBRA-TABS) 100 MG tablet Take 1 tablet (100 mg total) by mouth 2 (two) times daily. 14 tablet 0   ENBREL SURECLICK 50 MG/ML injection      Iron, Ferrous Sulfate, 325 (65 Fe) MG TABS Take 325 mg by mouth daily. 30 tablet 3   leflunomide (ARAVA) 20 MG tablet Take 20 mg by mouth daily.     levothyroxine (SYNTHROID) 75 MCG tablet Take one tablet by mouth once daily 30 minutes before breakfast on empty stomach. 90 tablet 3   metoprolol succinate (TOPROL-XL) 50 MG 24 hr tablet TAKE 1 TABLET DAILY 90 tablet 3   mirtazapine (  REMERON) 15 MG tablet TAKE 1 TABLET AT BEDTIME 90 tablet 3   MULTIPLE VITAMIN PO Take by mouth.     Multiple Vitamins-Minerals (PRESERVISION AREDS 2) CAPS Take 2 Doses by mouth daily at 6 (six) AM. For eye health     omeprazole (PRILOSEC) 40 MG capsule TAKE 1 CAPSULE DAILY 90 capsule 3   Psyllium (METAMUCIL MULTIHEALTH FIBER) 63 % POWD Take 1 Scoop by mouth in the morning. 660 g 0   valsartan (DIOVAN) 80 MG tablet Take 1 tablet (80 mg total) by mouth daily. 90 tablet 3   Vibegron (GEMTESA PO) Take by mouth daily. Dose unknown     vitamin B-12 (CYANOCOBALAMIN) 1000 MCG tablet Take 1,000 mcg by mouth daily.      No current facility-administered medications on file prior to visit.    ALLERGIES: Allergies  Allergen Reactions   Codeine Other (See Comments)    "just don't take it well"   Gabapentin     dizziness   Molds & Smuts    Pollen Extract Swelling   Bee Venom Swelling and Rash    FAMILY HISTORY: Family History  Problem Relation Age of Onset   Lung disease Mother 65   Heart disease Father 78    Alcohol abuse Son    Colon cancer Neg Hx    Stomach cancer Neg Hx    Rectal cancer Neg Hx       Objective:  Blood pressure 137/63, pulse (!) 111, height 5\' 2"  (1.575 m), weight 121 lb (54.9 kg), SpO2 97 %. General: No acute distress.  Patient appears well-groomed.   Head:  Normocephalic/atraumatic Eyes:  Fundi examined but not visualized Neck: supple, no paraspinal tenderness, full range of motion Heart:  Regular rate, irregular rhythm Lungs:  Clear to auscultation bilaterally Back: No paraspinal tenderness Neurological Exam:  [employer] Mental Exam 03/01/2020 03/10/2021  Weekday Correct 1 1  Current year 1 1  What state are we in? 1 1  Amount spent 1 1  Amount left 2 2  # of Animals 2 2  5  objects recall 2 2  Number series 1 2  Hour markers 2 2  Time correct 0 2  Placed X in triangle correctly 1 1  Largest Figure 1 1  Name of female 2 2  Date back to work 2 0  Type of work 2 0  State she lived in 0 0  Total score 21 20    CN II-XII intact. Bulk and tone normal, muscle strength 5-/5 hip flexion otherwise 5/5 throughout.  Sensation to light touch intact.  Deep tendon reflexes absent throughout.  Finger to nose testing intact.  Cautious wide-based gait, ambulates with walker.    Metta Clines, DO  CC: Veleta Miners, MD

## 2021-03-10 ENCOUNTER — Ambulatory Visit (INDEPENDENT_AMBULATORY_CARE_PROVIDER_SITE_OTHER): Payer: Medicare Other | Admitting: Neurology

## 2021-03-10 ENCOUNTER — Encounter: Payer: Self-pay | Admitting: Neurology

## 2021-03-10 ENCOUNTER — Other Ambulatory Visit: Payer: Self-pay

## 2021-03-10 ENCOUNTER — Other Ambulatory Visit (HOSPITAL_COMMUNITY): Payer: Medicare Other

## 2021-03-10 VITALS — BP 137/63 | HR 111 | Ht 62.0 in | Wt 121.0 lb

## 2021-03-10 DIAGNOSIS — I4821 Permanent atrial fibrillation: Secondary | ICD-10-CM

## 2021-03-10 DIAGNOSIS — F01A0 Vascular dementia, mild, without behavioral disturbance, psychotic disturbance, mood disturbance, and anxiety: Secondary | ICD-10-CM | POA: Diagnosis not present

## 2021-03-10 DIAGNOSIS — I680 Cerebral amyloid angiopathy: Secondary | ICD-10-CM | POA: Diagnosis not present

## 2021-03-10 DIAGNOSIS — I10 Essential (primary) hypertension: Secondary | ICD-10-CM

## 2021-03-10 DIAGNOSIS — E782 Mixed hyperlipidemia: Secondary | ICD-10-CM

## 2021-03-10 MED ORDER — RIVASTIGMINE TARTRATE 1.5 MG PO CAPS
1.5000 mg | ORAL_CAPSULE | Freq: Two times a day (BID) | ORAL | 0 refills | Status: DC
Start: 2021-03-10 — End: 2021-05-29

## 2021-03-10 NOTE — Patient Instructions (Addendum)
We will try a different memory medicine: Take rivastigmine 1.5mg  twice daily.  In 4 weeks, contact me and I will increase dose to 3mg  twice daily 2.   Secondary stroke prevention as prescribed by PCP/cardiology: - Eliquis  - Statin (LDL goal less than 70) - Normotensive blood pressure - follow up with PCP - Glycemic control (Hgb A1c goal less than 7) 3.  Follow up with Sharene Butters, PA-C in 6 months.

## 2021-03-21 ENCOUNTER — Encounter: Payer: Self-pay | Admitting: Neurology

## 2021-03-22 ENCOUNTER — Other Ambulatory Visit: Payer: Self-pay | Admitting: Internal Medicine

## 2021-03-22 DIAGNOSIS — I4821 Permanent atrial fibrillation: Secondary | ICD-10-CM

## 2021-03-29 DIAGNOSIS — M0579 Rheumatoid arthritis with rheumatoid factor of multiple sites without organ or systems involvement: Secondary | ICD-10-CM | POA: Diagnosis not present

## 2021-03-30 ENCOUNTER — Ambulatory Visit (HOSPITAL_COMMUNITY): Payer: Medicare Other

## 2021-03-31 ENCOUNTER — Ambulatory Visit (HOSPITAL_COMMUNITY)
Admission: RE | Admit: 2021-03-31 | Discharge: 2021-03-31 | Disposition: A | Discharge status: Home / Self Care | Payer: Medicare Other | Source: Ambulatory Visit | Attending: Gastroenterology | Admitting: Gastroenterology

## 2021-03-31 ENCOUNTER — Other Ambulatory Visit: Payer: Self-pay

## 2021-03-31 DIAGNOSIS — R41841 Cognitive communication deficit: Secondary | ICD-10-CM | POA: Diagnosis not present

## 2021-03-31 DIAGNOSIS — Z8673 Personal history of transient ischemic attack (TIA), and cerebral infarction without residual deficits: Secondary | ICD-10-CM | POA: Diagnosis not present

## 2021-03-31 DIAGNOSIS — I700 Atherosclerosis of aorta: Secondary | ICD-10-CM | POA: Diagnosis not present

## 2021-03-31 DIAGNOSIS — G3184 Mild cognitive impairment, so stated: Secondary | ICD-10-CM | POA: Diagnosis not present

## 2021-03-31 DIAGNOSIS — C801 Malignant (primary) neoplasm, unspecified: Secondary | ICD-10-CM | POA: Insufficient documentation

## 2021-03-31 DIAGNOSIS — K5730 Diverticulosis of large intestine without perforation or abscess without bleeding: Secondary | ICD-10-CM | POA: Diagnosis not present

## 2021-03-31 DIAGNOSIS — I517 Cardiomegaly: Secondary | ICD-10-CM | POA: Diagnosis not present

## 2021-03-31 DIAGNOSIS — K6389 Other specified diseases of intestine: Secondary | ICD-10-CM | POA: Insufficient documentation

## 2021-03-31 DIAGNOSIS — D500 Iron deficiency anemia secondary to blood loss (chronic): Secondary | ICD-10-CM | POA: Insufficient documentation

## 2021-03-31 DIAGNOSIS — R1313 Dysphagia, pharyngeal phase: Secondary | ICD-10-CM | POA: Diagnosis not present

## 2021-03-31 DIAGNOSIS — R918 Other nonspecific abnormal finding of lung field: Secondary | ICD-10-CM | POA: Diagnosis not present

## 2021-03-31 LAB — POCT I-STAT CREATININE: Creatinine, Ser: 0.9 mg/dL (ref 0.44–1.00)

## 2021-03-31 MED ORDER — IOHEXOL 350 MG/ML SOLN
80.0000 mL | Freq: Once | INTRAVENOUS | Status: AC | PRN
  Administered 2021-03-31: 80 mL via INTRAVENOUS

## 2021-03-31 MED ORDER — SODIUM CHLORIDE (PF) 0.9 % IJ SOLN
INTRAMUSCULAR | Status: AC
  Filled 2021-03-31: qty 50

## 2021-04-03 ENCOUNTER — Telehealth: Payer: Self-pay

## 2021-04-03 ENCOUNTER — Ambulatory Visit: Payer: Medicare Other | Admitting: Neurology

## 2021-04-03 DIAGNOSIS — C185 Malignant neoplasm of splenic flexure: Secondary | ICD-10-CM | POA: Diagnosis not present

## 2021-04-03 NOTE — Telephone Encounter (Signed)
° °  Pre-operative Risk Assessment    Patient Name: Kathy Velez  DOB: 01-Mar-1932 MRN: [id number]      Request for Surgical Clearance    Procedure:   Colon Surgery  Date of Surgery:  Clearance TBD                                 Surgeon:  Nadeen Landau, MD Surgeon's Group or Practice Name:  [facility] Phone number:  [phone] Fax number:  [phone] to Treasa School, CMA   Type of Clearance Requested:   - Medical  - Pharmacy:  Hold Apixaban (Eliquis)     Type of Anesthesia:  General    Additional requests/questions:  Please advise surgeon/provider what medications should be held. Please fax a copy of clearance to the surgeon's office.  Signed, Beatrix Fetters   04/03/2021, 4:46 PM

## 2021-04-04 NOTE — Telephone Encounter (Signed)
Patient with diagnosis of Afib on Eliquis for anticoagulation.    Procedure: colon surgery - tumor removal Date of procedure: TBD  CHA2DS2-VASc Score = 6  This indicates a 9.7% annual risk of stroke. The patient's score is based upon: CHF History: 0 HTN History: 1 Diabetes History: 0 Stroke History: 2 Vascular Disease History: 0 Age Score: 2 Gender Score: 1  CrCl 38 mL/min Platelet count 238k  Per office protocol, patient can hold Eliquis for 2 days prior to procedure. She is high risk off of anticoag given prior strokes but has previously been cleared by Dr Debara Pickett to hold DOAC for up to 3 days for other procedures. She should resume anticoag as soon as safely possible after this procedure.

## 2021-04-04 NOTE — Telephone Encounter (Signed)
Primary Cardiologist:Kenneth C Hilty, MD  Chart reviewed as part of pre-operative protocol coverage. Because of Kathy Velez past medical history and time since last visit, he/she will require a follow-up visit in order to better assess preoperative cardiovascular risk.  Pre-op covering staff: - Please schedule appointment and call patient to inform them. - Please contact requesting surgeon's office via preferred method (i.e, phone, fax) to inform them of need for appointment prior to surgery.  If applicable, this message will also be routed to pharmacy pool and/or primary cardiologist for input on holding anticoagulant/antiplatelet agent as requested below so that this information is available at time of patient's appointment.   Deberah Pelton, NP  04/04/2021, 4:36 PM

## 2021-04-05 DIAGNOSIS — R41841 Cognitive communication deficit: Secondary | ICD-10-CM | POA: Diagnosis not present

## 2021-04-05 DIAGNOSIS — G3184 Mild cognitive impairment, so stated: Secondary | ICD-10-CM | POA: Diagnosis not present

## 2021-04-05 DIAGNOSIS — Z8673 Personal history of transient ischemic attack (TIA), and cerebral infarction without residual deficits: Secondary | ICD-10-CM | POA: Diagnosis not present

## 2021-04-05 DIAGNOSIS — R1313 Dysphagia, pharyngeal phase: Secondary | ICD-10-CM | POA: Diagnosis not present

## 2021-04-05 NOTE — Telephone Encounter (Signed)
Pt has appt 04/07/21 with Dr. Marlou Porch for pre op clearance. Will froward notes to MD for upcoming appt. Will send FYI to surgeon's office pt has appt 04/07/21.

## 2021-04-05 NOTE — Telephone Encounter (Signed)
Left message for the pt that she is going to need a sooner appt for pre op clearance. We could offer the pt when they call back and appt with Laurann Montana, NP at [facility] 04/20/21 @ 1:55 pre op slot.  I will update the requesting office the pt will need appt for pre op clearance.

## 2021-04-07 ENCOUNTER — Encounter: Payer: Self-pay | Admitting: Cardiology

## 2021-04-07 ENCOUNTER — Ambulatory Visit (INDEPENDENT_AMBULATORY_CARE_PROVIDER_SITE_OTHER): Payer: Medicare Other | Admitting: Cardiology

## 2021-04-07 ENCOUNTER — Other Ambulatory Visit: Payer: Self-pay

## 2021-04-07 VITALS — BP 120/70 | HR 89 | Ht 62.0 in | Wt 122.0 lb

## 2021-04-07 DIAGNOSIS — Z01810 Encounter for preprocedural cardiovascular examination: Secondary | ICD-10-CM

## 2021-04-07 DIAGNOSIS — I10 Essential (primary) hypertension: Secondary | ICD-10-CM

## 2021-04-07 DIAGNOSIS — I482 Chronic atrial fibrillation, unspecified: Secondary | ICD-10-CM | POA: Diagnosis not present

## 2021-04-07 DIAGNOSIS — Z01818 Encounter for other preprocedural examination: Secondary | ICD-10-CM | POA: Diagnosis not present

## 2021-04-07 NOTE — Patient Instructions (Signed)
Medication Instructions:  The current medical regimen is effective;  continue present plan and medications.  *If you need a refill on your cardiac medications before your next appointment, please call your pharmacy*  You have been cleared for surgery.  OK to hold Eliquis for 2 days before your surgery and restart as soon after as the surgeon says it's safe.  Follow-Up: At [facility], you and your health needs are our priority.  As part of our continuing mission to provide you with exceptional heart care, we have created designated Provider Care Teams.  These Care Teams include your primary Cardiologist (physician) and Advanced Practice Providers (APPs -  Physician Assistants and Nurse Practitioners) who all work together to provide you with the care you need, when you need it.  We recommend signing up for the patient portal called "MyChart".  Sign up information is provided on this After Visit Summary.  MyChart is used to connect with patients for Virtual Visits (Telemedicine).  Patients are able to view lab/test results, encounter notes, upcoming appointments, etc.  Non-urgent messages can be sent to your provider as well.   To learn more about what you can do with MyChart, go to [email or web address].    Your next appointment:   Follow up with Dr Debara Pickett as previously scheduled.   Thank you for choosing [facility]!!

## 2021-04-07 NOTE — Assessment & Plan Note (Signed)
Well rate controlled atrial fibrillation.  No changes made to medications.  Metoprolol noted.  Digoxin noted.

## 2021-04-07 NOTE — Assessment & Plan Note (Signed)
She has been able to exercise at [facility] without any difficulty, no anginal symptoms.  She spends over 30 minutes with this activity.  Because of this, I feel comfortable allowing her to proceed with colectomy with low to moderate overall cardiac risk based mainly on her age.  She has had prior mitral and tricuspid repair which appear intact from most recent echocardiogram.  With regards to her Eliquis, I would recommend holding the Eliquis for 2 days prior to her surgery.  She is on the reduced dose of Eliquis.  Given her overall bleeding risks, I would not recommend bridging.  She is at small risk for stroke in this setting.

## 2021-04-07 NOTE — Telephone Encounter (Signed)
Please see office note.  She may proceed with colon surgery with low to moderate overall cardiac risk.  She may hold her Eliquis for 2 days prior to surgery and resume when appropriate.  She does have a small risk of stroke. Candee Furbish, MD

## 2021-04-07 NOTE — Progress Notes (Signed)
Cardiology Office Note:    Date:  04/07/2021   ID:  Kathy Velez, DOB March 16, 1932, MRN [id number]  PCP:  Virgie Dad, MD   [place] [facility] Cardiologist:  Pixie Casino, MD     Referring MD: Virgie Dad, MD    History of Present Illness:    Kathy Velez is a 88 y.o. female here for preoperative risk stratification at the request of Dr. Lyndel Safe.  Has history of PAF, stroke/TIA.  She has been seen by Nadeen Landau of general surgery for fungating mass in the splenic flexure 2 cm in length.  Well differentiated adenocarcinoma.  [name] is her daughter.  She resides at [facility].  The neurologist, Dr. Tomi Likens indicated that she would be is acceptable risk for surgery.  Dr. Debara Pickett, her cardiologist, has been managing her anticoagulation.  Prior tricuspid and mitral valve repair.  She has been doing quite well at [facility].  She exercises on the NuStep stairstepper for 30 minutes at a time.  No chest pain.  No bleeding issues.  She has had some memory impairment since her stroke.  She states that she was married to her husband for over 68 years.  He was a Psychologist, sport and exercise.  They lived in [place].  She went to [employer] and while he was at [employer] doing the beginning of his medical training they met.  They spent time in [place] for 3 years while he was in the [employer].  Past Medical History:  Diagnosis Date   Anemia    Anxiety    Arthritis    Back   Atrial fibrillation ([place])    Cancer ([place]) 2006   Colon.  Basal Cell Skin cancer- right arm   Colon cancer (HCC)    Constipation due to pain medication therapy    after heart surgery   Dysrhythmia    PAF   GERD (gastroesophageal reflux disease)    Heart murmur    Hypertension    Hypothyroidism    RA (rheumatoid arthritis) ([place])    Restless leg    Seizures ([place])    after Heart Surgey   Stroke ([place])    TIA- found by neurologist after     Past Surgical History:  Procedure Laterality Date   ABDOMINAL  HYSTERECTOMY  1970   Partial    COLON RESECTION  2006   cancer   COLON SURGERY     COLONOSCOPY     EYE SURGERY Bilateral    Cataract   MAXIMUM ACCESS (MAS)POSTERIOR LUMBAR INTERBODY FUSION (PLIF) 2 LEVEL N/A 04/12/2015   Procedure: Lumbar Three-Five Decompression, Pedicle Screw Fixation, and Posteriolateral Arthrodesis;  Surgeon: Erline Levine, MD;  Location: [facility];  Service: Neurosurgery;  Laterality: N/A;  L3-4 L4-5 Maximum access posterior lumbar fusion, possible interbodies and resection of synovial cyst at L4-5   MITRAL VALVE REPAIR  01/20/2013   Gore-tex cords to P1, P2, and P3. Magic suture to posterior medial commisure, #30 Physio 1 ring. Done in [place]   TONSILLECTOMY     about [place]  01/20/2013   #28 TriAd ring done in [place]    Current Medications: Current Meds  Medication Sig   acetaminophen (TYLENOL) 325 MG tablet Take 2 tablets (650 mg total) by mouth every 6 (six) hours as needed for mild pain or fever.   albuterol (VENTOLIN HFA) 108 (90 Base) MCG/ACT inhaler Inhale 2 puffs into the lungs every 4 (four) hours as needed for wheezing or shortness  of breath.   amoxicillin (AMOXIL) 500 MG capsule Take 500 mg by mouth. Before dental visits every 6 months   apixaban (ELIQUIS) 2.5 MG TABS tablet Take 1 tablet (2.5 mg total) by mouth 2 (two) times daily.   atorvastatin (LIPITOR) 20 MG tablet TAKE 1 TABLET DAILY   Cholecalciferol (VITAMIN D) 50 MCG (2000 UT) CAPS Take 1 capsule by mouth daily.   digoxin (LANOXIN) 0.125 MG tablet TAKE 1 TABLET DAILY   doxycycline (VIBRA-TABS) 100 MG tablet Take 1 tablet (100 mg total) by mouth 2 (two) times daily.   ENBREL SURECLICK 50 MG/ML injection    Iron, Ferrous Sulfate, 325 (65 Fe) MG TABS Take 325 mg by mouth daily.   leflunomide (ARAVA) 20 MG tablet Take 20 mg by mouth daily.   levothyroxine (SYNTHROID) 75 MCG tablet Take one tablet by mouth once daily 30 minutes before breakfast on empty stomach.    metoprolol succinate (TOPROL-XL) 50 MG 24 hr tablet TAKE 1 TABLET DAILY   mirtazapine (REMERON) 15 MG tablet TAKE 1 TABLET AT BEDTIME   MULTIPLE VITAMIN PO Take by mouth.   Multiple Vitamins-Minerals (PRESERVISION AREDS 2) CAPS Take 2 Doses by mouth daily at 6 (six) AM. For eye health   omeprazole (PRILOSEC) 40 MG capsule TAKE 1 CAPSULE DAILY   Psyllium (METAMUCIL MULTIHEALTH FIBER) 63 % POWD Take 1 Scoop by mouth in the morning.   rivastigmine (EXELON) 1.5 MG capsule Take 1 capsule (1.5 mg total) by mouth 2 (two) times daily.   valsartan (DIOVAN) 80 MG tablet Take 1 tablet (80 mg total) by mouth daily.   Vibegron (GEMTESA PO) Take by mouth daily. Dose unknown   vitamin B-12 (CYANOCOBALAMIN) 1000 MCG tablet Take 1,000 mcg by mouth daily.      Allergies:   Codeine, Gabapentin, Molds & smuts, Pollen extract, and Bee venom   Social History   Socioeconomic History   Marital status: Widowed    Spouse name: Not on file   Number of children: Not on file   Years of education: Not on file   Highest education level: Not on file  Occupational History   Occupation: elementary school teacher    Comment: retired   Tobacco Use   Smoking status: Never   Smokeless tobacco: Never  Vaping Use   Vaping Use: Never used  Substance and Sexual Activity   Alcohol use: Yes    Alcohol/week: 1.0 standard drink    Types: 1 Glasses of wine per week    Comment: 1-2 per week   Drug use: No   Sexual activity: Not Currently  Other Topics Concern   Not on file  Social History Narrative   Social History      Diet? Healthy- low salt, sugar, fat      Do you drink/eat things with caffeine? On occasion      Marital status?        widow                        What year were you married? 1958      Do you live in a house, apartment, assisted living, condo, trailer, etc.? apartment      Is it one or more stories? one      How many persons live in your home? one      Do you have any pets in your home?  (please list) no      Highest level of education completed? 4  year college      Current or past profession: elementary teacher      Do you exercise?             yes                         Type & how often? Classes- senior retirement community/ some walking      Advanced Directives      Do you have a living will?      Do you have a DNR form?                                  If not, do you want to discuss one?      Do you have signed POA/HPOA for forms?       Functional Status Completed by: Daughter, Di Kindle      Do you have difficulty bathing or dressing yourself? no      Do you have difficulty preparing food or eating? no      Do you have difficulty managing your medications? no      Do you have difficulty managing your finances? no      Do you have difficulty affording your medications? no   Right handed   At [facility]   Social Determinants of Health   Financial Resource Strain: Not on file  Food Insecurity: Not on file  Transportation Needs: Not on file  Physical Activity: Not on file  Stress: Not on file  Social Connections: Not on file     Family History: The patient's family history includes Alcohol abuse in her son; Heart disease (age of onset: 28) in her father; Lung disease (age of onset: 46) in her mother. There is no history of Colon cancer, Stomach cancer, or Rectal cancer.  ROS:   Please see the history of present illness.    No fevers chills nausea vomiting syncope all other systems reviewed and are negative.  EKGs/Labs/Other Studies Reviewed:    The following studies were reviewed today: Echocardiogram 11/27/2018:    1. The left ventricle has normal systolic function with an ejection  fraction of 60-65%. The cavity size was normal. There is mildly increased  left ventricular wall thickness. Left ventricular diastolic function could  not be evaluated secondary to atrial   fibrillation. No evidence of left ventricular regional wall motion   abnormalities.   2. Left atrial size was severely dilated.   3. A 30 mm PhysioRing valve is present in the mitral position.   4. Status post tricuspid valve repair. 28 mm TriAd annuloplasty ring is  present in the tricuspid position.   5. The tricuspid valve is abnormal.   6. The aortic valve is tricuspid. Mild sclerosis of the aortic valve. No  stenosis of the aortic valve.   7. The aorta is normal unless otherwise noted.   8. The inferior vena cava was normal in size with <50% respiratory  variability.   SUMMARY     LVEF 60-65%, mild LVH, normal wall motion, midly reduced RV systolic  function, severe LAE, s/p 30 mm Physio-Ring MV repair with no  stenosis and mild MR, s/p 29 mm TriAd annuloplasty ring, no stenosis  and trivial TR, aortic valve sclerosis, IVC suggests elevated RA  pressure of 8 mmHg.   EKG:  EKG is  ordered today.  The ekg ordered today demonstrates atrial  fibrillation 89 bpm  Recent Labs: 12/30/2020: ALT 16 02/02/2021: BUN 14; Hemoglobin 9.5; Platelets 215; Potassium 4.7; Sodium 137; TSH 2.11 03/31/2021: Creatinine, Ser 0.90  Recent Lipid Panel    Component Value Date/Time   CHOL 115 02/02/2021 0000   TRIG 91 02/02/2021 0000   HDL 48 02/02/2021 0000   LDLCALC 49 02/02/2021 0000     Risk Assessment/Calculations:    CHA2DS2-VASc Score = 6   This indicates a 9.7% annual risk of stroke. The patient's score is based upon: CHF History: 0 HTN History: 1 Diabetes History: 0 Stroke History: 2 Vascular Disease History: 0 Age Score: 2 Gender Score: 1              Physical Exam:    VS:  BP 120/70 (BP Location: Left Arm, Patient Position: Sitting, Cuff Size: Normal)    Pulse 89    Ht _0  (1.575 m)    Wt 122 lb (55.3 kg)    BMI 22.31 kg/m     Wt Readings from Last 3 Encounters:  04/07/21 122 lb (55.3 kg)  03/10/21 121 lb (54.9 kg)  03/06/21 123 lb 3.2 oz (55.9 kg)     GEN:  Well nourished, well developed in no acute distress uses an  Rollator HEENT: Normal NECK: No JVD; No carotid bruits LYMPHATICS: No lymphadenopathy CARDIAC: IRRR, no murmurs, no rubs, gallops RESPIRATORY:  Clear to auscultation without rales, wheezing or rhonchi  ABDOMEN: Soft, non-tender, non-distended MUSCULOSKELETAL:  No edema; No deformity  SKIN: Warm and dry NEUROLOGIC:  Alert and oriented x 3 PSYCHIATRIC:  Normal affect   ASSESSMENT:    1. HTN (hypertension), benign   2. Pre-op evaluation   3. Chronic atrial fibrillation ([place])   4. Preop cardiovascular exam    PLAN:    In order of problems listed above:  Preop cardiovascular exam She has been able to exercise at [facility] without any difficulty, no anginal symptoms.  She spends over 30 minutes with this activity.  Because of this, I feel comfortable allowing her to proceed with colectomy with low to moderate overall cardiac risk based mainly on her age.  She has had prior mitral and tricuspid repair which appear intact from most recent echocardiogram.  With regards to her Eliquis, I would recommend holding the Eliquis for 2 days prior to her surgery.  She is on the reduced dose of Eliquis.  Given her overall bleeding risks, I would not recommend bridging.  She is at small risk for stroke in this setting.  Chronic atrial fibrillation (HCC) Well rate controlled atrial fibrillation.  No changes made to medications.  Metoprolol noted.  Digoxin noted.         Medication Adjustments/Labs and Tests Ordered: Current medicines are reviewed at length with the patient today.  Concerns regarding medicines are outlined above.  Orders Placed This Encounter  Procedures   EKG 12-Lead   No orders of the defined types were placed in this encounter.   Patient Instructions  Medication Instructions:  The current medical regimen is effective;  continue present plan and medications.  *If you need a refill on your cardiac medications before your next appointment, please call your  pharmacy*  You have been cleared for surgery.  OK to hold Eliquis for 2 days before your surgery and restart as soon after as the surgeon says it's safe.  Follow-Up: At [facility], you and your health needs are our priority.  As part of our continuing mission to provide you with  exceptional heart care, we have created designated Provider Care Teams.  These Care Teams include your primary Cardiologist (physician) and Advanced Practice Providers (APPs -  Physician Assistants and Nurse Practitioners) who all work together to provide you with the care you need, when you need it.  We recommend signing up for the patient portal called "MyChart".  Sign up information is provided on this After Visit Summary.  MyChart is used to connect with patients for Virtual Visits (Telemedicine).  Patients are able to view lab/test results, encounter notes, upcoming appointments, etc.  Non-urgent messages can be sent to your provider as well.   To learn more about what you can do with MyChart, go to [email or web address].    Your next appointment:   Follow up with Dr Debara Pickett as previously scheduled.   Thank you for choosing [facility]!!      Signed, Candee Furbish, MD  04/07/2021 3:01 PM    [place]

## 2021-04-10 ENCOUNTER — Encounter: Payer: Medicare Other | Admitting: Adult Health

## 2021-04-10 NOTE — Telephone Encounter (Signed)
° °  Primary Cardiologist: Pixie Casino, MD  Chart reviewed as part of pre-operative protocol coverage. Given past medical history and time since last visit, based on ACC/AHA guidelines, Kathy Velez would be at acceptable risk for the planned procedure without further cardiovascular testing.   "She may proceed with colon surgery with low to moderate overall cardiac risk.  She may hold her Eliquis for 2 days prior to surgery and resume when appropriate.  She does have a small risk of stroke." Kathy Furbish, MD  I will route this recommendation to the requesting party via [facility] fax function and remove from pre-op pool.  Please call with questions.  Kathy Ng. Leyda Vanderwerf NP-C    04/10/2021, 8:38 AM [place] Office [phone] Fax [phone]

## 2021-04-12 DIAGNOSIS — G3184 Mild cognitive impairment, so stated: Secondary | ICD-10-CM | POA: Diagnosis not present

## 2021-04-12 DIAGNOSIS — R41841 Cognitive communication deficit: Secondary | ICD-10-CM | POA: Diagnosis not present

## 2021-04-12 DIAGNOSIS — R1313 Dysphagia, pharyngeal phase: Secondary | ICD-10-CM | POA: Diagnosis not present

## 2021-04-12 DIAGNOSIS — Z8673 Personal history of transient ischemic attack (TIA), and cerebral infarction without residual deficits: Secondary | ICD-10-CM | POA: Diagnosis not present

## 2021-04-17 ENCOUNTER — Encounter: Payer: Self-pay | Admitting: *Deleted

## 2021-04-17 ENCOUNTER — Encounter: Payer: Medicare Other | Admitting: Adult Health

## 2021-04-17 ENCOUNTER — Other Ambulatory Visit: Payer: Self-pay

## 2021-04-17 ENCOUNTER — Inpatient Hospital Stay: Payer: Medicare Other | Attending: Oncology | Admitting: Oncology

## 2021-04-17 ENCOUNTER — Inpatient Hospital Stay: Payer: Medicare Other

## 2021-04-17 VITALS — BP 150/91 | HR 104 | Temp 97.9°F | Resp 18 | Ht 62.0 in | Wt 121.0 lb

## 2021-04-17 DIAGNOSIS — D649 Anemia, unspecified: Secondary | ICD-10-CM

## 2021-04-17 LAB — SAVE SMEAR(SSMR), FOR PROVIDER SLIDE REVIEW

## 2021-04-17 LAB — CBC WITH DIFFERENTIAL (CANCER CENTER ONLY)
Abs Immature Granulocytes: 0.04 10*3/uL (ref 0.00–0.07)
Basophils Absolute: 0.1 10*3/uL (ref 0.0–0.1)
Basophils Relative: 1 %
Eosinophils Absolute: 0.1 10*3/uL (ref 0.0–0.5)
Eosinophils Relative: 2 %
HCT: 29.8 % — ABNORMAL LOW (ref 36.0–46.0)
Hemoglobin: 9.7 g/dL — ABNORMAL LOW (ref 12.0–15.0)
Immature Granulocytes: 1 %
Lymphocytes Relative: 22 %
Lymphs Abs: 1.5 10*3/uL (ref 0.7–4.0)
MCH: 33.7 pg (ref 26.0–34.0)
MCHC: 32.6 g/dL (ref 30.0–36.0)
MCV: 103.5 fL — ABNORMAL HIGH (ref 80.0–100.0)
Monocytes Absolute: 1 10*3/uL (ref 0.1–1.0)
Monocytes Relative: 15 %
Neutro Abs: 4.1 10*3/uL (ref 1.7–7.7)
Neutrophils Relative %: 59 %
Platelet Count: 229 10*3/uL (ref 150–400)
RBC: 2.88 MIL/uL — ABNORMAL LOW (ref 3.87–5.11)
RDW: 16.4 % — ABNORMAL HIGH (ref 11.5–15.5)
WBC Count: 6.8 10*3/uL (ref 4.0–10.5)
nRBC: 0 % (ref 0.0–0.2)

## 2021-04-17 LAB — FERRITIN: Ferritin: 28 ng/mL (ref 11–307)

## 2021-04-17 LAB — RETICULOCYTES
Immature Retic Fract: 27.3 % — ABNORMAL HIGH (ref 2.3–15.9)
RBC.: 2.92 MIL/uL — ABNORMAL LOW (ref 3.87–5.11)
Retic Count, Absolute: 72.7 10*3/uL (ref 19.0–186.0)
Retic Ct Pct: 2.5 % (ref 0.4–3.1)

## 2021-04-17 LAB — SAMPLE TO BLOOD BANK

## 2021-04-17 NOTE — Progress Notes (Signed)
PATIENT NAVIGATOR PROGRESS NOTE  Name: Darleene Cumpian Date: 04/17/2021 MRN: [id number]  DOB: 08-03-1931   Reason for visit:  Initial visit with Dr Benay Spice  Comments:  Met with Mrs Hout and her daughter Ms Charna Archer during initial visit with Dr Benay Spice.  Added on IHC (MMR and MSI testing) to [id number] from colonoscopy from [facility].  Blood work today to check anemia status. Has appt with Dr Dema Severin on Wednesday for F/U consult Will F/U with Dr Benay Spice on 05/16/21  Given contact information to call with any issues or questions   Time spent counseling/coordinating care: > 60 minutes

## 2021-04-17 NOTE — Progress Notes (Addendum)
[facility] New Patient Consult   Requesting MD: Ileana Roup, [place],  [place] [address]   Kathy Velez 88 y.o.  12/12/1931    Reason for Consult: Colon cancer   HPI: Kathy Velez was evaluated by her primary provider in October.  She was noted to have anemia and a Hemoccult positive stool.  On 12/30/2020 the hemoglobin returned at 8.7, WBC 7.0, and platelets 240,000.  Repeat CBC on 01/16/2021 found the hemoglobin 8.5 with an MCV of 100.6.  The vitamin B12 level returned at 1083 and ferritin 31.1.  She was referred to Dr. Fuller Plan.  She was taken to an upper endoscopy and colonoscopy on 02/22/2021.  There was a benign-appearing stenosis of the esophagus at 37 cm from the incisors.  There was mild erythematous mucosa in the gastric body and antrum.  Biopsies were obtained.  The colonoscopy revealed an end-to-end ileocolonic anastomosis in the transverse colon.  A 25 mm polyp was removed from the transverse colon.  A partially obstructing mass was found at the splenic flexure.  A biopsy was obtained and the area was tattooed.  The mass was not bleeding.  There was diverticulosis in the left colon.  The pathology revealed a sessile serrated polyp with a small focus of high-grade dysplasia at the transverse colon.  The splenic flexure mass returned as invasive well differentiated adenocarcinoma.  The stomach biopsies revealed no significant abnormality.  She was referred to Dr. Dema Severin.  CTs of the chest, abdomen, and pelvis on 03/31/2021 revealed enlargement of the heart.  Biapical pleural-parenchymal scarring and subpleural reticular scarring bilaterally.  4 mm left upper lobe nodule.  No focal hepatic lesion.  No evidence of metastatic disease.  Possible mass at the splenic flexure.  Kathy Velez and her daughter had an extensive discussion with Dr. Dema Severin regarding the risk/benefits of surgery.  She presents today for medical oncology evaluation.  Her chief  complaint is malaise.  No bleeding.  Past Medical History:  Diagnosis Date   Anemia    Anxiety    Arthritis    Back   Atrial fibrillation ([place])    Cancer ([place]) 2006   Colon.  Basal Cell Skin cancer- right arm   Colon cancer (HCC)-splenic flexure January 2023   Constipation due to pain medication therapy    after heart surgery   Dysrhythmia    PAF   GERD (gastroesophageal reflux disease)    Heart murmur    Hypertension    Hypothyroidism    RA (rheumatoid arthritis) (HCC)    Restless leg    Seizures ([place])    after Heart Surgey   Stroke ([place])    TIA- found by neurologist after     .  G2 P2  Past Surgical History:  Procedure Laterality Date   ABDOMINAL HYSTERECTOMY  1970   Partial    COLON RESECTION  2006   cancer   COLON SURGERY     COLONOSCOPY     EYE SURGERY Bilateral    Cataract   MAXIMUM ACCESS (MAS)POSTERIOR LUMBAR INTERBODY FUSION (PLIF) 2 LEVEL N/A 04/12/2015   Procedure: Lumbar Three-Five Decompression, Pedicle Screw Fixation, and Posteriolateral Arthrodesis;  Surgeon: Erline Levine, MD;  Location: [facility];  Service: Neurosurgery;  Laterality: N/A;  L3-4 L4-5 Maximum access posterior lumbar fusion, possible interbodies and resection of synovial cyst at L4-5   MITRAL VALVE REPAIR  01/20/2013   Gore-tex cords to P1, P2, and P3. Magic suture to posterior medial  commisure, #30 Physio 1 ring. Done in [place]   TONSILLECTOMY     about [place]  01/20/2013   #28 TriAd ring done in [place]    Medications: Reviewed  Allergies:  Allergies  Allergen Reactions   Codeine Other (See Comments)    "just don't take it well"   Gabapentin     dizziness   Molds & Smuts    Pollen Extract Swelling   Bee Venom Swelling and Rash    Family history: No family history of cancer  Social History:   She lives independently at [employer] [employer]  Her daughter lives in [place].  She not use cigarettes.  She reports social alcohol use.  No transfusion  history.  ROS:   Positives include: Urinary frequency, intermittent fecal incontinence, fatigue, exertional dyspnea  A complete ROS was otherwise negative.  Physical Exam:  Blood pressure (!) 150/91, pulse (!) 104, temperature 97.9 F (36.6 C), temperature source Oral, resp. rate 18, height $RemoveBe'5\' 2"'PcjoHgKDj$  (1.575 m), weight 121 lb (54.9 kg), SpO2 96 %.  HEENT: No thrush, neck without mass Lungs: Distant breath sounds, no respiratory distress Cardiac: Irregular Abdomen: No hepatosplenomegaly, no mass, nontender  Vascular: No leg edema Lymph nodes: No cervical, supraclavicular, axillary, or inguinal nodes Neurologic: Alert and oriented, has difficulty with memory recall, the motor exam appears intact in the upper and lower extremities bilaterally Skin: No rash Musculoskeletal: No spine tenderness, joint deformities of the hands   LAB:  CBC  Lab Results  Component Value Date   WBC 6.8 04/17/2021   HGB 9.7 (L) 04/17/2021   HCT 29.8 (L) 04/17/2021   MCV 103.5 (H) 04/17/2021   PLT 229 04/17/2021   NEUTROABS 4.1 04/17/2021        CMP  Lab Results  Component Value Date   NA 137 02/02/2021   K 4.7 02/02/2021   CL 99 02/02/2021   CO2 24 (A) 02/02/2021   GLUCOSE 96 09/07/2020   BUN 14 02/02/2021   CREATININE 0.90 03/31/2021   CALCIUM 9.5 02/02/2021   PROT 6.5 03/29/2020   ALBUMIN 4.1 12/30/2020   AST 21 12/30/2020   ALT 16 12/30/2020   ALKPHOS 96 12/30/2020   BILITOT 0.8 03/29/2020   GFRNONAA 68.46 02/02/2021   GFRAA 79.35 02/02/2021     No results found for: CEA1, CAN199, CA125  Imaging:  No results found.    Assessment/Plan:   Colon cancer-splenic flexure mass on colonoscopy 02/22/2021 Biopsy 02/22/2021-invasive well differentiated adenocarcinoma CTs 03/31/2021-no evidence of metastatic disease, possible mass at the splenic flexure, 4 mm left upper lung nodule, bilateral lung scarring Macrocytic anemia Sessile serrated polyp with focus of high-grade dysplasia on  colonoscopy 02/22/2021 Colon cancer 2006, status post a right colectomy-"node positive "cancer per patient, received adjuvant chemotherapy Atrial fibrillation, maintained on anticoagulation History of a CVA and TIA Mild cognitive impairment Rheumatoid arthritis Hypothyroidism 10.  Chronic lung disease?   Disposition:   Kathy Velez has been diagnosed with adenocarcinoma of the splenic flexure.  There is no clinical or radiologic evidence of metastatic disease.  She saw Dr. Dema Severin to discuss surgical options.  He discussed the probable morbidity associated with surgery as this may worsen her fecal incontinence or require a permanent colostomy.  She and her daughter are considering surgery and will see Dr. Dema Severin again later this week.  They are concerned about surgical morbidity.  She has multiple comorbid conditions including cognitive impairment.  Kathy Velez has a remote history of colon cancer treated  in [place].  We will obtain records from the 2006 colon cancer diagnosis.  I discussed treatment options if she elects against surgery.  We discussed single agent capecitabine and observation.  We will submit the splenic flexure biopsy for mismatch repair protein testing.  She may have hereditary non polyposis colon cancer syndrome or a sporadic MSI high tumor.  We can consider a PD1 inhibitor in either case.  She has macrocytic anemia.  I suspect this and chronic lung disease are contributing to her dyspnea.  The etiology of the anemia is unclear.  The ferritin returned in the low normal range last fall and the anemia has not corrected with iron replacement.  The anemia may be related to bleeding from ongoing blood loss while on anticoagulation therapy or another etiology.  The differential diagnosis includes myelodysplasia and anemia of chronic disease.  She will return to the lab for a CBC today.  She will return for an office visit and further discussion during the week of 05/15/2021.  Betsy Coder, MD  04/17/2021, 1:31 PM  Blood smear review-the platelets appear normal in number.  There are hypolobated and hypogranular neutrophils.  No blasts or other young forms are seen.  No monotonous white cell population.  There is marked variation in red cell size and shape.  Numerous ovalocytes, teardrops, target cells, and a acanthocytes.  There are macrocytes and microcytes.  No nucleated red cells.  The peripheral blood smear findings are consistent with a myelodysplastic or myeloproliferative disorder.

## 2021-04-17 NOTE — Addendum Note (Signed)
Addended by: Betsy Coder B on: 04/17/2021 03:03 PM   Modules accepted: Level of Service

## 2021-04-18 ENCOUNTER — Other Ambulatory Visit: Payer: Self-pay

## 2021-04-18 ENCOUNTER — Telehealth: Payer: Self-pay

## 2021-04-18 DIAGNOSIS — D649 Anemia, unspecified: Secondary | ICD-10-CM

## 2021-04-18 NOTE — Telephone Encounter (Signed)
[facility] lab called spoke with Otila Kluver to see if lab could be added. orders placed today.

## 2021-04-18 NOTE — Telephone Encounter (Signed)
-----   Message from Ladell Pier, MD sent at 04/17/2021  5:46 PM EST ----- Add soluble transferrin receptor to the lab from 04/17/2021

## 2021-04-19 ENCOUNTER — Other Ambulatory Visit (HOSPITAL_BASED_OUTPATIENT_CLINIC_OR_DEPARTMENT_OTHER): Payer: Self-pay

## 2021-04-19 DIAGNOSIS — D649 Anemia, unspecified: Secondary | ICD-10-CM

## 2021-04-19 DIAGNOSIS — C185 Malignant neoplasm of splenic flexure: Secondary | ICD-10-CM | POA: Diagnosis not present

## 2021-04-20 ENCOUNTER — Telehealth: Payer: Self-pay

## 2021-04-20 LAB — SOLUBLE TRANSFERRIN RECEPTOR: Transferrin Receptor: 19.5 nmol/L (ref 12.2–27.3)

## 2021-04-20 NOTE — Telephone Encounter (Signed)
-----   Message from Ladell Pier, MD sent at 04/20/2021  1:55 PM EST ----- Please call daughter, the test for iron deficiency are normal, I suspect the anemia is related to bleeding from the colon tumor and possibly a bone marrow disorder such as myelodysplasia, we will discuss when she returns next month, there is nothing different to do for the anemia at present

## 2021-04-20 NOTE — Telephone Encounter (Signed)
TC to Pt's daughter relayed message from Dr. Benay Spice. Pt's daughter verbalized understanding and will relay message to Pt.

## 2021-04-24 ENCOUNTER — Non-Acute Institutional Stay (SKILLED_NURSING_FACILITY): Payer: Medicare Other | Admitting: Adult Health

## 2021-04-24 ENCOUNTER — Encounter: Payer: Medicare Other | Admitting: Adult Health

## 2021-04-24 ENCOUNTER — Other Ambulatory Visit: Payer: Self-pay

## 2021-04-24 VITALS — BP 138/84 | HR 102 | Temp 98.1°F | Ht 62.0 in | Wt 123.0 lb

## 2021-04-24 DIAGNOSIS — I482 Chronic atrial fibrillation, unspecified: Secondary | ICD-10-CM | POA: Diagnosis not present

## 2021-04-24 DIAGNOSIS — R159 Full incontinence of feces: Secondary | ICD-10-CM | POA: Diagnosis not present

## 2021-04-24 DIAGNOSIS — D509 Iron deficiency anemia, unspecified: Secondary | ICD-10-CM | POA: Diagnosis not present

## 2021-04-24 DIAGNOSIS — M06049 Rheumatoid arthritis without rheumatoid factor, unspecified hand: Secondary | ICD-10-CM | POA: Diagnosis not present

## 2021-04-24 DIAGNOSIS — E782 Mixed hyperlipidemia: Secondary | ICD-10-CM | POA: Diagnosis not present

## 2021-04-24 DIAGNOSIS — N3281 Overactive bladder: Secondary | ICD-10-CM

## 2021-04-24 DIAGNOSIS — F01A0 Vascular dementia, mild, without behavioral disturbance, psychotic disturbance, mood disturbance, and anxiety: Secondary | ICD-10-CM

## 2021-04-24 DIAGNOSIS — I10 Essential (primary) hypertension: Secondary | ICD-10-CM

## 2021-04-24 DIAGNOSIS — C189 Malignant neoplasm of colon, unspecified: Secondary | ICD-10-CM

## 2021-04-24 DIAGNOSIS — R152 Fecal urgency: Secondary | ICD-10-CM | POA: Diagnosis not present

## 2021-04-24 DIAGNOSIS — E039 Hypothyroidism, unspecified: Secondary | ICD-10-CM

## 2021-04-24 DIAGNOSIS — J849 Interstitial pulmonary disease, unspecified: Secondary | ICD-10-CM | POA: Diagnosis not present

## 2021-04-24 NOTE — Progress Notes (Signed)
Location:  [name]  POS: clinic  Provider:  Cindi Carbon, ANP [facility] [phone]    Goals of Care:  Advanced Directives 03/10/2021  Does Patient Have a Medical Advance Directive? Yes  Type of Advance Directive -  Does patient want to make changes to medical advance directive? -  Copy of [place] in Chart? -  Would patient like information on creating a medical advance directive? -  Pre-existing out of facility DNR order (yellow form or pink MOST form) -     Chief Complaint  Patient presents with   Medical Management of Chronic Issues    Patient returns to the clinic for follow up.     HPI: Patient is a 88 y.o. female seen today for medical management of chronic diseases.    Reports she is facing a tough decision regarding having colon surgery with colostomy due to colon cancer and she is apprehensive about this and the risk of surgery.  She had a CT done 03/31/21 of the abd which showed  mass in the spenic flexure, no evidence of mets, and one lung nodule measuring 4 mm in the left lobe.  Colonoscopy 02/22/21 showed a partially obstructing mass at the splenic flexure and a polyp. Bx revealed adenocarcinoma. She has a prior hx of colon cancer with colectomy.  At this time she is not having any abd pain, weight loss, or rectal bleeding. Occult positive for blood on iron. Saw Dr Dema Severin and he offered surgery with colostomy. Saw Dr Learta Codding. May be a candidate for chemo/immunotherapy. Going back 2/20 for definitive recommendation. Dr Benay Spice thinks she has myleodysplasia as well.    She reports that she works out on step machine regularly and does an exercise class 5 days a week geared toward geriatric pts.   She does have some sob at exertion. Long term hx of ILD and RA. Also chronic anemia  Follows with rheumatology regarding her RA. States at this time it is well controlled. She has some pain in her knees and hands.   Continues with  short term memory loss. Able to ambulate. Has some fecal incontinence and wears a brief. Her daughter fills her pill box. She is on exelon. Follows with Dr Tomi Likens. MMSE 20/30 12/22. MRI of the brain showed cerebral amyloid angiopathy. Has prior hx of CVA as well.   I called Hosie Spangle and she said that Ms. Johnette Abraham is having some fecal incontinence and wanted to know what we could do for it. She is on metamucil.  Dexa scan 08/24/20 showed osteopenia T score -2.0 Past Medical History:  Diagnosis Date   Anemia    Anxiety    Arthritis    Back   Atrial fibrillation ([place])    Cancer ([place]) 2006   Colon.  Basal Cell Skin cancer- right arm   Colon cancer (HCC)    Constipation due to pain medication therapy    after heart surgery   Dysrhythmia    PAF   GERD (gastroesophageal reflux disease)    Heart murmur    Hypertension    Hypothyroidism    RA (rheumatoid arthritis) ([place])    Restless leg    Seizures ([place])    after Heart Surgey   Stroke ([place])    TIA- found by neurologist after     Past Surgical History:  Procedure Laterality Date   ABDOMINAL HYSTERECTOMY  1970   Partial    COLON RESECTION  2006   cancer   COLON  SURGERY     COLONOSCOPY     EYE SURGERY Bilateral    Cataract   MAXIMUM ACCESS (MAS)POSTERIOR LUMBAR INTERBODY FUSION (PLIF) 2 LEVEL N/A 04/12/2015   Procedure: Lumbar Three-Five Decompression, Pedicle Screw Fixation, and Posteriolateral Arthrodesis;  Surgeon: Erline Levine, MD;  Location: [facility];  Service: Neurosurgery;  Laterality: N/A;  L3-4 L4-5 Maximum access posterior lumbar fusion, possible interbodies and resection of synovial cyst at L4-5   MITRAL VALVE REPAIR  01/20/2013   Gore-tex cords to P1, P2, and P3. Magic suture to posterior medial commisure, #30 Physio 1 ring. Done in [place]   TONSILLECTOMY     about [place]  01/20/2013   #28 TriAd ring done in [place]    Allergies  Allergen Reactions   Codeine Other (See Comments)    "just don't  take it well"   Gabapentin     dizziness   Molds & Smuts    Pollen Extract Swelling   Bee Venom Swelling and Rash    Outpatient Encounter Medications as of 04/24/2021  Medication Sig   acetaminophen (TYLENOL) 325 MG tablet Take 2 tablets (650 mg total) by mouth every 6 (six) hours as needed for mild pain or fever.   albuterol (VENTOLIN HFA) 108 (90 Base) MCG/ACT inhaler Inhale 2 puffs into the lungs every 4 (four) hours as needed for wheezing or shortness of breath.   amoxicillin (AMOXIL) 500 MG capsule Take 500 mg by mouth. Before dental visits every 6 months   apixaban (ELIQUIS) 2.5 MG TABS tablet Take 1 tablet (2.5 mg total) by mouth 2 (two) times daily.   atorvastatin (LIPITOR) 20 MG tablet TAKE 1 TABLET DAILY   Cholecalciferol (VITAMIN D) 50 MCG (2000 UT) CAPS Take 1 capsule by mouth daily.   digoxin (LANOXIN) 0.125 MG tablet TAKE 1 TABLET DAILY   doxycycline (VIBRA-TABS) 100 MG tablet Take 1 tablet (100 mg total) by mouth 2 (two) times daily.   ENBREL SURECLICK 50 MG/ML injection    Iron, Ferrous Sulfate, 325 (65 Fe) MG TABS Take 325 mg by mouth daily.   leflunomide (ARAVA) 20 MG tablet Take 20 mg by mouth daily.   levothyroxine (SYNTHROID) 75 MCG tablet Take one tablet by mouth once daily 30 minutes before breakfast on empty stomach.   metoprolol succinate (TOPROL-XL) 50 MG 24 hr tablet TAKE 1 TABLET DAILY   mirtazapine (REMERON) 15 MG tablet TAKE 1 TABLET AT BEDTIME   MULTIPLE VITAMIN PO Take by mouth.   Multiple Vitamins-Minerals (PRESERVISION AREDS 2) CAPS Take 2 Doses by mouth daily at 6 (six) AM. For eye health   omeprazole (PRILOSEC) 40 MG capsule TAKE 1 CAPSULE DAILY   Psyllium (METAMUCIL MULTIHEALTH FIBER) 63 % POWD Take 1 Scoop by mouth in the morning.   rivastigmine (EXELON) 1.5 MG capsule Take 1 capsule (1.5 mg total) by mouth 2 (two) times daily. (Patient not taking: Reported on 04/17/2021)   valsartan (DIOVAN) 80 MG tablet Take 1 tablet (80 mg total) by mouth daily.    Vibegron (GEMTESA PO) Take by mouth daily. Dose unknown   vitamin B-12 (CYANOCOBALAMIN) 1000 MCG tablet Take 1,000 mcg by mouth daily.    No facility-administered encounter medications on file as of 04/24/2021.    Review of Systems:  Review of Systems  Health Maintenance  Topic Date Due   COVID-19 Vaccine (5 - Booster for Moderna series) 03/03/2021   TETANUS/TDAP  04/16/2027   Pneumonia Vaccine 73+ Years old  Completed  INFLUENZA VACCINE  Completed   DEXA SCAN  Completed   Zoster Vaccines- Shingrix  Completed   HPV VACCINES  Aged Out    Physical Exam: Vitals:   04/24/21 1441  BP: 138/84  Pulse: (!) 102  Temp: 98.1 F (36.7 C)  SpO2: 98%  Weight: 123 lb (55.8 kg)  Height: 5\' 2"  (1.575 m)   Body mass index is 22.5 kg/m. Wt Readings from Last 3 Encounters:  04/24/21 123 lb (55.8 kg)  04/17/21 121 lb (54.9 kg)  04/07/21 122 lb (55.3 kg)    Physical Exam  Labs reviewed: Basic Metabolic Panel: Recent Labs    09/06/20 1127 09/07/20 0037 12/30/20 0000 02/02/21 0000 03/31/21 1359  NA 135 138 133* 137  --   K 4.0 4.3 4.4 4.7  --   CL 102 102 100 99  --   CO2 25 26 23* 24*  --   GLUCOSE 114* 96  --   --   --   BUN 19 15 18 14   --   CREATININE 0.75 0.85 0.8 0.8 0.90  CALCIUM 9.2 9.1 9.0 9.5  --   TSH  --   --   --  2.11  --    Liver Function Tests: Recent Labs    12/30/20 0000  AST 21  ALT 16  ALKPHOS 96  ALBUMIN 4.1   No results for input(s): LIPASE, AMYLASE in the last 8760 hours. No results for input(s): AMMONIA in the last 8760 hours. CBC: Recent Labs    09/07/20 0037 12/30/20 0000 01/16/21 1612 02/02/21 0000 04/17/21 1137  WBC 8.5   < > 7.1 5.7 6.8  NEUTROABS  --   --  3.4  --  4.1  HGB 10.1*   < > 8.5 Repeated and verified X2.* 9.5* 9.7*  HCT 30.4*   < > 26.0* 29* 29.8*  MCV 104.5*  --  100.6*  --  103.5*  PLT 188   < > 238.0 215 229   < > = values in this interval not displayed.   Lipid Panel: Recent Labs    02/02/21 0000  CHOL 115   HDL 48  LDLCALC 49  TRIG 91   Lab Results  Component Value Date   HGBA1C 5.5 01/12/2013    Procedures since last visit: CT CHEST ABDOMEN PELVIS W CONTRAST  Result Date: 04/01/2021 CLINICAL DATA:  Patient with history of colon cancer status post partial colectomy diagnosed with mass on colonoscopy at the splenic flexure. EXAM: CT CHEST, ABDOMEN, AND PELVIS WITH CONTRAST TECHNIQUE: Multidetector CT imaging of the chest, abdomen and pelvis was performed following the standard protocol during bolus administration of intravenous contrast. CONTRAST:  25mL OMNIPAQUE IOHEXOL 350 MG/ML SOLN COMPARISON:  CT abdomen pelvis 10/27/2018. FINDINGS: CT CHEST FINDINGS Cardiovascular: Heart is enlarged. Left atrial enlargement. No pericardial effusion. Main pulmonary artery is dilated measuring 3.1 cm. Aortic atherosclerosis. Mediastinum/Nodes: No enlarged axillary, mediastinal or hilar lymphadenopathy. Normal appearance of the esophagus. Lungs/Pleura: Central airways are patent. Biapical pleuroparenchymal thickening and scarring. Subpleural reticular opacities compatible with scarring bilaterally. There is a 4 mm left upper lobe nodule (image 77; series 4). No pleural effusion or pneumothorax. Musculoskeletal: No aggressive or acute appearing osseous lesions. Thoracic spine degenerative changes. CT ABDOMEN PELVIS FINDINGS Hepatobiliary: Liver is normal in size and contour. No focal hepatic lesions identified. Gallbladder is unremarkable. No intrahepatic or extrahepatic biliary ductal dilatation. Pancreas: Unremarkable Spleen: Unremarkable Adrenals/Urinary Tract: Normal adrenal glands. Kidneys enhance symmetrically with contrast. Mild pelviectasis bilaterally. Urinary  bladder is unremarkable. Stomach/Bowel: Sigmoid colonic diverticulosis. No CT evidence for acute diverticulitis. Prior ascending colectomy. Normal morphology of the stomach. No evidence for bowel obstruction. No free fluid or free intraperitoneal air. No  new mass within the splenic flexure potentially on image 94; series 5). Vascular/Lymphatic: Normal caliber abdominal aorta. No retroperitoneal lymphadenopathy. Reproductive: Prior hysterectomy. Other: None. Musculoskeletal: Lumbar spinal fusion hardware. No aggressive or acute appearing osseous lesions. IMPRESSION: 1. Known mass within the splenic flexure is difficult to visualize on current exam. No CT evidence to suggest metastasis within the chest, abdomen or pelvis. 2. Nodule within the left lung measuring up to 4 mm. Recommend continued attention on follow-up. 3. There are bilateral subpleural reticular opacities suggestive of scarring. Consider further evaluation with high-resolution chest CT. 4. Dilated pulmonary artery as can be seen with pulmonary arterial hypertension. Electronically Signed   By: Lovey Newcomer M.D.   On: 04/01/2021 11:03    Assessment/Plan  Adenocarcinoma of the colon Followed by Dr. Benay Spice  next Apt 2/20 No current weight loss or abd pain. She is offered possible surgery and/or chemo See below  Major neurocognitive disorder, due to vascular disease, without behavioral disturbance, mild Continue Exelon per neurology. Due to her cognitive issues, it would be very difficult for her to manage a colostomy, as well as her RA making it difficult to apply the colostomy. [place] have [place] to [place] or skilled. I discussed her care with Dr. Lyndel Safe and we both feel this is a difficult decision and we will support her in anyway but would lean against surgery. Encouraged her to focus on the quality of her life rather than quantity.Relayed this to her daughter Hosie Spangle  Overactive bladder No reported issues, continue Gemtesa  Mixed hyperlipidemia Lab Results  Component Value Date   LDLCALC 49 02/02/2021  Prior hx of CVA Continue lipitor  Iron deficiency anemia Actually has a macrocytosis ALso has slow blood loss.  Hgb is stable Followed by Dr. Benay Spice with  possible myelodysplastic syndrome  Hypothyroidism Lab Results  Component Value Date   TSH 2.11 02/02/2021  Continue Synthroid   Interstitial lung disease ([place]) Has some mild DOE. No other issues. Possibly related to RA Oxygen sats ok. Continue to monitor.   Chronic atrial fibrillation (HCC) Followed by Dr Marlou Porch. On eliquis for CVA risk reduction.  Rate is controlled with digoxin.  HTN (hypertension), benign Controlled with current regimen  Fecal incontinence Recommend dietary modification. She can use prn imodium but due to her memory loss this is difficult to implement. WIll have Claiborne Billings our dietician reach out to her for help. Continues on metamucil which was started for this reason but can be held to see if it helps.  Rheumatoid arthritis ([place]) Follows with Dr. Amil Amen   Labs/tests ordered:  * No order type specified * not ordered as she may have labs done at the cancer center Next appt:   2 months with Dr Lyndel Safe.

## 2021-04-25 ENCOUNTER — Encounter: Payer: Self-pay | Admitting: Adult Health

## 2021-04-25 DIAGNOSIS — F0150 Vascular dementia without behavioral disturbance: Secondary | ICD-10-CM | POA: Insufficient documentation

## 2021-04-25 DIAGNOSIS — F01A0 Vascular dementia, mild, without behavioral disturbance, psychotic disturbance, mood disturbance, and anxiety: Secondary | ICD-10-CM | POA: Insufficient documentation

## 2021-04-25 DIAGNOSIS — C189 Malignant neoplasm of colon, unspecified: Secondary | ICD-10-CM | POA: Insufficient documentation

## 2021-04-25 DIAGNOSIS — R159 Full incontinence of feces: Secondary | ICD-10-CM | POA: Insufficient documentation

## 2021-04-25 NOTE — Assessment & Plan Note (Signed)
Actually has a macrocytosis ALso has slow blood loss.  Hgb is stable Followed by Dr. Benay Spice with possible myelodysplastic syndrome

## 2021-04-25 NOTE — Assessment & Plan Note (Signed)
Lab Results  Component Value Date   TSH 2.11 02/02/2021  Continue Synthroid

## 2021-04-25 NOTE — Assessment & Plan Note (Signed)
Has some mild DOE. No other issues. Possibly related to RA Oxygen sats ok. Continue to monitor.

## 2021-04-25 NOTE — Assessment & Plan Note (Signed)
Followed by Dr Marlou Porch. On eliquis for CVA risk reduction.  Rate is controlled with digoxin.

## 2021-04-25 NOTE — Assessment & Plan Note (Signed)
Lab Results  Component Value Date   LDLCALC 49 02/02/2021  Prior hx of CVA Continue lipitor

## 2021-04-25 NOTE — Assessment & Plan Note (Signed)
Follows with Dr Beekman 

## 2021-04-25 NOTE — Assessment & Plan Note (Signed)
Recommend dietary modification. She can use prn imodium but due to her memory loss this is difficult to implement. WIll have Claiborne Billings our dietician reach out to her for help. Continues on metamucil which was started for this reason but can be held to see if it helps.

## 2021-04-25 NOTE — Assessment & Plan Note (Signed)
No reported issues, continue [place]

## 2021-04-25 NOTE — Assessment & Plan Note (Signed)
Controlled with current regimen  

## 2021-04-25 NOTE — Assessment & Plan Note (Addendum)
Continue Exelon per neurology. Due to her cognitive issues, it would be very difficult for her to manage a colostomy, as well as her RA making it difficult to apply the colostomy. [place] or skilled. I discussed her care with Dr. Lyndel Safe and we both feel this is a difficult decision and we will support her in anyway but would lean against surgery.  Encouraged her to focus on the quality of her life rather than quantity.Relayed this to her daughter Hosie Spangle

## 2021-04-26 DIAGNOSIS — G3184 Mild cognitive impairment, so stated: Secondary | ICD-10-CM | POA: Diagnosis not present

## 2021-04-26 DIAGNOSIS — Z8673 Personal history of transient ischemic attack (TIA), and cerebral infarction without residual deficits: Secondary | ICD-10-CM | POA: Diagnosis not present

## 2021-04-26 DIAGNOSIS — R1313 Dysphagia, pharyngeal phase: Secondary | ICD-10-CM | POA: Diagnosis not present

## 2021-04-26 DIAGNOSIS — R41841 Cognitive communication deficit: Secondary | ICD-10-CM | POA: Diagnosis not present

## 2021-05-10 DIAGNOSIS — G3184 Mild cognitive impairment, so stated: Secondary | ICD-10-CM | POA: Diagnosis not present

## 2021-05-10 DIAGNOSIS — R1313 Dysphagia, pharyngeal phase: Secondary | ICD-10-CM | POA: Diagnosis not present

## 2021-05-10 DIAGNOSIS — R41841 Cognitive communication deficit: Secondary | ICD-10-CM | POA: Diagnosis not present

## 2021-05-10 DIAGNOSIS — Z8673 Personal history of transient ischemic attack (TIA), and cerebral infarction without residual deficits: Secondary | ICD-10-CM | POA: Diagnosis not present

## 2021-05-11 DIAGNOSIS — R278 Other lack of coordination: Secondary | ICD-10-CM | POA: Diagnosis not present

## 2021-05-11 DIAGNOSIS — M6389 Disorders of muscle in diseases classified elsewhere, multiple sites: Secondary | ICD-10-CM | POA: Diagnosis not present

## 2021-05-11 DIAGNOSIS — R152 Fecal urgency: Secondary | ICD-10-CM | POA: Diagnosis not present

## 2021-05-11 DIAGNOSIS — Z85038 Personal history of other malignant neoplasm of large intestine: Secondary | ICD-10-CM | POA: Diagnosis not present

## 2021-05-12 DIAGNOSIS — Z85038 Personal history of other malignant neoplasm of large intestine: Secondary | ICD-10-CM | POA: Diagnosis not present

## 2021-05-12 DIAGNOSIS — M6389 Disorders of muscle in diseases classified elsewhere, multiple sites: Secondary | ICD-10-CM | POA: Diagnosis not present

## 2021-05-12 DIAGNOSIS — R152 Fecal urgency: Secondary | ICD-10-CM | POA: Diagnosis not present

## 2021-05-12 DIAGNOSIS — R278 Other lack of coordination: Secondary | ICD-10-CM | POA: Diagnosis not present

## 2021-05-16 ENCOUNTER — Inpatient Hospital Stay: Payer: Medicare Other | Attending: Oncology | Admitting: Oncology

## 2021-05-16 ENCOUNTER — Other Ambulatory Visit: Payer: Self-pay | Admitting: *Deleted

## 2021-05-16 ENCOUNTER — Inpatient Hospital Stay: Payer: Medicare Other

## 2021-05-16 ENCOUNTER — Other Ambulatory Visit: Payer: Self-pay

## 2021-05-16 VITALS — BP 141/82 | HR 99 | Temp 97.9°F | Resp 18 | Ht 62.0 in | Wt 120.2 lb

## 2021-05-16 DIAGNOSIS — D649 Anemia, unspecified: Secondary | ICD-10-CM

## 2021-05-16 DIAGNOSIS — E039 Hypothyroidism, unspecified: Secondary | ICD-10-CM | POA: Diagnosis not present

## 2021-05-16 DIAGNOSIS — M069 Rheumatoid arthritis, unspecified: Secondary | ICD-10-CM | POA: Insufficient documentation

## 2021-05-16 DIAGNOSIS — Z79899 Other long term (current) drug therapy: Secondary | ICD-10-CM | POA: Diagnosis not present

## 2021-05-16 DIAGNOSIS — C185 Malignant neoplasm of splenic flexure: Secondary | ICD-10-CM | POA: Diagnosis not present

## 2021-05-16 DIAGNOSIS — I4891 Unspecified atrial fibrillation: Secondary | ICD-10-CM | POA: Insufficient documentation

## 2021-05-16 LAB — CBC WITH DIFFERENTIAL (CANCER CENTER ONLY)
Abs Immature Granulocytes: 0.02 10*3/uL (ref 0.00–0.07)
Basophils Absolute: 0.1 10*3/uL (ref 0.0–0.1)
Basophils Relative: 1 %
Eosinophils Absolute: 0.1 10*3/uL (ref 0.0–0.5)
Eosinophils Relative: 1 %
HCT: 28.6 % — ABNORMAL LOW (ref 36.0–46.0)
Hemoglobin: 9.3 g/dL — ABNORMAL LOW (ref 12.0–15.0)
Immature Granulocytes: 0 %
Lymphocytes Relative: 22 %
Lymphs Abs: 1.6 10*3/uL (ref 0.7–4.0)
MCH: 34.4 pg — ABNORMAL HIGH (ref 26.0–34.0)
MCHC: 32.5 g/dL (ref 30.0–36.0)
MCV: 105.9 fL — ABNORMAL HIGH (ref 80.0–100.0)
Monocytes Absolute: 1 10*3/uL (ref 0.1–1.0)
Monocytes Relative: 14 %
Neutro Abs: 4.5 10*3/uL (ref 1.7–7.7)
Neutrophils Relative %: 62 %
Platelet Count: 216 10*3/uL (ref 150–400)
RBC: 2.7 MIL/uL — ABNORMAL LOW (ref 3.87–5.11)
RDW: 16.5 % — ABNORMAL HIGH (ref 11.5–15.5)
WBC Count: 7.2 10*3/uL (ref 4.0–10.5)
nRBC: 0 % (ref 0.0–0.2)

## 2021-05-16 LAB — CMP (CANCER CENTER ONLY)
ALT: 17 U/L (ref 0–44)
AST: 19 U/L (ref 15–41)
Albumin: 4.2 g/dL (ref 3.5–5.0)
Alkaline Phosphatase: 67 U/L (ref 38–126)
Anion gap: 8 (ref 5–15)
BUN: 16 mg/dL (ref 8–23)
CO2: 24 mmol/L (ref 22–32)
Calcium: 9.2 mg/dL (ref 8.9–10.3)
Chloride: 104 mmol/L (ref 98–111)
Creatinine: 0.83 mg/dL (ref 0.44–1.00)
GFR, Estimated: >60 mL/min (ref >60)
Glucose, Bld: 112 mg/dL — ABNORMAL HIGH (ref 70–99)
Potassium: 4.6 mmol/L (ref 3.5–5.1)
Sodium: 136 mmol/L (ref 135–145)
Total Bilirubin: 0.5 mg/dL (ref 0.3–1.2)
Total Protein: 6.4 g/dL — ABNORMAL LOW (ref 6.5–8.1)

## 2021-05-16 LAB — SAMPLE TO BLOOD BANK

## 2021-05-16 LAB — FERRITIN: Ferritin: 26 ng/mL (ref 11–307)

## 2021-05-16 LAB — CEA (ACCESS): CEA (CHCC): 3.83 ng/mL (ref 0.00–5.00)

## 2021-05-16 NOTE — Progress Notes (Signed)
[facility] OFFICE PROGRESS NOTE   Diagnosis: Colon cancer, anemia  INTERVAL HISTORY:   Kathy Velez returns as scheduled.  She is here with her daughter.  No new complaint.  No bleeding.  She has mild exertional dyspnea.  Objective:  Vital signs in last 24 hours:  Blood pressure (!) 141/82, pulse 99, temperature 97.9 F (36.6 C), temperature source Oral, resp. rate 18, height '5\' 2"'  (1.575 m), weight 120 lb 3.2 oz (54.5 kg), SpO2 97 %.    Resp: Lungs clear bilaterally Cardio: Irregular GI: No hepatosplenomegaly, soft, no mass Vascular: No leg edema   Lab Results:  Lab Results  Component Value Date   WBC 6.8 04/17/2021   HGB 9.7 (L) 04/17/2021   HCT 29.8 (L) 04/17/2021   MCV 103.5 (H) 04/17/2021   PLT 229 04/17/2021   NEUTROABS 4.1 04/17/2021    CMP  Lab Results  Component Value Date   NA 137 02/02/2021   K 4.7 02/02/2021   CL 99 02/02/2021   CO2 24 (A) 02/02/2021   GLUCOSE 96 09/07/2020   BUN 14 02/02/2021   CREATININE 0.90 03/31/2021   CALCIUM 9.5 02/02/2021   PROT 6.5 03/29/2020   ALBUMIN 4.1 12/30/2020   AST 21 12/30/2020   ALT 16 12/30/2020   ALKPHOS 96 12/30/2020   BILITOT 0.8 03/29/2020   GFRNONAA 68.46 02/02/2021   GFRAA 79.35 02/02/2021      Medications: I have reviewed the patient's current medications.   Assessment/Plan: Colon cancer-splenic flexure mass on colonoscopy 02/22/2021 Biopsy 02/22/2021-invasive well differentiated adenocarcinoma CTs 03/31/2021-no evidence of metastatic disease, possible mass at the splenic flexure, 4 mm left upper lung nodule, bilateral lung scarring Macrocytic anemia Sessile serrated polyp with focus of high-grade dysplasia on colonoscopy 02/22/2021 Colon cancer 2006, status post a right colectomy-"node positive "cancer per patient, received adjuvant chemotherapy Atrial fibrillation, maintained on anticoagulation History of a CVA and TIA Mild cognitive impairment Rheumatoid  arthritis Hypothyroidism 10.  Chronic lung disease?     Disposition: Kathy Velez appears unchanged.  We discussed the colon cancer diagnosis and treatment options.  She and her daughter have decided against surgery.  Surgery is the only potentially curative therapy.  We discussed observation versus a trial of systemic therapy.  We are waiting on mismatch repair protein testing results to see whether she may be a candidate for immunotherapy.  If the mismatch repair proteins are intact we will not recommend immunotherapy.  We discussed single agent capecitabine and FOLFOX.  We reviewed potential toxicities associated with these regimens.  We discussed the need for Port-A-Cath placement if she were to receive FOLFOX.  It will be challenging to monitor a response to therapy since the tumor was not seen on CT.  We checked a CEA today.  I am concerned it would be difficult for her to tolerate FOLFOX based on her age and comorbid conditions.  She has anemia.  The ferritin level has returned repeatedly normal and the red cells are macrocytic.  Peripheral blood blood smear review last month was suggestive of a bone marrow process such as myelodysplasia.  We discussed the need for a bone marrow biopsy to further define the etiology of her anemia.  Kathy Velez will return to the lab for a CBC, CEA, ferritin, chemistry panel, and blood bank sample today.  She will return for an office visit and further discussion on 05/24/2021.  We will contact her when the mismatch repair protein testing has resulted.  Betsy Coder, MD  05/16/2021  10:33 AM

## 2021-05-16 NOTE — Progress Notes (Signed)
Bone marrow biopsy and aspirate orders placed

## 2021-05-17 DIAGNOSIS — R278 Other lack of coordination: Secondary | ICD-10-CM | POA: Diagnosis not present

## 2021-05-17 DIAGNOSIS — R152 Fecal urgency: Secondary | ICD-10-CM | POA: Diagnosis not present

## 2021-05-17 DIAGNOSIS — Z85038 Personal history of other malignant neoplasm of large intestine: Secondary | ICD-10-CM | POA: Diagnosis not present

## 2021-05-17 DIAGNOSIS — M6389 Disorders of muscle in diseases classified elsewhere, multiple sites: Secondary | ICD-10-CM | POA: Diagnosis not present

## 2021-05-19 DIAGNOSIS — R152 Fecal urgency: Secondary | ICD-10-CM | POA: Diagnosis not present

## 2021-05-19 DIAGNOSIS — M6389 Disorders of muscle in diseases classified elsewhere, multiple sites: Secondary | ICD-10-CM | POA: Diagnosis not present

## 2021-05-19 DIAGNOSIS — R278 Other lack of coordination: Secondary | ICD-10-CM | POA: Diagnosis not present

## 2021-05-19 DIAGNOSIS — Z85038 Personal history of other malignant neoplasm of large intestine: Secondary | ICD-10-CM | POA: Diagnosis not present

## 2021-05-22 ENCOUNTER — Other Ambulatory Visit: Payer: Self-pay | Admitting: *Deleted

## 2021-05-24 ENCOUNTER — Other Ambulatory Visit: Payer: Self-pay | Admitting: *Deleted

## 2021-05-24 ENCOUNTER — Inpatient Hospital Stay: Payer: Medicare Other

## 2021-05-24 ENCOUNTER — Other Ambulatory Visit: Payer: Self-pay

## 2021-05-24 ENCOUNTER — Inpatient Hospital Stay: Payer: Medicare Other | Attending: Oncology | Admitting: Oncology

## 2021-05-24 VITALS — BP 143/78 | HR 90 | Temp 97.7°F | Resp 18 | Ht 62.0 in | Wt 119.8 lb

## 2021-05-24 DIAGNOSIS — Z5112 Encounter for antineoplastic immunotherapy: Secondary | ICD-10-CM | POA: Diagnosis not present

## 2021-05-24 DIAGNOSIS — D649 Anemia, unspecified: Secondary | ICD-10-CM

## 2021-05-24 DIAGNOSIS — C185 Malignant neoplasm of splenic flexure: Secondary | ICD-10-CM | POA: Diagnosis not present

## 2021-05-24 DIAGNOSIS — E039 Hypothyroidism, unspecified: Secondary | ICD-10-CM | POA: Diagnosis not present

## 2021-05-24 LAB — CBC WITH DIFFERENTIAL (CANCER CENTER ONLY)
Abs Immature Granulocytes: 0.03 10*3/uL (ref 0.00–0.07)
Basophils Absolute: 0.1 10*3/uL (ref 0.0–0.1)
Basophils Relative: 1 %
Eosinophils Absolute: 0.1 10*3/uL (ref 0.0–0.5)
Eosinophils Relative: 1 %
HCT: 28.2 % — ABNORMAL LOW (ref 36.0–46.0)
Hemoglobin: 9.1 g/dL — ABNORMAL LOW (ref 12.0–15.0)
Immature Granulocytes: 0 %
Lymphocytes Relative: 20 %
Lymphs Abs: 1.5 10*3/uL (ref 0.7–4.0)
MCH: 33.7 pg (ref 26.0–34.0)
MCHC: 32.3 g/dL (ref 30.0–36.0)
MCV: 104.4 fL — ABNORMAL HIGH (ref 80.0–100.0)
Monocytes Absolute: 1 10*3/uL (ref 0.1–1.0)
Monocytes Relative: 13 %
Neutro Abs: 4.7 10*3/uL (ref 1.7–7.7)
Neutrophils Relative %: 65 %
Platelet Count: 221 10*3/uL (ref 150–400)
RBC: 2.7 MIL/uL — ABNORMAL LOW (ref 3.87–5.11)
RDW: 16.2 % — ABNORMAL HIGH (ref 11.5–15.5)
WBC Count: 7.2 10*3/uL (ref 4.0–10.5)
nRBC: 0 % (ref 0.0–0.2)

## 2021-05-24 LAB — SAMPLE TO BLOOD BANK

## 2021-05-24 LAB — TSH: TSH: 0.22 u[IU]/mL — ABNORMAL LOW (ref 0.350–4.500)

## 2021-05-24 NOTE — Progress Notes (Signed)
?[facility] ?OFFICE PROGRESS NOTE ? ? ?Diagnosis: Colon cancer, anemia ? ?INTERVAL HISTORY:  ? ?Kathy Velez returns as scheduled.  She is here with her daughter.  She has mild exertional dyspnea.  She has intermittent diarrhea.  No bleeding.  No other complaint.  She is taking leflunomide for rheumatoid arthritis. ? ?Objective: ? ?Vital signs in last 24 hours: ? ?Blood pressure (!) 143/78, pulse 90, temperature 97.7 ?F (36.5 ?C), temperature source Oral, resp. rate 18, height _0  (1.575 m), weight 119 lb 12.8 oz (54.3 kg), SpO2 98 %. ?  ? ?Resp: Lungs clear bilaterally ?Cardio: Regular rate and rhythm ?GI: No mass, no hepatosplenomegaly ?Vascular: No leg edema ?Musculoskeletal: Joint deformities at the hands bilaterally ? ?Portacath/PICC-without erythema ? ?Lab Results: ? ?Lab Results  ?Component Value Date  ? WBC 7.2 05/16/2021  ? HGB 9.3 (L) 05/16/2021  ? HCT 28.6 (L) 05/16/2021  ? MCV 105.9 (H) 05/16/2021  ? PLT 216 05/16/2021  ? NEUTROABS 4.5 05/16/2021  ? ? ?CMP  ?Lab Results  ?Component Value Date  ? NA 136 05/16/2021  ? K 4.6 05/16/2021  ? CL 104 05/16/2021  ? CO2 24 05/16/2021  ? GLUCOSE 112 (H) 05/16/2021  ? BUN 16 05/16/2021  ? CREATININE 0.83 05/16/2021  ? CALCIUM 9.2 05/16/2021  ? PROT 6.4 (L) 05/16/2021  ? ALBUMIN 4.2 05/16/2021  ? AST 19 05/16/2021  ? ALT 17 05/16/2021  ? ALKPHOS 67 05/16/2021  ? BILITOT 0.5 05/16/2021  ? GFRNONAA >60 05/16/2021  ? GFRAA 79.35 02/02/2021  ? ? ?Lab Results  ?Component Value Date  ? CEA 3.83 05/16/2021  ? ? ? ?Medications: I have reviewed the patient's current medications. ? ? ?Assessment/Plan: ?Colon cancer-splenic flexure mass on colonoscopy 02/22/2021 ?Biopsy 02/22/2021-invasive well differentiated adenocarcinoma, loss of MLH1 and PMS2 expression ?CTs 03/31/2021-no evidence of metastatic disease, possible mass at the splenic flexure, 4 mm left upper lung nodule, bilateral lung scarring ?Macrocytic anemia ?Sessile serrated polyp with focus of high-grade  dysplasia on colonoscopy 02/22/2021 ?Colon cancer 2006, status post a right colectomy-"node positive "cancer per patient, received adjuvant chemotherapy ?Atrial fibrillation, maintained on anticoagulation ?History of a CVA and TIA ?Mild cognitive impairment ?Rheumatoid arthritis ?Hypothyroidism ?10.  Chronic lung disease? ? ? ? ?Disposition: ?Kathy Velez has been diagnosed with colon cancer.  She appears asymptomatic unless the intermittent diarrhea is in part related to the current tumor.  She has anemia, potentially secondary to "chronic disease ", bleeding from the colon tumor, iron deficiency, and a primary bone marrow process. ? ?We discussed management of the colon cancer and anemia.  She does not appear symptomatic from the anemia and the hemoglobin has not changed significantly over the past several months.  She will increase the ferrous sulfate to twice daily.  The ferritin level has returned in the low normal range over the past few months. ? ?She has decided against surgery for the colon cancer.  She appears to have a an MSI high colon tumor.  I explained the significant clinical response rate in patients with MSI high tumors and metastatic disease.  I also reviewed recent evidence of complete responses in patients with rectal cancer.  She understands immunotherapy is not a "standard "treatment and on resected tumors.  We discussed observation versus a trial of pembrolizumab.  The goal of pembrolizumab is to delay symptomatic progression of the colon tumor and potentially lead to a long-term remission.  She would like to proceed with pembrolizumab. ? ?We reviewed potential toxicities  associated with pembrolizumab including the chance of a rash, diarrhea, and various autoimmune toxicities.  We discussed the potential for worsening of rheumatoid arthritis with pembrolizumab.  I will discuss the case with the Cancer center pharmacy regarding the indication for continuing leflunomide while on treatment with  pembrolizumab. ? ?She will attend a chemotherapy teaching class.  We will asked Dr. Fuller Plan to consider a restaging sigmoidoscopy after 3-4 treatments with pembrolizumab. ? ?She will submit peripheral blood so we can obtain MSI testing.  We will request MLH1 methylation testing on the colon tumor biopsy.  The loss of MLH1 and PMS 2 is likely a reflection of a sporadic tumor. ? ?The plan is to begin pembrolizumab on 06/13/2021. ? ?A treatment plan was entered today. ? ?Kathy Coder, Kathy Velez ? ?05/24/2021  ?10:11 AM ? ? ?

## 2021-05-25 ENCOUNTER — Encounter: Payer: Self-pay | Admitting: *Deleted

## 2021-05-25 ENCOUNTER — Other Ambulatory Visit: Payer: Self-pay | Admitting: *Deleted

## 2021-05-25 ENCOUNTER — Encounter: Payer: Self-pay | Admitting: Internal Medicine

## 2021-05-25 ENCOUNTER — Encounter: Payer: Self-pay | Admitting: Neurology

## 2021-05-25 DIAGNOSIS — Z8673 Personal history of transient ischemic attack (TIA), and cerebral infarction without residual deficits: Secondary | ICD-10-CM | POA: Diagnosis not present

## 2021-05-25 DIAGNOSIS — G3184 Mild cognitive impairment, so stated: Secondary | ICD-10-CM | POA: Diagnosis not present

## 2021-05-25 DIAGNOSIS — R41841 Cognitive communication deficit: Secondary | ICD-10-CM | POA: Diagnosis not present

## 2021-05-25 DIAGNOSIS — R1313 Dysphagia, pharyngeal phase: Secondary | ICD-10-CM | POA: Diagnosis not present

## 2021-05-25 MED ORDER — PROCHLORPERAZINE MALEATE 10 MG PO TABS
10.0000 mg | ORAL_TABLET | Freq: Four times a day (QID) | ORAL | 1 refills | Status: DC | PRN
Start: 2021-05-25 — End: 2021-10-27

## 2021-05-29 ENCOUNTER — Ambulatory Visit: Payer: Medicare Other

## 2021-05-29 ENCOUNTER — Other Ambulatory Visit: Payer: Self-pay

## 2021-05-29 ENCOUNTER — Inpatient Hospital Stay: Payer: Medicare Other

## 2021-05-29 ENCOUNTER — Encounter: Payer: Self-pay | Admitting: Oncology

## 2021-05-29 MED ORDER — RIVASTIGMINE TARTRATE 1.5 MG PO CAPS: 1.5000 mg | ORAL_CAPSULE | Freq: Two times a day (BID) | ORAL | 0 refills | Status: DC

## 2021-05-30 ENCOUNTER — Telehealth: Payer: Self-pay | Admitting: *Deleted

## 2021-05-30 NOTE — Telephone Encounter (Signed)
Notified daughter that Dr. Benay Spice said OK to take 1-2 Imodium/day to try to control number of stools she has/day. Also pharmacist found no drug interactions of Exelon with her rituximab. Also, the hypermethylation testing confirms that her mother's cancer is a sporadic and not hereditary syndrome. ?

## 2021-05-30 NOTE — Telephone Encounter (Signed)
Dr. Benay Spice spoke w/Dr. Amil Amen and has determined that she needs to hold her Enbrel, but OK to continue leflunomide Jolee Ewing). ?Left this information on daughter's voice mail and sent message via myChart as well. ?

## 2021-05-30 NOTE — Telephone Encounter (Signed)
Anti-emetics and labs have been ordered for treatment. Completed pre-tx day. ?

## 2021-05-31 DIAGNOSIS — Z85038 Personal history of other malignant neoplasm of large intestine: Secondary | ICD-10-CM | POA: Diagnosis not present

## 2021-05-31 DIAGNOSIS — M6389 Disorders of muscle in diseases classified elsewhere, multiple sites: Secondary | ICD-10-CM | POA: Diagnosis not present

## 2021-05-31 DIAGNOSIS — R278 Other lack of coordination: Secondary | ICD-10-CM | POA: Diagnosis not present

## 2021-05-31 DIAGNOSIS — R152 Fecal urgency: Secondary | ICD-10-CM | POA: Diagnosis not present

## 2021-06-01 DIAGNOSIS — G3184 Mild cognitive impairment, so stated: Secondary | ICD-10-CM | POA: Diagnosis not present

## 2021-06-01 DIAGNOSIS — R41841 Cognitive communication deficit: Secondary | ICD-10-CM | POA: Diagnosis not present

## 2021-06-01 DIAGNOSIS — Z8673 Personal history of transient ischemic attack (TIA), and cerebral infarction without residual deficits: Secondary | ICD-10-CM | POA: Diagnosis not present

## 2021-06-01 DIAGNOSIS — R1313 Dysphagia, pharyngeal phase: Secondary | ICD-10-CM | POA: Diagnosis not present

## 2021-06-02 DIAGNOSIS — R152 Fecal urgency: Secondary | ICD-10-CM | POA: Diagnosis not present

## 2021-06-02 DIAGNOSIS — M6389 Disorders of muscle in diseases classified elsewhere, multiple sites: Secondary | ICD-10-CM | POA: Diagnosis not present

## 2021-06-02 DIAGNOSIS — R278 Other lack of coordination: Secondary | ICD-10-CM | POA: Diagnosis not present

## 2021-06-02 DIAGNOSIS — Z85038 Personal history of other malignant neoplasm of large intestine: Secondary | ICD-10-CM | POA: Diagnosis not present

## 2021-06-07 DIAGNOSIS — Z85038 Personal history of other malignant neoplasm of large intestine: Secondary | ICD-10-CM | POA: Diagnosis not present

## 2021-06-07 DIAGNOSIS — M6389 Disorders of muscle in diseases classified elsewhere, multiple sites: Secondary | ICD-10-CM | POA: Diagnosis not present

## 2021-06-07 DIAGNOSIS — R278 Other lack of coordination: Secondary | ICD-10-CM | POA: Diagnosis not present

## 2021-06-07 DIAGNOSIS — R152 Fecal urgency: Secondary | ICD-10-CM | POA: Diagnosis not present

## 2021-06-08 ENCOUNTER — Other Ambulatory Visit: Payer: Medicare Other

## 2021-06-08 ENCOUNTER — Ambulatory Visit: Payer: Medicare Other

## 2021-06-09 ENCOUNTER — Other Ambulatory Visit: Payer: Self-pay | Admitting: Oncology

## 2021-06-09 DIAGNOSIS — D649 Anemia, unspecified: Secondary | ICD-10-CM

## 2021-06-12 DIAGNOSIS — Z20822 Contact with and (suspected) exposure to covid-19: Secondary | ICD-10-CM | POA: Diagnosis not present

## 2021-06-12 DIAGNOSIS — R152 Fecal urgency: Secondary | ICD-10-CM | POA: Diagnosis not present

## 2021-06-12 DIAGNOSIS — R278 Other lack of coordination: Secondary | ICD-10-CM | POA: Diagnosis not present

## 2021-06-12 DIAGNOSIS — Z85038 Personal history of other malignant neoplasm of large intestine: Secondary | ICD-10-CM | POA: Diagnosis not present

## 2021-06-12 DIAGNOSIS — M6389 Disorders of muscle in diseases classified elsewhere, multiple sites: Secondary | ICD-10-CM | POA: Diagnosis not present

## 2021-06-13 ENCOUNTER — Inpatient Hospital Stay: Payer: Medicare Other

## 2021-06-13 ENCOUNTER — Other Ambulatory Visit: Payer: Self-pay

## 2021-06-13 VITALS — BP 131/66 | HR 79 | Temp 98.1°F | Resp 18 | Ht 62.0 in | Wt 121.0 lb

## 2021-06-13 DIAGNOSIS — Z5112 Encounter for antineoplastic immunotherapy: Secondary | ICD-10-CM | POA: Diagnosis not present

## 2021-06-13 DIAGNOSIS — D649 Anemia, unspecified: Secondary | ICD-10-CM | POA: Diagnosis not present

## 2021-06-13 DIAGNOSIS — C185 Malignant neoplasm of splenic flexure: Secondary | ICD-10-CM

## 2021-06-13 DIAGNOSIS — E039 Hypothyroidism, unspecified: Secondary | ICD-10-CM | POA: Diagnosis not present

## 2021-06-13 LAB — CBC WITH DIFFERENTIAL (CANCER CENTER ONLY)
Abs Immature Granulocytes: 0.02 10*3/uL (ref 0.00–0.07)
Basophils Absolute: 0.1 10*3/uL (ref 0.0–0.1)
Basophils Relative: 1 %
Eosinophils Absolute: 0.1 10*3/uL (ref 0.0–0.5)
Eosinophils Relative: 1 %
HCT: 26.6 % — ABNORMAL LOW (ref 36.0–46.0)
Hemoglobin: 8.7 g/dL — ABNORMAL LOW (ref 12.0–15.0)
Immature Granulocytes: 0 %
Lymphocytes Relative: 20 %
Lymphs Abs: 1.3 10*3/uL (ref 0.7–4.0)
MCH: 34.4 pg — ABNORMAL HIGH (ref 26.0–34.0)
MCHC: 32.7 g/dL (ref 30.0–36.0)
MCV: 105.1 fL — ABNORMAL HIGH (ref 80.0–100.0)
Monocytes Absolute: 0.9 10*3/uL (ref 0.1–1.0)
Monocytes Relative: 13 %
Neutro Abs: 4.2 10*3/uL (ref 1.7–7.7)
Neutrophils Relative %: 65 %
Platelet Count: 217 10*3/uL (ref 150–400)
RBC: 2.53 MIL/uL — ABNORMAL LOW (ref 3.87–5.11)
RDW: 15.6 % — ABNORMAL HIGH (ref 11.5–15.5)
WBC Count: 6.6 10*3/uL (ref 4.0–10.5)
nRBC: 0 % (ref 0.0–0.2)

## 2021-06-13 LAB — TSH: TSH: 0.894 u[IU]/mL (ref 0.350–4.500)

## 2021-06-13 MED ORDER — SODIUM CHLORIDE 0.9 % IV SOLN
200.0000 mg | Freq: Once | INTRAVENOUS | Status: AC
  Administered 2021-06-13: 200 mg via INTRAVENOUS
  Filled 2021-06-13: qty 8

## 2021-06-13 MED ORDER — SODIUM CHLORIDE 0.9 % IV SOLN: Freq: Once | INTRAVENOUS | Status: AC

## 2021-06-13 NOTE — Progress Notes (Signed)
Pharmacist Chemotherapy Monitoring - Initial Assessment   ? ?Anticipated start date: 06/13/21  ? ?The following has been reviewed per standard work regarding the patient's treatment regimen: ?The patient's diagnosis, treatment plan and drug doses, and organ/hematologic function ?Lab orders and baseline tests specific to treatment regimen  ?The treatment plan start date, drug sequencing, and pre-medications ?Prior authorization status  ?Patient's documented medication list, including drug-drug interaction screen and prescriptions for anti-emetics and supportive care specific to the treatment regimen ?The drug concentrations, fluid compatibility, administration routes, and timing of the medications to be used ?The patient's access for treatment and lifetime cumulative dose history, if applicable  ?The patient's medication allergies and previous infusion related reactions, if applicable  ? ?Changes made to treatment plan:  ?N/A ? ?Follow up needed:  ?N/A ? ? ?Kathy Velez, [facility], ?06/13/2021  1:11 PM  ?

## 2021-06-13 NOTE — Progress Notes (Signed)
Patient presents for treatment. RN assessment completed along with the following: ? ?Labs/vitals reviewed - Yes, and per Dr. Benay Spice, ok to proceed with CMP from 05/16/21    ?Weight within 10% of previous measurement - Yes ?Informed consent completed and reflects current therapy/intent - Yes, on date 06/13/21             ?Provider progress note reviewed - Patient not seen by provider today. Most recent note dated 05/24/21 reviewed. ?Treatment/Antibody/Supportive plan reviewed - Yes, and there are no adjustments needed for today's treatment. ?S&H and other orders reviewed - Yes, and there are no additional orders identified. ?Previous treatment date reviewed -  This is patient's first treatment.  ? ?Patient to proceed with treatment.   ?

## 2021-06-13 NOTE — Patient Instructions (Addendum)
[facility]  Discharge Instructions: ?Thank you for choosing [facility] to provide your oncology and hematology care.  ? ?If you have a lab appointment with the [facility], please go directly to the [facility] and check in at the registration area. ?  ?Wear comfortable clothing and clothing appropriate for easy access to any Portacath or PICC line.  ? ?We strive to give you quality time with your provider. You may need to reschedule your appointment if you arrive late (15 or more minutes).  Arriving late affects you and other patients whose appointments are after yours.  Also, if you miss three or more appointments without notifying the office, you may be dismissed from the clinic at the provider?s discretion.    ?  ?For prescription refill requests, have your pharmacy contact our office and allow 72 hours for refills to be completed.   ? ?Today you received the following chemotherapy and/or immunotherapy agents : pembrolizumab    ? ?Pembrolizumab injection ?What is this medication? ?PEMBROLIZUMAB (pem broe liz ue mab) is a monoclonal antibody. It is used to treat certain types of cancer. ?This medicine may be used for other purposes; ask your health care provider or pharmacist if you have questions. ?COMMON BRAND NAME(S): Keytruda ?What should I tell my care team before I take this medication? ?They need to know if you have any of these conditions: ?autoimmune diseases like Crohn's disease, ulcerative colitis, or lupus ?have had or planning to have an allogeneic stem cell transplant (uses someone else's stem cells) ?history of organ transplant ?history of chest radiation ?nervous system problems like myasthenia gravis or Guillain-Barre syndrome ?an unusual or allergic reaction to pembrolizumab, other medicines, foods, dyes, or preservatives ?pregnant or trying to get pregnant ?breast-feeding ?How should I use this medication? ?This medicine is for infusion into a  vein. It is given by a health care professional in a hospital or clinic setting. ?A special MedGuide will be given to you before each treatment. Be sure to read this information carefully each time. ?Talk to your pediatrician regarding the use of this medicine in children. While this drug may be prescribed for children as young as 6 months for selected conditions, precautions do apply. ?Overdosage: If you think you have taken too much of this medicine contact a poison control center or emergency room at once. ?NOTE: This medicine is only for you. Do not share this medicine with others. ?What if I miss a dose? ?It is important not to miss your dose. Call your doctor or health care professional if you are unable to keep an appointment. ?What may interact with this medication? ?Interactions have not been studied. ?This list may not describe all possible interactions. Give your health care provider a list of all the medicines, herbs, non-prescription drugs, or dietary supplements you use. Also tell them if you smoke, drink alcohol, or use illegal drugs. Some items may interact with your medicine. ?What should I watch for while using this medication? ?Your condition will be monitored carefully while you are receiving this medicine. ?You may need blood work done while you are taking this medicine. ?Do not become pregnant while taking this medicine or for 4 months after stopping it. Women should inform their doctor if they wish to become pregnant or think they might be pregnant. There is a potential for serious side effects to an unborn child. Talk to your health care professional or pharmacist for more information. Do not breast-feed an  infant while taking this medicine or for 4 months after the last dose. ?What side effects may I notice from receiving this medication? ?Side effects that you should report to your doctor or health care professional as soon as possible: ?allergic reactions like skin rash, itching or hives,  swelling of the face, lips, or tongue ?bloody or black, tarry ?breathing problems ?changes in vision ?chest pain ?chills ?confusion ?constipation ?cough ?diarrhea ?dizziness or feeling faint or lightheaded ?fast or irregular heartbeat ?fever ?flushing ?joint pain ?low blood counts - this medicine may decrease the number of white blood cells, red blood cells and platelets. You may be at increased risk for infections and bleeding. ?muscle pain ?muscle weakness ?pain, tingling, numbness in the hands or feet ?persistent headache ?redness, blistering, peeling or loosening of the skin, including inside the mouth ?signs and symptoms of high blood sugar such as dizziness; dry mouth; dry skin; fruity breath; nausea; stomach pain; increased hunger or thirst; increased urination ?signs and symptoms of kidney injury like trouble passing urine or change in the amount of urine ?signs and symptoms of liver injury like dark urine, light-colored stools, loss of appetite, nausea, right upper belly pain, yellowing of the eyes or skin ?sweating ?swollen lymph nodes ?weight loss ?Side effects that usually do not require medical attention (report to your doctor or health care professional if they continue or are bothersome): ?decreased appetite ?hair loss ?tiredness ?This list may not describe all possible side effects. Call your doctor for medical advice about side effects. You may report side effects to FDA at 1-800-FDA-1088. ?Where should I keep my medication? ?This drug is given in a hospital or clinic and will not be stored at home. ?NOTE: This sheet is a summary. It may not cover all possible information. If you have questions about this medicine, talk to your doctor, pharmacist, or health care provider. ?? 2022 Elsevier/Gold Standard (2020-11-29 00:00:00) ? ?  ?To help prevent nausea and vomiting after your treatment, we encourage you to take your nausea medication as directed. ? ?BELOW ARE SYMPTOMS THAT SHOULD BE REPORTED  IMMEDIATELY: ?*FEVER GREATER THAN 100.4 F (38 ?C) OR HIGHER ?*CHILLS OR SWEATING ?*NAUSEA AND VOMITING THAT IS NOT CONTROLLED WITH YOUR NAUSEA MEDICATION ?*UNUSUAL SHORTNESS OF BREATH ?*UNUSUAL BRUISING OR BLEEDING ?*URINARY PROBLEMS (pain or burning when urinating, or frequent urination) ?*BOWEL PROBLEMS (unusual diarrhea, constipation, pain near the anus) ?TENDERNESS IN MOUTH AND THROAT WITH OR WITHOUT PRESENCE OF ULCERS (sore throat, sores in mouth, or a toothache) ?UNUSUAL RASH, SWELLING OR PAIN  ?UNUSUAL VAGINAL DISCHARGE OR ITCHING  ? ?Items with * indicate a potential emergency and should be followed up as soon as possible or go to the Emergency Department if any problems should occur. ? ?Please show the CHEMOTHERAPY ALERT CARD or IMMUNOTHERAPY ALERT CARD at check-in to the Emergency Department and triage nurse. ? ?Should you have questions after your visit or need to cancel or reschedule your appointment, please contact [facility]  Dept: [phone]  and follow the prompts.  Office hours are 8:00 a.m. to 4:30 p.m. Monday - Friday. Please note that voicemails left after 4:00 p.m. may not be returned until the following business day.  We are closed weekends and major holidays. You have access to a nurse at all times for urgent questions. Please call the main number to the clinic Dept: [phone] and follow the prompts. ? ? ?For any non-urgent questions, you may also contact your provider using MyChart. We now offer e-Visits  for anyone 12 and older to request care online for non-urgent symptoms. For details visit [email or web address]. ?  ?Also download the MyChart app! Go to the app store, search "MyChart", open the app, select [facility], and log in with your MyChart username and password. ? ?Due to Covid, a mask is required upon entering the hospital/clinic. If you do not have a mask, one will be given to you upon arrival. For doctor visits, patients may have 1 support  person aged 68 or older with them. For treatment visits, patients cannot have anyone with them due to current Covid guidelines and our immunocompromised population.  ? ?

## 2021-06-14 ENCOUNTER — Telehealth: Payer: Self-pay

## 2021-06-14 ENCOUNTER — Other Ambulatory Visit: Payer: Self-pay | Admitting: Adult Health

## 2021-06-14 ENCOUNTER — Telehealth: Payer: Self-pay | Admitting: *Deleted

## 2021-06-14 DIAGNOSIS — C185 Malignant neoplasm of splenic flexure: Secondary | ICD-10-CM

## 2021-06-14 DIAGNOSIS — M6389 Disorders of muscle in diseases classified elsewhere, multiple sites: Secondary | ICD-10-CM | POA: Diagnosis not present

## 2021-06-14 DIAGNOSIS — Z85038 Personal history of other malignant neoplasm of large intestine: Secondary | ICD-10-CM | POA: Diagnosis not present

## 2021-06-14 DIAGNOSIS — D649 Anemia, unspecified: Secondary | ICD-10-CM

## 2021-06-14 DIAGNOSIS — I482 Chronic atrial fibrillation, unspecified: Secondary | ICD-10-CM

## 2021-06-14 DIAGNOSIS — R278 Other lack of coordination: Secondary | ICD-10-CM | POA: Diagnosis not present

## 2021-06-14 DIAGNOSIS — R152 Fecal urgency: Secondary | ICD-10-CM | POA: Diagnosis not present

## 2021-06-14 NOTE — Telephone Encounter (Signed)
Called and spoke to patient for first time chemo follow-up.  Patient states she has felt fine.  She stated she did feel a little more tired today, but denies any other side effects.  Informed patient tiredness is a common side effect.  Instructed patient to call office with any questions or concerns and confirmed her upcoming appointment.  Patient verbalized understanding. All questions were answered during phone call.  ?

## 2021-06-14 NOTE — Telephone Encounter (Signed)
-----   Message from Ladell Pier, MD sent at 06/13/2021  5:12 PM EDT ----- ?Please call daughter, hemoglobin is slightly lower today, repeat CBC and blood bank sample next visit, we will consider a Red cell transfusion if the hemoglobin returns lower ? ?

## 2021-06-14 NOTE — Telephone Encounter (Signed)
Notified daughter of decrease in Hgb and will re-check next visit with blood bank sample and consider transfusion if lower. She understands and agrees. ?

## 2021-06-15 DIAGNOSIS — R41841 Cognitive communication deficit: Secondary | ICD-10-CM | POA: Diagnosis not present

## 2021-06-15 DIAGNOSIS — G3184 Mild cognitive impairment, so stated: Secondary | ICD-10-CM | POA: Diagnosis not present

## 2021-06-15 DIAGNOSIS — R1313 Dysphagia, pharyngeal phase: Secondary | ICD-10-CM | POA: Diagnosis not present

## 2021-06-15 DIAGNOSIS — Z8673 Personal history of transient ischemic attack (TIA), and cerebral infarction without residual deficits: Secondary | ICD-10-CM | POA: Diagnosis not present

## 2021-06-22 DIAGNOSIS — G3184 Mild cognitive impairment, so stated: Secondary | ICD-10-CM | POA: Diagnosis not present

## 2021-06-22 DIAGNOSIS — R152 Fecal urgency: Secondary | ICD-10-CM | POA: Diagnosis not present

## 2021-06-22 DIAGNOSIS — Z8673 Personal history of transient ischemic attack (TIA), and cerebral infarction without residual deficits: Secondary | ICD-10-CM | POA: Diagnosis not present

## 2021-06-22 DIAGNOSIS — R278 Other lack of coordination: Secondary | ICD-10-CM | POA: Diagnosis not present

## 2021-06-22 DIAGNOSIS — Z85038 Personal history of other malignant neoplasm of large intestine: Secondary | ICD-10-CM | POA: Diagnosis not present

## 2021-06-22 DIAGNOSIS — M6389 Disorders of muscle in diseases classified elsewhere, multiple sites: Secondary | ICD-10-CM | POA: Diagnosis not present

## 2021-06-22 DIAGNOSIS — R1313 Dysphagia, pharyngeal phase: Secondary | ICD-10-CM | POA: Diagnosis not present

## 2021-06-22 DIAGNOSIS — R41841 Cognitive communication deficit: Secondary | ICD-10-CM | POA: Diagnosis not present

## 2021-06-27 DIAGNOSIS — Z20822 Contact with and (suspected) exposure to covid-19: Secondary | ICD-10-CM | POA: Diagnosis not present

## 2021-06-28 ENCOUNTER — Encounter: Payer: Medicare Other | Admitting: Internal Medicine

## 2021-06-28 DIAGNOSIS — R278 Other lack of coordination: Secondary | ICD-10-CM | POA: Diagnosis not present

## 2021-06-28 DIAGNOSIS — M6389 Disorders of muscle in diseases classified elsewhere, multiple sites: Secondary | ICD-10-CM | POA: Diagnosis not present

## 2021-06-28 DIAGNOSIS — Z85038 Personal history of other malignant neoplasm of large intestine: Secondary | ICD-10-CM | POA: Diagnosis not present

## 2021-06-28 DIAGNOSIS — R152 Fecal urgency: Secondary | ICD-10-CM | POA: Diagnosis not present

## 2021-06-29 ENCOUNTER — Ambulatory Visit: Payer: Medicare Other

## 2021-06-29 ENCOUNTER — Other Ambulatory Visit: Payer: Medicare Other

## 2021-06-29 ENCOUNTER — Ambulatory Visit: Payer: Medicare Other | Admitting: Internal Medicine

## 2021-06-29 ENCOUNTER — Ambulatory Visit: Payer: Medicare Other | Admitting: Oncology

## 2021-07-01 ENCOUNTER — Other Ambulatory Visit: Payer: Self-pay | Admitting: Oncology

## 2021-07-04 ENCOUNTER — Encounter: Payer: Self-pay | Admitting: *Deleted

## 2021-07-04 ENCOUNTER — Inpatient Hospital Stay: Payer: Medicare Other

## 2021-07-04 ENCOUNTER — Inpatient Hospital Stay: Payer: Medicare Other | Attending: Oncology

## 2021-07-04 ENCOUNTER — Inpatient Hospital Stay (HOSPITAL_BASED_OUTPATIENT_CLINIC_OR_DEPARTMENT_OTHER): Payer: Medicare Other | Admitting: Oncology

## 2021-07-04 VITALS — BP 113/72 | HR 90 | Temp 97.7°F | Resp 18 | Ht 62.0 in | Wt 121.6 lb

## 2021-07-04 VITALS — BP 140/73 | HR 80

## 2021-07-04 DIAGNOSIS — E039 Hypothyroidism, unspecified: Secondary | ICD-10-CM | POA: Diagnosis not present

## 2021-07-04 DIAGNOSIS — G3184 Mild cognitive impairment, so stated: Secondary | ICD-10-CM | POA: Insufficient documentation

## 2021-07-04 DIAGNOSIS — C185 Malignant neoplasm of splenic flexure: Secondary | ICD-10-CM | POA: Insufficient documentation

## 2021-07-04 DIAGNOSIS — M069 Rheumatoid arthritis, unspecified: Secondary | ICD-10-CM | POA: Diagnosis not present

## 2021-07-04 DIAGNOSIS — Z8673 Personal history of transient ischemic attack (TIA), and cerebral infarction without residual deficits: Secondary | ICD-10-CM | POA: Insufficient documentation

## 2021-07-04 DIAGNOSIS — D649 Anemia, unspecified: Secondary | ICD-10-CM | POA: Diagnosis not present

## 2021-07-04 DIAGNOSIS — Z5112 Encounter for antineoplastic immunotherapy: Secondary | ICD-10-CM | POA: Diagnosis not present

## 2021-07-04 DIAGNOSIS — Z7901 Long term (current) use of anticoagulants: Secondary | ICD-10-CM | POA: Insufficient documentation

## 2021-07-04 DIAGNOSIS — I4891 Unspecified atrial fibrillation: Secondary | ICD-10-CM | POA: Insufficient documentation

## 2021-07-04 LAB — CBC WITH DIFFERENTIAL (CANCER CENTER ONLY)
Abs Immature Granulocytes: 0.02 10*3/uL (ref 0.00–0.07)
Basophils Absolute: 0.1 10*3/uL (ref 0.0–0.1)
Basophils Relative: 1 %
Eosinophils Absolute: 0.1 10*3/uL (ref 0.0–0.5)
Eosinophils Relative: 2 %
HCT: 30.6 % — ABNORMAL LOW (ref 36.0–46.0)
Hemoglobin: 10 g/dL — ABNORMAL LOW (ref 12.0–15.0)
Immature Granulocytes: 0 %
Lymphocytes Relative: 20 %
Lymphs Abs: 1.3 10*3/uL (ref 0.7–4.0)
MCH: 34.5 pg — ABNORMAL HIGH (ref 26.0–34.0)
MCHC: 32.7 g/dL (ref 30.0–36.0)
MCV: 105.5 fL — ABNORMAL HIGH (ref 80.0–100.0)
Monocytes Absolute: 1 10*3/uL (ref 0.1–1.0)
Monocytes Relative: 15 %
Neutro Abs: 3.9 10*3/uL (ref 1.7–7.7)
Neutrophils Relative %: 62 %
Platelet Count: 203 10*3/uL (ref 150–400)
RBC: 2.9 MIL/uL — ABNORMAL LOW (ref 3.87–5.11)
RDW: 14.9 % (ref 11.5–15.5)
WBC Count: 6.4 10*3/uL (ref 4.0–10.5)
nRBC: 0 % (ref 0.0–0.2)

## 2021-07-04 LAB — CMP (CANCER CENTER ONLY)
ALT: 18 U/L (ref 0–44)
AST: 21 U/L (ref 15–41)
Albumin: 4.3 g/dL (ref 3.5–5.0)
Alkaline Phosphatase: 107 U/L (ref 38–126)
Anion gap: 8 (ref 5–15)
BUN: 22 mg/dL (ref 8–23)
CO2: 24 mmol/L (ref 22–32)
Calcium: 9.3 mg/dL (ref 8.9–10.3)
Chloride: 102 mmol/L (ref 98–111)
Creatinine: 0.81 mg/dL (ref 0.44–1.00)
GFR, Estimated: >60 mL/min (ref >60)
Glucose, Bld: 89 mg/dL (ref 70–99)
Potassium: 4 mmol/L (ref 3.5–5.1)
Sodium: 134 mmol/L — ABNORMAL LOW (ref 135–145)
Total Bilirubin: 0.6 mg/dL (ref 0.3–1.2)
Total Protein: 6.7 g/dL (ref 6.5–8.1)

## 2021-07-04 LAB — SAMPLE TO BLOOD BANK

## 2021-07-04 MED ORDER — SODIUM CHLORIDE 0.9 % IV SOLN: Freq: Once | INTRAVENOUS | Status: AC

## 2021-07-04 MED ORDER — SODIUM CHLORIDE 0.9 % IV SOLN
200.0000 mg | Freq: Once | INTRAVENOUS | Status: AC
  Administered 2021-07-04: 200 mg via INTRAVENOUS
  Filled 2021-07-04: qty 8

## 2021-07-04 NOTE — Progress Notes (Signed)
?  [name] ?OFFICE PROGRESS NOTE ? ? ?Diagnosis: Colon cancer, anemia ? ?INTERVAL HISTORY:  ? ?Kathy Velez completed cycle 1 pembrolizumab 06/13/2021.  She tolerated the treatment well.  No rash or diarrhea.  She has stiffness in the mornings.  No change in arthritis symptoms.  She continues exercise classes.  She has occasional nausea.  No bleeding. ? ?Objective: ? ?Vital signs in last 24 hours: ? ?Blood pressure 113/72, pulse 90, temperature 97.7 ?F (36.5 ?C), temperature source Oral, resp. rate 18, height $RemoveBe'5\' 2"'ehbvdPqhh$  (1.575 m), weight 121 lb 9.6 oz (55.2 kg), SpO2 100 %. ?  ? ?Resp: Lungs clear bilaterally ?Cardio: Irregular ?GI: Nontender, no mass, no hepatosplenomegaly ?Vascular: No leg edema  ?Skin: No rash ? ?Portacath/PICC-without erythema ? ?Lab Results: ? ?Lab Results  ?Component Value Date  ? WBC 6.4 07/04/2021  ? HGB 10.0 (L) 07/04/2021  ? HCT 30.6 (L) 07/04/2021  ? MCV 105.5 (H) 07/04/2021  ? PLT 203 07/04/2021  ? NEUTROABS 3.9 07/04/2021  ? ? ?CMP  ?Lab Results  ?Component Value Date  ? NA 136 05/16/2021  ? K 4.6 05/16/2021  ? CL 104 05/16/2021  ? CO2 24 05/16/2021  ? GLUCOSE 112 (H) 05/16/2021  ? BUN 16 05/16/2021  ? CREATININE 0.83 05/16/2021  ? CALCIUM 9.2 05/16/2021  ? PROT 6.4 (L) 05/16/2021  ? ALBUMIN 4.2 05/16/2021  ? AST 19 05/16/2021  ? ALT 17 05/16/2021  ? ALKPHOS 67 05/16/2021  ? BILITOT 0.5 05/16/2021  ? GFRNONAA >60 05/16/2021  ? GFRAA 79.35 02/02/2021  ? ? ?Lab Results  ?Component Value Date  ? CEA 3.83 05/16/2021  ? ? ?Medications: I have reviewed the patient's current medications. ? ? ?Assessment/Plan: ?Colon cancer-splenic flexure mass on colonoscopy 02/22/2021 ?Biopsy 02/22/2021-invasive well differentiated adenocarcinoma, loss of MLH1 and PMS2 expression, MSI high, MLH1 hyper methylation present ?CTs 03/31/2021-no evidence of metastatic disease, possible mass at the splenic flexure, 4 mm left upper lung nodule, bilateral lung scarring ?Cycle 1 pembrolizumab 06/13/2021 ?Cycle 2  pembrolizumab 07/04/2021 ?Macrocytic anemia ?Sessile serrated polyp with focus of high-grade dysplasia on colonoscopy 02/22/2021 ?Colon cancer 2006, status post a right colectomy-"node positive "cancer per patient, received adjuvant chemotherapy ?Atrial fibrillation, maintained on anticoagulation ?History of a CVA and TIA ?Mild cognitive impairment ?Rheumatoid arthritis ?Hypothyroidism ?10.  Chronic lung disease? ? ? ? ? ?Disposition: ?Kathy Velez appears stable.  She tolerated the first treatment with pembrolizumab well.  No change in her rheumatoid arthritis symptoms.  She will complete cycle 2 today.  The hemoglobin is higher today.  She will return for an office visit in the next cycle of pembrolizumab in 3 weeks. ? ?Betsy Coder, MD ? ?07/04/2021  ?10:13 AM ? ? ?

## 2021-07-04 NOTE — Progress Notes (Signed)
Patient seen by Dr. Sherrill today ? ?Vitals are within treatment parameters. ? ?Labs reviewed by Dr. Sherrill and are within treatment parameters. ? ?Per physician team, patient is ready for treatment and there are NO modifications to the treatment plan.  ?

## 2021-07-04 NOTE — Patient Instructions (Signed)
[facility]   °Discharge Instructions: °Thank you for choosing [facility] to provide your oncology and hematology care.  ° °If you have a lab appointment with the Cancer Center, please go directly to the Cancer Center and check in at the registration area. °  °Wear comfortable clothing and clothing appropriate for easy access to any Portacath or PICC line.  ° °We strive to give you quality time with your provider. You may need to reschedule your appointment if you arrive late (15 or more minutes).  Arriving late affects you and other patients whose appointments are after yours.  Also, if you miss three or more appointments without notifying the office, you may be dismissed from the clinic at the provider’s discretion.    °  °For prescription refill requests, have your pharmacy contact our office and allow 72 hours for refills to be completed.   ° °Today you received the following chemotherapy and/or immunotherapy agents Pembrolizumab (KEYTRUDA).    °  °To help prevent nausea and vomiting after your treatment, we encourage you to take your nausea medication as directed. ° °BELOW ARE SYMPTOMS THAT SHOULD BE REPORTED IMMEDIATELY: °*FEVER GREATER THAN 100.4 F (38 °C) OR HIGHER °*CHILLS OR SWEATING °*NAUSEA AND VOMITING THAT IS NOT CONTROLLED WITH YOUR NAUSEA MEDICATION °*UNUSUAL SHORTNESS OF BREATH °*UNUSUAL BRUISING OR BLEEDING °*URINARY PROBLEMS (pain or burning when urinating, or frequent urination) °*BOWEL PROBLEMS (unusual diarrhea, constipation, pain near the anus) °TENDERNESS IN MOUTH AND THROAT WITH OR WITHOUT PRESENCE OF ULCERS (sore throat, sores in mouth, or a toothache) °UNUSUAL RASH, SWELLING OR PAIN  °UNUSUAL VAGINAL DISCHARGE OR ITCHING  ° °Items with * indicate a potential emergency and should be followed up as soon as possible or go to the Emergency Department if any problems should occur. ° °Please show the CHEMOTHERAPY ALERT CARD or IMMUNOTHERAPY ALERT CARD  at check-in to the Emergency Department and triage nurse. ° °Should you have questions after your visit or need to cancel or reschedule your appointment, please contact [facility]  Dept: [phone]  and follow the prompts.  Office hours are 8:00 a.m. to 4:30 p.m. Monday - Friday. Please note that voicemails left after 4:00 p.m. may not be returned until the following business day.  We are closed weekends and major holidays. You have access to a nurse at all times for urgent questions. Please call the main number to the clinic Dept: [phone] and follow the prompts. ° ° °For any non-urgent questions, you may also contact your provider using MyChart. We now offer e-Visits for anyone 18 and older to request care online for non-urgent symptoms. For details visit [email or web address]. °  °Also download the MyChart app! Go to the app store, search "MyChart", open the app, select [facility], and log in with your MyChart username and password. ° °Due to Covid, a mask is required upon entering the hospital/clinic. If you do not have a mask, one will be given to you upon arrival. For doctor visits, patients may have 1 support person aged 18 or older with them. For treatment visits, patients cannot have anyone with them due to current Covid guidelines and our immunocompromised population.  ° °Pembrolizumab injection °What is this medication? °PEMBROLIZUMAB (pem broe liz ue mab) is a monoclonal antibody. It is used to treat certain types of cancer. °This medicine may be used for other purposes; ask your health care provider or pharmacist if you have questions. °COMMON BRAND NAME(S):   Keytruda °What should I tell my care team before I take this medication? °They need to know if you have any of these conditions: °autoimmune diseases like Crohn's disease, ulcerative colitis, or lupus °have had or planning to have an allogeneic stem cell transplant (uses someone else's stem cells) °history of  organ transplant °history of chest radiation °nervous system problems like myasthenia gravis or Guillain-Barre syndrome °an unusual or allergic reaction to pembrolizumab, other medicines, foods, dyes, or preservatives °pregnant or trying to get pregnant °breast-feeding °How should I use this medication? °This medicine is for infusion into a vein. It is given by a health care professional in a hospital or clinic setting. °A special MedGuide will be given to you before each treatment. Be sure to read this information carefully each time. °Talk to your pediatrician regarding the use of this medicine in children. While this drug may be prescribed for children as young as 6 months for selected conditions, precautions do apply. °Overdosage: If you think you have taken too much of this medicine contact a poison control center or emergency room at once. °NOTE: This medicine is only for you. Do not share this medicine with others. °What if I miss a dose? °It is important not to miss your dose. Call your doctor or health care professional if you are unable to keep an appointment. °What may interact with this medication? °Interactions have not been studied. °This list may not describe all possible interactions. Give your health care provider a list of all the medicines, herbs, non-prescription drugs, or dietary supplements you use. Also tell them if you smoke, drink alcohol, or use illegal drugs. Some items may interact with your medicine. °What should I watch for while using this medication? °Your condition will be monitored carefully while you are receiving this medicine. °You may need blood work done while you are taking this medicine. °Do not become pregnant while taking this medicine or for 4 months after stopping it. Women should inform their doctor if they wish to become pregnant or think they might be pregnant. There is a potential for serious side effects to an unborn child. Talk to your health care professional or  pharmacist for more information. Do not breast-feed an infant while taking this medicine or for 4 months after the last dose. °What side effects may I notice from receiving this medication? °Side effects that you should report to your doctor or health care professional as soon as possible: °allergic reactions like skin rash, itching or hives, swelling of the face, lips, or tongue °bloody or black, tarry °breathing problems °changes in vision °chest pain °chills °confusion °constipation °cough °diarrhea °dizziness or feeling faint or lightheaded °fast or irregular heartbeat °fever °flushing °joint pain °low blood counts - this medicine may decrease the number of white blood cells, red blood cells and platelets. You may be at increased risk for infections and bleeding. °muscle pain °muscle weakness °pain, tingling, numbness in the hands or feet °persistent headache °redness, blistering, peeling or loosening of the skin, including inside the mouth °signs and symptoms of high blood sugar such as dizziness; dry mouth; dry skin; fruity breath; nausea; stomach pain; increased hunger or thirst; increased urination °signs and symptoms of kidney injury like trouble passing urine or change in the amount of urine °signs and symptoms of liver injury like dark urine, light-colored stools, loss of appetite, nausea, right upper belly pain, yellowing of the eyes or skin °sweating °swollen lymph nodes °weight loss °Side effects that usually do not   require medical attention (report to your doctor or health care professional if they continue or are bothersome): °decreased appetite °hair loss °tiredness °This list may not describe all possible side effects. Call your doctor for medical advice about side effects. You may report side effects to FDA at 1-800-FDA-1088. °Where should I keep my medication? °This drug is given in a hospital or clinic and will not be stored at home. °NOTE: This sheet is a summary. It may not cover all possible  information. If you have questions about this medicine, talk to your doctor, pharmacist, or health care provider. °© 2022 Elsevier/Gold Standard (2020-11-29 00:00:00) ° °

## 2021-07-04 NOTE — Progress Notes (Signed)
Patient presents for treatment. RN assessment completed along with the following: ? ?Labs/vitals reviewed - Yes, and within treatment parameters.   ?Weight within 10% of previous measurement - Yes ?Informed consent completed and reflects current therapy/intent - Yes, on date 06/13/2021             ?Provider progress note reviewed - Yes, today's provider note was reviewed. ?Treatment/Antibody/Supportive plan reviewed - Yes, and there are no adjustments needed for today's treatment. ?S&H and other orders reviewed - Yes, and there are no additional orders identified. ?Previous treatment date reviewed - Yes, and the appropriate amount of time has elapsed between treatments. ?Clinic Hand Off Received from - Yes from [place], RN ? ?Patient to proceed with treatment.  ? ?

## 2021-07-05 ENCOUNTER — Encounter: Payer: Medicare Other | Admitting: Internal Medicine

## 2021-07-10 ENCOUNTER — Encounter: Payer: Self-pay | Admitting: Oncology

## 2021-07-19 DIAGNOSIS — G3184 Mild cognitive impairment, so stated: Secondary | ICD-10-CM | POA: Diagnosis not present

## 2021-07-19 DIAGNOSIS — Z8673 Personal history of transient ischemic attack (TIA), and cerebral infarction without residual deficits: Secondary | ICD-10-CM | POA: Diagnosis not present

## 2021-07-19 DIAGNOSIS — R1313 Dysphagia, pharyngeal phase: Secondary | ICD-10-CM | POA: Diagnosis not present

## 2021-07-19 DIAGNOSIS — R41841 Cognitive communication deficit: Secondary | ICD-10-CM | POA: Diagnosis not present

## 2021-07-20 DIAGNOSIS — M6389 Disorders of muscle in diseases classified elsewhere, multiple sites: Secondary | ICD-10-CM | POA: Diagnosis not present

## 2021-07-20 DIAGNOSIS — R152 Fecal urgency: Secondary | ICD-10-CM | POA: Diagnosis not present

## 2021-07-20 DIAGNOSIS — Z85038 Personal history of other malignant neoplasm of large intestine: Secondary | ICD-10-CM | POA: Diagnosis not present

## 2021-07-20 DIAGNOSIS — R278 Other lack of coordination: Secondary | ICD-10-CM | POA: Diagnosis not present

## 2021-07-23 ENCOUNTER — Other Ambulatory Visit: Payer: Self-pay | Admitting: Oncology

## 2021-07-25 ENCOUNTER — Inpatient Hospital Stay (HOSPITAL_BASED_OUTPATIENT_CLINIC_OR_DEPARTMENT_OTHER): Payer: Medicare Other | Admitting: Oncology

## 2021-07-25 ENCOUNTER — Inpatient Hospital Stay: Payer: Medicare Other

## 2021-07-25 ENCOUNTER — Telehealth: Payer: Self-pay

## 2021-07-25 ENCOUNTER — Encounter: Payer: Self-pay | Admitting: *Deleted

## 2021-07-25 ENCOUNTER — Inpatient Hospital Stay: Payer: Medicare Other | Attending: Oncology

## 2021-07-25 VITALS — BP 147/90 | HR 93 | Temp 98.1°F | Resp 18 | Ht 62.0 in | Wt 121.6 lb

## 2021-07-25 DIAGNOSIS — Z5112 Encounter for antineoplastic immunotherapy: Secondary | ICD-10-CM | POA: Diagnosis not present

## 2021-07-25 DIAGNOSIS — C185 Malignant neoplasm of splenic flexure: Secondary | ICD-10-CM | POA: Diagnosis not present

## 2021-07-25 DIAGNOSIS — Z7901 Long term (current) use of anticoagulants: Secondary | ICD-10-CM | POA: Diagnosis not present

## 2021-07-25 DIAGNOSIS — D649 Anemia, unspecified: Secondary | ICD-10-CM | POA: Diagnosis not present

## 2021-07-25 DIAGNOSIS — M069 Rheumatoid arthritis, unspecified: Secondary | ICD-10-CM | POA: Insufficient documentation

## 2021-07-25 DIAGNOSIS — E039 Hypothyroidism, unspecified: Secondary | ICD-10-CM | POA: Diagnosis not present

## 2021-07-25 DIAGNOSIS — I4891 Unspecified atrial fibrillation: Secondary | ICD-10-CM | POA: Diagnosis not present

## 2021-07-25 LAB — CMP (CANCER CENTER ONLY)
ALT: 18 U/L (ref 0–44)
AST: 20 U/L (ref 15–41)
Albumin: 4.3 g/dL (ref 3.5–5.0)
Alkaline Phosphatase: 86 U/L (ref 38–126)
Anion gap: 10 (ref 5–15)
BUN: 17 mg/dL (ref 8–23)
CO2: 21 mmol/L — ABNORMAL LOW (ref 22–32)
Calcium: 9.2 mg/dL (ref 8.9–10.3)
Chloride: 103 mmol/L (ref 98–111)
Creatinine: 0.74 mg/dL (ref 0.44–1.00)
GFR, Estimated: >60 mL/min (ref >60)
Glucose, Bld: 105 mg/dL — ABNORMAL HIGH (ref 70–99)
Potassium: 4.2 mmol/L (ref 3.5–5.1)
Sodium: 134 mmol/L — ABNORMAL LOW (ref 135–145)
Total Bilirubin: 0.6 mg/dL (ref 0.3–1.2)
Total Protein: 6.8 g/dL (ref 6.5–8.1)

## 2021-07-25 LAB — CBC WITH DIFFERENTIAL (CANCER CENTER ONLY)
Abs Immature Granulocytes: 0.09 10*3/uL — ABNORMAL HIGH (ref 0.00–0.07)
Basophils Absolute: 0.1 10*3/uL (ref 0.0–0.1)
Basophils Relative: 1 %
Eosinophils Absolute: 0.1 10*3/uL (ref 0.0–0.5)
Eosinophils Relative: 1 %
HCT: 33.2 % — ABNORMAL LOW (ref 36.0–46.0)
Hemoglobin: 10.8 g/dL — ABNORMAL LOW (ref 12.0–15.0)
Immature Granulocytes: 1 %
Lymphocytes Relative: 14 %
Lymphs Abs: 1.1 10*3/uL (ref 0.7–4.0)
MCH: 33.6 pg (ref 26.0–34.0)
MCHC: 32.5 g/dL (ref 30.0–36.0)
MCV: 103.4 fL — ABNORMAL HIGH (ref 80.0–100.0)
Monocytes Absolute: 0.9 10*3/uL (ref 0.1–1.0)
Monocytes Relative: 12 %
Neutro Abs: 5.3 10*3/uL (ref 1.7–7.7)
Neutrophils Relative %: 71 %
Platelet Count: 182 10*3/uL (ref 150–400)
RBC: 3.21 MIL/uL — ABNORMAL LOW (ref 3.87–5.11)
RDW: 14.1 % (ref 11.5–15.5)
WBC Count: 7.4 10*3/uL (ref 4.0–10.5)
nRBC: 0 % (ref 0.0–0.2)

## 2021-07-25 LAB — TSH: TSH: 1.825 u[IU]/mL (ref 0.350–4.500)

## 2021-07-25 MED ORDER — SODIUM CHLORIDE 0.9 % IV SOLN: Freq: Once | INTRAVENOUS | Status: AC

## 2021-07-25 MED ORDER — SODIUM CHLORIDE 0.9 % IV SOLN
200.0000 mg | Freq: Once | INTRAVENOUS | Status: AC
  Administered 2021-07-25: 200 mg via INTRAVENOUS
  Filled 2021-07-25: qty 8

## 2021-07-25 NOTE — Telephone Encounter (Signed)
-----   Message from Ladell Pier, MD sent at 07/25/2021  4:43 PM EDT ----- ?Please call patient, TSH is normal, follow-up as scheduled ? ?

## 2021-07-25 NOTE — Telephone Encounter (Signed)
Pt verbalized understanding.

## 2021-07-25 NOTE — Telephone Encounter (Signed)
Patient has been scheduled for colon with Dr. Fuller Plan on 09/20/21 at 10:30 am. Separate phone note sent to [facility] pre-op due to patient being on eliquis. Patient's daughter is aware that I will call back with medication instructions once confirmation is received.  ?

## 2021-07-25 NOTE — Progress Notes (Signed)
Patient presents for treatment. RN assessment completed along with the following: ? ?Labs/vitals reviewed - Yes, and within treatment parameters.   ?Weight within 10% of previous measurement - Yes ?Informed consent completed and reflects current therapy/intent - Yes, on date 06/13/21             ?Provider progress note reviewed - Today's provider note not yet available.  Provider note from 07/04/21 reviewed. ?Treatment/Antibody/Supportive plan reviewed - Yes, and there are no adjustments needed for today's treatment. ?S&H and other orders reviewed - Yes, and there are no additional orders identified. ?Previous treatment date reviewed - Yes, and the appropriate amount of time has elapsed between treatments. ?Clinic Hand Off Received from - Merceda Elks, RN ? ?Patient to proceed with treatment.   ?

## 2021-07-25 NOTE — Telephone Encounter (Signed)
Patient daughter returned call. Ask to call back after lunch ?

## 2021-07-25 NOTE — Progress Notes (Signed)
Patient seen by Dr. Sherrill today ? ?Vitals are within treatment parameters. ? ?Labs reviewed by Dr. Sherrill and are within treatment parameters. ? ?Per physician team, patient is ready for treatment and there are NO modifications to the treatment plan.  ?

## 2021-07-25 NOTE — Progress Notes (Signed)
?  [facility] ?OFFICE PROGRESS NOTE ? ? ?Diagnosis: Colon cancer, anemia ? ?INTERVAL HISTORY:  ? ?Ms. Los completed cycle 2 pembrolizumab 07/04/2021.  She is here today with her daughter.  She developed erythema at the palmar surface of the hands 1 to 2 weeks following pembrolizumab.  The hands have "appealed ".  She is using over-the-counter hydrocortisone cream.  The erythema has improved. ?Good appetite.  She is exercising 5 days/week.  She has chronic upper airway congestion worsened with pollen exposure.  No bleeding.  The stool is sometimes dark.  No diarrhea. ? ?Objective: ? ?Vital signs in last 24 hours: ? ?Blood pressure (!) 147/90, pulse 93, temperature 98.1 ?F (36.7 ?C), temperature source Oral, resp. rate 18, height _0  (1.575 m), weight 121 lb 9.6 oz (55.2 kg), SpO2 98 %. ?Resp: Scattered end inspiratory wheeze and rhonchi, no respiratory distress ?Cardio: Irregular ?GI: No hepatosplenomegaly ?Vascular: No leg edema  ?Skin: Mild erythema, skin thickening, and superficial desquamation at the palms.  No rash over the trunk or extremities ? ?Lab Results: ? ?Lab Results  ?Component Value Date  ? WBC 7.4 07/25/2021  ? HGB 10.8 (L) 07/25/2021  ? HCT 33.2 (L) 07/25/2021  ? MCV 103.4 (H) 07/25/2021  ? PLT 182 07/25/2021  ? NEUTROABS 5.3 07/25/2021  ? ? ?CMP  ?Lab Results  ?Component Value Date  ? NA 134 (L) 07/25/2021  ? K 4.2 07/25/2021  ? CL 103 07/25/2021  ? CO2 21 (L) 07/25/2021  ? GLUCOSE 105 (H) 07/25/2021  ? BUN 17 07/25/2021  ? CREATININE 0.74 07/25/2021  ? CALCIUM 9.2 07/25/2021  ? PROT 6.8 07/25/2021  ? ALBUMIN 4.3 07/25/2021  ? AST 20 07/25/2021  ? ALT 18 07/25/2021  ? ALKPHOS 86 07/25/2021  ? BILITOT 0.6 07/25/2021  ? GFRNONAA >60 07/25/2021  ? GFRAA 79.35 02/02/2021  ? ? ?Lab Results  ?Component Value Date  ? CEA 3.83 05/16/2021  ? ? ?Medications: I have reviewed the patient's current medications. ? ? ?Assessment/Plan: ?Colon cancer-splenic flexure mass on colonoscopy  02/22/2021 ?Biopsy 02/22/2021-invasive well differentiated adenocarcinoma, loss of MLH1 and PMS2 expression, MSI high, MLH1 hyper methylation present ?CTs 03/31/2021-no evidence of metastatic disease, possible mass at the splenic flexure, 4 mm left upper lung nodule, bilateral lung scarring ?Cycle 1 pembrolizumab 06/13/2021 ?Cycle 2 pembrolizumab 07/04/2021 ?Cycle 3 pembrolizumab 07/25/2021 ?Macrocytic anemia ?Sessile serrated polyp with focus of high-grade dysplasia on colonoscopy 02/22/2021 ?Colon cancer 2006, status post a right colectomy-"node positive "cancer per patient, received adjuvant chemotherapy ?Atrial fibrillation, maintained on anticoagulation ?History of a CVA and TIA ?Mild cognitive impairment ?Rheumatoid arthritis ?Hypothyroidism ?10.  Chronic lung disease? ? ? ? ? ? ?Disposition: ?Ms. Dancer appears well.  She has completed 2 treatments with pembrolizumab.  The hemoglobin is improved.  She will complete cycle 3 today.  She has erythema at the palms.  No other evidence of skin toxicity.  I suspect the palmar erythema is related to toxicity from the pembrolizumab.  This appears to be responding to over-the-counter hydrocortisone cream.  She will continue the hydrocortisone.  She will contact us for progressive skin symptoms.  She will call for dyspnea or increased cough. ? ?I will contact Dr. Fuller Plan to arrange for a restaging endoscopic evaluation after cycle 4 pembrolizumab. ? ?She will return for an office visit in the next cycle of pembrolizumab on 08/22/2021. ? ?Betsy Coder, MD ? ?07/25/2021  ?1:57 PM ? ? ?

## 2021-07-25 NOTE — Patient Instructions (Signed)
[facility]   °Discharge Instructions: °Thank you for choosing [facility] to provide your oncology and hematology care.  ° °If you have a lab appointment with the Cancer Center, please go directly to the Cancer Center and check in at the registration area. °  °Wear comfortable clothing and clothing appropriate for easy access to any Portacath or PICC line.  ° °We strive to give you quality time with your provider. You may need to reschedule your appointment if you arrive late (15 or more minutes).  Arriving late affects you and other patients whose appointments are after yours.  Also, if you miss three or more appointments without notifying the office, you may be dismissed from the clinic at the provider’s discretion.    °  °For prescription refill requests, have your pharmacy contact our office and allow 72 hours for refills to be completed.   ° °Today you received the following chemotherapy and/or immunotherapy agents Pembrolizumab (KEYTRUDA).    °  °To help prevent nausea and vomiting after your treatment, we encourage you to take your nausea medication as directed. ° °BELOW ARE SYMPTOMS THAT SHOULD BE REPORTED IMMEDIATELY: °*FEVER GREATER THAN 100.4 F (38 °C) OR HIGHER °*CHILLS OR SWEATING °*NAUSEA AND VOMITING THAT IS NOT CONTROLLED WITH YOUR NAUSEA MEDICATION °*UNUSUAL SHORTNESS OF BREATH °*UNUSUAL BRUISING OR BLEEDING °*URINARY PROBLEMS (pain or burning when urinating, or frequent urination) °*BOWEL PROBLEMS (unusual diarrhea, constipation, pain near the anus) °TENDERNESS IN MOUTH AND THROAT WITH OR WITHOUT PRESENCE OF ULCERS (sore throat, sores in mouth, or a toothache) °UNUSUAL RASH, SWELLING OR PAIN  °UNUSUAL VAGINAL DISCHARGE OR ITCHING  ° °Items with * indicate a potential emergency and should be followed up as soon as possible or go to the Emergency Department if any problems should occur. ° °Please show the CHEMOTHERAPY ALERT CARD or IMMUNOTHERAPY ALERT CARD  at check-in to the Emergency Department and triage nurse. ° °Should you have questions after your visit or need to cancel or reschedule your appointment, please contact [facility]  Dept: [phone]  and follow the prompts.  Office hours are 8:00 a.m. to 4:30 p.m. Monday - Friday. Please note that voicemails left after 4:00 p.m. may not be returned until the following business day.  We are closed weekends and major holidays. You have access to a nurse at all times for urgent questions. Please call the main number to the clinic Dept: [phone] and follow the prompts. ° ° °For any non-urgent questions, you may also contact your provider using MyChart. We now offer e-Visits for anyone 18 and older to request care online for non-urgent symptoms. For details visit [email or web address]. °  °Also download the MyChart app! Go to the app store, search "MyChart", open the app, select [facility], and log in with your MyChart username and password. ° °Due to Covid, a mask is required upon entering the hospital/clinic. If you do not have a mask, one will be given to you upon arrival. For doctor visits, patients may have 1 support person aged 18 or older with them. For treatment visits, patients cannot have anyone with them due to current Covid guidelines and our immunocompromised population.  ° °Pembrolizumab injection °What is this medication? °PEMBROLIZUMAB (pem broe liz ue mab) is a monoclonal antibody. It is used to treat certain types of cancer. °This medicine may be used for other purposes; ask your health care provider or pharmacist if you have questions. °COMMON BRAND NAME(S):   Keytruda °What should I tell my care team before I take this medication? °They need to know if you have any of these conditions: °autoimmune diseases like Crohn's disease, ulcerative colitis, or lupus °have had or planning to have an allogeneic stem cell transplant (uses someone else's stem cells) °history of  organ transplant °history of chest radiation °nervous system problems like myasthenia gravis or Guillain-Barre syndrome °an unusual or allergic reaction to pembrolizumab, other medicines, foods, dyes, or preservatives °pregnant or trying to get pregnant °breast-feeding °How should I use this medication? °This medicine is for infusion into a vein. It is given by a health care professional in a hospital or clinic setting. °A special MedGuide will be given to you before each treatment. Be sure to read this information carefully each time. °Talk to your pediatrician regarding the use of this medicine in children. While this drug may be prescribed for children as young as 6 months for selected conditions, precautions do apply. °Overdosage: If you think you have taken too much of this medicine contact a poison control center or emergency room at once. °NOTE: This medicine is only for you. Do not share this medicine with others. °What if I miss a dose? °It is important not to miss your dose. Call your doctor or health care professional if you are unable to keep an appointment. °What may interact with this medication? °Interactions have not been studied. °This list may not describe all possible interactions. Give your health care provider a list of all the medicines, herbs, non-prescription drugs, or dietary supplements you use. Also tell them if you smoke, drink alcohol, or use illegal drugs. Some items may interact with your medicine. °What should I watch for while using this medication? °Your condition will be monitored carefully while you are receiving this medicine. °You may need blood work done while you are taking this medicine. °Do not become pregnant while taking this medicine or for 4 months after stopping it. Women should inform their doctor if they wish to become pregnant or think they might be pregnant. There is a potential for serious side effects to an unborn child. Talk to your health care professional or  pharmacist for more information. Do not breast-feed an infant while taking this medicine or for 4 months after the last dose. °What side effects may I notice from receiving this medication? °Side effects that you should report to your doctor or health care professional as soon as possible: °allergic reactions like skin rash, itching or hives, swelling of the face, lips, or tongue °bloody or black, tarry °breathing problems °changes in vision °chest pain °chills °confusion °constipation °cough °diarrhea °dizziness or feeling faint or lightheaded °fast or irregular heartbeat °fever °flushing °joint pain °low blood counts - this medicine may decrease the number of white blood cells, red blood cells and platelets. You may be at increased risk for infections and bleeding. °muscle pain °muscle weakness °pain, tingling, numbness in the hands or feet °persistent headache °redness, blistering, peeling or loosening of the skin, including inside the mouth °signs and symptoms of high blood sugar such as dizziness; dry mouth; dry skin; fruity breath; nausea; stomach pain; increased hunger or thirst; increased urination °signs and symptoms of kidney injury like trouble passing urine or change in the amount of urine °signs and symptoms of liver injury like dark urine, light-colored stools, loss of appetite, nausea, right upper belly pain, yellowing of the eyes or skin °sweating °swollen lymph nodes °weight loss °Side effects that usually do not   require medical attention (report to your doctor or health care professional if they continue or are bothersome): °decreased appetite °hair loss °tiredness °This list may not describe all possible side effects. Call your doctor for medical advice about side effects. You may report side effects to FDA at 1-800-FDA-1088. °Where should I keep my medication? °This drug is given in a hospital or clinic and will not be stored at home. °NOTE: This sheet is a summary. It may not cover all possible  information. If you have questions about this medicine, talk to your doctor, pharmacist, or health care provider. °© 2022 Elsevier/Gold Standard (2020-11-29 00:00:00) ° °

## 2021-07-26 ENCOUNTER — Encounter: Payer: Medicare Other | Admitting: Internal Medicine

## 2021-07-26 ENCOUNTER — Telehealth: Payer: Self-pay

## 2021-07-26 DIAGNOSIS — I700 Atherosclerosis of aorta: Secondary | ICD-10-CM | POA: Insufficient documentation

## 2021-07-26 NOTE — Telephone Encounter (Signed)
Telephone pre visit scheduled for 6/13 at 1:00 pm. Patient's daughter is aware. ?

## 2021-07-26 NOTE — Telephone Encounter (Signed)
? ? ?  Name: Kathy Velez  ?DOB: 1932/02/05  ?MRN: [id number] ? ?Primary Cardiologist: Pixie Casino, MD ? ? ?Preoperative team, please contact this patient and set up a phone call appointment for further preoperative risk assessment. Please obtain consent and complete medication review. Thank you for your help. ? ?I confirm that guidance regarding antiplatelet and oral anticoagulation therapy has been completed and, if necessary, noted below. ? ? ?Mable Fill, Marissa Nestle, NP ?07/26/2021, 4:36 PM ?[facility] ?[address] ?[place], [place] [address] ?  ?

## 2021-07-26 NOTE — Telephone Encounter (Signed)
[facility] Pre-operative Risk Assessment  ?   ?Request for surgical clearance:     Endoscopy Procedure ? ?What type of surgery is being performed?     Colonoscopy ? ?When is this surgery scheduled?     09/20/21 ? ?What type of clearance is required ?   Pharmacy ? ?Are there any medications that need to be held prior to surgery and how long? Eliquis ? ?Practice name and name of physician performing surgery?      [facility] ? ?What is your office phone and fax number?      Phone- [phone]  Fax- [phone] ? ?Anesthesia type (None, local, MAC, general) ?       MAC  ?

## 2021-07-26 NOTE — Telephone Encounter (Signed)
Will route to pharm team for input regarding [place]. Last OV was 04/07/2021. We will plan for virtual visit following [place] comments. ?

## 2021-07-26 NOTE — Telephone Encounter (Signed)
Patient with diagnosis of afib on Eliquis for anticoagulation.   ? ?Procedure: colonoscopy ?Date of procedure: 09/20/21 ? ?CHA2DS2-VASc Score = 7  ?This indicates a 11.2% annual risk of stroke. ?The patient's score is based upon: ?CHF History: 0 ?HTN History: 1 ?Diabetes History: 0 ?Stroke History: 2 ?Vascular Disease History: 1 ?Age Score: 2 ?Gender Score: 1 ? ?Aortic atherosclerosis noted on  chest CT 03/2021, PMH updated. ? ?CrCl 55m/min ?Platelet count 182K ? ?Per office protocol, patient can hold Eliquis for 1-2 days prior to procedure. She should resume anticoag as soon as safely possible after given her elevated CV risk. ?

## 2021-07-27 DIAGNOSIS — R1313 Dysphagia, pharyngeal phase: Secondary | ICD-10-CM | POA: Diagnosis not present

## 2021-07-27 DIAGNOSIS — G3184 Mild cognitive impairment, so stated: Secondary | ICD-10-CM | POA: Diagnosis not present

## 2021-07-27 DIAGNOSIS — Z8673 Personal history of transient ischemic attack (TIA), and cerebral infarction without residual deficits: Secondary | ICD-10-CM | POA: Diagnosis not present

## 2021-07-27 DIAGNOSIS — R41841 Cognitive communication deficit: Secondary | ICD-10-CM | POA: Diagnosis not present

## 2021-07-27 NOTE — Telephone Encounter (Signed)
Left message for the patient to contact the office to schedule a tele health appointment.  ? ?Left message on patient and and patients daughters voicemail advising a call back. ?

## 2021-07-28 ENCOUNTER — Telehealth: Payer: Self-pay

## 2021-07-28 NOTE — Telephone Encounter (Addendum)
Spoke with the patient and scheduled the teleheatlh appointment. Patient states her daughter handles her medication; and she has a list some where but she was not sure where it was. I advised the patient to have her daughter contact the office with an updated medication list or send the list via mychart. Patient voiced understanding but said she [place] unsure if her daughter uses the mychart.  ?

## 2021-07-28 NOTE — Telephone Encounter (Signed)
?  Patient Consent for Virtual Visit  ? ? ?    ? ?Kathy Velez has provided verbal consent on 07/28/2021 for a virtual visit (video or telephone). ? ? ?CONSENT FOR VIRTUAL VISIT FOR:  Kathy Velez  ?By participating in this virtual visit I agree to the following: ? ?I hereby voluntarily request, consent and authorize [facility] and its employed or contracted physicians, physician assistants, nurse practitioners or other licensed health care professionals (the Practitioner), to provide me with telemedicine health care services (the ?Services") as deemed necessary by the treating Practitioner. I acknowledge and consent to receive the Services by the Practitioner via telemedicine. I understand that the telemedicine visit will involve communicating with the Practitioner through live audiovisual communication technology and the disclosure of certain medical information by electronic transmission. I acknowledge that I have been given the opportunity to request an in-person assessment or other available alternative prior to the telemedicine visit and am voluntarily participating in the telemedicine visit. ? ?I understand that I have the right to withhold or withdraw my consent to the use of telemedicine in the course of my care at any time, without affecting my right to future care or treatment, and that the Practitioner or I may terminate the telemedicine visit at any time. I understand that I have the right to inspect all information obtained and/or recorded in the course of the telemedicine visit and may receive copies of available information for a reasonable fee.  I understand that some of the potential risks of receiving the Services via telemedicine include:  ?Delay or interruption in medical evaluation due to technological equipment failure or disruption; ?Information transmitted may not be sufficient (e.g. poor resolution of images) to allow for appropriate medical decision making by the Practitioner; and/or  ?In  rare instances, security protocols could fail, causing a breach of personal health information. ? ?Furthermore, I acknowledge that it is my responsibility to provide information about my medical history, conditions and care that is complete and accurate to the best of my ability. I acknowledge that Practitioner's advice, recommendations, and/or decision may be based on factors not within their control, such as incomplete or inaccurate data provided by me or distortions of diagnostic images or specimens that may result from electronic transmissions. I understand that the practice of medicine is not an exact science and that Practitioner makes no warranties or guarantees regarding treatment outcomes. I acknowledge that a copy of this consent can be made available to me via my patient portal ([facility]), or I can request a printed copy by calling the office of [facility].   ? ?I understand that my insurance will be billed for this visit.  ? ?I have read or had this consent read to me. ?I understand the contents of this consent, which adequately explains the benefits and risks of the Services being provided via telemedicine.  ?I have been provided ample opportunity to ask questions regarding this consent and the Services and have had my questions answered to my satisfaction. ?I give my informed consent for the services to be provided through the use of telemedicine in my medical care ? ? ?

## 2021-07-29 DIAGNOSIS — Z20822 Contact with and (suspected) exposure to covid-19: Secondary | ICD-10-CM | POA: Diagnosis not present

## 2021-07-30 DIAGNOSIS — Z20822 Contact with and (suspected) exposure to covid-19: Secondary | ICD-10-CM | POA: Diagnosis not present

## 2021-08-02 ENCOUNTER — Encounter: Payer: Self-pay | Admitting: Internal Medicine

## 2021-08-02 ENCOUNTER — Non-Acute Institutional Stay (INDEPENDENT_AMBULATORY_CARE_PROVIDER_SITE_OTHER): Payer: Self-pay | Admitting: Internal Medicine

## 2021-08-02 DIAGNOSIS — Z91199 Patient's noncompliance with other medical treatment and regimen due to unspecified reason: Secondary | ICD-10-CM

## 2021-08-04 ENCOUNTER — Encounter: Payer: Self-pay | Admitting: Internal Medicine

## 2021-08-04 DIAGNOSIS — Z8673 Personal history of transient ischemic attack (TIA), and cerebral infarction without residual deficits: Secondary | ICD-10-CM | POA: Diagnosis not present

## 2021-08-04 DIAGNOSIS — R41841 Cognitive communication deficit: Secondary | ICD-10-CM | POA: Diagnosis not present

## 2021-08-04 DIAGNOSIS — R1313 Dysphagia, pharyngeal phase: Secondary | ICD-10-CM | POA: Diagnosis not present

## 2021-08-04 DIAGNOSIS — G3184 Mild cognitive impairment, so stated: Secondary | ICD-10-CM | POA: Diagnosis not present

## 2021-08-07 NOTE — Progress Notes (Signed)
No show

## 2021-08-08 ENCOUNTER — Ambulatory Visit (INDEPENDENT_AMBULATORY_CARE_PROVIDER_SITE_OTHER): Payer: Medicare Other | Admitting: Nurse Practitioner

## 2021-08-08 DIAGNOSIS — Z01810 Encounter for preprocedural cardiovascular examination: Secondary | ICD-10-CM

## 2021-08-08 NOTE — Progress Notes (Signed)
? ?Virtual Visit via Telephone Note  ? ?This visit type was conducted due to national recommendations for restrictions regarding the COVID-19 Pandemic (e.g. social distancing) in an effort to limit this patient's exposure and mitigate transmission in our community.  Due to her co-morbid illnesses, this patient is at least at moderate risk for complications without adequate follow up.  This format is felt to be most appropriate for this patient at this time.  The patient did not have access to video technology/had technical difficulties with video requiring transitioning to audio format only (telephone).  All issues noted in this document were discussed and addressed.  No physical exam could be performed with this format.  Please refer to the patient's chart for her  consent to telehealth for [facility]. ? ?Evaluation Performed:  Preoperative cardiovascular risk assessment ?_____________  ? ?Date:  08/08/2021  ? ?Patient ID:  Kathy Velez, DOB 1931-12-03, MRN [id number] ?Patient Location:  ?Home ?Provider location:   ?Office ? ?Primary Care Provider:  Virgie Dad, MD ?Primary Cardiologist:  Pixie Casino, MD ? ?Chief Complaint  ?  ?88 y.o. y/o female with a h/o longstanding persistent atrial fibrillation, stroke/TIA, and hypertension, who is pending colonoscopy scheduled for 09/20/2021 with [facility], and presents today for telephonic preoperative cardiovascular risk assessment. ? ?Past Medical History  ?  ?Past Medical History:  ?Diagnosis Date  ? Anemia   ? Anxiety   ? Arthritis   ? Back  ? Atrial fibrillation ([place])   ? Cancer [facility]) 2006  ? Colon.  Basal Cell Skin cancer- right arm  ? Colon cancer ([place])   ? Constipation due to pain medication therapy   ? after heart surgery  ? Dysrhythmia   ? PAF  ? GERD (gastroesophageal reflux disease)   ? Heart murmur   ? Hypertension   ? Hypothyroidism   ? RA (rheumatoid arthritis) ([place])   ? Restless leg   ? Seizures ([place])   ? after Heart Surgey  ? Stroke  [facility])   ? TIA- found by neurologist after   ? ?Past Surgical History:  ?Procedure Laterality Date  ? ABDOMINAL HYSTERECTOMY  1970  ? Partial   ? COLON RESECTION  2006  ? cancer  ? COLON SURGERY    ? COLONOSCOPY    ? EYE SURGERY Bilateral   ? Cataract  ? MAXIMUM ACCESS (MAS)POSTERIOR LUMBAR INTERBODY FUSION (PLIF) 2 LEVEL N/A 04/12/2015  ? Procedure: Lumbar Three-Five Decompression, Pedicle Screw Fixation, and Posteriolateral Arthrodesis;  Surgeon: Erline Levine, MD;  Location: [facility];  Service: Neurosurgery;  Laterality: N/A;  L3-4 L4-5 Maximum access posterior lumbar fusion, possible interbodies and resection of synovial cyst at L4-5  ? MITRAL VALVE REPAIR  01/20/2013  ? Gore-tex cords to P1, P2, and P3. Magic suture to posterior medial commisure, #30 Physio 1 ring. Done in [place]  ? TONSILLECTOMY    ? about 1940  ? TRICUSPID VALVE SURGERY  01/20/2013  ? #28 TriAd ring done in [place]  ? ? ?Allergies ? ?Allergies  ?Allergen Reactions  ? Codeine Other (See Comments)  ?  "just don't take it well"  ? Gabapentin   ?  dizziness  ? Molds & Smuts   ? Pollen Extract Swelling  ? Bee Venom Swelling and Rash  ? ? ?History of Present Illness  ?  ?Kathy Velez is a 88 y.o. female who presents via audio/video conferencing for a telehealth visit today.  Pt was last seen in cardiology clinic on 04/07/2021 by  Dr. Marlou Porch.  At that time Kathy Velez was doing well from a cardiac standpoint.  He was cleared for colectomy procedure at the time. The patient is now pending procedure as outlined above. Since her last visit, she has been stable from a cardiac standpoint. She denies chest pain, palpitations, dyspnea, pnd, orthopnea, n, v, dizziness, syncope, edema, weight gain, or early satiety.  She reports feeling well overall and denies any new symptoms or concerns today. ? ?Home Medications  ?  ?Prior to Admission medications   ?Medication Sig Start Date End Date Taking? Authorizing Provider  ?acetaminophen (TYLENOL) 325 MG tablet  Take 2 tablets (650 mg total) by mouth every 6 (six) hours as needed for mild pain or fever. 09/08/20   Little Ishikawa, MD  ?amoxicillin (AMOXIL) 500 MG capsule Take 500 mg by mouth. Before dental visits every 6 months ?Patient not taking: Reported on 05/16/2021    [provider]  ?apixaban (ELIQUIS) 2.5 MG TABS tablet Take 1 tablet (2.5 mg total) by mouth 2 (two) times daily. 01/02/21   Virgie Dad, MD  ?atorvastatin (LIPITOR) 20 MG tablet TAKE 1 TABLET DAILY 06/14/21   Virgie Dad, MD  ?Cholecalciferol (VITAMIN D) 50 MCG (2000 UT) CAPS Take 1 capsule by mouth daily.    [provider]  ?digoxin (LANOXIN) 0.125 MG tablet TAKE 1 TABLET DAILY 06/14/21   Virgie Dad, MD  ?ENBREL SURECLICK 50 MG/ML injection  03/06/18   [provider]  ?Iron, Ferrous Sulfate, 325 (65 Fe) MG TABS Take 325 mg by mouth daily. 01/03/21   Royal Hawthorn, NP  ?leflunomide (ARAVA) 20 MG tablet Take 20 mg by mouth daily. 10/05/19   [provider]  ?levothyroxine (SYNTHROID) 75 MCG tablet Take one tablet by mouth once daily 30 minutes before breakfast on empty stomach. ?Patient taking differently: Take one tablet by mouth once daily 30 minutes before breakfast on empty stomach. * ON Sunday half a tablet 10/07/20   Virgie Dad, MD  ?metoprolol succinate (TOPROL-XL) 50 MG 24 hr tablet TAKE 1 TABLET DAILY 06/14/21   Virgie Dad, MD  ?mirtazapine (REMERON) 15 MG tablet TAKE 1 TABLET AT BEDTIME 02/20/21   Virgie Dad, MD  ?MULTIPLE VITAMIN PO Take by mouth.    [provider]  ?Multiple Vitamins-Minerals (PRESERVISION AREDS 2) CAPS Take 2 Doses by mouth daily at 6 (six) AM. For eye health    [provider]  ?omeprazole (PRILOSEC) 40 MG capsule TAKE 1 CAPSULE DAILY 10/10/20   Virgie Dad, MD  ?prochlorperazine (COMPAZINE) 10 MG tablet Take 1 tablet (10 mg total) by mouth every 6 (six) hours as needed for nausea or vomiting. ?Patient not taking: Reported on 07/25/2021  05/25/21   Ladell Pier, MD  ?valsartan (DIOVAN) 80 MG tablet Take 1 tablet (80 mg total) by mouth daily. 01/02/21   Virgie Dad, MD  ?Vibegron (GEMTESA PO) Take 75 mg by mouth daily. Dose unknown    [provider]  ?vitamin B-12 (CYANOCOBALAMIN) 1000 MCG tablet Take 1,000 mcg by mouth daily.     [provider]  ? ? ?Physical Exam  ?  ?Vital Signs:  Kathy Velez does not have vital signs available for review today. ? ?Given telephonic nature of communication, physical exam is limited. ?AAOx3. NAD. Normal affect.  Speech and respirations are unlabored. ? ?Accessory Clinical Findings  ?  ?None ? ?Assessment & Plan  ?  ?1.  Preoperative Cardiovascular Risk Assessment: ? ?  According to the Revised Cardiac Risk Index (RCRI), her Perioperative Risk of Major Cardiac Event is (%): 0.9. Her Functional Capacity in METs is: 4.36 according to the Duke Activity Status Index (DASI). Therefore, based on ACC/AHA guidelines, patient would be at acceptable risk for the planned procedure without further cardiovascular testing. ? ?Patient with diagnosis of afib on Eliquis for anticoagulation.   ?  ?Procedure: colonoscopy ?Date of procedure: 09/20/21 ?  ?CHA2DS2-VASc Score = 7  ?This indicates a 11.2% annual risk of stroke. ?The patient's score is based upon: ?CHF History: 0 ?HTN History: 1 ?Diabetes History: 0 ?Stroke History: 2 ?Vascular Disease History: 1 ?Age Score: 2 ?Gender Score: 1 ?  ?Aortic atherosclerosis noted on  chest CT 03/2021, PMH updated. ?  ?CrCl 17m/min ?Platelet count 182K ?  ?Per office protocol, patient can hold Eliquis for 1-2 days prior to procedure. She should resume anticoag as soon as safely possible after given her elevated CV risk. ? ?A copy of this note will be routed to requesting surgeon. ? ?Time:   ?Today, I have spent 6 minutes with the patient with telehealth technology discussing medical history, symptoms, and management plan.   ? ? ?ELenna Sciara NP ? ?08/08/2021, 2:09 PM ? ?

## 2021-08-09 ENCOUNTER — Encounter: Payer: Self-pay | Admitting: Internal Medicine

## 2021-08-09 ENCOUNTER — Non-Acute Institutional Stay: Payer: Medicare Other | Admitting: Internal Medicine

## 2021-08-09 ENCOUNTER — Telehealth: Payer: Self-pay

## 2021-08-09 VITALS — BP 146/88 | HR 83 | Temp 98.1°F | Ht 62.0 in | Wt 119.8 lb

## 2021-08-09 DIAGNOSIS — R152 Fecal urgency: Secondary | ICD-10-CM

## 2021-08-09 DIAGNOSIS — E039 Hypothyroidism, unspecified: Secondary | ICD-10-CM | POA: Diagnosis not present

## 2021-08-09 DIAGNOSIS — G3184 Mild cognitive impairment, so stated: Secondary | ICD-10-CM | POA: Diagnosis not present

## 2021-08-09 DIAGNOSIS — E782 Mixed hyperlipidemia: Secondary | ICD-10-CM

## 2021-08-09 DIAGNOSIS — I482 Chronic atrial fibrillation, unspecified: Secondary | ICD-10-CM | POA: Diagnosis not present

## 2021-08-09 DIAGNOSIS — I10 Essential (primary) hypertension: Secondary | ICD-10-CM

## 2021-08-09 DIAGNOSIS — M06049 Rheumatoid arthritis without rheumatoid factor, unspecified hand: Secondary | ICD-10-CM

## 2021-08-09 DIAGNOSIS — C189 Malignant neoplasm of colon, unspecified: Secondary | ICD-10-CM | POA: Diagnosis not present

## 2021-08-09 DIAGNOSIS — D509 Iron deficiency anemia, unspecified: Secondary | ICD-10-CM

## 2021-08-09 DIAGNOSIS — R159 Full incontinence of feces: Secondary | ICD-10-CM

## 2021-08-09 NOTE — Progress Notes (Signed)
? ?Location:  [place] ?  ?Place of Service:  Clinic (12) ? ?Provider:  ? ?Code Status:  ?Goals of Care:  ? ?  08/04/2021  ? 11:33 AM  ?Advanced Directives  ?Does Patient Have a Medical Advance Directive? Yes  ?Type of Advance Directive Healthcare Power of Attorney  ?Does patient want to make changes to medical advance directive? No - Patient declined  ?Copy of [place] in Chart? Yes - validated most recent copy scanned in chart (See row information)  ? ? ? ?Chief Complaint  ?Patient presents with  ? Medical Management of Chronic Issues  ?  Patient returns to the clinic for her 2 month follow up. Patient unable to confirm her meds, she says her daughter manages her medications. [facility].  ? Quality Metric Gaps  ?  Verified [facility] and [facility] patient is due for #5 covid-19 vaccine  ? ? ?HPI: Patient is a 88 y.o. female seen today for medical management of chronic diseases.   ? ?Patient has h/o  ?Colon Cancer with Anemia ?On Immunotherapy Opted out  from surgery ?Tolerating it well ?Has Fatigue Sleeps more ?Some discomfort in her Palms ?HGB has been stable takes Iron. Plan for repeat Colonoscopy ?Hypothyroidism ?Doing well with present dose ?H/o ILD ?Rheumatoid arthritis ?Taken off embrel for immunotherapy Noe only on [place] ?Seems to be doing well ?Fecal Incontinence ? ?MCI ?Lastt MMSE 20/30 MRI showed Cerebral Amyloid Angiopsthy ?[place] in [place] with helpo fof her Daughter ?Staying aindepednt in her ADLS ?[place] with her walker ?No Recent falls ?No othe rissues ? ? ?Past Medical History:  ?Diagnosis Date  ? Anemia   ? Anxiety   ? Arthritis   ? Back  ? Atrial fibrillation ([place])   ? Cancer [facility]) 2006  ? Colon.  Basal Cell Skin cancer- right arm  ? Colon cancer ([place])   ? Constipation due to pain medication therapy   ? after heart surgery  ? Dysrhythmia   ? PAF  ? GERD (gastroesophageal reflux disease)   ? Heart murmur   ? Hypertension   ? Hypothyroidism   ? RA  (rheumatoid arthritis) ([place])   ? Restless leg   ? Seizures ([place])   ? after Heart Surgey  ? Stroke [facility])   ? TIA- found by neurologist after   ? ? ?Past Surgical History:  ?Procedure Laterality Date  ? ABDOMINAL HYSTERECTOMY  1970  ? Partial   ? COLON RESECTION  2006  ? cancer  ? COLON SURGERY    ? COLONOSCOPY    ? EYE SURGERY Bilateral   ? Cataract  ? MAXIMUM ACCESS (MAS)POSTERIOR LUMBAR INTERBODY FUSION (PLIF) 2 LEVEL N/A 04/12/2015  ? Procedure: Lumbar Three-Five Decompression, Pedicle Screw Fixation, and Posteriolateral Arthrodesis;  Surgeon: Erline Levine, MD;  Location: [facility];  Service: Neurosurgery;  Laterality: N/A;  L3-4 L4-5 Maximum access posterior lumbar fusion, possible interbodies and resection of synovial cyst at L4-5  ? MITRAL VALVE REPAIR  01/20/2013  ? Gore-tex cords to P1, P2, and P3. Magic suture to posterior medial commisure, #30 Physio 1 ring. Done in [place]  ? TONSILLECTOMY    ? about 1940  ? TRICUSPID VALVE SURGERY  01/20/2013  ? #28 TriAd ring done in [place]  ? ? ?Allergies  ?Allergen Reactions  ? Codeine Other (See Comments)  ?  "just don't take it well"  ? Gabapentin   ?  dizziness  ? Molds & Smuts   ? Pollen Extract  Swelling  ? Bee Venom Swelling and Rash  ? ? ?Outpatient Encounter Medications as of 08/09/2021  ?Medication Sig  ? acetaminophen (TYLENOL) 325 MG tablet Take 2 tablets (650 mg total) by mouth every 6 (six) hours as needed for mild pain or fever.  ? amoxicillin (AMOXIL) 500 MG capsule Take 500 mg by mouth. Before dental visits every 6 months (Patient not taking: Reported on 05/16/2021)  ? apixaban (ELIQUIS) 2.5 MG TABS tablet Take 1 tablet (2.5 mg total) by mouth 2 (two) times daily.  ? atorvastatin (LIPITOR) 20 MG tablet TAKE 1 TABLET DAILY  ? Cholecalciferol (VITAMIN D) 50 MCG (2000 UT) CAPS Take 1 capsule by mouth daily.  ? digoxin (LANOXIN) 0.125 MG tablet TAKE 1 TABLET DAILY  ? ENBREL SURECLICK 50 MG/ML injection  (Patient not taking: Reported on 06/13/2021)  ? Iron,  Ferrous Sulfate, 325 (65 Fe) MG TABS Take 325 mg by mouth daily.  ? leflunomide (ARAVA) 20 MG tablet Take 20 mg by mouth daily.  ? levothyroxine (SYNTHROID) 75 MCG tablet Take one tablet by mouth once daily 30 minutes before breakfast on empty stomach. (Patient taking differently: Take one tablet by mouth once daily 30 minutes before breakfast on empty stomach. * ON Sunday half a tablet)  ? metoprolol succinate (TOPROL-XL) 50 MG 24 hr tablet TAKE 1 TABLET DAILY  ? mirtazapine (REMERON) 15 MG tablet TAKE 1 TABLET AT BEDTIME  ? MULTIPLE VITAMIN PO Take by mouth.  ? Multiple Vitamins-Minerals (PRESERVISION AREDS 2) CAPS Take 2 Doses by mouth daily at 6 (six) AM. For eye health  ? omeprazole (PRILOSEC) 40 MG capsule TAKE 1 CAPSULE DAILY  ? prochlorperazine (COMPAZINE) 10 MG tablet Take 1 tablet (10 mg total) by mouth every 6 (six) hours as needed for nausea or vomiting. (Patient not taking: Reported on 07/25/2021)  ? valsartan (DIOVAN) 80 MG tablet Take 1 tablet (80 mg total) by mouth daily.  ? Vibegron (GEMTESA PO) Take 75 mg by mouth daily. Dose unknown  ? vitamin B-12 (CYANOCOBALAMIN) 1000 MCG tablet Take 1,000 mcg by mouth daily.   ? ?No facility-administered encounter medications on file as of 08/09/2021.  ? ? ?Review of Systems:  ?Review of Systems  ?Constitutional:  Negative for activity change and appetite change.  ?HENT: Negative.    ?Respiratory:  Negative for cough and shortness of breath.   ?Cardiovascular:  Negative for leg swelling.  ?Gastrointestinal:  Negative for constipation.  ?Genitourinary: Negative.   ?Musculoskeletal:  Positive for gait problem. Negative for arthralgias and myalgias.  ?Skin: Negative.   ?Neurological:  Positive for weakness. Negative for dizziness.  ?Psychiatric/Behavioral:  Positive for confusion. Negative for dysphoric mood and sleep disturbance.   ? ?Health Maintenance  ?Topic Date Due  ? COVID-19 Vaccine (5 - Booster for Moderna series) 03/03/2021  ? INFLUENZA VACCINE  10/24/2021   ? TETANUS/TDAP  04/16/2027  ? Pneumonia Vaccine 26+ Years old  Completed  ? DEXA SCAN  Completed  ? Zoster Vaccines- Shingrix  Completed  ? HPV VACCINES  Aged Out  ? ? ?Physical Exam: ?Vitals:  ? 08/09/21 0919  ?BP: (!) 146/88  ?Pulse: 83  ?Temp: 98.1 ?F (36.7 ?C)  ?SpO2: 94%  ?Weight: 119 lb 12.8 oz (54.3 kg)  ?Height: '5\' 2"'$  (1.575 m)  ? ?Body mass index is 21.91 kg/m?Marland Kitchen ?Physical Exam ?Vitals reviewed.  ?Constitutional:   ?   Appearance: Normal appearance.  ?HENT:  ?   Head: Normocephalic.  ?   Nose: Nose normal.  ?  Mouth/Throat:  ?   Mouth: Mucous membranes are moist.  ?   Pharynx: Oropharynx is clear.  ?Eyes:  ?   Pupils: Pupils are equal, round, and reactive to light.  ?Cardiovascular:  ?   Rate and Rhythm: Normal rate and regular rhythm.  ?   Pulses: Normal pulses.  ?   Heart sounds: Normal heart sounds. No murmur heard. ?Pulmonary:  ?   Effort: Pulmonary effort is normal.  ?   Breath sounds: Normal breath sounds.  ?Abdominal:  ?   General: Abdomen is flat. Bowel sounds are normal.  ?   Palpations: Abdomen is soft.  ?Musculoskeletal:     ?   General: No swelling.  ?   Cervical back: Neck supple.  ?   Comments: Palm redness present  ?Skin: ?   General: Skin is warm.  ?Neurological:  ?   General: No focal deficit present.  ?   Mental Status: She is alert.  ?Psychiatric:     ?   Mood and Affect: Mood normal.     ?   Thought Content: Thought content normal.  ? ? ?Labs reviewed: ?Basic Metabolic Panel: ?Recent Labs  ?  05/16/21 ?1147 05/24/21 ?1035 06/13/21 ?1209 07/04/21 ?6568 07/25/21 ?0945  ?NA 136  --   --  134* 134*  ?K 4.6  --   --  4.0 4.2  ?CL 104  --   --  102 103  ?CO2 24  --   --  24 21*  ?GLUCOSE 112*  --   --  89 105*  ?BUN 16  --   --  22 17  ?CREATININE 0.83  --   --  0.81 0.74  ?CALCIUM 9.2  --   --  9.3 9.2  ?TSH  --  0.220* 0.894  --  1.825  ? ?Liver Function Tests: ?Recent Labs  ?  05/16/21 ?1147 07/04/21 ?1275 07/25/21 ?0945  ?AST '19 21 20  '$ ?ALT '17 18 18  '$ ?ALKPHOS 67 107 86  ?BILITOT 0.5 0.6  0.6  ?PROT 6.4* 6.7 6.8  ?ALBUMIN 4.2 4.3 4.3  ? ?No results for input(s): LIPASE, AMYLASE in the last 8760 hours. ?No results for input(s): AMMONIA in the last 8760 hours. ?CBC: ?Recent Labs  ?  06/13/21

## 2021-08-09 NOTE — Telephone Encounter (Signed)
Daughter Hosie Spangle is aware that patient will need to hold eliquis 1-2 days prior to procedure & then resume after. ?

## 2021-08-09 NOTE — Telephone Encounter (Signed)
Received fax regarding confirmation for Eliquis (1-2 days hold) from [facility].  ?

## 2021-08-10 DIAGNOSIS — R1313 Dysphagia, pharyngeal phase: Secondary | ICD-10-CM | POA: Diagnosis not present

## 2021-08-10 DIAGNOSIS — R41841 Cognitive communication deficit: Secondary | ICD-10-CM | POA: Diagnosis not present

## 2021-08-10 DIAGNOSIS — Z8673 Personal history of transient ischemic attack (TIA), and cerebral infarction without residual deficits: Secondary | ICD-10-CM | POA: Diagnosis not present

## 2021-08-10 DIAGNOSIS — G3184 Mild cognitive impairment, so stated: Secondary | ICD-10-CM | POA: Diagnosis not present

## 2021-08-20 ENCOUNTER — Other Ambulatory Visit: Payer: Self-pay | Admitting: Oncology

## 2021-08-22 ENCOUNTER — Inpatient Hospital Stay: Payer: Medicare Other

## 2021-08-22 ENCOUNTER — Inpatient Hospital Stay (HOSPITAL_BASED_OUTPATIENT_CLINIC_OR_DEPARTMENT_OTHER): Payer: Medicare Other | Admitting: Oncology

## 2021-08-22 ENCOUNTER — Encounter: Payer: Self-pay | Admitting: *Deleted

## 2021-08-22 VITALS — BP 141/83 | HR 77 | Temp 98.1°F | Resp 18 | Ht 62.0 in | Wt 118.2 lb

## 2021-08-22 DIAGNOSIS — C185 Malignant neoplasm of splenic flexure: Secondary | ICD-10-CM

## 2021-08-22 DIAGNOSIS — E039 Hypothyroidism, unspecified: Secondary | ICD-10-CM | POA: Diagnosis not present

## 2021-08-22 DIAGNOSIS — Z5112 Encounter for antineoplastic immunotherapy: Secondary | ICD-10-CM | POA: Diagnosis not present

## 2021-08-22 DIAGNOSIS — I4891 Unspecified atrial fibrillation: Secondary | ICD-10-CM | POA: Diagnosis not present

## 2021-08-22 DIAGNOSIS — M069 Rheumatoid arthritis, unspecified: Secondary | ICD-10-CM | POA: Diagnosis not present

## 2021-08-22 DIAGNOSIS — D649 Anemia, unspecified: Secondary | ICD-10-CM | POA: Diagnosis not present

## 2021-08-22 LAB — CMP (CANCER CENTER ONLY)
ALT: 20 U/L (ref 0–44)
AST: 22 U/L (ref 15–41)
Albumin: 4.5 g/dL (ref 3.5–5.0)
Alkaline Phosphatase: 84 U/L (ref 38–126)
Anion gap: 10 (ref 5–15)
BUN: 22 mg/dL (ref 8–23)
CO2: 24 mmol/L (ref 22–32)
Calcium: 9.9 mg/dL (ref 8.9–10.3)
Chloride: 99 mmol/L (ref 98–111)
Creatinine: 0.86 mg/dL (ref 0.44–1.00)
GFR, Estimated: >60 mL/min (ref >60)
Glucose, Bld: 98 mg/dL (ref 70–99)
Potassium: 4.2 mmol/L (ref 3.5–5.1)
Sodium: 133 mmol/L — ABNORMAL LOW (ref 135–145)
Total Bilirubin: 0.6 mg/dL (ref 0.3–1.2)
Total Protein: 7.5 g/dL (ref 6.5–8.1)

## 2021-08-22 LAB — CBC WITH DIFFERENTIAL (CANCER CENTER ONLY)
Abs Immature Granulocytes: 0.03 10*3/uL (ref 0.00–0.07)
Basophils Absolute: 0.1 10*3/uL (ref 0.0–0.1)
Basophils Relative: 1 %
Eosinophils Absolute: 0.1 10*3/uL (ref 0.0–0.5)
Eosinophils Relative: 1 %
HCT: 37.6 % (ref 36.0–46.0)
Hemoglobin: 12.4 g/dL (ref 12.0–15.0)
Immature Granulocytes: 0 %
Lymphocytes Relative: 18 %
Lymphs Abs: 1.7 10*3/uL (ref 0.7–4.0)
MCH: 33.2 pg (ref 26.0–34.0)
MCHC: 33 g/dL (ref 30.0–36.0)
MCV: 100.8 fL — ABNORMAL HIGH (ref 80.0–100.0)
Monocytes Absolute: 1.1 10*3/uL — ABNORMAL HIGH (ref 0.1–1.0)
Monocytes Relative: 12 %
Neutro Abs: 6.5 10*3/uL (ref 1.7–7.7)
Neutrophils Relative %: 68 %
Platelet Count: 203 10*3/uL (ref 150–400)
RBC: 3.73 MIL/uL — ABNORMAL LOW (ref 3.87–5.11)
RDW: 13.9 % (ref 11.5–15.5)
WBC Count: 9.4 10*3/uL (ref 4.0–10.5)
nRBC: 0 % (ref 0.0–0.2)

## 2021-08-22 MED ORDER — SODIUM CHLORIDE 0.9 % IV SOLN
200.0000 mg | Freq: Once | INTRAVENOUS | Status: AC
  Administered 2021-08-22: 200 mg via INTRAVENOUS
  Filled 2021-08-22: qty 8

## 2021-08-22 MED ORDER — SODIUM CHLORIDE 0.9 % IV SOLN: Freq: Once | INTRAVENOUS | Status: AC

## 2021-08-22 NOTE — Progress Notes (Signed)
  [facility] OFFICE PROGRESS NOTE   Diagnosis: Colon cancer  INTERVAL HISTORY:   Kathy Velez completed another treatment with pembrolizumab on 07/25/2021.  No nausea.  Good appetite.  She continues iron.  No bleeding.  There is persistent erythema and desquamation at the palms.  She uses hydrocortisone cream.  No other rash.  She had diarrhea last week.  She normally has frequent bowel movements and soft stool.  This has improved over the past few days.  She is here today with her daughter.  She has noted an increase in arthritis pain at the hands.  She has increased malaise.  Objective:  Vital signs in last 24 hours:  Blood pressure (!) 141/83, pulse 77, temperature 98.1 F (36.7 C), temperature source Oral, resp. rate 18, height _0  (1.575 m), weight 118 lb 3.2 oz (53.6 kg), SpO2 98 %.    HEENT: No thrush or ulcers Resp: Lungs clear bilaterally Cardio: Irregular GI: No hepatosplenomegaly, nontender Vascular: No leg edema  Skin: Erythema with superficial desquamation at the palms   Lab Results:  Lab Results  Component Value Date   WBC 9.4 08/22/2021   HGB 12.4 08/22/2021   HCT 37.6 08/22/2021   MCV 100.8 (H) 08/22/2021   PLT 203 08/22/2021   NEUTROABS 6.5 08/22/2021    CMP  Lab Results  Component Value Date   NA 133 (L) 08/22/2021   K 4.2 08/22/2021   CL 99 08/22/2021   CO2 24 08/22/2021   GLUCOSE 98 08/22/2021   BUN 22 08/22/2021   CREATININE 0.86 08/22/2021   CALCIUM 9.9 08/22/2021   PROT 7.5 08/22/2021   ALBUMIN 4.5 08/22/2021   AST 22 08/22/2021   ALT 20 08/22/2021   ALKPHOS 84 08/22/2021   BILITOT 0.6 08/22/2021   GFRNONAA >60 08/22/2021   GFRAA 79.35 02/02/2021    Lab Results  Component Value Date   CEA 3.83 05/16/2021     Medications: I have reviewed the patient's current medications.   Assessment/Plan: Colon cancer-splenic flexure mass on colonoscopy 02/22/2021 Biopsy 02/22/2021-invasive well differentiated adenocarcinoma,  loss of MLH1 and PMS2 expression, MSI high, MLH1 hyper methylation present CTs 03/31/2021-no evidence of metastatic disease, possible mass at the splenic flexure, 4 mm left upper lung nodule, bilateral lung scarring Cycle 1 pembrolizumab 06/13/2021 Cycle 2 pembrolizumab 07/04/2021 Cycle 3 pembrolizumab 07/25/2021 Cycle 4 pembrolizumab 08/22/2021 Macrocytic anemia Sessile serrated polyp with focus of high-grade dysplasia on colonoscopy 02/22/2021 Colon cancer 2006, status post a right colectomy-"node positive "cancer per patient, received adjuvant chemotherapy Atrial fibrillation, maintained on anticoagulation History of a CVA and TIA Mild cognitive impairment Rheumatoid arthritis Hypothyroidism 10.  Chronic lung disease?      Disposition: Kathy Velez has completed 3 treatments with pembrolizumab.  She has tolerated the treatment well.  Anemia has improved.  I doubt the recent loose stool is related to toxicity from pembrolizumab, but this is possible.  Her daughter will contact us if this becomes a consistent problem.  She will continue hydrocortisone for treatment of the hand rash.  She is scheduled for follow-up with rheumatology tomorrow.  She will undergo a restaging colonoscopy on 09/20/2021.  She will return for an office visit with the plan to continue pembrolizumab on 10/02/2021.   Betsy Coder, MD  08/22/2021  1:26 PM

## 2021-08-22 NOTE — Progress Notes (Signed)
Patient seen by Dr. Sherrill today ? ?Vitals are within treatment parameters. ? ?Labs reviewed by Dr. Sherrill and are within treatment parameters. ? ?Per physician team, patient is ready for treatment and there are NO modifications to the treatment plan.  ?

## 2021-08-22 NOTE — Patient Instructions (Signed)
[facility]  Discharge Instructions: Thank you for choosing [facility] to provide your oncology and hematology care.   If you have a lab appointment with the Cancer Center, please go directly to the Cancer Center and check in at the registration area.   Wear comfortable clothing and clothing appropriate for easy access to any Portacath or PICC line.   We strive to give you quality time with your provider. You may need to reschedule your appointment if you arrive late (15 or more minutes).  Arriving late affects you and other patients whose appointments are after yours.  Also, if you miss three or more appointments without notifying the office, you may be dismissed from the clinic at the provider's discretion.      For prescription refill requests, have your pharmacy contact our office and allow 72 hours for refills to be completed.    Today you received the following chemotherapy and/or immunotherapy agents Keytruda      To help prevent nausea and vomiting after your treatment, we encourage you to take your nausea medication as directed.  BELOW ARE SYMPTOMS THAT SHOULD BE REPORTED IMMEDIATELY: *FEVER GREATER THAN 100.4 F (38 C) OR HIGHER *CHILLS OR SWEATING *NAUSEA AND VOMITING THAT IS NOT CONTROLLED WITH YOUR NAUSEA MEDICATION *UNUSUAL SHORTNESS OF BREATH *UNUSUAL BRUISING OR BLEEDING *URINARY PROBLEMS (pain or burning when urinating, or frequent urination) *BOWEL PROBLEMS (unusual diarrhea, constipation, pain near the anus) TENDERNESS IN MOUTH AND THROAT WITH OR WITHOUT PRESENCE OF ULCERS (sore throat, sores in mouth, or a toothache) UNUSUAL RASH, SWELLING OR PAIN  UNUSUAL VAGINAL DISCHARGE OR ITCHING   Items with * indicate a potential emergency and should be followed up as soon as possible or go to the Emergency Department if any problems should occur.  Please show the CHEMOTHERAPY ALERT CARD or IMMUNOTHERAPY ALERT CARD at check-in to the  Emergency Department and triage nurse.  Should you have questions after your visit or need to cancel or reschedule your appointment, please contact [facility]  Dept: [phone]  and follow the prompts.  Office hours are 8:00 a.m. to 4:30 p.m. Monday - Friday. Please note that voicemails left after 4:00 p.m. may not be returned until the following business day.  We are closed weekends and major holidays. You have access to a nurse at all times for urgent questions. Please call the main number to the clinic Dept: [phone] and follow the prompts.   For any non-urgent questions, you may also contact your provider using MyChart. We now offer e-Visits for anyone 18 and older to request care online for non-urgent symptoms. For details visit [email or web address].   Also download the MyChart app! Go to the app store, search "MyChart", open the app, select , and log in with your MyChart username and password.  Due to Covid, a mask is required upon entering the hospital/clinic. If you do not have a mask, one will be given to you upon arrival. For doctor visits, patients may have 1 support person aged 18 or older with them. For treatment visits, patients cannot have anyone with them due to current Covid guidelines and our immunocompromised population.   

## 2021-08-22 NOTE — Progress Notes (Signed)
Patient presents for treatment. RN assessment completed along with the following:  Labs/vitals reviewed - Yes, and within treatment parameters.   Weight within 10% of previous measurement - Yes Informed consent completed and reflects current therapy/intent - Yes, on date 06/13/21             Provider progress note reviewed - Yes, today's provider note was reviewed. Treatment/Antibody/Supportive plan reviewed - Yes, and there are no adjustments needed for today's treatment. S&H and other orders reviewed - Yes, and there are no additional orders identified. Previous treatment date reviewed - Yes, and the appropriate amount of time has elapsed between treatments. Clinic Hand Off Received from - Cristy Friedlander, RN  Patient to proceed with treatment.

## 2021-08-23 DIAGNOSIS — M1991 Primary osteoarthritis, unspecified site: Secondary | ICD-10-CM | POA: Diagnosis not present

## 2021-08-23 DIAGNOSIS — M8580 Other specified disorders of bone density and structure, unspecified site: Secondary | ICD-10-CM | POA: Diagnosis not present

## 2021-08-23 DIAGNOSIS — M0579 Rheumatoid arthritis with rheumatoid factor of multiple sites without organ or systems involvement: Secondary | ICD-10-CM | POA: Diagnosis not present

## 2021-08-23 DIAGNOSIS — M5136 Other intervertebral disc degeneration, lumbar region: Secondary | ICD-10-CM | POA: Diagnosis not present

## 2021-08-23 DIAGNOSIS — Z6822 Body mass index (BMI) 22.0-22.9, adult: Secondary | ICD-10-CM | POA: Diagnosis not present

## 2021-08-23 DIAGNOSIS — Z79899 Other long term (current) drug therapy: Secondary | ICD-10-CM | POA: Diagnosis not present

## 2021-08-25 DIAGNOSIS — Z8673 Personal history of transient ischemic attack (TIA), and cerebral infarction without residual deficits: Secondary | ICD-10-CM | POA: Diagnosis not present

## 2021-08-25 DIAGNOSIS — G3184 Mild cognitive impairment, so stated: Secondary | ICD-10-CM | POA: Diagnosis not present

## 2021-08-25 DIAGNOSIS — R1313 Dysphagia, pharyngeal phase: Secondary | ICD-10-CM | POA: Diagnosis not present

## 2021-08-25 DIAGNOSIS — R41841 Cognitive communication deficit: Secondary | ICD-10-CM | POA: Diagnosis not present

## 2021-08-28 ENCOUNTER — Telehealth: Payer: Self-pay | Admitting: *Deleted

## 2021-08-28 NOTE — Telephone Encounter (Signed)
Facility is administering new COVID boosters and are inquiring if OK for Kathy Velez to receive? OK per Dr. Benay Spice.

## 2021-09-01 DIAGNOSIS — G3184 Mild cognitive impairment, so stated: Secondary | ICD-10-CM | POA: Diagnosis not present

## 2021-09-01 DIAGNOSIS — Z8673 Personal history of transient ischemic attack (TIA), and cerebral infarction without residual deficits: Secondary | ICD-10-CM | POA: Diagnosis not present

## 2021-09-01 DIAGNOSIS — R1313 Dysphagia, pharyngeal phase: Secondary | ICD-10-CM | POA: Diagnosis not present

## 2021-09-01 DIAGNOSIS — R41841 Cognitive communication deficit: Secondary | ICD-10-CM | POA: Diagnosis not present

## 2021-09-05 ENCOUNTER — Ambulatory Visit (AMBULATORY_SURGERY_CENTER): Payer: Medicare Other | Admitting: *Deleted

## 2021-09-05 ENCOUNTER — Encounter: Payer: Self-pay | Admitting: Oncology

## 2021-09-05 VITALS — Ht 62.0 in | Wt 115.0 lb

## 2021-09-05 DIAGNOSIS — Z85038 Personal history of other malignant neoplasm of large intestine: Secondary | ICD-10-CM

## 2021-09-05 MED ORDER — NA SULFATE-K SULFATE-MG SULF 17.5-3.13-1.6 GM/177ML PO SOLN: 1.0000 | Freq: Once | ORAL | 0 refills | Status: AC

## 2021-09-05 NOTE — Progress Notes (Signed)
No egg or soy allergy known to patient  ?No issues known to pt with past sedation with any surgeries or procedures ?Patient denies ever being told they had issues or difficulty with intubation  ?No FH of Malignant Hyperthermia ?Pt is not on diet pills ?Pt is not on  home 02  ?Pt is not on blood thinners  ?Pt denies issues with constipation  ?No A fib or A flutter ? ?suprep Coupon to pt in PV today , Code to Pharmacy and  NO PA's for preps discussed with pt In PV today  ?Discussed with pt there will be an out-of-pocket cost for prep and that varies from $0 to 70 +  dollars - pt verbalized understanding  ?Pt instructed to use Singlecare.com or GoodRx for a price reduction on prep  ? ?PV completed over the phone. Pt verified name, DOB, address and insurance during PV today.  ?Pt mailed instruction packet with copy of consent form to read and not return, and instructions.  ?Pt encouraged to call with questions or issues.  ?If pt has My chart, procedure instructions sent via My Chart  ?Insurance confirmed with pt at PV today   ?

## 2021-09-06 ENCOUNTER — Ambulatory Visit: Payer: Medicare Other | Admitting: Physician Assistant

## 2021-09-07 DIAGNOSIS — R1313 Dysphagia, pharyngeal phase: Secondary | ICD-10-CM | POA: Diagnosis not present

## 2021-09-07 DIAGNOSIS — G3184 Mild cognitive impairment, so stated: Secondary | ICD-10-CM | POA: Diagnosis not present

## 2021-09-07 DIAGNOSIS — Z8673 Personal history of transient ischemic attack (TIA), and cerebral infarction without residual deficits: Secondary | ICD-10-CM | POA: Diagnosis not present

## 2021-09-07 DIAGNOSIS — R41841 Cognitive communication deficit: Secondary | ICD-10-CM | POA: Diagnosis not present

## 2021-09-09 ENCOUNTER — Other Ambulatory Visit: Payer: Self-pay | Admitting: Oncology

## 2021-09-11 ENCOUNTER — Telehealth: Payer: Self-pay | Admitting: *Deleted

## 2021-09-11 NOTE — Telephone Encounter (Signed)
Per Dr. Benay Spice: Cancel all appointments on 6/22 and keep 7/11 appointments. Having colonoscopy on 628.  Notified daughter via VM and scheduling message sent.

## 2021-09-12 ENCOUNTER — Encounter: Payer: Self-pay | Admitting: Oncology

## 2021-09-14 ENCOUNTER — Other Ambulatory Visit: Payer: Self-pay | Admitting: Internal Medicine

## 2021-09-14 ENCOUNTER — Inpatient Hospital Stay: Payer: Medicare Other

## 2021-09-14 ENCOUNTER — Inpatient Hospital Stay: Payer: Medicare Other | Admitting: Oncology

## 2021-09-20 ENCOUNTER — Encounter: Payer: Self-pay | Admitting: Gastroenterology

## 2021-09-20 ENCOUNTER — Ambulatory Visit (AMBULATORY_SURGERY_CENTER): Payer: Medicare Other | Admitting: Gastroenterology

## 2021-09-20 VITALS — BP 138/75 | HR 68 | Temp 98.6°F | Resp 19 | Ht 62.0 in | Wt 115.0 lb

## 2021-09-20 DIAGNOSIS — Z85038 Personal history of other malignant neoplasm of large intestine: Secondary | ICD-10-CM | POA: Diagnosis not present

## 2021-09-20 DIAGNOSIS — K640 First degree hemorrhoids: Secondary | ICD-10-CM | POA: Diagnosis not present

## 2021-09-20 DIAGNOSIS — Z08 Encounter for follow-up examination after completed treatment for malignant neoplasm: Secondary | ICD-10-CM | POA: Diagnosis not present

## 2021-09-20 DIAGNOSIS — K5730 Diverticulosis of large intestine without perforation or abscess without bleeding: Secondary | ICD-10-CM | POA: Diagnosis not present

## 2021-09-20 MED ORDER — SODIUM CHLORIDE 0.9 % IV SOLN: 500.0000 mL | Freq: Once | INTRAVENOUS | Status: DC

## 2021-09-20 NOTE — Progress Notes (Signed)
See 08/22/2021 H&P, no changes

## 2021-09-20 NOTE — Op Note (Signed)
[facility] Patient Name: Kathy Velez Procedure Date: 09/20/2021 10:12 AM MRN: [id number] Endoscopist: Ladene Artist , MD Age: 88 Referring MD:  Date of Birth: May 07, 1931 Gender: Female Account #: [id number] Procedure:                Colonoscopy Indications:              High risk colon cancer surveillance: Personal                            history of colon cancer Medicines:                Monitored Anesthesia Care Procedure:                Pre-Anesthesia Assessment:                           - Prior to the procedure, a History and Physical                            was performed, and patient medications and                            allergies were reviewed. The patient's tolerance of                            previous anesthesia was also reviewed. The risks                            and benefits of the procedure and the sedation                            options and risks were discussed with the patient.                            All questions were answered, and informed consent                            was obtained. Prior Anticoagulants: The patient has                            taken Eliquis (apixaban), last dose was 2 days                            prior to procedure. ASA Grade Assessment: III - A                            patient with severe systemic disease. After                            reviewing the risks and benefits, the patient was                            deemed in satisfactory condition to undergo the  procedure.                           After obtaining informed consent, the colonoscope                            was passed under direct vision. Throughout the                            procedure, the patient's blood pressure, pulse, and                            oxygen saturations were monitored continuously. The                            Olympus PCF-H190DL (EQ#[id number]) Colonoscope was                             introduced through the anus and advanced to the the                            ileocolonic anastomosis. The colonoscopy was                            somewhat difficult due to a redundant colon and a                            tortuous colon. The patient tolerated the procedure                            well. Scope In: 10:20:55 AM Scope Out: 10:41:57 AM Scope Withdrawal Time: 0 hours 14 minutes 40 seconds  Total Procedure Duration: 0 hours 21 minutes 2 seconds  Findings:                 The perianal and digital rectal examinations were                            normal.                           There was evidence of a prior end-to-side                            ileo-colonic anastomosis in the transverse colon.                            This was patent and was characterized by healthy                            appearing mucosa. The anastomosis was not traversed.                           A tattoo was seen in the transverse colon. A  post-polypectomy scar was found at the tattoo site.                           A tattoo was seen in the descending colon. The                            tattoo site appeared normal.                           Multiple medium-mouthed diverticula were found in                            the left colon. There was narrowing of the colon in                            association with the diverticular opening. There                            was evidence of diverticular spasm. There was no                            evidence of diverticular bleeding.                           Internal hemorrhoids were found. The hemorrhoids                            were small and Grade I (internal hemorrhoids that                            do not prolapse).                           The exam was otherwise normal throughout the                            examined colon. Complications:            No immediate complications. Estimated Blood Loss:      Estimated blood loss: none. Impression:               - Patent end-to-side ileo-colonic anastomosis,                            characterized by healthy appearing mucosa.                           - A tattoo was seen in the transverse colon. A                            post-polypectomy scar was found at the tattoo site.                           - A tattoo was seen in the descending colon. The  tattoo site appeared normal.                           - Moderate diverticulosis in the left colon.                           - Internal hemorrhoids.                           - No specimens collected. Recommendation:           - Patient has a contact number available for                            emergencies. The signs and symptoms of potential                            delayed complications were discussed with the                            patient. Return to normal activities tomorrow.                            Written discharge instructions were provided to the                            patient.                           - High fiber diet.                           - Continue present medications.                           - No evidence of colon cancer in descending colon,                            splenic flexure.                           - No evidence of polyp removed piecemeal in the                            transverse colon.                           - No recommendation at this time regarding repeat                            colonoscopy due to age.                           - Resume Eliquis (apixaban) at prior dose tomorrow.                            Refer to managing physician for further adjustment  of therapy. Ladene Artist, MD 09/20/2021 10:52:44 AM This report has been signed electronically.

## 2021-09-20 NOTE — Patient Instructions (Signed)
YOU HAD AN ENDOSCOPIC PROCEDURE TODAY AT THE [facility]:   Refer to the procedure report that was given to you for any specific questions about what was found during the examination.  If the procedure report does not answer your questions, please call your gastroenterologist to clarify.  If you requested that your care partner not be given the details of your procedure findings, then the procedure report has been included in a sealed envelope for you to review at your convenience later.  YOU SHOULD EXPECT: Some feelings of bloating in the abdomen. Passage of more gas than usual.  Walking can help get rid of the air that was put into your GI tract during the procedure and reduce the bloating. If you had a lower endoscopy (such as a colonoscopy or flexible sigmoidoscopy) you may notice spotting of blood in your stool or on the toilet paper. If you underwent a bowel prep for your procedure, you may not have a normal bowel movement for a few days.  Please Note:  You might notice some irritation and congestion in your nose or some drainage.  This is from the oxygen used during your procedure.  There is no need for concern and it should clear up in a day or so.  SYMPTOMS TO REPORT IMMEDIATELY:  Following lower endoscopy (colonoscopy or flexible sigmoidoscopy):  Excessive amounts of blood in the stool  Significant tenderness or worsening of abdominal pains  Swelling of the abdomen that is new, acute  Fever of 100F or higher  Following upper endoscopy (EGD)  Vomiting of blood or coffee ground material  New chest pain or pain under the shoulder blades  Painful or persistently difficult swallowing  New shortness of breath  Fever of 100F or higher  Black, tarry-looking stools  For urgent or emergent issues, a gastroenterologist can be reached at any hour by calling [phone]. Do not use MyChart messaging for urgent concerns.    DIET:  We do recommend a small meal at first, but  then you may proceed to your regular diet.  Drink plenty of fluids but you should avoid alcoholic beverages for 24 hours.  ACTIVITY:  You should plan to take it easy for the rest of today and you should NOT DRIVE or use heavy machinery until tomorrow (because of the sedation medicines used during the test).    FOLLOW UP: Our staff will call the number listed on your records the next business day following your procedure.  We will call around 7:15- 8:00 am to check on you and address any questions or concerns that you may have regarding the information given to you following your procedure. If we do not reach you, we will leave a message.  If you develop any symptoms (ie: fever, flu-like symptoms, shortness of breath, cough etc.) before then, please call [phone].  If you test positive for Covid 19 in the 2 weeks post procedure, please call and report this information to us.    If any biopsies were taken you will be contacted by phone or by letter within the next 1-3 weeks.  Please call us at [phone] if you have not heard about the biopsies in 3 weeks.    SIGNATURES/CONFIDENTIALITY: You and/or your care partner have signed paperwork which will be entered into your electronic medical record.  These signatures attest to the fact that that the information above on your After Visit Summary has been reviewed and is understood.  Full responsibility of the confidentiality   of this discharge information lies with you and/or your care-partner.  

## 2021-09-20 NOTE — Progress Notes (Signed)
Report to pacu rn; vss. Care resumed by pacu rn 

## 2021-09-20 NOTE — Progress Notes (Signed)
Pt's states no medical or surgical changes since previsit or office visit. 

## 2021-09-21 ENCOUNTER — Telehealth: Payer: Self-pay

## 2021-09-21 NOTE — Telephone Encounter (Signed)
  Follow up Call-     09/20/2021    9:52 AM 02/22/2021    2:39 PM  Call back number  Post procedure Call Back phone  # [phone]  Permission to leave phone message Yes Yes     Patient questions:  Do you have a fever, pain , or abdominal swelling? No. Pain Score  0 *  Have you tolerated food without any problems? Yes.    Have you been able to return to your normal activities? Yes.    Do you have any questions about your discharge instructions: Diet   No. Medications  No. Follow up visit  No.  Do you have questions or concerns about your Care? No.  Actions: * If pain score is 4 or above: No action needed, pain <4.

## 2021-09-30 ENCOUNTER — Other Ambulatory Visit: Payer: Self-pay | Admitting: Oncology

## 2021-10-03 ENCOUNTER — Inpatient Hospital Stay: Payer: Medicare Other

## 2021-10-03 ENCOUNTER — Inpatient Hospital Stay (HOSPITAL_BASED_OUTPATIENT_CLINIC_OR_DEPARTMENT_OTHER): Payer: Medicare Other | Admitting: Oncology

## 2021-10-03 ENCOUNTER — Encounter: Payer: Self-pay | Admitting: *Deleted

## 2021-10-03 ENCOUNTER — Telehealth: Payer: Self-pay

## 2021-10-03 ENCOUNTER — Inpatient Hospital Stay: Payer: Medicare Other | Attending: Oncology

## 2021-10-03 VITALS — BP 138/85 | HR 71 | Temp 98.2°F | Resp 18 | Ht 62.0 in | Wt 117.4 lb

## 2021-10-03 DIAGNOSIS — C185 Malignant neoplasm of splenic flexure: Secondary | ICD-10-CM | POA: Diagnosis not present

## 2021-10-03 DIAGNOSIS — Z7901 Long term (current) use of anticoagulants: Secondary | ICD-10-CM | POA: Diagnosis not present

## 2021-10-03 DIAGNOSIS — I4891 Unspecified atrial fibrillation: Secondary | ICD-10-CM | POA: Insufficient documentation

## 2021-10-03 DIAGNOSIS — Z8673 Personal history of transient ischemic attack (TIA), and cerebral infarction without residual deficits: Secondary | ICD-10-CM | POA: Diagnosis not present

## 2021-10-03 DIAGNOSIS — R5383 Other fatigue: Secondary | ICD-10-CM

## 2021-10-03 DIAGNOSIS — D649 Anemia, unspecified: Secondary | ICD-10-CM | POA: Diagnosis not present

## 2021-10-03 DIAGNOSIS — G3184 Mild cognitive impairment, so stated: Secondary | ICD-10-CM | POA: Insufficient documentation

## 2021-10-03 DIAGNOSIS — M069 Rheumatoid arthritis, unspecified: Secondary | ICD-10-CM | POA: Insufficient documentation

## 2021-10-03 DIAGNOSIS — E039 Hypothyroidism, unspecified: Secondary | ICD-10-CM | POA: Insufficient documentation

## 2021-10-03 LAB — CMP (CANCER CENTER ONLY)
ALT: 18 U/L (ref 0–44)
AST: 22 U/L (ref 15–41)
Albumin: 4.5 g/dL (ref 3.5–5.0)
Alkaline Phosphatase: 75 U/L (ref 38–126)
Anion gap: 11 (ref 5–15)
BUN: 18 mg/dL (ref 8–23)
CO2: 21 mmol/L — ABNORMAL LOW (ref 22–32)
Calcium: 9.9 mg/dL (ref 8.9–10.3)
Chloride: 99 mmol/L (ref 98–111)
Creatinine: 0.79 mg/dL (ref 0.44–1.00)
GFR, Estimated: >60 mL/min (ref >60)
Glucose, Bld: 113 mg/dL — ABNORMAL HIGH (ref 70–99)
Potassium: 4.6 mmol/L (ref 3.5–5.1)
Sodium: 131 mmol/L — ABNORMAL LOW (ref 135–145)
Total Bilirubin: 0.8 mg/dL (ref 0.3–1.2)
Total Protein: 7.1 g/dL (ref 6.5–8.1)

## 2021-10-03 LAB — CBC WITH DIFFERENTIAL (CANCER CENTER ONLY)
Abs Immature Granulocytes: 0.03 10*3/uL (ref 0.00–0.07)
Basophils Absolute: 0.1 10*3/uL (ref 0.0–0.1)
Basophils Relative: 1 %
Eosinophils Absolute: 0.1 10*3/uL (ref 0.0–0.5)
Eosinophils Relative: 1 %
HCT: 32.8 % — ABNORMAL LOW (ref 36.0–46.0)
Hemoglobin: 11.3 g/dL — ABNORMAL LOW (ref 12.0–15.0)
Immature Granulocytes: 0 %
Lymphocytes Relative: 16 %
Lymphs Abs: 1.3 10*3/uL (ref 0.7–4.0)
MCH: 33.4 pg (ref 26.0–34.0)
MCHC: 34.5 g/dL (ref 30.0–36.0)
MCV: 97 fL (ref 80.0–100.0)
Monocytes Absolute: 1.2 10*3/uL — ABNORMAL HIGH (ref 0.1–1.0)
Monocytes Relative: 15 %
Neutro Abs: 5.4 10*3/uL (ref 1.7–7.7)
Neutrophils Relative %: 67 %
Platelet Count: 220 10*3/uL (ref 150–400)
RBC: 3.38 MIL/uL — ABNORMAL LOW (ref 3.87–5.11)
RDW: 15 % (ref 11.5–15.5)
WBC Count: 8 10*3/uL (ref 4.0–10.5)
nRBC: 0 % (ref 0.0–0.2)

## 2021-10-03 LAB — TSH: TSH: 7.923 u[IU]/mL — ABNORMAL HIGH (ref 0.350–4.500)

## 2021-10-03 LAB — CORTISOL: Cortisol, Plasma: 13.7 ug/dL

## 2021-10-03 NOTE — Telephone Encounter (Signed)
Dr Benay Spice requested added labs T3,T4,cortisol lab contacted to add orders.

## 2021-10-03 NOTE — Progress Notes (Signed)
Patient seen by Dr. Sherrill today  Vitals are within treatment parameters.  Labs reviewed by Dr. Sherrill and are within treatment parameters.  Per physician team, patient will not be receiving treatment today.  

## 2021-10-03 NOTE — Progress Notes (Signed)
[facility] OFFICE PROGRESS NOTE   Diagnosis: Colon cancer  INTERVAL HISTORY:   Kathy Velez was last treated with pembrolizumab on 08/22/2021.  The palmar erythema and skin thickening has improved.  No diarrhea.  Arthritis symptoms are stable.  She complains of a dry mouth. She reports increased malaise.  She now takes a nap in the afternoon.  No bleeding.  She underwent a colonoscopy by Dr. Fuller Plan on 09/20/2021.  A tattoo was seen in the descending colon.  The tattoo site appeared normal.  The transverse colon polypectomy site had a scar and tattoo.    Objective:  Vital signs in last 24 hours:  Blood pressure 138/85, pulse 71, temperature 98.2 F (36.8 C), temperature source Oral, resp. rate 18, height _0  (1.575 m), weight 117 lb 6.4 oz (53.3 kg), SpO2 95 %.    HEENT: No thrush or ulcers, the mucous membranes are moist Resp: Lungs clear bilaterally Cardio: Regular rate and rhythm GI: No hepatosplenomegaly, nontender Vascular: No leg edema  Skin: Mild palmar erythema and skin thickening  Lab Results:  Lab Results  Component Value Date   WBC 8.0 10/03/2021   HGB 11.3 (L) 10/03/2021   HCT 32.8 (L) 10/03/2021   MCV 97.0 10/03/2021   PLT 220 10/03/2021   NEUTROABS 5.4 10/03/2021    CMP  Lab Results  Component Value Date   NA 131 (L) 10/03/2021   K 4.6 10/03/2021   CL 99 10/03/2021   CO2 21 (L) 10/03/2021   GLUCOSE 113 (H) 10/03/2021   BUN 18 10/03/2021   CREATININE 0.79 10/03/2021   CALCIUM 9.9 10/03/2021   PROT 7.1 10/03/2021   ALBUMIN 4.5 10/03/2021   AST 22 10/03/2021   ALT 18 10/03/2021   ALKPHOS 75 10/03/2021   BILITOT 0.8 10/03/2021   GFRNONAA >60 10/03/2021   GFRAA 79.35 02/02/2021    Lab Results  Component Value Date   CEA 3.83 05/16/2021   Medications: I have reviewed the patient's current medications.   Assessment/Plan: Colon cancer-splenic flexure mass on colonoscopy 02/22/2021 Biopsy 02/22/2021-invasive well differentiated  adenocarcinoma, loss of MLH1 and PMS2 expression, MSI high, MLH1 hyper methylation present CTs 03/31/2021-no evidence of metastatic disease, possible mass at the splenic flexure, 4 mm left upper lung nodule, bilateral lung scarring Cycle 1 pembrolizumab 06/13/2021 Cycle 2 pembrolizumab 07/04/2021 Cycle 3 pembrolizumab 07/25/2021 Cycle 4 pembrolizumab 08/22/2021 Colonoscopy 09/20/2021-no residual mass at the splenic flexure/descending colon Macrocytic anemia Sessile serrated polyp with focus of high-grade dysplasia on colonoscopy 02/22/2021 Colon cancer 2006, status post a right colectomy-"node positive "cancer per patient, received adjuvant chemotherapy Atrial fibrillation, maintained on anticoagulation History of a CVA and TIA Mild cognitive impairment Rheumatoid arthritis Hypothyroidism 10.  Chronic lung disease?    Disposition: Kathy Velez has completed 4 treatments with pembrolizumab.  The restaging colonoscopy revealed resolution of the splenic flexure mass.  Anemia has corrected significantly since beginning treatment with pembrolizumab.  I discussed treatment options with Kathy Velez and her daughter.  She has experienced a complete clinical response with 4 cycles of pembrolizumab.  I would generally recommend additional treatment with pembrolizumab, but she appears to have significant toxicities related to pembrolizumab.  Her quality of life has declined over the past month.  She and her daughter agree to a treatment break.  We discussed her age, comorbid condition, and current symptoms while making this decision.  She developed a rash over the hands during pembrolizumab therapy.  She now has malaise, the TSH is mildly elevated.  We will check T3, T4, cortisol levels today.  The dry mouth may be related to sicca syndrome due to pembrolizumab.  She will try an over-the-counter artificial saliva.  We will follow-up on the above levels and adjust her medical regimen as indicated.  She will return  for a lab visit in 1 month.  She will be scheduled for an office visit during the week of 12/11/2021.  Her daughter will contact us in the interim if she has new symptoms.  Betsy Coder, MD  10/03/2021  1:39 PM

## 2021-10-04 LAB — T4: T4, Total: 6.4 ug/dL (ref 4.5–12.0)

## 2021-10-04 LAB — T3: T3, Total: 54 ng/dL — ABNORMAL LOW (ref 71–180)

## 2021-10-05 DIAGNOSIS — R1313 Dysphagia, pharyngeal phase: Secondary | ICD-10-CM | POA: Diagnosis not present

## 2021-10-05 DIAGNOSIS — R41841 Cognitive communication deficit: Secondary | ICD-10-CM | POA: Diagnosis not present

## 2021-10-05 DIAGNOSIS — Z8673 Personal history of transient ischemic attack (TIA), and cerebral infarction without residual deficits: Secondary | ICD-10-CM | POA: Diagnosis not present

## 2021-10-05 DIAGNOSIS — G3184 Mild cognitive impairment, so stated: Secondary | ICD-10-CM | POA: Diagnosis not present

## 2021-10-09 ENCOUNTER — Encounter: Payer: Self-pay | Admitting: Internal Medicine

## 2021-10-11 ENCOUNTER — Other Ambulatory Visit: Payer: Self-pay | Admitting: *Deleted

## 2021-10-11 ENCOUNTER — Telehealth: Payer: Self-pay | Admitting: *Deleted

## 2021-10-11 DIAGNOSIS — E039 Hypothyroidism, unspecified: Secondary | ICD-10-CM

## 2021-10-11 NOTE — Telephone Encounter (Signed)
Informed daughter that TSH is high, which could mean hypothyroid. Dr. Benay Spice wanted to add free T4 today, but it could not be added due to sample being discarded. Will draw on 8/15 lab unless she feels it needs to be done sooner. Her serum cortisol was normal. Daughter agrees to wait for 8/15 lab, but will call if her mother's fatigue worsens.

## 2021-10-11 NOTE — Progress Notes (Signed)
MD requesting to add Free T4 to labs collected on 10/03/21--per lab, sample has been discarded.

## 2021-10-14 IMAGING — CT CT ANGIO NECK
1 of 4 series · 2 of 16 positions shown · IV contrast (APPLIED)
Comparison: MRI head 04/20/2019

CLINICAL DATA: Aphasia.  Rule out stroke.  History of colon cancer.

Creatinine was obtained on site at [HOSPITAL] at [HOSPITAL].
Results: Creatinine 0.8 mg/dL.
EXAM:
CT ANGIOGRAPHY HEAD AND NECK
TECHNIQUE: Multidetector CT imaging of the head and neck was performed using
the standard protocol during bolus administration of intravenous
contrast. Multiplanar CT image reconstructions and MIPs were
obtained to evaluate the vascular anatomy. Carotid stenosis
measurements (when applicable) are obtained utilizing NASCET
criteria, using the distal internal carotid diameter as the
denominator.
CONTRAST:  75mL [id number] IOPAMIDOL ([id number]) INJECTION 76%

[Series 7: head/neck angio · axial · 0.40mm/px · z∈[-268,-150]mm · 2 of 177 slices shown]
[im 59/177  soft-tissue]
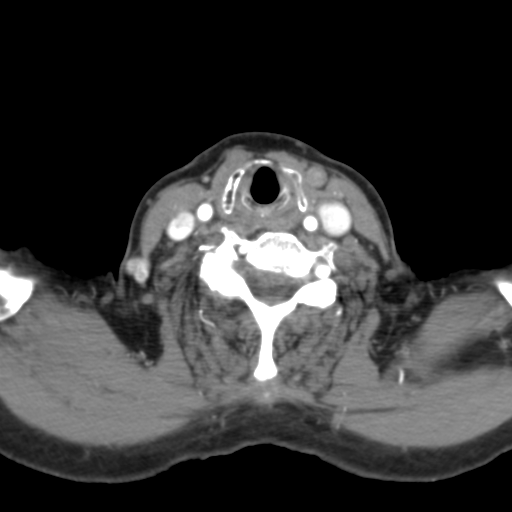
[im 118/177  bone]
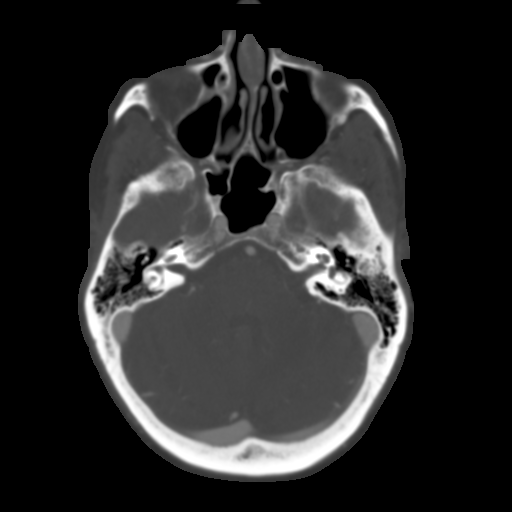

[2 of 16 positions shown; findings below may reference images not displayed]

FINDINGS: CT HEAD FINDINGS

Brain: Mild atrophy unchanged. Mild chronic microvascular ischemic
changes in the white matter. No acute infarct, hemorrhage, mass.

Vascular: Mild atherosclerotic calcification. Negative for
hyperdense vessel

Skull: No focal skeletal lesion

Sinuses: Paranasal sinuses clear.

Orbits: Bilateral cataract surgery.  No orbital mass.

Review of the MIP images confirms the above findings

CTA NECK FINDINGS

Aortic arch: Standard branching. Imaged portion shows no evidence of
aneurysm or dissection. No significant stenosis of the major arch
vessel origins.

Right carotid system: Mild atherosclerotic calcification of the
carotid bifurcation without significant stenosis.

Left carotid system: Atherosclerotic calcification left carotid
bifurcation without significant stenosis.

Vertebral arteries: Both vertebral arteries widely patent without
significant stenosis.

Skeleton: Advanced cervical spondylosis. Spondylolisthesis C2-3,
C3-4, C4-5, C5-6. Prominent kyphosis in the cervical spine.
Multilevel spinal and foraminal stenosis due to spurring.

Other neck: Negative for mass or adenopathy.

Upper chest: Pleuroparenchymal scarring in the lung apices
bilaterally.

Review of the MIP images confirms the above findings

CTA HEAD FINDINGS

Anterior circulation: Atherosclerotic calcification in the cavernous
carotid bilaterally without significant stenosis. Anterior and
middle cerebral arteries patent bilaterally without stenosis.

Posterior circulation: Both vertebral arteries patent to the basilar
without stenosis. Mild atherosclerotic disease distal left vertebral
artery. PICA patent bilaterally. Basilar widely patent. Superior
cerebellar and posterior cerebral arteries patent bilaterally
without stenosis.

Venous sinuses: Normal venous enhancement.

Anatomic variants: None

Review of the MIP images confirms the above findings
IMPRESSION: 1. Atrophy and chronic microvascular ischemic change. No acute
intracranial abnormality
2. No significant carotid or vertebral artery stenosis in the neck.
No significant intracranial stenosis. Negative for large vessel
occlusion
3. Advanced cervical spondylosis with multilevel spinal and
foraminal stenosis due to spurring.

## 2021-10-16 ENCOUNTER — Other Ambulatory Visit: Payer: Self-pay

## 2021-10-17 ENCOUNTER — Other Ambulatory Visit: Payer: Self-pay | Admitting: Internal Medicine

## 2021-10-19 DIAGNOSIS — G3184 Mild cognitive impairment, so stated: Secondary | ICD-10-CM | POA: Diagnosis not present

## 2021-10-19 DIAGNOSIS — Z8673 Personal history of transient ischemic attack (TIA), and cerebral infarction without residual deficits: Secondary | ICD-10-CM | POA: Diagnosis not present

## 2021-10-19 DIAGNOSIS — R1313 Dysphagia, pharyngeal phase: Secondary | ICD-10-CM | POA: Diagnosis not present

## 2021-10-19 DIAGNOSIS — R41841 Cognitive communication deficit: Secondary | ICD-10-CM | POA: Diagnosis not present

## 2021-10-22 ENCOUNTER — Emergency Department (HOSPITAL_COMMUNITY): Payer: Medicare Other

## 2021-10-22 ENCOUNTER — Encounter (HOSPITAL_COMMUNITY): Payer: Self-pay | Admitting: Emergency Medicine

## 2021-10-22 ENCOUNTER — Other Ambulatory Visit: Payer: Self-pay

## 2021-10-22 ENCOUNTER — Emergency Department (HOSPITAL_COMMUNITY)
Admission: EM | Admit: 2021-10-22 | Discharge: 2021-10-22 | Disposition: A | Discharge status: Home / Self Care | Payer: Medicare Other | Attending: Emergency Medicine | Admitting: Emergency Medicine

## 2021-10-22 DIAGNOSIS — I10 Essential (primary) hypertension: Secondary | ICD-10-CM | POA: Diagnosis not present

## 2021-10-22 DIAGNOSIS — R531 Weakness: Secondary | ICD-10-CM | POA: Diagnosis not present

## 2021-10-22 DIAGNOSIS — Z85828 Personal history of other malignant neoplasm of skin: Secondary | ICD-10-CM | POA: Diagnosis not present

## 2021-10-22 DIAGNOSIS — Z7901 Long term (current) use of anticoagulants: Secondary | ICD-10-CM | POA: Diagnosis not present

## 2021-10-22 DIAGNOSIS — R519 Headache, unspecified: Secondary | ICD-10-CM | POA: Diagnosis not present

## 2021-10-22 DIAGNOSIS — W19XXXA Unspecified fall, initial encounter: Secondary | ICD-10-CM | POA: Diagnosis not present

## 2021-10-22 DIAGNOSIS — Z85038 Personal history of other malignant neoplasm of large intestine: Secondary | ICD-10-CM | POA: Diagnosis not present

## 2021-10-22 DIAGNOSIS — Z79899 Other long term (current) drug therapy: Secondary | ICD-10-CM | POA: Diagnosis not present

## 2021-10-22 DIAGNOSIS — Z743 Need for continuous supervision: Secondary | ICD-10-CM | POA: Diagnosis not present

## 2021-10-22 DIAGNOSIS — S0990XA Unspecified injury of head, initial encounter: Secondary | ICD-10-CM | POA: Diagnosis not present

## 2021-10-22 DIAGNOSIS — M542 Cervicalgia: Secondary | ICD-10-CM | POA: Diagnosis not present

## 2021-10-22 DIAGNOSIS — S0003XA Contusion of scalp, initial encounter: Secondary | ICD-10-CM | POA: Insufficient documentation

## 2021-10-22 DIAGNOSIS — Y92129 Unspecified place in nursing home as the place of occurrence of the external cause: Secondary | ICD-10-CM | POA: Diagnosis not present

## 2021-10-22 DIAGNOSIS — M40202 Unspecified kyphosis, cervical region: Secondary | ICD-10-CM | POA: Diagnosis not present

## 2021-10-22 DIAGNOSIS — I4891 Unspecified atrial fibrillation: Secondary | ICD-10-CM | POA: Diagnosis not present

## 2021-10-22 DIAGNOSIS — R0902 Hypoxemia: Secondary | ICD-10-CM | POA: Diagnosis not present

## 2021-10-22 LAB — BASIC METABOLIC PANEL
Anion gap: 11 (ref 5–15)
BUN: 16 mg/dL (ref 8–23)
CO2: 23 mmol/L (ref 22–32)
Calcium: 9.3 mg/dL (ref 8.9–10.3)
Chloride: 96 mmol/L — ABNORMAL LOW (ref 98–111)
Creatinine, Ser: 0.74 mg/dL (ref 0.44–1.00)
GFR, Estimated: >60 mL/min (ref >60)
Glucose, Bld: 117 mg/dL — ABNORMAL HIGH (ref 70–99)
Potassium: 3.9 mmol/L (ref 3.5–5.1)
Sodium: 130 mmol/L — ABNORMAL LOW (ref 135–145)

## 2021-10-22 LAB — CBC WITH DIFFERENTIAL/PLATELET
Abs Immature Granulocytes: 0.08 10*3/uL — ABNORMAL HIGH (ref 0.00–0.07)
Basophils Absolute: 0.1 10*3/uL (ref 0.0–0.1)
Basophils Relative: 1 %
Eosinophils Absolute: 0.1 10*3/uL (ref 0.0–0.5)
Eosinophils Relative: 1 %
HCT: 33.3 % — ABNORMAL LOW (ref 36.0–46.0)
Hemoglobin: 11.3 g/dL — ABNORMAL LOW (ref 12.0–15.0)
Immature Granulocytes: 1 %
Lymphocytes Relative: 13 %
Lymphs Abs: 1.4 10*3/uL (ref 0.7–4.0)
MCH: 34 pg (ref 26.0–34.0)
MCHC: 33.9 g/dL (ref 30.0–36.0)
MCV: 100.3 fL — ABNORMAL HIGH (ref 80.0–100.0)
Monocytes Absolute: 1.4 10*3/uL — ABNORMAL HIGH (ref 0.1–1.0)
Monocytes Relative: 13 %
Neutro Abs: 7.9 10*3/uL — ABNORMAL HIGH (ref 1.7–7.7)
Neutrophils Relative %: 71 %
Platelets: 202 10*3/uL (ref 150–400)
RBC: 3.32 MIL/uL — ABNORMAL LOW (ref 3.87–5.11)
RDW: 16.3 % — ABNORMAL HIGH (ref 11.5–15.5)
WBC: 11 10*3/uL — ABNORMAL HIGH (ref 4.0–10.5)
nRBC: 0 % (ref 0.0–0.2)

## 2021-10-22 MED ORDER — LABETALOL HCL 5 MG/ML IV SOLN
10.0000 mg | Freq: Once | INTRAVENOUS | Status: AC
  Administered 2021-10-22: 10 mg via INTRAVENOUS
  Filled 2021-10-22: qty 4

## 2021-10-22 MED ORDER — IRBESARTAN 75 MG PO TABS
75.0000 mg | ORAL_TABLET | Freq: Every day | ORAL | Status: DC
  Administered 2021-10-22: 75 mg via ORAL
  Filled 2021-10-22: qty 1

## 2021-10-22 MED ORDER — FENTANYL CITRATE PF 50 MCG/ML IJ SOSY
50.0000 ug | PREFILLED_SYRINGE | Freq: Once | INTRAMUSCULAR | Status: AC
  Administered 2021-10-22: 50 ug via INTRAVENOUS

## 2021-10-22 MED ORDER — FENTANYL CITRATE (PF) 100 MCG/2ML IJ SOLN
INTRAMUSCULAR | Status: AC
  Filled 2021-10-22: qty 2

## 2021-10-22 MED ORDER — SODIUM CHLORIDE 0.9 % IV BOLUS
500.0000 mL | Freq: Once | INTRAVENOUS | Status: AC
  Administered 2021-10-22: 500 mL via INTRAVENOUS

## 2021-10-22 MED ORDER — METOPROLOL SUCCINATE ER 25 MG PO TB24
50.0000 mg | ORAL_TABLET | Freq: Once | ORAL | Status: AC
Start: 2021-10-22 — End: 2021-10-22
  Administered 2021-10-22: 50 mg via ORAL
  Filled 2021-10-22: qty 2

## 2021-10-22 MED ORDER — ACETAMINOPHEN 500 MG PO TABS
1000.0000 mg | ORAL_TABLET | Freq: Once | ORAL | Status: AC
  Administered 2021-10-22: 1000 mg via ORAL
  Filled 2021-10-22: qty 2

## 2021-10-22 NOTE — ED Notes (Signed)
Patient transported to CT with RN 

## 2021-10-22 NOTE — ED Notes (Signed)
[facility] called for patient 

## 2021-10-22 NOTE — Discharge Instructions (Addendum)
1.  Return immediately if there is any confusion or headache or other concerning changes. 2.  Your blood pressures were elevated in the emergency department.  You have been given your morning medications and an IV dose of blood pressure medication.  Continue your medications as prescribed.  Monitor your blood pressures 2-3 times a day, if your blood pressures remain persistently high, you may need adjustments to your doses.  Call your doctor Monday for continued monitoring of your blood pressure.

## 2021-10-22 NOTE — ED Provider Notes (Addendum)
[facility] EMERGENCY DEPARTMENT Provider Note  CSN: [id number] Arrival date & time: 10/22/21 0451  Chief Complaint(s) Fall on Eliquis  HPI Kathy Velez is a 88 y.o. female with a past medical history listed below including hypertension, hyperlipidemia, A-fib on Eliquis who presents from skilled nursing facility after mechanical fall resulting in head trauma.  Patient got up to go to the restroom and lost her balance using her walker causing her to fall backwards and hitting her head.  There is no loss of consciousness.  She is endorsing severe scalp pain.  No neck pain or back pain.  No chest pain or abdominal pain.  No extremity pain.  No other physical complaints  HPI  Past Medical History Past Medical History:  Diagnosis Date   Allergy    seasonal   Anemia    Anxiety    Arthritis    Back   Atrial fibrillation ([place])    Cancer ([place]) 2006   Colon.  Basal Cell Skin cancer- right arm   Cataract    removed bilateral   Colon cancer ([place])    Constipation due to pain medication therapy    after heart surgery   Dysrhythmia    PAF   GERD (gastroesophageal reflux disease)    Heart murmur    Hyperlipidemia    Hypertension    Hypothyroidism    RA (rheumatoid arthritis) ([place])    Restless leg    Seizures ([place])    after Heart Surgey   Stroke [facility])    TIA- found by neurologist after    Patient Active Problem List   Diagnosis Date Noted   Aortic atherosclerosis ([place]) 07/26/2021   Major neurocognitive disorder, due to vascular disease, without behavioral disturbance, mild ([place]) 04/25/2021   Fecal incontinence 04/25/2021   Colon cancer ([place]) 04/25/2021   Periorbital hematoma of right eye 09/06/2020   Closed fracture of nasal bones 09/06/2020   Closed fracture of orbit ([place]) 09/06/2020   Retrobulbar hematoma 09/06/2020   Periorbital hematoma 09/06/2020   Psychophysiological insomnia 10/11/2019   DNR (do not resuscitate) 10/11/2019   Transient ischemic attack  (TIA) 03/09/2019   Pain in right knee 02/12/2018   Osteopenia of forearm 12/11/2017   Primary osteoarthritis of both knees 12/11/2017   Permanent atrial fibrillation ([place]) 11/22/2017   Hypercoagulable state due to atrial fibrillation (HCC) 10/11/2017   Chronic atrial fibrillation ([place]) 10/11/2017   Diplopia 10/11/2017   Presbycusis of both ears 10/11/2017   Acquired hammertoes of both feet 10/11/2017   Macrocytosis without anemia 10/11/2017   Genu valgum, right 10/11/2017   History of multiple strokes 10/11/2017   Mild cognitive impairment with memory loss 10/11/2017   Overactive bladder 10/11/2017   Mixed hyperlipidemia 09/03/2017   History of colon cancer, stage III 01/17/2017   Carotid stenosis 12/13/2016   Chronic congestion of paranasal sinus 11/13/2016   Chronic seasonal allergic rhinitis 06/06/2016   Interstitial lung disease ([place]) 12/28/2015   Hemispheric carotid artery syndrome 07/18/2015   Hypothyroidism 05/05/2015   Rheumatoid arthritis ([place]) 05/05/2015   Chronic constipation 05/05/2015   Vitamin B12 deficiency 05/05/2015   Iron deficiency anemia 05/02/2015   Back pain 05/02/2015   Preop cardiovascular exam    Atrial flutter ([place]) 04/13/2015   Status post mitral valve repair 04/13/2015   Status post tricuspid valve repair 04/13/2015   Spondylolisthesis of lumbar region 04/12/2015   Spinal stenosis of lumbar region 02/02/2015   RLS (restless legs syndrome) 11/10/2013   HTN (hypertension), benign 07/14/2013  Depression 07/02/2011   Home Medication(s) Prior to Admission medications   Medication Sig Start Date End Date Taking? Authorizing Provider  acetaminophen (TYLENOL) 325 MG tablet Take 2 tablets (650 mg total) by mouth every 6 (six) hours as needed for mild pain or fever. 09/08/20   Little Ishikawa, MD  amoxicillin (AMOXIL) 500 MG capsule Take 500 mg by mouth. Before dental visits every 6 months Patient not taking: Reported on 05/16/2021    [provider]  apixaban (ELIQUIS) 2.5 MG TABS tablet Take 1 tablet (2.5 mg total) by mouth 2 (two) times daily. 01/02/21   Virgie Dad, MD  atorvastatin (LIPITOR) 20 MG tablet TAKE 1 TABLET DAILY 06/14/21   Virgie Dad, MD  Cholecalciferol (VITAMIN D) 50 MCG (2000 UT) CAPS Take 1 capsule by mouth daily.    [provider]  digoxin (LANOXIN) 0.125 MG tablet TAKE 1 TABLET DAILY 06/14/21   Virgie Dad, MD  ENBREL SURECLICK 50 MG/ML injection  03/06/18   [provider]  Iron, Ferrous Sulfate, 325 (65 Fe) MG TABS Take 325 mg by mouth daily. 01/03/21   Royal Hawthorn, NP  leflunomide (ARAVA) 20 MG tablet Take 20 mg by mouth daily. 10/05/19   [provider]  levothyroxine (SYNTHROID) 75 MCG tablet TAKE 1 TABLET DAILY 30 MINUTES BEFORE BREAKFAST ON AN EMPTY STOMACH 09/14/21   Cleophas Dunker, Amy E, NP  metoprolol succinate (TOPROL-XL) 50 MG 24 hr tablet TAKE 1 TABLET DAILY 06/14/21   Virgie Dad, MD  mirtazapine (REMERON) 15 MG tablet TAKE 1 TABLET AT BEDTIME 02/20/21   Virgie Dad, MD  MULTIPLE VITAMIN PO Take by mouth.    [provider]  Multiple Vitamins-Minerals (PRESERVISION AREDS 2) CAPS Take 2 Doses by mouth daily at 6 (six) AM. For eye health    [provider]  omeprazole (PRILOSEC) 40 MG capsule TAKE 1 CAPSULE DAILY 10/17/21   Virgie Dad, MD  prochlorperazine (COMPAZINE) 10 MG tablet Take 1 tablet (10 mg total) by mouth every 6 (six) hours as needed for nausea or vomiting. Patient not taking: Reported on 07/25/2021 05/25/21   Ladell Pier, MD  valsartan (DIOVAN) 80 MG tablet Take 1 tablet (80 mg total) by mouth daily. 01/02/21   Virgie Dad, MD  Vibegron (GEMTESA PO) Take 75 mg by mouth daily. Dose unknown    [provider]  vitamin B-12 (CYANOCOBALAMIN) 1000 MCG tablet Take 1,000 mcg by mouth daily.     [provider]                                                                                                                                     Allergies Codeine, Gabapentin, Molds & smuts, Pollen extract, and Bee venom  Review of Systems Review of Systems As noted in HPI  Physical Exam Vital Signs  I have reviewed the triage vital signs BP Marland Kitchen)  214/88   Pulse 66   Temp 97.6 F (36.4 C) (Oral)   Resp 12   Ht '5\' 2"'$  (1.575 m)   Wt 53.3 kg   SpO2 95%   BMI 21.49 kg/m   Physical Exam Constitutional:      General: She is not in acute distress.    Appearance: She is well-developed. She is not diaphoretic.  HENT:     Head: Normocephalic. Contusion present.      Right Ear: External ear normal.     Left Ear: External ear normal.     Nose: Nose normal.  Eyes:     General: No scleral icterus.       Right eye: No discharge.        Left eye: No discharge.     Conjunctiva/sclera: Conjunctivae normal.     Pupils: Pupils are equal, round, and reactive to light.  Cardiovascular:     Rate and Rhythm: Normal rate and regular rhythm.     Pulses:          Radial pulses are 2+ on the right side and 2+ on the left side.       Dorsalis pedis pulses are 2+ on the right side and 2+ on the left side.     Heart sounds: Normal heart sounds. No murmur heard.    No friction rub. No gallop.  Pulmonary:     Effort: Pulmonary effort is normal. No respiratory distress.     Breath sounds: Normal breath sounds. No stridor. No wheezing.  Abdominal:     General: There is no distension.     Palpations: Abdomen is soft.     Tenderness: There is no abdominal tenderness.  Musculoskeletal:        General: No tenderness.     Cervical back: Normal range of motion and neck supple. No bony tenderness.     Thoracic back: No bony tenderness.     Lumbar back: No bony tenderness.     Comments: Clavicles stable. Chest stable to AP/Lat compression. Pelvis stable to Lat compression. No obvious extremity deformity. No chest or abdominal wall contusion.  Skin:    General: Skin is warm and dry.     Findings: No  erythema or rash.  Neurological:     Mental Status: She is alert and oriented to person, place, and time.     Comments: Moving all extremities     ED Results and Treatments Labs (all labs ordered are listed, but only abnormal results are displayed) Labs Reviewed  CBC WITH DIFFERENTIAL/PLATELET - Abnormal; Notable for the following components:      Result Value   WBC 11.0 (*)    RBC 3.32 (*)    Hemoglobin 11.3 (*)    HCT 33.3 (*)    MCV 100.3 (*)    RDW 16.3 (*)    Neutro Abs 7.9 (*)    Monocytes Absolute 1.4 (*)    Abs Immature Granulocytes 0.08 (*)    All other components within normal limits  BASIC METABOLIC PANEL - Abnormal; Notable for the following components:   Sodium 130 (*)    Chloride 96 (*)    Glucose, Bld 117 (*)    All other components within normal limits  EKG  EKG Interpretation  Date/Time:  Sunday October 22 2021 05:01:45 EDT Ventricular Rate:  93 PR Interval:    QRS Duration: 92 QT Interval:  348 QTC Calculation: 433 R Axis:   0 Text Interpretation: Atrial fibrillation Probable left ventricular hypertrophy Anterior Q waves, possibly due to LVH Nonspecific T abnormalities, lateral leads Confirmed by Addison Lank [phone]) on 10/22/2021 7:48:32 AM       Radiology CT Head Wo Contrast  Result Date: 10/22/2021 CLINICAL DATA:  Fall injury on blood thinners with right occipital head injury, neck pain. EXAM: CT HEAD WITHOUT CONTRAST CT CERVICAL SPINE WITHOUT CONTRAST TECHNIQUE: Multidetector CT imaging of the head and cervical spine was performed following the standard protocol without intravenous contrast. Multiplanar CT image reconstructions of the cervical spine were also generated. RADIATION DOSE REDUCTION: This exam was performed according to the departmental dose-optimization program which includes automated exposure control, adjustment of the  mA and/or kV according to patient size and/or use of iterative reconstruction technique. COMPARISON:  Head CT and cervical spine CT both 09/06/2020. FINDINGS: CT HEAD FINDINGS Brain: There is moderately advanced cerebral atrophy, small-vessel disease and atrophic ventriculomegaly. Mild cerebellar atrophy. No acute infarct, hemorrhage or mass are seen. There is no midline shift. The basal cisterns are clear. Vascular: There are calcifications in the carotid siphons and distal left vertebral artery. There are no hyperdense central vessels. Skull: There is a large right parietal convexity scalp hematoma. The calvarium and skull base are intact. There is a chronic right orbital floor fracture which was acute on the previous exam. Sinuses/Orbits: Old lens replacements.  No acute abnormality. Other: None. CT CERVICAL SPINE FINDINGS Alignment: Chronic upper cervical kyphosis is noted with grade 1 anterolisthesis at C2-3 and C3-4, trace anterolisthesis C6-7 and C7-T1 and grade 1 retrolisthesis at C5-6, all unchanged and all consistent with degenerative listhesis. There is no new or traumatic alignment abnormality. Anterior atlantodental bone-on-bone joint space loss is again noted with osteophytes at the articular margins. Skull base and vertebrae: Osteopenia. No fracture is seen or primary bone lesion. Right anterior bulky bridging osteophytes are again shown C4-5 and C5-6. Soft tissues and spinal canal: No prevertebral fluid or swelling. No visible canal hematoma. There is moderate calcification in the proximal cervical ICAs. Small atrophic thyroid gland. Disc levels: Facet joint ankylosis again noted on the right at C3-4. There is chronic disc collapse C4-5, C5-6, C6-7 and moderate disc space loss C3-4. There are bidirectional osteophytes from C3-4 through C6-7 with variable thecal sac encroachment, greatest at C4-5 and C5-6 where there is spondylotic cord compression and narrowing of the AP thecal sac diameter to as  little as 5 mm. Facet joint and uncinate hypertrophy again results in multilevel foraminal stenosis worst on the right at C4-5 and C5-6. Upper chest: There is biapical pleural-parenchymal scarring and scattered calcifications. Other: None IMPRESSION: 1. Large right occipital convexity scalp hematoma. No acute intracranial CT findings or depressed skull fractures. 2. Chronic right orbital floor fracture. 3. Osteopenia and advanced degenerative changes of the cervical spine without evidence of fractures. See above for details. 4. No new cervical alignment abnormality with multilevel degenerative listhesis and prominent upper cervical kyphosis. 5. Carotid atherosclerosis. Electronically Signed   By: Telford Nab M.D.   On: 10/22/2021 06:35   CT Cervical Spine Wo Contrast  Result Date: 10/22/2021 CLINICAL DATA:  Fall injury on blood thinners with right occipital head injury, neck pain. EXAM: CT HEAD WITHOUT CONTRAST CT CERVICAL SPINE WITHOUT CONTRAST TECHNIQUE: Multidetector CT imaging  of the head and cervical spine was performed following the standard protocol without intravenous contrast. Multiplanar CT image reconstructions of the cervical spine were also generated. RADIATION DOSE REDUCTION: This exam was performed according to the departmental dose-optimization program which includes automated exposure control, adjustment of the mA and/or kV according to patient size and/or use of iterative reconstruction technique. COMPARISON:  Head CT and cervical spine CT both 09/06/2020. FINDINGS: CT HEAD FINDINGS Brain: There is moderately advanced cerebral atrophy, small-vessel disease and atrophic ventriculomegaly. Mild cerebellar atrophy. No acute infarct, hemorrhage or mass are seen. There is no midline shift. The basal cisterns are clear. Vascular: There are calcifications in the carotid siphons and distal left vertebral artery. There are no hyperdense central vessels. Skull: There is a large right parietal convexity  scalp hematoma. The calvarium and skull base are intact. There is a chronic right orbital floor fracture which was acute on the previous exam. Sinuses/Orbits: Old lens replacements.  No acute abnormality. Other: None. CT CERVICAL SPINE FINDINGS Alignment: Chronic upper cervical kyphosis is noted with grade 1 anterolisthesis at C2-3 and C3-4, trace anterolisthesis C6-7 and C7-T1 and grade 1 retrolisthesis at C5-6, all unchanged and all consistent with degenerative listhesis. There is no new or traumatic alignment abnormality. Anterior atlantodental bone-on-bone joint space loss is again noted with osteophytes at the articular margins. Skull base and vertebrae: Osteopenia. No fracture is seen or primary bone lesion. Right anterior bulky bridging osteophytes are again shown C4-5 and C5-6. Soft tissues and spinal canal: No prevertebral fluid or swelling. No visible canal hematoma. There is moderate calcification in the proximal cervical ICAs. Small atrophic thyroid gland. Disc levels: Facet joint ankylosis again noted on the right at C3-4. There is chronic disc collapse C4-5, C5-6, C6-7 and moderate disc space loss C3-4. There are bidirectional osteophytes from C3-4 through C6-7 with variable thecal sac encroachment, greatest at C4-5 and C5-6 where there is spondylotic cord compression and narrowing of the AP thecal sac diameter to as little as 5 mm. Facet joint and uncinate hypertrophy again results in multilevel foraminal stenosis worst on the right at C4-5 and C5-6. Upper chest: There is biapical pleural-parenchymal scarring and scattered calcifications. Other: None IMPRESSION: 1. Large right occipital convexity scalp hematoma. No acute intracranial CT findings or depressed skull fractures. 2. Chronic right orbital floor fracture. 3. Osteopenia and advanced degenerative changes of the cervical spine without evidence of fractures. See above for details. 4. No new cervical alignment abnormality with multilevel  degenerative listhesis and prominent upper cervical kyphosis. 5. Carotid atherosclerosis. Electronically Signed   By: Telford Nab M.D.   On: 10/22/2021 06:35    Pertinent labs & imaging results that were available during my care of the patient were reviewed by me and considered in my medical decision making (see MDM for details).  Medications Ordered in ED Medications  fentaNYL (SUBLIMAZE) 100 MCG/2ML injection (  Not Given 10/22/21 0554)  irbesartan (AVAPRO) tablet 75 mg (has no administration in time range)  acetaminophen (TYLENOL) tablet 1,000 mg (has no administration in time range)  labetalol (NORMODYNE) injection 10 mg (has no administration in time range)  fentaNYL (SUBLIMAZE) injection 50 mcg (50 mcg Intravenous Given 10/22/21 0515)  Procedures Procedures  (including critical care time)  Medical Decision Making / ED Course    Complexity of Problem:  Co-morbidities/SDOH that complicate the patient evaluation/care: Noted above in HPI  Additional history obtained: EMS  Patient's presenting problem/concern, DDX, and MDM listed below: Mechanical fall with head trauma on thinners ABCs intact Noted to be hypertensive with systolics in the 846K Secondary as above We will obtain screening labs, CT head and cervical spine to rule out any acute injuries  Hospitalization Considered:  Yes  Initial Intervention:  IV pain medicine    Complexity of Data:   Cardiac Monitoring: The patient was maintained on a cardiac monitor.   I personally viewed and interpreted the cardiac monitored which showed an underlying rhythm of atrial fibrillation with rates in the 90s EKG confirming A-fib in the 90s without acute ischemic changes  Laboratory Tests ordered listed below with my independent interpretation: CBC with mild leukocytosis.  Stable  hemoglobin. Hyponatremia is at baseline.  No other significant electrolyte derangements or renal sufficiency   Imaging Studies ordered listed below with my independent interpretation: CT head without evidence of ICH.  Notable scalp hematoma.  CT cervical spine without acute fracture or dislocation.     ED Course:    Assessment, Add'l Intervention, and Reassessment: Mechanical fall with head trauma on blood thinners Work-up is reassuring without any serious injuries Patient is hypertensive likely related to pain Provided with IV pain medicine, and blood pressure medicine. Plan to monitor for 3-4 hours and reasses. Patient care turned over to oncoming provider. Patient case and results discussed in detail; please see their note for further ED managment.    Final Clinical Impression(s) / ED Diagnoses Final diagnoses:  Traumatic injury of head, initial encounter  Hematoma of scalp, initial encounter  Fall at nursing home, initial encounter           This chart was dictated using voice recognition software.  Despite best efforts to proofread,  errors can occur which can change the documentation meaning.      Fatima Blank, MD 10/22/21 [phone]

## 2021-10-22 NOTE — ED Provider Notes (Addendum)
Manage BP, if improves D/C  Physical Exam  BP (!) 214/88   Pulse 66   Temp 97.6 F (36.4 C) (Oral)   Resp 12   Ht '5\' 2"'$  (1.575 m)   Wt 53.3 kg   SpO2 95%   BMI 21.49 kg/m   Physical Exam  Procedures  Procedures  ED Course / MDM    Medical Decision Making Amount and/or Complexity of Data Reviewed Labs: ordered. Radiology: ordered.  Risk OTC drugs. Prescription drug management.   08 45: I have rechecked the patient.  She is alert.  She is interactive and pleasant.  No signs of distress.  Reports she still has pain at the back of her head where she has a hematoma.  No generalized headache.  Patient speech is clear and she has recall for the event.  Blood pressures have been trending down, last blood pressure 176/101.  She reports that she had not realize she had run out of her valsartan yesterday.  Patient has been given oral dose of Avapro equivalent for valsartan.  She is also given 10 mg of labetalol.  At this time we will also administer her oral metoprolol for a.m. dosing.   08: 57 I left message on daughter's contact number for update.  Patient daughter called back.  I have updated her on diagnostic evaluation work-up and management.  She reports she has good supervision at her assisted living.  They will continue to monitor her medications and frequent checks.  Patient has remained alert and oriented.  She is well in appearance.  Blood pressures have been trending down.  Predominantly now down to 160s.  One reading of 137/80.  Stable for discharge.       Kathy Shanks, MD 10/22/21 2778    Kathy Shanks, MD 10/22/21 [phone]

## 2021-10-22 NOTE — Progress Notes (Signed)
   10/22/21 0450  Clinical Encounter Type  Visited With Patient  Visit Type Trauma;ED   [facility] responded to Level II trauma page in ED; pt. arrived via EMS and after medical team completed initial assessments/treatments [place] introduced himself.  Pt. lives at [facility] and suffered a fall tonight when she got up to use the restroom; pt. has a daughter in [place] who is currently at a family cabin in [place] with her husband for the weekend; ALF called dtr. when fall occurred.  Pt. shared that her husband was a Psychologist, sport and exercise in [employer] and then became a IT sales professional at [employer].  Pt. is grateful for the medical team's assessment and that they are "checking me out."  No other immediate needs expressed.

## 2021-10-22 NOTE — ED Notes (Signed)
Pt needs to urinate, purewick placed

## 2021-10-22 NOTE — ED Triage Notes (Signed)
Pt in from [facility] as Level 2 mechanical fall on Eliquis. Pt states around 0330 this am, she was using her walker to go to the restroom, tripped and fell in the process, falling and hitting her head. Pt denies LOC, states staff helped her to a chair by the time EMS arrived. Pt with large R occipital hematoma on arrival, hypertensive en route - 220/116, PERRLA and no neuro deficits en route. GCS of 14 on arrival

## 2021-10-24 ENCOUNTER — Other Ambulatory Visit: Payer: Self-pay

## 2021-10-26 ENCOUNTER — Non-Acute Institutional Stay: Payer: Medicare Other | Admitting: Adult Health

## 2021-10-26 ENCOUNTER — Encounter: Payer: Self-pay | Admitting: Adult Health

## 2021-10-26 ENCOUNTER — Telehealth: Payer: Self-pay | Admitting: Nurse Practitioner

## 2021-10-26 DIAGNOSIS — F01A0 Vascular dementia, mild, without behavioral disturbance, psychotic disturbance, mood disturbance, and anxiety: Secondary | ICD-10-CM

## 2021-10-26 DIAGNOSIS — I951 Orthostatic hypotension: Secondary | ICD-10-CM | POA: Diagnosis not present

## 2021-10-26 DIAGNOSIS — C185 Malignant neoplasm of splenic flexure: Secondary | ICD-10-CM

## 2021-10-26 DIAGNOSIS — I4821 Permanent atrial fibrillation: Secondary | ICD-10-CM | POA: Diagnosis not present

## 2021-10-26 DIAGNOSIS — S0003XD Contusion of scalp, subsequent encounter: Secondary | ICD-10-CM

## 2021-10-26 LAB — BASIC METABOLIC PANEL
BUN: 16 (ref 4–21)
CO2: 28 — AB (ref 13–22)
Chloride: 92 — AB (ref 99–108)
Creatinine: 0.7 (ref 0.5–1.1)
Glucose: 93
Potassium: 4.2 mEq/L (ref 3.5–5.1)
Sodium: 127 — AB (ref 137–147)

## 2021-10-26 LAB — CBC AND DIFFERENTIAL
HCT: 33 — AB (ref 36–46)
Hemoglobin: 11.2 — AB (ref 12.0–16.0)
Platelets: 190 10*3/uL (ref 150–400)
WBC: 8.8

## 2021-10-26 LAB — COMPREHENSIVE METABOLIC PANEL: Calcium: 8.9 (ref 8.7–10.7)

## 2021-10-26 LAB — CBC: RBC: 3.32 — AB (ref 3.87–5.11)

## 2021-10-26 MED ORDER — VALSARTAN 80 MG PO TABS: 40.0000 mg | ORAL_TABLET | Freq: Every day | ORAL | 0 refills | Status: DC

## 2021-10-26 NOTE — Progress Notes (Signed)
Location:  [facility] Room Number: AL 524A Place of Service:  ALF [phone]) Provider:  Royal Hawthorn, NP  Virgie Dad, MD  Patient Care Team: Virgie Dad, MD as PCP - General (Internal Medicine) Pixie Casino, MD as PCP - Cardiology (Cardiology) Pieter Partridge, DO as Consulting Physician (Neurology)  Extended Emergency Contact Information Primary Emergency Contact: [name],[name] Address: [address]          [place], [place] [address] Johnnette Litter of [place] Phone: [phone] Mobile Phone: [phone] Relation: Daughter Secondary Emergency Contact: Eula Listen Address: [address]          [place], [place] [address] Johnnette Litter of [place] Mobile Phone: [phone] Relation: Son  Code Status:  DNR Goals of care: Advanced Directive information    10/22/2021    5:07 AM  Advanced Directives  Does Patient Have a Medical Advance Directive? Yes  Type of Advance Directive Out of facility DNR (pink MOST or yellow form)  Pre-existing out of facility DNR order (yellow form or pink MOST form) Pink MOST/Yellow Form most recent copy in chart - Physician notified to receive inpatient order     Chief Complaint  Patient presents with   Acute Visit    Falls    HPI:  Pt is a 88 y.o. female seen today for an acute visit for falls. Ms. Witting recently moved to assisted living due to increased care needs and progressive memory loss.  Of note she has a hx of colon ca in 2006 with recurrence. Noted by colonoscopy 02/22/21, bx showed well differentiated adenocarcinoma. CT in Jan of 2023 showed no residual disease. Had 4 cycles of pembrolizumab. Colonoscopy 09/20/21 showed no residual mass. She is holding off on further treatment due to side effects.  She has multiple health problems and complex care needs. She has been feeling dizzy upon standing and having falls. She had what was described as a mechanical fall on 10/22/21 and hit her head. There  was no LOC per the notes. She has a large hematoma to the scalp. CT of the head 10/22/21 showed no acute intracranial abnormality other than the aforementioned scalp hematoma.  BMP 7/30 NA 130 BUN 16 Cr 0.74 CBC WBC 11 Hgb 11.3 BP was high in the ED. Treated with valsartan and labetalol. Since improved.   The nurse did orthostatic bps this morning due to reports of dizziness when standing.    lying 160/74 84 sitting 128/62 68 standing 118/64 62   She denies feeling dizzy now. Neuro checks have been WNL. She is able to walk with her walker. Nursing does report some increased confusion from baseline.   Past Medical History:  Diagnosis Date   Allergy    seasonal   Anemia    Anxiety    Arthritis    Back   Atrial fibrillation ([facility])    Cancer ([facility]) 2006   Colon.  Basal Cell Skin cancer- right arm   Cataract    removed bilateral   Colon cancer (HCC)    Constipation due to pain medication therapy    after heart surgery   Dysrhythmia    PAF   GERD (gastroesophageal reflux disease)    Heart murmur    Hyperlipidemia    Hypertension    Hypothyroidism    RA (rheumatoid arthritis) (HCC)    Restless leg    Seizures ([facility])    after Heart Surgey   Stroke ([facility])    TIA- found by neurologist after  Past Surgical History:  Procedure Laterality Date   ABDOMINAL HYSTERECTOMY  1970   Partial    COLON RESECTION  2006   cancer   COLON SURGERY     COLONOSCOPY     EYE SURGERY Bilateral    Cataract   MAXIMUM ACCESS (MAS)POSTERIOR LUMBAR INTERBODY FUSION (PLIF) 2 LEVEL N/A 04/12/2015   Procedure: Lumbar Three-Five Decompression, Pedicle Screw Fixation, and Posteriolateral Arthrodesis;  Surgeon: Erline Levine, MD;  Location: [facility];  Service: Neurosurgery;  Laterality: N/A;  L3-4 L4-5 Maximum access posterior lumbar fusion, possible interbodies and resection of synovial cyst at L4-5   MITRAL VALVE REPAIR  01/20/2013   Gore-tex cords to P1, P2, and P3. Magic suture to posterior medial  commisure, #30 Physio 1 ring. Done in [place]   TONSILLECTOMY     about [place]  01/20/2013   #28 TriAd ring done in [place]    Allergies  Allergen Reactions   Codeine Other (See Comments)    "just don't take it well"   Gabapentin     dizziness   Molds & Smuts    Pollen Extract Swelling   Bee Venom Swelling and Rash    Outpatient Encounter Medications as of 10/26/2021  Medication Sig   acetaminophen (TYLENOL) 325 MG tablet Take 2 tablets (650 mg total) by mouth every 6 (six) hours as needed for mild pain or fever. (Patient taking differently: Take 650 mg by mouth every 4 (four) hours as needed for mild pain or fever.)   amoxicillin (AMOXIL) 500 MG capsule Take 500 mg by mouth. Before dental visits every 6 months   apixaban (ELIQUIS) 2.5 MG TABS tablet Take 1 tablet (2.5 mg total) by mouth 2 (two) times daily.   atorvastatin (LIPITOR) 20 MG tablet TAKE 1 TABLET DAILY   Cholecalciferol (VITAMIN D) 50 MCG (2000 UT) CAPS Take 1 capsule by mouth daily.   digoxin (LANOXIN) 0.125 MG tablet TAKE 1 TABLET DAILY   hydrocortisone cream 1 % Apply 1 Application topically daily.   Iron, Ferrous Sulfate, 325 (65 Fe) MG TABS Take 325 mg by mouth daily. (Patient taking differently: Take 325 mg by mouth 2 (two) times daily.)   leflunomide (ARAVA) 20 MG tablet Take 20 mg by mouth daily.   levothyroxine (SYNTHROID) 75 MCG tablet TAKE 1 TABLET DAILY 30 MINUTES BEFORE BREAKFAST ON AN EMPTY STOMACH   metoprolol succinate (TOPROL-XL) 50 MG 24 hr tablet TAKE 1 TABLET DAILY   mirtazapine (REMERON) 15 MG tablet TAKE 1 TABLET AT BEDTIME   MULTIPLE VITAMIN PO Take by mouth.   Multiple Vitamins-Minerals (PRESERVISION AREDS 2) CAPS Take 1 Dose by mouth 2 (two) times daily. For eye health   omeprazole (PRILOSEC) 40 MG capsule TAKE 1 CAPSULE DAILY   valsartan (DIOVAN) 80 MG tablet Take 1 tablet (80 mg total) by mouth daily.   Vibegron (GEMTESA PO) Take 75 mg by mouth daily. Dose unknown    vitamin B-12 (CYANOCOBALAMIN) 1000 MCG tablet Take 1,000 mcg by mouth daily.    ENBREL SURECLICK 50 MG/ML injection  (Patient not taking: Reported on 06/13/2021)   prochlorperazine (COMPAZINE) 10 MG tablet Take 1 tablet (10 mg total) by mouth every 6 (six) hours as needed for nausea or vomiting. (Patient not taking: Reported on 07/25/2021)   No facility-administered encounter medications on file as of 10/26/2021.    Review of Systems  Constitutional:  Negative for activity change, appetite change, chills, diaphoresis, fatigue, fever and unexpected weight change.  HENT:  Negative for congestion.   Respiratory:  Negative for cough, shortness of breath and wheezing.   Cardiovascular:  Negative for chest pain, palpitations and leg swelling.  Gastrointestinal:  Negative for abdominal distention, abdominal pain, constipation and diarrhea.  Genitourinary:  Negative for difficulty urinating and dysuria.  Musculoskeletal:  Positive for arthralgias and gait problem. Negative for back pain, joint swelling and myalgias.  Neurological:  Positive for dizziness. Negative for tremors, seizures, syncope, facial asymmetry, speech difficulty, weakness, light-headedness, numbness and headaches.  Psychiatric/Behavioral:  Positive for confusion. Negative for agitation and behavioral problems.     Immunization History  Administered Date(s) Administered   Fluad Quad(high Dose 65+) 01/05/2019   H1N1 04/07/2008   Influenza Whole 12/29/2008, 12/22/2009   Influenza, High Dose Seasonal PF 01/15/2012, 12/23/2014, 01/22/2020   Influenza,inj,Quad PF,6+ Mos 01/22/2014, 12/10/2016, 01/17/2018   Influenza,trivalent, recombinat, inj, PF 01/05/2016   Influenza-Unspecified 04/07/2008, 12/15/2014, 01/13/2021   Moderna Sars-Covid-2 Vaccination 04/07/2019, 05/05/2019, 02/09/2020   PFIZER(Purple Top)SARS-COV-2 Vaccination 01/06/2021   PPD Test 07/17/2010, 04/19/2015, 04/25/2015   Pneumococcal Conjugate-13 11/10/2013   Pneumococcal  Polysaccharide-23 12/27/2010, 12/05/2016   Tdap 04/15/2017   Zoster Recombinat (Shingrix) 04/09/2017, 06/18/2017   Pertinent  Health Maintenance Due  Topic Date Due   INFLUENZA VACCINE  10/24/2021   DEXA SCAN  Completed      07/04/2021   10:50 AM 07/25/2021   10:06 AM 08/02/2021    8:33 AM 08/04/2021   11:33 AM 10/22/2021    5:08 AM  Fall Risk  Falls in the past year?   0 0   Was there an injury with Fall?   0 0   Fall Risk Category Calculator   0 0   Fall Risk Category   Low Low   Patient Fall Risk Level High fall risk High fall risk Low fall risk Low fall risk High fall risk  Patient at Risk for Falls Due to   Impaired balance/gait Impaired balance/gait   Fall risk Follow up   Falls evaluation completed Falls evaluation completed    Functional Status Survey:    Vitals:   10/26/21 1316  BP: (!) 169/74  Pulse: 63  Resp: 17  Temp: (!) 97.3 F (36.3 C)  TempSrc: Skin  SpO2: 96%  Weight: 115 lb 6.4 oz (52.3 kg)  Height: '5\' 2"'$  (1.575 m)   Body mass index is 21.11 kg/m. Physical Exam Vitals and nursing note reviewed.  Constitutional:      General: She is not in acute distress.    Appearance: She is not diaphoretic.  HENT:     Head:     Comments: Posterior scalp hematoma with ecchymosis surrounding and spreading down her neck due to gravity     Nose: Nose normal.     Mouth/Throat:     Mouth: Mucous membranes are moist.     Pharynx: Oropharynx is clear.  Eyes:     General:        Right eye: No discharge.        Left eye: No discharge.     Extraocular Movements: Extraocular movements intact.     Conjunctiva/sclera: Conjunctivae normal.     Pupils: Pupils are equal, round, and reactive to light.  Neck:     Vascular: No JVD.  Cardiovascular:     Rate and Rhythm: Normal rate. Rhythm irregular.     Heart sounds: No murmur heard. Pulmonary:     Effort: Pulmonary effort is normal. No respiratory distress.     Breath sounds: Normal breath  sounds. No wheezing.   Abdominal:     General: Bowel sounds are normal. There is no distension.     Palpations: Abdomen is soft. There is no mass.     Tenderness: There is no abdominal tenderness. There is no right CVA tenderness or left CVA tenderness.  Musculoskeletal:     Right lower leg: No edema.     Left lower leg: No edema.  Skin:    General: Skin is warm and dry.  Neurological:     General: No focal deficit present.     Mental Status: She is alert. Mental status is at baseline.     Cranial Nerves: No cranial nerve deficit.     Comments: Walking with walker. Alert. Oriented to self and place not time. Forgetful of the details of her care.   Psychiatric:        Mood and Affect: Mood normal.     Labs reviewed: Recent Labs    08/22/21 1134 10/03/21 1055 10/22/21 0501  NA 133* 131* 130*  K 4.2 4.6 3.9  CL 99 99 96*  CO2 24 21* 23  GLUCOSE 98 113* 117*  BUN '22 18 16  '$ CREATININE 0.86 0.79 0.74  CALCIUM 9.9 9.9 9.3   Recent Labs    07/25/21 0945 08/22/21 1134 10/03/21 1055  AST '20 22 22  '$ ALT '18 20 18  '$ ALKPHOS 86 84 75  BILITOT 0.6 0.6 0.8  PROT 6.8 7.5 7.1  ALBUMIN 4.3 4.5 4.5   Recent Labs    08/22/21 1134 10/03/21 1055 10/22/21 0501  WBC 9.4 8.0 11.0*  NEUTROABS 6.5 5.4 7.9*  HGB 12.4 11.3* 11.3*  HCT 37.6 32.8* 33.3*  MCV 100.8* 97.0 100.3*  PLT 203 220 202   Lab Results  Component Value Date   TSH 7.923 (H) 10/03/2021   Lab Results  Component Value Date   HGBA1C 5.5 01/12/2013   Lab Results  Component Value Date   CHOL 115 02/02/2021   HDL 48 02/02/2021   LDLCALC 49 02/02/2021   TRIG 91 02/02/2021    Significant Diagnostic Results in last 30 days:  CT Head Wo Contrast  Result Date: 10/22/2021 CLINICAL DATA:  Fall injury on blood thinners with right occipital head injury, neck pain. EXAM: CT HEAD WITHOUT CONTRAST CT CERVICAL SPINE WITHOUT CONTRAST TECHNIQUE: Multidetector CT imaging of the head and cervical spine was performed following the standard protocol  without intravenous contrast. Multiplanar CT image reconstructions of the cervical spine were also generated. RADIATION DOSE REDUCTION: This exam was performed according to the departmental dose-optimization program which includes automated exposure control, adjustment of the mA and/or kV according to patient size and/or use of iterative reconstruction technique. COMPARISON:  Head CT and cervical spine CT both 09/06/2020. FINDINGS: CT HEAD FINDINGS Brain: There is moderately advanced cerebral atrophy, small-vessel disease and atrophic ventriculomegaly. Mild cerebellar atrophy. No acute infarct, hemorrhage or mass are seen. There is no midline shift. The basal cisterns are clear. Vascular: There are calcifications in the carotid siphons and distal left vertebral artery. There are no hyperdense central vessels. Skull: There is a large right parietal convexity scalp hematoma. The calvarium and skull base are intact. There is a chronic right orbital floor fracture which was acute on the previous exam. Sinuses/Orbits: Old lens replacements.  No acute abnormality. Other: None. CT CERVICAL SPINE FINDINGS Alignment: Chronic upper cervical kyphosis is noted with grade 1 anterolisthesis at C2-3 and C3-4, trace anterolisthesis C6-7 and C7-T1 and grade 1 retrolisthesis at C5-6,  all unchanged and all consistent with degenerative listhesis. There is no new or traumatic alignment abnormality. Anterior atlantodental bone-on-bone joint space loss is again noted with osteophytes at the articular margins. Skull base and vertebrae: Osteopenia. No fracture is seen or primary bone lesion. Right anterior bulky bridging osteophytes are again shown C4-5 and C5-6. Soft tissues and spinal canal: No prevertebral fluid or swelling. No visible canal hematoma. There is moderate calcification in the proximal cervical ICAs. Small atrophic thyroid gland. Disc levels: Facet joint ankylosis again noted on the right at C3-4. There is chronic disc  collapse C4-5, C5-6, C6-7 and moderate disc space loss C3-4. There are bidirectional osteophytes from C3-4 through C6-7 with variable thecal sac encroachment, greatest at C4-5 and C5-6 where there is spondylotic cord compression and narrowing of the AP thecal sac diameter to as little as 5 mm. Facet joint and uncinate hypertrophy again results in multilevel foraminal stenosis worst on the right at C4-5 and C5-6. Upper chest: There is biapical pleural-parenchymal scarring and scattered calcifications. Other: None IMPRESSION: 1. Large right occipital convexity scalp hematoma. No acute intracranial CT findings or depressed skull fractures. 2. Chronic right orbital floor fracture. 3. Osteopenia and advanced degenerative changes of the cervical spine without evidence of fractures. See above for details. 4. No new cervical alignment abnormality with multilevel degenerative listhesis and prominent upper cervical kyphosis. 5. Carotid atherosclerosis. Electronically Signed   By: Telford Nab M.D.   On: 10/22/2021 06:35   CT Cervical Spine Wo Contrast  Result Date: 10/22/2021 CLINICAL DATA:  Fall injury on blood thinners with right occipital head injury, neck pain. EXAM: CT HEAD WITHOUT CONTRAST CT CERVICAL SPINE WITHOUT CONTRAST TECHNIQUE: Multidetector CT imaging of the head and cervical spine was performed following the standard protocol without intravenous contrast. Multiplanar CT image reconstructions of the cervical spine were also generated. RADIATION DOSE REDUCTION: This exam was performed according to the departmental dose-optimization program which includes automated exposure control, adjustment of the mA and/or kV according to patient size and/or use of iterative reconstruction technique. COMPARISON:  Head CT and cervical spine CT both 09/06/2020. FINDINGS: CT HEAD FINDINGS Brain: There is moderately advanced cerebral atrophy, small-vessel disease and atrophic ventriculomegaly. Mild cerebellar atrophy. No  acute infarct, hemorrhage or mass are seen. There is no midline shift. The basal cisterns are clear. Vascular: There are calcifications in the carotid siphons and distal left vertebral artery. There are no hyperdense central vessels. Skull: There is a large right parietal convexity scalp hematoma. The calvarium and skull base are intact. There is a chronic right orbital floor fracture which was acute on the previous exam. Sinuses/Orbits: Old lens replacements.  No acute abnormality. Other: None. CT CERVICAL SPINE FINDINGS Alignment: Chronic upper cervical kyphosis is noted with grade 1 anterolisthesis at C2-3 and C3-4, trace anterolisthesis C6-7 and C7-T1 and grade 1 retrolisthesis at C5-6, all unchanged and all consistent with degenerative listhesis. There is no new or traumatic alignment abnormality. Anterior atlantodental bone-on-bone joint space loss is again noted with osteophytes at the articular margins. Skull base and vertebrae: Osteopenia. No fracture is seen or primary bone lesion. Right anterior bulky bridging osteophytes are again shown C4-5 and C5-6. Soft tissues and spinal canal: No prevertebral fluid or swelling. No visible canal hematoma. There is moderate calcification in the proximal cervical ICAs. Small atrophic thyroid gland. Disc levels: Facet joint ankylosis again noted on the right at C3-4. There is chronic disc collapse C4-5, C5-6, C6-7 and moderate disc space loss C3-4. There are  bidirectional osteophytes from C3-4 through C6-7 with variable thecal sac encroachment, greatest at C4-5 and C5-6 where there is spondylotic cord compression and narrowing of the AP thecal sac diameter to as little as 5 mm. Facet joint and uncinate hypertrophy again results in multilevel foraminal stenosis worst on the right at C4-5 and C5-6. Upper chest: There is biapical pleural-parenchymal scarring and scattered calcifications. Other: None IMPRESSION: 1. Large right occipital convexity scalp hematoma. No acute  intracranial CT findings or depressed skull fractures. 2. Chronic right orbital floor fracture. 3. Osteopenia and advanced degenerative changes of the cervical spine without evidence of fractures. See above for details. 4. No new cervical alignment abnormality with multilevel degenerative listhesis and prominent upper cervical kyphosis. 5. Carotid atherosclerosis. Electronically Signed   By: Telford Nab M.D.   On: 10/22/2021 06:35    Assessment/Plan  1. Orthostatic hypotension Noted on exam ?dehydration vs med effect Reduce valsartan to 1/2 tab Treat BPs when in the sitting position not lying. Goal to prevent falls.  Encourage water q 2 hrs WA  2. Hematoma of scalp, subsequent encounter Noted Has no focal deficit with no acute intracranial findings.  Could repeat CT of worsening or not improving Check neuro checks qshift  3. Permanent atrial fibrillation (HCC) Remains on Eliquis for CVA risk reduction Rate is controlled with metoprolol  4. Major neurocognitive disorder, due to vascular disease, without behavioral disturbance, mild (HCC) Seems to have progressed in terms of her memory loss Needs more assistance, appropriate for AL Due to worsening confusion will check labs and UA  5. Malignant neoplasm of splenic flexure [facility]) Not currently receiving treatment Followed by Dr. Benay Spice    Family/ staff Communication: left message with POA  Labs/tests ordered:  CBC BMP UA C and S  Could not add TSH due to stat order.   Total time 60mn:  time greater than 50% of total time spent doing pt counseling and coordination of care

## 2021-10-26 NOTE — Telephone Encounter (Signed)
Reported Na 127, no significant change in her condition. Hx of hyponatremia, last Na 130 10/22/21. Fluid intake q 2 hrs already in place. Recommended to observe the patient closely, call back if condition changes.

## 2021-10-27 ENCOUNTER — Emergency Department (HOSPITAL_BASED_OUTPATIENT_CLINIC_OR_DEPARTMENT_OTHER)
Admission: EM | Admit: 2021-10-27 | Discharge: 2021-10-27 | Disposition: A | Discharge status: Home / Self Care | Payer: Medicare Other | Attending: Emergency Medicine | Admitting: Emergency Medicine

## 2021-10-27 ENCOUNTER — Encounter: Payer: Self-pay | Admitting: Adult Health

## 2021-10-27 ENCOUNTER — Other Ambulatory Visit: Payer: Self-pay

## 2021-10-27 ENCOUNTER — Emergency Department (HOSPITAL_BASED_OUTPATIENT_CLINIC_OR_DEPARTMENT_OTHER): Payer: Medicare Other

## 2021-10-27 ENCOUNTER — Non-Acute Institutional Stay: Payer: Medicare Other | Admitting: Adult Health

## 2021-10-27 ENCOUNTER — Encounter (HOSPITAL_BASED_OUTPATIENT_CLINIC_OR_DEPARTMENT_OTHER): Payer: Self-pay | Admitting: Obstetrics and Gynecology

## 2021-10-27 DIAGNOSIS — R1313 Dysphagia, pharyngeal phase: Secondary | ICD-10-CM | POA: Diagnosis not present

## 2021-10-27 DIAGNOSIS — W19XXXA Unspecified fall, initial encounter: Secondary | ICD-10-CM | POA: Insufficient documentation

## 2021-10-27 DIAGNOSIS — R410 Disorientation, unspecified: Secondary | ICD-10-CM | POA: Diagnosis not present

## 2021-10-27 DIAGNOSIS — R2681 Unsteadiness on feet: Secondary | ICD-10-CM | POA: Diagnosis not present

## 2021-10-27 DIAGNOSIS — S0990XA Unspecified injury of head, initial encounter: Secondary | ICD-10-CM | POA: Diagnosis not present

## 2021-10-27 DIAGNOSIS — Z7901 Long term (current) use of anticoagulants: Secondary | ICD-10-CM | POA: Insufficient documentation

## 2021-10-27 DIAGNOSIS — I4891 Unspecified atrial fibrillation: Secondary | ICD-10-CM | POA: Insufficient documentation

## 2021-10-27 DIAGNOSIS — S0003XA Contusion of scalp, initial encounter: Secondary | ICD-10-CM | POA: Diagnosis not present

## 2021-10-27 DIAGNOSIS — E039 Hypothyroidism, unspecified: Secondary | ICD-10-CM | POA: Diagnosis not present

## 2021-10-27 DIAGNOSIS — R41841 Cognitive communication deficit: Secondary | ICD-10-CM | POA: Diagnosis not present

## 2021-10-27 DIAGNOSIS — G3184 Mild cognitive impairment, so stated: Secondary | ICD-10-CM | POA: Diagnosis not present

## 2021-10-27 DIAGNOSIS — S0990XD Unspecified injury of head, subsequent encounter: Secondary | ICD-10-CM

## 2021-10-27 DIAGNOSIS — Z8673 Personal history of transient ischemic attack (TIA), and cerebral infarction without residual deficits: Secondary | ICD-10-CM | POA: Diagnosis not present

## 2021-10-27 DIAGNOSIS — N390 Urinary tract infection, site not specified: Secondary | ICD-10-CM

## 2021-10-27 DIAGNOSIS — E871 Hypo-osmolality and hyponatremia: Secondary | ICD-10-CM | POA: Diagnosis not present

## 2021-10-27 DIAGNOSIS — I951 Orthostatic hypotension: Secondary | ICD-10-CM

## 2021-10-27 DIAGNOSIS — I10 Essential (primary) hypertension: Secondary | ICD-10-CM | POA: Diagnosis not present

## 2021-10-27 LAB — BASIC METABOLIC PANEL
Anion gap: 10 (ref 5–15)
BUN: 15 mg/dL (ref 8–23)
CO2: 21 mmol/L — ABNORMAL LOW (ref 22–32)
Calcium: 8.9 mg/dL (ref 8.9–10.3)
Chloride: 95 mmol/L — ABNORMAL LOW (ref 98–111)
Creatinine, Ser: 0.71 mg/dL (ref 0.44–1.00)
GFR, Estimated: >60 mL/min (ref >60)
Glucose, Bld: 103 mg/dL — ABNORMAL HIGH (ref 70–99)
Potassium: 4.5 mmol/L (ref 3.5–5.1)
Sodium: 126 mmol/L — ABNORMAL LOW (ref 135–145)

## 2021-10-27 LAB — URINALYSIS, ROUTINE W REFLEX MICROSCOPIC
Bilirubin Urine: NEGATIVE
Glucose, UA: NEGATIVE mg/dL
Hgb urine dipstick: NEGATIVE
Ketones, ur: NEGATIVE mg/dL
Nitrite: POSITIVE — AB
Protein, ur: NEGATIVE mg/dL
Specific Gravity, Urine: 1.005 (ref 1.005–1.030)
pH: 6.5 (ref 5.0–8.0)

## 2021-10-27 LAB — CBC WITH DIFFERENTIAL/PLATELET
Abs Immature Granulocytes: 0.06 10*3/uL (ref 0.00–0.07)
Basophils Absolute: 0.1 10*3/uL (ref 0.0–0.1)
Basophils Relative: 1 %
Eosinophils Absolute: 0.1 10*3/uL (ref 0.0–0.5)
Eosinophils Relative: 1 %
HCT: 34.6 % — ABNORMAL LOW (ref 36.0–46.0)
Hemoglobin: 11.6 g/dL — ABNORMAL LOW (ref 12.0–15.0)
Immature Granulocytes: 1 %
Lymphocytes Relative: 14 %
Lymphs Abs: 1.4 10*3/uL (ref 0.7–4.0)
MCH: 33.2 pg (ref 26.0–34.0)
MCHC: 33.5 g/dL (ref 30.0–36.0)
MCV: 99.1 fL (ref 80.0–100.0)
Monocytes Absolute: 1 10*3/uL (ref 0.1–1.0)
Monocytes Relative: 10 %
Neutro Abs: 7.4 10*3/uL (ref 1.7–7.7)
Neutrophils Relative %: 73 %
Platelets: 186 10*3/uL (ref 150–400)
RBC: 3.49 MIL/uL — ABNORMAL LOW (ref 3.87–5.11)
RDW: 15.5 % (ref 11.5–15.5)
WBC: 10 10*3/uL (ref 4.0–10.5)
nRBC: 0 % (ref 0.0–0.2)

## 2021-10-27 MED ORDER — LIDOCAINE HCL (PF) 1 % IJ SOLN
INTRAMUSCULAR | Status: AC
  Administered 2021-10-27: 5 mL
  Filled 2021-10-27: qty 5

## 2021-10-27 MED ORDER — CEPHALEXIN 500 MG PO CAPS: 500.0000 mg | ORAL_CAPSULE | Freq: Three times a day (TID) | ORAL | 0 refills | Status: AC

## 2021-10-27 MED ORDER — SODIUM CHLORIDE 0.9 % IV SOLN
1.0000 g | Freq: Once | INTRAVENOUS | Status: DC
  Filled 2021-10-27: qty 10

## 2021-10-27 MED ORDER — SODIUM CHLORIDE 0.9 % IV BOLUS
500.0000 mL | Freq: Once | INTRAVENOUS | Status: AC
  Administered 2021-10-27: 500 mL via INTRAVENOUS

## 2021-10-27 MED ORDER — CEFTRIAXONE SODIUM 1 G IJ SOLR
1.0000 g | Freq: Once | INTRAMUSCULAR | Status: AC
  Administered 2021-10-27: 1 g via INTRAMUSCULAR
  Filled 2021-10-27: qty 10

## 2021-10-27 NOTE — Discharge Instructions (Addendum)
Patient with UTI which may be contributing to her mild confusion.  Take Keflex 3 times daily for the next 7 days.  Urine culture pending.  Sodium of 126.  Family reports increased fluid intake since being started on immunotherapy.  Recommend 1-1.5 L fluid restriction and recheck of sodium in the next few days.  Patient given small normal saline bolus here in the ED.  Repeat head CT continues to show scalp hematoma but no intracranial hemorrhage.  Return for new or worsening symptoms, specifically worsening confusion, seizure-like activity, weakness or severe headache, fever.

## 2021-10-27 NOTE — ED Notes (Signed)
Patient transported to CT 

## 2021-10-27 NOTE — ED Provider Notes (Signed)
Kathy Velez   CSN: [id number] Arrival date & time: 10/27/21  1117     History  Chief Complaint  Patient presents with   Kathy Velez    Kathy Velez is a 88 y.o. female.  Kathy Velez is a 88 y.o. female with a history of hypertension, hypothyroidism, stroke, A-fib on Eliquis, rheumatoid arthritis, who presents to the emergency department from [facility] for evaluation of confusion after recent fall.  Patient was evaluated in the emergency department on 7/30 for a fall on thinners.  Had CT scans done that showed a scalp hematoma but no intracranial injury.  Per facility she has had some increased confusion since that fall.  They checked blood work yesterday that showed that her sodium was 127, they report that her sodium typically runs around 130.  They were concerned she could have had delayed bleeding and sent her in for reevaluation.  She any headache, just some soreness over the scalp hematoma.  She reports that bruising has started to progress from the hematoma down her neck.  Denies neck pain.  No numbness or weakness.  No chest pain, shortness of breath or abdominal pain, no nausea or vomiting.  No seizure-like activity.  The history is provided by the patient, a relative, the nursing home and medical records.  Fall Pertinent negatives include no chest pain, no abdominal pain, no headaches and no shortness of breath.       Home Medications Prior to Admission medications   Medication Sig Start Date End Date Taking? Authorizing Provider  cephALEXin (KEFLEX) 500 MG capsule Take 1 capsule (500 mg total) by mouth 3 (three) times daily for 7 days. 10/27/21 11/03/21 Yes Jacqlyn Larsen, PA-C  acetaminophen (TYLENOL) 325 MG tablet Take 650 mg by mouth every 4 (four) hours as needed. 10/25/21 10/28/21  [provider]  amoxicillin (AMOXIL) 500 MG capsule Take 500 mg by mouth. Before dental visits every 6 months    [provider]  apixaban (ELIQUIS) 2.5 MG TABS tablet Take 1 tablet (2.5 mg total) by mouth 2 (two) times daily. 01/02/21   Virgie Dad, MD  atorvastatin (LIPITOR) 20 MG tablet TAKE 1 TABLET DAILY 06/14/21   Virgie Dad, MD  Cholecalciferol (VITAMIN D) 50 MCG (2000 UT) CAPS Take 1 capsule by mouth daily.    [provider]  digoxin (LANOXIN) 0.125 MG tablet TAKE 1 TABLET DAILY 06/14/21   Virgie Dad, MD  ferrous sulfate 325 (65 FE) MG tablet Take 325 mg by mouth 2 (two) times daily.    [provider]  hydrocortisone cream 1 % Apply 1 Application topically daily.    [provider]  leflunomide (ARAVA) 20 MG tablet Take 20 mg by mouth daily. 10/05/19   [provider]  levothyroxine (SYNTHROID) 75 MCG tablet TAKE 1 TABLET DAILY 30 MINUTES BEFORE BREAKFAST ON AN EMPTY STOMACH 09/14/21   Cleophas Dunker, Amy E, NP  metoprolol succinate (TOPROL-XL) 50 MG 24 hr tablet TAKE 1 TABLET DAILY 06/14/21   Virgie Dad, MD  mirtazapine (REMERON) 15 MG tablet TAKE 1 TABLET AT BEDTIME 02/20/21   Virgie Dad, MD  MULTIPLE VITAMIN PO Take by mouth.    [provider]  Multiple Vitamins-Minerals (PRESERVISION AREDS 2) CAPS Take 1 Dose by mouth 2 (two) times daily. For eye health    [provider]  omeprazole (PRILOSEC) 40 MG capsule TAKE 1 CAPSULE DAILY 10/17/21   Virgie Dad, MD  valsartan (DIOVAN) 80 MG tablet Take 0.5 tablets (40 mg total) by mouth daily. 10/26/21   Royal Hawthorn, NP  Vibegron (GEMTESA PO) Take 75 mg by mouth daily. Dose unknown    [provider]  vitamin B-12 (CYANOCOBALAMIN) 1000 MCG tablet Take 1,000 mcg by mouth daily.     [provider]      Allergies    Codeine, Gabapentin, Molds & smuts, Pollen extract, and Bee venom    Review of Systems   Review of Systems  Constitutional:  Negative for chills and fever.  Respiratory:  Negative for shortness of breath.   Cardiovascular:  Negative for chest pain.   Gastrointestinal:  Negative for abdominal pain, nausea and vomiting.  Genitourinary:  Negative for dysuria.  Musculoskeletal:  Negative for arthralgias and myalgias.  Skin:  Negative for color change and rash.  Neurological:  Negative for dizziness, seizures, syncope, weakness, numbness and headaches.  Psychiatric/Behavioral:  Positive for confusion.   All other systems reviewed and are negative.   Physical Exam Updated Vital Signs BP (!) 140/78 (BP Location: Left Arm)   Pulse 84   Temp 98.2 F (36.8 C)   Resp 20   Ht '5\' 2"'$  (1.575 m)   Wt 52.3 kg   SpO2 98%   BMI 21.11 kg/m  Physical Exam Vitals and nursing Velez reviewed.  Constitutional:      General: She is not in acute distress.    Appearance: Normal appearance. She is well-developed. She is not diaphoretic.  HENT:     Head: Normocephalic.     Comments: Large hematoma to the posterior scalp from fall 5 days ago, bruising now extending down the neck and onto the chest    Nose: Nose normal.     Mouth/Throat:     Mouth: Mucous membranes are moist.     Pharynx: Oropharynx is clear.  Eyes:     General:        Right eye: No discharge.        Left eye: No discharge.     Extraocular Movements: Extraocular movements intact.     Pupils: Pupils are equal, round, and reactive to light.  Neck:     Comments: No midline C-spine tenderness, no tenderness over the bruising on the neck, suspect this is more so from gravity from hematoma over the scalp Cardiovascular:     Rate and Rhythm: Normal rate and regular rhythm.     Pulses: Normal pulses.     Heart sounds: Normal heart sounds.  Pulmonary:     Effort: Pulmonary effort is normal. No respiratory distress.     Breath sounds: Normal breath sounds. No wheezing or rales.     Comments: Respirations equal and unlabored, patient able to speak in full sentences, lungs clear to auscultation bilaterally, some ecchymosis to the upper chest extending from hematoma on the back of the scalp.   No tenderness in this area.  Again suspect this is due to gravity from recent injury and hematoma Chest:     Chest wall: No tenderness.  Abdominal:     General: Bowel sounds are normal. There is no distension.     Palpations: Abdomen is soft. There is no mass.     Tenderness: There is no abdominal tenderness. There is no guarding.     Comments: Abdomen soft, nondistended, nontender to palpation in all quadrants without guarding or peritoneal signs, no CVA tenderness  Musculoskeletal:        General: No deformity.  Cervical back: Neck supple.  Skin:    General: Skin is warm and dry.     Capillary Refill: Capillary refill takes less than 2 seconds.  Neurological:     Mental Status: She is alert and oriented to person, place, and time.     Coordination: Coordination normal.     Comments: Speech is clear, able to follow commands CN III-XII intact Normal strength in upper and lower extremities bilaterally including dorsiflexion and plantar flexion, strong and equal grip strength Sensation normal to light and sharp touch Moves extremities without ataxia, coordination intact Ambulatory in department with walker with steady gait, no ataxia  Psychiatric:        Mood and Affect: Mood normal.        Behavior: Behavior normal.     ED Results / Procedures / Treatments   Labs (all labs ordered are listed, but only abnormal results are displayed) Labs Reviewed  BASIC METABOLIC PANEL - Abnormal; Notable for the following components:      Result Value   Sodium 126 (*)    Chloride 95 (*)    CO2 21 (*)    Glucose, Bld 103 (*)    All other components within normal limits  CBC WITH DIFFERENTIAL/PLATELET - Abnormal; Notable for the following components:   RBC 3.49 (*)    Hemoglobin 11.6 (*)    HCT 34.6 (*)    All other components within normal limits  URINALYSIS, ROUTINE W REFLEX MICROSCOPIC - Abnormal; Notable for the following components:   Nitrite POSITIVE (*)    Leukocytes,Ua LARGE  (*)    Bacteria, UA FEW (*)    All other components within normal limits  URINE CULTURE    EKG None  Radiology CT Head Wo Contrast  Result Date: 10/27/2021 CLINICAL DATA:  Fall with recent fall and scalp hematoma. EXAM: CT HEAD WITHOUT CONTRAST TECHNIQUE: Contiguous axial images were obtained from the base of the skull through the vertex without intravenous contrast. RADIATION DOSE REDUCTION: This exam was performed according to the departmental dose-optimization program which includes automated exposure control, adjustment of the mA and/or kV according to patient size and/or use of iterative reconstruction technique. COMPARISON:  10/22/2021 FINDINGS: Brain: Stable advanced cortical atrophy and moderately severe periventricular small vessel disease. The brain demonstrates no evidence of acute hemorrhage, infarction, edema, mass effect, extra-axial fluid collection, hydrocephalus or mass lesion. Vascular: No hyperdense vessel or unexpected calcification. Skull: No acute fracture identified. Sinuses/Orbits: No acute finding. Other: Within the region right parietal scalp hematoma seen previously near the vertex, increased acute hemorrhage is identified in an ovoid configuration measuring up to approximately 3 cm on the coronal reconstructions. No evidence of scalp soft tissue foreign body. IMPRESSION: Interval increased hemorrhage in the region recently imaged right parietal scalp hematoma with more prominent focal scalp hematoma present. No associated evidence of acute skull fracture. Stable appearance of brain without evidence of intracranial hemorrhage. Electronically Signed   By: Aletta Edouard M.D.   On: 10/27/2021 13:44    Procedures Procedures    Medications Ordered in ED Medications  sodium chloride 0.9 % bolus 500 mL ( Intravenous Stopped 10/27/21 1426)  cefTRIAXone (ROCEPHIN) injection 1 g (1 g Intramuscular Given 10/27/21 1453)  lidocaine (PF) (XYLOCAINE) 1 % injection (5 mLs  Given 10/27/21  1452)    ED Course/ Medical Decision Making/ A&P  Medical Decision Making Amount and/or Complexity of Data Reviewed Labs: ordered. Radiology: ordered.  Risk Prescription drug management.   88 y.o. female presents to the ED with complaints of confusion, low sodium, recent head injury, this involves an extensive number of treatment options, and is a complaint that carries with it a high risk of complications and morbidity.  The differential diagnosis includes concussion, delayed head bleed, worsening hyponatremia, UTI  On arrival pt is nontoxic, vitals mild hypertension, otherwise unremarkable. Exam significant for no focal neurologic deficits.  Posterior scalp hematoma noted with bruising now traveling down the neck and onto the chest without tenderness in this area, likely from gravity with large hematoma present.  Additional history obtained from chart review, patient's daughter via telephone. Previous records obtained and reviewed including imaging and lab work from ED encounter on 7/30 for fall    Lab Tests:  I Ordered, reviewed, and interpreted labs, which included: No leukocytosis and stable hemoglobin.  Sodium of 126 without significant change from recent labs checked at facility.  It appears that her baseline is usually between 130 and 133.  Patient does have some confusion, but also has signs of a UTI that could explain this.  No other electrolyte derangements, normal renal function.  Urine culture sent  Imaging Studies ordered:  I ordered imaging studies which included CT head, I independently visualized and interpreted imaging which showed increased parietal scalp hematoma but no skull fracture or intracranial hemorrhage noted.  ED Course:   Suspect confusion is mostly from UTI, could also have concussion from recent head injury that could be contributing.  No signs of delayed intracranial bleeding which is reassuring.  Patient without focal  neurologic deficits and able to ambulate with walker here in the department with steady gait.   Daughter reports that she was recently started on immune therapy and with that has been having dry mouth and has had significantly increased oral intake suspect this may be the cause for her mild hyponatremia.  I do not feel that she needs to be admitted to the hospital for this but I have recommended to the daughter and facility fluid restriction and recheck in the next few days.  Discussed return precautions with hyponatremia.  Patient and daughter expressed understanding and agreement with this plan.  Patient given dose of Rocephin here in the ED and will be discharged with Keflex for UTI, cultures pending.  Discussed return precautions.  Patient discharged back to [facility].  Portions of this Velez were generated with [employer]. Dictation errors may occur despite best attempts at proofreading.         Final Clinical Impression(s) / ED Diagnoses Final diagnoses:  Acute UTI  Hyponatremia  Head injury, subsequent encounter    Rx / DC Orders ED Discharge Orders          Ordered    cephALEXin (KEFLEX) 500 MG capsule  3 times daily        10/27/21 1521              Jacqlyn Larsen, [place] 10/27/21 1616    Varney Biles, MD 10/28/21 1650

## 2021-10-27 NOTE — ED Triage Notes (Signed)
Patient presents to the ER for evaluation after a mechanical fall. She reports she is on Eliquis. Patient was seen on 7/30 at [facility] and had CT scans done. Patient reportedly is also having some confusion per facility. Patietn had blood work completed yesterday through [facility] and her Sodium was noted to be 127 and was told she runs around 130 sodium wise at baseline. Patient also reportedly there are concerns about her having a brain bleed.

## 2021-10-27 NOTE — ED Notes (Signed)
Pt placed on bedpan

## 2021-10-27 NOTE — Progress Notes (Signed)
Location:    [facility] Room Number: 782 Place of Service:  ALF [phone]) Provider:  Royal Hawthorn, NP  Virgie Dad, MD  Patient Care Team: Virgie Dad, MD as PCP - General (Internal Medicine) Pixie Casino, MD as PCP - Cardiology (Cardiology) Pieter Partridge, DO as Consulting Physician (Neurology)  Extended Emergency Contact Information Primary Emergency Contact: [name] Address: [address]          [place], [place] [address] Johnnette Litter of [place] Phone: [phone] Mobile Phone: [phone] Relation: Daughter Secondary Emergency Contact: Eula Listen Address: [address]          [place], [place] [address] Johnnette Litter of [place] Mobile Phone: [phone] Relation: Son  Code Status:  DNR Goals of care: Advanced Directive information    10/27/2021    9:26 AM  Advanced Directives  Does Patient Have a Medical Advance Directive? Yes  Type of Advance Directive Out of facility DNR (pink MOST or yellow form)  Does patient want to make changes to medical advance directive? No - Patient declined  Pre-existing out of facility DNR order (yellow form or pink MOST form) Pink MOST/Yellow Form most recent copy in chart - Physician notified to receive inpatient order     Chief Complaint  Patient presents with   Acute Visit    Hyponatremia    HPI:  Pt is a 88 y.o. female seen today for an acute visit for f/u regarding hyponatremia and confusion.   Note 10/26/21 Of note she has a hx of colon ca in 2006 with recurrence. Noted by colonoscopy 02/22/21, bx showed well differentiated adenocarcinoma. CT in Jan of 2023 showed no residual disease. Had 4 cycles of pembrolizumab. Colonoscopy 09/20/21 showed no residual mass. She is holding off on further treatment due to side effects.  She has multiple health problems and complex care needs. She has been feeling dizzy upon standing and having falls. She had what was described as a mechanical  fall on 10/22/21 and hit her head. There was no LOC per the notes. She has a large hematoma to the scalp. CT of the head 10/22/21 showed no acute intracranial abnormality other than the aforementioned scalp hematoma.  BMP 7/30 NA 130 BUN 16 Cr 0.74 CBC WBC 11 Hgb 11.3 BP was high in the ED. Treated with valsartan and labetalol. Since improved.   The nurse did orthostatic bps 10/26/21 due to reports of dizziness when standing.    10/26/21: lying 160/74 84 sitting 128/62 68 standing 118/64 62    Update Note 10/27/21 This morning Kathy Velez remains confused wanting to know where her husband is and has an unsteady gait. She does have progressive memory loss but this is worse from her baseline. On 10/26/21 we did labs and UA due to confusion along with orthostatic hypotension.  The UA did not show sign of acute infection. There was no leukocytosis or dropping Hgb.  WBC 8.8 Hgb 11.2 Plt 190 MCV 100.8  Glucose 93 Ca 8.9 BUN 16 Na 127 Cr 0.71 C02 16 K 4.2  10/27/21 Orthostatic BP: Sitting: 160/77, HR 87 & Standing: 162/89, HR 120 Past Medical History:  Diagnosis Date   Allergy    seasonal   Anemia    Anxiety    Arthritis    Back   Atrial fibrillation ([facility])    Cancer ([facility]) 2006   Colon.  Basal Cell Skin cancer- right arm   Cataract    removed bilateral   Colon cancer (  [facility])    Constipation due to pain medication therapy    after heart surgery   Dysrhythmia    PAF   GERD (gastroesophageal reflux disease)    Heart murmur    Hyperlipidemia    Hypertension    Hypothyroidism    RA (rheumatoid arthritis) ([facility])    Restless leg    Seizures ([facility])    after Heart Surgey   Stroke ([facility])    TIA- found by neurologist after    Past Surgical History:  Procedure Laterality Date   ABDOMINAL HYSTERECTOMY  1970   Partial    COLON RESECTION  2006   cancer   COLON SURGERY     COLONOSCOPY     EYE SURGERY Bilateral    Cataract   MAXIMUM ACCESS (MAS)POSTERIOR LUMBAR INTERBODY FUSION (PLIF) 2 LEVEL N/A  04/12/2015   Procedure: Lumbar Three-Five Decompression, Pedicle Screw Fixation, and Posteriolateral Arthrodesis;  Surgeon: Erline Levine, MD;  Location: [facility];  Service: Neurosurgery;  Laterality: N/A;  L3-4 L4-5 Maximum access posterior lumbar fusion, possible interbodies and resection of synovial cyst at L4-5   MITRAL VALVE REPAIR  01/20/2013   Gore-tex cords to P1, P2, and P3. Magic suture to posterior medial commisure, #30 Physio 1 ring. Done in [place]   TONSILLECTOMY     about [place]  01/20/2013   #28 TriAd ring done in [place]    Allergies  Allergen Reactions   Codeine Other (See Comments)    "just don't take it well"   Gabapentin     dizziness   Molds & Smuts    Pollen Extract Swelling   Bee Venom Swelling and Rash    Allergies as of 10/27/2021       Reactions   Codeine Other (See Comments)   "just don't take it well"   Gabapentin    dizziness   Molds & Smuts    Pollen Extract Swelling   Bee Venom Swelling, Rash        Medication List        Accurate as of October 27, 2021  9:26 AM. If you have any questions, ask your nurse or doctor.          STOP taking these medications    Enbrel SureClick 50 MG/ML injection Generic drug: etanercept Stopped by: Royal Hawthorn, NP   prochlorperazine 10 MG tablet Commonly known as: COMPAZINE Stopped by: Royal Hawthorn, NP       TAKE these medications    acetaminophen 325 MG tablet Commonly known as: TYLENOL Take 650 mg by mouth every 4 (four) hours as needed. What changed: Another medication with the same name was removed. Continue taking this medication, and follow the directions you see here. Changed by: Royal Hawthorn, NP   amoxicillin 500 MG capsule Commonly known as: AMOXIL Take 500 mg by mouth. Before dental visits every 6 months   apixaban 2.5 MG Tabs tablet Commonly known as: Eliquis Take 1 tablet (2.5 mg total) by mouth 2 (two) times daily.   atorvastatin 20 MG  tablet Commonly known as: LIPITOR TAKE 1 TABLET DAILY   cyanocobalamin 1000 MCG tablet Commonly known as: VITAMIN B12 Take 1,000 mcg by mouth daily.   digoxin 0.125 MG tablet Commonly known as: LANOXIN TAKE 1 TABLET DAILY   ferrous sulfate 325 (65 FE) MG tablet Take 325 mg by mouth 2 (two) times daily. What changed: Another medication with the same name was removed. Continue taking this medication, and follow  the directions you see here. Changed by: Royal Hawthorn, NP   GEMTESA PO Take 75 mg by mouth daily. Dose unknown   hydrocortisone cream 1 % Apply 1 Application topically daily.   leflunomide 20 MG tablet Commonly known as: ARAVA Take 20 mg by mouth daily.   levothyroxine 75 MCG tablet Commonly known as: SYNTHROID TAKE 1 TABLET DAILY 30 MINUTES BEFORE BREAKFAST ON AN EMPTY STOMACH   metoprolol succinate 50 MG 24 hr tablet Commonly known as: TOPROL-XL TAKE 1 TABLET DAILY   mirtazapine 15 MG tablet Commonly known as: REMERON TAKE 1 TABLET AT BEDTIME   MULTIPLE VITAMIN PO Take by mouth.   omeprazole 40 MG capsule Commonly known as: PRILOSEC TAKE 1 CAPSULE DAILY   PreserVision AREDS 2 Caps Take 1 Dose by mouth 2 (two) times daily. For eye health   valsartan 80 MG tablet Commonly known as: DIOVAN Take 0.5 tablets (40 mg total) by mouth daily.   Vitamin D 50 MCG (2000 UT) Caps Take 1 capsule by mouth daily.        Review of Systems  Constitutional:  Negative for activity change, appetite change, chills, diaphoresis, fatigue, fever and unexpected weight change.  HENT:  Negative for congestion.   Respiratory:  Positive for shortness of breath (exertion). Negative for cough and wheezing.   Cardiovascular:  Negative for chest pain, palpitations and leg swelling.  Gastrointestinal:  Negative for abdominal distention, abdominal pain, constipation and diarrhea.  Genitourinary:  Negative for difficulty urinating, dysuria and flank pain.  Musculoskeletal:   Positive for arthralgias and gait problem. Negative for back pain, joint swelling and myalgias.  Neurological:  Negative for dizziness (when standing), tremors, seizures, syncope, facial asymmetry, speech difficulty, weakness, light-headedness, numbness and headaches.  Psychiatric/Behavioral:  Positive for confusion. Negative for agitation and behavioral problems.     Immunization History  Administered Date(s) Administered   Fluad Quad(high Dose 65+) 01/05/2019   H1N1 04/07/2008   Influenza Whole 12/29/2008, 12/22/2009   Influenza, High Dose Seasonal PF 01/15/2012, 12/23/2014, 01/22/2020   Influenza,inj,Quad PF,6+ Mos 01/22/2014, 12/10/2016, 01/17/2018   Influenza,trivalent, recombinat, inj, PF 01/05/2016   Influenza-Unspecified 04/07/2008, 12/15/2014, 01/13/2021   Moderna Sars-Covid-2 Vaccination 04/07/2019, 05/05/2019, 02/09/2020   PFIZER(Purple Top)SARS-COV-2 Vaccination 01/06/2021   PPD Test 07/17/2010, 04/19/2015, 04/25/2015   Pneumococcal Conjugate-13 11/10/2013   Pneumococcal Polysaccharide-23 12/27/2010, 12/05/2016   Tdap 04/15/2017   Zoster Recombinat (Shingrix) 04/09/2017, 06/18/2017   Pertinent  Health Maintenance Due  Topic Date Due   INFLUENZA VACCINE  10/24/2021   DEXA SCAN  Completed      07/04/2021   10:50 AM 07/25/2021   10:06 AM 08/02/2021    8:33 AM 08/04/2021   11:33 AM 10/22/2021    5:08 AM  Fall Risk  Falls in the past year?   0 0   Was there an injury with Fall?   0 0   Fall Risk Category Calculator   0 0   Fall Risk Category   Low Low   Patient Fall Risk Level High fall risk High fall risk Low fall risk Low fall risk High fall risk  Patient at Risk for Falls Due to   Impaired balance/gait Impaired balance/gait   Fall risk Follow up   Falls evaluation completed Falls evaluation completed    Functional Status Survey:    Vitals:   10/27/21 0918  BP: 136/74  Pulse: (!) 59  Resp: 18  Temp: (!) 97.1 F (36.2 C)  SpO2: 98%  Weight: 115 lb 6.4 oz (52.3  kg)  Height: '5\' 2"'$  (1.575 m)   Body mass index is 21.11 kg/m. Physical Exam Vitals and nursing note reviewed.  Constitutional:      General: She is not in acute distress.    Appearance: She is not diaphoretic.  HENT:     Head: Normocephalic and atraumatic.  Neck:     Vascular: No JVD.  Cardiovascular:     Rate and Rhythm: Normal rate. Rhythm irregular.     Heart sounds: No murmur heard. Pulmonary:     Effort: Pulmonary effort is normal. No respiratory distress.     Breath sounds: Normal breath sounds. No wheezing.  Skin:    General: Skin is warm and dry.  Neurological:     Mental Status: She is alert and oriented to person, place, and time.     Labs reviewed: Recent Labs    08/22/21 1134 10/03/21 1055 10/22/21 0501  NA 133* 131* 130*  K 4.2 4.6 3.9  CL 99 99 96*  CO2 24 21* 23  GLUCOSE 98 113* 117*  BUN '22 18 16  '$ CREATININE 0.86 0.79 0.74  CALCIUM 9.9 9.9 9.3   Recent Labs    07/25/21 0945 08/22/21 1134 10/03/21 1055  AST '20 22 22  '$ ALT '18 20 18  '$ ALKPHOS 86 84 75  BILITOT 0.6 0.6 0.8  PROT 6.8 7.5 7.1  ALBUMIN 4.3 4.5 4.5   Recent Labs    08/22/21 1134 10/03/21 1055 10/22/21 0501  WBC 9.4 8.0 11.0*  NEUTROABS 6.5 5.4 7.9*  HGB 12.4 11.3* 11.3*  HCT 37.6 32.8* 33.3*  MCV 100.8* 97.0 100.3*  PLT 203 220 202   Lab Results  Component Value Date   TSH 7.923 (H) 10/03/2021   Lab Results  Component Value Date   HGBA1C 5.5 01/12/2013   Lab Results  Component Value Date   CHOL 115 02/02/2021   HDL 48 02/02/2021   LDLCALC 49 02/02/2021   TRIG 91 02/02/2021    Significant Diagnostic Results in last 30 days:  CT Head Wo Contrast  Result Date: 10/22/2021 CLINICAL DATA:  Fall injury on blood thinners with right occipital head injury, neck pain. EXAM: CT HEAD WITHOUT CONTRAST CT CERVICAL SPINE WITHOUT CONTRAST TECHNIQUE: Multidetector CT imaging of the head and cervical spine was performed following the standard protocol without intravenous  contrast. Multiplanar CT image reconstructions of the cervical spine were also generated. RADIATION DOSE REDUCTION: This exam was performed according to the departmental dose-optimization program which includes automated exposure control, adjustment of the mA and/or kV according to patient size and/or use of iterative reconstruction technique. COMPARISON:  Head CT and cervical spine CT both 09/06/2020. FINDINGS: CT HEAD FINDINGS Brain: There is moderately advanced cerebral atrophy, small-vessel disease and atrophic ventriculomegaly. Mild cerebellar atrophy. No acute infarct, hemorrhage or mass are seen. There is no midline shift. The basal cisterns are clear. Vascular: There are calcifications in the carotid siphons and distal left vertebral artery. There are no hyperdense central vessels. Skull: There is a large right parietal convexity scalp hematoma. The calvarium and skull base are intact. There is a chronic right orbital floor fracture which was acute on the previous exam. Sinuses/Orbits: Old lens replacements.  No acute abnormality. Other: None. CT CERVICAL SPINE FINDINGS Alignment: Chronic upper cervical kyphosis is noted with grade 1 anterolisthesis at C2-3 and C3-4, trace anterolisthesis C6-7 and C7-T1 and grade 1 retrolisthesis at C5-6, all unchanged and all consistent with degenerative listhesis. There is no new or traumatic alignment abnormality. Anterior  atlantodental bone-on-bone joint space loss is again noted with osteophytes at the articular margins. Skull base and vertebrae: Osteopenia. No fracture is seen or primary bone lesion. Right anterior bulky bridging osteophytes are again shown C4-5 and C5-6. Soft tissues and spinal canal: No prevertebral fluid or swelling. No visible canal hematoma. There is moderate calcification in the proximal cervical ICAs. Small atrophic thyroid gland. Disc levels: Facet joint ankylosis again noted on the right at C3-4. There is chronic disc collapse C4-5, C5-6, C6-7  and moderate disc space loss C3-4. There are bidirectional osteophytes from C3-4 through C6-7 with variable thecal sac encroachment, greatest at C4-5 and C5-6 where there is spondylotic cord compression and narrowing of the AP thecal sac diameter to as little as 5 mm. Facet joint and uncinate hypertrophy again results in multilevel foraminal stenosis worst on the right at C4-5 and C5-6. Upper chest: There is biapical pleural-parenchymal scarring and scattered calcifications. Other: None IMPRESSION: 1. Large right occipital convexity scalp hematoma. No acute intracranial CT findings or depressed skull fractures. 2. Chronic right orbital floor fracture. 3. Osteopenia and advanced degenerative changes of the cervical spine without evidence of fractures. See above for details. 4. No new cervical alignment abnormality with multilevel degenerative listhesis and prominent upper cervical kyphosis. 5. Carotid atherosclerosis. Electronically Signed   By: Telford Nab M.D.   On: 10/22/2021 06:35   CT Cervical Spine Wo Contrast  Result Date: 10/22/2021 CLINICAL DATA:  Fall injury on blood thinners with right occipital head injury, neck pain. EXAM: CT HEAD WITHOUT CONTRAST CT CERVICAL SPINE WITHOUT CONTRAST TECHNIQUE: Multidetector CT imaging of the head and cervical spine was performed following the standard protocol without intravenous contrast. Multiplanar CT image reconstructions of the cervical spine were also generated. RADIATION DOSE REDUCTION: This exam was performed according to the departmental dose-optimization program which includes automated exposure control, adjustment of the mA and/or kV according to patient size and/or use of iterative reconstruction technique. COMPARISON:  Head CT and cervical spine CT both 09/06/2020. FINDINGS: CT HEAD FINDINGS Brain: There is moderately advanced cerebral atrophy, small-vessel disease and atrophic ventriculomegaly. Mild cerebellar atrophy. No acute infarct, hemorrhage or  mass are seen. There is no midline shift. The basal cisterns are clear. Vascular: There are calcifications in the carotid siphons and distal left vertebral artery. There are no hyperdense central vessels. Skull: There is a large right parietal convexity scalp hematoma. The calvarium and skull base are intact. There is a chronic right orbital floor fracture which was acute on the previous exam. Sinuses/Orbits: Old lens replacements.  No acute abnormality. Other: None. CT CERVICAL SPINE FINDINGS Alignment: Chronic upper cervical kyphosis is noted with grade 1 anterolisthesis at C2-3 and C3-4, trace anterolisthesis C6-7 and C7-T1 and grade 1 retrolisthesis at C5-6, all unchanged and all consistent with degenerative listhesis. There is no new or traumatic alignment abnormality. Anterior atlantodental bone-on-bone joint space loss is again noted with osteophytes at the articular margins. Skull base and vertebrae: Osteopenia. No fracture is seen or primary bone lesion. Right anterior bulky bridging osteophytes are again shown C4-5 and C5-6. Soft tissues and spinal canal: No prevertebral fluid or swelling. No visible canal hematoma. There is moderate calcification in the proximal cervical ICAs. Small atrophic thyroid gland. Disc levels: Facet joint ankylosis again noted on the right at C3-4. There is chronic disc collapse C4-5, C5-6, C6-7 and moderate disc space loss C3-4. There are bidirectional osteophytes from C3-4 through C6-7 with variable thecal sac encroachment, greatest at C4-5 and C5-6 where  there is spondylotic cord compression and narrowing of the AP thecal sac diameter to as little as 5 mm. Facet joint and uncinate hypertrophy again results in multilevel foraminal stenosis worst on the right at C4-5 and C5-6. Upper chest: There is biapical pleural-parenchymal scarring and scattered calcifications. Other: None IMPRESSION: 1. Large right occipital convexity scalp hematoma. No acute intracranial CT findings or  depressed skull fractures. 2. Chronic right orbital floor fracture. 3. Osteopenia and advanced degenerative changes of the cervical spine without evidence of fractures. See above for details. 4. No new cervical alignment abnormality with multilevel degenerative listhesis and prominent upper cervical kyphosis. 5. Carotid atherosclerosis. Electronically Signed   By: Telford Nab M.D.   On: 10/22/2021 06:35    Assessment/Plan  1. Confusion Worse from baseline After head injury due to fall on Eliquis with unsteady gait and dropping NA Recommend repeat head CT in ED UA was neg and there were no other sign of injection on prior work up   2. Unsteady gait See #1  3. Hyponatremia NA 127  has been dropping over time With dizziness and low bp when standing consider IVF  4. Orthostatic hypotension Valsartan was reduced  With low sodium could benefit from IVF   Family/ staff Communication: discussed with POA Daughter Hosie Spangle  Labs/tests ordered:   NA  Total time 18mn:  time greater than 50% of total time spent doing pt counseling and coordination of care

## 2021-10-28 DIAGNOSIS — D649 Anemia, unspecified: Secondary | ICD-10-CM | POA: Diagnosis not present

## 2021-10-28 DIAGNOSIS — N390 Urinary tract infection, site not specified: Secondary | ICD-10-CM | POA: Diagnosis not present

## 2021-10-29 LAB — URINE CULTURE: Culture: >=100000 — AB

## 2021-10-30 ENCOUNTER — Non-Acute Institutional Stay (SKILLED_NURSING_FACILITY): Payer: Medicare Other | Admitting: Internal Medicine

## 2021-10-30 ENCOUNTER — Encounter: Payer: Self-pay | Admitting: Internal Medicine

## 2021-10-30 ENCOUNTER — Telehealth (HOSPITAL_BASED_OUTPATIENT_CLINIC_OR_DEPARTMENT_OTHER): Payer: Self-pay | Admitting: *Deleted

## 2021-10-30 DIAGNOSIS — I4821 Permanent atrial fibrillation: Secondary | ICD-10-CM

## 2021-10-30 DIAGNOSIS — S0003XD Contusion of scalp, subsequent encounter: Secondary | ICD-10-CM | POA: Diagnosis not present

## 2021-10-30 DIAGNOSIS — E782 Mixed hyperlipidemia: Secondary | ICD-10-CM

## 2021-10-30 DIAGNOSIS — R2681 Unsteadiness on feet: Secondary | ICD-10-CM | POA: Diagnosis not present

## 2021-10-30 DIAGNOSIS — E871 Hypo-osmolality and hyponatremia: Secondary | ICD-10-CM

## 2021-10-30 DIAGNOSIS — C189 Malignant neoplasm of colon, unspecified: Secondary | ICD-10-CM

## 2021-10-30 DIAGNOSIS — R1313 Dysphagia, pharyngeal phase: Secondary | ICD-10-CM | POA: Diagnosis not present

## 2021-10-30 DIAGNOSIS — I951 Orthostatic hypotension: Secondary | ICD-10-CM

## 2021-10-30 DIAGNOSIS — R41841 Cognitive communication deficit: Secondary | ICD-10-CM | POA: Diagnosis not present

## 2021-10-30 DIAGNOSIS — E039 Hypothyroidism, unspecified: Secondary | ICD-10-CM | POA: Diagnosis not present

## 2021-10-30 DIAGNOSIS — Z8673 Personal history of transient ischemic attack (TIA), and cerebral infarction without residual deficits: Secondary | ICD-10-CM | POA: Diagnosis not present

## 2021-10-30 DIAGNOSIS — D509 Iron deficiency anemia, unspecified: Secondary | ICD-10-CM

## 2021-10-30 DIAGNOSIS — G3184 Mild cognitive impairment, so stated: Secondary | ICD-10-CM | POA: Diagnosis not present

## 2021-10-30 DIAGNOSIS — R410 Disorientation, unspecified: Secondary | ICD-10-CM

## 2021-10-30 LAB — CBC AND DIFFERENTIAL
HCT: 29 — AB (ref 36–46)
Hemoglobin: 9.8 — AB (ref 12.0–16.0)
Platelets: 191 10*3/uL (ref 150–400)
WBC: 7.1

## 2021-10-30 LAB — BASIC METABOLIC PANEL
BUN: 17 (ref 4–21)
CO2: 21 (ref 13–22)
Chloride: 99 (ref 99–108)
Creatinine: 0.7 (ref 0.5–1.1)
Glucose: 104
Potassium: 4.6 mEq/L (ref 3.5–5.1)
Sodium: 131 — AB (ref 137–147)

## 2021-10-30 LAB — CBC: RBC: 2.82 — AB (ref 3.87–5.11)

## 2021-10-30 LAB — COMPREHENSIVE METABOLIC PANEL
Calcium: 9.1 (ref 8.7–10.7)
eGFR: 83

## 2021-10-30 NOTE — Telephone Encounter (Signed)
Post ED Visit - Positive Culture Follow-up  Culture report reviewed by antimicrobial stewardship pharmacist: [facility] Team '[]'$  Elenor Quinones, Pharm.D. '[]'$  Heide Guile, Pharm.D., BCPS AQ-ID '[]'$  Parks Neptune, Pharm.D., BCPS '[]'$  Alycia Rossetti, Pharm.D., BCPS '[]'$  [place], Pharm.D., BCPS, AAHIVP '[]'$  Legrand Como, Pharm.D., BCPS, AAHIVP '[]'$  Salome Arnt, PharmD, BCPS '[]'$  Johnnette Gourd, PharmD, BCPS '[]'$  Hughes Better, PharmD, BCPS '[]'$  Leeroy Cha, PharmD '[]'$  Laqueta Linden, PharmD, BCPS '[x]'$  Shauna Hugh, PharmD  [facility] Team '[]'$  Leodis Sias, PharmD '[]'$  Lindell Spar, PharmD '[]'$  Royetta Asal, PharmD '[]'$  Graylin Shiver, Rph '[]'$  Rema Fendt) Glennon Mac, PharmD '[]'$  Arlyn Dunning, PharmD '[]'$  Netta Cedars, PharmD '[]'$  Dia Sitter, PharmD '[]'$  Leone Haven, PharmD '[]'$  Gretta Arab, PharmD '[]'$  Theodis Shove, PharmD '[]'$  Peggyann Juba, PharmD '[]'$  Reuel Boom, PharmD   Positive urine culture Treated with Cephalexin, organism sensitive to the same and no further patient follow-up is required at this time.  Rosie Fate 10/30/2021, 9:30 AM

## 2021-10-30 NOTE — Progress Notes (Signed)
Provider:  Veleta Miners MD Location:    [facility] Room Number: 341 Place of Service:  SNF [phone])  PCP: Virgie Dad, MD Patient Care Team: Virgie Dad, MD as PCP - General (Internal Medicine) Debara Pickett Nadean Corwin, MD as PCP - Cardiology (Cardiology) Pieter Partridge, DO as Consulting Physician (Neurology)  Extended Emergency Contact Information Primary Emergency Contact: [name],[name] Address: [address]          [place], [place] [address] Johnnette Litter of [place] Phone: [phone] Mobile Phone: [phone] Relation: Daughter Secondary Emergency Contact: Eula Listen Address: [address]          [place],  [address] Johnnette Litter of [employer] Phone: [phone] Relation: Son  Code Status: DNR Goals of Care: Advanced Directive information    10/30/2021    9:11 AM  Advanced Directives  Does Patient Have a Medical Advance Directive? Yes  Type of Advance Directive Out of facility DNR (pink MOST or yellow form)  Does patient want to make changes to medical advance directive? No - Patient declined  Pre-existing out of facility DNR order (yellow form or pink MOST form) Pink MOST/Yellow Form most recent copy in chart - Physician notified to receive inpatient order      Chief Complaint  Patient presents with   New Admit To SNF    Admission to SNF    HPI: Patient is a 88 y.o. female seen today for admission to Rehab  She was admitted to [facility] as not able to manage in [place] Confusion and Weakness  Patient was send to the ED for Hyponatremia Confusion and h/o Scalp Hematoma Since been rehab continues to be very confused per Nurses not doing her ADLS Also felling weak and tired Not orthostatic today Appetite is fair Patient did not have any new complains No fall since been here.   Patient has h/o  Colon Cancer with Anemia Treated with Immunotherapy Recent Colonoscopy showed Cancer resolved fornow Hypothyroidism H/o  ILD Rheumatoid arthritis Taken off embrel for immunotherapy Noe only on Arava Fecal Incontinence  MCI Last MMSE 20/30 MRI showed Cerebral Amyloid Angiopsthy  Past Medical History:  Diagnosis Date   Allergy    seasonal   Anemia    Anxiety    Arthritis    Back   Atrial fibrillation ([place])    Cancer ([facility]) 2006   Colon.  Basal Cell Skin cancer- right arm   Cataract    removed bilateral   Colon cancer (HCC)    Constipation due to pain medication therapy    after heart surgery   Dysrhythmia    PAF   GERD (gastroesophageal reflux disease)    Heart murmur    Hyperlipidemia    Hypertension    Hypothyroidism    RA (rheumatoid arthritis) ([place])    Restless leg    Seizures ([place])    after Heart Surgey   Stroke ([place])    TIA- found by neurologist after    Past Surgical History:  Procedure Laterality Date   ABDOMINAL HYSTERECTOMY  1970   Partial    COLON RESECTION  2006   cancer   COLON SURGERY     COLONOSCOPY     EYE SURGERY Bilateral    Cataract   MAXIMUM ACCESS (MAS)POSTERIOR LUMBAR INTERBODY FUSION (PLIF) 2 LEVEL N/A 04/12/2015   Procedure: Lumbar Three-Five Decompression, Pedicle Screw Fixation, and Posteriolateral Arthrodesis;  Surgeon: Erline Levine, MD;  Location: [facility];  Service: Neurosurgery;  Laterality: N/A;  L3-4  L4-5 Maximum access posterior lumbar fusion, possible interbodies and resection of synovial cyst at L4-5   MITRAL VALVE REPAIR  01/20/2013   Gore-tex cords to P1, P2, and P3. Magic suture to posterior medial commisure, #30 Physio 1 ring. Done in [place]   TONSILLECTOMY     about [place]  01/20/2013   #28 TriAd ring done in [place]    reports that she has never smoked. She has been exposed to tobacco smoke. She has never used smokeless tobacco. She reports current alcohol use of about 1.0 standard drink of alcohol per week. She reports that she does not use drugs. Social History   Socioeconomic History   Marital status:  Widowed    Spouse name: Not on file   Number of children: Not on file   Years of education: Not on file   Highest education level: Not on file  Occupational History   Occupation: elementary school teacher    Comment: retired   Tobacco Use   Smoking status: Never    Passive exposure: Past ("mother smoked")   Smokeless tobacco: Never  Vaping Use   Vaping Use: Never used  Substance and Sexual Activity   Alcohol use: Yes    Alcohol/week: 1.0 standard drink of alcohol    Types: 1 Glasses of wine per week    Comment: 1-2 per week   Drug use: No   Sexual activity: Not Currently  Other Topics Concern   Not on file  Social History Narrative   Social History      Diet? Healthy- low salt, sugar, fat      Do you drink/eat things with caffeine? On occasion      Marital status?        widow                        What year were you married? 1958      Do you live in a house, apartment, assisted living, condo, trailer, etc.? apartment      Is it one or more stories? one      How many persons live in your home? one      Do you have any pets in your home? (please list) no      Highest level of education completed? 4 year college      Current or past profession: Statistician      Do you exercise?             yes                         Type & how often? Classes- senior retirement community/ some walking      Advanced Directives      Do you have a living will?      Do you have a DNR form?                                  If not, do you want to discuss one?      Do you have signed POA/HPOA for forms?       Functional Status Completed by: Daughter, Di Kindle      Do you have difficulty bathing or dressing yourself? no      Do you have difficulty preparing food or eating? no      Do  you have difficulty managing your medications? no      Do you have difficulty managing your finances? no      Do you have difficulty affording your medications? no   Right handed    [facility]   Social Determinants of Health   Financial Resource Strain: Low Risk  (01/28/2018)   Overall Financial Resource Strain (CARDIA)    Difficulty of Paying Living Expenses: Not hard at all  Food Insecurity: No Food Insecurity (01/28/2018)   Hunger Vital Sign    Worried About Running Out of Food in the Last Year: Never true    Ran Out of Food in the Last Year: Never true  Transportation Needs: No Transportation Needs (01/28/2018)   PRAPARE - Hydrologist (Medical): No    Lack of Transportation (Non-Medical): No  Physical Activity: Sufficiently Active (01/28/2018)   Exercise Vital Sign    Days of Exercise per Week: 6 days    Minutes of Exercise per Session: 30 min  Stress: No Stress Concern Present (01/28/2018)   [place]    Feeling of Stress : Only a little  Social Connections: Moderately Isolated (01/28/2018)   Social Connection and Isolation Panel [NHANES]    Frequency of Communication with Friends and Family: More than three times a week    Frequency of Social Gatherings with Friends and Family: More than three times a week    Attends Religious Services: Never    Marine scientist or Organizations: No    Attends Archivist Meetings: Never    Marital Status: Widowed  Intimate Partner Violence: Not At Risk (01/28/2018)   Humiliation, Afraid, Rape, and Kick questionnaire    Fear of Current or Ex-Partner: No    Emotionally Abused: No    Physically Abused: No    Sexually Abused: No    Functional Status Survey:    Family History  Problem Relation Age of Onset   Lung disease Mother 59   Heart disease Father 67   Colon polyps Daughter    Alcohol abuse Son    Colon cancer Neg Hx    Stomach cancer Neg Hx    Rectal cancer Neg Hx    Crohn's disease Neg Hx    Esophageal cancer Neg Hx     Health Maintenance  Topic Date Due   COVID-19 Vaccine (5 - Booster for  Moderna series) 03/03/2021   INFLUENZA VACCINE  10/24/2021   TETANUS/TDAP  04/16/2027   Pneumonia Vaccine 1+ Years old  Completed   DEXA SCAN  Completed   Zoster Vaccines- Shingrix  Completed   HPV VACCINES  Aged Out    Allergies  Allergen Reactions   Codeine Other (See Comments)    "just don't take it well"   Gabapentin     dizziness   Molds & Smuts    Pollen Extract Swelling   Bee Venom Swelling and Rash    Allergies as of 10/30/2021       Reactions   Codeine Other (See Comments)   "just don't take it well"   Gabapentin    dizziness   Molds & Smuts    Pollen Extract Swelling   Bee Venom Swelling, Rash        Medication List        Accurate as of October 30, 2021  9:12 AM. If you have any questions, ask your nurse or doctor.  amoxicillin 500 MG capsule Commonly known as: AMOXIL Take 500 mg by mouth. Before dental visits every 6 months   apixaban 2.5 MG Tabs tablet Commonly known as: Eliquis Take 1 tablet (2.5 mg total) by mouth 2 (two) times daily.   atorvastatin 20 MG tablet Commonly known as: LIPITOR TAKE 1 TABLET DAILY   cephALEXin 500 MG capsule Commonly known as: KEFLEX Take 1 capsule (500 mg total) by mouth 3 (three) times daily for 7 days.   cyanocobalamin 1000 MCG tablet Commonly known as: VITAMIN B12 Take 1,000 mcg by mouth daily.   digoxin 0.125 MG tablet Commonly known as: LANOXIN TAKE 1 TABLET DAILY   ferrous sulfate 325 (65 FE) MG tablet Take 325 mg by mouth 2 (two) times daily.   GEMTESA PO Take 75 mg by mouth daily. Dose unknown   hydrocortisone cream 1 % Apply 1 Application topically daily.   leflunomide 20 MG tablet Commonly known as: ARAVA Take 20 mg by mouth daily.   levothyroxine 75 MCG tablet Commonly known as: SYNTHROID TAKE 1 TABLET DAILY 30 MINUTES BEFORE BREAKFAST ON AN EMPTY STOMACH   metoprolol succinate 50 MG 24 hr tablet Commonly known as: TOPROL-XL TAKE 1 TABLET DAILY   mirtazapine 15 MG  tablet Commonly known as: REMERON TAKE 1 TABLET AT BEDTIME   MULTIPLE VITAMIN PO Take by mouth.   omeprazole 40 MG capsule Commonly known as: PRILOSEC TAKE 1 CAPSULE DAILY   PreserVision AREDS 2 Caps Take 1 Dose by mouth 2 (two) times daily. For eye health   valsartan 80 MG tablet Commonly known as: DIOVAN Take 0.5 tablets (40 mg total) by mouth daily.   Vitamin D 50 MCG (2000 UT) Caps Take 1 capsule by mouth daily.        Review of Systems  Constitutional:  Positive for appetite change. Negative for activity change.  HENT: Negative.    Respiratory:  Negative for cough and shortness of breath.   Cardiovascular:  Negative for leg swelling.  Gastrointestinal:  Negative for constipation.  Genitourinary: Negative.   Musculoskeletal:  Negative for arthralgias, gait problem and myalgias.  Skin: Negative.   Neurological:  Positive for weakness. Negative for dizziness.  Psychiatric/Behavioral:  Positive for confusion. Negative for dysphoric mood and sleep disturbance.     Vitals:   10/30/21 0903  BP: 123/75  Pulse: 74  Resp: 17  Temp: (!) 97.5 F (36.4 C)  SpO2: 97%  Weight: 115 lb 6.4 oz (52.3 kg)  Height: '5\' 2"'$  (1.575 m)   Body mass index is 21.11 kg/m. Physical Exam Vitals reviewed.  Constitutional:      Appearance: Normal appearance.  HENT:     Head: Normocephalic.     Nose: Nose normal.     Mouth/Throat:     Mouth: Mucous membranes are moist.     Pharynx: Oropharynx is clear.  Eyes:     Pupils: Pupils are equal, round, and reactive to light.  Cardiovascular:     Rate and Rhythm: Normal rate and regular rhythm.     Pulses: Normal pulses.     Heart sounds: Normal heart sounds. No murmur heard. Pulmonary:     Effort: Pulmonary effort is normal.     Breath sounds: Normal breath sounds.  Abdominal:     General: Abdomen is flat. Bowel sounds are normal.     Palpations: Abdomen is soft.  Musculoskeletal:        General: No swelling.     Cervical back:  Neck supple.  Skin:    General: Skin is warm.  Neurological:     General: No focal deficit present.     Mental Status: She is alert.  Psychiatric:        Mood and Affect: Mood normal.        Thought Content: Thought content normal.     Labs reviewed: Basic Metabolic Panel: Recent Labs    10/03/21 1055 10/22/21 0501 10/26/21 0000 10/27/21 1320  NA 131* 130* 127* 126*  K 4.6 3.9 4.2 4.5  CL 99 96* 92* 95*  CO2 21* 23 28* 21*  GLUCOSE 113* 117*  --  103*  BUN '18 16 16 15  '$ CREATININE 0.79 0.74 0.7 0.71  CALCIUM 9.9 9.3 8.9 8.9   Liver Function Tests: Recent Labs    07/25/21 0945 08/22/21 1134 10/03/21 1055  AST '20 22 22  '$ ALT '18 20 18  '$ ALKPHOS 86 84 75  BILITOT 0.6 0.6 0.8  PROT 6.8 7.5 7.1  ALBUMIN 4.3 4.5 4.5   No results for input(s): "LIPASE", "AMYLASE" in the last 8760 hours. No results for input(s): "AMMONIA" in the last 8760 hours. CBC: Recent Labs    10/03/21 1055 10/22/21 0501 10/26/21 0000 10/27/21 1320  WBC 8.0 11.0* 8.8 10.0  NEUTROABS 5.4 7.9*  --  7.4  HGB 11.3* 11.3* 11.2* 11.6*  HCT 32.8* 33.3* 33* 34.6*  MCV 97.0 100.3*  --  99.1  PLT 220 202 190 186   Cardiac Enzymes: No results for input(s): "CKTOTAL", "CKMB", "CKMBINDEX", "TROPONINI" in the last 8760 hours. BNP: Invalid input(s): "POCBNP" Lab Results  Component Value Date   HGBA1C 5.5 01/12/2013   Lab Results  Component Value Date   TSH 7.923 (H) 10/03/2021   Lab Results  Component Value Date   VITAMINB12 1,083 (H) 01/16/2021   Lab Results  Component Value Date   FOLATE >23.4 01/16/2021   Lab Results  Component Value Date   IRON 40 02/02/2021   TIBC 441.0 01/16/2021   FERRITIN 26 05/16/2021    Imaging and Procedures obtained prior to SNF admission: CT Head Wo Contrast  Result Date: 10/27/2021 CLINICAL DATA:  Fall with recent fall and scalp hematoma. EXAM: CT HEAD WITHOUT CONTRAST TECHNIQUE: Contiguous axial images were obtained from the base of the skull through the  vertex without intravenous contrast. RADIATION DOSE REDUCTION: This exam was performed according to the departmental dose-optimization program which includes automated exposure control, adjustment of the mA and/or kV according to patient size and/or use of iterative reconstruction technique. COMPARISON:  10/22/2021 FINDINGS: Brain: Stable advanced cortical atrophy and moderately severe periventricular small vessel disease. The brain demonstrates no evidence of acute hemorrhage, infarction, edema, mass effect, extra-axial fluid collection, hydrocephalus or mass lesion. Vascular: No hyperdense vessel or unexpected calcification. Skull: No acute fracture identified. Sinuses/Orbits: No acute finding. Other: Within the region right parietal scalp hematoma seen previously near the vertex, increased acute hemorrhage is identified in an ovoid configuration measuring up to approximately 3 cm on the coronal reconstructions. No evidence of scalp soft tissue foreign body. IMPRESSION: Interval increased hemorrhage in the region recently imaged right parietal scalp hematoma with more prominent focal scalp hematoma present. No associated evidence of acute skull fracture. Stable appearance of brain without evidence of intracranial hemorrhage. Electronically Signed   By: Aletta Edouard M.D.   On: 10/27/2021 13:44    Assessment/Plan 1. Confusion Finishing her antibiotics for UTI CT also showed Scalp Hematoma Will Continue to monitor Will need to stay in SNF for  now  2. Unsteady gait   3. Hyponatremia Will start her on Sodium Tabs Can be due to Hematoma Also on Fluid restriction Repeat BMP   4. Orthostatic hypotension Mostly resolved for now   5. Hematoma of scalp, subsequent encounter Need to follow Clinically as she is on Eliquis  6. Permanent atrial fibrillation (HCC) On Elquis and Toprol and Digoxin  7. Iron deficiency anemia, unspecified iron deficiency anemia type Reduce Iron to QD ot help with  Appetite  Good iron stores   8. Mixed hyperlipidemia On statins  9. Acquired hypothyroidism TSH mildly high Will Need follow up TSH in few weeks  10. Adenocarcinoma of colon ([facility]) Repeat Colonoscopy did not show any cancer Off Immunotherapy right now 11 Urinary Incontinence on Gemtesa 12 Depression On Remeron  Family/ staff Communication:   Labs/tests ordered:

## 2021-10-31 ENCOUNTER — Encounter: Payer: Self-pay | Admitting: Orthopedic Surgery

## 2021-10-31 ENCOUNTER — Non-Acute Institutional Stay (SKILLED_NURSING_FACILITY): Payer: Medicare Other | Admitting: Orthopedic Surgery

## 2021-10-31 ENCOUNTER — Encounter: Payer: Self-pay | Admitting: Oncology

## 2021-10-31 ENCOUNTER — Encounter: Payer: Self-pay | Admitting: Internal Medicine

## 2021-10-31 DIAGNOSIS — C185 Malignant neoplasm of splenic flexure: Secondary | ICD-10-CM

## 2021-10-31 DIAGNOSIS — R2681 Unsteadiness on feet: Secondary | ICD-10-CM

## 2021-10-31 DIAGNOSIS — E871 Hypo-osmolality and hyponatremia: Secondary | ICD-10-CM

## 2021-10-31 DIAGNOSIS — I4821 Permanent atrial fibrillation: Secondary | ICD-10-CM | POA: Diagnosis not present

## 2021-10-31 DIAGNOSIS — D649 Anemia, unspecified: Secondary | ICD-10-CM

## 2021-10-31 DIAGNOSIS — F01A0 Vascular dementia, mild, without behavioral disturbance, psychotic disturbance, mood disturbance, and anxiety: Secondary | ICD-10-CM | POA: Diagnosis not present

## 2021-10-31 DIAGNOSIS — S0003XD Contusion of scalp, subsequent encounter: Secondary | ICD-10-CM

## 2021-10-31 NOTE — Progress Notes (Signed)
Location:   [facility] Room Number: 153A Place of Service:  SNF [phone]) Provider:  Windell Moulding, NP  Virgie Dad, MD  Patient Care Team: Virgie Dad, MD as PCP - General (Internal Medicine) Pixie Casino, MD as PCP - Cardiology (Cardiology) Pieter Partridge, DO as Consulting Physician (Neurology)  Extended Emergency Contact Information Primary Emergency Contact: [name],[name] Address: [place], [place] [address] Johnnette Litter of [place] Phone: [phone] Mobile Phone: [phone] Relation: Daughter Secondary Emergency Contact: Eula Listen Address: [address]          [place],  [address] Johnnette Litter of [place] Mobile Phone: [phone] Relation: Son  Code Status:  DNR Goals of care: Advanced Directive information    10/31/2021   10:15 AM  Advanced Directives  Does Patient Have a Medical Advance Directive? Yes  Type of Paramedic of [place];Out of facility DNR (pink MOST or yellow form)  Does patient want to make changes to medical advance directive? No - Patient declined  Copy of [place] in Chart? Yes - validated most recent copy scanned in chart (See row information)  Pre-existing out of facility DNR order (yellow form or pink MOST form) Yellow form placed in chart (order not valid for inpatient use)     Chief Complaint  Patient presents with   Acute Visit    Low hemoglobin    HPI:  Pt is a 88 y.o. female seen today for an acute visit for low hemoglobin.   H/o colon cancer in 2006 with reoccurrence. Followed by Dr. Benay Spice. Colonoscopy 02/22/2021 revealed adenocarcinoma. She has completed 4 cycles of pembrolizumab. Colonoscopy 09/20/2021 indicated no residual mass. At this time, she has paused treatment due to SE. In the past few weeks she has felt increased weakness and falling more frequently. 10/22/2021 she fell and developed hematoma to head. CT  head negative for acute intracranial abnormality. Sodium was low during this time, other labs unremarkable. BP was elevated, she was given valsartan and labetalol and pressures improved. 08/04 she was noted to have progressive confusion, weakness and frequent falls. 08/04 she presented to the ED. UA, WBC and Hgb unremarkable. She was given keflex by ED for suspected UTI (urine culture positive for E.coli). She was admitted to rehab at [facility] after discharge. 08/07 WBC 7.1, Hgb 9.8> was 11.6 (08/04), Na+ 131> was 126. She confirms black tarry stools within last 2 days. She also admits to feeling more lightheaded when standing.   10/30/2021 MMSE 20/30, correct shapes and sentence.     Past Medical History:  Diagnosis Date   Allergy    seasonal   Anemia    Anxiety    Arthritis    Back   Atrial fibrillation ([place])    Cancer ([place]) 2006   Colon.  Basal Cell Skin cancer- right arm   Cataract    removed bilateral   Colon cancer (HCC)    Constipation due to pain medication therapy    after heart surgery   Dysrhythmia    PAF   GERD (gastroesophageal reflux disease)    Heart murmur    Hyperlipidemia    Hypertension    Hypothyroidism    RA (rheumatoid arthritis) ([place])    Restless leg    Seizures ([place])    after Heart Surgey   Stroke ([place])    TIA- found by neurologist after    Past Surgical History:  Procedure Laterality Date   ABDOMINAL HYSTERECTOMY  1970   Partial    COLON RESECTION  2006   cancer   COLON SURGERY     COLONOSCOPY     EYE SURGERY Bilateral    Cataract   MAXIMUM ACCESS (MAS)POSTERIOR LUMBAR INTERBODY FUSION (PLIF) 2 LEVEL N/A 04/12/2015   Procedure: Lumbar Three-Five Decompression, Pedicle Screw Fixation, and Posteriolateral Arthrodesis;  Surgeon: Erline Levine, MD;  Location: [facility];  Service: Neurosurgery;  Laterality: N/A;  L3-4 L4-5 Maximum access posterior lumbar fusion, possible interbodies and resection of synovial cyst at L4-5   MITRAL VALVE REPAIR   01/20/2013   Gore-tex cords to P1, P2, and P3. Magic suture to posterior medial commisure, #30 Physio 1 ring. Done in [place]   TONSILLECTOMY     about [place]  01/20/2013   #28 TriAd ring done in [place]    Allergies  Allergen Reactions   Codeine Other (See Comments)    "just don't take it well"   Gabapentin     dizziness   Molds & Smuts    Pollen Extract Swelling   Bee Venom Swelling and Rash    Allergies as of 10/31/2021       Reactions   Codeine Other (See Comments)   "just don't take it well"   Gabapentin    dizziness   Molds & Smuts    Pollen Extract Swelling   Bee Venom Swelling, Rash        Medication List        Accurate as of October 31, 2021 10:24 AM. If you have any questions, ask your nurse or doctor.          amoxicillin 500 MG capsule Commonly known as: AMOXIL Take 500 mg by mouth. Before dental visits every 6 months   apixaban 2.5 MG Tabs tablet Commonly known as: Eliquis Take 1 tablet (2.5 mg total) by mouth 2 (two) times daily.   atorvastatin 20 MG tablet Commonly known as: LIPITOR TAKE 1 TABLET DAILY   cephALEXin 500 MG capsule Commonly known as: KEFLEX Take 1 capsule (500 mg total) by mouth 3 (three) times daily for 7 days.   cyanocobalamin 1000 MCG tablet Commonly known as: VITAMIN B12 Take 1,000 mcg by mouth daily.   digoxin 0.125 MG tablet Commonly known as: LANOXIN TAKE 1 TABLET DAILY   ferrous sulfate 325 (65 FE) MG tablet Take 325 mg by mouth 2 (two) times daily.   GEMTESA PO Take 75 mg by mouth daily. Dose unknown   hydrocortisone cream 1 % Apply 1 Application topically daily.   leflunomide 20 MG tablet Commonly known as: ARAVA Take 20 mg by mouth daily.   levothyroxine 75 MCG tablet Commonly known as: SYNTHROID TAKE 1 TABLET DAILY 30 MINUTES BEFORE BREAKFAST ON AN EMPTY STOMACH   metoprolol succinate 50 MG 24 hr tablet Commonly known as: TOPROL-XL TAKE 1 TABLET DAILY   mirtazapine 15  MG tablet Commonly known as: REMERON TAKE 1 TABLET AT BEDTIME   MULTIPLE VITAMIN PO Take by mouth.   omeprazole 40 MG capsule Commonly known as: PRILOSEC TAKE 1 CAPSULE DAILY   PreserVision AREDS 2 Caps Take 1 Dose by mouth 2 (two) times daily. For eye health   valsartan 80 MG tablet Commonly known as: DIOVAN Take 0.5 tablets (40 mg total) by mouth daily.   Vitamin D 50 MCG (2000 UT) Caps Take 1 capsule by mouth daily.  Review of Systems  Constitutional:  Positive for activity change and fatigue. Negative for appetite change and fever.  HENT:  Negative for trouble swallowing.   Eyes:  Negative for visual disturbance.  Respiratory:  Negative for cough, shortness of breath and wheezing.   Cardiovascular:  Negative for chest pain and leg swelling.  Gastrointestinal:  Positive for blood in stool. Negative for abdominal distention, abdominal pain, constipation, diarrhea, nausea and vomiting.  Genitourinary:  Negative for dysuria, frequency and hematuria.  Musculoskeletal:  Positive for gait problem.  Skin:  Positive for wound.  Neurological:  Positive for dizziness, weakness and light-headedness.  Psychiatric/Behavioral:  Positive for confusion. Negative for dysphoric mood. The patient is not nervous/anxious.     Immunization History  Administered Date(s) Administered   Fluad Quad(high Dose 65+) 01/05/2019   H1N1 04/07/2008   Influenza Whole 12/29/2008, 12/22/2009   Influenza, High Dose Seasonal PF 01/15/2012, 12/23/2014, 01/22/2020   Influenza,inj,Quad PF,6+ Mos 01/22/2014, 12/10/2016, 01/17/2018   Influenza,trivalent, recombinat, inj, PF 01/05/2016   Influenza-Unspecified 04/07/2008, 12/15/2014, 01/13/2021   Moderna Sars-Covid-2 Vaccination 04/07/2019, 05/05/2019, 02/09/2020   PFIZER(Purple Top)SARS-COV-2 Vaccination 01/06/2021   PPD Test 07/17/2010, 04/19/2015, 04/25/2015   Pneumococcal Conjugate-13 11/10/2013   Pneumococcal Polysaccharide-23 12/27/2010,  12/05/2016   Tdap 04/15/2017   Zoster Recombinat (Shingrix) 04/09/2017, 06/18/2017   Pertinent  Health Maintenance Due  Topic Date Due   INFLUENZA VACCINE  10/24/2021   DEXA SCAN  Completed      07/25/2021   10:06 AM 08/02/2021    8:33 AM 08/04/2021   11:33 AM 10/22/2021    5:08 AM 10/27/2021   11:31 AM  Fall Risk  Falls in the past year?  0 0    Was there an injury with Fall?  0 0    Fall Risk Category Calculator  0 0    Fall Risk Category  Low Low    Patient Fall Risk Level High fall risk Low fall risk Low fall risk High fall risk High fall risk  Patient at Risk for Falls Due to  Impaired balance/gait Impaired balance/gait    Fall risk Follow up  Falls evaluation completed Falls evaluation completed     Functional Status Survey:    Vitals:   10/31/21 0959  BP: (!) 159/76  Pulse: 86  Resp: 20  Temp: 98.2 F (36.8 C)  SpO2: 96%  Weight: 115 lb 9.6 oz (52.4 kg)  Height: '5\' 2"'$  (1.575 m)   Body mass index is 21.14 kg/m. Physical Exam Vitals reviewed.  Constitutional:      General: She is not in acute distress. HENT:     Head: Normocephalic.  Eyes:     General:        Right eye: No discharge.        Left eye: No discharge.  Cardiovascular:     Rate and Rhythm: Normal rate. Rhythm irregular.     Pulses: Normal pulses.     Heart sounds: Normal heart sounds.  Pulmonary:     Effort: Pulmonary effort is normal. No respiratory distress.     Breath sounds: Normal breath sounds. No wheezing.  Abdominal:     General: Bowel sounds are normal. There is no distension.     Palpations: Abdomen is soft. There is no mass.     Tenderness: There is no abdominal tenderness. There is no guarding or rebound.     Hernia: No hernia is present.  Musculoskeletal:     Cervical back: Neck supple.     Right  lower leg: No edema.     Left lower leg: No edema.  Skin:    General: Skin is warm and dry.     Capillary Refill: Capillary refill takes less than 2 seconds.     Comments: Posterior  scalp with small hematoma  Neurological:     General: No focal deficit present.     Mental Status: She is alert. Mental status is at baseline.     Motor: Weakness present.     Gait: Gait abnormal.     Comments: walker  Psychiatric:        Mood and Affect: Mood normal.        Behavior: Behavior normal.     Comments: Very pleasant, follows commands, alert to self/person     Labs reviewed: Recent Labs    10/03/21 1055 10/22/21 0501 10/26/21 0000 10/27/21 1320  NA 131* 130* 127* 126*  K 4.6 3.9 4.2 4.5  CL 99 96* 92* 95*  CO2 21* 23 28* 21*  GLUCOSE 113* 117*  --  103*  BUN '18 16 16 15  '$ CREATININE 0.79 0.74 0.7 0.71  CALCIUM 9.9 9.3 8.9 8.9   Recent Labs    07/25/21 0945 08/22/21 1134 10/03/21 1055  AST '20 22 22  '$ ALT '18 20 18  '$ ALKPHOS 86 84 75  BILITOT 0.6 0.6 0.8  PROT 6.8 7.5 7.1  ALBUMIN 4.3 4.5 4.5   Recent Labs    10/03/21 1055 10/22/21 0501 10/26/21 0000 10/27/21 1320  WBC 8.0 11.0* 8.8 10.0  NEUTROABS 5.4 7.9*  --  7.4  HGB 11.3* 11.3* 11.2* 11.6*  HCT 32.8* 33.3* 33* 34.6*  MCV 97.0 100.3*  --  99.1  PLT 220 202 190 186   Lab Results  Component Value Date   TSH 7.923 (H) 10/03/2021   Lab Results  Component Value Date   HGBA1C 5.5 01/12/2013   Lab Results  Component Value Date   CHOL 115 02/02/2021   HDL 48 02/02/2021   LDLCALC 49 02/02/2021   TRIG 91 02/02/2021    Significant Diagnostic Results in last 30 days:  CT Head Wo Contrast  Result Date: 10/27/2021 CLINICAL DATA:  Fall with recent fall and scalp hematoma. EXAM: CT HEAD WITHOUT CONTRAST TECHNIQUE: Contiguous axial images were obtained from the base of the skull through the vertex without intravenous contrast. RADIATION DOSE REDUCTION: This exam was performed according to the departmental dose-optimization program which includes automated exposure control, adjustment of the mA and/or kV according to patient size and/or use of iterative reconstruction technique. COMPARISON:  10/22/2021  FINDINGS: Brain: Stable advanced cortical atrophy and moderately severe periventricular small vessel disease. The brain demonstrates no evidence of acute hemorrhage, infarction, edema, mass effect, extra-axial fluid collection, hydrocephalus or mass lesion. Vascular: No hyperdense vessel or unexpected calcification. Skull: No acute fracture identified. Sinuses/Orbits: No acute finding. Other: Within the region right parietal scalp hematoma seen previously near the vertex, increased acute hemorrhage is identified in an ovoid configuration measuring up to approximately 3 cm on the coronal reconstructions. No evidence of scalp soft tissue foreign body. IMPRESSION: Interval increased hemorrhage in the region recently imaged right parietal scalp hematoma with more prominent focal scalp hematoma present. No associated evidence of acute skull fracture. Stable appearance of brain without evidence of intracranial hemorrhage. Electronically Signed   By: Aletta Edouard M.D.   On: 10/27/2021 13:44   CT Head Wo Contrast  Result Date: 10/22/2021 CLINICAL DATA:  Fall injury on blood thinners with right occipital  head injury, neck pain. EXAM: CT HEAD WITHOUT CONTRAST CT CERVICAL SPINE WITHOUT CONTRAST TECHNIQUE: Multidetector CT imaging of the head and cervical spine was performed following the standard protocol without intravenous contrast. Multiplanar CT image reconstructions of the cervical spine were also generated. RADIATION DOSE REDUCTION: This exam was performed according to the departmental dose-optimization program which includes automated exposure control, adjustment of the mA and/or kV according to patient size and/or use of iterative reconstruction technique. COMPARISON:  Head CT and cervical spine CT both 09/06/2020. FINDINGS: CT HEAD FINDINGS Brain: There is moderately advanced cerebral atrophy, small-vessel disease and atrophic ventriculomegaly. Mild cerebellar atrophy. No acute infarct, hemorrhage or mass are  seen. There is no midline shift. The basal cisterns are clear. Vascular: There are calcifications in the carotid siphons and distal left vertebral artery. There are no hyperdense central vessels. Skull: There is a large right parietal convexity scalp hematoma. The calvarium and skull base are intact. There is a chronic right orbital floor fracture which was acute on the previous exam. Sinuses/Orbits: Old lens replacements.  No acute abnormality. Other: None. CT CERVICAL SPINE FINDINGS Alignment: Chronic upper cervical kyphosis is noted with grade 1 anterolisthesis at C2-3 and C3-4, trace anterolisthesis C6-7 and C7-T1 and grade 1 retrolisthesis at C5-6, all unchanged and all consistent with degenerative listhesis. There is no new or traumatic alignment abnormality. Anterior atlantodental bone-on-bone joint space loss is again noted with osteophytes at the articular margins. Skull base and vertebrae: Osteopenia. No fracture is seen or primary bone lesion. Right anterior bulky bridging osteophytes are again shown C4-5 and C5-6. Soft tissues and spinal canal: No prevertebral fluid or swelling. No visible canal hematoma. There is moderate calcification in the proximal cervical ICAs. Small atrophic thyroid gland. Disc levels: Facet joint ankylosis again noted on the right at C3-4. There is chronic disc collapse C4-5, C5-6, C6-7 and moderate disc space loss C3-4. There are bidirectional osteophytes from C3-4 through C6-7 with variable thecal sac encroachment, greatest at C4-5 and C5-6 where there is spondylotic cord compression and narrowing of the AP thecal sac diameter to as little as 5 mm. Facet joint and uncinate hypertrophy again results in multilevel foraminal stenosis worst on the right at C4-5 and C5-6. Upper chest: There is biapical pleural-parenchymal scarring and scattered calcifications. Other: None IMPRESSION: 1. Large right occipital convexity scalp hematoma. No acute intracranial CT findings or depressed  skull fractures. 2. Chronic right orbital floor fracture. 3. Osteopenia and advanced degenerative changes of the cervical spine without evidence of fractures. See above for details. 4. No new cervical alignment abnormality with multilevel degenerative listhesis and prominent upper cervical kyphosis. 5. Carotid atherosclerosis. Electronically Signed   By: Telford Nab M.D.   On: 10/22/2021 06:35   CT Cervical Spine Wo Contrast  Result Date: 10/22/2021 CLINICAL DATA:  Fall injury on blood thinners with right occipital head injury, neck pain. EXAM: CT HEAD WITHOUT CONTRAST CT CERVICAL SPINE WITHOUT CONTRAST TECHNIQUE: Multidetector CT imaging of the head and cervical spine was performed following the standard protocol without intravenous contrast. Multiplanar CT image reconstructions of the cervical spine were also generated. RADIATION DOSE REDUCTION: This exam was performed according to the departmental dose-optimization program which includes automated exposure control, adjustment of the mA and/or kV according to patient size and/or use of iterative reconstruction technique. COMPARISON:  Head CT and cervical spine CT both 09/06/2020. FINDINGS: CT HEAD FINDINGS Brain: There is moderately advanced cerebral atrophy, small-vessel disease and atrophic ventriculomegaly. Mild cerebellar atrophy. No acute infarct,  hemorrhage or mass are seen. There is no midline shift. The basal cisterns are clear. Vascular: There are calcifications in the carotid siphons and distal left vertebral artery. There are no hyperdense central vessels. Skull: There is a large right parietal convexity scalp hematoma. The calvarium and skull base are intact. There is a chronic right orbital floor fracture which was acute on the previous exam. Sinuses/Orbits: Old lens replacements.  No acute abnormality. Other: None. CT CERVICAL SPINE FINDINGS Alignment: Chronic upper cervical kyphosis is noted with grade 1 anterolisthesis at C2-3 and C3-4,  trace anterolisthesis C6-7 and C7-T1 and grade 1 retrolisthesis at C5-6, all unchanged and all consistent with degenerative listhesis. There is no new or traumatic alignment abnormality. Anterior atlantodental bone-on-bone joint space loss is again noted with osteophytes at the articular margins. Skull base and vertebrae: Osteopenia. No fracture is seen or primary bone lesion. Right anterior bulky bridging osteophytes are again shown C4-5 and C5-6. Soft tissues and spinal canal: No prevertebral fluid or swelling. No visible canal hematoma. There is moderate calcification in the proximal cervical ICAs. Small atrophic thyroid gland. Disc levels: Facet joint ankylosis again noted on the right at C3-4. There is chronic disc collapse C4-5, C5-6, C6-7 and moderate disc space loss C3-4. There are bidirectional osteophytes from C3-4 through C6-7 with variable thecal sac encroachment, greatest at C4-5 and C5-6 where there is spondylotic cord compression and narrowing of the AP thecal sac diameter to as little as 5 mm. Facet joint and uncinate hypertrophy again results in multilevel foraminal stenosis worst on the right at C4-5 and C5-6. Upper chest: There is biapical pleural-parenchymal scarring and scattered calcifications. Other: None IMPRESSION: 1. Large right occipital convexity scalp hematoma. No acute intracranial CT findings or depressed skull fractures. 2. Chronic right orbital floor fracture. 3. Osteopenia and advanced degenerative changes of the cervical spine without evidence of fractures. See above for details. 4. No new cervical alignment abnormality with multilevel degenerative listhesis and prominent upper cervical kyphosis. 5. Carotid atherosclerosis. Electronically Signed   By: Telford Nab M.D.   On: 10/22/2021 06:35    Assessment/Plan: 1. Low hemoglobin - Hgb 9.8 (08/07)> was 11.6 (08/04) - reports black stools, increased lightheadedness  - hemoccults x 3 - orthostatic blood pressures daily x 2  days - cbc/diff 11/06/2021 - hold Eliquis- provider to reassess 08/14   2. Hyponatremia - improving - Na+ 131 (08/07) > was 126 (08/04)  - cont sodium tablet daily - bmp 11/06/2021  3. Unsteady gait - no recent falls  - consider PT/OT evaluation   4. Major neurocognitive disorder, due to vascular disease, without behavioral disturbance, mild ([place]) - followed by neurology - no behaviors - 08/07 MMSE 20/30, correct shapes and sentence - worsening confusion 08/04- urine culture + E.coli- on Keflex  5. Malignant neoplasm of splenic flexure ([place]) - followed by Dr. Benay Spice- f/u 09/18 - treatment paused at this time  6. Permanent atrial fibrillation (HCC) - HR controlled with Digoxin - Eliquis held- see above  7. Hematoma of scalp, subsequent encounter - due to fall 07/30 - improving    Family/ staff Communication: plan discussed with patient and nurse  Labs/tests ordered:  cbc/diff, bmp 11/06/2021

## 2021-11-01 DIAGNOSIS — N3944 Nocturnal enuresis: Secondary | ICD-10-CM | POA: Diagnosis not present

## 2021-11-01 DIAGNOSIS — M6389 Disorders of muscle in diseases classified elsewhere, multiple sites: Secondary | ICD-10-CM | POA: Diagnosis not present

## 2021-11-01 DIAGNOSIS — R278 Other lack of coordination: Secondary | ICD-10-CM | POA: Diagnosis not present

## 2021-11-01 DIAGNOSIS — Z9181 History of falling: Secondary | ICD-10-CM | POA: Diagnosis not present

## 2021-11-01 DIAGNOSIS — R350 Frequency of micturition: Secondary | ICD-10-CM | POA: Diagnosis not present

## 2021-11-01 DIAGNOSIS — N390 Urinary tract infection, site not specified: Secondary | ICD-10-CM | POA: Diagnosis not present

## 2021-11-01 NOTE — Telephone Encounter (Signed)
Message forwarded to Virgie Dad, MD

## 2021-11-01 NOTE — Telephone Encounter (Addendum)
Below is Dr.Gupta's reply:   Virgie Dad, MD  You 13 minutes ago (11:50 AM)    Chrae Can you please see if we can write off that charge for her ?  Anjali    In response to the above message I printed this encounter for Kathy Velez, pratice administrator to further follow-up on as I do not have access or training to address billing issues

## 2021-11-02 DIAGNOSIS — Z8673 Personal history of transient ischemic attack (TIA), and cerebral infarction without residual deficits: Secondary | ICD-10-CM | POA: Diagnosis not present

## 2021-11-02 DIAGNOSIS — R278 Other lack of coordination: Secondary | ICD-10-CM | POA: Diagnosis not present

## 2021-11-02 DIAGNOSIS — G3184 Mild cognitive impairment, so stated: Secondary | ICD-10-CM | POA: Diagnosis not present

## 2021-11-02 DIAGNOSIS — Z9181 History of falling: Secondary | ICD-10-CM | POA: Diagnosis not present

## 2021-11-02 DIAGNOSIS — M6389 Disorders of muscle in diseases classified elsewhere, multiple sites: Secondary | ICD-10-CM | POA: Diagnosis not present

## 2021-11-02 DIAGNOSIS — R1313 Dysphagia, pharyngeal phase: Secondary | ICD-10-CM | POA: Diagnosis not present

## 2021-11-02 DIAGNOSIS — R41841 Cognitive communication deficit: Secondary | ICD-10-CM | POA: Diagnosis not present

## 2021-11-02 DIAGNOSIS — N390 Urinary tract infection, site not specified: Secondary | ICD-10-CM | POA: Diagnosis not present

## 2021-11-05 DIAGNOSIS — N390 Urinary tract infection, site not specified: Secondary | ICD-10-CM | POA: Diagnosis not present

## 2021-11-06 DIAGNOSIS — R1313 Dysphagia, pharyngeal phase: Secondary | ICD-10-CM | POA: Diagnosis not present

## 2021-11-06 DIAGNOSIS — N390 Urinary tract infection, site not specified: Secondary | ICD-10-CM | POA: Diagnosis not present

## 2021-11-06 DIAGNOSIS — Z8673 Personal history of transient ischemic attack (TIA), and cerebral infarction without residual deficits: Secondary | ICD-10-CM | POA: Diagnosis not present

## 2021-11-06 DIAGNOSIS — Z9181 History of falling: Secondary | ICD-10-CM | POA: Diagnosis not present

## 2021-11-06 DIAGNOSIS — G3184 Mild cognitive impairment, so stated: Secondary | ICD-10-CM | POA: Diagnosis not present

## 2021-11-06 DIAGNOSIS — R41841 Cognitive communication deficit: Secondary | ICD-10-CM | POA: Diagnosis not present

## 2021-11-06 DIAGNOSIS — R278 Other lack of coordination: Secondary | ICD-10-CM | POA: Diagnosis not present

## 2021-11-06 DIAGNOSIS — E871 Hypo-osmolality and hyponatremia: Secondary | ICD-10-CM | POA: Diagnosis not present

## 2021-11-06 DIAGNOSIS — M6389 Disorders of muscle in diseases classified elsewhere, multiple sites: Secondary | ICD-10-CM | POA: Diagnosis not present

## 2021-11-06 LAB — COMPREHENSIVE METABOLIC PANEL
Calcium: 8.9 (ref 8.7–10.7)
eGFR: 72

## 2021-11-06 LAB — BASIC METABOLIC PANEL
BUN: 18 (ref 4–21)
CO2: 25 — AB (ref 13–22)
Chloride: 100 (ref 99–108)
Creatinine: 0.8 (ref 0.5–1.1)
Glucose: 89
Potassium: 4.4 mEq/L (ref 3.5–5.1)
Sodium: 133 — AB (ref 137–147)

## 2021-11-06 LAB — CBC AND DIFFERENTIAL
HCT: 29 — AB (ref 36–46)
Hemoglobin: 9.7 — AB (ref 12.0–16.0)
Platelets: 195 10*3/uL (ref 150–400)
WBC: 7.2

## 2021-11-06 LAB — CBC: RBC: 2.79 — AB (ref 3.87–5.11)

## 2021-11-07 ENCOUNTER — Encounter: Payer: Self-pay | Admitting: Internal Medicine

## 2021-11-07 ENCOUNTER — Inpatient Hospital Stay: Payer: Medicare Other

## 2021-11-07 ENCOUNTER — Other Ambulatory Visit: Payer: Self-pay | Admitting: Orthopedic Surgery

## 2021-11-07 DIAGNOSIS — R41841 Cognitive communication deficit: Secondary | ICD-10-CM | POA: Diagnosis not present

## 2021-11-07 DIAGNOSIS — R1313 Dysphagia, pharyngeal phase: Secondary | ICD-10-CM | POA: Diagnosis not present

## 2021-11-07 DIAGNOSIS — I4821 Permanent atrial fibrillation: Secondary | ICD-10-CM

## 2021-11-07 DIAGNOSIS — Z9181 History of falling: Secondary | ICD-10-CM | POA: Diagnosis not present

## 2021-11-07 DIAGNOSIS — M6389 Disorders of muscle in diseases classified elsewhere, multiple sites: Secondary | ICD-10-CM | POA: Diagnosis not present

## 2021-11-07 DIAGNOSIS — Z8673 Personal history of transient ischemic attack (TIA), and cerebral infarction without residual deficits: Secondary | ICD-10-CM | POA: Diagnosis not present

## 2021-11-07 DIAGNOSIS — R278 Other lack of coordination: Secondary | ICD-10-CM | POA: Diagnosis not present

## 2021-11-07 DIAGNOSIS — N390 Urinary tract infection, site not specified: Secondary | ICD-10-CM | POA: Diagnosis not present

## 2021-11-07 DIAGNOSIS — G3184 Mild cognitive impairment, so stated: Secondary | ICD-10-CM | POA: Diagnosis not present

## 2021-11-07 MED ORDER — APIXABAN 2.5 MG PO TABS: 2.5000 mg | ORAL_TABLET | Freq: Two times a day (BID) | ORAL | 0 refills | Status: DC

## 2021-11-07 NOTE — Progress Notes (Signed)
Hgb 9.7 (08/14). Hemoccults negative. Will restart Eliquis. Recheck cbc/diff 11/13/2021.

## 2021-11-08 DIAGNOSIS — R1313 Dysphagia, pharyngeal phase: Secondary | ICD-10-CM | POA: Diagnosis not present

## 2021-11-08 DIAGNOSIS — N390 Urinary tract infection, site not specified: Secondary | ICD-10-CM | POA: Diagnosis not present

## 2021-11-08 DIAGNOSIS — Z8673 Personal history of transient ischemic attack (TIA), and cerebral infarction without residual deficits: Secondary | ICD-10-CM | POA: Diagnosis not present

## 2021-11-08 DIAGNOSIS — Z9181 History of falling: Secondary | ICD-10-CM | POA: Diagnosis not present

## 2021-11-08 DIAGNOSIS — M6389 Disorders of muscle in diseases classified elsewhere, multiple sites: Secondary | ICD-10-CM | POA: Diagnosis not present

## 2021-11-08 DIAGNOSIS — R41841 Cognitive communication deficit: Secondary | ICD-10-CM | POA: Diagnosis not present

## 2021-11-08 DIAGNOSIS — G3184 Mild cognitive impairment, so stated: Secondary | ICD-10-CM | POA: Diagnosis not present

## 2021-11-08 DIAGNOSIS — R278 Other lack of coordination: Secondary | ICD-10-CM | POA: Diagnosis not present

## 2021-11-10 ENCOUNTER — Non-Acute Institutional Stay (SKILLED_NURSING_FACILITY): Payer: Medicare Other | Admitting: Adult Health

## 2021-11-10 ENCOUNTER — Encounter: Payer: Self-pay | Admitting: Adult Health

## 2021-11-10 DIAGNOSIS — E871 Hypo-osmolality and hyponatremia: Secondary | ICD-10-CM | POA: Diagnosis not present

## 2021-11-10 DIAGNOSIS — D509 Iron deficiency anemia, unspecified: Secondary | ICD-10-CM | POA: Diagnosis not present

## 2021-11-10 DIAGNOSIS — I10 Essential (primary) hypertension: Secondary | ICD-10-CM | POA: Diagnosis not present

## 2021-11-10 DIAGNOSIS — E039 Hypothyroidism, unspecified: Secondary | ICD-10-CM | POA: Diagnosis not present

## 2021-11-10 DIAGNOSIS — N3281 Overactive bladder: Secondary | ICD-10-CM | POA: Diagnosis not present

## 2021-11-10 DIAGNOSIS — G3184 Mild cognitive impairment, so stated: Secondary | ICD-10-CM | POA: Diagnosis not present

## 2021-11-10 DIAGNOSIS — N3000 Acute cystitis without hematuria: Secondary | ICD-10-CM | POA: Diagnosis not present

## 2021-11-10 DIAGNOSIS — I4821 Permanent atrial fibrillation: Secondary | ICD-10-CM | POA: Diagnosis not present

## 2021-11-10 DIAGNOSIS — M06049 Rheumatoid arthritis without rheumatoid factor, unspecified hand: Secondary | ICD-10-CM | POA: Diagnosis not present

## 2021-11-10 DIAGNOSIS — J849 Interstitial pulmonary disease, unspecified: Secondary | ICD-10-CM

## 2021-11-10 MED ORDER — GEMTESA 75 MG PO TABS: 32.5000 mg | ORAL_TABLET | Freq: Every day | ORAL | 5 refills | Status: DC

## 2021-11-10 NOTE — Progress Notes (Signed)
Location:    [facility] Room Number: 093 Place of Service:  SNF [phone])  Provider: Royal Hawthorn, NP  PCP: Virgie Dad, MD Patient Care Team: Virgie Dad, MD as PCP - General (Internal Medicine) Pixie Casino, MD as PCP - Cardiology (Cardiology) Pieter Partridge, DO as Consulting Physician (Neurology)  Extended Emergency Contact Information Primary Emergency Contact: [name],[name] Address: [address]          [place], [place] [address] Johnnette Litter of [place] Phone: [phone] Mobile Phone: [phone] Relation: Daughter Secondary Emergency Contact: Eula Listen Address: [address]          [place],  [address] Johnnette Litter of [place] Mobile Phone: [phone] Relation: Son  Code Status: DNR Goals of care:  Advanced Directive information    11/10/2021   10:40 AM  Advanced Directives  Does Patient Have a Medical Advance Directive? Yes  Type of Paramedic of [place];Living will  Does patient want to make changes to medical advance directive? No - Patient declined  Copy of [place] in Chart? Yes - validated most recent copy scanned in chart (See row information)     Allergies  Allergen Reactions   Codeine Other (See Comments)    "just don't take it well"   Gabapentin     dizziness   Molds & Smuts    Pollen Extract Swelling   Bee Venom Swelling and Rash    Chief Complaint  Patient presents with   Discharge Note    HPI:  88 y.o. female  seen for discharge from rehab at [facility], returning to assisted living.  PMH: hx of colon ca in 2006 with recurrence. Noted by colonoscopy 02/22/21, bx showed well differentiated adenocarcinoma. CT in Jan of 2023 showed no residual disease. Had 4 cycles of pembrolizumab. Colonoscopy 09/20/21 showed no residual mass. She is holding off on further treatment due to side effects.  PMH also significant for ILD, anemia, RA, memory loss,  HTN, afib, CVA, depression, constipation HLD She was sent to the ED on 10/27/21 due to increased confusion after a fall with concern for head bleed. Repeat CT of the head showed interval increase in hemorrhage at the scalp but no intracranial hemorrhage. She was found to have an Ecoli UTI and given Keflex. Also given saline and put on FR for hyponatremia. On 8/7 she was started on sodium tablets and iron was reduced to once daily. On 8/8 she was seen for low Hgb 9.8 on 8/8 from 11.6 8/4.  Hemoccults have been negative x 3. No further dizziness or bleeding is noted. Hematoma to scalp is resolving. Appetite is adequate. She is ambulating with her walker. Currently on NA tablets, NA 133 11/06/21.  She had some confusion and wandering during her stay in rehab but this has improved. She was taken off of Gemtesa by urology due to lack of benefit. There was also concern for retention. Now she is having incontinence at night and sleeping through the night in her brief.  SBP in the 130-160 range. No increased edema or sob. Has some DOE at baseline    Past Medical History:  Diagnosis Date   Allergy    seasonal   Anemia    Anxiety    Arthritis    Back   Atrial fibrillation ([place])    Cancer ([place]) 2006   Colon.  Basal Cell Skin cancer- right arm   Cataract    removed bilateral   Colon  cancer ([place])    Constipation due to pain medication therapy    after heart surgery   Dysrhythmia    PAF   GERD (gastroesophageal reflux disease)    Heart murmur    Hyperlipidemia    Hypertension    Hypothyroidism    RA (rheumatoid arthritis) ([place])    Restless leg    Seizures ([place])    after Heart Surgey   Stroke ([place])    TIA- found by neurologist after     Past Surgical History:  Procedure Laterality Date   ABDOMINAL HYSTERECTOMY  1970   Partial    COLON RESECTION  2006   cancer   COLON SURGERY     COLONOSCOPY     EYE SURGERY Bilateral    Cataract   MAXIMUM ACCESS (MAS)POSTERIOR LUMBAR INTERBODY FUSION  (PLIF) 2 LEVEL N/A 04/12/2015   Procedure: Lumbar Three-Five Decompression, Pedicle Screw Fixation, and Posteriolateral Arthrodesis;  Surgeon: Erline Levine, MD;  Location: [facility];  Service: Neurosurgery;  Laterality: N/A;  L3-4 L4-5 Maximum access posterior lumbar fusion, possible interbodies and resection of synovial cyst at L4-5   MITRAL VALVE REPAIR  01/20/2013   Gore-tex cords to P1, P2, and P3. Magic suture to posterior medial commisure, #30 Physio 1 ring. Done in [place]   TONSILLECTOMY     about [place]  01/20/2013   #28 TriAd ring done in [place]      reports that she has never smoked. She has been exposed to tobacco smoke. She has never used smokeless tobacco. She reports current alcohol use of about 1.0 standard drink of alcohol per week. She reports that she does not use drugs. Social History   Socioeconomic History   Marital status: Widowed    Spouse name: Not on file   Number of children: Not on file   Years of education: Not on file   Highest education level: Not on file  Occupational History   Occupation: elementary school teacher    Comment: retired   Tobacco Use   Smoking status: Never    Passive exposure: Past ("mother smoked")   Smokeless tobacco: Never  Vaping Use   Vaping Use: Never used  Substance and Sexual Activity   Alcohol use: Yes    Alcohol/week: 1.0 standard drink of alcohol    Types: 1 Glasses of wine per week    Comment: 1-2 per week   Drug use: No   Sexual activity: Not Currently  Other Topics Concern   Not on file  Social History Narrative   Social History      Diet? Healthy- low salt, sugar, fat      Do you drink/eat things with caffeine? On occasion      Marital status?        widow                        What year were you married? 1958      Do you live in a house, apartment, assisted living, condo, trailer, etc.? apartment      Is it one or more stories? one      How many persons live in your home? one       Do you have any pets in your home? (please list) no      Highest level of education completed? 4 year college      Current or past profession: Statistician      Do  you exercise?             yes                         Type & how often? Classes- senior retirement community/ some walking      Advanced Directives      Do you have a living will?      Do you have a DNR form?                                  If not, do you want to discuss one?      Do you have signed POA/HPOA for forms?       Functional Status Completed by: Daughter, Di Kindle      Do you have difficulty bathing or dressing yourself? no      Do you have difficulty preparing food or eating? no      Do you have difficulty managing your medications? no      Do you have difficulty managing your finances? no      Do you have difficulty affording your medications? no   Right handed   At [facility]   Social Determinants of Health   Financial Resource Strain: Low Risk  (01/28/2018)   Overall Financial Resource Strain (CARDIA)    Difficulty of Paying Living Expenses: Not hard at all  Food Insecurity: No Food Insecurity (01/28/2018)   Hunger Vital Sign    Worried About Running Out of Food in the Last Year: Never true    Ran Out of Food in the Last Year: Never true  Transportation Needs: No Transportation Needs (01/28/2018)   PRAPARE - Hydrologist (Medical): No    Lack of Transportation (Non-Medical): No  Physical Activity: Sufficiently Active (01/28/2018)   Exercise Vital Sign    Days of Exercise per Week: 6 days    Minutes of Exercise per Session: 30 min  Stress: No Stress Concern Present (01/28/2018)   [place]    Feeling of Stress : Only a little  Social Connections: Moderately Isolated (01/28/2018)   Social Connection and Isolation Panel [NHANES]    Frequency of Communication with Friends and Family:  More than three times a week    Frequency of Social Gatherings with Friends and Family: More than three times a week    Attends Religious Services: Never    Marine scientist or Organizations: No    Attends Archivist Meetings: Never    Marital Status: Widowed  Intimate Partner Violence: Not At Risk (01/28/2018)   Humiliation, Afraid, Rape, and Kick questionnaire    Fear of Current or Ex-Partner: No    Emotionally Abused: No    Physically Abused: No    Sexually Abused: No   Functional Status Survey:    Allergies  Allergen Reactions   Codeine Other (See Comments)    "just don't take it well"   Gabapentin     dizziness   Molds & Smuts    Pollen Extract Swelling   Bee Venom Swelling and Rash    Pertinent  Health Maintenance Due  Topic Date Due   INFLUENZA VACCINE  10/24/2021   DEXA SCAN  Completed    Medications: Allergies as of 11/10/2021       Reactions   Codeine Other (See Comments)   "  just don't take it well"   Gabapentin    dizziness   Molds & Smuts    Pollen Extract Swelling   Bee Venom Swelling, Rash        Medication List        Accurate as of November 10, 2021 10:49 AM. If you have any questions, ask your nurse or doctor.          STOP taking these medications    GEMTESA PO Stopped by: Royal Hawthorn, NP       TAKE these medications    amoxicillin 500 MG capsule Commonly known as: AMOXIL Take 500 mg by mouth. Before dental visits every 6 months   apixaban 2.5 MG Tabs tablet Commonly known as: Eliquis Take 1 tablet (2.5 mg total) by mouth 2 (two) times daily.   atorvastatin 20 MG tablet Commonly known as: LIPITOR TAKE 1 TABLET DAILY   cyanocobalamin 1000 MCG tablet Commonly known as: VITAMIN B12 Take 1,000 mcg by mouth daily.   digoxin 0.125 MG tablet Commonly known as: LANOXIN TAKE 1 TABLET DAILY   ferrous sulfate 325 (65 FE) MG tablet Take 325 mg by mouth 2 (two) times daily.   hydrocortisone cream 1 % Apply  1 Application topically daily.   leflunomide 20 MG tablet Commonly known as: ARAVA Take 20 mg by mouth daily.   levothyroxine 75 MCG tablet Commonly known as: SYNTHROID TAKE 1 TABLET DAILY 30 MINUTES BEFORE BREAKFAST ON AN EMPTY STOMACH   metoprolol succinate 50 MG 24 hr tablet Commonly known as: TOPROL-XL TAKE 1 TABLET DAILY   mirtazapine 15 MG tablet Commonly known as: REMERON TAKE 1 TABLET AT BEDTIME   MULTIPLE VITAMIN PO Take by mouth.   omeprazole 40 MG capsule Commonly known as: PRILOSEC TAKE 1 CAPSULE DAILY   PreserVision AREDS 2 Caps Take 1 Dose by mouth 2 (two) times daily. For eye health   sodium chloride 1 g tablet Take 1 g by mouth daily with breakfast.   valsartan 80 MG tablet Commonly known as: DIOVAN Take 0.5 tablets (40 mg total) by mouth daily.   Vitamin D 50 MCG (2000 UT) Caps Take 1 capsule by mouth daily.        Review of Systems  Constitutional:  Negative for activity change, appetite change, chills, diaphoresis, fatigue, fever and unexpected weight change.  HENT:  Negative for congestion.   Respiratory:  Negative for cough, shortness of breath and wheezing.   Cardiovascular:  Negative for chest pain, palpitations and leg swelling.  Gastrointestinal:  Negative for abdominal distention, abdominal pain, constipation and diarrhea.  Genitourinary:  Positive for frequency. Negative for difficulty urinating and dysuria.       Incontinence at night.   Musculoskeletal:  Positive for arthralgias and gait problem. Negative for back pain, joint swelling and myalgias.  Neurological:  Negative for dizziness, tremors, seizures, syncope, facial asymmetry, speech difficulty, weakness, light-headedness, numbness and headaches.  Psychiatric/Behavioral:  Positive for confusion. Negative for agitation and behavioral problems.     Vitals:   11/10/21 1010  BP: (!) 166/80  Pulse: 74  Resp: 14  Temp: 97.7 F (36.5 C)  SpO2: 96%  Weight: 117 lb (53.1 kg)   Height: '5\' 2"'$  (1.575 m)   Body mass index is 21.4 kg/m. Physical Exam Vitals and nursing note reviewed.  Constitutional:      General: She is not in acute distress.    Appearance: She is not diaphoretic.  HENT:     Head:  Comments: Right posterior scalp with hematoma, reduced size. Residual ecchymoses to the right side of the neck and chest resolving.  Neck:     Vascular: No JVD.  Cardiovascular:     Rate and Rhythm: Normal rate. Rhythm irregular.     Heart sounds: No murmur heard. Pulmonary:     Effort: Pulmonary effort is normal. No respiratory distress.     Breath sounds: Normal breath sounds. No wheezing.  Abdominal:     General: Bowel sounds are normal. There is no distension.     Palpations: Abdomen is soft.     Tenderness: There is no abdominal tenderness.  Musculoskeletal:     Right lower leg: No edema.     Left lower leg: No edema.     Comments: Arthritic changes to hands and feet.   Skin:    General: Skin is warm and dry.  Neurological:     General: No focal deficit present.     Mental Status: She is alert. Mental status is at baseline.  Psychiatric:        Mood and Affect: Mood normal.     Labs reviewed: Basic Metabolic Panel: Recent Labs    10/03/21 1055 10/22/21 0501 10/26/21 0000 10/27/21 1320 11/06/21 0000  NA 131* 130* 127* 126* 133*  K 4.6 3.9 4.2 4.5 4.4  CL 99 96* 92* 95* 100  CO2 21* 23 28* 21* 25*  GLUCOSE 113* 117*  --  103*  --   BUN '18 16 16 15 18  '$ CREATININE 0.79 0.74 0.7 0.71 0.8  CALCIUM 9.9 9.3 8.9 8.9 8.9   Liver Function Tests: Recent Labs    07/25/21 0945 08/22/21 1134 10/03/21 1055  AST '20 22 22  '$ ALT '18 20 18  '$ ALKPHOS 86 84 75  BILITOT 0.6 0.6 0.8  PROT 6.8 7.5 7.1  ALBUMIN 4.3 4.5 4.5   No results for input(s): "LIPASE", "AMYLASE" in the last 8760 hours. No results for input(s): "AMMONIA" in the last 8760 hours. CBC: Recent Labs    10/03/21 1055 10/22/21 0501 10/26/21 0000 10/27/21 1320 11/06/21 0000   WBC 8.0 11.0* 8.8 10.0 7.2  NEUTROABS 5.4 7.9*  --  7.4  --   HGB 11.3* 11.3* 11.2* 11.6* 9.7*  HCT 32.8* 33.3* 33* 34.6* 29*  MCV 97.0 100.3*  --  99.1  --   PLT 220 202 190 186 195   Cardiac Enzymes: No results for input(s): "CKTOTAL", "CKMB", "CKMBINDEX", "TROPONINI" in the last 8760 hours. BNP: Invalid input(s): "POCBNP" CBG: No results for input(s): "GLUCAP" in the last 8760 hours.  Procedures and Imaging Studies During Stay: CT Head Wo Contrast  Result Date: 10/27/2021 CLINICAL DATA:  Fall with recent fall and scalp hematoma. EXAM: CT HEAD WITHOUT CONTRAST TECHNIQUE: Contiguous axial images were obtained from the base of the skull through the vertex without intravenous contrast. RADIATION DOSE REDUCTION: This exam was performed according to the departmental dose-optimization program which includes automated exposure control, adjustment of the mA and/or kV according to patient size and/or use of iterative reconstruction technique. COMPARISON:  10/22/2021 FINDINGS: Brain: Stable advanced cortical atrophy and moderately severe periventricular small vessel disease. The brain demonstrates no evidence of acute hemorrhage, infarction, edema, mass effect, extra-axial fluid collection, hydrocephalus or mass lesion. Vascular: No hyperdense vessel or unexpected calcification. Skull: No acute fracture identified. Sinuses/Orbits: No acute finding. Other: Within the region right parietal scalp hematoma seen previously near the vertex, increased acute hemorrhage is identified in an ovoid configuration measuring up to  approximately 3 cm on the coronal reconstructions. No evidence of scalp soft tissue foreign body. IMPRESSION: Interval increased hemorrhage in the region recently imaged right parietal scalp hematoma with more prominent focal scalp hematoma present. No associated evidence of acute skull fracture. Stable appearance of brain without evidence of intracranial hemorrhage. Electronically Signed   By:  Aletta Edouard M.D.   On: 10/27/2021 13:44   CT Head Wo Contrast  Result Date: 10/22/2021 CLINICAL DATA:  Fall injury on blood thinners with right occipital head injury, neck pain. EXAM: CT HEAD WITHOUT CONTRAST CT CERVICAL SPINE WITHOUT CONTRAST TECHNIQUE: Multidetector CT imaging of the head and cervical spine was performed following the standard protocol without intravenous contrast. Multiplanar CT image reconstructions of the cervical spine were also generated. RADIATION DOSE REDUCTION: This exam was performed according to the departmental dose-optimization program which includes automated exposure control, adjustment of the mA and/or kV according to patient size and/or use of iterative reconstruction technique. COMPARISON:  Head CT and cervical spine CT both 09/06/2020. FINDINGS: CT HEAD FINDINGS Brain: There is moderately advanced cerebral atrophy, small-vessel disease and atrophic ventriculomegaly. Mild cerebellar atrophy. No acute infarct, hemorrhage or mass are seen. There is no midline shift. The basal cisterns are clear. Vascular: There are calcifications in the carotid siphons and distal left vertebral artery. There are no hyperdense central vessels. Skull: There is a large right parietal convexity scalp hematoma. The calvarium and skull base are intact. There is a chronic right orbital floor fracture which was acute on the previous exam. Sinuses/Orbits: Old lens replacements.  No acute abnormality. Other: None. CT CERVICAL SPINE FINDINGS Alignment: Chronic upper cervical kyphosis is noted with grade 1 anterolisthesis at C2-3 and C3-4, trace anterolisthesis C6-7 and C7-T1 and grade 1 retrolisthesis at C5-6, all unchanged and all consistent with degenerative listhesis. There is no new or traumatic alignment abnormality. Anterior atlantodental bone-on-bone joint space loss is again noted with osteophytes at the articular margins. Skull base and vertebrae: Osteopenia. No fracture is seen or primary  bone lesion. Right anterior bulky bridging osteophytes are again shown C4-5 and C5-6. Soft tissues and spinal canal: No prevertebral fluid or swelling. No visible canal hematoma. There is moderate calcification in the proximal cervical ICAs. Small atrophic thyroid gland. Disc levels: Facet joint ankylosis again noted on the right at C3-4. There is chronic disc collapse C4-5, C5-6, C6-7 and moderate disc space loss C3-4. There are bidirectional osteophytes from C3-4 through C6-7 with variable thecal sac encroachment, greatest at C4-5 and C5-6 where there is spondylotic cord compression and narrowing of the AP thecal sac diameter to as little as 5 mm. Facet joint and uncinate hypertrophy again results in multilevel foraminal stenosis worst on the right at C4-5 and C5-6. Upper chest: There is biapical pleural-parenchymal scarring and scattered calcifications. Other: None IMPRESSION: 1. Large right occipital convexity scalp hematoma. No acute intracranial CT findings or depressed skull fractures. 2. Chronic right orbital floor fracture. 3. Osteopenia and advanced degenerative changes of the cervical spine without evidence of fractures. See above for details. 4. No new cervical alignment abnormality with multilevel degenerative listhesis and prominent upper cervical kyphosis. 5. Carotid atherosclerosis. Electronically Signed   By: Telford Nab M.D.   On: 10/22/2021 06:35   CT Cervical Spine Wo Contrast  Result Date: 10/22/2021 CLINICAL DATA:  Fall injury on blood thinners with right occipital head injury, neck pain. EXAM: CT HEAD WITHOUT CONTRAST CT CERVICAL SPINE WITHOUT CONTRAST TECHNIQUE: Multidetector CT imaging of the head and cervical spine  was performed following the standard protocol without intravenous contrast. Multiplanar CT image reconstructions of the cervical spine were also generated. RADIATION DOSE REDUCTION: This exam was performed according to the departmental dose-optimization program which  includes automated exposure control, adjustment of the mA and/or kV according to patient size and/or use of iterative reconstruction technique. COMPARISON:  Head CT and cervical spine CT both 09/06/2020. FINDINGS: CT HEAD FINDINGS Brain: There is moderately advanced cerebral atrophy, small-vessel disease and atrophic ventriculomegaly. Mild cerebellar atrophy. No acute infarct, hemorrhage or mass are seen. There is no midline shift. The basal cisterns are clear. Vascular: There are calcifications in the carotid siphons and distal left vertebral artery. There are no hyperdense central vessels. Skull: There is a large right parietal convexity scalp hematoma. The calvarium and skull base are intact. There is a chronic right orbital floor fracture which was acute on the previous exam. Sinuses/Orbits: Old lens replacements.  No acute abnormality. Other: None. CT CERVICAL SPINE FINDINGS Alignment: Chronic upper cervical kyphosis is noted with grade 1 anterolisthesis at C2-3 and C3-4, trace anterolisthesis C6-7 and C7-T1 and grade 1 retrolisthesis at C5-6, all unchanged and all consistent with degenerative listhesis. There is no new or traumatic alignment abnormality. Anterior atlantodental bone-on-bone joint space loss is again noted with osteophytes at the articular margins. Skull base and vertebrae: Osteopenia. No fracture is seen or primary bone lesion. Right anterior bulky bridging osteophytes are again shown C4-5 and C5-6. Soft tissues and spinal canal: No prevertebral fluid or swelling. No visible canal hematoma. There is moderate calcification in the proximal cervical ICAs. Small atrophic thyroid gland. Disc levels: Facet joint ankylosis again noted on the right at C3-4. There is chronic disc collapse C4-5, C5-6, C6-7 and moderate disc space loss C3-4. There are bidirectional osteophytes from C3-4 through C6-7 with variable thecal sac encroachment, greatest at C4-5 and C5-6 where there is spondylotic cord  compression and narrowing of the AP thecal sac diameter to as little as 5 mm. Facet joint and uncinate hypertrophy again results in multilevel foraminal stenosis worst on the right at C4-5 and C5-6. Upper chest: There is biapical pleural-parenchymal scarring and scattered calcifications. Other: None IMPRESSION: 1. Large right occipital convexity scalp hematoma. No acute intracranial CT findings or depressed skull fractures. 2. Chronic right orbital floor fracture. 3. Osteopenia and advanced degenerative changes of the cervical spine without evidence of fractures. See above for details. 4. No new cervical alignment abnormality with multilevel degenerative listhesis and prominent upper cervical kyphosis. 5. Carotid atherosclerosis. Electronically Signed   By: Telford Nab M.D.   On: 10/22/2021 06:35    Assessment/Plan:   1. Iron deficiency anemia, unspecified iron deficiency anemia type Slight worsening of unclear etiology. Did have large scalp hematoma and received IVF in the ER (hemodilution). Will go back to BID iron and follow CBC   2. Overactive bladder Having night time incontinence issues, will try back 1/2 tab of Gemtesa - Vibegron (GEMTESA) 75 MG TABS; Take 32.5 mg by mouth at bedtime.  Dispense: 30 tablet; Refill: 5  3. Hyponatremia Continue sodium tabs, recheck two weeks  4. Acute cystitis without hematuria resolved  5. Mild cognitive impairment with memory loss Was worse during after a fall with concussion and scalp hematoma, low sodium and UTI. She is more mentally clear after these issues have improved   6. HTN (hypertension), benign Elevated today, possible due to sodium tablets Will f/u at clinic visit.   7. Permanent atrial fibrillation ([place]) Rate is controlled On eliquis  for CVA risk reduction   8. Interstitial lung disease ([place]) No current issues.   9. Acquired hypothyroidism Lab Results  Component Value Date   TSH 7.923 (H) 10/03/2021   Repeat tsh with next  labs  10. Rheumatoid arthritis involving hand with negative rheumatoid factor, unspecified laterality ([place]) Followed by rheumatology on [place] Enbrel on hold.    Patient is being discharged with the following home health services:  NA  Patient is being discharged with the following durable medical equipment:  has walker  Patient has been advised to f/u with their PCP in 1-2 weeks to bring them up to date on their rehab stay.  Social services at facility was responsible for arranging this appointment.  Pt was provided with a 30 day supply of prescriptions for medications and refills must be obtained from their PCP.  For controlled substances, a more limited supply may be provided adequate until PCP appointment only.  Future labs/tests needed:  CBC BMP TSH in two weeks    Discussed with her daughter Hosie Spangle May d/c back to [facility] and f/u apt 12/04/21

## 2021-11-13 DIAGNOSIS — E871 Hypo-osmolality and hyponatremia: Secondary | ICD-10-CM | POA: Diagnosis not present

## 2021-11-13 DIAGNOSIS — D509 Iron deficiency anemia, unspecified: Secondary | ICD-10-CM | POA: Diagnosis not present

## 2021-11-13 LAB — CBC AND DIFFERENTIAL
HCT: 34 — AB (ref 36–46)
Hemoglobin: 11.4 — AB (ref 12.0–16.0)
Platelets: 209 10*3/uL (ref 150–400)
WBC: 8.8

## 2021-11-14 DIAGNOSIS — N390 Urinary tract infection, site not specified: Secondary | ICD-10-CM | POA: Diagnosis not present

## 2021-11-14 DIAGNOSIS — R41841 Cognitive communication deficit: Secondary | ICD-10-CM | POA: Diagnosis not present

## 2021-11-14 DIAGNOSIS — M6389 Disorders of muscle in diseases classified elsewhere, multiple sites: Secondary | ICD-10-CM | POA: Diagnosis not present

## 2021-11-14 DIAGNOSIS — R278 Other lack of coordination: Secondary | ICD-10-CM | POA: Diagnosis not present

## 2021-11-14 DIAGNOSIS — R1313 Dysphagia, pharyngeal phase: Secondary | ICD-10-CM | POA: Diagnosis not present

## 2021-11-14 DIAGNOSIS — Z9181 History of falling: Secondary | ICD-10-CM | POA: Diagnosis not present

## 2021-11-14 DIAGNOSIS — Z8673 Personal history of transient ischemic attack (TIA), and cerebral infarction without residual deficits: Secondary | ICD-10-CM | POA: Diagnosis not present

## 2021-11-14 DIAGNOSIS — G3184 Mild cognitive impairment, so stated: Secondary | ICD-10-CM | POA: Diagnosis not present

## 2021-11-15 ENCOUNTER — Encounter: Payer: Self-pay | Admitting: Internal Medicine

## 2021-11-23 DIAGNOSIS — D509 Iron deficiency anemia, unspecified: Secondary | ICD-10-CM | POA: Diagnosis not present

## 2021-11-23 DIAGNOSIS — E871 Hypo-osmolality and hyponatremia: Secondary | ICD-10-CM | POA: Diagnosis not present

## 2021-11-23 LAB — CBC AND DIFFERENTIAL
HCT: 31 — AB (ref 36–46)
Hemoglobin: 10.4 — AB (ref 12.0–16.0)
Platelets: 183 10*3/uL (ref 150–400)
WBC: 5.8

## 2021-11-23 LAB — BASIC METABOLIC PANEL
BUN: 17 (ref 4–21)
CO2: 24 — AB (ref 13–22)
Chloride: 100 (ref 99–108)
Creatinine: 0.7 (ref 0.5–1.1)
Glucose: 91
Potassium: 4.4 mEq/L (ref 3.5–5.1)
Sodium: 135 — AB (ref 137–147)

## 2021-11-23 LAB — CBC: RBC: 2.98 — AB (ref 3.87–5.11)

## 2021-11-23 LAB — COMPREHENSIVE METABOLIC PANEL
Calcium: 8.9 (ref 8.7–10.7)
eGFR: 82

## 2021-11-23 LAB — TSH: TSH: 17.8 — AB (ref 0.41–5.90)

## 2021-11-24 ENCOUNTER — Telehealth: Payer: Self-pay | Admitting: Adult Health

## 2021-11-24 DIAGNOSIS — Z8673 Personal history of transient ischemic attack (TIA), and cerebral infarction without residual deficits: Secondary | ICD-10-CM | POA: Diagnosis not present

## 2021-11-24 DIAGNOSIS — R1313 Dysphagia, pharyngeal phase: Secondary | ICD-10-CM | POA: Diagnosis not present

## 2021-11-24 DIAGNOSIS — R41841 Cognitive communication deficit: Secondary | ICD-10-CM | POA: Diagnosis not present

## 2021-11-24 DIAGNOSIS — G3184 Mild cognitive impairment, so stated: Secondary | ICD-10-CM | POA: Diagnosis not present

## 2021-11-24 MED ORDER — LEVOTHYROXINE SODIUM 88 MCG PO TABS: ORAL_TABLET | ORAL | 0 refills | Status: DC

## 2021-11-24 NOTE — Telephone Encounter (Signed)
TSH returned at 17.8. Synthroid increased to 88 mcg. TSH recheck with CBC in 6 weeks. Hgb 10.4 down from 11.4 one month ago

## 2021-11-28 ENCOUNTER — Other Ambulatory Visit: Payer: Self-pay | Admitting: Oncology

## 2021-11-28 DIAGNOSIS — N390 Urinary tract infection, site not specified: Secondary | ICD-10-CM | POA: Diagnosis not present

## 2021-11-28 DIAGNOSIS — R278 Other lack of coordination: Secondary | ICD-10-CM | POA: Diagnosis not present

## 2021-11-28 DIAGNOSIS — M6389 Disorders of muscle in diseases classified elsewhere, multiple sites: Secondary | ICD-10-CM | POA: Diagnosis not present

## 2021-11-28 DIAGNOSIS — Z9181 History of falling: Secondary | ICD-10-CM | POA: Diagnosis not present

## 2021-11-29 DIAGNOSIS — R278 Other lack of coordination: Secondary | ICD-10-CM | POA: Diagnosis not present

## 2021-11-29 DIAGNOSIS — M6389 Disorders of muscle in diseases classified elsewhere, multiple sites: Secondary | ICD-10-CM | POA: Diagnosis not present

## 2021-11-29 DIAGNOSIS — N390 Urinary tract infection, site not specified: Secondary | ICD-10-CM | POA: Diagnosis not present

## 2021-11-29 DIAGNOSIS — Z9181 History of falling: Secondary | ICD-10-CM | POA: Diagnosis not present

## 2021-12-01 DIAGNOSIS — Z9181 History of falling: Secondary | ICD-10-CM | POA: Diagnosis not present

## 2021-12-01 DIAGNOSIS — N390 Urinary tract infection, site not specified: Secondary | ICD-10-CM | POA: Diagnosis not present

## 2021-12-01 DIAGNOSIS — M6389 Disorders of muscle in diseases classified elsewhere, multiple sites: Secondary | ICD-10-CM | POA: Diagnosis not present

## 2021-12-01 DIAGNOSIS — R278 Other lack of coordination: Secondary | ICD-10-CM | POA: Diagnosis not present

## 2021-12-04 ENCOUNTER — Encounter: Payer: Self-pay | Admitting: Adult Health

## 2021-12-04 ENCOUNTER — Non-Acute Institutional Stay: Payer: Medicare Other | Admitting: Adult Health

## 2021-12-04 VITALS — BP 134/82 | HR 84 | Temp 97.9°F | Ht 62.0 in | Wt 122.8 lb

## 2021-12-04 DIAGNOSIS — N3281 Overactive bladder: Secondary | ICD-10-CM | POA: Diagnosis not present

## 2021-12-04 DIAGNOSIS — I4821 Permanent atrial fibrillation: Secondary | ICD-10-CM

## 2021-12-04 DIAGNOSIS — F01A0 Vascular dementia, mild, without behavioral disturbance, psychotic disturbance, mood disturbance, and anxiety: Secondary | ICD-10-CM

## 2021-12-04 DIAGNOSIS — C185 Malignant neoplasm of splenic flexure: Secondary | ICD-10-CM

## 2021-12-04 DIAGNOSIS — S0003XD Contusion of scalp, subsequent encounter: Secondary | ICD-10-CM

## 2021-12-04 DIAGNOSIS — R278 Other lack of coordination: Secondary | ICD-10-CM | POA: Diagnosis not present

## 2021-12-04 DIAGNOSIS — D509 Iron deficiency anemia, unspecified: Secondary | ICD-10-CM | POA: Diagnosis not present

## 2021-12-04 DIAGNOSIS — M06049 Rheumatoid arthritis without rheumatoid factor, unspecified hand: Secondary | ICD-10-CM

## 2021-12-04 DIAGNOSIS — E871 Hypo-osmolality and hyponatremia: Secondary | ICD-10-CM | POA: Diagnosis not present

## 2021-12-04 DIAGNOSIS — Z9181 History of falling: Secondary | ICD-10-CM | POA: Diagnosis not present

## 2021-12-04 DIAGNOSIS — N390 Urinary tract infection, site not specified: Secondary | ICD-10-CM | POA: Diagnosis not present

## 2021-12-04 DIAGNOSIS — E039 Hypothyroidism, unspecified: Secondary | ICD-10-CM

## 2021-12-04 DIAGNOSIS — M6389 Disorders of muscle in diseases classified elsewhere, multiple sites: Secondary | ICD-10-CM | POA: Diagnosis not present

## 2021-12-04 NOTE — Progress Notes (Signed)
Location:  [name]  POS: Clinic  Provider: Royal Hawthorn, ANP  Code Status: DNR Goals of Care:     11/10/2021   10:40 AM  Advanced Directives  Does Patient Have a Medical Advance Directive? Yes  Type of Paramedic of [place];Living will  Does patient want to make changes to medical advance directive? No - Patient declined  Copy of [place] in Chart? Yes - validated most recent copy scanned in chart (See row information)     Chief Complaint  Patient presents with   Acute Visit    Follow up Discharge from Rehab. Discuss need for Covid and Flu Vaccine or postpone if patient refuses. NCIR Verified    HPI: Patient is a 88 y.o. female seen today for medical management of chronic diseases.    PMH: hx of colon ca in 2006 with recurrence. Noted by colonoscopy 02/22/21, bx showed well differentiated adenocarcinoma. CT in Jan of 2023 showed no residual disease. Had 4 cycles of pembrolizumab. Colonoscopy 09/20/21 showed no residual mass. She is holding off on further treatment due to side effects.  PMH also significant for ILD, anemia, RA, memory loss, HTN, afib, CVA, depression, constipation HLD  She is returning to the clinic after a rehab stay due to confusion, UTI, low sodium. She was seen in the ED on 10/27/21. She was treated for a UTI and low sodium with IVF. She is now doing better at baseline.  She fell and hit her head 10/22/21 and was sent to the ED. Had a large right occipital hematoma of the scalp. The area is not resolved and oozes on the bed at times. She is on eliquis for CVA risk reduction associated with afib. Repeat CT in the hospital 8/4 did now show head bleed.   Progressive memory loss: now in AL, doing well. Still ambulatory.  MMSE 20/30 10/30/21  Recovered from covid last month. Does not feel any residual symptoms. It was treated with paxlovid.   Reports urinary incontinence all day and wears a brief all the time now Has  intermitent stool incontinence. Started on 1/2 dose of Gemtesa due to incontinence at night. Nursing staff reports this is helping.   Reports a good appetite, has gained weight No abd pain or rectal bleeding.   Had a fall with some right arm pain afterwards, which resolved.   Hx of hyponatremia NA 135, on FR 1.5 liters per day and sodium tablets.   Synthroid increased to 88 mcg due to high TSH 11/24/21 Lab Results  Component Value Date   TSH 17.80 (A) 11/23/2021    Currently on iron bid, seems to be tolerating well. Lab Results  Component Value Date   HGB 10.4 (A) 11/23/2021     Past Medical History:  Diagnosis Date   Allergy    seasonal   Anemia    Anxiety    Arthritis    Back   Atrial fibrillation ([place])    Cancer ([place]) 2006   Colon.  Basal Cell Skin cancer- right arm   Cataract    removed bilateral   Colon cancer (HCC)    Constipation due to pain medication therapy    after heart surgery   Dysrhythmia    PAF   GERD (gastroesophageal reflux disease)    Heart murmur    Hyperlipidemia    Hypertension    Hypothyroidism    RA (rheumatoid arthritis) (HCC)    Restless leg    Seizures ([place])  after Heart Surgey   Stroke ([place])    TIA- found by neurologist after     Past Surgical History:  Procedure Laterality Date   ABDOMINAL HYSTERECTOMY  1970   Partial    COLON RESECTION  2006   cancer   COLON SURGERY     COLONOSCOPY     EYE SURGERY Bilateral    Cataract   MAXIMUM ACCESS (MAS)POSTERIOR LUMBAR INTERBODY FUSION (PLIF) 2 LEVEL N/A 04/12/2015   Procedure: Lumbar Three-Five Decompression, Pedicle Screw Fixation, and Posteriolateral Arthrodesis;  Surgeon: Erline Levine, MD;  Location: [place] [facility];  Service: Neurosurgery;  Laterality: N/A;  L3-4 L4-5 Maximum access posterior lumbar fusion, possible interbodies and resection of synovial cyst at L4-5   MITRAL VALVE REPAIR  01/20/2013   Gore-tex cords to P1, P2, and P3. Magic suture to posterior medial commisure, #30  Physio 1 ring. Done in [place]   TONSILLECTOMY     about [place]  01/20/2013   #28 TriAd ring done in [place]    Allergies  Allergen Reactions   Codeine Other (See Comments)    "just don't take it well"   Gabapentin     dizziness   Molds & Smuts    Pollen Extract Swelling   Bee Venom Swelling and Rash    Outpatient Encounter Medications as of 12/04/2021  Medication Sig   acetaminophen (TYLENOL) 325 MG tablet Take 650 mg by mouth every 4 (four) hours as needed.   amoxicillin (AMOXIL) 500 MG capsule Take 500 mg by mouth. Before dental visits every 6 months   apixaban (ELIQUIS) 2.5 MG TABS tablet Take 1 tablet (2.5 mg total) by mouth 2 (two) times daily.   atorvastatin (LIPITOR) 20 MG tablet TAKE 1 TABLET DAILY   Cholecalciferol (VITAMIN D) 50 MCG (2000 UT) CAPS Take 1 capsule by mouth daily.   digoxin (LANOXIN) 0.125 MG tablet TAKE 1 TABLET DAILY   ferrous sulfate 325 (65 FE) MG tablet Take 325 mg by mouth 2 (two) times daily.   hydrocortisone cream 1 % Apply 1 Application topically daily.   leflunomide (ARAVA) 20 MG tablet Take 20 mg by mouth daily.   levothyroxine (SYNTHROID) 88 MCG tablet TAKE 1 TABLET DAILY 30 MINUTES BEFORE BREAKFAST ON AN EMPTY STOMACHTAKE 1 TABLET DAILY 30 MINUTES BEFORE BREAKFAST ON AN EMPTY STOMACH   metoprolol succinate (TOPROL-XL) 50 MG 24 hr tablet TAKE 1 TABLET DAILY   mirtazapine (REMERON) 15 MG tablet TAKE 1 TABLET AT BEDTIME   MULTIPLE VITAMIN PO Take by mouth.   Multiple Vitamins-Minerals (PRESERVISION AREDS 2) CAPS Take 1 Dose by mouth 2 (two) times daily. For eye health   omeprazole (PRILOSEC) 40 MG capsule TAKE 1 CAPSULE DAILY   sodium chloride 1 g tablet Take 1 g by mouth daily with breakfast.   valsartan (DIOVAN) 80 MG tablet Take 0.5 tablets (40 mg total) by mouth daily.   Vibegron (GEMTESA) 75 MG TABS Take 32.5 mg by mouth at bedtime.   vitamin B-12 (CYANOCOBALAMIN) 1000 MCG tablet Take 1,000 mcg by mouth daily.     No facility-administered encounter medications on file as of 12/04/2021.    Review of Systems:  Review of Systems  Health Maintenance  Topic Date Due   COVID-19 Vaccine (5 - Moderna risk series) 03/03/2021   INFLUENZA VACCINE  10/24/2021   TETANUS/TDAP  04/16/2027   Pneumonia Vaccine 90+ Years old  Completed   DEXA SCAN  Completed   Zoster Vaccines- Shingrix  Completed  HPV VACCINES  Aged Out    Physical Exam: Vitals:   12/04/21 1459  BP: 134/82  Pulse: 84  Temp: 97.9 F (36.6 C)  SpO2: 97%  Weight: 122 lb 12.8 oz (55.7 kg)  Height: '5\' 2"'$  (1.575 m)   Body mass index is 22.46 kg/m.  Wt Readings from Last 3 Encounters:  12/04/21 122 lb 12.8 oz (55.7 kg)  11/10/21 117 lb (53.1 kg)  10/31/21 115 lb 9.6 oz (52.4 kg)    Physical Exam Vitals and nursing note reviewed.  Constitutional:      General: She is not in acute distress.    Appearance: She is not diaphoretic.  HENT:     Head:     Comments: Right occipital area with hematoma. Dried blood in hair. No open areas. Not tender.     Right Ear: Tympanic membrane and ear canal normal.     Left Ear: Tympanic membrane and ear canal normal.     Nose: Nose normal.     Mouth/Throat:     Mouth: Mucous membranes are moist.     Pharynx: Oropharynx is clear.  Eyes:     Conjunctiva/sclera: Conjunctivae normal.     Pupils: Pupils are equal, round, and reactive to light.  Neck:     Vascular: No JVD.  Cardiovascular:     Rate and Rhythm: Normal rate. Rhythm irregular.     Heart sounds: Murmur heard.  Pulmonary:     Effort: Pulmonary effort is normal. No respiratory distress.     Breath sounds: Normal breath sounds. No wheezing.  Abdominal:     General: Abdomen is flat. Bowel sounds are normal.     Palpations: Abdomen is soft.  Musculoskeletal:     Right lower leg: No edema.     Left lower leg: No edema.  Skin:    General: Skin is warm and dry.  Neurological:     General: No focal deficit present.     Mental  Status: She is alert. Mental status is at baseline.  Psychiatric:        Mood and Affect: Mood normal.     Labs reviewed: Basic Metabolic Panel: Recent Labs    07/25/21 0945 08/22/21 1134 10/03/21 1055 10/03/21 1056 10/22/21 0501 10/26/21 0000 10/27/21 1320 10/30/21 0900 11/06/21 0000 11/23/21 0000  NA 134*   < > 131*  --  130*   < > 126* 131* 133* 135*  K 4.2   < > 4.6  --  3.9   < > 4.5 4.6 4.4 4.4  CL 103   < > 99  --  96*   < > 95* 99 100 100  CO2 21*   < > 21*  --  23   < > 21* 21 25* 24*  GLUCOSE 105*   < > 113*  --  117*  --  103*  --   --   --   BUN 17   < > 18  --  16   < > '15 17 18 17  '$ CREATININE 0.74   < > 0.79  --  0.74   < > 0.71 0.7 0.8 0.7  CALCIUM 9.2   < > 9.9  --  9.3   < > 8.9 9.1 8.9 8.9  TSH 1.825  --   --  7.923*  --   --   --   --   --  17.80*   < > = values in this interval not displayed.  Liver Function Tests: Recent Labs    07/25/21 0945 08/22/21 1134 10/03/21 1055  AST '20 22 22  '$ ALT '18 20 18  '$ ALKPHOS 86 84 75  BILITOT 0.6 0.6 0.8  PROT 6.8 7.5 7.1  ALBUMIN 4.3 4.5 4.5   No results for input(s): "LIPASE", "AMYLASE" in the last 8760 hours. No results for input(s): "AMMONIA" in the last 8760 hours. CBC: Recent Labs    10/03/21 1055 10/22/21 0501 10/26/21 0000 10/27/21 1320 10/30/21 0900 11/06/21 0000 11/13/21 0000 11/23/21 0000  WBC 8.0 11.0*   < > 10.0   < > 7.2 8.8 5.8  NEUTROABS 5.4 7.9*  --  7.4  --   --   --   --   HGB 11.3* 11.3*   < > 11.6*   < > 9.7* 11.4* 10.4*  HCT 32.8* 33.3*   < > 34.6*   < > 29* 34* 31*  MCV 97.0 100.3*  --  99.1  --   --   --   --   PLT 220 202   < > 186   < > 195 209 183   < > = values in this interval not displayed.   Lipid Panel: Recent Labs    02/02/21 0000  CHOL 115  HDL 48  LDLCALC 49  TRIG 91   Lab Results  Component Value Date   HGBA1C 5.5 01/12/2013    Procedures since last visit: No results found.  Assessment/Plan  1. Iron deficiency anemia, unspecified iron deficiency  anemia type Continue iron bid Repeat cbc  2. Hyponatremia Improved Continue FR 1.5 liters Continue sodium tablets.   3. Acquired hypothyroidism Synthroid increased TSH due Oct  4. Major neurocognitive disorder, due to vascular disease, without behavioral disturbance, mild (HCC) Progressive decline in memory Now in AL and doing well  5. Overactive bladder Improved on gemtesa 1/2tab at night  6. Hematoma of scalp, subsequent encounter Consulted with Dr. Lyndel Safe and she consulted with dermatology. They do not recommend draining. I  7. Malignant neoplasm of splenic flexure ([place]) S/p chemo Colonoscopy 09/20/21 showed no residual mass.  Followed by oncology  8. Rheumatoid arthritis involving hand with negative rheumatoid factor, unspecified laterality ([place]) Followed by rheumatology   9. Permanent atrial fibrillation (HCC) On eliquis for CVA risk reduction  Rate control on digoxin  Labs/tests ordered:  * No order type specified * TSH and CBC due 10/13 Next appt: F/U with Dr Lyndel Safe in 3 months   Total time 3mn  time greater than 50% of total time spent doing pt counseling and coordination of care

## 2021-12-06 ENCOUNTER — Encounter (HOSPITAL_BASED_OUTPATIENT_CLINIC_OR_DEPARTMENT_OTHER): Payer: Self-pay

## 2021-12-06 ENCOUNTER — Observation Stay (HOSPITAL_BASED_OUTPATIENT_CLINIC_OR_DEPARTMENT_OTHER)
Admission: EM | Admit: 2021-12-06 | Discharge: 2021-12-07 | Disposition: A | Discharge status: Home Health | Payer: Medicare Other | Attending: Family Medicine | Admitting: Family Medicine

## 2021-12-06 ENCOUNTER — Other Ambulatory Visit: Payer: Self-pay

## 2021-12-06 ENCOUNTER — Emergency Department (HOSPITAL_BASED_OUTPATIENT_CLINIC_OR_DEPARTMENT_OTHER): Payer: Medicare Other

## 2021-12-06 DIAGNOSIS — Z7722 Contact with and (suspected) exposure to environmental tobacco smoke (acute) (chronic): Secondary | ICD-10-CM | POA: Diagnosis not present

## 2021-12-06 DIAGNOSIS — R296 Repeated falls: Secondary | ICD-10-CM

## 2021-12-06 DIAGNOSIS — K219 Gastro-esophageal reflux disease without esophagitis: Secondary | ICD-10-CM | POA: Diagnosis not present

## 2021-12-06 DIAGNOSIS — I10 Essential (primary) hypertension: Secondary | ICD-10-CM | POA: Diagnosis not present

## 2021-12-06 DIAGNOSIS — R58 Hemorrhage, not elsewhere classified: Secondary | ICD-10-CM | POA: Diagnosis not present

## 2021-12-06 DIAGNOSIS — M06049 Rheumatoid arthritis without rheumatoid factor, unspecified hand: Secondary | ICD-10-CM

## 2021-12-06 DIAGNOSIS — E039 Hypothyroidism, unspecified: Secondary | ICD-10-CM | POA: Diagnosis not present

## 2021-12-06 DIAGNOSIS — Z7901 Long term (current) use of anticoagulants: Secondary | ICD-10-CM | POA: Diagnosis not present

## 2021-12-06 DIAGNOSIS — M069 Rheumatoid arthritis, unspecified: Secondary | ICD-10-CM | POA: Diagnosis present

## 2021-12-06 DIAGNOSIS — Z85828 Personal history of other malignant neoplasm of skin: Secondary | ICD-10-CM | POA: Diagnosis not present

## 2021-12-06 DIAGNOSIS — D62 Acute posthemorrhagic anemia: Secondary | ICD-10-CM

## 2021-12-06 DIAGNOSIS — T148XXA Other injury of unspecified body region, initial encounter: Secondary | ICD-10-CM

## 2021-12-06 DIAGNOSIS — Z8673 Personal history of transient ischemic attack (TIA), and cerebral infarction without residual deficits: Secondary | ICD-10-CM | POA: Insufficient documentation

## 2021-12-06 DIAGNOSIS — W19XXXA Unspecified fall, initial encounter: Secondary | ICD-10-CM | POA: Insufficient documentation

## 2021-12-06 DIAGNOSIS — Z79899 Other long term (current) drug therapy: Secondary | ICD-10-CM | POA: Diagnosis not present

## 2021-12-06 DIAGNOSIS — I482 Chronic atrial fibrillation, unspecified: Secondary | ICD-10-CM | POA: Diagnosis not present

## 2021-12-06 DIAGNOSIS — I4821 Permanent atrial fibrillation: Secondary | ICD-10-CM

## 2021-12-06 DIAGNOSIS — R2689 Other abnormalities of gait and mobility: Secondary | ICD-10-CM | POA: Diagnosis not present

## 2021-12-06 DIAGNOSIS — M6281 Muscle weakness (generalized): Secondary | ICD-10-CM | POA: Insufficient documentation

## 2021-12-06 DIAGNOSIS — Z85038 Personal history of other malignant neoplasm of large intestine: Secondary | ICD-10-CM | POA: Insufficient documentation

## 2021-12-06 DIAGNOSIS — S0101XA Laceration without foreign body of scalp, initial encounter: Secondary | ICD-10-CM | POA: Diagnosis not present

## 2021-12-06 DIAGNOSIS — S0003XA Contusion of scalp, initial encounter: Secondary | ICD-10-CM | POA: Diagnosis not present

## 2021-12-06 DIAGNOSIS — I4891 Unspecified atrial fibrillation: Secondary | ICD-10-CM | POA: Diagnosis not present

## 2021-12-06 DIAGNOSIS — E871 Hypo-osmolality and hyponatremia: Secondary | ICD-10-CM | POA: Diagnosis present

## 2021-12-06 DIAGNOSIS — Z23 Encounter for immunization: Secondary | ICD-10-CM | POA: Insufficient documentation

## 2021-12-06 DIAGNOSIS — E782 Mixed hyperlipidemia: Secondary | ICD-10-CM | POA: Diagnosis not present

## 2021-12-06 DIAGNOSIS — S0003XD Contusion of scalp, subsequent encounter: Secondary | ICD-10-CM

## 2021-12-06 LAB — CBC WITH DIFFERENTIAL/PLATELET
Abs Immature Granulocytes: 0.03 10*3/uL (ref 0.00–0.07)
Basophils Absolute: 0.1 10*3/uL (ref 0.0–0.1)
Basophils Relative: 1 %
Eosinophils Absolute: 0.2 10*3/uL (ref 0.0–0.5)
Eosinophils Relative: 2 %
HCT: 25.3 % — ABNORMAL LOW (ref 36.0–46.0)
Hemoglobin: 8.5 g/dL — ABNORMAL LOW (ref 12.0–15.0)
Immature Granulocytes: 0 %
Lymphocytes Relative: 43 %
Lymphs Abs: 4.2 10*3/uL — ABNORMAL HIGH (ref 0.7–4.0)
MCH: 34 pg (ref 26.0–34.0)
MCHC: 33.6 g/dL (ref 30.0–36.0)
MCV: 101.2 fL — ABNORMAL HIGH (ref 80.0–100.0)
Monocytes Absolute: 1.3 10*3/uL — ABNORMAL HIGH (ref 0.1–1.0)
Monocytes Relative: 13 %
Neutro Abs: 4.1 10*3/uL (ref 1.7–7.7)
Neutrophils Relative %: 41 %
Platelets: 186 10*3/uL (ref 150–400)
RBC: 2.5 MIL/uL — ABNORMAL LOW (ref 3.87–5.11)
RDW: 15 % (ref 11.5–15.5)
WBC: 9.9 10*3/uL (ref 4.0–10.5)
nRBC: 0 % (ref 0.0–0.2)

## 2021-12-06 LAB — IRON AND TIBC
Iron: 73 ug/dL (ref 28–170)
Saturation Ratios: 30 % (ref 10.4–31.8)
TIBC: 248 ug/dL — ABNORMAL LOW (ref 250–450)
UIBC: 175 ug/dL

## 2021-12-06 LAB — FOLATE: Folate: 10.2 ng/mL (ref >5.9)

## 2021-12-06 LAB — BASIC METABOLIC PANEL
Anion gap: 10 (ref 5–15)
BUN: 21 mg/dL (ref 8–23)
CO2: 22 mmol/L (ref 22–32)
Calcium: 8.5 mg/dL — ABNORMAL LOW (ref 8.9–10.3)
Chloride: 100 mmol/L (ref 98–111)
Creatinine, Ser: 0.9 mg/dL (ref 0.44–1.00)
GFR, Estimated: >60 mL/min (ref >60)
Glucose, Bld: 157 mg/dL — ABNORMAL HIGH (ref 70–99)
Potassium: 3.5 mmol/L (ref 3.5–5.1)
Sodium: 132 mmol/L — ABNORMAL LOW (ref 135–145)

## 2021-12-06 LAB — CBC
HCT: 26.2 % — ABNORMAL LOW (ref 36.0–46.0)
Hemoglobin: 8.8 g/dL — ABNORMAL LOW (ref 12.0–15.0)
MCH: 34.1 pg — ABNORMAL HIGH (ref 26.0–34.0)
MCHC: 33.6 g/dL (ref 30.0–36.0)
MCV: 101.6 fL — ABNORMAL HIGH (ref 80.0–100.0)
Platelets: 136 10*3/uL — ABNORMAL LOW (ref 150–400)
RBC: 2.58 MIL/uL — ABNORMAL LOW (ref 3.87–5.11)
RDW: 16.5 % — ABNORMAL HIGH (ref 11.5–15.5)
WBC: 9.3 10*3/uL (ref 4.0–10.5)
nRBC: 0 % (ref 0.0–0.2)

## 2021-12-06 LAB — PROTIME-INR
INR: 1.2 (ref 0.8–1.2)
Prothrombin Time: 15.5 seconds — ABNORMAL HIGH (ref 11.4–15.2)

## 2021-12-06 LAB — PREPARE RBC (CROSSMATCH)

## 2021-12-06 LAB — HEMOGLOBIN AND HEMATOCRIT, BLOOD
HCT: 20.7 % — ABNORMAL LOW (ref 36.0–46.0)
HCT: 25.1 % — ABNORMAL LOW (ref 36.0–46.0)
Hemoglobin: 7 g/dL — ABNORMAL LOW (ref 12.0–15.0)
Hemoglobin: 8.2 g/dL — ABNORMAL LOW (ref 12.0–15.0)

## 2021-12-06 LAB — APTT: aPTT: 30 seconds (ref 24–36)

## 2021-12-06 LAB — VITAMIN B12: Vitamin B-12: 834 pg/mL (ref 180–914)

## 2021-12-06 MED ORDER — IRBESARTAN 75 MG PO TABS
75.0000 mg | ORAL_TABLET | Freq: Every day | ORAL | Status: DC
  Administered 2021-12-06 – 2021-12-07 (×2): 75 mg via ORAL
  Filled 2021-12-06 (×2): qty 1

## 2021-12-06 MED ORDER — ACETAMINOPHEN 325 MG PO TABS: 650.0000 mg | ORAL_TABLET | Freq: Four times a day (QID) | ORAL | Status: DC | PRN

## 2021-12-06 MED ORDER — ONDANSETRON HCL 4 MG/2ML IJ SOLN: 4.0000 mg | Freq: Four times a day (QID) | INTRAMUSCULAR | Status: DC | PRN

## 2021-12-06 MED ORDER — MIRTAZAPINE 15 MG PO TABS
15.0000 mg | ORAL_TABLET | Freq: Every day | ORAL | Status: DC
  Administered 2021-12-06: 15 mg via ORAL
  Filled 2021-12-06: qty 1

## 2021-12-06 MED ORDER — MIRABEGRON ER 25 MG PO TB24
25.0000 mg | ORAL_TABLET | Freq: Every day | ORAL | Status: DC
  Administered 2021-12-06 – 2021-12-07 (×2): 25 mg via ORAL
  Filled 2021-12-06 (×2): qty 1

## 2021-12-06 MED ORDER — INFLUENZA VAC A&B SA ADJ QUAD 0.5 ML IM PRSY
0.5000 mL | PREFILLED_SYRINGE | INTRAMUSCULAR | Status: AC
  Administered 2021-12-07: 0.5 mL via INTRAMUSCULAR
  Filled 2021-12-06: qty 0.5

## 2021-12-06 MED ORDER — PANTOPRAZOLE SODIUM 40 MG PO TBEC
40.0000 mg | DELAYED_RELEASE_TABLET | Freq: Every day | ORAL | Status: DC
  Administered 2021-12-06 – 2021-12-07 (×2): 40 mg via ORAL
  Filled 2021-12-06 (×2): qty 1

## 2021-12-06 MED ORDER — LEFLUNOMIDE 20 MG PO TABS
20.0000 mg | ORAL_TABLET | Freq: Every day | ORAL | Status: DC
  Administered 2021-12-06 – 2021-12-07 (×2): 20 mg via ORAL
  Filled 2021-12-06 (×2): qty 1

## 2021-12-06 MED ORDER — DIGOXIN 125 MCG PO TABS
125.0000 ug | ORAL_TABLET | Freq: Every day | ORAL | Status: DC
  Administered 2021-12-06 – 2021-12-07 (×2): 125 ug via ORAL
  Filled 2021-12-06 (×2): qty 1

## 2021-12-06 MED ORDER — LACTATED RINGERS IV SOLN: INTRAVENOUS | Status: DC

## 2021-12-06 MED ORDER — SODIUM CHLORIDE 0.9 % IV BOLUS
1000.0000 mL | Freq: Once | INTRAVENOUS | Status: AC
  Administered 2021-12-06: 1000 mL via INTRAVENOUS

## 2021-12-06 MED ORDER — LIDOCAINE-EPINEPHRINE (PF) 2 %-1:200000 IJ SOLN
20.0000 mL | Freq: Once | INTRAMUSCULAR | Status: AC
  Administered 2021-12-06: 20 mL

## 2021-12-06 MED ORDER — ATORVASTATIN CALCIUM 10 MG PO TABS
20.0000 mg | ORAL_TABLET | Freq: Every day | ORAL | Status: DC
  Administered 2021-12-06 – 2021-12-07 (×2): 20 mg via ORAL
  Filled 2021-12-06 (×2): qty 2

## 2021-12-06 MED ORDER — SODIUM CHLORIDE 1 G PO TABS
1.0000 g | ORAL_TABLET | Freq: Every day | ORAL | Status: DC
  Administered 2021-12-06 – 2021-12-07 (×2): 1 g via ORAL
  Filled 2021-12-06 (×2): qty 1

## 2021-12-06 MED ORDER — POLYETHYLENE GLYCOL 3350 17 G PO PACK: 17.0000 g | PACK | Freq: Every day | ORAL | Status: DC | PRN

## 2021-12-06 MED ORDER — LIDOCAINE HCL 2 % IJ SOLN: 20.0000 mL | Freq: Once | INTRAMUSCULAR | Status: DC

## 2021-12-06 MED ORDER — BIOTENE DRY MOUTH MT LIQD: 15.0000 mL | OROMUCOSAL | Status: DC | PRN

## 2021-12-06 MED ORDER — SODIUM CHLORIDE 0.9 % IV SOLN: 10.0000 mL/h | Freq: Once | INTRAVENOUS | Status: DC

## 2021-12-06 MED ORDER — METOPROLOL SUCCINATE ER 50 MG PO TB24
50.0000 mg | ORAL_TABLET | Freq: Every day | ORAL | Status: DC
  Administered 2021-12-06 – 2021-12-07 (×2): 50 mg via ORAL
  Filled 2021-12-06: qty 1
  Filled 2021-12-06: qty 2

## 2021-12-06 MED ORDER — ACETAMINOPHEN 650 MG RE SUPP: 650.0000 mg | Freq: Four times a day (QID) | RECTAL | Status: DC | PRN

## 2021-12-06 MED ORDER — FERROUS SULFATE 325 (65 FE) MG PO TABS
325.0000 mg | ORAL_TABLET | Freq: Two times a day (BID) | ORAL | Status: DC
  Administered 2021-12-06 – 2021-12-07 (×3): 325 mg via ORAL
  Filled 2021-12-06 (×3): qty 1

## 2021-12-06 MED ORDER — ONDANSETRON HCL 4 MG PO TABS: 4.0000 mg | ORAL_TABLET | Freq: Four times a day (QID) | ORAL | Status: DC | PRN

## 2021-12-06 MED ORDER — LEVOTHYROXINE SODIUM 88 MCG PO TABS
88.0000 ug | ORAL_TABLET | Freq: Every day | ORAL | Status: DC
  Administered 2021-12-06 – 2021-12-07 (×2): 88 ug via ORAL
  Filled 2021-12-06 (×2): qty 1

## 2021-12-06 NOTE — Assessment & Plan Note (Signed)
   We will cautiously resume home antihypertensive therapy with parameters for administration

## 2021-12-06 NOTE — H&P (Signed)
History and Physical    Patient: Kathy Velez MRN: [id number] DOA: 12/06/2021  Date of Service: the patient was seen and examined on 12/06/2021  Patient coming from: Home via [place]  Chief Complaint:  Chief Complaint  Patient presents with   Fall   hematoma    HPI:   88 year old female with past medical history of atrial fibrillation on Eliquis, hypothyroidism, gastroesophageal reflux disease, hypertension, rheumatoid arthritis, history of mitral valve and tricuspid repair, adenocarcinoma of the colon (Dx 2006 S/P right colectomy with recurrence 01/2021 S/P Pembrolizumab cycles x 4), prior TIA and CVA, rheumatoid arthritis who presents to [facility] emergency department with bleeding from her scalp.  Patient originally presented to [facility] emergency department on 8/4 from [facility] status post fall and transient confusion.  During that work-up in the emergency department patient was found to have a sodium of 126.  Patient was noted to have a scalp hematoma.  Patient was noted to not have any neurologic deficit and it was felt that the patient's sodium of 126 was not significantly different from her baseline of 130-133.  Patient was additionally found to have a urinary tract infection and was discharged on a course of oral Keflex.  Eliquis was not discontinued.  Patient completed her course of antibiotic therapy as instructed.  The scalp injury/hematoma persisted and eventually developed an overlying scab that intermittently would bleed over the course of the next 5 weeks that was for the most part small-volume.    Early in the morning on 9/13 patient woke up in bed lying in a puddle of blood on top of her pillow.  Patient denies recent fall or other injury to the scalp.  Patient denies increasing headache, focal weakness or other new neurologic deficit.  EMS was contacted and upon arrival a pressure dressing was placed and the patient was promptly  brought into [place] emergency department.  Upon evaluation of the [facility] EDP applied stitches to the area achieving hemostasis.  Patient became acutely hypotensive with systolic blood pressures as low as 70s.  1 L of isotonic fluid was administered.  Hemoglobin was noted to be 8.5 with follow-up hemoglobin found to be 7.0.  Patient was transferred ED to ED to [facility] due to expected need for blood transfusion.  Upon arrival to [facility] emergency department patient is noted to be hemodynamically stable with no active bleeding.  The hospitalist group has now been called to assess the patient for admission to the hospital.   Review of Systems: Review of Systems  HENT:         Bleeding scalp  Musculoskeletal:  Positive for falls.  All other systems reviewed and are negative.    Past Medical History:  Diagnosis Date   Allergy    seasonal   Anemia    Anxiety    Arthritis    Back   Atrial fibrillation ([place])    Cancer ([place]) 2006   Colon.  Basal Cell Skin cancer- right arm   Cataract    removed bilateral   Colon cancer (HCC)    Constipation due to pain medication therapy    after heart surgery   Dysrhythmia    PAF   GERD (gastroesophageal reflux disease)    Heart murmur    Hyperlipidemia    Hypertension    Hypothyroidism    RA (rheumatoid arthritis) (HCC)    Restless leg    Seizures ([place])    after  Heart Surgey   Stroke [facility])    TIA- found by neurologist after     Past Surgical History:  Procedure Laterality Date   ABDOMINAL HYSTERECTOMY  1970   Partial    COLON RESECTION  2006   cancer   COLON SURGERY     COLONOSCOPY     EYE SURGERY Bilateral    Cataract   MAXIMUM ACCESS (MAS)POSTERIOR LUMBAR INTERBODY FUSION (PLIF) 2 LEVEL N/A 04/12/2015   Procedure: Lumbar Three-Five Decompression, Pedicle Screw Fixation, and Posteriolateral Arthrodesis;  Surgeon: Erline Levine, MD;  Location: [place] [facility];  Service: Neurosurgery;  Laterality: N/A;   L3-4 L4-5 Maximum access posterior lumbar fusion, possible interbodies and resection of synovial cyst at L4-5   MITRAL VALVE REPAIR  01/20/2013   Gore-tex cords to P1, P2, and P3. Magic suture to posterior medial commisure, #30 Physio 1 ring. Done in [place]   TONSILLECTOMY     about [place]  01/20/2013   #28 TriAd ring done in [place]    Social History:  reports that she has never smoked. She has been exposed to tobacco smoke. She has never used smokeless tobacco. She reports current alcohol use of about 1.0 standard drink of alcohol per week. She reports that she does not use drugs.  Allergies  Allergen Reactions   Codeine Other (See Comments)    "just don't take it well"   Gabapentin Other (See Comments)    Dizziness    Molds & Smuts    Pollen Extract Swelling   Bee Venom Swelling and Rash    Family History  Problem Relation Age of Onset   Lung disease Mother 75   Heart disease Father 23   Colon polyps Daughter    Alcohol abuse Son    Colon cancer Neg Hx    Stomach cancer Neg Hx    Rectal cancer Neg Hx    Crohn's disease Neg Hx    Esophageal cancer Neg Hx     Prior to Admission medications   Medication Sig Start Date End Date Taking? Authorizing Provider  acetaminophen (TYLENOL) 325 MG tablet Take 650 mg by mouth every 4 (four) hours as needed.    [provider]  amoxicillin (AMOXIL) 500 MG capsule Take 500 mg by mouth. Before dental visits every 6 months    [provider]  apixaban (ELIQUIS) 2.5 MG TABS tablet Take 1 tablet (2.5 mg total) by mouth 2 (two) times daily. 11/07/21   [place], Amy E, NP  atorvastatin (LIPITOR) 20 MG tablet TAKE 1 TABLET DAILY 06/14/21   Virgie Dad, MD  Cholecalciferol (VITAMIN D) 50 MCG (2000 UT) CAPS Take 1 capsule by mouth daily.    [provider]  digoxin (LANOXIN) 0.125 MG tablet TAKE 1 TABLET DAILY 06/14/21   Virgie Dad, MD  ferrous sulfate 325 (65 FE) MG tablet Take 325 mg by  mouth 2 (two) times daily.    [provider]  hydrocortisone cream 1 % Apply 1 Application topically daily.    [provider]  leflunomide (ARAVA) 20 MG tablet Take 20 mg by mouth daily. 10/05/19   [provider]  levothyroxine (SYNTHROID) 88 MCG tablet TAKE 1 TABLET DAILY 30 MINUTES BEFORE BREAKFAST ON AN EMPTY STOMACHTAKE 1 TABLET DAILY 30 MINUTES BEFORE BREAKFAST ON AN EMPTY STOMACH 11/24/21   Royal Hawthorn, NP  metoprolol succinate (TOPROL-XL) 50 MG 24 hr tablet TAKE 1 TABLET DAILY 06/14/21   Virgie Dad, MD  mirtazapine (REMERON) 15 MG tablet TAKE 1 TABLET AT BEDTIME 02/20/21   Virgie Dad, MD  MULTIPLE VITAMIN PO Take by mouth.    [provider]  Multiple Vitamins-Minerals (PRESERVISION AREDS 2) CAPS Take 1 Dose by mouth 2 (two) times daily. For eye health    [provider]  omeprazole (PRILOSEC) 40 MG capsule TAKE 1 CAPSULE DAILY 10/17/21   Virgie Dad, MD  sodium chloride 1 g tablet Take 1 g by mouth daily with breakfast.    [provider]  valsartan (DIOVAN) 80 MG tablet Take 0.5 tablets (40 mg total) by mouth daily. 10/26/21   Royal Hawthorn, NP  Vibegron (GEMTESA) 75 MG TABS Take 32.5 mg by mouth at bedtime. 11/10/21   Royal Hawthorn, NP  vitamin B-12 (CYANOCOBALAMIN) 1000 MCG tablet Take 1,000 mcg by mouth daily.     [provider]    Physical Exam:  Vitals:   12/06/21 0630 12/06/21 0700 12/06/21 0715 12/06/21 0749  BP: 128/76  112/87   Pulse: 96 95 98   Resp: 20 (!) 24 15   Temp: (!) 97.3 F (36.3 C) (!) 97.1 F (36.2 C)  98.2 F (36.8 C)  TempSrc: Oral Oral  Oral  SpO2: 97% 97% 96%   Weight:      Height:        Constitutional: Lethargic but arousable, oriented x3, no associated distress.   Skin: Dressing over the scalp is clean dry and intact.  Somewhat poor skin turgor noted.  Increased skin pallor Eyes: Pupils are equally reactive to light.  No evidence of scleral icterus or conjunctival  pallor.  ENMT: Extreme dry mucous membranes noted.  Posterior pharynx clear of any exudate or lesions.   Neck: normal, supple, no masses, no thyromegaly.  No evidence of jugular venous distension.   Respiratory: clear to auscultation bilaterally, no wheezing, no crackles. Normal respiratory effort. No accessory muscle use.  Cardiovascular: Controlled rate with irregularly irregular rhythm.  No murmurs / rubs / gallops. No extremity edema. 2+ pedal pulses. No carotid bruits.  Chest:   Nontender without crepitus or deformity.   Back:   Nontender without crepitus or deformity. Abdomen: Abdomen is soft and nontender.  No evidence of intra-abdominal masses.  Positive bowel sounds noted in all quadrants.   Musculoskeletal: Multiple deformities of the joints of the fingers consistent with rheumatoid arthritis.   Good ROM, no contractures. Normal muscle tone.  Neurologic: CN 2-12 grossly intact. Sensation intact.  Patient moving all 4 extremities spontaneously.  Patient is following all commands.  Patient is responsive to verbal stimuli.   Psychiatric: Patient exhibits normal mood with appropriate affect.  Patient seems to possess insight as to their current situation.    Data Reviewed:  I have personally reviewed and interpreted labs, imaging.  Significant findings are   Lab Results  Component Value Date   WBC 9.9 12/06/2021   HGB 8.2 (L) 12/06/2021   HCT 25.1 (L) 12/06/2021   MCV 101.2 (H) 12/06/2021   PLT 186 12/06/2021   Lab Results  Component Value Date   K 3.5 12/06/2021   Lab Results  Component Value Date   BUN 21 12/06/2021   Lab Results  Component Value Date   CREATININE 0.90 12/06/2021    CT Head:    Small extracranial hematoma overlying the right parietal bone without any evidence of calvarial fracture.    EKG: Personally reviewed.  Rhythm is atrial fibrillation with heart rate of 95 bpm.  No  dynamic ST segment changes appreciated.   Assessment and Plan: * Scalp  hematoma Patient developed chronic scalp hematoma since a fall approximately 5 weeks ago Since then patient has been experiencing intermittent bleeding from scalp, culminating in a rather large bleed earlier this morning Hemoglobin on arrival to [place] emergency department was 8.5, down from 11.4 three weeks prior Bleeding vessels were tied off of the [facility] emergency department and hemostasis was achieved.  Hemoglobin seems to have dropped further to 7.0  with some associated hypotension prompting an ED to ED transfer to [place] is already ordered a 1 unit blood transfusion upon arrival to [facility]  We will additionally provide patient with continued gentle intravenous hydration with crystalloid Holding Eliquis Expect now that hemostasis is achieved hemoglobin and hematocrit remain stable We will obtain an additional CBC in several hours Additionally obtaining coagulation profile, folate, vitamin B12 and iron panel Wound care will be asked to come reevaluate the wound and change the dressing  Chronic atrial fibrillation ([place]) Considering patient's advanced age, reported frequent falls and this large scalp hematoma the risk of continuing Eliquis seems to outweigh the benefit Holding Eliquis for now.  Patient has a relatively high CHA2DS2-VASc score of 6 as well as history of stroke/TIA (I was only able to locate an outside hospital admission for TIA in care everywhere in 2021) and therefore curbsiding and cardiology may prove useful in deciding long-term plan for anticoagulation for this patient.  We will continue rate controlling medication of metoprolol with parameters for administration Monitoring patient on telemetry, currently rate controlled  Essential hypertension We will cautiously resume home antihypertensive therapy with parameters for administration  Frequent falls Reported frequent falls Holding Eliquis for now.  Patient has a relatively high CHA2DS2-VASc score of 6  as well as history of stroke/TIA and therefore curbsiding and cardiology may prove useful in deciding long-term plan for anticoagulation for this patient.  Obtaining PT evaluation Fall precautions  Rheumatoid arthritis ([place]) Continue home regimen of leflunomide  Hypothyroidism Resume home regimen of Synthroid    Mixed hyperlipidemia Continuing home regimen of lipid lowering therapy.   GERD without esophagitis Continuing home regimen of daily PPI therapy.   Hyponatremia Longstanding history of mild hyponatremia, presumably SIADH although this is not noted in the patient's chart Baseline is typically in the low 130s We will continue daily salt tablets per home regimen       Code Status:  DNR  code status decision has been confirmed with: patient Family Communication: Daughter has been updated on plan of care by EDP  Consults: We will consider having a discussion with cardiology concerning anticoagulation going forward  Severity of Illness:  The appropriate patient status for this patient is OBSERVATION. Observation status is judged to be reasonable and necessary in order to provide the required intensity of service to ensure the patient's safety. The patient's presenting symptoms, physical exam findings, and initial radiographic and laboratory data in the context of their medical condition is felt to place them at decreased risk for further clinical deterioration. Furthermore, it is anticipated that the patient will be medically stable for discharge from the hospital within 2 midnights of admission.   Author:  Vernelle Emerald MD  12/06/2021 7:55 AM

## 2021-12-06 NOTE — Assessment & Plan Note (Signed)
   Longstanding history of mild hyponatremia, presumably SIADH although this is not noted in the patient's chart  Baseline is typically in the low 130s  We will continue daily salt tablets per home regimen

## 2021-12-06 NOTE — Evaluation (Signed)
Physical Therapy Evaluation Patient Details Name: Kathy Velez MRN: [id number] DOB: 07-May-1931 Today's Date: 12/06/2021  History of Present Illness  88 year old female presents to [facility] emergency department with bleeding from her scalp (fall on 8/4 with scalp hematoma that kept intermittently bleeding then on day of admission extensive bleeding) . PHMx: atrial fibrillation, hypothyroidism, gastroesophageal reflux disease, hypertension, rheumatoid arthritis, mitral valve and tricuspid repair, adenocarcinoma of the colon (2006 S/P right colectomy with recurrence 01/2021 S/P Pembrolizumab cycles x 4), prior TIA and CVA, rheumatoid arthritis.  Clinical Impression  Pt admitted secondary to problem above with deficits below. Pt requiring Min to min guard A for mobility tasks. Pt with mild unsteadiness throughout. Pt currently from ALF. Recommending HHPT if ALF can provide increased assist initially. However, if ALF unable to provide support, recommend SNF. Will continue to follow acutely.      Recommendations for follow up therapy are one component of a multi-disciplinary discharge planning process, led by the attending physician.  Recommendations may be updated based on patient status, additional functional criteria and insurance authorization.  Follow Up Recommendations Home health PT (If ALF can provide necessary assist, if not will need ST SNF)      Assistance Recommended at Discharge Frequent or constant Supervision/Assistance  Patient can return home with the following  A little help with walking and/or transfers;A little help with bathing/dressing/bathroom;Assistance with cooking/housework;Assist for transportation;Help with stairs or ramp for entrance    Equipment Recommendations None recommended by PT  Recommendations for Other Services       Functional Status Assessment Patient has had a recent decline in their functional status and demonstrates the ability to make significant  improvements in function in a reasonable and predictable amount of time.     Precautions / Restrictions Precautions Precautions: Fall Restrictions Weight Bearing Restrictions: No      Mobility  Bed Mobility Overal bed mobility: Needs Assistance Bed Mobility: Supine to Sit, Sit to Supine     Supine to sit: Min assist Sit to supine: Min guard   General bed mobility comments: Min A for trunk elevation    Transfers Overall transfer level: Needs assistance Equipment used: 2 person hand held assist Transfers: Sit to/from Stand Sit to Stand: Min assist, +2 safety/equipment           General transfer comment: min guard A if from regular bed height (from stretcher +2 min A and Bil HHA)    Ambulation/Gait Ambulation/Gait assistance: Min guard, +2 safety/equipment, Min assist Gait Distance (Feet): 15 Feet Assistive device: 2 person hand held assist Gait Pattern/deviations: Step-through pattern, Decreased stride length Gait velocity: Decreased     General Gait Details: Slow, mildly unsteady gait. Min guard to [place] A for steadying. Mild lightheadedness reported throughout.  Stairs            Wheelchair Mobility    Modified Rankin (Stroke Patients Only)       Balance Overall balance assessment: Needs assistance Sitting-balance support: No upper extremity supported, Feet supported Sitting balance-Leahy Scale: Good     Standing balance support: No upper extremity supported, Bilateral upper extremity supported Standing balance-Leahy Scale: Fair Standing balance comment: standing at sink to wash hands, if amublating Bil UE support                             Pertinent Vitals/Pain Pain Assessment Pain Assessment: No/denies pain    Home Living Family/patient expects to be discharged to:: Assisted living  Home Equipment: Rollator (4 wheels) Additional Comments: [facility]    Prior Function Prior Level of Function :  Independent/Modified Independent             Mobility Comments: Uses rollator for ambulation ADLs Comments: Pt reports she is able to bathe and dress herself.     Hand Dominance        Extremity/Trunk Assessment   Upper Extremity Assessment Upper Extremity Assessment: Defer to OT evaluation    Lower Extremity Assessment Lower Extremity Assessment: Generalized weakness    Cervical / Trunk Assessment Cervical / Trunk Assessment: Kyphotic  Communication   Communication: HOH  Cognition Arousal/Alertness: Awake/alert Behavior During Therapy: WFL for tasks assessed/performed Overall Cognitive Status: No family/caregiver present to determine baseline cognitive functioning                                 General Comments: Slow processing noted        General Comments General comments (skin integrity, edema, etc.): VSS    Exercises     Assessment/Plan    PT Assessment Patient needs continued PT services  PT Problem List Decreased strength;Decreased activity tolerance;Decreased balance;Decreased mobility;Decreased cognition;Decreased knowledge of use of DME;Decreased safety awareness;Decreased knowledge of precautions       PT Treatment Interventions DME instruction;Gait training;Functional mobility training;Therapeutic activities;Balance training;Therapeutic exercise;Patient/family education;Cognitive remediation    PT Goals (Current goals can be found in the Care Plan section)  Acute Rehab PT Goals Patient Stated Goal: to go home PT Goal Formulation: With patient Time For Goal Achievement: 12/20/21 Potential to Achieve Goals: Good    Frequency Min 3X/week     Co-evaluation PT/OT/SLP Co-Evaluation/Treatment: Yes Reason for Co-Treatment: For patient/therapist safety;To address functional/ADL transfers PT goals addressed during session: Mobility/safety with mobility OT goals addressed during session: Strengthening/ROM;ADL's and self-care        AM-PAC PT "6 Clicks" Mobility  Outcome Measure Help needed turning from your back to your side while in a flat bed without using bedrails?: A Little Help needed moving from lying on your back to sitting on the side of a flat bed without using bedrails?: A Little Help needed moving to and from a bed to a chair (including a wheelchair)?: A Little Help needed standing up from a chair using your arms (e.g., wheelchair or bedside chair)?: A Little Help needed to walk in hospital room?: A Little Help needed climbing 3-5 steps with a railing? : A Lot 6 Click Score: 17    End of Session Equipment Utilized During Treatment: Gait belt Activity Tolerance: Patient tolerated treatment well Patient left: in bed;with call bell/phone within reach (on stretcher in ED) Nurse Communication: Mobility status PT Visit Diagnosis: Unsteadiness on feet (R26.81);History of falling (Z91.81);Muscle weakness (generalized) (M62.81)    Time: 9449-6759 PT Time Calculation (min) (ACUTE ONLY): 24 min   Charges:   PT Evaluation $PT Eval Moderate Complexity: 1 Mod          Reuel Derby, PT, DPT  Acute Rehabilitation Services  Office: [phone]   Rudean Hitt 12/06/2021, 11:45 AM

## 2021-12-06 NOTE — Assessment & Plan Note (Signed)
.   Resume home regimen of Synthroid 

## 2021-12-06 NOTE — ED Provider Notes (Addendum)
[facility] EMERGENCY DEPT Provider Note   CSN: [id number] Arrival date & time: 12/06/21  0103     History  No chief complaint on file.   Kathy Velez is a 88 y.o. female.  HPI     This is an 88 year old female who presents with bleeding hematoma.  Patient is on Eliquis.  She reportedly had a fall approximately 1 month ago.  Since that time she has had hematoma over the parietal scalp.  Over the last week or so, there has been a scab form and intermittent low-volume bleeding.  She woke up tonight with blood "all over my pillow."  Reported large volume blood loss on scene.  Compressive bandage in place.  Patient has difficulty providing history.  It is unclear what her baseline is.  Per chart review, it appears she has had some increasing confusion and recurrent falls.  Patient denies recent fall causing injury tonight.  Home Medications Prior to Admission medications   Medication Sig Start Date End Date Taking? Authorizing Provider  acetaminophen (TYLENOL) 325 MG tablet Take 650 mg by mouth every 4 (four) hours as needed.    [provider]  amoxicillin (AMOXIL) 500 MG capsule Take 500 mg by mouth. Before dental visits every 6 months    [provider]  apixaban (ELIQUIS) 2.5 MG TABS tablet Take 1 tablet (2.5 mg total) by mouth 2 (two) times daily. 11/07/21   Fargo, Amy E, NP  atorvastatin (LIPITOR) 20 MG tablet TAKE 1 TABLET DAILY 06/14/21   Virgie Dad, MD  Cholecalciferol (VITAMIN D) 50 MCG (2000 UT) CAPS Take 1 capsule by mouth daily.    [provider]  digoxin (LANOXIN) 0.125 MG tablet TAKE 1 TABLET DAILY 06/14/21   Virgie Dad, MD  ferrous sulfate 325 (65 FE) MG tablet Take 325 mg by mouth 2 (two) times daily.    [provider]  hydrocortisone cream 1 % Apply 1 Application topically daily.    [provider]  leflunomide (ARAVA) 20 MG tablet Take 20 mg by mouth daily. 10/05/19   [provider]   levothyroxine (SYNTHROID) 88 MCG tablet TAKE 1 TABLET DAILY 30 MINUTES BEFORE BREAKFAST ON AN EMPTY STOMACHTAKE 1 TABLET DAILY 30 MINUTES BEFORE BREAKFAST ON AN EMPTY STOMACH 11/24/21   Royal Hawthorn, NP  metoprolol succinate (TOPROL-XL) 50 MG 24 hr tablet TAKE 1 TABLET DAILY 06/14/21   Virgie Dad, MD  mirtazapine (REMERON) 15 MG tablet TAKE 1 TABLET AT BEDTIME 02/20/21   Virgie Dad, MD  MULTIPLE VITAMIN PO Take by mouth.    [provider]  Multiple Vitamins-Minerals (PRESERVISION AREDS 2) CAPS Take 1 Dose by mouth 2 (two) times daily. For eye health    [provider]  omeprazole (PRILOSEC) 40 MG capsule TAKE 1 CAPSULE DAILY 10/17/21   Virgie Dad, MD  sodium chloride 1 g tablet Take 1 g by mouth daily with breakfast.    [provider]  valsartan (DIOVAN) 80 MG tablet Take 0.5 tablets (40 mg total) by mouth daily. 10/26/21   Royal Hawthorn, NP  Vibegron (GEMTESA) 75 MG TABS Take 32.5 mg by mouth at bedtime. 11/10/21   Royal Hawthorn, NP  vitamin B-12 (CYANOCOBALAMIN) 1000 MCG tablet Take 1,000 mcg by mouth daily.     [provider]      Allergies    Codeine, Gabapentin, Molds & smuts, Pollen extract, and Bee venom    Review of Systems   Review of Systems  Skin:  Positive for wound.  All other systems reviewed and are negative.   Physical Exam Updated Vital Signs BP (!) 95/56   Pulse 61   Temp 97.7 F (36.5 C) (Oral)   Resp 20   SpO2 95%  Physical Exam Vitals and nursing note reviewed.  Constitutional:      Appearance: She is well-developed.     Comments: Elderly, nontoxic-appearing  HENT:     Head: Normocephalic.     Comments: Significant blood noted over the right side of the scalp, matted in the hair, there is a boggy hematoma over the right parietal scalp, arterial bleeding noted    Mouth/Throat:     Mouth: Mucous membranes are moist.  Eyes:     Pupils: Pupils are equal, round, and reactive to light.  Cardiovascular:      Rate and Rhythm: Normal rate and regular rhythm.     Heart sounds: Normal heart sounds.  Pulmonary:     Effort: Pulmonary effort is normal. No respiratory distress.     Breath sounds: No wheezing.  Abdominal:     Palpations: Abdomen is soft.  Musculoskeletal:     Cervical back: Neck supple.  Skin:    General: Skin is warm and dry.  Neurological:     Mental Status: She is alert and oriented to person, place, and time.     Comments: Slowed speech  Psychiatric:        Mood and Affect: Mood normal.     ED Results / Procedures / Treatments   Labs (all labs ordered are listed, but only abnormal results are displayed) Labs Reviewed  CBC WITH DIFFERENTIAL/PLATELET - Abnormal; Notable for the following components:      Result Value   RBC 2.50 (*)    Hemoglobin 8.5 (*)    HCT 25.3 (*)    MCV 101.2 (*)    Lymphs Abs 4.2 (*)    Monocytes Absolute 1.3 (*)    All other components within normal limits  BASIC METABOLIC PANEL - Abnormal; Notable for the following components:   Sodium 132 (*)    Glucose, Bld 157 (*)    Calcium 8.5 (*)    All other components within normal limits  HEMOGLOBIN AND HEMATOCRIT, BLOOD - Abnormal; Notable for the following components:   Hemoglobin 7.0 (*)    HCT 20.7 (*)    All other components within normal limits    EKG None  Radiology CT Head Wo Contrast  Result Date: 12/06/2021 CLINICAL DATA:  Bleeding wound EXAM: CT HEAD WITHOUT CONTRAST TECHNIQUE: Contiguous axial images were obtained from the base of the skull through the vertex without intravenous contrast. RADIATION DOSE REDUCTION: This exam was performed according to the departmental dose-optimization program which includes automated exposure control, adjustment of the mA and/or kV according to patient size and/or use of iterative reconstruction technique. COMPARISON:  10/27/2021 FINDINGS: Brain: No evidence of acute infarction, hemorrhage, hydrocephalus, extra-axial collection or mass lesion/mass  effect. Subcortical white matter and periventricular small vessel ischemic changes. Vascular: Intracranial atherosclerosis. Skull: Normal. Negative for fracture or focal lesion. Sinuses/Orbits: The visualized paranasal sinuses are essentially clear. The mastoid air cells are unopacified. Other: Small extracranial hematoma overlying the right parietal bone (series 3/image 32). IMPRESSION: Small extracranial hematoma overlying the right parietal bone. No evidence of calvarial fracture. No acute intracranial abnormality. Small vessel ischemic changes. Electronically Signed   By: Julian Hy M.D.   On: 12/06/2021 02:01    Procedures .Critical Care  Performed by:  Leasia Swann, Barbette Hair, MD Authorized by: Merryl Hacker, MD   Critical care provider statement:    Critical care time (minutes):  45   Critical care was necessary to treat or prevent imminent or life-threatening deterioration of the following conditions:  Shock (Hemorrhagic shock, bleeding control)   Critical care was time spent personally by me on the following activities:  Development of treatment plan with patient or surrogate, discussions with consultants, evaluation of patient's response to treatment, examination of patient, ordering and review of laboratory studies, ordering and review of radiographic studies, ordering and performing treatments and interventions, pulse oximetry, re-evaluation of patient's condition and review of old charts .Marland KitchenLaceration Repair  Date/Time: 12/06/2021 3:44 AM  Performed by: Merryl Hacker, MD Authorized by: Merryl Hacker, MD   Consent:    Consent obtained:  Verbal   Consent given by:  Patient Anesthesia:    Anesthesia method:  Local infiltration   Local anesthetic:  Lidocaine 2% WITH epi Laceration details:    Location:  Scalp   Scalp location:  R parietal   Length (cm):  2 (Large hematoma with overlying skin break)   Depth (mm):  5 Exploration:    Limited defect created (wound  extended): no     Hemostasis achieved with:  Tied off vessels, epinephrine and direct pressure   Wound extent: vascular damage     Contaminated: yes   Treatment:    Irrigation solution:  Sterile saline   Irrigation volume:  2 L Skin repair:    Repair method:  Staples   Number of staples:  2 Repair type:    Repair type:  Complex Post-procedure details:    Dressing:  Bulky dressing   Procedure completion:  Tolerated Comments:     Large hematoma with overlying skin break, arterial bleed noted with copious bleeding at bedside, direct pressure was applied, figure-of-eight stitch with a 4-0 Vicryl was placed with good bleeding control.  Overlying 2 staples were placed.     Medications Ordered in ED Medications  lidocaine-EPINEPHrine (XYLOCAINE W/EPI) 2 %-1:200000 (PF) injection 20 mL (20 mLs Infiltration Given 12/06/21 0134)  sodium chloride 0.9 % bolus 1,000 mL (0 mLs Intravenous Stopped 12/06/21 0318)    ED Course/ Medical Decision Making/ A&P Clinical Course as of 12/06/21 0340  Wed Dec 06, 2021  0230 Was called urgently to the patient's bedside.  Patient noted to be acutely hypotensive.  Pale.  She does have a palpable radial pulse.  Systolic blood pressure 70.  Liter of fluids ordered.  Labs obtained.  Initial hemoglobin is 8.5 which is downtrending from prior.  Repeat hemoglobin 1 hour later 7.0.  Feel hypotension may be related to acute blood loss given the amount of blood lost at bedside.  Patient's blood pressure has stabilized at 810 systolic.  Do not feel she needs emergent release blood but will likely need a blood transfusion.  We will plan for admission to the hospital. [CH]    Clinical Course User Index [CH] Eleonor Ocon, Barbette Hair, MD                           Medical Decision Making Amount and/or Complexity of Data Reviewed Labs: ordered. Radiology: ordered.  Risk Prescription drug management. Decision regarding hospitalization.   This patient presents to the ED for  concern of bleeding hematoma, this involves an extensive number of treatment options, and is a complaint that carries with it a high risk of complications  and morbidity.  I considered the following differential and admission for this acute, potentially life threatening condition.  The differential diagnosis includes arterial bleed, vascular bleed, bleeding hematoma, new injury  MDM:    This is an 88 year old female who presents with bleeding from her head.  Reports prior hematoma.  It appears that that hematoma has eroded and is now bleeding externally.  Appears to be an arterial bleed.  She has copious blood on her head as well as at the bedside.  Bleeding was temporized with a figure-of-eight stitch and 2 staples.  Repeat CT scan was obtained and shows no intracranial bleed.  Hemoglobin down trended to 8.5.  Repeat hemoglobin 7.0.  Patient did have an episode of hypotension dropping her blood pressure as low as 66/40.  She is very pale on evaluation but had a radial pulse.  She is given a liter of fluids with rebound of her blood pressure to 301 systolic.  Given repeat hemoglobin of 7.0.  She will likely need transfusion as I suspect she is even lower than this given acute bleeding and amount of blood loss at the bedside.  We will plan for admission to the hospitalist.  At this time she is temporized with fluids.  However, she may need more emergent transfer for transfusion versus uncrossmatch transfusion here.  We will attempt to hold off so that she can get crossmatch blood.  (Labs, imaging, consults)  Labs: I Ordered, and personally interpreted labs.  The pertinent results include: CBC, BMP  Imaging Studies ordered: I ordered imaging studies including CT head I independently visualized and interpreted imaging. I agree with the radiologist interpretation  Additional history obtained from other at bedside.  External records from outside source obtained and reviewed including outpatient records and  evaluation  Cardiac Monitoring: The patient was maintained on a cardiac monitor.  I personally viewed and interpreted the cardiac monitored which showed an underlying rhythm of: Sinus rhythm  Reevaluation: After the interventions noted above, I reevaluated the patient and found that they have :improved  Social Determinants of Health: Lives at [facility]  Disposition: Admit  Co morbidities that complicate the patient evaluation  Past Medical History:  Diagnosis Date   Allergy    seasonal   Anemia    Anxiety    Arthritis    Back   Atrial fibrillation ([place])    Cancer ([place]) 2006   Colon.  Basal Cell Skin cancer- right arm   Cataract    removed bilateral   Colon cancer ([facility])    Constipation due to pain medication therapy    after heart surgery   Dysrhythmia    PAF   GERD (gastroesophageal reflux disease)    Heart murmur    Hyperlipidemia    Hypertension    Hypothyroidism    RA (rheumatoid arthritis) ([facility])    Restless leg    Seizures ([place])    after Heart Surgey   Stroke ([place])    TIA- found by neurologist after      Medicines Meds ordered this encounter  Medications   DISCONTD: lidocaine (XYLOCAINE) 2 % (with pres) injection 400 mg   lidocaine-EPINEPHrine (XYLOCAINE W/EPI) 2 %-1:200000 (PF) injection 20 mL   sodium chloride 0.9 % bolus 1,000 mL    I have reviewed the patients home medicines and have made adjustments as needed  Problem List / ED Course: Problem List Items Addressed This Visit   None Visit Diagnoses     Acute blood loss anemia    -  Primary   Traumatic hematoma                       Final Clinical Impression(s) / ED Diagnoses Final diagnoses:  Acute blood loss anemia  Traumatic hematoma    Rx / DC Orders ED Discharge Orders     None         Leaman Abe, Barbette Hair, MD 12/06/21 2800    Merryl Hacker, MD 01/13/22 [phone]

## 2021-12-06 NOTE — Progress Notes (Signed)
Mobility Specialist Progress Note:   12/06/21 1600  Mobility  Activity Ambulated with assistance in room  Level of Assistance Contact guard assist, steadying assist  Assistive Device Other (Comment) (HHA)  Distance Ambulated (ft) 25 ft  Activity Response Tolerated well  $Mobility charge 1 Mobility   Pt received in bathroom and agreeable. No complaints. Pt left in chair with all needs met and call bell in reach.   Kelcy Baeten Mobility Specialist-Acute Rehab Secure Chat only

## 2021-12-06 NOTE — ED Triage Notes (Signed)
Pt had fall 3 weeks ago and continued blood thinner. Bleeding was controoled until tonight when it started bleeding again. Pt seen at [facility] for bleeding. Received sutures and staple. Hematoma wrapped upon arrival

## 2021-12-06 NOTE — Assessment & Plan Note (Signed)
   Patient developed chronic scalp hematoma since a fall approximately 5 weeks ago  Since then patient has been experiencing intermittent bleeding from scalp, culminating in a rather large bleed earlier this morning  Hemoglobin on arrival to [facility] emergency department was 8.5, down from 11.4 three weeks prior  Bleeding vessels were tied off of the [facility] emergency department and hemostasis was achieved.   Hemoglobin seems to have dropped further to 7.0  with some associated hypotension prompting an ED to ED transfer to [place] is already ordered a 1 unit blood transfusion upon [facility] to [facility]   We will additionally provide patient with continued gentle intravenous hydration with crystalloid  Holding Eliquis  Expect now that hemostasis is achieved hemoglobin and hematocrit remain stable  We will obtain an additional CBC in several hours  Additionally obtaining coagulation profile, folate, vitamin B12 and iron panel  Wound care will be asked to come reevaluate the wound and change the dressing

## 2021-12-06 NOTE — ED Provider Notes (Signed)
Patient presents as a transfer from [facility].  Patient initially seen there for leaking from her scalp.  Patient hypotensive at arrival to ED initially, improved with IV fluids.  Patient with a bleeding vessel which was addressed with sutures and staples.  Patient found to be anemic, transferred ED to ED because there is no blood available [facility].  Comfortable at arrival.  Vital signs normal including blood pressure 120/61.  Dressing in place, no active bleeding.  Repeat H&H, type and screen ordered, transfusion of 1 unit packed red blood cells ordered.  Admit to hospitalist.   Orpah Greek, MD 12/06/21 [phone]

## 2021-12-06 NOTE — Assessment & Plan Note (Signed)
Continuing home regimen of daily PPI therapy.  

## 2021-12-06 NOTE — ED Notes (Signed)
1x stitch, 2x staples placed in R head at this time.

## 2021-12-06 NOTE — Assessment & Plan Note (Signed)
   Continue home regimen of leflunomide

## 2021-12-06 NOTE — ED Notes (Signed)
PT states pt had a large dark stool during their time with her. MD notified, and 1st unit PRBC's just completed. VS remain stable. Pt returned to bed, warm blankets and call bell placed with pt. Purewick also in place now.

## 2021-12-06 NOTE — Consult Note (Addendum)
Kathy Velez 1931-05-11  [id number].    Requesting MD: Dr. Murray Hodgkins Chief Complaint/Reason for Consult: Scalp laceration  HPI: Kathy Velez is a 88 y.o. female with a hx of CAF on apixaban, RA on Leflunomide, prior TIA, hypertension, hypothyroidism who presented from [place] facility for scalp hematoma.  Patient has history of multiple falls in the past with several prior ED visits.  She apparently had a fall at her ALF facility last month where she was evaluated in the ED and found to have a scalp hematoma. This has not fully healed and she reports mild oozing to the area periodically since - mostly at night. Supposedly she fell out of bed again about 1 week ago and has had increased spotting from the scalp hematoma. She has also had transient confusion noted by staff so was sent over for evaluation. CTH showed small extracranial hematoma overlying the right parietal bone without evidence of fracture or other intracranial abnormality. Arterial bleeding noted by EDP on exam and laceration repair was performed with staples with hemostasis achieved. Noted to also have ABL anemia with last hgb 7.0 requiring 1U PRBC today. She was transferred to [facility] for admission. We were asked to see in consult.  She tells me she is only having minimal discomfort from site of bleeding on her head. No headaches or vision changes. She denies any injury elsewhere. She has not neck pain.   Family History  Problem Relation Age of Onset   Lung disease Mother 61   Heart disease Father 45   Colon polyps Daughter    Alcohol abuse Son    Colon cancer Neg Hx    Stomach cancer Neg Hx    Rectal cancer Neg Hx    Crohn's disease Neg Hx    Esophageal cancer Neg Hx     Past Medical History:  Diagnosis Date   Allergy    seasonal   Anemia    Anxiety    Arthritis    Back   Atrial fibrillation ([place])    Cancer ([place]) 2006   Colon.  Basal Cell Skin cancer- right arm   Cataract    removed bilateral   Colon  cancer (HCC)    Constipation due to pain medication therapy    after heart surgery   Dysrhythmia    PAF   GERD (gastroesophageal reflux disease)    Heart murmur    Hyperlipidemia    Hypertension    Hypothyroidism    RA (rheumatoid arthritis) ([place])    Restless leg    Seizures ([place])    after Heart Surgey   Stroke ([place])    TIA- found by neurologist after     Past Surgical History:  Procedure Laterality Date   ABDOMINAL HYSTERECTOMY  1970   Partial    COLON RESECTION  2006   cancer   COLON SURGERY     COLONOSCOPY     EYE SURGERY Bilateral    Cataract   MAXIMUM ACCESS (MAS)POSTERIOR LUMBAR INTERBODY FUSION (PLIF) 2 LEVEL N/A 04/12/2015   Procedure: Lumbar Three-Five Decompression, Pedicle Screw Fixation, and Posteriolateral Arthrodesis;  Surgeon: Erline Levine, MD;  Location: [facility];  Service: Neurosurgery;  Laterality: N/A;  L3-4 L4-5 Maximum access posterior lumbar fusion, possible interbodies and resection of synovial cyst at L4-5   MITRAL VALVE REPAIR  01/20/2013   Gore-tex cords to P1, P2, and P3. Magic suture to posterior medial commisure, #30 Physio 1 ring. Done in [place]   TONSILLECTOMY  about [place]  01/20/2013   #28 TriAd ring done in [place]    Social History:  reports that she has never smoked. She has been exposed to tobacco smoke. She has never used smokeless tobacco. She reports current alcohol use of about 1.0 standard drink of alcohol per week. She reports that she does not use drugs.  Allergies:  Allergies  Allergen Reactions   Claritin [Loratadine] Other (See Comments)    Listed on MAR Unknown reaction   Codeine Other (See Comments)    "just don't take it well"   Gabapentin Other (See Comments)    Dizziness    Molds & Smuts    Pollen Extract Swelling   Bee Venom Swelling and Rash   Wasp Venom Swelling and Rash    (Not in a hospital admission)    Physical Exam: Blood pressure 135/66, pulse 78, temperature 98.2  F (36.8 C), temperature source Oral, resp. rate 12, height '5\' 2"'$  (1.575 m), weight 55.7 kg, SpO2 98 %. PE: General: pleasant elderly female who is laying in bed in NAD HEENT: right parietal scalp laceration with 2 staples and one visible suture present and no active bleeding. There is a palpable hematoma 5x2cm.  Sclera are noninjected.  Pupils equal and round. EOMs intact.  Ears and nose without any masses or lesions.  Mouth is pink and moist Heart: irregular rhythm - afib.  Normal s1,s2. No obvious murmurs, gallops, or rubs noted.  Palpable radial and pedal pulses bilaterally Lungs: CTAB, no wheezes, rhonchi, or rales noted.  Respiratory effort nonlabored Abd: soft, NT, ND MSK: all 4 extremities are symmetrical with no cyanosis, clubbing, or edema. No pain with active ROM of neck Skin: warm and dry with no masses, lesions, or rashes Neuro: Cranial nerves 2-12 grossly intact, sensation is normal throughout Psych: A&Ox3 with an appropriate affect.  Results for orders placed or performed during the hospital encounter of 12/06/21 (from the past 48 hour(s))  CBC with Differential     Status: Abnormal   Collection Time: 12/06/21  2:11 AM  Result Value Ref Range   WBC 9.9 4.0 - 10.5 K/uL   RBC 2.50 (L) 3.87 - 5.11 MIL/uL   Hemoglobin 8.5 (L) 12.0 - 15.0 g/dL   HCT 25.3 (L) 36.0 - 46.0 %   MCV 101.2 (H) 80.0 - 100.0 fL   MCH 34.0 26.0 - 34.0 pg   MCHC 33.6 30.0 - 36.0 g/dL   RDW 15.0 11.5 - 15.5 %   Platelets 186 150 - 400 K/uL   nRBC 0.0 0.0 - 0.2 %   Neutrophils Relative % 41 %   Neutro Abs 4.1 1.7 - 7.7 K/uL   Lymphocytes Relative 43 %   Lymphs Abs 4.2 (H) 0.7 - 4.0 K/uL   Monocytes Relative 13 %   Monocytes Absolute 1.3 (H) 0.1 - 1.0 K/uL   Eosinophils Relative 2 %   Eosinophils Absolute 0.2 0.0 - 0.5 K/uL   Basophils Relative 1 %   Basophils Absolute 0.1 0.0 - 0.1 K/uL   Immature Granulocytes 0 %   Abs Immature Granulocytes 0.03 0.00 - 0.07 K/uL    Comment: Performed at [facility], [address] [place], [place] [address]  Basic metabolic panel     Status: Abnormal   Collection Time: 12/06/21  2:11 AM  Result Value Ref Range   Sodium 132 (L) 135 - 145 mmol/L   Potassium 3.5 3.5 - 5.1 mmol/L   Chloride  100 98 - 111 mmol/L   CO2 22 22 - 32 mmol/L   Glucose, Bld 157 (H) 70 - 99 mg/dL    Comment: Glucose reference range applies only to samples taken after fasting for at least 8 hours.   BUN 21 8 - 23 mg/dL   Creatinine, Ser 0.90 0.44 - 1.00 mg/dL   Calcium 8.5 (L) 8.9 - 10.3 mg/dL   GFR, Estimated >60 >60 mL/min    Comment: (NOTE) Calculated using the CKD-EPI Creatinine Equation (2021)    Anion gap 10 5 - 15    Comment: Performed at [facility], [place], [place] [address]  Hemoglobin and hematocrit, blood     Status: Abnormal   Collection Time: 12/06/21  3:13 AM  Result Value Ref Range   Hemoglobin 7.0 (L) 12.0 - 15.0 g/dL   HCT 20.7 (L) 36.0 - 46.0 %    Comment: Performed at [facility], [place], [place] [address]  Hemoglobin and hematocrit, blood     Status: Abnormal   Collection Time: 12/06/21  5:20 AM  Result Value Ref Range   Hemoglobin 8.2 (L) 12.0 - 15.0 g/dL   HCT 25.1 (L) 36.0 - 46.0 %    Comment: Performed at [facility], [place] [address] [place], [place] [address]  Type and screen [place]     Status: None (Preliminary result)   Collection Time: 12/06/21  5:20 AM  Result Value Ref Range   ABO/RH(D) O NEG    Antibody Screen NEG    Sample Expiration 12/09/2021,2359    Unit Number [id number]    Blood Component Type RED CELLS,LR    Unit division 00    Status of Unit ISSUED    Transfusion Status OK TO TRANSFUSE    Crossmatch Result      Compatible Performed at [facility], [place] [address] [place], [place] [address]   Prepare RBC (crossmatch)     Status: None   Collection Time: 12/06/21  6:00 AM  Result Value  Ref Range   Order Confirmation      ORDER PROCESSED BY BLOOD BANK Performed at [facility], [place] [address], [place], [place] [address]   Protime-INR     Status: Abnormal   Collection Time: 12/06/21  8:23 AM  Result Value Ref Range   Prothrombin Time 15.5 (H) 11.4 - 15.2 seconds   INR 1.2 0.8 - 1.2    Comment: (NOTE) INR goal varies based on device and disease states. Performed at [facility], [place] [address] [place], [place] [address]   APTT     Status: None   Collection Time: 12/06/21  8:23 AM  Result Value Ref Range   aPTT 30 24 - 36 seconds    Comment: Performed at [facility] [address], [place], [place] [address]  Folate     Status: None   Collection Time: 12/06/21  8:23 AM  Result Value Ref Range   Folate 10.2 >5.9 ng/mL    Comment: Performed at [facility] [address], [place], [place] [address]  Iron and TIBC     Status: Abnormal   Collection Time: 12/06/21  8:23 AM  Result Value Ref Range   Iron 73 28 - 170 ug/dL   TIBC 248 (L) 250 - 450 ug/dL   Saturation Ratios 30 10.4 - 31.8 %   UIBC 175 ug/dL  Comment: Performed at [facility], [place] [address], [place], [place] [address]  Vitamin B12     Status: None   Collection Time: 12/06/21  8:23 AM  Result Value Ref Range   Vitamin B-12 834 180 - 914 pg/mL    Comment: (NOTE) This assay is not validated for testing neonatal or myeloproliferative syndrome specimens for Vitamin B12 levels. Performed at [facility], [place] [address] [place], [place] [address]    CT Head Wo Contrast  Result Date: 12/06/2021 CLINICAL DATA:  Bleeding wound EXAM: CT HEAD WITHOUT CONTRAST TECHNIQUE: Contiguous axial images were obtained from the base of the skull through the vertex without intravenous contrast. RADIATION DOSE REDUCTION: This exam was performed according to the departmental dose-optimization program which includes automated exposure control, adjustment of the mA and/or kV  according to patient size and/or use of iterative reconstruction technique. COMPARISON:  10/27/2021 FINDINGS: Brain: No evidence of acute infarction, hemorrhage, hydrocephalus, extra-axial collection or mass lesion/mass effect. Subcortical white matter and periventricular small vessel ischemic changes. Vascular: Intracranial atherosclerosis. Skull: Normal. Negative for fracture or focal lesion. Sinuses/Orbits: The visualized paranasal sinuses are essentially clear. The mastoid air cells are unopacified. Other: Small extracranial hematoma overlying the right parietal bone (series 3/image 32). IMPRESSION: Small extracranial hematoma overlying the right parietal bone. No evidence of calvarial fracture. No acute intracranial abnormality. Small vessel ischemic changes. Electronically Signed   By: Julian Hy M.D.   On: 12/06/2021 02:01    Anti-infectives (From admission, onward)    None       Assessment/Plan History of multiple falls Scalp hematoma with arterial bleed - s/p suture/staple repair in ED 9/13 with hemostasis achieved. Continue staples for about 10 days. Ice, pain control ABL anemia secondary to scalp hematoma - s/p 1u PRBCs. Hold eliquis and trend h/h Chronic A fib on eliquis - continue holding eliquis. Consider holding indefinitely given that the patient is elderly, DNR, and has a h/o multiple falls HTN Rheumatoid arthritis Hypothyroidism Chronic hyponatremia Hx TIA Code status DNR  FEN - heart healthy diet VTE - SCDs ID - none Foley - none   I reviewed ED provider notes, hospitalist notes, last 24 h vitals and pain scores, last 48 h intake and output, last 24 h labs and trends, and last 24 h imaging results.   Richard Miu, [facility] 12/06/2021, 11:13 AM Please see Amion for pager number during day hours 7:00am-4:30pm

## 2021-12-06 NOTE — ED Notes (Signed)
Report taken from [facility]. Assumed care to this pt at this time.

## 2021-12-06 NOTE — ED Notes (Signed)
SBAR report given to Charge, Therapist, sports at [facility]. Pt, belongings, paperwork transferred to [facility] ED at this time via [facility]

## 2021-12-06 NOTE — Progress Notes (Signed)
Progress Note   Patient: Kathy Velez NID:[id number] DOB: 01-17-1932 DOA: 12/06/2021     0 DOS: the patient was seen and examined on 12/06/2021   Brief hospital course: 88 year old woman PMH includes atrial fibrillation on apixaban, colon cancer in remission, presented with bleeding from scalp, found to have large hematoma with arterial bleeding requiring acute intervention and associated with significant blood loss requiring PRBC transfusion.  Currently appears stable.  Surgery consulted for further recommendations.  Observe additional 24 hours, if stable, possible release 9/14.  Assessment and Plan: * Scalp hematoma with associated ABLA secondary to arterial bleed -- Initial event was closed head injury secondary to fall 7/30, seen in the ED at that time.  No apparent bleeding until approximately a week ago, unclear whether secondary to recurrent fall which occurred about a week ago with closed head injury but in a different site.  Complicated by ongoing administration of apixaban presumably. --Significant bleed in the emergency department, arterial Per documentation, requiring vessel tie off and staples with substantial blood loss documented. --Pressure dressing in place.  Currently no apparent bleeding noted.  Status post 1 unit PRBC. --Continue to hold apixaban as the risk outweighs the benefit in the short-term.  Can consider resuming apixaban as an outpatient. -- Will ask surgery to see given acute on chronic nature of this hematoma with arterial bleed.  Discussed with wound care RN.  Chronic atrial fibrillation (HCC) --Given at least 2 falls in the last 6 weeks, life-threatening bleed from hematoma, careful consideration will need to be given in the outpatient setting as to risk versus benefit in this patient with history of atrial fibrillation and stroke.   -- Continue metoprolol.  Essential hypertension --Continue chronic medications.  Frequent falls --Reported frequent  falls --Holding Eliquis for now.  --PT eval  Rheumatoid arthritis ([place]) --Continue home regimen of leflunomide  Hypothyroidism --Continue home regimen of Synthroid  Hyponatremia --Longstanding history of mild hyponatremia, presumably SIADH although this is not noted in the patient's chart --Baseline is typically in the low 130s --We will continue daily salt tablets per home regimen        Subjective:  Feels fine today.  Corroborates history with fall several weeks ago.  Currently feels fine.  Has had some bleeding over the last week.  Per dgt patient slid off bed about a week ago, but hit head in different place.  Physical Exam: Vitals:   12/06/21 0815 12/06/21 0900 12/06/21 0930 12/06/21 1000  BP: 127/72 (!) 148/87 128/80 135/66  Pulse: 79 (!) 106 86 78  Resp: '17 16 18 12  '$ Temp:      TempSrc:      SpO2: 98% 100% 99% 98%  Weight:      Height:       Physical Exam Vitals reviewed.  Constitutional:      General: She is not in acute distress.    Appearance: She is not ill-appearing or toxic-appearing.  HENT:     Head:     Comments: Pressure dressing in place Cardiovascular:     Rate and Rhythm: Normal rate and regular rhythm.     Heart sounds: No murmur heard. Pulmonary:     Effort: Pulmonary effort is normal. No respiratory distress.     Breath sounds: No wheezing or rhonchi.  Neurological:     Mental Status: She is alert.  Psychiatric:        Mood and Affect: Mood normal.        Behavior: Behavior normal.  Data Reviewed:  Hemoglobin 10.4 on August 31, 8.59/13, then down to 7,, up to 8.2.  Now status post 1 unit PRBC. Sodium mildly low at 132. CT head no acute intracranial changes.  Small extracranial hematoma noted over right parietal bone.  Family Communication: daughter Di Kindle by phone  Disposition: Status is: Observation   Planned Discharge Destination:  return to ALF    Time spent: 35 minutes  Author: Murray Hodgkins,  MD 12/06/2021 10:28 AM  For on call review [email or web address].

## 2021-12-06 NOTE — Assessment & Plan Note (Signed)
.   Continuing home regimen of lipid lowering therapy.  

## 2021-12-06 NOTE — Progress Notes (Signed)
Nurse attempted to wash dried blood out of patient's scalp. Wound appeared to slightly bleed, nurse advised patient to retry at a later time to prevent further bleeding. Pt verbalized understanding and was agreeable to POC. Will pass on to next shift

## 2021-12-06 NOTE — ED Triage Notes (Signed)
Pt comes from [facility] for a bleeding wound on her head, pt has a scab on her head and woke up tonight with it bleeding, did not fall tonight. Has pressure dressing in place

## 2021-12-06 NOTE — Evaluation (Signed)
Occupational Therapy Evaluation Patient Details Name: Kathy Velez MRN: [id number] DOB: [birth year] Today's Date: 12/06/2021   History of Present Illness 88 year old female presents to [facility] emergency department with bleeding from her scalp (fall on 8/4 with scalp hematoma that kept intermittently bleeding then on day of admission extensive bleeding) . PHMx: atrial fibrillation, hypothyroidism, gastroesophageal reflux disease, hypertension, rheumatoid arthritis, mitral valve and tricuspid repair, adenocarcinoma of the colon (2006 S/P right colectomy with recurrence 01/2021 S/P Pembrolizumab cycles x 4), prior TIA and CVA, rheumatoid arthritis.   Clinical Impression   This 88 yo female admitted with above presents to acute OT with PLOF of being Mod I with basic ADLs from a rollator level. Currently she is setup/S-min A for basic ADLs and mobility. She will continue to benefit from acute OT with follow up [place] back at ALF if they can A her more initially with basic ADLs and mobility, if not then will need ST SNF. We will continue to follow.      Recommendations for follow up therapy are one component of a multi-disciplinary discharge planning process, led by the attending physician.  Recommendations may be updated based on patient status, additional functional criteria and insurance authorization.   Follow Up Recommendations  Home health OT (if ALF can A her more with basic ADLs and mobility to begin with; otherwise SNF short term)    Assistance Recommended at Discharge Intermittent Supervision/Assistance  Patient can return home with the following A little help with walking and/or transfers;A little help with bathing/dressing/bathroom;Assistance with cooking/housework;Assist for transportation;Direct supervision/assist for financial management;Direct supervision/assist for medications management    Functional Status Assessment  Patient has had a recent decline in their functional status  and demonstrates the ability to make significant improvements in function in a reasonable and predictable amount of time.  Equipment Recommendations  None recommended by OT       Precautions / Restrictions Precautions Precautions: Fall Restrictions Weight Bearing Restrictions: No      Mobility Bed Mobility Overal bed mobility: Needs Assistance Bed Mobility: Supine to Sit, Sit to Supine     Supine to sit: Min guard, HOB elevated Sit to supine: Min guard        Transfers Overall transfer level: Needs assistance   Transfers: Sit to/from Stand             General transfer comment: min guard A if from regular bed height and rollator (from stretcher +2 min A and Bil HHA)      Balance Overall balance assessment: Needs assistance Sitting-balance support: No upper extremity supported, Feet supported Sitting balance-Leahy Scale: Good Sitting balance - Comments: sitting in chair to take off and put on socks   Standing balance support: No upper extremity supported, During functional activity Standing balance-Leahy Scale: Fair Standing balance comment: standing at sink to wash hands, if amublating Bil UE support                           ADL either performed or assessed with clinical judgement   ADL Overall ADL's : Needs assistance/impaired Eating/Feeding: Independent;Sitting   Grooming: Min guard;Standing;Wash/dry hands   Upper Body Bathing: Supervision/ safety;With adaptive equipment;Sitting   Lower Body Bathing: Min guard;Sit to/from stand   Upper Body Dressing : Set up;Supervision/safety;Sitting   Lower Body Dressing: Min guard;Sit to/from stand Lower Body Dressing Details (indicate cue type and reason): Pt normally leans forward to put on and take off her socks while seated. She  is also able to cross her legs to take off and put on her socks. Discussed with her that the second method is safer so she does not potentially fall forward--she verbalized  understanding Toilet Transfer: Minimal assistance;Ambulation Toilet Transfer Details (indicate cue type and reason): Bil HHA (pt normally uses rollator) Toileting- Water quality scientist and Hygiene: Min guard;Sit to/from stand               Vision Patient Visual Report: No change from baseline              Pertinent Vitals/Pain Pain Assessment Pain Assessment: No/denies pain     Hand Dominance  Right (OA)   Extremity/Trunk Assessment Upper Extremity Assessment Upper Extremity Assessment: Generalized weakness           Communication  No issues pta   Cognition Arousal/Alertness: Awake/alert Behavior During Therapy: WFL for tasks assessed/performed Overall Cognitive Status: No family/caregiver present to determine baseline cognitive functioning                                 General Comments: Slow processing noted     General Comments  VSS            Home Living Family/patient expects to be discharged to:: Assisted living                             Home Equipment: Rollator (4 wheels)   Additional Comments: [facility]      Prior Functioning/Environment               Mobility Comments: Uses rollator for ambulation ADLs Comments: Pt reports she is able to bathe and dress herself.        OT Problem List: Impaired balance (sitting and/or standing)      OT Treatment/Interventions: Self-care/ADL training;DME and/or AE instruction;Patient/family education;Balance training    OT Goals(Current goals can be found in the care plan section) Acute Rehab OT Goals Patient Stated Goal: did not state OT Goal Formulation: With patient Time For Goal Achievement: 12/20/21 Potential to Achieve Goals: Good  OT Frequency: Min 2X/week    Co-evaluation PT/OT/SLP Co-Evaluation/Treatment: Yes Reason for Co-Treatment: For patient/therapist safety;To address functional/ADL transfers PT goals addressed during session: Mobility/safety  with mobility;Balance;Strengthening/ROM OT goals addressed during session: Strengthening/ROM;ADL's and self-care      AM-PAC OT "6 Clicks" Daily Activity     Outcome Measure Help from another person eating meals?: None Help from another person taking care of personal grooming?: A Little Help from another person toileting, which includes using toliet, bedpan, or urinal?: A Little Help from another person bathing (including washing, rinsing, drying)?: A Little Help from another person to put on and taking off regular upper body clothing?: A Little Help from another person to put on and taking off regular lower body clothing?: A Little 6 Click Score: 19   End of Session Equipment Utilized During Treatment: Gait belt Nurse Communication: Mobility status (stool was dark)  Activity Tolerance: Patient tolerated treatment well Patient left: in bed;with call bell/phone within reach  OT Visit Diagnosis: Unsteadiness on feet (R26.81);Other abnormalities of gait and mobility (R26.89);Muscle weakness (generalized) (M62.81);Other symptoms and signs involving cognitive function                Time: 0102-7253 OT Time Calculation (min): 25 min Charges:  OT General Charges $OT Visit: 1 Visit OT Evaluation $OT  Eval Moderate Complexity: 1 Mod  [place] [address], OTR/L Acute Rehab Services [facility] [phone] Office [phone]    Almon Register 12/06/2021, 9:40 AM

## 2021-12-06 NOTE — Hospital Course (Signed)
88 year old woman PMH includes atrial fibrillation on apixaban, colon cancer in remission, presented with bleeding from scalp, found to have large hematoma with arterial bleeding requiring acute intervention and associated with significant blood loss requiring PRBC transfusion.  Currently appears stable.  Surgery consulted for further recommendations.  Observe additional 24 hours, if stable, possible release 9/14.

## 2021-12-06 NOTE — Assessment & Plan Note (Addendum)
   Considering patient's advanced age, reported frequent falls and this large scalp hematoma the risk of continuing Eliquis seems to outweigh the benefit  Holding Eliquis for now.  Patient has a relatively high CHA2DS2-VASc score of 6 as well as history of stroke/TIA (I was only able to locate an outside hospital admission for TIA in care everywhere in 2021) and therefore curbsiding and cardiology may prove useful in deciding long-term plan for anticoagulation for this patient.   We will continue rate controlling medication of metoprolol with parameters for administration  Monitoring patient on telemetry, currently rate controlled

## 2021-12-06 NOTE — ED Notes (Signed)
PT evaluating pt in room at this time

## 2021-12-06 NOTE — Consult Note (Signed)
[facility] Nurse Consult Note: Patient receiving care in [place]. Reason for Consult: scalp wound Wound type: per MD documentation, the patient has a bleeding arterial head wound that required sutures and described as "Significant blood noted over the right side of the scalp, matted in the hair, there is a boggy hematoma over the right parietal scalp, arterial bleeding noted". I further understand that the patient has a pressure dressing on at this time. I spoke with Dr. Florencia Reasons over the telephone about this consult.  I explained that these types of bleeding hematomas exceed the scope of practice of the [facility] nurse. Sometimes these need to be surgically evacuated, sometimes they become infected, sometimes other measures are not in the [facility] scope of practice.  He voiced his understanding to this information. I suggested that if additional help is needed, perhaps a surgical consult would be necessary.  [facility] nurse will not follow at this time.  Please re-consult the [facility] team if needed.  Val Riles, RN, MSN, CWOCN, CNS-BC, pager [phone]

## 2021-12-06 NOTE — Assessment & Plan Note (Addendum)
   Reported frequent falls  Holding Eliquis for now.  Patient has a relatively high CHA2DS2-VASc score of 6 as well as history of stroke/TIA and therefore curbsiding and cardiology may prove useful in deciding long-term plan for anticoagulation for this patient.   Obtaining PT evaluation  Fall precautions

## 2021-12-07 DIAGNOSIS — D62 Acute posthemorrhagic anemia: Secondary | ICD-10-CM

## 2021-12-07 DIAGNOSIS — S0003XA Contusion of scalp, initial encounter: Secondary | ICD-10-CM | POA: Diagnosis not present

## 2021-12-07 DIAGNOSIS — S0003XD Contusion of scalp, subsequent encounter: Secondary | ICD-10-CM | POA: Diagnosis not present

## 2021-12-07 DIAGNOSIS — I482 Chronic atrial fibrillation, unspecified: Secondary | ICD-10-CM | POA: Diagnosis not present

## 2021-12-07 LAB — BPAM RBC
Blood Product Expiration Date: 202309252359
ISSUE DATE / TIME: 202309130637
Unit Type and Rh: 9500

## 2021-12-07 LAB — CBC WITH DIFFERENTIAL/PLATELET
Abs Immature Granulocytes: 0.03 10*3/uL (ref 0.00–0.07)
Basophils Absolute: 0.1 10*3/uL (ref 0.0–0.1)
Basophils Relative: 1 %
Eosinophils Absolute: 0.2 10*3/uL (ref 0.0–0.5)
Eosinophils Relative: 3 %
HCT: 25 % — ABNORMAL LOW (ref 36.0–46.0)
Hemoglobin: 8.7 g/dL — ABNORMAL LOW (ref 12.0–15.0)
Immature Granulocytes: 0 %
Lymphocytes Relative: 25 %
Lymphs Abs: 1.8 10*3/uL (ref 0.7–4.0)
MCH: 34.3 pg — ABNORMAL HIGH (ref 26.0–34.0)
MCHC: 34.8 g/dL (ref 30.0–36.0)
MCV: 98.4 fL (ref 80.0–100.0)
Monocytes Absolute: 1.1 10*3/uL — ABNORMAL HIGH (ref 0.1–1.0)
Monocytes Relative: 15 %
Neutro Abs: 4 10*3/uL (ref 1.7–7.7)
Neutrophils Relative %: 56 %
Platelets: 141 10*3/uL — ABNORMAL LOW (ref 150–400)
RBC: 2.54 MIL/uL — ABNORMAL LOW (ref 3.87–5.11)
RDW: 17.2 % — ABNORMAL HIGH (ref 11.5–15.5)
WBC: 7.2 10*3/uL (ref 4.0–10.5)
nRBC: 0 % (ref 0.0–0.2)

## 2021-12-07 LAB — BASIC METABOLIC PANEL
Anion gap: 10 (ref 5–15)
BUN: 15 mg/dL (ref 8–23)
CO2: 23 mmol/L (ref 22–32)
Calcium: 9.1 mg/dL (ref 8.9–10.3)
Chloride: 104 mmol/L (ref 98–111)
Creatinine, Ser: 0.79 mg/dL (ref 0.44–1.00)
GFR, Estimated: >60 mL/min (ref >60)
Glucose, Bld: 90 mg/dL (ref 70–99)
Potassium: 4.2 mmol/L (ref 3.5–5.1)
Sodium: 137 mmol/L (ref 135–145)

## 2021-12-07 LAB — TYPE AND SCREEN
ABO/RH(D): O NEG
Antibody Screen: NEGATIVE
Unit division: 0

## 2021-12-07 LAB — MAGNESIUM: Magnesium: 1.6 mg/dL — ABNORMAL LOW (ref 1.7–2.4)

## 2021-12-07 MED ORDER — MAGNESIUM SULFATE 2 GM/50ML IV SOLN
2.0000 g | Freq: Once | INTRAVENOUS | Status: AC
  Administered 2021-12-07: 2 g via INTRAVENOUS
  Filled 2021-12-07: qty 50

## 2021-12-07 NOTE — Care Management Obs Status (Cosign Needed)
[facility] [facility] [facility] NOTIFICATION   Patient Details  Name: Shaunae Sieloff MRN: [id number] Date of Birth: 1931/08/22   Medicare Observation Status Notification Given:  Yes    Curlene Labrum, RN 12/07/2021, 11:21 AM

## 2021-12-07 NOTE — TOC Initial Note (Signed)
Transition of Care [facility]) - Initial/Assessment Note    Patient Details  Name: Kathy Velez MRN: [id number] Date of Birth: 11-Jul-1931  Transition of Care [facility]) CM/SW Contact:    Curlene Labrum, RN Phone Number: 12/07/2021, 11:55 AM  Clinical Narrative:                 CM met with the patient at the bedside to discuss transitions of care needs to home today - at [facility].  I spoke with the patient at bedside and daughter on the phone and discuss Medicare observation notice and document placed at the bedside.  The patient will be discharge back to [facility] today and bedside nursing is aware to call the patient's son-in-law listed on the facesheet for transportation to home.  I called and spoke with Orion Crook, RNCM at the [facility] facility and PT/OT will be provided through [facility], therapy department at the facility.  I placed home health orders for PT/OT and discharge summary and orders along with therapy notes will be faxed to [facility] at fax # [phone].  CM will continue to follow the patient for discharge needs to home.  Expected Discharge Plan: [place] Barriers to Discharge: No Barriers Identified   Patient Goals and CMS Choice Patient states their goals for this hospitalization and ongoing recovery are:: Return back to [facility] today CMS Medicare.gov Compare Post Acute Care list provided to:: Patient Choice offered to / list presented to : Adult Children  Expected Discharge Plan and Services Expected Discharge Plan: [place]   Discharge Planning Services: CM Consult     Expected Discharge Date: 12/07/21                         HH Arranged: PT, OT HH Agency: Other - See comment ([facility] Therapy department - through [facility].) Date [facility] Agency Contacted: 12/07/21 Time HH Agency Contacted: 22 Representative spoke with at [facility]: Orion Crook, RNCM  at [place] Arrangements/Services   Lives with:: Facility Resident (Lives at [facility]) Patient language and need for interpreter reviewed:: Yes Do you feel safe going back to the place where you live?: Yes      Need for Family Participation in Patient Care: Yes (Comment) Care giver support system in place?: Yes (comment) Current home services: DME ([facility]) Criminal Activity/Legal Involvement Pertinent to Current Situation/Hospitalization: No - Comment as needed  Activities of Daily Living Home Assistive Devices/Equipment: Environmental consultant (specify type), Hearing aid, Eyeglasses ADL Screening (condition at time of admission) Patient's cognitive ability adequate to safely complete daily activities?: No Is the patient deaf or have difficulty hearing?: Yes Does the patient have difficulty seeing, even when wearing glasses/contacts?: No Does the patient have difficulty concentrating, remembering, or making decisions?: Yes Patient able to express need for assistance with ADLs?: Yes Does the patient have difficulty dressing or bathing?: Yes Independently performs ADLs?: Yes (appropriate for developmental age) Does the patient have difficulty walking or climbing stairs?: Yes Weakness of Legs: None Weakness of Arms/Hands: None  Permission Sought/Granted Permission sought to share information with : Case Manager, Family Supports, Customer service manager Permission granted to share information with : Yes, Verbal Permission Granted     Permission granted to share info w AGENCY: [facility]  Permission granted to share info w Relationship: daughter Di Kindle - daughter - [phone]  Emotional Assessment Appearance:: Appears stated age Attitude/Demeanor/Rapport: Gracious Affect (typically observed): Accepting Orientation: : Oriented to Self, Oriented to Place, Oriented to  Time, Oriented to Situation Alcohol / Substance Use: Not Applicable Psych  Involvement: No (comment)  Admission diagnosis:  Acute blood loss anemia [D62] Scalp hematoma [S00.03XA] Traumatic hematoma [T14.8XXA] Patient Active Problem List   Diagnosis Date Noted   Scalp hematoma 12/06/2021   GERD without esophagitis 12/06/2021   Frequent falls 12/06/2021   Hyponatremia 12/06/2021   Aortic atherosclerosis ([place]) 07/26/2021   Major neurocognitive disorder, due to vascular disease, without behavioral disturbance, mild ([place]) 04/25/2021   Fecal incontinence 04/25/2021   Colon cancer ([place]) 04/25/2021   Periorbital hematoma of right eye 09/06/2020   Closed fracture of nasal bones 09/06/2020   Closed fracture of orbit ([place]) 09/06/2020   Retrobulbar hematoma 09/06/2020   Periorbital hematoma 09/06/2020   Psychophysiological insomnia 10/11/2019   DNR (do not resuscitate) 10/11/2019   Transient ischemic attack (TIA) 03/09/2019   Pain in right knee 02/12/2018   Osteopenia of forearm 12/11/2017   Primary osteoarthritis of both knees 12/11/2017   Permanent atrial fibrillation ([place]) 11/22/2017   Hypercoagulable state due to atrial fibrillation (HCC) 10/11/2017   Chronic atrial fibrillation ([place]) 10/11/2017   Diplopia 10/11/2017   Presbycusis of both ears 10/11/2017   Acquired hammertoes of both feet 10/11/2017   Macrocytosis without anemia 10/11/2017   Genu valgum, right 10/11/2017   History of multiple strokes 10/11/2017   Mild cognitive impairment with memory loss 10/11/2017   Overactive bladder 10/11/2017   Mixed hyperlipidemia 09/03/2017   History of colon cancer, stage III 01/17/2017   Carotid stenosis 12/13/2016   Chronic congestion of paranasal sinus 11/13/2016   Chronic seasonal allergic rhinitis 06/06/2016   Interstitial lung disease ([place]) 12/28/2015   Hemispheric carotid artery syndrome 07/18/2015   Hypothyroidism 05/05/2015   Rheumatoid arthritis ([place]) 05/05/2015   Chronic constipation 05/05/2015   Vitamin B12 deficiency 05/05/2015   Iron deficiency  anemia 05/02/2015   Back pain 05/02/2015   Preop cardiovascular exam    Atrial flutter ([place]) 04/13/2015   Status post mitral valve repair 04/13/2015   Status post tricuspid valve repair 04/13/2015   Spondylolisthesis of lumbar region 04/12/2015   Spinal stenosis of lumbar region 02/02/2015   RLS (restless legs syndrome) 11/10/2013   Essential hypertension 07/14/2013   Depression 07/02/2011   PCP:  Virgie Dad, MD Pharmacy:   [facility], [place] [address] Phone: [phone] Fax: [place], [place] [address] Phone: [phone] Fax: [place], [place] [address] [place] [address] Phone: [phone] Fax: [phone]     Social Determinants of Health (SDOH) Interventions    Readmission Risk Interventions     No data to display

## 2021-12-07 NOTE — Discharge Summary (Addendum)
Physician Discharge Summary   Patient: Kathy Velez MRN: [id number] DOB: Dec 21, 1931  Admit date:     12/06/2021  Discharge date: 12/07/21  Discharge Physician: Murray Hodgkins   PCP: Virgie Dad, MD   Recommendations at discharge:   Scalp hematoma.  Remove staples approximately 10 days after placement (placed 9/13) Apixaban held on discharge given significant bleeding.  Recommend outpatient risk/benefit discussion with patient to settle on whether or not long-term anticoagulation should be restarted given her advanced age, falls and life-threatening bleeding.  Discharge Diagnoses: Principal Problem:   Scalp hematoma Active Problems:   Chronic atrial fibrillation (HCC)   Essential hypertension   Frequent falls   Rheumatoid arthritis (HCC)   Hypothyroidism   Mixed hyperlipidemia   GERD without esophagitis   Hyponatremia   ABLA (acute blood loss anemia)  Resolved Problems:   * No resolved hospital problems. *  Hospital Course: 88 year old woman PMH includes atrial fibrillation on apixaban, colon cancer in remission, presented with bleeding from scalp, found to have large hematoma with arterial bleeding requiring acute intervention and associated with significant blood loss requiring PRBC transfusion. Surgery consulted for further recommendations, recommended conservative management and holding Eliquis.    Daughter updated by telephone including rationale for holding Eliquis in the short-term, she will follow-up with cardiologist and neurologist to discuss long-term risk/benefit  * Scalp hematoma with associated ABLA secondary to arterial bleed -- Initial event was closed head injury secondary to fall 7/30, seen in the ED at that time.  No apparent bleeding until approximately a week ago, unclear whether secondary to recurrent fall which occurred about a week ago with closed head injury but in a different site.  Complicated by ongoing administration of apixaban  presumably. --Significant bleed in the emergency department, arterial per documentation, requiring vessel tie off and staples with substantial blood loss documented. -- Hemoglobin stable status post 1 unit PRBC. --Continue to hold apixaban as the risk outweighs the benefit in the short-term.  Can consider resuming apixaban as an outpatient. -- Seen by trauma surgery with recommendation to leave staples in place for total of about 10 days, conservative management.  Surgery recommended holding Eliquis.   Chronic atrial fibrillation (HCC) --Given at least 2 falls in the last 6 weeks, life-threatening bleed from hematoma, careful consideration will need to be given in the outpatient setting as to risk versus benefit in this patient with history of atrial fibrillation and stroke.   -- Continue metoprolol.   Essential hypertension --Continue chronic medications.   Frequent falls --Reported frequent falls --Holding Eliquis for now.  --PT, OT evals recommended HH at ALF   Rheumatoid arthritis ([place]) --Continue home regimen of leflunomide   Hypothyroidism --Continue home regimen of Synthroid   Hyponatremia --Longstanding history of mild hyponatremia, presumably SIADH although this is not noted in the patient's chart --Baseline is typically in the low 130s --Continue daily salt tablets per home regimen       Consultants:  Trauma surgery   Procedures performed:  EDP .Marland KitchenLaceration Repair   Date/Time: 12/06/2021 3:44 AM   Performed by: Merryl Hacker, MD Authorized by: Merryl Hacker, MD   Consent:    Consent obtained:  Verbal   Consent given by:  Patient Anesthesia:    Anesthesia method:  Local infiltration   Local anesthetic:  Lidocaine 2% WITH epi Laceration details:    Location:  Scalp   Scalp location:  R parietal   Wound length (cm): Large hematoma with overlying skin break. Exploration:  Limited defect created (wound extended): no     Hemostasis achieved with:   Tied off vessels, epinephrine and direct pressure   Wound extent: vascular damage     Contaminated: yes   Treatment:    Irrigation solution:  Sterile saline   Irrigation volume:  2 L Skin repair:    Repair method:  Staples   Number of staples:  2 Repair type:    Repair type:  Complex Post-procedure details:    Dressing:  Bulky dressing   Procedure completion:  Tolerated Comments:     Large hematoma with overlying skin break, arterial bleed noted with copious bleeding at bedside, direct pressure was applied, figure-of-eight stitch with a 4-0 Vicryl was placed with good bleeding control.  Overlying 2 staples were placed.   Disposition:  return to ALF with HHPT, OT Diet recommendation:  Diet Orders (From admission, onward)     Start     Ordered   12/07/21 0000  Diet - low sodium heart healthy        12/07/21 1152   12/06/21 1344  Diet Heart Room service appropriate? No; Fluid consistency: Thin  Diet effective now       Question Answer Comment  Room service appropriate? No   Fluid consistency: Thin      12/06/21 1344            DISCHARGE MEDICATION: Allergies as of 12/07/2021       Reactions   Claritin [loratadine] Other (See Comments)   Listed on MAR Unknown reaction   Codeine Other (See Comments)   "just don't take it well"   Gabapentin Other (See Comments)   Dizziness    Molds & Smuts    Pollen Extract Swelling   Bee Venom Swelling, Rash   Wasp Venom Swelling, Rash        Medication List     STOP taking these medications    apixaban 2.5 MG Tabs tablet Commonly known as: Eliquis       TAKE these medications    atorvastatin 20 MG tablet Commonly known as: LIPITOR TAKE 1 TABLET DAILY What changed: when to take this   cyanocobalamin 1000 MCG tablet Commonly known as: VITAMIN B12 Take 1,000 mcg by mouth in the morning.   digoxin 0.125 MG tablet Commonly known as: LANOXIN TAKE 1 TABLET DAILY What changed: when to take this   ferrous sulfate  325 (65 FE) MG tablet Take 325 mg by mouth 2 (two) times daily.   Gemtesa 75 MG Tabs Generic drug: Vibegron Take 32.5 mg by mouth at bedtime. What changed: how much to take   hydrocortisone cream 1 % Apply 1 Application topically in the morning. To both palms   leflunomide 20 MG tablet Commonly known as: ARAVA Take 20 mg by mouth in the morning.   levothyroxine 88 MCG tablet Commonly known as: SYNTHROID TAKE 1 TABLET DAILY 30 MINUTES BEFORE BREAKFAST ON AN EMPTY STOMACHTAKE 1 TABLET DAILY 30 MINUTES BEFORE BREAKFAST ON AN EMPTY STOMACH What changed:  how much to take how to take this when to take this additional instructions   metoprolol succinate 50 MG 24 hr tablet Commonly known as: TOPROL-XL TAKE 1 TABLET DAILY What changed: when to take this   mirtazapine 15 MG tablet Commonly known as: REMERON TAKE 1 TABLET AT BEDTIME   multivitamin tablet Take 1 tablet by mouth in the morning.   omeprazole 40 MG capsule Commonly known as: PRILOSEC TAKE 1 CAPSULE DAILY What changed: when to  take this   PreserVision AREDS 2 Caps Take 1 capsule by mouth 2 (two) times daily.   sodium chloride 1 g tablet Take 1 g by mouth in the morning.   valsartan 80 MG tablet Commonly known as: DIOVAN Take 0.5 tablets (40 mg total) by mouth daily. What changed: when to take this   Vitamin D 50 MCG (2000 UT) Caps Take 2,000 Units by mouth in the morning.               Discharge Care Instructions  (From admission, onward)           Start     Ordered   12/07/21 0000  Discharge wound care:       Comments: Keep clean. Does not need to be covered. Take staples out 9/23 or 9/24.   12/07/21 1152            Follow-up Information     Virgie Dad, MD. Schedule an appointment as soon as possible for a visit.   Specialty: Internal Medicine Contact information: [place] [address] [phone]               [place] good Ready to return to  ALF  Discharge Exam: Filed Weights   12/06/21 0518 12/06/21 1252  Weight: 55.7 kg 55.5 kg   Physical Exam Vitals reviewed.  Constitutional:      General: She is not in acute distress.    Appearance: She is not ill-appearing or toxic-appearing.  Cardiovascular:     Rate and Rhythm: Normal rate and regular rhythm.     Heart sounds: No murmur heard. Pulmonary:     Effort: Pulmonary effort is normal. No respiratory distress.     Breath sounds: No wheezing, rhonchi or rales.  Skin:    Comments: Hematoma firm right parietal scalp, 2 staples visible, no bleeding noted, nontender  Neurological:     Mental Status: She is alert.  Psychiatric:        Behavior: Behavior normal.      Condition at discharge: good  The results of significant diagnostics from this hospitalization (including imaging, microbiology, ancillary and laboratory) are listed below for reference.   Imaging Studies: CT Head Wo Contrast  Result Date: 12/06/2021 CLINICAL DATA:  Bleeding wound EXAM: CT HEAD WITHOUT CONTRAST TECHNIQUE: Contiguous axial images were obtained from the base of the skull through the vertex without intravenous contrast. RADIATION DOSE REDUCTION: This exam was performed according to the departmental dose-optimization program which includes automated exposure control, adjustment of the mA and/or kV according to patient size and/or use of iterative reconstruction technique. COMPARISON:  10/27/2021 FINDINGS: Brain: No evidence of acute infarction, hemorrhage, hydrocephalus, extra-axial collection or mass lesion/mass effect. Subcortical white matter and periventricular small vessel ischemic changes. Vascular: Intracranial atherosclerosis. Skull: Normal. Negative for fracture or focal lesion. Sinuses/Orbits: The visualized paranasal sinuses are essentially clear. The mastoid air cells are unopacified. Other: Small extracranial hematoma overlying the right parietal bone (series 3/image 32). IMPRESSION: Small  extracranial hematoma overlying the right parietal bone. No evidence of calvarial fracture. No acute intracranial abnormality. Small vessel ischemic changes. Electronically Signed   By: Julian Hy M.D.   On: 12/06/2021 02:01    Microbiology: Results for orders placed or performed during the hospital encounter of 10/27/21  Urine Culture     Status: Abnormal   Collection Time: 10/27/21  1:20 PM   Specimen: Urine, Clean Catch  Result Value Ref Range Status   Specimen Description  Final    URINE, CLEAN CATCH Performed at [facility], [address] [place], [place] [address]    Special Requests   Final    NONE Performed at [facility], [address], [place],  [address]    Culture >=100,000 COLONIES/mL ESCHERICHIA COLI (A)  Final   Report Status 10/29/2021 FINAL  Final   Organism ID, Bacteria ESCHERICHIA COLI (A)  Final      Susceptibility   Escherichia coli - MIC*    AMPICILLIN >=32 RESISTANT Resistant     CEFAZOLIN <=4 SENSITIVE Sensitive     CEFEPIME <=0.12 SENSITIVE Sensitive     CEFTRIAXONE <=0.25 SENSITIVE Sensitive     CIPROFLOXACIN <=0.25 SENSITIVE Sensitive     GENTAMICIN <=1 SENSITIVE Sensitive     IMIPENEM <=0.25 SENSITIVE Sensitive     NITROFURANTOIN <=16 SENSITIVE Sensitive     TRIMETH/SULFA <=20 SENSITIVE Sensitive     AMPICILLIN/SULBACTAM 16 INTERMEDIATE Intermediate     PIP/TAZO <=4 SENSITIVE Sensitive     * >=100,000 COLONIES/mL ESCHERICHIA COLI    Labs: CBC: Recent Labs  Lab 12/06/21 0211 12/06/21 0313 12/06/21 0520 12/06/21 1109 12/07/21 0548  WBC 9.9  --   --  9.3 7.2  NEUTROABS 4.1  --   --   --  4.0  HGB 8.5* 7.0* 8.2* 8.8* 8.7*  HCT 25.3* 20.7* 25.1* 26.2* 25.0*  MCV 101.2*  --   --  101.6* 98.4  PLT 186  --   --  136* 347*   Basic Metabolic Panel: Recent Labs  Lab 12/06/21 0211 12/07/21 0548  NA 132* 137  K 3.5 4.2  CL 100 104  CO2 22 23  GLUCOSE 157* 90  BUN 21 15  CREATININE  0.90 0.79  CALCIUM 8.5* 9.1  MG  --  1.6*   Liver Function Tests: No results for input(s): "AST", "ALT", "ALKPHOS", "BILITOT", "PROT", "ALBUMIN" in the last 168 hours. CBG: No results for input(s): "GLUCAP" in the last 168 hours.  Discharge time spent: greater than 30 minutes.  Signed: Murray Hodgkins, MD [facility] 12/07/2021

## 2021-12-07 NOTE — Progress Notes (Signed)
Subjective: No complaints this morning.  Sitting up in her chair, dressed and ready to leave.    ROS: See above, otherwise other systems negative  Objective: Vital signs in last 24 hours: Temp:  [97.6 F (36.4 C)-98.5 F (36.9 C)] 98.5 F (36.9 C) (09/14 0900) Pulse Rate:  [77-105] 93 (09/14 0900) Resp:  [16-22] 19 (09/14 0900) BP: (129-171)/(73-97) 166/97 (09/14 0900) SpO2:  [96 %-100 %] 96 % (09/14 0900) Weight:  [55.5 kg] 55.5 kg (09/13 1252)    Intake/Output from previous day: 09/13 0701 - 09/14 0700 In: 715 [P.O.:240; I.V.:140; Blood:335] Out: 0  Intake/Output this shift: Total I/O In: 240 [P.O.:240] Out: -   PE: Gen: NAD HEENT: hematoma to right parietal scalp with significant dried blood.  No bleeding or oozing note.  2 staples noted. Psych: A&Ox3  Lab Results:  Recent Labs    12/06/21 1109 12/07/21 0548  WBC 9.3 7.2  HGB 8.8* 8.7*  HCT 26.2* 25.0*  PLT 136* 141*   BMET Recent Labs    12/06/21 0211 12/07/21 0548  NA 132* 137  K 3.5 4.2  CL 100 104  CO2 22 23  GLUCOSE 157* 90  BUN 21 15  CREATININE 0.90 0.79  CALCIUM 8.5* 9.1   PT/INR Recent Labs    12/06/21 0823  LABPROT 15.5*  INR 1.2   CMP     Component Value Date/Time   NA 137 12/07/2021 0548   NA 135 (A) 11/23/2021 0000   K 4.2 12/07/2021 0548   CL 104 12/07/2021 0548   CO2 23 12/07/2021 0548   GLUCOSE 90 12/07/2021 0548   BUN 15 12/07/2021 0548   BUN 17 11/23/2021 0000   CREATININE 0.79 12/07/2021 0548   CREATININE 0.79 10/03/2021 1055   CALCIUM 9.1 12/07/2021 0548   PROT 7.1 10/03/2021 1055   ALBUMIN 4.5 10/03/2021 1055   AST 22 10/03/2021 1055   ALT 18 10/03/2021 1055   ALKPHOS 75 10/03/2021 1055   BILITOT 0.8 10/03/2021 1055   GFRNONAA >60 12/07/2021 0548   GFRNONAA >60 10/03/2021 1055   GFRAA 79.35 02/02/2021 0000   Lipase  No results found for: "LIPASE"     Studies/Results: CT Head Wo Contrast  Result Date: 12/06/2021 CLINICAL DATA:  Bleeding  wound EXAM: CT HEAD WITHOUT CONTRAST TECHNIQUE: Contiguous axial images were obtained from the base of the skull through the vertex without intravenous contrast. RADIATION DOSE REDUCTION: This exam was performed according to the departmental dose-optimization program which includes automated exposure control, adjustment of the mA and/or kV according to patient size and/or use of iterative reconstruction technique. COMPARISON:  10/27/2021 FINDINGS: Brain: No evidence of acute infarction, hemorrhage, hydrocephalus, extra-axial collection or mass lesion/mass effect. Subcortical white matter and periventricular small vessel ischemic changes. Vascular: Intracranial atherosclerosis. Skull: Normal. Negative for fracture or focal lesion. Sinuses/Orbits: The visualized paranasal sinuses are essentially clear. The mastoid air cells are unopacified. Other: Small extracranial hematoma overlying the right parietal bone (series 3/image 32). IMPRESSION: Small extracranial hematoma overlying the right parietal bone. No evidence of calvarial fracture. No acute intracranial abnormality. Small vessel ischemic changes. Electronically Signed   By: Julian Hy M.D.   On: 12/06/2021 02:01    Anti-infectives: Anti-infectives (From admission, onward)    None        Assessment/Plan History of multiple falls Scalp hematoma with arterial bleed - s/p suture/staple repair in ED 9/13 with hemostasis achieved. Continue staples for about 10 days. Ice, pain control.  May follow up with PCP for staple removal and to discuss continuation of eliquis ABL anemia secondary to scalp hematoma - s/p 1u PRBCs. Hold eliquis and trend h/h Chronic A fib on eliquis - continue holding eliquis. Consider holding indefinitely given that the patient is elderly, DNR, and has a h/o multiple falls HTN Rheumatoid arthritis Hypothyroidism Chronic hyponatremia Hx TIA Code status DNR   FEN - heart healthy diet VTE - SCDs ID - none Foley -  none  Stable for DC home from our standpoint.  D/w primary service.  Will need PCP follow up for staple removal and for discussion on risk vs benefit of discontinuation of eliquis long-term.  I reviewed hospitalist notes, last 24 h vitals and pain scores, last 48 h intake and output, last 24 h labs and trends, and last 24 h imaging results.   LOS: 0 days    Henreitta Cea , [facility] 12/07/2021, 11:53 AM Please see Amion for pager number during day hours 7:00am-4:30pm or 7:00am -11:30am on weekends

## 2021-12-07 NOTE — Discharge Instructions (Signed)
Have primary care physician remove staples on or around 9/22.

## 2021-12-08 ENCOUNTER — Telehealth: Payer: Self-pay | Admitting: Internal Medicine

## 2021-12-08 NOTE — Telephone Encounter (Signed)
Called patient daughter.  She states was in the hospital on 09/13 (notes in epic) surgery team recommended she stop Eliquis, with no plans to restart. Her daughter states she would like to check with Dr.Hilty and have his recommendations on this as well.   Will route to MD to review.  Thanks!

## 2021-12-08 NOTE — Telephone Encounter (Signed)
Pt c/o medication issue:  1. Name of Medication: Eliquis  2. How are you currently taking this medication (dosage and times per day)?   3. Are you having a reaction (difficulty breathing--STAT)? NO  4. What is your medication issue? Daughter states that the hospital took pt off medication and wants her to be off of it permanently. Daughter would like a callback regarding this matter. Please advise

## 2021-12-08 NOTE — Telephone Encounter (Signed)
Called patient spoke with daughter, advised of message below.   Got patient scheduled with Jory Sims, NP next week 09/22 at 2:45 PM. Patient daughter verbalized understanding.

## 2021-12-11 ENCOUNTER — Inpatient Hospital Stay: Payer: Medicare Other

## 2021-12-11 ENCOUNTER — Encounter: Payer: Self-pay | Admitting: *Deleted

## 2021-12-11 ENCOUNTER — Inpatient Hospital Stay: Payer: Medicare Other | Admitting: Oncology

## 2021-12-11 ENCOUNTER — Encounter: Payer: Self-pay | Admitting: Oncology

## 2021-12-11 NOTE — Progress Notes (Signed)
Daughter called to cancel appointments today. She is unable to bring [name]. Scheduling message sent to reschedule for October.

## 2021-12-12 ENCOUNTER — Non-Acute Institutional Stay: Payer: Medicare Other | Admitting: Internal Medicine

## 2021-12-12 ENCOUNTER — Encounter: Payer: Self-pay | Admitting: Internal Medicine

## 2021-12-12 DIAGNOSIS — I482 Chronic atrial fibrillation, unspecified: Secondary | ICD-10-CM

## 2021-12-12 DIAGNOSIS — G3184 Mild cognitive impairment, so stated: Secondary | ICD-10-CM | POA: Diagnosis not present

## 2021-12-12 DIAGNOSIS — C185 Malignant neoplasm of splenic flexure: Secondary | ICD-10-CM | POA: Diagnosis not present

## 2021-12-12 DIAGNOSIS — M6389 Disorders of muscle in diseases classified elsewhere, multiple sites: Secondary | ICD-10-CM | POA: Diagnosis not present

## 2021-12-12 DIAGNOSIS — R296 Repeated falls: Secondary | ICD-10-CM | POA: Diagnosis not present

## 2021-12-12 DIAGNOSIS — R278 Other lack of coordination: Secondary | ICD-10-CM | POA: Diagnosis not present

## 2021-12-12 DIAGNOSIS — D500 Iron deficiency anemia secondary to blood loss (chronic): Secondary | ICD-10-CM | POA: Diagnosis not present

## 2021-12-12 DIAGNOSIS — S0003XD Contusion of scalp, subsequent encounter: Secondary | ICD-10-CM

## 2021-12-12 DIAGNOSIS — N3000 Acute cystitis without hematuria: Secondary | ICD-10-CM | POA: Diagnosis not present

## 2021-12-12 NOTE — Progress Notes (Unsigned)
Location:  [place] Room Number: 213-Y Place of Service:  [facility] [phone]) Provider:  Virgie Dad, MD  Patient Care Team: Virgie Dad, MD as PCP - General (Internal Medicine) Pixie Casino, MD as PCP - Cardiology (Cardiology) Pieter Partridge, DO as Consulting Physician (Neurology)  Extended Emergency Contact Information Primary Emergency Contact: [name],[name] Address: [place], [place] [address] Johnnette Litter of [place] Phone: [phone] Mobile Phone: [phone] Relation: Daughter Secondary Emergency Contact: Eula Listen Address: [address]          [place], [place] [address] Johnnette Litter of [place] Mobile Phone: [phone] Relation: Son  Code Status:  DNR Goals of care: Advanced Directive information    12/12/2021    2:53 PM  Advanced Directives  Does Patient Have a Medical Advance Directive? Yes  Type of Paramedic of [place];Living will;Out of facility DNR (pink MOST or yellow form)  Does patient want to make changes to medical advance directive? No - Patient declined  Copy of [place] in Chart? Yes - validated most recent copy scanned in chart (See row information)     Chief Complaint  Patient presents with   Acute Visit    Patient presents with bleeding from head wound     HPI:  Pt is a 88 y.o. female seen today for an acute visit for Bleeding from head wound  Lives in [place]  Was send to ED to ED on 09/13 for acute Bleeding from her Chronic Scalp hematoma Admitted with HGB of 7 and Profuse bleeding from her wound She has 2 sataples plced fo rhemostasis Eliquis was held  Send back to [facility] Now have area I-n the hematoma which is bleeding again Also have some odor to it But no Pain or tender       Patient has h/o  Colon Cancer with Anemia Treated with Immunotherapy Recent Colonoscopy showed Cancer resolved fornow Hypothyroidism H/o  ILD Rheumatoid arthritis Taken off embrel for immunotherapy Noe only on Arava Fecal Incontinence  MCI Last MMSE 20/30 MRI showed Cerebral Amyloid Angiopsthy    Past Medical History:  Diagnosis Date   Allergy    seasonal   Anemia    Anxiety    Arthritis    Back   Atrial fibrillation ([place])    Cancer ([place]) 2006   Colon.  Basal Cell Skin cancer- right arm   Cataract    removed bilateral   Colon cancer ([facility])    Constipation due to pain medication therapy    after heart surgery   Dysrhythmia    PAF   GERD (gastroesophageal reflux disease)    Heart murmur    Hyperlipidemia    Hypertension    Hypothyroidism    RA (rheumatoid arthritis) ([place])    Restless leg    Seizures ([place])    after Heart Surgey   Stroke ([place])    TIA- found by neurologist after    Past Surgical History:  Procedure Laterality Date   ABDOMINAL HYSTERECTOMY  1970   Partial    COLON RESECTION  2006   cancer   COLON SURGERY     COLONOSCOPY     EYE SURGERY Bilateral    Cataract   MAXIMUM ACCESS (MAS)POSTERIOR LUMBAR INTERBODY FUSION (PLIF) 2 LEVEL N/A 04/12/2015   Procedure: Lumbar Three-Five Decompression, Pedicle Screw Fixation, and Posteriolateral Arthrodesis;  Surgeon: Erline Levine, MD;  Location: [facility];  Service: Neurosurgery;  Laterality: N/A;  L3-4 L4-5 Maximum access posterior lumbar fusion, possible interbodies and resection of synovial cyst at L4-5   MITRAL VALVE REPAIR  01/20/2013   Gore-tex cords to P1, P2, and P3. Magic suture to posterior medial commisure, #30 Physio 1 ring. Done in [place]   TONSILLECTOMY     about [place]  01/20/2013   #28 TriAd ring done in [place]    Allergies  Allergen Reactions   Claritin [Loratadine] Other (See Comments)    Listed on MAR Unknown reaction   Codeine Other (See Comments)    "just don't take it well"   Gabapentin Other (See Comments)    Dizziness    Molds & Smuts    Pollen Extract Swelling   Bee Venom Swelling and  Rash   Wasp Venom Swelling and Rash    Outpatient Encounter Medications as of 12/12/2021  Medication Sig   amoxicillin (AMOXIL) 500 MG capsule Take 500 mg by mouth daily as needed.   apixaban (ELIQUIS) 2.5 MG TABS tablet Take 2.5 mg by mouth 2 (two) times daily.   atorvastatin (LIPITOR) 20 MG tablet TAKE 1 TABLET DAILY   Cholecalciferol (VITAMIN D) 50 MCG (2000 UT) CAPS Take 2,000 Units by mouth in the morning.   digoxin (LANOXIN) 0.125 MG tablet TAKE 1 TABLET DAILY   ferrous sulfate 325 (65 FE) MG tablet Take 325 mg by mouth 2 (two) times daily.   hydrocortisone cream 1 % Apply 1 Application topically in the morning. To both palms   leflunomide (ARAVA) 20 MG tablet Take 20 mg by mouth in the morning.   levothyroxine (SYNTHROID) 88 MCG tablet TAKE 1 TABLET DAILY 30 MINUTES BEFORE BREAKFAST ON AN EMPTY STOMACHTAKE 1 TABLET DAILY 30 MINUTES BEFORE BREAKFAST ON AN EMPTY STOMACH   metoprolol succinate (TOPROL-XL) 50 MG 24 hr tablet TAKE 1 TABLET DAILY   mirtazapine (REMERON) 15 MG tablet TAKE 1 TABLET AT BEDTIME   Multiple Vitamin (MULTIVITAMIN) tablet Take 1 tablet by mouth in the morning.   Multiple Vitamins-Minerals (PRESERVISION AREDS 2) CAPS Take 1 capsule by mouth 2 (two) times daily.   omeprazole (PRILOSEC) 40 MG capsule TAKE 1 CAPSULE DAILY   sodium chloride 1 g tablet Take 1 g by mouth in the morning.   valsartan (DIOVAN) 80 MG tablet Take 0.5 tablets (40 mg total) by mouth daily.   Vibegron (GEMTESA) 75 MG TABS Take 32.5 mg by mouth at bedtime.   vitamin B-12 (CYANOCOBALAMIN) 1000 MCG tablet Take 1,000 mcg by mouth in the morning.   No facility-administered encounter medications on file as of 12/12/2021.    Review of Systems  Constitutional:  Negative for activity change and appetite change.  HENT: Negative.    Respiratory:  Negative for cough and shortness of breath.   Cardiovascular:  Negative for leg swelling.  Gastrointestinal:  Negative for constipation.  Genitourinary:  Negative.   Musculoskeletal:  Negative for arthralgias, gait problem and myalgias.  Skin:  Positive for wound.  Neurological:  Positive for weakness. Negative for dizziness.  Psychiatric/Behavioral:  Negative for confusion, dysphoric mood and sleep disturbance.     Immunization History  Administered Date(s) Administered   Fluad Quad(high Dose 65+) 01/05/2019, 12/07/2021   H1N1 04/07/2008   Influenza Whole 12/29/2008, 12/22/2009   Influenza, High Dose Seasonal PF 01/15/2012, 12/23/2014, 01/22/2020   Influenza,inj,Quad PF,6+ Mos 01/22/2014, 12/10/2016, 01/17/2018   Influenza,trivalent, recombinat, inj, PF 01/05/2016   Influenza-Unspecified 04/07/2008, 12/15/2014, 01/13/2021   Moderna Sars-Covid-2  Vaccination 04/07/2019, 05/05/2019, 02/09/2020   PFIZER(Purple Top)SARS-COV-2 Vaccination 01/06/2021   PPD Test 07/17/2010, 04/19/2015, 04/25/2015   Pneumococcal Conjugate-13 11/10/2013   Pneumococcal Polysaccharide-23 12/27/2010, 12/05/2016   Tdap 04/15/2017   Zoster Recombinat (Shingrix) 04/09/2017, 06/18/2017   Pertinent  Health Maintenance Due  Topic Date Due   INFLUENZA VACCINE  Completed   DEXA SCAN  Completed      10/27/2021   11:31 AM 12/06/2021    1:11 AM 12/06/2021    5:19 AM 12/06/2021    1:00 PM 12/07/2021    7:51 AM  Fall Risk  Patient Fall Risk Level High fall risk Moderate fall risk High fall risk High fall risk High fall risk   Functional Status Survey:    Vitals:   12/12/21 1444  BP: (!) 142/92  Pulse: 97  Temp: 97.9 F (36.6 C)  Weight: 119 lb (54 kg)  Height: '5\' 2"'$  (1.575 m)   Body mass index is 21.77 kg/m. Physical Exam Vitals reviewed.  Constitutional:      Appearance: Normal appearance.  HENT:     Head: Normocephalic.     Nose: Nose normal.     Mouth/Throat:     Mouth: Mucous membranes are moist.     Pharynx: Oropharynx is clear.  Eyes:     Pupils: Pupils are equal, round, and reactive to light.  Cardiovascular:     Rate and Rhythm: Normal rate  and regular rhythm.     Pulses: Normal pulses.     Heart sounds: Normal heart sounds. No murmur heard. Pulmonary:     Effort: Pulmonary effort is normal.     Breath sounds: Normal breath sounds.  Abdominal:     General: Abdomen is flat. Bowel sounds are normal.     Palpations: Abdomen is soft.  Musculoskeletal:        General: No swelling.     Cervical back: Neck supple.  Skin:    General: Skin is warm.     Comments: Has a small Opening on her Chronic Hematoma which has some blood oozing But no Pain or tenderness The oozing did stop with pressure  Neurological:     General: No focal deficit present.     Mental Status: She is alert.  Psychiatric:        Mood and Affect: Mood normal.        Thought Content: Thought content normal.     Labs reviewed: Recent Labs    10/27/21 1320 10/30/21 0900 11/23/21 0000 12/06/21 0211 12/07/21 0548  NA 126*   < > 135* 132* 137  K 4.5   < > 4.4 3.5 4.2  CL 95*   < > 100 100 104  CO2 21*   < > 24* 22 23  GLUCOSE 103*  --   --  157* 90  BUN 15   < > '17 21 15  '$ CREATININE 0.71   < > 0.7 0.90 0.79  CALCIUM 8.9   < > 8.9 8.5* 9.1  MG  --   --   --   --  1.6*   < > = values in this interval not displayed.   Recent Labs    07/25/21 0945 08/22/21 1134 10/03/21 1055  AST '20 22 22  '$ ALT '18 20 18  '$ ALKPHOS 86 84 75  BILITOT 0.6 0.6 0.8  PROT 6.8 7.5 7.1  ALBUMIN 4.3 4.5 4.5   Recent Labs    10/27/21 1320 10/30/21 0900 12/06/21 0211 12/06/21 0313 12/06/21 0520 12/06/21 1109  12/07/21 0548  WBC 10.0   < > 9.9  --   --  9.3 7.2  NEUTROABS 7.4  --  4.1  --   --   --  4.0  HGB 11.6*   < > 8.5*   < > 8.2* 8.8* 8.7*  HCT 34.6*   < > 25.3*   < > 25.1* 26.2* 25.0*  MCV 99.1  --  101.2*  --   --  101.6* 98.4  PLT 186   < > 186  --   --  136* 141*   < > = values in this interval not displayed.   Lab Results  Component Value Date   TSH 17.80 (A) 11/23/2021   Lab Results  Component Value Date   HGBA1C 5.5 01/12/2013   Lab Results   Component Value Date   CHOL 115 02/02/2021   HDL 48 02/02/2021   LDLCALC 49 02/02/2021   TRIG 91 02/02/2021    Significant Diagnostic Results in last 30 days:  CT Head Wo Contrast  Result Date: 12/06/2021 CLINICAL DATA:  Bleeding wound EXAM: CT HEAD WITHOUT CONTRAST TECHNIQUE: Contiguous axial images were obtained from the base of the skull through the vertex without intravenous contrast. RADIATION DOSE REDUCTION: This exam was performed according to the departmental dose-optimization program which includes automated exposure control, adjustment of the mA and/or kV according to patient size and/or use of iterative reconstruction technique. COMPARISON:  10/27/2021 FINDINGS: Brain: No evidence of acute infarction, hemorrhage, hydrocephalus, extra-axial collection or mass lesion/mass effect. Subcortical white matter and periventricular small vessel ischemic changes. Vascular: Intracranial atherosclerosis. Skull: Normal. Negative for fracture or focal lesion. Sinuses/Orbits: The visualized paranasal sinuses are essentially clear. The mastoid air cells are unopacified. Other: Small extracranial hematoma overlying the right parietal bone (series 3/image 32). IMPRESSION: Small extracranial hematoma overlying the right parietal bone. No evidence of calvarial fracture. No acute intracranial abnormality. Small vessel ischemic changes. Electronically Signed   By: Julian Hy M.D.   On: 12/06/2021 02:01    Assessment/Plan Hematoma of scalp, subsequent encounter Will use Silver alginate QD to help with hemostasis Unfortunatley dont have naything more potenet then that Pressure dressing  If Bleeds more send to ED for hemoatsis Iron deficiency anemia due to chronic blood loss Check CBC On Iron Chronic atrial fibrillation ([place]) Off Eliquis Has appointment with Cardiolgy fo rtheir opinion about keeping her off Eliquis Malignant neoplasm of splenic flexure ([place])    Family/ staff Communication:  ***  Labs/tests ordered:  ***

## 2021-12-13 DIAGNOSIS — D649 Anemia, unspecified: Secondary | ICD-10-CM | POA: Diagnosis not present

## 2021-12-13 LAB — CBC AND DIFFERENTIAL
HCT: 25 — AB (ref 36–46)
Hemoglobin: 8.4 — AB (ref 12.0–16.0)
Platelets: 229 10*3/uL (ref 150–400)
WBC: 6.8

## 2021-12-13 LAB — CBC: RBC: 2.45 — AB (ref 3.87–5.11)

## 2021-12-14 ENCOUNTER — Non-Acute Institutional Stay: Payer: Medicare Other | Admitting: Adult Health

## 2021-12-14 ENCOUNTER — Encounter: Payer: Self-pay | Admitting: Adult Health

## 2021-12-14 DIAGNOSIS — D500 Iron deficiency anemia secondary to blood loss (chronic): Secondary | ICD-10-CM

## 2021-12-14 DIAGNOSIS — M6389 Disorders of muscle in diseases classified elsewhere, multiple sites: Secondary | ICD-10-CM | POA: Diagnosis not present

## 2021-12-14 DIAGNOSIS — I10 Essential (primary) hypertension: Secondary | ICD-10-CM | POA: Diagnosis not present

## 2021-12-14 DIAGNOSIS — I482 Chronic atrial fibrillation, unspecified: Secondary | ICD-10-CM | POA: Diagnosis not present

## 2021-12-14 DIAGNOSIS — S0003XD Contusion of scalp, subsequent encounter: Secondary | ICD-10-CM

## 2021-12-14 DIAGNOSIS — R278 Other lack of coordination: Secondary | ICD-10-CM | POA: Diagnosis not present

## 2021-12-14 DIAGNOSIS — N3000 Acute cystitis without hematuria: Secondary | ICD-10-CM | POA: Diagnosis not present

## 2021-12-14 DIAGNOSIS — R296 Repeated falls: Secondary | ICD-10-CM | POA: Diagnosis not present

## 2021-12-14 DIAGNOSIS — G3184 Mild cognitive impairment, so stated: Secondary | ICD-10-CM | POA: Diagnosis not present

## 2021-12-14 NOTE — Progress Notes (Signed)
Cardiology Clinic Note   Patient Name: Kathy Velez Date of Encounter: 12/15/2021  Primary Care Provider:  Virgie Dad, MD Primary Cardiologist:  Pixie Casino, MD  Patient Profile    88 year old female  PAF on colon cancer in remission. Recent admission after bleeding requiring admission for bleeding from the scalp, found to have large hematoma with arterial bleeding requiring acute intervention and associated with significant blood loss requiring PRBC transfusion after a fall.  She has had 2 falls in the prior 6 weeks to admission.   After sustaining a mechanical fall in July 2023, the patient had a large hematoma along with laceration requiring staples.  She was readmitted on 12/06/2021 after scalp incision began to bleed with large hematoma increasing.  At this point she did require the blood transfusion, and apixaban was held.  Since that time she has moved from her apartment to assisted living at [facility] where she is checked on every 2 hours.  She is seen by primary care provider Dr. Lyndel Safe every couple of months along with her neurologist, and hematologist.  She has had no further bleeding, falls, or dizziness.  Other history of mild hyponatremia presumably SIADH baseline 130, hypothyroidism, RA, iron deficiency anemia, hypertension, mixed HL.   Past Medical History    Past Medical History:  Diagnosis Date   Allergy    seasonal   Anemia    Anxiety    Arthritis    Back   Atrial fibrillation ([place])    Cancer ([place]) 2006   Colon.  Basal Cell Skin cancer- right arm   Cataract    removed bilateral   Colon cancer ([facility])    Constipation due to pain medication therapy    after heart surgery   Dysrhythmia    PAF   GERD (gastroesophageal reflux disease)    Heart murmur    Hyperlipidemia    Hypertension    Hypothyroidism    RA (rheumatoid arthritis) ([place])    Restless leg    Seizures ([place])    after Heart Surgey   Stroke ([place])    TIA- found by neurologist after    Past  Surgical History:  Procedure Laterality Date   ABDOMINAL HYSTERECTOMY  1970   Partial    COLON RESECTION  2006   cancer   COLON SURGERY     COLONOSCOPY     EYE SURGERY Bilateral    Cataract   MAXIMUM ACCESS (MAS)POSTERIOR LUMBAR INTERBODY FUSION (PLIF) 2 LEVEL N/A 04/12/2015   Procedure: Lumbar Three-Five Decompression, Pedicle Screw Fixation, and Posteriolateral Arthrodesis;  Surgeon: Erline Levine, MD;  Location: [facility];  Service: Neurosurgery;  Laterality: N/A;  L3-4 L4-5 Maximum access posterior lumbar fusion, possible interbodies and resection of synovial cyst at L4-5   MITRAL VALVE REPAIR  01/20/2013   Gore-tex cords to P1, P2, and P3. Magic suture to posterior medial commisure, #30 Physio 1 ring. Done in [place]   TONSILLECTOMY     about [place]  01/20/2013   #28 TriAd ring done in [place]    Allergies  Allergies  Allergen Reactions   Claritin [Loratadine] Other (See Comments)    Listed on MAR Unknown reaction   Codeine Other (See Comments)    "just don't take it well"   Gabapentin Other (See Comments)    Dizziness    Molds & Smuts    Pollen Extract Swelling   Bee Venom Swelling and Rash   Wasp Venom Swelling and Rash  History of Present Illness   Kathy Velez presents today post hospitalization, with her daughter Hosie Spangle, in attendance.    After sustaining a mechanical fall in July 2023, the patient had a large hematoma along with laceration requiring staples.  She was readmitted on 12/06/2021 after scalp incision began to bleed with large hematoma increasing.  At this point she required the blood transfusion, and apixaban was held.  Since that time she has moved from her apartment to assisted living at [facility] where she is checked on every 2 hours.  She is seen by primary care provider Dr. Lyndel Safe every couple of months along with her neurologist, and hematologist.  She has had no further bleeding, falls, or dizziness.  Her daughter is  concerned about being off of Eliquis as she continues to be in atrial fibrillation.  She worries about a stroke risk.   Home Medications    Current Outpatient Medications  Medication Sig Dispense Refill   amoxicillin (AMOXIL) 500 MG capsule Take 500 mg by mouth daily as needed.     atorvastatin (LIPITOR) 20 MG tablet TAKE 1 TABLET DAILY 90 tablet 3   Cholecalciferol (VITAMIN D) 50 MCG (2000 UT) CAPS Take 2,000 Units by mouth in the morning.     digoxin (LANOXIN) 0.125 MG tablet TAKE 1 TABLET DAILY 90 tablet 3   ferrous sulfate 325 (65 FE) MG tablet Take 325 mg by mouth 2 (two) times daily.     hydrocortisone cream 1 % Apply 1 Application topically in the morning. To both palms     leflunomide (ARAVA) 20 MG tablet Take 20 mg by mouth in the morning.     levothyroxine (SYNTHROID) 88 MCG tablet TAKE 1 TABLET DAILY 30 MINUTES BEFORE BREAKFAST ON AN EMPTY STOMACHTAKE 1 TABLET DAILY 30 MINUTES BEFORE BREAKFAST ON AN EMPTY STOMACH 30 tablet 0   metoprolol succinate (TOPROL-XL) 50 MG 24 hr tablet TAKE 1 TABLET DAILY 90 tablet 3   mirtazapine (REMERON) 15 MG tablet TAKE 1 TABLET AT BEDTIME 90 tablet 3   Multiple Vitamin (MULTIVITAMIN) tablet Take 1 tablet by mouth in the morning.     Multiple Vitamins-Minerals (PRESERVISION AREDS 2) CAPS Take 1 capsule by mouth 2 (two) times daily.     omeprazole (PRILOSEC) 40 MG capsule TAKE 1 CAPSULE DAILY 90 capsule 3   sodium chloride 1 g tablet Take 1 g by mouth in the morning.     valsartan (DIOVAN) 80 MG tablet Take 0.5 tablets (40 mg total) by mouth daily. 15 tablet 0   Vibegron (GEMTESA) 75 MG TABS Take 32.5 mg by mouth at bedtime. 30 tablet 5   vitamin B-12 (CYANOCOBALAMIN) 1000 MCG tablet Take 1,000 mcg by mouth in the morning.     No current facility-administered medications for this visit.     Family History    Family History  Problem Relation Age of Onset   Lung disease Mother 71   Heart disease Father 72   Colon polyps Daughter    Alcohol  abuse Son    Colon cancer Neg Hx    Stomach cancer Neg Hx    Rectal cancer Neg Hx    Crohn's disease Neg Hx    Esophageal cancer Neg Hx    She indicated that her mother is deceased. She indicated that her father is deceased. She indicated that her daughter is alive. She indicated that her son is deceased. She indicated that the status of her neg hx is unknown.  Social History  Social History   Socioeconomic History   Marital status: Widowed    Spouse name: Not on file   Number of children: Not on file   Years of education: Not on file   Highest education level: Not on file  Occupational History   Occupation: elementary school teacher    Comment: retired   Tobacco Use   Smoking status: Never    Passive exposure: Past ("mother smoked")   Smokeless tobacco: Never  Vaping Use   Vaping Use: Never used  Substance and Sexual Activity   Alcohol use: Yes    Alcohol/week: 1.0 standard drink of alcohol    Types: 1 Glasses of wine per week    Comment: 1-2 per week   Drug use: No   Sexual activity: Not Currently  Other Topics Concern   Not on file  Social History Narrative   Social History      Diet? Healthy- low salt, sugar, fat      Do you drink/eat things with caffeine? On occasion      Marital status?        widow                        What year were you married? 1958      Do you live in a house, apartment, assisted living, condo, trailer, etc.? apartment      Is it one or more stories? one      How many persons live in your home? one      Do you have any pets in your home? (please list) no      Highest level of education completed? 4 year college      Current or past profession: Statistician      Do you exercise?             yes                         Type & how often? Classes- senior retirement community/ some walking      Advanced Directives      Do you have a living will?      Do you have a DNR form?                                  If not, do you  want to discuss one?      Do you have signed POA/HPOA for forms?       Functional Status Completed by: Daughter, Di Kindle      Do you have difficulty bathing or dressing yourself? no      Do you have difficulty preparing food or eating? no      Do you have difficulty managing your medications? no      Do you have difficulty managing your finances? no      Do you have difficulty affording your medications? no   Right handed   At [facility]   Social Determinants of Health   Financial Resource Strain: Low Risk  (01/28/2018)   Overall Financial Resource Strain (CARDIA)    Difficulty of Paying Living Expenses: Not hard at all  Food Insecurity: No Food Insecurity (12/06/2021)   Hunger Vital Sign    Worried About Running Out of Food in the Last Year: Never true    Ran Out of Food in the Last  Year: Never true  Transportation Needs: No Transportation Needs (12/06/2021)   PRAPARE - Hydrologist (Medical): No    Lack of Transportation (Non-Medical): No  Physical Activity: Sufficiently Active (01/28/2018)   Exercise Vital Sign    Days of Exercise per Week: 6 days    Minutes of Exercise per Session: 30 min  Stress: No Stress Concern Present (01/28/2018)   [place]    Feeling of Stress : Only a little  Social Connections: Moderately Isolated (01/28/2018)   Social Connection and Isolation Panel [NHANES]    Frequency of Communication with Friends and Family: More than three times a week    Frequency of Social Gatherings with Friends and Family: More than three times a week    Attends Religious Services: Never    Marine scientist or Organizations: No    Attends Archivist Meetings: Never    Marital Status: Widowed  Intimate Partner Violence: Not At Risk (12/06/2021)   Humiliation, Afraid, Rape, and Kick questionnaire    Fear of Current or Ex-Partner: No    Emotionally  Abused: No    Physically Abused: No    Sexually Abused: No     Review of Systems    General:  No chills, fever, night sweats or weight changes.  Cardiovascular:  No chest pain, mild dyspnea on exertion, mild fatigue, no edema, orthopnea, palpitations, paroxysmal nocturnal dyspnea. Dermatological: No rash, lesions/masses.  Respiratory: No cough, dyspnea Urologic: No hematuria, dysuria Abdominal:   No nausea, vomiting, diarrhea, bright red blood per rectum, melena, or hematemesis Neurologic:  No visual changes, wkns, changes in mental status. All other systems reviewed and are otherwise negative except as noted above.     Physical Exam    VS:  BP 122/72 (BP Location: Left Arm, Patient Position: Sitting, Cuff Size: Normal)   Pulse 95   Ht '5\' 2"'$  (1.575 m)   Wt 124 lb 12.8 oz (56.6 kg)   SpO2 98%   BMI 22.83 kg/m  , BMI Body mass index is 22.83 kg/m.     GEN: Well nourished, well developed, in no acute distress. HEENT: Hematoma, hard, with skin darkening, with staples noted in the posterior crown of the scalp.  No active bleeding, staples are intact. Neck: Supple, no JVD, carotid bruits, or masses. Cardiac: IRRR, no murmurs, rubs, or gallops. No clubbing, cyanosis, edema.  Radials/DP/PT 2+ and equal bilaterally.  Respiratory:  Respirations regular and unlabored, clear to auscultation bilaterally. GI: Soft, nontender, nondistended, BS + x 4. MS: no deformity or atrophy. Skin: warm and dry, no rash. Neuro:  Strength and sensation are intact.  Hard of hearing Psych: Normal affect.  Accessory Clinical Findings    ECG personally reviewed by me today-not completed this office visit  Lab Results  Component Value Date   WBC 7.2 12/07/2021   HGB 8.7 (L) 12/07/2021   HCT 25.0 (L) 12/07/2021   MCV 98.4 12/07/2021   PLT 141 (L) 12/07/2021   Lab Results  Component Value Date   CREATININE 0.79 12/07/2021   BUN 15 12/07/2021   NA 137 12/07/2021   K 4.2 12/07/2021   CL 104  12/07/2021   CO2 23 12/07/2021   Lab Results  Component Value Date   ALT 18 10/03/2021   AST 22 10/03/2021   ALKPHOS 75 10/03/2021   BILITOT 0.8 10/03/2021   Lab Results  Component Value Date   CHOL 115 02/02/2021  HDL 48 02/02/2021   LDLCALC 49 02/02/2021   TRIG 91 02/02/2021    Lab Results  Component Value Date   HGBA1C 5.5 01/12/2013    Review of Prior Studies: Echocardiogram 11/27/2018:    1. The left ventricle has normal systolic function with an ejection  fraction of 60-65%. The cavity size was normal. There is mildly increased  left ventricular wall thickness. Left ventricular diastolic function could  not be evaluated secondary to atrial   fibrillation. No evidence of left ventricular regional wall motion  abnormalities.   2. Left atrial size was severely dilated.   3. A 30 mm PhysioRing valve is present in the mitral position.   4. Status post tricuspid valve repair. 28 mm TriAd annuloplasty ring is  present in the tricuspid position.   5. The tricuspid valve is abnormal.   6. The aortic valve is tricuspid. Mild sclerosis of the aortic valve. No  stenosis of the aortic valve.   7. The aorta is normal unless otherwise noted.   8. The inferior vena cava was normal in size with <50% respiratory  variability.   SUMMARY     LVEF 60-65%, mild LVH, normal wall motion, midly reduced RV systolic  function, severe LAE, s/p 30 mm Physio-Ring MV repair with no  stenosis and mild MR, s/p 29 mm TriAd annuloplasty ring, no stenosis  and trivial TR, aortic valve sclerosis, IVC suggests elevated RA  pressure of 8 mmHg.     Assessment & Plan   1.  Persistent atrial fibrillation: Heart rate initially was elevated when she came into the clinic on vital sign, but apical pulse was 76 bpm.  A discussion was had with her daughter, Hosie Spangle, concerning restarting Eliquis.  I have reviewed her most recent labs and seen that her hemoglobin is 8.7.  I am not comfortable restarting  Eliquis until her hemoglobin has returned to normal range or at least above 10.    Her daughter is concerned about a CVA risk.  I will start her on 81 mg enteric-coated aspirin daily.  I have advised that if she begins to have any bleeding at the site of the hematoma on the right posterior crown, the aspirin should be discontinued immediately.  We discussed risk benefits and she is aware that an additional fall or bleed on anticoagulation therapy appears to be more of a risk than a CVA at this time.  She verbalizes understanding.  She will follow-up in our office in 1 months.  Prior to that time serial CBCs will be drawn through [facility]   2.  Hypertension: Blood pressures well controlled currently on medication regimen.  Watchful waiting to evaluate hypotension leading to falls in this frail lady using a rolling walker for ambulation.  She is on Diovan 40 mg daily, and metoprolol 50 mg daily for heart rate control and blood pressure control.  Continue current regimen unless blood pressure is less than 110/50.  3.  Chronic anemia: Most recent hemoglobin is 8.7.  She did receive blood transfusion during recent hospitalization in September 2023.  She continues to be anemic and is being followed by oncology.  Due to this Eliquis will not be restarted.    Current medicines are reviewed at length with the patient today.  I have spent 25 min's  dedicated to the care of this patient on the date of this encounter to include pre-visit review of records, assessment, management and diagnostic testing,with shared decision making. Signed, Phill Myron. Purcell Nails  DNP, ANP, AACC   12/15/2021 2:46 PM      Office [phone] Fax [phone]  Notice: This dictation was prepared with Dragon dictation along with smaller phrase technology. Any transcriptional errors that result from this process are unintentional and may not be corrected upon review.

## 2021-12-14 NOTE — Progress Notes (Signed)
Location:  Occupational psychologist of Service:  ALF (13) Provider:   Cindi Carbon, [place] [phone]   Virgie Dad, MD  Patient Care Team: Virgie Dad, MD as PCP - General (Internal Medicine) Debara Pickett Nadean Corwin, MD as PCP - Cardiology (Cardiology) Pieter Partridge, DO as Consulting Physician (Neurology)  Extended Emergency Contact Information Primary Emergency Contact: [name],[name] Address: [place], [place] [address] Johnnette Litter of [place] Phone: [phone] Mobile Phone: [phone] Relation: Daughter Secondary Emergency Contact: Eula Listen Address: [address]          [place], [place] [address] Johnnette Litter of [place] Mobile Phone: [phone] Relation: Son  Code Status:  DNR Goals of care: Advanced Directive information    12/12/2021    2:53 PM  Advanced Directives  Does Patient Have a Medical Advance Directive? Yes  Type of Paramedic of Deport;Living will;Out of facility DNR (pink MOST or yellow form)  Does patient want to make changes to medical advance directive? No - Patient declined  Copy of [place] in Chart? Yes - validated most recent copy scanned in chart (See row information)     Chief Complaint  Patient presents with   Acute Visit    Scalp check    HPI:  Pt is a 88 y.o. female seen today for an acute visit for scalp check  She fell and hit her head 10/22/21 and was sent to the ED. Had a large right occipital hematoma of the scalp. She was on eliquis for CVA risk reduction associated with afib. Repeat CT in the hospital 8/4 did now show head bleed. The hematoma never re absorbed. On 9/13 she had a large bleed to her head. When she came in her bp was in the 70's and she was given 1 liter of fluid. Hgb was 7 and she was given a unit of blood. Eliquis was ordered to be placed on hold. Bleeding was tied off/stapled and hemostasis was  achieved.  The nurse reports there is still some oozing of blood. Hgb 8.4 9/20.  Nurse reports odor that resolved with cleaning and cutting of hair around the scalp area. She has not had a fever. Staff apply ing calcium alginate dressing.  Nurse reports she had two HR's recorded 135 and 111 in the past 24 hrs. For my visit she was 99 and 74 subsequently. Some of the HRs are taken when she is in exertion. BP was also reported systolic in the 195K but for my visit after resting reduced to 113/71  Past Medical History:  Diagnosis Date   Allergy    seasonal   Anemia    Anxiety    Arthritis    Back   Atrial fibrillation ([place])    Cancer (Normal) 2006   Colon.  Basal Cell Skin cancer- right arm   Cataract    removed bilateral   Colon cancer (HCC)    Constipation due to pain medication therapy    after heart surgery   Dysrhythmia    PAF   GERD (gastroesophageal reflux disease)    Heart murmur    Hyperlipidemia    Hypertension    Hypothyroidism    RA (rheumatoid arthritis) ([place])    Restless leg    Seizures ([place])    after Heart Surgey   Stroke ([place])    TIA- found by neurologist after    Past  Surgical History:  Procedure Laterality Date   ABDOMINAL HYSTERECTOMY  1970   Partial    COLON RESECTION  2006   cancer   COLON SURGERY     COLONOSCOPY     EYE SURGERY Bilateral    Cataract   MAXIMUM ACCESS (MAS)POSTERIOR LUMBAR INTERBODY FUSION (PLIF) 2 LEVEL N/A 04/12/2015   Procedure: Lumbar Three-Five Decompression, Pedicle Screw Fixation, and Posteriolateral Arthrodesis;  Surgeon: Erline Levine, MD;  Location: [facility];  Service: Neurosurgery;  Laterality: N/A;  L3-4 L4-5 Maximum access posterior lumbar fusion, possible interbodies and resection of synovial cyst at L4-5   MITRAL VALVE REPAIR  01/20/2013   Gore-tex cords to P1, P2, and P3. Magic suture to posterior medial commisure, #30 Physio 1 ring. Done in [place]   TONSILLECTOMY     about [place]  01/20/2013    #28 TriAd ring done in [place]    Allergies  Allergen Reactions   Claritin [Loratadine] Other (See Comments)    Listed on MAR Unknown reaction   Codeine Other (See Comments)    "just don't take it well"   Gabapentin Other (See Comments)    Dizziness    Molds & Smuts    Pollen Extract Swelling   Bee Venom Swelling and Rash   Wasp Venom Swelling and Rash    Outpatient Encounter Medications as of 12/14/2021  Medication Sig   amoxicillin (AMOXIL) 500 MG capsule Take 500 mg by mouth daily as needed.   atorvastatin (LIPITOR) 20 MG tablet TAKE 1 TABLET DAILY   Cholecalciferol (VITAMIN D) 50 MCG (2000 UT) CAPS Take 2,000 Units by mouth in the morning.   digoxin (LANOXIN) 0.125 MG tablet TAKE 1 TABLET DAILY   ferrous sulfate 325 (65 FE) MG tablet Take 325 mg by mouth 2 (two) times daily.   hydrocortisone cream 1 % Apply 1 Application topically in the morning. To both palms   leflunomide (ARAVA) 20 MG tablet Take 20 mg by mouth in the morning.   levothyroxine (SYNTHROID) 88 MCG tablet TAKE 1 TABLET DAILY 30 MINUTES BEFORE BREAKFAST ON AN EMPTY STOMACHTAKE 1 TABLET DAILY 30 MINUTES BEFORE BREAKFAST ON AN EMPTY STOMACH   metoprolol succinate (TOPROL-XL) 50 MG 24 hr tablet TAKE 1 TABLET DAILY   mirtazapine (REMERON) 15 MG tablet TAKE 1 TABLET AT BEDTIME   Multiple Vitamin (MULTIVITAMIN) tablet Take 1 tablet by mouth in the morning.   Multiple Vitamins-Minerals (PRESERVISION AREDS 2) CAPS Take 1 capsule by mouth 2 (two) times daily.   omeprazole (PRILOSEC) 40 MG capsule TAKE 1 CAPSULE DAILY   sodium chloride 1 g tablet Take 1 g by mouth in the morning.   valsartan (DIOVAN) 80 MG tablet Take 0.5 tablets (40 mg total) by mouth daily.   Vibegron (GEMTESA) 75 MG TABS Take 32.5 mg by mouth at bedtime.   vitamin B-12 (CYANOCOBALAMIN) 1000 MCG tablet Take 1,000 mcg by mouth in the morning.   No facility-administered encounter medications on file as of 12/14/2021.    Review of Systems   Constitutional:  Negative for activity change, appetite change, chills, diaphoresis, fatigue, fever and unexpected weight change.  HENT:  Negative for congestion.        Scalp hematoma  Respiratory:  Negative for cough, shortness of breath and wheezing.   Cardiovascular:  Negative for chest pain, palpitations and leg swelling.  Gastrointestinal:  Negative for abdominal distention, abdominal pain, constipation and diarrhea.  Genitourinary:  Negative for difficulty urinating and dysuria.  Musculoskeletal:  Positive for gait problem. Negative for arthralgias, back pain, joint swelling and myalgias.  Neurological:  Negative for dizziness, tremors, seizures, syncope, facial asymmetry, speech difficulty, weakness, light-headedness, numbness and headaches.  Psychiatric/Behavioral:  Positive for confusion. Negative for agitation and behavioral problems.     Immunization History  Administered Date(s) Administered   Fluad Quad(high Dose 65+) 01/05/2019, 12/07/2021   H1N1 04/07/2008   Influenza Whole 12/29/2008, 12/22/2009   Influenza, High Dose Seasonal PF 01/15/2012, 12/23/2014, 01/22/2020   Influenza,inj,Quad PF,6+ Mos 01/22/2014, 12/10/2016, 01/17/2018   Influenza,trivalent, recombinat, inj, PF 01/05/2016   Influenza-Unspecified 04/07/2008, 12/15/2014, 01/13/2021   Moderna Sars-Covid-2 Vaccination 04/07/2019, 05/05/2019, 02/09/2020   PFIZER(Purple Top)SARS-COV-2 Vaccination 01/06/2021   PPD Test 07/17/2010, 04/19/2015, 04/25/2015   Pneumococcal Conjugate-13 11/10/2013   Pneumococcal Polysaccharide-23 12/27/2010, 12/05/2016   Tdap 04/15/2017   Zoster Recombinat (Shingrix) 04/09/2017, 06/18/2017   Pertinent  Health Maintenance Due  Topic Date Due   INFLUENZA VACCINE  Completed   DEXA SCAN  Completed      10/27/2021   11:31 AM 12/06/2021    1:11 AM 12/06/2021    5:19 AM 12/06/2021    1:00 PM 12/07/2021    7:51 AM  Fall Risk  Patient Fall Risk Level High fall risk Moderate fall risk High  fall risk High fall risk High fall risk   Functional Status Survey:    Vitals:   12/14/21 1942  BP: 113/71  Pulse: 74  Resp: 19  Temp: (!) 97.1 F (36.2 C)  SpO2: 94%   There is no height or weight on file to calculate BMI. Physical Exam Vitals and nursing note reviewed.  Constitutional:      General: She is not in acute distress.    Appearance: She is not diaphoretic.  HENT:     Head:     Comments: Posterior scalp hematoma drying with no oozing. No redness or purulent drainage. No warmth. Two staples in place Eyes:     Extraocular Movements: Extraocular movements intact.     Pupils: Pupils are equal, round, and reactive to light.  Neck:     Vascular: No JVD.  Cardiovascular:     Rate and Rhythm: Normal rate. Rhythm irregular.     Heart sounds: No murmur heard. Pulmonary:     Effort: Pulmonary effort is normal. No respiratory distress.     Breath sounds: Normal breath sounds. No wheezing.  Skin:    General: Skin is warm and dry.  Neurological:     Mental Status: She is alert and oriented to person, place, and time.     Labs reviewed: Recent Labs    10/27/21 1320 10/30/21 0900 11/23/21 0000 12/06/21 0211 12/07/21 0548  NA 126*   < > 135* 132* 137  K 4.5   < > 4.4 3.5 4.2  CL 95*   < > 100 100 104  CO2 21*   < > 24* 22 23  GLUCOSE 103*  --   --  157* 90  BUN 15   < > '17 21 15  '$ CREATININE 0.71   < > 0.7 0.90 0.79  CALCIUM 8.9   < > 8.9 8.5* 9.1  MG  --   --   --   --  1.6*   < > = values in this interval not displayed.   Recent Labs    07/25/21 0945 08/22/21 1134 10/03/21 1055  AST '20 22 22  '$ ALT '18 20 18  '$ ALKPHOS 86 84 75  BILITOT 0.6 0.6 0.8  PROT  6.8 7.5 7.1  ALBUMIN 4.3 4.5 4.5   Recent Labs    10/27/21 1320 10/30/21 0900 12/06/21 0211 12/06/21 0313 12/06/21 0520 12/06/21 1109 12/07/21 0548  WBC 10.0   < > 9.9  --   --  9.3 7.2  NEUTROABS 7.4  --  4.1  --   --   --  4.0  HGB 11.6*   < > 8.5*   < > 8.2* 8.8* 8.7*  HCT 34.6*   < >  25.3*   < > 25.1* 26.2* 25.0*  MCV 99.1  --  101.2*  --   --  101.6* 98.4  PLT 186   < > 186  --   --  136* 141*   < > = values in this interval not displayed.   Lab Results  Component Value Date   TSH 17.80 (A) 11/23/2021   Lab Results  Component Value Date   HGBA1C 5.5 01/12/2013   Lab Results  Component Value Date   CHOL 115 02/02/2021   HDL 48 02/02/2021   LDLCALC 49 02/02/2021   TRIG 91 02/02/2021    Significant Diagnostic Results in last 30 days:  CT Head Wo Contrast  Result Date: 12/06/2021 CLINICAL DATA:  Bleeding wound EXAM: CT HEAD WITHOUT CONTRAST TECHNIQUE: Contiguous axial images were obtained from the base of the skull through the vertex without intravenous contrast. RADIATION DOSE REDUCTION: This exam was performed according to the departmental dose-optimization program which includes automated exposure control, adjustment of the mA and/or kV according to patient size and/or use of iterative reconstruction technique. COMPARISON:  10/27/2021 FINDINGS: Brain: No evidence of acute infarction, hemorrhage, hydrocephalus, extra-axial collection or mass lesion/mass effect. Subcortical white matter and periventricular small vessel ischemic changes. Vascular: Intracranial atherosclerosis. Skull: Normal. Negative for fracture or focal lesion. Sinuses/Orbits: The visualized paranasal sinuses are essentially clear. The mastoid air cells are unopacified. Other: Small extracranial hematoma overlying the right parietal bone (series 3/image 32). IMPRESSION: Small extracranial hematoma overlying the right parietal bone. No evidence of calvarial fracture. No acute intracranial abnormality. Small vessel ischemic changes. Electronically Signed   By: Julian Hy M.D.   On: 12/06/2021 02:01    Assessment/Plan 1. Hematoma of scalp, subsequent encounter I did not see any sign of infection but this will need to be monitored closely. Staples to be left in 10 days per hospital note placed on  9/13.   Area is drying, off eliquis. Send to the ER for further bleeding.   2. Chronic atrial fibrillation (HCC) Rate has been reported as elevated but this may be based entirely on exertion. Would recommend recording vitals at rest.  She is going to f/u with cardiology about the use of eliquis risk vs benefit.   3. Essential hypertension Controlled, record vitals at rest.   4. Iron deficiency anemia due to chronic blood loss Hgb stable from last check at 8.4 but low Continue Iron CBC due 10/13    Family/ staff Communication: nurse  Labs/tests ordered:  CBC 10/13

## 2021-12-15 ENCOUNTER — Ambulatory Visit: Payer: Medicare Other | Attending: Adult Health | Admitting: Adult Health

## 2021-12-15 ENCOUNTER — Encounter: Payer: Self-pay | Admitting: Adult Health

## 2021-12-15 VITALS — BP 122/72 | HR 95 | Ht 62.0 in | Wt 124.8 lb

## 2021-12-15 DIAGNOSIS — M6389 Disorders of muscle in diseases classified elsewhere, multiple sites: Secondary | ICD-10-CM | POA: Diagnosis not present

## 2021-12-15 DIAGNOSIS — G3184 Mild cognitive impairment, so stated: Secondary | ICD-10-CM | POA: Diagnosis not present

## 2021-12-15 DIAGNOSIS — I4811 Longstanding persistent atrial fibrillation: Secondary | ICD-10-CM | POA: Insufficient documentation

## 2021-12-15 DIAGNOSIS — R278 Other lack of coordination: Secondary | ICD-10-CM | POA: Diagnosis not present

## 2021-12-15 DIAGNOSIS — I10 Essential (primary) hypertension: Secondary | ICD-10-CM | POA: Insufficient documentation

## 2021-12-15 DIAGNOSIS — N3000 Acute cystitis without hematuria: Secondary | ICD-10-CM | POA: Diagnosis not present

## 2021-12-15 DIAGNOSIS — H05239 Hemorrhage of unspecified orbit: Secondary | ICD-10-CM | POA: Insufficient documentation

## 2021-12-15 DIAGNOSIS — R296 Repeated falls: Secondary | ICD-10-CM | POA: Diagnosis not present

## 2021-12-15 MED ORDER — ASPIRIN 81 MG PO TBEC: 81.0000 mg | DELAYED_RELEASE_TABLET | Freq: Every day | ORAL | 3 refills | Status: DC

## 2021-12-15 NOTE — Patient Instructions (Signed)
Medication Instructions:  Start Aspirin 81 mg ( Take 1 Tablet Daily). *If you need a refill on your cardiac medications before your next appointment, please call your pharmacy*   Lab Work: No Labs If you have labs (blood work) drawn today and your tests are completely normal, you will receive your results only by: [place] (if you have MyChart) OR A paper copy in the mail If you have any lab test that is abnormal or we need to change your treatment, we will call you to review the results.   Testing/Procedures: No Testing   Follow-Up: At [facility], you and your health needs are our priority.  As part of our continuing mission to provide you with exceptional heart care, we have created designated Provider Care Teams.  These Care Teams include your primary Cardiologist (physician) and Advanced Practice Providers (APPs -  Physician Assistants and Nurse Practitioners) who all work together to provide you with the care you need, when you need it.  We recommend signing up for the patient portal called "MyChart".  Sign up information is provided on this After Visit Summary.  MyChart is used to connect with patients for Virtual Visits (Telemedicine).  Patients are able to view lab/test results, encounter notes, upcoming appointments, etc.  Non-urgent messages can be sent to your provider as well.   To learn more about what you can do with MyChart, go to [email or web address].    Your next appointment:   1 month(s)  The format for your next appointment:   In Person  Provider:   Jory Sims, DNP, ANP   or, Pixie Casino, MD

## 2021-12-18 ENCOUNTER — Encounter: Payer: Self-pay | Admitting: Internal Medicine

## 2021-12-20 ENCOUNTER — Observation Stay (HOSPITAL_COMMUNITY)
Admission: EM | Admit: 2021-12-20 | Discharge: 2021-12-22 | Disposition: A | Discharge status: Home Health | Payer: Medicare Other | Attending: Family Medicine | Admitting: Family Medicine

## 2021-12-20 ENCOUNTER — Encounter: Payer: Self-pay | Admitting: Orthopedic Surgery

## 2021-12-20 ENCOUNTER — Non-Acute Institutional Stay: Payer: Medicare Other | Admitting: Orthopedic Surgery

## 2021-12-20 DIAGNOSIS — S0101XA Laceration without foreign body of scalp, initial encounter: Secondary | ICD-10-CM

## 2021-12-20 DIAGNOSIS — Z85828 Personal history of other malignant neoplasm of skin: Secondary | ICD-10-CM | POA: Diagnosis not present

## 2021-12-20 DIAGNOSIS — I4891 Unspecified atrial fibrillation: Secondary | ICD-10-CM | POA: Diagnosis not present

## 2021-12-20 DIAGNOSIS — E039 Hypothyroidism, unspecified: Secondary | ICD-10-CM | POA: Diagnosis not present

## 2021-12-20 DIAGNOSIS — K5900 Constipation, unspecified: Secondary | ICD-10-CM

## 2021-12-20 DIAGNOSIS — Z8673 Personal history of transient ischemic attack (TIA), and cerebral infarction without residual deficits: Secondary | ICD-10-CM | POA: Insufficient documentation

## 2021-12-20 DIAGNOSIS — L03811 Cellulitis of head [any part, except face]: Secondary | ICD-10-CM

## 2021-12-20 DIAGNOSIS — Z7982 Long term (current) use of aspirin: Secondary | ICD-10-CM | POA: Diagnosis not present

## 2021-12-20 DIAGNOSIS — S0003XD Contusion of scalp, subsequent encounter: Secondary | ICD-10-CM | POA: Diagnosis not present

## 2021-12-20 DIAGNOSIS — R58 Hemorrhage, not elsewhere classified: Secondary | ICD-10-CM | POA: Diagnosis not present

## 2021-12-20 DIAGNOSIS — I10 Essential (primary) hypertension: Secondary | ICD-10-CM | POA: Diagnosis present

## 2021-12-20 DIAGNOSIS — D62 Acute posthemorrhagic anemia: Secondary | ICD-10-CM | POA: Diagnosis present

## 2021-12-20 DIAGNOSIS — Z85038 Personal history of other malignant neoplasm of large intestine: Secondary | ICD-10-CM | POA: Insufficient documentation

## 2021-12-20 DIAGNOSIS — I480 Paroxysmal atrial fibrillation: Secondary | ICD-10-CM | POA: Diagnosis not present

## 2021-12-20 DIAGNOSIS — M7981 Nontraumatic hematoma of soft tissue: Secondary | ICD-10-CM | POA: Diagnosis not present

## 2021-12-20 DIAGNOSIS — R000 Tachycardia, unspecified: Secondary | ICD-10-CM | POA: Diagnosis not present

## 2021-12-20 DIAGNOSIS — Z79899 Other long term (current) drug therapy: Secondary | ICD-10-CM | POA: Diagnosis not present

## 2021-12-20 DIAGNOSIS — S0003XA Contusion of scalp, initial encounter: Secondary | ICD-10-CM | POA: Diagnosis present

## 2021-12-20 MED ORDER — LIDOCAINE-EPINEPHRINE (PF) 2 %-1:200000 IJ SOLN
20.0000 mL | Freq: Once | INTRAMUSCULAR | Status: AC
  Administered 2021-12-20: 20 mL
  Filled 2021-12-20: qty 20

## 2021-12-20 MED ORDER — DOXYCYCLINE HYCLATE 100 MG PO TABS: 100.0000 mg | ORAL_TABLET | Freq: Two times a day (BID) | ORAL | 0 refills | Status: DC

## 2021-12-20 NOTE — Progress Notes (Signed)
Location:  [facility] Room Number: 308/M Place of Service:  ALF [phone]) Provider:  Yvonna Alanis, NP   Virgie Dad, MD  Patient Care Team: Virgie Dad, MD as PCP - General (Internal Medicine) Pixie Casino, MD as PCP - Cardiology (Cardiology) Pieter Partridge, DO as Consulting Physician (Neurology)  Extended Emergency Contact Information Primary Emergency Contact: [name],[name] Address: [address]          [place], [place] [address] Johnnette Litter of [place] Phone: [phone] Mobile Phone: [phone] Relation: Daughter Secondary Emergency Contact: Eula Listen Address: [address]          [place],  [address] Johnnette Litter of [place] Mobile Phone: [phone] Relation: Son  Code Status:  DNR Goals of care: Advanced Directive information    12/12/2021    2:53 PM  Advanced Directives  Does Patient Have a Medical Advance Directive? Yes  Type of Paramedic of [place];Living will;Out of facility DNR (pink MOST or yellow form)  Does patient want to make changes to medical advance directive? No - Patient declined  Copy of [place] in Chart? Yes - validated most recent copy scanned in chart (See row information)     Chief Complaint  Patient presents with   Acute Visit    Scalp hematoma    HPI:  Pt is a 88 y.o. female seen today for acute visit due to scalp hematoma.   07/30 she had a mechanical fall and injured her head. She was evaluated by the ED and noted to have a large right occipital hematoma to scalp. 08/04 CT head noted increased hemorrhage to right parietal scalp hematoma, prominent focal scalp hematoma present, no skull fracture or intracranial hemorrhage. 09/13 CT head noted large bleed to head. Bp was in 70's requiring in 1L fluids and 1 unit PRBC being given. Eliquis was placed on hold, bleeding was tied/stapled off.   Today, hgb 9.4 (09/20). She was seen by cardiology  and Eliquis discontinued. She remains on aspirin 81 mg daily. Nursing removed staple a few days ago and hematoma has been slowly oozing blood since. In addition, hematoma now has a profound odor. Patient reports odor has worsened in past few days- confirmed by wound nurse. She is scheduled with wound clinic for possible cauterization 10/09. She reports intermittent mild pain to right side of head. Afebrile. Vitals stable.   Past Medical History:  Diagnosis Date   Allergy    seasonal   Anemia    Anxiety    Arthritis    Back   Atrial fibrillation ([place])    Cancer ([place]) 2006   Colon.  Basal Cell Skin cancer- right arm   Cataract    removed bilateral   Colon cancer ([place])    Constipation due to pain medication therapy    after heart surgery   Dysrhythmia    PAF   GERD (gastroesophageal reflux disease)    Heart murmur    Hyperlipidemia    Hypertension    Hypothyroidism    RA (rheumatoid arthritis) ([facility])    Restless leg    Seizures ([place])    after Heart Surgey   Stroke ([place])    TIA- found by neurologist after    Past Surgical History:  Procedure Laterality Date   ABDOMINAL HYSTERECTOMY  1970   Partial    COLON RESECTION  2006   cancer   COLON SURGERY     COLONOSCOPY     EYE  SURGERY Bilateral    Cataract   MAXIMUM ACCESS (MAS)POSTERIOR LUMBAR INTERBODY FUSION (PLIF) 2 LEVEL N/A 04/12/2015   Procedure: Lumbar Three-Five Decompression, Pedicle Screw Fixation, and Posteriolateral Arthrodesis;  Surgeon: Erline Levine, MD;  Location: [facility];  Service: Neurosurgery;  Laterality: N/A;  L3-4 L4-5 Maximum access posterior lumbar fusion, possible interbodies and resection of synovial cyst at L4-5   MITRAL VALVE REPAIR  01/20/2013   Gore-tex cords to P1, P2, and P3. Magic suture to posterior medial commisure, #30 Physio 1 ring. Done in [place]   TONSILLECTOMY     about [place]  01/20/2013   #28 TriAd ring done in [place]    Allergies  Allergen Reactions    Claritin [Loratadine] Other (See Comments)    Listed on MAR Unknown reaction   Codeine Other (See Comments)    "just don't take it well"   Gabapentin Other (See Comments)    Dizziness    Molds & Smuts    Pollen Extract Swelling   Bee Venom Swelling and Rash   Wasp Venom Swelling and Rash    Outpatient Encounter Medications as of 12/20/2021  Medication Sig   amoxicillin (AMOXIL) 500 MG capsule Take 500 mg by mouth daily as needed.   aspirin EC 81 MG tablet Take 1 tablet (81 mg total) by mouth daily. Swallow whole.   atorvastatin (LIPITOR) 20 MG tablet TAKE 1 TABLET DAILY   Cholecalciferol (VITAMIN D) 50 MCG (2000 UT) CAPS Take 2,000 Units by mouth in the morning.   digoxin (LANOXIN) 0.125 MG tablet TAKE 1 TABLET DAILY   ferrous sulfate 325 (65 FE) MG tablet Take 325 mg by mouth 2 (two) times daily.   hydrocortisone cream 1 % Apply 1 Application topically in the morning. To both palms   leflunomide (ARAVA) 20 MG tablet Take 20 mg by mouth in the morning.   levothyroxine (SYNTHROID) 88 MCG tablet TAKE 1 TABLET DAILY 30 MINUTES BEFORE BREAKFAST ON AN EMPTY STOMACHTAKE 1 TABLET DAILY 30 MINUTES BEFORE BREAKFAST ON AN EMPTY STOMACH   metoprolol succinate (TOPROL-XL) 50 MG 24 hr tablet TAKE 1 TABLET DAILY   mirtazapine (REMERON) 15 MG tablet TAKE 1 TABLET AT BEDTIME   Multiple Vitamin (MULTIVITAMIN) tablet Take 1 tablet by mouth in the morning.   Multiple Vitamins-Minerals (PRESERVISION AREDS 2) CAPS Take 1 capsule by mouth 2 (two) times daily.   omeprazole (PRILOSEC) 40 MG capsule TAKE 1 CAPSULE DAILY   sodium chloride 1 g tablet Take 1 g by mouth in the morning.   valsartan (DIOVAN) 80 MG tablet Take 0.5 tablets (40 mg total) by mouth daily.   Vibegron (GEMTESA) 75 MG TABS Take 32.5 mg by mouth at bedtime.   vitamin B-12 (CYANOCOBALAMIN) 1000 MCG tablet Take 1,000 mcg by mouth in the morning.   No facility-administered encounter medications on file as of 12/20/2021.    Review of Systems   Constitutional:  Negative for chills, fatigue and fever.  Respiratory:  Negative for cough, shortness of breath and wheezing.   Cardiovascular:  Negative for chest pain and leg swelling.  Gastrointestinal:  Negative for nausea and vomiting.  Skin:  Positive for wound.  Neurological:  Positive for weakness. Negative for dizziness and headaches.  Psychiatric/Behavioral:  Negative for confusion and dysphoric mood. The patient is not nervous/anxious.     Immunization History  Administered Date(s) Administered   Fluad Quad(high Dose 65+) 01/05/2019, 12/07/2021   H1N1 04/07/2008   Influenza Whole 12/29/2008, 12/22/2009  Influenza, High Dose Seasonal PF 01/15/2012, 12/23/2014, 01/22/2020   Influenza,inj,Quad PF,6+ Mos 01/22/2014, 12/10/2016, 01/17/2018   Influenza,trivalent, recombinat, inj, PF 01/05/2016   Influenza-Unspecified 04/07/2008, 12/15/2014, 01/13/2021   Moderna Sars-Covid-2 Vaccination 04/07/2019, 05/05/2019, 02/09/2020   PFIZER(Purple Top)SARS-COV-2 Vaccination 01/06/2021   PPD Test 07/17/2010, 04/19/2015, 04/25/2015   Pneumococcal Conjugate-13 11/10/2013   Pneumococcal Polysaccharide-23 12/27/2010, 12/05/2016   Tdap 04/15/2017   Zoster Recombinat (Shingrix) 04/09/2017, 06/18/2017   Pertinent  Health Maintenance Due  Topic Date Due   INFLUENZA VACCINE  Completed   DEXA SCAN  Completed      10/27/2021   11:31 AM 12/06/2021    1:11 AM 12/06/2021    5:19 AM 12/06/2021    1:00 PM 12/07/2021    7:51 AM  Fall Risk  Patient Fall Risk Level High fall risk Moderate fall risk High fall risk High fall risk High fall risk   Functional Status Survey:    Vitals:   12/20/21 1452  BP: (!) 167/79  Pulse: 94  Resp: 18  Temp: 97.7 F (36.5 C)  SpO2: 96%  Weight: 119 lb (54 kg)  Height: '5\' 2"'$  (1.575 m)   Body mass index is 21.77 kg/m. Physical Exam Vitals reviewed.  Constitutional:      General: She is not in acute distress. HENT:     Head: Normocephalic.     Comments:  Approx 2-3 cm hematoma to right parietal, skin scabbed over, small amount of blood/serous fluid present, profound odor, some tenderness, hair/surrounding skin CDI Eyes:     General:        Right eye: No discharge.        Left eye: No discharge.  Cardiovascular:     Rate and Rhythm: Normal rate and regular rhythm.     Pulses: Normal pulses.     Heart sounds: Normal heart sounds.  Pulmonary:     Effort: Pulmonary effort is normal. No respiratory distress.     Breath sounds: Normal breath sounds. No wheezing.  Abdominal:     General: Bowel sounds are normal. There is no distension.     Palpations: Abdomen is soft.     Tenderness: There is no abdominal tenderness.  Musculoskeletal:     Cervical back: Neck supple.     Right lower leg: No edema.     Left lower leg: No edema.  Neurological:     Mental Status: She is alert.     Labs reviewed: Recent Labs    10/27/21 1320 10/30/21 0900 11/23/21 0000 12/06/21 0211 12/07/21 0548  NA 126*   < > 135* 132* 137  K 4.5   < > 4.4 3.5 4.2  CL 95*   < > 100 100 104  CO2 21*   < > 24* 22 23  GLUCOSE 103*  --   --  157* 90  BUN 15   < > '17 21 15  '$ CREATININE 0.71   < > 0.7 0.90 0.79  CALCIUM 8.9   < > 8.9 8.5* 9.1  MG  --   --   --   --  1.6*   < > = values in this interval not displayed.   Recent Labs    07/25/21 0945 08/22/21 1134 10/03/21 1055  AST '20 22 22  '$ ALT '18 20 18  '$ ALKPHOS 86 84 75  BILITOT 0.6 0.6 0.8  PROT 6.8 7.5 7.1  ALBUMIN 4.3 4.5 4.5   Recent Labs    10/27/21 1320 10/30/21 0900 12/06/21 0211 12/06/21 0313 12/06/21  1109 12/07/21 0548 12/13/21 0000  WBC 10.0   < > 9.9  --  9.3 7.2 6.8  NEUTROABS 7.4  --  4.1  --   --  4.0  --   HGB 11.6*   < > 8.5*   < > 8.8* 8.7* 8.4*  HCT 34.6*   < > 25.3*   < > 26.2* 25.0* 25*  MCV 99.1  --  101.2*  --  101.6* 98.4  --   PLT 186   < > 186  --  136* 141* 229   < > = values in this interval not displayed.   Lab Results  Component Value Date   TSH 17.80 (A)  11/23/2021   Lab Results  Component Value Date   HGBA1C 5.5 01/12/2013   Lab Results  Component Value Date   CHOL 115 02/02/2021   HDL 48 02/02/2021   LDLCALC 49 02/02/2021   TRIG 91 02/02/2021    Significant Diagnostic Results in last 30 days:  CT Head Wo Contrast  Result Date: 12/06/2021 CLINICAL DATA:  Bleeding wound EXAM: CT HEAD WITHOUT CONTRAST TECHNIQUE: Contiguous axial images were obtained from the base of the skull through the vertex without intravenous contrast. RADIATION DOSE REDUCTION: This exam was performed according to the departmental dose-optimization program which includes automated exposure control, adjustment of the mA and/or kV according to patient size and/or use of iterative reconstruction technique. COMPARISON:  10/27/2021 FINDINGS: Brain: No evidence of acute infarction, hemorrhage, hydrocephalus, extra-axial collection or mass lesion/mass effect. Subcortical white matter and periventricular small vessel ischemic changes. Vascular: Intracranial atherosclerosis. Skull: Normal. Negative for fracture or focal lesion. Sinuses/Orbits: The visualized paranasal sinuses are essentially clear. The mastoid air cells are unopacified. Other: Small extracranial hematoma overlying the right parietal bone (series 3/image 32). IMPRESSION: Small extracranial hematoma overlying the right parietal bone. No evidence of calvarial fracture. No acute intracranial abnormality. Small vessel ischemic changes. Electronically Signed   By: Julian Hy M.D.   On: 12/06/2021 02:01    Assessment/Plan 1. Hematoma of scalp, subsequent encounter - mechanical fall 07/30 - 08/04 CT head noted increased hemorrhage to right parietal scalp hematoma, prominent focal scalp hematoma present, no skull fracture or intracranial hemorrhage  - 09/13 CT head noted large bleed to head - staples removed, continues to ooze - scheduled to see wound clinic 10/09- possible cauterization?  2. Cellulitis of  scalp - worsening odor, tenderness, slow healing - start doxycycline  - see above  Family/ staff Communication: plan discussed with patient and nurse  Labs/tests ordered:  wound clinic 10/09

## 2021-12-20 NOTE — ED Triage Notes (Signed)
Pt to ED via EMS coming from [facility] assisted [facility]. Pt had fall in July, hit right side of head, has a hematoma to right side of head. Pt was on eliquis at the time but is no longer taking eliquis. Pt recently seen at ED on 9/13 when her hematoma began bleeding. Pt found to have arterial bleed and had the vessel stitched and head stapled. Tonight, pt was having BM on toilet when she strained and hematoma began bleeding again. Nursing facility applied towels to head to control bleeding and saturated 3 hand sized towels with blood. Upon EMS arrival, EMS applied pressure with gauze and bleeding controlled after 7 minutes. Pt's head wrapped with dressing upon arrival to ED. Pt denies dizziness or pain. Pt has no other complaints. Bleeding controlled upon arrival to ED. Pt AAOx4.  EMS Vitals: 200/102 then to 148/86 90-110 HR with PVCs

## 2021-12-20 NOTE — ED Provider Notes (Signed)
[facility] EMERGENCY DEPARTMENT Provider Note   CSN: [id number] Arrival date & time: 12/20/21  2215     History {Add pertinent medical, surgical, social history, OB history to HPI:1} Chief Complaint  Patient presents with   Bleeding    Kathy Velez is a 88 y.o. female.  The history is provided by the patient.  Patient presents for scalp bleeding.  Patient has had multiple issues with bleeding from her scalp after previous trauma.  Patient was admitted earlier this month for similar episode. She had been on Eliquis, but this has been discontinued and is currently on aspirin. Tonight patient was on the toilet straining for a bowel movement when she started bleeding from the right side of her head again.  She denies any new head trauma.  No weakness or dizziness.  Bleeding is now controlled     Home Medications Prior to Admission medications   Medication Sig Start Date End Date Taking? Authorizing Provider  amoxicillin (AMOXIL) 500 MG capsule Take 500 mg by mouth daily as needed.    [provider]  aspirin EC 81 MG tablet Take 1 tablet (81 mg total) by mouth daily. Swallow whole. 12/15/21   Lendon Colonel, NP  atorvastatin (LIPITOR) 20 MG tablet TAKE 1 TABLET DAILY 06/14/21   Virgie Dad, MD  Cholecalciferol (VITAMIN D) 50 MCG (2000 UT) CAPS Take 2,000 Units by mouth in the morning.    [provider]  digoxin (LANOXIN) 0.125 MG tablet TAKE 1 TABLET DAILY 06/14/21   Virgie Dad, MD  doxycycline (VIBRA-TABS) 100 MG tablet Take 1 tablet (100 mg total) by mouth 2 (two) times daily for 7 days. 12/20/21 12/27/21  Fargo, Amy E, NP  ferrous sulfate 325 (65 FE) MG tablet Take 325 mg by mouth 2 (two) times daily.    [provider]  hydrocortisone cream 1 % Apply 1 Application topically in the morning. To both palms    [provider]  leflunomide (ARAVA) 20 MG tablet Take 20 mg by mouth in the morning. 10/05/19   [provider]  levothyroxine (SYNTHROID) 88 MCG tablet TAKE 1 TABLET DAILY 30 MINUTES BEFORE BREAKFAST ON AN EMPTY STOMACHTAKE 1 TABLET DAILY 30 MINUTES BEFORE BREAKFAST ON AN EMPTY STOMACH 11/24/21   Royal Hawthorn, NP  metoprolol succinate (TOPROL-XL) 50 MG 24 hr tablet TAKE 1 TABLET DAILY 06/14/21   Virgie Dad, MD  mirtazapine (REMERON) 15 MG tablet TAKE 1 TABLET AT BEDTIME 02/20/21   Virgie Dad, MD  Multiple Vitamin (MULTIVITAMIN) tablet Take 1 tablet by mouth in the morning.    [provider]  Multiple Vitamins-Minerals (PRESERVISION AREDS 2) CAPS Take 1 capsule by mouth 2 (two) times daily.    [provider]  omeprazole (PRILOSEC) 40 MG capsule TAKE 1 CAPSULE DAILY 10/17/21   Virgie Dad, MD  sodium chloride 1 g tablet Take 1 g by mouth in the morning.    [provider]  valsartan (DIOVAN) 80 MG tablet Take 0.5 tablets (40 mg total) by mouth daily. 10/26/21   Royal Hawthorn, NP  Vibegron (GEMTESA) 75 MG TABS Take 32.5 mg by mouth at bedtime. 11/10/21   Royal Hawthorn, NP  vitamin B-12 (CYANOCOBALAMIN) 1000 MCG tablet Take 1,000 mcg by mouth in the morning.    [provider]      Allergies    Claritin [loratadine], Codeine, Gabapentin, Molds & smuts, Pollen extract, Bee venom, and Wasp venom    Review  of Systems   Review of Systems  Constitutional:  Negative for fever.  Skin:  Positive for wound.  Neurological:  Negative for dizziness.    Physical Exam Updated Vital Signs BP 133/86   Pulse 87   Temp 98 F (36.7 C) (Oral)   Resp 18   SpO2 96%  Physical Exam CONSTITUTIONAL: Elderly, no acute distress HEAD: Bandage noted to head, hair is soaked with blood but there is no active bleeding.  No visible trauma EYES: EOMI ENMT: Mucous membranes moist NECK: supple no meningeal signs NEURO: Pt is awake/alert/appropriate, moves all extremitiesx4.  No facial droop.   EXTREMITIES: pulses normal/equal, full ROM SKIN: warm, color  normal PSYCH: no abnormalities of mood noted, alert and oriented to situation       ED Results / Procedures / Treatments   Labs (all labs ordered are listed, but only abnormal results are displayed) Labs Reviewed  CBC  PROTIME-INR  BASIC METABOLIC PANEL    EKG EKG Interpretation  Date/Time:  Wednesday December 20 2021 22:28:43 EDT Ventricular Rate:  100 PR Interval:    QRS Duration: 89 QT Interval:  349 QTC Calculation: 451 R Axis:   -2 Text Interpretation: Atrial fibrillation Ventricular premature complex LVH with secondary repolarization abnormality Anterior Q waves, possibly due to LVH Confirmed by Ripley Fraise [phone]) on 12/20/2021 11:04:06 PM  Radiology No results found.  Procedures .Critical Care  Performed by: Ripley Fraise, MD Authorized by: Ripley Fraise, MD   Critical care provider statement:    Critical care start time:  12/20/2021 11:58 PM   Critical care end time:  12/20/2021 11:58 PM   Critical care time was exclusive of:  Separately billable procedures and treating other patients   Critical care was necessary to treat or prevent imminent or life-threatening deterioration of the following conditions:  Shock   Critical care was time spent personally by me on the following activities:  Development of treatment plan with patient or surrogate, discussions with consultants, examination of patient, obtaining history from patient or surrogate, re-evaluation of patient's condition, ordering and review of laboratory studies and ordering and performing treatments and interventions   I assumed direction of critical care for this patient from another provider in my specialty: no     Care discussed with: admitting provider   .Marland KitchenLaceration Repair  Date/Time: 12/20/2021 11:58 PM  Performed by: Ripley Fraise, MD Authorized by: Ripley Fraise, MD   Consent:    Consent obtained:  Emergent situation Universal protocol:    Patient identity confirmed:   Provided demographic data Anesthesia:    Anesthesia method:  Local infiltration   Local anesthetic:  Lidocaine 2% WITH epi Laceration details:    Location:  Scalp   Length (cm):  3 Exploration:    Hemostasis achieved with:  Direct pressure Skin repair:    Repair method:  Staples   Number of staples:  5 Approximation:    Approximation:  Close Repair type:    Repair type:  Complex Post-procedure details:    Procedure completion:  Tolerated Comments:     After area was cleaned, active bleeding noted at the lesion of the right scalp.  I held pressure for several minutes.  This was an obvious arterial bleed.  Approximately 5 staples were placed emergently.  Also attempted to place sutures but this did not correct the problem.  After staples were placed, wound was bandaged.   {Document cardiac monitor, telemetry assessment procedure when appropriate:1}  Medications Ordered in ED Medications  lidocaine-EPINEPHrine (XYLOCAINE  W/EPI) 2 %-1:200000 (PF) injection 20 mL (has no administration in time range)    ED Course/ Medical Decision Making/ A&P                           Medical Decision Making Amount and/or Complexity of Data Reviewed Labs: ordered.  Risk Prescription drug management.   ***  {Document critical care time when appropriate:1} {Document review of labs and clinical decision tools ie heart score, Chads2Vasc2 etc:1}  {Document your independent review of radiology images, and any outside records:1} {Document your discussion with family members, caretakers, and with consultants:1} {Document social determinants of health affecting pt's care:1} {Document your decision making why or why not admission, treatments were needed:1} Final Clinical Impression(s) / ED Diagnoses Final diagnoses:  None    Rx / DC Orders ED Discharge Orders     None

## 2021-12-21 ENCOUNTER — Encounter (HOSPITAL_COMMUNITY): Payer: Self-pay | Admitting: Internal Medicine

## 2021-12-21 ENCOUNTER — Other Ambulatory Visit: Payer: Self-pay

## 2021-12-21 DIAGNOSIS — M7981 Nontraumatic hematoma of soft tissue: Secondary | ICD-10-CM | POA: Diagnosis not present

## 2021-12-21 DIAGNOSIS — L089 Local infection of the skin and subcutaneous tissue, unspecified: Secondary | ICD-10-CM | POA: Diagnosis not present

## 2021-12-21 DIAGNOSIS — L988 Other specified disorders of the skin and subcutaneous tissue: Secondary | ICD-10-CM | POA: Diagnosis not present

## 2021-12-21 DIAGNOSIS — L98499 Non-pressure chronic ulcer of skin of other sites with unspecified severity: Secondary | ICD-10-CM | POA: Diagnosis not present

## 2021-12-21 DIAGNOSIS — S0003XD Contusion of scalp, subsequent encounter: Secondary | ICD-10-CM

## 2021-12-21 DIAGNOSIS — L929 Granulomatous disorder of the skin and subcutaneous tissue, unspecified: Secondary | ICD-10-CM | POA: Diagnosis not present

## 2021-12-21 LAB — CBC
HCT: 29.5 % — ABNORMAL LOW (ref 36.0–46.0)
HCT: 23.9 % — ABNORMAL LOW (ref 36.0–46.0)
HCT: 23.2 % — ABNORMAL LOW (ref 36.0–46.0)
Hemoglobin: 9.7 g/dL — ABNORMAL LOW (ref 12.0–15.0)
Hemoglobin: 8 g/dL — ABNORMAL LOW (ref 12.0–15.0)
Hemoglobin: 7.4 g/dL — ABNORMAL LOW (ref 12.0–15.0)
MCH: 35 pg — ABNORMAL HIGH (ref 26.0–34.0)
MCH: 35.7 pg — ABNORMAL HIGH (ref 26.0–34.0)
MCH: 34.9 pg — ABNORMAL HIGH (ref 26.0–34.0)
MCHC: 32.9 g/dL (ref 30.0–36.0)
MCHC: 33.5 g/dL (ref 30.0–36.0)
MCHC: 31.9 g/dL (ref 30.0–36.0)
MCV: 106.5 fL — ABNORMAL HIGH (ref 80.0–100.0)
MCV: 106.7 fL — ABNORMAL HIGH (ref 80.0–100.0)
MCV: 109.4 fL — ABNORMAL HIGH (ref 80.0–100.0)
Platelets: 231 10*3/uL (ref 150–400)
Platelets: 237 10*3/uL (ref 150–400)
Platelets: 216 10*3/uL (ref 150–400)
RBC: 2.77 MIL/uL — ABNORMAL LOW (ref 3.87–5.11)
RBC: 2.24 MIL/uL — ABNORMAL LOW (ref 3.87–5.11)
RBC: 2.12 MIL/uL — ABNORMAL LOW (ref 3.87–5.11)
RDW: 17.7 % — ABNORMAL HIGH (ref 11.5–15.5)
RDW: 17.7 % — ABNORMAL HIGH (ref 11.5–15.5)
RDW: 17.9 % — ABNORMAL HIGH (ref 11.5–15.5)
WBC: 9.3 10*3/uL (ref 4.0–10.5)
WBC: 9.9 10*3/uL (ref 4.0–10.5)
WBC: 11.7 10*3/uL — ABNORMAL HIGH (ref 4.0–10.5)
nRBC: 0 % (ref 0.0–0.2)
nRBC: 0 % (ref 0.0–0.2)
nRBC: 0 % (ref 0.0–0.2)

## 2021-12-21 LAB — TYPE AND SCREEN
ABO/RH(D): O NEG
Antibody Screen: NEGATIVE

## 2021-12-21 LAB — HEMOGLOBIN AND HEMATOCRIT, BLOOD
HCT: 23.2 % — ABNORMAL LOW (ref 36.0–46.0)
HCT: 22.3 % — ABNORMAL LOW (ref 36.0–46.0)
Hemoglobin: 7.2 g/dL — ABNORMAL LOW (ref 12.0–15.0)
Hemoglobin: 7.1 g/dL — ABNORMAL LOW (ref 12.0–15.0)

## 2021-12-21 LAB — BASIC METABOLIC PANEL
Anion gap: 10 (ref 5–15)
BUN: 16 mg/dL (ref 8–23)
CO2: 24 mmol/L (ref 22–32)
Calcium: 9.3 mg/dL (ref 8.9–10.3)
Chloride: 99 mmol/L (ref 98–111)
Creatinine, Ser: 0.78 mg/dL (ref 0.44–1.00)
GFR, Estimated: >60 mL/min (ref >60)
Glucose, Bld: 106 mg/dL — ABNORMAL HIGH (ref 70–99)
Potassium: 4.4 mmol/L (ref 3.5–5.1)
Sodium: 133 mmol/L — ABNORMAL LOW (ref 135–145)

## 2021-12-21 LAB — PROTIME-INR
INR: 1 (ref 0.8–1.2)
Prothrombin Time: 13.5 seconds (ref 11.4–15.2)

## 2021-12-21 MED ORDER — LEFLUNOMIDE 20 MG PO TABS
20.0000 mg | ORAL_TABLET | Freq: Every morning | ORAL | Status: DC
  Administered 2021-12-22: 20 mg via ORAL
  Filled 2021-12-21 (×2): qty 1

## 2021-12-21 MED ORDER — SODIUM CHLORIDE 0.9 % IV BOLUS (SEPSIS)
1000.0000 mL | Freq: Once | INTRAVENOUS | Status: AC
  Administered 2021-12-21: 1000 mL via INTRAVENOUS

## 2021-12-21 MED ORDER — PANTOPRAZOLE SODIUM 40 MG PO TBEC
40.0000 mg | DELAYED_RELEASE_TABLET | Freq: Every day | ORAL | Status: DC
  Administered 2021-12-21 – 2021-12-22 (×2): 40 mg via ORAL
  Filled 2021-12-21 (×2): qty 1

## 2021-12-21 MED ORDER — MIRABEGRON ER 25 MG PO TB24
25.0000 mg | ORAL_TABLET | Freq: Every day | ORAL | Status: DC
  Administered 2021-12-21 – 2021-12-22 (×2): 25 mg via ORAL
  Filled 2021-12-21 (×2): qty 1

## 2021-12-21 MED ORDER — LIDOCAINE HCL (PF) 1 % IJ SOLN
10.0000 mL | Freq: Once | INTRAMUSCULAR | Status: AC
  Administered 2021-12-21: 10 mL via INTRADERMAL
  Filled 2021-12-21: qty 10

## 2021-12-21 MED ORDER — DIGOXIN 125 MCG PO TABS
0.1250 mg | ORAL_TABLET | Freq: Every day | ORAL | Status: DC
  Administered 2021-12-21 – 2021-12-22 (×2): 0.125 mg via ORAL
  Filled 2021-12-21 (×2): qty 1

## 2021-12-21 MED ORDER — ACETAMINOPHEN 325 MG PO TABS: 650.0000 mg | ORAL_TABLET | Freq: Four times a day (QID) | ORAL | Status: DC | PRN

## 2021-12-21 MED ORDER — METOPROLOL SUCCINATE ER 50 MG PO TB24
50.0000 mg | ORAL_TABLET | Freq: Every day | ORAL | Status: DC
  Administered 2021-12-21 – 2021-12-22 (×2): 50 mg via ORAL
  Filled 2021-12-21: qty 1
  Filled 2021-12-21: qty 2

## 2021-12-21 MED ORDER — ATORVASTATIN CALCIUM 10 MG PO TABS
20.0000 mg | ORAL_TABLET | Freq: Every day | ORAL | Status: DC
  Administered 2021-12-21 – 2021-12-22 (×2): 20 mg via ORAL
  Filled 2021-12-21 (×2): qty 2

## 2021-12-21 MED ORDER — LEVOTHYROXINE SODIUM 88 MCG PO TABS
88.0000 ug | ORAL_TABLET | Freq: Every day | ORAL | Status: DC
  Administered 2021-12-22: 88 ug via ORAL
  Filled 2021-12-21: qty 1

## 2021-12-21 MED ORDER — MIRTAZAPINE 15 MG PO TABS
15.0000 mg | ORAL_TABLET | Freq: Every day | ORAL | Status: DC
  Administered 2021-12-21: 15 mg via ORAL
  Filled 2021-12-21: qty 1

## 2021-12-21 MED ORDER — FERROUS SULFATE 325 (65 FE) MG PO TABS
325.0000 mg | ORAL_TABLET | Freq: Two times a day (BID) | ORAL | Status: DC
  Administered 2021-12-21 – 2021-12-22 (×3): 325 mg via ORAL
  Filled 2021-12-21 (×3): qty 1

## 2021-12-21 MED ORDER — ORAL CARE MOUTH RINSE: 15.0000 mL | OROMUCOSAL | Status: DC | PRN

## 2021-12-21 MED ORDER — POLYETHYLENE GLYCOL 3350 17 G PO PACK
17.0000 g | PACK | Freq: Every day | ORAL | Status: DC | PRN
  Administered 2021-12-22: 17 g via ORAL
  Filled 2021-12-21: qty 1

## 2021-12-21 MED ORDER — ACETAMINOPHEN 650 MG RE SUPP: 650.0000 mg | Freq: Four times a day (QID) | RECTAL | Status: DC | PRN

## 2021-12-21 NOTE — H&P (Signed)
History and Physical    Kathy Velez EHU:[id number] DOB: May 10, 1931 DOA: 12/20/2021  PCP: Virgie Dad, MD  Patient coming from: ALF  Chief Complaint: Scalp bleeding  HPI: Kathy Velez is a 88 y.o. female with medical history significant of persistent A-fib, colon cancer in remission, GERD, hypertension, hyperlipidemia, hypothyroidism, rheumatoid arthritis, frequent falls, chronic hyponatremia.  History of closed head injury secondary to fall on 7/30.  Recently admitted 9/13-9/14 for scalp hematoma with associated arterial bleed for which she underwent vessel tie off and staples in the ED.  CT was negative for acute intracranial abnormality; showing a small extracranial hematoma overlying the right parietal bone without evidence of calvarial fracture.  Eliquis was held at discharge.  Patient subsequently seen by cardiology on 9/22 and started on aspirin 81 mg daily.  Seen by PCP yesterday and noted to have worsening odor of scalp hematoma and started on doxycycline due to concern for cellulitis.  She returns to the ED today from her assisted living facility for evaluation of recurrent bleeding from scalp hematoma which started after she strained to have a bowel movement.  No recent falls or head injury reported.  Nursing facility applied towels to control the bleeding and reportedly saturated 3 hand sized towels with blood.  EMS applied pressure with gauze and bleeding controlled after 7 minutes.  At the time of evaluation by ED physician, active arterial bleed noted at the lesion of the right scalp.  Case was discussed with general surgery (Dr. Bobbye Morton) and bleeding finally controlled after pressure held for several minutes, a total of 8 staples placed by ED physician, placement of combat gauze, and pressure bandage applied to scalp.  Labs showing hemoglobin 9.7> 8.0 (was 8.4 on 9/20). Blood pressure soft and patient was given 1 L normal saline bolus in the ED.  TRH called to admit.  He is hard of  hearing, history provided mostly by daughter at bedside who states since her recent hospital discharge, patient has not had any falls and has not injured her head.  Daughter states the staples placed during recent hospitalization were removed about 3 days ago.  Tonight patient started having bleeding again from her scalp after she straining to have a bowel movement.  Daughter states patient is no longer on Eliquis but instead on aspirin 81 mg daily which was recently started by cardiology.  Patient denies headaches, fever, cough, shortness of breath, chest pain, nausea, vomiting, abdominal pain, diarrhea, or dysuria.  Review of Systems:  Review of Systems  All other systems reviewed and are negative.  Past Medical History:  Diagnosis Date   Allergy    seasonal   Anemia    Anxiety    Arthritis    Back   Atrial fibrillation ([place])    Cancer ([place]) 2006   Colon.  Basal Cell Skin cancer- right arm   Cataract    removed bilateral   Colon cancer ([place])    Constipation due to pain medication therapy    after heart surgery   Dysrhythmia    PAF   GERD (gastroesophageal reflux disease)    Heart murmur    Hyperlipidemia    Hypertension    Hypothyroidism    RA (rheumatoid arthritis) ([place])    Restless leg    Seizures ([place])    after Heart Surgey   Stroke ([place])    TIA- found by neurologist after     Past Surgical History:  Procedure Laterality Date   [place]  Partial    COLON RESECTION  2006   cancer   COLON SURGERY     COLONOSCOPY     EYE SURGERY Bilateral    Cataract   MAXIMUM ACCESS (MAS)POSTERIOR LUMBAR INTERBODY FUSION (PLIF) 2 LEVEL N/A 04/12/2015   Procedure: Lumbar Three-Five Decompression, Pedicle Screw Fixation, and Posteriolateral Arthrodesis;  Surgeon: Erline Levine, MD;  Location: [facility];  Service: Neurosurgery;  Laterality: N/A;  L3-4 L4-5 Maximum access posterior lumbar fusion, possible interbodies and resection of synovial cyst at L4-5    MITRAL VALVE REPAIR  01/20/2013   Gore-tex cords to P1, P2, and P3. Magic suture to posterior medial commisure, #30 Physio 1 ring. Done in [place]   TONSILLECTOMY     about [place]  01/20/2013   #28 TriAd ring done in [place]     reports that she has never smoked. She has been exposed to tobacco smoke. She has never used smokeless tobacco. She reports current alcohol use of about 1.0 standard drink of alcohol per week. She reports that she does not use drugs.  Allergies  Allergen Reactions   Claritin [Loratadine] Other (See Comments)    Listed on MAR Unknown reaction   Codeine Other (See Comments)    "just don't take it well"   Gabapentin Other (See Comments)    Dizziness    Molds & Smuts    Pollen Extract Swelling   Bee Venom Swelling and Rash   Wasp Venom Swelling and Rash    Family History  Problem Relation Age of Onset   Lung disease Mother 50   Heart disease Father 33   Colon polyps Daughter    Alcohol abuse Son    Colon cancer Neg Hx    Stomach cancer Neg Hx    Rectal cancer Neg Hx    Crohn's disease Neg Hx    Esophageal cancer Neg Hx     Prior to Admission medications   Medication Sig Start Date End Date Taking? Authorizing Provider  amoxicillin (AMOXIL) 500 MG capsule Take 500 mg by mouth daily as needed.    [provider]  aspirin EC 81 MG tablet Take 1 tablet (81 mg total) by mouth daily. Swallow whole. 12/15/21   Lendon Colonel, NP  atorvastatin (LIPITOR) 20 MG tablet TAKE 1 TABLET DAILY 06/14/21   Virgie Dad, MD  Cholecalciferol (VITAMIN D) 50 MCG (2000 UT) CAPS Take 2,000 Units by mouth in the morning.    [provider]  digoxin (LANOXIN) 0.125 MG tablet TAKE 1 TABLET DAILY 06/14/21   Virgie Dad, MD  doxycycline (VIBRA-TABS) 100 MG tablet Take 1 tablet (100 mg total) by mouth 2 (two) times daily for 7 days. 12/20/21 12/27/21  Fargo, Amy E, NP  ferrous sulfate 325 (65 FE) MG tablet Take 325 mg by mouth 2  (two) times daily.    [provider]  hydrocortisone cream 1 % Apply 1 Application topically in the morning. To both palms    [provider]  leflunomide (ARAVA) 20 MG tablet Take 20 mg by mouth in the morning. 10/05/19   [provider]  levothyroxine (SYNTHROID) 88 MCG tablet TAKE 1 TABLET DAILY 30 MINUTES BEFORE BREAKFAST ON AN EMPTY STOMACHTAKE 1 TABLET DAILY 30 MINUTES BEFORE BREAKFAST ON AN EMPTY STOMACH 11/24/21   Royal [place]th[place], NP  metoprolol succinate (TOPROL-XL) 50 MG 24 hr tablet TAKE 1 TABLET DAILY 06/14/21   Virgie Dad, MD  mirtazapine (REMERON) 15 MG  tablet TAKE 1 TABLET AT BEDTIME 02/20/21   Virgie Dad, MD  Multiple Vitamin (MULTIVITAMIN) tablet Take 1 tablet by mouth in the morning.    [provider]  Multiple Vitamins-Minerals (PRESERVISION AREDS 2) CAPS Take 1 capsule by mouth 2 (two) times daily.    [provider]  omeprazole (PRILOSEC) 40 MG capsule TAKE 1 CAPSULE DAILY 10/17/21   Virgie Dad, MD  sodium chloride 1 g tablet Take 1 g by mouth in the morning.    [provider]  valsartan (DIOVAN) 80 MG tablet Take 0.5 tablets (40 mg total) by mouth daily. 10/26/21   Royal Hawthorn, NP  Vibegron (GEMTESA) 75 MG TABS Take 32.5 mg by mouth at bedtime. 11/10/21   Royal Hawthorn, NP  vitamin B-12 (CYANOCOBALAMIN) 1000 MCG tablet Take 1,000 mcg by mouth in the morning.    [provider]    Physical Exam: Vitals:   12/20/21 2300 12/21/21 0015 12/21/21 0115 12/21/21 0145  BP: 133/86 131/80 97/62 122/86  Pulse: 87 (!) 104 94 (!) 103  Resp: 18 (!) 22 (!) 21 (!) 21  Temp:      TempSrc:      SpO2: 96% 98% 95% 97%    Physical Exam Vitals reviewed.  Constitutional:      General: She is not in acute distress. HENT:     Head: Normocephalic.  Eyes:     Extraocular Movements: Extraocular movements intact.  Cardiovascular:     Rate and Rhythm: Normal rate and regular rhythm.     Pulses: Normal pulses.   Pulmonary:     Effort: Pulmonary effort is normal. No respiratory distress.     Breath sounds: Normal breath sounds. No wheezing or rales.  Abdominal:     General: Bowel sounds are normal. There is no distension.     Palpations: Abdomen is soft.     Tenderness: There is no abdominal tenderness.  Musculoskeletal:        General: No swelling or tenderness.     Cervical back: Normal range of motion.  Skin:    General: Skin is warm and dry.  Neurological:     General: No focal deficit present.     Mental Status: She is alert and oriented to person, place, and time.     Labs on Admission: I have personally reviewed following labs and imaging studies  CBC: Recent Labs  Lab 12/20/21 2238 12/21/21 0146  WBC 9.3 9.9  HGB 9.7* 8.0*  HCT 29.5* 23.9*  MCV 106.5* 106.7*  PLT 231 161   Basic Metabolic Panel: Recent Labs  Lab 12/20/21 2238  NA 133*  K 4.4  CL 99  CO2 24  GLUCOSE 106*  BUN 16  CREATININE 0.78  CALCIUM 9.3   GFR: Estimated Creatinine Clearance: 38.4 mL/min (by C-G formula based on SCr of 0.78 mg/dL). Liver Function Tests: No results for input(s): "AST", "ALT", "ALKPHOS", "BILITOT", "PROT", "ALBUMIN" in the last 168 hours. No results for input(s): "LIPASE", "AMYLASE" in the last 168 hours. No results for input(s): "AMMONIA" in the last 168 hours. Coagulation Profile: Recent Labs  Lab 12/20/21 2238  INR 1.0   Cardiac Enzymes: No results for input(s): "CKTOTAL", "CKMB", "CKMBINDEX", "TROPONINI" in the last 168 hours. BNP (last 3 results) No results for input(s): "PROBNP" in the last 8760 hours. HbA1C: No results for input(s): "HGBA1C" in the last 72 hours. CBG: No results for input(s): "GLUCAP" in the last 168 hours. Lipid Profile: No results for input(s): "CHOL", "  HDL", "LDLCALC", "TRIG", "CHOLHDL", "LDLDIRECT" in the last 72 hours. Thyroid Function Tests: No results for input(s): "TSH", "T4TOTAL", "FREET4", "T3FREE", "THYROIDAB" in the last 72  hours. Anemia Panel: No results for input(s): "VITAMINB12", "FOLATE", "FERRITIN", "TIBC", "IRON", "RETICCTPCT" in the last 72 hours. Urine analysis:    Component Value Date/Time   COLORURINE YELLOW 10/27/2021 1320   APPEARANCEUR CLEAR 10/27/2021 1320   LABSPEC 1.005 10/27/2021 1320   PHURINE 6.5 10/27/2021 1320   GLUCOSEU NEGATIVE 10/27/2021 1320   HGBUR NEGATIVE 10/27/2021 1320   BILIRUBINUR NEGATIVE 10/27/2021 1320   KETONESUR NEGATIVE 10/27/2021 1320   PROTEINUR NEGATIVE 10/27/2021 1320   NITRITE POSITIVE (A) 10/27/2021 1320   LEUKOCYTESUR LARGE (A) 10/27/2021 1320    Radiological Exams on Admission: No results found.  EKG: Independently reviewed.  Atrial fibrillation, PVCs.  Assessment and Plan  Scalp hematoma with recurrent arterial bleed Acute blood loss anemia -No repeat imaging done in the ED as no recent falls or head injuries reported.  Bleeding started after patient strained to have a bowel movement. -Case was discussed with general surgery (Dr. Bobbye Morton) and bleeding finally controlled after pressure held for several minutes, a total of 8 staples placed by ED physician, placement of combat gauze, and pressure bandage applied to scalp. -Hemoglobin 9.7> 8.0 (was 8.4 on 9/20).  Type and screen, monitor H&H.  Transfuse PRBCs if hemoglobin <7. -Hold aspirin.  She is no longer on anticoagulation. -Needs evaluation by general surgery.  Since bleeding is currently controlled, general surgery can be reconsulted in the morning.  However, if there is recurrence of bleeding tonight, general surgery to be reconsulted immediately.  ?Scalp infection/cellulitis -Seen by PCP yesterday and noted to have worsening odor from scalp hematoma and started on doxycycline due to concern for infection/cellulitis.  Unable to evaluate at this time as patient has pressure bandage in place to control bleeding. -Needs evaluation of scalp by general surgery  Persistent A-fib -Currently rate  controlled. -No longer on Eliquis due to recurrent arterial bleed from scalp hematoma -Hold aspirin -Blood pressure was soft in the ED and required 1 L fluid bolus.  Hold metoprolol at this time.  Resume in the morning if blood pressure remains stable.  Hypertension -Hold antihypertensives at this time  Chronic mild hyponatremia -Stable  Constipation -MiraLAX as needed  Hyperlipidemia Hypothyroidism Rheumatoid arthritis -Resume home meds after pharmacy med rec is done.  DVT prophylaxis: SCDs Code Status: DNR (discussed with the patient and her daughter at bedside) Family Communication: Daughter updated. Level of care: Progressive Care Unit Admission status: It is my clinical opinion that referral for OBSERVATION is reasonable and necessary in this patient based on the above information provided. The aforementioned taken together are felt to place the patient at high risk for further clinical deterioration. However, it is anticipated that the patient may be medically stable for discharge from the hospital within 24 to 48 hours.   Shela Leff MD [facility]  If 7PM-7AM, please contact night-coverage [email or web address]  12/21/2021, 2:06 AM

## 2021-12-21 NOTE — Progress Notes (Signed)
This is a 88 year old female with history of persistent atrial fibrillation but not on any anticoagulation, recently started on aspirin was admitted to the hospital due to persistent bleeding from scalp hematoma and acute blood loss anemia.  Patient's bleeding had stopped in the ED a total of 8 staples placed by ED physician, placement of combat gauze, and pressure bandage applied to scalp.  Patient seen and examined in the ED.  She has no complaints.  Visually, she appears pale but she has no complaint.  No pain.  Hemoglobin is drifting down.  7.2 at 10 AM this morning.  Rechecking at 4 PM.  If drops to 7 or below, will transfuse 1 unit.  She has been seen by general surgery and per my conversation with Dr. Bobbye Morton, they are planning to do punch biopsy at bedside today.    Apparently, patient was also started on antibiotics for possible cellulitis at the wound site but now that wound is clean, I do not see any signs of infection.  No indication of antibiotics.

## 2021-12-21 NOTE — Consult Note (Signed)
Reason for Consult/Chief Complaint: scalp wound Consultant: Christy Gentles, MD  Kathy Velez is an 88 y.o. female.   HPI: [name] with scalp wound since July 2023 who re-presents for spontaneous bleeding from the site that has note healed. Bleeding was able to be controlled by the ED.   Past Medical History:  Diagnosis Date   Allergy    seasonal   Anemia    Anxiety    Arthritis    Back   Atrial fibrillation ([place])    Cancer ([place]) 2006   Colon.  Basal Cell Skin cancer- right arm   Cataract    removed bilateral   Colon cancer ([facility])    Constipation due to pain medication therapy    after heart surgery   Dysrhythmia    PAF   GERD (gastroesophageal reflux disease)    Heart murmur    Hyperlipidemia    Hypertension    Hypothyroidism    RA (rheumatoid arthritis) ([place])    Restless leg    Seizures ([place])    after Heart Surgey   Stroke ([place])    TIA- found by neurologist after     Past Surgical History:  Procedure Laterality Date   ABDOMINAL HYSTERECTOMY  1970   Partial    COLON RESECTION  2006   cancer   COLON SURGERY     COLONOSCOPY     EYE SURGERY Bilateral    Cataract   MAXIMUM ACCESS (MAS)POSTERIOR LUMBAR INTERBODY FUSION (PLIF) 2 LEVEL N/A 04/12/2015   Procedure: Lumbar Three-Five Decompression, Pedicle Screw Fixation, and Posteriolateral Arthrodesis;  Surgeon: Erline Levine, MD;  Location: [facility];  Service: Neurosurgery;  Laterality: N/A;  L3-4 L4-5 Maximum access posterior lumbar fusion, possible interbodies and resection of synovial cyst at L4-5   MITRAL VALVE REPAIR  01/20/2013   Gore-tex cords to P1, P2, and P3. Magic suture to posterior medial commisure, #30 Physio 1 ring. Done in [place]   TONSILLECTOMY     about [place]  01/20/2013   #28 TriAd ring done in [place]    Family History  Problem Relation Age of Onset   Lung disease Mother 77   Heart disease Father 24   Colon polyps Daughter    Alcohol abuse Son    Colon cancer Neg Hx     Stomach cancer Neg Hx    Rectal cancer Neg Hx    Crohn's disease Neg Hx    Esophageal cancer Neg Hx     Social History:  reports that she has never smoked. She has been exposed to tobacco smoke. She has never used smokeless tobacco. She reports current alcohol use of about 1.0 standard drink of alcohol per week. She reports that she does not use drugs.  Allergies:  Allergies  Allergen Reactions   Claritin [Loratadine] Other (See Comments)    Listed on MAR Unknown reaction   Codeine Other (See Comments)    "just don't take it well"   Gabapentin Other (See Comments)    Dizziness    Molds & Smuts     dust   Pollen Extract Swelling    Grass   Bee Venom Swelling and Rash   Wasp Venom Swelling and Rash    Medications: I have reviewed the patient's current medications.  Results for orders placed or performed during the hospital encounter of 12/20/21 (from the past 48 hour(s))  Type and screen [place]     Status: None   Collection Time:  12/20/21 10:30 PM  Result Value Ref Range   ABO/RH(D) O NEG    Antibody Screen NEG    Sample Expiration      12/23/2021,2359 Performed at [facility] [address] [place], [place] [address]   CBC     Status: Abnormal   Collection Time: 12/20/21 10:38 PM  Result Value Ref Range   WBC 9.3 4.0 - 10.5 K/uL   RBC 2.77 (L) 3.87 - 5.11 MIL/uL   Hemoglobin 9.7 (L) 12.0 - 15.0 g/dL   HCT 29.5 (L) 36.0 - 46.0 %   MCV 106.5 (H) 80.0 - 100.0 fL   MCH 35.0 (H) 26.0 - 34.0 pg   MCHC 32.9 30.0 - 36.0 g/dL   RDW 17.7 (H) 11.5 - 15.5 %   Platelets 231 150 - 400 K/uL   nRBC 0.0 0.0 - 0.2 %    Comment: Performed at [facility], [place] [address] [place], [place] [address]  Protime-INR     Status: None   Collection Time: 12/20/21 10:38 PM  Result Value Ref Range   Prothrombin Time 13.5 11.4 - 15.2 seconds   INR 1.0 0.8 - 1.2    Comment: (NOTE) INR goal varies based on device and disease states. Performed at [facility], [place] [address] [place], [place] [address]   Basic metabolic panel     Status: Abnormal   Collection Time: 12/20/21 10:38 PM  Result Value Ref Range   Sodium 133 (L) 135 - 145 mmol/L   Potassium 4.4 3.5 - 5.1 mmol/L   Chloride 99 98 - 111 mmol/L   CO2 24 22 - 32 mmol/L   Glucose, Bld 106 (H) 70 - 99 mg/dL    Comment: Glucose reference range applies only to samples taken after fasting for at least 8 hours.   BUN 16 8 - 23 mg/dL   Creatinine, Ser 0.78 0.44 - 1.00 mg/dL   Calcium 9.3 8.9 - 10.3 mg/dL   GFR, Estimated >60 >60 mL/min    Comment: (NOTE) Calculated using the CKD-EPI Creatinine Equation (2021)    Anion gap 10 5 - 15    Comment: Performed at [facility] [address] [place], [place] [address]  CBC     Status: Abnormal   Collection Time: 12/21/21  1:46 AM  Result Value Ref Range   WBC 9.9 4.0 - 10.5 K/uL   RBC 2.24 (L) 3.87 - 5.11 MIL/uL   Hemoglobin 8.0 (L) 12.0 - 15.0 g/dL   HCT 23.9 (L) 36.0 - 46.0 %   MCV 106.7 (H) 80.0 - 100.0 fL   MCH 35.7 (H) 26.0 - 34.0 pg   MCHC 33.5 30.0 - 36.0 g/dL   RDW 17.7 (H) 11.5 - 15.5 %   Platelets 237 150 - 400 K/uL   nRBC 0.0 0.0 - 0.2 %    Comment: Performed at [facility], [place] [address], [place], [place] [address]  CBC     Status: Abnormal   Collection Time: 12/21/21  3:18 AM  Result Value Ref Range   WBC 11.7 (H) 4.0 - 10.5 K/uL   RBC 2.12 (L) 3.87 - 5.11 MIL/uL   Hemoglobin 7.4 (L) 12.0 - 15.0 g/dL   HCT 23.2 (L) 36.0 - 46.0 %   MCV 109.4 (H) 80.0 - 100.0 fL   MCH 34.9 (H) 26.0 - 34.0 pg   MCHC 31.9 30.0 - 36.0 g/dL   RDW 17.9 (H) 11.5 -  15.5 %   Platelets 216 150 - 400 K/uL   nRBC 0.0 0.0 - 0.2 %    Comment: Performed at [facility], [place] [address] [place], [place] [address]    No results found.  ROS 10 point review of systems is negative except as listed above in HPI.   Physical Exam Blood pressure 136/74, pulse 82, temperature 98 F (36.7 C), temperature source Oral,  resp. rate 18, height '5\' 2"'$  (1.575 m), weight 54 kg, SpO2 100 %. Constitutional: well-developed, well-nourished HEENT: pupils equal, round, reactive to light, 63m b/l, moist conjunctiva, external inspection of ears and nose normal, hearing intact, quarter-sized crater shaped wound of R temporal scalp, hemostatic Oropharynx: normal oropharyngeal mucosa, poor dentition Neck: no thyromegaly, trachea midline, no midline cervical tenderness to palpation Chest: breath sounds equal bilaterally, normal respiratory effort, no midline or lateral chest wall tenderness to palpation/deformity Abdomen: soft, NT, no bruising, no hepatosplenomegaly GU: normal female genitalia  Back: no wounds, no thoracic/lumbar spine tenderness to palpation, no thoracic/lumbar spine stepoffs Rectal: deferred Extremities: 2+ radial and pedal pulses bilaterally, intact motor and sensation bilateral UE and LE, no peripheral edema MSK: unable to assess gait/station, no clubbing/cyanosis of fingers/toes, normal ROM of all four extremities Skin: warm, dry, no rashes Psych: normal memory, normal mood/affect     Assessment/Plan: 70F with nonhealing bleeding scalp wound that has a crater/ulcer appearance. Hemostasis achieved. Consider biopsy to r/o malignancy.    AJesusita Oka MD General and [facility]

## 2021-12-21 NOTE — ED Notes (Signed)
Breakfast Ordered 

## 2021-12-21 NOTE — ED Notes (Signed)
Pt's bleeding controlled at this time.

## 2021-12-21 NOTE — Procedures (Addendum)
Skin Biopsy Procedure Note   Procedure: Punch biopsy   Pre-operative Diagnosis: Non-healing scalp wound   Post-procedure Diagnosis: same   Indications: This is a 88 year old female with a scalp wound since July 2023 who presented to the ED yesterday for bleeding. She was evaluated my my attendings. Given the chronicity of her wound and non-healing component, decision was made to proceed with skin/punch bx to r/o etiologies such as malignancy.    Anesthesia: lidocaine 1%   Procedure Details  The procedure, risks and complications have been discussed in detail (including, but not limited to pain, infection, bleeding, scarring, non-healing, inadequate sampling, need for additional procedures) with the patient and her daughter who is her reported HCPOA. The patient wishes to proceed. Informed consent was obtained verbally. Patient is A&O x 4 and requested her daughter who is her HCPOA sign the written consent form. This was witnessed by RN and placed in her chart.   The skin was sterilely prepped over the affected area in the usual fashion. Using a 63m punch a skin biopsy was taken from the scalp x 2 (one in wound bed, one near skin edge). Specimen placed in formalin specimen cup (discussed with path before the procedure to ensure this was the correct solution they would like specimens placed in). Pressure held until hemostasis reached. My attending I discussed dressings and decision was made to place surgicel in wound bed with dry dressing over. This can be removed in the AM. The patient was observed until stable. There were no complications, and the patient tolerated the procedure well. I directly handed her pathology to path.  Questions from patient and her daughter were answered. Her daughter did ask to ensure follow up with her wound. After discussion with my attending, would recommend plastics f/u at discharge given the chronicity of the wound. WOCN has already been consulted for wound care  recommendations - please ensure to include this in her discharge instructions. We will arrange follow up in our office for a nurse visit. Please ensure her path is followed up once resulted. Discussed this with primary team.   Plan:  - Two 433mpunch biopsies performed and sent for pathology.   [facility] 12/21/21, 3:41 PM

## 2021-12-22 DIAGNOSIS — S0003XD Contusion of scalp, subsequent encounter: Secondary | ICD-10-CM | POA: Diagnosis not present

## 2021-12-22 DIAGNOSIS — M7981 Nontraumatic hematoma of soft tissue: Secondary | ICD-10-CM | POA: Diagnosis not present

## 2021-12-22 LAB — BASIC METABOLIC PANEL
Anion gap: 8 (ref 5–15)
BUN: 14 mg/dL (ref 8–23)
CO2: 22 mmol/L (ref 22–32)
Calcium: 8.9 mg/dL (ref 8.9–10.3)
Chloride: 104 mmol/L (ref 98–111)
Creatinine, Ser: 0.85 mg/dL (ref 0.44–1.00)
GFR, Estimated: >60 mL/min (ref >60)
Glucose, Bld: 91 mg/dL (ref 70–99)
Potassium: 4.4 mmol/L (ref 3.5–5.1)
Sodium: 134 mmol/L — ABNORMAL LOW (ref 135–145)

## 2021-12-22 LAB — CBC
HCT: 24.8 % — ABNORMAL LOW (ref 36.0–46.0)
Hemoglobin: 8.1 g/dL — ABNORMAL LOW (ref 12.0–15.0)
MCH: 35.5 pg — ABNORMAL HIGH (ref 26.0–34.0)
MCHC: 32.7 g/dL (ref 30.0–36.0)
MCV: 108.8 fL — ABNORMAL HIGH (ref 80.0–100.0)
Platelets: 206 10*3/uL (ref 150–400)
RBC: 2.28 MIL/uL — ABNORMAL LOW (ref 3.87–5.11)
RDW: 18.2 % — ABNORMAL HIGH (ref 11.5–15.5)
WBC: 8.6 10*3/uL (ref 4.0–10.5)
nRBC: 0 % (ref 0.0–0.2)

## 2021-12-22 MED ORDER — MEDIHONEY WOUND/BURN DRESSING EX PSTE
1.0000 | PASTE | Freq: Every day | CUTANEOUS | Status: DC
  Administered 2021-12-22: 1 via TOPICAL
  Filled 2021-12-22: qty 44

## 2021-12-22 MED ORDER — DOCUSATE SODIUM 100 MG PO CAPS: 100.0000 mg | ORAL_CAPSULE | Freq: Two times a day (BID) | ORAL | Status: DC

## 2021-12-22 NOTE — Consult Note (Signed)
[place] Nurse wound follow up I understand from a Secure Chat message from the patient's primary RN Jenean Lindau, that the patient's daughter is requesting a WOC consult for wound care instructions for the scalp hematoma area, and that the surgical team has deferred to our [facility] nurse team for those instructions.  I have reached out to my colleague, D. Tenny Craw, who is present at [facility] today, and requested that she see the patient and respond to the consult request.  She has agreed to do so. Val Riles, RN, MSN, CWOCN, CNS-BC, pager [phone]

## 2021-12-22 NOTE — TOC Transition Note (Addendum)
Transition of Care [facility]) - CM/SW Discharge Note   Patient Details  Name: Kathy Velez MRN: [id number] Date of Birth: [birth year]  Transition of Care [facility]) CM/SW Contact:  Vinie Sill, LCSW Phone Number: 12/22/2021, 2:20 PM   Clinical Narrative:     [facility] - informed of [place] orders. CSW was advised  HH/RN order was not needed. They have nursing available. RNCM updated   Patient will Discharge to: [facility] Discharge Date: 12/22/2021 Family Notified: daughter Transport By: daughter  Per MD patient is ready for discharge. RN, patient, and facility notified of discharge. Discharge Summary sent to facility. RN given number for [phone] or [phone]. Ambulance transport requested for patient.   Clinical Social Worker signing off.  Thurmond Butts, MSW, LCSW Clinical Social Worker       Barriers to Discharge: Barriers Resolved   Patient Goals and CMS Choice        Discharge Placement                       Discharge Plan and Services                                     Social Determinants of Health (SDOH) Interventions     Readmission Risk Interventions     No data to display

## 2021-12-22 NOTE — Consult Note (Signed)
[facility] Nurse Consult Note: Patient receiving care in [place]. Reason for Consult: scalp wound Wound type: please see the multiple notes from medical providers from this presentation to the ED until yesterday at 1527 by the surgical PA, M. Maczis.  This is a chronic, non-healing hematoma that started weeks ago.  It has been evaluated by the surgical team during this admission.  They have used specialized dressings in an attempt to control the bleeding, and performed punch biopsies.  Please consult the [facility] surgical team for any additional concerns and requests for wound care guidance. [facility] nurse will not follow at this time.  Please re-consult the [facility] team if needed.  Val Riles, RN, MSN, CWOCN, CNS-BC, pager [phone]

## 2021-12-22 NOTE — Consult Note (Addendum)
[facility] Nurse Consult Note: Reason for Consult: Consult requested for scalp wound. Surgical team assessed the site on 9/28, and again today when they performed a biopsy.  Daughter at the bedside states wound started as a hematoma after a fall several weeks ago and had significant bleed, staples were placed at that time. She is requesting assistance with topical treatment recommendations to prepare for eventual discharge to a SNF.  Wound type: Full thickness wound to right outer head; currently no bleeding.  50% dry scabbed clotted blood with staples in place; 50% grey moist woundbed with small amt tan drainage. 1X1X.3cm Hair surrounding has large amt matted dried clotted blood.  Dressing procedure/placement/frequency: Attempted to wash blood out of hair using the shampoo cap. Large amt remains and it may take several times. Topical treatment orders provided for bedside nurses to perform as follows to assist with removal of nonviable tissue.  Pt will need staples removed from the scabbed area within 7-10 days.  Apply Medihoney to head wound Q day, then cover with Foam dressing.   Please re-consult if further assistance is needed.   Recommend referral to Plastic surgery team after discharge; primary team please please order if desired. Secure chat message sent to inform them. Please re-consult if further assistance is needed.  Thank-you,  Julien Girt MSN, [place], [place], [place], [place]

## 2021-12-22 NOTE — Discharge Instructions (Addendum)
Pt will need staples removed from the scabbed area within 7-10 days. Follow up with General Surgery Outpatient for appointment to remove staples  Wound Care Instructions Apply enough Medihoney to cover head wound by applying into incisional cavity and on scabbed incisions EVERY day, then cover with Foam dressing. Follow up with wound care  Referral to Plastic surgery team after discharge for follow up

## 2021-12-22 NOTE — Evaluation (Signed)
Occupational Therapy Evaluation Patient Details Name: Kathy Velez MRN: [id number] DOB: [birth year] Today's Date: 12/22/2021   History of Present Illness 88 year old female presents to [facility] ED on 12/21/21 with persistent bleeding of scalp hematoma 3 days post staple removal and low hemoglobin. Pt with fall on 10/22/21 injuring head. Admitted 12/06/21-12/07/21 with open scalp wound, underwent vessle tie off with staples. PHMx: atrial fibrillation, hypothyroidism, gastroesophageal reflux disease, hypertension, rheumatoid arthritis, mitral valve and tricuspid repair, adenocarcinoma of the colon (2006 S/P right colectomy with recurrence 01/2021 S/P Pembrolizumab cycles x 4), prior TIA and CVA, rheumatoid arthritis.   Clinical Impression   Pt typically walks with a rollator and is assisted for IADLs and likely for showering. She reports she toilets, dresses and grooms herself at her ALF. Pt presents with generalized weakness and impaired standing balance. She needs min guard assist with RW for ambulation and set up to min assist for ADLs. She is likely near her baseline. Will follow acutely, recommend HHOT.      Recommendations for follow up therapy are one component of a multi-disciplinary discharge planning process, led by the attending physician.  Recommendations may be updated based on patient status, additional functional criteria and insurance authorization.   Follow Up Recommendations  Home health OT    Assistance Recommended at Discharge Intermittent Supervision/Assistance  Patient can return home with the following A little help with walking and/or transfers;A little help with bathing/dressing/bathroom;Assistance with cooking/housework;Assist for transportation;Direct supervision/assist for financial management;Direct supervision/assist for medications management    Functional Status Assessment  Patient has had a recent decline in their functional status and demonstrates the ability to make  significant improvements in function in a reasonable and predictable amount of time.  Equipment Recommendations  None recommended by OT    Recommendations for Other Services       Precautions / Restrictions Precautions Precautions: Fall Restrictions Weight Bearing Restrictions: No      Mobility Bed Mobility               General bed mobility comments: Sitting at EOB    Transfers Overall transfer level: Needs assistance Equipment used: Rolling walker (2 wheels) Transfers: Sit to/from Stand Sit to Stand: Min guard           General transfer comment: Min guard for safety      Balance Overall balance assessment: Needs assistance Sitting-balance support: No upper extremity supported, Feet supported Sitting balance-Leahy Scale: Good     Standing balance support: Bilateral upper extremity supported Standing balance-Leahy Scale: Fair Standing balance comment: Able to maintain static standing without UE support                           ADL either performed or assessed with clinical judgement   ADL Overall ADL's : Needs assistance/impaired Eating/Feeding: Set up;Sitting   Grooming: Wash/dry hands;Wash/dry face;Sitting;Set up   Upper Body Bathing: Minimal assistance;Sitting   Lower Body Bathing: Min guard;Sit to/from stand   Upper Body Dressing : Set up;Sitting   Lower Body Dressing: Min guard;Sit to/from stand   Toilet Transfer: Min guard;Ambulation;BSC/3in1;Rolling walker (2 wheels)   Toileting- Clothing Manipulation and Hygiene: Min guard;Sit to/from stand       Functional mobility during ADLs: Min guard;Rolling walker (2 wheels)       Vision Ability to See in Adequate Light: 0 Adequate Patient Visual Report: No change from baseline       Perception     Praxis  Pertinent Vitals/Pain Pain Assessment Pain Assessment: No/denies pain     Hand Dominance Right   Extremity/Trunk Assessment Upper Extremity Assessment Upper  Extremity Assessment: Overall WFL for tasks assessed (arthritic changes in hands)   Lower Extremity Assessment Lower Extremity Assessment: Defer to PT evaluation   Cervical / Trunk Assessment Cervical / Trunk Assessment: Kyphotic   Communication Communication Communication: HOH   Cognition Arousal/Alertness: Awake/alert Behavior During Therapy: WFL for tasks assessed/performed Overall Cognitive Status: No family/caregiver present to determine baseline cognitive functioning                                 General Comments: likely baseline     General Comments       Exercises     Shoulder Instructions      Home Living Family/patient expects to be discharged to:: Assisted living                             Home Equipment: Rollator (4 wheels)   Additional Comments: [name]      Prior Functioning/Environment Prior Level of Function : Needs assist             Mobility Comments: Uses rollator for ambulation ADLs Comments: receives assist for IADLs, likely for showering        OT Problem List: Impaired balance (sitting and/or standing)      OT Treatment/Interventions: Self-care/ADL training;DME and/or AE instruction;Patient/family education;Balance training    OT Goals(Current goals can be found in the care plan section) Acute Rehab OT Goals OT Goal Formulation: With patient Time For Goal Achievement: 01/04/22 Potential to Achieve Goals: Good ADL Goals Pt Will Perform Grooming: with modified independence;standing Pt Will Perform Lower Body Bathing: with supervision;sit to/from stand Pt Will Perform Lower Body Dressing: with modified independence;sit to/from stand Pt Will Transfer to Toilet: with modified independence;ambulating;bedside commode Pt Will Perform Toileting - Clothing Manipulation and hygiene: with modified independence;sit to/from stand  OT Frequency: Min 2X/week    Co-evaluation              AM-PAC OT "6  Clicks" Daily Activity     Outcome Measure Help from another person eating meals?: None Help from another person taking care of personal grooming?: A Little Help from another person toileting, which includes using toliet, bedpan, or urinal?: A Little Help from another person bathing (including washing, rinsing, drying)?: A Little Help from another person to put on and taking off regular upper body clothing?: None Help from another person to put on and taking off regular lower body clothing?: A Little 6 Click Score: 20   End of Session Equipment Utilized During Treatment: Gait belt;Rolling walker (2 wheels) Nurse Communication: Mobility status  Activity Tolerance: Patient tolerated treatment well Patient left: in chair;with chair alarm set;with call bell/phone within reach  OT Visit Diagnosis: Unsteadiness on feet (R26.81);Other abnormalities of gait and mobility (R26.89);Muscle weakness (generalized) (M62.81);Other symptoms and signs involving cognitive function                Time: 0912-0940 OT Time Calculation (min): 28 min Charges:  OT General Charges $OT Visit: 1 Visit OT Evaluation $OT Eval Moderate Complexity: [place], OTR/L Acute Rehabilitation Services Office: [phone]   Malka So 12/22/2021, 12:17 PM

## 2021-12-22 NOTE — Evaluation (Signed)
Physical Therapy Evaluation Patient Details Name: Kathy Velez MRN: [id number] DOB: 13-Nov-1931 Today's Date: 12/22/2021  History of Present Illness  88 year old female presents to [facility] ED on 12/21/21 with persistent bleeding of scalp hematoma 3 days post staple removal and low hemoglobin. Pt with fall on 10/22/21 injuring head. Admitted 12/06/21-12/07/21 with open scalp wound, underwent vessle tie off with staples. PHMx: atrial fibrillation, hypothyroidism, gastroesophageal reflux disease, hypertension, rheumatoid arthritis, mitral valve and tricuspid repair, adenocarcinoma of the colon (2006 S/P right colectomy with recurrence 01/2021 S/P Pembrolizumab cycles x 4), prior TIA and CVA, rheumatoid arthritis.  Clinical Impression  Pt admitted secondary to problem above with deficits below. Pt requiring min guard for mobility tasks this session using RW. Brief period where HR elevated to mid 140s, but quickly returned to mid 120s. Pt asymptomatic throughout. Recommending PT follow up at ALF. Will continue to follow acutely.        Recommendations for follow up therapy are one component of a multi-disciplinary discharge planning process, led by the attending physician.  Recommendations may be updated based on patient status, additional functional criteria and insurance authorization.  Follow Up Recommendations Home health PT (at ALF)      Assistance Recommended at Discharge Intermittent Supervision/Assistance  Patient can return home with the following  A little help with walking and/or transfers;A little help with bathing/dressing/bathroom;Assistance with cooking/housework;Assist for transportation;Help with stairs or ramp for entrance    Equipment Recommendations None recommended by PT  Recommendations for Other Services       Functional Status Assessment Patient has had a recent decline in their functional status and demonstrates the ability to make significant improvements in function in a  reasonable and predictable amount of time.     Precautions / Restrictions Precautions Precautions: Fall Restrictions Weight Bearing Restrictions: No      Mobility  Bed Mobility               General bed mobility comments: Sitting at EOB    Transfers Overall transfer level: Needs assistance Equipment used: Rolling walker (2 wheels) Transfers: Sit to/from Stand Sit to Stand: Min guard           General transfer comment: Min guard for safety    Ambulation/Gait Ambulation/Gait assistance: Min guard Gait Distance (Feet): 75 Feet Assistive device: Rolling walker (2 wheels) Gait Pattern/deviations: Step-through pattern, Decreased stride length Gait velocity: Decreased     General Gait Details: Min guard for safety. Mild unsteadiness, but no overt LOB. HR with brief elevation to mid 140s but quickly returned to mid 120s.  Stairs            Wheelchair Mobility    Modified Rankin (Stroke Patients Only)       Balance Overall balance assessment: Needs assistance Sitting-balance support: No upper extremity supported, Feet supported Sitting balance-Leahy Scale: Good     Standing balance support: Bilateral upper extremity supported Standing balance-Leahy Scale: Fair Standing balance comment: Able to maintain static standing without UE support                             Pertinent Vitals/Pain Pain Assessment Pain Assessment: No/denies pain    Home Living Family/patient expects to be discharged to:: Assisted living                 Home Equipment: Rollator (4 wheels) Additional Comments: [facility]    Prior Function Prior Level of Function : Independent/Modified Independent  Mobility Comments: Uses rollator for ambulation ADLs Comments: Pt reports she is able to bathe and dress herself.     Hand Dominance        Extremity/Trunk Assessment   Upper Extremity Assessment Upper Extremity Assessment: Defer to OT  evaluation    Lower Extremity Assessment Lower Extremity Assessment: Generalized weakness    Cervical / Trunk Assessment Cervical / Trunk Assessment: Kyphotic  Communication   Communication: HOH  Cognition Arousal/Alertness: Awake/alert Behavior During Therapy: WFL for tasks assessed/performed Overall Cognitive Status: No family/caregiver present to determine baseline cognitive functioning                                 General Comments: Slow processing and some memory deficits noted.        General Comments      Exercises     Assessment/Plan    PT Assessment Patient needs continued PT services  PT Problem List Decreased strength;Decreased activity tolerance;Decreased balance;Decreased mobility;Decreased cognition;Decreased knowledge of use of DME;Decreased safety awareness;Decreased knowledge of precautions       PT Treatment Interventions DME instruction;Gait training;Functional mobility training;Therapeutic activities;Balance training;Therapeutic exercise;Patient/family education;Cognitive remediation    PT Goals (Current goals can be found in the Care Plan section)  Acute Rehab PT Goals Patient Stated Goal: to go home PT Goal Formulation: With patient Time For Goal Achievement: 01/05/22 Potential to Achieve Goals: Good    Frequency Min 3X/week     Co-evaluation               AM-PAC PT "6 Clicks" Mobility  Outcome Measure Help needed turning from your back to your side while in a flat bed without using bedrails?: None Help needed moving from lying on your back to sitting on the side of a flat bed without using bedrails?: A Little Help needed moving to and from a bed to a chair (including a wheelchair)?: A Little Help needed standing up from a chair using your arms (e.g., wheelchair or bedside chair)?: A Little Help needed to walk in hospital room?: A Little Help needed climbing 3-5 steps with a railing? : A Lot 6 Click Score: 18     End of Session Equipment Utilized During Treatment: Gait belt Activity Tolerance: Patient tolerated treatment well Patient left: in chair;with call bell/phone within reach;with chair alarm set Nurse Communication: Mobility status PT Visit Diagnosis: Unsteadiness on feet (R26.81);Muscle weakness (generalized) (M62.81);History of falling (Z91.81)    Time: 2951-8841 PT Time Calculation (min) (ACUTE ONLY): 24 min   Charges:   PT Evaluation $PT Eval Low Complexity: 1 Low          Reuel Derby, PT, DPT  Acute Rehabilitation Services  Office: [phone]   Rudean Hitt 12/22/2021, 9:45 AM

## 2021-12-22 NOTE — Discharge Summary (Signed)
Physician Discharge Summary  Kathy Velez DZH:[id number] DOB: Apr 12, 1931 DOA: 12/20/2021  PCP: Virgie Dad, MD  Admit date: 12/20/2021 Discharge date: 12/22/2021    Admitted From: Assisted living Disposition: Assist living  Recommendations for Outpatient Follow-up:  Follow up with PCP in 1-2 weeks for hospital follow-up as well as removal of the staples. Please obtain BMP/CBC in one week Follow-up with general surgery if PCP is not comfortable for the removal of his staples. Follow up with wound care as a scheduled on 01/01/2022. Please follow up with your PCP on the following pending results: Unresulted Labs (From admission, onward)    None         Home Health: Yes Equipment/Devices: None  Discharge Condition: Stable CODE STATUS: DNR Diet recommendation: Cardiac  Subjective: Patient seen and examined.  She has no complaints.  She is fully alert and oriented.  HPI: Kathy Velez is a 88 y.o. female with medical history significant of persistent A-fib, colon cancer in remission, GERD, hypertension, hyperlipidemia, hypothyroidism, rheumatoid arthritis, frequent falls, chronic hyponatremia.  History of closed head injury secondary to fall on 7/30.  Recently admitted 9/13-9/14 for scalp hematoma with associated arterial bleed for which she underwent vessel tie off and staples in the ED.  CT was negative for acute intracranial abnormality; showing a small extracranial hematoma overlying the right parietal bone without evidence of calvarial fracture.  Eliquis was held at discharge.  Patient subsequently seen by cardiology on 9/22 and started on aspirin 81 mg daily.  Seen by PCP yesterday and noted to have worsening odor of scalp hematoma and started on doxycycline due to concern for cellulitis.   She returns to the ED today from her assisted living facility for evaluation of recurrent bleeding from scalp hematoma which started after she strained to have a bowel movement.  No recent falls  or head injury reported.  Nursing facility applied towels to control the bleeding and reportedly saturated 3 hand sized towels with blood.  EMS applied pressure with gauze and bleeding controlled after 7 minutes.  At the time of evaluation by ED physician, active arterial bleed noted at the lesion of the right scalp.  Case was discussed with general surgery (Dr. Bobbye Morton) and bleeding finally controlled after pressure held for several minutes, a total of 8 staples placed by ED physician, placement of combat gauze, and pressure bandage applied to scalp.  Labs showing hemoglobin 9.7> 8.0 (was 8.4 on 9/20). Blood pressure soft and patient was given 1 L normal saline bolus in the ED.  [facility] called to admit.   He is hard of hearing, history provided mostly by daughter at bedside who states since her recent hospital discharge, patient has not had any falls and has not injured her head.  Daughter states the staples placed during recent hospitalization were removed about 3 days ago.  Tonight patient started having bleeding again from her scalp after she straining to have a bowel movement.  Daughter states patient is no longer on Eliquis but instead on aspirin 81 mg daily which was recently started by cardiology.  Patient denies headaches, fever, cough, shortness of breath, chest pain, nausea, vomiting, abdominal pain, diarrhea, or dysuria.  Brief/Interim Summary: This is a 88 year old female with history of persistent atrial fibrillation but not on any anticoagulation, recently started on aspirin was admitted to the hospital due to persistent bleeding from scalp hematoma and acute blood loss anemia.  Patient's bleeding had stopped in the ED, a total of 8 staples placed by ED  physician, placement of combat gauze, and pressure bandage applied to scalp.  Patient was seen by general surgery/Dr. Bobbye Morton, they performed a punch biopsy, pathology results are still pending.  Patient's hemoglobin dropped to 7.2 yesterday but improved  to 8.1 today without any blood transfusion.  Per patient's daughter request, wound care was consulted and they have placed the recommendations for daily dressing and they have recommended that patient should follow-up with wound care which is already scheduled for her on 01/01/2022 and if needed, she can also follow-up with plastic surgery for possible skin graft. Everything has been arranged for her, home health will be arranged for the patient at assisted living facility as well however the daughter at the moment is very hesitant to take her home despite of all the arrangements made.  The daughter wants to be 100% sure that the assisted living facility will be able to accommodate patient's needs.  The nurse is going to work with the Education officer, museum at the moment to find that out.  If everything will be sorted out, patient will be discharged to assisted living facility today.  She was taking doxycycline previously which I will resume as prophylaxis.  Although there are no visible signs of infection.  Discharge plan was discussed with patient and/or family member and they verbalized understanding and agreed with it.  Discharge Diagnoses:  Principal Problem:   Scalp hematoma Active Problems:   A-fib [facility])   Essential hypertension   Hypothyroidism   Constipation   ABLA (acute blood loss anemia)    Discharge Instructions   Allergies as of 12/22/2021       Reactions   Claritin [loratadine] Other (See Comments)   Listed on MAR Unknown reaction   Codeine Other (See Comments)   "just don't take it well"   Gabapentin Other (See Comments)   Dizziness    Molds & Smuts    dust   Pollen Extract Swelling   Grass   Bee Venom Swelling, Rash   Wasp Venom Swelling, Rash        Medication List     STOP taking these medications    amoxicillin 500 MG capsule Commonly known as: AMOXIL   doxycycline 100 MG tablet Commonly known as: VIBRA-TABS       TAKE these medications    aspirin EC 81  MG tablet Take 1 tablet (81 mg total) by mouth daily. Swallow whole.   atorvastatin 20 MG tablet Commonly known as: LIPITOR TAKE 1 TABLET DAILY   cyanocobalamin 1000 MCG tablet Commonly known as: VITAMIN B12 Take 1,000 mcg by mouth in the morning.   digoxin 0.125 MG tablet Commonly known as: LANOXIN TAKE 1 TABLET DAILY   ferrous sulfate 325 (65 FE) MG tablet Take 325 mg by mouth 2 (two) times daily.   Gemtesa 75 MG Tabs Generic drug: Vibegron Take 32.5 mg by mouth at bedtime.   hydrocortisone cream 1 % Apply 1 Application topically in the morning. To both palms   leflunomide 20 MG tablet Commonly known as: ARAVA Take 20 mg by mouth in the morning.   levothyroxine 88 MCG tablet Commonly known as: SYNTHROID TAKE 1 TABLET DAILY 30 MINUTES BEFORE BREAKFAST ON AN EMPTY STOMACHTAKE 1 TABLET DAILY 30 MINUTES BEFORE BREAKFAST ON AN EMPTY STOMACH What changed:  how much to take how to take this when to take this additional instructions   metoprolol succinate 50 MG 24 hr tablet Commonly known as: TOPROL-XL TAKE 1 TABLET DAILY   mirtazapine 15 MG  tablet Commonly known as: REMERON TAKE 1 TABLET AT BEDTIME   multivitamin tablet Take 1 tablet by mouth in the morning.   omeprazole 40 MG capsule Commonly known as: PRILOSEC TAKE 1 CAPSULE DAILY   PreserVision AREDS 2 Caps Take 1 capsule by mouth 2 (two) times daily.   sodium chloride 1 g tablet Take 1 g by mouth in the morning.   valsartan 80 MG tablet Commonly known as: DIOVAN Take 0.5 tablets (40 mg total) by mouth daily.   Vitamin D 50 MCG (2000 UT) Caps Take 2,000 Units by mouth in the morning.        Follow-up Information     Virgie Dad, MD Follow up in 1 week(s).   Specialty: Internal Medicine Contact information: [place] [address] [phone]         Pixie Casino, MD .   Specialty: Cardiology Contact information: [address] [place]  [address] [phone]         [facility] Follow up in 1 week(s).   Specialty: Plastic Surgery Contact information: [address] [place] [phone]               Allergies  Allergen Reactions   Claritin [Loratadine] Other (See Comments)    Listed on MAR Unknown reaction   Codeine Other (See Comments)    "just don't take it well"   Gabapentin Other (See Comments)    Dizziness    Molds & Smuts     dust   Pollen Extract Swelling    Grass   Bee Venom Swelling and Rash   Wasp Venom Swelling and Rash    Consultations: General surgery   Procedures/Studies: CT Head Wo Contrast  Result Date: 12/06/2021 CLINICAL DATA:  Bleeding wound EXAM: CT HEAD WITHOUT CONTRAST TECHNIQUE: Contiguous axial images were obtained from the base of the skull through the vertex without intravenous contrast. RADIATION DOSE REDUCTION: This exam was performed according to the departmental dose-optimization program which includes automated exposure control, adjustment of the mA and/or kV according to patient size and/or use of iterative reconstruction technique. COMPARISON:  10/27/2021 FINDINGS: Brain: No evidence of acute infarction, hemorrhage, hydrocephalus, extra-axial collection or mass lesion/mass effect. Subcortical white matter and periventricular small vessel ischemic changes. Vascular: Intracranial atherosclerosis. Skull: Normal. Negative for fracture or focal lesion. Sinuses/Orbits: The visualized paranasal sinuses are essentially clear. The mastoid air cells are unopacified. Other: Small extracranial hematoma overlying the right parietal bone (series 3/image 32). IMPRESSION: Small extracranial hematoma overlying the right parietal bone. No evidence of calvarial fracture. No acute intracranial abnormality. Small vessel ischemic changes. Electronically Signed   By: Julian Hy M.D.   On: 12/06/2021 02:01     Discharge Exam: Vitals:    12/22/21 0808 12/22/21 1145  BP: (!) 143/82 107/66  Pulse: 74 86  Resp: 18 16  Temp: (!) 97.4 F (36.3 C) 97.9 F (36.6 C)  SpO2: 100% 100%   Vitals:   12/22/21 0010 12/22/21 0407 12/22/21 0808 12/22/21 1145  BP: 127/69 97/61 (!) 143/82 107/66  Pulse: 80 66 74 86  Resp: '17 16 18 16  '$ Temp: 98.2 F (36.8 C) 97.7 F (36.5 C) (!) 97.4 F (36.3 C) 97.9 F (36.6 C)  TempSrc: Oral Oral Oral Oral  SpO2: 95% 100% 100% 100%  Weight:      Height:        General: Pt is alert, awake, not in  acute distress Cardiovascular: RRR, S1/S2 +, no rubs, no gallops Respiratory: CTA bilaterally, no wheezing, no rhonchi Abdominal: Soft, NT, ND, bowel sounds + Extremities: no edema, no cyanosis    The results of significant diagnostics from this hospitalization (including imaging, microbiology, ancillary and laboratory) are listed below for reference.     Microbiology: No results found for this or any previous visit (from the past 240 hour(s)).   Labs: BNP (last 3 results) No results for input(s): "BNP" in the last 8760 hours. Basic Metabolic Panel: Recent Labs  Lab 12/20/21 2238 12/22/21 0924  NA 133* 134*  K 4.4 4.4  CL 99 104  CO2 24 22  GLUCOSE 106* 91  BUN 16 14  CREATININE 0.78 0.85  CALCIUM 9.3 8.9   Liver Function Tests: No results for input(s): "AST", "ALT", "ALKPHOS", "BILITOT", "PROT", "ALBUMIN" in the last 168 hours. No results for input(s): "LIPASE", "AMYLASE" in the last 168 hours. No results for input(s): "AMMONIA" in the last 168 hours. CBC: Recent Labs  Lab 12/20/21 2238 12/21/21 0146 12/21/21 0318 12/21/21 1028 12/21/21 1654 12/22/21 0924  WBC 9.3 9.9 11.7*  --   --  8.6  HGB 9.7* 8.0* 7.4* 7.2* 7.1* 8.1*  HCT 29.5* 23.9* 23.2* 23.2* 22.3* 24.8*  MCV 106.5* 106.7* 109.4*  --   --  108.8*  PLT 231 237 216  --   --  206   Cardiac Enzymes: No results for input(s): "CKTOTAL", "CKMB", "CKMBINDEX", "TROPONINI" in the last 168 hours. BNP: Invalid  input(s): "POCBNP" CBG: No results for input(s): "GLUCAP" in the last 168 hours. D-Dimer No results for input(s): "DDIMER" in the last 72 hours. Hgb A1c No results for input(s): "HGBA1C" in the last 72 hours. Lipid Profile No results for input(s): "CHOL", "HDL", "LDLCALC", "TRIG", "CHOLHDL", "LDLDIRECT" in the last 72 hours. Thyroid function studies No results for input(s): "TSH", "T4TOTAL", "T3FREE", "THYROIDAB" in the last 72 hours.  Invalid input(s): "FREET3" Anemia work up No results for input(s): "VITAMINB12", "FOLATE", "FERRITIN", "TIBC", "IRON", "RETICCTPCT" in the last 72 hours. Urinalysis    Component Value Date/Time   COLORURINE YELLOW 10/27/2021 1320   APPEARANCEUR CLEAR 10/27/2021 1320   LABSPEC 1.005 10/27/2021 1320   PHURINE 6.5 10/27/2021 1320   GLUCOSEU NEGATIVE 10/27/2021 1320   HGBUR NEGATIVE 10/27/2021 1320   BILIRUBINUR NEGATIVE 10/27/2021 1320   KETONESUR NEGATIVE 10/27/2021 1320   PROTEINUR NEGATIVE 10/27/2021 1320   NITRITE POSITIVE (A) 10/27/2021 1320   LEUKOCYTESUR LARGE (A) 10/27/2021 1320   Sepsis Labs Recent Labs  Lab 12/20/21 2238 12/21/21 0146 12/21/21 0318 12/22/21 0924  WBC 9.3 9.9 11.7* 8.6   Microbiology No results found for this or any previous visit (from the past 240 hour(s)).   Time coordinating discharge: Over 30 minutes  SIGNED:   Darliss Cheney, MD  [facility] 12/22/2021, 1:34 PM *Please note that this is a verbal dictation therefore any spelling or grammatical errors are due to the "[place] [employer]" system interpretation. If 7PM-7AM, please contact night-coverage [email or web address]

## 2021-12-25 LAB — SURGICAL PATHOLOGY

## 2021-12-26 DIAGNOSIS — M2042 Other hammer toe(s) (acquired), left foot: Secondary | ICD-10-CM | POA: Diagnosis not present

## 2021-12-26 DIAGNOSIS — L602 Onychogryphosis: Secondary | ICD-10-CM | POA: Diagnosis not present

## 2021-12-26 DIAGNOSIS — L84 Corns and callosities: Secondary | ICD-10-CM | POA: Diagnosis not present

## 2021-12-27 ENCOUNTER — Ambulatory Visit: Payer: Medicare Other | Admitting: Physician Assistant

## 2021-12-27 NOTE — Progress Notes (Signed)
NEUROLOGY FOLLOW UP OFFICE NOTE  Kathy Velez [id number]  Assessment/Plan:   Major vascular neurocognitive disorder, mild Atrial fibrillation History of TIAs and left frontal lobe infarct, likely cardioembolic Cerebral amyloid angiopathy Hypertension Hyperlipidemia  Due to history of recurrent falls, cerebral amyloid angiopathy and HasBled Score of 3, I would recommend to not resume anticoagulation.  Once her hematoma heals and is cleared, would instead start ASA '81mg'$  daily.  Patient and daughter do recognize increased risk of cardioembolic stroke, but I feel her high risk of hemorrhagic stroke outweighs benefits of anticoagulation.   1.  Will try another cholinesterase inhibitor:  rivastigmine 1.'5mg'$  twice daily for 4 weeks, then '3mg'$  twice daily 2.  Secondary stroke prevention as per PCP/cardiology: - When cleared, would recommend ASA '81mg'$  rather than anticoagulation. - Statin (LDL goal less than 70) - Normotensive blood pressure - follow up with PCP - Glycemic control (Hgb A1c goal less than 7) 3. Follow up 6 months with Sharene Butters, PA-C   Subjective:  Chirstine Velez is an 88 year old right-handed white female with paroxysmal atrial fibrillation on Eliquis, HTN, hypothyroidism, interstitial lung disease, rheumatoid arthritis and history of stroke and colon cancer who follows up for [facility].     UPDATE: Current medications:  Eliquis; digoxin; atorvastatin '20mg'$ ; Toprol XL; valsartan   She is in independent living in [place].  They have transportation available to take her shopping.  Her daughter sets up her pillbox for her.  Her balance is still an issue.  She had handle bars installed in the shower and at the toilet.  Using her walker in the apartment.  She notices slight worsening in short-term memory but not significant.  When she is out of her element, such as visiting with her family to their vacation house, she may be a little more confused.   He had been on Eliquis for  permanent a fib.  She has had several falls.  In July, she fell and developed a large scalp hematoma.  Last month, she developed perfuse arterial bleeding.  CT head personally reviewed demonstrated the small scalp hematoma overlying the right parietal bone but no fracture or intracranial abnormality.  She underwent acute intervention complicated by blood loss requiring transfusion.  Eliquis was placed on hold.     HISTORY: On 04/20/2019, she presented to the [facility] ED after having word-finding difficulties and making paraphasic errors lasting a couple of hours.  She was slurring her words and wasn't able to answer basic questions.  No associated facial droop, numbness or weakness.  She has some baseline diplopia but without change.  She had some cough but no fever or shortness of breath.  Labs were unremarkable, including CBC, CMP, TSH (2.309) and testing for flu and Covid.  CXR showed no active pulmonary disease.  EKG showed known atrial fibrillation but no acute ischemic cardiac event.  CT head without contrast showed chronic small vessel disease but no acute findings.  MRI of brain without contrast showed 2 punctate subacute infarcts in the left corona radiata and occipital pole subcortical white matter. Otherwise chronic small vessel ischemic changes with few scattered chronic microhemorrhages in the left occipital pole and central pons and small remote lacunar infarcts in right cerebellum.  No change was made to antiplatelet and anticoagulation therapy.  She is on ASA and Eliquis.  Several months prior, she had a severe left sided headache and noted vision loss on the left side of her vision while watching TV.  It lasted  2 hours.   She had another stroke in July 2021, while in the [facility], in which she had confusion, language difficulty and transient right facial droop.  She was transferred to [facility] on 09/27/2019.  MRI of brain showed an acute infarct in the left  frontal corona radiata.  Again demonstrated multiple areas of remote microhemorrhages in the peripheral cerebral hemispheres as well as pons, suggesting hypertensive hemorrhage with underlying cerebral amyloid angiopathy.  CTA head and neck showed no large vessel intracranial occlusion or significant stenosis but did demonstrate atherosclerotic plaque in both ICAs causing less than 50% stenosis on the right and 50% stenosis on the left.  Echocardiogram was negative for thrombus.  LDL 46.  HGB A1c 5.1.  Telemetry demonstrated atrial fibrillation.  Due to possible cerebral amyloid angiopathy, ASA was discontinued.  Since her stroke in July, she has had ongoing short-term memory problems.   On 03/28/2020, she developed sudden onset of right sided numbness and tingling involving her face, tongue and hand lasting about 20 minutes and resolved.  No associated weakness.  The next day, she was was seen by the nurse at [facility] who advised her to go the ED for evaluation.  CT head personally reviewed showed no acute intracranial abnormality.  Due to recent work up previously performed and mild sensory symptoms that have since resolved, she was discharged in stable condition with no change in management   She reports prior history of TIA with right sided weakness.   Previous studies: 11/27/2018 ECHOCARDIOGRAM:  EF 60-65% with no cardiac source of emboli 02/03/2019 LABS:  LDL 58; B12 1,559 03/06/2019 CAROTID DOPPLER:  Moderate heterogeneous and calcified plaque with no hemodynamically significant stenosis   Past medication:  donepezil (leg cramps), rivastigmine  PAST MEDICAL HISTORY: Past Medical History:  Diagnosis Date   Allergy    seasonal   Anemia    Anxiety    Arthritis    Back   Atrial fibrillation ([place])    Cancer ([place]) 2006   Colon.  Basal Cell Skin cancer- right arm   Cataract    removed bilateral   Colon cancer ([facility])    Constipation due to pain medication therapy    after heart surgery    Dysrhythmia    PAF   GERD (gastroesophageal reflux disease)    Heart murmur    Hyperlipidemia    Hypertension    Hypothyroidism    RA (rheumatoid arthritis) ([place])    Restless leg    Seizures ([place])    after Heart Surgey   Stroke ([place])    TIA- found by neurologist after     MEDICATIONS: Current Outpatient Medications on File Prior to Visit  Medication Sig Dispense Refill   aspirin EC 81 MG tablet Take 1 tablet (81 mg total) by mouth daily. Swallow whole. 90 tablet 3   atorvastatin (LIPITOR) 20 MG tablet TAKE 1 TABLET DAILY 90 tablet 3   Cholecalciferol (VITAMIN D) 50 MCG (2000 UT) CAPS Take 2,000 Units by mouth in the morning.     digoxin (LANOXIN) 0.125 MG tablet TAKE 1 TABLET DAILY (Patient taking differently: Take 0.125 mg by mouth daily.) 90 tablet 3   ferrous sulfate 325 (65 FE) MG tablet Take 325 mg by mouth 2 (two) times daily.     hydrocortisone cream 1 % Apply 1 Application topically in the morning. To both palms     leflunomide (ARAVA) 20 MG tablet Take 20 mg by mouth in the morning.  levothyroxine (SYNTHROID) 88 MCG tablet TAKE 1 TABLET DAILY 30 MINUTES BEFORE BREAKFAST ON AN EMPTY STOMACHTAKE 1 TABLET DAILY 30 MINUTES BEFORE BREAKFAST ON AN EMPTY STOMACH (Patient taking differently: Take 88 mcg by mouth daily before breakfast. TAKE 1 TABLET DAILY 30 MINUTES BEFORE BREAKFAST ON AN EMPTY STOMACH) 30 tablet 0   metoprolol succinate (TOPROL-XL) 50 MG 24 hr tablet TAKE 1 TABLET DAILY (Patient taking differently: Take 50 mg by mouth daily.) 90 tablet 3   mirtazapine (REMERON) 15 MG tablet TAKE 1 TABLET AT BEDTIME (Patient taking differently: Take 15 mg by mouth at bedtime.) 90 tablet 3   Multiple Vitamin (MULTIVITAMIN) tablet Take 1 tablet by mouth in the morning.     Multiple Vitamins-Minerals (PRESERVISION AREDS 2) CAPS Take 1 capsule by mouth 2 (two) times daily.     omeprazole (PRILOSEC) 40 MG capsule TAKE 1 CAPSULE DAILY (Patient taking differently: Take 40 mg by mouth  daily.) 90 capsule 3   sodium chloride 1 g tablet Take 1 g by mouth in the morning.     valsartan (DIOVAN) 80 MG tablet Take 0.5 tablets (40 mg total) by mouth daily. 15 tablet 0   Vibegron (GEMTESA) 75 MG TABS Take 32.5 mg by mouth at bedtime. 30 tablet 5   vitamin B-12 (CYANOCOBALAMIN) 1000 MCG tablet Take 1,000 mcg by mouth in the morning.     No current facility-administered medications on file prior to visit.    ALLERGIES: Allergies  Allergen Reactions   Claritin [Loratadine] Other (See Comments)    Listed on MAR Unknown reaction   Codeine Other (See Comments)    "just don't take it well"   Gabapentin Other (See Comments)    Dizziness    Molds & Smuts     dust   Pollen Extract Swelling    Grass   Bee Venom Swelling and Rash   Wasp Venom Swelling and Rash    FAMILY HISTORY: Family History  Problem Relation Age of Onset   Lung disease Mother 27   Heart disease Father 69   Colon polyps Daughter    Alcohol abuse Son    Colon cancer Neg Hx    Stomach cancer Neg Hx    Rectal cancer Neg Hx    Crohn's disease Neg Hx    Esophageal cancer Neg Hx       Objective:  Blood pressure (!) 142/70, pulse 93, height '5\' 2"'$  (1.575 m), weight 125 lb 3.2 oz (56.8 kg), SpO2 99 %. General: No acute distress.  Patient appears well-groomed.   Head:  Hematoma in  Eyes:  Fundi examined but not visualized Neck: supple, no paraspinal tenderness, full range of motion Heart:  Regular rate and rhythm Lungs:  Clear to auscultation bilaterally Back: No paraspinal tenderness Neurological Exam:     03/01/2020    4:00 PM 03/10/2021    1:00 PM 01/01/2022   12:00 PM  [employer] Mental Exam  Weekday Correct '1 1 1  '$ Current year '1 1 1  '$ What state are we in? '1 1 1  '$ Amount spent '1 1 1  '$ Amount left '2 2 2  '$ # of Animals '2 2 1  5 '$ objects recall '2 2 2  '$ Number series '1 2 1  '$ Hour markers '2 2 2  '$ Time correct 0 2 2  Placed X in triangle correctly '1 1 1  '$ Largest Figure '1 1 1  '$ Name of female  '2 2 2  '$ Date back to work 2 0 0  Type  of work 2 0 2  State she lived in 0 0 2  Total score '21 20 22   '$ CN II-XII intact. Bulk and tone normal, muscle strength 5/5 throughout.  Sensation to light touch intact.  Deep tendon reflexes absent throughout, toes downgoing.  Finger to nose testing intact.  Broad-based cautious gait with assistance of walker.   Metta Clines, DO  CC: Veleta Miners, MD

## 2021-12-28 DIAGNOSIS — D500 Iron deficiency anemia secondary to blood loss (chronic): Secondary | ICD-10-CM | POA: Diagnosis not present

## 2021-12-29 DIAGNOSIS — E871 Hypo-osmolality and hyponatremia: Secondary | ICD-10-CM | POA: Diagnosis not present

## 2021-12-29 DIAGNOSIS — D509 Iron deficiency anemia, unspecified: Secondary | ICD-10-CM | POA: Diagnosis not present

## 2021-12-29 LAB — CBC AND DIFFERENTIAL
HCT: 27 — AB (ref 36–46)
Hemoglobin: 8.9 — AB (ref 12.0–16.0)
WBC: 6.3

## 2021-12-29 LAB — BASIC METABOLIC PANEL
BUN: 13 (ref 4–21)
CO2: 23 — AB (ref 13–22)
Chloride: 99 (ref 99–108)
Creatinine: 0.9 (ref 0.5–1.1)
Glucose: 90
Potassium: 4.5 mEq/L (ref 3.5–5.1)
Sodium: 135 — AB (ref 137–147)

## 2021-12-29 LAB — COMPREHENSIVE METABOLIC PANEL
Calcium: 9.1 (ref 8.7–10.7)
eGFR: 61

## 2021-12-29 LAB — CBC: RBC: 2.44 — AB (ref 3.87–5.11)

## 2022-01-01 ENCOUNTER — Ambulatory Visit: Payer: Medicare Other | Admitting: Physician Assistant

## 2022-01-01 ENCOUNTER — Encounter (HOSPITAL_BASED_OUTPATIENT_CLINIC_OR_DEPARTMENT_OTHER): Payer: Medicare Other | Attending: General Surgery | Admitting: General Surgery

## 2022-01-01 ENCOUNTER — Ambulatory Visit (INDEPENDENT_AMBULATORY_CARE_PROVIDER_SITE_OTHER): Payer: Medicare Other | Admitting: Neurology

## 2022-01-01 ENCOUNTER — Ambulatory Visit: Payer: Medicare Other | Admitting: Neurology

## 2022-01-01 ENCOUNTER — Encounter: Payer: Self-pay | Admitting: Neurology

## 2022-01-01 VITALS — BP 142/70 | HR 93 | Ht 62.0 in | Wt 125.2 lb

## 2022-01-01 DIAGNOSIS — W19XXXA Unspecified fall, initial encounter: Secondary | ICD-10-CM | POA: Insufficient documentation

## 2022-01-01 DIAGNOSIS — L98499 Non-pressure chronic ulcer of skin of other sites with unspecified severity: Secondary | ICD-10-CM | POA: Diagnosis not present

## 2022-01-01 DIAGNOSIS — F0150 Vascular dementia without behavioral disturbance: Secondary | ICD-10-CM | POA: Diagnosis not present

## 2022-01-01 DIAGNOSIS — F01A0 Vascular dementia, mild, without behavioral disturbance, psychotic disturbance, mood disturbance, and anxiety: Secondary | ICD-10-CM | POA: Insufficient documentation

## 2022-01-01 DIAGNOSIS — I4821 Permanent atrial fibrillation: Secondary | ICD-10-CM

## 2022-01-01 DIAGNOSIS — S0180XA Unspecified open wound of other part of head, initial encounter: Secondary | ICD-10-CM | POA: Diagnosis not present

## 2022-01-01 DIAGNOSIS — I4891 Unspecified atrial fibrillation: Secondary | ICD-10-CM | POA: Diagnosis not present

## 2022-01-01 DIAGNOSIS — Z7901 Long term (current) use of anticoagulants: Secondary | ICD-10-CM | POA: Diagnosis not present

## 2022-01-01 DIAGNOSIS — J849 Interstitial pulmonary disease, unspecified: Secondary | ICD-10-CM | POA: Insufficient documentation

## 2022-01-01 DIAGNOSIS — J984 Other disorders of lung: Secondary | ICD-10-CM | POA: Insufficient documentation

## 2022-01-01 DIAGNOSIS — Z85038 Personal history of other malignant neoplasm of large intestine: Secondary | ICD-10-CM | POA: Insufficient documentation

## 2022-01-01 DIAGNOSIS — I680 Cerebral amyloid angiopathy: Secondary | ICD-10-CM | POA: Diagnosis not present

## 2022-01-01 DIAGNOSIS — S0003XA Contusion of scalp, initial encounter: Secondary | ICD-10-CM | POA: Diagnosis not present

## 2022-01-01 NOTE — Progress Notes (Signed)
Kathy, Velez ([id number]) [id number] Page 1 of 9 Visit Report for 01/01/2022 Allergy List Details Patient Name: Date of Service: EA TO Kathy Velez 01/01/2022 8:00 A M Medical Record Number: [id number] Patient Account Number: [id number] Date of Birth/Sex: Treating RN: 1931-08-10 (88 y.o. Kathy Velez Primary Care Shareen Capwell: Veleta Miners Other Clinician: Referring Adrik Khim: Treating Caydance Kuehnle/Extender: Jaci Lazier in Treatment: 0 Allergies Active Allergies codeine Severity: Severe bee venom protein (honey bee) Severity: Severe pollen extracts Severity: Severe molds and smuts Type: Allergen venom-wasp Severity: Severe gabapentin Severity: Moderate Claritin Severity: Moderate Allergy Notes Electronic Signature(s) Signed: 01/01/2022 5:11:32 PM By: Dellie Catholic RN Entered By: Dellie Catholic on 01/01/2022 08:25:36 -------------------------------------------------------------------------------- Arrival Information Details Patient Name: Date of Service: EA TO N, A Kathy 01/01/2022 8:00 A M Medical Record Number: [id number] Patient Account Number: [id number] Date of Birth/Sex: Treating RN: 07-28-1931 (88 y.o. Kathy Velez Primary Care Fotini Lemus: Veleta Miners Other Clinician: Referring Sherard Sutch: Treating Charlesetta Milliron/Extender: Jaci Lazier in Treatment: 0 Visit Information Patient Arrived: Kathy Velez Time: 08:09 Accompanied By: daughter Transfer Assistance: None Patient Identification VerifiedROVENA, Velez ([id number]) [id number] Page 2 of 9 Electronic Signature(s) Signed: 01/01/2022 5:11:32 PM By: Dellie Catholic RN Entered By: Dellie Catholic on 01/01/2022 08:19:18 -------------------------------------------------------------------------------- Clinic Level of Care Assessment Details Patient Name: Date of Service: EA TO Kathy Velez 01/01/2022 8:00 A M Medical  Record Number: [id number] Patient Account Number: [id number] Date of Birth/Sex: Treating RN: April 18, 1931 (88 y.o. Kathy Velez Primary Care Davius Goudeau: Veleta Miners Other Clinician: Referring Airiana Elman: Treating Kathy Velez/Extender: Jaci Lazier in Treatment: 0 Clinic Level of Care Assessment Items TOOL 4 Quantity Score X- 1 0 Use when only an EandM is performed on FOLLOW-UP visit ASSESSMENTS - Nursing Assessment / Reassessment X- 1 10 Reassessment of Co-morbidities (includes updates in patient status) X- 1 5 Reassessment of Adherence to Treatment Plan ASSESSMENTS - Wound and Skin A ssessment / Reassessment X - Simple Wound Assessment / Reassessment - one wound 1 5 '[]'$  - 0 Complex Wound Assessment / Reassessment - multiple wounds '[]'$  - 0 Dermatologic / Skin Assessment (not related to wound area) ASSESSMENTS - Focused Assessment '[]'$  - 0 Circumferential Edema Measurements - multi extremities '[]'$  - 0 Nutritional Assessment / Counseling / Intervention '[]'$  - 0 Lower Extremity Assessment (monofilament, tuning fork, pulses) '[]'$  - 0 Peripheral Arterial Disease Assessment (using hand held doppler) ASSESSMENTS - Ostomy and/or Continence Assessment and Care '[]'$  - 0 Incontinence Assessment and Management '[]'$  - 0 Ostomy Care Assessment and Management (repouching, etc.) PROCESS - Coordination of Care X - Simple Patient / Family Education for ongoing care 1 15 '[]'$  - 0 Complex (extensive) Patient / Family Education for ongoing care X- 1 10 Staff obtains Programmer, systems, Records, T Results / Process Orders est X- 1 10 Staff telephones HHA, Nursing Homes / Clarify orders / etc '[]'$  - 0 Routine Transfer to another Facility (non-emergent condition) '[]'$  - 0 Routine Hospital Admission (non-emergent condition) '[]'$  - 0 New Admissions / Biomedical engineer / Ordering NPWT Apligraf, etc. , X- 1 20 Emergency Hospital Admission (emergent condition) X- 1 10 Simple Discharge  Coordination '[]'$  - 0 Complex (extensive) Discharge Coordination PROCESS - Special Needs '[]'$  - 0 Pediatric / Minor Patient Management '[]'$  - 0 Isolation Patient Management '[]'$  - 0 Hearing / Language / Visual special needs Kathy, Rodena Velez ([id number]) [phone].pdf Page 3 of 9 '[]'$  - 0 Assessment of Community assistance (transportation, D/C planning, etc.) '[]'$  - 0 Additional assistance / Altered  mentation '[]'$  - 0 Support Surface(s) Assessment (bed, cushion, seat, etc.) INTERVENTIONS - Wound Cleansing / Measurement X - Simple Wound Cleansing - one wound 1 5 '[]'$  - 0 Complex Wound Cleansing - multiple wounds X- 1 5 Wound Imaging (photographs - any number of wounds) '[]'$  - 0 Wound Tracing (instead of photographs) X- 1 5 Simple Wound Measurement - one wound '[]'$  - 0 Complex Wound Measurement - multiple wounds INTERVENTIONS - Wound Dressings X - Small Wound Dressing one or multiple wounds 1 10 '[]'$  - 0 Medium Wound Dressing one or multiple wounds '[]'$  - 0 Large Wound Dressing one or multiple wounds '[]'$  - 0 Application of Medications - topical '[]'$  - 0 Application of Medications - injection INTERVENTIONS - Miscellaneous '[]'$  - 0 External ear exam '[]'$  - 0 Specimen Collection (cultures, biopsies, blood, body fluids, etc.) '[]'$  - 0 Specimen(s) / Culture(s) sent or taken to Lab for analysis '[]'$  - 0 Patient Transfer (multiple staff / Civil Service fast streamer / Similar devices) '[]'$  - 0 Simple Staple / Suture removal (25 or less) '[]'$  - 0 Complex Staple / Suture removal (26 or more) '[]'$  - 0 Hypo / Hyperglycemic Management (close monitor of Blood Glucose) '[]'$  - 0 Ankle / Brachial Index (ABI) - do not check if billed separately X- 1 5 Vital Signs Has the patient been seen at the hospital within the last three years: Yes Total Score: 115 Level Of Care: New/Established - Level 3 Electronic Signature(s) Signed: 01/01/2022 5:11:32 PM By: Dellie Catholic RN Entered By: Dellie Catholic on 01/01/2022  09:21:09 -------------------------------------------------------------------------------- Encounter Discharge Information Details Patient Name: Date of Service: EA TO N, A Kathy 01/01/2022 8:00 A M Medical Record Number: [id number] Patient Account Number: [id number] Date of Birth/Sex: Treating RN: [birth year] (88 y.o. Kathy Velez Primary Care Greidys Deland: Veleta Miners Other Clinician: Referring Trevon Strothers: Treating Maeby Vankleeck/Extender: Jaci Lazier in Treatment: 0 Encounter Discharge Information Items Discharge Condition: Stable Ambulatory Status: Walker Discharge Destination: Other (Note Required) Telephoned: No Orders SentTAQUISHA, PHUNG ([id number]) [phone].pdf Page 4 of 9 Transportation: Private Auto Accompanied By: daughter Schedule Follow-up Appointment: Yes Clinical Summary of Care: Patient Declined Notes Pt. lives at [place] Signature(s) Signed: 01/01/2022 5:11:32 PM By: Dellie Catholic RN Entered By: Dellie Catholic on 01/01/2022 09:22:12 -------------------------------------------------------------------------------- Lower Extremity Assessment Details Patient Name: Date of Service: EA TO N, A Kathy 01/01/2022 8:00 A M Medical Record Number: [id number] Patient Account Number: [id number] Date of Birth/Sex: Treating RN: 1931/09/13 (88 y.o. Kathy Velez Primary Care Derrick Orris: Veleta Miners Other Clinician: Referring Mckenleigh Tarlton: Treating Mays Paino/Extender: Jari Sportsman Weeks in Treatment: 0 Electronic Signature(s) Signed: 01/01/2022 5:11:32 PM By: Dellie Catholic RN Entered By: Dellie Catholic on 01/01/2022 08:37:47 -------------------------------------------------------------------------------- Multi Wound Chart Details Patient Name: Date of Service: EA TO N, A Kathy 01/01/2022 8:00 A M Medical Record Number: [id number] Patient Account Number: [id number] Date of Birth/Sex: Treating  RN: Oct 16, 1931 (88 y.o. F) Primary Care Deanza Upperman: Veleta Miners Other Clinician: Referring Veronica Guerrant: Treating Adeola Dennen/Extender: Jaci Lazier in Treatment: 0 Vital Signs Height(in): 62 Pulse(bpm): 80 Weight(lbs): 120 Blood Pressure(mmHg): 151/82 Body Mass Index(BMI): 21.9 Temperature(F): 98.6 Respiratory Rate(breaths/min): 16 [1:Photos:] [N/A:N/A] Right Head - Parietal N/A N/A Wound Location: Hematoma N/A N/A Wounding Event: Trauma, Other N/A N/A Primary Etiology: Cataracts, Arrhythmia, Hypertension, N/A N/A Comorbid History: Seizure Disorder [place], Rodena Velez ([id number]) [id number] Page 5 of 9 10/22/2021 N/A N/A Date Acquired: 0 N/A N/A Weeks of Treatment: Open N/A N/A Wound Status: No N/A N/A Wound Recurrence: 2.6x2.2x0.1  N/A N/A Measurements L x W x D (cm) 4.492 N/A N/A A (cm) : rea 0.449 N/A N/A Volume (cm) : Full Thickness Without Exposed N/A N/A Classification: Support Structures Medium N/A N/A Exudate A mount: Serosanguineous N/A N/A Exudate Type: red, Velez N/A N/A Exudate Color: Small (1-33%) N/A N/A Granulation Amount: Red N/A N/A Granulation Quality: Large (67-100%) N/A N/A Necrotic Amount: Eschar N/A N/A Necrotic Tissue: Fat Layer (Subcutaneous Tissue): Yes N/A N/A Exposed Structures: Fascia: No Tendon: No Muscle: No Joint: No Bone: No None N/A N/A Epithelialization: Excoriation: No N/A N/A Periwound Skin Texture: Induration: No Callus: No Crepitus: No Rash: No Scarring: No Maceration: No N/A N/A Periwound Skin Moisture: Dry/Scaly: No Atrophie Blanche: No N/A N/A Periwound Skin Color: Cyanosis: No Ecchymosis: No Erythema: No Hemosiderin Staining: No Mottled: No Pallor: No Rubor: No Treatment Notes Wound #1 (Head - Parietal) Wound Laterality: Right Cleanser Soap and Water Discharge Instruction: May shower and wash wound with dial antibacterial soap and water prior to  dressing change. Wound Cleanser Discharge Instruction: Cleanse the wound with wound cleanser prior to applying a clean dressing using gauze sponges, not tissue or cotton balls. Peri-Wound Care Topical Primary Dressing MediHoney Gel, tube 1.5 (oz) Discharge Instruction: Continue to apply to wound bed as instructed Secondary Dressing Bordered Gauze, 2x3.75 in Discharge Instruction: Apply over primary dressing as directed. Secured With Compression Wrap Compression Stockings Environmental education officer) Signed: 01/01/2022 9:28:49 AM By: Fredirick Maudlin MD FACS Entered By: Fredirick Maudlin on 01/01/2022 82:70:78 [name] ([id number]) [id number] Page 6 of 9 -------------------------------------------------------------------------------- Multi-Disciplinary Care Plan Details Patient Name: Date of Service: EA TO Kathy Velez 01/01/2022 8:00 A M Medical Record Number: [id number] Patient Account Number: [id number] Date of Birth/Sex: Treating RN: Sep 01, 1931 (88 y.o. Kathy Velez Primary Care Adian Jablonowski: Veleta Miners Other Clinician: Referring Jobany Montellano: Treating Shray Hunley/Extender: Jaci Lazier in Treatment: 0 Active Inactive Wound/Skin Impairment Nursing Diagnoses: Knowledge deficit related to ulceration/compromised skin integrity Goals: Patient/caregiver will verbalize understanding of skin care regimen Date Initiated: 01/01/2022 Target Resolution Date: 03/25/2022 Goal Status: Active Interventions: Assess ulceration(s) every visit Treatment Activities: Skin care regimen initiated : 01/01/2022 Notes: Electronic Signature(s) Signed: 01/01/2022 5:11:32 PM By: Dellie Catholic RN Entered By: Dellie Catholic on 01/01/2022 08:53:17 -------------------------------------------------------------------------------- Pain Assessment Details Patient Name: Date of Service: EA TO N, A Kathy 01/01/2022 8:00 A M Medical Record Number:  [id number] Patient Account Number: [id number] Date of Birth/Sex: Treating RN: 23-Aug-1931 (88 y.o. Kathy Velez Primary Care Yassin Scales: Veleta Miners Other Clinician: Referring Sidnie Swalley: Treating Nyssa Sayegh/Extender: Jaci Lazier in Treatment: 0 Active Problems Location of Pain Severity and Description of Pain Patient Has Paino No Site Locations [place], [place] ([id number]) [id number] Page 7 of 9 Pain Management and Medication Current Pain Management: Electronic Signature(s) Signed: 01/01/2022 5:11:32 PM By: Dellie Catholic RN Entered By: Dellie Catholic on 01/01/2022 08:46:10 -------------------------------------------------------------------------------- Patient/Caregiver Education Details Patient Name: Date of Service: EA TO Durward Parcel Kathy 10/9/2023andnbsp8:00 A M Medical Record Number: [id number] Patient Account Number: [id number] Date of Birth/Gender: Treating RN: 1931/12/13 (88 y.o. Kathy Velez Primary Care Physician: Veleta Miners Other Clinician: Referring Physician: Treating Physician/Extender: Jaci Lazier in Treatment: 0 Education Assessment Education Provided To: Patient Education Topics Provided Wound/Skin Impairment: Methods: Explain/Verbal Responses: Return demonstration correctly Electronic Signature(s) Signed: 01/01/2022 5:11:32 PM By: Dellie Catholic RN Entered By: Dellie Catholic on 01/01/2022 08:53:35 -------------------------------------------------------------------------------- Wound Assessment Details Patient Name: Date of Service: EA TO N, A Kathy 01/01/2022 8:00 A M Medical Record Number:  [id number] Patient Account Number: [id number] Date of Birth/Sex: Treating RN: May 01, 1931 (88 y.o. Kathy Velez Primary Care Aslyn Cottman: Veleta Miners Other Clinician: Referring Elihue Ebert: Treating Caly Pellum/Extender: Jari Sportsman [place]ijer[place], Rodena Velez ([id number])  [id number] Page 8 of 9 Weeks in Treatment: 0 Wound Status Wound Number: 1 Primary Etiology: Trauma, Other Wound Location: Right Head - Parietal Wound Status: Open Wounding Event: Hematoma Comorbid History: Cataracts, Arrhythmia, Hypertension, Seizure Disorder Date Acquired: 10/22/2021 Weeks Of Treatment: 0 Clustered Wound: No Photos Wound Measurements Length: (cm) 2.6 Width: (cm) 2.2 Depth: (cm) 0.1 Area: (cm) 4.492 Volume: (cm) 0.449 % Reduction in Area: % Reduction in Volume: Epithelialization: None Tunneling: No Undermining: No Wound Description Classification: Full Thickness Without Exposed S Exudate Amount: Medium Exudate Type: Serosanguineous Exudate Color: red, Velez upport Structures Wound Bed Granulation Amount: Small (1-33%) Exposed Structure Granulation Quality: Red Fascia Exposed: No Necrotic Amount: Large (67-100%) Fat Layer (Subcutaneous Tissue) Exposed: Yes Necrotic Quality: Eschar Tendon Exposed: No Muscle Exposed: No Joint Exposed: No Bone Exposed: No Periwound Skin Texture Texture Color No Abnormalities Noted: Yes No Abnormalities Noted: Yes Moisture No Abnormalities Noted: No Dry / Scaly: No Maceration: No Treatment Notes Wound #1 (Head - Parietal) Wound Laterality: Right Cleanser Soap and Water Discharge Instruction: May shower and wash wound with dial antibacterial soap and water prior to dressing change. Wound Cleanser Discharge Instruction: Cleanse the wound with wound cleanser prior to applying a clean dressing using gauze sponges, not tissue or cotton balls. Peri-Wound Care Topical Primary Dressing MediHoney Gel, tube 1.5 (oz) Discharge Instruction: Continue to apply to wound bed as instructed Secondary Dressing [name] ([id number]) [phone] Page 9 of 9 Bordered Gauze, 2x3.75 in Discharge Instruction: Apply over primary dressing as directed. Secured With Compression  Wrap Compression Stockings Environmental education officer) Signed: 01/01/2022 5:11:32 PM By: Dellie Catholic RN Entered By: Dellie Catholic on 01/01/2022 08:44:12 -------------------------------------------------------------------------------- Vitals Details Patient Name: Date of Service: EA TO N, A Kathy 01/01/2022 8:00 A M Medical Record Number: [id number] Patient Account Number: [id number] Date of Birth/Sex: Treating RN: 09/07/1931 (88 y.o. Kathy Velez Primary Care Jaymar Loeber: Veleta Miners Other Clinician: Referring Avina Eberle: Treating Wheeler Incorvaia/Extender: Jaci Lazier in Treatment: 0 Vital Signs Time Taken: 08:10 Temperature (F): 98.6 Height (in): 62 Pulse (bpm): 80 Source: Stated Respiratory Rate (breaths/min): 16 Weight (lbs): 120 Blood Pressure (mmHg): 151/82 Source: Stated Reference Range: 80 - 120 mg / dl Body Mass Index (BMI): 21.9 Electronic Signature(s) Signed: 01/01/2022 5:11:32 PM By: Dellie Catholic RN Entered By: Dellie Catholic on 01/01/2022 08:22:39

## 2022-01-01 NOTE — Patient Instructions (Addendum)
After the hematoma heals, would start aspirin '81mg'$  daily rather than Eliquis Follow up in 6 months with Clarise Cruz

## 2022-01-01 NOTE — Progress Notes (Signed)
SERINITY, WARE ([id number]) [id number] Page 1 of 4 Visit Report for 01/01/2022 Abuse Risk Screen Details Patient Name: Date of Service: EA TO Kathy Velez 01/01/2022 8:00 A M Medical Record Number: [id number] Patient Account Number: [id number] Date of Birth/Sex: Treating RN: [birth year] (88 y.o. Kathy Velez Primary Care Barlow Harrison: Veleta Miners Other Clinician: Referring Mavi Un: Treating Rexford Prevo/Extender: Jaci Lazier in Treatment: 0 Abuse Risk Screen Items Answer ABUSE RISK SCREEN: Has anyone close to you tried to hurt or harm you recentlyo No Do you feel uncomfortable with anyone in your familyo No Has anyone forced you do things that you didnt want to doo No Electronic Signature(s) Signed: 01/01/2022 5:11:32 PM By: Dellie Catholic RN Entered By: Dellie Catholic on 01/01/2022 08:33:57 -------------------------------------------------------------------------------- Activities of Daily Living Details Patient Name: Date of Service: EA TO Kathy Velez 01/01/2022 8:00 [place] Record Number: [id number] Patient Account Number: [id number] Date of Birth/Sex: Treating RN: 27-Jul-1931 (88 y.o. Kathy Velez Primary Care Bedford Winsor: Veleta Miners Other Clinician: Referring Toccara Alford: Treating Axel Meas/Extender: Jaci Lazier in Treatment: 0 Activities of Daily Living Items Answer Activities of Daily Living (Please select one for each item) Drive Automobile Not Able T Medications ake Completely Able Use T elephone Completely Able Care for Appearance Completely Able Use T oilet Need Assistance Bath / Shower Need Assistance Dress Self Need Assistance Feed Self Completely Able Walk Need Assistance Get In / Out Bed Need Assistance Housework Not Able Prepare Meals Not Able Handle Money Need Assistance Shop for Self Need Assistance Electronic Signature(s) Signed: 01/01/2022 5:11:32 PM By: Dellie Catholic  RN Entered By: Dellie Catholic on 01/01/2022 08:34:29 Kathy Velez ([id number]) [id number] Page 2 of 4 -------------------------------------------------------------------------------- Education Screening Details Patient Name: Date of Service: EA TO Kathy Velez 01/01/2022 8:00 A M Medical Record Number: [id number] Patient Account Number: [id number] Date of Birth/Sex: Treating RN: 1931/05/30 (88 y.o. Kathy Velez Primary Care Renu Asby: Veleta Miners Other Clinician: Referring Ayriana Wix: Treating Jenniefer Salak/Extender: Jaci Lazier in Treatment: 0 Primary Learner Assessed: Patient Learning Preferences/Education Level/Primary Language Highest Education Level: College or Above Preferred Language: English Cognitive Barrier Language Barrier: No Translator Needed: No Memory Deficit: No Emotional Barrier: No Cultural/Religious Beliefs Affecting Medical Care: No Physical Barrier Impaired Vision: No Impaired Hearing: Yes Hearing Aid, bilateral ears Decreased Hand dexterity: No Knowledge/Comprehension Knowledge Level: High Comprehension Level: High Ability to understand written instructions: High Ability to understand verbal instructions: High Motivation Anxiety Level: Calm Cooperation: Cooperative Education Importance: Acknowledges Need Interest in Health Problems: Asks Questions Perception: Coherent Willingness to Engage in Self-Management High Activities: Readiness to Engage in Self-Management High Activities: Electronic Signature(s) Signed: 01/01/2022 5:11:32 PM By: Dellie Catholic RN Entered By: Dellie Catholic on 01/01/2022 08:36:16 -------------------------------------------------------------------------------- Fall Risk Assessment Details Patient Name: Date of Service: EA TO Kathy Velez 01/01/2022 8:00 A M Medical Record Number: [id number] Patient Account Number: [id number] Date of Birth/Sex: Treating RN: Aug 05, 1931 (88  y.o. Kathy Velez Primary Care Shawnice Tilmon: Veleta Miners Other Clinician: Referring Dhara Schepp: Treating Kathy Velez/Extender: Jaci Lazier in Treatment: 0 Fall Risk Assessment Items Have you had 2 or more falls in the last 12 monthso 0 Yes Have you had any fall that resulted in injury in the last 12 monthso 0 Yes Kathy Velez ([id number]) [phone] [id number] Page 3 of 4 FALLS RISK SCREEN History of falling - immediate or within 3 months 0 No Secondary diagnosis (Do you have 2 or more medical diagnoseso) 0 No Ambulatory aid  None/bed rest/wheelchair/nurse 0 No Crutches/cane/walker 0 No Furniture 0 No Intravenous therapy Access/Saline/Heparin Lock 0 No Gait/Transferring Normal/ bed rest/ wheelchair 0 No Weak (short steps with or without shuffle, stooped but able to lift head while walking, may seek 0 No support from furniture) Impaired (short steps with shuffle, may have difficulty arising from chair, head down, impaired 0 No balance) Mental Status Oriented to own ability 0 No Electronic Signature(s) Signed: 01/01/2022 5:11:32 PM By: Dellie Catholic RN Entered By: Dellie Catholic on 01/01/2022 08:37:11 -------------------------------------------------------------------------------- Foot Assessment Details Patient Name: Date of Service: EA TO Kathy Velez 01/01/2022 8:00 A M Medical Record Number: [id number] Patient Account Number: [id number] Date of Birth/Sex: Treating RN: 17-Sep-1931 (89 y.o. Kathy Velez Primary Care Demarquis Osley: Veleta Miners Other Clinician: Referring Latiffany Harwick: Treating Maahi Lannan/Extender: Jaci Lazier in Treatment: 0 Foot Assessment Items Site Locations + = Sensation present, - = Sensation absent, C = Callus, U = Ulcer R = Redness, W = Warmth, M = Maceration, PU = Pre-ulcerative lesion F = Fissure, S = Swelling, D = Dryness Assessment Right: Left: Other Deformity: No No Prior Foot  Ulcer: No No Prior Amputation: No No Charcot Joint: No No Ambulatory Status: Ambulatory With Help Assistance Device: Walker GaitKIMBLEY, Velez ([id number]) [phone] [id number] Page 4 of 4 Electronic Signature(s) Signed: 01/01/2022 5:11:32 PM By: Dellie Catholic RN Entered By: Dellie Catholic on 01/01/2022 08:37:40 -------------------------------------------------------------------------------- Nutrition Risk Screening Details Patient Name: Date of Service: EA TO Kathy Velez 01/01/2022 8:00 A M Medical Record Number: [id number] Patient Account Number: [id number] Date of Birth/Sex: Treating RN: 04/13/1931 (88 y.o. Kathy Velez Primary Care Ferol Laiche: Veleta Miners Other Clinician: Referring Okla Qazi: Treating Trice Aspinall/Extender: Jaci Lazier in Treatment: 0 Height (in): 62 Weight (lbs): 120 Body Mass Index (BMI): 21.9 Nutrition Risk Screening Items Score Screening NUTRITION RISK SCREEN: I have an illness or condition that made me change the kind and/or amount of food I eat 0 No I eat fewer than two meals per day 0 No I eat few fruits and vegetables, or milk products 0 No I have three or more drinks of beer, liquor or wine almost every day 0 No I have tooth or mouth problems that make it hard for me to eat 0 No I don't always have enough money to buy the food I need 0 No I eat alone most of the time 0 No I take three or more different prescribed or over-the-counter drugs a day 0 No Without wanting to, I have lost or gained 10 pounds in the last six months 0 No I am not always physically able to shop, cook and/or feed myself 0 No Nutrition Protocols Good Risk Protocol 0 No interventions needed Moderate Risk Protocol High Risk Proctocol Risk Level: Good Risk Score: 0 Electronic Signature(s) Signed: 01/01/2022 5:11:32 PM By: Dellie Catholic RN Entered By: Dellie Catholic on 01/01/2022 08:37:28

## 2022-01-01 NOTE — Progress Notes (Addendum)
Kathy Velez, Kathy Velez ([id number]) [id number] Page 1 of 7 Visit Report for 01/01/2022 Chief Complaint Document Details Patient Name: Date of Service: Kathy TO Kathy Velez 01/01/2022 8:00 Kathy M Medical Record Number: [id number] Patient Account Number: [id number] Date of Birth/Sex: Treating RN: 1931-07-03 (88 y.o. F) Primary Care Provider: Veleta Miners Other Clinician: Referring Provider: Treating Provider/Extender: Jaci Lazier in Treatment: 0 Information Obtained from: Patient Chief Complaint Patient seen for complaints of Non-Healing Wound. Electronic Signature(s) Signed: 01/01/2022 9:28:58 AM By: Fredirick Maudlin MD FACS Entered By: Fredirick Maudlin on 01/01/2022 09:28:58 -------------------------------------------------------------------------------- HPI Details Patient Name: Date of Service: Kathy Velez, Kathy Velez 01/01/2022 8:00 Kathy M Medical Record Number: [id number] Patient Account Number: [id number] Date of Birth/Sex: Treating RN: 1931-11-14 (88 y.o. F) Primary Care Provider: Veleta Miners Other Clinician: Referring Provider: Treating Provider/Extender: Jaci Lazier in Treatment: 0 History of Present Illness HPI Description: ADMISSION 01/01/2022 This is an 88 year old woman with Kathy past medical history significant for atrial fibrillation, previously on Eliquis, vascular dementia, and colon cancer who resides in an assisted living facility. She suffered Kathy fall on October 22, 2021 that resulted in Kathy large hematoma in the parietal area of her scalp. There was no intracranial bleed or loss of consciousness. Since that initial event, she has presented to the emergency department 2 additional times due to bleeding from the scalp wound. Her Eliquis was discontinued. 2 of the bleeding incidents have involved arterial bleeding. The most recent event was December 20, 2021. General surgery has been involved. Multiple staples were placed. She  has not had any further bleeding since that time. They have been applying Medihoney daily to the site. She has an appointment later today with [facility] to have her staples removed. She is accompanied by her daughter who provides the bulk of the history. On inspection, on the posterior parietal region of her head, there is Kathy large black eschar with multiple staples embedded within it. Comparing today's inspection to photographs in the electronic medical record as well as those provided by the patient's daughter, the wound has contracted considerably. The periwound skin is intact. There is no erythema or induration. Electronic Signature(s) Signed: 01/01/2022 9:37:58 AM By: Fredirick Maudlin MD FACS Entered By: Fredirick Maudlin on 01/01/2022 09:37:58 Kathy Velez, Kathy Velez ([id number]) [id number] Page 2 of 7 -------------------------------------------------------------------------------- Physical Exam Details Patient Name: Date of Service: Kathy TO Kathy Velez 01/01/2022 8:00 Kathy M Medical Record Number: [id number] Patient Account Number: [id number] Date of Birth/Sex: Treating RN: Jun 29, 1931 (88 y.o. F) Primary Care Provider: Veleta Miners Other Clinician: Referring Provider: Treating Provider/Extender: Jaci Lazier in Treatment: 0 Constitutional Hypertensive, asymptomatic. . . . No acute distress. Respiratory Normal work of breathing on room air.. Notes 01/01/2022: On the posterior parietal region of her head, there is Kathy large black eschar with multiple staples embedded within it. Comparing today's inspection to photographs in the electronic medical record as well as those provided by the patient's daughter, the wound has contracted considerably. The periwound skin is intact. There is no erythema or induration. Electronic Signature(s) Signed: 01/01/2022 9:38:40 AM By: Fredirick Maudlin MD FACS Entered By: Fredirick Maudlin on 01/01/2022  09:38:40 -------------------------------------------------------------------------------- Physician Orders Details Patient Name: Date of Service: Kathy Velez, Kathy Velez 01/01/2022 8:00 Kathy M Medical Record Number: [id number] Patient Account Number: [id number] Date of Birth/Sex: Treating RN: 1931-05-15 (88 y.o. Kathy Velez Primary Care Provider: Veleta Miners Other Clinician: Referring Provider: Treating Provider/Extender: Jaci Lazier in  Treatment: 0 Verbal / Phone Orders: No Diagnosis Coding ICD-10 Coding Code Description L98.499 Non-pressure chronic ulcer of skin of other sites with unspecified severity S00.03XS Contusion of scalp, sequela I48.21 Permanent atrial fibrillation F01.A0 Vascular dementia, mild, without behavioral disturbance, psychotic disturbance, mood disturbance, and anxiety J84.9 Interstitial pulmonary disease, unspecified Follow-up Appointments ppointment in 2 Velez. - Dr. Celine Ahr Room 3 Thursday October 26th at 10:15 am Return Kathy Anesthetic (In clinic) Topical Lidocaine 4% applied to wound bed - In Clinic Bathing/ Shower/ Hygiene Other Bathing/Shower/Hygiene Orders/Instructions: - Please do not wash hair at this time-wash around right scalp wound. Wound Treatment Wound #1 - Head - Parietal Wound Laterality: Right Cleanser: Soap and Water 1 x Per Day/30 Days Discharge Instructions: May shower and wash wound with dial antibacterial soap and water prior to dressing change. Cleanser: Wound Cleanser 1 x Per Day/30 Days Discharge Instructions: Cleanse the wound with wound cleanser prior to applying Kathy clean dressing using gauze sponges, not tissue or cotton balls. Prim Dressing: MediHoney Gel, tube 1.5 (oz) 1 x Per Day/30 Days ary Discharge Instructions: Continue to apply to wound bed as instructed Secondary Dressing: Bordered Gauze, 2x3.75 in 1 x Per Day/30 Days Kathy Velez, Kathy Velez ([id number]) [id number] Page 3 of  7 Discharge Instructions: Apply over primary dressing as directed. Electronic Signature(s) Signed: 01/01/2022 9:38:56 AM By: Fredirick Maudlin MD FACS Entered By: Fredirick Maudlin on 01/01/2022 09:38:56 -------------------------------------------------------------------------------- Problem List Details Patient Name: Date of Service: Kathy Velez, Kathy Velez 01/01/2022 8:00 Kathy M Medical Record Number: [id number] Patient Account Number: [id number] Date of Birth/Sex: Treating RN: [birth year] (88 y.o. F) Primary Care Provider: Veleta Miners Other Clinician: Referring Provider: Treating Provider/Extender: Jaci Lazier in Treatment: 0 Active Problems ICD-10 Encounter Code Description Active Date MDM Diagnosis L98.499 Non-pressure chronic ulcer of skin of other sites with unspecified severity 01/01/2022 No Yes S00.03XS Contusion of scalp, sequela 01/01/2022 No Yes I48.21 Permanent atrial fibrillation 01/01/2022 No Yes F01.A0 Vascular dementia, mild, without behavioral disturbance, psychotic 01/01/2022 No Yes disturbance, mood disturbance, and anxiety J84.9 Interstitial pulmonary disease, unspecified 01/01/2022 No Yes Inactive Problems Resolved Problems Electronic Signature(s) Signed: 01/01/2022 8:30:41 AM By: Fredirick Maudlin MD FACS Entered By: Fredirick Maudlin on 01/01/2022 08:30:41 -------------------------------------------------------------------------------- Progress Note Details Patient Name: Date of Service: Kathy Velez, Kathy Velez 01/01/2022 8:00 Kathy M Medical Record Number: [id number] Patient Account Number: [id number] Date of Birth/Sex: Treating RN: 02/25/1932 (88 y.o. F) Primary Care Provider: Veleta Miners Other Clinician: [name] ([id number]) [id number] Page 4 of 7 Referring Provider: Treating Provider/Extender: Jaci Lazier in Treatment: 0 Subjective Chief Complaint Information obtained from Patient Patient seen  for complaints of Non-Healing Wound. History of Present Illness (HPI) ADMISSION 01/01/2022 This is an 88 year old woman with Kathy past medical history significant for atrial fibrillation, previously on Eliquis, vascular dementia, and colon cancer who resides in an assisted living facility. She suffered Kathy fall on October 22, 2021 that resulted in Kathy large hematoma in the parietal area of her scalp. There was no intracranial bleed or loss of consciousness. Since that initial event, she has presented to the emergency department 2 additional times due to bleeding from the scalp wound. Her Eliquis was discontinued. 2 of the bleeding incidents have involved arterial bleeding. The most recent event was December 20, 2021. General surgery has been involved. Multiple staples were placed. She has not had any further bleeding since that time. They have been applying Medihoney daily to the site. She has an appointment later today with [facility] to have her staples removed. She is accompanied by her daughter who provides the bulk of the history. On inspection, on the posterior parietal region of her head, there is Kathy large black eschar with multiple staples embedded within it. Comparing today's inspection to photographs in the electronic medical record as well as those provided by the patient's daughter, the wound has contracted considerably. The periwound skin is intact. There is no erythema or induration. Patient History Information obtained from Patient. Allergies codeine (Severity: Severe), bee venom protein (honey bee) (Severity: Severe), pollen extracts (Severity: Severe), molds and smuts, venom-wasp (Severity: Severe), gabapentin (Severity: Moderate), Claritin (Severity: Moderate) Family History Unknown History. Social History Never smoker, Marital Status - Widowed, Alcohol Use - Never, Drug Use - No History, Caffeine Use - Moderate. Medical History Eyes Patient has history of Cataracts -  removed bilateral Cardiovascular Patient has history of Arrhythmia - Hx: Kathy. Fib, Hypertension Neurologic Patient has history of Seizure Disorder - Hx: after heart surgery Hospitalization/Surgery History - Tricuspid Valve surgery;Colon Resection; Maximum access Posterior Lumbar interbody Fusion. Medical Kathy Surgical History Notes nd Ear/Nose/Mouth/Throat Hearing aids in bilateral ears Cardiovascular Hx: stroke; Mitral Valve Repair;Tricuspid Valve Surgery Oncologic Hx: Colon cancer Review of Systems (ROS) Constitutional Symptoms (General Health) Denies complaints or symptoms of Fatigue, Fever, Chills, Marked Weight Change. Objective Constitutional Hypertensive, asymptomatic. No acute distress. Vitals Time Taken: 8:10 AM, Height: 62 in, Source: Stated, Weight: 120 lbs, Source: Stated, BMI: 21.9, Temperature: 98.6 F, Pulse: 80 bpm, Respiratory Rate: 16 breaths/min, Blood Pressure: 151/82 mmHg. Respiratory Normal work of breathing on room air.. General Notes: 01/01/2022: On the posterior parietal region of her head, there is Kathy large black eschar with multiple staples embedded within it. Comparing today's inspection to photographs in the electronic medical record as well as those provided by the patient's daughter, the wound has contracted considerably. The periwound skin is intact. There is no erythema or induration. Integumentary (Hair, Skin) Wound #1 status is Open. Original cause of wound was Hematoma. The date acquired was: 10/22/2021. The wound is located on the Right Head - Parietal. The Kathy Velez, Kathy Velez ([id number]) [id number] Page 5 of 7 wound measures 2.6cm length x 2.2cm width x 0.1cm depth; 4.492cm^2 area and 0.449cm^3 volume. There is Fat Layer (Subcutaneous Tissue) exposed. There is no tunneling or undermining noted. There is Kathy medium amount of serosanguineous drainage noted. There is small (1-33%) red granulation within the wound bed. There is Kathy large  (67-100%) amount of necrotic tissue within the wound bed including Eschar. The periwound skin appearance had no abnormalities noted for texture. The periwound skin appearance had no abnormalities noted for color. The periwound skin appearance did not exhibit: Dry/Scaly, Maceration. Assessment Active Problems ICD-10 Non-pressure chronic ulcer of skin of other sites with unspecified severity Contusion of scalp, sequela Permanent atrial fibrillation Vascular dementia, mild, without behavioral disturbance, psychotic disturbance, mood disturbance, and anxiety Interstitial pulmonary disease, unspecified Plan Follow-up Appointments: Return Appointment in 2 Velez. - Dr. Celine Ahr Room 3 Thursday October 26th at 10:15 am Anesthetic: (In clinic) Topical Lidocaine 4% applied to wound bed - In Clinic Bathing/ Shower/ Hygiene: Other Bathing/Shower/Hygiene Orders/Instructions: - Please do not wash hair at this time-wash around right scalp wound. WOUND #1: - Head - Parietal Wound Laterality: Right Cleanser: Soap and Water 1 x Per Day/30 Days Discharge Instructions: May shower and wash wound with dial antibacterial soap and water prior to dressing change. Cleanser: Wound Cleanser 1 x Per Day/30 Days Discharge Instructions: Cleanse the wound with wound  cleanser prior to applying Kathy clean dressing using gauze sponges, not tissue or cotton balls. Prim Dressing: MediHoney Gel, tube 1.5 (oz) 1 x Per Day/30 Days ary Discharge Instructions: Continue to apply to wound bed as instructed Secondary Dressing: Bordered Gauze, 2x3.75 in 1 x Per Day/30 Days Discharge Instructions: Apply over primary dressing as directed. 01/01/2022: This is an 88 year old woman who suffered Kathy fall while on Eliquis. She developed Kathy large hematoma that has subsequently bled on 3 separate occasions requiring emergency department visits and even hospital admission. On the posterior parietal region of her head, there is Kathy large black eschar  with multiple staples embedded within it. Comparing today's inspection to photographs in the electronic medical record as well as those provided by the patient's daughter, the wound has contracted considerably. The periwound skin is intact. There is no erythema or induration. Given the patient's history of bleeding, I elected not to debride the eschar today. I encouraged her to keep her appointment with [facility] surgery for staple removal. We are going to continue to apply Medihoney to the site on Kathy daily basis. I am hoping to encourage the wound to heal underneath the eschar to the point that I can safely remove it. I did not feel comfortable doing so in clinic today, given my limited options for hemostasis. She will follow-up in 2 Velez. Electronic Signature(s) Signed: 01/01/2022 9:41:59 AM By: Fredirick Maudlin MD FACS Entered By: Fredirick Maudlin on 01/01/2022 09:41:59 -------------------------------------------------------------------------------- HxROS Details Patient Name: Date of Service: Kathy Velez, Kathy Velez 01/01/2022 8:00 Kathy M Medical Record Number: [id number] Patient Account Number: [id number] Date of Birth/Sex: Treating RN: 03-25-1932 (88 y.o. Kathy Velez Primary Care Provider: Veleta Miners Other Clinician: Referring Provider: Treating Provider/Extender: Kathy Velez in Treatment: 0 Information Obtained From Patient Constitutional Symptoms ([place]) Kathy Velez, Kathy Velez ([id number]) [id number] Page 6 of 7 Complaints and Symptoms: Negative for: Fatigue; Fever; Chills; Marked Weight Change Eyes Medical History: Positive for: Cataracts - removed bilateral Ear/Nose/Mouth/Throat Medical History: Past Medical History Notes: Hearing aids in bilateral ears Cardiovascular Medical History: Positive for: Arrhythmia - Hx: Kathy. Fib; Hypertension Past Medical History Notes: Hx: stroke; Mitral Valve Repair;Tricuspid Valve  Surgery Neurologic Medical History: Positive for: Seizure Disorder - Hx: after heart surgery Oncologic Medical History: Past Medical History Notes: Hx: Colon cancer HBO Extended History Items Eyes: Cataracts Immunizations Pneumococcal Vaccine: Received Pneumococcal Vaccination: Yes Received Pneumococcal Vaccination On or After 60th Birthday: Yes Implantable Devices No devices added Hospitalization / Surgery History Type of Hospitalization/Surgery Tricuspid Valve surgery;Colon Resection; Maximum access Posterior Lumbar interbody Fusion Family and Social History Unknown History: Yes; Never smoker; Marital Status - Widowed; Alcohol Use: Never; Drug Use: No History; Caffeine Use: Moderate; Financial Concerns: No; Food, Clothing or Shelter Needs: No; Support System Lacking: No; Transportation Concerns: No Electronic Signature(s) Signed: 01/01/2022 10:14:13 AM By: Fredirick Maudlin MD FACS Signed: 01/01/2022 5:11:32 PM By: Dellie Catholic RN Entered By: Dellie Catholic on 01/01/2022 08:33:50 -------------------------------------------------------------------------------- SuperBill Details Patient Name: Date of Service: Kathy TO NLoni Muse Velez 01/01/2022 Medical Record Number: [id number] Patient Account Number: [id number] Date of Birth/Sex: Treating RN: [birth year] (88 y.o. Kathy Velez Primary Care Provider: Veleta Miners Other Clinician: Referring Provider: Treating Provider/Extender: Jaci Lazier in Treatment: [place], Kathy Velez ([id number]) [id number] Page 7 of 7 Diagnosis Coding ICD-10 Codes Code Description L98.499 Non-pressure chronic ulcer of skin of other sites with unspecified severity S00.03XS Contusion of scalp, sequela I48.21 Permanent atrial fibrillation F01.A0 Vascular dementia, mild, without  behavioral disturbance, psychotic disturbance, mood disturbance, and anxiety J84.9 Interstitial pulmonary disease,  unspecified Facility Procedures : CPT4 Code: [id number] Description: 99213 - WOUND CARE VISIT-LEV 3 EST PT Modifier: Quantity: 1 Physician Procedures : CPT4 Code Description Modifier [id number] WC PHYS LEVEL 3 NEW PT ICD-10 Diagnosis Description L98.499 Non-pressure chronic ulcer of skin of other sites with unspecified severity S00.03XS Contusion of scalp, sequela I48.21 Permanent atrial fibrillation  F01.A0 Vascular dementia, mild, without behavioral disturbance, psychotic disturbance, mood disturbance, Quantity: 1 and anxiety Electronic Signature(s) Signed: 01/01/2022 9:42:17 AM By: Fredirick Maudlin MD FACS Entered By: Fredirick Maudlin on 01/01/2022 09:42:17

## 2022-01-02 ENCOUNTER — Encounter: Payer: Self-pay | Admitting: Internal Medicine

## 2022-01-02 DIAGNOSIS — G3184 Mild cognitive impairment, so stated: Secondary | ICD-10-CM | POA: Diagnosis not present

## 2022-01-02 DIAGNOSIS — N3000 Acute cystitis without hematuria: Secondary | ICD-10-CM | POA: Diagnosis not present

## 2022-01-02 DIAGNOSIS — R296 Repeated falls: Secondary | ICD-10-CM | POA: Diagnosis not present

## 2022-01-02 DIAGNOSIS — R278 Other lack of coordination: Secondary | ICD-10-CM | POA: Diagnosis not present

## 2022-01-02 DIAGNOSIS — M6289 Other specified disorders of muscle: Secondary | ICD-10-CM | POA: Diagnosis not present

## 2022-01-03 DIAGNOSIS — R296 Repeated falls: Secondary | ICD-10-CM | POA: Diagnosis not present

## 2022-01-03 DIAGNOSIS — N3000 Acute cystitis without hematuria: Secondary | ICD-10-CM | POA: Diagnosis not present

## 2022-01-03 DIAGNOSIS — G3184 Mild cognitive impairment, so stated: Secondary | ICD-10-CM | POA: Diagnosis not present

## 2022-01-03 DIAGNOSIS — R278 Other lack of coordination: Secondary | ICD-10-CM | POA: Diagnosis not present

## 2022-01-03 DIAGNOSIS — M6289 Other specified disorders of muscle: Secondary | ICD-10-CM | POA: Diagnosis not present

## 2022-01-04 ENCOUNTER — Inpatient Hospital Stay (HOSPITAL_BASED_OUTPATIENT_CLINIC_OR_DEPARTMENT_OTHER): Payer: Medicare Other | Admitting: Oncology

## 2022-01-04 ENCOUNTER — Inpatient Hospital Stay: Payer: Medicare Other | Attending: Oncology

## 2022-01-04 VITALS — BP 140/82 | HR 91 | Temp 98.1°F | Resp 16 | Wt 123.4 lb

## 2022-01-04 DIAGNOSIS — C185 Malignant neoplasm of splenic flexure: Secondary | ICD-10-CM | POA: Diagnosis not present

## 2022-01-04 DIAGNOSIS — E039 Hypothyroidism, unspecified: Secondary | ICD-10-CM | POA: Diagnosis not present

## 2022-01-04 DIAGNOSIS — D539 Nutritional anemia, unspecified: Secondary | ICD-10-CM | POA: Diagnosis not present

## 2022-01-04 DIAGNOSIS — Z85038 Personal history of other malignant neoplasm of large intestine: Secondary | ICD-10-CM | POA: Insufficient documentation

## 2022-01-04 DIAGNOSIS — M069 Rheumatoid arthritis, unspecified: Secondary | ICD-10-CM | POA: Diagnosis not present

## 2022-01-04 DIAGNOSIS — Z7901 Long term (current) use of anticoagulants: Secondary | ICD-10-CM | POA: Insufficient documentation

## 2022-01-04 DIAGNOSIS — G3184 Mild cognitive impairment, so stated: Secondary | ICD-10-CM | POA: Insufficient documentation

## 2022-01-04 DIAGNOSIS — I4891 Unspecified atrial fibrillation: Secondary | ICD-10-CM | POA: Insufficient documentation

## 2022-01-04 DIAGNOSIS — Z08 Encounter for follow-up examination after completed treatment for malignant neoplasm: Secondary | ICD-10-CM | POA: Diagnosis not present

## 2022-01-04 DIAGNOSIS — R5383 Other fatigue: Secondary | ICD-10-CM

## 2022-01-04 DIAGNOSIS — Z8673 Personal history of transient ischemic attack (TIA), and cerebral infarction without residual deficits: Secondary | ICD-10-CM | POA: Insufficient documentation

## 2022-01-04 LAB — CBC WITH DIFFERENTIAL (CANCER CENTER ONLY)
Abs Immature Granulocytes: 0.04 10*3/uL (ref 0.00–0.07)
Basophils Absolute: 0.1 10*3/uL (ref 0.0–0.1)
Basophils Relative: 1 %
Eosinophils Absolute: 0.2 10*3/uL (ref 0.0–0.5)
Eosinophils Relative: 2 %
HCT: 29.8 % — ABNORMAL LOW (ref 36.0–46.0)
Hemoglobin: 9.7 g/dL — ABNORMAL LOW (ref 12.0–15.0)
Immature Granulocytes: 1 %
Lymphocytes Relative: 26 %
Lymphs Abs: 2.1 10*3/uL (ref 0.7–4.0)
MCH: 36.2 pg — ABNORMAL HIGH (ref 26.0–34.0)
MCHC: 32.6 g/dL (ref 30.0–36.0)
MCV: 111.2 fL — ABNORMAL HIGH (ref 80.0–100.0)
Monocytes Absolute: 1 10*3/uL (ref 0.1–1.0)
Monocytes Relative: 13 %
Neutro Abs: 4.7 10*3/uL (ref 1.7–7.7)
Neutrophils Relative %: 57 %
Platelet Count: 258 10*3/uL (ref 150–400)
RBC: 2.68 MIL/uL — ABNORMAL LOW (ref 3.87–5.11)
RDW: 18.2 % — ABNORMAL HIGH (ref 11.5–15.5)
WBC Count: 8.1 10*3/uL (ref 4.0–10.5)
nRBC: 0 % (ref 0.0–0.2)

## 2022-01-04 LAB — TSH: TSH: 22.196 u[IU]/mL — ABNORMAL HIGH (ref 0.350–4.500)

## 2022-01-04 LAB — T4, FREE: Free T4: 0.68 ng/dL (ref 0.61–1.12)

## 2022-01-04 MED ORDER — LEVOTHYROXINE SODIUM 100 MCG PO TABS: 100.0000 ug | ORAL_TABLET | Freq: Every day | ORAL | 2 refills | Status: DC

## 2022-01-04 NOTE — Progress Notes (Signed)
Kathy OFFICE PROGRESS NOTE   Diagnosis: Colon cancer, anemia  INTERVAL HISTORY:   Kathy Velez is here today with her daughter.  She has experienced several recent falls.  She was seen in the emergency room 10/27/2021 a scalp hematoma.  The hematoma persisted with an overlying scab and intermittent bleeding. On 12/06/2021 she had increased bleeding from the scalp.  She was seen in the emergency room and sutures were applied.  Apixaban was placed on hold.  She was transfused 1 unit of packed red blood cells. Neurology recommended she remain off of anticoagulation therapy.  Kathy Velez has hand arthralgias.  She has dark stool and is taking iron.  There is a scab at the right parietal scalp wound.  She has a dry mouth.  Her energy level is diminished.  She is participating in an exercise program.  Her appetite has decreased.  No abdominal pain.  Objective:  Vital signs in last 24 hours:  Blood pressure (!) 140/82, pulse 91, temperature 98.1 F (36.7 C), temperature source Temporal, resp. rate 16, weight 123 lb 6.4 oz (56 kg), SpO2 98 %.    HEENT: The mouth is dry, no thrush Resp: Scattered end inspiratory coarse rhonchi, no respiratory distress Cardio: Irregular GI: Nontender, no hepatosplenomegaly, no mass Vascular: No leg edema  Skin: Scab at the right parietal scalp  Lab Results:  Lab Results  Component Value Date   WBC 8.1 01/04/2022   HGB 9.7 (L) 01/04/2022   HCT 29.8 (L) 01/04/2022   MCV 111.2 (H) 01/04/2022   PLT 258 01/04/2022   NEUTROABS 4.7 01/04/2022    CMP  Lab Results  Component Value Date   NA 135 (A) 12/29/2021   K 4.5 12/29/2021   CL 99 12/29/2021   CO2 23 (A) 12/29/2021   GLUCOSE 91 12/22/2021   BUN 13 12/29/2021   CREATININE 0.9 12/29/2021   CALCIUM 9.1 12/29/2021   PROT 7.1 10/03/2021   ALBUMIN 4.5 10/03/2021   AST 22 10/03/2021   ALT 18 10/03/2021   ALKPHOS 75 10/03/2021   BILITOT 0.8 10/03/2021   GFRNONAA >60 12/22/2021    GFRAA 79.35 02/02/2021    Lab Results  Component Value Date   CEA 3.83 05/16/2021    Lab Results  Component Value Date   INR 1.0 12/20/2021   LABPROT 13.5 12/20/2021    Imaging:  No results found.  Medications: I have reviewed the patient's current medications.   Assessment/Plan: Colon cancer-splenic flexure mass on colonoscopy 02/22/2021 Biopsy 02/22/2021-invasive well differentiated adenocarcinoma, loss of MLH1 and PMS2 expression, MSI high, MLH1 hyper methylation present CTs 03/31/2021-no evidence of metastatic disease, possible mass at the splenic flexure, 4 mm left upper lung nodule, bilateral lung scarring Cycle 1 pembrolizumab 06/13/2021 Cycle 2 pembrolizumab 07/04/2021 Cycle 3 pembrolizumab 07/25/2021 Cycle 4 pembrolizumab 08/22/2021 Colonoscopy 09/20/2021-no residual mass at the splenic flexure/descending colon Macrocytic anemia Sessile serrated polyp with focus of high-grade dysplasia on colonoscopy 02/22/2021 Colon cancer 2006, status post a right colectomy-"node positive "cancer per patient, received adjuvant chemotherapy Atrial fibrillation, maintained on anticoagulation History of a CVA and TIA Mild cognitive impairment Rheumatoid arthritis Hypothyroidism 10.  Chronic lung disease?    Disposition: Kathy Velez has a history of colon cancer and entered clinic remission after completing a course of pembrolizumab.  There is no clinical evidence of recurrent colon cancer.  She is experienced multiple recent falls with bleeding from the scalp and severe anemia.  The hemoglobin is higher over the past several weeks.  I suspect the dark stool is related to iron therapy.  The anemia secondary to recent bleeding, chronic disease, and probable myelodysplasia.  Her daughter will contact us if she develops symptoms of anemia.  The TSH is high today.  We adjusted the thyroid hormone dose.    She remains on single agent leflunomide for treatment of rheumatoid arthritis.  She  will try candy and artificial saliva for the mouth dryness.  The dryness is potentially related to toxicity from pembrolizumab.  Kathy Velez will return for an office and lab visit in 2 months.  I am available to see her sooner as needed.  Betsy Coder, MD  01/04/2022  1:38 PM

## 2022-01-04 NOTE — Patient Instructions (Signed)
Increase levothyroxine dose to 100 mcg daily. Prescription has been sent to [facility].

## 2022-01-05 DIAGNOSIS — G3184 Mild cognitive impairment, so stated: Secondary | ICD-10-CM | POA: Diagnosis not present

## 2022-01-05 DIAGNOSIS — R296 Repeated falls: Secondary | ICD-10-CM | POA: Diagnosis not present

## 2022-01-05 DIAGNOSIS — M6289 Other specified disorders of muscle: Secondary | ICD-10-CM | POA: Diagnosis not present

## 2022-01-05 DIAGNOSIS — N3000 Acute cystitis without hematuria: Secondary | ICD-10-CM | POA: Diagnosis not present

## 2022-01-05 DIAGNOSIS — R278 Other lack of coordination: Secondary | ICD-10-CM | POA: Diagnosis not present

## 2022-01-06 DIAGNOSIS — D649 Anemia, unspecified: Secondary | ICD-10-CM | POA: Diagnosis not present

## 2022-01-06 DIAGNOSIS — D509 Iron deficiency anemia, unspecified: Secondary | ICD-10-CM | POA: Diagnosis not present

## 2022-01-06 DIAGNOSIS — E871 Hypo-osmolality and hyponatremia: Secondary | ICD-10-CM | POA: Diagnosis not present

## 2022-01-06 LAB — CBC AND DIFFERENTIAL
HCT: 29 — AB (ref 36–46)
Hemoglobin: 9.4 — AB (ref 12.0–16.0)
WBC: 5.8

## 2022-01-06 LAB — TSH: TSH: 22.7 — AB (ref <=5.90)

## 2022-01-06 LAB — CBC: RBC: 2.61 — AB (ref 3.87–5.11)

## 2022-01-08 ENCOUNTER — Non-Acute Institutional Stay: Payer: Medicare Other | Admitting: Adult Health

## 2022-01-08 ENCOUNTER — Other Ambulatory Visit: Payer: Self-pay | Admitting: Internal Medicine

## 2022-01-08 DIAGNOSIS — S0101XD Laceration without foreign body of scalp, subsequent encounter: Secondary | ICD-10-CM

## 2022-01-09 ENCOUNTER — Encounter: Payer: Self-pay | Admitting: Adult Health

## 2022-01-09 DIAGNOSIS — N3000 Acute cystitis without hematuria: Secondary | ICD-10-CM | POA: Diagnosis not present

## 2022-01-09 DIAGNOSIS — M6289 Other specified disorders of muscle: Secondary | ICD-10-CM | POA: Diagnosis not present

## 2022-01-09 DIAGNOSIS — G3184 Mild cognitive impairment, so stated: Secondary | ICD-10-CM | POA: Diagnosis not present

## 2022-01-09 DIAGNOSIS — R296 Repeated falls: Secondary | ICD-10-CM | POA: Diagnosis not present

## 2022-01-09 DIAGNOSIS — R278 Other lack of coordination: Secondary | ICD-10-CM | POA: Diagnosis not present

## 2022-01-09 NOTE — Progress Notes (Signed)
Location:  Occupational psychologist of Service:  ALF (13) Provider:   Cindi Carbon, [place] [phone]   Kathy Dad, MD  Patient Care Team: Kathy Dad, MD as PCP - General (Internal Medicine) Kathy Pickett Nadean Corwin, MD as PCP - Cardiology (Cardiology) Kathy Partridge, DO as Consulting Physician (Neurology)  Extended Emergency Contact Information Primary Emergency Contact: Kathy Velez,Kathy Velez Address: [place], [place] [address] Kathy Velez of [place] Phone: [phone] Mobile Phone: [phone] Relation: Daughter Secondary Emergency Contact: Kathy Velez Address: [address]          [place], [place] [address] Kathy Velez of [place] Mobile Phone: [phone] Relation: Son   Goals of care: Advanced Directive information    01/04/2022   10:00 AM  Advanced Directives  Does Patient Have a Medical Advance Directive? Yes  Type of Paramedic of [place];Living will  Copy of [place] in Chart? Yes - validated most recent copy scanned in chart (See row information)     Chief Complaint  Patient presents with   Acute Visit    Wound drainage    HPI:  Pt is a 88 y.o. female seen today for an acute visit for possible wound drainage.   Nurse reports there is small opening of the scalp wound with purulent drainage. No warmth.. NO swelling. No fever. [name] reports she has not noticed any drainage or increase in pain (has dementia)  The history behind the wound is that she had a closed head injury and was in the ED 7/30.  This developed a hematoma which was difficulty to absorb.  Later led to an arterial bleed for which she had staples placed and vessel tie off. 9/13  Later when the staples were removed the area re bled 9/27 and she came back to the hospital with bleeding which was significant. She had another arterial bleed. Surgery was consulted. 8 staples were placed.  Eliquis has been discontinued per cardiology due to the bleeding. She is now followed by wound care center and was seen on 10/9.  Medihoney dressing ordered. She is only on aspirin. No further bleeding.    Past Medical History:  Diagnosis Date   Allergy    seasonal   Anemia    Anxiety    Arthritis    Back   Atrial fibrillation ([place])    Cancer ([place]) 2006   Colon.  Basal Cell Skin cancer- right arm   Cataract    removed bilateral   Colon cancer ([place])    Constipation due to pain medication therapy    after heart surgery   Dysrhythmia    PAF   GERD (gastroesophageal reflux disease)    Heart murmur    Hyperlipidemia    Hypertension    Hypothyroidism    RA (rheumatoid arthritis) ([place])    Restless leg    Seizures ([place])    after Heart Surgey   Stroke ([place])    TIA- found by neurologist after    Past Surgical History:  Procedure Laterality Date   ABDOMINAL HYSTERECTOMY  1970   Partial    COLON RESECTION  2006   cancer   COLON SURGERY     COLONOSCOPY     EYE SURGERY Bilateral    Cataract   MAXIMUM ACCESS (MAS)POSTERIOR LUMBAR INTERBODY FUSION (PLIF) 2 LEVEL N/A 04/12/2015   Procedure: Lumbar Three-Five Decompression, Pedicle Screw Fixation, and Posteriolateral  Arthrodesis;  Surgeon: Erline Levine, MD;  Location: [facility];  Service: Neurosurgery;  Laterality: N/A;  L3-4 L4-5 Maximum access posterior lumbar fusion, possible interbodies and resection of synovial cyst at L4-5   MITRAL VALVE REPAIR  01/20/2013   Gore-tex cords to P1, P2, and P3. Magic suture to posterior medial commisure, #30 Physio 1 ring. Done in [place]   TONSILLECTOMY     about [place]  01/20/2013   #28 TriAd ring done in [place]    Allergies  Allergen Reactions   Claritin [Loratadine] Other (See Comments)    Listed on MAR Unknown reaction   Codeine Other (See Comments)    "just don't take it well"   Gabapentin Other (See Comments)    Dizziness    Molds & Smuts     dust    Pollen Extract Swelling    Grass   Bee Venom Swelling and Rash   Wasp Venom Swelling and Rash    Outpatient Encounter Medications as of 01/08/2022  Medication Sig   aspirin EC 81 MG tablet Take 1 tablet (81 mg total) by mouth daily. Swallow whole. (Patient not taking: Reported on 01/01/2022)   atorvastatin (LIPITOR) 20 MG tablet TAKE 1 TABLET DAILY   Cholecalciferol (VITAMIN D) 50 MCG (2000 UT) CAPS Take 2,000 Units by mouth in the morning.   digoxin (LANOXIN) 0.125 MG tablet TAKE 1 TABLET DAILY (Patient taking differently: Take 0.125 mg by mouth daily.)   ferrous sulfate 325 (65 FE) MG tablet Take 325 mg by mouth daily with breakfast.   hydrocortisone cream 1 % Apply 1 Application topically in the morning. To both palms (Patient not taking: Reported on 01/04/2022)   leflunomide (ARAVA) 20 MG tablet Take 20 mg by mouth in the morning.   levothyroxine (SYNTHROID) 100 MCG tablet Take 1 tablet (100 mcg total) by mouth daily before breakfast.   metoprolol succinate (TOPROL-XL) 50 MG 24 hr tablet TAKE 1 TABLET DAILY (Patient taking differently: Take 50 mg by mouth daily.)   mirtazapine (REMERON) 15 MG tablet TAKE 1 TABLET AT BEDTIME (Patient taking differently: Take 15 mg by mouth at bedtime.)   Multiple Vitamin (MULTIVITAMIN) tablet Take 1 tablet by mouth in the morning.   Multiple Vitamins-Minerals (PRESERVISION AREDS 2) CAPS Take 1 capsule by mouth 2 (two) times daily.   omeprazole (PRILOSEC) 40 MG capsule TAKE 1 CAPSULE DAILY (Patient taking differently: Take 40 mg by mouth daily.)   sodium chloride 1 g tablet Take 1 g by mouth in the morning.   valsartan (DIOVAN) 80 MG tablet Take 0.5 tablets (40 mg total) by mouth daily.   Vibegron (GEMTESA) 75 MG TABS Take 32.5 mg by mouth at bedtime.   vitamin B-12 (CYANOCOBALAMIN) 1000 MCG tablet Take 1,000 mcg by mouth in the morning.   No facility-administered encounter medications on file as of 01/08/2022.    Review of Systems  Constitutional:   Negative for activity change, appetite change, chills, fatigue, fever and unexpected weight change.  Skin:  Positive for wound. Negative for color change, pallor and rash.    Immunization History  Administered Date(s) Administered   Fluad Quad(high Dose 65+) 01/05/2019, 12/07/2021   H1N1 04/07/2008   Influenza Whole 12/29/2008, 12/22/2009   Influenza, High Dose Seasonal PF 01/15/2012, 12/23/2014, 01/22/2020   Influenza,inj,Quad PF,6+ Mos 01/22/2014, 12/10/2016, 01/17/2018   Influenza,trivalent, recombinat, inj, PF 01/05/2016   Influenza-Unspecified 04/07/2008, 12/15/2014, 01/13/2021   Moderna Sars-Covid-2 Vaccination 04/07/2019, 05/05/2019, 02/09/2020   PFIZER(Purple Top)SARS-COV-2 Vaccination  01/06/2021   PPD Test 07/17/2010, 04/19/2015, 04/25/2015   Pneumococcal Conjugate-13 11/10/2013   Pneumococcal Polysaccharide-23 12/27/2010, 12/05/2016   Tdap 04/15/2017   Zoster Recombinat (Shingrix) 04/09/2017, 06/18/2017   Pertinent  Health Maintenance Due  Topic Date Due   INFLUENZA VACCINE  Completed   DEXA SCAN  Completed      12/21/2021   10:22 AM 12/21/2021   10:00 PM 12/23/2021    8:08 AM 01/01/2022   11:05 AM 01/04/2022   10:00 AM  Fall Risk  Falls in the past year?    1   Was there an injury with Fall?    1   Fall Risk Category Calculator    3   Fall Risk Category    High   Patient Fall Risk Level Moderate fall risk High fall risk High fall risk High fall risk High fall risk   Functional Status Survey:    Vitals:   01/09/22 0900  BP: (!) 151/80  Pulse: 78  Resp: 20  Temp: 97.9 F (36.6 C)  SpO2: 96%   There is no height or weight on file to calculate BMI. Physical Exam Vitals reviewed.  Constitutional:      Appearance: Normal appearance.  Skin:    General: Skin is warm and dry.     Findings: No erythema.     Comments: Posterior scalp wound with pink wound bed. No drainage. No swelling. No tenderness. No warmth. No surrounding erythema.   Neurological:      Mental Status: She is alert. Mental status is at baseline.  Psychiatric:        Mood and Affect: Mood normal.     Labs reviewed: Recent Labs    12/07/21 0548 12/20/21 2238 12/22/21 0924 12/29/21 0000  NA 137 133* 134* 135*  K 4.2 4.4 4.4 4.5  CL 104 99 104 99  CO2 '23 24 22 '$ 23*  GLUCOSE 90 106* 91  --   BUN '15 16 14 13  '$ CREATININE 0.79 0.78 0.85 0.9  CALCIUM 9.1 9.3 8.9 9.1  MG 1.6*  --   --   --    Recent Labs    07/25/21 0945 08/22/21 1134 10/03/21 1055  AST '20 22 22  '$ ALT '18 20 18  '$ ALKPHOS 86 84 75  BILITOT 0.6 0.6 0.8  PROT 6.8 7.5 7.1  ALBUMIN 4.3 4.5 4.5   Recent Labs    12/06/21 0211 12/06/21 0313 12/07/21 0548 12/13/21 0000 12/21/21 0318 12/21/21 1028 12/22/21 0924 12/29/21 0000 01/04/22 0937 01/06/22 0000  WBC 9.9   < > 7.2   < > 11.7*  --  8.6 6.3 8.1 5.8  NEUTROABS 4.1  --  4.0  --   --   --   --   --  4.7  --   HGB 8.5*   < > 8.7*   < > 7.4*   < > 8.1* 8.9* 9.7* 9.4*  HCT 25.3*   < > 25.0*   < > 23.2*   < > 24.8* 27* 29.8* 29*  MCV 101.2*   < > 98.4   < > 109.4*  --  108.8*  --  111.2*  --   PLT 186   < > 141*   < > 216  --  206  --  258  --    < > = values in this interval not displayed.   Lab Results  Component Value Date   TSH 22.70 (A) 01/06/2022   Lab Results  Component Value  Date   HGBA1C 5.5 01/12/2013   Lab Results  Component Value Date   CHOL 115 02/02/2021   HDL 48 02/02/2021   LDLCALC 49 02/02/2021   TRIG 91 02/02/2021    Significant Diagnostic Results in last 30 days:  No results found.  Assessment/Plan 1. Laceration of scalp without foreign body, subsequent encounter I do not see sign of infection at this time.  The wound is healing Needs close monitoring, going to wound care center Continue current med honey dressing changes.     Family/ staff Communication: resident and nurse  Labs/tests ordered:  NA

## 2022-01-11 DIAGNOSIS — M6289 Other specified disorders of muscle: Secondary | ICD-10-CM | POA: Diagnosis not present

## 2022-01-11 DIAGNOSIS — G3184 Mild cognitive impairment, so stated: Secondary | ICD-10-CM | POA: Diagnosis not present

## 2022-01-11 DIAGNOSIS — R278 Other lack of coordination: Secondary | ICD-10-CM | POA: Diagnosis not present

## 2022-01-11 DIAGNOSIS — N3000 Acute cystitis without hematuria: Secondary | ICD-10-CM | POA: Diagnosis not present

## 2022-01-11 DIAGNOSIS — R296 Repeated falls: Secondary | ICD-10-CM | POA: Diagnosis not present

## 2022-01-15 DIAGNOSIS — G3184 Mild cognitive impairment, so stated: Secondary | ICD-10-CM | POA: Diagnosis not present

## 2022-01-15 DIAGNOSIS — M6289 Other specified disorders of muscle: Secondary | ICD-10-CM | POA: Diagnosis not present

## 2022-01-15 DIAGNOSIS — R296 Repeated falls: Secondary | ICD-10-CM | POA: Diagnosis not present

## 2022-01-15 DIAGNOSIS — N3000 Acute cystitis without hematuria: Secondary | ICD-10-CM | POA: Diagnosis not present

## 2022-01-15 DIAGNOSIS — R278 Other lack of coordination: Secondary | ICD-10-CM | POA: Diagnosis not present

## 2022-01-17 DIAGNOSIS — M6289 Other specified disorders of muscle: Secondary | ICD-10-CM | POA: Diagnosis not present

## 2022-01-17 DIAGNOSIS — N3000 Acute cystitis without hematuria: Secondary | ICD-10-CM | POA: Diagnosis not present

## 2022-01-17 DIAGNOSIS — R278 Other lack of coordination: Secondary | ICD-10-CM | POA: Diagnosis not present

## 2022-01-17 DIAGNOSIS — R296 Repeated falls: Secondary | ICD-10-CM | POA: Diagnosis not present

## 2022-01-17 DIAGNOSIS — G3184 Mild cognitive impairment, so stated: Secondary | ICD-10-CM | POA: Diagnosis not present

## 2022-01-18 ENCOUNTER — Ambulatory Visit (HOSPITAL_BASED_OUTPATIENT_CLINIC_OR_DEPARTMENT_OTHER): Payer: Medicare Other | Admitting: General Surgery

## 2022-01-18 NOTE — Progress Notes (Signed)
Cardiology Clinic Note   Patient Name: Kathy Velez Date of Encounter: 01/19/2022  Primary Care Provider:  Virgie Dad, MD Primary Cardiologist:  Pixie Casino, MD  Patient Profile    88 year old female  PAF CHA2DS2-VASc score  4, prior colon cancer in remission. Recent admission after bleeding requiring admission for bleeding from the scalp, found to have large hematoma with arterial bleeding requiring acute intervention and associated with significant blood loss requiring PRBC transfusion after a fall.  She has had 2 falls in the prior 6 weeks to admission.    After sustaining a mechanical fall in July 2023, the patient had a large hematoma along with laceration requiring staples.  She was readmitted on 12/06/2021 after scalp incision began to bleed with large hematoma increasing.  At this point she did require the blood transfusion, and apixaban was held.  Since that time she has moved from her apartment to assisted living at [facility] where she is checked on every 2 hours.  She is seen by primary care provider Dr. Lyndel Safe every couple of months along with her neurologist, and hematologist.  She has had no further bleeding, falls, or dizziness.   Other history of mild hyponatremia presumably SIADH baseline 130, hypothyroidism, RA, iron deficiency anemia, hypertension, mixed HL.   On last office visit she was found to be anemic and I did not restart Eliquis as this was discontinued in the setting of her fall and hematoma.  I did start her on 81 mg of aspirin enteric-coated.  There were to call if she had bleeding or oozing from her laceration site.  CBC was reordered.  Follow-up CBC hemoglobin of 9.4 hematocrit of 29.   Past Medical History    Past Medical History:  Diagnosis Date   Allergy    seasonal   Anemia    Anxiety    Arthritis    Back   Atrial fibrillation ([place])    Cancer ([place]) 2006   Colon.  Basal Cell Skin cancer- right arm   Cataract    removed bilateral   Colon  cancer ([facility])    Constipation due to pain medication therapy    after heart surgery   Dysrhythmia    PAF   GERD (gastroesophageal reflux disease)    Heart murmur    Hyperlipidemia    Hypertension    Hypothyroidism    RA (rheumatoid arthritis) ([place])    Restless leg    Seizures ([place])    after Heart Surgey   Stroke ([place])    TIA- found by neurologist after    Past Surgical History:  Procedure Laterality Date   ABDOMINAL HYSTERECTOMY  1970   Partial    COLON RESECTION  2006   cancer   COLON SURGERY     COLONOSCOPY     EYE SURGERY Bilateral    Cataract   MAXIMUM ACCESS (MAS)POSTERIOR LUMBAR INTERBODY FUSION (PLIF) 2 LEVEL N/A 04/12/2015   Procedure: Lumbar Three-Five Decompression, Pedicle Screw Fixation, and Posteriolateral Arthrodesis;  Surgeon: Erline Levine, MD;  Location: [facility] [facility];  Service: Neurosurgery;  Laterality: N/A;  L3-4 L4-5 Maximum access posterior lumbar fusion, possible interbodies and resection of synovial cyst at L4-5   MITRAL VALVE REPAIR  01/20/2013   Gore-tex cords to P1, P2, and P3. Magic suture to posterior medial commisure, #30 Physio 1 ring. Done in [place]   TONSILLECTOMY     about [place]  01/20/2013   #28 TriAd ring done in  [place]    Allergies  Allergies  Allergen Reactions   Claritin [Loratadine] Other (See Comments)    Listed on MAR Unknown reaction   Codeine Other (See Comments)    "just don't take it well"   Gabapentin Other (See Comments)    Dizziness    Molds & Smuts     dust   Pollen Extract Swelling    Grass   Bee Venom Swelling and Rash   Wasp Venom Swelling and Rash    History of Present Illness    Mrs. Montfort returns today for ongoing assessment and management of atrial fibrillation and hypertension.  She had been on Eliquis but this was discontinued due to fall with laceration to scalp and anemia.  Has followed up with Royal Hawthorn, NP and has had documentation that wound is healing without signs  of infection.  She has since been seen by neurology who would not recommend restarting Eliquis.  Neurology is comfortable with aspirin.  She has not had any further falls, she is feeling well and depends upon her daughter greatly to assist her during office visits.  She is currently at assisted living at a skilled nursing facility.  Home Medications    Current Outpatient Medications  Medication Sig Dispense Refill   aspirin EC 81 MG tablet Take 1 tablet (81 mg total) by mouth daily. Swallow whole. 30 tablet 3   atorvastatin (LIPITOR) 20 MG tablet TAKE 1 TABLET DAILY 90 tablet 3   Cholecalciferol (VITAMIN D) 50 MCG (2000 UT) CAPS Take 2,000 Units by mouth in the morning.     digoxin (LANOXIN) 0.125 MG tablet TAKE 1 TABLET DAILY 90 tablet 3   ferrous sulfate 325 (65 FE) MG tablet Take 325 mg by mouth daily with breakfast.     leflunomide (ARAVA) 20 MG tablet Take 20 mg by mouth in the morning.     levothyroxine (SYNTHROID) 100 MCG tablet Take 1 tablet (100 mcg total) by mouth daily before breakfast. 90 tablet 2   metoprolol succinate (TOPROL-XL) 50 MG 24 hr tablet TAKE 1 TABLET DAILY 90 tablet 3   mirtazapine (REMERON) 15 MG tablet TAKE 1 TABLET AT BEDTIME 90 tablet 3   Multiple Vitamin (MULTIVITAMIN) tablet Take 1 tablet by mouth in the morning.     Multiple Vitamins-Minerals (PRESERVISION AREDS 2) CAPS Take 1 capsule by mouth 2 (two) times daily.     omeprazole (PRILOSEC) 40 MG capsule TAKE 1 CAPSULE DAILY 90 capsule 3   sodium chloride 1 g tablet Take 1 g by mouth in the morning.     valsartan (DIOVAN) 80 MG tablet Take 0.5 tablets (40 mg total) by mouth daily. 15 tablet 0   Vibegron (GEMTESA) 75 MG TABS Take 32.5 mg by mouth at bedtime. 30 tablet 5   vitamin B-12 (CYANOCOBALAMIN) 1000 MCG tablet Take 1,000 mcg by mouth in the morning.     No current facility-administered medications for this visit.     Family History    Family History  Problem Relation Age of Onset   Lung disease  Mother 14   Heart disease Father 44   Colon polyps Daughter    Alcohol abuse Son    Colon cancer Neg Hx    Stomach cancer Neg Hx    Rectal cancer Neg Hx    Crohn's disease Neg Hx    Esophageal cancer Neg Hx    She indicated that her mother is deceased. She indicated that her father is deceased. She indicated that  her daughter is alive. She indicated that her son is deceased. She indicated that the status of her neg hx is unknown.  Social History    Social History   Socioeconomic History   Marital status: Widowed    Spouse name: Not on file   Number of children: Not on file   Years of education: Not on file   Highest education level: Not on file  Occupational History   Occupation: elementary school teacher    Comment: retired   Tobacco Use   Smoking status: Never    Passive exposure: Past ("mother smoked")   Smokeless tobacco: Never  Vaping Use   Vaping Use: Never used  Substance and Sexual Activity   Alcohol use: Yes    Alcohol/week: 1.0 standard drink of alcohol    Types: 1 Glasses of wine per week    Comment: 1-2 per week   Drug use: No   Sexual activity: Not Currently  Other Topics Concern   Not on file  Social History Narrative   Social History      Diet? Healthy- low salt, sugar, fat      Do you drink/eat things with caffeine? On occasion      Marital status?        widow                        What year were you married? 1958      Do you live in a house, apartment, assisted living, condo, trailer, etc.? apartment      Is it one or more stories? one      How many persons live in your home? one      Do you have any pets in your home? (please list) no      Highest level of education completed? 4 year college      Current or past profession: Statistician      Do you exercise?             yes                         Type & how often? Classes- senior retirement community/ some walking      Advanced Directives      Do you have a living will?       Do you have a DNR form?                                  If not, do you want to discuss one?      Do you have signed POA/HPOA for forms?       Functional Status Completed by: Daughter, Di Kindle      Do you have difficulty bathing or dressing yourself? no      Do you have difficulty preparing food or eating? no      Do you have difficulty managing your medications? no      Do you have difficulty managing your finances? no      Do you have difficulty affording your medications? no   Right handed   [facility]   Social Determinants of Health   Financial Resource Strain: Low Risk  (01/28/2018)   Overall Financial Resource Strain (CARDIA)    Difficulty of Paying Living Expenses: Not hard at all  Food Insecurity: No Food Insecurity (12/21/2021)  Hunger Vital Sign    Worried About Running Out of Food in the Last Year: Never true    Ran Out of Food in the Last Year: Never true  Transportation Needs: No Transportation Needs (12/21/2021)   PRAPARE - Hydrologist (Medical): No    Lack of Transportation (Non-Medical): No  Physical Activity: Sufficiently Active (01/28/2018)   Exercise Vital Sign    Days of Exercise per Week: 6 days    Minutes of Exercise per Session: 30 min  Stress: No Stress Concern Present (01/28/2018)   [place]    Feeling of Stress : Only a little  Social Connections: Moderately Isolated (01/28/2018)   Social Connection and Isolation Panel [NHANES]    Frequency of Communication with Friends and Family: More than three times a week    Frequency of Social Gatherings with Friends and Family: More than three times a week    Attends Religious Services: Never    Marine scientist or Organizations: No    Attends Archivist Meetings: Never    Marital Status: Widowed  Intimate Partner Violence: Not At Risk (12/21/2021)   Humiliation, Afraid, Rape, and  Kick questionnaire    Fear of Current or Ex-Partner: No    Emotionally Abused: No    Physically Abused: No    Sexually Abused: No     Review of Systems    General:  No chills, fever, night sweats or weight changes.  Cardiovascular:  No chest pain, dyspnea on exertion, edema, orthopnea, palpitations, paroxysmal nocturnal dyspnea. Dermatological: No rash, lesions/masses Respiratory: No cough, dyspnea Urologic: No hematuria, dysuria Abdominal:   No nausea, vomiting, diarrhea, bright red blood per rectum, melena, or hematemesis Neurologic:  No visual changes, wkns, changes in mental status. All other systems reviewed and are otherwise negative except as noted above.     Physical Exam    VS:  BP 110/70 (BP Location: Left Arm, Patient Position: Sitting, Cuff Size: Normal)   Pulse 82   Ht '5\' 2"'  (1.575 m)   Wt 124 lb (56.2 kg)   SpO2 97%   BMI 22.68 kg/m  , BMI Body mass index is 22.68 kg/m.     GEN: Well nourished, well developed, in no acute distress.  Frail HEENT: normal. Neck: Supple, no JVD, carotid bruits, or masses. Cardiac: Irregular RRR, no murmurs, rubs, or gallops. No clubbing, cyanosis, edema.  Radials/DP/PT 2+ and equal bilaterally.  Respiratory:  Respirations regular and unlabored, clear to auscultation bilaterally. GI: Soft, nontender, nondistended, BS + x 4. MS: no deformity or atrophy.  Well-healed laceration to upper right parietal area of scalp. Skin: warm and dry, no rash. Neuro:  Strength and sensation are intact. Psych: Normal affect.  Memory loss  Accessory Clinical Findings    ECG personally reviewed by me today-atrial fibrillation with PVCs heart rate of 82 bpm, nonspecific ST-T wave abnormality.- No acute changes  Lab Results  Component Value Date   WBC 5.8 01/06/2022   HGB 9.4 (A) 01/06/2022   HCT 29 (A) 01/06/2022   MCV 111.2 (H) 01/04/2022   PLT 258 01/04/2022   Lab Results  Component Value Date   CREATININE 0.9 12/29/2021   BUN 13  12/29/2021   NA 135 (A) 12/29/2021   K 4.5 12/29/2021   CL 99 12/29/2021   CO2 23 (A) 12/29/2021   Lab Results  Component Value Date   ALT 18 10/03/2021   AST  22 10/03/2021   ALKPHOS 75 10/03/2021   BILITOT 0.8 10/03/2021   Lab Results  Component Value Date   CHOL 115 02/02/2021   HDL 48 02/02/2021   LDLCALC 49 02/02/2021   TRIG 91 02/02/2021    Lab Results  Component Value Date   HGBA1C 5.5 01/12/2013    Review of Prior Studies: Echocardiogram 11/27/2018:    1. The left ventricle has normal systolic function with an ejection  fraction of 60-65%. The cavity size was normal. There is mildly increased  left ventricular wall thickness. Left ventricular diastolic function could  not be evaluated secondary to atrial   fibrillation. No evidence of left ventricular regional wall motion  abnormalities.   2. Left atrial size was severely dilated.   3. A 30 mm PhysioRing valve is present in the mitral position.   4. Status post tricuspid valve repair. 28 mm TriAd annuloplasty ring is  present in the tricuspid position.   5. The tricuspid valve is abnormal.   6. The aortic valve is tricuspid. Mild sclerosis of the aortic valve. No  stenosis of the aortic valve.   7. The aorta is normal unless otherwise noted.   8. The inferior vena cava was normal in size with <50% respiratory  variability.   SUMMARY     LVEF 60-65%, mild LVH, normal wall motion, midly reduced RV systolic  function, severe LAE, s/p 30 mm Physio-Ring MV repair with no  stenosis and mild MR, s/p 29 mm TriAd annuloplasty ring, no stenosis  and trivial TR, aortic valve sclerosis, IVC suggests elevated RA  pressure of 8 mmHg.     Assessment & Plan   1.  Permanent atrial fibrillation: We will not restart anticoagulation therapy, i.e. Eliquis, but instead begin enteric-coated aspirin 81 mg daily.  A prescription has been written for this so that she can take [facility] to [facility] and allow  them to administer to her each day.  She will follow-up with Dr. Debara Pickett in about 3 to 4 months for ongoing assessment.  Heart rate is slightly elevated initially but on auscultation heart rate was 72 bpm.  No changes in metoprolol and digoxin.  Will likely need to have a be met on follow-up to assess kidney function along with a digoxin level.  2.  Hypertension: Currently well controlled on medication regimen.  She has not fallen, uses a walker for ambulation, and undergoing occupational therapy with no significant hypotension related to position change.  3.  History of TIA: No residual limitations seen with exception of unsteadiness on her feet due to frailty.  Continues to have ongoing management of carotid artery disease without any further surgical intervention.  4.  Hypercholesterolemia: Remains on atorvastatin 20 mg daily.  Her labs are followed by PCP with a goal of LDL less than 70.  Review of most recent labs November 2022, total cholesterol 115, LDL 49 HDL 48.  Current medicines are reviewed at length with the patient today.  I have spent 30 min's  dedicated to the care of this patient on the date of this encounter to include pre-visit review of records, assessment, management and diagnostic testing,with shared decision making. Signed, Phill Myron. West Pugh, ANP, [place]   01/19/2022 4:48 PM      Office [phone] Fax [phone]  Notice: This dictation was prepared with Dragon dictation along with smaller phrase technology. Any transcriptional errors that result from this process are unintentional and may not be corrected upon review.

## 2022-01-19 ENCOUNTER — Encounter: Payer: Self-pay | Admitting: Adult Health

## 2022-01-19 ENCOUNTER — Ambulatory Visit: Payer: Medicare Other | Attending: Adult Health | Admitting: Adult Health

## 2022-01-19 VITALS — BP 110/70 | HR 82 | Ht 62.0 in | Wt 124.0 lb

## 2022-01-19 DIAGNOSIS — I4821 Permanent atrial fibrillation: Secondary | ICD-10-CM | POA: Diagnosis not present

## 2022-01-19 DIAGNOSIS — I6529 Occlusion and stenosis of unspecified carotid artery: Secondary | ICD-10-CM | POA: Diagnosis not present

## 2022-01-19 DIAGNOSIS — E7800 Pure hypercholesterolemia, unspecified: Secondary | ICD-10-CM | POA: Diagnosis not present

## 2022-01-19 DIAGNOSIS — G459 Transient cerebral ischemic attack, unspecified: Secondary | ICD-10-CM | POA: Diagnosis not present

## 2022-01-19 DIAGNOSIS — S0003XD Contusion of scalp, subsequent encounter: Secondary | ICD-10-CM | POA: Diagnosis not present

## 2022-01-19 DIAGNOSIS — I10 Essential (primary) hypertension: Secondary | ICD-10-CM | POA: Diagnosis not present

## 2022-01-19 MED ORDER — ASPIRIN 81 MG PO TBEC: 81.0000 mg | DELAYED_RELEASE_TABLET | Freq: Every day | ORAL | 3 refills | Status: DC

## 2022-01-19 NOTE — Patient Instructions (Signed)
Medication Instructions:  Start Aspirin 81 mg ( Take 1 Tablet Daily). *If you need a refill on your cardiac medications before your next appointment, please call your pharmacy*   Lab Work: No Labs If you have labs (blood work) drawn today and your tests are completely normal, you will receive your results only by: [place] (if you have MyChart) OR A paper copy in the mail If you have any lab test that is abnormal or we need to change your treatment, we will call you to review the results.   Testing/Procedures: No Testing    Follow-Up: At [facility], you and your health needs are our priority.  As part of our continuing mission to provide you with exceptional heart care, we have created designated Provider Care Teams.  These Care Teams include your primary Cardiologist (physician) and Advanced Practice Providers (APPs -  Physician Assistants and Nurse Practitioners) who all work together to provide you with the care you need, when you need it.  We recommend signing up for the patient portal called "MyChart".  Sign up information is provided on this After Visit Summary.  MyChart is used to connect with patients for Virtual Visits (Telemedicine).  Patients are able to view lab/test results, encounter notes, upcoming appointments, etc.  Non-urgent messages can be sent to your provider as well.   To learn more about what you can do with MyChart, go to [email or web address].    Your next appointment:   3 month(s)  The format for your next appointment:   In Person  Provider:   Pixie Casino, MD

## 2022-01-22 DIAGNOSIS — R278 Other lack of coordination: Secondary | ICD-10-CM | POA: Diagnosis not present

## 2022-01-22 DIAGNOSIS — R296 Repeated falls: Secondary | ICD-10-CM | POA: Diagnosis not present

## 2022-01-22 DIAGNOSIS — M6289 Other specified disorders of muscle: Secondary | ICD-10-CM | POA: Diagnosis not present

## 2022-01-22 DIAGNOSIS — G3184 Mild cognitive impairment, so stated: Secondary | ICD-10-CM | POA: Diagnosis not present

## 2022-01-22 DIAGNOSIS — N3000 Acute cystitis without hematuria: Secondary | ICD-10-CM | POA: Diagnosis not present

## 2022-01-24 DIAGNOSIS — R278 Other lack of coordination: Secondary | ICD-10-CM | POA: Diagnosis not present

## 2022-01-24 DIAGNOSIS — N3000 Acute cystitis without hematuria: Secondary | ICD-10-CM | POA: Diagnosis not present

## 2022-01-24 DIAGNOSIS — M6289 Other specified disorders of muscle: Secondary | ICD-10-CM | POA: Diagnosis not present

## 2022-01-24 DIAGNOSIS — G3184 Mild cognitive impairment, so stated: Secondary | ICD-10-CM | POA: Diagnosis not present

## 2022-01-24 DIAGNOSIS — R296 Repeated falls: Secondary | ICD-10-CM | POA: Diagnosis not present

## 2022-01-29 DIAGNOSIS — R278 Other lack of coordination: Secondary | ICD-10-CM | POA: Diagnosis not present

## 2022-01-29 DIAGNOSIS — R296 Repeated falls: Secondary | ICD-10-CM | POA: Diagnosis not present

## 2022-01-29 DIAGNOSIS — G3184 Mild cognitive impairment, so stated: Secondary | ICD-10-CM | POA: Diagnosis not present

## 2022-01-29 DIAGNOSIS — N3000 Acute cystitis without hematuria: Secondary | ICD-10-CM | POA: Diagnosis not present

## 2022-01-29 DIAGNOSIS — M24871 Other specific joint derangements of right ankle, not elsewhere classified: Secondary | ICD-10-CM | POA: Diagnosis not present

## 2022-01-29 DIAGNOSIS — Z23 Encounter for immunization: Secondary | ICD-10-CM | POA: Diagnosis not present

## 2022-01-29 DIAGNOSIS — M6289 Other specified disorders of muscle: Secondary | ICD-10-CM | POA: Diagnosis not present

## 2022-01-30 ENCOUNTER — Encounter (HOSPITAL_BASED_OUTPATIENT_CLINIC_OR_DEPARTMENT_OTHER): Payer: Self-pay

## 2022-01-30 ENCOUNTER — Emergency Department (HOSPITAL_BASED_OUTPATIENT_CLINIC_OR_DEPARTMENT_OTHER): Payer: Medicare Other | Admitting: Radiology

## 2022-01-30 ENCOUNTER — Emergency Department (HOSPITAL_BASED_OUTPATIENT_CLINIC_OR_DEPARTMENT_OTHER): Payer: Medicare Other

## 2022-01-30 ENCOUNTER — Emergency Department (HOSPITAL_BASED_OUTPATIENT_CLINIC_OR_DEPARTMENT_OTHER)
Admission: EM | Admit: 2022-01-30 | Discharge: 2022-01-31 | Disposition: A | Discharge status: Home / Self Care | Payer: Medicare Other | Attending: Emergency Medicine | Admitting: Emergency Medicine

## 2022-01-30 DIAGNOSIS — W19XXXA Unspecified fall, initial encounter: Secondary | ICD-10-CM | POA: Insufficient documentation

## 2022-01-30 DIAGNOSIS — Z85038 Personal history of other malignant neoplasm of large intestine: Secondary | ICD-10-CM | POA: Insufficient documentation

## 2022-01-30 DIAGNOSIS — M8580 Other specified disorders of bone density and structure, unspecified site: Secondary | ICD-10-CM | POA: Diagnosis not present

## 2022-01-30 DIAGNOSIS — E039 Hypothyroidism, unspecified: Secondary | ICD-10-CM | POA: Insufficient documentation

## 2022-01-30 DIAGNOSIS — Z7722 Contact with and (suspected) exposure to environmental tobacco smoke (acute) (chronic): Secondary | ICD-10-CM | POA: Diagnosis not present

## 2022-01-30 DIAGNOSIS — M0579 Rheumatoid arthritis with rheumatoid factor of multiple sites without organ or systems involvement: Secondary | ICD-10-CM | POA: Diagnosis not present

## 2022-01-30 DIAGNOSIS — S8291XA Unspecified fracture of right lower leg, initial encounter for closed fracture: Secondary | ICD-10-CM | POA: Diagnosis not present

## 2022-01-30 DIAGNOSIS — S8991XA Unspecified injury of right lower leg, initial encounter: Secondary | ICD-10-CM | POA: Diagnosis present

## 2022-01-30 DIAGNOSIS — S82891A Other fracture of right lower leg, initial encounter for closed fracture: Secondary | ICD-10-CM

## 2022-01-30 DIAGNOSIS — M5136 Other intervertebral disc degeneration, lumbar region: Secondary | ICD-10-CM | POA: Diagnosis not present

## 2022-01-30 DIAGNOSIS — Z8673 Personal history of transient ischemic attack (TIA), and cerebral infarction without residual deficits: Secondary | ICD-10-CM | POA: Insufficient documentation

## 2022-01-30 DIAGNOSIS — Z6822 Body mass index (BMI) 22.0-22.9, adult: Secondary | ICD-10-CM | POA: Diagnosis not present

## 2022-01-30 DIAGNOSIS — M1991 Primary osteoarthritis, unspecified site: Secondary | ICD-10-CM | POA: Diagnosis not present

## 2022-01-30 DIAGNOSIS — I10 Essential (primary) hypertension: Secondary | ICD-10-CM | POA: Diagnosis not present

## 2022-01-30 DIAGNOSIS — Z043 Encounter for examination and observation following other accident: Secondary | ICD-10-CM | POA: Diagnosis not present

## 2022-01-30 DIAGNOSIS — R9431 Abnormal electrocardiogram [ECG] [EKG]: Secondary | ICD-10-CM | POA: Diagnosis not present

## 2022-01-30 DIAGNOSIS — Z79899 Other long term (current) drug therapy: Secondary | ICD-10-CM | POA: Diagnosis not present

## 2022-01-30 LAB — CBC WITH DIFFERENTIAL/PLATELET
Abs Immature Granulocytes: 0.02 10*3/uL (ref 0.00–0.07)
Basophils Absolute: 0.1 10*3/uL (ref 0.0–0.1)
Basophils Relative: 1 %
Eosinophils Absolute: 0.1 10*3/uL (ref 0.0–0.5)
Eosinophils Relative: 1 %
HCT: 33.8 % — ABNORMAL LOW (ref 36.0–46.0)
Hemoglobin: 11.2 g/dL — ABNORMAL LOW (ref 12.0–15.0)
Immature Granulocytes: 0 %
Lymphocytes Relative: 15 %
Lymphs Abs: 1.1 10*3/uL (ref 0.7–4.0)
MCH: 35.3 pg — ABNORMAL HIGH (ref 26.0–34.0)
MCHC: 33.1 g/dL (ref 30.0–36.0)
MCV: 106.6 fL — ABNORMAL HIGH (ref 80.0–100.0)
Monocytes Absolute: 1.3 10*3/uL — ABNORMAL HIGH (ref 0.1–1.0)
Monocytes Relative: 18 %
Neutro Abs: 4.9 10*3/uL (ref 1.7–7.7)
Neutrophils Relative %: 65 %
Platelets: 194 10*3/uL (ref 150–400)
RBC: 3.17 MIL/uL — ABNORMAL LOW (ref 3.87–5.11)
RDW: 14.5 % (ref 11.5–15.5)
WBC: 7.6 10*3/uL (ref 4.0–10.5)
nRBC: 0 % (ref 0.0–0.2)

## 2022-01-30 LAB — MAGNESIUM: Magnesium: 1.9 mg/dL (ref 1.7–2.4)

## 2022-01-30 LAB — BASIC METABOLIC PANEL
Anion gap: 10 (ref 5–15)
BUN: 22 mg/dL (ref 8–23)
CO2: 22 mmol/L (ref 22–32)
Calcium: 9.4 mg/dL (ref 8.9–10.3)
Chloride: 100 mmol/L (ref 98–111)
Creatinine, Ser: 0.88 mg/dL (ref 0.44–1.00)
GFR, Estimated: >60 mL/min (ref >60)
Glucose, Bld: 116 mg/dL — ABNORMAL HIGH (ref 70–99)
Potassium: 4.3 mmol/L (ref 3.5–5.1)
Sodium: 132 mmol/L — ABNORMAL LOW (ref 135–145)

## 2022-01-30 MED ORDER — HYDROCODONE-ACETAMINOPHEN 5-325 MG PO TABS
1.0000 | ORAL_TABLET | Freq: Once | ORAL | Status: AC
  Administered 2022-01-31: 1 via ORAL
  Filled 2022-01-30 (×2): qty 1

## 2022-01-30 NOTE — ED Triage Notes (Signed)
Pt c/o R ankle pain after mechanical fall when getting out of the car approx 3p. Pt reports she is unsure if she is able to walk on it, states she hasn't tried "because of how it feels." Slight swelling, no obvious deformity. Denies LOC, denies hitting head. CNS intact distal to injury.

## 2022-01-30 NOTE — ED Provider Triage Note (Signed)
Emergency Medicine Provider Triage Evaluation Note  Kathy Velez , a 88 y.o. female  was evaluated in triage.  Pt complains of fall and right ankle pain while getting in car. Says she thinks it was mechanical but has had dizzy spells. Also hx of afib, on doac, did not hit head. Daughter also reports chronically low hgb  Review of Systems  Positive:  Negative:   Physical Exam  BP (!) 156/88   Pulse (!) 102   Temp 98.7 F (37.1 C) (Oral)   Resp 16   Ht '5\' 2"'$  (1.575 m)   Wt 56.7 kg   SpO2 96%   BMI 22.86 kg/m  Gen:   Awake, no distress   Resp:  Normal effort  MSK:   Moves extremities without difficulty  Other:  Afib normal rate, tenderness over medial ankle  Medical Decision Making  Medically screening exam initiated at 6:02 PM.  Appropriate orders placed.  [name] was informed that the remainder of the evaluation will be completed by another provider, this initial triage assessment does not replace that evaluation, and the importance of remaining in the ED until their evaluation is complete.     Rhae Hammock, PA-C 01/30/22 1805

## 2022-01-31 ENCOUNTER — Other Ambulatory Visit: Payer: Self-pay | Admitting: Orthopedic Surgery

## 2022-01-31 DIAGNOSIS — R278 Other lack of coordination: Secondary | ICD-10-CM | POA: Diagnosis not present

## 2022-01-31 DIAGNOSIS — M6289 Other specified disorders of muscle: Secondary | ICD-10-CM | POA: Diagnosis not present

## 2022-01-31 DIAGNOSIS — Z9181 History of falling: Secondary | ICD-10-CM | POA: Diagnosis not present

## 2022-01-31 DIAGNOSIS — R2689 Other abnormalities of gait and mobility: Secondary | ICD-10-CM | POA: Diagnosis not present

## 2022-01-31 DIAGNOSIS — N3000 Acute cystitis without hematuria: Secondary | ICD-10-CM | POA: Diagnosis not present

## 2022-01-31 DIAGNOSIS — R2681 Unsteadiness on feet: Secondary | ICD-10-CM | POA: Diagnosis not present

## 2022-01-31 DIAGNOSIS — M25571 Pain in right ankle and joints of right foot: Secondary | ICD-10-CM

## 2022-01-31 DIAGNOSIS — S8291XA Unspecified fracture of right lower leg, initial encounter for closed fracture: Secondary | ICD-10-CM | POA: Diagnosis not present

## 2022-01-31 DIAGNOSIS — M24871 Other specific joint derangements of right ankle, not elsewhere classified: Secondary | ICD-10-CM | POA: Diagnosis not present

## 2022-01-31 DIAGNOSIS — R296 Repeated falls: Secondary | ICD-10-CM | POA: Diagnosis not present

## 2022-01-31 DIAGNOSIS — G3184 Mild cognitive impairment, so stated: Secondary | ICD-10-CM | POA: Diagnosis not present

## 2022-01-31 LAB — URINALYSIS, ROUTINE W REFLEX MICROSCOPIC
Bilirubin Urine: NEGATIVE
Glucose, UA: NEGATIVE mg/dL
Hgb urine dipstick: NEGATIVE
Ketones, ur: NEGATIVE mg/dL
Nitrite: NEGATIVE
Protein, ur: NEGATIVE mg/dL
Specific Gravity, Urine: 1.017 (ref 1.005–1.030)
pH: 6 (ref 5.0–8.0)

## 2022-01-31 MED ORDER — TRAMADOL HCL 50 MG PO TABS: 25.0000 mg | ORAL_TABLET | Freq: Two times a day (BID) | ORAL | 0 refills | Status: AC | PRN

## 2022-01-31 MED ORDER — HYDROCODONE-ACETAMINOPHEN 5-325 MG PO TABS: 1.0000 | ORAL_TABLET | ORAL | 0 refills | Status: DC | PRN

## 2022-01-31 NOTE — ED Notes (Signed)
Pt/family agreeable with d/c plan as discussed by provider- this nurse has verbally reinforced d/c instructions and provided pt daughter with written copy; pt/daughter acknowledge verbal understanding and deny any addl questions, concerns, needs- pt assisted into w/c by 2 staff  nurses (this nurse and 2nd staff nurse) and escorted to vehicle by this nurse - assisted into passenger seat by this nurse

## 2022-01-31 NOTE — Discharge Instructions (Addendum)
You were evaluated in the Emergency Department and after careful evaluation, we did not find any emergent condition requiring admission or further testing in the hospital.  Your exam/testing today is overall reassuring.  Your x-ray has shown a broken ankle.  Knee x-ray did not show any broken bones.  Important that you follow-up with the orthopedic specialists for continued care.  Please return to the Emergency Department if you experience any worsening of your condition.   Thank you for allowing [place] to be a part of your care.

## 2022-01-31 NOTE — ED Provider Notes (Signed)
[facility] Emergency Department Provider Note MRN:  [id number]  Arrival date & time: 01/31/22     Chief Complaint   Ankle Pain and Fall   History of Present Illness   Kathy Velez is a 88 y.o. year-old female with a history of A-fib presenting to the ED with chief complaint of ankle pain and fall.  Tripped and fell today, having continued right ankle pain and swelling.  Also some mild right knee pain.  Denies head trauma, no loss consciousness, no chest pain or shortness of breath, no abdominal pain, no neck or back pain.  Review of Systems  A thorough review of systems was obtained and all systems are negative except as noted in the HPI and PMH.   Patient's Health History    Past Medical History:  Diagnosis Date   Allergy    seasonal   Anemia    Anxiety    Arthritis    Back   Atrial fibrillation ([place])    Cancer ([place]) 2006   Colon.  Basal Cell Skin cancer- right arm   Cataract    removed bilateral   Colon cancer (HCC)    Constipation due to pain medication therapy    after heart surgery   Dysrhythmia    PAF   GERD (gastroesophageal reflux disease)    Heart murmur    Hyperlipidemia    Hypertension    Hypothyroidism    RA (rheumatoid arthritis) ([place])    Restless leg    Seizures ([place])    after Heart Surgey   Stroke ([place])    TIA- found by neurologist after     Past Surgical History:  Procedure Laterality Date   ABDOMINAL HYSTERECTOMY  1970   Partial    COLON RESECTION  2006   cancer   COLON SURGERY     COLONOSCOPY     EYE SURGERY Bilateral    Cataract   MAXIMUM ACCESS (MAS)POSTERIOR LUMBAR INTERBODY FUSION (PLIF) 2 LEVEL N/A 04/12/2015   Procedure: Lumbar Three-Five Decompression, Pedicle Screw Fixation, and Posteriolateral Arthrodesis;  Surgeon: Erline Levine, MD;  Location: [facility];  Service: Neurosurgery;  Laterality: N/A;  L3-4 L4-5 Maximum access posterior lumbar fusion, possible interbodies and resection of synovial cyst at  L4-5   MITRAL VALVE REPAIR  01/20/2013   Gore-tex cords to P1, P2, and P3. Magic suture to posterior medial commisure, #30 Physio 1 ring. Done in [place]   TONSILLECTOMY     about [place]  01/20/2013   #28 TriAd ring done in [place]    Family History  Problem Relation Age of Onset   Lung disease Mother 59   Heart disease Father 43   Colon polyps Daughter    Alcohol abuse Son    Colon cancer Neg Hx    Stomach cancer Neg Hx    Rectal cancer Neg Hx    Crohn's disease Neg Hx    Esophageal cancer Neg Hx     Social History   Socioeconomic History   Marital status: Widowed    Spouse name: Not on file   Number of children: Not on file   Years of education: Not on file   Highest education level: Not on file  Occupational History   Occupation: elementary school teacher    Comment: retired   Tobacco Use   Smoking status: Never    Passive exposure: Past ("mother smoked")   Smokeless tobacco: Never  Vaping Use   Vaping Use: Never  used  Substance and Sexual Activity   Alcohol use: Yes    Alcohol/week: 1.0 standard drink of alcohol    Types: 1 Glasses of wine per week    Comment: 1-2 per week   Drug use: No   Sexual activity: Not Currently  Other Topics Concern   Not on file  Social History Narrative   Social History      Diet? Healthy- low salt, sugar, fat      Do you drink/eat things with caffeine? On occasion      Marital status?        widow                        What year were you married? 1958      Do you live in a house, apartment, assisted living, condo, trailer, etc.? apartment      Is it one or more stories? one      How many persons live in your home? one      Do you have any pets in your home? (please list) no      Highest level of education completed? 4 year college      Current or past profession: Statistician      Do you exercise?             yes                         Type & how often? Classes- senior retirement  community/ some walking      Advanced Directives      Do you have a living will?      Do you have a DNR form?                                  If not, do you want to discuss one?      Do you have signed POA/HPOA for forms?       Functional Status Completed by: Daughter, Di Kindle      Do you have difficulty bathing or dressing yourself? no      Do you have difficulty preparing food or eating? no      Do you have difficulty managing your medications? no      Do you have difficulty managing your finances? no      Do you have difficulty affording your medications? no   Right handed   At [facility]   Social Determinants of Health   Financial Resource Strain: Low Risk  (01/28/2018)   Overall Financial Resource Strain (CARDIA)    Difficulty of Paying Living Expenses: Not hard at all  Food Insecurity: No Food Insecurity (12/21/2021)   Hunger Vital Sign    Worried About Running Out of Food in the Last Year: Never true    Ran Out of Food in the Last Year: Never true  Transportation Needs: No Transportation Needs (12/21/2021)   PRAPARE - Hydrologist (Medical): No    Lack of Transportation (Non-Medical): No  Physical Activity: Sufficiently Active (01/28/2018)   Exercise Vital Sign    Days of Exercise per Week: 6 days    Minutes of Exercise per Session: 30 min  Stress: No Stress Concern Present (01/28/2018)   [place]    Feeling of Stress :  Only a little  Social Connections: Moderately Isolated (01/28/2018)   Social Connection and Isolation Panel [NHANES]    Frequency of Communication with Friends and Family: More than three times a week    Frequency of Social Gatherings with Friends and Family: More than three times a week    Attends Religious Services: Never    Marine scientist or Organizations: No    Attends Archivist Meetings: Never    Marital Status: Widowed   Intimate Partner Violence: Not At Risk (12/21/2021)   Humiliation, Afraid, Rape, and Kick questionnaire    Fear of Current or Ex-Partner: No    Emotionally Abused: No    Physically Abused: No    Sexually Abused: No     Physical Exam   Vitals:   01/30/22 2330 01/30/22 2356  BP: (!) 184/102   Pulse: 82   Resp: 16   Temp:  98.6 F (37 C)  SpO2: 94%     CONSTITUTIONAL: Well-appearing, NAD NEURO/PSYCH:  Alert and oriented x 3, no focal deficits EYES:  eyes equal and reactive ENT/NECK:  no LAD, no JVD CARDIO: Regular rate, well-perfused, normal S1 and S2 PULM:  CTAB no wheezing or rhonchi GI/GU:  non-distended, non-tender MSK/SPINE:  No gross deformities, no edema SKIN:  no rash, bruising and tenderness to the right ankle   *Additional and/or pertinent findings included in MDM below  Diagnostic and Interventional Summary    EKG Interpretation  Date/Time:  Tuesday January 30 2022 18:10:15 EST Ventricular Rate:  90 PR Interval:    QRS Duration: 82 QT Interval:  344 QTC Calculation: 420 R Axis:   1 Text Interpretation: Atrial fibrillation Nonspecific ST and T wave abnormality Abnormal ECG When compared with ECG of 20-Dec-2021 22:28, PREVIOUS ECG IS PRESENT Confirmed by Gerlene Fee [phone]) on 01/31/2022 12:11:39 AM       Labs Reviewed  CBC WITH DIFFERENTIAL/PLATELET - Abnormal; Notable for the following components:      Result Value   RBC 3.17 (*)    Hemoglobin 11.2 (*)    HCT 33.8 (*)    MCV 106.6 (*)    MCH 35.3 (*)    Monocytes Absolute 1.3 (*)    All other components within normal limits  BASIC METABOLIC PANEL - Abnormal; Notable for the following components:   Sodium 132 (*)    Glucose, Bld 116 (*)    All other components within normal limits  MAGNESIUM  URINALYSIS, ROUTINE W REFLEX MICROSCOPIC    DG Knee Complete 4 Views Right  Final Result    DG Ankle Complete Right  Final Result      Medications  HYDROcodone-acetaminophen (NORCO/VICODIN) 5-325  MG per tablet 1 tablet (1 tablet Oral Patient Refused/Not Given 01/31/22 0001)     Procedures  /  Critical Care Procedures  ED Course and Medical Decision Making  Initial Impression and Ddx Suspect fracture versus sprain.  No evidence of head trauma, denies any head trauma and so no indication for CNS imaging at this time.  Past medical/surgical history that increases complexity of ED encounter: A-fib/flutter  Interpretation of Diagnostics I personally reviewed the x-ray of the ankle and my interpretation is as follows: Possible bi or trimalleolar fracture    Patient Reassessment and Ultimate Disposition/Management     Patient appropriate for splint and follow-up with orthopedics.  Patient management required discussion with the following services or consulting groups:  None  Complexity of Problems Addressed Acute illness or injury that poses threat of  life of bodily function  Additional Data Reviewed and Analyzed Further history obtained from: Further history from spouse/family member  Additional Factors Impacting ED Encounter Risk None  Barth Kirks. [place] Small, [place] [email or web address]  Final Clinical Impressions(s) / ED Diagnoses     ICD-10-CM   1. Closed fracture of right ankle, initial encounter  S82.891A       ED Discharge Orders          Ordered    HYDROcodone-acetaminophen (NORCO/VICODIN) 5-325 MG tablet  Every 4 hours PRN        01/31/22 0012             Discharge Instructions Discussed with and Provided to Patient:    Discharge Instructions      You were evaluated in the Emergency Department and after careful evaluation, we did not find any emergent condition requiring admission or further testing in the hospital.  Your exam/testing today is overall reassuring.  Your x-ray has shown a broken ankle.  Knee x-ray did not show any broken bones.  Important that you follow-up with the orthopedic  specialists for continued care.  Please return to the Emergency Department if you experience any worsening of your condition.   Thank you for allowing [place] to be a part of your care.      Maudie Flakes, MD 01/31/22 [phone]

## 2022-02-01 ENCOUNTER — Non-Acute Institutional Stay (SKILLED_NURSING_FACILITY): Payer: Medicare Other | Admitting: Adult Health

## 2022-02-01 DIAGNOSIS — N3000 Acute cystitis without hematuria: Secondary | ICD-10-CM | POA: Diagnosis not present

## 2022-02-01 DIAGNOSIS — R296 Repeated falls: Secondary | ICD-10-CM | POA: Diagnosis not present

## 2022-02-01 DIAGNOSIS — D509 Iron deficiency anemia, unspecified: Secondary | ICD-10-CM | POA: Diagnosis not present

## 2022-02-01 DIAGNOSIS — I4821 Permanent atrial fibrillation: Secondary | ICD-10-CM

## 2022-02-01 DIAGNOSIS — E039 Hypothyroidism, unspecified: Secondary | ICD-10-CM

## 2022-02-01 DIAGNOSIS — I10 Essential (primary) hypertension: Secondary | ICD-10-CM | POA: Diagnosis not present

## 2022-02-01 DIAGNOSIS — J849 Interstitial pulmonary disease, unspecified: Secondary | ICD-10-CM

## 2022-02-01 DIAGNOSIS — G3184 Mild cognitive impairment, so stated: Secondary | ICD-10-CM | POA: Diagnosis not present

## 2022-02-01 DIAGNOSIS — R2689 Other abnormalities of gait and mobility: Secondary | ICD-10-CM | POA: Diagnosis not present

## 2022-02-01 DIAGNOSIS — M06049 Rheumatoid arthritis without rheumatoid factor, unspecified hand: Secondary | ICD-10-CM | POA: Diagnosis not present

## 2022-02-01 DIAGNOSIS — R2681 Unsteadiness on feet: Secondary | ICD-10-CM | POA: Diagnosis not present

## 2022-02-01 DIAGNOSIS — S82891A Other fracture of right lower leg, initial encounter for closed fracture: Secondary | ICD-10-CM | POA: Diagnosis not present

## 2022-02-01 DIAGNOSIS — E871 Hypo-osmolality and hyponatremia: Secondary | ICD-10-CM

## 2022-02-01 DIAGNOSIS — M6289 Other specified disorders of muscle: Secondary | ICD-10-CM | POA: Diagnosis not present

## 2022-02-01 DIAGNOSIS — Z9181 History of falling: Secondary | ICD-10-CM | POA: Diagnosis not present

## 2022-02-01 DIAGNOSIS — R278 Other lack of coordination: Secondary | ICD-10-CM | POA: Diagnosis not present

## 2022-02-01 DIAGNOSIS — M24871 Other specific joint derangements of right ankle, not elsewhere classified: Secondary | ICD-10-CM | POA: Diagnosis not present

## 2022-02-02 ENCOUNTER — Encounter: Payer: Self-pay | Admitting: Adult Health

## 2022-02-02 DIAGNOSIS — M24871 Other specific joint derangements of right ankle, not elsewhere classified: Secondary | ICD-10-CM | POA: Diagnosis not present

## 2022-02-02 DIAGNOSIS — N3000 Acute cystitis without hematuria: Secondary | ICD-10-CM | POA: Diagnosis not present

## 2022-02-02 DIAGNOSIS — S82841A Displaced bimalleolar fracture of right lower leg, initial encounter for closed fracture: Secondary | ICD-10-CM | POA: Insufficient documentation

## 2022-02-02 DIAGNOSIS — R2681 Unsteadiness on feet: Secondary | ICD-10-CM | POA: Diagnosis not present

## 2022-02-02 DIAGNOSIS — Z9181 History of falling: Secondary | ICD-10-CM | POA: Diagnosis not present

## 2022-02-02 DIAGNOSIS — M6289 Other specified disorders of muscle: Secondary | ICD-10-CM | POA: Diagnosis not present

## 2022-02-02 DIAGNOSIS — G3184 Mild cognitive impairment, so stated: Secondary | ICD-10-CM | POA: Diagnosis not present

## 2022-02-02 DIAGNOSIS — R2689 Other abnormalities of gait and mobility: Secondary | ICD-10-CM | POA: Diagnosis not present

## 2022-02-02 DIAGNOSIS — R296 Repeated falls: Secondary | ICD-10-CM | POA: Diagnosis not present

## 2022-02-02 DIAGNOSIS — R278 Other lack of coordination: Secondary | ICD-10-CM | POA: Diagnosis not present

## 2022-02-02 NOTE — Progress Notes (Signed)
Location:  Occupational psychologist of Service:  SNF (31) Provider:   Cindi Carbon, [place] [phone]   Virgie Dad, MD  Patient Care Team: Virgie Dad, MD as PCP - General (Internal Medicine) Debara Pickett Nadean Corwin, MD as PCP - Cardiology (Cardiology) Pieter Partridge, DO as Consulting Physician (Neurology)  Extended Emergency Contact Information Primary Emergency Contact: [name] Address: [place], [place] [address] Johnnette Litter of [place] Phone: [phone] Mobile Phone: [phone] Relation: Daughter Secondary Emergency Contact: Eula Listen Address: [address]          [place], [place] [address] Johnnette Litter of [place] Mobile Phone: [phone] Relation: Son  Code Status:  DNR Goals of care: Advanced Directive information    01/04/2022   10:00 AM  Advanced Directives  Does Patient Have a Medical Advance Directive? Yes  Type of Paramedic of [place];Living will  Copy of [place] in Chart? Yes - validated most recent copy scanned in chart (See row information)     Chief Complaint  Patient presents with   Acute Visit    F/u fall ER visit     HPI:  Pt is a 88 y.o. female seen today for a f/u after an ER visit due to a mechanical fall with ankle pain on 01/30/22.   PMH: hx of colon ca in 2006 with recurrence. Noted by colonoscopy 02/22/21, bx showed well differentiated adenocarcinoma. CT in Jan of 2023 showed no residual disease. Had 4 cycles of pembrolizumab. Colonoscopy 09/20/21 showed no residual mass. She is holding off on further treatment due to side effects.  PMH also significant for ILD, anemia, RA, memory loss, HTN, afib, CVA, depression, constipation HLD    Xray of the right ankle showed acute displaced distal fibular fracture. Suspect non displaced medial malleolar fracture and possible small displaced posterior malleolar fracture. She was  placed on a cast and [facility]. She is non weight bearing right now. Will see ortho in am. She reports only minimal pain and is using tramadol. Nurse reports she is weak and seems slower than usual. She did not hit her head. Prior to her fall she lived in [place] and was ambulatory with a walker. She has had several falls and had a hematoma with arterial bleed with a non healing wound in Sept/Oct which has now healed. She is off DOAC due to this reason, only on asa.   Nurse feels like she is weak and slower to talk. Likely did not sleep well. No focal deficit.    Past Medical History:  Diagnosis Date   Allergy    seasonal   Anemia    Anxiety    Arthritis    Back   Atrial fibrillation ([place])    Cancer ([place]) 2006   Colon.  Basal Cell Skin cancer- right arm   Cataract    removed bilateral   Colon cancer (HCC)    Constipation due to pain medication therapy    after heart surgery   Dysrhythmia    PAF   GERD (gastroesophageal reflux disease)    Heart murmur    Hyperlipidemia    Hypertension    Hypothyroidism    RA (rheumatoid arthritis) (HCC)    Restless leg    Seizures ([place])    after Heart Surgey   Stroke ([place])    TIA- found by neurologist after  Past Surgical History:  Procedure Laterality Date   ABDOMINAL HYSTERECTOMY  1970   Partial    COLON RESECTION  2006   cancer   COLON SURGERY     COLONOSCOPY     EYE SURGERY Bilateral    Cataract   MAXIMUM ACCESS (MAS)POSTERIOR LUMBAR INTERBODY FUSION (PLIF) 2 LEVEL N/A 04/12/2015   Procedure: Lumbar Three-Five Decompression, Pedicle Screw Fixation, and Posteriolateral Arthrodesis;  Surgeon: Erline Levine, MD;  Location: [facility];  Service: Neurosurgery;  Laterality: N/A;  L3-4 L4-5 Maximum access posterior lumbar fusion, possible interbodies and resection of synovial cyst at L4-5   MITRAL VALVE REPAIR  01/20/2013   Gore-tex cords to P1, P2, and P3. Magic suture to posterior medial commisure, #30 Physio 1 ring. Done in  [place]   TONSILLECTOMY     about [place]  01/20/2013   #28 TriAd ring done in [place]    Allergies  Allergen Reactions   Claritin [Loratadine] Other (See Comments)    Listed on MAR Unknown reaction   Codeine Other (See Comments)    "just don't take it well"   Gabapentin Other (See Comments)    Dizziness    Molds & Smuts     dust   Pollen Extract Swelling    Grass   Bee Venom Swelling and Rash   Wasp Venom Swelling and Rash    Outpatient Encounter Medications as of 02/01/2022  Medication Sig   aspirin EC 81 MG tablet Take 1 tablet (81 mg total) by mouth daily. Swallow whole.   atorvastatin (LIPITOR) 20 MG tablet TAKE 1 TABLET DAILY   Cholecalciferol (VITAMIN D) 50 MCG (2000 UT) CAPS Take 2,000 Units by mouth in the morning.   digoxin (LANOXIN) 0.125 MG tablet TAKE 1 TABLET DAILY   ferrous sulfate 325 (65 FE) MG tablet Take 325 mg by mouth daily with breakfast.   HYDROcodone-acetaminophen (NORCO/VICODIN) 5-325 MG tablet Take 1 tablet by mouth every 4 (four) hours as needed.   leflunomide (ARAVA) 20 MG tablet Take 20 mg by mouth in the morning.   levothyroxine (SYNTHROID) 100 MCG tablet Take 1 tablet (100 mcg total) by mouth daily before breakfast.   metoprolol succinate (TOPROL-XL) 50 MG 24 hr tablet TAKE 1 TABLET DAILY   mirtazapine (REMERON) 15 MG tablet TAKE 1 TABLET AT BEDTIME   Multiple Vitamin (MULTIVITAMIN) tablet Take 1 tablet by mouth in the morning.   Multiple Vitamins-Minerals (PRESERVISION AREDS 2) CAPS Take 1 capsule by mouth 2 (two) times daily.   omeprazole (PRILOSEC) 40 MG capsule TAKE 1 CAPSULE DAILY   sodium chloride 1 g tablet Take 1 g by mouth in the morning.   traMADol (ULTRAM) 50 MG tablet Take 0.5 tablets (25 mg total) by mouth 2 (two) times daily as needed for up to 14 days.   valsartan (DIOVAN) 80 MG tablet Take 0.5 tablets (40 mg total) by mouth daily.   Vibegron (GEMTESA) 75 MG TABS Take 32.5 mg by mouth at bedtime.   vitamin  B-12 (CYANOCOBALAMIN) 1000 MCG tablet Take 1,000 mcg by mouth in the morning.   No facility-administered encounter medications on file as of 02/01/2022.    Review of Systems  Immunization History  Administered Date(s) Administered   Fluad Quad(high Dose 65+) 01/05/2019, 12/07/2021   H1N1 04/07/2008   Influenza Whole 12/29/2008, 12/22/2009   Influenza, High Dose Seasonal PF 01/15/2012, 12/23/2014, 01/22/2020   Influenza,inj,Quad PF,6+ Mos 01/22/2014, 12/10/2016, 01/17/2018   Influenza,trivalent, recombinat, inj, PF 01/05/2016  Influenza-Unspecified 04/07/2008, 12/15/2014, 01/13/2021   Moderna Sars-Covid-2 Vaccination 04/07/2019, 05/05/2019, 02/09/2020   PFIZER(Purple Top)SARS-COV-2 Vaccination 01/06/2021   PPD Test 07/17/2010, 04/19/2015, 04/25/2015   Pneumococcal Conjugate-13 11/10/2013   Pneumococcal Polysaccharide-23 12/27/2010, 12/05/2016   Tdap 04/15/2017   Zoster Recombinat (Shingrix) 04/09/2017, 06/18/2017   Pertinent  Health Maintenance Due  Topic Date Due   INFLUENZA VACCINE  Completed   DEXA SCAN  Completed      12/21/2021   10:22 AM 12/21/2021   10:00 PM 12/23/2021    8:08 AM 01/01/2022   11:05 AM 01/04/2022   10:00 AM  Fall Risk  Falls in the past year?    1   Was there an injury with Fall?    1   Fall Risk Category Calculator    3   Fall Risk Category    High   Patient Fall Risk Level Moderate fall risk High fall risk High fall risk High fall risk High fall risk   Functional Status Survey:    Vitals:   02/02/22 1137  BP: 132/75  Pulse: 91  Resp: 20  SpO2: 95%  Weight: 122 lb 12.8 oz (55.7 kg)   Body mass index is 22.46 kg/m. Physical Exam Vitals and nursing note reviewed.  Constitutional:      General: She is not in acute distress.    Appearance: She is not diaphoretic.  HENT:     Head: Normocephalic and atraumatic.     Comments: Posterior scalp wound healed.     Mouth/Throat:     Mouth: Mucous membranes are moist.     Pharynx: Oropharynx is  clear.  Eyes:     Conjunctiva/sclera: Conjunctivae normal.     Pupils: Pupils are equal, round, and reactive to light.  Neck:     Vascular: No JVD.  Cardiovascular:     Rate and Rhythm: Normal rate. Rhythm irregular.     Heart sounds: Murmur heard.  Pulmonary:     Effort: Pulmonary effort is normal. No respiratory distress.     Breath sounds: Normal breath sounds. No wheezing.  Abdominal:     General: Bowel sounds are normal. There is no distension.     Palpations: Abdomen is soft.     Tenderness: There is no abdominal tenderness.  Musculoskeletal:     Right lower leg: Swelling and tenderness present.     Left lower leg: Normal.     Comments: RLE with +CMS in cast.   Skin:    General: Skin is warm and dry.     Coloration: Skin is pale.  Neurological:     General: No focal deficit present.     Mental Status: She is alert and oriented to person, place, and time. Mental status is at baseline.     Cranial Nerves: No cranial nerve deficit.  Psychiatric:        Mood and Affect: Mood normal.     Labs reviewed: Recent Labs    12/07/21 0548 12/20/21 2238 12/22/21 0924 12/29/21 0000 01/30/22 1809  NA 137 133* 134* 135* 132*  K 4.2 4.4 4.4 4.5 4.3  CL 104 99 104 99 100  CO2 '23 24 22 '$ 23* 22  GLUCOSE 90 106* 91  --  116*  BUN '15 16 14 13 22  '$ CREATININE 0.79 0.78 0.85 0.9 0.88  CALCIUM 9.1 9.3 8.9 9.1 9.4  MG 1.6*  --   --   --  1.9   Recent Labs    07/25/21 0945 08/22/21 1134 10/03/21 1055  AST '20 22 22  '$ ALT '18 20 18  '$ ALKPHOS 86 84 75  BILITOT 0.6 0.6 0.8  PROT 6.8 7.5 7.1  ALBUMIN 4.3 4.5 4.5   Recent Labs    12/07/21 0548 12/13/21 0000 12/22/21 0924 12/29/21 0000 01/04/22 0937 01/06/22 0000 01/30/22 1809  WBC 7.2   < > 8.6   < > 8.1 5.8 7.6  NEUTROABS 4.0  --   --   --  4.7  --  4.9  HGB 8.7*   < > 8.1*   < > 9.7* 9.4* 11.2*  HCT 25.0*   < > 24.8*   < > 29.8* 29* 33.8*  MCV 98.4   < > 108.8*  --  111.2*  --  106.6*  PLT 141*   < > 206  --  258  --  194    < > = values in this interval not displayed.   Lab Results  Component Value Date   TSH 22.70 (A) 01/06/2022   Lab Results  Component Value Date   HGBA1C 5.5 01/12/2013   Lab Results  Component Value Date   CHOL 115 02/02/2021   HDL 48 02/02/2021   LDLCALC 49 02/02/2021   TRIG 91 02/02/2021    Significant Diagnostic Results in last 30 days:  DG Knee Complete 4 Views Right  Result Date: 01/30/2022 CLINICAL DATA:  Fall. EXAM: RIGHT KNEE - COMPLETE 4+ VIEW COMPARISON:  None Available. FINDINGS: No evidence of acute fracture or dislocation. Mild-to-moderate tricompartmental degenerative changes are noted and most pronounced in the lateral and patellofemoral compartments. There is a small to moderate suprapatellar joint effusion. Vascular calcifications are present in the soft tissues. IMPRESSION: 1. No acute fracture or dislocation. 2. Small to moderate suprapatellar joint effusion. 3. Tricompartmental degenerative changes. Electronically Signed   By: Brett Fairy M.D.   On: 01/30/2022 23:58   DG Ankle Complete Right  Result Date: 01/30/2022 CLINICAL DATA:  Fall EXAM: RIGHT ANKLE - COMPLETE 3+ VIEW COMPARISON:  None Available. FINDINGS: Extensive soft tissue swelling. Acute fracture involving the distal fibula with about 1/2 shaft diameter posterior and 1/4 shaft diameter lateral displacement of distal fracture fragment. Possible small displaced fracture fragment off the posterior malleolus. Suspect nondisplaced medial malleolar fracture is well. Vascular calcification. Tiny plantar calcaneal spur. Mortise symmetric. IMPRESSION: 1. Acute displaced distal fibular fracture. Suspect nondisplaced medial malleolar fracture. 2. Possible small displaced posterior malleolar fracture. Electronically Signed   By: Donavan Foil M.D.   On: 01/30/2022 18:48    Assessment/Plan 1. Closed fracture of right ankle, initial encounter Fibular and medial malleolar fx In cast Using tylenol and ultram prn  for pain WIll f/u with ortho in am If immobile ? Increasing ASA to BID for DVT prophylaxis.   2. Permanent atrial fibrillation ([place]) Now on asa due to hx of bleeding Also on digoxin and toprol for rate control   3. Essential hypertension Controlled   4. Interstitial lung disease ([place]) NO current issues with hx of RA  5. Hypothyroidism, unspecified type Lab Results  Component Value Date   TSH 22.70 (A) 01/06/2022  Synthroid dose adjusted in Oct per Dr Benay Spice He will see her back in 2 months for recheck   6. Rheumatoid arthritis involving hand with negative rheumatoid factor, unspecified laterality ([place]) Followed by Rheumatology   7. Iron deficiency anemia, unspecified iron deficiency anemia type Followed by Dr Benay Spice On iron daily at this time Will check CBC due to pale color and weakness.   8.  Hyponatremia NA 132, appears dry on exam FR was 1500 will increase 1800 Repeat BMP due to weakness.   9. Major vascular neurocognitive disorder MMSE 20/30 10/30/21 Followed by Dr Loretta Plume for this and hx of TIA/CVA, and cerebral amyloid angiopathy  Now on rivastigmine   Family/ staff Communication: resident and nurse Cheri  Labs/tests ordered:  CBC BMP

## 2022-02-03 DIAGNOSIS — R531 Weakness: Secondary | ICD-10-CM | POA: Diagnosis not present

## 2022-02-03 LAB — CBC AND DIFFERENTIAL
HCT: 29 — AB (ref 36–46)
Hemoglobin: 9.7 — AB (ref 12.0–16.0)
Platelets: 184 10*3/uL (ref 150–400)
WBC: 6.9

## 2022-02-03 LAB — BASIC METABOLIC PANEL
BUN: 15 (ref 4–21)
CO2: 26 — AB (ref 13–22)
Chloride: 98 — AB (ref 99–108)
Creatinine: 0.7 (ref 0.5–1.1)
Glucose: 97
Potassium: 4.5 mEq/L (ref 3.5–5.1)
Sodium: 133 — AB (ref 137–147)

## 2022-02-03 LAB — COMPREHENSIVE METABOLIC PANEL
Calcium: 8.9 (ref 8.7–10.7)
eGFR: 85

## 2022-02-03 LAB — CBC: RBC: 2.76 — AB (ref 3.87–5.11)

## 2022-02-05 DIAGNOSIS — N3000 Acute cystitis without hematuria: Secondary | ICD-10-CM | POA: Diagnosis not present

## 2022-02-05 DIAGNOSIS — R2681 Unsteadiness on feet: Secondary | ICD-10-CM | POA: Diagnosis not present

## 2022-02-05 DIAGNOSIS — M6289 Other specified disorders of muscle: Secondary | ICD-10-CM | POA: Diagnosis not present

## 2022-02-05 DIAGNOSIS — R296 Repeated falls: Secondary | ICD-10-CM | POA: Diagnosis not present

## 2022-02-05 DIAGNOSIS — G3184 Mild cognitive impairment, so stated: Secondary | ICD-10-CM | POA: Diagnosis not present

## 2022-02-05 DIAGNOSIS — M24871 Other specific joint derangements of right ankle, not elsewhere classified: Secondary | ICD-10-CM | POA: Diagnosis not present

## 2022-02-05 DIAGNOSIS — Z9181 History of falling: Secondary | ICD-10-CM | POA: Diagnosis not present

## 2022-02-05 DIAGNOSIS — R278 Other lack of coordination: Secondary | ICD-10-CM | POA: Diagnosis not present

## 2022-02-05 DIAGNOSIS — R2689 Other abnormalities of gait and mobility: Secondary | ICD-10-CM | POA: Diagnosis not present

## 2022-02-06 ENCOUNTER — Encounter: Payer: Self-pay | Admitting: Internal Medicine

## 2022-02-06 ENCOUNTER — Non-Acute Institutional Stay (SKILLED_NURSING_FACILITY): Payer: Medicare Other | Admitting: Internal Medicine

## 2022-02-06 ENCOUNTER — Encounter: Payer: Medicare Other | Admitting: Internal Medicine

## 2022-02-06 DIAGNOSIS — I10 Essential (primary) hypertension: Secondary | ICD-10-CM | POA: Diagnosis not present

## 2022-02-06 DIAGNOSIS — G3184 Mild cognitive impairment, so stated: Secondary | ICD-10-CM

## 2022-02-06 DIAGNOSIS — R296 Repeated falls: Secondary | ICD-10-CM | POA: Diagnosis not present

## 2022-02-06 DIAGNOSIS — M6289 Other specified disorders of muscle: Secondary | ICD-10-CM | POA: Diagnosis not present

## 2022-02-06 DIAGNOSIS — Z9181 History of falling: Secondary | ICD-10-CM | POA: Diagnosis not present

## 2022-02-06 DIAGNOSIS — R2689 Other abnormalities of gait and mobility: Secondary | ICD-10-CM | POA: Diagnosis not present

## 2022-02-06 DIAGNOSIS — R2681 Unsteadiness on feet: Secondary | ICD-10-CM | POA: Diagnosis not present

## 2022-02-06 DIAGNOSIS — R278 Other lack of coordination: Secondary | ICD-10-CM | POA: Diagnosis not present

## 2022-02-06 DIAGNOSIS — D509 Iron deficiency anemia, unspecified: Secondary | ICD-10-CM | POA: Diagnosis not present

## 2022-02-06 DIAGNOSIS — I4821 Permanent atrial fibrillation: Secondary | ICD-10-CM | POA: Diagnosis not present

## 2022-02-06 DIAGNOSIS — M24871 Other specific joint derangements of right ankle, not elsewhere classified: Secondary | ICD-10-CM | POA: Diagnosis not present

## 2022-02-06 DIAGNOSIS — E871 Hypo-osmolality and hyponatremia: Secondary | ICD-10-CM

## 2022-02-06 DIAGNOSIS — S82891A Other fracture of right lower leg, initial encounter for closed fracture: Secondary | ICD-10-CM

## 2022-02-06 DIAGNOSIS — S0003XD Contusion of scalp, subsequent encounter: Secondary | ICD-10-CM | POA: Diagnosis not present

## 2022-02-06 DIAGNOSIS — M06049 Rheumatoid arthritis without rheumatoid factor, unspecified hand: Secondary | ICD-10-CM

## 2022-02-06 DIAGNOSIS — E039 Hypothyroidism, unspecified: Secondary | ICD-10-CM | POA: Diagnosis not present

## 2022-02-06 DIAGNOSIS — N3000 Acute cystitis without hematuria: Secondary | ICD-10-CM | POA: Diagnosis not present

## 2022-02-06 DIAGNOSIS — M1991 Primary osteoarthritis, unspecified site: Secondary | ICD-10-CM | POA: Insufficient documentation

## 2022-02-06 NOTE — [place] Notes (Unsigned)
Provider:   Location:  [facility] Room Number: 152/A Place of Service:  SNF (31)  PCP: Virgie Dad, MD Patient Care Team: Virgie Dad, MD as PCP - General (Internal Medicine) Pixie Casino, MD as PCP - Cardiology (Cardiology) Pieter Partridge, DO as Consulting Physician (Neurology)  Extended Emergency Contact Information Primary Emergency Contact: [name],[name] Address: [address]          [place], [place] [address] Johnnette Litter of [place] Phone: [phone] Mobile Phone: [phone] Relation: Daughter Secondary Emergency Contact: Eula Listen Address: [address]          [place], [place] [address] Johnnette Litter of [place] Mobile Phone: [phone] Relation: Son  Code Status: DNR Goals of Care: Advanced Directive information    02/06/2022   10:06 AM  Advanced Directives  Does Patient Have a Medical Advance Directive? Yes  Type of Paramedic of [place];Living will;Out of facility DNR (pink MOST or yellow form)  Does patient want to make changes to medical advance directive? No - Patient declined  Copy of [place] in Chart? Yes - validated most recent copy scanned in chart (See row information)      Chief Complaint  Patient presents with   New Admit To SNF    New admission    Quality Metric Gaps    Discussed the need for AWV    HPI: Patient is a 88 y.o. female seen today for admission to SNF after sustaining   Acute displaced distal right fibular fracture. nondisplaced medial malleolar fracture. small displaced posterior malleolar fracture. Now in splint and Cat She is NWB till she sees Ortho for further imaging and possible Boot.  Patient fell while coming out of the car. She just lost her footing No dizziness no LOC did not hit her head No New Complains today Pain seems controlled Does feel Little Dizzy sometimes when stands up   She also has h/o  A Fib Off  Eliquis due to recurrent falls Only on aspirin now Scalp Hematoma which has now completely healed Hypertension and HLD H/o TIA  Cerebral Amyloid angiopathy on MRI Colon Cancer in Remission  Anemia  Cognitive impairment Last MMSE in Neurology was 22/30 in 10/23 Rheumatoid arthritis Taken off embrel for immunotherapy Now only on [place] Past Medical History:  Diagnosis Date   Allergy    seasonal   Anemia    Anxiety    Arthritis    Back   Asymptomatic menopausal state    Atrial fibrillation ([place])    Cancer ([place]) 2006   Colon.  Basal Cell Skin cancer- right arm   Cataract    removed bilateral   Chronic atrial fibrillation (HCC)    Colon cancer (HCC)    Constipation due to pain medication therapy    after heart surgery   Deficiency of other specified B group vitamins    Diplopia    Disorientation, unspecified    Dysrhythmia    PAF   GERD (gastroesophageal reflux disease)    Heart murmur    Hyperlipidemia    Hypertension    Hypothyroidism    Malignant neoplasm of colon, unspecified ([place])    Other specified diseases of blood and blood-forming organs    Other specified disorders of bone density and structure, unspecified forearm    Other thrombophilia ([place])    per matrix   Overactive bladder    per Matrix   Personal history of other malignant neoplasm of large  intestine    Presbycusis, bilateral    per matrix   RA (rheumatoid arthritis) ([place])    Restless leg    Seizures ([place])    after Heart Surgey   Stroke ([place])    TIA- found by neurologist after    Valgus deformity, not elsewhere classified, right knee    per matrix   Vascular dementia, mild, without behavioral disturbance, psychotic disturbance, mood disturbance, and anxiety ([place])    per matrix   Past Surgical History:  Procedure Laterality Date   ABDOMINAL HYSTERECTOMY  1970   Partial    COLON RESECTION  2006   cancer   COLON SURGERY     COLONOSCOPY     EYE SURGERY Bilateral    Cataract   MAXIMUM ACCESS  (MAS)POSTERIOR LUMBAR INTERBODY FUSION (PLIF) 2 LEVEL N/A 04/12/2015   Procedure: Lumbar Three-Five Decompression, Pedicle Screw Fixation, and Posteriolateral Arthrodesis;  Surgeon: Erline Levine, MD;  Location: [facility];  Service: Neurosurgery;  Laterality: N/A;  L3-4 L4-5 Maximum access posterior lumbar fusion, possible interbodies and resection of synovial cyst at L4-5   MITRAL VALVE REPAIR  01/20/2013   Gore-tex cords to P1, P2, and P3. Magic suture to posterior medial commisure, #30 Physio 1 ring. Done in [place]   TONSILLECTOMY     about [place]  01/20/2013   #28 TriAd ring done in [place]    reports that she has never smoked. She has been exposed to tobacco smoke. She has never used smokeless tobacco. She reports current alcohol use of about 1.0 standard drink of alcohol per week. She reports that she does not use drugs. Social History   Socioeconomic History   Marital status: Widowed    Spouse name: Not on file   Number of children: Not on file   Years of education: Not on file   Highest education level: Not on file  Occupational History   Occupation: elementary school teacher    Comment: retired   Tobacco Use   Smoking status: Never    Passive exposure: Past ("mother smoked")   Smokeless tobacco: Never  Vaping Use   Vaping Use: Never used  Substance and Sexual Activity   Alcohol use: Yes    Alcohol/week: 1.0 standard drink of alcohol    Types: 1 Glasses of wine per week    Comment: 1-2 per week   Drug use: No   Sexual activity: Not Currently  Other Topics Concern   Not on file  Social History Narrative   Social History      Diet? Healthy- low salt, sugar, fat      Do you drink/eat things with caffeine? On occasion      Marital status?        widow                        What year were you married? 1958      Do you live in a house, apartment, assisted living, condo, trailer, etc.? apartment      Is it one or more stories? one      How  many persons live in your home? one      Do you have any pets in your home? (please list) no      Highest level of education completed? 4 year college      Current or past profession: Statistician      Do you exercise?  yes                         Type & how often? Classes- senior retirement community/ some walking      Advanced Directives      Do you have a living will?      Do you have a DNR form?                                  If not, do you want to discuss one?      Do you have signed POA/HPOA for forms?       Functional Status Completed by: Daughter, Di Kindle      Do you have difficulty bathing or dressing yourself? no      Do you have difficulty preparing food or eating? no      Do you have difficulty managing your medications? no      Do you have difficulty managing your finances? no      Do you have difficulty affording your medications? no   Right handed   [facility]   Social Determinants of Health   Financial Resource Strain: Low Risk  (01/28/2018)   Overall Financial Resource Strain (CARDIA)    Difficulty of Paying Living Expenses: Not hard at all  Food Insecurity: No Food Insecurity (12/21/2021)   Hunger Vital Sign    Worried About Running Out of Food in the Last Year: Never true    Ran Out of Food in the Last Year: Never true  Transportation Needs: No Transportation Needs (12/21/2021)   PRAPARE - Hydrologist (Medical): No    Lack of Transportation (Non-Medical): No  Physical Activity: Sufficiently Active (01/28/2018)   Exercise Vital Sign    Days of Exercise per Week: 6 days    Minutes of Exercise per Session: 30 min  Stress: No Stress Concern Present (01/28/2018)   [place]    Feeling of Stress : Only a little  Social Connections: Moderately Isolated (01/28/2018)   Social Connection and Isolation Panel [NHANES]    Frequency of  Communication with Friends and Family: More than three times a week    Frequency of Social Gatherings with Friends and Family: More than three times a week    Attends Religious Services: Never    Marine scientist or Organizations: No    Attends Archivist Meetings: Never    Marital Status: Widowed  Intimate Partner Violence: Not At Risk (12/21/2021)   Humiliation, Afraid, Rape, and Kick questionnaire    Fear of Current or Ex-Partner: No    Emotionally Abused: No    Physically Abused: No    Sexually Abused: No    Functional Status Survey:    Family History  Problem Relation Age of Onset   Lung disease Mother 14   Heart disease Father 52   Colon polyps Daughter    Alcohol abuse Son    Colon cancer Neg Hx    Stomach cancer Neg Hx    Rectal cancer Neg Hx    Crohn's disease Neg Hx    Esophageal cancer Neg Hx     Health Maintenance  Topic Date Due   Medicare Annual Wellness (AWV)  02/07/2022   COVID-19 Vaccine (6 - Moderna risk series) 03/26/2022   TETANUS/TDAP  04/16/2027   Pneumonia  Vaccine 74+ Years old  Completed   INFLUENZA VACCINE  Completed   DEXA SCAN  Completed   Zoster Vaccines- Shingrix  Completed   HPV VACCINES  Aged Out    Allergies  Allergen Reactions   Claritin [Loratadine] Other (See Comments)    Listed on MAR Unknown reaction   Codeine Other (See Comments)    "just don't take it well"   Gabapentin Other (See Comments)    Dizziness    Molds & Smuts     dust   Pollen Extract Swelling    Grass   Bee Venom Swelling and Rash   Wasp Venom Swelling and Rash    Outpatient Encounter Medications as of 02/06/2022  Medication Sig   aspirin EC 81 MG tablet Take 1 tablet (81 mg total) by mouth daily. Swallow whole.   atorvastatin (LIPITOR) 20 MG tablet TAKE 1 TABLET DAILY (Patient taking differently: Take 20 mg by mouth at bedtime.)   Cholecalciferol (VITAMIN D) 50 MCG (2000 UT) CAPS Take 2,000 Units by mouth in the morning.   digoxin  (LANOXIN) 0.125 MG tablet TAKE 1 TABLET DAILY   ferrous sulfate 325 (65 FE) MG tablet Take 325 mg by mouth daily with breakfast.   leflunomide (ARAVA) 20 MG tablet Take 20 mg by mouth in the morning.   levothyroxine (SYNTHROID) 100 MCG tablet Take 1 tablet (100 mcg total) by mouth daily before breakfast.   metoprolol succinate (TOPROL-XL) 50 MG 24 hr tablet TAKE 1 TABLET DAILY   mirtazapine (REMERON) 15 MG tablet TAKE 1 TABLET AT BEDTIME   Multiple Vitamin (MULTIVITAMIN) tablet Take 1 tablet by mouth in the morning.   Multiple Vitamins-Minerals (PRESERVISION AREDS 2) CAPS Take 1 capsule by mouth 2 (two) times daily.   omeprazole (PRILOSEC) 40 MG capsule TAKE 1 CAPSULE DAILY   ondansetron (ZOFRAN) 4 MG tablet Take 4 mg by mouth every 6 (six) hours as needed for nausea or vomiting.   sodium chloride 1 g tablet Take 1 g by mouth in the morning.   traMADol (ULTRAM) 50 MG tablet Take 0.5 tablets (25 mg total) by mouth 2 (two) times daily as needed for up to 14 days.   valsartan (DIOVAN) 80 MG tablet Take 0.5 tablets (40 mg total) by mouth daily.   Vibegron (GEMTESA) 75 MG TABS Take 32.5 mg by mouth at bedtime.   vitamin B-12 (CYANOCOBALAMIN) 1000 MCG tablet Take 1,000 mcg by mouth in the morning.   No facility-administered encounter medications on file as of 02/06/2022.    Review of Systems  Constitutional:  Positive for activity change. Negative for appetite change.  HENT: Negative.    Respiratory:  Negative for cough and shortness of breath.   Cardiovascular:  Negative for leg swelling.  Gastrointestinal:  Negative for constipation.  Genitourinary: Negative.   Musculoskeletal:  Positive for gait problem. Negative for arthralgias and myalgias.  Skin: Negative.   Neurological:  Positive for dizziness. Negative for weakness.  Psychiatric/Behavioral:  Negative for confusion, dysphoric mood and sleep disturbance.     Vitals:   02/06/22 0940  BP: (!) 143/80  Pulse: 83  Resp: 18  Temp:  97.8 F (36.6 C)  TempSrc: Temporal  SpO2: 95%  Weight: 122 lb 12.8 oz (55.7 kg)  Height: '5\' 2"'$  (1.575 m)   Body mass index is 22.46 kg/m. Physical Exam Vitals reviewed.  Constitutional:      Appearance: Normal appearance.  HENT:     Head: Normocephalic.     Nose: Nose normal.  Mouth/Throat:     Mouth: Mucous membranes are moist.     Pharynx: Oropharynx is clear.  Eyes:     Pupils: Pupils are equal, round, and reactive to light.  Cardiovascular:     Rate and Rhythm: Normal rate. Rhythm irregular.     Pulses: Normal pulses.     Heart sounds: Normal heart sounds. No murmur heard. Pulmonary:     Effort: Pulmonary effort is normal.     Breath sounds: Normal breath sounds.  Abdominal:     General: Abdomen is flat. Bowel sounds are normal.     Palpations: Abdomen is soft.  Musculoskeletal:        General: No swelling.     Cervical back: Neck supple.  Skin:    General: Skin is warm.  Neurological:     General: No focal deficit present.     Mental Status: She is alert.  Psychiatric:        Mood and Affect: Mood normal.        Thought Content: Thought content normal.     Labs reviewed: Basic Metabolic Panel: Recent Labs    12/07/21 0548 12/20/21 2238 12/22/21 0924 12/29/21 0000 01/30/22 1809 02/03/22 0000  NA 137 133* 134* 135* 132* 133*  K 4.2 4.4 4.4 4.5 4.3 4.5  CL 104 99 104 99 100 98*  CO2 '23 24 22 '$ 23* 22 26*  GLUCOSE 90 106* 91  --  116*  --   BUN '15 16 14 13 22 15  '$ CREATININE 0.79 0.78 0.85 0.9 0.88 0.7  CALCIUM 9.1 9.3 8.9 9.1 9.4 8.9  MG 1.6*  --   --   --  1.9  --    Liver Function Tests: Recent Labs    07/25/21 0945 08/22/21 1134 10/03/21 1055  AST '20 22 22  '$ ALT '18 20 18  '$ ALKPHOS 86 84 75  BILITOT 0.6 0.6 0.8  PROT 6.8 7.5 7.1  ALBUMIN 4.3 4.5 4.5   No results for input(s): "LIPASE", "AMYLASE" in the last 8760 hours. No results for input(s): "AMMONIA" in the last 8760 hours. CBC: Recent Labs    12/07/21 0548 12/13/21 0000  12/22/21 0924 12/29/21 0000 01/04/22 0937 01/06/22 0000 01/30/22 1809 02/03/22 0000  WBC 7.2   < > 8.6   < > 8.1 5.8 7.6 6.9  NEUTROABS 4.0  --   --   --  4.7  --  4.9  --   HGB 8.7*   < > 8.1*   < > 9.7* 9.4* 11.2* 9.7*  HCT 25.0*   < > 24.8*   < > 29.8* 29* 33.8* 29*  MCV 98.4   < > 108.8*  --  111.2*  --  106.6*  --   PLT 141*   < > 206  --  258  --  194 184   < > = values in this interval not displayed.   Cardiac Enzymes: No results for input(s): "CKTOTAL", "CKMB", "CKMBINDEX", "TROPONINI" in the last 8760 hours. BNP: Invalid input(s): "POCBNP" Lab Results  Component Value Date   HGBA1C 5.5 01/12/2013   Lab Results  Component Value Date   TSH 22.70 (A) 01/06/2022   Lab Results  Component Value Date   [id number] 338 12/06/2021   Lab Results  Component Value Date   FOLATE 10.2 12/06/2021   Lab Results  Component Value Date   IRON 73 12/06/2021   TIBC 248 (L) 12/06/2021   FERRITIN 26 05/16/2021    Imaging and Procedures  obtained prior to SNF admission: DG Knee Complete 4 Views Right  Result Date: 01/30/2022 CLINICAL DATA:  Fall. EXAM: RIGHT KNEE - COMPLETE 4+ VIEW COMPARISON:  None Available. FINDINGS: No evidence of acute fracture or dislocation. Mild-to-moderate tricompartmental degenerative changes are noted and most pronounced in the lateral and patellofemoral compartments. There is a small to moderate suprapatellar joint effusion. Vascular calcifications are present in the soft tissues. IMPRESSION: 1. No acute fracture or dislocation. 2. Small to moderate suprapatellar joint effusion. 3. Tricompartmental degenerative changes. Electronically Signed   By: Brett Fairy M.D.   On: 01/30/2022 23:58   DG Ankle Complete Right  Result Date: 01/30/2022 CLINICAL DATA:  Fall EXAM: RIGHT ANKLE - COMPLETE 3+ VIEW COMPARISON:  None Available. FINDINGS: Extensive soft tissue swelling. Acute fracture involving the distal fibula with about 1/2 shaft diameter posterior and 1/4  shaft diameter lateral displacement of distal fracture fragment. Possible small displaced fracture fragment off the posterior malleolus. Suspect nondisplaced medial malleolar fracture is well. Vascular calcification. Tiny plantar calcaneal spur. Mortise symmetric. IMPRESSION: 1. Acute displaced distal fibular fracture. Suspect nondisplaced medial malleolar fracture. 2. Possible small displaced posterior malleolar fracture. Electronically Signed   By: Donavan Foil M.D.   On: 01/30/2022 18:48    Assessment/Plan 1. Closed fracture of right ankle, initial encounter NWB pain controlled with Tylenol and tramadol  2. Permanent atrial fibrillation (HCC) Off Eliquis On aspirin Also on Metoprolol and Digoxin  3. Essential hypertension Was high this morning but eventually came down Contineu to monitor  4. Hypothyroidism, unspecified type TSH was high in 10/23 Dose was adjusted by Dr Benay Spice  5. Rheumatoid arthritis involving hand with negative rheumatoid factor, unspecified laterality ([place]) On arava  6. Iron deficiency anemia, unspecified iron deficiency anemia type On anemia Hgb is good   7. Hyponatremia On Sodium tabs Repeat BMP pending  8. Hematoma of scalp, subsequent encounter ***  9. Mild cognitive impairment with memory loss *** 10 Urinary incontinence    Family/ staff Communication:   Labs/tests ordered:

## 2022-02-07 ENCOUNTER — Encounter: Payer: Medicare Other | Admitting: Internal Medicine

## 2022-02-07 DIAGNOSIS — M6289 Other specified disorders of muscle: Secondary | ICD-10-CM | POA: Diagnosis not present

## 2022-02-07 DIAGNOSIS — R296 Repeated falls: Secondary | ICD-10-CM | POA: Diagnosis not present

## 2022-02-07 DIAGNOSIS — R278 Other lack of coordination: Secondary | ICD-10-CM | POA: Diagnosis not present

## 2022-02-07 DIAGNOSIS — G3184 Mild cognitive impairment, so stated: Secondary | ICD-10-CM | POA: Diagnosis not present

## 2022-02-07 DIAGNOSIS — Z9181 History of falling: Secondary | ICD-10-CM | POA: Diagnosis not present

## 2022-02-07 DIAGNOSIS — R2681 Unsteadiness on feet: Secondary | ICD-10-CM | POA: Diagnosis not present

## 2022-02-07 DIAGNOSIS — N3000 Acute cystitis without hematuria: Secondary | ICD-10-CM | POA: Diagnosis not present

## 2022-02-07 DIAGNOSIS — R2689 Other abnormalities of gait and mobility: Secondary | ICD-10-CM | POA: Diagnosis not present

## 2022-02-07 DIAGNOSIS — M24871 Other specific joint derangements of right ankle, not elsewhere classified: Secondary | ICD-10-CM | POA: Diagnosis not present

## 2022-02-08 ENCOUNTER — Non-Acute Institutional Stay (SKILLED_NURSING_FACILITY): Payer: Medicare Other | Admitting: Adult Health

## 2022-02-08 ENCOUNTER — Encounter: Payer: Self-pay | Admitting: Adult Health

## 2022-02-08 DIAGNOSIS — R296 Repeated falls: Secondary | ICD-10-CM | POA: Diagnosis not present

## 2022-02-08 DIAGNOSIS — R062 Wheezing: Secondary | ICD-10-CM

## 2022-02-08 DIAGNOSIS — R051 Acute cough: Secondary | ICD-10-CM | POA: Diagnosis not present

## 2022-02-08 DIAGNOSIS — M6289 Other specified disorders of muscle: Secondary | ICD-10-CM | POA: Diagnosis not present

## 2022-02-08 DIAGNOSIS — D500 Iron deficiency anemia secondary to blood loss (chronic): Secondary | ICD-10-CM | POA: Diagnosis not present

## 2022-02-08 DIAGNOSIS — M24871 Other specific joint derangements of right ankle, not elsewhere classified: Secondary | ICD-10-CM | POA: Diagnosis not present

## 2022-02-08 DIAGNOSIS — R278 Other lack of coordination: Secondary | ICD-10-CM | POA: Diagnosis not present

## 2022-02-08 DIAGNOSIS — G3184 Mild cognitive impairment, so stated: Secondary | ICD-10-CM | POA: Diagnosis not present

## 2022-02-08 DIAGNOSIS — I10 Essential (primary) hypertension: Secondary | ICD-10-CM | POA: Diagnosis not present

## 2022-02-08 DIAGNOSIS — N3000 Acute cystitis without hematuria: Secondary | ICD-10-CM | POA: Diagnosis not present

## 2022-02-08 LAB — CBC AND DIFFERENTIAL
HCT: 31 — AB (ref 36–46)
Hemoglobin: 10.2 — AB (ref 12.0–16.0)
Platelets: 282 10*3/uL (ref 150–400)
WBC: 6.9

## 2022-02-08 LAB — CBC: RBC: 2.96 — AB (ref 3.87–5.11)

## 2022-02-08 MED ORDER — CLONIDINE HCL 0.1 MG PO TABS: 0.1000 mg | ORAL_TABLET | Freq: Every day | ORAL | 0 refills | Status: AC | PRN

## 2022-02-08 MED ORDER — LEVALBUTEROL HCL 0.63 MG/3ML IN NEBU: 0.6300 mg | INHALATION_SOLUTION | Freq: Two times a day (BID) | RESPIRATORY_TRACT | 12 refills | Status: DC

## 2022-02-08 NOTE — Progress Notes (Signed)
Location:  [facility]upational psychologist of Service:  SNF (31) Provider:   Cindi Carbon, [place] [phone]   Virgie Dad, MD  Patient Care Team: Virgie Dad, MD as PCP - General (Internal Medicine) Debara Pickett Nadean Corwin, MD as PCP - Cardiology (Cardiology) Pieter Partridge, DO as Consulting Physician (Neurology)  Extended Emergency Contact Information Primary Emergency Contact: [name],[name] Address: [address]          [place], [place] [address] Johnnette Litter of [place] Phone: [phone] Mobile Phone: [phone] Relation: Daughter Secondary Emergency Contact: Eula Listen Address: [address]          [place], [place] [address] Johnnette Litter of [place] Mobile Phone: [phone] Relation: Son  Code Status:  DNR Goals of care: Advanced Directive information    02/06/2022   10:06 AM  Advanced Directives  Does Patient Have a Medical Advance Directive? Yes  Type of Paramedic of [employer];Living will;Out of facility DNR (pink MOST or yellow form)  Does patient want to make changes to medical advance directive? No - Patient declined  Copy of [place] in Chart? Yes - validated most recent copy scanned in chart (See row information)     Chief Complaint  Patient presents with   Acute Visit    Cough and wheeze    HPI:  Pt is a 88 y.o. female seen today for an acute visit for cough and wheeze as well as HTN.  She is currently in rehab (previously from [facility]) due to a fall. She was seen in the ER 11/7 for ankle pain  Xray of the right ankle showed acute displaced distal fibular fracture. Suspect non displaced medial malleolar fracture and possible small displaced posterior malleolar fracture. She is in cast. Saw ortho and is non weight bearing. Using IS three times daily. On asa daily with hx of afib. (has recent hx of bleeding scalp wound (arterial) and chronic anemia)  Nurse reports a cough  and wheezing present for two days. Had rapid covid done which was negative. Rapid flu done also negative. Has not had a fever. No sputum production or sob. No body aches or chills.   Also had HTN last evening P 217/138 (machine) and 238/108 manually.  Nurse called on call and received ordered for clonidine x 1 given  Other bps have been fairly well controlled with an occasional outlier of 150-170. Past Medical History:  Diagnosis Date   Allergy    seasonal   Anemia    Anxiety    Arthritis    Back   Asymptomatic menopausal state    Atrial fibrillation ([facility])    Cancer ([facility]) 2006   Colon.  Basal Cell Skin cancer- right arm   Cataract    removed bilateral   Chronic atrial fibrillation (H[facility])    Colon cancer (HCC)    Constipation due to pain medication therapy    after heart surgery   Deficiency of other specified B group vitamins    Diplopia    Disorientation, unspecified    Dysrhythmia    PAF   GERD (gastroesophageal reflux disease)    Heart murmur    Hyperlipidemia    Hypertension    Hypothyroidism    Malignant neoplasm of colon, unspecified ([facility])    Other specified diseases of blood and blood-forming organs    Other specified disorders of bone density and structure, unspecified forearm    Other thrombophilia ([facility])    per  matrix   Overactive bladder    per Matrix   Personal history of other malignant neoplasm of large intestine    Presbycusis, bilateral    per matrix   RA (rheumatoid arthritis) (HCC)    Restless leg    Seizures ([facility])    after Heart Surgey   Stroke ([facility])    TIA- found by neurologist after    Valgus deformity, not elsewhere classified, right knee    per matrix   Vascular dementia, mild, without behavioral disturbance, psychotic disturbance, mood disturbance, and anxiety ([facility])    per matrix   Past Surgical History:  Procedure Laterality Date   ABDOMINAL HYSTERECTOMY  1970   Partial    COLON RESECTION  2006   cancer   COLON SURGERY      COLONOSCOPY     EYE SURGERY Bilateral    Cataract   MAXIMUM ACCESS (MAS)POSTERIOR LUMBAR INTERBODY FUSION (PLIF) 2 LEVEL N/A 04/12/2015   Procedure: Lumbar Three-Five Decompression, Pedicle Screw Fixation, and Posteriolateral Arthrodesis;  Surgeon: Erline Levine, MD;  Location: [facility];  Service: Neurosurgery;  Laterality: N/A;  L3-4 L4-5 Maximum access posterior lumbar fusion, possible interbodies and resection of synovial cyst at L4-5   MITRAL VALVE REPAIR  01/20/2013   Gore-tex cords to P1, P2, and P3. Magic suture to posterior medial commisure, #30 Physio 1 ring. Done in [place]   TONSILLECTOMY     about [place]  01/20/2013   #28 TriAd ring done in [place]    Allergies  Allergen Reactions   Claritin [Loratadine] Other (See Comments)    Listed on MAR Unknown reaction   Codeine Other (See Comments)    "just don't take it well"   Gabapentin Other (See Comments)    Dizziness    Molds & Smuts     dust   Pollen Extract Swelling    Grass   Bee Venom Swelling and Rash   Wasp Venom Swelling and Rash    Outpatient Encounter Medications as of 02/08/2022  Medication Sig   aspirin EC 81 MG tablet Take 1 tablet (81 mg total) by mouth daily. Swallow whole.   atorvastatin (LIPITOR) 20 MG tablet TAKE 1 TABLET DAILY (Patient taking differently: Take 20 mg by mouth at bedtime.)   Cholecalciferol (VITAMIN D) 50 MCG (2000 UT) CAPS Take 2,000 Units by mouth in the morning.   digoxin (LANOXIN) 0.125 MG tablet TAKE 1 TABLET DAILY   ferrous sulfate 325 (65 FE) MG tablet Take 325 mg by mouth daily with breakfast.   leflunomide (ARAVA) 20 MG tablet Take 20 mg by mouth in the morning.   levothyroxine (SYNTHROID) 100 MCG tablet Take 1 tablet (100 mcg total) by mouth daily before breakfast.   metoprolol succinate (TOPROL-XL) 50 MG 24 hr tablet TAKE 1 TABLET DAILY   mirtazapine (REMERON) 15 MG tablet TAKE 1 TABLET AT BEDTIME   Multiple Vitamin (MULTIVITAMIN) tablet Take 1  tablet by mouth in the morning.   Multiple Vitamins-Minerals (PRESERVISION AREDS 2) CAPS Take 1 capsule by mouth 2 (two) times daily.   omeprazole (PRILOSEC) 40 MG capsule TAKE 1 CAPSULE DAILY   ondansetron (ZOFRAN) 4 MG tablet Take 4 mg by mouth every 6 (six) hours as needed for nausea or vomiting.   sodium chloride 1 g tablet Take 1 g by mouth in the morning.   traMADol (ULTRAM) 50 MG tablet Take 0.5 tablets (25 mg total) by mouth 2 (two) times daily as needed for up to  14 days.   valsartan (DIOVAN) 80 MG tablet Take 0.5 tablets (40 mg total) by mouth daily.   Vibegron (GEMTESA) 75 MG TABS Take 32.5 mg by mouth at bedtime.   vitamin B-12 (CYANOCOBALAMIN) 1000 MCG tablet Take 1,000 mcg by mouth in the morning.   No facility-administered encounter medications on file as of 02/08/2022.    Review of Systems  Constitutional:  Positive for activity change. Negative for appetite change, chills, diaphoresis, fatigue, fever and unexpected weight change.  HENT:  Negative for congestion, postnasal drip, rhinorrhea, sinus pressure, sore throat and trouble swallowing.   Respiratory:  Positive for cough and wheezing. Negative for shortness of breath.   Cardiovascular:  Negative for chest pain, palpitations and leg swelling.  Gastrointestinal:  Negative for abdominal distention, abdominal pain, constipation and diarrhea.  Genitourinary:  Negative for difficulty urinating and dysuria.  Musculoskeletal:  Positive for gait problem. Negative for arthralgias, back pain, joint swelling and myalgias.       Right leg pain mild  Neurological:  Negative for dizziness, tremors, seizures, syncope, facial asymmetry, speech difficulty, weakness, light-headedness, numbness and headaches.  Psychiatric/Behavioral:  Negative for agitation, behavioral problems and confusion.        Memory loss    Immunization History  Administered Date(s) Administered   Fluad Quad(high Dose 65+) 01/05/2019, 12/07/2021   H1N1  04/07/2008   Influenza Whole 12/29/2008, 12/22/2009   Influenza, High Dose Seasonal PF 01/15/2012, 12/23/2014, 01/22/2020   Influenza,inj,Quad PF,6+ Mos 01/22/2014, 12/10/2016, 01/17/2018   Influenza,trivalent, recombinat, inj, PF 01/05/2016   Influenza-Unspecified 04/07/2008, 12/15/2014, 01/13/2021   Moderna Covid-19 Vaccine Bivalent Booster 30yr & up 01/29/2022   Moderna Sars-Covid-2 Vaccination 04/07/2019, 05/05/2019, 02/09/2020   PFIZER(Purple Top)SARS-COV-2 Vaccination 01/06/2021   PPD Test 07/17/2010, 04/19/2015, 04/25/2015   Pneumococcal Conjugate-13 11/10/2013   Pneumococcal Polysaccharide-23 12/27/2010, 12/05/2016   Tdap 04/15/2017   Zoster Recombinat (Shingrix) 04/09/2017, 06/18/2017   Pertinent  Health Maintenance Due  Topic Date Due   INFLUENZA VACCINE  Completed   DEXA SCAN  Completed      12/21/2021   10:22 AM 12/21/2021   10:00 PM 12/23/2021    8:08 AM 01/01/2022   11:05 AM 01/04/2022   10:00 AM  FParsonsin the past year?    1   Was there an injury with Fall?    1   Fall Risk Category Calculator    3   Fall Risk Category    High   Patient Fall Risk Level Moderate fall risk High fall risk High fall risk High fall risk High fall risk   Functional Status Survey:    Vitals:   02/08/22 1555  BP: 119/70  Pulse: 84  Resp: 16  Temp: 98.3 F (36.8 C)  SpO2: 97%   There is no height or weight on file to calculate BMI. Physical Exam Constitutional:      Appearance: Normal appearance.  HENT:     Nose: Nose normal.     Mouth/Throat:     Mouth: Mucous membranes are moist.     Pharynx: Oropharynx is clear.  Eyes:     Conjunctiva/sclera: Conjunctivae normal.     Pupils: Pupils are equal, round, and reactive to light.  Cardiovascular:     Rate and Rhythm: Normal rate. Rhythm irregular.  Pulmonary:     Effort: Pulmonary effort is normal.     Breath sounds: Wheezing present.  Abdominal:     General: Bowel sounds are normal. There is no distension.  Palpations: Abdomen is soft.     Tenderness: There is no abdominal tenderness.  Musculoskeletal:     Cervical back: No rigidity or tenderness.     Comments: +CMS to RLE, in cast.   Lymphadenopathy:     Cervical: No cervical adenopathy.  Skin:    General: Skin is warm and dry.     Coloration: Skin is pale.  Neurological:     General: No focal deficit present.     Mental Status: She is alert. Mental status is at baseline.     Labs reviewed: Recent Labs    12/07/21 0548 12/20/21 2238 12/22/21 0924 12/29/21 0000 01/30/22 1809 02/03/22 0000  NA 137 133* 134* 135* 132* 133*  K 4.2 4.4 4.4 4.5 4.3 4.5  CL 104 99 104 99 100 98*  CO2 '23 24 22 '$ 23* 22 26*  GLUCOSE 90 106* 91  --  116*  --   BUN '15 16 14 13 22 15  '$ CREATININE 0.79 0.78 0.85 0.9 0.88 0.7  CALCIUM 9.1 9.3 8.9 9.1 9.4 8.9  MG 1.6*  --   --   --  1.9  --    Recent Labs    07/25/21 0945 08/22/21 1134 10/03/21 1055  AST '20 22 22  '$ ALT '18 20 18  '$ ALKPHOS 86 84 75  BILITOT 0.6 0.6 0.8  PROT 6.8 7.5 7.1  ALBUMIN 4.3 4.5 4.5   Recent Labs    12/07/21 0548 12/13/21 0000 12/22/21 0924 12/29/21 0000 01/04/22 0937 01/06/22 0000 01/30/22 1809 02/03/22 0000  WBC 7.2   < > 8.6   < > 8.1 5.8 7.6 6.9  NEUTROABS 4.0  --   --   --  4.7  --  4.9  --   HGB 8.7*   < > 8.1*   < > 9.7* 9.4* 11.2* 9.7*  HCT 25.0*   < > 24.8*   < > 29.8* 29* 33.8* 29*  MCV 98.4   < > 108.8*  --  111.2*  --  106.6*  --   PLT 141*   < > 206  --  258  --  194 184   < > = values in this interval not displayed.   Lab Results  Component Value Date   TSH 22.70 (A) 01/06/2022   Lab Results  Component Value Date   HGBA1C 5.5 01/12/2013   Lab Results  Component Value Date   CHOL 115 02/02/2021   HDL 48 02/02/2021   LDLCALC 49 02/02/2021   TRIG 91 02/02/2021    Significant Diagnostic Results in last 30 days:  DG Knee Complete 4 Views Right  Result Date: 01/30/2022 CLINICAL DATA:  Fall. EXAM: RIGHT KNEE - COMPLETE 4+ VIEW COMPARISON:   None Available. FINDINGS: No evidence of acute fracture or dislocation. Mild-to-moderate tricompartmental degenerative changes are noted and most pronounced in the lateral and patellofemoral compartments. There is a small to moderate suprapatellar joint effusion. Vascular calcifications are present in the soft tissues. IMPRESSION: 1. No acute fracture or dislocation. 2. Small to moderate suprapatellar joint effusion. 3. Tricompartmental degenerative changes. Electronically Signed   By: Brett Fairy M.D.   On: 01/30/2022 23:58   DG Ankle Complete Right  Result Date: 01/30/2022 CLINICAL DATA:  Fall EXAM: RIGHT ANKLE - COMPLETE 3+ VIEW COMPARISON:  None Available. FINDINGS: Extensive soft tissue swelling. Acute fracture involving the distal fibula with about 1/2 shaft diameter posterior and 1/4 shaft diameter lateral displacement of distal fracture fragment. Possible small displaced fracture fragment  off the posterior malleolus. Suspect nondisplaced medial malleolar fracture is well. Vascular calcification. Tiny plantar calcaneal spur. Mortise symmetric. IMPRESSION: 1. Acute displaced distal fibular fracture. Suspect nondisplaced medial malleolar fracture. 2. Possible small displaced posterior malleolar fracture. Electronically Signed   By: Donavan Foil M.D.   On: 01/30/2022 18:48    Assessment/Plan  1. Acute cough Infrequent Rapid covid and flu neg - levalbuterol (XOPENEX) 0.63 MG/3ML nebulizer solution; Take 3 mLs (0.63 mg total) by nebulization in the morning and at bedtime.  Dispense: 3 mL; Refill: 12  2. Wheezing Check CXR - levalbuterol (XOPENEX) 0.63 MG/3ML nebulizer solution; Take 3 mLs (0.63 mg total) by nebulization in the morning and at bedtime.  Dispense: 3 mL; Refill: 12  3. Essential hypertension Outlier very high, ?distress or pain Will add prn while working up cough.  - cloNIDine (CATAPRES) 0.1 MG tablet; Take 1 tablet (0.1 mg total) by mouth daily as needed. If SBP >180 only   Dispense: 30 tablet; Refill: 0   Labs/tests ordered:  repeat labs pending. CXR ordered.

## 2022-02-09 DIAGNOSIS — N3000 Acute cystitis without hematuria: Secondary | ICD-10-CM | POA: Diagnosis not present

## 2022-02-09 DIAGNOSIS — R296 Repeated falls: Secondary | ICD-10-CM | POA: Diagnosis not present

## 2022-02-09 DIAGNOSIS — G3184 Mild cognitive impairment, so stated: Secondary | ICD-10-CM | POA: Diagnosis not present

## 2022-02-09 DIAGNOSIS — M6289 Other specified disorders of muscle: Secondary | ICD-10-CM | POA: Diagnosis not present

## 2022-02-09 DIAGNOSIS — R278 Other lack of coordination: Secondary | ICD-10-CM | POA: Diagnosis not present

## 2022-02-09 DIAGNOSIS — R2689 Other abnormalities of gait and mobility: Secondary | ICD-10-CM | POA: Diagnosis not present

## 2022-02-09 DIAGNOSIS — M24871 Other specific joint derangements of right ankle, not elsewhere classified: Secondary | ICD-10-CM | POA: Diagnosis not present

## 2022-02-09 DIAGNOSIS — R2681 Unsteadiness on feet: Secondary | ICD-10-CM | POA: Diagnosis not present

## 2022-02-09 DIAGNOSIS — Z9181 History of falling: Secondary | ICD-10-CM | POA: Diagnosis not present

## 2022-02-10 NOTE — Progress Notes (Signed)
A user error has taken place.

## 2022-02-12 DIAGNOSIS — R278 Other lack of coordination: Secondary | ICD-10-CM | POA: Diagnosis not present

## 2022-02-12 DIAGNOSIS — Z9181 History of falling: Secondary | ICD-10-CM | POA: Diagnosis not present

## 2022-02-12 DIAGNOSIS — M24871 Other specific joint derangements of right ankle, not elsewhere classified: Secondary | ICD-10-CM | POA: Diagnosis not present

## 2022-02-12 DIAGNOSIS — R2689 Other abnormalities of gait and mobility: Secondary | ICD-10-CM | POA: Diagnosis not present

## 2022-02-12 DIAGNOSIS — R296 Repeated falls: Secondary | ICD-10-CM | POA: Diagnosis not present

## 2022-02-12 DIAGNOSIS — N3000 Acute cystitis without hematuria: Secondary | ICD-10-CM | POA: Diagnosis not present

## 2022-02-12 DIAGNOSIS — M6289 Other specified disorders of muscle: Secondary | ICD-10-CM | POA: Diagnosis not present

## 2022-02-12 DIAGNOSIS — G3184 Mild cognitive impairment, so stated: Secondary | ICD-10-CM | POA: Diagnosis not present

## 2022-02-12 DIAGNOSIS — R2681 Unsteadiness on feet: Secondary | ICD-10-CM | POA: Diagnosis not present

## 2022-02-13 DIAGNOSIS — R2681 Unsteadiness on feet: Secondary | ICD-10-CM | POA: Diagnosis not present

## 2022-02-13 DIAGNOSIS — M6289 Other specified disorders of muscle: Secondary | ICD-10-CM | POA: Diagnosis not present

## 2022-02-13 DIAGNOSIS — R278 Other lack of coordination: Secondary | ICD-10-CM | POA: Diagnosis not present

## 2022-02-13 DIAGNOSIS — R2689 Other abnormalities of gait and mobility: Secondary | ICD-10-CM | POA: Diagnosis not present

## 2022-02-13 DIAGNOSIS — Z9181 History of falling: Secondary | ICD-10-CM | POA: Diagnosis not present

## 2022-02-13 DIAGNOSIS — N3000 Acute cystitis without hematuria: Secondary | ICD-10-CM | POA: Diagnosis not present

## 2022-02-13 DIAGNOSIS — M24871 Other specific joint derangements of right ankle, not elsewhere classified: Secondary | ICD-10-CM | POA: Diagnosis not present

## 2022-02-13 DIAGNOSIS — G3184 Mild cognitive impairment, so stated: Secondary | ICD-10-CM | POA: Diagnosis not present

## 2022-02-13 DIAGNOSIS — R296 Repeated falls: Secondary | ICD-10-CM | POA: Diagnosis not present

## 2022-02-14 DIAGNOSIS — M6289 Other specified disorders of muscle: Secondary | ICD-10-CM | POA: Diagnosis not present

## 2022-02-14 DIAGNOSIS — R296 Repeated falls: Secondary | ICD-10-CM | POA: Diagnosis not present

## 2022-02-14 DIAGNOSIS — R278 Other lack of coordination: Secondary | ICD-10-CM | POA: Diagnosis not present

## 2022-02-14 DIAGNOSIS — G3184 Mild cognitive impairment, so stated: Secondary | ICD-10-CM | POA: Diagnosis not present

## 2022-02-14 DIAGNOSIS — N3000 Acute cystitis without hematuria: Secondary | ICD-10-CM | POA: Diagnosis not present

## 2022-02-14 DIAGNOSIS — M24871 Other specific joint derangements of right ankle, not elsewhere classified: Secondary | ICD-10-CM | POA: Diagnosis not present

## 2022-02-16 DIAGNOSIS — R278 Other lack of coordination: Secondary | ICD-10-CM | POA: Diagnosis not present

## 2022-02-16 DIAGNOSIS — Z9181 History of falling: Secondary | ICD-10-CM | POA: Diagnosis not present

## 2022-02-16 DIAGNOSIS — R2689 Other abnormalities of gait and mobility: Secondary | ICD-10-CM | POA: Diagnosis not present

## 2022-02-16 DIAGNOSIS — M24871 Other specific joint derangements of right ankle, not elsewhere classified: Secondary | ICD-10-CM | POA: Diagnosis not present

## 2022-02-16 DIAGNOSIS — R2681 Unsteadiness on feet: Secondary | ICD-10-CM | POA: Diagnosis not present

## 2022-02-19 DIAGNOSIS — N3000 Acute cystitis without hematuria: Secondary | ICD-10-CM | POA: Diagnosis not present

## 2022-02-19 DIAGNOSIS — R296 Repeated falls: Secondary | ICD-10-CM | POA: Diagnosis not present

## 2022-02-19 DIAGNOSIS — G3184 Mild cognitive impairment, so stated: Secondary | ICD-10-CM | POA: Diagnosis not present

## 2022-02-19 DIAGNOSIS — M24871 Other specific joint derangements of right ankle, not elsewhere classified: Secondary | ICD-10-CM | POA: Diagnosis not present

## 2022-02-19 DIAGNOSIS — M6289 Other specified disorders of muscle: Secondary | ICD-10-CM | POA: Diagnosis not present

## 2022-02-19 DIAGNOSIS — R278 Other lack of coordination: Secondary | ICD-10-CM | POA: Diagnosis not present

## 2022-02-19 DIAGNOSIS — S82841A Displaced bimalleolar fracture of right lower leg, initial encounter for closed fracture: Secondary | ICD-10-CM | POA: Diagnosis not present

## 2022-02-20 ENCOUNTER — Encounter: Payer: Medicare Other | Admitting: Nurse Practitioner

## 2022-02-20 DIAGNOSIS — R296 Repeated falls: Secondary | ICD-10-CM | POA: Diagnosis not present

## 2022-02-20 DIAGNOSIS — N3000 Acute cystitis without hematuria: Secondary | ICD-10-CM | POA: Diagnosis not present

## 2022-02-20 DIAGNOSIS — Z9181 History of falling: Secondary | ICD-10-CM | POA: Diagnosis not present

## 2022-02-20 DIAGNOSIS — M24871 Other specific joint derangements of right ankle, not elsewhere classified: Secondary | ICD-10-CM | POA: Diagnosis not present

## 2022-02-20 DIAGNOSIS — R278 Other lack of coordination: Secondary | ICD-10-CM | POA: Diagnosis not present

## 2022-02-20 DIAGNOSIS — G3184 Mild cognitive impairment, so stated: Secondary | ICD-10-CM | POA: Diagnosis not present

## 2022-02-20 DIAGNOSIS — R2681 Unsteadiness on feet: Secondary | ICD-10-CM | POA: Diagnosis not present

## 2022-02-20 DIAGNOSIS — R2689 Other abnormalities of gait and mobility: Secondary | ICD-10-CM | POA: Diagnosis not present

## 2022-02-20 DIAGNOSIS — M6289 Other specified disorders of muscle: Secondary | ICD-10-CM | POA: Diagnosis not present

## 2022-02-21 DIAGNOSIS — N3000 Acute cystitis without hematuria: Secondary | ICD-10-CM | POA: Diagnosis not present

## 2022-02-21 DIAGNOSIS — M24871 Other specific joint derangements of right ankle, not elsewhere classified: Secondary | ICD-10-CM | POA: Diagnosis not present

## 2022-02-21 DIAGNOSIS — R296 Repeated falls: Secondary | ICD-10-CM | POA: Diagnosis not present

## 2022-02-21 DIAGNOSIS — M6289 Other specified disorders of muscle: Secondary | ICD-10-CM | POA: Diagnosis not present

## 2022-02-21 DIAGNOSIS — R278 Other lack of coordination: Secondary | ICD-10-CM | POA: Diagnosis not present

## 2022-02-21 DIAGNOSIS — G3184 Mild cognitive impairment, so stated: Secondary | ICD-10-CM | POA: Diagnosis not present

## 2022-02-22 ENCOUNTER — Non-Acute Institutional Stay (SKILLED_NURSING_FACILITY): Payer: Medicare Other | Admitting: Adult Health

## 2022-02-22 DIAGNOSIS — E871 Hypo-osmolality and hyponatremia: Secondary | ICD-10-CM | POA: Diagnosis not present

## 2022-02-22 DIAGNOSIS — R296 Repeated falls: Secondary | ICD-10-CM | POA: Diagnosis not present

## 2022-02-22 DIAGNOSIS — I4821 Permanent atrial fibrillation: Secondary | ICD-10-CM

## 2022-02-22 DIAGNOSIS — R278 Other lack of coordination: Secondary | ICD-10-CM | POA: Diagnosis not present

## 2022-02-22 DIAGNOSIS — G3184 Mild cognitive impairment, so stated: Secondary | ICD-10-CM | POA: Diagnosis not present

## 2022-02-22 DIAGNOSIS — I10 Essential (primary) hypertension: Secondary | ICD-10-CM

## 2022-02-22 DIAGNOSIS — N3000 Acute cystitis without hematuria: Secondary | ICD-10-CM | POA: Diagnosis not present

## 2022-02-22 DIAGNOSIS — M6289 Other specified disorders of muscle: Secondary | ICD-10-CM | POA: Diagnosis not present

## 2022-02-22 DIAGNOSIS — M24871 Other specific joint derangements of right ankle, not elsewhere classified: Secondary | ICD-10-CM | POA: Diagnosis not present

## 2022-02-23 ENCOUNTER — Encounter: Payer: Self-pay | Admitting: Adult Health

## 2022-02-23 DIAGNOSIS — G3184 Mild cognitive impairment, so stated: Secondary | ICD-10-CM | POA: Diagnosis not present

## 2022-02-23 DIAGNOSIS — M6289 Other specified disorders of muscle: Secondary | ICD-10-CM | POA: Diagnosis not present

## 2022-02-23 DIAGNOSIS — R278 Other lack of coordination: Secondary | ICD-10-CM | POA: Diagnosis not present

## 2022-02-23 DIAGNOSIS — N3000 Acute cystitis without hematuria: Secondary | ICD-10-CM | POA: Diagnosis not present

## 2022-02-23 DIAGNOSIS — R296 Repeated falls: Secondary | ICD-10-CM | POA: Diagnosis not present

## 2022-02-23 DIAGNOSIS — M24871 Other specific joint derangements of right ankle, not elsewhere classified: Secondary | ICD-10-CM | POA: Diagnosis not present

## 2022-02-23 MED ORDER — VALSARTAN 160 MG PO TABS: 80.0000 mg | ORAL_TABLET | Freq: Every day | ORAL | 0 refills | Status: DC

## 2022-02-23 NOTE — Progress Notes (Signed)
Location:  Occupational psychologist of Service:  SNF (31) Provider:   Cindi Carbon, [place] [phone]   Virgie Dad, MD  Patient Care Team: Virgie Dad, MD as PCP - General (Internal Medicine) Debara Pickett Nadean Corwin, MD as PCP - Cardiology (Cardiology) Pieter Partridge, DO as Consulting Physician (Neurology)  Extended Emergency Contact Information Primary Emergency Contact: [name],[name] Address: [address]          [place], [place] [address] Johnnette Litter of [place] Phone: [phone] Mobile Phone: [phone] Relation: Daughter Secondary Emergency Contact: Eula Listen Address: [address]          [place], [place] [address] Johnnette Litter of [place] Mobile Phone: [phone] Relation: Son  Code Status:  DNR Goals of care: Advanced Directive information    02/06/2022   10:06 AM  Advanced Directives  Does Patient Have a Medical Advance Directive? Yes  Type of Paramedic of [place];Living will;Out of facility DNR (pink MOST or yellow form)  Does patient want to make changes to medical advance directive? No - Patient declined  Copy of [place] in Chart? Yes - validated most recent copy scanned in chart (See row information)     Chief Complaint  Patient presents with   Acute Visit    High bp    HPI:  Pt is a 88 y.o. female seen today for an acute visit for high bp.   PMH: hx of colon ca in 2006 with recurrence. Noted by colonoscopy 02/22/21, bx showed well differentiated adenocarcinoma. CT in Jan of 2023 showed no residual disease. Had 4 cycles of pembrolizumab. Colonoscopy 09/20/21 showed no residual mass. She is holding off on further treatment due to side effects.  PMH also significant for ILD, anemia, RA, memory loss, HTN, afib, CVA, depression, constipation HLD  Seen in the ED after a mechanical fall 01/30/22 due to ankle pain, Xray of the right ankle showed acute displaced  distal fibular fracture. Suspect non displaced medial malleolar fracture and possible small displaced posterior malleolar fracture. She is non weight bearing followed by ortho in cast.   Nurse provided bp logs SBP ranging 106-180/70-95  Hx of low sodium on FR and salt tabs. Last NA 11/11 133  Past Medical History:  Diagnosis Date   Allergy    seasonal   Anemia    Anxiety    Arthritis    Back   Asymptomatic menopausal state    Atrial fibrillation ([place])    Cancer ([place]) 2006   Colon.  Basal Cell Skin cancer- right arm   Cataract    removed bilateral   Chronic atrial fibrillation (HCC)    Colon cancer (HCC)    Constipation due to pain medication therapy    after heart surgery   Deficiency of other specified B group vitamins    Diplopia    Disorientation, unspecified    Dysrhythmia    PAF   GERD (gastroesophageal reflux disease)    Heart murmur    Hyperlipidemia    Hypertension    Hypothyroidism    Malignant neoplasm of colon, unspecified ([place])    Other specified diseases of blood and blood-forming organs    Other specified disorders of bone density and structure, unspecified forearm    Other thrombophilia ([place])    per matrix   Overactive bladder    per Matrix   Personal history of other malignant neoplasm of large intestine    Presbycusis, bilateral  per matrix   RA (rheumatoid arthritis) ([place])    Restless leg    Seizures (HCC)    after Heart Surgey   Stroke ([place])    TIA- found by neurologist after    Valgus deformity, not elsewhere classified, right knee    per matrix   Vascular dementia, mild, without behavioral disturbance, psychotic disturbance, mood disturbance, and anxiety ([place])    per matrix   Past Surgical History:  Procedure Laterality Date   ABDOMINAL HYSTERECTOMY  1970   Partial    COLON RESECTION  2006   cancer   COLON SURGERY     COLONOSCOPY     EYE SURGERY Bilateral    Cataract   MAXIMUM ACCESS (MAS)POSTERIOR LUMBAR INTERBODY FUSION (PLIF)  2 LEVEL N/A 04/12/2015   Procedure: Lumbar Three-Five Decompression, Pedicle Screw Fixation, and Posteriolateral Arthrodesis;  Surgeon: Erline Levine, MD;  Location: [facility];  Service: Neurosurgery;  Laterality: N/A;  L3-4 L4-5 Maximum access posterior lumbar fusion, possible interbodies and resection of synovial cyst at L4-5   MITRAL VALVE REPAIR  01/20/2013   Gore-tex cords to P1, P2, and P3. Magic suture to posterior medial commisure, #30 Physio 1 ring. Done in [place]   TONSILLECTOMY     about [place]  01/20/2013   #28 TriAd ring done in [place]    Allergies  Allergen Reactions   Claritin [Loratadine] Other (See Comments)    Listed on MAR Unknown reaction   Codeine Other (See Comments)    "just don't take it well"   Gabapentin Other (See Comments)    Dizziness    Molds & Smuts     dust   Pollen Extract Swelling    Grass   Bee Venom Swelling and Rash   Wasp Venom Swelling and Rash    Outpatient Encounter Medications as of 02/22/2022  Medication Sig   aspirin EC 81 MG tablet Take 1 tablet (81 mg total) by mouth daily. Swallow whole.   atorvastatin (LIPITOR) 20 MG tablet TAKE 1 TABLET DAILY (Patient taking differently: Take 20 mg by mouth at bedtime.)   Cholecalciferol (VITAMIN D) 50 MCG (2000 UT) CAPS Take 2,000 Units by mouth in the morning.   cloNIDine (CATAPRES) 0.1 MG tablet Take 1 tablet (0.1 mg total) by mouth daily as needed. If SBP >180 only   digoxin (LANOXIN) 0.125 MG tablet TAKE 1 TABLET DAILY   ferrous sulfate 325 (65 FE) MG tablet Take 325 mg by mouth daily with breakfast.   leflunomide (ARAVA) 20 MG tablet Take 20 mg by mouth in the morning.   levalbuterol (XOPENEX) 0.63 MG/3ML nebulizer solution Take 3 mLs (0.63 mg total) by nebulization in the morning and at bedtime.   levothyroxine (SYNTHROID) 100 MCG tablet Take 1 tablet (100 mcg total) by mouth daily before breakfast.   metoprolol succinate (TOPROL-XL) 50 MG 24 hr tablet TAKE 1  TABLET DAILY   mirtazapine (REMERON) 15 MG tablet TAKE 1 TABLET AT BEDTIME   Multiple Vitamin (MULTIVITAMIN) tablet Take 1 tablet by mouth in the morning.   Multiple Vitamins-Minerals (PRESERVISION AREDS 2) CAPS Take 1 capsule by mouth 2 (two) times daily.   omeprazole (PRILOSEC) 40 MG capsule TAKE 1 CAPSULE DAILY   ondansetron (ZOFRAN) 4 MG tablet Take 4 mg by mouth every 6 (six) hours as needed for nausea or vomiting.   sodium chloride 1 g tablet Take 1 g by mouth in the morning.   valsartan (DIOVAN) 80 MG tablet Take 0.5  tablets (40 mg total) by mouth daily.   Vibegron (GEMTESA) 75 MG TABS Take 32.5 mg by mouth at bedtime.   vitamin B-12 (CYANOCOBALAMIN) 1000 MCG tablet Take 1,000 mcg by mouth in the morning.   No facility-administered encounter medications on file as of 02/22/2022.    Review of Systems  Constitutional:  Positive for activity change. Negative for appetite change, chills, diaphoresis, fatigue, fever and unexpected weight change.  HENT:  Negative for congestion.   Respiratory:  Negative for cough, shortness of breath and wheezing.   Cardiovascular:  Negative for chest pain, palpitations and leg swelling.  Gastrointestinal:  Negative for abdominal distention, abdominal pain, constipation and diarrhea.  Genitourinary:  Negative for difficulty urinating and dysuria.  Musculoskeletal:  Positive for gait problem and joint swelling. Negative for arthralgias, back pain and myalgias.       Right lower ext in cast   Neurological:  Negative for dizziness, tremors, seizures, syncope, facial asymmetry, speech difficulty, weakness, light-headedness, numbness and headaches.  Psychiatric/Behavioral:  Negative for agitation, behavioral problems and confusion.        Memory loss    Immunization History  Administered Date(s) Administered   Fluad Quad(high Dose 65+) 01/05/2019, 12/07/2021   H1N1 04/07/2008   Influenza Whole 12/29/2008, 12/22/2009   Influenza, High Dose Seasonal PF  01/15/2012, 12/23/2014, 01/22/2020   Influenza,inj,Quad PF,6+ Mos 01/22/2014, 12/10/2016, 01/17/2018   Influenza,trivalent, recombinat, inj, PF 01/05/2016   Influenza-Unspecified 04/07/2008, 12/15/2014, 01/13/2021   Moderna Covid-19 Vaccine Bivalent Booster 8yr & up 01/29/2022   Moderna Sars-Covid-2 Vaccination 04/07/2019, 05/05/2019, 02/09/2020   PFIZER(Purple Top)SARS-COV-2 Vaccination 01/06/2021   PPD Test 07/17/2010, 04/19/2015, 04/25/2015   Pneumococcal Conjugate-13 11/10/2013   Pneumococcal Polysaccharide-23 12/27/2010, 12/05/2016   Tdap 04/15/2017   Zoster Recombinat (Shingrix) 04/09/2017, 06/18/2017   Pertinent  Health Maintenance Due  Topic Date Due   INFLUENZA VACCINE  Completed   DEXA SCAN  Completed      12/21/2021   10:22 AM 12/21/2021   10:00 PM 12/23/2021    8:08 AM 01/01/2022   11:05 AM 01/04/2022   10:00 AM  FNorcoin the past year?    1   Was there an injury with Fall?    1   Fall Risk Category Calculator    3   Fall Risk Category    High   Patient Fall Risk Level Moderate fall risk High fall risk High fall risk High fall risk High fall risk   Functional Status Survey:    Vitals:   02/23/22 0831  BP: 132/80  Pulse: 70  Resp: 16  Temp: 97.7 F (36.5 C)  Weight: 123 lb (55.8 kg)   Body mass index is 22.5 kg/m. Physical Exam Vitals and nursing note reviewed.  Constitutional:      General: She is not in acute distress.    Appearance: She is not diaphoretic.  HENT:     Head: Normocephalic and atraumatic.  Neck:     Vascular: No JVD.  Cardiovascular:     Rate and Rhythm: Normal rate. Rhythm irregular.     Heart sounds: No murmur heard. Pulmonary:     Effort: Pulmonary effort is normal. No respiratory distress.     Breath sounds: Normal breath sounds. No wheezing.  Musculoskeletal:     Right lower leg: Edema (noted to right toes, ext covered in cast. +CMS. No redness or pain) present.     Left lower leg: No edema.  Skin:    General:  Skin is  warm and dry.  Neurological:     Mental Status: She is alert and oriented to person, place, and time.     Labs reviewed: Recent Labs    12/07/21 0548 12/20/21 2238 12/22/21 0924 12/29/21 0000 01/30/22 1809 02/03/22 0000  NA 137 133* 134* 135* 132* 133*  K 4.2 4.4 4.4 4.5 4.3 4.5  CL 104 99 104 99 100 98*  CO2 '23 24 22 '$ 23* 22 26*  GLUCOSE 90 106* 91  --  116*  --   BUN '15 16 14 13 22 15  '$ CREATININE 0.79 0.78 0.85 0.9 0.88 0.7  CALCIUM 9.1 9.3 8.9 9.1 9.4 8.9  MG 1.6*  --   --   --  1.9  --    Recent Labs    07/25/21 0945 08/22/21 1134 10/03/21 1055  AST '20 22 22  '$ ALT '18 20 18  '$ ALKPHOS 86 84 75  BILITOT 0.6 0.6 0.8  PROT 6.8 7.5 7.1  ALBUMIN 4.3 4.5 4.5   Recent Labs    12/07/21 0548 12/13/21 0000 12/22/21 0924 12/29/21 0000 01/04/22 0937 01/06/22 0000 01/30/22 1809 02/03/22 0000 02/08/22 0000  WBC 7.2   < > 8.6   < > 8.1   < > 7.6 6.9 6.9  NEUTROABS 4.0  --   --   --  4.7  --  4.9  --   --   HGB 8.7*   < > 8.1*   < > 9.7*   < > 11.2* 9.7* 10.2*  HCT 25.0*   < > 24.8*   < > 29.8*   < > 33.8* 29* 31*  MCV 98.4   < > 108.8*  --  111.2*  --  106.6*  --   --   PLT 141*   < > 206  --  258  --  194 184 282   < > = values in this interval not displayed.   Lab Results  Component Value Date   TSH 22.70 (A) 01/06/2022   Lab Results  Component Value Date   HGBA1C 5.5 01/12/2013   Lab Results  Component Value Date   CHOL 115 02/02/2021   HDL 48 02/02/2021   LDLCALC 49 02/02/2021   TRIG 91 02/02/2021    Significant Diagnostic Results in last 30 days:  DG Knee Complete 4 Views Right  Result Date: 01/30/2022 CLINICAL DATA:  Fall. EXAM: RIGHT KNEE - COMPLETE 4+ VIEW COMPARISON:  None Available. FINDINGS: No evidence of acute fracture or dislocation. Mild-to-moderate tricompartmental degenerative changes are noted and most pronounced in the lateral and patellofemoral compartments. There is a small to moderate suprapatellar joint effusion. Vascular  calcifications are present in the soft tissues. IMPRESSION: 1. No acute fracture or dislocation. 2. Small to moderate suprapatellar joint effusion. 3. Tricompartmental degenerative changes. Electronically Signed   By: Brett Fairy M.D.   On: 01/30/2022 23:58   DG Ankle Complete Right  Result Date: 01/30/2022 CLINICAL DATA:  Fall EXAM: RIGHT ANKLE - COMPLETE 3+ VIEW COMPARISON:  None Available. FINDINGS: Extensive soft tissue swelling. Acute fracture involving the distal fibula with about 1/2 shaft diameter posterior and 1/4 shaft diameter lateral displacement of distal fracture fragment. Possible small displaced fracture fragment off the posterior malleolus. Suspect nondisplaced medial malleolar fracture is well. Vascular calcification. Tiny plantar calcaneal spur. Mortise symmetric. IMPRESSION: 1. Acute displaced distal fibular fracture. Suspect nondisplaced medial malleolar fracture. 2. Possible small displaced posterior malleolar fracture. Electronically Signed   By: Madie Reno.D.  On: 01/30/2022 18:48    Assessment/Plan 1. Essential hypertension Increase Diovan to 60 mg daily Monitor bp daily x 1 week and report BP logs  2. Hyponatremia On 1 gram sodium daily NA 132-133 Due to borderline low numbers will keep dose the same.   3. Permanent atrial fibrillation (HCC) Rate is controlled on Toprol XL ON asa only for CVA risk reduction due to hx of bleeding and falls.     Cindi Carbon, ANP [facility] [phone]

## 2022-02-26 DIAGNOSIS — N3000 Acute cystitis without hematuria: Secondary | ICD-10-CM | POA: Diagnosis not present

## 2022-02-26 DIAGNOSIS — G3184 Mild cognitive impairment, so stated: Secondary | ICD-10-CM | POA: Diagnosis not present

## 2022-02-26 DIAGNOSIS — R296 Repeated falls: Secondary | ICD-10-CM | POA: Diagnosis not present

## 2022-02-26 DIAGNOSIS — M24871 Other specific joint derangements of right ankle, not elsewhere classified: Secondary | ICD-10-CM | POA: Diagnosis not present

## 2022-02-26 DIAGNOSIS — R278 Other lack of coordination: Secondary | ICD-10-CM | POA: Diagnosis not present

## 2022-02-26 DIAGNOSIS — M6289 Other specified disorders of muscle: Secondary | ICD-10-CM | POA: Diagnosis not present

## 2022-02-27 DIAGNOSIS — R278 Other lack of coordination: Secondary | ICD-10-CM | POA: Diagnosis not present

## 2022-02-27 DIAGNOSIS — G3184 Mild cognitive impairment, so stated: Secondary | ICD-10-CM | POA: Diagnosis not present

## 2022-02-27 DIAGNOSIS — M6289 Other specified disorders of muscle: Secondary | ICD-10-CM | POA: Diagnosis not present

## 2022-02-27 DIAGNOSIS — R296 Repeated falls: Secondary | ICD-10-CM | POA: Diagnosis not present

## 2022-02-27 DIAGNOSIS — M24871 Other specific joint derangements of right ankle, not elsewhere classified: Secondary | ICD-10-CM | POA: Diagnosis not present

## 2022-02-27 DIAGNOSIS — N3000 Acute cystitis without hematuria: Secondary | ICD-10-CM | POA: Diagnosis not present

## 2022-02-28 DIAGNOSIS — M6289 Other specified disorders of muscle: Secondary | ICD-10-CM | POA: Diagnosis not present

## 2022-02-28 DIAGNOSIS — R296 Repeated falls: Secondary | ICD-10-CM | POA: Diagnosis not present

## 2022-02-28 DIAGNOSIS — N3000 Acute cystitis without hematuria: Secondary | ICD-10-CM | POA: Diagnosis not present

## 2022-02-28 DIAGNOSIS — R278 Other lack of coordination: Secondary | ICD-10-CM | POA: Diagnosis not present

## 2022-02-28 DIAGNOSIS — M24871 Other specific joint derangements of right ankle, not elsewhere classified: Secondary | ICD-10-CM | POA: Diagnosis not present

## 2022-02-28 DIAGNOSIS — G3184 Mild cognitive impairment, so stated: Secondary | ICD-10-CM | POA: Diagnosis not present

## 2022-03-01 ENCOUNTER — Telehealth: Payer: Self-pay | Admitting: Nurse Practitioner

## 2022-03-01 DIAGNOSIS — N3000 Acute cystitis without hematuria: Secondary | ICD-10-CM | POA: Diagnosis not present

## 2022-03-01 DIAGNOSIS — R278 Other lack of coordination: Secondary | ICD-10-CM | POA: Diagnosis not present

## 2022-03-01 DIAGNOSIS — M24871 Other specific joint derangements of right ankle, not elsewhere classified: Secondary | ICD-10-CM | POA: Diagnosis not present

## 2022-03-01 DIAGNOSIS — M6289 Other specified disorders of muscle: Secondary | ICD-10-CM | POA: Diagnosis not present

## 2022-03-01 DIAGNOSIS — G3184 Mild cognitive impairment, so stated: Secondary | ICD-10-CM | POA: Diagnosis not present

## 2022-03-01 DIAGNOSIS — R296 Repeated falls: Secondary | ICD-10-CM | POA: Diagnosis not present

## 2022-03-01 NOTE — Telephone Encounter (Signed)
Reported BRBPR x2, cranberry colored in commode, elevated Bp, just ready to give Clonidine, no noted abd or rectal pain, nausea, vomiting, afebrile. Will hold ASA, obtain CBC/diff, monitor Vitals, may ED eval if BRBPR again.

## 2022-03-02 ENCOUNTER — Telehealth: Payer: Self-pay | Admitting: *Deleted

## 2022-03-02 ENCOUNTER — Encounter: Payer: Self-pay | Admitting: Oncology

## 2022-03-02 ENCOUNTER — Non-Acute Institutional Stay (SKILLED_NURSING_FACILITY): Payer: Medicare Other | Admitting: Adult Health

## 2022-03-02 ENCOUNTER — Encounter: Payer: Self-pay | Admitting: Adult Health

## 2022-03-02 ENCOUNTER — Encounter: Payer: Self-pay | Admitting: Gastroenterology

## 2022-03-02 DIAGNOSIS — D500 Iron deficiency anemia secondary to blood loss (chronic): Secondary | ICD-10-CM

## 2022-03-02 DIAGNOSIS — D509 Iron deficiency anemia, unspecified: Secondary | ICD-10-CM | POA: Diagnosis not present

## 2022-03-02 DIAGNOSIS — K625 Hemorrhage of anus and rectum: Secondary | ICD-10-CM

## 2022-03-02 DIAGNOSIS — I10 Essential (primary) hypertension: Secondary | ICD-10-CM

## 2022-03-02 LAB — CBC AND DIFFERENTIAL
HCT: 28 — AB (ref 36–46)
Hemoglobin: 9.9 — AB (ref 12.0–16.0)
Platelets: 169 10*3/uL (ref 150–400)
WBC: 5.3

## 2022-03-02 LAB — CBC: RBC: 2.75 — AB (ref 3.87–5.11)

## 2022-03-02 NOTE — Progress Notes (Unsigned)
Location:  [facility] Room Number: 152A Place of Service:  SNF [phone]) Provider:  Royal Hawthorn, NP  Virgie Dad, MD  Patient Care Team: Virgie Dad, MD as PCP - General (Internal Medicine) Debara Pickett Nadean Corwin, MD as PCP - Cardiology (Cardiology) Pieter Partridge, DO as Consulting Physician (Neurology)  Extended Emergency Contact Information Primary Emergency Contact: [name],[name] Address: [address]          [place], [place] [address] Johnnette Litter of [place] Phone: [phone] Mobile Phone: [phone] Relation: Daughter Secondary Emergency Contact: Eula Listen Address: [address]          [place], [place] [address] Johnnette Litter of [place] Mobile Phone: [phone] Relation: Son  Code Status:  DNR Goals of care: Advanced Directive information    03/02/2022   10:15 AM  Advanced Directives  Does Patient Have a Medical Advance Directive? Yes  Type of Paramedic of [place];Out of facility DNR (pink MOST or yellow form);Living will  Does patient want to make changes to medical advance directive? No - Patient declined  Copy of [place] in Chart? Yes - validated most recent copy scanned in chart (See row information)     Chief Complaint  Patient presents with   Acute Visit    Blood in stool   Quality Metric Gaps    Discuss medicare annual wellness    HPI:  Pt is a 88 y.o. female seen today for an acute visit for blood in stool.   PMH: hx of colon ca in 2006 with recurrence. Noted by colonoscopy at [facility] 02/22/21, bx showed well differentiated adenocarcinoma. CT in Jan of 2023 showed no residual disease. Had 4 cycles of pembrolizumab. Colonoscopy 09/20/21 showed no residual mass.    She is currently in rehab for a right ankle fracture.   Nurse reports that last evening she had blood in and around the stool and this repeated x 1 today. Turning the toilet water a cranberry  color. She denies any rectal discomfort or abd pain. BP is running on the high side. She is noted to have an internal hemorrhoid on last colonoscopy.  She was on aspirin which is now being held.   BP is variable from 503-546 systolic.   Past Medical History:  Diagnosis Date   Allergy    seasonal   Anemia    Anxiety    Arthritis    Back   Asymptomatic menopausal state    Atrial fibrillation ([facility])    Cancer ([facility]) 2006   Colon.  Basal Cell Skin cancer- right arm   Cataract    removed bilateral   Chronic atrial fibrillation (HCC)    Colon cancer (HCC)    Constipation due to pain medication therapy    after heart surgery   Deficiency of other specified B group vitamins    Diplopia    Disorientation, unspecified    Dysrhythmia    PAF   GERD (gastroesophageal reflux disease)    Heart murmur    Hyperlipidemia    Hypertension    Hypothyroidism    Malignant neoplasm of colon, unspecified ([facility])    Other specified diseases of blood and blood-forming organs    Other specified disorders of bone density and structure, unspecified forearm    Other thrombophilia ([facility])    per matrix   Overactive bladder    per Matrix   Personal history of other malignant neoplasm of large intestine    Presbycusis, bilateral  per matrix   RA (rheumatoid arthritis) ([facility])    Restless leg    Seizures (HCC)    after Heart Surgey   Stroke ([place])    TIA- found by neurologist after    Valgus deformity, not elsewhere classified, right knee    per matrix   Vascular dementia, mild, without behavioral disturbance, psychotic disturbance, mood disturbance, and anxiety ([facility])    per matrix   Past Surgical History:  Procedure Laterality Date   ABDOMINAL HYSTERECTOMY  1970   Partial    COLON RESECTION  2006   cancer   COLON SURGERY     COLONOSCOPY     EYE SURGERY Bilateral    Cataract   MAXIMUM ACCESS (MAS)POSTERIOR LUMBAR INTERBODY FUSION (PLIF) 2 LEVEL N/A 04/12/2015   Procedure: Lumbar Three-Five  Decompression, Pedicle Screw Fixation, and Posteriolateral Arthrodesis;  Surgeon: Erline Levine, MD;  Location: [facility];  Service: Neurosurgery;  Laterality: N/A;  L3-4 L4-5 Maximum access posterior lumbar fusion, possible interbodies and resection of synovial cyst at L4-5   MITRAL VALVE REPAIR  01/20/2013   Gore-tex cords to P1, P2, and P3. Magic suture to posterior medial commisure, #30 Physio 1 ring. Done in [place]   TONSILLECTOMY     about [place]  01/20/2013   #28 TriAd ring done in [place]    Allergies  Allergen Reactions   Claritin [Loratadine] Other (See Comments)    Listed on MAR Unknown reaction   Codeine Other (See Comments)    "just don't take it well"   Gabapentin Other (See Comments)    Dizziness    Molds & Smuts     dust   Pollen Extract Swelling    Grass   Bee Venom Swelling and Rash   Wasp Venom Swelling and Rash    Outpatient Encounter Medications as of 03/02/2022  Medication Sig   acetaminophen (TYLENOL) 500 MG tablet Take 500 mg by mouth 2 (two) times daily as needed for mild pain. 1,000 mg, oral, As Needed, Tylenol 1,000 mg BID PRN   aspirin EC 81 MG tablet Take 1 tablet (81 mg total) by mouth daily. Swallow whole.   atorvastatin (LIPITOR) 20 MG tablet TAKE 1 TABLET DAILY (Patient taking differently: Take 20 mg by mouth at bedtime.)   Cholecalciferol (VITAMIN D) 50 MCG (2000 UT) CAPS Take 2,000 Units by mouth in the morning.   cloNIDine (CATAPRES) 0.1 MG tablet Take 1 tablet (0.1 mg total) by mouth daily as needed. If SBP >180 only   digoxin (LANOXIN) 0.125 MG tablet TAKE 1 TABLET DAILY   leflunomide (ARAVA) 20 MG tablet Take 20 mg by mouth in the morning.   levalbuterol (XOPENEX) 0.63 MG/3ML nebulizer solution Take 3 mLs (0.63 mg total) by nebulization in the morning and at bedtime.   levothyroxine (SYNTHROID) 100 MCG tablet Take 1 tablet (100 mcg total) by mouth daily before breakfast.   metoprolol succinate (TOPROL-XL) 50 MG 24  hr tablet TAKE 1 TABLET DAILY   mirtazapine (REMERON) 15 MG tablet TAKE 1 TABLET AT BEDTIME   Multiple Vitamin (MULTIVITAMIN) tablet Take 1 tablet by mouth in the morning.   Multiple Vitamins-Minerals (PRESERVISION AREDS 2) CAPS Take 1 capsule by mouth 2 (two) times daily.   omeprazole (PRILOSEC) 40 MG capsule TAKE 1 CAPSULE DAILY   ondansetron (ZOFRAN) 4 MG tablet Take 4 mg by mouth every 6 (six) hours as needed for nausea or vomiting.   sodium chloride 1 g tablet Take 1 g  by mouth in the morning.   valsartan (DIOVAN) 160 MG tablet Take 0.5 tablets (80 mg total) by mouth daily.   Vibegron (GEMTESA) 75 MG TABS Take 32.5 mg by mouth at bedtime.   vitamin B-12 (CYANOCOBALAMIN) 1000 MCG tablet Take 1,000 mcg by mouth in the morning.   [DISCONTINUED] ferrous sulfate 325 (65 FE) MG tablet Take 325 mg by mouth daily with breakfast.   No facility-administered encounter medications on file as of 03/02/2022.    Review of Systems  Constitutional:  Positive for activity change (due to fracture). Negative for appetite change, chills, diaphoresis, fatigue, fever and unexpected weight change.  HENT:  Negative for congestion.   Eyes:  Negative for redness.  Respiratory:  Negative for cough, shortness of breath and wheezing.   Cardiovascular:  Positive for leg swelling. Negative for chest pain and palpitations.  Gastrointestinal:  Positive for anal bleeding and blood in stool. Negative for abdominal distention, abdominal pain, constipation, diarrhea, nausea, rectal pain and vomiting.  Genitourinary:  Negative for difficulty urinating and dysuria.  Musculoskeletal:  Positive for gait problem. Negative for arthralgias, back pain, joint swelling and myalgias.  Neurological:  Negative for dizziness, tremors, seizures, syncope, facial asymmetry, speech difficulty, weakness, light-headedness, numbness and headaches.  Psychiatric/Behavioral:  Negative for agitation, behavioral problems and confusion.        Memory  loss    Immunization History  Administered Date(s) Administered   Fluad Quad(high Dose 65+) 01/05/2019, 12/07/2021   H1N1 04/07/2008   Influenza Whole 12/29/2008, 12/22/2009   Influenza, High Dose Seasonal PF 01/15/2012, 12/23/2014, 01/22/2020   Influenza,inj,Quad PF,6+ Mos 01/22/2014, 12/10/2016, 01/17/2018   Influenza,trivalent, recombinat, inj, PF 01/05/2016   Influenza-Unspecified 04/07/2008, 12/15/2014, 01/13/2021   Moderna Covid-19 Vaccine Bivalent Booster 62yr & up 01/29/2022   Moderna Sars-Covid-2 Vaccination 04/07/2019, 05/05/2019, 02/09/2020   PFIZER(Purple Top)SARS-COV-2 Vaccination 01/06/2021   PPD Test 07/17/2010, 04/19/2015, 04/25/2015   Pneumococcal Conjugate-13 11/10/2013   Pneumococcal Polysaccharide-23 12/27/2010, 12/05/2016   Tdap 04/15/2017   Zoster Recombinat (Shingrix) 04/09/2017, 06/18/2017   Pertinent  Health Maintenance Due  Topic Date Due   INFLUENZA VACCINE  Completed   DEXA SCAN  Completed      12/21/2021   10:22 AM 12/21/2021   10:00 PM 12/23/2021    8:08 AM 01/01/2022   11:05 AM 01/04/2022   10:00 AM  FBeverlyin the past year?    1   Was there an injury with Fall?    1   Fall Risk Category Calculator    3   Fall Risk Category    High   Patient Fall Risk Level Moderate fall risk High fall risk High fall risk High fall risk High fall risk   Functional Status Survey:    Vitals:   03/02/22 1006  BP: (!) 115/58  Pulse: 69  Resp: 12  Temp: 98 F (36.7 C)  TempSrc: Temporal  SpO2: 97%  Weight: 122 lb 6.4 oz (55.5 kg)  Height: '5\' 2"'$  (1.575 m)   Body mass index is 22.39 kg/m. Physical Exam Vitals and nursing note reviewed. Exam conducted with a chaperone present.  Constitutional:      General: She is not in acute distress.    Appearance: She is not diaphoretic.  HENT:     Head: Normocephalic and atraumatic.  Neck:     Vascular: No JVD.  Cardiovascular:     Rate and Rhythm: Normal rate. Rhythm irregular.     Heart sounds:  No murmur heard. Pulmonary:  Effort: Pulmonary effort is normal. No respiratory distress.     Breath sounds: Normal breath sounds. No wheezing.  Abdominal:     General: Bowel sounds are normal. There is no distension.     Palpations: Abdomen is soft. There is no hepatomegaly, splenomegaly or mass.     Tenderness: There is no abdominal tenderness. There is no guarding.     Hernia: No hernia is present.  Genitourinary:    Rectum: External hemorrhoid present. No mass, tenderness, anal fissure or internal hemorrhoid. Normal anal tone.  Skin:    General: Skin is warm and dry.  Neurological:     Mental Status: She is alert and oriented to person, place, and time.     Labs reviewed: Recent Labs    12/07/21 0548 12/20/21 2238 12/22/21 0924 12/29/21 0000 01/30/22 1809 02/03/22 0000  NA 137 133* 134* 135* 132* 133*  K 4.2 4.4 4.4 4.5 4.3 4.5  CL 104 99 104 99 100 98*  CO2 '23 24 22 '$ 23* 22 26*  GLUCOSE 90 106* 91  --  116*  --   BUN '15 16 14 13 22 15  '$ CREATININE 0.79 0.78 0.85 0.9 0.88 0.7  CALCIUM 9.1 9.3 8.9 9.1 9.4 8.9  MG 1.6*  --   --   --  1.9  --    Recent Labs    07/25/21 0945 08/22/21 1134 10/03/21 1055  AST '20 22 22  '$ ALT '18 20 18  '$ ALKPHOS 86 84 75  BILITOT 0.6 0.6 0.8  PROT 6.8 7.5 7.1  ALBUMIN 4.3 4.5 4.5   Recent Labs    12/07/21 0548 12/13/21 0000 12/22/21 0924 12/29/21 0000 01/04/22 0937 01/06/22 0000 01/30/22 1809 02/03/22 0000 02/08/22 0000  WBC 7.2   < > 8.6   < > 8.1   < > 7.6 6.9 6.9  NEUTROABS 4.0  --   --   --  4.7  --  4.9  --   --   HGB 8.7*   < > 8.1*   < > 9.7*   < > 11.2* 9.7* 10.2*  HCT 25.0*   < > 24.8*   < > 29.8*   < > 33.8* 29* 31*  MCV 98.4   < > 108.8*  --  111.2*  --  106.6*  --   --   PLT 141*   < > 206  --  258  --  194 184 282   < > = values in this interval not displayed.   Lab Results  Component Value Date   TSH 22.70 (A) 01/06/2022   Lab Results  Component Value Date   HGBA1C 5.5 01/12/2013   Lab Results   Component Value Date   CHOL 115 02/02/2021   HDL 48 02/02/2021   LDLCALC 49 02/02/2021   TRIG 91 02/02/2021    Significant Diagnostic Results in last 30 days:  No results found.  Assessment/Plan  1. Rectal bleeding Blood noted in and around the stool with no pain. On exam I was not able to palpate a hemorrhoid internally but this is noted on colonoscopy. She does have an external hemorrhoid but it does not appear to be inflamed. Will order hemorrhoid supp for 7 days and refer back to GI.  If worsening send to the ED. BP is on the high side, she is in no acute distress at this time.   2. Iron deficiency anemia due to chronic blood loss Hgb returned 12/8 at 9.9 which is in  keeping with her baseline. Will repeat on Monday 12/11.  Continue iron as ordered.   3. Essential hypertension BP is periodically still elevated but given that she is having some bleeding will not increase meds at this time. She has prn clonidine for SBP>180   Family/ staff Communication: Discussed her care with Hosie Spangle her daughter. Ms. Emory did not tolerate prior cancer treatment and it impacted her QOL. Given her age, dementia, and other co morbid conditions we would be hesitant to pursue any aggressive measures if this is a cancer recurrence. We agreed to try the hemorrhoid supp, contact GI Dr Fuller Plan, and if worsening bleeding send to the ED.   Labs/tests ordered:  CBC 12/11

## 2022-03-02 NOTE — Telephone Encounter (Signed)
Received MyChart message from daughter: Mrs. Kathy Velez reported bright red blood in stool yesterday when she had a BM-turned toilet water color of cranberry juice. Her stools are usually very soft. Reports last colonoscopy showed internal hemorrhoids. NP with [facility] just sent in script today for rectal suppository and she will start today. Instructed daughter to call back Monday if the bleeding persists.

## 2022-03-03 ENCOUNTER — Encounter: Payer: Self-pay | Admitting: Adult Health

## 2022-03-03 MED ORDER — HYDROCORTISONE ACETATE 25 MG RE SUPP: 25.0000 mg | Freq: Every day | RECTAL | 0 refills | Status: DC

## 2022-03-03 MED ORDER — IRON (FERROUS SULFATE) 325 (65 FE) MG PO TABS: 325.0000 mg | ORAL_TABLET | Freq: Every day | ORAL | 0 refills | Status: AC

## 2022-03-04 ENCOUNTER — Telehealth: Payer: Self-pay | Admitting: Family

## 2022-03-04 NOTE — Telephone Encounter (Signed)
[name] facility nurse called to report patient has had multiple fall episodes.Requests orders for BMP and urine analysis.  CBC/diff and BMP, UA and C/S ordered please follow-up

## 2022-03-05 DIAGNOSIS — M6289 Other specified disorders of muscle: Secondary | ICD-10-CM | POA: Diagnosis not present

## 2022-03-05 DIAGNOSIS — N3000 Acute cystitis without hematuria: Secondary | ICD-10-CM | POA: Diagnosis not present

## 2022-03-05 DIAGNOSIS — M24871 Other specific joint derangements of right ankle, not elsewhere classified: Secondary | ICD-10-CM | POA: Diagnosis not present

## 2022-03-05 DIAGNOSIS — N390 Urinary tract infection, site not specified: Secondary | ICD-10-CM | POA: Diagnosis not present

## 2022-03-05 DIAGNOSIS — R195 Other fecal abnormalities: Secondary | ICD-10-CM | POA: Diagnosis not present

## 2022-03-05 DIAGNOSIS — R296 Repeated falls: Secondary | ICD-10-CM | POA: Diagnosis not present

## 2022-03-05 DIAGNOSIS — K920 Hematemesis: Secondary | ICD-10-CM | POA: Diagnosis not present

## 2022-03-05 DIAGNOSIS — R278 Other lack of coordination: Secondary | ICD-10-CM | POA: Diagnosis not present

## 2022-03-05 DIAGNOSIS — G3184 Mild cognitive impairment, so stated: Secondary | ICD-10-CM | POA: Diagnosis not present

## 2022-03-05 DIAGNOSIS — E871 Hypo-osmolality and hyponatremia: Secondary | ICD-10-CM | POA: Diagnosis not present

## 2022-03-06 ENCOUNTER — Other Ambulatory Visit: Payer: Self-pay

## 2022-03-06 ENCOUNTER — Encounter: Payer: Medicare Other | Admitting: Internal Medicine

## 2022-03-06 ENCOUNTER — Emergency Department (HOSPITAL_COMMUNITY): Payer: Medicare Other

## 2022-03-06 ENCOUNTER — Inpatient Hospital Stay (HOSPITAL_COMMUNITY)
Admission: EM | Admit: 2022-03-06 | Discharge: 2022-03-10 | DRG: 378 | Disposition: A | Discharge status: Skilled Nursing Facility | Payer: Medicare Other | Source: Skilled Nursing Facility | Attending: Internal Medicine | Admitting: Internal Medicine

## 2022-03-06 DIAGNOSIS — Z66 Do not resuscitate: Secondary | ICD-10-CM | POA: Diagnosis present

## 2022-03-06 DIAGNOSIS — N390 Urinary tract infection, site not specified: Secondary | ICD-10-CM | POA: Diagnosis present

## 2022-03-06 DIAGNOSIS — Z7401 Bed confinement status: Secondary | ICD-10-CM | POA: Diagnosis not present

## 2022-03-06 DIAGNOSIS — E871 Hypo-osmolality and hyponatremia: Secondary | ICD-10-CM | POA: Diagnosis present

## 2022-03-06 DIAGNOSIS — R58 Hemorrhage, not elsewhere classified: Secondary | ICD-10-CM | POA: Diagnosis not present

## 2022-03-06 DIAGNOSIS — G2581 Restless legs syndrome: Secondary | ICD-10-CM | POA: Diagnosis present

## 2022-03-06 DIAGNOSIS — Z85038 Personal history of other malignant neoplasm of large intestine: Secondary | ICD-10-CM

## 2022-03-06 DIAGNOSIS — K922 Gastrointestinal hemorrhage, unspecified: Secondary | ICD-10-CM | POA: Diagnosis not present

## 2022-03-06 DIAGNOSIS — W19XXXD Unspecified fall, subsequent encounter: Secondary | ICD-10-CM | POA: Diagnosis present

## 2022-03-06 DIAGNOSIS — N281 Cyst of kidney, acquired: Secondary | ICD-10-CM | POA: Diagnosis not present

## 2022-03-06 DIAGNOSIS — D62 Acute posthemorrhagic anemia: Secondary | ICD-10-CM | POA: Diagnosis not present

## 2022-03-06 DIAGNOSIS — F0150 Vascular dementia without behavioral disturbance: Secondary | ICD-10-CM | POA: Diagnosis present

## 2022-03-06 DIAGNOSIS — I480 Paroxysmal atrial fibrillation: Secondary | ICD-10-CM | POA: Diagnosis present

## 2022-03-06 DIAGNOSIS — K5731 Diverticulosis of large intestine without perforation or abscess with bleeding: Secondary | ICD-10-CM | POA: Diagnosis not present

## 2022-03-06 DIAGNOSIS — K920 Hematemesis: Secondary | ICD-10-CM | POA: Diagnosis not present

## 2022-03-06 DIAGNOSIS — Z515 Encounter for palliative care: Secondary | ICD-10-CM

## 2022-03-06 DIAGNOSIS — Z7189 Other specified counseling: Secondary | ICD-10-CM | POA: Diagnosis not present

## 2022-03-06 DIAGNOSIS — E782 Mixed hyperlipidemia: Secondary | ICD-10-CM | POA: Diagnosis present

## 2022-03-06 DIAGNOSIS — S82831D Other fracture of upper and lower end of right fibula, subsequent encounter for closed fracture with routine healing: Secondary | ICD-10-CM | POA: Diagnosis not present

## 2022-03-06 DIAGNOSIS — K625 Hemorrhage of anus and rectum: Principal | ICD-10-CM

## 2022-03-06 DIAGNOSIS — Z8673 Personal history of transient ischemic attack (TIA), and cerebral infarction without residual deficits: Secondary | ICD-10-CM | POA: Diagnosis not present

## 2022-03-06 DIAGNOSIS — M069 Rheumatoid arthritis, unspecified: Secondary | ICD-10-CM | POA: Diagnosis present

## 2022-03-06 DIAGNOSIS — I4821 Permanent atrial fibrillation: Secondary | ICD-10-CM | POA: Diagnosis present

## 2022-03-06 DIAGNOSIS — Z91038 Other insect allergy status: Secondary | ICD-10-CM

## 2022-03-06 DIAGNOSIS — Z79899 Other long term (current) drug therapy: Secondary | ICD-10-CM

## 2022-03-06 DIAGNOSIS — D539 Nutritional anemia, unspecified: Secondary | ICD-10-CM | POA: Diagnosis present

## 2022-03-06 DIAGNOSIS — K5730 Diverticulosis of large intestine without perforation or abscess without bleeding: Secondary | ICD-10-CM | POA: Diagnosis not present

## 2022-03-06 DIAGNOSIS — E861 Hypovolemia: Secondary | ICD-10-CM | POA: Diagnosis present

## 2022-03-06 DIAGNOSIS — I10 Essential (primary) hypertension: Secondary | ICD-10-CM | POA: Diagnosis not present

## 2022-03-06 DIAGNOSIS — L602 Onychogryphosis: Secondary | ICD-10-CM | POA: Diagnosis not present

## 2022-03-06 DIAGNOSIS — Z8249 Family history of ischemic heart disease and other diseases of the circulatory system: Secondary | ICD-10-CM | POA: Diagnosis not present

## 2022-03-06 DIAGNOSIS — K219 Gastro-esophageal reflux disease without esophagitis: Secondary | ICD-10-CM | POA: Diagnosis present

## 2022-03-06 DIAGNOSIS — Z888 Allergy status to other drugs, medicaments and biological substances status: Secondary | ICD-10-CM | POA: Diagnosis not present

## 2022-03-06 DIAGNOSIS — R278 Other lack of coordination: Secondary | ICD-10-CM | POA: Diagnosis not present

## 2022-03-06 DIAGNOSIS — Z885 Allergy status to narcotic agent status: Secondary | ICD-10-CM

## 2022-03-06 DIAGNOSIS — B961 Klebsiella pneumoniae [K. pneumoniae] as the cause of diseases classified elsewhere: Secondary | ICD-10-CM | POA: Diagnosis present

## 2022-03-06 DIAGNOSIS — R1111 Vomiting without nausea: Secondary | ICD-10-CM | POA: Diagnosis not present

## 2022-03-06 DIAGNOSIS — R296 Repeated falls: Secondary | ICD-10-CM | POA: Diagnosis not present

## 2022-03-06 DIAGNOSIS — M6289 Other specified disorders of muscle: Secondary | ICD-10-CM | POA: Diagnosis not present

## 2022-03-06 DIAGNOSIS — C189 Malignant neoplasm of colon, unspecified: Secondary | ICD-10-CM | POA: Diagnosis not present

## 2022-03-06 DIAGNOSIS — Z7901 Long term (current) use of anticoagulants: Secondary | ICD-10-CM

## 2022-03-06 DIAGNOSIS — Z91030 Bee allergy status: Secondary | ICD-10-CM

## 2022-03-06 DIAGNOSIS — D649 Anemia, unspecified: Secondary | ICD-10-CM | POA: Diagnosis not present

## 2022-03-06 DIAGNOSIS — G3184 Mild cognitive impairment, so stated: Secondary | ICD-10-CM | POA: Diagnosis not present

## 2022-03-06 DIAGNOSIS — K5790 Diverticulosis of intestine, part unspecified, without perforation or abscess without bleeding: Secondary | ICD-10-CM | POA: Diagnosis not present

## 2022-03-06 DIAGNOSIS — Z79890 Hormone replacement therapy (postmenopausal): Secondary | ICD-10-CM

## 2022-03-06 DIAGNOSIS — Z83719 Family history of colon polyps, unspecified: Secondary | ICD-10-CM

## 2022-03-06 DIAGNOSIS — E039 Hypothyroidism, unspecified: Secondary | ICD-10-CM | POA: Diagnosis present

## 2022-03-06 DIAGNOSIS — M24871 Other specific joint derangements of right ankle, not elsewhere classified: Secondary | ICD-10-CM | POA: Diagnosis not present

## 2022-03-06 DIAGNOSIS — R531 Weakness: Secondary | ICD-10-CM | POA: Diagnosis not present

## 2022-03-06 DIAGNOSIS — N3000 Acute cystitis without hematuria: Secondary | ICD-10-CM | POA: Diagnosis not present

## 2022-03-06 DIAGNOSIS — Z9181 History of falling: Secondary | ICD-10-CM

## 2022-03-06 DIAGNOSIS — C185 Malignant neoplasm of splenic flexure: Secondary | ICD-10-CM | POA: Diagnosis not present

## 2022-03-06 LAB — URINALYSIS, ROUTINE W REFLEX MICROSCOPIC
Bilirubin Urine: NEGATIVE
Glucose, UA: NEGATIVE mg/dL
Hgb urine dipstick: NEGATIVE
Ketones, ur: NEGATIVE mg/dL
Nitrite: POSITIVE — AB
Protein, ur: NEGATIVE mg/dL
Specific Gravity, Urine: 1.015 (ref 1.005–1.030)
pH: 5 (ref 5.0–8.0)

## 2022-03-06 LAB — CBC WITH DIFFERENTIAL/PLATELET
Abs Immature Granulocytes: 0.04 10*3/uL (ref 0.00–0.07)
Basophils Absolute: 0.1 10*3/uL (ref 0.0–0.1)
Basophils Relative: 1 %
Eosinophils Absolute: 0.1 10*3/uL (ref 0.0–0.5)
Eosinophils Relative: 1 %
HCT: 25.4 % — ABNORMAL LOW (ref 36.0–46.0)
Hemoglobin: 8.3 g/dL — ABNORMAL LOW (ref 12.0–15.0)
Immature Granulocytes: 0 %
Lymphocytes Relative: 12 %
Lymphs Abs: 1.2 10*3/uL (ref 0.7–4.0)
MCH: 34 pg (ref 26.0–34.0)
MCHC: 32.7 g/dL (ref 30.0–36.0)
MCV: 104.1 fL — ABNORMAL HIGH (ref 80.0–100.0)
Monocytes Absolute: 1.2 10*3/uL — ABNORMAL HIGH (ref 0.1–1.0)
Monocytes Relative: 13 %
Neutro Abs: 6.9 10*3/uL (ref 1.7–7.7)
Neutrophils Relative %: 73 %
Platelets: 193 10*3/uL (ref 150–400)
RBC: 2.44 MIL/uL — ABNORMAL LOW (ref 3.87–5.11)
RDW: 15.4 % (ref 11.5–15.5)
WBC: 9.4 10*3/uL (ref 4.0–10.5)
nRBC: 0 % (ref 0.0–0.2)

## 2022-03-06 LAB — COMPREHENSIVE METABOLIC PANEL
ALT: 17 U/L (ref 0–44)
AST: 18 U/L (ref 15–41)
Albumin: 3.8 g/dL (ref 3.5–5.0)
Alkaline Phosphatase: 76 U/L (ref 38–126)
Anion gap: 7 (ref 5–15)
BUN: 23 mg/dL (ref 8–23)
CO2: 27 mmol/L (ref 22–32)
Calcium: 8.8 mg/dL — ABNORMAL LOW (ref 8.9–10.3)
Chloride: 102 mmol/L (ref 98–111)
Creatinine, Ser: 0.78 mg/dL (ref 0.44–1.00)
GFR, Estimated: >60 mL/min (ref >60)
Glucose, Bld: 119 mg/dL — ABNORMAL HIGH (ref 70–99)
Potassium: 4.3 mmol/L (ref 3.5–5.1)
Sodium: 136 mmol/L (ref 135–145)
Total Bilirubin: 0.6 mg/dL (ref 0.3–1.2)
Total Protein: 6.6 g/dL (ref 6.5–8.1)

## 2022-03-06 LAB — POC OCCULT BLOOD, ED: Fecal Occult Bld: POSITIVE — AB

## 2022-03-06 LAB — HEMOGLOBIN AND HEMATOCRIT, BLOOD
HCT: 25.7 % — ABNORMAL LOW (ref 36.0–46.0)
Hemoglobin: 8.2 g/dL — ABNORMAL LOW (ref 12.0–15.0)

## 2022-03-06 LAB — PROTIME-INR
INR: 1 (ref 0.8–1.2)
Prothrombin Time: 13.5 seconds (ref 11.4–15.2)

## 2022-03-06 MED ORDER — PANTOPRAZOLE SODIUM 40 MG PO TBEC
80.0000 mg | DELAYED_RELEASE_TABLET | Freq: Every morning | ORAL | Status: DC
  Administered 2022-03-07 – 2022-03-10 (×4): 80 mg via ORAL
  Filled 2022-03-06 (×4): qty 2

## 2022-03-06 MED ORDER — LEVOTHYROXINE SODIUM 100 MCG PO TABS
100.0000 ug | ORAL_TABLET | Freq: Every day | ORAL | Status: DC
  Administered 2022-03-07 – 2022-03-10 (×5): 100 ug via ORAL
  Filled 2022-03-06 (×5): qty 1

## 2022-03-06 MED ORDER — SODIUM CHLORIDE 0.9 % IV BOLUS
500.0000 mL | Freq: Once | INTRAVENOUS | Status: AC
  Administered 2022-03-06: 500 mL via INTRAVENOUS

## 2022-03-06 MED ORDER — IRBESARTAN 75 MG PO TABS
75.0000 mg | ORAL_TABLET | Freq: Every day | ORAL | Status: DC
  Administered 2022-03-06 – 2022-03-07 (×2): 75 mg via ORAL
  Filled 2022-03-06 (×2): qty 1

## 2022-03-06 MED ORDER — MIRTAZAPINE 15 MG PO TABS
15.0000 mg | ORAL_TABLET | Freq: Every day | ORAL | Status: DC
  Administered 2022-03-06 – 2022-03-09 (×4): 15 mg via ORAL
  Filled 2022-03-06 (×4): qty 1

## 2022-03-06 MED ORDER — IOHEXOL 300 MG/ML  SOLN
100.0000 mL | Freq: Once | INTRAMUSCULAR | Status: AC | PRN
  Administered 2022-03-06: 100 mL via INTRAVENOUS

## 2022-03-06 MED ORDER — METOPROLOL SUCCINATE ER 50 MG PO TB24
50.0000 mg | ORAL_TABLET | Freq: Every day | ORAL | Status: DC
  Administered 2022-03-07 – 2022-03-10 (×4): 50 mg via ORAL
  Filled 2022-03-06 (×4): qty 1

## 2022-03-06 MED ORDER — DIGOXIN 125 MCG PO TABS
0.1250 mg | ORAL_TABLET | Freq: Every day | ORAL | Status: DC
  Administered 2022-03-07 – 2022-03-10 (×4): 0.125 mg via ORAL
  Filled 2022-03-06 (×4): qty 1

## 2022-03-06 MED ORDER — ATORVASTATIN CALCIUM 20 MG PO TABS
20.0000 mg | ORAL_TABLET | Freq: Every day | ORAL | Status: DC
  Administered 2022-03-06 – 2022-03-09 (×4): 20 mg via ORAL
  Filled 2022-03-06 (×4): qty 1

## 2022-03-06 MED ORDER — METOPROLOL TARTRATE 5 MG/5ML IV SOLN
5.0000 mg | Freq: Four times a day (QID) | INTRAVENOUS | Status: DC | PRN
  Administered 2022-03-06: 5 mg via INTRAVENOUS
  Filled 2022-03-06: qty 5

## 2022-03-06 MED ORDER — SODIUM CHLORIDE 1 G PO TABS
1.0000 g | ORAL_TABLET | Freq: Every day | ORAL | Status: DC
  Administered 2022-03-07 – 2022-03-08 (×2): 1 g via ORAL
  Filled 2022-03-06 (×2): qty 1

## 2022-03-06 MED ORDER — VITAMIN D 25 MCG (1000 UNIT) PO TABS
2000.0000 [IU] | ORAL_TABLET | Freq: Every day | ORAL | Status: DC
  Administered 2022-03-07 – 2022-03-10 (×4): 2000 [IU] via ORAL
  Filled 2022-03-06 (×4): qty 2

## 2022-03-06 MED ORDER — ADULT MULTIVITAMIN W/MINERALS CH
1.0000 | ORAL_TABLET | Freq: Every day | ORAL | Status: DC
  Administered 2022-03-07 – 2022-03-10 (×4): 1 via ORAL
  Filled 2022-03-06 (×4): qty 1

## 2022-03-06 NOTE — ED Triage Notes (Signed)
Pt BIB [facility] from [facility] for rectal bleeding. Pt has a hx on colon CA, vascular dementia

## 2022-03-06 NOTE — H&P (Signed)
History and Physical    Patient: Kathy Velez IWL:[id number] DOB: [birth year] DOA: 03/06/2022 DOS: the patient was seen and examined on 03/06/2022 PCP: Virgie Dad, MD  Patient coming from: SNF  Chief Complaint:  Chief Complaint  Patient presents with   Rectal Bleeding   HPI: Kathy Velez is a 88 y.o. female with medical history significant of colon CA, hypothyroidism, HTN, A fib, GERD, HLD, RA, chronic hyponatremia. Presenting with rectal bleeding. She reports that she noticed BRBPR and clots starting 4 days ago. She did not have any pain. She did not have any dyspnea, palpitations, or lightheadedness. She has not been using NSAIDs. Her facility thought she may be having a hemorrhoid problem, so they gave her a steroid suppository. This seemed to help initially; however, the bleeding returned yesterday. After checking a hemoglobin at the facility and seeing it down trending; it was recommended that she come to the ED today.   Review of Systems: As mentioned in the history of present illness. All other systems reviewed and are negative. Past Medical History:  Diagnosis Date   Allergy    seasonal   Anemia    Anxiety    Arthritis    Back   Asymptomatic menopausal state    Atrial fibrillation ([place])    Cancer ([place]) 2006   Colon.  Basal Cell Skin cancer- right arm   Cataract    removed bilateral   Chronic atrial fibrillation (HCC)    Colon cancer (HCC)    Constipation due to pain medication therapy    after heart surgery   Deficiency of other specified B group vitamins    Diplopia    Disorientation, unspecified    Dysrhythmia    PAF   GERD (gastroesophageal reflux disease)    Heart murmur    Hyperlipidemia    Hypertension    Hypothyroidism    Malignant neoplasm of colon, unspecified ([place])    Other specified diseases of blood and blood-forming organs    Other specified disorders of bone density and structure, unspecified forearm    Other thrombophilia ([place])    per matrix    Overactive bladder    per Matrix   Personal history of other malignant neoplasm of large intestine    Presbycusis, bilateral    per matrix   RA (rheumatoid arthritis) ([place])    Restless leg    Seizures ([place])    after Heart Surgey   Stroke ([place])    TIA- found by neurologist after    Valgus deformity, not elsewhere classified, right knee    per matrix   Vascular dementia, mild, without behavioral disturbance, psychotic disturbance, mood disturbance, and anxiety ([place])    per matrix   Past Surgical History:  Procedure Laterality Date   ABDOMINAL HYSTERECTOMY  1970   Partial    COLON RESECTION  2006   cancer   COLON SURGERY     COLONOSCOPY     EYE SURGERY Bilateral    Cataract   MAXIMUM ACCESS (MAS)POSTERIOR LUMBAR INTERBODY FUSION (PLIF) 2 LEVEL N/A 04/12/2015   Procedure: Lumbar Three-Five Decompression, Pedicle Screw Fixation, and Posteriolateral Arthrodesis;  Surgeon: Erline Levine, MD;  Location: [facility];  Service: Neurosurgery;  Laterality: N/A;  L3-4 L4-5 Maximum access posterior lumbar fusion, possible interbodies and resection of synovial cyst at L4-5   MITRAL VALVE REPAIR  01/20/2013   Gore-tex cords to P1, P2, and P3. Magic suture to posterior medial commisure, #30 Physio 1 ring. Done in [place]   TONSILLECTOMY  about [place]  01/20/2013   #28 TriAd ring done in [place]   Social History:  reports that she has never smoked. She has been exposed to tobacco smoke. She has never used smokeless tobacco. She reports current alcohol use of about 1.0 standard drink of alcohol per week. She reports that she does not use drugs.  Allergies  Allergen Reactions   Claritin [Loratadine] Other (See Comments)    Listed on MAR Unknown reaction   Codeine Other (See Comments)    "just don't take it well"   Gabapentin Other (See Comments)    Dizziness    Molds & Smuts Other (See Comments)    Dust.  Reaction is not listed on MAR    Pollen Extract  Swelling    Grass   Bee Venom Swelling and Rash   Wasp Venom Swelling and Rash    Family History  Problem Relation Age of Onset   Lung disease Mother 27   Heart disease Father 73   Colon polyps Daughter    Alcohol abuse Son    Colon cancer Neg Hx    Stomach cancer Neg Hx    Rectal cancer Neg Hx    Crohn's disease Neg Hx    Esophageal cancer Neg Hx     Prior to Admission medications   Medication Sig Start Date End Date Taking? Authorizing Provider  acetaminophen (TYLENOL) 500 MG tablet Take 1,000 mg by mouth 2 (two) times daily as needed for mild pain.   Yes [provider]  atorvastatin (LIPITOR) 20 MG tablet TAKE 1 TABLET DAILY Patient taking differently: Take 20 mg by mouth at bedtime. 06/14/21  Yes Virgie Dad, MD  Cholecalciferol (VITAMIN D) 50 MCG (2000 UT) CAPS Take 2,000 Units by mouth in the morning.   Yes [provider]  cloNIDine (CATAPRES) 0.1 MG tablet Take 1 tablet (0.1 mg total) by mouth daily as needed. If SBP >180 only Patient taking differently: Take 0.1 mg by mouth as needed (if SBP > 180). 02/08/22  Yes Wert, Margreta Journey, NP  digoxin (LANOXIN) 0.125 MG tablet TAKE 1 TABLET DAILY Patient taking differently: Take 0.125 mg by mouth daily. 06/14/21  Yes Virgie Dad, MD  Iron, Ferrous Sulfate, 325 (65 Fe) MG TABS Take 325 mg by mouth daily. 03/03/22  Yes Wert, Margreta Journey, NP  leflunomide (ARAVA) 20 MG tablet Take 20 mg by mouth in the morning. 10/05/19  Yes [provider]  levalbuterol (XOPENEX) 0.63 MG/3ML nebulizer solution Take 3 mLs (0.63 mg total) by nebulization in the morning and at bedtime. Patient taking differently: Take 0.63 mg by nebulization every 6 (six) hours as needed for wheezing (cough). 02/08/22  Yes Royal Hawthorn, NP  levothyroxine (SYNTHROID) 100 MCG tablet Take 1 tablet (100 mcg total) by mouth daily before breakfast. 01/04/22  Yes Ladell Pier, MD  metoprolol succinate (TOPROL-XL) 50 MG 24 hr tablet TAKE 1  TABLET DAILY Patient taking differently: Take 50 mg by mouth daily. 06/14/21  Yes Virgie Dad, MD  mirtazapine (REMERON) 15 MG tablet TAKE 1 TABLET AT BEDTIME Patient taking differently: Take 15 mg by mouth at bedtime. 02/20/21  Yes Virgie Dad, MD  Multiple Vitamin (MULTIVITAMIN) tablet Take 1 tablet by mouth in the morning.   Yes [provider]  Multiple Vitamins-Minerals (PRESERVISION AREDS 2) CAPS Take 1 capsule by mouth 2 (two) times daily.   Yes [provider]  NON FORMULARY Fluid restriction 1800cc daily  1st-  900 cc 2nd- 700 cc 3rd- 200 cc   Yes [provider]  omeprazole (PRILOSEC) 40 MG capsule TAKE 1 CAPSULE DAILY Patient taking differently: Take 40 mg by mouth daily. Take 30 minutes before breakfast on a empty stomach 10/17/21  Yes Virgie Dad, MD  ondansetron (ZOFRAN) 4 MG tablet Take 4 mg by mouth every 6 (six) hours as needed for nausea or vomiting.   Yes [provider]  PRESCRIPTION MEDICATION Take 1 g by mouth daily. Sodium 1 g   Yes [provider]  shark liver oil-cocoa butter (PREPARATION H) 0.25-3-85.5 % suppository Place 1 suppository rectally at bedtime. For 7 days starting 03/02/22   Yes [provider]  sodium chloride 1 g tablet Take 1 g by mouth in the morning.   Yes [provider]  valsartan (DIOVAN) 80 MG tablet Take 60 mg by mouth at bedtime. Increase valsartan to 60 mg by mouth daily and report if BP is not consistently <150/90 per [facility]   Yes [provider]  Vibegron (GEMTESA) 75 MG TABS Take 32.5 mg by mouth at bedtime. 11/10/21  Yes Wert, Margreta Journey, NP  vitamin B-12 (CYANOCOBALAMIN) 1000 MCG tablet Take 1,000 mcg by mouth in the morning.   Yes [provider]  hydrocortisone (ANUSOL-HC) 25 MG suppository Place 1 suppository (25 mg total) rectally daily. Patient not taking: Reported on 03/06/2022 03/03/22   Royal Hawthorn, NP  valsartan (DIOVAN) 160 MG tablet Take 0.5  tablets (80 mg total) by mouth daily. Patient not taking: Reported on 03/06/2022 02/23/22   Royal Hawthorn, NP    Physical Exam: Vitals:   03/06/22 1254 03/06/22 1405 03/06/22 1445 03/06/22 1500  BP: 126/61 139/75  (!) 141/86  Pulse: 92 (!) 58  94  Resp: 18 20  (!) 22  Temp: 97.9 F (36.6 C)     TempSrc: Oral     SpO2: 98% 100%  100%  Weight:   55.5 kg   Height:   '5\' 2"'$  (1.575 m)    General: 88 y.o. female resting in bed in NAD Eyes: PERRL, normal sclera ENMT: Nares patent w/o discharge, orophaynx clear, dentition normal, ears w/o discharge/lesions/ulcers Neck: Supple, trachea midline Cardiovascular: RRR, +S1, S2, no m/g/r, equal pulses throughout Respiratory: CTABL, no w/r/r, normal WOB GI: BS+, protuberant, soft, NT, no masses noted, no organomegaly noted MSK: No e/c/c; RLE in cast Neuro: A&O x 3, no focal deficits Psyc: Appropriate interaction and affect, calm/cooperative  Data Reviewed:  Results for orders placed or performed during the hospital encounter of 03/06/22 (from the past 24 hour(s))  Comprehensive metabolic panel     Status: Abnormal   Collection Time: 03/06/22  1:06 PM  Result Value Ref Range   Sodium 136 135 - 145 mmol/L   Potassium 4.3 3.5 - 5.1 mmol/L   Chloride 102 98 - 111 mmol/L   CO2 27 22 - 32 mmol/L   Glucose, Bld 119 (H) 70 - 99 mg/dL   BUN 23 8 - 23 mg/dL   Creatinine, Ser 0.78 0.44 - 1.00 mg/dL   Calcium 8.8 (L) 8.9 - 10.3 mg/dL   Total Protein 6.6 6.5 - 8.1 g/dL   Albumin 3.8 3.5 - 5.0 g/dL   AST 18 15 - 41 U/L   ALT 17 0 - 44 U/L   Alkaline Phosphatase 76 38 - 126 U/L   Total Bilirubin 0.6 0.3 - 1.2 mg/dL   GFR, Estimated >60 >60 mL/min   Anion gap 7 5 - 15  CBC with Differential     Status: Abnormal   Collection Time: 03/06/22  1:06 PM  Result Value Ref Range   WBC 9.4 4.0 - 10.5 K/uL   RBC 2.44 (L) 3.87 - 5.11 MIL/uL   Hemoglobin 8.3 (L) 12.0 - 15.0 g/dL   HCT 25.4 (L) 36.0 - 46.0 %   MCV 104.1 (H) 80.0 - 100.0 fL   MCH 34.0  26.0 - 34.0 pg   MCHC 32.7 30.0 - 36.0 g/dL   RDW 15.4 11.5 - 15.5 %   Platelets 193 150 - 400 K/uL   nRBC 0.0 0.0 - 0.2 %   Neutrophils Relative % 73 %   Neutro Abs 6.9 1.7 - 7.7 K/uL   Lymphocytes Relative 12 %   Lymphs Abs 1.2 0.7 - 4.0 K/uL   Monocytes Relative 13 %   Monocytes Absolute 1.2 (H) 0.1 - 1.0 K/uL   Eosinophils Relative 1 %   Eosinophils Absolute 0.1 0.0 - 0.5 K/uL   Basophils Relative 1 %   Basophils Absolute 0.1 0.0 - 0.1 K/uL   Immature Granulocytes 0 %   Abs Immature Granulocytes 0.04 0.00 - 0.07 K/uL  Protime-INR     Status: None   Collection Time: 03/06/22  1:06 PM  Result Value Ref Range   Prothrombin Time 13.5 11.4 - 15.2 seconds   INR 1.0 0.8 - 1.2  Type and screen [place]     Status: None   Collection Time: 03/06/22  1:06 PM  Result Value Ref Range   ABO/RH(D) O NEG    Antibody Screen NEG    Sample Expiration      03/09/2022,2359 Performed at [facility], [place] [address] [place], [place] [address]   POC occult blood, ED     Status: Abnormal   Collection Time: 03/06/22  1:49 PM  Result Value Ref Range   Fecal Occult Bld POSITIVE (A) NEGATIVE   Assessment and Plan: GIB     - place obs, tele     - check q6H H&H, transfuse pRBCs for Hgb < 7     - [facility] onboard, appreciate assistance; final recs per them      Hx of Colon CA      - Colon CA was identified on c-scope several years ago; it had a tumor marker that suggested susceptibility to immunotherapy; so this was started with Dr. Benay Spice. She has been off that therapy since her last c-scope (this past summer) that showed her colon was clean     - CT ab/pelvis ordered by [facility]; will follow; if evidence of new mass/mets, consult onco  Hypothyroidism     - continue home regimen when confirmed  HTN     - continue home regimen when confirmed  A fib     - continue home regimen when confirmed     - hold any anticoag/antiplatelets  GERD     - PPI  HLD      - continue home regimen when confirmed  RA     - continue home regimen when confirmed     - hold any NSAIDs, steroids  Advance Care Planning:   Code Status: DNR (yellow sheet)  Consults: [facility]  Family Communication: w/ family at bedside  Severity of Illness: The appropriate patient status for this patient is OBSERVATION. Observation status is judged to be reasonable and necessary in order to provide the required intensity of service to ensure the patient's safety. The patient's presenting symptoms,  physical exam findings, and initial radiographic and laboratory data in the context of their medical condition is felt to place them at decreased risk for further clinical deterioration. Furthermore, it is anticipated that the patient will be medically stable for discharge from the hospital within 2 midnights of admission.   Author: Jonnie Finner, DO 03/06/2022 3:42 PM  For on call review [email or web address].

## 2022-03-06 NOTE — ED Provider Notes (Signed)
Emergency Department Provider Note   I have reviewed the triage vital signs and the nursing notes.   HISTORY  Chief Complaint Rectal Bleeding   HPI Kathy Velez is a 88 y.o. female with past medical history reviewed below including A-fib, no longer on anticoagulation due to scalp bleeding after a fall, presents emergency department with rectal bleeding over the past several days.  She had bright red blood per rectum starting 4 days prior.  She apparently had an outpatient colonoscopy with [facility] earlier this year following up on a prior area of concern/tumor.  At that time, she was found to have asymptomatic internal hemorrhoids.  When the bleeding started 4 days ago she was prescribed steroid suppository which seem to be improving symptoms but bleeding returned yesterday and this morning.  Staff at the facility have been following her CBC and noted it to be downtrending which ultimately prompted her to seek ED care today.  Patient denies any abdominal pain, weakness, fever.  The patient does have a history of cognitive impairment with memory loss and so most of the history comes from family at bedside.  Family tells me that the patient was taken off of her anticoagulation in the setting of recent fall with persistent bleeding from a scalp wound.  She had been on aspirin until Friday when the GI bleeding started, at which time aspirin was also discontinued.   Past Medical History:  Diagnosis Date   Allergy    seasonal   Anemia    Anxiety    Arthritis    Back   Asymptomatic menopausal state    Atrial fibrillation ([place])    Cancer ([place]) 2006   Colon.  Basal Cell Skin cancer- right arm   Cataract    removed bilateral   Chronic atrial fibrillation (HCC)    Colon cancer (HCC)    Constipation due to pain medication therapy    after heart surgery   Deficiency of other specified B group vitamins    Diplopia    Disorientation, unspecified    Dysrhythmia    PAF   GERD  (gastroesophageal reflux disease)    Heart murmur    Hyperlipidemia    Hypertension    Hypothyroidism    Malignant neoplasm of colon, unspecified ([place])    Other specified diseases of blood and blood-forming organs    Other specified disorders of bone density and structure, unspecified forearm    Other thrombophilia ([place])    per matrix   Overactive bladder    per Matrix   Personal history of other malignant neoplasm of large intestine    Presbycusis, bilateral    per matrix   RA (rheumatoid arthritis) ([place])    Restless leg    Seizures ([place])    after Heart Surgey   Stroke ([place])    TIA- found by neurologist after    Valgus deformity, not elsewhere classified, right knee    per matrix   Vascular dementia, mild, without behavioral disturbance, psychotic disturbance, mood disturbance, and anxiety ([place])    per matrix    Review of Systems  Constitutional: No fever/chills Eyes: No visual changes. ENT: No sore throat. Cardiovascular: Denies chest pain. Respiratory: Denies shortness of breath. Gastrointestinal: No abdominal pain.  No nausea, no vomiting.  No diarrhea.  No constipation. Genitourinary: Negative for dysuria. Musculoskeletal: Negative for back pain. Skin: Negative for rash. Neurological: Negative for headaches, focal weakness or numbness.  ____________________________________________   PHYSICAL EXAM:  VITAL SIGNS: Vitals:  03/06/22 1254 03/06/22 1405  BP: 126/61 139/75  Pulse: 92 (!) 58  Resp: 18 20  Temp: 97.9 F (36.6 C)   SpO2: 98% 100%    Constitutional: Alert. Well appearing and in no acute distress. Eyes: Conjunctivae are normal.  Head: Atraumatic. Nose: No congestion/rhinnorhea. Mouth/Throat: Mucous membranes are moist.  Neck: No stridor.   Cardiovascular: Normal rate, regular rhythm. Good peripheral circulation. Grossly normal heart sounds.   Respiratory: Normal respiratory effort.  No retractions. Lungs CTAB. Gastrointestinal: Soft and  nontender. No distention. Exam performed with patient's verbal consent and nurse chaperone. Non-thrombosed external hemorrhoid visualized without active bleeding. No BRB or melena on my exam. No fissures.  Musculoskeletal: No lower extremity tenderness nor edema. No gross deformities of extremities. Cast in place on the right ankle consistent with known fracture.  Neurologic:  Normal speech and language. No gross focal neurologic deficits are appreciated.  Skin:  Skin is warm, dry and intact. No rash noted.  ____________________________________________   LABS (all labs ordered are listed, but only abnormal results are displayed)  Labs Reviewed  COMPREHENSIVE METABOLIC PANEL - Abnormal; Notable for the following components:      Result Value   Glucose, Bld 119 (*)    Calcium 8.8 (*)    All other components within normal limits  CBC WITH DIFFERENTIAL/PLATELET - Abnormal; Notable for the following components:   RBC 2.44 (*)    Hemoglobin 8.3 (*)    HCT 25.4 (*)    MCV 104.1 (*)    Monocytes Absolute 1.2 (*)    All other components within normal limits  POC OCCULT BLOOD, ED - Abnormal; Notable for the following components:   Fecal Occult Bld POSITIVE (*)    All other components within normal limits  PROTIME-INR  URINALYSIS, ROUTINE W REFLEX MICROSCOPIC  TYPE AND SCREEN   ____________________________________________  EKG   EKG Interpretation  Date/Time:  Tuesday March 06 2022 13:56:09 EST Ventricular Rate:  80 PR Interval:    QRS Duration: 88 QT Interval:  358 QTC Calculation: 413 R Axis:   26 Text Interpretation: Atrial fibrillation Abnormal R-wave progression, early transition Anteroseptal infarct, age indeterminate Confirmed by Nanda Quinton [phone]) on 03/06/2022 2:54:09 PM        ____________________________________________   PROCEDURES  Procedure(s) performed:   Procedures  None  ____________________________________________   INITIAL IMPRESSION /  ASSESSMENT AND PLAN / ED COURSE  Pertinent labs & imaging results that were available during my care of the patient were reviewed by me and considered in my medical decision making (see chart for details).   This patient is Presenting for Evaluation of GI bleeding, which does require a range of treatment options, and is a complaint that involves a high risk of morbidity and mortality.  The Differential Diagnoses includes upper GI bleeding, diverticular bleeding, colitis, internal hemorrhoids, etc.  Critical Interventions-    Medications  sodium chloride 0.9 % bolus 500 mL (500 mLs Intravenous New Bag/Given 03/06/22 1359)    Reassessment after intervention: Remains HDS.    I did obtain Additional Historical Information from family at bedside.   I decided to review pertinent External Data, and in summary patient had a colonoscopy in June with Dr. Fuller Plan describing grade 1 internal hemorrhoids, nonbleeding diverticula, and prior ileal-colonic anastomosis which was well appearing.    Clinical Laboratory Tests Ordered, included hemoglobin of 8.3 downtrending from around 10 in mid November.  Fecal occult positive but no bright red blood or melena on exam.  No acute  kidney injury.  Radiologic Tests: Considered CT abdomen pelvis but with no evidence of active bleeding on my exam we will hold on CTA at this time.   Cardiac Monitor Tracing which shows NSR.    Social Determinants of Health Risk patient is a non-smoker.   Consult complete with [facility], Dr. Havery Moros. They will consult while inpatient.   Discussed patient's case with [facility], Dr. Marylyn Ishihara to request admission. Patient and family (if present) updated with plan.  I reviewed all nursing notes, vitals, pertinent old records, EKGs, labs, imaging (as available).   Medical Decision Making: Summary:  Patient presents to the emergency department with bright red blood per rectum for the past 4 days.  She is no longer  anticoagulated recently discontinued aspirin in the setting of GI bleeding.  Hemodynamically she is stable.  Reevaluation with update and discussion with patient and family.  With downtrending hemoglobin and continued bleeding despite attempted management at the facility, plan for admit for hemoglobin trending and GI consultation.  Patient and family at bedside are in agreement.  They would be okay with PRBC transfusion if required.  Patient's presentation is most consistent with acute presentation with potential threat to life or bodily function.   Disposition: admit  ____________________________________________  FINAL CLINICAL IMPRESSION(S) / ED DIAGNOSES  Final diagnoses:  Rectal bleeding    Note:  This document was prepared using Dragon voice recognition software and may include unintentional dictation errors.  Nanda Quinton, MD, [facility] Emergency Medicine    Corin Formisano, [facility], MD 03/06/22 [phone]

## 2022-03-06 NOTE — Consult Note (Signed)
Consultation  Referring Provider: ER MD [facility]/ [facility]  Primary Care Physician:  Virgie Dad, MD Primary Gastroenterologist:  Dr.Stark  Reason for Consultation: Lower GI bleed  HPI: Kathy Velez is a 88 y.o. female, known to Dr. Fuller Plan who has remote history of colon cancer with segmental resection in 2006.  She had been seen in our office about a year ago in November 2022 for iron deficiency anemia, Hemoccult positive stool, and underwent EGD and colonoscopy on 02/22/2021.  At EGD she had a benign distal esophageal stricture and a small hiatal hernia. Colonoscopy evidence of prior end to end ileocolonic anastomosis in the transverse colon, there was a 25 mm polyp in the transverse colon removed piecemeal and then a fungating partially obstructing mass at the splenic flexure approximately 2 cm in length.  Biopsies revealed invasive adenocarcinoma, well-differentiated and the polyp was a sessile serrated polyp. Imaging and surgical consultation were recommended.  Family decided not to pursue surgery due to her advanced age.  She has been followed since by Dr. Benay Spice  Oncology  and eventual decision was made to treat her with immunotherapy/pembrolizumab which was started in March 2023.  Initial CT scan showed no evidence of metastatic disease. She underwent follow-up colonoscopy June 2023 per Dr. Fuller Plan for reassessment, the post polypectomy scar was found at the tattoo site, tattoo was seen in the descending colon, evidence of prior end-to-side ileocolonic anastomosis in the transverse colon patent, and multiple medium sized diverticuli in the left colon as well as small internal hemorrhoids otherwise normal exam. She stopped immunotherapy as of July 2023 as she was not tolerating well.  Labs have been monitored since and she has been anemic but had not had any evidence of overt bleeding.  Globin had been in the 8-10 range..  She had previously been on Eliquis, however after she sustained a fal  September 2023 and sustained a hematoma to her scalp decision was made to stop Eliquis which she had been on for atrial fibrillation. Her daughter says that over the past weeks they have noticed an increase in fatigue, and decrease in appetite.  Over this past week she has developed some confusion which is new.  Patient is unaware of seeing any blood in her stools until reported episode on Friday at which point she passed a dark bloody stool.  She is nonambulatory at this point, wears depends.  She was noted to have dark blood in her stool on Saturday on a couple of occasions, no obvious blood on Sunday and then yesterday and into this morning had several episodes of dark blood and clots. Patient has no complaints of abdominal pain, no nausea or vomiting, no fever or chills.  She admits to feeling "puny". Labs on 03/02/2022 with hemoglobin 9.9/hematocrit 28 Today in ER hemoglobin 8.3/hematocrit 25.4/WBC 9.4 MCV 104 INR 1.0/pro time 13.5 BUN 23/creatinine 0.78 Sodium 136/potassium 4.3 LFTs within normal limits.     Past Medical History:  Diagnosis Date   Allergy    seasonal   Anemia    Anxiety    Arthritis    Back   Asymptomatic menopausal state    Atrial fibrillation ([place])    Cancer ([place]) 2006   Colon.  Basal Cell Skin cancer- right arm   Cataract    removed bilateral   Chronic atrial fibrillation (H[facility])    Colon cancer (H[facility])    Constipation due to pain medication therapy    after heart surgery   Deficiency of other specified  B group vitamins    Diplopia    Disorientation, unspecified    Dysrhythmia    PAF   GERD (gastroesophageal reflux disease)    Heart murmur    Hyperlipidemia    Hypertension    Hypothyroidism    Malignant neoplasm of colon, unspecified ([place])    Other specified diseases of blood and blood-forming organs    Other specified disorders of bone density and structure, unspecified forearm    Other thrombophilia ([place])    per matrix   Overactive bladder    per  Matrix   Personal history of other malignant neoplasm of large intestine    Presbycusis, bilateral    per matrix   RA (rheumatoid arthritis) ([place])    Restless leg    Seizures ([place])    after Heart Surgey   Stroke ([place])    TIA- found by neurologist after    Valgus deformity, not elsewhere classified, right knee    per matrix   Vascular dementia, mild, without behavioral disturbance, psychotic disturbance, mood disturbance, and anxiety ([place])    per matrix    Past Surgical History:  Procedure Laterality Date   ABDOMINAL HYSTERECTOMY  1970   Partial    COLON RESECTION  2006   cancer   COLON SURGERY     COLONOSCOPY     EYE SURGERY Bilateral    Cataract   MAXIMUM ACCESS (MAS)POSTERIOR LUMBAR INTERBODY FUSION (PLIF) 2 LEVEL N/A 04/12/2015   Procedure: Lumbar Three-Five Decompression, Pedicle Screw Fixation, and Posteriolateral Arthrodesis;  Surgeon: Erline Levine, MD;  Location: [facility];  Service: Neurosurgery;  Laterality: N/A;  L3-4 L4-5 Maximum access posterior lumbar fusion, possible interbodies and resection of synovial cyst at L4-5   MITRAL VALVE REPAIR  01/20/2013   Gore-tex cords to P1, P2, and P3. Magic suture to posterior medial commisure, #30 Physio 1 ring. Done in [place]   TONSILLECTOMY     about [place]  01/20/2013   #28 TriAd ring done in [place]    Prior to Admission medications   Medication Sig Start Date End Date Taking? Authorizing Provider  acetaminophen (TYLENOL) 500 MG tablet Take 1,000 mg by mouth 2 (two) times daily as needed for mild pain.   Yes [provider]  atorvastatin (LIPITOR) 20 MG tablet TAKE 1 TABLET DAILY Patient taking differently: Take 20 mg by mouth at bedtime. 06/14/21  Yes Virgie Dad, MD  Cholecalciferol (VITAMIN D) 50 MCG (2000 UT) CAPS Take 2,000 Units by mouth in the morning.   Yes [provider]  cloNIDine (CATAPRES) 0.1 MG tablet Take 1 tablet (0.1 mg total) by mouth daily as needed. If  SBP >180 only Patient taking differently: Take 0.1 mg by mouth as needed (if SBP > 180). 02/08/22  Yes Wert, Margreta Journey, NP  digoxin (LANOXIN) 0.125 MG tablet TAKE 1 TABLET DAILY Patient taking differently: Take 0.125 mg by mouth daily. 06/14/21  Yes Virgie Dad, MD  Iron, Ferrous Sulfate, 325 (65 Fe) MG TABS Take 325 mg by mouth daily. 03/03/22  Yes Wert, Margreta Journey, NP  leflunomide (ARAVA) 20 MG tablet Take 20 mg by mouth in the morning. 10/05/19  Yes [provider]  levalbuterol (XOPENEX) 0.63 MG/3ML nebulizer solution Take 3 mLs (0.63 mg total) by nebulization in the morning and at bedtime. Patient taking differently: Take 0.63 mg by nebulization every 6 (six) hours as needed for wheezing (cough). 02/08/22  Yes Royal Hawthorn, NP  levothyroxine (SYNTHROID) 100 MCG tablet  Take 1 tablet (100 mcg total) by mouth daily before breakfast. 01/04/22  Yes Ladell Pier, MD  metoprolol succinate (TOPROL-XL) 50 MG 24 hr tablet TAKE 1 TABLET DAILY Patient taking differently: Take 50 mg by mouth daily. 06/14/21  Yes Virgie Dad, MD  mirtazapine (REMERON) 15 MG tablet TAKE 1 TABLET AT BEDTIME Patient taking differently: Take 15 mg by mouth at bedtime. 02/20/21  Yes Virgie Dad, MD  Multiple Vitamin (MULTIVITAMIN) tablet Take 1 tablet by mouth in the morning.   Yes [provider]  Multiple Vitamins-Minerals (PRESERVISION AREDS 2) CAPS Take 1 capsule by mouth 2 (two) times daily.   Yes [provider]  NON FORMULARY Fluid restriction 1800cc daily  1st- 900 cc 2nd- 700 cc 3rd- 200 cc   Yes [provider]  omeprazole (PRILOSEC) 40 MG capsule TAKE 1 CAPSULE DAILY Patient taking differently: Take 40 mg by mouth daily. Take 30 minutes before breakfast on a empty stomach 10/17/21  Yes Virgie Dad, MD  ondansetron (ZOFRAN) 4 MG tablet Take 4 mg by mouth every 6 (six) hours as needed for nausea or vomiting.   Yes [provider]  PRESCRIPTION MEDICATION  Take 1 g by mouth daily. Sodium 1 g   Yes [provider]  shark liver oil-cocoa butter (PREPARATION H) 0.25-3-85.5 % suppository Place 1 suppository rectally at bedtime. For 7 days starting 03/02/22   Yes [provider]  sodium chloride 1 g tablet Take 1 g by mouth in the morning.   Yes [provider]  valsartan (DIOVAN) 80 MG tablet Take 60 mg by mouth at bedtime. Increase valsartan to 60 mg by mouth daily and report if BP is not consistently <150/90 per [facility]   Yes [provider]  Vibegron (GEMTESA) 75 MG TABS Take 32.5 mg by mouth at bedtime. 11/10/21  Yes Wert, Margreta Journey, NP  vitamin B-12 (CYANOCOBALAMIN) 1000 MCG tablet Take 1,000 mcg by mouth in the morning.   Yes [provider]  hydrocortisone (ANUSOL-HC) 25 MG suppository Place 1 suppository (25 mg total) rectally daily. Patient not taking: Reported on 03/06/2022 03/03/22   Royal Hawthorn, NP  valsartan (DIOVAN) 160 MG tablet Take 0.5 tablets (80 mg total) by mouth daily. Patient not taking: Reported on 03/06/2022 02/23/22   Royal Hawthorn, NP    No current facility-administered medications for this encounter.   Current Outpatient Medications  Medication Sig Dispense Refill   acetaminophen (TYLENOL) 500 MG tablet Take 1,000 mg by mouth 2 (two) times daily as needed for mild pain.     atorvastatin (LIPITOR) 20 MG tablet TAKE 1 TABLET DAILY (Patient taking differently: Take 20 mg by mouth at bedtime.) 90 tablet 3   Cholecalciferol (VITAMIN D) 50 MCG (2000 UT) CAPS Take 2,000 Units by mouth in the morning.     cloNIDine (CATAPRES) 0.1 MG tablet Take 1 tablet (0.1 mg total) by mouth daily as needed. If SBP >180 only (Patient taking differently: Take 0.1 mg by mouth as needed (if SBP > 180).) 30 tablet 0   digoxin (LANOXIN) 0.125 MG tablet TAKE 1 TABLET DAILY (Patient taking differently: Take 0.125 mg by mouth daily.) 90 tablet 3   Iron, Ferrous Sulfate, 325 (65 Fe) MG TABS Take 325 mg by mouth  daily. 30 tablet 0   leflunomide (ARAVA) 20 MG tablet Take 20 mg by mouth in the morning.     levalbuterol (XOPENEX) 0.63 MG/3ML nebulizer solution Take 3 mLs (0.63 mg total) by nebulization in  the morning and at bedtime. (Patient taking differently: Take 0.63 mg by nebulization every 6 (six) hours as needed for wheezing (cough).) 3 mL 12   levothyroxine (SYNTHROID) 100 MCG tablet Take 1 tablet (100 mcg total) by mouth daily before breakfast. 90 tablet 2   metoprolol succinate (TOPROL-XL) 50 MG 24 hr tablet TAKE 1 TABLET DAILY (Patient taking differently: Take 50 mg by mouth daily.) 90 tablet 3   mirtazapine (REMERON) 15 MG tablet TAKE 1 TABLET AT BEDTIME (Patient taking differently: Take 15 mg by mouth at bedtime.) 90 tablet 3   Multiple Vitamin (MULTIVITAMIN) tablet Take 1 tablet by mouth in the morning.     Multiple Vitamins-Minerals (PRESERVISION AREDS 2) CAPS Take 1 capsule by mouth 2 (two) times daily.     NON FORMULARY Fluid restriction 1800cc daily  1st- 900 cc 2nd- 700 cc 3rd- 200 cc     omeprazole (PRILOSEC) 40 MG capsule TAKE 1 CAPSULE DAILY (Patient taking differently: Take 40 mg by mouth daily. Take 30 minutes before breakfast on a empty stomach) 90 capsule 3   ondansetron (ZOFRAN) 4 MG tablet Take 4 mg by mouth every 6 (six) hours as needed for nausea or vomiting.     PRESCRIPTION MEDICATION Take 1 g by mouth daily. Sodium 1 g     shark liver oil-cocoa butter (PREPARATION H) 0.25-3-85.5 % suppository Place 1 suppository rectally at bedtime. For 7 days starting 03/02/22     sodium chloride 1 g tablet Take 1 g by mouth in the morning.     valsartan (DIOVAN) 80 MG tablet Take 60 mg by mouth at bedtime. Increase valsartan to 60 mg by mouth daily and report if BP is not consistently <150/90 per [facility]     Vibegron (GEMTESA) 75 MG TABS Take 32.5 mg by mouth at bedtime. 30 tablet 5   vitamin B-12 (CYANOCOBALAMIN) 1000 MCG tablet Take 1,000 mcg by mouth in the morning.     hydrocortisone  (ANUSOL-HC) 25 MG suppository Place 1 suppository (25 mg total) rectally daily. (Patient not taking: Reported on 03/06/2022) 7 suppository 0   valsartan (DIOVAN) 160 MG tablet Take 0.5 tablets (80 mg total) by mouth daily. (Patient not taking: Reported on 03/06/2022) 30 tablet 0    Allergies as of 03/06/2022 - Review Complete 03/06/2022  Allergen Reaction Noted   Claritin [loratadine] Other (See Comments)    Codeine Other (See Comments) 03/30/2015   Gabapentin Other (See Comments) 09/12/2020   Molds & smuts Other (See Comments) 07/02/2011   Pollen extract Swelling 10/07/2017   Bee venom Swelling and Rash 03/30/2015   Wasp venom Swelling and Rash     Family History  Problem Relation Age of Onset   Lung disease Mother 70   Heart disease Father 21   Colon polyps Daughter    Alcohol abuse Son    Colon cancer Neg Hx    Stomach cancer Neg Hx    Rectal cancer Neg Hx    Crohn's disease Neg Hx    Esophageal cancer Neg Hx     Social History   Socioeconomic History   Marital status: Widowed    Spouse name: Not on file   Number of children: Not on file   Years of education: Not on file   Highest education level: Not on file  Occupational History   Occupation: elementary school teacher    Comment: retired   Tobacco Use   Smoking status: Never    Passive exposure: Past ("mother smoked")  Smokeless tobacco: Never  Vaping Use   Vaping Use: Never used  Substance and Sexual Activity   Alcohol use: Yes    Alcohol/week: 1.0 standard drink of alcohol    Types: 1 Glasses of wine per week    Comment: 1-2 per week   Drug use: No   Sexual activity: Not Currently  Other Topics Concern   Not on file  Social History Narrative   Social History      Diet? Healthy- low salt, sugar, fat      Do you drink/eat things with caffeine? On occasion      Marital status?        widow                        What year were you married? 1958      Do you live in a house, apartment, assisted  living, condo, trailer, etc.? apartment      Is it one or more stories? one      How many persons live in your home? one      Do you have any pets in your home? (please list) no      Highest level of education completed? 4 year college      Current or past profession: Statistician      Do you exercise?             yes                         Type & how often? Classes- senior retirement community/ some walking      Advanced Directives      Do you have a living will?      Do you have a DNR form?                                  If not, do you want to discuss one?      Do you have signed POA/HPOA for forms?       Functional Status Completed by: Daughter, Di Kindle      Do you have difficulty bathing or dressing yourself? no      Do you have difficulty preparing food or eating? no      Do you have difficulty managing your medications? no      Do you have difficulty managing your finances? no      Do you have difficulty affording your medications? no   Right handed   [facility]   Social Determinants of Health   Financial Resource Strain: Low Risk  (01/28/2018)   Overall Financial Resource Strain (CARDIA)    Difficulty of Paying Living Expenses: Not hard at all  Food Insecurity: No Food Insecurity (12/21/2021)   Hunger Vital Sign    Worried About Running Out of Food in the Last Year: Never true    Ran Out of Food in the Last Year: Never true  Transportation Needs: No Transportation Needs (12/21/2021)   PRAPARE - Hydrologist (Medical): No    Lack of Transportation (Non-Medical): No  Physical Activity: Sufficiently Active (01/28/2018)   Exercise Vital Sign    Days of Exercise per Week: 6 days    Minutes of Exercise per Session: 30 min  Stress: No Stress Concern Present (01/28/2018)   [place] -  Occupational Stress Questionnaire    Feeling of Stress : Only a little  Social Connections: Moderately  Isolated (01/28/2018)   Social Connection and Isolation Panel [NHANES]    Frequency of Communication with Friends and Family: More than three times a week    Frequency of Social Gatherings with Friends and Family: More than three times a week    Attends Religious Services: Never    Marine scientist or Organizations: No    Attends Archivist Meetings: Never    Marital Status: Widowed  Intimate Partner Violence: Not At Risk (12/21/2021)   Humiliation, Afraid, Rape, and Kick questionnaire    Fear of Current or Ex-Partner: No    Emotionally Abused: No    Physically Abused: No    Sexually Abused: No    Review of Systems: Pertinent positive and negative review of systems were noted in the above HPI section.  All other review of systems was otherwise negative.   Physical Exam: Vital signs in last 24 hours: Temp:  [97.9 F (36.6 C)] 97.9 F (36.6 C) (12/12 1254) Pulse Rate:  [58-96] 96 (12/12 1600) Resp:  [18-23] 23 (12/12 1600) BP: (126-157)/(61-91) 157/91 (12/12 1600) SpO2:  [92 %-100 %] 92 % (12/12 1600) Weight:  [55.5 kg] 55.5 kg (12/12 1445)   General:   Alert,  Well-developed, elderly white female pleasant and cooperative in NAD-family at bedside Head:  Normocephalic and atraumatic. Eyes:  Sclera clear, no icterus.   Conjunctiva pale Ears:  Normal auditory acuity. Nose:  No deformity, discharge,  or lesions. Mouth:  No deformity or lesions.   Neck:  Supple; no masses or thyromegaly. Lungs:  Clear throughout to auscultation.   No wheezes, crackles, or rhonchi. Heart:  irRegular rate and rhythm; no murmurs, clicks, rubs,  or gallops. Abdomen:  Soft,nontender, BS active,nonpalp mass or hsm.   Rectal: Not done-per ER MD with no bright red blood or melena on glove, daughter has picture of dark maroonish bowel movement in commode from last p.m. Msk:  Symmetrical without gross deformities. . Pulses:  Normal pulses noted. Extremities:  Without clubbing or  edema. Neurologic:  Alert and  oriented x3  grossly normal neurologically. Skin:  Intact without significant lesions or rashes.. Psych:  Alert and cooperative. Normal mood and affect.  Intake/Output from previous day: No intake/output data recorded. Intake/Output this shift: Total I/O In: 500 [IV Piggyback:500] Out: -   Lab Results: Recent Labs    03/06/22 1306  WBC 9.4  HGB 8.3*  HCT 25.4*  PLT 193   BMET Recent Labs    03/06/22 1306  NA 136  K 4.3  CL 102  CO2 27  GLUCOSE 119*  BUN 23  CREATININE 0.78  CALCIUM 8.8*   LFT Recent Labs    03/06/22 1306  PROT 6.6  ALBUMIN 3.8  AST 18  ALT 17  ALKPHOS 76  BILITOT 0.6   PT/INR Recent Labs    03/06/22 1306  LABPROT 13.5  INR 1.0   Hepatitis Panel No results for input(s): "HEPBSAG", "HCVAB", "HEPAIGM", "HEPBIGM" in the last 72 hours.   IMPRESSION:  #87 88 year old white female with initial diagnosis of adenocarcinoma of the colon in 2006 for which she underwent segmental resection of the transverse colon. Patient underwent evaluation November 2022 for iron deficiency anemia and heme positive stool and was found to have a fungating partially obstructing mass at the splenic flexure, biopsies positive for well-differentiated invasive adenocarcinoma.  She also had a 25 mm polyp  removed from the transverse colon which was a sessile serrated polyp.  Family opted for nonsurgical management, she completed 4 cycles of immunotherapy June 2023. Follow-up colonoscopy in June 2023 showed no evidence of residual tumor, multiple diverticuli in the left colon, and small internal hemorrhoids.  Immunotherapy stopped July 2023 due to poor tolerance  Patient has had persistent anemia since but no evidence of overt bleeding until 4 to 5 days ago when she started passing dark bloody stools. She has had a about a 1.5 g drop in hemoglobin since that time with hemoglobin 8.3 on admission today  Etiology of her bleeding is not  clear at this time, this could represent diverticular bleeding, although significant concern for recurrence of colonic neoplasm.  #2 fatigue and poor appetite over the past several weeks  #3 new confusion over the past week-intermittent #4 history of atrial fibrillation-not on anticoagulation due to high fall risk #5 prior history of CVA/TIA #6 history of rheumatoid arthritis #7.  Fall  with scalp hematoma September 2023   PLAN: Long discussion with patient's daughter and son-in-law and patient at bedside today.  Family is not certain they want to be aggressive with management at present.  Discussed potential need for colonoscopy for definite diagnosis. Has not had any recent imaging, and CT abdomen and pelvis and perhaps further staging imaging, will be helpful.  She is not bleeding actively enough at present for CTA to be helpful.  They are agreeable to CT of the abdomen and pelvis which has been ordered.  They would like to have that information prior to making any decisions about possible colonoscopy.  Will place her on a full liquid diet  Serial q6 to 8-hour hemoglobins and transfuse for hemoglobin 7.5 or less. GI will follow, further recommendations pending findings at CT imaging.   Iyonnah Ferrante PA-C 03/06/2022, 4:33 PM

## 2022-03-06 NOTE — Progress Notes (Unsigned)
Abstract  

## 2022-03-07 ENCOUNTER — Encounter: Payer: Medicare Other | Admitting: Internal Medicine

## 2022-03-07 ENCOUNTER — Encounter: Payer: Self-pay | Admitting: Neurology

## 2022-03-07 DIAGNOSIS — R1111 Vomiting without nausea: Secondary | ICD-10-CM | POA: Diagnosis not present

## 2022-03-07 DIAGNOSIS — E039 Hypothyroidism, unspecified: Secondary | ICD-10-CM | POA: Diagnosis present

## 2022-03-07 DIAGNOSIS — Z7901 Long term (current) use of anticoagulants: Secondary | ICD-10-CM | POA: Diagnosis not present

## 2022-03-07 DIAGNOSIS — C185 Malignant neoplasm of splenic flexure: Secondary | ICD-10-CM

## 2022-03-07 DIAGNOSIS — K5790 Diverticulosis of intestine, part unspecified, without perforation or abscess without bleeding: Secondary | ICD-10-CM | POA: Diagnosis not present

## 2022-03-07 DIAGNOSIS — K920 Hematemesis: Secondary | ICD-10-CM | POA: Diagnosis not present

## 2022-03-07 DIAGNOSIS — B961 Klebsiella pneumoniae [K. pneumoniae] as the cause of diseases classified elsewhere: Secondary | ICD-10-CM | POA: Diagnosis present

## 2022-03-07 DIAGNOSIS — K5731 Diverticulosis of large intestine without perforation or abscess with bleeding: Secondary | ICD-10-CM | POA: Diagnosis present

## 2022-03-07 DIAGNOSIS — K219 Gastro-esophageal reflux disease without esophagitis: Secondary | ICD-10-CM | POA: Diagnosis present

## 2022-03-07 DIAGNOSIS — Z8673 Personal history of transient ischemic attack (TIA), and cerebral infarction without residual deficits: Secondary | ICD-10-CM | POA: Diagnosis not present

## 2022-03-07 DIAGNOSIS — I480 Paroxysmal atrial fibrillation: Secondary | ICD-10-CM | POA: Diagnosis present

## 2022-03-07 DIAGNOSIS — D539 Nutritional anemia, unspecified: Secondary | ICD-10-CM | POA: Diagnosis present

## 2022-03-07 DIAGNOSIS — M069 Rheumatoid arthritis, unspecified: Secondary | ICD-10-CM | POA: Diagnosis present

## 2022-03-07 DIAGNOSIS — D62 Acute posthemorrhagic anemia: Secondary | ICD-10-CM | POA: Diagnosis present

## 2022-03-07 DIAGNOSIS — K625 Hemorrhage of anus and rectum: Secondary | ICD-10-CM | POA: Diagnosis present

## 2022-03-07 DIAGNOSIS — N390 Urinary tract infection, site not specified: Secondary | ICD-10-CM | POA: Diagnosis present

## 2022-03-07 DIAGNOSIS — R58 Hemorrhage, not elsewhere classified: Secondary | ICD-10-CM | POA: Diagnosis not present

## 2022-03-07 DIAGNOSIS — Z515 Encounter for palliative care: Secondary | ICD-10-CM

## 2022-03-07 DIAGNOSIS — W19XXXD Unspecified fall, subsequent encounter: Secondary | ICD-10-CM | POA: Diagnosis present

## 2022-03-07 DIAGNOSIS — F0150 Vascular dementia without behavioral disturbance: Secondary | ICD-10-CM | POA: Diagnosis present

## 2022-03-07 DIAGNOSIS — S82831D Other fracture of upper and lower end of right fibula, subsequent encounter for closed fracture with routine healing: Secondary | ICD-10-CM | POA: Diagnosis not present

## 2022-03-07 DIAGNOSIS — I10 Essential (primary) hypertension: Secondary | ICD-10-CM | POA: Diagnosis present

## 2022-03-07 DIAGNOSIS — Z7189 Other specified counseling: Secondary | ICD-10-CM | POA: Diagnosis not present

## 2022-03-07 DIAGNOSIS — K922 Gastrointestinal hemorrhage, unspecified: Secondary | ICD-10-CM | POA: Diagnosis not present

## 2022-03-07 DIAGNOSIS — Z66 Do not resuscitate: Secondary | ICD-10-CM | POA: Diagnosis present

## 2022-03-07 DIAGNOSIS — I4821 Permanent atrial fibrillation: Secondary | ICD-10-CM | POA: Diagnosis present

## 2022-03-07 DIAGNOSIS — Z85038 Personal history of other malignant neoplasm of large intestine: Secondary | ICD-10-CM | POA: Diagnosis not present

## 2022-03-07 DIAGNOSIS — Z7401 Bed confinement status: Secondary | ICD-10-CM | POA: Diagnosis not present

## 2022-03-07 DIAGNOSIS — E782 Mixed hyperlipidemia: Secondary | ICD-10-CM | POA: Diagnosis present

## 2022-03-07 DIAGNOSIS — G2581 Restless legs syndrome: Secondary | ICD-10-CM | POA: Diagnosis present

## 2022-03-07 DIAGNOSIS — Z888 Allergy status to other drugs, medicaments and biological substances status: Secondary | ICD-10-CM | POA: Diagnosis not present

## 2022-03-07 DIAGNOSIS — E871 Hypo-osmolality and hyponatremia: Secondary | ICD-10-CM | POA: Diagnosis present

## 2022-03-07 DIAGNOSIS — R531 Weakness: Secondary | ICD-10-CM | POA: Diagnosis not present

## 2022-03-07 DIAGNOSIS — E861 Hypovolemia: Secondary | ICD-10-CM | POA: Diagnosis present

## 2022-03-07 DIAGNOSIS — Z8249 Family history of ischemic heart disease and other diseases of the circulatory system: Secondary | ICD-10-CM | POA: Diagnosis not present

## 2022-03-07 DIAGNOSIS — Z885 Allergy status to narcotic agent status: Secondary | ICD-10-CM | POA: Diagnosis not present

## 2022-03-07 LAB — HEMOGLOBIN AND HEMATOCRIT, BLOOD
HCT: 26.9 % — ABNORMAL LOW (ref 36.0–46.0)
HCT: 26.3 % — ABNORMAL LOW (ref 36.0–46.0)
Hemoglobin: 8.9 g/dL — ABNORMAL LOW (ref 12.0–15.0)
Hemoglobin: 8.8 g/dL — ABNORMAL LOW (ref 12.0–15.0)

## 2022-03-07 LAB — CBC
HCT: 22.8 % — ABNORMAL LOW (ref 36.0–46.0)
Hemoglobin: 7.5 g/dL — ABNORMAL LOW (ref 12.0–15.0)
MCH: 34.1 pg — ABNORMAL HIGH (ref 26.0–34.0)
MCHC: 32.9 g/dL (ref 30.0–36.0)
MCV: 103.6 fL — ABNORMAL HIGH (ref 80.0–100.0)
Platelets: 189 10*3/uL (ref 150–400)
RBC: 2.2 MIL/uL — ABNORMAL LOW (ref 3.87–5.11)
RDW: 15.6 % — ABNORMAL HIGH (ref 11.5–15.5)
WBC: 9 10*3/uL (ref 4.0–10.5)
nRBC: 0 % (ref 0.0–0.2)

## 2022-03-07 LAB — COMPREHENSIVE METABOLIC PANEL
ALT: 16 U/L (ref 0–44)
AST: 22 U/L (ref 15–41)
Albumin: 3.1 g/dL — ABNORMAL LOW (ref 3.5–5.0)
Alkaline Phosphatase: 66 U/L (ref 38–126)
Anion gap: 8 (ref 5–15)
BUN: 18 mg/dL (ref 8–23)
CO2: 22 mmol/L (ref 22–32)
Calcium: 8.7 mg/dL — ABNORMAL LOW (ref 8.9–10.3)
Chloride: 103 mmol/L (ref 98–111)
Creatinine, Ser: 0.68 mg/dL (ref 0.44–1.00)
GFR, Estimated: >60 mL/min (ref >60)
Glucose, Bld: 100 mg/dL — ABNORMAL HIGH (ref 70–99)
Potassium: 4.6 mmol/L (ref 3.5–5.1)
Sodium: 133 mmol/L — ABNORMAL LOW (ref 135–145)
Total Bilirubin: 0.7 mg/dL (ref 0.3–1.2)
Total Protein: 5.8 g/dL — ABNORMAL LOW (ref 6.5–8.1)

## 2022-03-07 LAB — PREPARE RBC (CROSSMATCH)

## 2022-03-07 MED ORDER — ONDANSETRON HCL 4 MG/2ML IJ SOLN: 4.0000 mg | Freq: Once | INTRAMUSCULAR | Status: DC

## 2022-03-07 MED ORDER — LEFLUNOMIDE 10 MG PO TABS
20.0000 mg | ORAL_TABLET | Freq: Every day | ORAL | Status: DC
  Administered 2022-03-07 – 2022-03-10 (×4): 20 mg via ORAL
  Filled 2022-03-07 (×4): qty 2

## 2022-03-07 MED ORDER — CLONIDINE HCL 0.1 MG PO TABS: 0.1000 mg | ORAL_TABLET | ORAL | Status: DC | PRN

## 2022-03-07 MED ORDER — SODIUM CHLORIDE 0.9% IV SOLUTION: Freq: Once | INTRAVENOUS | Status: DC

## 2022-03-07 MED ORDER — BOOST / RESOURCE BREEZE PO LIQD CUSTOM
1.0000 | Freq: Three times a day (TID) | ORAL | Status: DC
  Administered 2022-03-07 – 2022-03-10 (×9): 1 via ORAL

## 2022-03-07 MED ORDER — ONDANSETRON HCL 4 MG/2ML IJ SOLN
4.0000 mg | Freq: Four times a day (QID) | INTRAMUSCULAR | Status: DC | PRN
  Administered 2022-03-07: 4 mg via INTRAVENOUS
  Filled 2022-03-07: qty 2

## 2022-03-07 MED ORDER — SODIUM CHLORIDE 0.9 % IV SOLN
1.0000 g | INTRAVENOUS | Status: DC
  Administered 2022-03-07 – 2022-03-08 (×2): 1 g via INTRAVENOUS
  Filled 2022-03-07 (×3): qty 10

## 2022-03-07 MED ORDER — MIRABEGRON ER 25 MG PO TB24
25.0000 mg | ORAL_TABLET | Freq: Every day | ORAL | Status: DC
  Administered 2022-03-07 – 2022-03-10 (×4): 25 mg via ORAL
  Filled 2022-03-07 (×4): qty 1

## 2022-03-07 MED ORDER — ACETAMINOPHEN 325 MG PO TABS
650.0000 mg | ORAL_TABLET | Freq: Four times a day (QID) | ORAL | Status: DC | PRN
  Administered 2022-03-07 – 2022-03-08 (×2): 650 mg via ORAL
  Filled 2022-03-07 (×2): qty 2

## 2022-03-07 MED ORDER — MELATONIN 3 MG PO TABS
3.0000 mg | ORAL_TABLET | Freq: Every day | ORAL | Status: DC
  Administered 2022-03-07 – 2022-03-09 (×3): 3 mg via ORAL
  Filled 2022-03-07 (×3): qty 1

## 2022-03-07 NOTE — Progress Notes (Signed)
IP PROGRESS NOTE  Subjective:   Kathy Velez is known to me with a history of colon cancer treated with pembrolizumab this year.  A colonoscopy 09/12/2021 revealed no residual tumor colon. Kathy Velez now followed off of specific therapy for colon cancer.  She presented to the emergency room yesterday with rectal bleeding.  The hemoglobin returned at 8.3, lower than on recent outpatient determinations.  She was admitted for further evaluation.  Gastroenterology was consulted.  A CT abdomen/pelvis revealed no evidence of a colon mass or acute diverticulitis.  She continues to have rectal bleeding per the bedside RN.  She received 1 unit of packed red blood cells this morning. Objective: Vital signs in last 24 hours: Blood pressure (!) 192/96, pulse 96, temperature 97.9 F (36.6 C), temperature source Oral, resp. rate 17, height _0  (1.575 m), weight 122 lb 6.4 oz (55.5 kg), SpO2 95 %.  Intake/Output from previous day: 12/12 0701 - 12/13 0700 In: 4 [P.O.:240; IV Piggyback:500] Out: 37 [Urine:725]  Physical Exam:  HEENT: Thrush, the mouth is dry Lungs: Scattered bilateral rhonchi and wheezes, no respiratory distress Cardiac: Irregular Abdomen: Mildly distended, no hepatosplenomegaly, nontender Extremities: No leg edema Neurologic: Alert, not oriented to place or diagnosis   Lab Results: Recent Labs    03/06/22 1306 03/06/22 1930 03/07/22 0100 03/07/22 1018  WBC 9.4  --  9.0  --   HGB 8.3*   < > 7.5* 8.9*  HCT 25.4*   < > 22.8* 26.9*  PLT 193  --  189  --    < > = values in this interval not displayed.    BMET Recent Labs    03/06/22 1306 03/07/22 0100  NA 136 133*  K 4.3 4.6  CL 102 103  CO2 27 22  GLUCOSE 119* 100*  BUN 23 18  CREATININE 0.78 0.68  CALCIUM 8.8* 8.7*    Lab Results  Component Value Date   CEA 3.83 05/16/2021    Studies/Results: CT ABDOMEN PELVIS W CONTRAST  Result Date: 03/06/2022 CLINICAL DATA:  Lower GI bleed Colon cancer, staging.  "Pt BIB [facility] from [facility] for rectal bleeding. Pt has a hx on colon CA, vascular dementia " EXAM: CT ABDOMEN AND PELVIS WITH CONTRAST TECHNIQUE: Multidetector CT imaging of the abdomen and pelvis was performed using the standard protocol following bolus administration of intravenous contrast. RADIATION DOSE REDUCTION: This exam was performed according to the departmental dose-optimization program which includes automated exposure control, adjustment of the mA and/or kV according to patient size and/or use of iterative reconstruction technique. CONTRAST:  173m OMNIPAQUE IOHEXOL 300 MG/ML  SOLN COMPARISON:  CT chest abdomen pelvis 03/31/2021, CT abdomen pelvis 10/27/2018 FINDINGS: Lower chest: Mild reticulations. Status post bicuspid and tricuspid valve replacements. No acute abnormality. Hepatobiliary: No focal liver abnormality. No gallstones, gallbladder wall thickening, or pericholecystic fluid. No biliary dilatation. Pancreas: No focal lesion. Normal pancreatic contour. No surrounding inflammatory changes. No main pancreatic ductal dilatation. Spleen: Normal in size without focal abnormality. Adrenals/Urinary Tract: No adrenal nodule bilaterally. Bilateral kidneys enhance symmetrically. Fluid density lesion within the right kidney likely represents a simple renal cyst. Simple renal cysts, in the absence of clinically indicated signs/symptoms, require no independent follow-up. No hydronephrosis. No hydroureter. The urinary bladder is unremarkable. On delayed imaging, there is no urothelial wall thickening and there are no filling defects in the opacified portions of the bilateral collecting systems or ureters. Stomach/Bowel: Partial colectomy. Stomach is within normal limits. No evidence of bowel  wall thickening or dilatation. Under distension of the rectosigmoid colon. Colonic diverticulosis. Stool throughout the colon. Vascular/Lymphatic: No abdominal aorta or iliac aneurysm. Severe  atherosclerotic plaque of the aorta and its branches. No abdominal, pelvic, or inguinal lymphadenopathy. Reproductive: Status post hysterectomy. No adnexal masses. Other: No intraperitoneal free fluid. No intraperitoneal free gas. No organized fluid collection. Musculoskeletal: No abdominal wall hernia or abnormality. No suspicious lytic or blastic osseous lesions. No acute displaced fracture. Multilevel degenerative changes of the spine with grade 1 anterolisthesis of L3 on L4 and grade 2 anterolisthesis of L4 on L5. Mild retrolisthesis of T10 on T11 and T11 on T12. Multilevel intervertebral disc space narrowing. L3-L5 posterolateral fusion surgical hardware. IMPRESSION: 1. Colonic diverticulosis with no acute diverticulitis. 2. Partial colectomy. 3.  Aortic Atherosclerosis (ICD10-I70.0). Electronically Signed   By: Iven Finn M.D.   On: 03/06/2022 17:37    Medications: I have reviewed the patient's current medications.  Assessment/Plan: Colon cancer-splenic flexure mass on colonoscopy 02/22/2021 Biopsy 02/22/2021-invasive well differentiated adenocarcinoma, loss of MLH1 and PMS2 expression, MSI high, MLH1 hyper methylation present CTs 03/31/2021-no evidence of metastatic disease, possible mass at the splenic flexure, 4 mm left upper lung nodule, bilateral lung scarring Cycle 1 pembrolizumab 06/13/2021 Cycle 2 pembrolizumab 07/04/2021 Cycle 3 pembrolizumab 07/25/2021 Cycle 4 pembrolizumab 08/22/2021 Colonoscopy 09/20/2021-no residual mass at the splenic flexure/descending colon CT abdomen/pelvis 03/06/2022-diverticulosis without evidence of acute diverticulitis, partial colectomy Macrocytic anemia Sessile serrated polyp with focus of high-grade dysplasia on colonoscopy 02/22/2021 Colon cancer 2006, status post a right colectomy-"node positive "cancer per patient, received adjuvant chemotherapy Atrial fibrillation, maintained on anticoagulation History of a CVA and TIA Mild cognitive  impairment Rheumatoid arthritis Hypothyroidism 10.  Chronic lung disease? 42.  Fall with head injury 10/22/2021 Scalp hematoma with bleeding 12/06/2021 12.  01/30/2022-displaced distal right fibular fracture, small displaced posterior malleolar fracture 13.  Admission 03/07/2022 with acute GI bleeding   Kathy Velez is admitted with acute GI bleeding.  The GI bleeding may be related to diverticulitis.  There is no x-ray evidence of a colon mass, but progression of the left-sided colon tumor is possible.  She will continue supportive care to include transfusion support as needed.  She is being followed by gastroenterology.  Ms. Deol has significant confusion today.  She has mild underlying dementia and multiple comorbid conditions.  She may not be a candidate for further treatment if she is found to have progression of the colon cancer.  Outpatient follow-up will be scheduled at the Cancer center.  I am available to discuss her with her family.     LOS: 0 days   Betsy Coder, MD   03/07/2022, 1:48 PM

## 2022-03-07 NOTE — Progress Notes (Signed)
PROGRESS NOTE Kathy Velez  TKP:[id number] DOB: 07-31-1931 DOA: 03/06/2022 PCP: Virgie Dad, MD   Brief Narrative/Hospital Course:  88 y.o. female with medical history significant of colon CA in 2006 status post segmental resection of transverse colon, and had evaluation for anemia on Nov/2022 and found to have a fungating partially obstructing mass at the splenic flexure positive for invasive adenocarcinoma family opted for nonsurgical management-treated with immunotherapy on splenic flexure colon cancer unable to tolerate continued immunotherapy this summer, she was on Eliquis but was stopped within the last few months w/ hx of hypothyroidism, HTN, A fib, GERD, HLD, RA, chronic hyponatremia. Presenting with rectal bleeding. She reports that she noticed BRBPR and clots starting 4 days ago.  Patient seen in the ED, GI was consulted and admitted.  Family had been reluctant for colonoscopy evaluation so CT scan was done to rule out recurrent malignancy-it showed partial colectomy, colonic diverticulosis with no acute diverticulitis. Patient has been having confusion over the past week intermittently.  She has had a fall with a scalp hematoma in September 2023.     Subjective: Seen and examined this morning Daughter at the bedside. Had BM yesterday and none today every time she has a bowel Vamsi gets rectal bleeding Completed 1 unit PRBC this morning Has a cast on her right foot x 1 month. Daughter concerned about patient's intermittent confusion   Blood pressure overnight mostly 140s to 150s this morning 1 time in 190s Labs reviewed hemoglobin drifting 7.5 g> blood transfusion initiated during the night 0300 am, fairly stable CMP Assessment and Plan: Principal Problem:   GIB (gastrointestinal bleeding) Active Problems:   Essential hypertension   Rheumatoid arthritis (HCC)   Hypothyroidism   Mixed hyperlipidemia   GERD without esophagitis   Permanent atrial fibrillation (HCC)   Colon  cancer (HCC)  Rectal bleeding/GI bleeding: CT abdomen with no recurrence of cancer.  Getting blood transfusion support GI following closely.  Tolerating diet.  Acute blood loss anemia due to rectal bleeding: Getting 1 unit PRBC 12/13.  Monitor and transfusion support to be continued Recent Labs  Lab 03/02/22 0000 03/06/22 1306 03/06/22 1930 03/07/22 0100 03/07/22 1018  HGB 9.9* 8.3* 8.2* 7.5* 8.9*  HCT 28* 25.4* 25.7* 22.8* 26.9*    History of colon cancer:colon CA in 2006 status post segmental resection of transverse colon, and had evaluation for anemia on Nov/2022 and found to have a fungating partially obstructing mass at the splenic flexure positive for invasive adenocarcinoma family opted for nonsurgical management-treated with immunotherapy on splenic flexure colon cancer unable to tolerate continued immunotherapy this summer.  CT abdomen on admission does not show recurrence of cancer.  Intermittent confusion past weeks: Previous similar episodes with UTI, UA does appear grossly abnormal send urine for culture add empiric ceftriaxone.  Suspected UTI continue to biotics as above  Hypothyroidism:continue home regimen when confirmed  HTN: bp poorly controlled, continue her home Avapro, metoprolol.  Resume home  prn clonidine.   Hx of A-fib : Continue metoprolol, does not appear to be anticoagulated   GERD continue PPI HLD continue statin. RA: Continue home leflunomide  Recent right ankle fracture currently cast in place for 1 month, she has appointment for follow-up removal cast and further care on December 28 currently on nonweightbearing status  DVT prophylaxis: SCDs Start: 03/06/22 1733 Code Status:   Code Status: DNR Family Communication: plan of care discussed with patient/daughter at bedside. Patient status is: Inpatient because of bleeding and UTI Level of care: Telemetry  Dispo: The patient is from: home            Anticipated disposition: Home Objective: Vitals  last 24 hrs: Vitals:   03/07/22 0039 03/07/22 0344 03/07/22 0403 03/07/22 0800  BP: (!) 158/57 (!) 157/88 (!) 152/91 (!) 192/96  Pulse: 89 (!) 57 99 96  Resp: '20 16 16 17  '$ Temp: 97.9 F (36.6 C) 97.7 F (36.5 C) 98.6 F (37 C) 97.9 F (36.6 C)  TempSrc: Oral Oral Oral Oral  SpO2: 97% 97% 97% 95%  Weight:      Height:       Weight change:   Physical Examination: General exam: alert awake, older than stated age HEENT:Oral mucosa moist, Ear/Nose WNL grossly Respiratory system: bilaterally clkear BS, no use of accessory muscle Cardiovascular system: S1 & S2 +, No JVD. Gastrointestinal system: Abdomen soft,NT,ND, BS+ Nervous System:Alert, awake, moving extremities. Extremities: LE edema net, rt ankle /w cast Skin: No rashes,no icterus. MSK: Normal muscle bulk,tone, power  Medications reviewed:  Scheduled Meds:  sodium chloride   Intravenous Once   atorvastatin  20 mg Oral QHS   cholecalciferol  2,000 Units Oral Daily   digoxin  0.125 mg Oral Daily   feeding supplement  1 Container Oral TID BM   irbesartan  75 mg Oral QHS   levothyroxine  100 mcg Oral Q0600   metoprolol succinate  50 mg Oral Daily   mirtazapine  15 mg Oral QHS   multivitamin with minerals  1 tablet Oral Daily   ondansetron (ZOFRAN) IV  4 mg Intravenous Once   pantoprazole  80 mg Oral q morning   sodium chloride  1 g Oral Daily   Continuous Infusions:  cefTRIAXone (ROCEPHIN)  IV 1 g (03/07/22 1125)      Diet Order             Diet full liquid Room service appropriate? Yes; Fluid consistency: Thin  Diet effective now                  Intake/Output Summary (Last 24 hours) at 03/07/2022 1317 Last data filed at 03/07/2022 1258 Gross per 24 hour  Intake 1304 ml  Output 1525 ml  Net -221 ml   Net IO Since Admission: -221 mL [03/07/22 1317]  Wt Readings from Last 3 Encounters:  03/06/22 55.5 kg  03/02/22 55.5 kg  02/23/22 55.8 kg     Unresulted Labs (From admission, onward)     Start      Ordered   03/07/22 1103  CEA  Once,   R       Question:  Specimen collection method  Answer:  Lab=Lab collect   03/07/22 1102   03/07/22 0948  Urine Culture  (Urine Culture)  Once,   R       Question:  Indication  Answer:  Altered mental status (if no other cause identified)   03/07/22 0947   03/06/22 1632  Hemoglobin and hematocrit, blood  Now then every 8 hours,   R     Comments: Call MD for hgb 7.5 or less for transfusion orders    03/06/22 1632          Data Reviewed: I have personally reviewed following labs and imaging studies CBC: Recent Labs  Lab 03/02/22 0000 03/06/22 1306 03/06/22 1930 03/07/22 0100 03/07/22 1018  WBC 5.3 9.4  --  9.0  --   NEUTROABS  --  6.9  --   --   --   HGB  9.9* 8.3* 8.2* 7.5* 8.9*  HCT 28* 25.4* 25.7* 22.8* 26.9*  MCV  --  104.1*  --  103.6*  --   PLT 169 193  --  189  --    Basic Metabolic Panel: Recent Labs  Lab 03/06/22 1306 03/07/22 0100  NA 136 133*  K 4.3 4.6  CL 102 103  CO2 27 22  GLUCOSE 119* 100*  BUN 23 18  CREATININE 0.78 0.68  CALCIUM 8.8* 8.7*  No results found for this or any previous visit (from the past 240 hour(s)).  Antimicrobials: Anti-infectives (From admission, onward)    Start     Dose/Rate Route Frequency Ordered Stop   03/07/22 1000  cefTRIAXone (ROCEPHIN) 1 g in sodium chloride 0.9 % 100 mL IVPB        1 g 200 mL/hr over 30 Minutes Intravenous Every 24 hours 03/07/22 0947        Culture/Microbiology    Component Value Date/Time   SDES  10/27/2021 1320    URINE, CLEAN CATCH Performed at [facility], [address], [place], [place] [address]    [facility]  10/27/2021 1320    NONE Performed at [facility], [address] [place], [place] [address]    CULT >=100,000 COLONIES/mL ESCHERICHIA COLI (A) 10/27/2021 1320   REPTSTATUS 10/29/2021 FINAL 10/27/2021 1320  Radiology Studies: CT ABDOMEN PELVIS W CONTRAST  Result Date: 03/06/2022 CLINICAL DATA:   Lower GI bleed Colon cancer, staging. "Pt BIB [facility] from [facility] for rectal bleeding. Pt has a hx on colon CA, vascular dementia " EXAM: CT ABDOMEN AND PELVIS WITH CONTRAST TECHNIQUE: Multidetector CT imaging of the abdomen and pelvis was performed using the standard protocol following bolus administration of intravenous contrast. RADIATION DOSE REDUCTION: This exam was performed according to the departmental dose-optimization program which includes automated exposure control, adjustment of the mA and/or kV according to patient size and/or use of iterative reconstruction technique. CONTRAST:  118m OMNIPAQUE IOHEXOL 300 MG/ML  SOLN COMPARISON:  CT chest abdomen pelvis 03/31/2021, CT abdomen pelvis 10/27/2018 FINDINGS: Lower chest: Mild reticulations. Status post bicuspid and tricuspid valve replacements. No acute abnormality. Hepatobiliary: No focal liver abnormality. No gallstones, gallbladder wall thickening, or pericholecystic fluid. No biliary dilatation. Pancreas: No focal lesion. Normal pancreatic contour. No surrounding inflammatory changes. No main pancreatic ductal dilatation. Spleen: Normal in size without focal abnormality. Adrenals/Urinary Tract: No adrenal nodule bilaterally. Bilateral kidneys enhance symmetrically. Fluid density lesion within the right kidney likely represents a simple renal cyst. Simple renal cysts, in the absence of clinically indicated signs/symptoms, require no independent follow-up. No hydronephrosis. No hydroureter. The urinary bladder is unremarkable. On delayed imaging, there is no urothelial wall thickening and there are no filling defects in the opacified portions of the bilateral collecting systems or ureters. Stomach/Bowel: Partial colectomy. Stomach is within normal limits. No evidence of bowel wall thickening or dilatation. Under distension of the rectosigmoid colon. Colonic diverticulosis. Stool throughout the colon. Vascular/Lymphatic: No  abdominal aorta or iliac aneurysm. Severe atherosclerotic plaque of the aorta and its branches. No abdominal, pelvic, or inguinal lymphadenopathy. Reproductive: Status post hysterectomy. No adnexal masses. Other: No intraperitoneal free fluid. No intraperitoneal free gas. No organized fluid collection. Musculoskeletal: No abdominal wall hernia or abnormality. No suspicious lytic or blastic osseous lesions. No acute displaced fracture. Multilevel degenerative changes of the spine with grade 1 anterolisthesis of L3 on L4 and grade 2 anterolisthesis of L4 on L5. Mild retrolisthesis of T10 on T11 and  T11 on T12. Multilevel intervertebral disc space narrowing. L3-L5 posterolateral fusion surgical hardware. IMPRESSION: 1. Colonic diverticulosis with no acute diverticulitis. 2. Partial colectomy. 3.  Aortic Atherosclerosis (ICD10-I70.0). Electronically Signed   By: Iven Finn M.D.   On: 03/06/2022 17:37     LOS: 0 days   Antonieta Pert, MD [facility]  03/07/2022, 1:17 PM

## 2022-03-07 NOTE — Progress Notes (Signed)
Spoke to daughter about blood transfusion, 2 RN telephonic consent obtained from daughter at this time. Paper consent in chart

## 2022-03-07 NOTE — Hospital Course (Addendum)
88 y.o. female with medical history significant of colon CA in 2006 status post segmental resection of transverse colon, and had evaluation for anemia on Nov/2022 and found to have a fungating partially obstructing mass at the splenic flexure positive for invasive adenocarcinoma family opted for nonsurgical management-treated with immunotherapy on splenic flexure colon cancer unable to tolerate continued immunotherapy this summer, she was on Eliquis but was stopped within the last few months w/ hx of hypothyroidism, HTN, A fib, GERD, HLD, RA, chronic hyponatremia. Presenting with rectal bleeding. She reports that she noticed BRBPR and clots starting 4 days ago.  Patient seen in the ED, GI was consulted and admitted.  Family had been reluctant for colonoscopy evaluation so CT scan was done to rule out recurrent malignancy-it showed partial colectomy, colonic diverticulosis with no acute diverticulitis. Patient has been having confusion over the past week intermittently.  She has had a fall with a scalp hematoma in September 2023. No more bleeding rectally hemoglobin stable after 1 unit PRBC transfusion.  Palliative care consulted plan for outpatient follow-up.  She has been having intermittent confusion UA abnormal and being treated for UTI.  Has had low sodium and salt tablet at home dosing increased 12/15

## 2022-03-07 NOTE — Consult Note (Signed)
Consultation Note Date: 03/07/2022   Patient Name: Kathy Velez  DOB: 01-07-1932  MRN: [id number]  Age / Sex: 88 y.o., female  PCP: Virgie Dad, MD Referring Physician: Antonieta Pert, MD  Reason for Consultation: Establishing goals of care  HPI/Patient Profile: 88 y.o. female admitted on 03/06/2022    Clinical Assessment and Goals of Care:  88 yo with history of colon cancer treated with pembrolizumab this year.  A colonoscopy 09/12/2021 revealed no residual tumor colon. Kathy Velez follows with Dr Benay Spice for follow up on colon cancer. She presented to the emergency room with rectal bleeding  Gastroenterology was consulted.  A CT abdomen/pelvis revealed no evidence of a colon mass or acute diverticulitis.She continues to have rectal bleeding per the bedside RN.  She received 1 unit of packed red blood cells in AM on 03-07-22.    Patient is resting in bed, has cast on leg. Daughter at bedside.   Palliative medicine is specialized medical care for people living with serious illness. It focuses on providing relief from the symptoms and stress of a serious illness. The goal is to improve quality of life for both the patient and the family. Goals of care: Broad aims of medical therapy in relation to the patient's values and preferences. Our aim is to provide medical care aimed at enabling patients to achieve the goals that matter most to them, given the circumstances of their particular medical situation and their constraints.   Goals of care discussed with patient and daughter in detail, see below.   HCPOA  Daughter Kathy Velez present at bedside.   SUMMARY OF RECOMMENDATIONS    DNR Monitor hospital course Recommend addition of palliative services at [facility].  Thank you for the consult.   Code Status/Advance Care Planning: DNR   Symptom Management:     Palliative Prophylaxis:   Frequent Pain Assessment   Psycho-social/Spiritual:  Desire for further Chaplaincy support:yes Additional Recommendations: Caregiving  Support/Resources  Prognosis:  Unable to determine  Discharge Planning:  recommend addition of palliative services at [facility].        Primary Diagnoses: Present on Admission:  GIB (gastrointestinal bleeding)  Colon cancer ([place])  Essential hypertension  GERD without esophagitis  Hypothyroidism  Mixed hyperlipidemia  Permanent atrial fibrillation (HCC)  Rheumatoid arthritis ([place])   I have reviewed the medical record, interviewed the patient and family, and examined the patient. The following aspects are pertinent.  Past Medical History:  Diagnosis Date   Allergy    seasonal   Anemia    Anxiety    Arthritis    Back   Asymptomatic menopausal state    Atrial fibrillation ([place])    Cancer ([place]) 2006   Colon.  Basal Cell Skin cancer- right arm   Cataract    removed bilateral   Chronic atrial fibrillation (HCC)    Colon cancer (HCC)    Constipation due to pain medication therapy    after heart surgery   Deficiency of other specified B group vitamins  Diplopia    Disorientation, unspecified    Dysrhythmia    PAF   GERD (gastroesophageal reflux disease)    Heart murmur    Hyperlipidemia    Hypertension    Hypothyroidism    Malignant neoplasm of colon, unspecified ([place])    Other specified diseases of blood and blood-forming organs    Other specified disorders of bone density and structure, unspecified forearm    Other thrombophilia ([place])    per matrix   Overactive bladder    per Matrix   Personal history of other malignant neoplasm of large intestine    Presbycusis, bilateral    per matrix   RA (rheumatoid arthritis) ([place])    Restless leg    Seizures ([place])    after Heart Surgey   Stroke ([place])    TIA- found by neurologist after    Valgus deformity, not elsewhere classified, right knee    per matrix    Vascular dementia, mild, without behavioral disturbance, psychotic disturbance, mood disturbance, and anxiety ([place])    per matrix   Social History   Socioeconomic History   Marital status: Widowed    Spouse name: Not on file   Number of children: Not on file   Years of education: Not on file   Highest education level: Not on file  Occupational History   Occupation: elementary school teacher    Comment: retired   Tobacco Use   Smoking status: Never    Passive exposure: Past ("mother smoked")   Smokeless tobacco: Never  Vaping Use   Vaping Use: Never used  Substance and Sexual Activity   Alcohol use: Yes    Alcohol/week: 1.0 standard drink of alcohol    Types: 1 Glasses of wine per week    Comment: 1-2 per week   Drug use: No   Sexual activity: Not Currently  Other Topics Concern   Not on file  Social History Narrative   Social History      Diet? Healthy- low salt, sugar, fat      Do you drink/eat things with caffeine? On occasion      Marital status?        widow                        What year were you married? 1958      Do you live in a house, apartment, assisted living, condo, trailer, etc.? apartment      Is it one or more stories? one      How many persons live in your home? one      Do you have any pets in your home? (please list) no      Highest level of education completed? 4 year college      Current or past profession: Statistician      Do you exercise?             yes                         Type & how often? Classes- senior retirement community/ some walking      Advanced Directives      Do you have a living will?      Do you have a DNR form?  If not, do you want to discuss one?      Do you have signed POA/HPOA for forms?       Functional Status Completed by: Daughter, Kathy Kindle      Do you have difficulty bathing or dressing yourself? no      Do you have difficulty preparing food or eating? no       Do you have difficulty managing your medications? no      Do you have difficulty managing your finances? no      Do you have difficulty affording your medications? no   Right handed   [facility]   Social Determinants of Health   Financial Resource Strain: Low Risk  (01/28/2018)   Overall Financial Resource Strain (CARDIA)    Difficulty of Paying Living Expenses: Not hard at all  Food Insecurity: No Food Insecurity (12/21/2021)   Hunger Vital Sign    Worried About Running Out of Food in the Last Year: Never true    Ran Out of Food in the Last Year: Never true  Transportation Needs: No Transportation Needs (12/21/2021)   PRAPARE - Hydrologist (Medical): No    Lack of Transportation (Non-Medical): No  Physical Activity: Sufficiently Active (01/28/2018)   Exercise Vital Sign    Days of Exercise per Week: 6 days    Minutes of Exercise per Session: 30 min  Stress: No Stress Concern Present (01/28/2018)   [place]    Feeling of Stress : Only a little  Social Connections: Moderately Isolated (01/28/2018)   Social Connection and Isolation Panel [NHANES]    Frequency of Communication with Friends and Family: More than three times a week    Frequency of Social Gatherings with Friends and Family: More than three times a week    Attends Religious Services: Never    Marine scientist or Organizations: No    Attends Archivist Meetings: Never    Marital Status: Widowed   Family History  Problem Relation Age of Onset   Lung disease Mother 84   Heart disease Father 51   Colon polyps Daughter    Alcohol abuse Son    Colon cancer Neg Hx    Stomach cancer Neg Hx    Rectal cancer Neg Hx    Crohn's disease Neg Hx    Esophageal cancer Neg Hx    Scheduled Meds:  sodium chloride   Intravenous Once   atorvastatin  20 mg Oral QHS   cholecalciferol  2,000 Units Oral Daily    digoxin  0.125 mg Oral Daily   feeding supplement  1 Container Oral TID BM   irbesartan  75 mg Oral QHS   leflunomide  20 mg Oral Daily   levothyroxine  100 mcg Oral Q0600   metoprolol succinate  50 mg Oral Daily   mirabegron ER  25 mg Oral Daily   mirtazapine  15 mg Oral QHS   multivitamin with minerals  1 tablet Oral Daily   ondansetron (ZOFRAN) IV  4 mg Intravenous Once   pantoprazole  80 mg Oral q morning   sodium chloride  1 g Oral Daily   Continuous Infusions:  cefTRIAXone (ROCEPHIN)  IV 1 g (03/07/22 1125)   PRN Meds:.cloNIDine, metoprolol tartrate Medications Prior to Admission:  Prior to Admission medications   Medication Sig Start Date End Date Taking? Authorizing Provider  acetaminophen (TYLENOL) 500 MG tablet Take 1,000 mg  by mouth 2 (two) times daily as needed for mild pain.   Yes [provider]  atorvastatin (LIPITOR) 20 MG tablet TAKE 1 TABLET DAILY Patient taking differently: Take 20 mg by mouth at bedtime. 06/14/21  Yes Virgie Dad, MD  Cholecalciferol (VITAMIN D) 50 MCG (2000 UT) CAPS Take 2,000 Units by mouth in the morning.   Yes [provider]  cloNIDine (CATAPRES) 0.1 MG tablet Take 1 tablet (0.1 mg total) by mouth daily as needed. If SBP >180 only Patient taking differently: Take 0.1 mg by mouth as needed (if SBP > 180). 02/08/22  Yes Wert, Margreta Journey, NP  digoxin (LANOXIN) 0.125 MG tablet TAKE 1 TABLET DAILY Patient taking differently: Take 0.125 mg by mouth daily. 06/14/21  Yes Virgie Dad, MD  Iron, Ferrous Sulfate, 325 (65 Fe) MG TABS Take 325 mg by mouth daily. 03/03/22  Yes Wert, Margreta Journey, NP  leflunomide (ARAVA) 20 MG tablet Take 20 mg by mouth in the morning. 10/05/19  Yes [provider]  levalbuterol (XOPENEX) 0.63 MG/3ML nebulizer solution Take 3 mLs (0.63 mg total) by nebulization in the morning and at bedtime. Patient taking differently: Take 0.63 mg by nebulization every 6 (six) hours as needed for wheezing (cough).  02/08/22  Yes Royal Hawthorn, NP  levothyroxine (SYNTHROID) 100 MCG tablet Take 1 tablet (100 mcg total) by mouth daily before breakfast. 01/04/22  Yes Ladell Pier, MD  metoprolol succinate (TOPROL-XL) 50 MG 24 hr tablet TAKE 1 TABLET DAILY Patient taking differently: Take 50 mg by mouth daily. 06/14/21  Yes Virgie Dad, MD  mirtazapine (REMERON) 15 MG tablet TAKE 1 TABLET AT BEDTIME Patient taking differently: Take 15 mg by mouth at bedtime. 02/20/21  Yes Virgie Dad, MD  Multiple Vitamin (MULTIVITAMIN) tablet Take 1 tablet by mouth in the morning.   Yes [provider]  Multiple Vitamins-Minerals (PRESERVISION AREDS 2) CAPS Take 1 capsule by mouth 2 (two) times daily.   Yes [provider]  NON FORMULARY Fluid restriction 1800cc daily  1st- 900 cc 2nd- 700 cc 3rd- 200 cc   Yes [provider]  omeprazole (PRILOSEC) 40 MG capsule TAKE 1 CAPSULE DAILY Patient taking differently: Take 40 mg by mouth daily. Take 30 minutes before breakfast on a empty stomach 10/17/21  Yes Virgie Dad, MD  ondansetron (ZOFRAN) 4 MG tablet Take 4 mg by mouth every 6 (six) hours as needed for nausea or vomiting.   Yes [provider]  PRESCRIPTION MEDICATION Take 1 g by mouth daily. Sodium 1 g   Yes [provider]  shark liver oil-cocoa butter (PREPARATION H) 0.25-3-85.5 % suppository Place 1 suppository rectally at bedtime. For 7 days starting 03/02/22   Yes [provider]  sodium chloride 1 g tablet Take 1 g by mouth in the morning.   Yes [provider]  valsartan (DIOVAN) 80 MG tablet Take 60 mg by mouth at bedtime. Increase valsartan to 60 mg by mouth daily and report if BP is not consistently <150/90 per [facility]   Yes [provider]  Vibegron (GEMTESA) 75 MG TABS Take 32.5 mg by mouth at bedtime. 11/10/21  Yes Wert, Margreta Journey, NP  vitamin B-12 (CYANOCOBALAMIN) 1000 MCG tablet Take 1,000 mcg by mouth in the morning.   Yes  [provider]  hydrocortisone (ANUSOL-HC) 25 MG suppository Place 1 suppository (25 mg total) rectally daily. Patient not taking: Reported on 03/06/2022 03/03/22   Royal Hawthorn, NP  valsartan (DIOVAN) 160  MG tablet Take 0.5 tablets (80 mg total) by mouth daily. Patient not taking: Reported on 03/06/2022 02/23/22   Royal Hawthorn, NP   Allergies  Allergen Reactions   Claritin [Loratadine] Other (See Comments)    Listed on MAR Unknown reaction   Codeine Other (See Comments)    "just don't take it well"   Gabapentin Other (See Comments)    Dizziness    Molds & Smuts Other (See Comments)    Dust.  Reaction is not listed on MAR    Pollen Extract Swelling    Grass   Bee Venom Swelling and Rash   Wasp Venom Swelling and Rash   Review of Systems Cast on leg Physical Exam Weak appearing lady Leg in cast  Vital Signs: BP (!) 155/99 (BP Location: Right Arm)   Pulse 92   Temp 98.6 F (37 C) (Oral)   Resp 15   Ht '5\' 2"'$  (1.575 m)   Wt 55.5 kg   SpO2 97%   BMI 22.39 kg/m  Pain Scale: Faces   Pain Score: 0-No pain   SpO2: SpO2: 97 % O2 Device:SpO2: 97 % O2 Flow Rate: .O2 Flow Rate (L/min): 2 L/min  IO: Intake/output summary:  Intake/Output Summary (Last 24 hours) at 03/07/2022 1533 Last data filed at 03/07/2022 1258 Gross per 24 hour  Intake 804 ml  Output 1525 ml  Net -721 ml    LBM: Last BM Date : 03/07/22 Baseline Weight: Weight: 55.5 kg Most recent weight: Weight: 55.5 kg     Palliative Assessment/Data:   PPS 50%  Time In: 1500 Time Out: 1600 Time Total: 60 Greater than 50%  of this time was spent counseling and coordinating care related to the above assessment and plan.  Signed by: Loistine Chance, MD   Please contact Palliative Medicine Team phone at [phone] for questions and concerns.  For individual provider: See Shea Evans

## 2022-03-07 NOTE — Progress Notes (Signed)
Patient ID: Kathy Velez, female   DOB: [birth year], 88 y.o.   MRN: [id number]    Progress Note   Subjective   Day # 2  CC;GI bleed  CT abdomen and pelvis with contrast-colonic diverticulosis no acute diverticulitis, no evidence of colonic mass under distention of the rectosigmoid stool throughout the colon, status post partial colectomy, multilevel degenerative disc disease  Labs this a.m. hemoglobin 7.5/hematocrit 22.8 down from 8.3 yesterday-being transfused BUN 18/creatinine 0.68 LFTs within normal limits  Patient is pleasant but confused today, hallucinating Per nursing  had 1 very dark bowel movement last night, none since Patient has no complaints, was able to eat breakfast.    Objective   Vital signs in last 24 hours: Temp:  [97.7 F (36.5 C)-98.6 F (37 C)] 97.9 F (36.6 C) (12/13 0800) Pulse Rate:  [57-121] 96 (12/13 0800) Resp:  [16-25] 17 (12/13 0800) BP: (126-192)/(57-100) 192/96 (12/13 0800) SpO2:  [92 %-100 %] 95 % (12/13 0800) Weight:  [55.5 kg] 55.5 kg (12/12 1445) Last BM Date : 03/07/22 General:    Very elderly white female female in NAD, pleasantly confused Heart:  Regular rate and rhythm; no murmurs Lungs: Respirations even and unlabored, lungs CTA bilaterally Abdomen:  Soft, nontender and nondistended. Normal bowel sounds. Extremities:  Without edema. Neurologic:  Alert and oriented x1  Psych:  Cooperative. Normal mood and affect.  Intake/Output from previous day: 12/12 0701 - 12/13 0700 In: 740 [P.O.:240; IV Piggyback:500] Out: 725 [Urine:725] Intake/Output this shift: No intake/output data recorded.  Lab Results: Recent Labs    03/06/22 1306 03/06/22 1930 03/07/22 0100  WBC 9.4  --  9.0  HGB 8.3* 8.2* 7.5*  HCT 25.4* 25.7* 22.8*  PLT 193  --  189   BMET Recent Labs    03/06/22 1306 03/07/22 0100  NA 136 133*  K 4.3 4.6  CL 102 103  CO2 27 22  GLUCOSE 119* 100*  BUN 23 18  CREATININE 0.78 0.68  CALCIUM 8.8* 8.7*   LFT Recent  Labs    03/07/22 0100  PROT 5.8*  ALBUMIN 3.1*  AST 22  ALT 16  ALKPHOS 66  BILITOT 0.7   PT/INR Recent Labs    03/06/22 1306  LABPROT 13.5  INR 1.0    Studies/Results: CT ABDOMEN PELVIS W CONTRAST  Result Date: 03/06/2022 CLINICAL DATA:  Lower GI bleed Colon cancer, staging. "[facility] B[facility] bleeding. Pt has a hx on colon CA, vascular dementia " EXAM: CT ABDOMEN AND PELVIS WITH CONTRAST TECHNIQUE: Multidetector CT imaging of the abdomen and pelvis was performed using the standard protocol following bolus administration of intravenous contrast. RADIATION DOSE REDUCTION: This exam was performed according to the departmental dose-optimization program which includes automated exposure control, adjustment of the mA and/or kV according to patient size and/or use of iterative reconstruction technique. CONTRAST:  144m OMNIPAQUE IOHEXOL 300 MG/ML  SOLN COMPARISON:  CT chest abdomen pelvis 03/31/2021, CT abdomen pelvis 10/27/2018 FINDINGS: Lower chest: Mild reticulations. Status post bicuspid and tricuspid valve replacements. No acute abnormality. Hepatobiliary: No focal liver abnormality. No gallstones, gallbladder wall thickening, or pericholecystic fluid. No biliary dilatation. Pancreas: No focal lesion. Normal pancreatic contour. No surrounding inflammatory changes. No main pancreatic ductal dilatation. Spleen: Normal in size without focal abnormality. Adrenals/Urinary Tract: No adrenal nodule bilaterally. Bilateral kidneys enhance symmetrically. Fluid density lesion within the right kidney likely represents a simple renal cyst. Simple renal cysts, in the absence of clinically indicated signs/symptoms,  require no independent follow-up. No hydronephrosis. No hydroureter. The urinary bladder is unremarkable. On delayed imaging, there is no urothelial wall thickening and there are no filling defects in the opacified portions of the bilateral collecting  systems or ureters. Stomach/Bowel: Partial colectomy. Stomach is within normal limits. No evidence of bowel wall thickening or dilatation. Under distension of the rectosigmoid colon. Colonic diverticulosis. Stool throughout the colon. Vascular/Lymphatic: No abdominal aorta or iliac aneurysm. Severe atherosclerotic plaque of the aorta and its branches. No abdominal, pelvic, or inguinal lymphadenopathy. Reproductive: Status post hysterectomy. No adnexal masses. Other: No intraperitoneal free fluid. No intraperitoneal free gas. No organized fluid collection. Musculoskeletal: No abdominal wall hernia or abnormality. No suspicious lytic or blastic osseous lesions. No acute displaced fracture. Multilevel degenerative changes of the spine with grade 1 anterolisthesis of L3 on L4 and grade 2 anterolisthesis of L4 on L5. Mild retrolisthesis of T10 on T11 and T11 on T12. Multilevel intervertebral disc space narrowing. L3-L5 posterolateral fusion surgical hardware. IMPRESSION: 1. Colonic diverticulosis with no acute diverticulitis. 2. Partial colectomy. 3.  Aortic Atherosclerosis (ICD10-I70.0). Electronically Signed   By: Iven Finn M.D.   On: 03/06/2022 17:37       Assessment / Plan:    #75 88 year old white female with remote diagnosis of colon adenocarcinoma 2006 status post segmental resection of the transverse colon.  Underwent evaluation November 2022 for iron deficiency anemia and heme positive stool and was found to have a fungating partially obstructing mass at the splenic flexure biopsies positive for well-differentiated invasive adenocarcinoma and also had a 25 mm polyp removed from the transverse colon which was a sessile serrated polyp.  Family opted for nonsurgical management, she was treated with 4 cycles of immunotherapy per Dr. Benay Spice completed June 2023, and halted at that time due to poor tolerance Also underwent follow-up colonoscopy June 2023 with no evidence of residual tumor and no  recurrent polyps, does have significant left-sided diverticulosis, and small internal hemorrhoids  Patient has had persistent anemia, acute onset of lower GI bleeding on 03/02/2022 [place] and clots 1.5 g drop in hemoglobin over 4 days.  CT abdomen pelvis done last evening does not show any evidence of recurrent tumor, though note that her original CT scan did not clearly show the splenic flexure tumor either.  Suspect this is actually diverticular bleeding but cannot rule out recurrent malignancy.  She has been hemodynamically stable, hemoglobin has drifted but she has not yet required transfusion  #2 fatigue and poor appetite over the past few weeks secondary to above #3 new confusion over the past week-disoriented and hallucinating this morning though very pleasant  #4 history of CVA/TIA 5.  History of A-fib not on anticoagulation due to high fall risk  Plan-full liquid diet with supplements today Continue to trend hemoglobin suspect she will need at least 1 unit of packed RBCs. Check CEA  Patient's daughter/family continuing to hope for conservative management We discussed potential management should she have more major active bleeding, and/or persistent bleeding which does not resolve over the next day or 2.       Principal Problem:   GIB (gastrointestinal bleeding) Active Problems:   Hypothyroidism   Rheumatoid arthritis ([place])   Essential hypertension   Mixed hyperlipidemia   Permanent atrial fibrillation (HCC)   Colon cancer ([place])   GERD without esophagitis     LOS: 0 days   Imonie Tuch PA-C 03/07/2022, 8:29 AM

## 2022-03-07 NOTE — Progress Notes (Signed)
Attempted to call both daughter and son in law to obtain telephonic consent for blood transfusion of 1 unit. Voicemail left with both. Awaiting response

## 2022-03-07 NOTE — TOC Progression Note (Addendum)
Transition of Care [facility]) - Progression Note    Patient Details  Name: Kathy Velez MRN: [id number] Date of Birth: 1931-05-11  Transition of Care [facility]) CM/SW Contact  Beckie Busing, RN Phone [phone]  03/07/2022, 4:09 PM  Clinical Narrative:     TOC acknowledges patient from [facility]. Will follow for disposition needs.        Expected Discharge Plan and Services                                                 Social Determinants of Health (SDOH) Interventions    Readmission Risk Interventions     No data to display

## 2022-03-08 ENCOUNTER — Inpatient Hospital Stay: Payer: Medicare Other

## 2022-03-08 ENCOUNTER — Inpatient Hospital Stay: Payer: Medicare Other | Admitting: Oncology

## 2022-03-08 ENCOUNTER — Encounter (HOSPITAL_COMMUNITY): Payer: Self-pay | Admitting: Internal Medicine

## 2022-03-08 DIAGNOSIS — K5731 Diverticulosis of large intestine without perforation or abscess with bleeding: Principal | ICD-10-CM

## 2022-03-08 DIAGNOSIS — Z85038 Personal history of other malignant neoplasm of large intestine: Secondary | ICD-10-CM

## 2022-03-08 DIAGNOSIS — K625 Hemorrhage of anus and rectum: Principal | ICD-10-CM

## 2022-03-08 DIAGNOSIS — K5790 Diverticulosis of intestine, part unspecified, without perforation or abscess without bleeding: Secondary | ICD-10-CM

## 2022-03-08 DIAGNOSIS — D62 Acute posthemorrhagic anemia: Secondary | ICD-10-CM

## 2022-03-08 DIAGNOSIS — K922 Gastrointestinal hemorrhage, unspecified: Secondary | ICD-10-CM | POA: Diagnosis not present

## 2022-03-08 LAB — TYPE AND SCREEN
ABO/RH(D): O NEG
Antibody Screen: NEGATIVE
Unit division: 0

## 2022-03-08 LAB — BPAM RBC
Blood Product Expiration Date: 202401182359
ISSUE DATE / TIME: 202312130322
Unit Type and Rh: 9500

## 2022-03-08 LAB — CBC
HCT: 28.9 % — ABNORMAL LOW (ref 36.0–46.0)
Hemoglobin: 9.6 g/dL — ABNORMAL LOW (ref 12.0–15.0)
MCH: 33.3 pg (ref 26.0–34.0)
MCHC: 33.2 g/dL (ref 30.0–36.0)
MCV: 100.3 fL — ABNORMAL HIGH (ref 80.0–100.0)
Platelets: 177 10*3/uL (ref 150–400)
RBC: 2.88 MIL/uL — ABNORMAL LOW (ref 3.87–5.11)
RDW: 15.5 % (ref 11.5–15.5)
WBC: 12.6 10*3/uL — ABNORMAL HIGH (ref 4.0–10.5)
nRBC: 0 % (ref 0.0–0.2)

## 2022-03-08 LAB — HEMOGLOBIN AND HEMATOCRIT, BLOOD
HCT: 26.2 % — ABNORMAL LOW (ref 36.0–46.0)
HCT: 35.9 % — ABNORMAL LOW (ref 36.0–46.0)
Hemoglobin: 9 g/dL — ABNORMAL LOW (ref 12.0–15.0)
Hemoglobin: 12.1 g/dL (ref 12.0–15.0)

## 2022-03-08 LAB — BASIC METABOLIC PANEL
Anion gap: 11 (ref 5–15)
BUN: 12 mg/dL (ref 8–23)
CO2: 24 mmol/L (ref 22–32)
Calcium: 8.6 mg/dL — ABNORMAL LOW (ref 8.9–10.3)
Chloride: 92 mmol/L — ABNORMAL LOW (ref 98–111)
Creatinine, Ser: 0.52 mg/dL (ref 0.44–1.00)
GFR, Estimated: >60 mL/min (ref >60)
Glucose, Bld: 138 mg/dL — ABNORMAL HIGH (ref 70–99)
Potassium: 3.9 mmol/L (ref 3.5–5.1)
Sodium: 127 mmol/L — ABNORMAL LOW (ref 135–145)

## 2022-03-08 MED ORDER — IRBESARTAN 150 MG PO TABS
150.0000 mg | ORAL_TABLET | Freq: Every day | ORAL | Status: DC
  Administered 2022-03-08 – 2022-03-09 (×2): 150 mg via ORAL
  Filled 2022-03-08 (×2): qty 1

## 2022-03-08 MED ORDER — SODIUM CHLORIDE 0.9 % IV SOLN
250.0000 mg | Freq: Once | INTRAVENOUS | Status: AC
  Administered 2022-03-08: 250 mg via INTRAVENOUS
  Filled 2022-03-08: qty 20

## 2022-03-08 MED ORDER — IRBESARTAN 75 MG PO TABS
75.0000 mg | ORAL_TABLET | Freq: Once | ORAL | Status: AC
  Administered 2022-03-08: 75 mg via ORAL
  Filled 2022-03-08: qty 1

## 2022-03-08 NOTE — Progress Notes (Addendum)
Progress Note  Primary GI: Dr. Fuller Plan   Subjective  Chief Complaint: GI bleed  Daughter at bedside, patient sleeping in bed when I entered the room. Per nurse had some nausea last night treated with Zofran and this improved. No bowel movements overnight, last bowel movement yesterday morning loose stools no evidence of bleeding.  Per daughter baseline since colon resection has been loose stools.   Per daughter has had hallucinating very confused yesterday pending urine culture, lethargic today.    Objective   Vital signs in last 24 hours: Temp:  [97.6 F (36.4 C)-98.6 F (37 C)] 97.6 F (36.4 C) (12/14 0528) Pulse Rate:  [84-97] 84 (12/14 0528) Resp:  [15-18] 18 (12/14 0528) BP: (155-186)/(99-107) 180/100 (12/14 0528) SpO2:  [91 %-97 %] 95 % (12/14 0528) Last BM Date : 03/07/22 Last BM recorded by nurses in past 5 days Stool Type: Type 7 (Liquid consistency with no solid pieces) (03/07/2022  5:40 AM)  General:   female lethargic, pleasantly confused, will wake up but will fall back asleep. Heart:  Regular rate and rhythm; no murmurs Pulm: Clear anteriorly; no wheezing Abdomen:  Soft, Obese AB, Active bowel sounds. No tenderness . , No organomegaly appreciated. Extremities:    edema. Neurologic:  A&O x 1;   Psych: Slightly  Intake/Output from previous day: 12/13 0701 - 12/14 0700 In: 1370 [P.O.:806; I.V.:60; Blood:504] Out: 1400 [Urine:1400] Intake/Output this shift: No intake/output data recorded.  Studies/Results: CT ABDOMEN PELVIS W CONTRAST  Result Date: 03/06/2022 CLINICAL DATA:  Lower GI bleed Colon cancer, staging. "Pt BIB [facility] from [facility] for rectal bleeding. Pt has a hx on colon CA, vascular dementia " EXAM: CT ABDOMEN AND PELVIS WITH CONTRAST TECHNIQUE: Multidetector CT imaging of the abdomen and pelvis was performed using the standard protocol following bolus administration of intravenous contrast. RADIATION DOSE REDUCTION: This  exam was performed according to the departmental dose-optimization program which includes automated exposure control, adjustment of the mA and/or kV according to patient size and/or use of iterative reconstruction technique. CONTRAST:  157m OMNIPAQUE IOHEXOL 300 MG/ML  SOLN COMPARISON:  CT chest abdomen pelvis 03/31/2021, CT abdomen pelvis 10/27/2018 FINDINGS: Lower chest: Mild reticulations. Status post bicuspid and tricuspid valve replacements. No acute abnormality. Hepatobiliary: No focal liver abnormality. No gallstones, gallbladder wall thickening, or pericholecystic fluid. No biliary dilatation. Pancreas: No focal lesion. Normal pancreatic contour. No surrounding inflammatory changes. No main pancreatic ductal dilatation. Spleen: Normal in size without focal abnormality. Adrenals/Urinary Tract: No adrenal nodule bilaterally. Bilateral kidneys enhance symmetrically. Fluid density lesion within the right kidney likely represents a simple renal cyst. Simple renal cysts, in the absence of clinically indicated signs/symptoms, require no independent follow-up. No hydronephrosis. No hydroureter. The urinary bladder is unremarkable. On delayed imaging, there is no urothelial wall thickening and there are no filling defects in the opacified portions of the bilateral collecting systems or ureters. Stomach/Bowel: Partial colectomy. Stomach is within normal limits. No evidence of bowel wall thickening or dilatation. Under distension of the rectosigmoid colon. Colonic diverticulosis. Stool throughout the colon. Vascular/Lymphatic: No abdominal aorta or iliac aneurysm. Severe atherosclerotic plaque of the aorta and its branches. No abdominal, pelvic, or inguinal lymphadenopathy. Reproductive: Status post hysterectomy. No adnexal masses. Other: No intraperitoneal free fluid. No intraperitoneal free gas. No organized fluid collection. Musculoskeletal: No abdominal wall hernia or abnormality. No suspicious lytic or blastic  osseous lesions. No acute displaced fracture. Multilevel degenerative changes of the spine with grade 1 anterolisthesis of L3  on L4 and grade 2 anterolisthesis of L4 on L5. Mild retrolisthesis of T10 on T11 and T11 on T12. Multilevel intervertebral disc space narrowing. L3-L5 posterolateral fusion surgical hardware. IMPRESSION: 1. Colonic diverticulosis with no acute diverticulitis. 2. Partial colectomy. 3.  Aortic Atherosclerosis (ICD10-I70.0). Electronically Signed   By: Iven Finn M.D.   On: 03/06/2022 17:37    Lab Results: Recent Labs    03/06/22 1306 03/06/22 1930 03/07/22 0100 03/07/22 1018 03/07/22 1708 03/08/22 0028  WBC 9.4  --  9.0  --   --   --   HGB 8.3*   < > 7.5* 8.9* 8.8* 9.0*  HCT 25.4*   < > 22.8* 26.9* 26.3* 26.2*  PLT 193  --  189  --   --   --    < > = values in this interval not displayed.   BMET Recent Labs    03/06/22 1306 03/07/22 0100  NA 136 133*  K 4.3 4.6  CL 102 103  CO2 27 22  GLUCOSE 119* 100*  BUN 23 18  CREATININE 0.78 0.68  CALCIUM 8.8* 8.7*   LFT Recent Labs    03/07/22 0100  PROT 5.8*  ALBUMIN 3.1*  AST 22  ALT 16  ALKPHOS 66  BILITOT 0.7   PT/INR Recent Labs    03/06/22 1306  LABPROT 13.5  INR 1.0     Scheduled Meds:  sodium chloride   Intravenous Once   atorvastatin  20 mg Oral QHS   cholecalciferol  2,000 Units Oral Daily   digoxin  0.125 mg Oral Daily   feeding supplement  1 Container Oral TID BM   irbesartan  150 mg Oral QHS   leflunomide  20 mg Oral Daily   levothyroxine  100 mcg Oral Q0600   melatonin  3 mg Oral QHS   metoprolol succinate  50 mg Oral Daily   mirabegron ER  25 mg Oral Daily   mirtazapine  15 mg Oral QHS   multivitamin with minerals  1 tablet Oral Daily   ondansetron (ZOFRAN) IV  4 mg Intravenous Once   pantoprazole  80 mg Oral q morning   sodium chloride  1 g Oral Daily   Continuous Infusions:  cefTRIAXone (ROCEPHIN)  IV 1 g (03/08/22 1018)      Patient profile:   88 year old  female with remote history of colon cancer with segmental resection 2006.  EGD: 02/22/2021 FOBT positive, IDA with EGD showing distal esophageal stricture, small hiatal hernia, colonoscopy 25 mm polyp transverse colon removed piecemeal and fungating partially obstructing mass splenic flexure 2 cm in length showed invasive adenocarcinoma and well-differentiated polyp with sessile serrated polyp.  Immunotherapy started March 2023 however due to decreasing quality of life this was discontinued July 2023. June 2023 colonoscopy Dr. Fuller Plan no further polyps or evidence of disease, diverticula, internal hemorrhoids.   Impression/Plan:   GI bleeding with acute on chronic anemia in patient with history of colon cancer 2006, segmental resection and recurrent 01/2021  Therapy discontinued July 2023 due to side effects June 2023 colonoscopy no evidence of disease activity. CTA negative for active bleeding, no evidence for colonic mass Hemoglobin stable 8.8 currently at 9 overnight with no evidence of rebleeding Most likely this represents diverticular bleed, and with patient failing previous immunotherapy, patient's daughter is not interested and evaluating for colon cancer at this time. If patient continues to have active GI bleeding would consider doing colonoscopy at that time for therapeutic measures. Patient on full liquid  diet, doing well, we will switch to soft diet Continue to monitor hemoglobin hematocrit Appears patient has baseline diarrhea since colon resection 2006, will add on fiber outpatient Pending CEA At this time [facility] will sign off, please call us back if patient has any active rebleeding, consider CTA at that time as well.  Nausea and vomiting Nonbloody vomiting twice last night Zofran helped. Abnormal urine, pending urinary culture, showed gram-negative rods susceptibilities to follow Possibly related to UTI, antibiotics, continue Zofran as needed. Continue PPI  daily.  Confusion/hallucinations Has some baseline confusion per daughter, more aggressive hallucinations, worse yesterday.  Patient lethargic this morning but appears to be at baseline.?  Hospital delirium Pending urinary culture, showed gram-negative rods susceptibilities to follow  Principal Problem:   GIB (gastrointestinal bleeding) Active Problems:   Hypothyroidism   Rheumatoid arthritis ([place])   Essential hypertension   Mixed hyperlipidemia   Permanent atrial fibrillation (HCC)   Colon cancer (HCC)   GERD without esophagitis   Rectal bleeding   Diverticulosis   Acute blood loss anemia   History of colon cancer    LOS: 1 day   Vladimir Crofts  03/08/2022, 10:29 AM       Attending 62 Attestation   I have taken an interval history, reviewed the chart and examined the patient.   Overall clinical stability remains.  No evidence of significant GI bleeding recurring at this time.  Plan for supportive measures and no repeat colonoscopy unless patient has recurrent bleeding and patient/family agree with this.  The inpatient GI service will sign off at this time but if issues arise please contact us and we will be happy to reevaluate the patient.   I agree with the Advanced Practitioner's note, impression, and recommendations with updates and my documentation as noted above.  The majority of the medical decision making/process, formulation of the impression/plan of action for the patient were performed by me with substantive portion of this encounter (>50% time spent including complete performance of at least one of the key components of MDM, History, and/or Exam).   Justice Britain, MD [facility] Office # [phone]

## 2022-03-08 NOTE — Progress Notes (Signed)
PROGRESS NOTE Kathy Velez  QIO:[id number] DOB: 09-Jan-1932 DOA: 03/06/2022 PCP: Virgie Dad, MD   Brief Narrative/Hospital Course:  88 y.o. female with medical history significant of colon CA in 2006 status post segmental resection of transverse colon, and had evaluation for anemia on Nov/2022 and found to have a fungating partially obstructing mass at the splenic flexure positive for invasive adenocarcinoma family opted for nonsurgical management-treated with immunotherapy on splenic flexure colon cancer unable to tolerate continued immunotherapy this summer, she was on Eliquis but was stopped within the last few months w/ hx of hypothyroidism, HTN, A fib, GERD, HLD, RA, chronic hyponatremia. Presenting with rectal bleeding. She reports that she noticed BRBPR and clots starting 4 days ago.  Patient seen in the ED, GI was consulted and admitted.  Family had been reluctant for colonoscopy evaluation so CT scan was done to rule out recurrent malignancy-it showed partial colectomy, colonic diverticulosis with no acute diverticulitis. Patient has been having confusion over the past week intermittently.  She has had a fall with a scalp hematoma in September 2023.    Subjective: Seen examined Overnight blood pressure has been in 180s to 190s Labs with stable hemoglobin improved to 9.0. Has been confused but today much improved daughter at the bedside Has had a bowel movement but no bleeding Has a cast on her right foot x 1 month.  Assessment and Plan: Principal Problem:   GIB (gastrointestinal bleeding) Active Problems:   Essential hypertension   Rheumatoid arthritis (HCC)   Hypothyroidism   Mixed hyperlipidemia   GERD without esophagitis   Permanent atrial fibrillation (HCC)   Colon cancer (HCC)   Rectal bleeding   Diverticulosis   Acute blood loss anemia   History of colon cancer  Rectal bleeding/GI bleeding: CT abdomen with no recurrence of cancer.  Sp 1 unit.  Cont transfusion  support for hemoglobin less than 7 g.  GI following closely.  At this time no recurrence of bleeding, tolerating diet> GI advancing her diet today.  Acute blood loss anemia due to rectal bleeding: s/p 1u PRBC 12/13.improved Recent Labs  Lab 03/06/22 1930 03/07/22 0100 03/07/22 1018 03/07/22 1708 03/08/22 0028  HGB 8.2* 7.5* 8.9* 8.8* 9.0*  HCT 25.7* 22.8* 26.9* 26.3* 26.2*     History of colon cancer:colon CA in 2006 status post segmental resection of transverse colon, and had evaluation for anemia on Nov/2022 and found to have a fungating partially obstructing mass at the splenic flexure positive for invasive adenocarcinoma family opted for nonsurgical management-treated with immunotherapy on splenic flexure colon cancer unable to tolerate continued immunotherapy this summer.  CT abdomen on admission does not show recurrence of cancer.  Intermittent confusion past weeks: Previous similar episodes with UTI, UA does appear grossly abnormal urine culture sent and discharged on antibiotics 12/3.Mentation much improved.  Continue supportive care PT OT.   Klebsiella UTI continue ceftriaxone, follow-up culture growing Klebsiella pneumoniae C/S pending  Hypothyroidism: Stable, continue home regimen when confirmed  HTN: bp poorly controlled, increase Avapro to 150 mg, continue metoprolol continue home prn clonidine.   Hx of A-fib : stable, continue metoprolol, does not appear to be anticoagulated due to high risk GERD continue PPI HLD continue statin. RA: Continue home leflunomide  Recent right ankle fracture currently cast in place for 1 month, she has appointment for follow-up removal cast and further care on December 28 currently on nonweightbearing status  DVT prophylaxis: SCDs Start: 03/06/22 1733 Code Status:   Code Status: DNR Family Communication: plan  of care discussed with patient/daughter at bedside. Patient status is: Inpatient because of bleeding and UTI Level of care: Telemetry    Dispo: The patient is from: snf            Anticipated disposition: snf tomorrow if tolerating diet and no recurrence of bleeding  Objective: Vitals last 24 hrs: Vitals:   03/07/22 1508 03/07/22 2050 03/07/22 2055 03/08/22 0528  BP: (!) 155/99 (!) 186/107 (!) 177/103 (!) 180/100  Pulse: 92 96 97 84  Resp: '15 18  18  '$ Temp: 98.6 F (37 C) 97.8 F (36.6 C)  97.6 F (36.4 C)  TempSrc: Oral Oral  Oral  SpO2: 97% 91% 91% 95%  Weight:      Height:       Weight change:   Physical Examination: General exam: Aaox2-3, weak,older appearing HEENT:Oral mucosa moist, Ear/Nose WNL grossly, dentition normal. Respiratory system: bilaterally clear BS, no use of accessory muscle Cardiovascular system: S1 & S2 +, regular rate, JVD neg. Gastrointestinal system: Abdomen soft, NT,ND,BS+ Nervous System:Alert, awake, moving extremities and grossly nonfocal Extremities: LE ankle edema rt fott in cast Skin: No rashes,no icterus. MSK: Normal muscle bulk,tone, power    Medications reviewed:  Scheduled Meds:  sodium chloride   Intravenous Once   atorvastatin  20 mg Oral QHS   cholecalciferol  2,000 Units Oral Daily   digoxin  0.125 mg Oral Daily   feeding supplement  1 Container Oral TID BM   irbesartan  150 mg Oral QHS   irbesartan  75 mg Oral Once   leflunomide  20 mg Oral Daily   levothyroxine  100 mcg Oral Q0600   melatonin  3 mg Oral QHS   metoprolol succinate  50 mg Oral Daily   mirabegron ER  25 mg Oral Daily   mirtazapine  15 mg Oral QHS   multivitamin with minerals  1 tablet Oral Daily   ondansetron (ZOFRAN) IV  4 mg Intravenous Once   pantoprazole  80 mg Oral q morning   sodium chloride  1 g Oral Daily   Continuous Infusions:  cefTRIAXone (ROCEPHIN)  IV 1 g (03/07/22 1125)      Diet Order             Diet full liquid Room service appropriate? Yes; Fluid consistency: Thin  Diet effective now                  Intake/Output Summary (Last 24 hours) at 03/08/2022  0927 Last data filed at 03/08/2022 0500 Gross per 24 hour  Intake 806 ml  Output 1400 ml  Net -594 ml    Net IO Since Admission: -15 mL [03/08/22 0927]  Wt Readings from Last 3 Encounters:  03/06/22 55.5 kg  03/02/22 55.5 kg  02/23/22 55.8 kg     Unresulted Labs (From admission, onward)     Start     Ordered   03/08/22 0630  Basic metabolic panel  Daily,   R     Question:  Specimen collection method  Answer:  Lab=Lab collect   03/07/22 1324   03/08/22 0500  CBC  Daily,   R     Question:  Specimen collection method  Answer:  Lab=Lab collect   03/07/22 1324   03/07/22 1103  CEA  Once,   R       Question:  Specimen collection method  Answer:  Lab=Lab collect   03/07/22 1102   03/07/22 0948  Urine Culture  (Urine Culture)  Once,  R       Question:  Indication  Answer:  Altered mental status (if no other cause identified)   03/07/22 0947   03/06/22 1632  Hemoglobin and hematocrit, blood  Now then every 8 hours,   R (with TIMED occurrences)     Comments: Call MD for hgb 7.5 or less for transfusion orders    03/06/22 1632          Data Reviewed: I have personally reviewed following labs and imaging studies CBC: Recent Labs  Lab 03/02/22 0000 03/06/22 1306 03/06/22 1930 03/07/22 0100 03/07/22 1018 03/07/22 1708 03/08/22 0028  WBC 5.3 9.4  --  9.0  --   --   --   NEUTROABS  --  6.9  --   --   --   --   --   HGB 9.9* 8.3* 8.2* 7.5* 8.9* 8.8* 9.0*  HCT 28* 25.4* 25.7* 22.8* 26.9* 26.3* 26.2*  MCV  --  104.1*  --  103.6*  --   --   --   PLT 169 193  --  189  --   --   --     Basic Metabolic Panel: Recent Labs  Lab 03/06/22 1306 03/07/22 0100  NA 136 133*  K 4.3 4.6  CL 102 103  CO2 27 22  GLUCOSE 119* 100*  BUN 23 18  CREATININE 0.78 0.68  CALCIUM 8.8* 8.7*   No results found for this or any previous visit (from the past 240 hour(s)).  Antimicrobials: Anti-infectives (From admission, onward)    Start     Dose/Rate Route Frequency Ordered Stop    03/07/22 1000  cefTRIAXone (ROCEPHIN) 1 g in sodium chloride 0.9 % 100 mL IVPB        1 g 200 mL/hr over 30 Minutes Intravenous Every 24 hours 03/07/22 0947        Culture/Microbiology    Component Value Date/Time   SDES  10/27/2021 1320    URINE, CLEAN CATCH Performed at [facility], [address], [place], [place] [address]    [facility]  10/27/2021 1320    NONE Performed at [facility], [address] [place], [place] [address]    CULT >=100,000 COLONIES/mL ESCHERICHIA COLI (A) 10/27/2021 1320   REPTSTATUS 10/29/2021 FINAL 10/27/2021 1320  Radiology Studies: CT ABDOMEN PELVIS W CONTRAST  Result Date: 03/06/2022 CLINICAL DATA:  Lower GI bleed Colon cancer, staging. "Pt BIB [facility] from [facility] for rectal bleeding. Pt has a hx on colon CA, vascular dementia " EXAM: CT ABDOMEN AND PELVIS WITH CONTRAST TECHNIQUE: Multidetector CT imaging of the abdomen and pelvis was performed using the standard protocol following bolus administration of intravenous contrast. RADIATION DOSE REDUCTION: This exam was performed according to the departmental dose-optimization program which includes automated exposure control, adjustment of the mA and/or kV according to patient size and/or use of iterative reconstruction technique. CONTRAST:  111m OMNIPAQUE IOHEXOL 300 MG/ML  SOLN COMPARISON:  CT chest abdomen pelvis 03/31/2021, CT abdomen pelvis 10/27/2018 FINDINGS: Lower chest: Mild reticulations. Status post bicuspid and tricuspid valve replacements. No acute abnormality. Hepatobiliary: No focal liver abnormality. No gallstones, gallbladder wall thickening, or pericholecystic fluid. No biliary dilatation. Pancreas: No focal lesion. Normal pancreatic contour. No surrounding inflammatory changes. No main pancreatic ductal dilatation. Spleen: Normal in size without focal abnormality. Adrenals/Urinary Tract: No adrenal nodule bilaterally. Bilateral  kidneys enhance symmetrically. Fluid density lesion within the right kidney likely represents a simple renal cyst. Simple renal cysts,  in the absence of clinically indicated signs/symptoms, require no independent follow-up. No hydronephrosis. No hydroureter. The urinary bladder is unremarkable. On delayed imaging, there is no urothelial wall thickening and there are no filling defects in the opacified portions of the bilateral collecting systems or ureters. Stomach/Bowel: Partial colectomy. Stomach is within normal limits. No evidence of bowel wall thickening or dilatation. Under distension of the rectosigmoid colon. Colonic diverticulosis. Stool throughout the colon. Vascular/Lymphatic: No abdominal aorta or iliac aneurysm. Severe atherosclerotic plaque of the aorta and its branches. No abdominal, pelvic, or inguinal lymphadenopathy. Reproductive: Status post hysterectomy. No adnexal masses. Other: No intraperitoneal free fluid. No intraperitoneal free gas. No organized fluid collection. Musculoskeletal: No abdominal wall hernia or abnormality. No suspicious lytic or blastic osseous lesions. No acute displaced fracture. Multilevel degenerative changes of the spine with grade 1 anterolisthesis of L3 on L4 and grade 2 anterolisthesis of L4 on L5. Mild retrolisthesis of T10 on T11 and T11 on T12. Multilevel intervertebral disc space narrowing. L3-L5 posterolateral fusion surgical hardware. IMPRESSION: 1. Colonic diverticulosis with no acute diverticulitis. 2. Partial colectomy. 3.  Aortic Atherosclerosis (ICD10-I70.0). Electronically Signed   By: Iven Finn M.D.   On: 03/06/2022 17:37     LOS: 1 day   Antonieta Pert, MD [facility]  03/08/2022, 9:27 AM

## 2022-03-08 NOTE — Progress Notes (Signed)
Pt vomited 2 times around 2100-2200. Obtained order for zofran IV prn and this was effective for pt. Melatonin was also given as ordered and pt has been asleep since around 0000. Remains confused, picking at the air, A&O to self only. Continue to monitor. Hortencia Conradi RN

## 2022-03-09 DIAGNOSIS — Z515 Encounter for palliative care: Secondary | ICD-10-CM

## 2022-03-09 DIAGNOSIS — K922 Gastrointestinal hemorrhage, unspecified: Secondary | ICD-10-CM | POA: Diagnosis not present

## 2022-03-09 DIAGNOSIS — Z7189 Other specified counseling: Secondary | ICD-10-CM

## 2022-03-09 LAB — CBC
HCT: 28.6 % — ABNORMAL LOW (ref 36.0–46.0)
Hemoglobin: 9.6 g/dL — ABNORMAL LOW (ref 12.0–15.0)
MCH: 33.6 pg (ref 26.0–34.0)
MCHC: 33.6 g/dL (ref 30.0–36.0)
MCV: 100 fL (ref 80.0–100.0)
Platelets: 212 10*3/uL (ref 150–400)
RBC: 2.86 MIL/uL — ABNORMAL LOW (ref 3.87–5.11)
RDW: 15.7 % — ABNORMAL HIGH (ref 11.5–15.5)
WBC: 11.4 10*3/uL — ABNORMAL HIGH (ref 4.0–10.5)
nRBC: 0 % (ref 0.0–0.2)

## 2022-03-09 LAB — URINE CULTURE: Culture: >=100000 — AB

## 2022-03-09 LAB — BASIC METABOLIC PANEL
Anion gap: 8 (ref 5–15)
BUN: 24 mg/dL — ABNORMAL HIGH (ref 8–23)
CO2: 26 mmol/L (ref 22–32)
Calcium: 8.5 mg/dL — ABNORMAL LOW (ref 8.9–10.3)
Chloride: 91 mmol/L — ABNORMAL LOW (ref 98–111)
Creatinine, Ser: 0.83 mg/dL (ref 0.44–1.00)
GFR, Estimated: >60 mL/min (ref >60)
Glucose, Bld: 99 mg/dL (ref 70–99)
Potassium: 4.2 mmol/L (ref 3.5–5.1)
Sodium: 125 mmol/L — ABNORMAL LOW (ref 135–145)

## 2022-03-09 LAB — CEA: CEA: 8.2 ng/mL — ABNORMAL HIGH (ref 0.0–4.7)

## 2022-03-09 LAB — HEMOGLOBIN AND HEMATOCRIT, BLOOD
HCT: 29.9 % — ABNORMAL LOW (ref 36.0–46.0)
Hemoglobin: 10.2 g/dL — ABNORMAL LOW (ref 12.0–15.0)

## 2022-03-09 MED ORDER — SODIUM CHLORIDE 1 G PO TABS
1.0000 g | ORAL_TABLET | Freq: Three times a day (TID) | ORAL | Status: DC
  Administered 2022-03-09 – 2022-03-10 (×5): 1 g via ORAL
  Filled 2022-03-09 (×5): qty 1

## 2022-03-09 MED ORDER — FERROUS SULFATE 325 (65 FE) MG PO TABS
325.0000 mg | ORAL_TABLET | Freq: Every day | ORAL | Status: DC
  Administered 2022-03-10: 325 mg via ORAL
  Filled 2022-03-09: qty 1

## 2022-03-09 MED ORDER — CEFADROXIL 500 MG PO CAPS
500.0000 mg | ORAL_CAPSULE | Freq: Two times a day (BID) | ORAL | Status: DC
  Administered 2022-03-09 – 2022-03-10 (×3): 500 mg via ORAL
  Filled 2022-03-09 (×3): qty 1

## 2022-03-09 NOTE — Progress Notes (Signed)
PMT no charge note.   Briefly discussed with TR[facility] MD. Chart reviewed, PT recommends SNF rehab attempt and then return to [facility] with home health PT/OT. Recommend addition of palliative services in the outpatient setting, continue current mode of care. No new PMT specific recommendations at this time.  Loistine Chance MD [facility].

## 2022-03-09 NOTE — Evaluation (Addendum)
Physical Therapy Evaluation Patient Details Name: Kathy Velez MRN: [id number] DOB: [birth year] Today's Date: 03/09/2022  History of Present Illness  Pt is an 88yo female presenting to [facility] ED on 03/06/22 from [place] with complaints of rectal bleeding, CT scan negative for acute findings, received PRBC. PMH: afib, hx of colon cancer, GERD, HLD, HTN, hypothyroidism, RA, seizures, hx of TIA, vascular dementia   Clinical Impression  Pt admitted with above diagnosis, pleasant but confused, Aox1. BP prior to mobility 126/94, HR85. Pt currently with functional limitations due to the deficits listed below (see PT Problem List).  Pt able to state NWB on RLE status when asked. Pt required min assist for bed mobility and mod assist for transfers; pt with approximately 50% success maintaining NWB on RLE despite multimodal cuing. Pt reporting mild dizziness, BP in recliner post-transfer 137/95. HR97. Per daughter, pt is confused and has fallen recently while attempting to utilize rollator and RLE.  Pt will benefit from skilled PT to increase their independence and safety with mobility to allow discharge to the venue listed below.          Recommendations for follow up therapy are one component of a multi-disciplinary discharge planning process, led by the attending physician.  Recommendations may be updated based on patient status, additional functional criteria and insurance authorization.  Follow Up Recommendations Skilled nursing-short term rehab (<3 hours/day) (Return to [facility] and resume PT/OT services there) Can patient physically be transported by private vehicle: No    Assistance Recommended at Discharge Intermittent Supervision/Assistance  Patient can return home with the following  Help with stairs or ramp for entrance;Assist for transportation;Assistance with cooking/housework;A little help with bathing/dressing/bathroom;A lot of help with walking and/or transfers    Equipment  Recommendations Rolling walker (2 wheels)  Recommendations for Other Services       Functional Status Assessment Patient has had a recent decline in their functional status and demonstrates the ability to make significant improvements in function in a reasonable and predictable amount of time.     Precautions / Restrictions Precautions Precautions: Fall Precaution Comments: NWB RLE and confusion, has had falls in the past few weeks per daughter Restrictions Weight Bearing Restrictions: Yes RLE Weight Bearing: Non weight bearing      Mobility  Bed Mobility Overal bed mobility: Needs Assistance Bed Mobility: Supine to Sit     Supine to sit: Min assist, HOB elevated     General bed mobility comments: Min assist via chuck pad to elevate trunk.    Transfers Overall transfer level: Needs assistance Equipment used: Rolling walker (2 wheels) Transfers: Sit to/from Stand, Bed to chair/wheelchair/BSC Sit to Stand: Mod assist, +2 safety/equipment Stand pivot transfers: Mod assist, +2 safety/equipment         General transfer comment: Pt required mod assist for bringing weight forward and stabilizing RW during transfer, +2 for safety only, no increased physical assist. Pt's R foot on PT's foot to emphasize NWB on rLE. Stand pivot: pt required mod assist for AD management and turning, pt unable to take steps secnodary to BUE weakness, multimodal cuing to observe WB status, pt approximately 50% succesful. Pt reporting dizziness, resolved with sitting, BP stable, Further mobilityd eferred.    Ambulation/Gait               General Gait Details: unsafe at present  Stairs            Wheelchair Mobility    Modified Rankin (Stroke Patients Only)  Balance Overall balance assessment: Needs assistance Sitting-balance support: Feet supported, No upper extremity supported Sitting balance-Leahy Scale: Good     Standing balance support: Reliant on assistive device for  balance, During functional activity, Bilateral upper extremity supported Standing balance-Leahy Scale: Poor                               Pertinent Vitals/Pain Pain Assessment Pain Assessment: No/denies pain    Home Living Family/patient expects to be discharged to:: Assisted living                 Home Equipment: Rollator (4 wheels) Additional Comments: [name], pt has had falls.    Prior Function Prior Level of Function : Needs assist             Mobility Comments: Uses rollator for ambulation ADLs Comments: receives assist for IADLs, likely for showering     Hand Dominance        Extremity/Trunk Assessment   Upper Extremity Assessment Upper Extremity Assessment: Overall WFL for tasks assessed    Lower Extremity Assessment Lower Extremity Assessment: Overall WFL for tasks assessed (MMT not tested secondary to NWB on RLE)    Cervical / Trunk Assessment Cervical / Trunk Assessment: Kyphotic  Communication   Communication: HOH  Cognition Arousal/Alertness: Awake/alert Behavior During Therapy: WFL for tasks assessed/performed, Flat affect Overall Cognitive Status: Impaired/Different from baseline Area of Impairment: Following commands, Safety/judgement, Awareness, Orientation                 Orientation Level: Disoriented to, Time, Situation     Following Commands: Follows one step commands consistently, Follows one step commands with increased time Safety/Judgement: Decreased awareness of safety, Decreased awareness of deficits Awareness: Emergent   General Comments: Per daughter pt is more confused than baseline despite hx of dementia, suspects secnodary to UTI        General Comments General comments (skin integrity, edema, etc.): Daughter Hosie Spangle present    Exercises     Assessment/Plan    PT Assessment Patient needs continued PT services  PT Problem List Decreased strength;Decreased range of motion;Decreased  activity tolerance;Decreased balance;Decreased mobility;Decreased coordination;Decreased cognition;Decreased knowledge of use of DME;Decreased safety awareness;Pain       PT Treatment Interventions DME instruction;Gait training;Stair training;Functional mobility training;Therapeutic activities;Therapeutic exercise;Balance training;Patient/family education;Neuromuscular re-education    PT Goals (Current goals can be found in the Care Plan section)  Acute Rehab PT Goals Patient Stated Goal: Go home safely PT Goal Formulation: With patient/family Time For Goal Achievement: 03/23/22    Frequency Min 2X/week     Co-evaluation               AM-PAC PT "6 Clicks" Mobility  Outcome Measure Help needed turning from your back to your side while in a flat bed without using bedrails?: None Help needed moving from lying on your back to sitting on the side of a flat bed without using bedrails?: A Little Help needed moving to and from a bed to a chair (including a wheelchair)?: A Little Help needed standing up from a chair using your arms (e.g., wheelchair or bedside chair)?: A Little Help needed to walk in hospital room?: Total Help needed climbing 3-5 steps with a railing? : Total 6 Click Score: 15    End of Session Equipment Utilized During Treatment: Gait belt Activity Tolerance: Patient tolerated treatment well;No increased pain Patient left: in chair;with call bell/phone within reach;with chair  alarm set;with family/visitor present Nurse Communication: Mobility status PT Visit Diagnosis: Pain;Difficulty in walking, not elsewhere classified (R26.2);Muscle weakness (generalized) (M62.81);Unsteadiness on feet (R26.81) Pain - Right/Left: Right Pain - part of body: Ankle and joints of foot    Time: 5301-0404 PT Time Calculation (min) (ACUTE ONLY): 25 min   Charges:   PT Evaluation $PT Eval Low Complexity: 1 Low PT Treatments $Therapeutic Activity: 8-22 mins        Coolidge Breeze, PT, DPT [facility] Rehabilitation Department Office: [phone] Weekend pager: [phone]  Coolidge Breeze 03/09/2022, 11:58 AM

## 2022-03-09 NOTE — Progress Notes (Signed)
PROGRESS NOTE Kathy Velez  IRJ:[id number] DOB: Jul 31, 1931 DOA: 03/06/2022 PCP: Virgie Dad, MD   Brief Narrative/Hospital Course:  88 y.o. female with medical history significant of colon CA in 2006 status post segmental resection of transverse colon, and had evaluation for anemia on Nov/2022 and found to have a fungating partially obstructing mass at the splenic flexure positive for invasive adenocarcinoma family opted for nonsurgical management-treated with immunotherapy on splenic flexure colon cancer unable to tolerate continued immunotherapy this summer, she was on Eliquis but was stopped within the last few months w/ hx of hypothyroidism, HTN, A fib, GERD, HLD, RA, chronic hyponatremia. Presenting with rectal bleeding. She reports that she noticed BRBPR and clots starting 4 days ago.  Patient seen in the ED, GI was consulted and admitted.  Family had been reluctant for colonoscopy evaluation so CT scan was done to rule out recurrent malignancy-it showed partial colectomy, colonic diverticulosis with no acute diverticulitis. Patient has been having confusion over the past week intermittently.  She has had a fall with a scalp hematoma in September 2023. No more bleeding rectally hemoglobin stable after 1 unit PRBC transfusion.  Palliative care consulted plan for outpatient follow-up.  She has been having intermittent confusion UA abnormal and being treated for UTI.  Has had low sodium and salt tablet at home dosing increased 12/15    Subjective: Seen and examined.  Daughter at the bedside some residual confusion persist as per the daughter.  No other new complaints  BP stable in 120s to 140s. Sodium has been dropping further.  Assessment and Plan: Principal Problem:   GIB (gastrointestinal bleeding) Active Problems:   Essential hypertension   Rheumatoid arthritis (HCC)   Hypothyroidism   Mixed hyperlipidemia   GERD without esophagitis   Permanent atrial fibrillation (HCC)   Colon  cancer (HCC)   Rectal bleeding   Diverticulosis   Acute blood loss anemia   History of colon cancer   Diverticulosis of large intestine with hemorrhage  Rectal bleeding/GI bleeding: CT abdomen with no recurrence of cancer.  Sp 1 unit prbc, hb responded more than appropriately- hb fluctuating but has been stable > 9 gm. At this time no recurrence of bleeding, tolerating diet> GI advanced diet  Acute blood loss anemia due to rectal bleeding: s/p 1u PRB. Trend hb as below Recent Labs  Lab 03/08/22 0028 03/08/22 1105 03/08/22 1826 03/09/22 0121 03/09/22 0710  HGB 9.0* 9.6* 12.1 10.2* 9.6*  HCT 26.2* 28.9* 35.9* 29.9* 28.6*    History of colon cancer:colon CA in 2006 status post segmental resection of transverse colon, and had evaluation for anemia on Nov/2022 and found to have a fungating partially obstructing mass at the splenic flexure positive for invasive adenocarcinoma family opted for nonsurgical management-treated with immunotherapy on splenic flexure colon cancer unable to tolerate continued immunotherapy this summer.  CT abdomen on admission does not show recurrence of cancer.  Hyponatremia:on salt tab.?  From hypovolemia. Increase her home salt tab,check urine electrolytes.  Encourage oral intake. Recent Labs  Lab 03/06/22 1306 03/07/22 0100 03/08/22 1105 03/09/22 0711  NA 136 133* 127* 125*    Intermittent confusion past weeks Acute metabolic encephalopathy due to UTI: Previous similar episodes with UTI, UA does appear grossly abnormal urine culture sent and discharged on antibiotics 12/3.Mentation much improved.  Continue supportive care PT OT.   Klebsiella UTI > based on C/S changed to cefadroxil.   Hypothyroidism: Stable, continue home regimen when confirmed  HTN: bp was poorly controlled, increased Avapro  to 150 mg> on home metoprolol. BP stable now. Continue home prn clonidine.   Hx of A-fib : stable, continue metoprolol, does not appear to be anticoagulated due to  high risk GERD continue PPI HLD continue statin. RA: Continue home leflunomide  Recent right ankle fracture currently cast in place for 1 month, she has appointment for follow-up removal cast and further care on December 28 currently on nonweightbearing status  DVT prophylaxis: SCDs Start: 03/06/22 1733 Code Status:   Code Status: DNR Family Communication: plan of care discussed with patient/daughter at bedside. Patient status is: Inpatient because of bleeding and UTI Level of care: Telemetry   Dispo: The patient is from: snf            Anticipated disposition: snf over the weekend if sodium stable  Objective: Vitals last 24 hrs: Vitals:   03/08/22 0528 03/08/22 1336 03/08/22 1954 03/09/22 0457  BP: (!) 180/100 (!) 157/104 (!) 125/93 (!) 146/76  Pulse: 84 100 91 81  Resp: '18 20 18 18  '$ Temp: 97.6 F (36.4 C) 98.7 F (37.1 C) 98.7 F (37.1 C) (!) 97.5 F (36.4 C)  TempSrc: Oral Oral Oral Oral  SpO2: 95% 94% 95% 97%  Weight:      Height:       Weight change:   Physical Examination: General exam: Aaox2-3,weak,older appearing HEENT:Oral mucosa moist, Ear/Nose WNL grossly, dentition normal. Respiratory system: bilaterally clear,no use of accessory muscle Cardiovascular system: S1 & S2 +, regular rate, JVD neg. Gastrointestinal system: Abdomen soft, NT,ND,BS+ Nervous System:Alert, awake, moving extremities and grossly nonfocal Extremities: LE ankle edema neg, lower extremities warm Skin: No rashes,no icterus. MSK: Normal muscle bulk,tone, power    Medications reviewed:  Scheduled Meds:  sodium chloride   Intravenous Once   atorvastatin  20 mg Oral QHS   cefadroxil  500 mg Oral BID   cholecalciferol  2,000 Units Oral Daily   digoxin  0.125 mg Oral Daily   feeding supplement  1 Container Oral TID BM   [START ON 03/10/2022] ferrous sulfate  325 mg Oral Q breakfast   irbesartan  150 mg Oral QHS   leflunomide  20 mg Oral Daily   levothyroxine  100 mcg Oral Q0600    melatonin  3 mg Oral QHS   metoprolol succinate  50 mg Oral Daily   mirabegron ER  25 mg Oral Daily   mirtazapine  15 mg Oral QHS   multivitamin with minerals  1 tablet Oral Daily   ondansetron (ZOFRAN) IV  4 mg Intravenous Once   pantoprazole  80 mg Oral q morning   sodium chloride  1 g Oral TID WC   Continuous Infusions:      Diet Order             DIET SOFT Room service appropriate? No; Fluid consistency: Thin  Diet effective now                 No intake or output data in the 24 hours ending 03/09/22 1046  Net IO Since Admission: -15 mL [03/09/22 1046]  Wt Readings from Last 3 Encounters:  03/06/22 55.5 kg  03/02/22 55.5 kg  02/23/22 55.8 kg     Unresulted Labs (From admission, onward)     Start     Ordered   03/09/22 0846  Sodium, urine, random  Once,   R        03/09/22 0845   03/09/22 0846  Osmolality, urine  Once,   R  03/09/22 0845   03/08/22 0814  Basic metabolic panel  Daily,   R     Question:  Specimen collection method  Answer:  Lab=Lab collect   03/07/22 1324   03/08/22 0500  CBC  Daily,   R     Question:  Specimen collection method  Answer:  Lab=Lab collect   03/07/22 1324          Data Reviewed: I have personally reviewed following labs and imaging studies CBC: Recent Labs  Lab 03/06/22 1306 03/06/22 1930 03/07/22 0100 03/07/22 1018 03/08/22 0028 03/08/22 1105 03/08/22 1826 03/09/22 0121 03/09/22 0710  WBC 9.4  --  9.0  --   --  12.6*  --   --  11.4*  NEUTROABS 6.9  --   --   --   --   --   --   --   --   HGB 8.3*   < > 7.5*   < > 9.0* 9.6* 12.1 10.2* 9.6*  HCT 25.4*   < > 22.8*   < > 26.2* 28.9* 35.9* 29.9* 28.6*  MCV 104.1*  --  103.6*  --   --  100.3*  --   --  100.0  PLT 193  --  189  --   --  177  --   --  212   < > = values in this interval not displayed.   Basic Metabolic Panel: Recent Labs  Lab 03/06/22 1306 03/07/22 0100 03/08/22 1105 03/09/22 0711  NA 136 133* 127* 125*  K 4.3 4.6 3.9 4.2  CL 102 103 92*  91*  CO2 '27 22 24 26  '$ GLUCOSE 119* 100* 138* 99  BUN '23 18 12 '$ 24*  CREATININE 0.78 0.68 0.52 0.83  CALCIUM 8.8* 8.7* 8.6* 8.5*   Recent Results (from the past 240 hour(s))  Urine Culture     Status: Abnormal   Collection Time: 03/06/22  1:06 PM   Specimen: Urine, Clean Catch  Result Value Ref Range Status   Specimen Description   Final    URINE, CLEAN CATCH Performed at [facility], [place] [address] [place], [place] [address]    Special Requests   Final    NONE Performed at [facility], [place] [address], [place], [place] [address]    Culture >=100,000 COLONIES/mL KLEBSIELLA PNEUMONIAE (A)  Final   Report Status 03/09/2022 FINAL  Final   Organism ID, Bacteria KLEBSIELLA PNEUMONIAE (A)  Final      Susceptibility   Klebsiella pneumoniae - MIC*    AMPICILLIN >=32 RESISTANT Resistant     CEFAZOLIN <=4 SENSITIVE Sensitive     CEFEPIME <=0.12 SENSITIVE Sensitive     CEFTRIAXONE <=0.25 SENSITIVE Sensitive     CIPROFLOXACIN <=0.25 SENSITIVE Sensitive     GENTAMICIN <=1 SENSITIVE Sensitive     IMIPENEM <=0.25 SENSITIVE Sensitive     NITROFURANTOIN <=16 SENSITIVE Sensitive     TRIMETH/SULFA <=20 SENSITIVE Sensitive     AMPICILLIN/SULBACTAM 4 SENSITIVE Sensitive     PIP/TAZO <=4 SENSITIVE Sensitive     * >=100,000 COLONIES/mL KLEBSIELLA PNEUMONIAE    Antimicrobials: Anti-infectives (From admission, onward)    Start     Dose/Rate Route Frequency Ordered Stop   03/09/22 1000  cefadroxil (DURICEF) capsule 500 mg        500 mg Oral 2 times daily 03/09/22 0942     03/07/22 1000  cefTRIAXone (ROCEPHIN) 1 g in sodium chloride 0.9 % 100 mL IVPB  Status:  Discontinued        1 g 200 mL/hr over 30 Minutes Intravenous Every 24 hours 03/07/22 0947 03/09/22 0942      Culture/Microbiology    Component Value Date/Time   SDES  03/06/2022 1306    URINE, CLEAN CATCH Performed at [facility], [place] [address], [place], [place] [address]     SPECREQUEST  03/06/2022 1306    NONE Performed at [facility], [place] [address], [place], [place] [address]    CULT >=100,000 COLONIES/mL KLEBSIELLA PNEUMONIAE (A) 03/06/2022 1306   REPTSTATUS 03/09/2022 FINAL 03/06/2022 1306  Radiology Studies: No results found.   LOS: 2 days   Antonieta Pert, MD [facility]  03/09/2022, 10:46 AM

## 2022-03-09 NOTE — Evaluation (Signed)
Occupational Therapy Evaluation Patient Details Name: Kathy Velez MRN: [id number] DOB: September 01, 1931 Today's Date: 03/09/2022   History of Present Illness Pt is an 88 year old female presenting to the ED with rectal bleeding, admitted for continued work up; PMH significant for  hypothyroidism, HTN, A fib, GERD, HLD, RA, chronic hyponatremia, colon CA 2006 s/p segmental resection of transverse colon, and had evaluation for anemia on Nov/2022 and found to have a fungating partially obstructing mass at the splenic flexure positive for invasive adenocarcinoma, opted for nonsurgical management-treated with immunotherapy   Clinical Impression   Chart reviewed, pt greeted in room agreeable to OT evaluation. Pt is alert, oriented x4 however presents with deficits in STM, processing (requiring increased time/verbal/tactile cues), fair awareness of current level of deficits. Pt follows simple one step directions. PT is a ?historian, per chart was in ALF, abm with rollator, fall history. PLOF will need to be confirmed. Pt has a hard cast on RLE, reports this is from a fall from daughter's car. Pt presents with deficits in strength, endurance, activity tolerance, balance, cognition all affecting safe and optimal ADL completion. Recommend discharge to STR to address functional deficits and to facilitate return to PLOF. Pt is left in bedside chair, all needs met. OT will follow acutely.      Recommendations for follow up therapy are one component of a multi-disciplinary discharge planning process, led by the attending physician.  Recommendations may be updated based on patient status, additional functional criteria and insurance authorization.   Follow Up Recommendations  Skilled nursing-short term rehab (<3 hours/day)     Assistance Recommended at Discharge Frequent or constant Supervision/Assistance  Patient can return home with the following A lot of help with walking and/or transfers;A lot of help with  bathing/dressing/bathroom    Functional Status Assessment  Patient has had a recent decline in their functional status and demonstrates the ability to make significant improvements in function in a reasonable and predictable amount of time.  Equipment Recommendations  None recommended by OT;Other (comment) (per next venue of care)    Recommendations for Other Services       Precautions / Restrictions Precautions Precautions: Fall Precaution Comments: NWB RLE and confusion, has had falls in the past few weeks per daughter Restrictions Weight Bearing Restrictions: Yes RLE Weight Bearing: Non weight bearing Other Position/Activity Restrictions: in hard cast      Mobility Bed Mobility               General bed mobility comments: NT in recliner pre/post session    Transfers Overall transfer level: Needs assistance Equipment used: Rolling walker (2 wheels) Transfers: Sit to/from Stand Sit to Stand: Min assist, Mod assist                  Balance Overall balance assessment: Needs assistance Sitting-balance support: Feet supported, No upper extremity supported Sitting balance-Leahy Scale: Good     Standing balance support: Reliant on assistive device for balance, During functional activity, Bilateral upper extremity supported Standing balance-Leahy Scale: Poor                             ADL either performed or assessed with clinical judgement   ADL Overall ADL's : Needs assistance/impaired Eating/Feeding: Set up;Sitting   Grooming: Wash/dry hands;Sitting;Set up       Lower Body Bathing: Maximal assistance;Sit to/from stand   Upper Body Dressing : Minimal assistance;Sitting Upper Body Dressing Details (indicate cue  type and reason): doff/donn gown Lower Body Dressing: Maximal assistance;Sit to/from stand Lower Body Dressing Details (indicate cue type and reason): doff wet underwear Toilet Transfer: Minimal assistance;Moderate  assistance;Rolling walker (2 wheels) Toilet Transfer Details (indicate cue type and reason): simulated Toileting- Clothing Manipulation and Hygiene: Maximal assistance;Sit to/from stand Toileting - Clothing Manipulation Details (indicate cue type and reason): pt found to be wet in chair     Functional mobility during ADLs: Minimal assistance;Rolling walker (2 wheels);Cueing for safety;Cueing for sequencing (approx 2' forward with RW, pt attempting to put RLE down to take a step, unable to hop)       Vision Patient Visual Report: No change from baseline       Perception     Praxis      Pertinent Vitals/Pain Pain Assessment Pain Assessment: No/denies pain     Hand Dominance     Extremity/Trunk Assessment Upper Extremity Assessment Upper Extremity Assessment: Generalized weakness   Lower Extremity Assessment Lower Extremity Assessment: Generalized weakness   Cervical / Trunk Assessment Cervical / Trunk Assessment: Kyphotic   Communication Communication Communication: HOH   Cognition Arousal/Alertness: Awake/alert Behavior During Therapy: WFL for tasks assessed/performed Overall Cognitive Status: No family/caregiver present to determine baseline cognitive functioning Area of Impairment: Attention, Memory, Following commands, Safety/judgement, Awareness, Problem solving                 Orientation Level:  (oriented to "hospital" not WL) Current Attention Level: Sustained Memory: Decreased short-term memory Following Commands: Follows one step commands with increased time Safety/Judgement: Decreased awareness of safety, Decreased awareness of deficits Awareness: Emergent Problem Solving: Slow processing, Requires verbal cues, Requires tactile cues       General Comments  vital signs monitored throughout, appear stable    Exercises Other Exercises Other Exercises: edu re: role of OT, role of rehab, discharge recommendations, home safety, falls prevention, DME  use   Shoulder Instructions      Home Living Family/patient expects to be discharged to:: Assisted living                             Home Equipment: Rollator (4 wheels);Wheelchair - manual   Additional Comments: ?historian, reports she has been using mwc due to fall resulting in ankle fracture      Prior Functioning/Environment Prior Level of Function : Needs assist             Mobility Comments: amb with rollator, ?use of mwc since akle break ADLs Comments: pt reports generally she performs dressing, grooming with MOD I, assist for showering, IADLs        OT Problem List: Decreased strength;Decreased activity tolerance;Impaired balance (sitting and/or standing);Decreased safety awareness;Decreased knowledge of use of DME or AE;Decreased knowledge of precautions      OT Treatment/Interventions: Self-care/ADL training;Patient/family education;Therapeutic exercise;Balance training;Energy conservation;Therapeutic activities;DME and/or AE instruction    OT Goals(Current goals can be found in the care plan section) Acute Rehab OT Goals Patient Stated Goal: continue rehab OT Goal Formulation: With patient Time For Goal Achievement: 03/23/22 Potential to Achieve Goals: Good ADL Goals Pt Will Perform Grooming: with supervision;sitting Pt Will Perform Lower Body Dressing: with supervision;sit to/from stand Pt Will Transfer to Toilet: with supervision Pt Will Perform Toileting - Clothing Manipulation and hygiene: with supervision;sit to/from stand  OT Frequency: Min 2X/week    Co-evaluation              AM-PAC OT "6 Clicks" Daily  Activity     Outcome Measure Help from another person eating meals?: None Help from another person taking care of personal grooming?: None Help from another person toileting, which includes using toliet, bedpan, or urinal?: A Lot Help from another person bathing (including washing, rinsing, drying)?: A Lot Help from another person  to put on and taking off regular upper body clothing?: A Little Help from another person to put on and taking off regular lower body clothing?: A Lot 6 Click Score: 17   End of Session Equipment Utilized During Treatment: Rolling walker (2 wheels) Nurse Communication: Other (comment) (NT re pt need for new pure wik)  Activity Tolerance: Patient tolerated treatment well Patient left: in chair;with call bell/phone within reach;with chair alarm set  OT Visit Diagnosis: Unsteadiness on feet (R26.81);History of falling (Z91.81)                Time: 1212-1227 OT Time Calculation (min): 15 min Charges:  OT General Charges $OT Visit: 1 Visit OT Evaluation $OT Eval Low Complexity: 1 Low Shanon Payor, OTD OTR/L  03/09/22, 12:41 PM

## 2022-03-10 DIAGNOSIS — K922 Gastrointestinal hemorrhage, unspecified: Secondary | ICD-10-CM | POA: Diagnosis not present

## 2022-03-10 LAB — CBC
HCT: 30.5 % — ABNORMAL LOW (ref 36.0–46.0)
Hemoglobin: 10.1 g/dL — ABNORMAL LOW (ref 12.0–15.0)
MCH: 33.8 pg (ref 26.0–34.0)
MCHC: 33.1 g/dL (ref 30.0–36.0)
MCV: 102 fL — ABNORMAL HIGH (ref 80.0–100.0)
Platelets: 222 10*3/uL (ref 150–400)
RBC: 2.99 MIL/uL — ABNORMAL LOW (ref 3.87–5.11)
RDW: 15.7 % — ABNORMAL HIGH (ref 11.5–15.5)
WBC: 10.1 10*3/uL (ref 4.0–10.5)
nRBC: 0 % (ref 0.0–0.2)

## 2022-03-10 LAB — BASIC METABOLIC PANEL
Anion gap: 7 (ref 5–15)
BUN: 26 mg/dL — ABNORMAL HIGH (ref 8–23)
CO2: 26 mmol/L (ref 22–32)
Calcium: 8.9 mg/dL (ref 8.9–10.3)
Chloride: 98 mmol/L (ref 98–111)
Creatinine, Ser: 0.75 mg/dL (ref 0.44–1.00)
GFR, Estimated: >60 mL/min (ref >60)
Glucose, Bld: 97 mg/dL (ref 70–99)
Potassium: 4.2 mmol/L (ref 3.5–5.1)
Sodium: 131 mmol/L — ABNORMAL LOW (ref 135–145)

## 2022-03-10 MED ORDER — MELATONIN 3 MG PO TABS: 3.0000 mg | ORAL_TABLET | Freq: Every evening | ORAL | 0 refills | Status: DC | PRN

## 2022-03-10 MED ORDER — SODIUM CHLORIDE 1 G PO TABS: 1.0000 g | ORAL_TABLET | Freq: Two times a day (BID) | ORAL | Status: DC

## 2022-03-10 MED ORDER — CEFADROXIL 500 MG PO CAPS: 500.0000 mg | ORAL_CAPSULE | Freq: Two times a day (BID) | ORAL | 0 refills | Status: AC

## 2022-03-10 NOTE — Progress Notes (Signed)
Gave report to PTAR and provided discharge paperwork. Called daughter to inform her that patient is on the way back to [name] and to confirm building & room number.

## 2022-03-10 NOTE — TOC Transition Note (Signed)
Transition of Care [facility]) - CM/SW Discharge Note   Patient Details  Name: Kathy Velez MRN: [id number] Date of Birth: [birth year]  [facility]) CM/SW Contact:  Illene Regulus, LCSW Phone Number: 03/10/2022, 12:42 PM   Clinical Narrative:    CSW spoke with Santiago Glad from [facility], she is requesting FL2 and d/c summary. Pt daughter is requesting EMS transport. PTAR arranged for transport . No additional needs.           Patient Goals and CMS Choice        Discharge Placement                       Discharge Plan and Services                                     Social Determinants of Health (SDOH) Interventions     Readmission Risk Interventions     No data to display

## 2022-03-10 NOTE — TOC Progression Note (Incomplete)
Transition of Care [facility]) - Progression Note    Patient Details  Name: Kathy Velez MRN: [id number] Date of Birth: 01/06/1932  Transition of Care [facility]) CM/SW [place], LCSW Phone Number: 03/10/2022, 12:18 PM  Clinical Narrative:     TOC CSW called [facility] about pt's d/c was told they will call me back.         Expected Discharge Plan and Services           Expected Discharge Date: 03/10/22                                     Social Determinants of Health (SDOH) Interventions    Readmission Risk Interventions     No data to display

## 2022-03-10 NOTE — NC FL2 (Signed)
[place] LEVEL OF CARE FORM     IDENTIFICATION  Patient Name: Kathy Velez Birthdate: Jul 27, 1931 Sex: female Admission Date (Current Location): 03/06/2022  [facility] and [place] Number:  Herbalist and Address:  [facility],  [place], [place]      Provider Number: [id number]  Attending Physician Name and Address:  Antonieta Pert, MD  Relative Name and Phone Number:  Di Kindle (Daughter) [phone] (Mobile    Current Level of Care: Hospital Recommended Level of Care: [place] Prior Approval Number:    Date Approved/Denied:   PASRR Number: [id number]  Discharge Plan: SNF    Current Diagnoses: Patient Active Problem List   Diagnosis Date Noted   Palliative care by specialist 03/09/2022   Goals of care, counseling/discussion 03/09/2022   Rectal bleeding 03/08/2022   Diverticulosis 03/08/2022   Acute blood loss anemia 03/08/2022   History of colon cancer 03/08/2022   Diverticulosis of large intestine with hemorrhage 03/08/2022   GIB (gastrointestinal bleeding) 03/06/2022   Primary osteoarthritis 02/06/2022   Closed bimalleolar fracture of right ankle 02/02/2022   ABLA (acute blood loss anemia) 12/07/2021   Scalp hematoma 12/06/2021   GERD without esophagitis 12/06/2021   Frequent falls 12/06/2021   Hyponatremia 12/06/2021   Aortic atherosclerosis ([place]) 07/26/2021   Major neurocognitive disorder, due to vascular disease, without behavioral disturbance, mild ([place]) 04/25/2021   Fecal incontinence 04/25/2021   Colon cancer ([place]) 04/25/2021   Periorbital hematoma of right eye 09/06/2020   Closed fracture of nasal bones 09/06/2020   Closed fracture of orbit ([place]) 09/06/2020   Retrobulbar hematoma 09/06/2020   Periorbital hematoma 09/06/2020   Psychophysiological insomnia 10/11/2019   DNR (do not resuscitate) 10/11/2019   Transient ischemic attack (TIA) 03/09/2019   Pain in right knee 02/12/2018    Osteopenia of forearm 12/11/2017   Primary osteoarthritis of both knees 12/11/2017   Permanent atrial fibrillation ([place]) 11/22/2017   Hypercoagulable state due to atrial fibrillation ([place]) 10/11/2017   A-fib ([place]) 10/11/2017   Diplopia 10/11/2017   Presbycusis of both ears 10/11/2017   Acquired hammertoes of both feet 10/11/2017   Macrocytosis without anemia 10/11/2017   Genu valgum, right 10/11/2017   History of multiple strokes 10/11/2017   Mild cognitive impairment with memory loss 10/11/2017   Overactive bladder 10/11/2017   Mixed hyperlipidemia 09/03/2017   History of colon cancer, stage III 01/17/2017   Carotid stenosis 12/13/2016   Chronic congestion of paranasal sinus 11/13/2016   Chronic seasonal allergic rhinitis 06/06/2016   Interstitial lung disease ([place]) 12/28/2015   Hemispheric carotid artery syndrome 07/18/2015   Hypothyroidism 05/05/2015   Rheumatoid arthritis ([place]) 05/05/2015   Constipation 05/05/2015   Vitamin B12 deficiency 05/05/2015   Iron deficiency anemia 05/02/2015   Back pain 05/02/2015   Preop cardiovascular exam    Atrial flutter ([place]) 04/13/2015   Status post mitral valve repair 04/13/2015   Status post tricuspid valve repair 04/13/2015   Spondylolisthesis of lumbar region 04/12/2015   Spinal stenosis of lumbar region 02/02/2015   RLS (restless legs syndrome) 11/10/2013   Essential hypertension 07/14/2013   Depression 07/02/2011    Orientation RESPIRATION BLADDER Height & Weight     Self, Place  Normal Incontinent Weight: 122 lb 6.4 oz (55.5 kg) Height:  '5\' 2"'$  (157.5 cm)  BEHAVIORAL SYMPTOMS/MOOD NEUROLOGICAL BOWEL NUTRITION STATUS      Continent Diet  AMBULATORY STATUS COMMUNICATION OF NEEDS Skin   Limited Assist Verbally Normal  Personal Care Assistance Level of Assistance  Bathing, Feeding, Dressing Bathing Assistance: Limited assistance Feeding assistance: Independent Dressing Assistance: Limited  assistance     Functional Limitations Info  Sight, Hearing, Speech Sight Info: Adequate Hearing Info: Adequate Speech Info: Adequate    SPECIAL CARE FACTORS FREQUENCY  PT (By licensed PT), OT (By licensed OT)     PT Frequency: 5x a week OT Frequency: 5x a week            Contractures Contractures Info: Not present    Additional Factors Info  Code Status, Allergies Code Status Info: DNR Allergies Info: Claritin (loratadine),Codeine,Gabapentin,Molds & Smuts,Pollen Extract,Bee Venom,Wasp Venom           Current Medications (03/10/2022):  This is the current hospital active medication list Current Facility-Administered Medications  Medication Dose Route Frequency Provider Last Rate Last Admin   0.9 %  sodium chloride infusion (Manually program via Guardrails IV Fluids)   Intravenous Once Kathryne Eriksson, NP       acetaminophen (TYLENOL) tablet 650 mg  650 mg Oral Q6H PRN Kc, Maren Beach, MD   650 mg at 03/08/22 2231   atorvastatin (LIPITOR) tablet 20 mg  20 mg Oral QHS Kyle, Tyrone A, DO   20 mg at 03/09/22 2051   cefadroxil (DURICEF) capsule 500 mg  500 mg Oral BID Kc, Maren Beach, MD   500 mg at 03/10/22 0930   cholecalciferol (VITAMIN D3) 25 MCG (1000 UNIT) tablet 2,000 Units  2,000 Units Oral Daily Kyle, Tyrone A, DO   2,000 Units at 03/10/22 0930   cloNIDine (CATAPRES) tablet 0.1 mg  0.1 mg Oral PRN Antonieta Pert, MD       digoxin (LANOXIN) tablet 0.125 mg  0.125 mg Oral Daily Kyle, Tyrone A, DO   0.125 mg at 03/10/22 0930   feeding supplement (BOOST / RESOURCE BREEZE) liquid 1 Container  1 Container Oral TID BM [place], Amy S, PA-C   1 Container at 03/10/22 0934   ferrous sulfate tablet 325 mg  325 mg Oral Q breakfast Kc, Ramesh, MD   325 mg at 03/10/22 1210   irbesartan (AVAPRO) tablet 150 mg  150 mg Oral QHS Kc, Maren [place], MD   150 mg at 03/09/22 2052   leflunomide (ARAVA) tablet 20 mg  20 mg Oral Daily Kc, Ramesh, MD   20 mg at 03/10/22 0931   levothyroxine (SYNTHROID) tablet 100  mcg  100 mcg Oral Q0600 Marylyn Ishihara, Tyrone A, DO   100 mcg at 03/10/22 3532   melatonin tablet 3 mg  3 mg Oral QHS Kc, Ramesh, MD   3 mg at 03/09/22 2052   metoprolol succinate (TOPROL-XL) 24 hr tablet 50 mg  50 mg Oral Daily Kyle, Tyrone A, DO   50 mg at 03/10/22 0930   metoprolol tartrate (LOPRESSOR) injection 5 mg  5 mg Intravenous Q6H PRN Marylyn Ishihara, Tyrone A, DO   5 mg at 03/06/22 1946   mirabegron ER (MYRBETRIQ) tablet 25 mg  25 mg Oral Daily Kc, Ramesh, MD   25 mg at 03/10/22 0931   mirtazapine (REMERON) tablet 15 mg  15 mg Oral QHS Kyle, Tyrone A, DO   15 mg at 03/09/22 2051   multivitamin with minerals tablet 1 tablet  1 tablet Oral Daily Kyle, Tyrone A, DO   1 tablet at 03/10/22 0930   ondansetron (ZOFRAN) injection 4 mg  4 mg Intravenous Once Kathryne Eriksson, NP       ondansetron [facility]) injection 4 mg  4  mg Intravenous Q6H PRN Raenette Rover, NP   4 mg at 03/07/22 2330   pantoprazole (PROTONIX) EC tablet 80 mg  80 mg Oral q morning Kyle, Tyrone A, DO   80 mg at 03/10/22 0930   sodium chloride tablet 1 g  1 g Oral TID WC Antonieta Pert, MD   1 g at 03/10/22 1210     Discharge Medications: Please see discharge summary for a list of discharge medications.  Relevant Imaging Results:  Relevant Lab Results:   Additional Information SSN - [id number]  [place], LCSW

## 2022-03-10 NOTE — Discharge Summary (Signed)
Physician Discharge Summary  Kathy Velez HRC:[id number] DOB: [birth year] DOA: 03/06/2022  PCP: Virgie Dad, MD  Admit date: 03/06/2022 Discharge date: 03/10/2022 Recommendations for Outpatient Follow-up:  Follow up with PCP in 1 weeks-call for appointment Please obtain BMP/CBC in one week  Discharge Dispo: SNF Discharge Condition: Stable Code Status:   Code Status: DNR Diet recommendation:  Diet Order             DIET SOFT Room service appropriate? No; Fluid consistency: Thin  Diet effective now                    Brief/Interim Summary:  88 y.o. female with medical history significant of colon CA in 2006 status post segmental resection of transverse colon, and had evaluation for anemia on Nov/2022 and found to have a fungating partially obstructing mass at the splenic flexure positive for invasive adenocarcinoma family opted for nonsurgical management-treated with immunotherapy on splenic flexure colon cancer unable to tolerate continued immunotherapy this summer, she was on Eliquis but was stopped within the last few months w/ hx of hypothyroidism, HTN, A fib, GERD, HLD, RA, chronic hyponatremia. Presenting with rectal bleeding. She reports that she noticed BRBPR and clots starting 4 days ago.  Patient seen in the ED, GI was consulted and admitted.  Family had been reluctant for colonoscopy evaluation so CT scan was done to rule out recurrent malignancy-it showed partial colectomy, colonic diverticulosis with no acute diverticulitis. Patient has been having confusion over the past week intermittently.  She has had a fall with a scalp hematoma in September 2023. No more bleeding rectally hemoglobin stable after 1 unit PRBC transfusion.  Palliative care consulted plan for outpatient follow-up.  She has been having intermittent confusion UA abnormal and being treated for UTI.  Has had low sodium and salt tablet at home dosing increased 12/15 Sodium is stabilized, H&H improved at this  time tolerating diet and medically stable for discharge back to facility.  Discharge Diagnoses:  Principal Problem:   GIB (gastrointestinal bleeding) Active Problems:   Essential hypertension   Rheumatoid arthritis ([place])   Hypothyroidism   Mixed hyperlipidemia   GERD without esophagitis   Permanent atrial fibrillation (HCC)   Colon cancer (HCC)   Rectal bleeding   Diverticulosis   Acute blood loss anemia   History of colon cancer   Diverticulosis of large intestine with hemorrhage   Palliative care by specialist   Goals of care, counseling/discussion  Rectal bleeding/GI bleeding: CT abdomen with no recurrence of cancer.  Sp 1 unit prbc, hb responded more than appropriately- hb fluctuating but has been stable > 9 gm. At this time no recurrence of bleeding, tolerating diet> GI advanced diet.  Hemoglobin stable, she is tolerating diet.  > Will d/c back to facility today  Acute blood loss anemia due to rectal bleeding: s/p 1u PRB. Trend hb as below Recent Labs  Lab 03/08/22 1105 03/08/22 1826 03/09/22 0121 03/09/22 0710 03/10/22 0820  HGB 9.6* 12.1 10.2* 9.6* 10.1*  HCT 28.9* 35.9* 29.9* 28.6* 30.5*    History of colon cancer:colon CA in 2006 status post segmental resection of transverse colon, and had evaluation for anemia on Nov/2022 and found to have a fungating partially obstructing mass at the splenic flexure positive for invasive adenocarcinoma family opted for nonsurgical management-treated with immunotherapy on splenic flexure colon cancer unable to tolerate continued immunotherapy this summer.  CT abdomen on admission does not show recurrence of cancer.  Hyponatremia:on salt tab.?  From  hypovolemia. Increased her home salt tab and sodium improved at 131 and follow-up outpatient BMP in a week. Recent Labs  Lab 03/06/22 1306 03/07/22 0100 03/08/22 1105 03/09/22 0711 03/10/22 0820  NA 136 133* 127* 125* 131*    Intermittent confusion past weeks Acute metabolic  encephalopathy due to UTI: Previous similar episodes with UTI, UA does appear grossly abnormal urine culture sent and discharged on antibiotics 12/3.Mentation much improved.  Continue supportive care PT OT.   Klebsiella UTI > based on C/S changed to cefadroxil AND complete outpatient.   Hypothyroidism: Stable, continue home regimen when confirmed  HTN: bp was poorly controlled, increased Avapro to 150 mg> on home metoprolol. BP stable now. Continue home prn clonidine.   Hx of A-fib : stable, continue metoprolol, does not appear to be anticoagulated due to high risk GERD continue PPI HLD continue statin. RA: Continue home leflunomide  Recent right ankle fracture currently cast in place for 1 month, she has appointment for follow-up removal cast and further care on December 28 currently on nonweightbearing status    Consults: GI Subjective: Alert awake resting comfortably no new complaints  Discharge Exam: Vitals:   03/09/22 2030 03/10/22 0516  BP: (!) 151/95 (!) 149/90  Pulse: 79 72  Resp: 16 18  Temp: 98.1 F (36.7 C) 98 F (36.7 C)  SpO2: 97% 96%   General: Pt is alert, awake, not in acute distress Cardiovascular: RRR, S1/S2 +, no rubs, no gallops Respiratory: CTA bilaterally, no wheezing, no rhonchi Abdominal: Soft, NT, ND, bowel sounds + Extremities: no edema, no cyanosis  Discharge Instructions  Discharge Instructions     Discharge instructions   Complete by: As directed    Check CBC in 1 week.  Please call call MD or return to ER for similar or worsening recurring problem that brought you to hospital or if any fever,nausea/vomiting,abdominal pain, uncontrolled pain, chest pain,  shortness of breath or any other alarming symptoms.  Please follow-up your doctor as instructed in a week time and call the office for appointment.  Please avoid alcohol, smoking, or any other illicit substance and maintain healthy habits including taking your regular medications as  prescribed.  You were cared for by a hospitalist during your hospital stay. If you have any questions about your discharge medications or the care you received while you were in the hospital after you are discharged, you can call the unit and ask to speak with the hospitalist on call if the hospitalist that took care of you is not available.  Once you are discharged, your primary care physician will handle any further medical issues. Please note that NO REFILLS for any discharge medications will be authorized once you are discharged, as it is imperative that you return to your primary care physician (or establish a relationship with a primary care physician if you do not have one) for your aftercare needs so that they can reassess your need for medications and monitor your lab values   Increase activity slowly   Complete by: As directed       Allergies as of 03/10/2022       Reactions   Claritin [loratadine] Other (See Comments)   Listed on MAR Unknown reaction   Codeine Other (See Comments)   "just don't take it well"   Gabapentin Other (See Comments)   Dizziness    Molds & Smuts Other (See Comments)   Dust.  Reaction is not listed on MAR    Pollen Extract Swelling  Grass   Bee Venom Swelling, Rash   Wasp Venom Swelling, Rash        Medication List     TAKE these medications    acetaminophen 500 MG tablet Commonly known as: TYLENOL Take 1,000 mg by mouth 2 (two) times daily as needed for mild pain.   atorvastatin 20 MG tablet Commonly known as: LIPITOR TAKE 1 TABLET DAILY What changed: when to take this   cefadroxil 500 MG capsule Commonly known as: DURICEF Take 1 capsule (500 mg total) by mouth 2 (two) times daily for 4 days.   cloNIDine 0.1 MG tablet Commonly known as: CATAPRES Take 1 tablet (0.1 mg total) by mouth daily as needed. If SBP >180 only What changed:  when to take this reasons to take this additional instructions   cyanocobalamin 1000 MCG  tablet Commonly known as: VITAMIN B12 Take 1,000 mcg by mouth in the morning.   digoxin 0.125 MG tablet Commonly known as: LANOXIN TAKE 1 TABLET DAILY   Gemtesa 75 MG Tabs Generic drug: Vibegron Take 32.5 mg by mouth at bedtime.   hydrocortisone 25 MG suppository Commonly known as: ANUSOL-HC Place 1 suppository (25 mg total) rectally daily.   Iron (Ferrous Sulfate) 325 (65 Fe) MG Tabs Take 325 mg by mouth daily.   leflunomide 20 MG tablet Commonly known as: ARAVA Take 20 mg by mouth in the morning.   levalbuterol 0.63 MG/3ML nebulizer solution Commonly known as: Xopenex Take 3 mLs (0.63 mg total) by nebulization in the morning and at bedtime. What changed:  when to take this reasons to take this   levothyroxine 100 MCG tablet Commonly known as: Synthroid Take 1 tablet (100 mcg total) by mouth daily before breakfast.   melatonin 3 MG Tabs tablet Take 1 tablet (3 mg total) by mouth at bedtime as needed.   metoprolol succinate 50 MG 24 hr tablet Commonly known as: TOPROL-XL TAKE 1 TABLET DAILY   mirtazapine 15 MG tablet Commonly known as: REMERON TAKE 1 TABLET AT BEDTIME   multivitamin tablet Take 1 tablet by mouth in the morning.   NON FORMULARY Fluid restriction 1800cc daily  1st- 900 cc 2nd- 700 cc 3rd- 200 cc   omeprazole 40 MG capsule Commonly known as: PRILOSEC TAKE 1 CAPSULE DAILY What changed: additional instructions   ondansetron 4 MG tablet Commonly known as: ZOFRAN Take 4 mg by mouth every 6 (six) hours as needed for nausea or vomiting.   PRESCRIPTION MEDICATION Take 1 g by mouth daily. Sodium 1 g   PreserVision AREDS 2 Caps Take 1 capsule by mouth 2 (two) times daily.   shark liver oil-cocoa butter 0.25-3-85.5 % suppository Commonly known as: PREPARATION H Place 1 suppository rectally at bedtime. For 7 days starting 03/02/22   sodium chloride 1 g tablet Take 1 tablet (1 g total) by mouth 2 (two) times daily with a meal. What changed:  when to take this   valsartan 80 MG tablet Commonly known as: DIOVAN Take 60 mg by mouth at bedtime. Increase valsartan to 60 mg by mouth daily and report if BP is not consistently <150/90 per [facility] What changed: Another medication with the same name was removed. Continue taking this medication, and follow the directions you see here.   Vitamin D 50 MCG (2000 UT) Caps Take 2,000 Units by mouth in the morning.        Follow-up Information     Virgie Dad, MD Follow up in 1 week(s).   Specialty:  Internal Medicine Contact information: [place] [address] [phone]                Allergies  Allergen Reactions   Claritin [Loratadine] Other (See Comments)    Listed on MAR Unknown reaction   Codeine Other (See Comments)    "just don't take it well"   Gabapentin Other (See Comments)    Dizziness    Molds & Smuts Other (See Comments)    Dust.  Reaction is not listed on MAR    Pollen Extract Swelling    Grass   Bee Venom Swelling and Rash   Wasp Venom Swelling and Rash    The results of significant diagnostics from this hospitalization (including imaging, microbiology, ancillary and laboratory) are listed below for reference.    Microbiology: Recent Results (from the past 240 hour(s))  Urine Culture     Status: Abnormal   Collection Time: 03/06/22  1:06 PM   Specimen: Urine, Clean Catch  Result Value Ref Range Status   Specimen Description   Final    URINE, CLEAN CATCH Performed at [facility], [place] [address] [place], [place] [address]    Special Requests   Final    NONE Performed at [facility] [place] [address], [place], [place] [address]    Culture >=100,000 COLONIES/mL KLEBSIELLA PNEUMONIAE (A)  Final   Report Status 03/09/2022 FINAL  Final   Organism ID, Bacteria KLEBSIELLA PNEUMONIAE (A)  Final      Susceptibility   Klebsiella pneumoniae - MIC*    AMPICILLIN >=32 RESISTANT Resistant      CEFAZOLIN <=4 SENSITIVE Sensitive     CEFEPIME <=0.12 SENSITIVE Sensitive     CEFTRIAXONE <=0.25 SENSITIVE Sensitive     CIPROFLOXACIN <=0.25 SENSITIVE Sensitive     GENTAMICIN <=1 SENSITIVE Sensitive     IMIPENEM <=0.25 SENSITIVE Sensitive     NITROFURANTOIN <=16 SENSITIVE Sensitive     TRIMETH/SULFA <=20 SENSITIVE Sensitive     AMPICILLIN/SULBACTAM 4 SENSITIVE Sensitive     PIP/TAZO <=4 SENSITIVE Sensitive     * >=100,000 COLONIES/mL KLEBSIELLA PNEUMONIAE    Procedures/Studies: CT ABDOMEN PELVIS W CONTRAST  Result Date: 03/06/2022 CLINICAL DATA:  Lower GI bleed Colon cancer, staging. "[facility] for rectal bleeding. Pt has a hx on colon CA, vascular dementia " EXAM: CT ABDOMEN AND PELVIS WITH CONTRAST TECHNIQUE: Multidetector CT imaging of the abdomen and pelvis was performed using the standard protocol following bolus administration of intravenous contrast. RADIATION DOSE REDUCTION: This exam was performed according to the departmental dose-optimization program which includes automated exposure control, adjustment of the mA and/or kV according to patient size and/or use of iterative reconstruction technique. CONTRAST:  161m OMNIPAQUE IOHEXOL 300 MG/ML  SOLN COMPARISON:  CT chest abdomen pelvis 03/31/2021, CT abdomen pelvis 10/27/2018 FINDINGS: Lower chest: Mild reticulations. Status post bicuspid and tricuspid valve replacements. No acute abnormality. Hepatobiliary: No focal liver abnormality. No gallstones, gallbladder wall thickening, or pericholecystic fluid. No biliary dilatation. Pancreas: No focal lesion. Normal pancreatic contour. No surrounding inflammatory changes. No main pancreatic ductal dilatation. Spleen: Normal in size without focal abnormality. Adrenals/Urinary Tract: No adrenal nodule bilaterally. Bilateral kidneys enhance symmetrically. Fluid density lesion within the right kidney likely represents a simple renal cyst. Simple renal cysts,  in the absence of clinically indicated signs/symptoms, require no independent follow-up. No hydronephrosis. No hydroureter. The urinary bladder is unremarkable. On delayed imaging, there is no urothelial wall thickening  and there are no filling defects in the opacified portions of the bilateral collecting systems or ureters. Stomach/Bowel: Partial colectomy. Stomach is within normal limits. No evidence of bowel wall thickening or dilatation. Under distension of the rectosigmoid colon. Colonic diverticulosis. Stool throughout the colon. Vascular/Lymphatic: No abdominal aorta or iliac aneurysm. Severe atherosclerotic plaque of the aorta and its branches. No abdominal, pelvic, or inguinal lymphadenopathy. Reproductive: Status post hysterectomy. No adnexal masses. Other: No intraperitoneal free fluid. No intraperitoneal free gas. No organized fluid collection. Musculoskeletal: No abdominal wall hernia or abnormality. No suspicious lytic or blastic osseous lesions. No acute displaced fracture. Multilevel degenerative changes of the spine with grade 1 anterolisthesis of L3 on L4 and grade 2 anterolisthesis of L4 on L5. Mild retrolisthesis of T10 on T11 and T11 on T12. Multilevel intervertebral disc space narrowing. L3-L5 posterolateral fusion surgical hardware. IMPRESSION: 1. Colonic diverticulosis with no acute diverticulitis. 2. Partial colectomy. 3.  Aortic Atherosclerosis (ICD10-I70.0). Electronically Signed   By: Iven Finn M.D.   On: 03/06/2022 17:37    Labs: BNP (last 3 results) No results for input(s): "BNP" in the last 8760 hours. Basic Metabolic Panel: Recent Labs  Lab 03/06/22 1306 03/07/22 0100 03/08/22 1105 03/09/22 0711 03/10/22 0820  NA 136 133* 127* 125* 131*  K 4.3 4.6 3.9 4.2 4.2  CL 102 103 92* 91* 98  CO2 '27 22 24 26 26  '$ GLUCOSE 119* 100* 138* 99 97  BUN '23 18 12 '$ 24* 26*  CREATININE 0.78 0.68 0.52 0.83 0.75  CALCIUM 8.8* 8.7* 8.6* 8.5* 8.9   Liver Function Tests: Recent Labs   Lab 03/06/22 1306 03/07/22 0100  AST 18 22  ALT 17 16  ALKPHOS 76 66  BILITOT 0.6 0.7  PROT 6.6 5.8*  ALBUMIN 3.8 3.1*   No results for input(s): "LIPASE", "AMYLASE" in the last 168 hours. No results for input(s): "AMMONIA" in the last 168 hours. CBC: Recent Labs  Lab 03/06/22 1306 03/06/22 1930 03/07/22 0100 03/07/22 1018 03/08/22 1105 03/08/22 1826 03/09/22 0121 03/09/22 0710 03/10/22 0820  WBC 9.4  --  9.0  --  12.6*  --   --  11.4* 10.1  NEUTROABS 6.9  --   --   --   --   --   --   --   --   HGB 8.3*   < > 7.5*   < > 9.6* 12.1 10.2* 9.6* 10.1*  HCT 25.4*   < > 22.8*   < > 28.9* 35.9* 29.9* 28.6* 30.5*  MCV 104.1*  --  103.6*  --  100.3*  --   --  100.0 102.0*  PLT 193  --  189  --  177  --   --  212 222   < > = values in this interval not displayed.   Cardiac Enzymes: No results for input(s): "CKTOTAL", "CKMB", "CKMBINDEX", "TROPONINI" in the last 168 hours. BNP: Invalid input(s): "POCBNP" CBG: No results for input(s): "GLUCAP" in the last 168 hours. D-Dimer No results for input(s): "DDIMER" in the last 72 hours. Hgb A1c No results for input(s): "HGBA1C" in the last 72 hours. Lipid Profile No results for input(s): "CHOL", "HDL", "LDLCALC", "TRIG", "CHOLHDL", "LDLDIRECT" in the last 72 hours. Thyroid function studies No results for input(s): "TSH", "T4TOTAL", "T3FREE", "THYROIDAB" in the last 72 hours.  Invalid input(s): "FREET3" Anemia work up No results for input(s): "VITAMINB12", "FOLATE", "FERRITIN", "TIBC", "IRON", "RETICCTPCT" in the last 72 hours. Urinalysis    Component Value Date/Time  COLORURINE YELLOW 03/06/2022 1306   APPEARANCEUR HAZY (A) 03/06/2022 1306   LABSPEC 1.015 03/06/2022 1306   PHURINE 5.0 03/06/2022 1306   GLUCOSEU NEGATIVE 03/06/2022 1306   HGBUR NEGATIVE 03/06/2022 1306   BILIRUBINUR NEGATIVE 03/06/2022 1306   KETONESUR NEGATIVE 03/06/2022 1306   PROTEINUR NEGATIVE 03/06/2022 1306   NITRITE POSITIVE (A) 03/06/2022 1306    LEUKOCYTESUR MODERATE (A) 03/06/2022 1306   Sepsis Labs Recent Labs  Lab 03/07/22 0100 03/08/22 1105 03/09/22 0710 03/10/22 0820  WBC 9.0 12.6* 11.4* 10.1   Microbiology Recent Results (from the past 240 hour(s))  Urine Culture     Status: Abnormal   Collection Time: 03/06/22  1:06 PM   Specimen: Urine, Clean Catch  Result Value Ref Range Status   Specimen Description   Final    URINE, CLEAN CATCH Performed at [facility], [place] [address] [place], [place] [address]    Special Requests   Final    NONE Performed at [facility], [place] [address], [place], [place] [address]    Culture >=100,000 COLONIES/mL KLEBSIELLA PNEUMONIAE (A)  Final   Report Status 03/09/2022 FINAL  Final   Organism ID, Bacteria KLEBSIELLA PNEUMONIAE (A)  Final      Susceptibility   Klebsiella pneumoniae - MIC*    AMPICILLIN >=32 RESISTANT Resistant     CEFAZOLIN <=4 SENSITIVE Sensitive     CEFEPIME <=0.12 SENSITIVE Sensitive     CEFTRIAXONE <=0.25 SENSITIVE Sensitive     CIPROFLOXACIN <=0.25 SENSITIVE Sensitive     GENTAMICIN <=1 SENSITIVE Sensitive     IMIPENEM <=0.25 SENSITIVE Sensitive     NITROFURANTOIN <=16 SENSITIVE Sensitive     TRIMETH/SULFA <=20 SENSITIVE Sensitive     AMPICILLIN/SULBACTAM 4 SENSITIVE Sensitive     PIP/TAZO <=4 SENSITIVE Sensitive     * >=100,000 COLONIES/mL KLEBSIELLA PNEUMONIAE     Time coordinating discharge: 25 minutes  SIGNED: Antonieta Pert, MD  [facility] 03/10/2022, 12:02 PM  If 7PM-7AM, please contact night-coverage [email or web address]

## 2022-03-12 ENCOUNTER — Encounter: Payer: Self-pay | Admitting: Internal Medicine

## 2022-03-12 ENCOUNTER — Non-Acute Institutional Stay: Payer: Medicare Other | Admitting: Internal Medicine

## 2022-03-12 DIAGNOSIS — I10 Essential (primary) hypertension: Secondary | ICD-10-CM

## 2022-03-12 DIAGNOSIS — E039 Hypothyroidism, unspecified: Secondary | ICD-10-CM | POA: Diagnosis not present

## 2022-03-12 DIAGNOSIS — E871 Hypo-osmolality and hyponatremia: Secondary | ICD-10-CM | POA: Diagnosis not present

## 2022-03-12 DIAGNOSIS — I4821 Permanent atrial fibrillation: Secondary | ICD-10-CM

## 2022-03-12 DIAGNOSIS — K625 Hemorrhage of anus and rectum: Secondary | ICD-10-CM

## 2022-03-12 DIAGNOSIS — G3184 Mild cognitive impairment, so stated: Secondary | ICD-10-CM

## 2022-03-12 DIAGNOSIS — M06049 Rheumatoid arthritis without rheumatoid factor, unspecified hand: Secondary | ICD-10-CM

## 2022-03-12 DIAGNOSIS — D500 Iron deficiency anemia secondary to blood loss (chronic): Secondary | ICD-10-CM | POA: Diagnosis not present

## 2022-03-12 DIAGNOSIS — S82891S Other fracture of right lower leg, sequela: Secondary | ICD-10-CM

## 2022-03-12 NOTE — Progress Notes (Unsigned)
Provider:   Location:  Occupational psychologist of Service:  SNF (31)  PCP: Virgie Dad, MD Patient Care Team: Virgie Dad, MD as PCP - General (Internal Medicine) Pixie Casino, MD as PCP - Cardiology (Cardiology) Pieter Partridge, DO as Consulting Physician (Neurology)  Extended Emergency Contact Information Primary Emergency Contact: [name],[name] Address: [address]          [place], [place] [address] Johnnette Litter of [place] Phone: [phone] Mobile Phone: [phone] Relation: Daughter Secondary Emergency Contact: Eula Listen Address: [address]          [place],  [address] Johnnette Litter of [place] Mobile Phone: [phone] Relation: Son  Code Status: DNR Goals of Care: Advanced Directive information    03/12/2022   10:18 AM  Advanced Directives  Does Patient Have a Medical Advance Directive? Yes  Type of Paramedic of [place];Living will;Out of facility DNR (pink MOST or yellow form)  Does patient want to make changes to medical advance directive? No - Patient declined  Copy of [place] in Chart? Yes - validated most recent copy scanned in chart (See row information)      Chief Complaint  Patient presents with   Re Admit To SNF    HPI: Patient is a 88 y.o. female seen today for admission to SNF  Admitted in the hospital from 12/12-12/16 for Rectal Bleeding and UTI Was sent to ED for GI bleed Bleeding BRPR HGB had dropped to 7.5 CT scan showed No Acute issues with no recurrence of her Colon Cancer  Thought to be due to Diverticular bleeding. Colonoscopy deferred Bleeding stopped spontaneously No More recurrence. Hgb Stabilized Did get one unit of PRBC CEA slightly high She did get very confused in Hospital and diagnosed with UTI treated with Antibiotics Culture positive for Klebsiella Now on ANtibiotics  She also has h/o  A Fib Off Eliquis due to recurrent falls Only  on Asprin now Scalp Hematoma which has now completely healed Hypertension and HLD H/o TIA  Cerebral Amyloid angiopathy on MRI Colon Cancer in Remission  Anemia  Cognitive impairment Last MMSE in Neurology was 22/30 in 10/23 Rheumatoid arthritis Taken off embrel for immunotherapy Now only on Arava   No Complains today. Mental status at baseline Did have BM which was normal with no blood She feels weak but othersie eating Mental status at baseline No Abdominal pain no nausea or Vomiting  Past Medical History:  Diagnosis Date   Allergy    seasonal   Anemia    Anxiety    Arthritis    Back   Asymptomatic menopausal state    Atrial fibrillation ([facility])    Cancer ([facility]) 2006   Colon.  Basal Cell Skin cancer- right arm   Cataract    removed bilateral   Chronic atrial fibrillation (HCC)    Colon cancer (HCC)    Constipation due to pain medication therapy    after heart surgery   Deficiency of other specified B group vitamins    Diplopia    Disorientation, unspecified    Dysrhythmia    PAF   GERD (gastroesophageal reflux disease)    Heart murmur    Hyperlipidemia    Hypertension    Hypothyroidism    Malignant neoplasm of colon, unspecified ([facility])    Other specified diseases of blood and blood-forming organs    Other specified disorders of bone density and structure, unspecified forearm    Other thrombophilia (  [place])    per matrix   Overactive bladder    per Matrix   Personal history of other malignant neoplasm of large intestine    Presbycusis, bilateral    per matrix   RA (rheumatoid arthritis) (HCC)    Restless leg    Seizures ([facility])    after Heart Surgey   Stroke ([place])    TIA- found by neurologist after    Valgus deformity, not elsewhere classified, right knee    per matrix   Vascular dementia, mild, without behavioral disturbance, psychotic disturbance, mood disturbance, and anxiety ([place])    per matrix   Past Surgical History:  Procedure Laterality Date    ABDOMINAL HYSTERECTOMY  1970   Partial    COLON RESECTION  2006   cancer   COLON SURGERY     COLONOSCOPY     EYE SURGERY Bilateral    Cataract   MAXIMUM ACCESS (MAS)POSTERIOR LUMBAR INTERBODY FUSION (PLIF) 2 LEVEL N/A 04/12/2015   Procedure: Lumbar Three-Five Decompression, Pedicle Screw Fixation, and Posteriolateral Arthrodesis;  Surgeon: Erline Levine, MD;  Location: [facility];  Service: Neurosurgery;  Laterality: N/A;  L3-4 L4-5 Maximum access posterior lumbar fusion, possible interbodies and resection of synovial cyst at L4-5   MITRAL VALVE REPAIR  01/20/2013   Gore-tex cords to P1, P2, and P3. Magic suture to posterior medial commisure, #30 Physio 1 ring. Done in [place]   TONSILLECTOMY     about [place]  01/20/2013   #28 TriAd ring done in [place]    reports that she has never smoked. She has been exposed to tobacco smoke. She has never used smokeless tobacco. She reports current alcohol use of about 1.0 standard drink of alcohol per week. She reports that she does not use drugs. Social History   Socioeconomic History   Marital status: Widowed    Spouse name: Not on file   Number of children: Not on file   Years of education: Not on file   Highest education level: Not on file  Occupational History   Occupation: elementary school teacher    Comment: retired   Tobacco Use   Smoking status: Never    Passive exposure: Past ("mother smoked")   Smokeless tobacco: Never  Vaping Use   Vaping Use: Never used  Substance and Sexual Activity   Alcohol use: Yes    Alcohol/week: 1.0 standard drink of alcohol    Types: 1 Glasses of wine per week    Comment: 1-2 per week   Drug use: No   Sexual activity: Not Currently  Other Topics Concern   Not on file  Social History Narrative   Social History      Diet? Healthy- low salt, sugar, fat      Do you drink/eat things with caffeine? On occasion      Marital status?        widow                        What  year were you married? 1958      Do you live in a house, apartment, assisted living, condo, trailer, etc.? apartment      Is it one or more stories? one      How many persons live in your home? one      Do you have any pets in your home? (please list) no      Highest level of education completed? 4  year college      Current or past profession: elementary teacher      Do you exercise?             yes                         Type & how often? Classes- senior retirement community/ some walking      Advanced Directives      Do you have a living will?      Do you have a DNR form?                                  If not, do you want to discuss one?      Do you have signed POA/HPOA for forms?       Functional Status Completed by: Daughter, Di Kindle      Do you have difficulty bathing or dressing yourself? no      Do you have difficulty preparing food or eating? no      Do you have difficulty managing your medications? no      Do you have difficulty managing your finances? no      Do you have difficulty affording your medications? no   Right handed   [facility]   Social Determinants of Health   Financial Resource Strain: Low Risk  (01/28/2018)   Overall Financial Resource Strain (CARDIA)    Difficulty of Paying Living Expenses: Not hard at all  Food Insecurity: No Food Insecurity (03/08/2022)   Hunger Vital Sign    Worried About Running Out of Food in the Last Year: Never true    Ran Out of Food in the Last Year: Never true  Transportation Needs: No Transportation Needs (03/08/2022)   PRAPARE - Hydrologist (Medical): No    Lack of Transportation (Non-Medical): No  Physical Activity: Sufficiently Active (01/28/2018)   Exercise Vital Sign    Days of Exercise per Week: 6 days    Minutes of Exercise per Session: 30 min  Stress: No Stress Concern Present (01/28/2018)   [place]    Feeling of Stress : Only a little  Social Connections: Moderately Isolated (01/28/2018)   Social Connection and Isolation Panel [NHANES]    Frequency of Communication with Friends and Family: More than three times a week    Frequency of Social Gatherings with Friends and Family: More than three times a week    Attends Religious Services: Never    Marine scientist or Organizations: No    Attends Archivist Meetings: Never    Marital Status: Widowed  Intimate Partner Violence: Not At Risk (03/08/2022)   Humiliation, Afraid, Rape, and Kick questionnaire    Fear of Current or Ex-Partner: No    Emotionally Abused: No    Physically Abused: No    Sexually Abused: No    Functional Status Survey:    Family History  Problem Relation Age of Onset   Lung disease Mother 41   Heart disease Father 27   Colon polyps Daughter    Alcohol abuse Son    Colon cancer Neg Hx    Stomach cancer Neg Hx    Rectal cancer Neg Hx    Crohn's disease Neg Hx    Esophageal cancer Neg Hx  Health Maintenance  Topic Date Due   COVID-19 Vaccine (6 - 2023-24 season) 03/26/2022   DTaP/Tdap/Td (2 - Td or Tdap) 04/16/2027   Pneumonia Vaccine 32+ Years old  Completed   INFLUENZA VACCINE  Completed   DEXA SCAN  Completed   Zoster Vaccines- Shingrix  Completed   HPV VACCINES  Aged Out    Allergies  Allergen Reactions   Claritin [Loratadine] Other (See Comments)    Listed on MAR Unknown reaction   Codeine Other (See Comments)    "just don't take it well"   Gabapentin Other (See Comments)    Dizziness    Molds & Smuts Other (See Comments)    Dust.  Reaction is not listed on MAR    Pollen Extract Swelling    Grass   Bee Venom Swelling and Rash   Wasp Venom Swelling and Rash    Outpatient Encounter Medications as of 03/12/2022  Medication Sig   acetaminophen (TYLENOL) 500 MG tablet Take 1,000 mg by mouth 2 (two) times daily as needed for mild pain.   atorvastatin  (LIPITOR) 20 MG tablet TAKE 1 TABLET DAILY (Patient taking differently: Take 20 mg by mouth at bedtime.)   cefadroxil (DURICEF) 500 MG capsule Take 1 capsule (500 mg total) by mouth 2 (two) times daily for 4 days.   Cholecalciferol (VITAMIN D) 50 MCG (2000 UT) CAPS Take 2,000 Units by mouth in the morning.   cloNIDine (CATAPRES) 0.1 MG tablet Take 1 tablet (0.1 mg total) by mouth daily as needed. If SBP >180 only (Patient taking differently: Take 0.1 mg by mouth as needed (if SBP > 180).)   digoxin (LANOXIN) 0.125 MG tablet TAKE 1 TABLET DAILY (Patient taking differently: Take 0.125 mg by mouth daily.)   hydrocortisone (ANUSOL-HC) 25 MG suppository Place 1 suppository (25 mg total) rectally daily.   Iron, Ferrous Sulfate, 325 (65 Fe) MG TABS Take 325 mg by mouth daily.   leflunomide (ARAVA) 20 MG tablet Take 20 mg by mouth in the morning.   levalbuterol (XOPENEX) 0.63 MG/3ML nebulizer solution Take 3 mLs (0.63 mg total) by nebulization in the morning and at bedtime. (Patient taking differently: Take 0.63 mg by nebulization every 6 (six) hours as needed for wheezing (cough).)   levothyroxine (SYNTHROID) 100 MCG tablet Take 1 tablet (100 mcg total) by mouth daily before breakfast.   melatonin 3 MG TABS tablet Take 1 tablet (3 mg total) by mouth at bedtime as needed.   metoprolol succinate (TOPROL-XL) 50 MG 24 hr tablet TAKE 1 TABLET DAILY (Patient taking differently: Take 50 mg by mouth daily.)   mirtazapine (REMERON) 15 MG tablet TAKE 1 TABLET AT BEDTIME (Patient taking differently: Take 15 mg by mouth at bedtime.)   Multiple Vitamin (MULTIVITAMIN) tablet Take 1 tablet by mouth in the morning.   Multiple Vitamins-Minerals (PRESERVISION AREDS 2) CAPS Take 1 capsule by mouth 2 (two) times daily.   NON FORMULARY Fluid restriction 1800cc daily  1st- 900 cc 2nd- 700 cc 3rd- 200 cc   omeprazole (PRILOSEC) 40 MG capsule TAKE 1 CAPSULE DAILY (Patient taking differently: Take 40 mg by mouth daily. Take 30  minutes before breakfast on a empty stomach)   ondansetron (ZOFRAN) 4 MG tablet Take 4 mg by mouth every 6 (six) hours as needed for nausea or vomiting.   sodium chloride 1 g tablet Take 1 tablet (1 g total) by mouth 2 (two) times daily with a meal.   valsartan (DIOVAN) 80 MG tablet Take 60 mg  by mouth at bedtime. Increase valsartan to 60 mg by mouth daily and report if BP is not consistently <150/90 per MAR   Vibegron (GEMTESA) 75 MG TABS Take 32.5 mg by mouth at bedtime.   vitamin B-12 (CYANOCOBALAMIN) 1000 MCG tablet Take 1,000 mcg by mouth in the morning.   [DISCONTINUED] PRESCRIPTION MEDICATION Take 1 g by mouth daily. Sodium 1 g   [DISCONTINUED] shark liver oil-cocoa butter (PREPARATION H) 0.25-3-85.5 % suppository Place 1 suppository rectally at bedtime. For 7 days starting 03/02/22   No facility-administered encounter medications on file as of 03/12/2022.    Review of Systems  Constitutional:  Positive for activity change. Negative for appetite change.  HENT: Negative.    Respiratory:  Negative for cough and shortness of breath.   Cardiovascular:  Negative for leg swelling.  Gastrointestinal:  Negative for blood in stool and constipation.  Genitourinary: Negative.   Musculoskeletal:  Positive for gait problem. Negative for arthralgias and myalgias.  Skin: Negative.   Neurological:  Positive for dizziness and weakness.  Psychiatric/Behavioral:  Positive for confusion. Negative for dysphoric mood and sleep disturbance.     Vitals:   03/12/22 1010  BP: (!) 156/77  Pulse: 98  Resp: 20  Temp: 99.1 F (37.3 C)  TempSrc: Temporal  SpO2: 97%  Weight: 127 lb (57.6 kg)  Height: '5\' 2"'$  (1.575 m)   Body mass index is 23.23 kg/m. Physical Exam Vitals reviewed.  Constitutional:      Appearance: Normal appearance.  HENT:     Head: Normocephalic.     Nose: Nose normal.     Mouth/Throat:     Mouth: Mucous membranes are moist.     Pharynx: Oropharynx is clear.  Eyes:      Pupils: Pupils are equal, round, and reactive to light.  Cardiovascular:     Rate and Rhythm: Normal rate and regular rhythm.     Pulses: Normal pulses.     Heart sounds: Normal heart sounds. No murmur heard. Pulmonary:     Effort: Pulmonary effort is normal.     Breath sounds: Normal breath sounds.  Abdominal:     General: Abdomen is flat. Bowel sounds are normal.     Palpations: Abdomen is soft.  Musculoskeletal:        General: No swelling.     Cervical back: Neck supple.  Skin:    General: Skin is warm.  Neurological:     General: No focal deficit present.     Mental Status: She is alert.  Psychiatric:        Mood and Affect: Mood normal.        Thought Content: Thought content normal.     Labs reviewed: Basic Metabolic Panel: Recent Labs    12/07/21 0548 12/20/21 2238 01/30/22 1809 02/03/22 0000 03/08/22 1105 03/09/22 0711 03/10/22 0820  NA 137   < > 132*   < > 127* 125* 131*  K 4.2   < > 4.3   < > 3.9 4.2 4.2  CL 104   < > 100   < > 92* 91* 98  CO2 23   < > 22   < > '24 26 26  '$ GLUCOSE 90   < > 116*   < > 138* 99 97  BUN 15   < > 22   < > 12 24* 26*  CREATININE 0.79   < > 0.88   < > 0.52 0.83 0.75  CALCIUM 9.1   < > 9.4   < >  8.6* 8.5* 8.9  MG 1.6*  --  1.9  --   --   --   --    < > = values in this interval not displayed.   Liver Function Tests: Recent Labs    10/03/21 1055 03/06/22 1306 03/07/22 0100  AST '22 18 22  '$ ALT '18 17 16  '$ ALKPHOS 75 76 66  BILITOT 0.8 0.6 0.7  PROT 7.1 6.6 5.8*  ALBUMIN 4.5 3.8 3.1*   No results for input(s): "LIPASE", "AMYLASE" in the last 8760 hours. No results for input(s): "AMMONIA" in the last 8760 hours. CBC: Recent Labs    01/04/22 0937 01/06/22 0000 01/30/22 1809 02/03/22 0000 03/06/22 1306 03/06/22 1930 03/08/22 1105 03/08/22 1826 03/09/22 0121 03/09/22 0710 03/10/22 0820  WBC 8.1   < > 7.6   < > 9.4   < > 12.6*  --   --  11.4* 10.1  NEUTROABS 4.7  --  4.9  --  6.9  --   --   --   --   --   --   HGB  9.7*   < > 11.2*   < > 8.3*   < > 9.6*   < > 10.2* 9.6* 10.1*  HCT 29.8*   < > 33.8*   < > 25.4*   < > 28.9*   < > 29.9* 28.6* 30.5*  MCV 111.2*  --  106.6*  --  104.1*   < > 100.3*  --   --  100.0 102.0*  PLT 258  --  194   < > 193   < > 177  --   --  212 222   < > = values in this interval not displayed.   Cardiac Enzymes: No results for input(s): "CKTOTAL", "CKMB", "CKMBINDEX", "TROPONINI" in the last 8760 hours. BNP: Invalid input(s): "POCBNP" Lab Results  Component Value Date   HGBA1C 5.5 01/12/2013   Lab Results  Component Value Date   TSH 22.70 (A) 01/06/2022   Lab Results  Component Value Date   BJYNWGNF62 130 12/06/2021   Lab Results  Component Value Date   FOLATE 10.2 12/06/2021   Lab Results  Component Value Date   IRON 73 12/06/2021   TIBC 248 (L) 12/06/2021   FERRITIN 26 05/16/2021    Imaging and Procedures obtained prior to SNF admission: CT ABDOMEN PELVIS W CONTRAST  Result Date: 03/06/2022 CLINICAL DATA:  Lower GI bleed Colon cancer, staging. "[facility] bleeding. Pt has a hx on colon CA, vascular dementia " EXAM: CT ABDOMEN AND PELVIS WITH CONTRAST TECHNIQUE: Multidetector CT imaging of the abdomen and pelvis was performed using the standard protocol following bolus administration of intravenous contrast. RADIATION DOSE REDUCTION: This exam was performed according to the departmental dose-optimization program which includes automated exposure control, adjustment of the mA and/or kV according to patient size and/or use of iterative reconstruction technique. CONTRAST:  167m OMNIPAQUE IOHEXOL 300 MG/ML  SOLN COMPARISON:  CT chest abdomen pelvis 03/31/2021, CT abdomen pelvis 10/27/2018 FINDINGS: Lower chest: Mild reticulations. Status post bicuspid and tricuspid valve replacements. No acute abnormality. Hepatobiliary: No focal liver abnormality. No gallstones, gallbladder wall thickening, or pericholecystic fluid.  No biliary dilatation. Pancreas: No focal lesion. Normal pancreatic contour. No surrounding inflammatory changes. No main pancreatic ductal dilatation. Spleen: Normal in size without focal abnormality. Adrenals/Urinary Tract: No adrenal nodule bilaterally. Bilateral kidneys enhance symmetrically. Fluid density lesion within the right kidney likely represents a simple  renal cyst. Simple renal cysts, in the absence of clinically indicated signs/symptoms, require no independent follow-up. No hydronephrosis. No hydroureter. The urinary bladder is unremarkable. On delayed imaging, there is no urothelial wall thickening and there are no filling defects in the opacified portions of the bilateral collecting systems or ureters. Stomach/Bowel: Partial colectomy. Stomach is within normal limits. No evidence of bowel wall thickening or dilatation. Under distension of the rectosigmoid colon. Colonic diverticulosis. Stool throughout the colon. Vascular/Lymphatic: No abdominal aorta or iliac aneurysm. Severe atherosclerotic plaque of the aorta and its branches. No abdominal, pelvic, or inguinal lymphadenopathy. Reproductive: Status post hysterectomy. No adnexal masses. Other: No intraperitoneal free fluid. No intraperitoneal free gas. No organized fluid collection. Musculoskeletal: No abdominal wall hernia or abnormality. No suspicious lytic or blastic osseous lesions. No acute displaced fracture. Multilevel degenerative changes of the spine with grade 1 anterolisthesis of L3 on L4 and grade 2 anterolisthesis of L4 on L5. Mild retrolisthesis of T10 on T11 and T11 on T12. Multilevel intervertebral disc space narrowing. L3-L5 posterolateral fusion surgical hardware. IMPRESSION: 1. Colonic diverticulosis with no acute diverticulitis. 2. Partial colectomy. 3.  Aortic Atherosclerosis (ICD10-I70.0). Electronically Signed   By: Iven Finn M.D.   On: 03/06/2022 17:37    Assessment/Plan 1. Rectal bleeding Most likely  Diverticular CT negative for any Cancer recurrence Spontaneously resolved Continue to monitor Colonoscopy deferred for now CEA slightly high CBC Pending 2. Iron deficiency anemia due to chronic blood loss On Iron CBC pending S/p PRBC in hospital  3. Closed fracture of right ankle, sequela NWB  Follow up with Ortho  4. Permanent atrial fibrillation (HCC) Off Anticoagulation due to bleeding risk On Metoprolol and Digoxin 5. Hyponatremia Change Salt tablets to QD now that she is eating well Repeat BMP   6. Essential hypertension On Diovan  7. Hypothyroidism, unspecified type Repeat TSH  8. Rheumatoid arthritis involving hand with negative rheumatoid factor, unspecified laterality ([place]) On ARava  9. Mild cognitive impairment with memory loss Mental status baseline 10 Urinary Incontinence On Gemtesa    Family/ staff Communication:   Labs/tests ordered:

## 2022-03-15 DIAGNOSIS — D649 Anemia, unspecified: Secondary | ICD-10-CM | POA: Diagnosis not present

## 2022-03-15 LAB — COMPREHENSIVE METABOLIC PANEL
Calcium: 8.8 (ref 8.7–10.7)
eGFR: 81

## 2022-03-15 LAB — BASIC METABOLIC PANEL
BUN: 16 (ref 4–21)
CO2: 23 — AB (ref 13–22)
Chloride: 100 (ref 99–108)
Creatinine: 0.7 (ref 0.5–1.1)
Glucose: 95
Potassium: 4.7 mEq/L (ref 3.5–5.1)
Sodium: 132 — AB (ref 137–147)

## 2022-03-15 LAB — CBC AND DIFFERENTIAL
HCT: 28 — AB (ref 36–46)
Hemoglobin: 9.3 — AB (ref 12.0–16.0)
Platelets: 246 10*3/uL (ref 150–400)
WBC: 6.2

## 2022-03-15 LAB — TSH: TSH: 6.07 — AB (ref 0.41–5.90)

## 2022-03-15 LAB — CBC: RBC: 2.73 — AB (ref 3.87–5.11)

## 2022-03-21 NOTE — Progress Notes (Unsigned)
   This service is provided via telemedicine  No vital signs collected/recorded due to the encounter was a telemedicine visit.   Location of patient (ex: home, work):  Home  Patient consents to a telephone visit: Yes, see telephone visit dated .03/22/2022  Location of the provider (ex: office, home): [facility], Remote Location   Name of any referring provider:  N/A  Names of all persons participating in the telemedicine service and their role in the encounter:  S.Chrae B/CMA, Sherrie Mustache, NP, and Patient   Time spent on call:  12 min with medical assistant

## 2022-03-22 ENCOUNTER — Encounter: Payer: Self-pay | Admitting: Adult Health

## 2022-03-22 ENCOUNTER — Non-Acute Institutional Stay (INDEPENDENT_AMBULATORY_CARE_PROVIDER_SITE_OTHER): Payer: Medicare Other | Admitting: Adult Health

## 2022-03-22 ENCOUNTER — Encounter: Payer: Medicare Other | Admitting: Nurse Practitioner

## 2022-03-22 DIAGNOSIS — Z0000 Encounter for general adult medical examination without abnormal findings: Secondary | ICD-10-CM | POA: Diagnosis not present

## 2022-03-22 DIAGNOSIS — Z66 Do not resuscitate: Secondary | ICD-10-CM

## 2022-03-22 DIAGNOSIS — S82841A Displaced bimalleolar fracture of right lower leg, initial encounter for closed fracture: Secondary | ICD-10-CM | POA: Diagnosis not present

## 2022-03-22 NOTE — Patient Instructions (Signed)
Kathy Velez , Thank you for taking time to come for your Medicare Wellness Visit. I appreciate your ongoing commitment to your health goals. Please review the following plan we discussed and let me know if I can assist you in the future.   Screening recommendations/referrals: Colonoscopy aged out for screening, done periodic per GI Mammogram aged out Bone Density Due 2023 Recommended yearly ophthalmology/optometry visit for glaucoma screening and checkup Recommended yearly dental visit for hygiene and checkup  Vaccinations: Influenza vaccine- due annually in September/October Pneumococcal vaccine up to date Tdap vaccine up to date Shingles vaccine up to date    Advanced directives: reviewed   Conditions/risks identified: fall risk  Next appointment: 1 year   Preventive Care 82 Years and Older, Female Preventive care refers to lifestyle choices and visits with your health care provider that can promote health and wellness. What does preventive care include? A yearly physical exam. This is also called an annual well check. Dental exams once or twice a year. Routine eye exams. Ask your health care provider how often you should have your eyes checked. Personal lifestyle choices, including: Daily care of your teeth and gums. Regular physical activity. Eating a healthy diet. Avoiding tobacco and drug use. Limiting alcohol use. Practicing safe sex. Taking low-dose aspirin every day. Taking vitamin and mineral supplements as recommended by your health care provider. What happens during an annual well check? The services and screenings done by your health care provider during your annual well check will depend on your age, overall health, lifestyle risk factors, and family history of disease. Counseling  Your health care provider may ask you questions about your: Alcohol use. Tobacco use. Drug use. Emotional well-being. Home and relationship well-being. Sexual activity. Eating  habits. History of falls. Memory and ability to understand (cognition). Work and work Statistician. Reproductive health. Screening  You may have the following tests or measurements: Height, weight, and BMI. Blood pressure. Lipid and cholesterol levels. These may be checked every 5 years, or more frequently if you are over 35 years old. Skin check. Lung cancer screening. You may have this screening every year starting at age 74 if you have a 30-pack-year history of smoking and currently smoke or have quit within the past 15 years. Fecal occult blood test (FOBT) of the stool. You may have this test every year starting at age 25. Flexible sigmoidoscopy or colonoscopy. You may have a sigmoidoscopy every 5 years or a colonoscopy every 10 years starting at age 27. Hepatitis C blood test. Hepatitis B blood test. Sexually transmitted disease (STD) testing. Diabetes screening. This is done by checking your blood sugar (glucose) after you have not eaten for a while (fasting). You may have this done every 1-3 years. Bone density scan. This is done to screen for osteoporosis. You may have this done starting at age 32. Mammogram. This may be done every 1-2 years. Talk to your health care provider about how often you should have regular mammograms. Talk with your health care provider about your test results, treatment options, and if necessary, the need for more tests. Vaccines  Your health care provider may recommend certain vaccines, such as: Influenza vaccine. This is recommended every year. Tetanus, diphtheria, and acellular pertussis (Tdap, Td) vaccine. You may need a Td booster every 10 years. Zoster vaccine. You may need this after age 58. Pneumococcal 13-valent conjugate (PCV13) vaccine. One dose is recommended after age 21. Pneumococcal polysaccharide (PPSV23) vaccine. One dose is recommended after age 10. Talk to  your health care provider about which screenings and vaccines you need and how  often you need them. This information is not intended to replace advice given to you by your health care provider. Make sure you discuss any questions you have with your health care provider. Document Released: 04/08/2015 Document Revised: 11/30/2015 Document Reviewed: 01/11/2015 Elsevier Interactive Patient Education  2017 [employer] Prevention in the Home Falls can cause injuries. They can happen to people of all ages. There are many things you can do to make your home safe and to help prevent falls. What can I do on the outside of my home? Regularly fix the edges of walkways and driveways and fix any cracks. Remove anything that might make you trip as you walk through a door, such as a raised step or threshold. Trim any bushes or trees on the path to your home. Use bright outdoor lighting. Clear any walking paths of anything that might make someone trip, such as rocks or tools. Regularly check to see if handrails are loose or broken. Make sure that both sides of any steps have handrails. Any raised decks and porches should have guardrails on the edges. Have any leaves, snow, or ice cleared regularly. Use sand or salt on walking paths during winter. Clean up any spills in your garage right away. This includes oil or grease spills. What can I do in the bathroom? Use night lights. Install grab bars by the toilet and in the tub and shower. Do not use towel bars as grab bars. Use non-skid mats or decals in the tub or shower. If you need to sit down in the shower, use a plastic, non-slip stool. Keep the floor dry. Clean up any water that spills on the floor as soon as it happens. Remove soap buildup in the tub or shower regularly. Attach bath mats securely with double-sided non-slip rug tape. Do not have throw rugs and other things on the floor that can make you trip. What can I do in the bedroom? Use night lights. Make sure that you have a light by your bed that is easy to  reach. Do not use any sheets or blankets that are too big for your bed. They should not hang down onto the floor. Have a firm chair that has side arms. You can use this for support while you get dressed. Do not have throw rugs and other things on the floor that can make you trip. What can I do in the kitchen? Clean up any spills right away. Avoid walking on wet floors. Keep items that you use a lot in easy-to-reach places. If you need to reach something above you, use a strong step stool that has a grab bar. Keep electrical cords out of the way. Do not use floor polish or wax that makes floors slippery. If you must use wax, use non-skid floor wax. Do not have throw rugs and other things on the floor that can make you trip. What can I do with my stairs? Do not leave any items on the stairs. Make sure that there are handrails on both sides of the stairs and use them. Fix handrails that are broken or loose. Make sure that handrails are as long as the stairways. Check any carpeting to make sure that it is firmly attached to the stairs. Fix any carpet that is loose or worn. Avoid having throw rugs at the top or bottom of the stairs. If you do have throw rugs, attach  them to the floor with carpet tape. Make sure that you have a light switch at the top of the stairs and the bottom of the stairs. If you do not have them, ask someone to add them for you. What else can I do to help prevent falls? Wear shoes that: Do not have high heels. Have rubber bottoms. Are comfortable and fit you well. Are closed at the toe. Do not wear sandals. If you use a stepladder: Make sure that it is fully opened. Do not climb a closed stepladder. Make sure that both sides of the stepladder are locked into place. Ask someone to hold it for you, if possible. Clearly mark and make sure that you can see: Any grab bars or handrails. First and last steps. Where the edge of each step is. Use tools that help you move  around (mobility aids) if they are needed. These include: Canes. Walkers. Scooters. Crutches. Turn on the lights when you go into a dark area. Replace any light bulbs as soon as they burn out. Set up your furniture so you have a clear path. Avoid moving your furniture around. If any of your floors are uneven, fix them. If there are any pets around you, be aware of where they are. Review your medicines with your doctor. Some medicines can make you feel dizzy. This can increase your chance of falling. Ask your doctor what other things that you can do to help prevent falls. This information is not intended to replace advice given to you by your health care provider. Make sure you discuss any questions you have with your health care provider. Document Released: 01/06/2009 Document Revised: 08/18/2015 Document Reviewed: 04/16/2014 Elsevier Interactive Patient Education  2017 [employer].

## 2022-03-22 NOTE — Progress Notes (Addendum)
Subjective:   Kataya Guimont is a 88 y.o. female who presents for Medicare Annual (Subsequent) preventive examination at [facility] in the skilled care section.   Review of Systems     Cardiac Risk Factors include: advanced age (>38mn, >>38women);hypertension     Objective:    Today's Vitals   03/22/22 1136  Weight: 125 lb (56.7 kg)  PainSc: 0-No pain   Body mass index is 22.86 kg/m.     03/22/2022   11:43 AM 03/21/2022    4:05 PM 03/12/2022   10:18 AM 03/08/2022    9:00 AM 03/02/2022   10:15 AM 02/06/2022   10:06 AM 01/04/2022   10:00 AM  Advanced Directives  Does Patient Have a Medical Advance Directive? Yes Yes Yes Yes Yes Yes Yes  Type of AParamedicof ATroyOut of facility DNR (pink MOST or yellow form);Living will HH[place]Living will;Out of facility DNR (pink MOST or yellow form) HM[place]otaLiving will;Out of facility DNR (pink MOST or yellow form) HSeven HillsLiving will;Out of facility DNR (pink MOST or yellow form) HC[place]Out of facility DNR (pink MOST or yellow form);Living will HO[place]Living will;Out of facility DNR (pink MOST or yellow form) HR[place]Living will  Does patient want to make changes to medical advance directive?  No - Patient declined No - Patient declined No - Patient declined No - Patient declined No - Patient declined   Copy of [place] Chart? Yes - validated most recent copy scanned in chart (See row information) Yes - validated most recent copy scanned in chart (See row information) Yes - validated most recent copy scanned in chart (See row information) Yes - validated most recent copy scanned in chart (See row information) Yes - validated most recent copy scanned in chart (See row information) Yes - validated most recent copy scanned in chart (See row information) Yes - validated most  recent copy scanned in chart (See row information)  Pre-existing out of facility DNR order (yellow form or pink MOST form) Yellow form placed in chart (order not valid for inpatient use)          Current Medications (verified) Outpatient Encounter Medications as of 03/22/2022  Medication Sig   acetaminophen (TYLENOL) 500 MG tablet Take 1,000 mg by mouth 2 (two) times daily as needed for mild pain.   atorvastatin (LIPITOR) 20 MG tablet TAKE 1 TABLET DAILY (Patient taking differently: Take 20 mg by mouth at bedtime.)   Cholecalciferol (VITAMIN D) 50 MCG (2000 UT) CAPS Take 2,000 Units by mouth in the morning.   cloNIDine (CATAPRES) 0.1 MG tablet Take 1 tablet (0.1 mg total) by mouth daily as needed. If SBP >180 only (Patient taking differently: Take 0.1 mg by mouth as needed (if SBP > 180).)   digoxin (LANOXIN) 0.125 MG tablet TAKE 1 TABLET DAILY (Patient taking differently: Take 0.125 mg by mouth daily.)   hydrocortisone (ANUSOL-HC) 25 MG suppository Place 1 suppository (25 mg total) rectally daily.   Iron, Ferrous Sulfate, 325 (65 Fe) MG TABS Take 325 mg by mouth daily.   leflunomide (ARAVA) 20 MG tablet Take 20 mg by mouth in the morning.   levalbuterol (XOPENEX) 0.63 MG/3ML nebulizer solution Take 3 mLs (0.63 mg total) by nebulization in the morning and at bedtime. (Patient taking differently: Take 0.63 mg by nebulization every 6 (six) hours as needed for wheezing (cough).)   levothyroxine (SYNTHROID)  100 MCG tablet Take 1 tablet (100 mcg total) by mouth daily before breakfast.   melatonin 3 MG TABS tablet Take 1 tablet (3 mg total) by mouth at bedtime as needed.   metoprolol succinate (TOPROL-XL) 50 MG 24 hr tablet TAKE 1 TABLET DAILY (Patient taking differently: Take 50 mg by mouth daily.)   mirtazapine (REMERON) 15 MG tablet TAKE 1 TABLET AT BEDTIME (Patient taking differently: Take 15 mg by mouth at bedtime.)   Multiple Vitamin (MULTIVITAMIN) tablet Take 1 tablet by mouth in the morning.    Multiple Vitamins-Minerals (PRESERVISION AREDS 2) CAPS Take 1 capsule by mouth 2 (two) times daily.   NON FORMULARY Fluid restriction 1800cc daily  1st- 900 cc 2nd- 700 cc 3rd- 200 cc   omeprazole (PRILOSEC) 40 MG capsule TAKE 1 CAPSULE DAILY (Patient taking differently: Take 40 mg by mouth daily. Take 30 minutes before breakfast on a empty stomach)   ondansetron (ZOFRAN) 4 MG tablet Take 4 mg by mouth every 6 (six) hours as needed for nausea or vomiting.   sodium chloride 1 g tablet Take 1 tablet (1 g total) by mouth 2 (two) times daily with a meal. (Patient taking differently: Take 1 g by mouth once.)   valsartan (DIOVAN) 80 MG tablet Take 60 mg by mouth at bedtime. Increase valsartan to 60 mg by mouth daily and report if BP is not consistently <150/90 per MAR   Vibegron (GEMTESA) 75 MG TABS Take 32.5 mg by mouth at bedtime.   vitamin B-12 (CYANOCOBALAMIN) 1000 MCG tablet Take 1,000 mcg by mouth in the morning.   No facility-administered encounter medications on file as of 03/22/2022.    Allergies (verified) Claritin [loratadine], Codeine, Gabapentin, Molds & smuts, Pollen extract, Bee venom, and Wasp venom   History: Past Medical History:  Diagnosis Date   Allergy    seasonal   Anemia    Anxiety    Arthritis    Back   Asymptomatic menopausal state    Atrial fibrillation ([place])    Cancer ([place]) 2006   Colon.  Basal Cell Skin cancer- right arm   Cataract    removed bilateral   Chronic atrial fibrillation (HCC)    Colon cancer (HCC)    Constipation due to pain medication therapy    after heart surgery   Deficiency of other specified B group vitamins    Diplopia    Disorientation, unspecified    Dysrhythmia    PAF   GERD (gastroesophageal reflux disease)    Heart murmur    Hyperlipidemia    Hypertension    Hypothyroidism    Malignant neoplasm of colon, unspecified ([place])    Other specified diseases of blood and blood-forming organs    Other specified disorders of bone  density and structure, unspecified forearm    Other thrombophilia ([place])    per matrix   Overactive bladder    per Matrix   Personal history of other malignant neoplasm of large intestine    Presbycusis, bilateral    per matrix   RA (rheumatoid arthritis) ([place])    Restless leg    Seizures ([place])    after Heart Surgey   Stroke ([place])    TIA- found by neurologist after    Valgus deformity, not elsewhere classified, right knee    per matrix   Vascular dementia, mild, without behavioral disturbance, psychotic disturbance, mood disturbance, and anxiety ([place])    per matrix   Past Surgical History:  Procedure Laterality Date   ABDOMINAL  HYSTERECTOMY  1970   Partial    COLON RESECTION  2006   cancer   COLON SURGERY     COLONOSCOPY     EYE SURGERY Bilateral    Cataract   MAXIMUM ACCESS (MAS)POSTERIOR LUMBAR INTERBODY FUSION (PLIF) 2 LEVEL N/A 04/12/2015   Procedure: Lumbar Three-Five Decompression, Pedicle Screw Fixation, and Posteriolateral Arthrodesis;  Surgeon: Erline Levine, MD;  Location: [facility];  Service: Neurosurgery;  Laterality: N/A;  L3-4 L4-5 Maximum access posterior lumbar fusion, possible interbodies and resection of synovial cyst at L4-5   MITRAL VALVE REPAIR  01/20/2013   Gore-tex cords to P1, P2, and P3. Magic suture to posterior medial commisure, #30 Physio 1 ring. Done in [place]   TONSILLECTOMY     about [place]  01/20/2013   #28 TriAd ring done in [place]   Family History  Problem Relation Age of Onset   Lung disease Mother 27   Heart disease Father 53   Colon polyps Daughter    Alcohol abuse Son    Colon cancer Neg Hx    Stomach cancer Neg Hx    Rectal cancer Neg Hx    Crohn's disease Neg Hx    Esophageal cancer Neg Hx    Social History   Socioeconomic History   Marital status: Widowed    Spouse name: Not on file   Number of children: Not on file   Years of education: Not on file   Highest education level: Not on file   Occupational History   Occupation: elementary school teacher    Comment: retired   Tobacco Use   Smoking status: Never    Passive exposure: Past ("mother smoked")   Smokeless tobacco: Never  Vaping Use   Vaping Use: Never used  Substance and Sexual Activity   Alcohol use: Yes    Alcohol/week: 1.0 standard drink of alcohol    Types: 1 Glasses of wine per week    Comment: 1-2 per week   Drug use: No   Sexual activity: Not Currently  Other Topics Concern   Not on file  Social History Narrative   Social History      Diet? Healthy- low salt, sugar, fat      Do you drink/eat things with caffeine? On occasion      Marital status?        widow                        What year were you married? 1958      Do you live in a house, apartment, assisted living, condo, trailer, etc.? apartment      Is it one or more stories? one      How many persons live in your home? one      Do you have any pets in your home? (please list) no      Highest level of education completed? 4 year college      Current or past profession: Statistician      Do you exercise?             yes                         Type & how often? Classes- senior retirement community/ some walking      Advanced Directives      Do you have a living will?  Do you have a DNR form?                                  If not, do you want to discuss one?      Do you have signed POA/HPOA for forms?       Functional Status Completed by: Daughter, Di Kindle      Do you have difficulty bathing or dressing yourself? no      Do you have difficulty preparing food or eating? no      Do you have difficulty managing your medications? no      Do you have difficulty managing your finances? no      Do you have difficulty affording your medications? no   Right handed   At [facility]   Social Determinants of Health   Financial Resource Strain: Low Risk  (01/28/2018)   Overall Financial Resource Strain (CARDIA)     Difficulty of Paying Living Expenses: Not hard at all  Food Insecurity: No Food Insecurity (03/08/2022)   Hunger Vital Sign    Worried About Running Out of Food in the Last Year: Never true    Ran Out of Food in the Last Year: Never true  Transportation Needs: No Transportation Needs (03/08/2022)   PRAPARE - Hydrologist (Medical): No    Lack of Transportation (Non-Medical): No  Physical Activity: Sufficiently Active (01/28/2018)   Exercise Vital Sign    Days of Exercise per Week: 6 days    Minutes of Exercise per Session: 30 min  Stress: No Stress Concern Present (01/28/2018)   [place]    Feeling of Stress : Only a little  Social Connections: Moderately Isolated (01/28/2018)   Social Connection and Isolation Panel [NHANES]    Frequency of Communication with Friends and Family: More than three times a week    Frequency of Social Gatherings with Friends and Family: More than three times a week    Attends Religious Services: Never    Marine scientist or Organizations: No    Attends Archivist Meetings: Never    Marital Status: Widowed    Tobacco Counseling Counseling given: Not Answered   Clinical Intake:  Pre-visit preparation completed: No  Pain : No/denies pain Pain Score: 0-No pain     BMI - recorded: 22.8 Diabetes: No  How often do you need to have someone help you when you read instructions, pamphlets, or other written materials from your doctor or pharmacy?: 4 - Often What is the last grade level you completed in school?: 4 year college  Diabetic?no  Interpreter Needed?: No  Information entered by :: Royal Hawthorn NP   Activities of Daily Living    03/22/2022   11:46 AM 03/08/2022    9:00 AM  In your present state of health, do you have any difficulty performing the following activities:  Hearing? 1 1  Vision? 1 1  Difficulty concentrating  or making decisions? 1 1  Walking or climbing stairs?  1  Dressing or bathing?  0  Doing errands, shopping?  1  Preparing Food and eating ? Y   Using the Toilet? Y   In the past six months, have you accidently leaked urine? Y   Do you have problems with loss of bowel control? Y   Managing your Medications? Y   Managing your  Finances? Y   Housekeeping or managing your Housekeeping? Y     Patient Care Team: Virgie Dad, MD as PCP - General (Internal Medicine) Debara Pickett Nadean Corwin, MD as PCP - Cardiology (Cardiology) Pieter Partridge, DO as Consulting Physician (Neurology)  Indicate any recent Medical Services you may have received from other than [facility] providers in the past year (date may be approximate).     Assessment:   This is a routine wellness examination for Akilah.  Hearing/Vision screen No results found.  Dietary issues and exercise activities discussed: Current Exercise Habits: Structured exercise class, Exercise limited by: neurologic condition(s);cardiac condition(s);orthopedic condition(s)   Goals Addressed             This Visit's Progress    Mobility and Independence Optimized       Evidence-based guidance:  Refer to physical therapy or occupational therapy for assessment and individualized program.  Provide therapy that may include functional task training, balance, active or passive exercise, spasticity management, assistive device training, cardiorespiratory fitness, Pilates and telerehabilitation services.  Identify barriers to participation in therapy or exercise such as pain with activity, anticipated or imagined pain, transportation, depression or fear.  Work with patient and family to develop self-management plan to remove barriers.  Refer to speech/language pathologist to assess and treat speech/language deficit and swallowing impairment.  Periodically review ability to perform activities of daily living and required amount of assistance needed for safety,  optimal independence and self-care.  Encourage appropriate vocational or educational counseling for re-entering the community, workplace or school; consider a driving evaluation.  Assist patient to advocate for necessary adaptations to the work or school environment.  Refer to employer for information about FMLA (Family and Medical Leave Act), Short or Long-Term Disability and options for adjustments to work schedule, assignment or hours.   Notes:        Depression Screen    03/22/2022   11:45 AM 02/07/2021    9:58 AM 02/05/2020    3:47 PM 08/05/2019    2:42 PM 05/29/2019    2:05 PM 03/09/2019   11:44 AM 02/02/2019    8:35 AM  PHQ 2/9 Scores  PHQ - 2 Score 0 0 0 0 0 0 0    Fall Risk    03/22/2022   11:45 AM 01/01/2022   11:05 AM 08/04/2021   11:33 AM 08/02/2021    8:33 AM 04/24/2021    2:47 PM  Fall Risk   Falls in the past year? 1 1 0 0 1  Number falls in past yr: 1 1 0 0 0  Injury with Fall? 1 1 0 0 0  Risk for fall due to : History of fall(s)  Impaired balance/gait Impaired balance/gait Impaired mobility  Follow up Falls evaluation completed  Falls evaluation completed Falls evaluation completed Falls evaluation completed    FALL RISK PREVENTION PERTAINING TO THE HOME:  Any stairs in or around the home? No  If so, are there any without handrails? No  Home free of loose throw rugs in walkways, pet beds, electrical cords, etc? Yes  Adequate lighting in your home to reduce risk of falls? Yes   ASSISTIVE DEVICES UTILIZED TO PREVENT FALLS:  Life alert? Yes  Use of a cane, walker or w/c? Yes  Grab bars in the bathroom? Yes  Shower chair or bench in shower? Yes  Elevated toilet seat or a handicapped toilet? Yes   TIMED UP AND GO:  Was the test performed? No .  Length  of time to ambulate 10 feet: na sec.   Gait unsteady without use of assistive device, provider informed and interventions were implemented  Cognitive Function:    03/22/2022   12:27 PM 07/13/2019     1:58 PM 01/28/2018    1:52 PM  MMSE - Mini Mental State Exam  Orientation to time '4 5 5  '$ Orientation to Place '5 5 5  '$ Registration '3 3 3  '$ Attention/ Calculation '3 5 5  '$ Recall '1 3 3  '$ Language- name 2 objects '2 2 2  '$ Language- repeat '1 1 1  '$ Language- follow 3 step command '3 3 3  '$ Language- read & follow direction '1 1 1  '$ Write a sentence '1 1 1  '$ Copy design '1 1 1  '$ Total score '25 30 30        '$ 02/07/2021   10:04 AM 02/05/2020    3:49 PM 02/02/2019    8:47 AM  6CIT Screen  What Year? 0 points 0 points 0 points  What month? 0 points 0 points 0 points  What time? 0 points 0 points 0 points  Count back from 20 0 points 0 points 0 points  Months in reverse 0 points 2 points 0 points  Repeat phrase 2 points 2 points 0 points  Total Score 2 points 4 points 0 points    Immunizations Immunization History  Administered Date(s) Administered   Fluad Quad(high Dose 65+) 01/05/2019, 12/07/2021   H1N1 04/07/2008   Influenza Whole 12/29/2008, 12/22/2009   Influenza, High Dose Seasonal PF 01/15/2012, 12/23/2014, 01/22/2020   Influenza,inj,Quad PF,6+ Mos 01/22/2014, 12/10/2016, 01/17/2018   Influenza,trivalent, recombinat, inj, PF 01/05/2016   Influenza-Unspecified 04/07/2008, 12/15/2014, 01/13/2021   Moderna Covid-19 Vaccine Bivalent Booster 33yr & up 01/29/2022   Moderna Sars-Covid-2 Vaccination 04/07/2019, 05/05/2019, 02/09/2020   PFIZER(Purple Top)SARS-COV-2 Vaccination 01/06/2021   PPD Test 07/17/2010, 04/19/2015, 04/25/2015   Pneumococcal Conjugate-13 11/10/2013   Pneumococcal Polysaccharide-23 12/27/2010, 12/05/2016   Tdap 04/15/2017   Zoster Recombinat (Shingrix) 04/09/2017, 06/18/2017    TDAP status: Up to date  Flu Vaccine status: Up to date  Pneumococcal vaccine status: Up to date  Covid-19 vaccine status: Completed vaccines  Qualifies for Shingles Vaccine? Yes   Zostavax completed Yes   Shingrix Completed?: Yes  Screening Tests Health Maintenance  Topic Date Due    COVID-19 Vaccine (6 - 2023-24 season) 03/26/2022   DTaP/Tdap/Td (2 - Td or Tdap) 04/16/2027   Pneumonia Vaccine 88 Years old  Completed   INFLUENZA VACCINE  Completed   DEXA SCAN  Completed   Zoster Vaccines- Shingrix  Completed   HPV VACCINES  Aged Out    Health Maintenance  Health Maintenance Due  Topic Date Due   COVID-19 Vaccine (6 - 2023-24 season) 03/26/2022    Colorectal cancer screening: No longer required.   Mammogram status: No longer required due to age.  Bone Density status: Completed 2022. Results reflect: Bone density results: OSTEOPENIA. Repeat every 2 years.  Lung Cancer Screening: (Low Dose CT Chest recommended if Age 86-80years, 30 pack-year currently smoking OR have quit w/in 15years.) does not qualify.   Lung Cancer Screening Referral: na  Additional Screening:  Hepatitis C Screening: does not qualify; Completed na  Vision Screening: Recommended annual ophthalmology exams for early detection of glaucoma and other disorders of the eye. Is the patient up to date with their annual eye exam?  Yes  Who is the provider or what is the name of the office in which the patient attends annual eye  exams? no If pt is not established with a provider, would they like to be referred to a provider to establish care? Yes . Recommend annual exam in house provider  Dental Screening: Recommended annual dental exams for proper oral hygiene   Community Resource Referral / Chronic Care Management: CRR required this visit?  No   CCM required this visit?  No      Plan:     I have personally reviewed and noted the following in the patient's chart:   Medical and social history Use of alcohol, tobacco or illicit drugs  Current medications and supplements including opioid prescriptions. Patient is not currently taking opioid prescriptions. Functional ability and status Nutritional status Physical activity Advanced directives List of other physicians Hospitalizations,  surgeries, and ER visits in previous 12 months Vitals Screenings to include cognitive, depression, and falls Referrals and appointments  In addition, I have reviewed and discussed with patient certain preventive protocols, quality metrics, and best practice recommendations. A written personalized care plan for preventive services as well as general preventive health recommendations were provided to patient.     Royal Hawthorn, NP   03/22/2022   Nurse Notes: [name] her daughter expressed during the visit that recently Ms. Lapaglia has been reading less and communicating less. When she is in social situations she lights up and is much more interactive and happy.

## 2022-03-26 DIAGNOSIS — R296 Repeated falls: Secondary | ICD-10-CM | POA: Diagnosis not present

## 2022-03-26 DIAGNOSIS — R262 Difficulty in walking, not elsewhere classified: Secondary | ICD-10-CM | POA: Diagnosis not present

## 2022-03-26 DIAGNOSIS — N39498 Other specified urinary incontinence: Secondary | ICD-10-CM | POA: Diagnosis not present

## 2022-03-26 DIAGNOSIS — R4189 Other symptoms and signs involving cognitive functions and awareness: Secondary | ICD-10-CM | POA: Diagnosis not present

## 2022-03-26 DIAGNOSIS — R41841 Cognitive communication deficit: Secondary | ICD-10-CM | POA: Diagnosis not present

## 2022-03-26 DIAGNOSIS — I4821 Permanent atrial fibrillation: Secondary | ICD-10-CM | POA: Diagnosis not present

## 2022-03-26 DIAGNOSIS — M6389 Disorders of muscle in diseases classified elsewhere, multiple sites: Secondary | ICD-10-CM | POA: Diagnosis not present

## 2022-03-26 DIAGNOSIS — R278 Other lack of coordination: Secondary | ICD-10-CM | POA: Diagnosis not present

## 2022-03-26 DIAGNOSIS — F01A0 Vascular dementia, mild, without behavioral disturbance, psychotic disturbance, mood disturbance, and anxiety: Secondary | ICD-10-CM | POA: Diagnosis not present

## 2022-03-26 DIAGNOSIS — R2681 Unsteadiness on feet: Secondary | ICD-10-CM | POA: Diagnosis not present

## 2022-03-26 DIAGNOSIS — S82891D Other fracture of right lower leg, subsequent encounter for closed fracture with routine healing: Secondary | ICD-10-CM | POA: Diagnosis not present

## 2022-03-26 DIAGNOSIS — S82891B Other fracture of right lower leg, initial encounter for open fracture type I or II: Secondary | ICD-10-CM | POA: Diagnosis not present

## 2022-03-26 DIAGNOSIS — Z9181 History of falling: Secondary | ICD-10-CM | POA: Diagnosis not present

## 2022-03-27 DIAGNOSIS — R262 Difficulty in walking, not elsewhere classified: Secondary | ICD-10-CM | POA: Diagnosis not present

## 2022-03-27 DIAGNOSIS — F01A0 Vascular dementia, mild, without behavioral disturbance, psychotic disturbance, mood disturbance, and anxiety: Secondary | ICD-10-CM | POA: Diagnosis not present

## 2022-03-27 DIAGNOSIS — R41841 Cognitive communication deficit: Secondary | ICD-10-CM | POA: Diagnosis not present

## 2022-03-27 DIAGNOSIS — N39498 Other specified urinary incontinence: Secondary | ICD-10-CM | POA: Diagnosis not present

## 2022-03-27 DIAGNOSIS — S82891B Other fracture of right lower leg, initial encounter for open fracture type I or II: Secondary | ICD-10-CM | POA: Diagnosis not present

## 2022-03-27 DIAGNOSIS — S82891D Other fracture of right lower leg, subsequent encounter for closed fracture with routine healing: Secondary | ICD-10-CM | POA: Diagnosis not present

## 2022-03-28 DIAGNOSIS — R41841 Cognitive communication deficit: Secondary | ICD-10-CM | POA: Diagnosis not present

## 2022-03-28 DIAGNOSIS — S82891D Other fracture of right lower leg, subsequent encounter for closed fracture with routine healing: Secondary | ICD-10-CM | POA: Diagnosis not present

## 2022-03-28 DIAGNOSIS — N39498 Other specified urinary incontinence: Secondary | ICD-10-CM | POA: Diagnosis not present

## 2022-03-28 DIAGNOSIS — S82891B Other fracture of right lower leg, initial encounter for open fracture type I or II: Secondary | ICD-10-CM | POA: Diagnosis not present

## 2022-03-28 DIAGNOSIS — F01A0 Vascular dementia, mild, without behavioral disturbance, psychotic disturbance, mood disturbance, and anxiety: Secondary | ICD-10-CM | POA: Diagnosis not present

## 2022-03-28 DIAGNOSIS — R262 Difficulty in walking, not elsewhere classified: Secondary | ICD-10-CM | POA: Diagnosis not present

## 2022-03-29 DIAGNOSIS — S82891B Other fracture of right lower leg, initial encounter for open fracture type I or II: Secondary | ICD-10-CM | POA: Diagnosis not present

## 2022-03-29 DIAGNOSIS — F01A0 Vascular dementia, mild, without behavioral disturbance, psychotic disturbance, mood disturbance, and anxiety: Secondary | ICD-10-CM | POA: Diagnosis not present

## 2022-03-29 DIAGNOSIS — R262 Difficulty in walking, not elsewhere classified: Secondary | ICD-10-CM | POA: Diagnosis not present

## 2022-03-29 DIAGNOSIS — R41841 Cognitive communication deficit: Secondary | ICD-10-CM | POA: Diagnosis not present

## 2022-03-29 DIAGNOSIS — S82891D Other fracture of right lower leg, subsequent encounter for closed fracture with routine healing: Secondary | ICD-10-CM | POA: Diagnosis not present

## 2022-03-29 DIAGNOSIS — N39498 Other specified urinary incontinence: Secondary | ICD-10-CM | POA: Diagnosis not present

## 2022-03-30 DIAGNOSIS — S82891D Other fracture of right lower leg, subsequent encounter for closed fracture with routine healing: Secondary | ICD-10-CM | POA: Diagnosis not present

## 2022-03-30 DIAGNOSIS — R41841 Cognitive communication deficit: Secondary | ICD-10-CM | POA: Diagnosis not present

## 2022-03-30 DIAGNOSIS — F01A0 Vascular dementia, mild, without behavioral disturbance, psychotic disturbance, mood disturbance, and anxiety: Secondary | ICD-10-CM | POA: Diagnosis not present

## 2022-03-30 DIAGNOSIS — R262 Difficulty in walking, not elsewhere classified: Secondary | ICD-10-CM | POA: Diagnosis not present

## 2022-03-30 DIAGNOSIS — N39498 Other specified urinary incontinence: Secondary | ICD-10-CM | POA: Diagnosis not present

## 2022-03-30 DIAGNOSIS — S82891B Other fracture of right lower leg, initial encounter for open fracture type I or II: Secondary | ICD-10-CM | POA: Diagnosis not present

## 2022-04-02 DIAGNOSIS — S82891D Other fracture of right lower leg, subsequent encounter for closed fracture with routine healing: Secondary | ICD-10-CM | POA: Diagnosis not present

## 2022-04-02 DIAGNOSIS — R262 Difficulty in walking, not elsewhere classified: Secondary | ICD-10-CM | POA: Diagnosis not present

## 2022-04-02 DIAGNOSIS — F01A0 Vascular dementia, mild, without behavioral disturbance, psychotic disturbance, mood disturbance, and anxiety: Secondary | ICD-10-CM | POA: Diagnosis not present

## 2022-04-02 DIAGNOSIS — S82891B Other fracture of right lower leg, initial encounter for open fracture type I or II: Secondary | ICD-10-CM | POA: Diagnosis not present

## 2022-04-02 DIAGNOSIS — R41841 Cognitive communication deficit: Secondary | ICD-10-CM | POA: Diagnosis not present

## 2022-04-02 DIAGNOSIS — N39498 Other specified urinary incontinence: Secondary | ICD-10-CM | POA: Diagnosis not present

## 2022-04-03 DIAGNOSIS — S82891D Other fracture of right lower leg, subsequent encounter for closed fracture with routine healing: Secondary | ICD-10-CM | POA: Diagnosis not present

## 2022-04-03 DIAGNOSIS — F01A0 Vascular dementia, mild, without behavioral disturbance, psychotic disturbance, mood disturbance, and anxiety: Secondary | ICD-10-CM | POA: Diagnosis not present

## 2022-04-03 DIAGNOSIS — R41841 Cognitive communication deficit: Secondary | ICD-10-CM | POA: Diagnosis not present

## 2022-04-03 DIAGNOSIS — N39498 Other specified urinary incontinence: Secondary | ICD-10-CM | POA: Diagnosis not present

## 2022-04-03 DIAGNOSIS — R262 Difficulty in walking, not elsewhere classified: Secondary | ICD-10-CM | POA: Diagnosis not present

## 2022-04-03 DIAGNOSIS — S82891B Other fracture of right lower leg, initial encounter for open fracture type I or II: Secondary | ICD-10-CM | POA: Diagnosis not present

## 2022-04-04 DIAGNOSIS — N39498 Other specified urinary incontinence: Secondary | ICD-10-CM | POA: Diagnosis not present

## 2022-04-04 DIAGNOSIS — S82891B Other fracture of right lower leg, initial encounter for open fracture type I or II: Secondary | ICD-10-CM | POA: Diagnosis not present

## 2022-04-04 DIAGNOSIS — R41841 Cognitive communication deficit: Secondary | ICD-10-CM | POA: Diagnosis not present

## 2022-04-04 DIAGNOSIS — F01A0 Vascular dementia, mild, without behavioral disturbance, psychotic disturbance, mood disturbance, and anxiety: Secondary | ICD-10-CM | POA: Diagnosis not present

## 2022-04-04 DIAGNOSIS — R262 Difficulty in walking, not elsewhere classified: Secondary | ICD-10-CM | POA: Diagnosis not present

## 2022-04-04 DIAGNOSIS — S82891D Other fracture of right lower leg, subsequent encounter for closed fracture with routine healing: Secondary | ICD-10-CM | POA: Diagnosis not present

## 2022-04-05 DIAGNOSIS — R41841 Cognitive communication deficit: Secondary | ICD-10-CM | POA: Diagnosis not present

## 2022-04-05 DIAGNOSIS — R262 Difficulty in walking, not elsewhere classified: Secondary | ICD-10-CM | POA: Diagnosis not present

## 2022-04-05 DIAGNOSIS — F01A0 Vascular dementia, mild, without behavioral disturbance, psychotic disturbance, mood disturbance, and anxiety: Secondary | ICD-10-CM | POA: Diagnosis not present

## 2022-04-05 DIAGNOSIS — N39498 Other specified urinary incontinence: Secondary | ICD-10-CM | POA: Diagnosis not present

## 2022-04-05 DIAGNOSIS — S82891B Other fracture of right lower leg, initial encounter for open fracture type I or II: Secondary | ICD-10-CM | POA: Diagnosis not present

## 2022-04-05 DIAGNOSIS — S82891D Other fracture of right lower leg, subsequent encounter for closed fracture with routine healing: Secondary | ICD-10-CM | POA: Diagnosis not present

## 2022-04-06 ENCOUNTER — Encounter: Payer: Self-pay | Admitting: Adult Health

## 2022-04-06 ENCOUNTER — Encounter: Payer: Self-pay | Admitting: Internal Medicine

## 2022-04-06 ENCOUNTER — Non-Acute Institutional Stay (SKILLED_NURSING_FACILITY): Payer: Medicare Other | Admitting: Adult Health

## 2022-04-06 DIAGNOSIS — F01A0 Vascular dementia, mild, without behavioral disturbance, psychotic disturbance, mood disturbance, and anxiety: Secondary | ICD-10-CM | POA: Diagnosis not present

## 2022-04-06 DIAGNOSIS — B009 Herpesviral infection, unspecified: Secondary | ICD-10-CM

## 2022-04-06 DIAGNOSIS — R41841 Cognitive communication deficit: Secondary | ICD-10-CM | POA: Diagnosis not present

## 2022-04-06 DIAGNOSIS — S82891B Other fracture of right lower leg, initial encounter for open fracture type I or II: Secondary | ICD-10-CM | POA: Diagnosis not present

## 2022-04-06 DIAGNOSIS — R262 Difficulty in walking, not elsewhere classified: Secondary | ICD-10-CM | POA: Diagnosis not present

## 2022-04-06 DIAGNOSIS — N39498 Other specified urinary incontinence: Secondary | ICD-10-CM | POA: Diagnosis not present

## 2022-04-06 DIAGNOSIS — S82891D Other fracture of right lower leg, subsequent encounter for closed fracture with routine healing: Secondary | ICD-10-CM | POA: Diagnosis not present

## 2022-04-06 NOTE — Progress Notes (Unsigned)
Location:  [facility] Room Number: 152A Place of Service:  SNF [phone]) Provider:  Royal Hawthorn NP  Virgie Dad, MD  Patient Care Team: Virgie Dad, MD as PCP - General (Internal Medicine) Debara Pickett Nadean Corwin, MD as PCP - Cardiology (Cardiology) Pieter Partridge, DO as Consulting Physician (Neurology)  Extended Emergency Contact Information Primary Emergency Contact: [name],[name] Address: [address]          [place], [place] [address] Johnnette Litter of [place] Phone: [phone] Mobile Phone: [phone] Relation: Daughter Secondary Emergency Contact: Eula Listen Address: [address]          [place], [place] [address] Johnnette Litter of [place] Mobile Phone: [phone] Relation: Son  Code Status:  DNR Goals of care: Advanced Directive information    04/06/2022    2:25 PM  Advanced Directives  Does Patient Have a Medical Advance Directive? Yes  Type of Paramedic of [place];Out of facility DNR (pink MOST or yellow form);Living will  Does patient want to make changes to medical advance directive? No - Patient declined  Copy of [place] in Chart? Yes - validated most recent copy scanned in chart (See row information)  Pre-existing out of facility DNR order (yellow form or pink MOST form) Yellow form placed in chart (order not valid for inpatient use)     Chief Complaint  Patient presents with   Acute Visit    Patient has a cold sore     HPI:  Pt is a 89 y.o. female seen today for an acute visit for a cold sore  Resident reports pain to the right side of the upper lip with ulceration. First noticed one day ago. She has RA and is on [place]. Not currently on suppressive therapy for HSV 1.  Reports prior hx of cold sores but not frequently   Past Medical History:  Diagnosis Date   Allergy    seasonal   Anemia    Anxiety    Arthritis    Back   Asymptomatic menopausal state     Atrial fibrillation ([place])    Cancer ([place]) 2006   Colon.  Basal Cell Skin cancer- right arm   Cataract    removed bilateral   Chronic atrial fibrillation (HCC)    Colon cancer (HCC)    Constipation due to pain medication therapy    after heart surgery   Deficiency of other specified B group vitamins    Diplopia    Disorientation, unspecified    Dysrhythmia    PAF   GERD (gastroesophageal reflux disease)    Heart murmur    Hyperlipidemia    Hypertension    Hypothyroidism    Malignant neoplasm of colon, unspecified ([place])    Other specified diseases of blood and blood-forming organs    Other specified disorders of bone density and structure, unspecified forearm    Other thrombophilia ([place])    per matrix   Overactive bladder    per Matrix   Personal history of other malignant neoplasm of large intestine    Presbycusis, bilateral    per matrix   RA (rheumatoid arthritis) ([facility])    Restless leg    Seizures ([facility] [place])    after Heart Surgey   Stroke ([facility])    TIA- found by neurologist after    Valgus deformity, not elsewhere classified, right knee    per matrix   Vascular dementia, mild, without behavioral disturbance, psychotic disturbance, mood disturbance, and anxiety (  [place])    per matrix   Past Surgical History:  Procedure Laterality Date   ABDOMINAL HYSTERECTOMY  1970   Partial    COLON RESECTION  2006   cancer   COLON SURGERY     COLONOSCOPY     EYE SURGERY Bilateral    Cataract   MAXIMUM ACCESS (MAS)POSTERIOR LUMBAR INTERBODY FUSION (PLIF) 2 LEVEL N/A 04/12/2015   Procedure: Lumbar Three-Five Decompression, Pedicle Screw Fixation, and Posteriolateral Arthrodesis;  Surgeon: Erline Levine, MD;  Location: [facility];  Service: Neurosurgery;  Laterality: N/A;  L3-4 L4-5 Maximum access posterior lumbar fusion, possible interbodies and resection of synovial cyst at L4-5   MITRAL VALVE REPAIR  01/20/2013   Gore-tex cords to P1, P2, and P3. Magic suture to posterior medial  commisure, #30 Physio 1 ring. Done in [place]   TONSILLECTOMY     about [place]  01/20/2013   #28 TriAd ring done in [place]    Allergies  Allergen Reactions   Claritin [Loratadine] Other (See Comments)    Listed on MAR Unknown reaction   Codeine Other (See Comments)    "just don't take it well"   Gabapentin Other (See Comments)    Dizziness    Molds & Smuts Other (See Comments)    Dust.  Reaction is not listed on MAR    Pollen Extract Swelling    Grass   Bee Venom Swelling and Rash   Wasp Venom Swelling and Rash    Outpatient Encounter Medications as of 04/06/2022  Medication Sig   acetaminophen (TYLENOL) 500 MG tablet Take 1,000 mg by mouth 2 (two) times daily as needed for mild pain.   atorvastatin (LIPITOR) 20 MG tablet TAKE 1 TABLET DAILY (Patient taking differently: Take 20 mg by mouth at bedtime.)   Cholecalciferol (VITAMIN D) 50 MCG (2000 UT) CAPS Take 2,000 Units by mouth in the morning.   cloNIDine (CATAPRES) 0.1 MG tablet Take 1 tablet (0.1 mg total) by mouth daily as needed. If SBP >180 only (Patient taking differently: Take 0.1 mg by mouth as needed (if SBP > 180).)   digoxin (LANOXIN) 0.125 MG tablet TAKE 1 TABLET DAILY (Patient taking differently: Take 0.125 mg by mouth daily.)   Iron, Ferrous Sulfate, 325 (65 Fe) MG TABS Take 325 mg by mouth daily.   leflunomide (ARAVA) 20 MG tablet Take 20 mg by mouth in the morning.   levalbuterol (XOPENEX) 0.63 MG/3ML nebulizer solution Take 3 mLs (0.63 mg total) by nebulization in the morning and at bedtime. (Patient taking differently: Take 0.63 mg by nebulization every 6 (six) hours as needed for wheezing (cough).)   levothyroxine (SYNTHROID) 100 MCG tablet Take 1 tablet (100 mcg total) by mouth daily before breakfast.   melatonin 3 MG TABS tablet Take 1 tablet (3 mg total) by mouth at bedtime as needed.   metoprolol succinate (TOPROL-XL) 50 MG 24 hr tablet TAKE 1 TABLET DAILY (Patient taking  differently: Take 50 mg by mouth daily.)   mirtazapine (REMERON) 15 MG tablet TAKE 1 TABLET AT BEDTIME (Patient taking differently: Take 15 mg by mouth at bedtime.)   Multiple Vitamin (MULTIVITAMIN) tablet Take 1 tablet by mouth in the morning.   Multiple Vitamins-Minerals (PRESERVISION AREDS 2) CAPS Take 1 capsule by mouth 2 (two) times daily.   NON FORMULARY Fluid restriction 1800cc daily  1st- 900 cc 2nd- 700 cc 3rd- 200 cc   omeprazole (PRILOSEC) 40 MG capsule TAKE 1 CAPSULE DAILY (Patient taking differently:  Take 40 mg by mouth daily. Take 30 minutes before breakfast on a empty stomach)   ondansetron (ZOFRAN) 4 MG tablet Take 4 mg by mouth every 6 (six) hours as needed for nausea or vomiting.   sodium chloride 1 g tablet Take 1 tablet (1 g total) by mouth 2 (two) times daily with a meal. (Patient taking differently: Take 1 g by mouth once.)   valsartan (DIOVAN) 80 MG tablet Take 60 mg by mouth at bedtime. Increase valsartan to 60 mg by mouth daily and report if BP is not consistently <150/90 per MAR   Vibegron (GEMTESA) 75 MG TABS Take 32.5 mg by mouth at bedtime.   vitamin B-12 (CYANOCOBALAMIN) 1000 MCG tablet Take 1,000 mcg by mouth in the morning.   hydrocortisone (ANUSOL-HC) 25 MG suppository Place 1 suppository (25 mg total) rectally daily.   No facility-administered encounter medications on file as of 04/06/2022.    Review of Systems  Constitutional:  Negative for activity change, appetite change, chills, diaphoresis, fatigue, fever and unexpected weight change.  HENT:  Positive for mouth sores. Negative for congestion, rhinorrhea and sore throat.     Immunization History  Administered Date(s) Administered   Fluad Quad(high Dose 65+) 01/05/2019, 12/07/2021   H1N1 04/07/2008   Influenza Whole 12/29/2008, 12/22/2009   Influenza, High Dose Seasonal PF 01/15/2012, 12/23/2014, 01/22/2020   Influenza,inj,Quad PF,6+ Mos 01/22/2014, 12/10/2016, 01/17/2018   Influenza,trivalent,  recombinat, inj, PF 01/05/2016   Influenza-Unspecified 04/07/2008, 12/15/2014, 01/13/2021   Moderna Covid-19 Vaccine Bivalent Booster 60yr & up 01/29/2022   Moderna Sars-Covid-2 Vaccination 04/07/2019, 05/05/2019, 02/09/2020   PFIZER(Purple Top)SARS-COV-2 Vaccination 01/06/2021   PPD Test 07/17/2010, 04/19/2015, 04/25/2015   Pneumococcal Conjugate-13 11/10/2013   Pneumococcal Polysaccharide-23 12/27/2010, 12/05/2016   Tdap 04/15/2017   Zoster Recombinat (Shingrix) 04/09/2017, 06/18/2017   Pertinent  Health Maintenance Due  Topic Date Due   INFLUENZA VACCINE  Completed   DEXA SCAN  Completed      03/09/2022   12:00 AM 03/09/2022    2:00 PM 03/09/2022    9:32 PM 03/10/2022    7:14 AM 03/22/2022   11:45 AM  Fall Risk  Falls in the past year?     1  Was there an injury with Fall?     1  Fall Risk Category Calculator     3  Fall Risk Category     High  Patient Fall Risk Level High fall risk High fall risk High fall risk High fall risk   Patient at Risk for Falls Due to     History of fall(s)  Fall risk Follow up     Falls evaluation completed   Functional Status Survey:    Vitals:   04/06/22 1333  BP: (!) 154/77  Pulse: 69  Resp: 18  Temp: (!) 97.5 F (36.4 C)  TempSrc: Temporal  SpO2: 94%  Weight: 122 lb 3.2 oz (55.4 kg)  Height: '5\' 2"'$  (1.575 m)   Body mass index is 22.35 kg/m. Physical Exam Vitals reviewed.  Constitutional:      Appearance: Normal appearance.  HENT:     Mouth/Throat:     Mouth: Mucous membranes are moist.     Pharynx: Oropharynx is clear.     Comments: Right upper lip with herpetic lesion fluid filled vesicle crusted over.  Neurological:     Mental Status: She is alert. Mental status is at baseline.  Psychiatric:        Mood and Affect: Mood normal.     Labs  reviewed: Recent Labs    12/07/21 0548 12/20/21 2238 01/30/22 1809 02/03/22 0000 03/08/22 1105 03/09/22 0711 03/10/22 0820 03/15/22 0000  NA 137   < > 132*   < > 127* 125*  131* 132*  K 4.2   < > 4.3   < > 3.9 4.2 4.2 4.7  CL 104   < > 100   < > 92* 91* 98 100  CO2 23   < > 22   < > '24 26 26 '$ 23*  GLUCOSE 90   < > 116*   < > 138* 99 97  --   BUN 15   < > 22   < > 12 24* 26* 16  CREATININE 0.79   < > 0.88   < > 0.52 0.83 0.75 0.7  CALCIUM 9.1   < > 9.4   < > 8.6* 8.5* 8.9 8.8  MG 1.6*  --  1.9  --   --   --   --   --    < > = values in this interval not displayed.   Recent Labs    10/03/21 1055 03/06/22 1306 03/07/22 0100  AST '22 18 22  '$ ALT '18 17 16  '$ ALKPHOS 75 76 66  BILITOT 0.8 0.6 0.7  PROT 7.1 6.6 5.8*  ALBUMIN 4.5 3.8 3.1*   Recent Labs    01/04/22 0937 01/06/22 0000 01/30/22 1809 02/03/22 0000 03/06/22 1306 03/06/22 1930 03/08/22 1105 03/08/22 1826 03/09/22 0710 03/10/22 0820 03/15/22 0000  WBC 8.1   < > 7.6   < > 9.4   < > 12.6*  --  11.4* 10.1 6.2  NEUTROABS 4.7  --  4.9  --  6.9  --   --   --   --   --   --   HGB 9.7*   < > 11.2*   < > 8.3*   < > 9.6*   < > 9.6* 10.1* 9.3*  HCT 29.8*   < > 33.8*   < > 25.4*   < > 28.9*   < > 28.6* 30.5* 28*  MCV 111.2*  --  106.6*  --  104.1*   < > 100.3*  --  100.0 102.0*  --   PLT 258  --  194   < > 193   < > 177  --  212 222 246   < > = values in this interval not displayed.   Lab Results  Component Value Date   TSH 6.07 (A) 03/15/2022   Lab Results  Component Value Date   HGBA1C 5.5 01/12/2013   Lab Results  Component Value Date   CHOL 115 02/02/2021   HDL 48 02/02/2021   LDLCALC 49 02/02/2021   TRIG 91 02/02/2021    Significant Diagnostic Results in last 30 days:  No results found.  Assessment/Plan  1. Herpes simplex type 1 infection  - valACYclovir (VALTREX) 1000 MG tablet; Take 1 tablet (1,000 mg total) by mouth 2 (two) times daily for 1 day.  Dispense: 2 tablet; Refill: 0

## 2022-04-07 MED ORDER — VALACYCLOVIR HCL 1 G PO TABS: 1000.0000 mg | ORAL_TABLET | Freq: Two times a day (BID) | ORAL | 0 refills | Status: AC

## 2022-04-09 DIAGNOSIS — R41841 Cognitive communication deficit: Secondary | ICD-10-CM | POA: Diagnosis not present

## 2022-04-09 DIAGNOSIS — F01A0 Vascular dementia, mild, without behavioral disturbance, psychotic disturbance, mood disturbance, and anxiety: Secondary | ICD-10-CM | POA: Diagnosis not present

## 2022-04-09 DIAGNOSIS — S82891D Other fracture of right lower leg, subsequent encounter for closed fracture with routine healing: Secondary | ICD-10-CM | POA: Diagnosis not present

## 2022-04-09 DIAGNOSIS — R262 Difficulty in walking, not elsewhere classified: Secondary | ICD-10-CM | POA: Diagnosis not present

## 2022-04-09 DIAGNOSIS — N39498 Other specified urinary incontinence: Secondary | ICD-10-CM | POA: Diagnosis not present

## 2022-04-09 DIAGNOSIS — S82891B Other fracture of right lower leg, initial encounter for open fracture type I or II: Secondary | ICD-10-CM | POA: Diagnosis not present

## 2022-04-10 DIAGNOSIS — F01A0 Vascular dementia, mild, without behavioral disturbance, psychotic disturbance, mood disturbance, and anxiety: Secondary | ICD-10-CM | POA: Diagnosis not present

## 2022-04-10 DIAGNOSIS — S82891B Other fracture of right lower leg, initial encounter for open fracture type I or II: Secondary | ICD-10-CM | POA: Diagnosis not present

## 2022-04-10 DIAGNOSIS — S82891D Other fracture of right lower leg, subsequent encounter for closed fracture with routine healing: Secondary | ICD-10-CM | POA: Diagnosis not present

## 2022-04-10 DIAGNOSIS — N39498 Other specified urinary incontinence: Secondary | ICD-10-CM | POA: Diagnosis not present

## 2022-04-10 DIAGNOSIS — R262 Difficulty in walking, not elsewhere classified: Secondary | ICD-10-CM | POA: Diagnosis not present

## 2022-04-10 DIAGNOSIS — R41841 Cognitive communication deficit: Secondary | ICD-10-CM | POA: Diagnosis not present

## 2022-04-11 DIAGNOSIS — R262 Difficulty in walking, not elsewhere classified: Secondary | ICD-10-CM | POA: Diagnosis not present

## 2022-04-11 DIAGNOSIS — S82891D Other fracture of right lower leg, subsequent encounter for closed fracture with routine healing: Secondary | ICD-10-CM | POA: Diagnosis not present

## 2022-04-11 DIAGNOSIS — F01A0 Vascular dementia, mild, without behavioral disturbance, psychotic disturbance, mood disturbance, and anxiety: Secondary | ICD-10-CM | POA: Diagnosis not present

## 2022-04-11 DIAGNOSIS — N39498 Other specified urinary incontinence: Secondary | ICD-10-CM | POA: Diagnosis not present

## 2022-04-11 DIAGNOSIS — R41841 Cognitive communication deficit: Secondary | ICD-10-CM | POA: Diagnosis not present

## 2022-04-11 DIAGNOSIS — S82891B Other fracture of right lower leg, initial encounter for open fracture type I or II: Secondary | ICD-10-CM | POA: Diagnosis not present

## 2022-04-12 DIAGNOSIS — N39498 Other specified urinary incontinence: Secondary | ICD-10-CM | POA: Diagnosis not present

## 2022-04-12 DIAGNOSIS — R41841 Cognitive communication deficit: Secondary | ICD-10-CM | POA: Diagnosis not present

## 2022-04-12 DIAGNOSIS — F01A0 Vascular dementia, mild, without behavioral disturbance, psychotic disturbance, mood disturbance, and anxiety: Secondary | ICD-10-CM | POA: Diagnosis not present

## 2022-04-12 DIAGNOSIS — S82891D Other fracture of right lower leg, subsequent encounter for closed fracture with routine healing: Secondary | ICD-10-CM | POA: Diagnosis not present

## 2022-04-12 DIAGNOSIS — S82891B Other fracture of right lower leg, initial encounter for open fracture type I or II: Secondary | ICD-10-CM | POA: Diagnosis not present

## 2022-04-12 DIAGNOSIS — R262 Difficulty in walking, not elsewhere classified: Secondary | ICD-10-CM | POA: Diagnosis not present

## 2022-04-13 DIAGNOSIS — S82891D Other fracture of right lower leg, subsequent encounter for closed fracture with routine healing: Secondary | ICD-10-CM | POA: Diagnosis not present

## 2022-04-13 DIAGNOSIS — S82891B Other fracture of right lower leg, initial encounter for open fracture type I or II: Secondary | ICD-10-CM | POA: Diagnosis not present

## 2022-04-13 DIAGNOSIS — N39498 Other specified urinary incontinence: Secondary | ICD-10-CM | POA: Diagnosis not present

## 2022-04-13 DIAGNOSIS — R41841 Cognitive communication deficit: Secondary | ICD-10-CM | POA: Diagnosis not present

## 2022-04-13 DIAGNOSIS — F01A0 Vascular dementia, mild, without behavioral disturbance, psychotic disturbance, mood disturbance, and anxiety: Secondary | ICD-10-CM | POA: Diagnosis not present

## 2022-04-13 DIAGNOSIS — R262 Difficulty in walking, not elsewhere classified: Secondary | ICD-10-CM | POA: Diagnosis not present

## 2022-04-15 DIAGNOSIS — F01A0 Vascular dementia, mild, without behavioral disturbance, psychotic disturbance, mood disturbance, and anxiety: Secondary | ICD-10-CM | POA: Diagnosis not present

## 2022-04-15 DIAGNOSIS — R41841 Cognitive communication deficit: Secondary | ICD-10-CM | POA: Diagnosis not present

## 2022-04-15 DIAGNOSIS — S82891D Other fracture of right lower leg, subsequent encounter for closed fracture with routine healing: Secondary | ICD-10-CM | POA: Diagnosis not present

## 2022-04-15 DIAGNOSIS — R262 Difficulty in walking, not elsewhere classified: Secondary | ICD-10-CM | POA: Diagnosis not present

## 2022-04-15 DIAGNOSIS — N39498 Other specified urinary incontinence: Secondary | ICD-10-CM | POA: Diagnosis not present

## 2022-04-15 DIAGNOSIS — S82891B Other fracture of right lower leg, initial encounter for open fracture type I or II: Secondary | ICD-10-CM | POA: Diagnosis not present

## 2022-04-16 DIAGNOSIS — S82891D Other fracture of right lower leg, subsequent encounter for closed fracture with routine healing: Secondary | ICD-10-CM | POA: Diagnosis not present

## 2022-04-16 DIAGNOSIS — R41841 Cognitive communication deficit: Secondary | ICD-10-CM | POA: Diagnosis not present

## 2022-04-16 DIAGNOSIS — F01A0 Vascular dementia, mild, without behavioral disturbance, psychotic disturbance, mood disturbance, and anxiety: Secondary | ICD-10-CM | POA: Diagnosis not present

## 2022-04-16 DIAGNOSIS — S82891B Other fracture of right lower leg, initial encounter for open fracture type I or II: Secondary | ICD-10-CM | POA: Diagnosis not present

## 2022-04-16 DIAGNOSIS — R262 Difficulty in walking, not elsewhere classified: Secondary | ICD-10-CM | POA: Diagnosis not present

## 2022-04-16 DIAGNOSIS — N39498 Other specified urinary incontinence: Secondary | ICD-10-CM | POA: Diagnosis not present

## 2022-04-17 ENCOUNTER — Inpatient Hospital Stay (HOSPITAL_BASED_OUTPATIENT_CLINIC_OR_DEPARTMENT_OTHER): Payer: Medicare Other | Admitting: Oncology

## 2022-04-17 ENCOUNTER — Inpatient Hospital Stay: Payer: Medicare Other | Attending: Oncology

## 2022-04-17 VITALS — BP 138/69 | HR 74 | Temp 98.2°F | Resp 18 | Ht 62.0 in | Wt 122.0 lb

## 2022-04-17 DIAGNOSIS — I4891 Unspecified atrial fibrillation: Secondary | ICD-10-CM | POA: Insufficient documentation

## 2022-04-17 DIAGNOSIS — E039 Hypothyroidism, unspecified: Secondary | ICD-10-CM | POA: Insufficient documentation

## 2022-04-17 DIAGNOSIS — Z08 Encounter for follow-up examination after completed treatment for malignant neoplasm: Secondary | ICD-10-CM | POA: Insufficient documentation

## 2022-04-17 DIAGNOSIS — C185 Malignant neoplasm of splenic flexure: Secondary | ICD-10-CM

## 2022-04-17 DIAGNOSIS — Z7901 Long term (current) use of anticoagulants: Secondary | ICD-10-CM | POA: Insufficient documentation

## 2022-04-17 DIAGNOSIS — M069 Rheumatoid arthritis, unspecified: Secondary | ICD-10-CM | POA: Diagnosis not present

## 2022-04-17 DIAGNOSIS — D539 Nutritional anemia, unspecified: Secondary | ICD-10-CM | POA: Diagnosis not present

## 2022-04-17 DIAGNOSIS — Z85038 Personal history of other malignant neoplasm of large intestine: Secondary | ICD-10-CM | POA: Insufficient documentation

## 2022-04-17 DIAGNOSIS — R970 Elevated carcinoembryonic antigen [CEA]: Secondary | ICD-10-CM | POA: Diagnosis not present

## 2022-04-17 LAB — CBC WITH DIFFERENTIAL (CANCER CENTER ONLY)
Abs Immature Granulocytes: 0.03 10*3/uL (ref 0.00–0.07)
Basophils Absolute: 0 10*3/uL (ref 0.0–0.1)
Basophils Relative: 0 %
Eosinophils Absolute: 0.1 10*3/uL (ref 0.0–0.5)
Eosinophils Relative: 1 %
HCT: 34.3 % — ABNORMAL LOW (ref 36.0–46.0)
Hemoglobin: 11.3 g/dL — ABNORMAL LOW (ref 12.0–15.0)
Immature Granulocytes: 0 %
Lymphocytes Relative: 21 %
Lymphs Abs: 1.6 10*3/uL (ref 0.7–4.0)
MCH: 33.2 pg (ref 26.0–34.0)
MCHC: 32.9 g/dL (ref 30.0–36.0)
MCV: 100.9 fL — ABNORMAL HIGH (ref 80.0–100.0)
Monocytes Absolute: 1 10*3/uL (ref 0.1–1.0)
Monocytes Relative: 13 %
Neutro Abs: 4.8 10*3/uL (ref 1.7–7.7)
Neutrophils Relative %: 65 %
Platelet Count: 186 10*3/uL (ref 150–400)
RBC: 3.4 MIL/uL — ABNORMAL LOW (ref 3.87–5.11)
RDW: 16.2 % — ABNORMAL HIGH (ref 11.5–15.5)
WBC Count: 7.5 10*3/uL (ref 4.0–10.5)
nRBC: 0 % (ref 0.0–0.2)

## 2022-04-17 LAB — FERRITIN: Ferritin: 116 ng/mL (ref 11–307)

## 2022-04-17 LAB — TSH: TSH: 3.519 u[IU]/mL (ref 0.350–4.500)

## 2022-04-17 LAB — CEA (ACCESS): CEA (CHCC): 6.89 ng/mL — ABNORMAL HIGH (ref 0.00–5.00)

## 2022-04-17 NOTE — Progress Notes (Signed)
[facility] OFFICE PROGRESS NOTE   Diagnosis: Colon cancer  INTERVAL HISTORY:   Kathy Velez returns as scheduled.  She was admitted in December with acute GI bleeding.  A CT of the abdomen pelvis revealed diverticulosis without evidence of diverticulitis.  She did not undergo a colonoscopy.  The bleeding resolved.  She was transfused 1 unit of packed red blood cells.  She is here today with her daughter.  She continues to live in the nursing area of [name].  She is followed by orthopedics for a right ankle fracture.  She continues to wear a boot and is scheduled for follow-up later this week.  She continues to have a dry mouth.  Stable arthritis symptoms.  Objective:  Vital signs in last 24 hours:  Blood pressure 138/69, pulse 74, temperature 98.2 F (36.8 C), resp. rate 18, height '5\' 2"'$  (1.575 m), weight 122 lb (55.3 kg), SpO2 98 %.    HEENT: No thrush or ulcers Lymphatics: No cervical, supraclavicular, axillary, or inguinal nodes Resp: Coarse end inspiratory rhonchi at the posterior base bilaterally, no respiratory distress Cardio: Irregular GI: Mildly distended, no mass, no hepatosplenomegaly Vascular: No leg edema   Lab Results:  Lab Results  Component Value Date   WBC 7.5 04/17/2022   HGB 11.3 (L) 04/17/2022   HCT 34.3 (L) 04/17/2022   MCV 100.9 (H) 04/17/2022   PLT 186 04/17/2022   NEUTROABS 4.8 04/17/2022    CMP  Lab Results  Component Value Date   NA 132 (A) 03/15/2022   K 4.7 03/15/2022   CL 100 03/15/2022   CO2 23 (A) 03/15/2022   GLUCOSE 97 03/10/2022   BUN 16 03/15/2022   CREATININE 0.7 03/15/2022   CALCIUM 8.8 03/15/2022   PROT 5.8 (L) 03/07/2022   ALBUMIN 3.1 (L) 03/07/2022   AST 22 03/07/2022   ALT 16 03/07/2022   ALKPHOS 66 03/07/2022   BILITOT 0.7 03/07/2022   GFRNONAA >60 03/10/2022   GFRAA 79.35 02/02/2021    Lab Results  Component Value Date   CEA1 8.2 (H) 03/07/2022   CEA 3.83 05/16/2021    Lab Results   Component Value Date   INR 1.0 03/06/2022   LABPROT 13.5 03/06/2022    Imaging:  No results found.  Medications: I have reviewed the patient's current medications.   Assessment/Plan:  Colon cancer-splenic flexure mass on colonoscopy 02/22/2021 Biopsy 02/22/2021-invasive well differentiated adenocarcinoma, loss of MLH1 and PMS2 expression, MSI high, MLH1 hyper methylation present CTs 03/31/2021-no evidence of metastatic disease, possible mass at the splenic flexure, 4 mm left upper lung nodule, bilateral lung scarring Cycle 1 pembrolizumab 06/13/2021 Cycle 2 pembrolizumab 07/04/2021 Cycle 3 pembrolizumab 07/25/2021 Cycle 4 pembrolizumab 08/22/2021 Colonoscopy 09/20/2021-no residual mass at the splenic flexure/descending colon CT abdomen/pelvis 03/06/2022-diverticulosis without evidence of acute diverticulitis, partial colectomy Macrocytic anemia Sessile serrated polyp with focus of high-grade dysplasia on colonoscopy 02/22/2021 Colon cancer 2006, status post a right colectomy-"node positive "cancer per patient, received adjuvant chemotherapy Atrial fibrillation, maintained on anticoagulation History of a CVA and TIA Mild cognitive impairment Rheumatoid arthritis Hypothyroidism 10.  Chronic lung disease? 73.  Fall with head injury 10/22/2021 Scalp hematoma with bleeding 12/06/2021 12.  01/30/2022-displaced distal right fibular fracture, small displaced posterior malleolar fracture 13.  Admission 03/07/2022 with acute GI bleeding 14.  Fall with right distal fibular/malleolar fracture [place] 2023    Disposition: Kathy Velez has a history of colon cancer.  She entered clinical remission after treatment with pembrolizumab last year.  There is  no clinical evidence of disease progression.  A CT abdomen/pelvis 03/06/2022 did not reveal evidence of progressive cancer.  The CEA was mildly elevated on 03/07/2022.  The hemoglobin has improved after admission with GI bleeding last month.  Her  overall clinical status appears stable.  We will follow-up on the CEA from today.  She will return for an office and lab visit in 2 months.  We will consider resuming pembrolizumab if she develops evidence of progressive colon cancer.    Betsy Coder, MD  04/17/2022  12:28 PM

## 2022-04-18 DIAGNOSIS — S82891B Other fracture of right lower leg, initial encounter for open fracture type I or II: Secondary | ICD-10-CM | POA: Diagnosis not present

## 2022-04-18 DIAGNOSIS — F01A0 Vascular dementia, mild, without behavioral disturbance, psychotic disturbance, mood disturbance, and anxiety: Secondary | ICD-10-CM | POA: Diagnosis not present

## 2022-04-18 DIAGNOSIS — N39498 Other specified urinary incontinence: Secondary | ICD-10-CM | POA: Diagnosis not present

## 2022-04-18 DIAGNOSIS — S82891D Other fracture of right lower leg, subsequent encounter for closed fracture with routine healing: Secondary | ICD-10-CM | POA: Diagnosis not present

## 2022-04-18 DIAGNOSIS — R41841 Cognitive communication deficit: Secondary | ICD-10-CM | POA: Diagnosis not present

## 2022-04-18 DIAGNOSIS — R262 Difficulty in walking, not elsewhere classified: Secondary | ICD-10-CM | POA: Diagnosis not present

## 2022-04-19 DIAGNOSIS — S82891B Other fracture of right lower leg, initial encounter for open fracture type I or II: Secondary | ICD-10-CM | POA: Diagnosis not present

## 2022-04-19 DIAGNOSIS — F01A0 Vascular dementia, mild, without behavioral disturbance, psychotic disturbance, mood disturbance, and anxiety: Secondary | ICD-10-CM | POA: Diagnosis not present

## 2022-04-19 DIAGNOSIS — R262 Difficulty in walking, not elsewhere classified: Secondary | ICD-10-CM | POA: Diagnosis not present

## 2022-04-19 DIAGNOSIS — S82891D Other fracture of right lower leg, subsequent encounter for closed fracture with routine healing: Secondary | ICD-10-CM | POA: Diagnosis not present

## 2022-04-19 DIAGNOSIS — R41841 Cognitive communication deficit: Secondary | ICD-10-CM | POA: Diagnosis not present

## 2022-04-19 DIAGNOSIS — N39498 Other specified urinary incontinence: Secondary | ICD-10-CM | POA: Diagnosis not present

## 2022-04-19 LAB — T4: T4, Total: 7.3 ug/dL (ref 4.5–12.0)

## 2022-04-20 ENCOUNTER — Ambulatory Visit: Payer: Medicare Other | Admitting: Internal Medicine

## 2022-04-20 DIAGNOSIS — R262 Difficulty in walking, not elsewhere classified: Secondary | ICD-10-CM | POA: Diagnosis not present

## 2022-04-20 DIAGNOSIS — F01A0 Vascular dementia, mild, without behavioral disturbance, psychotic disturbance, mood disturbance, and anxiety: Secondary | ICD-10-CM | POA: Diagnosis not present

## 2022-04-20 DIAGNOSIS — S82891B Other fracture of right lower leg, initial encounter for open fracture type I or II: Secondary | ICD-10-CM | POA: Diagnosis not present

## 2022-04-20 DIAGNOSIS — N39498 Other specified urinary incontinence: Secondary | ICD-10-CM | POA: Diagnosis not present

## 2022-04-20 DIAGNOSIS — R41841 Cognitive communication deficit: Secondary | ICD-10-CM | POA: Diagnosis not present

## 2022-04-20 DIAGNOSIS — S82891D Other fracture of right lower leg, subsequent encounter for closed fracture with routine healing: Secondary | ICD-10-CM | POA: Diagnosis not present

## 2022-04-20 DIAGNOSIS — S82841D Displaced bimalleolar fracture of right lower leg, subsequent encounter for closed fracture with routine healing: Secondary | ICD-10-CM | POA: Diagnosis not present

## 2022-04-23 DIAGNOSIS — F01A0 Vascular dementia, mild, without behavioral disturbance, psychotic disturbance, mood disturbance, and anxiety: Secondary | ICD-10-CM | POA: Diagnosis not present

## 2022-04-23 DIAGNOSIS — R41841 Cognitive communication deficit: Secondary | ICD-10-CM | POA: Diagnosis not present

## 2022-04-23 DIAGNOSIS — N39498 Other specified urinary incontinence: Secondary | ICD-10-CM | POA: Diagnosis not present

## 2022-04-23 DIAGNOSIS — R262 Difficulty in walking, not elsewhere classified: Secondary | ICD-10-CM | POA: Diagnosis not present

## 2022-04-23 DIAGNOSIS — S82891D Other fracture of right lower leg, subsequent encounter for closed fracture with routine healing: Secondary | ICD-10-CM | POA: Diagnosis not present

## 2022-04-23 DIAGNOSIS — S82891B Other fracture of right lower leg, initial encounter for open fracture type I or II: Secondary | ICD-10-CM | POA: Diagnosis not present

## 2022-04-24 DIAGNOSIS — N39498 Other specified urinary incontinence: Secondary | ICD-10-CM | POA: Diagnosis not present

## 2022-04-24 DIAGNOSIS — F01A0 Vascular dementia, mild, without behavioral disturbance, psychotic disturbance, mood disturbance, and anxiety: Secondary | ICD-10-CM | POA: Diagnosis not present

## 2022-04-24 DIAGNOSIS — R262 Difficulty in walking, not elsewhere classified: Secondary | ICD-10-CM | POA: Diagnosis not present

## 2022-04-24 DIAGNOSIS — S82891D Other fracture of right lower leg, subsequent encounter for closed fracture with routine healing: Secondary | ICD-10-CM | POA: Diagnosis not present

## 2022-04-24 DIAGNOSIS — R41841 Cognitive communication deficit: Secondary | ICD-10-CM | POA: Diagnosis not present

## 2022-04-24 DIAGNOSIS — S82891B Other fracture of right lower leg, initial encounter for open fracture type I or II: Secondary | ICD-10-CM | POA: Diagnosis not present

## 2022-04-25 DIAGNOSIS — R41841 Cognitive communication deficit: Secondary | ICD-10-CM | POA: Diagnosis not present

## 2022-04-25 DIAGNOSIS — R262 Difficulty in walking, not elsewhere classified: Secondary | ICD-10-CM | POA: Diagnosis not present

## 2022-04-25 DIAGNOSIS — N39498 Other specified urinary incontinence: Secondary | ICD-10-CM | POA: Diagnosis not present

## 2022-04-25 DIAGNOSIS — F01A0 Vascular dementia, mild, without behavioral disturbance, psychotic disturbance, mood disturbance, and anxiety: Secondary | ICD-10-CM | POA: Diagnosis not present

## 2022-04-25 DIAGNOSIS — S82891D Other fracture of right lower leg, subsequent encounter for closed fracture with routine healing: Secondary | ICD-10-CM | POA: Diagnosis not present

## 2022-04-25 DIAGNOSIS — S82891B Other fracture of right lower leg, initial encounter for open fracture type I or II: Secondary | ICD-10-CM | POA: Diagnosis not present

## 2022-04-26 DIAGNOSIS — I4821 Permanent atrial fibrillation: Secondary | ICD-10-CM | POA: Diagnosis not present

## 2022-04-26 DIAGNOSIS — F01A0 Vascular dementia, mild, without behavioral disturbance, psychotic disturbance, mood disturbance, and anxiety: Secondary | ICD-10-CM | POA: Diagnosis not present

## 2022-04-26 DIAGNOSIS — Z9181 History of falling: Secondary | ICD-10-CM | POA: Diagnosis not present

## 2022-04-26 DIAGNOSIS — M6389 Disorders of muscle in diseases classified elsewhere, multiple sites: Secondary | ICD-10-CM | POA: Diagnosis not present

## 2022-04-26 DIAGNOSIS — R41841 Cognitive communication deficit: Secondary | ICD-10-CM | POA: Diagnosis not present

## 2022-04-26 DIAGNOSIS — R262 Difficulty in walking, not elsewhere classified: Secondary | ICD-10-CM | POA: Diagnosis not present

## 2022-04-26 DIAGNOSIS — R2681 Unsteadiness on feet: Secondary | ICD-10-CM | POA: Diagnosis not present

## 2022-04-26 DIAGNOSIS — R4189 Other symptoms and signs involving cognitive functions and awareness: Secondary | ICD-10-CM | POA: Diagnosis not present

## 2022-04-26 DIAGNOSIS — N39498 Other specified urinary incontinence: Secondary | ICD-10-CM | POA: Diagnosis not present

## 2022-04-26 DIAGNOSIS — R296 Repeated falls: Secondary | ICD-10-CM | POA: Diagnosis not present

## 2022-04-26 DIAGNOSIS — S82891B Other fracture of right lower leg, initial encounter for open fracture type I or II: Secondary | ICD-10-CM | POA: Diagnosis not present

## 2022-04-26 DIAGNOSIS — S82891D Other fracture of right lower leg, subsequent encounter for closed fracture with routine healing: Secondary | ICD-10-CM | POA: Diagnosis not present

## 2022-04-26 DIAGNOSIS — R278 Other lack of coordination: Secondary | ICD-10-CM | POA: Diagnosis not present

## 2022-04-27 DIAGNOSIS — N39498 Other specified urinary incontinence: Secondary | ICD-10-CM | POA: Diagnosis not present

## 2022-04-27 DIAGNOSIS — R262 Difficulty in walking, not elsewhere classified: Secondary | ICD-10-CM | POA: Diagnosis not present

## 2022-04-27 DIAGNOSIS — F01A0 Vascular dementia, mild, without behavioral disturbance, psychotic disturbance, mood disturbance, and anxiety: Secondary | ICD-10-CM | POA: Diagnosis not present

## 2022-04-27 DIAGNOSIS — S82891D Other fracture of right lower leg, subsequent encounter for closed fracture with routine healing: Secondary | ICD-10-CM | POA: Diagnosis not present

## 2022-04-27 DIAGNOSIS — R41841 Cognitive communication deficit: Secondary | ICD-10-CM | POA: Diagnosis not present

## 2022-04-27 DIAGNOSIS — S82891B Other fracture of right lower leg, initial encounter for open fracture type I or II: Secondary | ICD-10-CM | POA: Diagnosis not present

## 2022-04-28 DIAGNOSIS — F01A0 Vascular dementia, mild, without behavioral disturbance, psychotic disturbance, mood disturbance, and anxiety: Secondary | ICD-10-CM | POA: Diagnosis not present

## 2022-04-28 DIAGNOSIS — N39498 Other specified urinary incontinence: Secondary | ICD-10-CM | POA: Diagnosis not present

## 2022-04-28 DIAGNOSIS — S82891D Other fracture of right lower leg, subsequent encounter for closed fracture with routine healing: Secondary | ICD-10-CM | POA: Diagnosis not present

## 2022-04-28 DIAGNOSIS — R41841 Cognitive communication deficit: Secondary | ICD-10-CM | POA: Diagnosis not present

## 2022-04-28 DIAGNOSIS — S82891B Other fracture of right lower leg, initial encounter for open fracture type I or II: Secondary | ICD-10-CM | POA: Diagnosis not present

## 2022-04-28 DIAGNOSIS — R262 Difficulty in walking, not elsewhere classified: Secondary | ICD-10-CM | POA: Diagnosis not present

## 2022-04-30 DIAGNOSIS — R41841 Cognitive communication deficit: Secondary | ICD-10-CM | POA: Diagnosis not present

## 2022-04-30 DIAGNOSIS — N39498 Other specified urinary incontinence: Secondary | ICD-10-CM | POA: Diagnosis not present

## 2022-04-30 DIAGNOSIS — F01A0 Vascular dementia, mild, without behavioral disturbance, psychotic disturbance, mood disturbance, and anxiety: Secondary | ICD-10-CM | POA: Diagnosis not present

## 2022-04-30 DIAGNOSIS — S82891D Other fracture of right lower leg, subsequent encounter for closed fracture with routine healing: Secondary | ICD-10-CM | POA: Diagnosis not present

## 2022-04-30 DIAGNOSIS — S82891B Other fracture of right lower leg, initial encounter for open fracture type I or II: Secondary | ICD-10-CM | POA: Diagnosis not present

## 2022-04-30 DIAGNOSIS — R262 Difficulty in walking, not elsewhere classified: Secondary | ICD-10-CM | POA: Diagnosis not present

## 2022-05-01 ENCOUNTER — Encounter: Payer: Self-pay | Admitting: Adult Health

## 2022-05-01 ENCOUNTER — Non-Acute Institutional Stay (SKILLED_NURSING_FACILITY): Payer: Medicare Other | Admitting: Adult Health

## 2022-05-01 DIAGNOSIS — R32 Unspecified urinary incontinence: Secondary | ICD-10-CM

## 2022-05-01 DIAGNOSIS — R262 Difficulty in walking, not elsewhere classified: Secondary | ICD-10-CM | POA: Diagnosis not present

## 2022-05-01 DIAGNOSIS — S82841D Displaced bimalleolar fracture of right lower leg, subsequent encounter for closed fracture with routine healing: Secondary | ICD-10-CM | POA: Diagnosis not present

## 2022-05-01 DIAGNOSIS — F01A0 Vascular dementia, mild, without behavioral disturbance, psychotic disturbance, mood disturbance, and anxiety: Secondary | ICD-10-CM | POA: Diagnosis not present

## 2022-05-01 DIAGNOSIS — R41841 Cognitive communication deficit: Secondary | ICD-10-CM | POA: Diagnosis not present

## 2022-05-01 DIAGNOSIS — N3281 Overactive bladder: Secondary | ICD-10-CM | POA: Diagnosis not present

## 2022-05-01 DIAGNOSIS — Z66 Do not resuscitate: Secondary | ICD-10-CM | POA: Diagnosis not present

## 2022-05-01 DIAGNOSIS — S82891B Other fracture of right lower leg, initial encounter for open fracture type I or II: Secondary | ICD-10-CM | POA: Diagnosis not present

## 2022-05-01 DIAGNOSIS — S82891D Other fracture of right lower leg, subsequent encounter for closed fracture with routine healing: Secondary | ICD-10-CM | POA: Diagnosis not present

## 2022-05-01 DIAGNOSIS — N39498 Other specified urinary incontinence: Secondary | ICD-10-CM | POA: Diagnosis not present

## 2022-05-01 NOTE — Progress Notes (Unsigned)
Location:  [facility] Room Number: [place] of Service:  SNF [phone]) Provider: Royal Hawthorn, NP   Patient Care Team: Virgie Dad, MD as PCP - General (Internal Medicine) Pixie Casino, MD as PCP - Cardiology (Cardiology) Pieter Partridge, DO as Consulting Physician (Neurology)  Extended Emergency Contact Information Primary Emergency Contact: [name],[name] Address: [place], [place] [address] Johnnette Litter of [place] Phone: [phone] Mobile Phone: [phone] Relation: Daughter Secondary Emergency Contact: Eula Listen Address: [address]          [place], [place] [address] Johnnette Litter of [place] Mobile Phone: [phone] Relation: Son  Code Status:  DNR Goals of care: Advanced Directive information    05/01/2022   12:01 PM  Advanced Directives  Does Patient Have a Medical Advance Directive? Yes  Type of Paramedic of [place];Living will;Out of facility DNR (pink MOST or yellow form)  Does patient want to make changes to medical advance directive? No - Patient declined  Copy of [place] in Chart? Yes - validated most recent copy scanned in chart (See row information)  Pre-existing out of facility DNR order (yellow form or pink MOST form) Yellow form placed in chart (order not valid for inpatient use)     Chief Complaint  Patient presents with   Acute Visit    Incontinence management     HPI:  Pt is a 89 y.o. female seen today for an acute visit for incontinence management   Hx of incontinence of urine and bowel.  On 04/26/22: Gemtesa increased to 75 mg, FR decreased to 1500 cc, double void qhs as well due to reports of wet briefs, frequency at night.  Has had prior bladder scan with no residual  The staff have been keeping a log of her intake and output and incontinence episodes for the past few days.  She voids 1-2 times per night. She walks with staff  with stand by assistance only. She has occasional bowel incontinence and asks for help with cleaning. Staff try to encourage her to do it on her own as we are trying to progress her back to AL. She is able to help but at times does not appear to have adequate hygiene.   Ankle fracture: mechanical fall 01/30/22 due to ankle pain, Xray of the right ankle showed acute displaced distal fibular fracture and non displaced medial malleolar fracture and possible small displaced posterior malleolar fracture.  Followed by ortho. Still in skilled rehab. Making progress per therapy.   She has been out of the cam boot and doing well, ambulating with a walker. Denies any pain, has slight residual edema on the right.     Past Medical History:  Diagnosis Date   Allergy    seasonal   Anemia    Anxiety    Arthritis    Back   Asymptomatic menopausal state    Atrial fibrillation ([facility])    Cancer ([facility]) 2006   Colon.  Basal Cell Skin cancer- right arm   Cataract    removed bilateral   Chronic atrial fibrillation ([facility])    Colon cancer ([facility])    Constipation due to pain medication therapy    after heart surgery   Deficiency of other specified B group vitamins    Diplopia    Disorientation, unspecified    Dysrhythmia    PAF   GERD (gastroesophageal reflux disease)  Heart murmur    Hyperlipidemia    Hypertension    Hypothyroidism    Malignant neoplasm of colon, unspecified ([facility])    Other specified diseases of blood and blood-forming organs    Other specified disorders of bone density and structure, unspecified forearm    Other thrombophilia ([facility])    per matrix   Overactive bladder    per Matrix   Personal history of other malignant neoplasm of large intestine    Presbycusis, bilateral    per matrix   RA (rheumatoid arthritis) ([facility])    Restless leg    Seizures ([facility])    after Heart Surgey   Stroke ([facility])    TIA- found by neurologist after    Valgus deformity, not elsewhere classified, right  knee    per matrix   Vascular dementia, mild, without behavioral disturbance, psychotic disturbance, mood disturbance, and anxiety ([facility])    per matrix   Past Surgical History:  Procedure Laterality Date   ABDOMINAL HYSTERECTOMY  1970   Partial    COLON RESECTION  2006   cancer   COLON SURGERY     COLONOSCOPY     EYE SURGERY Bilateral    Cataract   MAXIMUM ACCESS (MAS)POSTERIOR LUMBAR INTERBODY FUSION (PLIF) 2 LEVEL N/A 04/12/2015   Procedure: Lumbar Three-Five Decompression, Pedicle Screw Fixation, and Posteriolateral Arthrodesis;  Surgeon: Erline Levine, MD;  Location: [facility];  Service: Neurosurgery;  Laterality: N/A;  L3-4 L4-5 Maximum access posterior lumbar fusion, possible interbodies and resection of synovial cyst at L4-5   MITRAL VALVE REPAIR  01/20/2013   Gore-tex cords to P1, P2, and P3. Magic suture to posterior medial commisure, #30 Physio 1 ring. Done in [place]   TONSILLECTOMY     about [place]  01/20/2013   #28 TriAd ring done in [place]    Allergies  Allergen Reactions   Claritin [Loratadine] Other (See Comments)    Listed on MAR Unknown reaction   Codeine Other (See Comments)    "just don't take it well"   Gabapentin Other (See Comments)    Dizziness    Molds & Smuts Other (See Comments)    Dust.  Reaction is not listed on MAR    Pollen Extract Swelling    Grass   Bee Venom Swelling and Rash   Wasp Venom Swelling and Rash    Outpatient Encounter Medications as of 05/01/2022  Medication Sig   acetaminophen (TYLENOL) 500 MG tablet Take 1,000 mg by mouth 2 (two) times daily as needed for mild pain.   atorvastatin (LIPITOR) 20 MG tablet TAKE 1 TABLET DAILY   Cholecalciferol (VITAMIN D) 50 MCG (2000 UT) CAPS Take 2,000 Units by mouth in the morning.   cloNIDine (CATAPRES) 0.1 MG tablet Take 1 tablet (0.1 mg total) by mouth daily as needed. If SBP >180 only   digoxin (LANOXIN) 0.125 MG tablet TAKE 1 TABLET DAILY   Iron, Ferrous  Sulfate, 325 (65 Fe) MG TABS Take 325 mg by mouth daily.   leflunomide (ARAVA) 20 MG tablet Take 20 mg by mouth in the morning.   levalbuterol (XOPENEX) 0.63 MG/3ML nebulizer solution Take 3 mLs (0.63 mg total) by nebulization in the morning and at bedtime.   levothyroxine (SYNTHROID) 100 MCG tablet Take 1 tablet (100 mcg total) by mouth daily before breakfast.   melatonin 3 MG TABS tablet Take 1 tablet (3 mg total) by mouth at bedtime as needed.   metoprolol succinate (TOPROL-XL) 50 MG  24 hr tablet TAKE 1 TABLET DAILY   mirtazapine (REMERON) 15 MG tablet TAKE 1 TABLET AT BEDTIME   Multiple Vitamin (MULTIVITAMIN) tablet Take 1 tablet by mouth in the morning.   Multiple Vitamins-Minerals (PRESERVISION AREDS 2) CAPS Take 1 capsule by mouth 2 (two) times daily.   NON FORMULARY Fluid restriction 1800cc daily  1st- 900 cc 2nd- 700 cc 3rd- 200 cc   omeprazole (PRILOSEC) 40 MG capsule TAKE 1 CAPSULE DAILY   ondansetron (ZOFRAN) 4 MG tablet Take 4 mg by mouth every 6 (six) hours as needed for nausea or vomiting.   sodium chloride 1 g tablet Take 1 g by mouth daily.   valsartan (DIOVAN) 80 MG tablet Take 60 mg by mouth at bedtime. Report if BP is not consistently <150/90 per MAR   Vibegron (GEMTESA) 75 MG TABS Take 75 mg by mouth daily.   vitamin B-12 (CYANOCOBALAMIN) 1000 MCG tablet Take 1,000 mcg by mouth in the morning.   [DISCONTINUED] hydrocortisone (ANUSOL-HC) 25 MG suppository Place 1 suppository (25 mg total) rectally daily.   [DISCONTINUED] sodium chloride 1 g tablet Take 1 tablet (1 g total) by mouth 2 (two) times daily with a meal. (Patient taking differently: Take 1 g by mouth once.)   [DISCONTINUED] Vibegron (GEMTESA) 75 MG TABS Take 32.5 mg by mouth at bedtime. (Patient taking differently: Take 75 mg by mouth at bedtime.)   No facility-administered encounter medications on file as of 05/01/2022.    Review of Systems  Constitutional:  Negative for activity change, appetite change,  chills, diaphoresis, fatigue, fever and unexpected weight change.  HENT:  Negative for congestion.   Respiratory:  Negative for cough, shortness of breath and wheezing.   Cardiovascular:  Negative for chest pain, palpitations and leg swelling.  Gastrointestinal:  Negative for abdominal distention, abdominal pain, constipation and diarrhea.  Genitourinary:  Positive for frequency (improved) and urgency (occasional). Negative for decreased urine volume, difficulty urinating, dysuria and flank pain.  Musculoskeletal:  Positive for arthralgias (RA) and gait problem. Negative for back pain, joint swelling and myalgias.  Neurological:  Negative for dizziness, tremors, seizures, syncope, facial asymmetry, speech difficulty, weakness, light-headedness, numbness and headaches.  Psychiatric/Behavioral:  Negative for agitation, behavioral problems and confusion (memory loss).     Immunization History  Administered Date(s) Administered   Fluad Quad(high Dose 65+) 01/05/2019, 12/07/2021   H1N1 04/07/2008   Influenza Whole 12/29/2008, 12/22/2009   Influenza, High Dose Seasonal PF 01/15/2012, 12/23/2014, 01/22/2020   Influenza,inj,Quad PF,6+ Mos 01/22/2014, 12/10/2016, 01/17/2018   Influenza,trivalent, recombinat, inj, PF 01/05/2016   Influenza-Unspecified 04/07/2008, 12/15/2014, 01/13/2021   Moderna Covid-19 Vaccine Bivalent Booster 28yr & up 01/29/2022   Moderna Sars-Covid-2 Vaccination 04/07/2019, 05/05/2019, 02/09/2020   PFIZER(Purple Top)SARS-COV-2 Vaccination 01/06/2021   PPD Test 07/17/2010, 04/19/2015, 04/25/2015   Pneumococcal Conjugate-13 11/10/2013   Pneumococcal Polysaccharide-23 12/27/2010, 12/05/2016   RSV,unspecified 03/28/2022   Tdap 04/15/2017   Zoster Recombinat (Shingrix) 04/09/2017, 06/18/2017   Pertinent  Health Maintenance Due  Topic Date Due   INFLUENZA VACCINE  Completed   DEXA SCAN  Completed      03/09/2022   12:00 AM 03/09/2022    2:00 PM 03/09/2022    9:32 PM  03/10/2022    7:14 AM 03/22/2022   11:45 AM  Fall Risk  Falls in the past year?     1  Was there an injury with Fall?     1  Fall Risk Category Calculator     3  Fall Risk Category (Retired)  High  (RETIRED) Patient Fall Risk Level High fall risk High fall risk High fall risk High fall risk   Patient at Risk for Falls Due to     History of fall(s)  Fall risk Follow up     Falls evaluation completed   Functional Status Survey:    Vitals:   05/01/22 1147  BP: 129/65  Pulse: 65  Resp: 14  Temp: 98.7 F (37.1 C)  SpO2: 93%  Weight: 125 lb 6.4 oz (56.9 kg)  Height: '5\' 2"'$  (1.575 m)   Body mass index is 22.94 kg/m. Physical Exam Vitals and nursing note reviewed.  Constitutional:      General: She is not in acute distress.    Appearance: She is not diaphoretic.  HENT:     Head: Normocephalic and atraumatic.  Neck:     Vascular: No JVD.  Cardiovascular:     Rate and Rhythm: Normal rate. Rhythm irregular.     Heart sounds: No murmur heard. Pulmonary:     Effort: Pulmonary effort is normal. No respiratory distress.     Breath sounds: Normal breath sounds. No wheezing.  Abdominal:     General: Bowel sounds are normal. There is no distension.     Palpations: Abdomen is soft.     Tenderness: There is no abdominal tenderness. There is no right CVA tenderness or left CVA tenderness.  Musculoskeletal:        General: No tenderness, deformity or signs of injury.     Right lower leg: Edema (+1) present.     Left lower leg: No edema.  Skin:    General: Skin is warm and dry.  Neurological:     Mental Status: She is alert and oriented to person, place, and time.     Labs reviewed: Recent Labs    12/07/21 0548 12/20/21 2238 01/30/22 1809 02/03/22 0000 03/08/22 1105 03/09/22 0711 03/10/22 0820 03/15/22 0000  NA 137   < > 132*   < > 127* 125* 131* 132*  K 4.2   < > 4.3   < > 3.9 4.2 4.2 4.7  CL 104   < > 100   < > 92* 91* 98 100  CO2 23   < > 22   < > '24 26 26 '$ 23*   GLUCOSE 90   < > 116*   < > 138* 99 97  --   BUN 15   < > 22   < > 12 24* 26* 16  CREATININE 0.79   < > 0.88   < > 0.52 0.83 0.75 0.7  CALCIUM 9.1   < > 9.4   < > 8.6* 8.5* 8.9 8.8  MG 1.6*  --  1.9  --   --   --   --   --    < > = values in this interval not displayed.   Recent Labs    10/03/21 1055 03/06/22 1306 03/07/22 0100  AST '22 18 22  '$ ALT '18 17 16  '$ ALKPHOS 75 76 66  BILITOT 0.8 0.6 0.7  PROT 7.1 6.6 5.8*  ALBUMIN 4.5 3.8 3.1*   Recent Labs    01/30/22 1809 02/03/22 0000 03/06/22 1306 03/06/22 1930 03/09/22 0710 03/10/22 0820 03/15/22 0000 04/17/22 1128  WBC 7.6   < > 9.4   < > 11.4* 10.1 6.2 7.5  NEUTROABS 4.9  --  6.9  --   --   --   --  4.8  HGB 11.2*   < >  8.3*   < > 9.6* 10.1* 9.3* 11.3*  HCT 33.8*   < > 25.4*   < > 28.6* 30.5* 28* 34.3*  MCV 106.6*  --  104.1*   < > 100.0 102.0*  --  100.9*  PLT 194   < > 193   < > 212 222 246 186   < > = values in this interval not displayed.   Lab Results  Component Value Date   TSH 3.519 04/17/2022   Lab Results  Component Value Date   HGBA1C 5.5 01/12/2013   Lab Results  Component Value Date   CHOL 115 02/02/2021   HDL 48 02/02/2021   LDLCALC 49 02/02/2021   TRIG 91 02/02/2021    Significant Diagnostic Results in last 30 days:  No results found.  Assessment/Plan 1. Do not resuscitate Order updated - Do not attempt resuscitation (DNR)  2. Overactive bladder Improvement in frequency no signs of retention Keep Gemtesa at 75   3. Urinary incontinence, unspecified type This was the major issue as she needs to be able to  manage this issue in AL independently.  It does appear she has made some improvement   4. Closed bimalleolar fracture of right ankle with routine healing, subsequent encounter Followed by ortho Improving      Family/ staff Communication: ***  Labs/tests ordered:  ***

## 2022-05-02 DIAGNOSIS — R41841 Cognitive communication deficit: Secondary | ICD-10-CM | POA: Diagnosis not present

## 2022-05-02 DIAGNOSIS — S82891B Other fracture of right lower leg, initial encounter for open fracture type I or II: Secondary | ICD-10-CM | POA: Diagnosis not present

## 2022-05-02 DIAGNOSIS — S82891D Other fracture of right lower leg, subsequent encounter for closed fracture with routine healing: Secondary | ICD-10-CM | POA: Diagnosis not present

## 2022-05-02 DIAGNOSIS — R262 Difficulty in walking, not elsewhere classified: Secondary | ICD-10-CM | POA: Diagnosis not present

## 2022-05-02 DIAGNOSIS — N39498 Other specified urinary incontinence: Secondary | ICD-10-CM | POA: Diagnosis not present

## 2022-05-02 DIAGNOSIS — F01A0 Vascular dementia, mild, without behavioral disturbance, psychotic disturbance, mood disturbance, and anxiety: Secondary | ICD-10-CM | POA: Diagnosis not present

## 2022-05-03 ENCOUNTER — Encounter: Payer: Self-pay | Admitting: Adult Health

## 2022-05-03 DIAGNOSIS — S82891D Other fracture of right lower leg, subsequent encounter for closed fracture with routine healing: Secondary | ICD-10-CM | POA: Diagnosis not present

## 2022-05-03 DIAGNOSIS — F01A0 Vascular dementia, mild, without behavioral disturbance, psychotic disturbance, mood disturbance, and anxiety: Secondary | ICD-10-CM | POA: Diagnosis not present

## 2022-05-03 DIAGNOSIS — N39498 Other specified urinary incontinence: Secondary | ICD-10-CM | POA: Diagnosis not present

## 2022-05-03 DIAGNOSIS — S82891B Other fracture of right lower leg, initial encounter for open fracture type I or II: Secondary | ICD-10-CM | POA: Diagnosis not present

## 2022-05-03 DIAGNOSIS — R41841 Cognitive communication deficit: Secondary | ICD-10-CM | POA: Diagnosis not present

## 2022-05-03 DIAGNOSIS — R262 Difficulty in walking, not elsewhere classified: Secondary | ICD-10-CM | POA: Diagnosis not present

## 2022-05-04 DIAGNOSIS — S82891B Other fracture of right lower leg, initial encounter for open fracture type I or II: Secondary | ICD-10-CM | POA: Diagnosis not present

## 2022-05-04 DIAGNOSIS — S82891D Other fracture of right lower leg, subsequent encounter for closed fracture with routine healing: Secondary | ICD-10-CM | POA: Diagnosis not present

## 2022-05-04 DIAGNOSIS — N39498 Other specified urinary incontinence: Secondary | ICD-10-CM | POA: Diagnosis not present

## 2022-05-04 DIAGNOSIS — R262 Difficulty in walking, not elsewhere classified: Secondary | ICD-10-CM | POA: Diagnosis not present

## 2022-05-04 DIAGNOSIS — F01A0 Vascular dementia, mild, without behavioral disturbance, psychotic disturbance, mood disturbance, and anxiety: Secondary | ICD-10-CM | POA: Diagnosis not present

## 2022-05-04 DIAGNOSIS — R41841 Cognitive communication deficit: Secondary | ICD-10-CM | POA: Diagnosis not present

## 2022-05-07 DIAGNOSIS — N39498 Other specified urinary incontinence: Secondary | ICD-10-CM | POA: Diagnosis not present

## 2022-05-07 DIAGNOSIS — F01A0 Vascular dementia, mild, without behavioral disturbance, psychotic disturbance, mood disturbance, and anxiety: Secondary | ICD-10-CM | POA: Diagnosis not present

## 2022-05-07 DIAGNOSIS — S82891B Other fracture of right lower leg, initial encounter for open fracture type I or II: Secondary | ICD-10-CM | POA: Diagnosis not present

## 2022-05-07 DIAGNOSIS — S82891D Other fracture of right lower leg, subsequent encounter for closed fracture with routine healing: Secondary | ICD-10-CM | POA: Diagnosis not present

## 2022-05-07 DIAGNOSIS — R262 Difficulty in walking, not elsewhere classified: Secondary | ICD-10-CM | POA: Diagnosis not present

## 2022-05-07 DIAGNOSIS — R41841 Cognitive communication deficit: Secondary | ICD-10-CM | POA: Diagnosis not present

## 2022-05-08 ENCOUNTER — Non-Acute Institutional Stay (SKILLED_NURSING_FACILITY): Payer: Medicare Other | Admitting: Orthopedic Surgery

## 2022-05-08 ENCOUNTER — Ambulatory Visit: Payer: Medicare Other | Admitting: Physician Assistant

## 2022-05-08 ENCOUNTER — Encounter: Payer: Self-pay | Admitting: Orthopedic Surgery

## 2022-05-08 DIAGNOSIS — E871 Hypo-osmolality and hyponatremia: Secondary | ICD-10-CM | POA: Diagnosis not present

## 2022-05-08 DIAGNOSIS — N3281 Overactive bladder: Secondary | ICD-10-CM

## 2022-05-08 DIAGNOSIS — E039 Hypothyroidism, unspecified: Secondary | ICD-10-CM | POA: Diagnosis not present

## 2022-05-08 DIAGNOSIS — G3184 Mild cognitive impairment, so stated: Secondary | ICD-10-CM

## 2022-05-08 DIAGNOSIS — I10 Essential (primary) hypertension: Secondary | ICD-10-CM

## 2022-05-08 DIAGNOSIS — M06049 Rheumatoid arthritis without rheumatoid factor, unspecified hand: Secondary | ICD-10-CM | POA: Diagnosis not present

## 2022-05-08 DIAGNOSIS — S82841D Displaced bimalleolar fracture of right lower leg, subsequent encounter for closed fracture with routine healing: Secondary | ICD-10-CM

## 2022-05-08 DIAGNOSIS — K625 Hemorrhage of anus and rectum: Secondary | ICD-10-CM

## 2022-05-08 DIAGNOSIS — R32 Unspecified urinary incontinence: Secondary | ICD-10-CM

## 2022-05-08 DIAGNOSIS — R262 Difficulty in walking, not elsewhere classified: Secondary | ICD-10-CM | POA: Diagnosis not present

## 2022-05-08 DIAGNOSIS — I4821 Permanent atrial fibrillation: Secondary | ICD-10-CM | POA: Diagnosis not present

## 2022-05-08 DIAGNOSIS — D500 Iron deficiency anemia secondary to blood loss (chronic): Secondary | ICD-10-CM

## 2022-05-08 DIAGNOSIS — F01A0 Vascular dementia, mild, without behavioral disturbance, psychotic disturbance, mood disturbance, and anxiety: Secondary | ICD-10-CM | POA: Diagnosis not present

## 2022-05-08 DIAGNOSIS — S82891D Other fracture of right lower leg, subsequent encounter for closed fracture with routine healing: Secondary | ICD-10-CM | POA: Diagnosis not present

## 2022-05-08 DIAGNOSIS — S82891B Other fracture of right lower leg, initial encounter for open fracture type I or II: Secondary | ICD-10-CM | POA: Diagnosis not present

## 2022-05-08 DIAGNOSIS — N39498 Other specified urinary incontinence: Secondary | ICD-10-CM | POA: Diagnosis not present

## 2022-05-08 DIAGNOSIS — R41841 Cognitive communication deficit: Secondary | ICD-10-CM | POA: Diagnosis not present

## 2022-05-08 NOTE — Progress Notes (Signed)
Location:  [facility] Room Number: 152/A Place of Service:  SNF [id number]) Provider:  Yvonna Alanis, NP   Virgie Dad, MD  Patient Care Team: Virgie Dad, MD as PCP - General (Internal Medicine) Pixie Casino, MD as PCP - Cardiology (Cardiology) Pieter Partridge, DO as Consulting Physician (Neurology)  Extended Emergency Contact Information Primary Emergency Contact: [name],[name] Address: [address]          [place], [place] [address] Johnnette Litter of [place] Phone: [phone] Mobile Phone: [phone] Relation: Daughter Secondary Emergency Contact: Eula Listen Address: [address]          [place], [place] [address] Johnnette Litter of [place] Mobile Phone: [phone] Relation: Son  Code Status:  DNR Goals of care: Advanced Directive information    05/01/2022   12:01 PM  Advanced Directives  Does Patient Have a Medical Advance Directive? Yes  Type of Paramedic of [place];Living will;Out of facility DNR (pink MOST or yellow form)  Does patient want to make changes to medical advance directive? No - Patient declined  Copy of [place] in Chart? Yes - validated most recent copy scanned in chart (See row information)  Pre-existing out of facility DNR order (yellow form or pink MOST form) Yellow form placed in chart (order not valid for inpatient use)     Chief Complaint  Patient presents with   Discharge Note    Discharge from [facility] rehabilitation unit    HPI:  Pt is a 89 y.o. female seen today for discharge evaluation.   She currently resides on the rehabilitation unit at [facility]. PMH: afib, aortic atherosclerosis, HTN, carotid stenosis, tricuspid valve surgery 2014, TIA, neurocognitive disorder due to vascular disease, interstitial lung disease, colon cancer, diverticulosis, GERD, GI Bleed, hypothyroidism, RA, osteopenia, OAB, anemia and depression.   Hospitalized 12/12- 12/16  due to rectal bleeding and UTI. Hgb dropped to 7.5. thought to be diverticular bleeding and not recurrence of colon cancer. CEA was mildly elevated. She received 1 unit PRBC. Urine culture positive for Klebsiella, resolved with cefadroxil. She denies rectal bleeding, abdominal pain, N/V today. She is having normal bowel movements. Hgb 11.3 04/17/2022.   Prior to hospitalization, she had a mechanical fall 01/30/2022 and fractured right ankle. Xray revealed acute displaced distal fibular fracture and non displaced medical malleolar fracture with possible small displaced posterior malleolar fracture. Followed by Dr. Doran Durand at [facility]. She wore cam boot for awhile but not using anymore. She continue to work with PT.  Surgical intervention has been recommended> family to give answer next appointment 05/2022.  She denies right ankle pain today. Ambulating well with FWW.   She continues to have urinary incontinence. 02/01 Gemtesa increased to 75 mg. She continues to have increased frequency at night. Last bladder scan with no residual.   MMSE 22/30 12/2021. Followed by Dr. Tomi Likens. No behaviors or increased confusion at this time.   Remains on Yancey for RA, Embrel discontinued.   Eliquis discontinued due to recurrent falls, now on aspirin for clot prevention.   She plans to transfer back to assisted living today. She denies chest pain, sob or cold symptoms. Appetite is fair. No recent falls or injuries. PT will follow her in AL. She is scheduled to be seen in clinic in 2 weeks.   Past Medical History:  Diagnosis Date   Allergy    seasonal   Anemia    Anxiety    Arthritis  Back   Asymptomatic menopausal state    Atrial fibrillation ([place])    Cancer ([place]) 2006   Colon.  Basal Cell Skin cancer- right arm   Cataract    removed bilateral   Chronic atrial fibrillation (HCC)    Colon cancer (HCC)    Constipation due to pain medication therapy    after heart surgery   Deficiency of other  specified B group vitamins    Diplopia    Disorientation, unspecified    Dysrhythmia    PAF   GERD (gastroesophageal reflux disease)    Heart murmur    Hyperlipidemia    Hypertension    Hypothyroidism    Malignant neoplasm of colon, unspecified ([place])    Other specified diseases of blood and blood-forming organs    Other specified disorders of bone density and structure, unspecified forearm    Other thrombophilia ([place])    per matrix   Overactive bladder    per Matrix   Personal history of other malignant neoplasm of large intestine    Presbycusis, bilateral    per matrix   RA (rheumatoid arthritis) ([place])    Restless leg    Seizures ([place])    after Heart Surgey   Stroke ([place])    TIA- found by neurologist after    Valgus deformity, not elsewhere classified, right knee    per matrix   Vascular dementia, mild, without behavioral disturbance, psychotic disturbance, mood disturbance, and anxiety ([place])    per matrix   Past Surgical History:  Procedure Laterality Date   ABDOMINAL HYSTERECTOMY  1970   Partial    COLON RESECTION  2006   cancer   COLON SURGERY     COLONOSCOPY     EYE SURGERY Bilateral    Cataract   MAXIMUM ACCESS (MAS)POSTERIOR LUMBAR INTERBODY FUSION (PLIF) 2 LEVEL N/A 04/12/2015   Procedure: Lumbar Three-Five Decompression, Pedicle Screw Fixation, and Posteriolateral Arthrodesis;  Surgeon: Erline Levine, MD;  Location: [facility];  Service: Neurosurgery;  Laterality: N/A;  L3-4 L4-5 Maximum access posterior lumbar fusion, possible interbodies and resection of synovial cyst at L4-5   MITRAL VALVE REPAIR  01/20/2013   Gore-tex cords to P1, P2, and P3. Magic suture to posterior medial commisure, #30 Physio 1 ring. Done in [place]   TONSILLECTOMY     about [place]  01/20/2013   #28 TriAd ring done in [place]    Allergies  Allergen Reactions   Claritin [Loratadine] Other (See Comments)    Listed on MAR Unknown reaction   Codeine Other  (See Comments)    "just don't take it well"   Gabapentin Other (See Comments)    Dizziness    Molds & Smuts Other (See Comments)    Dust.  Reaction is not listed on MAR    Pollen Extract Swelling    Grass   Bee Venom Swelling and Rash   Wasp Venom Swelling and Rash    Outpatient Encounter Medications as of 05/08/2022  Medication Sig   acetaminophen (TYLENOL) 500 MG tablet Take 1,000 mg by mouth 2 (two) times daily as needed for mild pain.   atorvastatin (LIPITOR) 20 MG tablet TAKE 1 TABLET DAILY   Cholecalciferol (VITAMIN D) 50 MCG (2000 UT) CAPS Take 2,000 Units by mouth in the morning.   cloNIDine (CATAPRES) 0.1 MG tablet Take 1 tablet (0.1 mg total) by mouth daily as needed. If SBP >180 only   digoxin (LANOXIN) 0.125 MG tablet TAKE 1 TABLET  DAILY   Iron, Ferrous Sulfate, 325 (65 Fe) MG TABS Take 325 mg by mouth daily.   leflunomide (ARAVA) 20 MG tablet Take 20 mg by mouth in the morning.   levalbuterol (XOPENEX) 0.63 MG/3ML nebulizer solution Take 3 mLs (0.63 mg total) by nebulization in the morning and at bedtime.   levothyroxine (SYNTHROID) 100 MCG tablet Take 1 tablet (100 mcg total) by mouth daily before breakfast.   melatonin 3 MG TABS tablet Take 1 tablet (3 mg total) by mouth at bedtime as needed.   metoprolol succinate (TOPROL-XL) 50 MG 24 hr tablet TAKE 1 TABLET DAILY   mirtazapine (REMERON) 15 MG tablet TAKE 1 TABLET AT BEDTIME   Multiple Vitamin (MULTIVITAMIN) tablet Take 1 tablet by mouth in the morning.   Multiple Vitamins-Minerals (PRESERVISION AREDS 2) CAPS Take 1 capsule by mouth 2 (two) times daily.   NON FORMULARY Fluid restriction 1800cc daily  1st- 900 cc 2nd- 700 cc 3rd- 200 cc   omeprazole (PRILOSEC) 40 MG capsule TAKE 1 CAPSULE DAILY   ondansetron (ZOFRAN) 4 MG tablet Take 4 mg by mouth every 6 (six) hours as needed for nausea or vomiting.   sodium chloride 1 g tablet Take 1 g by mouth daily.   valsartan (DIOVAN) 80 MG tablet Take 60 mg by mouth at  bedtime. Report if BP is not consistently <150/90 per MAR   Vibegron (GEMTESA) 75 MG TABS Take 75 mg by mouth daily.   vitamin B-12 (CYANOCOBALAMIN) 1000 MCG tablet Take 1,000 mcg by mouth in the morning.   No facility-administered encounter medications on file as of 05/08/2022.    Review of Systems  Constitutional:  Negative for activity change, appetite change, fatigue and fever.  HENT:  Negative for congestion and trouble swallowing.   Eyes:  Negative for visual disturbance.  Respiratory:  Negative for cough, shortness of breath and wheezing.   Cardiovascular:  Negative for chest pain and leg swelling.  Gastrointestinal:  Negative for abdominal distention, abdominal pain, blood in stool, constipation, diarrhea and nausea.  Genitourinary:  Negative for dysuria, frequency and hematuria.       Incontinence  Musculoskeletal:  Positive for arthralgias and gait problem.  Neurological:  Positive for weakness. Negative for dizziness and headaches.  Psychiatric/Behavioral:  Positive for confusion. Negative for dysphoric mood and sleep disturbance. The patient is not nervous/anxious.     Immunization History  Administered Date(s) Administered   Fluad Quad(high Dose 65+) 01/05/2019, 12/07/2021   H1N1 04/07/2008   Influenza Whole 12/29/2008, 12/22/2009   Influenza, High Dose Seasonal PF 01/15/2012, 12/23/2014, 01/22/2020   Influenza,inj,Quad PF,6+ Mos 01/22/2014, 12/10/2016, 01/17/2018   Influenza,trivalent, recombinat, inj, PF 01/05/2016   Influenza-Unspecified 04/07/2008, 12/15/2014, 01/13/2021   Moderna Covid-19 Vaccine Bivalent Booster 75yr & up 01/29/2022   Moderna Sars-Covid-2 Vaccination 04/07/2019, 05/05/2019, 02/09/2020   PFIZER(Purple Top)SARS-COV-2 Vaccination 01/06/2021   PPD Test 07/17/2010, 04/19/2015, 04/25/2015   Pneumococcal Conjugate-13 11/10/2013   Pneumococcal Polysaccharide-23 12/27/2010, 12/05/2016   RSV,unspecified 03/28/2022   Tdap 04/15/2017   Zoster Recombinat  (Shingrix) 04/09/2017, 06/18/2017   Pertinent  Health Maintenance Due  Topic Date Due   INFLUENZA VACCINE  Completed   DEXA SCAN  Completed      03/09/2022   12:00 AM 03/09/2022    2:00 PM 03/09/2022    9:32 PM 03/10/2022    7:14 AM 03/22/2022   11:45 AM  Fall Risk  Falls in the past year?     1  Was there an injury with Fall?  1  Fall Risk Category Calculator     3  Fall Risk Category (Retired)     High  (RETIRED) Patient Fall Risk Level High fall risk High fall risk High fall risk High fall risk   Patient at Risk for Falls Due to     History of fall(s)  Fall risk Follow up     Falls evaluation completed   Functional Status Survey:    Vitals:   05/08/22 1619  BP: (!) 141/72  Pulse: 69  Resp: 16  Temp: 97.9 F (36.6 C)  SpO2: 96%  Weight: 127 lb 6.4 oz (57.8 kg)  Height: 5' 2"$  (1.575 m)   Body mass index is 23.3 kg/m. Physical Exam Vitals reviewed.  Constitutional:      Appearance: She is obese.  HENT:     Head: Normocephalic.  Eyes:     General:        Right eye: No discharge.        Left eye: No discharge.  Cardiovascular:     Rate and Rhythm: Normal rate. Rhythm irregular.     Pulses: Normal pulses.     Heart sounds: Normal heart sounds.  Pulmonary:     Effort: Pulmonary effort is normal. No respiratory distress.     Breath sounds: Normal breath sounds. No wheezing.  Abdominal:     General: Bowel sounds are normal. There is no distension.     Palpations: There is no mass.     Tenderness: There is no abdominal tenderness. There is no guarding or rebound.     Hernia: No hernia is present.  Musculoskeletal:     Cervical back: Neck supple.     Right lower leg: No edema.     Left lower leg: No edema.  Skin:    General: Skin is warm and dry.     Capillary Refill: Capillary refill takes less than 2 seconds.  Neurological:     General: No focal deficit present.     Mental Status: She is alert. Mental status is at baseline.     Motor: Weakness  present.     Gait: Gait abnormal.     Comments: FWW  Psychiatric:        Mood and Affect: Mood normal.        Behavior: Behavior normal.     Comments: Very pleasant, follows commands, alert to self/person/place     Labs reviewed: Recent Labs    12/07/21 0548 12/20/21 2238 01/30/22 1809 02/03/22 0000 03/08/22 1105 03/09/22 0711 03/10/22 0820 03/15/22 0000  NA 137   < > 132*   < > 127* 125* 131* 132*  K 4.2   < > 4.3   < > 3.9 4.2 4.2 4.7  CL 104   < > 100   < > 92* 91* 98 100  CO2 23   < > 22   < > 24 26 26 $ 23*  GLUCOSE 90   < > 116*   < > 138* 99 97  --   BUN 15   < > 22   < > 12 24* 26* 16  CREATININE 0.79   < > 0.88   < > 0.52 0.83 0.75 0.7  CALCIUM 9.1   < > 9.4   < > 8.6* 8.5* 8.9 8.8  MG 1.6*  --  1.9  --   --   --   --   --    < > = values in this interval not displayed.  Recent Labs    10/03/21 1055 03/06/22 1306 03/07/22 0100  AST 22 18 22  $ ALT 18 17 16  $ ALKPHOS 75 76 66  BILITOT 0.8 0.6 0.7  PROT 7.1 6.6 5.8*  ALBUMIN 4.5 3.8 3.1*   Recent Labs    01/30/22 1809 02/03/22 0000 03/06/22 1306 03/06/22 1930 03/09/22 0710 03/10/22 0820 03/15/22 0000 04/17/22 1128  WBC 7.6   < > 9.4   < > 11.4* 10.1 6.2 7.5  NEUTROABS 4.9  --  6.9  --   --   --   --  4.8  HGB 11.2*   < > 8.3*   < > 9.6* 10.1* 9.3* 11.3*  HCT 33.8*   < > 25.4*   < > 28.6* 30.5* 28* 34.3*  MCV 106.6*  --  104.1*   < > 100.0 102.0*  --  100.9*  PLT 194   < > 193   < > 212 222 246 186   < > = values in this interval not displayed.   Lab Results  Component Value Date   TSH 3.519 04/17/2022   Lab Results  Component Value Date   HGBA1C 5.5 01/12/2013   Lab Results  Component Value Date   CHOL 115 02/02/2021   HDL 48 02/02/2021   LDLCALC 49 02/02/2021   TRIG 91 02/02/2021    Significant Diagnostic Results in last 30 days:  No results found.  Assessment/Plan 1. Closed bimalleolar fracture of right ankle with routine healing, subsequent encounter - 01/30/2022 mechanical fall   -  Xray revealed acute displaced distal fibular fracture and non displaced medical malleolar fracture with possible small displaced posterior malleolar fracture - followed by Dr. Hewitt> surgery recommended - off cam boot - cont PT - cont tylenol prn for pain  2. Rectal bleeding - hospitalized 12/12-12/16> hgb 7.5 - due to diverticulitis, not recurrence of colon cancer - off Eliquis  - no further episodes - hgb 11.3 04/17/2022  3. Iron deficiency anemia due to chronic blood loss - see above - cont ferrous sulfate  4. Overactive bladder - Gemtesa increased 02/01 - recent bladder scan with no residual  5. Urinary incontinence, unspecified type - see above - cont assisted living care  6. Permanent atrial fibrillation (HCC) - HR controlled with metoprolol and digoxin - off eliquis due to GI bleed and recurrent falls - cont asa  7. Essential hypertension - controlled - cont Diovan  8. Hypothyroidism, unspecified type - TSH 3.519 04/17/2022 - cont levothyroxine  9. Hyponatremia - Na+ stable> 132 03/15/2022 - cont sodium tablets  10. Rheumatoid arthritis involving hand with negative rheumatoid factor, unspecified laterality ([place]) - off [facility]  11. Mild cognitive impairment with memory loss - MMSE 20/30  - followed by Dr. Tomi Likens - no behaviors  She will discharge back to AL this afternoon with FWW. No other DME needs at this time. PT will continue sessions in [place]. F/u scheduled with [facility] 05/21/2022 in [place] clinic.   Family/ staff Communication: plan discussed with patient and nurse  Labs/tests ordered:  none

## 2022-05-09 DIAGNOSIS — S82891B Other fracture of right lower leg, initial encounter for open fracture type I or II: Secondary | ICD-10-CM | POA: Diagnosis not present

## 2022-05-09 DIAGNOSIS — R41841 Cognitive communication deficit: Secondary | ICD-10-CM | POA: Diagnosis not present

## 2022-05-09 DIAGNOSIS — S82891D Other fracture of right lower leg, subsequent encounter for closed fracture with routine healing: Secondary | ICD-10-CM | POA: Diagnosis not present

## 2022-05-09 DIAGNOSIS — F01A0 Vascular dementia, mild, without behavioral disturbance, psychotic disturbance, mood disturbance, and anxiety: Secondary | ICD-10-CM | POA: Diagnosis not present

## 2022-05-09 DIAGNOSIS — R262 Difficulty in walking, not elsewhere classified: Secondary | ICD-10-CM | POA: Diagnosis not present

## 2022-05-09 DIAGNOSIS — N39498 Other specified urinary incontinence: Secondary | ICD-10-CM | POA: Diagnosis not present

## 2022-05-10 DIAGNOSIS — S82891D Other fracture of right lower leg, subsequent encounter for closed fracture with routine healing: Secondary | ICD-10-CM | POA: Diagnosis not present

## 2022-05-10 DIAGNOSIS — N39498 Other specified urinary incontinence: Secondary | ICD-10-CM | POA: Diagnosis not present

## 2022-05-10 DIAGNOSIS — S82891B Other fracture of right lower leg, initial encounter for open fracture type I or II: Secondary | ICD-10-CM | POA: Diagnosis not present

## 2022-05-10 DIAGNOSIS — F01A0 Vascular dementia, mild, without behavioral disturbance, psychotic disturbance, mood disturbance, and anxiety: Secondary | ICD-10-CM | POA: Diagnosis not present

## 2022-05-10 DIAGNOSIS — R262 Difficulty in walking, not elsewhere classified: Secondary | ICD-10-CM | POA: Diagnosis not present

## 2022-05-10 DIAGNOSIS — R41841 Cognitive communication deficit: Secondary | ICD-10-CM | POA: Diagnosis not present

## 2022-05-11 ENCOUNTER — Ambulatory Visit: Payer: Medicare Other | Admitting: Adult Health

## 2022-05-11 DIAGNOSIS — N39498 Other specified urinary incontinence: Secondary | ICD-10-CM | POA: Diagnosis not present

## 2022-05-11 DIAGNOSIS — R262 Difficulty in walking, not elsewhere classified: Secondary | ICD-10-CM | POA: Diagnosis not present

## 2022-05-11 DIAGNOSIS — F01A0 Vascular dementia, mild, without behavioral disturbance, psychotic disturbance, mood disturbance, and anxiety: Secondary | ICD-10-CM | POA: Diagnosis not present

## 2022-05-11 DIAGNOSIS — S82891B Other fracture of right lower leg, initial encounter for open fracture type I or II: Secondary | ICD-10-CM | POA: Diagnosis not present

## 2022-05-11 DIAGNOSIS — S82891D Other fracture of right lower leg, subsequent encounter for closed fracture with routine healing: Secondary | ICD-10-CM | POA: Diagnosis not present

## 2022-05-11 DIAGNOSIS — R41841 Cognitive communication deficit: Secondary | ICD-10-CM | POA: Diagnosis not present

## 2022-05-14 DIAGNOSIS — S82891B Other fracture of right lower leg, initial encounter for open fracture type I or II: Secondary | ICD-10-CM | POA: Diagnosis not present

## 2022-05-14 DIAGNOSIS — F01A0 Vascular dementia, mild, without behavioral disturbance, psychotic disturbance, mood disturbance, and anxiety: Secondary | ICD-10-CM | POA: Diagnosis not present

## 2022-05-14 DIAGNOSIS — N39498 Other specified urinary incontinence: Secondary | ICD-10-CM | POA: Diagnosis not present

## 2022-05-14 DIAGNOSIS — R262 Difficulty in walking, not elsewhere classified: Secondary | ICD-10-CM | POA: Diagnosis not present

## 2022-05-14 DIAGNOSIS — R41841 Cognitive communication deficit: Secondary | ICD-10-CM | POA: Diagnosis not present

## 2022-05-14 DIAGNOSIS — S82891D Other fracture of right lower leg, subsequent encounter for closed fracture with routine healing: Secondary | ICD-10-CM | POA: Diagnosis not present

## 2022-05-15 DIAGNOSIS — R41841 Cognitive communication deficit: Secondary | ICD-10-CM | POA: Diagnosis not present

## 2022-05-15 DIAGNOSIS — S82891B Other fracture of right lower leg, initial encounter for open fracture type I or II: Secondary | ICD-10-CM | POA: Diagnosis not present

## 2022-05-15 DIAGNOSIS — N39498 Other specified urinary incontinence: Secondary | ICD-10-CM | POA: Diagnosis not present

## 2022-05-15 DIAGNOSIS — F01A0 Vascular dementia, mild, without behavioral disturbance, psychotic disturbance, mood disturbance, and anxiety: Secondary | ICD-10-CM | POA: Diagnosis not present

## 2022-05-15 DIAGNOSIS — S82891D Other fracture of right lower leg, subsequent encounter for closed fracture with routine healing: Secondary | ICD-10-CM | POA: Diagnosis not present

## 2022-05-15 DIAGNOSIS — R262 Difficulty in walking, not elsewhere classified: Secondary | ICD-10-CM | POA: Diagnosis not present

## 2022-05-16 DIAGNOSIS — S82891D Other fracture of right lower leg, subsequent encounter for closed fracture with routine healing: Secondary | ICD-10-CM | POA: Diagnosis not present

## 2022-05-16 DIAGNOSIS — R262 Difficulty in walking, not elsewhere classified: Secondary | ICD-10-CM | POA: Diagnosis not present

## 2022-05-16 DIAGNOSIS — F01A0 Vascular dementia, mild, without behavioral disturbance, psychotic disturbance, mood disturbance, and anxiety: Secondary | ICD-10-CM | POA: Diagnosis not present

## 2022-05-16 DIAGNOSIS — R41841 Cognitive communication deficit: Secondary | ICD-10-CM | POA: Diagnosis not present

## 2022-05-16 DIAGNOSIS — S82891B Other fracture of right lower leg, initial encounter for open fracture type I or II: Secondary | ICD-10-CM | POA: Diagnosis not present

## 2022-05-16 DIAGNOSIS — N39498 Other specified urinary incontinence: Secondary | ICD-10-CM | POA: Diagnosis not present

## 2022-05-17 DIAGNOSIS — N39498 Other specified urinary incontinence: Secondary | ICD-10-CM | POA: Diagnosis not present

## 2022-05-17 DIAGNOSIS — F01A0 Vascular dementia, mild, without behavioral disturbance, psychotic disturbance, mood disturbance, and anxiety: Secondary | ICD-10-CM | POA: Diagnosis not present

## 2022-05-17 DIAGNOSIS — S82891B Other fracture of right lower leg, initial encounter for open fracture type I or II: Secondary | ICD-10-CM | POA: Diagnosis not present

## 2022-05-17 DIAGNOSIS — R262 Difficulty in walking, not elsewhere classified: Secondary | ICD-10-CM | POA: Diagnosis not present

## 2022-05-17 DIAGNOSIS — S82891D Other fracture of right lower leg, subsequent encounter for closed fracture with routine healing: Secondary | ICD-10-CM | POA: Diagnosis not present

## 2022-05-17 DIAGNOSIS — R41841 Cognitive communication deficit: Secondary | ICD-10-CM | POA: Diagnosis not present

## 2022-05-18 DIAGNOSIS — S82891D Other fracture of right lower leg, subsequent encounter for closed fracture with routine healing: Secondary | ICD-10-CM | POA: Diagnosis not present

## 2022-05-18 DIAGNOSIS — F01A0 Vascular dementia, mild, without behavioral disturbance, psychotic disturbance, mood disturbance, and anxiety: Secondary | ICD-10-CM | POA: Diagnosis not present

## 2022-05-18 DIAGNOSIS — R41841 Cognitive communication deficit: Secondary | ICD-10-CM | POA: Diagnosis not present

## 2022-05-18 DIAGNOSIS — S82891B Other fracture of right lower leg, initial encounter for open fracture type I or II: Secondary | ICD-10-CM | POA: Diagnosis not present

## 2022-05-18 DIAGNOSIS — R262 Difficulty in walking, not elsewhere classified: Secondary | ICD-10-CM | POA: Diagnosis not present

## 2022-05-18 DIAGNOSIS — N39498 Other specified urinary incontinence: Secondary | ICD-10-CM | POA: Diagnosis not present

## 2022-05-20 DIAGNOSIS — S82891D Other fracture of right lower leg, subsequent encounter for closed fracture with routine healing: Secondary | ICD-10-CM | POA: Diagnosis not present

## 2022-05-20 DIAGNOSIS — R262 Difficulty in walking, not elsewhere classified: Secondary | ICD-10-CM | POA: Diagnosis not present

## 2022-05-20 DIAGNOSIS — N39498 Other specified urinary incontinence: Secondary | ICD-10-CM | POA: Diagnosis not present

## 2022-05-20 DIAGNOSIS — S82891B Other fracture of right lower leg, initial encounter for open fracture type I or II: Secondary | ICD-10-CM | POA: Diagnosis not present

## 2022-05-20 DIAGNOSIS — R41841 Cognitive communication deficit: Secondary | ICD-10-CM | POA: Diagnosis not present

## 2022-05-20 DIAGNOSIS — F01A0 Vascular dementia, mild, without behavioral disturbance, psychotic disturbance, mood disturbance, and anxiety: Secondary | ICD-10-CM | POA: Diagnosis not present

## 2022-05-21 ENCOUNTER — Encounter: Payer: Self-pay | Admitting: Adult Health

## 2022-05-21 ENCOUNTER — Non-Acute Institutional Stay: Payer: Medicare Other | Admitting: Adult Health

## 2022-05-21 VITALS — BP 158/82 | HR 97 | Temp 97.9°F | Resp 17 | Ht 62.0 in | Wt 127.0 lb

## 2022-05-21 DIAGNOSIS — M06049 Rheumatoid arthritis without rheumatoid factor, unspecified hand: Secondary | ICD-10-CM

## 2022-05-21 DIAGNOSIS — R41841 Cognitive communication deficit: Secondary | ICD-10-CM | POA: Diagnosis not present

## 2022-05-21 DIAGNOSIS — D509 Iron deficiency anemia, unspecified: Secondary | ICD-10-CM

## 2022-05-21 DIAGNOSIS — M25561 Pain in right knee: Secondary | ICD-10-CM

## 2022-05-21 DIAGNOSIS — I4821 Permanent atrial fibrillation: Secondary | ICD-10-CM

## 2022-05-21 DIAGNOSIS — S82841D Displaced bimalleolar fracture of right lower leg, subsequent encounter for closed fracture with routine healing: Secondary | ICD-10-CM | POA: Diagnosis not present

## 2022-05-21 DIAGNOSIS — N3281 Overactive bladder: Secondary | ICD-10-CM

## 2022-05-21 DIAGNOSIS — S82891D Other fracture of right lower leg, subsequent encounter for closed fracture with routine healing: Secondary | ICD-10-CM | POA: Diagnosis not present

## 2022-05-21 DIAGNOSIS — F01A0 Vascular dementia, mild, without behavioral disturbance, psychotic disturbance, mood disturbance, and anxiety: Secondary | ICD-10-CM | POA: Diagnosis not present

## 2022-05-21 DIAGNOSIS — S82891B Other fracture of right lower leg, initial encounter for open fracture type I or II: Secondary | ICD-10-CM | POA: Diagnosis not present

## 2022-05-21 DIAGNOSIS — I10 Essential (primary) hypertension: Secondary | ICD-10-CM | POA: Diagnosis not present

## 2022-05-21 DIAGNOSIS — R262 Difficulty in walking, not elsewhere classified: Secondary | ICD-10-CM | POA: Diagnosis not present

## 2022-05-21 DIAGNOSIS — N39498 Other specified urinary incontinence: Secondary | ICD-10-CM | POA: Diagnosis not present

## 2022-05-21 NOTE — Progress Notes (Signed)
Location:  [name]  POS: Clinic  Provider: Royal Hawthorn, ANP  Code Status: DNR Goals of Care:     05/21/2022    1:06 PM  Advanced Directives  Does Patient Have a Medical Advance Directive? Yes  Type of Paramedic of [place];Living will;Out of facility DNR (pink MOST or yellow form)  Does patient want to make changes to medical advance directive? No - Patient declined  Copy of [place] in Chart? Yes - validated most recent copy scanned in chart (See row information)  Pre-existing out of facility DNR order (yellow form or pink MOST form) Yellow form placed in chart (order not valid for inpatient use)     Chief Complaint  Patient presents with   Acute Visit    Patient is being discharged from rehab and having knee pain     HPI: Patient is a 89 y.o. female seen today for medical management of chronic diseases.  PMH: hx of colon ca in 2006 with recurrence. Noted by colonoscopy at [facility] 02/22/21, bx showed well differentiated adenocarcinoma. CT in Jan of 2023 showed no residual disease. Had 4 cycles of pembrolizumab. Colonoscopy 09/20/21 showed no residual mass.    Also hx of RA on Arava, afib off eliquis due to bleeding risk, OAB, TIA, CVA, HTN, ILD, Gerd, GIB, Hypothyroidism, OA, B12 def, mitral and tricuspid valve repair, HLD< anemia, depression, constipation.    She was discharged from skilled rehab [place] after a prolonged stay due to a mechanical fall 01/30/22 and fractured right ankle. Xray revealed acute displaced distal fibular fracture and non displaced medical malleolar fracture with possible small displaced posterior malleolar fracture. She had been in a cast and boot. Now out and ambulating with a walker.   She was also hospitalized 12/12-12/16/23 due to rectal bleeding and UTI.  Thought to be a diverticular bleed. CEA is elevated. Was transfused. Also treated for Klebsiell UTI with cefadroxil  She has had  two falls since returning to [place]. No acute injury. Right knee buckling. Going to see ortho 05/25/22 Reports knee pain 8/10  Incontinence: has urgency, sometimes she can't get to the bathroom in time. Wears a brief all the time. The brief is wet in the morning. She changes it herself. No burning or bladder pain.   Memory loss: MMSE 25/30 112/2023. Followed by Dr. Tomi Likens   Hypothyroidism: currently on synthroid Lab Results  Component Value Date   TSH 3.519 04/17/2022   Follows with Dr Benay Spice for colon ca and anemia  BP running in the 160s at times in [place] Past Medical History:  Diagnosis Date   Allergy    seasonal   Anemia    Anxiety    Arthritis    Back   Asymptomatic menopausal state    Atrial fibrillation ([place])    Cancer ([place]) 2006   Colon.  Basal Cell Skin cancer- right arm   Cataract    removed bilateral   Chronic atrial fibrillation (HCC)    Colon cancer (HCC)    Constipation due to pain medication therapy    after heart surgery   Deficiency of other specified B group vitamins    Diplopia    Disorientation, unspecified    Dysrhythmia    PAF   GERD (gastroesophageal reflux disease)    Heart murmur    Hyperlipidemia    Hypertension    Hypothyroidism    Malignant neoplasm of colon, unspecified ([place])    Other specified diseases  of blood and blood-forming organs    Other specified disorders of bone density and structure, unspecified forearm    Other thrombophilia ([place])    per matrix   Overactive bladder    per Matrix   Personal history of other malignant neoplasm of large intestine    Presbycusis, bilateral    per matrix   RA (rheumatoid arthritis) ([place])    Restless leg    Seizures ([place])    after Heart Surgey   Stroke ([place])    TIA- found by neurologist after    Valgus deformity, not elsewhere classified, right knee    per matrix   Vascular dementia, mild, without behavioral disturbance, psychotic disturbance, mood disturbance, and anxiety ([place])    per matrix     Past Surgical History:  Procedure Laterality Date   ABDOMINAL HYSTERECTOMY  1970   Partial    COLON RESECTION  2006   cancer   COLON SURGERY     COLONOSCOPY     EYE SURGERY Bilateral    Cataract   MAXIMUM ACCESS (MAS)POSTERIOR LUMBAR INTERBODY FUSION (PLIF) 2 LEVEL N/A 04/12/2015   Procedure: Lumbar Three-Five Decompression, Pedicle Screw Fixation, and Posteriolateral Arthrodesis;  Surgeon: Erline Levine, MD;  Location: [facility];  Service: Neurosurgery;  Laterality: N/A;  L3-4 L4-5 Maximum access posterior lumbar fusion, possible interbodies and resection of synovial cyst at L4-5   MITRAL VALVE REPAIR  01/20/2013   Gore-tex cords to P1, P2, and P3. Magic suture to posterior medial commisure, #30 Physio 1 ring. Done in [place]   TONSILLECTOMY     about [place]  01/20/2013   #28 TriAd ring done in [place]    Allergies  Allergen Reactions   Claritin [Loratadine] Other (See Comments)    Listed on MAR Unknown reaction   Codeine Other (See Comments)    "just don't take it well"   Gabapentin Other (See Comments)    Dizziness    Molds & Smuts Other (See Comments)    Dust.  Reaction is not listed on MAR    Pollen Extract Swelling    Grass   Bee Venom Swelling and Rash   Wasp Venom Swelling and Rash    Outpatient Encounter Medications as of 05/21/2022  Medication Sig   acetaminophen (TYLENOL) 500 MG tablet Take 1,000 mg by mouth 2 (two) times daily as needed for mild pain.   atorvastatin (LIPITOR) 20 MG tablet TAKE 1 TABLET DAILY   Cholecalciferol (VITAMIN D) 50 MCG (2000 UT) CAPS Take 2,000 Units by mouth in the morning.   cloNIDine (CATAPRES) 0.1 MG tablet Take 1 tablet (0.1 mg total) by mouth daily as needed. If SBP >180 only   digoxin (LANOXIN) 0.125 MG tablet TAKE 1 TABLET DAILY   Iron, Ferrous Sulfate, 325 (65 Fe) MG TABS Take 325 mg by mouth daily.   leflunomide (ARAVA) 20 MG tablet Take 20 mg by mouth in the morning.   levalbuterol (XOPENEX)  0.63 MG/3ML nebulizer solution Take 3 mLs (0.63 mg total) by nebulization in the morning and at bedtime.   levothyroxine (SYNTHROID) 100 MCG tablet Take 1 tablet (100 mcg total) by mouth daily before breakfast.   melatonin 3 MG TABS tablet Take 1 tablet (3 mg total) by mouth at bedtime as needed.   metoprolol succinate (TOPROL-XL) 50 MG 24 hr tablet TAKE 1 TABLET DAILY   mirtazapine (REMERON) 15 MG tablet TAKE 1 TABLET AT BEDTIME   Multiple Vitamin (MULTIVITAMIN) tablet Take 1 tablet  by mouth in the morning.   Multiple Vitamins-Minerals (PRESERVISION AREDS 2) CAPS Take 1 capsule by mouth 2 (two) times daily.   NON FORMULARY Fluid restriction 1800cc daily  1st- 900 cc 2nd- 700 cc 3rd- 200 cc   omeprazole (PRILOSEC) 40 MG capsule TAKE 1 CAPSULE DAILY   ondansetron (ZOFRAN) 4 MG tablet Take 4 mg by mouth every 6 (six) hours as needed for nausea or vomiting.   sodium chloride 1 g tablet Take 1 g by mouth daily.   valsartan (DIOVAN) 80 MG tablet Take 60 mg by mouth at bedtime. Report if BP is not consistently <150/90 per MAR   Vibegron (GEMTESA) 75 MG TABS Take 75 mg by mouth daily.   vitamin B-12 (CYANOCOBALAMIN) 1000 MCG tablet Take 1,000 mcg by mouth in the morning.   No facility-administered encounter medications on file as of 05/21/2022.    Review of Systems:  Review of Systems  Constitutional:  Negative for activity change, appetite change, chills, diaphoresis, fatigue, fever and unexpected weight change.  HENT:  Negative for congestion.   Respiratory:  Negative for cough, shortness of breath and wheezing.   Cardiovascular:  Positive for leg swelling. Negative for chest pain and palpitations.  Gastrointestinal:  Negative for abdominal distention, abdominal pain, constipation and diarrhea.  Genitourinary:  Negative for difficulty urinating and dysuria.  Musculoskeletal:  Positive for arthralgias and gait problem (walker). Negative for back pain, joint swelling and myalgias.       Right  knee buckling with pain  Neurological:  Negative for dizziness, tremors, seizures, syncope, facial asymmetry, speech difficulty, weakness, light-headedness, numbness and headaches.  Psychiatric/Behavioral:  Negative for agitation, behavioral problems and confusion.        Memory loss  All other systems reviewed and are negative.   Health Maintenance  Topic Date Due   COVID-19 Vaccine (6 - 2023-24 season) 06/02/2022 (Originally 03/26/2022)   DTaP/Tdap/Td (2 - Td or Tdap) 04/16/2027   Pneumonia Vaccine 65+ Years old  Completed   INFLUENZA VACCINE  Completed   DEXA SCAN  Completed   Zoster Vaccines- Shingrix  Completed   HPV VACCINES  Aged Out    Physical Exam: Vitals:   05/21/22 1309 05/21/22 1311  BP: (!) 154/82 (!) 158/82  Pulse: 97   Resp: 17   Temp: 97.9 F (36.6 C)   TempSrc: Temporal   SpO2: 100%   Weight: 127 lb (57.6 kg)   Height: '5\' 2"'$  (1.575 m)    Body mass index is 23.23 kg/m. Physical Exam Vitals and nursing note reviewed.  Constitutional:      General: She is not in acute distress.    Appearance: She is not diaphoretic.  HENT:     Head: Normocephalic and atraumatic.  Neck:     Vascular: No JVD.  Cardiovascular:     Rate and Rhythm: Normal rate. Rhythm irregular.     Heart sounds: No murmur heard. Pulmonary:     Effort: Pulmonary effort is normal. No respiratory distress.     Breath sounds: Normal breath sounds. No wheezing.  Musculoskeletal:     Right knee: No ecchymosis. Normal range of motion. ACL laxity present.     Right lower leg: No tenderness or bony tenderness.     Comments: RLE with swelling +1 no warmth not TTP  Skin:    General: Skin is warm and dry.  Neurological:     Mental Status: She is alert and oriented to person, place, and time.     Labs  reviewed: Basic Metabolic Panel: Recent Labs    12/07/21 0548 12/20/21 2238 01/06/22 0000 01/30/22 1809 02/03/22 0000 03/08/22 1105 03/09/22 0711 03/10/22 0820 03/15/22 0000  04/17/22 1128  NA 137   < >  --  132*   < > 127* 125* 131* 132*  --   K 4.2   < >  --  4.3   < > 3.9 4.2 4.2 4.7  --   CL 104   < >  --  100   < > 92* 91* 98 100  --   CO2 23   < >  --  22   < > '24 26 26 '$ 23*  --   GLUCOSE 90   < >  --  116*   < > 138* 99 97  --   --   BUN 15   < >  --  22   < > 12 24* 26* 16  --   CREATININE 0.79   < >  --  0.88   < > 0.52 0.83 0.75 0.7  --   CALCIUM 9.1   < >  --  9.4   < > 8.6* 8.5* 8.9 8.8  --   MG 1.6*  --   --  1.9  --   --   --   --   --   --   TSH  --    < > 22.70*  --   --   --   --   --  6.07* 3.519   < > = values in this interval not displayed.   Liver Function Tests: Recent Labs    10/03/21 1055 03/06/22 1306 03/07/22 0100  AST '22 18 22  '$ ALT '18 17 16  '$ ALKPHOS 75 76 66  BILITOT 0.8 0.6 0.7  PROT 7.1 6.6 5.8*  ALBUMIN 4.5 3.8 3.1*   No results for input(s): "LIPASE", "AMYLASE" in the last 8760 hours. No results for input(s): "AMMONIA" in the last 8760 hours. CBC: Recent Labs    01/30/22 1809 02/03/22 0000 03/06/22 1306 03/06/22 1930 03/09/22 0710 03/10/22 0820 03/15/22 0000 04/17/22 1128  WBC 7.6   < > 9.4   < > 11.4* 10.1 6.2 7.5  NEUTROABS 4.9  --  6.9  --   --   --   --  4.8  HGB 11.2*   < > 8.3*   < > 9.6* 10.1* 9.3* 11.3*  HCT 33.8*   < > 25.4*   < > 28.6* 30.5* 28* 34.3*  MCV 106.6*  --  104.1*   < > 100.0 102.0*  --  100.9*  PLT 194   < > 193   < > 212 222 246 186   < > = values in this interval not displayed.   Lipid Panel: No results for input(s): "CHOL", "HDL", "LDLCALC", "TRIG", "CHOLHDL", "LDLDIRECT" in the last 8760 hours. Lab Results  Component Value Date   HGBA1C 5.5 01/12/2013    Procedures since last visit: No results found.  Assessment/Plan  1. Closed bimalleolar fracture of right ankle with routine healing, subsequent encounter Healing, ambulating with walker.   2. Acute pain of right knee Tylenol 1 gram bid scheduled x 1 week Has some joint laxity, no significant injury Going to see  ortho,wearing brace  3. Overactive bladder Continue  Gemtesa  4. Iron deficiency anemia, unspecified iron deficiency anemia type Lab Results  Component Value Date   HGB 11.3 (L) 04/17/2022   Continue iron  5. Essential hypertension Increase diovan to 80 mg   6. Permanent atrial fibrillation (HCC) Rate is controlled Off DOAC due to bleeding Need digoxin level   7. Rheumatoid arthritis involving hand with negative rheumatoid factor, unspecified laterality ([place]) ON Arava Followed by rheumatology    Labs/tests ordered:  * No order type specified *CBC CMP Digoxin in 2 weeks  Next appt:  1 month f/u   Total time 93mn:  time greater than 50% of total time spent doing pt counseling and coordination of care

## 2022-05-22 DIAGNOSIS — R262 Difficulty in walking, not elsewhere classified: Secondary | ICD-10-CM | POA: Diagnosis not present

## 2022-05-22 DIAGNOSIS — R41841 Cognitive communication deficit: Secondary | ICD-10-CM | POA: Diagnosis not present

## 2022-05-22 DIAGNOSIS — S82891B Other fracture of right lower leg, initial encounter for open fracture type I or II: Secondary | ICD-10-CM | POA: Diagnosis not present

## 2022-05-22 DIAGNOSIS — F01A0 Vascular dementia, mild, without behavioral disturbance, psychotic disturbance, mood disturbance, and anxiety: Secondary | ICD-10-CM | POA: Diagnosis not present

## 2022-05-22 DIAGNOSIS — S82891D Other fracture of right lower leg, subsequent encounter for closed fracture with routine healing: Secondary | ICD-10-CM | POA: Diagnosis not present

## 2022-05-22 DIAGNOSIS — N39498 Other specified urinary incontinence: Secondary | ICD-10-CM | POA: Diagnosis not present

## 2022-05-23 DIAGNOSIS — F01A0 Vascular dementia, mild, without behavioral disturbance, psychotic disturbance, mood disturbance, and anxiety: Secondary | ICD-10-CM | POA: Diagnosis not present

## 2022-05-23 DIAGNOSIS — S82891B Other fracture of right lower leg, initial encounter for open fracture type I or II: Secondary | ICD-10-CM | POA: Diagnosis not present

## 2022-05-23 DIAGNOSIS — R41841 Cognitive communication deficit: Secondary | ICD-10-CM | POA: Diagnosis not present

## 2022-05-23 DIAGNOSIS — R262 Difficulty in walking, not elsewhere classified: Secondary | ICD-10-CM | POA: Diagnosis not present

## 2022-05-23 DIAGNOSIS — S82891D Other fracture of right lower leg, subsequent encounter for closed fracture with routine healing: Secondary | ICD-10-CM | POA: Diagnosis not present

## 2022-05-23 DIAGNOSIS — N39498 Other specified urinary incontinence: Secondary | ICD-10-CM | POA: Diagnosis not present

## 2022-05-24 DIAGNOSIS — S82891D Other fracture of right lower leg, subsequent encounter for closed fracture with routine healing: Secondary | ICD-10-CM | POA: Diagnosis not present

## 2022-05-24 DIAGNOSIS — N39498 Other specified urinary incontinence: Secondary | ICD-10-CM | POA: Diagnosis not present

## 2022-05-24 DIAGNOSIS — R262 Difficulty in walking, not elsewhere classified: Secondary | ICD-10-CM | POA: Diagnosis not present

## 2022-05-24 DIAGNOSIS — F01A0 Vascular dementia, mild, without behavioral disturbance, psychotic disturbance, mood disturbance, and anxiety: Secondary | ICD-10-CM | POA: Diagnosis not present

## 2022-05-24 DIAGNOSIS — R41841 Cognitive communication deficit: Secondary | ICD-10-CM | POA: Diagnosis not present

## 2022-05-24 DIAGNOSIS — S82891B Other fracture of right lower leg, initial encounter for open fracture type I or II: Secondary | ICD-10-CM | POA: Diagnosis not present

## 2022-05-25 ENCOUNTER — Ambulatory Visit (HOSPITAL_BASED_OUTPATIENT_CLINIC_OR_DEPARTMENT_OTHER): Payer: Medicare Other | Admitting: Orthopaedic Surgery

## 2022-05-25 DIAGNOSIS — M1711 Unilateral primary osteoarthritis, right knee: Secondary | ICD-10-CM | POA: Diagnosis not present

## 2022-05-25 NOTE — Progress Notes (Signed)
Chief Complaint: Right knee pain     History of Present Illness:    Kathy Velez is a 89 y.o. female presents today with right lateral based knee pain which has been going on for many years.  She is here with her daughter.  She lives in an independent living facility.  Her primary complaint and issue is her gait and valgus deformity about the knee.  She is in an over-the-counter brace at this point but this does not provide significant stability.    Surgical History:   None  PMH/PSH/Family History/Social History/Meds/Allergies:    Past Medical History:  Diagnosis Date   Allergy    seasonal   Anemia    Anxiety    Arthritis    Back   Asymptomatic menopausal state    Atrial fibrillation ([place])    Cancer ([place]) 2006   Colon.  Basal Cell Skin cancer- right arm   Cataract    removed bilateral   Chronic atrial fibrillation (HCC)    Colon cancer (HCC)    Constipation due to pain medication therapy    after heart surgery   Deficiency of other specified B group vitamins    Diplopia    Disorientation, unspecified    Dysrhythmia    PAF   GERD (gastroesophageal reflux disease)    Heart murmur    Hyperlipidemia    Hypertension    Hypothyroidism    Malignant neoplasm of colon, unspecified ([place])    Other specified diseases of blood and blood-forming organs    Other specified disorders of bone density and structure, unspecified forearm    Other thrombophilia ([place])    per matrix   Overactive bladder    per Matrix   Personal history of other malignant neoplasm of large intestine    Presbycusis, bilateral    per matrix   RA (rheumatoid arthritis) ([place])    Restless leg    Seizures ([place])    after Heart Surgey   Stroke ([place])    TIA- found by neurologist after    Valgus deformity, not elsewhere classified, right knee    per matrix   Vascular dementia, mild, without behavioral disturbance, psychotic disturbance, mood disturbance, and anxiety  ([place])    per matrix   Past Surgical History:  Procedure Laterality Date   ABDOMINAL HYSTERECTOMY  1970   Partial    COLON RESECTION  2006   cancer   COLON SURGERY     COLONOSCOPY     EYE SURGERY Bilateral    Cataract   MAXIMUM ACCESS (MAS)POSTERIOR LUMBAR INTERBODY FUSION (PLIF) 2 LEVEL N/A 04/12/2015   Procedure: Lumbar Three-Five Decompression, Pedicle Screw Fixation, and Posteriolateral Arthrodesis;  Surgeon: Erline Levine, MD;  Location: [facility];  Service: Neurosurgery;  Laterality: N/A;  L3-4 L4-5 Maximum access posterior lumbar fusion, possible interbodies and resection of synovial cyst at L4-5   MITRAL VALVE REPAIR  01/20/2013   Gore-tex cords to P1, P2, and P3. Magic suture to posterior medial commisure, #30 Physio 1 ring. Done in [place]   TONSILLECTOMY     about [place]  01/20/2013   #28 TriAd ring done in [place]   Social History   Socioeconomic History   Marital status: Widowed    Spouse name: Not on file   Number of children:  Not on file   Years of education: Not on file   Highest education level: Not on file  Occupational History   Occupation: elementary school teacher    Comment: retired   Tobacco Use   Smoking status: Never    Passive exposure: Past ("mother smoked")   Smokeless tobacco: Never  Vaping Use   Vaping Use: Never used  Substance and Sexual Activity   Alcohol use: Yes    Alcohol/week: 1.0 standard drink of alcohol    Types: 1 Glasses of wine per week    Comment: 1-2 per week   Drug use: No   Sexual activity: Not Currently  Other Topics Concern   Not on file  Social History Narrative   Social History      Diet? Healthy- low salt, sugar, fat      Do you drink/eat things with caffeine? On occasion      Marital status?        widow                        What year were you married? 1958      Do you live in a house, apartment, assisted living, condo, trailer, etc.? apartment      Is it one or more stories? one       How many persons live in your home? one      Do you have any pets in your home? (please list) no      Highest level of education completed? 4 year college      Current or past profession: Statistician      Do you exercise?             yes                         Type & how often? Classes- senior retirement community/ some walking      Advanced Directives      Do you have a living will?      Do you have a DNR form?                                  If not, do you want to discuss one?      Do you have signed POA/HPOA for forms?       Functional Status Completed by: Daughter, Di Kindle      Do you have difficulty bathing or dressing yourself? no      Do you have difficulty preparing food or eating? no      Do you have difficulty managing your medications? no      Do you have difficulty managing your finances? no      Do you have difficulty affording your medications? no   Right handed   [facility]   Social Determinants of Health   Financial Resource Strain: Low Risk  (01/28/2018)   Overall Financial Resource Strain (CARDIA)    Difficulty of Paying Living Expenses: Not hard at all  Food Insecurity: No Food Insecurity (03/08/2022)   Hunger Vital Sign    Worried About Running Out of Food in the Last Year: Never true    Ran Out of Food in the Last Year: Never true  Transportation Needs: No Transportation Needs (03/08/2022)   PRAPARE - Hydrologist (Medical): No  Lack of Transportation (Non-Medical): No  Physical Activity: Sufficiently Active (01/28/2018)   Exercise Vital Sign    Days of Exercise per Week: 6 days    Minutes of Exercise per Session: 30 min  Stress: No Stress Concern Present (01/28/2018)   [place]    Feeling of Stress : Only a little  Social Connections: Moderately Isolated (01/28/2018)   Social Connection and Isolation Panel [NHANES]     Frequency of Communication with Friends and Family: More than three times a week    Frequency of Social Gatherings with Friends and Family: More than three times a week    Attends Religious Services: Never    Marine scientist or Organizations: No    Attends Archivist Meetings: Never    Marital Status: Widowed   Family History  Problem Relation Age of Onset   Lung disease Mother 66   Heart disease Father 80   Colon polyps Daughter    Alcohol abuse Son    Colon cancer Neg Hx    Stomach cancer Neg Hx    Rectal cancer Neg Hx    Crohn's disease Neg Hx    Esophageal cancer Neg Hx    Allergies  Allergen Reactions   Claritin [Loratadine] Other (See Comments)    Listed on MAR Unknown reaction   Codeine Other (See Comments)    "just don't take it well"   Gabapentin Other (See Comments)    Dizziness    Molds & Smuts Other (See Comments)    Dust.  Reaction is not listed on MAR    Pollen Extract Swelling    Grass   Bee Venom Swelling and Rash   Wasp Venom Swelling and Rash   Current Outpatient Medications  Medication Sig Dispense Refill   acetaminophen (TYLENOL) 500 MG tablet Take 1,000 mg by mouth in the morning and at bedtime.     atorvastatin (LIPITOR) 20 MG tablet TAKE 1 TABLET DAILY 90 tablet 3   Cholecalciferol (VITAMIN D) 50 MCG (2000 UT) CAPS Take 2,000 Units by mouth in the morning.     cloNIDine (CATAPRES) 0.1 MG tablet Take 1 tablet (0.1 mg total) by mouth daily as needed. If SBP >180 only 30 tablet 0   digoxin (LANOXIN) 0.125 MG tablet TAKE 1 TABLET DAILY 90 tablet 3   Iron, Ferrous Sulfate, 325 (65 Fe) MG TABS Take 325 mg by mouth daily. 30 tablet 0   leflunomide (ARAVA) 20 MG tablet Take 20 mg by mouth in the morning.     levalbuterol (XOPENEX) 0.63 MG/3ML nebulizer solution Take 3 mLs (0.63 mg total) by nebulization in the morning and at bedtime. 3 mL 12   levothyroxine (SYNTHROID) 100 MCG tablet Take 1 tablet (100 mcg total) by mouth daily before  breakfast. 90 tablet 2   melatonin 3 MG TABS tablet Take 1 tablet (3 mg total) by mouth at bedtime as needed.  0   metoprolol succinate (TOPROL-XL) 50 MG 24 hr tablet TAKE 1 TABLET DAILY 90 tablet 3   mirtazapine (REMERON) 15 MG tablet TAKE 1 TABLET AT BEDTIME 90 tablet 3   Multiple Vitamin (MULTIVITAMIN) tablet Take 1 tablet by mouth in the morning.     Multiple Vitamins-Minerals (PRESERVISION AREDS 2) CAPS Take 1 capsule by mouth 2 (two) times daily.     NON FORMULARY Fluid restriction 1800cc daily  1st- 900 cc 2nd- 700 cc 3rd- 200 cc     omeprazole (PRILOSEC)  40 MG capsule TAKE 1 CAPSULE DAILY 90 capsule 3   ondansetron (ZOFRAN) 4 MG tablet Take 4 mg by mouth every 6 (six) hours as needed for nausea or vomiting.     sodium chloride 1 g tablet Take 1 g by mouth daily.     valsartan (DIOVAN) 80 MG tablet Take 80 mg by mouth at bedtime. Report if BP is not consistently <150/90 per MAR     Vibegron (GEMTESA) 75 MG TABS Take 75 mg by mouth daily.     vitamin B-12 (CYANOCOBALAMIN) 1000 MCG tablet Take 1,000 mcg by mouth in the morning.     No current facility-administered medications for this visit.   No results found.  Review of Systems:   A ROS was performed including pertinent positives and negatives as documented in the HPI.  Physical Exam :   Constitutional: NAD and appears stated age Neurological: Alert and oriented Psych: Appropriate affect and cooperative There were no vitals taken for this visit.   Comprehensive Musculoskeletal Exam:    Right knee with significant valgus deformity.  There is tenderness about the lateral aspect of the knee.  No effusion.  Range of motion is from 0 to 120 degrees with crepitus.  2+ dorsalis pedis pulse.  Imaging:   Xray (4 views right knee): Significant osteoarthritis with lateral based tibiofemoral narrowing and subsequent valgus deformity with a hypoplastic lateral femoral condyle  I personally reviewed and interpreted the  radiographs.   Assessment:   89 y.o. female with right knee pain and deformity in the setting of valgus osteoarthritis.  At this time her daughter is seeking predominantly brace modification in order to give her more stability for ambulation.  That effect we will plan to get her a well-fitting hinged knee brace.  I will plan to see her back as needed  Plan :    -Return to clinic as needed     I personally saw and evaluated the patient, and participated in the management and treatment plan.  Vanetta Mulders, MD Attending Physician, Orthopedic Surgery  This document was dictated using Dragon voice recognition software. A reasonable attempt at proof reading has been made to minimize errors.

## 2022-05-28 DIAGNOSIS — R41841 Cognitive communication deficit: Secondary | ICD-10-CM | POA: Diagnosis not present

## 2022-05-28 DIAGNOSIS — R4189 Other symptoms and signs involving cognitive functions and awareness: Secondary | ICD-10-CM | POA: Diagnosis not present

## 2022-05-28 DIAGNOSIS — R296 Repeated falls: Secondary | ICD-10-CM | POA: Diagnosis not present

## 2022-05-28 DIAGNOSIS — R262 Difficulty in walking, not elsewhere classified: Secondary | ICD-10-CM | POA: Diagnosis not present

## 2022-05-28 DIAGNOSIS — F01A0 Vascular dementia, mild, without behavioral disturbance, psychotic disturbance, mood disturbance, and anxiety: Secondary | ICD-10-CM | POA: Diagnosis not present

## 2022-05-28 DIAGNOSIS — Z9181 History of falling: Secondary | ICD-10-CM | POA: Diagnosis not present

## 2022-05-28 DIAGNOSIS — N39498 Other specified urinary incontinence: Secondary | ICD-10-CM | POA: Diagnosis not present

## 2022-05-28 DIAGNOSIS — R278 Other lack of coordination: Secondary | ICD-10-CM | POA: Diagnosis not present

## 2022-05-28 DIAGNOSIS — S82841D Displaced bimalleolar fracture of right lower leg, subsequent encounter for closed fracture with routine healing: Secondary | ICD-10-CM | POA: Diagnosis not present

## 2022-05-28 DIAGNOSIS — S82891D Other fracture of right lower leg, subsequent encounter for closed fracture with routine healing: Secondary | ICD-10-CM | POA: Diagnosis not present

## 2022-05-28 DIAGNOSIS — R2681 Unsteadiness on feet: Secondary | ICD-10-CM | POA: Diagnosis not present

## 2022-05-28 DIAGNOSIS — M6389 Disorders of muscle in diseases classified elsewhere, multiple sites: Secondary | ICD-10-CM | POA: Diagnosis not present

## 2022-05-28 DIAGNOSIS — S82891B Other fracture of right lower leg, initial encounter for open fracture type I or II: Secondary | ICD-10-CM | POA: Diagnosis not present

## 2022-05-28 DIAGNOSIS — I4821 Permanent atrial fibrillation: Secondary | ICD-10-CM | POA: Diagnosis not present

## 2022-05-29 DIAGNOSIS — S82891B Other fracture of right lower leg, initial encounter for open fracture type I or II: Secondary | ICD-10-CM | POA: Diagnosis not present

## 2022-05-29 DIAGNOSIS — S82891D Other fracture of right lower leg, subsequent encounter for closed fracture with routine healing: Secondary | ICD-10-CM | POA: Diagnosis not present

## 2022-05-29 DIAGNOSIS — F01A0 Vascular dementia, mild, without behavioral disturbance, psychotic disturbance, mood disturbance, and anxiety: Secondary | ICD-10-CM | POA: Diagnosis not present

## 2022-05-29 DIAGNOSIS — R41841 Cognitive communication deficit: Secondary | ICD-10-CM | POA: Diagnosis not present

## 2022-05-29 DIAGNOSIS — R262 Difficulty in walking, not elsewhere classified: Secondary | ICD-10-CM | POA: Diagnosis not present

## 2022-05-29 DIAGNOSIS — N39498 Other specified urinary incontinence: Secondary | ICD-10-CM | POA: Diagnosis not present

## 2022-05-30 DIAGNOSIS — R41841 Cognitive communication deficit: Secondary | ICD-10-CM | POA: Diagnosis not present

## 2022-05-30 DIAGNOSIS — S82891D Other fracture of right lower leg, subsequent encounter for closed fracture with routine healing: Secondary | ICD-10-CM | POA: Diagnosis not present

## 2022-05-30 DIAGNOSIS — N39498 Other specified urinary incontinence: Secondary | ICD-10-CM | POA: Diagnosis not present

## 2022-05-30 DIAGNOSIS — F01A0 Vascular dementia, mild, without behavioral disturbance, psychotic disturbance, mood disturbance, and anxiety: Secondary | ICD-10-CM | POA: Diagnosis not present

## 2022-05-30 DIAGNOSIS — S82891B Other fracture of right lower leg, initial encounter for open fracture type I or II: Secondary | ICD-10-CM | POA: Diagnosis not present

## 2022-05-30 DIAGNOSIS — R262 Difficulty in walking, not elsewhere classified: Secondary | ICD-10-CM | POA: Diagnosis not present

## 2022-05-31 DIAGNOSIS — S82891B Other fracture of right lower leg, initial encounter for open fracture type I or II: Secondary | ICD-10-CM | POA: Diagnosis not present

## 2022-05-31 DIAGNOSIS — N39498 Other specified urinary incontinence: Secondary | ICD-10-CM | POA: Diagnosis not present

## 2022-05-31 DIAGNOSIS — S82891D Other fracture of right lower leg, subsequent encounter for closed fracture with routine healing: Secondary | ICD-10-CM | POA: Diagnosis not present

## 2022-05-31 DIAGNOSIS — F01A0 Vascular dementia, mild, without behavioral disturbance, psychotic disturbance, mood disturbance, and anxiety: Secondary | ICD-10-CM | POA: Diagnosis not present

## 2022-05-31 DIAGNOSIS — R262 Difficulty in walking, not elsewhere classified: Secondary | ICD-10-CM | POA: Diagnosis not present

## 2022-05-31 DIAGNOSIS — R41841 Cognitive communication deficit: Secondary | ICD-10-CM | POA: Diagnosis not present

## 2022-06-01 DIAGNOSIS — N39498 Other specified urinary incontinence: Secondary | ICD-10-CM | POA: Diagnosis not present

## 2022-06-01 DIAGNOSIS — R262 Difficulty in walking, not elsewhere classified: Secondary | ICD-10-CM | POA: Diagnosis not present

## 2022-06-01 DIAGNOSIS — S82891B Other fracture of right lower leg, initial encounter for open fracture type I or II: Secondary | ICD-10-CM | POA: Diagnosis not present

## 2022-06-01 DIAGNOSIS — F01A0 Vascular dementia, mild, without behavioral disturbance, psychotic disturbance, mood disturbance, and anxiety: Secondary | ICD-10-CM | POA: Diagnosis not present

## 2022-06-01 DIAGNOSIS — S82891D Other fracture of right lower leg, subsequent encounter for closed fracture with routine healing: Secondary | ICD-10-CM | POA: Diagnosis not present

## 2022-06-01 DIAGNOSIS — R41841 Cognitive communication deficit: Secondary | ICD-10-CM | POA: Diagnosis not present

## 2022-06-04 DIAGNOSIS — S82891B Other fracture of right lower leg, initial encounter for open fracture type I or II: Secondary | ICD-10-CM | POA: Diagnosis not present

## 2022-06-04 DIAGNOSIS — S82891D Other fracture of right lower leg, subsequent encounter for closed fracture with routine healing: Secondary | ICD-10-CM | POA: Diagnosis not present

## 2022-06-04 DIAGNOSIS — R262 Difficulty in walking, not elsewhere classified: Secondary | ICD-10-CM | POA: Diagnosis not present

## 2022-06-04 DIAGNOSIS — R41841 Cognitive communication deficit: Secondary | ICD-10-CM | POA: Diagnosis not present

## 2022-06-04 DIAGNOSIS — N39498 Other specified urinary incontinence: Secondary | ICD-10-CM | POA: Diagnosis not present

## 2022-06-04 DIAGNOSIS — F01A0 Vascular dementia, mild, without behavioral disturbance, psychotic disturbance, mood disturbance, and anxiety: Secondary | ICD-10-CM | POA: Diagnosis not present

## 2022-06-05 ENCOUNTER — Inpatient Hospital Stay (HOSPITAL_BASED_OUTPATIENT_CLINIC_OR_DEPARTMENT_OTHER): Payer: Medicare Other | Admitting: Oncology

## 2022-06-05 ENCOUNTER — Inpatient Hospital Stay: Payer: Medicare Other | Attending: Oncology

## 2022-06-05 VITALS — BP 137/75 | HR 82 | Temp 98.2°F | Resp 18 | Ht 62.0 in | Wt 128.0 lb

## 2022-06-05 DIAGNOSIS — S82891D Other fracture of right lower leg, subsequent encounter for closed fracture with routine healing: Secondary | ICD-10-CM | POA: Diagnosis not present

## 2022-06-05 DIAGNOSIS — Z79899 Other long term (current) drug therapy: Secondary | ICD-10-CM | POA: Diagnosis not present

## 2022-06-05 DIAGNOSIS — C185 Malignant neoplasm of splenic flexure: Secondary | ICD-10-CM

## 2022-06-05 DIAGNOSIS — I4891 Unspecified atrial fibrillation: Secondary | ICD-10-CM | POA: Insufficient documentation

## 2022-06-05 DIAGNOSIS — R41841 Cognitive communication deficit: Secondary | ICD-10-CM | POA: Diagnosis not present

## 2022-06-05 DIAGNOSIS — Z7901 Long term (current) use of anticoagulants: Secondary | ICD-10-CM | POA: Insufficient documentation

## 2022-06-05 DIAGNOSIS — Z85038 Personal history of other malignant neoplasm of large intestine: Secondary | ICD-10-CM | POA: Diagnosis not present

## 2022-06-05 DIAGNOSIS — I482 Chronic atrial fibrillation, unspecified: Secondary | ICD-10-CM | POA: Diagnosis not present

## 2022-06-05 DIAGNOSIS — E039 Hypothyroidism, unspecified: Secondary | ICD-10-CM | POA: Insufficient documentation

## 2022-06-05 DIAGNOSIS — M069 Rheumatoid arthritis, unspecified: Secondary | ICD-10-CM | POA: Diagnosis not present

## 2022-06-05 DIAGNOSIS — S82891B Other fracture of right lower leg, initial encounter for open fracture type I or II: Secondary | ICD-10-CM | POA: Diagnosis not present

## 2022-06-05 DIAGNOSIS — N39498 Other specified urinary incontinence: Secondary | ICD-10-CM | POA: Diagnosis not present

## 2022-06-05 DIAGNOSIS — Z08 Encounter for follow-up examination after completed treatment for malignant neoplasm: Secondary | ICD-10-CM | POA: Insufficient documentation

## 2022-06-05 DIAGNOSIS — D539 Nutritional anemia, unspecified: Secondary | ICD-10-CM | POA: Insufficient documentation

## 2022-06-05 DIAGNOSIS — R262 Difficulty in walking, not elsewhere classified: Secondary | ICD-10-CM | POA: Diagnosis not present

## 2022-06-05 DIAGNOSIS — Z8673 Personal history of transient ischemic attack (TIA), and cerebral infarction without residual deficits: Secondary | ICD-10-CM | POA: Insufficient documentation

## 2022-06-05 DIAGNOSIS — F01A0 Vascular dementia, mild, without behavioral disturbance, psychotic disturbance, mood disturbance, and anxiety: Secondary | ICD-10-CM | POA: Diagnosis not present

## 2022-06-05 DIAGNOSIS — D509 Iron deficiency anemia, unspecified: Secondary | ICD-10-CM | POA: Diagnosis not present

## 2022-06-05 LAB — CEA (ACCESS): CEA (CHCC): 4.39 ng/mL (ref 0.00–5.00)

## 2022-06-05 LAB — CBC WITH DIFFERENTIAL (CANCER CENTER ONLY)
Abs Immature Granulocytes: 0.02 10*3/uL (ref 0.00–0.07)
Basophils Absolute: 0.1 10*3/uL (ref 0.0–0.1)
Basophils Relative: 1 %
Eosinophils Absolute: 0.1 10*3/uL (ref 0.0–0.5)
Eosinophils Relative: 1 %
HCT: 33.9 % — ABNORMAL LOW (ref 36.0–46.0)
Hemoglobin: 11.4 g/dL — ABNORMAL LOW (ref 12.0–15.0)
Immature Granulocytes: 0 %
Lymphocytes Relative: 21 %
Lymphs Abs: 1.8 10*3/uL (ref 0.7–4.0)
MCH: 33.8 pg (ref 26.0–34.0)
MCHC: 33.6 g/dL (ref 30.0–36.0)
MCV: 100.6 fL — ABNORMAL HIGH (ref 80.0–100.0)
Monocytes Absolute: 1 10*3/uL (ref 0.1–1.0)
Monocytes Relative: 12 %
Neutro Abs: 5.8 10*3/uL (ref 1.7–7.7)
Neutrophils Relative %: 65 %
Platelet Count: 187 10*3/uL (ref 150–400)
RBC: 3.37 MIL/uL — ABNORMAL LOW (ref 3.87–5.11)
RDW: 14.7 % (ref 11.5–15.5)
WBC Count: 8.9 10*3/uL (ref 4.0–10.5)
nRBC: 0 % (ref 0.0–0.2)

## 2022-06-05 NOTE — Progress Notes (Signed)
[facility] OFFICE PROGRESS NOTE   Diagnosis: Colon cancer, anemia  INTERVAL HISTORY:   Kathy Velez returns as scheduled.  She is here with her daughter.  She continues to have a dry mouth.  No hand erythema or skin thickening.  She does not have significant hand arthritis pain.  She is wearing a brace on the right knee.  She has a small amount of blood when wiping after a bowel movement.  Objective:  Vital signs in last 24 hours:  Blood pressure 137/75, pulse 82, temperature 98.2 F (36.8 C), resp. rate 18, height '5\' 2"'$  (1.575 m), weight 128 lb (58.1 kg), SpO2 95 %.    HEENT: No thrush Lymphatics: No cervical or supraclavicular nodes Resp: Coarse end inspiratory rhonchi at the posterior base bilaterally, no respiratory distress Cardio: Irregular GI: No mass, nontender, no hepatosplenomegaly Vascular: Trace edema at the right lower leg  Skin: Palms without erythema  Portacath/PICC-without erythema  Lab Results:  Lab Results  Component Value Date   WBC 8.9 06/05/2022   HGB 11.4 (L) 06/05/2022   HCT 33.9 (L) 06/05/2022   MCV 100.6 (H) 06/05/2022   PLT 187 06/05/2022   NEUTROABS 5.8 06/05/2022    CMP  Lab Results  Component Value Date   NA 132 (A) 03/15/2022   K 4.7 03/15/2022   CL 100 03/15/2022   CO2 23 (A) 03/15/2022   GLUCOSE 97 03/10/2022   BUN 16 03/15/2022   CREATININE 0.7 03/15/2022   CALCIUM 8.8 03/15/2022   PROT 5.8 (L) 03/07/2022   ALBUMIN 3.1 (L) 03/07/2022   AST 22 03/07/2022   ALT 16 03/07/2022   ALKPHOS 66 03/07/2022   BILITOT 0.7 03/07/2022   GFRNONAA >60 03/10/2022   GFRAA 79.35 02/02/2021    Lab Results  Component Value Date   CEA1 8.2 (H) 03/07/2022   CEA 6.89 (H) 04/17/2022     Medications: I have reviewed the patient's current medications.   Assessment/Plan: Colon cancer-splenic flexure mass on colonoscopy 02/22/2021 Biopsy 02/22/2021-invasive well differentiated adenocarcinoma, loss of MLH1 and PMS2 expression,  MSI high, MLH1 hyper methylation present CTs 03/31/2021-no evidence of metastatic disease, possible mass at the splenic flexure, 4 mm left upper lung nodule, bilateral lung scarring Cycle 1 pembrolizumab 06/13/2021 Cycle 2 pembrolizumab 07/04/2021 Cycle 3 pembrolizumab 07/25/2021 Cycle 4 pembrolizumab 08/22/2021 Colonoscopy 09/20/2021-no residual mass at the splenic flexure/descending colon CT abdomen/pelvis 03/06/2022-diverticulosis without evidence of acute diverticulitis, partial colectomy Macrocytic anemia Sessile serrated polyp with focus of high-grade dysplasia on colonoscopy 02/22/2021 Colon cancer 2006, status post a right colectomy-"node positive "cancer per patient, received adjuvant chemotherapy Atrial fibrillation, maintained on anticoagulation History of a CVA and TIA Mild cognitive impairment Rheumatoid arthritis Hypothyroidism 10.  Chronic lung disease? 43.  Fall with head injury 10/22/2021 Scalp hematoma with bleeding 12/06/2021 12.  01/30/2022-displaced distal right fibular fracture, small displaced posterior malleolar fracture 13.  Admission 03/07/2022 with acute GI bleeding 14.  Fall with right distal fibular/malleolar fracture [place] 2023      Disposition: Kathy Velez has a history of colon cancer.  She is in clinical remission after completing treatment of pembrolizumab in May 2023.  The pembrolizumab treatment was complicated by the development of erythema and skin thickening of the hands, and by mouth.  The symptoms have improved.  There is no clinical evidence for progression of the colon cancer.  The CEA is normal today.   She has a history of anemia secondary to bleeding from colon cancer and she may have underlying  myelodysplasia.  She is stable from a hematologic standpoint.  The plan is to continue following her with observation.  She will return for an office visit in 4 months.  We will see her in the interim if she develops new symptoms.  Kathy Coder,  MD  06/05/2022  3:35 PM

## 2022-06-06 ENCOUNTER — Encounter: Payer: Self-pay | Admitting: Orthopedic Surgery

## 2022-06-06 ENCOUNTER — Non-Acute Institutional Stay: Payer: Medicare Other | Admitting: Orthopedic Surgery

## 2022-06-06 DIAGNOSIS — R262 Difficulty in walking, not elsewhere classified: Secondary | ICD-10-CM | POA: Diagnosis not present

## 2022-06-06 DIAGNOSIS — R531 Weakness: Secondary | ICD-10-CM | POA: Diagnosis not present

## 2022-06-06 DIAGNOSIS — S82891B Other fracture of right lower leg, initial encounter for open fracture type I or II: Secondary | ICD-10-CM | POA: Diagnosis not present

## 2022-06-06 DIAGNOSIS — N39498 Other specified urinary incontinence: Secondary | ICD-10-CM | POA: Diagnosis not present

## 2022-06-06 DIAGNOSIS — E871 Hypo-osmolality and hyponatremia: Secondary | ICD-10-CM

## 2022-06-06 DIAGNOSIS — F01A0 Vascular dementia, mild, without behavioral disturbance, psychotic disturbance, mood disturbance, and anxiety: Secondary | ICD-10-CM | POA: Diagnosis not present

## 2022-06-06 DIAGNOSIS — I4821 Permanent atrial fibrillation: Secondary | ICD-10-CM | POA: Diagnosis not present

## 2022-06-06 DIAGNOSIS — W19XXXA Unspecified fall, initial encounter: Secondary | ICD-10-CM | POA: Diagnosis not present

## 2022-06-06 DIAGNOSIS — S82891D Other fracture of right lower leg, subsequent encounter for closed fracture with routine healing: Secondary | ICD-10-CM | POA: Diagnosis not present

## 2022-06-06 DIAGNOSIS — M25561 Pain in right knee: Secondary | ICD-10-CM | POA: Diagnosis not present

## 2022-06-06 DIAGNOSIS — D649 Anemia, unspecified: Secondary | ICD-10-CM | POA: Diagnosis not present

## 2022-06-06 DIAGNOSIS — N390 Urinary tract infection, site not specified: Secondary | ICD-10-CM | POA: Diagnosis not present

## 2022-06-06 DIAGNOSIS — R41841 Cognitive communication deficit: Secondary | ICD-10-CM | POA: Diagnosis not present

## 2022-06-06 NOTE — Progress Notes (Addendum)
Location:  [facility] Room Number: 152/A Place of Service:  ALF [phone]) Provider:  Yvonna Alanis, NP   Virgie Dad, MD  Patient Care Team: Virgie Dad, MD as PCP - General (Internal Medicine) Pixie Casino, MD as PCP - Cardiology (Cardiology) Pieter Partridge, DO as Consulting Physician (Neurology)  Extended Emergency Contact Information Primary Emergency Contact: [name],[name] Address: [address]          [place], [place] [address] Johnnette Litter of [place] Phone: [phone] Mobile Phone: [phone] Relation: Daughter Secondary Emergency Contact: Eula Listen Address: [address]          [place], [place] [address] Johnnette Litter of [place] Mobile Phone: [phone] Relation: Son  Code Status:  DNR Goals of care: Advanced Directive information    06/05/2022    3:22 PM  Advanced Directives  Does Patient Have a Medical Advance Directive? Yes  Type of Advance Directive Out of facility DNR (pink MOST or yellow form)  Does patient want to make changes to medical advance directive? No - Patient declined     Chief Complaint  Patient presents with   Acute Visit    HPI:  Pt is a 89 y.o. female seen today for acute visit due to weakness.   She currently resides on the assisted living unit at [facility] PMH: afib, aortic atherosclerosis, HTN, carotid stenosis, tricuspid valve surgery 2014, TIA, neurocognitive disorder due to vascular disease, interstitial lung disease, colon cancer, diverticulosis, GERD, GI Bleed, hypothyroidism, RA, osteopenia, OAB, anemia and depression.   This morning she was ambulating to bathroom, when her knee buckled and she fell to floor. Nursing noted increased confusion after incident. Stat labs: WBC 7.4, Hgb 12.1, Hct 36.4, platelets 184, glucose 104, calcium 9.7, BUN/creat 18/0.7, Na+ 137, K+ 4.1. Digoxin level 0.58. She is able to ambulate without pain after incident. WBAT.  Daughter reports confusion in  AM is baseline for her. She also completed PT recently.      Past Medical History:  Diagnosis Date   Allergy    seasonal   Anemia    Anxiety    Arthritis    Back   Asymptomatic menopausal state    Atrial fibrillation ([facility])    Cancer ([facility]) 2006   Colon.  Basal Cell Skin cancer- right arm   Cataract    removed bilateral   Chronic atrial fibrillation (HCC)    Colon cancer (HCC)    Constipation due to pain medication therapy    after heart surgery   Deficiency of other specified B group vitamins    Diplopia    Disorientation, unspecified    Dysrhythmia    PAF   GERD (gastroesophageal reflux disease)    Heart murmur    Hyperlipidemia    Hypertension    Hypothyroidism    Malignant neoplasm of colon, unspecified ([place])    Other specified diseases of blood and blood-forming organs    Other specified disorders of bone density and structure, unspecified forearm    Other thrombophilia ([place])    per matrix   Overactive bladder    per Matrix   Personal history of other malignant neoplasm of large intestine    Presbycusis, bilateral    per matrix   RA (rheumatoid arthritis) ([facility])    Restless leg    Seizures ([facility])    after Heart Surgey   Stroke ([facility])    TIA- found by neurologist after    Valgus deformity, not elsewhere classified, right knee  per matrix   Vascular dementia, mild, without behavioral disturbance, psychotic disturbance, mood disturbance, and anxiety ([place] [facility])    per matrix   Past Surgical History:  Procedure Laterality Date   ABDOMINAL HYSTERECTOMY  1970   Partial    COLON RESECTION  2006   cancer   COLON SURGERY     COLONOSCOPY     EYE SURGERY Bilateral    Cataract   MAXIMUM ACCESS (MAS)POSTERIOR LUMBAR INTERBODY FUSION (PLIF) 2 LEVEL N/A 04/12/2015   Procedure: Lumbar Three-Five Decompression, Pedicle Screw Fixation, and Posteriolateral Arthrodesis;  Surgeon: Erline Levine, MD;  Location: [facility];  Service: Neurosurgery;  Laterality: N/A;  L3-4 L4-5  Maximum access posterior lumbar fusion, possible interbodies and resection of synovial cyst at L4-5   MITRAL VALVE REPAIR  01/20/2013   Gore-tex cords to P1, P2, and P3. Magic suture to posterior medial commisure, #30 Physio 1 ring. Done in [place]   TONSILLECTOMY     about [place]  01/20/2013   #28 TriAd ring done in [place]    Allergies  Allergen Reactions   Claritin [Loratadine] Other (See Comments)    Listed on MAR Unknown reaction   Codeine Other (See Comments)    "just don't take it well"   Gabapentin Other (See Comments)    Dizziness    Molds & Smuts Other (See Comments)    Dust.  Reaction is not listed on MAR    Pollen Extract Swelling    Grass   Bee Venom Swelling and Rash   Wasp Venom Swelling and Rash    Outpatient Encounter Medications as of 06/06/2022  Medication Sig   acetaminophen (TYLENOL) 500 MG tablet Take 500 mg by mouth every 6 (six) hours as needed for mild pain.   atorvastatin (LIPITOR) 20 MG tablet TAKE 1 TABLET DAILY   Cholecalciferol (VITAMIN D) 50 MCG (2000 UT) CAPS Take 2,000 Units by mouth in the morning.   cloNIDine (CATAPRES) 0.1 MG tablet Take 1 tablet (0.1 mg total) by mouth daily as needed. If SBP >180 only   digoxin (LANOXIN) 0.125 MG tablet TAKE 1 TABLET DAILY   Iron, Ferrous Sulfate, 325 (65 Fe) MG TABS Take 325 mg by mouth daily.   leflunomide (ARAVA) 20 MG tablet Take 20 mg by mouth in the morning.   levalbuterol (XOPENEX) 0.63 MG/3ML nebulizer solution Take 3 mLs (0.63 mg total) by nebulization in the morning and at bedtime.   levothyroxine (SYNTHROID) 100 MCG tablet Take 1 tablet (100 mcg total) by mouth daily before breakfast.   melatonin 3 MG TABS tablet Take 1 tablet (3 mg total) by mouth at bedtime as needed.   metoprolol succinate (TOPROL-XL) 50 MG 24 hr tablet TAKE 1 TABLET DAILY   mirtazapine (REMERON) 15 MG tablet TAKE 1 TABLET AT BEDTIME   Multiple Vitamin (MULTIVITAMIN) tablet Take 1 tablet by mouth in  the morning.   Multiple Vitamins-Minerals (PRESERVISION AREDS 2) CAPS Take 1 capsule by mouth 2 (two) times daily.   NON FORMULARY Fluid restriction 1800cc daily  1st- 900 cc 2nd- 700 cc 3rd- 200 cc   omeprazole (PRILOSEC) 40 MG capsule TAKE 1 CAPSULE DAILY   ondansetron (ZOFRAN) 4 MG tablet Take 4 mg by mouth every 6 (six) hours as needed for nausea or vomiting. (Patient not taking: Reported on 06/05/2022)   sodium chloride 1 g tablet Take 1 g by mouth daily.   valsartan (DIOVAN) 80 MG tablet Take 80 mg by mouth at bedtime. Report  if BP is not consistently <150/90 per MAR   Vibegron (GEMTESA) 75 MG TABS Take 75 mg by mouth daily.   vitamin B-12 (CYANOCOBALAMIN) 1000 MCG tablet Take 1,000 mcg by mouth in the morning.   No facility-administered encounter medications on file as of 06/06/2022.    Review of Systems  Constitutional:  Negative for activity change and appetite change.  Eyes:  Negative for visual disturbance.  Respiratory:  Negative for cough, shortness of breath and wheezing.   Cardiovascular:  Negative for chest pain and leg swelling.  Gastrointestinal:  Negative for abdominal distention and abdominal pain.  Genitourinary:  Negative for dysuria and frequency.  Musculoskeletal:  Positive for arthralgias and gait problem.  Skin:  Negative for wound.  Neurological:  Positive for weakness. Negative for dizziness and headaches.  Psychiatric/Behavioral:  Positive for confusion. Negative for dysphoric mood. The patient is not nervous/anxious.     Immunization History  Administered Date(s) Administered   Fluad Quad(high Dose 65+) 01/05/2019, 12/07/2021   H1N1 04/07/2008   Influenza Whole 12/29/2008, 12/22/2009   Influenza, High Dose Seasonal PF 01/15/2012, 12/23/2014, 01/22/2020   Influenza,inj,Quad PF,6+ Mos 01/22/2014, 12/10/2016, 01/17/2018   Influenza,trivalent, recombinat, inj, PF 01/05/2016   Influenza-Unspecified 04/07/2008, 12/15/2014, 01/13/2021   Moderna Covid-19  Vaccine Bivalent Booster 66yr & up 01/29/2022   Moderna Sars-Covid-2 Vaccination 04/07/2019, 05/05/2019, 02/09/2020   PFIZER(Purple Top)SARS-COV-2 Vaccination 01/06/2021   PPD Test 07/17/2010, 04/19/2015, 04/25/2015   Pneumococcal Conjugate-13 11/10/2013   Pneumococcal Polysaccharide-23 12/27/2010, 12/05/2016   RSV,unspecified 03/28/2022   Tdap 04/15/2017   Zoster Recombinat (Shingrix) 04/09/2017, 06/18/2017   Pertinent  Health Maintenance Due  Topic Date Due   INFLUENZA VACCINE  Completed   DEXA SCAN  Completed      03/09/2022    2:00 PM 03/09/2022    9:32 PM 03/10/2022    7:14 AM 03/22/2022   11:45 AM 05/21/2022    1:06 PM  Fall Risk  Falls in the past year?    1 1  Was there an injury with Fall?    1 1  Fall Risk Category Calculator    3 3  Fall Risk Category (Retired)    High   (RETIRED) Patient Fall Risk Level High fall risk High fall risk High fall risk    Patient at Risk for Falls Due to    History of fall(s) History of fall(s);Impaired balance/gait;Impaired mobility  Fall risk Follow up    Falls evaluation completed Falls evaluation completed   Functional Status Survey:    Vitals:   06/06/22 1608  BP: (!) 165/96  Pulse: 82  Resp: (!) 96  Temp: (!) 97.3 F (36.3 C)  Weight: 121 lb 11.2 oz (55.2 kg)   Body mass index is 22.26 kg/m. Physical Exam Vitals reviewed.  Constitutional:      General: She is not in acute distress. HENT:     Head: Normocephalic and atraumatic.  Eyes:     General:        Right eye: No discharge.        Left eye: No discharge.  Cardiovascular:     Rate and Rhythm: Normal rate. Rhythm irregular.     Pulses: Normal pulses.     Heart sounds: Normal heart sounds.  Pulmonary:     Effort: Pulmonary effort is normal. No respiratory distress.     Breath sounds: Normal breath sounds. No wheezing.  Abdominal:     General: Bowel sounds are normal.     Palpations: Abdomen is soft.  Musculoskeletal:     Cervical back: Neck supple.      Right lower leg: No deformity, tenderness or bony tenderness. Edema present.     Left lower leg: No edema.     Comments: Non pitting, BLE WBAT  Skin:    General: Skin is warm and dry.     Capillary Refill: Capillary refill takes less than 2 seconds.  Neurological:     General: No focal deficit present.     Mental Status: She is alert. Mental status is at baseline.     Motor: Weakness present.     Gait: Gait abnormal.     Comments: walker  Psychiatric:        Mood and Affect: Mood normal.     Labs reviewed: Recent Labs    12/07/21 0548 12/20/21 2238 01/30/22 1809 02/03/22 0000 03/08/22 1105 03/09/22 0711 03/10/22 0820 03/15/22 0000  NA 137   < > 132*   < > 127* 125* 131* 132*  K 4.2   < > 4.3   < > 3.9 4.2 4.2 4.7  CL 104   < > 100   < > 92* 91* 98 100  CO2 23   < > 22   < > '24 26 26 '$ 23*  GLUCOSE 90   < > 116*   < > 138* 99 97  --   BUN 15   < > 22   < > 12 24* 26* 16  CREATININE 0.79   < > 0.88   < > 0.52 0.83 0.75 0.7  CALCIUM 9.1   < > 9.4   < > 8.6* 8.5* 8.9 8.8  MG 1.6*  --  1.9  --   --   --   --   --    < > = values in this interval not displayed.   Recent Labs    10/03/21 1055 03/06/22 1306 03/07/22 0100  AST '22 18 22  '$ ALT '18 17 16  '$ ALKPHOS 75 76 66  BILITOT 0.8 0.6 0.7  PROT 7.1 6.6 5.8*  ALBUMIN 4.5 3.8 3.1*   Recent Labs    03/06/22 1306 03/06/22 1930 03/10/22 0820 03/15/22 0000 04/17/22 1128 06/05/22 1432  WBC 9.4   < > 10.1 6.2 7.5 8.9  NEUTROABS 6.9  --   --   --  4.8 5.8  HGB 8.3*   < > 10.1* 9.3* 11.3* 11.4*  HCT 25.4*   < > 30.5* 28* 34.3* 33.9*  MCV 104.1*   < > 102.0*  --  100.9* 100.6*  PLT 193   < > 222 246 186 187   < > = values in this interval not displayed.   Lab Results  Component Value Date   TSH 3.519 04/17/2022   Lab Results  Component Value Date   HGBA1C 5.5 01/12/2013   Lab Results  Component Value Date   CHOL 115 02/02/2021   HDL 48 02/02/2021   LDLCALC 49 02/02/2021   TRIG 91 02/02/2021    Significant  Diagnostic Results in last 30 days:  No results found.  Assessment/Plan 1. Weakness - fall this morning using bathroom> reports right knee buckle - exam unremarkable  - cont falls safety precautions - recommend using call bell at night for bathroom  2. Fall, initial encounter - see above - consider another PT evaluation if falls continue  3. Hyponatremia - Na+ 137 - ? Confusion in AM - on sodium tablets  4. Acute pain of right knee -  ongoing  - daughter has provided knee brace - recently completed PT - cont tylenol prn for pain  5. Permanent atrial fibrillation - digoxin level 0.58 - results faxed to cardiology   Family/ staff Communication: plan discussed with patient, daughter and nurse  Labs/tests ordered:  none

## 2022-06-07 DIAGNOSIS — N39498 Other specified urinary incontinence: Secondary | ICD-10-CM | POA: Diagnosis not present

## 2022-06-07 DIAGNOSIS — S82891B Other fracture of right lower leg, initial encounter for open fracture type I or II: Secondary | ICD-10-CM | POA: Diagnosis not present

## 2022-06-07 DIAGNOSIS — R41841 Cognitive communication deficit: Secondary | ICD-10-CM | POA: Diagnosis not present

## 2022-06-07 DIAGNOSIS — R262 Difficulty in walking, not elsewhere classified: Secondary | ICD-10-CM | POA: Diagnosis not present

## 2022-06-07 DIAGNOSIS — F01A0 Vascular dementia, mild, without behavioral disturbance, psychotic disturbance, mood disturbance, and anxiety: Secondary | ICD-10-CM | POA: Diagnosis not present

## 2022-06-07 DIAGNOSIS — S82891D Other fracture of right lower leg, subsequent encounter for closed fracture with routine healing: Secondary | ICD-10-CM | POA: Diagnosis not present

## 2022-06-08 ENCOUNTER — Telehealth: Payer: Self-pay | Admitting: Adult Health

## 2022-06-08 DIAGNOSIS — N3000 Acute cystitis without hematuria: Secondary | ICD-10-CM

## 2022-06-08 DIAGNOSIS — R262 Difficulty in walking, not elsewhere classified: Secondary | ICD-10-CM | POA: Diagnosis not present

## 2022-06-08 DIAGNOSIS — F01A0 Vascular dementia, mild, without behavioral disturbance, psychotic disturbance, mood disturbance, and anxiety: Secondary | ICD-10-CM | POA: Diagnosis not present

## 2022-06-08 DIAGNOSIS — S82891B Other fracture of right lower leg, initial encounter for open fracture type I or II: Secondary | ICD-10-CM | POA: Diagnosis not present

## 2022-06-08 DIAGNOSIS — S82891D Other fracture of right lower leg, subsequent encounter for closed fracture with routine healing: Secondary | ICD-10-CM | POA: Diagnosis not present

## 2022-06-08 DIAGNOSIS — R41841 Cognitive communication deficit: Secondary | ICD-10-CM | POA: Diagnosis not present

## 2022-06-08 DIAGNOSIS — N39498 Other specified urinary incontinence: Secondary | ICD-10-CM | POA: Diagnosis not present

## 2022-06-08 MED ORDER — CEPHALEXIN 500 MG PO CAPS: 500.0000 mg | ORAL_CAPSULE | Freq: Three times a day (TID) | ORAL | 0 refills | Status: AC

## 2022-06-08 NOTE — Telephone Encounter (Signed)
Urine culture grew >100,000 colonies of Klebsiella. Sensitive to cephalosporins. Keflex started.

## 2022-06-11 DIAGNOSIS — R41841 Cognitive communication deficit: Secondary | ICD-10-CM | POA: Diagnosis not present

## 2022-06-11 DIAGNOSIS — R262 Difficulty in walking, not elsewhere classified: Secondary | ICD-10-CM | POA: Diagnosis not present

## 2022-06-11 DIAGNOSIS — S82891D Other fracture of right lower leg, subsequent encounter for closed fracture with routine healing: Secondary | ICD-10-CM | POA: Diagnosis not present

## 2022-06-11 DIAGNOSIS — F01A0 Vascular dementia, mild, without behavioral disturbance, psychotic disturbance, mood disturbance, and anxiety: Secondary | ICD-10-CM | POA: Diagnosis not present

## 2022-06-11 DIAGNOSIS — S82891B Other fracture of right lower leg, initial encounter for open fracture type I or II: Secondary | ICD-10-CM | POA: Diagnosis not present

## 2022-06-11 DIAGNOSIS — N39498 Other specified urinary incontinence: Secondary | ICD-10-CM | POA: Diagnosis not present

## 2022-06-13 DIAGNOSIS — S82891B Other fracture of right lower leg, initial encounter for open fracture type I or II: Secondary | ICD-10-CM | POA: Diagnosis not present

## 2022-06-13 DIAGNOSIS — R41841 Cognitive communication deficit: Secondary | ICD-10-CM | POA: Diagnosis not present

## 2022-06-13 DIAGNOSIS — R262 Difficulty in walking, not elsewhere classified: Secondary | ICD-10-CM | POA: Diagnosis not present

## 2022-06-13 DIAGNOSIS — S82891D Other fracture of right lower leg, subsequent encounter for closed fracture with routine healing: Secondary | ICD-10-CM | POA: Diagnosis not present

## 2022-06-13 DIAGNOSIS — F01A0 Vascular dementia, mild, without behavioral disturbance, psychotic disturbance, mood disturbance, and anxiety: Secondary | ICD-10-CM | POA: Diagnosis not present

## 2022-06-13 DIAGNOSIS — N39498 Other specified urinary incontinence: Secondary | ICD-10-CM | POA: Diagnosis not present

## 2022-06-14 DIAGNOSIS — F01A0 Vascular dementia, mild, without behavioral disturbance, psychotic disturbance, mood disturbance, and anxiety: Secondary | ICD-10-CM | POA: Diagnosis not present

## 2022-06-14 DIAGNOSIS — R262 Difficulty in walking, not elsewhere classified: Secondary | ICD-10-CM | POA: Diagnosis not present

## 2022-06-14 DIAGNOSIS — S82891B Other fracture of right lower leg, initial encounter for open fracture type I or II: Secondary | ICD-10-CM | POA: Diagnosis not present

## 2022-06-14 DIAGNOSIS — S82891D Other fracture of right lower leg, subsequent encounter for closed fracture with routine healing: Secondary | ICD-10-CM | POA: Diagnosis not present

## 2022-06-14 DIAGNOSIS — R41841 Cognitive communication deficit: Secondary | ICD-10-CM | POA: Diagnosis not present

## 2022-06-14 DIAGNOSIS — N39498 Other specified urinary incontinence: Secondary | ICD-10-CM | POA: Diagnosis not present

## 2022-06-17 DIAGNOSIS — S82891D Other fracture of right lower leg, subsequent encounter for closed fracture with routine healing: Secondary | ICD-10-CM | POA: Diagnosis not present

## 2022-06-17 DIAGNOSIS — N39498 Other specified urinary incontinence: Secondary | ICD-10-CM | POA: Diagnosis not present

## 2022-06-17 DIAGNOSIS — S82891B Other fracture of right lower leg, initial encounter for open fracture type I or II: Secondary | ICD-10-CM | POA: Diagnosis not present

## 2022-06-17 DIAGNOSIS — R262 Difficulty in walking, not elsewhere classified: Secondary | ICD-10-CM | POA: Diagnosis not present

## 2022-06-17 DIAGNOSIS — R41841 Cognitive communication deficit: Secondary | ICD-10-CM | POA: Diagnosis not present

## 2022-06-17 DIAGNOSIS — F01A0 Vascular dementia, mild, without behavioral disturbance, psychotic disturbance, mood disturbance, and anxiety: Secondary | ICD-10-CM | POA: Diagnosis not present

## 2022-06-17 NOTE — Progress Notes (Deleted)
Location:  [name]  POS: Clinic  Provider: Royal Hawthorn, ANP  Code Status: DNR Goals of Care:     06/05/2022    3:22 PM  Advanced Directives  Does Patient Have a Medical Advance Directive? Yes  Type of Advance Directive Out of facility DNR (pink MOST or yellow form)  Does patient want to make changes to medical advance directive? No - Patient declined     No chief complaint on file.   HPI: Patient is a 89 y.o. female seen today for medical management of chronic diseases.  PMH: hx of colon ca in 2006 with recurrence. Noted by colonoscopy at [facility] 02/22/21, bx showed well differentiated adenocarcinoma. CT in Jan of 2023 showed no residual disease. Had 4 cycles of pembrolizumab. Colonoscopy 09/20/21 showed no residual mass.    Also hx of RA on Arava, afib off eliquis due to bleeding risk, OAB, TIA, CVA, HTN, ILD, Gerd, GIB, Hypothyroidism, OA, B12 def, mitral and tricuspid valve repair, HLD< anemia, depression, constipation.    Noted mechanical fall 01/30/22 and fractured right ankle. Xray revealed acute displaced distal fibular fracture and non displaced medical malleolar fracture with possible small displaced posterior malleolar fracture. Was in rehab with a prolonged stay. Now back in [place]. Walks with walker. Wears left knee brace due knee buckling and genu valgum deformity of knee.   She was also hospitalized 12/12-12/16/23 due to rectal bleeding and UTI. Thought to be a diverticular bleed.    Incontinence: has urgency, sometimes she can't get to the bathroom in time. Wears a brief all the time. The brief is wet in the morning. She changes it herself. No burning or bladder pain.   Memory loss: MMSE 25/30 112/2023. Followed by Dr. Tomi Likens   Hypothyroidism: currently on synthroid Lab Results  Component Value Date   TSH 3.519 04/17/2022   Follows with Dr Benay Spice for colon ca and anemia  BP 150-180/60-90 in [place] Past Medical History:  Diagnosis Date   Allergy     seasonal   Anemia    Anxiety    Arthritis    Back   Asymptomatic menopausal state    Atrial fibrillation ([place])    Cancer ([place]) 2006   Colon.  Basal Cell Skin cancer- right arm   Cataract    removed bilateral   Chronic atrial fibrillation (HCC)    Colon cancer (HCC)    Constipation due to pain medication therapy    after heart surgery   Deficiency of other specified B group vitamins    Diplopia    Disorientation, unspecified    Dysrhythmia    PAF   GERD (gastroesophageal reflux disease)    Heart murmur    Hyperlipidemia    Hypertension    Hypothyroidism    Malignant neoplasm of colon, unspecified ([place])    Other specified diseases of blood and blood-forming organs    Other specified disorders of bone density and structure, unspecified forearm    Other thrombophilia ([place])    per matrix   Overactive bladder    per Matrix   Personal history of other malignant neoplasm of large intestine    Presbycusis, bilateral    per matrix   RA (rheumatoid arthritis) ([place])    Restless leg    Seizures ([place])    after Heart Surgey   Stroke ([place])    TIA- found by neurologist after    Valgus deformity, not elsewhere classified, right knee    per matrix   Vascular dementia, mild,  without behavioral disturbance, psychotic disturbance, mood disturbance, and anxiety ([place])    per matrix    Past Surgical History:  Procedure Laterality Date   ABDOMINAL HYSTERECTOMY  1970   Partial    COLON RESECTION  2006   cancer   COLON SURGERY     COLONOSCOPY     EYE SURGERY Bilateral    Cataract   MAXIMUM ACCESS (MAS)POSTERIOR LUMBAR INTERBODY FUSION (PLIF) 2 LEVEL N/A 04/12/2015   Procedure: Lumbar Three-Five Decompression, Pedicle Screw Fixation, and Posteriolateral Arthrodesis;  Surgeon: Erline Levine, MD;  Location: [facility];  Service: Neurosurgery;  Laterality: N/A;  L3-4 L4-5 Maximum access posterior lumbar fusion, possible interbodies and resection of synovial cyst at L4-5   MITRAL VALVE  REPAIR  01/20/2013   Gore-tex cords to P1, P2, and P3. Magic suture to posterior medial commisure, #30 Physio 1 ring. Done in [place]   TONSILLECTOMY     about [place]  01/20/2013   #28 TriAd ring done in [place]    Allergies  Allergen Reactions   Claritin [Loratadine] Other (See Comments)    Listed on MAR Unknown reaction   Codeine Other (See Comments)    "just don't take it well"   Gabapentin Other (See Comments)    Dizziness    Molds & Smuts Other (See Comments)    Dust.  Reaction is not listed on MAR    Pollen Extract Swelling    Grass   Bee Venom Swelling and Rash   Wasp Venom Swelling and Rash    Outpatient Encounter Medications as of 06/18/2022  Medication Sig   acetaminophen (TYLENOL) 500 MG tablet Take 500 mg by mouth every 6 (six) hours as needed for mild pain.   atorvastatin (LIPITOR) 20 MG tablet TAKE 1 TABLET DAILY   Cholecalciferol (VITAMIN D) 50 MCG (2000 UT) CAPS Take 2,000 Units by mouth in the morning.   cloNIDine (CATAPRES) 0.1 MG tablet Take 1 tablet (0.1 mg total) by mouth daily as needed. If SBP >180 only   digoxin (LANOXIN) 0.125 MG tablet TAKE 1 TABLET DAILY   Iron, Ferrous Sulfate, 325 (65 Fe) MG TABS Take 325 mg by mouth daily.   leflunomide (ARAVA) 20 MG tablet Take 20 mg by mouth in the morning.   levalbuterol (XOPENEX) 0.63 MG/3ML nebulizer solution Take 3 mLs (0.63 mg total) by nebulization in the morning and at bedtime.   levothyroxine (SYNTHROID) 100 MCG tablet Take 1 tablet (100 mcg total) by mouth daily before breakfast.   melatonin 3 MG TABS tablet Take 1 tablet (3 mg total) by mouth at bedtime as needed.   metoprolol succinate (TOPROL-XL) 50 MG 24 hr tablet TAKE 1 TABLET DAILY   mirtazapine (REMERON) 15 MG tablet TAKE 1 TABLET AT BEDTIME   Multiple Vitamin (MULTIVITAMIN) tablet Take 1 tablet by mouth in the morning.   Multiple Vitamins-Minerals (PRESERVISION AREDS 2) CAPS Take 1 capsule by mouth 2 (two) times daily.    NON FORMULARY Fluid restriction 1800cc daily  1st- 900 cc 2nd- 700 cc 3rd- 200 cc   omeprazole (PRILOSEC) 40 MG capsule TAKE 1 CAPSULE DAILY   ondansetron (ZOFRAN) 4 MG tablet Take 4 mg by mouth every 6 (six) hours as needed for nausea or vomiting. (Patient not taking: Reported on 06/05/2022)   sodium chloride 1 g tablet Take 1 g by mouth daily.   valsartan (DIOVAN) 80 MG tablet Take 80 mg by mouth at bedtime. Report if BP is not consistently <150/90  per MAR   Vibegron (GEMTESA) 75 MG TABS Take 75 mg by mouth daily.   vitamin B-12 (CYANOCOBALAMIN) 1000 MCG tablet Take 1,000 mcg by mouth in the morning.   No facility-administered encounter medications on file as of 06/18/2022.    Review of Systems:  Review of Systems  Constitutional:  Negative for activity change, appetite change, chills, diaphoresis, fatigue, fever and unexpected weight change.  HENT:  Negative for congestion.   Respiratory:  Negative for cough, shortness of breath and wheezing.   Cardiovascular:  Positive for leg swelling. Negative for chest pain and palpitations.  Gastrointestinal:  Negative for abdominal distention, abdominal pain, constipation and diarrhea.  Genitourinary:  Negative for difficulty urinating and dysuria.  Musculoskeletal:  Positive for arthralgias and gait problem (walker). Negative for back pain, joint swelling and myalgias.       Right knee buckling with pain  Neurological:  Negative for dizziness, tremors, seizures, syncope, facial asymmetry, speech difficulty, weakness, light-headedness, numbness and headaches.  Psychiatric/Behavioral:  Negative for agitation, behavioral problems and confusion.        Memory loss  All other systems reviewed and are negative.   Health Maintenance  Topic Date Due   COVID-19 Vaccine (6 - 2023-24 season) 03/26/2022   DTaP/Tdap/Td (2 - Td or Tdap) 04/16/2027   Pneumonia Vaccine 54+ Years old  Completed   INFLUENZA VACCINE  Completed   DEXA SCAN  Completed    Zoster Vaccines- Shingrix  Completed   HPV VACCINES  Aged Out    Physical Exam: There were no vitals filed for this visit.  There is no height or weight on file to calculate BMI. Physical Exam Vitals and nursing note reviewed.  Constitutional:      General: She is not in acute distress.    Appearance: She is not diaphoretic.  HENT:     Head: Normocephalic and atraumatic.  Neck:     Vascular: No JVD.  Cardiovascular:     Rate and Rhythm: Normal rate. Rhythm irregular.     Heart sounds: No murmur heard. Pulmonary:     Effort: Pulmonary effort is normal. No respiratory distress.     Breath sounds: Normal breath sounds. No wheezing.  Musculoskeletal:     Right knee: No ecchymosis. Normal range of motion. ACL laxity present.     Right lower leg: No tenderness or bony tenderness.     Comments: RLE with swelling +1 no warmth not TTP  Skin:    General: Skin is warm and dry.  Neurological:     Mental Status: She is alert and oriented to person, place, and time.     Labs reviewed: Basic Metabolic Panel: Recent Labs    12/07/21 0548 12/20/21 2238 01/06/22 0000 01/30/22 1809 02/03/22 0000 03/08/22 1105 03/09/22 0711 03/10/22 0820 03/15/22 0000 04/17/22 1128  NA 137   < >  --  132*   < > 127* 125* 131* 132*  --   K 4.2   < >  --  4.3   < > 3.9 4.2 4.2 4.7  --   CL 104   < >  --  100   < > 92* 91* 98 100  --   CO2 23   < >  --  22   < > 24 26 26  23*  --   GLUCOSE 90   < >  --  116*   < > 138* 99 97  --   --   BUN 15   < >  --  22   < > 12 24* 26* 16  --   CREATININE 0.79   < >  --  0.88   < > 0.52 0.83 0.75 0.7  --   CALCIUM 9.1   < >  --  9.4   < > 8.6* 8.5* 8.9 8.8  --   MG 1.6*  --   --  1.9  --   --   --   --   --   --   TSH  --    < > 22.70*  --   --   --   --   --  6.07* 3.519   < > = values in this interval not displayed.    Liver Function Tests: Recent Labs    10/03/21 1055 03/06/22 1306 03/07/22 0100  AST 22 18 22   ALT 18 17 16   ALKPHOS 75 76 66   BILITOT 0.8 0.6 0.7  PROT 7.1 6.6 5.8*  ALBUMIN 4.5 3.8 3.1*    No results for input(s): "LIPASE", "AMYLASE" in the last 8760 hours. No results for input(s): "AMMONIA" in the last 8760 hours. CBC: Recent Labs    03/06/22 1306 03/06/22 1930 03/10/22 0820 03/15/22 0000 04/17/22 1128 06/05/22 1432  WBC 9.4   < > 10.1 6.2 7.5 8.9  NEUTROABS 6.9  --   --   --  4.8 5.8  HGB 8.3*   < > 10.1* 9.3* 11.3* 11.4*  HCT 25.4*   < > 30.5* 28* 34.3* 33.9*  MCV 104.1*   < > 102.0*  --  100.9* 100.6*  PLT 193   < > 222 246 186 187   < > = values in this interval not displayed.    Lipid Panel: No results for input(s): "CHOL", "HDL", "LDLCALC", "TRIG", "CHOLHDL", "LDLDIRECT" in the last 8760 hours. Lab Results  Component Value Date   HGBA1C 5.5 01/12/2013    Procedures since last visit: No results found.  Assessment/Plan  1. Closed bimalleolar fracture of right ankle with routine healing, subsequent encounter Healing, ambulating with walker.   2. Acute pain of right knee Tylenol 1 gram bid scheduled x 1 week Has some joint laxity, no significant injury Going to see ortho,wearing brace  3. Overactive bladder Continue  Gemtesa  4. Iron deficiency anemia, unspecified iron deficiency anemia type Lab Results  Component Value Date   HGB 11.4 (L) 06/05/2022   Continue iron   5. Essential hypertension Increase diovan to 80 mg   6. Permanent atrial fibrillation (HCC) Rate is controlled Off DOAC due to bleeding Need digoxin level   7. Rheumatoid arthritis involving hand with negative rheumatoid factor, unspecified laterality ([place]) ON Arava Followed by rheumatology    Labs/tests ordered:  * No order type specified *  Next appt:  2 month f/u with Dr Lyndel Safe  Total time 64min:  time greater than 50% of total time spent doing pt counseling and coordination of care

## 2022-06-18 ENCOUNTER — Encounter: Payer: Medicare Other | Admitting: Adult Health

## 2022-06-18 DIAGNOSIS — Z961 Presence of intraocular lens: Secondary | ICD-10-CM | POA: Diagnosis not present

## 2022-06-18 DIAGNOSIS — H40013 Open angle with borderline findings, low risk, bilateral: Secondary | ICD-10-CM | POA: Diagnosis not present

## 2022-06-18 DIAGNOSIS — H524 Presbyopia: Secondary | ICD-10-CM | POA: Diagnosis not present

## 2022-06-19 DIAGNOSIS — L602 Onychogryphosis: Secondary | ICD-10-CM | POA: Diagnosis not present

## 2022-06-20 NOTE — Progress Notes (Signed)
Cardiology Clinic Note   Patient Name: Kathy Velez Date of Encounter: 06/22/2022  Primary Care Provider:  Virgie Dad, MD Primary Cardiologist:  Kathy Casino, MD  Patient Profile    89 year old female  PAF CHA2DS2-VASc score  4, Atrial fibrillation, aortic atherosclerosis, HTN, carotid artery disease, tricuspid valve surgery 2014, chronic interstitial lung disease, GERD, hypothyroidism, prior colon cancer in remission. Recent admission after bleeding requiring admission for bleeding from the scalp, found to have large hematoma with arterial bleeding requiring acute intervention and associated with significant blood loss requiring PRBC transfusion after a fall.  She has had 2 falls in the prior 6 weeks to admission.   Neurology who would not recommend restarting Eliquis. Neurology is comfortable with aspirin. Other history of mild hyponatremia presumably SIADH baseline 130, hypothyroidism, RA, iron deficiency anemia.   Last seen in the office on 01/19/2022.Seen recently by PCP for UTI on 06/08/2022.   Past Medical History    Past Medical History:  Diagnosis Date   Allergy    seasonal   Anemia    Anxiety    Arthritis    Back   Asymptomatic menopausal state    Atrial fibrillation ([place])    Cancer ([place]) 2006   Colon.  Basal Cell Skin cancer- right arm   Cataract    removed bilateral   Chronic atrial fibrillation (H[facility])    Colon cancer (H[facility])    Constipation due to pain medication therapy    after heart surgery   Deficiency of other specified B group vitamins    Diplopia    Disorientation, unspecified    Dysrhythmia    PAF   GERD (gastroesophageal reflux disease)    Heart murmur    Hyperlipidemia    Hypertension    Hypothyroidism    Malignant neoplasm of colon, unspecified ([place])    Other specified diseases of blood and blood-forming organs    Other specified disorders of bone density and structure, unspecified forearm    Other thrombophilia ([place])    per matrix    Overactive bladder    per Matrix   Personal history of other malignant neoplasm of large intestine    Presbycusis, bilateral    per matrix   RA (rheumatoid arthritis) ([place])    Restless leg    Seizures ([place])    after Heart Surgey   Stroke ([place])    TIA- found by neurologist after    Valgus deformity, not elsewhere classified, right knee    per matrix   Vascular dementia, mild, without behavioral disturbance, psychotic disturbance, mood disturbance, and anxiety ([place])    per matrix   Past Surgical History:  Procedure Laterality Date   ABDOMINAL HYSTERECTOMY  1970   Partial    COLON RESECTION  2006   cancer   COLON SURGERY     COLONOSCOPY     EYE SURGERY Bilateral    Cataract   MAXIMUM ACCESS (MAS)POSTERIOR LUMBAR INTERBODY FUSION (PLIF) 2 LEVEL N/A 04/12/2015   Procedure: Lumbar Three-Five Decompression, Pedicle Screw Fixation, and Posteriolateral Arthrodesis;  Surgeon: Kathy Levine, MD;  Location: [facility];  Service: Neurosurgery;  Laterality: N/A;  L3-4 L4-5 Maximum access posterior lumbar fusion, possible interbodies and resection of synovial cyst at L4-5   MITRAL VALVE REPAIR  01/20/2013   Gore-tex cords to P1, P2, and P3. Magic suture to posterior medial commisure, #30 Physio 1 ring. Done in [place]   TONSILLECTOMY     about [place]  01/20/2013   #28 TriAd ring done in [place]    Allergies  Allergies  Allergen Reactions   Claritin [Loratadine] Other (See Comments)    Listed on MAR Unknown reaction   Codeine Other (See Comments)    "just don't take it well"   Gabapentin Other (See Comments)    Dizziness    Molds & Smuts Other (See Comments)    Dust.  Reaction is not listed on MAR    Pollen Extract Swelling    Grass   Bee Venom Swelling and Rash   Wasp Venom Swelling and Rash    History of Present Illness    Kathy Velez comes today with significant medical history as outlined above. We are seeing her by cardiology for ongoing  assessment and management of permanent atrial fib, HTN and HL She is not a candidate for anticoagulation due to frequent falls and and intracranial bleeding. Followed by neurology who only recommends ASA therapy.  She comes today with her daughter without any complaints.  On questioning she does have some mild dyspnea on exertion but it is short-lived.  She is basically sedentary except for going to a seated exercise class at assisted living facility and walking approximately 50 feet to pick up meal trays and sit with friends in dining room.  She naps each afternoon.  She has been medically compliant.  She denies any significant lower extremity edema.  Approximately once a week she may take a dose of clonidine 0.1 mg for blood pressure greater than 99991111 systolic provided to her by skilled nursing facility staff on blood pressure checks.  Home Medications    Current Outpatient Medications  Medication Sig Dispense Refill   acetaminophen (TYLENOL) 500 MG tablet Take 500 mg by mouth every 6 (six) hours as needed for mild pain.     atorvastatin (LIPITOR) 20 MG tablet TAKE 1 TABLET DAILY 90 tablet 3   Cholecalciferol (VITAMIN D) 50 MCG (2000 UT) CAPS Take 2,000 Units by mouth in the morning.     cloNIDine (CATAPRES) 0.1 MG tablet Take 1 tablet (0.1 mg total) by mouth daily as needed. If SBP >180 only 30 tablet 0   digoxin (LANOXIN) 0.125 MG tablet TAKE 1 TABLET DAILY 90 tablet 3   Iron, Ferrous Sulfate, 325 (65 Fe) MG TABS Take 325 mg by mouth daily. 30 tablet 0   leflunomide (ARAVA) 20 MG tablet Take 20 mg by mouth in the morning.     levalbuterol (XOPENEX) 0.63 MG/3ML nebulizer solution Take 3 mLs (0.63 mg total) by nebulization in the morning and at bedtime. 3 mL 12   levothyroxine (SYNTHROID) 100 MCG tablet Take 1 tablet (100 mcg total) by mouth daily before breakfast. 90 tablet 2   melatonin 3 MG TABS tablet Take 1 tablet (3 mg total) by mouth at bedtime as needed.  0   metoprolol succinate  (TOPROL-XL) 50 MG 24 hr tablet TAKE 1 TABLET DAILY 90 tablet 3   mirtazapine (REMERON) 15 MG tablet TAKE 1 TABLET AT BEDTIME 90 tablet 3   Multiple Vitamin (MULTIVITAMIN) tablet Take 1 tablet by mouth in the morning.     Multiple Vitamins-Minerals (PRESERVISION AREDS 2) CAPS Take 1 capsule by mouth 2 (two) times daily.     NON FORMULARY Fluid restriction 1800cc daily  1st- 900 cc 2nd- 700 cc 3rd- 200 cc     omeprazole (PRILOSEC) 40 MG capsule TAKE 1 CAPSULE DAILY 90 capsule 3   ondansetron (ZOFRAN) 4 MG tablet Take 4 mg by  mouth every 6 (six) hours as needed for nausea or vomiting.     sodium chloride 1 g tablet Take 1 g by mouth daily.     valsartan (DIOVAN) 80 MG tablet Take 80 mg by mouth at bedtime. Report if BP is not consistently <150/90 per MAR     Vibegron (GEMTESA) 75 MG TABS Take 75 mg by mouth daily.     vitamin B-12 (CYANOCOBALAMIN) 1000 MCG tablet Take 1,000 mcg by mouth in the morning.     No current facility-administered medications for this visit.     Family History    Family History  Problem Relation Age of Onset   Lung disease Mother 82   Heart disease Father 63   Colon polyps Daughter    Alcohol abuse Son    Colon cancer Neg Hx    Stomach cancer Neg Hx    Rectal cancer Neg Hx    Crohn's disease Neg Hx    Esophageal cancer Neg Hx    She indicated that her mother is deceased. She indicated that her father is deceased. She indicated that her daughter is alive. She indicated that her son is deceased. She indicated that the status of her neg hx is unknown.  Social History    Social History   Socioeconomic History   Marital status: Widowed    Spouse name: Not on file   Number of children: Not on file   Years of education: Not on file   Highest education level: Not on file  Occupational History   Occupation: elementary school teacher    Comment: retired   Tobacco Use   Smoking status: Never    Passive exposure: Past ("mother smoked")   Smokeless tobacco:  Never  Vaping Use   Vaping Use: Never used  Substance and Sexual Activity   Alcohol use: Yes    Alcohol/week: 1.0 standard drink of alcohol    Types: 1 Glasses of wine per week    Comment: 1-2 per week   Drug use: No   Sexual activity: Not Currently  Other Topics Concern   Not on file  Social History Narrative   Social History      Diet? Healthy- low salt, sugar, fat      Do you drink/eat things with caffeine? On occasion      Marital status?        widow                        What year were you married? 1958      Do you live in a house, apartment, assisted living, condo, trailer, etc.? apartment      Is it one or more stories? one      How many persons live in your home? one      Do you have any pets in your home? (please list) no      Highest level of education completed? 4 year college      Current or past profession: Statistician      Do you exercise?             yes                         Type & how often? Classes- senior retirement community/ some walking      Advanced Directives      Do you have a living will?  Do you have a DNR form?                                  If not, do you want to discuss one?      Do you have signed POA/HPOA for forms?       Functional Status Completed by: Daughter, Di Kindle      Do you have difficulty bathing or dressing yourself? no      Do you have difficulty preparing food or eating? no      Do you have difficulty managing your medications? no      Do you have difficulty managing your finances? no      Do you have difficulty affording your medications? no   Right handed   [facility]   Social Determinants of Health   Financial Resource Strain: Low Risk  (01/28/2018)   Overall Financial Resource Strain (CARDIA)    Difficulty of Paying Living Expenses: Not hard at all  Food Insecurity: No Food Insecurity (03/08/2022)   Hunger Vital Sign    Worried About Running Out of Food in the Last Year: Never  true    Ran Out of Food in the Last Year: Never true  Transportation Needs: No Transportation Needs (03/08/2022)   PRAPARE - Hydrologist (Medical): No    Lack of Transportation (Non-Medical): No  Physical Activity: Sufficiently Active (01/28/2018)   Exercise Vital Sign    Days of Exercise per Week: 6 days    Minutes of Exercise per Session: 30 min  Stress: No Stress Concern Present (01/28/2018)   [place]    Feeling of Stress : Only a little  Social Connections: Moderately Isolated (01/28/2018)   Social Connection and Isolation Panel [NHANES]    Frequency of Communication with Friends and Family: More than three times a week    Frequency of Social Gatherings with Friends and Family: More than three times a week    Attends Religious Services: Never    Marine scientist or Organizations: No    Attends Archivist Meetings: Never    Marital Status: Widowed  Intimate Partner Violence: Not At Risk (03/08/2022)   Humiliation, Afraid, Rape, and Kick questionnaire    Fear of Current or Ex-Partner: No    Emotionally Abused: No    Physically Abused: No    Sexually Abused: No     Review of Systems    General:  No chills, fever, night sweats or weight changes.  Cardiovascular:  No chest pain, dyspnea on exertion, edema, orthopnea, palpitations, paroxysmal nocturnal dyspnea. Dermatological: No rash, lesions/masses Respiratory: No cough, dyspnea Urologic: No hematuria, dysuria Abdominal:   No nausea, vomiting, diarrhea, bright red blood per rectum, melena, or hematemesis Neurologic:  No visual changes, wkns, changes in mental status. All other systems reviewed and are otherwise negative except as noted above.     Physical Exam    VS:  BP (!) 152/84 (BP Location: Left Arm, Patient Position: Sitting, Cuff Size: Normal)   Pulse 70   Ht 5\' 2"  (1.575 m)   Wt 125 lb 9.6 oz (57 kg)    SpO2 95%   BMI 22.97 kg/m  , BMI Body mass index is 22.97 kg/m.     GEN: Well nourished, well developed, in no acute distress. HEENT: normal. Neck: Supple, no JVD, carotid bruits, or  masses. Cardiac: IRRR, 2/6 systolic murmurs, rubs, or gallops. No clubbing, cyanosis, edema.  Radials/DP/PT 2+ and equal bilaterally.  Respiratory:  Respirations regular and unlabored, clear to auscultation bilaterally. GI: Soft, nontender, nondistended, BS + x 4. MS: no deformity or atrophy.  Wearing knee brace to the left knee which required adjusting during physical exam. Skin: warm and dry, no rash. Neuro:  Strength and sensation are intact. Psych: Normal affect.  Accessory Clinical Findings    ECG personally reviewed by me today-not completed per office visit today  Lab Results  Component Value Date   WBC 8.9 06/05/2022   HGB 11.4 (L) 06/05/2022   HCT 33.9 (L) 06/05/2022   MCV 100.6 (H) 06/05/2022   PLT 187 06/05/2022   Lab Results  Component Value Date   CREATININE 0.7 03/15/2022   BUN 16 03/15/2022   NA 132 (A) 03/15/2022   K 4.7 03/15/2022   CL 100 03/15/2022   CO2 23 (A) 03/15/2022   Lab Results  Component Value Date   ALT 16 03/07/2022   AST 22 03/07/2022   ALKPHOS 66 03/07/2022   BILITOT 0.7 03/07/2022   Lab Results  Component Value Date   CHOL 115 02/02/2021   HDL 48 02/02/2021   LDLCALC 49 02/02/2021   TRIG 91 02/02/2021    Lab Results  Component Value Date   HGBA1C 5.5 01/12/2013    Review of Prior Studies: Echocardiogram 11/27/2018:  1. The left ventricle has normal systolic function with an ejection  fraction of 60-65%. The cavity size was normal. There is mildly increased  left ventricular wall thickness. Left ventricular diastolic function could  not be evaluated secondary to atrial   fibrillation. No evidence of left ventricular regional wall motion  abnormalities.   2. Left atrial size was severely dilated.   3. A 30 mm PhysioRing valve is present in the  mitral position.   4. Status post tricuspid valve repair. 28 mm TriAd annuloplasty ring is  present in the tricuspid position.   5. The tricuspid valve is abnormal.   6. The aortic valve is tricuspid. Mild sclerosis of the aortic valve. No  stenosis of the aortic valve.   7. The aorta is normal unless otherwise noted.   8. The inferior vena cava was normal in size with <50% respiratory  variability.   SUMMARY     LVEF 60-65%, mild LVH, normal wall motion, midly reduced RV systolic  function, severe LAE, s/p 30 mm Physio-Ring MV repair with no  stenosis and mild MR, s/p 29 mm TriAd annuloplasty ring, no stenosis  and trivial TR, aortic valve sclerosis, IVC suggests elevated RA  pressure of 8 mmHg.   Assessment & Plan   1.  Permanent atrial fibrillation: She is on digoxin and metoprolol 50 mg daily.  Heart rate is well-controlled today.  She is not on anticoagulation therapy due to SDH, frequent falls.  Checking dig level and BMET .  2.  Hypertension: Slightly elevated today on initial evaluation.  Did recheck and found it to be 144/78.  Continue current medication regimen.  Would not want to drop her blood pressure any lower to prevent falls.  She does have as needed clonidine for systolic blood pressure greater than 180.  Has taken this approximately once a week     3.  Iron deficiency anemia: Followed by oncology  Signed, Phill Myron. West Pugh, ANP, [place]   06/22/2022 5:38 PM      Office [phone] Fax [phone]  Notice: This dictation was prepared with Dragon dictation along with smaller phrase technology. Any transcriptional errors that result from this process are unintentional and may not be corrected upon review.

## 2022-06-21 DIAGNOSIS — F01A0 Vascular dementia, mild, without behavioral disturbance, psychotic disturbance, mood disturbance, and anxiety: Secondary | ICD-10-CM | POA: Diagnosis not present

## 2022-06-21 DIAGNOSIS — R41841 Cognitive communication deficit: Secondary | ICD-10-CM | POA: Diagnosis not present

## 2022-06-21 DIAGNOSIS — N39498 Other specified urinary incontinence: Secondary | ICD-10-CM | POA: Diagnosis not present

## 2022-06-21 DIAGNOSIS — R262 Difficulty in walking, not elsewhere classified: Secondary | ICD-10-CM | POA: Diagnosis not present

## 2022-06-21 DIAGNOSIS — S82891B Other fracture of right lower leg, initial encounter for open fracture type I or II: Secondary | ICD-10-CM | POA: Diagnosis not present

## 2022-06-21 DIAGNOSIS — S82891D Other fracture of right lower leg, subsequent encounter for closed fracture with routine healing: Secondary | ICD-10-CM | POA: Diagnosis not present

## 2022-06-22 ENCOUNTER — Encounter: Payer: Self-pay | Admitting: Adult Health

## 2022-06-22 ENCOUNTER — Telehealth: Payer: Self-pay

## 2022-06-22 ENCOUNTER — Ambulatory Visit: Payer: Medicare Other | Attending: Internal Medicine | Admitting: Adult Health

## 2022-06-22 VITALS — BP 152/84 | HR 70 | Ht 62.0 in | Wt 125.6 lb

## 2022-06-22 DIAGNOSIS — I4821 Permanent atrial fibrillation: Secondary | ICD-10-CM | POA: Diagnosis not present

## 2022-06-22 DIAGNOSIS — N39498 Other specified urinary incontinence: Secondary | ICD-10-CM | POA: Diagnosis not present

## 2022-06-22 DIAGNOSIS — Z79899 Other long term (current) drug therapy: Secondary | ICD-10-CM | POA: Insufficient documentation

## 2022-06-22 DIAGNOSIS — I10 Essential (primary) hypertension: Secondary | ICD-10-CM | POA: Insufficient documentation

## 2022-06-22 DIAGNOSIS — S82891D Other fracture of right lower leg, subsequent encounter for closed fracture with routine healing: Secondary | ICD-10-CM | POA: Diagnosis not present

## 2022-06-22 DIAGNOSIS — S82891B Other fracture of right lower leg, initial encounter for open fracture type I or II: Secondary | ICD-10-CM | POA: Diagnosis not present

## 2022-06-22 DIAGNOSIS — R262 Difficulty in walking, not elsewhere classified: Secondary | ICD-10-CM | POA: Diagnosis not present

## 2022-06-22 DIAGNOSIS — F01A0 Vascular dementia, mild, without behavioral disturbance, psychotic disturbance, mood disturbance, and anxiety: Secondary | ICD-10-CM | POA: Diagnosis not present

## 2022-06-22 DIAGNOSIS — R41841 Cognitive communication deficit: Secondary | ICD-10-CM | POA: Diagnosis not present

## 2022-06-22 NOTE — Telephone Encounter (Signed)
No objections

## 2022-06-22 NOTE — Patient Instructions (Signed)
Medication Instructions:  Your physician recommends that you continue on your current medications as directed. Please refer to the Current Medication list given to you today.   *If you need a refill on your cardiac medications before your next appointment, please call your pharmacy*   Lab Work: Your physician recommends that you have lab today:  BMET Digoxin Level   If you have labs (blood work) drawn today and your tests are completely normal, you will receive your results only by: [place] (if you have MyChart) OR A paper copy in the mail If you have any lab test that is abnormal or we need to change your treatment, we will call you to review the results.   Testing/Procedures: None ordered   Follow-Up: At [facility], you and your health needs are our priority.  As part of our continuing mission to provide you with exceptional heart care, we have created designated Provider Care Teams.  These Care Teams include your primary Cardiologist (physician) and Advanced Practice Providers (APPs -  Physician Assistants and Nurse Practitioners) who all work together to provide you with the care you need, when you need it.  We recommend signing up for the patient portal called "MyChart".  Sign up information is provided on this After Visit Summary.  MyChart is used to connect with patients for Virtual Visits (Telemedicine).  Patients are able to view lab/test results, encounter notes, upcoming appointments, etc.  Non-urgent messages can be sent to your provider as well.   To learn more about what you can do with MyChart, go to [email or web address].    Your next appointment:   6 month(s)  Provider:   Pixie Casino, MD

## 2022-06-22 NOTE — Telephone Encounter (Signed)
Per chart review, last visit with Dr. Debara Pickett was >3 years ago.

## 2022-06-22 NOTE — Telephone Encounter (Signed)
Pt was seen in office with Jory Sims NP today 06/22/22. Pt would like to switch providers. Pt is currently a pt of Dr. Lysbeth Penner and would like to become a pt of Dr. Sallyanne Kuster. Please advise.

## 2022-06-23 DIAGNOSIS — R262 Difficulty in walking, not elsewhere classified: Secondary | ICD-10-CM | POA: Diagnosis not present

## 2022-06-23 DIAGNOSIS — R41841 Cognitive communication deficit: Secondary | ICD-10-CM | POA: Diagnosis not present

## 2022-06-23 DIAGNOSIS — S82891B Other fracture of right lower leg, initial encounter for open fracture type I or II: Secondary | ICD-10-CM | POA: Diagnosis not present

## 2022-06-23 DIAGNOSIS — S82891D Other fracture of right lower leg, subsequent encounter for closed fracture with routine healing: Secondary | ICD-10-CM | POA: Diagnosis not present

## 2022-06-23 DIAGNOSIS — F01A0 Vascular dementia, mild, without behavioral disturbance, psychotic disturbance, mood disturbance, and anxiety: Secondary | ICD-10-CM | POA: Diagnosis not present

## 2022-06-23 DIAGNOSIS — N39498 Other specified urinary incontinence: Secondary | ICD-10-CM | POA: Diagnosis not present

## 2022-06-23 LAB — BASIC METABOLIC PANEL
BUN/Creatinine Ratio: 25 (ref 12–28)
BUN: 18 mg/dL (ref 8–27)
CO2: 22 mmol/L (ref 20–29)
Calcium: 9.5 mg/dL (ref 8.7–10.3)
Chloride: 99 mmol/L (ref 96–106)
Creatinine, Ser: 0.72 mg/dL (ref 0.57–1.00)
Glucose: 100 mg/dL — ABNORMAL HIGH (ref 70–99)
Potassium: 4.7 mmol/L (ref 3.5–5.2)
Sodium: 136 mmol/L (ref 134–144)
eGFR: 80 mL/min/{1.73_m2} (ref >59)

## 2022-06-23 LAB — DIGOXIN LEVEL: Digoxin, Serum: 0.7 ng/mL (ref 0.5–0.9)

## 2022-06-25 ENCOUNTER — Encounter: Payer: Medicare Other | Admitting: Adult Health

## 2022-06-25 DIAGNOSIS — S82891D Other fracture of right lower leg, subsequent encounter for closed fracture with routine healing: Secondary | ICD-10-CM | POA: Diagnosis not present

## 2022-06-25 DIAGNOSIS — I4821 Permanent atrial fibrillation: Secondary | ICD-10-CM | POA: Diagnosis not present

## 2022-06-25 DIAGNOSIS — N39498 Other specified urinary incontinence: Secondary | ICD-10-CM | POA: Diagnosis not present

## 2022-06-25 DIAGNOSIS — M6389 Disorders of muscle in diseases classified elsewhere, multiple sites: Secondary | ICD-10-CM | POA: Diagnosis not present

## 2022-06-25 DIAGNOSIS — Z9181 History of falling: Secondary | ICD-10-CM | POA: Diagnosis not present

## 2022-06-25 DIAGNOSIS — R4189 Other symptoms and signs involving cognitive functions and awareness: Secondary | ICD-10-CM | POA: Diagnosis not present

## 2022-06-25 DIAGNOSIS — R41841 Cognitive communication deficit: Secondary | ICD-10-CM | POA: Diagnosis not present

## 2022-06-25 DIAGNOSIS — R278 Other lack of coordination: Secondary | ICD-10-CM | POA: Diagnosis not present

## 2022-06-25 DIAGNOSIS — F01A0 Vascular dementia, mild, without behavioral disturbance, psychotic disturbance, mood disturbance, and anxiety: Secondary | ICD-10-CM | POA: Diagnosis not present

## 2022-06-26 DIAGNOSIS — N39498 Other specified urinary incontinence: Secondary | ICD-10-CM | POA: Diagnosis not present

## 2022-06-26 DIAGNOSIS — R41841 Cognitive communication deficit: Secondary | ICD-10-CM | POA: Diagnosis not present

## 2022-06-26 DIAGNOSIS — F01A0 Vascular dementia, mild, without behavioral disturbance, psychotic disturbance, mood disturbance, and anxiety: Secondary | ICD-10-CM | POA: Diagnosis not present

## 2022-06-26 DIAGNOSIS — I4821 Permanent atrial fibrillation: Secondary | ICD-10-CM | POA: Diagnosis not present

## 2022-06-26 DIAGNOSIS — Z9181 History of falling: Secondary | ICD-10-CM | POA: Diagnosis not present

## 2022-06-26 DIAGNOSIS — S82891D Other fracture of right lower leg, subsequent encounter for closed fracture with routine healing: Secondary | ICD-10-CM | POA: Diagnosis not present

## 2022-06-26 NOTE — Telephone Encounter (Signed)
Noted. Lmom asking pts daughter to call back and schedule 6 month appointment with Dr. Sallyanne Kuster.

## 2022-06-28 DIAGNOSIS — F01A0 Vascular dementia, mild, without behavioral disturbance, psychotic disturbance, mood disturbance, and anxiety: Secondary | ICD-10-CM | POA: Diagnosis not present

## 2022-06-28 DIAGNOSIS — N39498 Other specified urinary incontinence: Secondary | ICD-10-CM | POA: Diagnosis not present

## 2022-06-28 DIAGNOSIS — R41841 Cognitive communication deficit: Secondary | ICD-10-CM | POA: Diagnosis not present

## 2022-06-28 DIAGNOSIS — I4821 Permanent atrial fibrillation: Secondary | ICD-10-CM | POA: Diagnosis not present

## 2022-06-28 DIAGNOSIS — Z9181 History of falling: Secondary | ICD-10-CM | POA: Diagnosis not present

## 2022-06-28 DIAGNOSIS — S82891D Other fracture of right lower leg, subsequent encounter for closed fracture with routine healing: Secondary | ICD-10-CM | POA: Diagnosis not present

## 2022-06-29 DIAGNOSIS — S82891D Other fracture of right lower leg, subsequent encounter for closed fracture with routine healing: Secondary | ICD-10-CM | POA: Diagnosis not present

## 2022-06-29 DIAGNOSIS — N39498 Other specified urinary incontinence: Secondary | ICD-10-CM | POA: Diagnosis not present

## 2022-06-29 DIAGNOSIS — R41841 Cognitive communication deficit: Secondary | ICD-10-CM | POA: Diagnosis not present

## 2022-06-29 DIAGNOSIS — I4821 Permanent atrial fibrillation: Secondary | ICD-10-CM | POA: Diagnosis not present

## 2022-06-29 DIAGNOSIS — Z9181 History of falling: Secondary | ICD-10-CM | POA: Diagnosis not present

## 2022-06-29 DIAGNOSIS — F01A0 Vascular dementia, mild, without behavioral disturbance, psychotic disturbance, mood disturbance, and anxiety: Secondary | ICD-10-CM | POA: Diagnosis not present

## 2022-07-02 ENCOUNTER — Encounter: Payer: Medicare Other | Admitting: Adult Health

## 2022-07-03 ENCOUNTER — Ambulatory Visit: Payer: Medicare Other | Admitting: Neurology

## 2022-07-04 DIAGNOSIS — S82891D Other fracture of right lower leg, subsequent encounter for closed fracture with routine healing: Secondary | ICD-10-CM | POA: Diagnosis not present

## 2022-07-04 DIAGNOSIS — F01A0 Vascular dementia, mild, without behavioral disturbance, psychotic disturbance, mood disturbance, and anxiety: Secondary | ICD-10-CM | POA: Diagnosis not present

## 2022-07-04 DIAGNOSIS — R41841 Cognitive communication deficit: Secondary | ICD-10-CM | POA: Diagnosis not present

## 2022-07-04 DIAGNOSIS — N39498 Other specified urinary incontinence: Secondary | ICD-10-CM | POA: Diagnosis not present

## 2022-07-04 DIAGNOSIS — Z9181 History of falling: Secondary | ICD-10-CM | POA: Diagnosis not present

## 2022-07-04 DIAGNOSIS — I4821 Permanent atrial fibrillation: Secondary | ICD-10-CM | POA: Diagnosis not present

## 2022-07-05 DIAGNOSIS — F01A0 Vascular dementia, mild, without behavioral disturbance, psychotic disturbance, mood disturbance, and anxiety: Secondary | ICD-10-CM | POA: Diagnosis not present

## 2022-07-05 DIAGNOSIS — M5136 Other intervertebral disc degeneration, lumbar region: Secondary | ICD-10-CM | POA: Diagnosis not present

## 2022-07-05 DIAGNOSIS — Z9181 History of falling: Secondary | ICD-10-CM | POA: Diagnosis not present

## 2022-07-05 DIAGNOSIS — S82891D Other fracture of right lower leg, subsequent encounter for closed fracture with routine healing: Secondary | ICD-10-CM | POA: Diagnosis not present

## 2022-07-05 DIAGNOSIS — M8580 Other specified disorders of bone density and structure, unspecified site: Secondary | ICD-10-CM | POA: Diagnosis not present

## 2022-07-05 DIAGNOSIS — I4821 Permanent atrial fibrillation: Secondary | ICD-10-CM | POA: Diagnosis not present

## 2022-07-05 DIAGNOSIS — N39498 Other specified urinary incontinence: Secondary | ICD-10-CM | POA: Diagnosis not present

## 2022-07-05 DIAGNOSIS — M1991 Primary osteoarthritis, unspecified site: Secondary | ICD-10-CM | POA: Diagnosis not present

## 2022-07-05 DIAGNOSIS — Z79899 Other long term (current) drug therapy: Secondary | ICD-10-CM | POA: Diagnosis not present

## 2022-07-05 DIAGNOSIS — M0579 Rheumatoid arthritis with rheumatoid factor of multiple sites without organ or systems involvement: Secondary | ICD-10-CM | POA: Diagnosis not present

## 2022-07-05 DIAGNOSIS — Z6823 Body mass index (BMI) 23.0-23.9, adult: Secondary | ICD-10-CM | POA: Diagnosis not present

## 2022-07-05 DIAGNOSIS — R41841 Cognitive communication deficit: Secondary | ICD-10-CM | POA: Diagnosis not present

## 2022-07-06 DIAGNOSIS — N39498 Other specified urinary incontinence: Secondary | ICD-10-CM | POA: Diagnosis not present

## 2022-07-06 DIAGNOSIS — S82891D Other fracture of right lower leg, subsequent encounter for closed fracture with routine healing: Secondary | ICD-10-CM | POA: Diagnosis not present

## 2022-07-06 DIAGNOSIS — I4821 Permanent atrial fibrillation: Secondary | ICD-10-CM | POA: Diagnosis not present

## 2022-07-06 DIAGNOSIS — R41841 Cognitive communication deficit: Secondary | ICD-10-CM | POA: Diagnosis not present

## 2022-07-06 DIAGNOSIS — F01A0 Vascular dementia, mild, without behavioral disturbance, psychotic disturbance, mood disturbance, and anxiety: Secondary | ICD-10-CM | POA: Diagnosis not present

## 2022-07-06 DIAGNOSIS — Z9181 History of falling: Secondary | ICD-10-CM | POA: Diagnosis not present

## 2022-07-09 ENCOUNTER — Encounter: Payer: Self-pay | Admitting: Adult Health

## 2022-07-09 ENCOUNTER — Non-Acute Institutional Stay: Payer: Medicare Other | Admitting: Adult Health

## 2022-07-09 VITALS — BP 154/76 | HR 93 | Temp 97.6°F | Resp 18 | Ht 62.0 in | Wt 125.6 lb

## 2022-07-09 DIAGNOSIS — I4821 Permanent atrial fibrillation: Secondary | ICD-10-CM | POA: Diagnosis not present

## 2022-07-09 DIAGNOSIS — M06049 Rheumatoid arthritis without rheumatoid factor, unspecified hand: Secondary | ICD-10-CM

## 2022-07-09 DIAGNOSIS — E871 Hypo-osmolality and hyponatremia: Secondary | ICD-10-CM | POA: Diagnosis not present

## 2022-07-09 DIAGNOSIS — N3281 Overactive bladder: Secondary | ICD-10-CM

## 2022-07-09 DIAGNOSIS — D509 Iron deficiency anemia, unspecified: Secondary | ICD-10-CM

## 2022-07-09 DIAGNOSIS — M25561 Pain in right knee: Secondary | ICD-10-CM

## 2022-07-09 DIAGNOSIS — I10 Essential (primary) hypertension: Secondary | ICD-10-CM

## 2022-07-09 MED ORDER — VALSARTAN 160 MG PO TABS: 160.0000 mg | ORAL_TABLET | Freq: Every day | ORAL | 0 refills | Status: DC

## 2022-07-09 NOTE — Progress Notes (Addendum)
Location:  [name]  POS: Clinic  Provider: Fletcher Anon, ANP  Code Status: DNR Goals of Care:     07/09/2022    1:44 PM  Advanced Directives  Does Patient Have a Medical Advance Directive? Yes  Type of Estate agent of [place];Living will;Out of facility DNR (pink MOST or yellow form)  Does patient want to make changes to medical advance directive? No - Patient declined  Copy of Healthcare Power of Attorney in Chart? Yes - validated most recent copy scanned in chart (See row information)  Pre-existing out of facility DNR order (yellow form or pink MOST form) Yellow form placed in chart (order not valid for inpatient use)     Chief Complaint  Patient presents with   Medical Management of Chronic Issues    Patient is being seen for a follow up.. patient has been having some problems with her knee making her fall     HPI: Patient is a 89 y.o. female seen today for medical management of chronic diseases.  PMH: hx of colon ca in 2006 with recurrence. Noted by colonoscopy at [facility] 02/22/21, bx showed well differentiated adenocarcinoma. CT in Jan of 2023 showed no residual disease. Had 4 cycles of pembrolizumab. Colonoscopy 09/20/21 showed no residual mass.    Also hx of RA on Arava, afib off eliquis due to bleeding risk, OAB, TIA, CVA, HTN, ILD, Gerd, GIB, Hypothyroidism, OA, B12 def, mitral and tricuspid valve repair, HLD< anemia, depression, constipation.    She was discharged from skilled rehab back [place] after a prolonged stay due to a mechanical fall 01/30/22 and fractured right ankle. Xray revealed acute displaced distal fibular fracture and non displaced medical malleolar fracture with possible small displaced posterior malleolar fracture. She had been in a cast and boot.   Ambulatory with a walker. Frequent falls per staff.   Followed by ortho for right knee pain, falls, knee buckling, genu valgum deformity. Nurse requesting scheduled tylenol  for pain  Incontinence: has frequency and incontinence. Sleeps through most of the night. Wakes up wet. Has staff for assistance and reminders for incontinence care.   Memory loss: MMSE 23/30 03/15/2022. Followed by Dr. Everlena Cooper   Hypothyroidism: currently on synthroid Lab Results  Component Value Date   TSH 3.519 04/17/2022   Follows with Dr Truett Perna for colon ca and anemia  BP: elevated to 170s Has some right leg edema, sob on exertion unchanged per pt No palpitations or sob  Afib off DOAC due to bleeding, falls.  Past Medical History:  Diagnosis Date   Allergy    seasonal   Anemia    Anxiety    Arthritis    Back   Asymptomatic menopausal state    Atrial fibrillation    Cancer 2006   Colon.  Basal Cell Skin cancer- right arm   Cataract    removed bilateral   Chronic atrial fibrillation    Colon cancer    Constipation due to pain medication therapy    after heart surgery   Deficiency of other specified B group vitamins    Diplopia    Disorientation, unspecified    Dysrhythmia    PAF   GERD (gastroesophageal reflux disease)    Heart murmur    Hyperlipidemia    Hypertension    Hypothyroidism    Malignant neoplasm of colon, unspecified    Other specified diseases of blood and blood-forming organs    Other specified disorders of bone density and structure, unspecified  forearm    Other thrombophilia    per matrix   Overactive bladder    per Matrix   Personal history of other malignant neoplasm of large intestine    Presbycusis, bilateral    per matrix   RA (rheumatoid arthritis)    Restless leg    Seizures    after Heart Surgey   Stroke    TIA- found by neurologist after    Valgus deformity, not elsewhere classified, right knee    per matrix   Vascular dementia, mild, without behavioral disturbance, psychotic disturbance, mood disturbance, and anxiety    per matrix    Past Surgical History:  Procedure Laterality Date   ABDOMINAL HYSTERECTOMY  1970    Partial    COLON RESECTION  2006   cancer   COLON SURGERY     COLONOSCOPY     EYE SURGERY Bilateral    Cataract   MAXIMUM ACCESS (MAS)POSTERIOR LUMBAR INTERBODY FUSION (PLIF) 2 LEVEL N/A 04/12/2015   Procedure: Lumbar Three-Five Decompression, Pedicle Screw Fixation, and Posteriolateral Arthrodesis;  Surgeon: Maeola Harman, MD;  Location: [facility];  Service: Neurosurgery;  Laterality: N/A;  L3-4 L4-5 Maximum access posterior lumbar fusion, possible interbodies and resection of synovial cyst at L4-5   MITRAL VALVE REPAIR  01/20/2013   Gore-tex cords to P1, P2, and P3. Magic suture to posterior medial commisure, #30 Physio 1 ring. Done in [place]   TONSILLECTOMY     about 1940   TRICUSPID VALVE SURGERY  01/20/2013   #28 TriAd ring done in [place]    Allergies  Allergen Reactions   Claritin [Loratadine] Other (See Comments)    Listed on MAR Unknown reaction   Codeine Other (See Comments)    "just don't take it well"   Gabapentin Other (See Comments)    Dizziness    Molds & Smuts Other (See Comments)    Dust.  Reaction is not listed on MAR    Pollen Extract Swelling    Grass   Bee Venom Swelling and Rash   Wasp Venom Swelling and Rash    Outpatient Encounter Medications as of 07/09/2022  Medication Sig   acetaminophen (TYLENOL) 500 MG tablet Take 1,000 mg by mouth in the morning and at bedtime.   atorvastatin (LIPITOR) 20 MG tablet TAKE 1 TABLET DAILY   Cholecalciferol (VITAMIN D) 50 MCG (2000 UT) CAPS Take 2,000 Units by mouth in the morning.   cloNIDine (CATAPRES) 0.1 MG tablet Take 1 tablet (0.1 mg total) by mouth daily as needed. If SBP >180 only   digoxin (LANOXIN) 0.125 MG tablet TAKE 1 TABLET DAILY   Iron, Ferrous Sulfate, 325 (65 Fe) MG TABS Take 325 mg by mouth daily.   leflunomide (ARAVA) 20 MG tablet Take 20 mg by mouth in the morning.   levalbuterol (XOPENEX) 0.63 MG/3ML nebulizer solution Take 3 mLs (0.63 mg total) by nebulization in the morning and at bedtime.    levothyroxine (SYNTHROID) 100 MCG tablet Take 1 tablet (100 mcg total) by mouth daily before breakfast.   melatonin 3 MG TABS tablet Take 1 tablet (3 mg total) by mouth at bedtime as needed.   metoprolol succinate (TOPROL-XL) 50 MG 24 hr tablet TAKE 1 TABLET DAILY   mirtazapine (REMERON) 15 MG tablet TAKE 1 TABLET AT BEDTIME   Multiple Vitamin (MULTIVITAMIN) tablet Take 1 tablet by mouth in the morning.   Multiple Vitamins-Minerals (PRESERVISION AREDS 2) CAPS Take 1 capsule by mouth 2 (two) times daily.   NON  FORMULARY Fluid restriction 1800cc daily  1st- 900 cc 2nd- 700 cc 3rd- 200 cc   omeprazole (PRILOSEC) 40 MG capsule TAKE 1 CAPSULE DAILY   sodium chloride 1 g tablet Take 1 g by mouth daily.   Vibegron (GEMTESA) 75 MG TABS Take 75 mg by mouth daily.   vitamin B-12 (CYANOCOBALAMIN) 1000 MCG tablet Take 1,000 mcg by mouth in the morning.   [DISCONTINUED] valsartan (DIOVAN) 40 MG tablet Take 40 mg by mouth daily. Increase to 80mg  PO daily and report if B/p is not consistently<150/90   [DISCONTINUED] valsartan (DIOVAN) 80 MG tablet Take 80 mg by mouth at bedtime. Report if BP is not consistently <150/90 per MAR   ondansetron (ZOFRAN) 4 MG tablet Take 4 mg by mouth every 6 (six) hours as needed for nausea or vomiting.   valsartan (DIOVAN) 160 MG tablet Take 1 tablet (160 mg total) by mouth daily.   No facility-administered encounter medications on file as of 07/09/2022.    Review of Systems:  Review of Systems  Constitutional:  Negative for activity change, appetite change, chills, diaphoresis, fatigue, fever and unexpected weight change.  HENT:  Negative for congestion.   Respiratory:  Negative for cough, shortness of breath and wheezing.   Cardiovascular:  Positive for leg swelling. Negative for chest pain and palpitations.  Gastrointestinal:  Negative for abdominal distention, abdominal pain, constipation and diarrhea.  Genitourinary:  Negative for difficulty urinating and dysuria.   Musculoskeletal:  Positive for arthralgias and gait problem (walker). Negative for back pain, joint swelling and myalgias.       Right knee buckling with pain  Neurological:  Negative for dizziness, tremors, seizures, syncope, facial asymmetry, speech difficulty, weakness, light-headedness, numbness and headaches.  Psychiatric/Behavioral:  Negative for agitation, behavioral problems and confusion.        Memory loss  All other systems reviewed and are negative.   Health Maintenance  Topic Date Due   COVID-19 Vaccine (6 - 2023-24 season) 07/25/2022 (Originally 03/26/2022)   INFLUENZA VACCINE  10/25/2022   DTaP/Tdap/Td (2 - Td or Tdap) 04/16/2027   Pneumonia Vaccine 47+ Years old  Completed   DEXA SCAN  Completed   Zoster Vaccines- Shingrix  Completed   HPV VACCINES  Aged Out    Physical Exam: Vitals:   07/09/22 1342 07/09/22 1346  BP: (!) 160/76 (!) 154/76  Pulse: 93   Resp: 18   Temp: 97.6 F (36.4 C)   TempSrc: Temporal   SpO2: 99%   Weight: 125 lb 9.6 oz (57 kg)   Height: 5\' 2"  (1.575 m)    Body mass index is 22.97 kg/m. Physical Exam Vitals and nursing note reviewed.  Constitutional:      General: She is not in acute distress.    Appearance: She is not diaphoretic.  HENT:     Head: Normocephalic and atraumatic.  Neck:     Vascular: No JVD.  Cardiovascular:     Rate and Rhythm: Normal rate. Rhythm irregular.     Heart sounds: No murmur heard. Pulmonary:     Effort: Pulmonary effort is normal. No respiratory distress.     Breath sounds: Normal breath sounds. No wheezing.  Musculoskeletal:     Right knee: No ecchymosis. Normal range of motion. ACL laxity present.     Right lower leg: No tenderness or bony tenderness.     Comments: RLE with swelling +1 no warmth not TTP  Skin:    General: Skin is warm and dry.  Neurological:  Mental Status: She is alert and oriented to person, place, and time.     Labs reviewed: Basic Metabolic Panel: Recent Labs     12/07/21 0548 12/20/21 2238 01/06/22 0000 01/30/22 1809 02/03/22 0000 03/09/22 0711 03/10/22 0820 03/15/22 0000 04/17/22 1128 06/22/22 1619  NA 137   < >  --  132*   < > 125* 131* 132*  --  136  K 4.2   < >  --  4.3   < > 4.2 4.2 4.7  --  4.7  CL 104   < >  --  100   < > 91* 98 100  --  99  CO2 23   < >  --  22   < > 26 26 23*  --  22  GLUCOSE 90   < >  --  116*   < > 99 97  --   --  100*  BUN 15   < >  --  22   < > 24* 26* 16  --  18  CREATININE 0.79   < >  --  0.88   < > 0.83 0.75 0.7  --  0.72  CALCIUM 9.1   < >  --  9.4   < > 8.5* 8.9 8.8  --  9.5  MG 1.6*  --   --  1.9  --   --   --   --   --   --   TSH  --    < > 22.70*  --   --   --   --  6.07* 3.519  --    < > = values in this interval not displayed.   Liver Function Tests: Recent Labs    10/03/21 1055 03/06/22 1306 03/07/22 0100  AST ALT ALKPHOS 75 76 66  BILITOT 0.8 0.6 0.7  PROT 7.1 6.6 5.8*  ALBUMIN 4.5 3.8 3.1*   No results for input(s): "LIPASE", "AMYLASE" in the last 8760 hours. No results for input(s): "AMMONIA" in the last 8760 hours. CBC: Recent Labs    03/06/22 1306 03/06/22 1930 03/10/22 0820 03/15/22 0000 04/17/22 1128 06/05/22 1432  WBC 9.4   < > 10.1 6.2 7.5 8.9  NEUTROABS 6.9  --   --   --  4.8 5.8  HGB 8.3*   < > 10.1* 9.3* 11.3* 11.4*  HCT 25.4*   < > 30.5* 28* 34.3* 33.9*  MCV 104.1*   < > 102.0*  --  100.9* 100.6*  PLT 193   < > 222 246 186 187   < > = values in this interval not displayed.   Lipid Panel: No results for input(s): "CHOL", "HDL", "LDLCALC", "TRIG", "CHOLHDL", "LDLDIRECT" in the last 8760 hours. Lab Results  Component Value Date   HGBA1C 5.5 01/12/2013    Procedures since last visit: No results found.  Assessment/Plan  1. Acute pain of right knee With genu valgum Followed by ortho Has PT ordered Using brace Change tylenol to 1 gram bid scheduled per nurse request for pain  2. Overactive bladder With incontinence which  Continue  Gemtesa  3. Iron deficiency anemia, unspecified iron deficiency anemia type Lab Results  Component Value Date   HGB 11.4 (L) 06/05/2022   Continue iron  Followed by Dr Truett Perna   4. Essential hypertension Increase diovan to 160 mg BP running up to 170s Will have f/u apt with repeat BMP Consider reducing sodium tabs  5. Permanent atrial fibrillation (HCC) Rate slightly above goal at times, 93 today  On metoprolol Followed by cardiology  Off DOAC due to bleeding  6. Rheumatoid arthritis involving hand with negative rheumatoid factor, unspecified laterality (HCC) ON Arava Followed by rheumatology   7. Hyponatremia On sodium tabs daily and FR  Labs/tests ordered:  * No order type specified * BMP prior to apt   Next appt:  6 week f/u for HTN    Total time :  time greater than 50% of total time spent doing pt counseling and coordination of care

## 2022-07-11 DIAGNOSIS — S82841D Displaced bimalleolar fracture of right lower leg, subsequent encounter for closed fracture with routine healing: Secondary | ICD-10-CM | POA: Diagnosis not present

## 2022-07-12 ENCOUNTER — Encounter: Payer: Self-pay | Admitting: Neurology

## 2022-07-12 DIAGNOSIS — F01A0 Vascular dementia, mild, without behavioral disturbance, psychotic disturbance, mood disturbance, and anxiety: Secondary | ICD-10-CM | POA: Diagnosis not present

## 2022-07-12 DIAGNOSIS — Z9181 History of falling: Secondary | ICD-10-CM | POA: Diagnosis not present

## 2022-07-12 DIAGNOSIS — S82891D Other fracture of right lower leg, subsequent encounter for closed fracture with routine healing: Secondary | ICD-10-CM | POA: Diagnosis not present

## 2022-07-12 DIAGNOSIS — N39498 Other specified urinary incontinence: Secondary | ICD-10-CM | POA: Diagnosis not present

## 2022-07-12 DIAGNOSIS — R41841 Cognitive communication deficit: Secondary | ICD-10-CM | POA: Diagnosis not present

## 2022-07-12 DIAGNOSIS — I4821 Permanent atrial fibrillation: Secondary | ICD-10-CM | POA: Diagnosis not present

## 2022-07-13 DIAGNOSIS — F01A0 Vascular dementia, mild, without behavioral disturbance, psychotic disturbance, mood disturbance, and anxiety: Secondary | ICD-10-CM | POA: Diagnosis not present

## 2022-07-13 DIAGNOSIS — N39498 Other specified urinary incontinence: Secondary | ICD-10-CM | POA: Diagnosis not present

## 2022-07-13 DIAGNOSIS — R41841 Cognitive communication deficit: Secondary | ICD-10-CM | POA: Diagnosis not present

## 2022-07-13 DIAGNOSIS — I4821 Permanent atrial fibrillation: Secondary | ICD-10-CM | POA: Diagnosis not present

## 2022-07-13 DIAGNOSIS — Z9181 History of falling: Secondary | ICD-10-CM | POA: Diagnosis not present

## 2022-07-13 DIAGNOSIS — S82891D Other fracture of right lower leg, subsequent encounter for closed fracture with routine healing: Secondary | ICD-10-CM | POA: Diagnosis not present

## 2022-07-17 DIAGNOSIS — S82891D Other fracture of right lower leg, subsequent encounter for closed fracture with routine healing: Secondary | ICD-10-CM | POA: Diagnosis not present

## 2022-07-17 DIAGNOSIS — N39498 Other specified urinary incontinence: Secondary | ICD-10-CM | POA: Diagnosis not present

## 2022-07-17 DIAGNOSIS — Z9181 History of falling: Secondary | ICD-10-CM | POA: Diagnosis not present

## 2022-07-17 DIAGNOSIS — F01A0 Vascular dementia, mild, without behavioral disturbance, psychotic disturbance, mood disturbance, and anxiety: Secondary | ICD-10-CM | POA: Diagnosis not present

## 2022-07-17 DIAGNOSIS — R41841 Cognitive communication deficit: Secondary | ICD-10-CM | POA: Diagnosis not present

## 2022-07-17 DIAGNOSIS — I4821 Permanent atrial fibrillation: Secondary | ICD-10-CM | POA: Diagnosis not present

## 2022-07-18 ENCOUNTER — Encounter: Payer: Self-pay | Admitting: Internal Medicine

## 2022-07-19 ENCOUNTER — Ambulatory Visit: Payer: Medicare Other | Admitting: Neurology

## 2022-07-19 DIAGNOSIS — F01A0 Vascular dementia, mild, without behavioral disturbance, psychotic disturbance, mood disturbance, and anxiety: Secondary | ICD-10-CM | POA: Diagnosis not present

## 2022-07-19 DIAGNOSIS — N39498 Other specified urinary incontinence: Secondary | ICD-10-CM | POA: Diagnosis not present

## 2022-07-19 DIAGNOSIS — Z9181 History of falling: Secondary | ICD-10-CM | POA: Diagnosis not present

## 2022-07-19 DIAGNOSIS — I4821 Permanent atrial fibrillation: Secondary | ICD-10-CM | POA: Diagnosis not present

## 2022-07-19 DIAGNOSIS — S82891D Other fracture of right lower leg, subsequent encounter for closed fracture with routine healing: Secondary | ICD-10-CM | POA: Diagnosis not present

## 2022-07-19 DIAGNOSIS — R41841 Cognitive communication deficit: Secondary | ICD-10-CM | POA: Diagnosis not present

## 2022-07-20 DIAGNOSIS — N39498 Other specified urinary incontinence: Secondary | ICD-10-CM | POA: Diagnosis not present

## 2022-07-20 DIAGNOSIS — R41841 Cognitive communication deficit: Secondary | ICD-10-CM | POA: Diagnosis not present

## 2022-07-20 DIAGNOSIS — Z9181 History of falling: Secondary | ICD-10-CM | POA: Diagnosis not present

## 2022-07-20 DIAGNOSIS — Z23 Encounter for immunization: Secondary | ICD-10-CM | POA: Diagnosis not present

## 2022-07-20 DIAGNOSIS — F01A0 Vascular dementia, mild, without behavioral disturbance, psychotic disturbance, mood disturbance, and anxiety: Secondary | ICD-10-CM | POA: Diagnosis not present

## 2022-07-20 DIAGNOSIS — I4821 Permanent atrial fibrillation: Secondary | ICD-10-CM | POA: Diagnosis not present

## 2022-07-20 DIAGNOSIS — S82891D Other fracture of right lower leg, subsequent encounter for closed fracture with routine healing: Secondary | ICD-10-CM | POA: Diagnosis not present

## 2022-07-24 DIAGNOSIS — Z9181 History of falling: Secondary | ICD-10-CM | POA: Diagnosis not present

## 2022-07-24 DIAGNOSIS — N39498 Other specified urinary incontinence: Secondary | ICD-10-CM | POA: Diagnosis not present

## 2022-07-24 DIAGNOSIS — F01A0 Vascular dementia, mild, without behavioral disturbance, psychotic disturbance, mood disturbance, and anxiety: Secondary | ICD-10-CM | POA: Diagnosis not present

## 2022-07-24 DIAGNOSIS — R41841 Cognitive communication deficit: Secondary | ICD-10-CM | POA: Diagnosis not present

## 2022-07-24 DIAGNOSIS — I4821 Permanent atrial fibrillation: Secondary | ICD-10-CM | POA: Diagnosis not present

## 2022-07-24 DIAGNOSIS — S82891D Other fracture of right lower leg, subsequent encounter for closed fracture with routine healing: Secondary | ICD-10-CM | POA: Diagnosis not present

## 2022-07-25 DIAGNOSIS — M6389 Disorders of muscle in diseases classified elsewhere, multiple sites: Secondary | ICD-10-CM | POA: Diagnosis not present

## 2022-07-25 DIAGNOSIS — R41841 Cognitive communication deficit: Secondary | ICD-10-CM | POA: Diagnosis not present

## 2022-07-25 DIAGNOSIS — R278 Other lack of coordination: Secondary | ICD-10-CM | POA: Diagnosis not present

## 2022-07-25 DIAGNOSIS — I4821 Permanent atrial fibrillation: Secondary | ICD-10-CM | POA: Diagnosis not present

## 2022-07-25 DIAGNOSIS — R4189 Other symptoms and signs involving cognitive functions and awareness: Secondary | ICD-10-CM | POA: Diagnosis not present

## 2022-07-25 DIAGNOSIS — N39498 Other specified urinary incontinence: Secondary | ICD-10-CM | POA: Diagnosis not present

## 2022-07-25 DIAGNOSIS — F01A0 Vascular dementia, mild, without behavioral disturbance, psychotic disturbance, mood disturbance, and anxiety: Secondary | ICD-10-CM | POA: Diagnosis not present

## 2022-07-25 DIAGNOSIS — S82891D Other fracture of right lower leg, subsequent encounter for closed fracture with routine healing: Secondary | ICD-10-CM | POA: Diagnosis not present

## 2022-07-25 DIAGNOSIS — Z9181 History of falling: Secondary | ICD-10-CM | POA: Diagnosis not present

## 2022-07-26 DIAGNOSIS — F01A0 Vascular dementia, mild, without behavioral disturbance, psychotic disturbance, mood disturbance, and anxiety: Secondary | ICD-10-CM | POA: Diagnosis not present

## 2022-07-26 DIAGNOSIS — R41841 Cognitive communication deficit: Secondary | ICD-10-CM | POA: Diagnosis not present

## 2022-07-26 DIAGNOSIS — N39498 Other specified urinary incontinence: Secondary | ICD-10-CM | POA: Diagnosis not present

## 2022-07-26 DIAGNOSIS — I4821 Permanent atrial fibrillation: Secondary | ICD-10-CM | POA: Diagnosis not present

## 2022-07-26 DIAGNOSIS — S82891D Other fracture of right lower leg, subsequent encounter for closed fracture with routine healing: Secondary | ICD-10-CM | POA: Diagnosis not present

## 2022-07-26 DIAGNOSIS — Z9181 History of falling: Secondary | ICD-10-CM | POA: Diagnosis not present

## 2022-07-27 DIAGNOSIS — I4821 Permanent atrial fibrillation: Secondary | ICD-10-CM | POA: Diagnosis not present

## 2022-07-27 DIAGNOSIS — Z9181 History of falling: Secondary | ICD-10-CM | POA: Diagnosis not present

## 2022-07-27 DIAGNOSIS — N39498 Other specified urinary incontinence: Secondary | ICD-10-CM | POA: Diagnosis not present

## 2022-07-27 DIAGNOSIS — F01A0 Vascular dementia, mild, without behavioral disturbance, psychotic disturbance, mood disturbance, and anxiety: Secondary | ICD-10-CM | POA: Diagnosis not present

## 2022-07-27 DIAGNOSIS — R41841 Cognitive communication deficit: Secondary | ICD-10-CM | POA: Diagnosis not present

## 2022-07-27 DIAGNOSIS — S82891D Other fracture of right lower leg, subsequent encounter for closed fracture with routine healing: Secondary | ICD-10-CM | POA: Diagnosis not present

## 2022-07-31 DIAGNOSIS — R41841 Cognitive communication deficit: Secondary | ICD-10-CM | POA: Diagnosis not present

## 2022-07-31 DIAGNOSIS — N39498 Other specified urinary incontinence: Secondary | ICD-10-CM | POA: Diagnosis not present

## 2022-07-31 DIAGNOSIS — Z9181 History of falling: Secondary | ICD-10-CM | POA: Diagnosis not present

## 2022-07-31 DIAGNOSIS — S82891D Other fracture of right lower leg, subsequent encounter for closed fracture with routine healing: Secondary | ICD-10-CM | POA: Diagnosis not present

## 2022-07-31 DIAGNOSIS — I4821 Permanent atrial fibrillation: Secondary | ICD-10-CM | POA: Diagnosis not present

## 2022-07-31 DIAGNOSIS — F01A0 Vascular dementia, mild, without behavioral disturbance, psychotic disturbance, mood disturbance, and anxiety: Secondary | ICD-10-CM | POA: Diagnosis not present

## 2022-08-02 DIAGNOSIS — R41841 Cognitive communication deficit: Secondary | ICD-10-CM | POA: Diagnosis not present

## 2022-08-02 DIAGNOSIS — N39498 Other specified urinary incontinence: Secondary | ICD-10-CM | POA: Diagnosis not present

## 2022-08-02 DIAGNOSIS — S82891D Other fracture of right lower leg, subsequent encounter for closed fracture with routine healing: Secondary | ICD-10-CM | POA: Diagnosis not present

## 2022-08-02 DIAGNOSIS — Z9181 History of falling: Secondary | ICD-10-CM | POA: Diagnosis not present

## 2022-08-02 DIAGNOSIS — I4821 Permanent atrial fibrillation: Secondary | ICD-10-CM | POA: Diagnosis not present

## 2022-08-02 DIAGNOSIS — F01A0 Vascular dementia, mild, without behavioral disturbance, psychotic disturbance, mood disturbance, and anxiety: Secondary | ICD-10-CM | POA: Diagnosis not present

## 2022-08-06 ENCOUNTER — Emergency Department (HOSPITAL_BASED_OUTPATIENT_CLINIC_OR_DEPARTMENT_OTHER)
Admission: EM | Admit: 2022-08-06 | Discharge: 2022-08-06 | Disposition: A | Discharge status: Home / Self Care | Payer: Medicare Other | Attending: Emergency Medicine | Admitting: Emergency Medicine

## 2022-08-06 ENCOUNTER — Emergency Department (HOSPITAL_BASED_OUTPATIENT_CLINIC_OR_DEPARTMENT_OTHER): Payer: Medicare Other | Admitting: Radiology

## 2022-08-06 ENCOUNTER — Encounter: Payer: Self-pay | Admitting: Adult Health

## 2022-08-06 ENCOUNTER — Other Ambulatory Visit: Payer: Self-pay

## 2022-08-06 ENCOUNTER — Emergency Department (HOSPITAL_BASED_OUTPATIENT_CLINIC_OR_DEPARTMENT_OTHER): Payer: Medicare Other

## 2022-08-06 ENCOUNTER — Encounter (HOSPITAL_BASED_OUTPATIENT_CLINIC_OR_DEPARTMENT_OTHER): Payer: Self-pay | Admitting: Emergency Medicine

## 2022-08-06 ENCOUNTER — Non-Acute Institutional Stay: Payer: Medicare Other | Admitting: Adult Health

## 2022-08-06 DIAGNOSIS — R41841 Cognitive communication deficit: Secondary | ICD-10-CM | POA: Diagnosis not present

## 2022-08-06 DIAGNOSIS — Z9181 History of falling: Secondary | ICD-10-CM | POA: Diagnosis not present

## 2022-08-06 DIAGNOSIS — R0602 Shortness of breath: Secondary | ICD-10-CM

## 2022-08-06 DIAGNOSIS — R609 Edema, unspecified: Secondary | ICD-10-CM | POA: Diagnosis not present

## 2022-08-06 DIAGNOSIS — R0609 Other forms of dyspnea: Secondary | ICD-10-CM | POA: Diagnosis not present

## 2022-08-06 DIAGNOSIS — R079 Chest pain, unspecified: Secondary | ICD-10-CM | POA: Diagnosis not present

## 2022-08-06 DIAGNOSIS — S82891D Other fracture of right lower leg, subsequent encounter for closed fracture with routine healing: Secondary | ICD-10-CM | POA: Diagnosis not present

## 2022-08-06 DIAGNOSIS — R600 Localized edema: Secondary | ICD-10-CM | POA: Diagnosis not present

## 2022-08-06 DIAGNOSIS — Z79899 Other long term (current) drug therapy: Secondary | ICD-10-CM | POA: Diagnosis not present

## 2022-08-06 DIAGNOSIS — M7989 Other specified soft tissue disorders: Secondary | ICD-10-CM | POA: Diagnosis not present

## 2022-08-06 DIAGNOSIS — I4821 Permanent atrial fibrillation: Secondary | ICD-10-CM | POA: Diagnosis not present

## 2022-08-06 DIAGNOSIS — I10 Essential (primary) hypertension: Secondary | ICD-10-CM | POA: Insufficient documentation

## 2022-08-06 DIAGNOSIS — M79661 Pain in right lower leg: Secondary | ICD-10-CM | POA: Diagnosis not present

## 2022-08-06 DIAGNOSIS — F01A0 Vascular dementia, mild, without behavioral disturbance, psychotic disturbance, mood disturbance, and anxiety: Secondary | ICD-10-CM | POA: Diagnosis not present

## 2022-08-06 DIAGNOSIS — M79604 Pain in right leg: Secondary | ICD-10-CM | POA: Diagnosis not present

## 2022-08-06 DIAGNOSIS — N39498 Other specified urinary incontinence: Secondary | ICD-10-CM | POA: Diagnosis not present

## 2022-08-06 LAB — BASIC METABOLIC PANEL
Anion gap: 8 (ref 5–15)
BUN: 19 mg/dL (ref 8–23)
CO2: 23 mmol/L (ref 22–32)
Calcium: 9.4 mg/dL (ref 8.9–10.3)
Chloride: 106 mmol/L (ref 98–111)
Creatinine, Ser: 0.76 mg/dL (ref 0.44–1.00)
GFR, Estimated: >60 mL/min (ref >60)
Glucose, Bld: 106 mg/dL — ABNORMAL HIGH (ref 70–99)
Potassium: 4.1 mmol/L (ref 3.5–5.1)
Sodium: 137 mmol/L (ref 135–145)

## 2022-08-06 LAB — CBC
HCT: 35.2 % — ABNORMAL LOW (ref 36.0–46.0)
Hemoglobin: 11.7 g/dL — ABNORMAL LOW (ref 12.0–15.0)
MCH: 33.5 pg (ref 26.0–34.0)
MCHC: 33.2 g/dL (ref 30.0–36.0)
MCV: 100.9 fL — ABNORMAL HIGH (ref 80.0–100.0)
Platelets: 170 10*3/uL (ref 150–400)
RBC: 3.49 MIL/uL — ABNORMAL LOW (ref 3.87–5.11)
RDW: 15.4 % (ref 11.5–15.5)
WBC: 7.6 10*3/uL (ref 4.0–10.5)
nRBC: 0 % (ref 0.0–0.2)

## 2022-08-06 LAB — D-DIMER, QUANTITATIVE: D-Dimer, Quant: 0.39 ug/mL-FEU (ref 0.00–0.50)

## 2022-08-06 LAB — DIGOXIN LEVEL: Digoxin Level: 0.8 ng/mL (ref 0.8–2.0)

## 2022-08-06 NOTE — Progress Notes (Addendum)
Location:  [facility] Room Number: [phone] Place of Service:  ALF [phone]) Provider:  Tamsen Roers, MD  Patient Care Team: Mahlon Gammon, MD as PCP - General (Internal Medicine) Chrystie Nose, MD as PCP - Cardiology (Cardiology) Drema Dallas, DO as Consulting Physician (Neurology)  Extended Emergency Contact Information Primary Emergency Contact: [name],[name] Address: [address]          [place], [place] [address] Darden Amber of [place] Home Phone: [phone] Mobile Phone: [phone] Relation: Daughter Secondary Emergency Contact: Hollie Salk Address: [address]          [place], [place] [address] Darden Amber of [place] Mobile Phone: [phone] Relation: Son  Code Status:  DNR Goals of care: Advanced Directive information    08/06/2022    9:17 AM  Advanced Directives  Does Patient Have a Medical Advance Directive? Yes  Type of Estate agent of [place];Living will;Out of facility DNR (pink MOST or yellow form)  Does patient want to make changes to medical advance directive? No - Patient declined  Copy of Healthcare Power of Attorney in Chart? Yes - validated most recent copy scanned in chart (See row information)  Pre-existing out of facility DNR order (yellow form or pink MOST form) Yellow form placed in chart (order not valid for inpatient use)     Chief Complaint  Patient presents with   Acute Visit    Patient is being seen for acute swelling     HPI:  Pt is a 89 y.o. female seen today for an acute visit for acute swelling and shortness of breath  Resident resides in assisted living due to memory loss.   Nurse reports that she is more short of breath on exertion. She also has right leg swelling that is worse per staff. Her BP has been elevated for the last couple of days 179/95 with HR 112, Sat 91% on RA and 88% when walking per nursing SBAR.   She had a mechanical fall  01/30/22 and fractured right ankle. Xray revealed acute displaced distal fibular fracture and non displaced medical malleolar fracture with possible small displaced posterior malleolar fracture. Wore a boot and was followedd by ortho. She had a prolong recovery but was able to progress from rehab back to AL.   Now ambulatory with a walker but falls frequently. Off DOAC for afib due to falls, head injuries, bleeding etc.    Followed by ortho for right knee pain, falls, knee buckling, genu valgum deformity.  She wears a knee brace at all times due to the knee buckling to prevent falls when she goes to the bathroom.   Past Medical History:  Diagnosis Date   Allergy    seasonal   Anemia    Anxiety    Arthritis    Back   Asymptomatic menopausal state    Atrial fibrillation (HCC)    Cancer (HCC) 2006   Colon.  Basal Cell Skin cancer- right arm   Cataract    removed bilateral   Chronic atrial fibrillation (HCC)    Colon cancer (HCC)    Constipation due to pain medication therapy    after heart surgery   Deficiency of other specified B group vitamins    Diplopia    Disorientation, unspecified    Dysrhythmia    PAF   GERD (gastroesophageal reflux disease)    Heart murmur    Hyperlipidemia    Hypertension    Hypothyroidism  Malignant neoplasm of colon, unspecified (HCC)    Other specified diseases of blood and blood-forming organs    Other specified disorders of bone density and structure, unspecified forearm    Other thrombophilia (HCC)    per matrix   Overactive bladder    per Matrix   Personal history of other malignant neoplasm of large intestine    Presbycusis, bilateral    per matrix   RA (rheumatoid arthritis) (HCC)    Restless leg    Seizures (HCC)    after Heart Surgey   Stroke (HCC)    TIA- found by neurologist after    Valgus deformity, not elsewhere classified, right knee    per matrix   Vascular dementia, mild, without behavioral disturbance, psychotic  disturbance, mood disturbance, and anxiety (HCC)    per matrix   Past Surgical History:  Procedure Laterality Date   ABDOMINAL HYSTERECTOMY  1970   Partial    COLON RESECTION  2006   cancer   COLON SURGERY     COLONOSCOPY     EYE SURGERY Bilateral    Cataract   MAXIMUM ACCESS (MAS)POSTERIOR LUMBAR INTERBODY FUSION (PLIF) 2 LEVEL N/A 04/12/2015   Procedure: Lumbar Three-Five Decompression, Pedicle Screw Fixation, and Posteriolateral Arthrodesis;  Surgeon: Maeola Harman, MD;  Location: [facility];  Service: Neurosurgery;  Laterality: N/A;  L3-4 L4-5 Maximum access posterior lumbar fusion, possible interbodies and resection of synovial cyst at L4-5   MITRAL VALVE REPAIR  01/20/2013   Gore-tex cords to P1, P2, and P3. Magic suture to posterior medial commisure, #30 Physio 1 ring. Done in [place]   TONSILLECTOMY     about 1940   TRICUSPID VALVE SURGERY  01/20/2013   #28 TriAd ring done in [place]    Allergies  Allergen Reactions   Claritin [Loratadine] Other (See Comments)    Listed on MAR Unknown reaction   Codeine Other (See Comments)    "just don't take it well"   Gabapentin Other (See Comments)    Dizziness    Molds & Smuts Other (See Comments)    Dust.  Reaction is not listed on MAR    Pollen Extract Swelling    Grass   Bee Venom Swelling and Rash   Wasp Venom Swelling and Rash    Outpatient Encounter Medications as of 08/06/2022  Medication Sig   acetaminophen (TYLENOL) 500 MG tablet Take 1,000 mg by mouth in the morning and at bedtime.   atorvastatin (LIPITOR) 20 MG tablet TAKE 1 TABLET DAILY   Cholecalciferol (VITAMIN D) 50 MCG (2000 UT) CAPS Take 2,000 Units by mouth in the morning.   cloNIDine (CATAPRES) 0.1 MG tablet Take 1 tablet (0.1 mg total) by mouth daily as needed. If SBP >180 only   digoxin (LANOXIN) 0.125 MG tablet TAKE 1 TABLET DAILY   Iron, Ferrous Sulfate, 325 (65 Fe) MG TABS Take 325 mg by mouth daily.   leflunomide (ARAVA) 20 MG tablet Take 20 mg by  mouth in the morning.   levalbuterol (XOPENEX) 0.63 MG/3ML nebulizer solution Take 3 mLs (0.63 mg total) by nebulization in the morning and at bedtime.   levothyroxine (SYNTHROID) 100 MCG tablet Take 1 tablet (100 mcg total) by mouth daily before breakfast.   melatonin 3 MG TABS tablet Take 1 tablet (3 mg total) by mouth at bedtime as needed.   metoprolol succinate (TOPROL-XL) 50 MG 24 hr tablet TAKE 1 TABLET DAILY   mirtazapine (REMERON) 15 MG tablet TAKE 1 TABLET AT BEDTIME  Multiple Vitamin (MULTIVITAMIN) tablet Take 1 tablet by mouth in the morning.   Multiple Vitamins-Minerals (PRESERVISION AREDS 2) CAPS Take 1 capsule by mouth 2 (two) times daily.   omeprazole (PRILOSEC) 40 MG capsule TAKE 1 CAPSULE DAILY   ondansetron (ZOFRAN) 4 MG tablet Take 4 mg by mouth every 6 (six) hours as needed for nausea or vomiting.   sodium chloride 1 g tablet Take 1 g by mouth daily.   valsartan (DIOVAN) 160 MG tablet Take 1 tablet (160 mg total) by mouth daily.   Vibegron (GEMTESA) 75 MG TABS Take 75 mg by mouth daily.   vitamin B-12 (CYANOCOBALAMIN) 1000 MCG tablet Take 1,000 mcg by mouth in the morning.   NON FORMULARY Fluid restriction 1800cc daily  1st- 900 cc 2nd- 700 cc 3rd- 200 cc   No facility-administered encounter medications on file as of 08/06/2022.    Review of Systems  Constitutional:  Negative for activity change, appetite change, chills, diaphoresis, fatigue, fever and unexpected weight change.  HENT:  Negative for congestion.   Respiratory:  Positive for shortness of breath. Negative for cough and wheezing.   Cardiovascular:  Positive for leg swelling. Negative for chest pain and palpitations.  Gastrointestinal:  Negative for abdominal distention, abdominal pain, constipation and diarrhea.  Genitourinary:  Negative for difficulty urinating and dysuria.  Musculoskeletal:  Positive for arthralgias (right knee, generalized due to RA) and gait problem. Negative for back pain, joint  swelling and myalgias.  Neurological:  Negative for dizziness, tremors, seizures, syncope, facial asymmetry, speech difficulty, weakness, light-headedness, numbness and headaches.  Psychiatric/Behavioral:  Positive for confusion (memory loss). Negative for agitation and behavioral problems.     Immunization History  Administered Date(s) Administered   Fluad Quad(high Dose 65+) 01/05/2019, 12/07/2021   H1N1 04/07/2008   Influenza Whole 12/29/2008, 12/22/2009   Influenza, High Dose Seasonal PF 01/15/2012, 12/23/2014, 01/22/2020   Influenza,inj,Quad PF,6+ Mos 01/22/2014, 12/10/2016, 01/17/2018   Influenza,trivalent, recombinat, inj, PF 01/05/2016   Influenza-Unspecified 04/07/2008, 12/15/2014, 01/13/2021   Moderna Covid-19 Vaccine Bivalent Booster 18yrs & up 01/29/2022   Moderna Sars-Covid-2 Vaccination 04/07/2019, 05/05/2019, 02/09/2020   PFIZER(Purple Top)SARS-COV-2 Vaccination 01/06/2021   PPD Test 07/17/2010, 04/19/2015, 04/25/2015   Pfizer Covid-19 Vaccine Bivalent Booster 58yrs & up 07/19/2022   Pneumococcal Conjugate-13 11/10/2013   Pneumococcal Polysaccharide-23 12/27/2010, 12/05/2016   RSV,unspecified 03/28/2022   Tdap 04/15/2017   Zoster Recombinat (Shingrix) 04/09/2017, 06/18/2017   Pertinent  Health Maintenance Due  Topic Date Due   INFLUENZA VACCINE  10/25/2022   DEXA SCAN  Completed      03/10/2022    7:14 AM 03/22/2022   11:45 AM 05/21/2022    1:06 PM 06/06/2022    4:55 PM 07/09/2022    1:44 PM  Fall Risk  Falls in the past year?  1 1 1  0  Was there an injury with Fall?  1 1 1  0  Fall Risk Category Calculator  3 3 3  0  Fall Risk Category (Retired)  High     (RETIRED) Patient Fall Risk Level High fall risk      Patient at Risk for Falls Due to  History of fall(s) History of fall(s);Impaired balance/gait;Impaired mobility History of fall(s);Impaired balance/gait;Impaired mobility History of fall(s);Impaired balance/gait;Impaired mobility  Fall risk Follow up  Falls  evaluation completed Falls evaluation completed Falls evaluation completed;Education provided;Falls prevention discussed Falls evaluation completed   Functional Status Survey:    Vitals:   08/06/22 0911  BP: (!) 157/90  Pulse: 81  Resp: 18  Temp: (!) 97.3 F (36.3 C)  TempSrc: Temporal  SpO2: 95%  Weight: 120 lb (54.4 kg)  Height: 5\' 2"  (1.575 m)   Body mass index is 21.95 kg/m. Physical Exam Vitals and nursing note reviewed.  Constitutional:      General: She is not in acute distress.    Appearance: She is not diaphoretic.  HENT:     Head: Normocephalic and atraumatic.  Neck:     Vascular: No JVD.  Cardiovascular:     Rate and Rhythm: Normal rate. Rhythm irregular.     Heart sounds: Murmur heard.  Pulmonary:     Breath sounds: Normal breath sounds. No wheezing.     Comments: Slight increased work of breathing at rest, worsening with exertion.  Musculoskeletal:     Right lower leg: Edema (+2 with tenderness to posterior calf. no redness or warmth) present.     Left lower leg: No edema.  Skin:    General: Skin is warm and dry.  Neurological:     General: No focal deficit present.     Mental Status: She is alert. Mental status is at baseline.  Psychiatric:        Mood and Affect: Mood normal.     Labs reviewed: Recent Labs    12/07/21 0548 12/20/21 2238 01/30/22 1809 02/03/22 0000 03/09/22 0711 03/10/22 0820 03/15/22 0000 06/22/22 1619  NA 137   < > 132*   < > 125* 131* 132* 136  K 4.2   < > 4.3   < > 4.2 4.2 4.7 4.7  CL 104   < > 100   < > 91* 98 100 99  CO2 23   < > 22   < > 26 26 23* 22  GLUCOSE 90   < > 116*   < > 99 97  --  100*  BUN 15   < > 22   < > 24* 26* 16 18  CREATININE 0.79   < > 0.88   < > 0.83 0.75 0.7 0.72  CALCIUM 9.1   < > 9.4   < > 8.5* 8.9 8.8 9.5  MG 1.6*  --  1.9  --   --   --   --   --    < > = values in this interval not displayed.   Recent Labs    10/03/21 1055 03/06/22 1306 03/07/22 0100  AST 22 18 22   ALT 18 17 16    ALKPHOS 75 76 66  BILITOT 0.8 0.6 0.7  PROT 7.1 6.6 5.8*  ALBUMIN 4.5 3.8 3.1*   Recent Labs    03/06/22 1306 03/06/22 1930 03/10/22 0820 03/15/22 0000 04/17/22 1128 06/05/22 1432  WBC 9.4   < > 10.1 6.2 7.5 8.9  NEUTROABS 6.9  --   --   --  4.8 5.8  HGB 8.3*   < > 10.1* 9.3* 11.3* 11.4*  HCT 25.4*   < > 30.5* 28* 34.3* 33.9*  MCV 104.1*   < > 102.0*  --  100.9* 100.6*  PLT 193   < > 222 246 186 187   < > = values in this interval not displayed.   Lab Results  Component Value Date   TSH 3.519 04/17/2022   Lab Results  Component Value Date   HGBA1C 5.5 01/12/2013   Lab Results  Component Value Date   CHOL 115 02/02/2021   HDL 48 02/02/2021   LDLCALC 49 02/02/2021   TRIG 91 02/02/2021  Significant Diagnostic Results in last 30 days:  No results found.  Assessment/Plan  1. Right leg swelling Recommend venous doppler to r/o DVT Compression hose Elevation   2. Dyspnea on exertion Worsening per staff Lungs are fairly clear with no signs of resp infection ?PE vs CHF  Recommend ER eval as we are not able to get a doppler done in a timely matter nor CT of the chest.    Family/ staff Communication: Patric Dykes daughter  Labs/tests ordered:  venous doppler, may need CT of the chest   Total time :  time greater than 50% of total time spent doing pt counseling and coordination of care

## 2022-08-06 NOTE — ED Provider Notes (Signed)
[place] EMERGENCY DEPARTMENT AT [facility] Provider Note   CSN: [id number] Arrival date & time: 08/06/22  1127     History  Chief Complaint  Patient presents with   Leg Pain   Leg Swelling    Kathy Velez is a 89 y.o. female.  The history is provided by the patient.  Patient presents from nursing facility for right leg swelling and pain.  Patient reports she has had the swelling since an ankle fracture in November.  Nursing staff at facility was concerned that it was getting worse.  She is also had increasing shortness of breath, dyspnea on exertion.  Patient reports that it has been chronic since her ankle fracture. She denies any chest pain.  She denies any recent falls     Home Medications Prior to Admission medications   Medication Sig Start Date End Date Taking? Authorizing Provider  valsartan (DIOVAN) 80 MG tablet Take 80 mg by mouth daily. 08/04/22  Yes [provider]  acetaminophen (TYLENOL) 500 MG tablet Take 1,000 mg by mouth in the morning and at bedtime.    [provider]  atorvastatin (LIPITOR) 20 MG tablet TAKE 1 TABLET DAILY 06/14/21   Mahlon Gammon, MD  Cholecalciferol (VITAMIN D) 50 MCG (2000 UT) CAPS Take 2,000 Units by mouth in the morning.    [provider]  cloNIDine (CATAPRES) 0.1 MG tablet Take 1 tablet (0.1 mg total) by mouth daily as needed. If SBP >180 only 02/08/22   Fletcher Anon, NP  digoxin (LANOXIN) 0.125 MG tablet TAKE 1 TABLET DAILY 06/14/21   Mahlon Gammon, MD  Iron, Ferrous Sulfate, 325 (65 Fe) MG TABS Take 325 mg by mouth daily. 03/03/22   Fletcher Anon, NP  leflunomide (ARAVA) 20 MG tablet Take 20 mg by mouth in the morning. 10/05/19   [provider]  levalbuterol Pauline Aus) 0.63 MG/3ML nebulizer solution Take 3 mLs (0.63 mg total) by nebulization in the morning and at bedtime. 02/08/22   Fletcher Anon, NP  levothyroxine (SYNTHROID) 100 MCG tablet Take 1 tablet (100 mcg total) by mouth  daily before breakfast. 01/04/22   Ladene Artist, MD  melatonin 3 MG TABS tablet Take 1 tablet (3 mg total) by mouth at bedtime as needed. 03/10/22   Lanae Boast, MD  metoprolol succinate (TOPROL-XL) 50 MG 24 hr tablet TAKE 1 TABLET DAILY 06/14/21   Mahlon Gammon, MD  mirtazapine (REMERON) 15 MG tablet TAKE 1 TABLET AT BEDTIME 02/20/21   Mahlon Gammon, MD  Multiple Vitamin (MULTIVITAMIN) tablet Take 1 tablet by mouth in the morning.    [provider]  Multiple Vitamins-Minerals (PRESERVISION AREDS 2) CAPS Take 1 capsule by mouth 2 (two) times daily.    [provider]  NON FORMULARY Fluid restriction 1800cc daily  1st- 900 cc 2nd- 700 cc 3rd- 200 cc    [provider]  omeprazole (PRILOSEC) 40 MG capsule TAKE 1 CAPSULE DAILY 10/17/21   Mahlon Gammon, MD  ondansetron (ZOFRAN) 4 MG tablet Take 4 mg by mouth every 6 (six) hours as needed for nausea or vomiting.    [provider]  sodium chloride 1 g tablet Take 1 g by mouth daily.    [provider]  valsartan (DIOVAN) 160 MG tablet Take 1 tablet (160 mg total) by mouth daily. 07/09/22   Fletcher Anon, NP  Vibegron (GEMTESA) 75 MG TABS Take 75 mg by mouth daily.    [provider]  vitamin B-12 (  CYANOCOBALAMIN) 1000 MCG tablet Take 1,000 mcg by mouth in the morning.    [provider]      Allergies    Claritin [loratadine], Codeine, Gabapentin, Molds & smuts, Pollen extract, Bee venom, and Wasp venom    Review of Systems   Review of Systems  Constitutional:  Negative for fever.  Respiratory:  Positive for shortness of breath.   Cardiovascular:  Positive for leg swelling. Negative for chest pain.    Physical Exam Updated Vital Signs BP (!) 163/98   Pulse 73   Temp 98.2 F (36.8 C) (Oral)   Resp 16   SpO2 93%  Physical Exam CONSTITUTIONAL: elderly, no distress HEAD: Normocephalic/atraumatic ENMT: Mucous membranes moist NECK: supple no meningeal signs CV:  S1/S2 noted, no murmurs/rubs/gallops noted LUNGS: Lungs are clear to auscultation bilaterally, mild tachypnea ABDOMEN: soft GU:no cva tenderness NEURO: Pt is awake/alert/appropriate, moves all extremitiesx4.  No facial droop.   EXTREMITIES: pulses normal/equal, full ROM Distal pulses equal and intact.  Right lower extremity has mild pitting edema.  No erythema. Mild tenderness to right lower extremity.  She has full range of motion of right hip and right knee SKIN: warm, color normal  ED Results / Procedures / Treatments   Labs (all labs ordered are listed, but only abnormal results are displayed) Labs Reviewed  CBC - Abnormal; Notable for the following components:      Result Value   RBC 3.49 (*)    Hemoglobin 11.7 (*)    HCT 35.2 (*)    MCV 100.9 (*)    All other components within normal limits  BASIC METABOLIC PANEL - Abnormal; Notable for the following components:   Glucose, Bld 106 (*)    All other components within normal limits  DIGOXIN LEVEL  D-DIMER, QUANTITATIVE    EKG EKG Interpretation  Date/Time:  Monday Aug 06 2022 13:32:47 EDT Ventricular Rate:  72 PR Interval:    QRS Duration: 86 QT Interval:  348 QTC Calculation: 381 R Axis:   -14 Text Interpretation: Atrial fibrillation Ventricular premature complex RSR' in V1 or V2, probably normal variant Borderline repolarization abnormality No significant change since last tracing Confirmed by Zadie Rhine ([id number]) on 08/06/2022 1:41:40 PM  Radiology DG Tibia/Fibula Right  Result Date: 08/06/2022 CLINICAL DATA:  Right lower leg pain and swelling EXAM: RIGHT TIBIA AND FIBULA - 2 VIEW COMPARISON:  01/30/2022 FINDINGS: No acute fracture or dislocation. No aggressive osseous lesion. Normal alignment. Generalized osteopenia. Healed right lateral malleolar fracture. Tricompartmental osteoarthritis of the right knee most severe in the lateral femorotibial compartment. Small plantar calcaneal spur. Mild osteoarthritis of the  talonavicular joint. Generalized soft tissue edema throughout the subcutaneous fat of the right lower leg. Peripheral vascular atherosclerotic disease. No radiopaque foreign body or soft tissue emphysema. IMPRESSION: 1. No acute osseous injury of the right tibia and fibula. 2. Generalized soft tissue edema throughout the subcutaneous fat of the right lower leg. Electronically Signed   By: Elige Ko M.D.   On: 08/06/2022 13:15   US Venous Img Lower Unilateral Right  Result Date: 08/06/2022 CLINICAL DATA:  Pain and swelling EXAM: Right LOWER EXTREMITY VENOUS DOPPLER ULTRASOUND TECHNIQUE: Gray-scale sonography with compression, as well as color and duplex ultrasound, were performed to evaluate the deep venous system(s) from the level of the common femoral vein through the popliteal and proximal calf veins. COMPARISON:  None Available. FINDINGS: VENOUS Normal compressibility of the common femoral, superficial femoral, and popliteal veins, as well as the visualized calf  veins. Visualized portions of profunda femoral vein and great saphenous vein unremarkable. No filling defects to suggest DVT on grayscale or color Doppler imaging. Doppler waveforms show normal direction of venous flow, normal respiratory plasticity and response to augmentation. Limited views of the contralateral common femoral vein are unremarkable. OTHER There is edema in subcutaneous plane in right lower leg without any loculated fluid collections. Limitations: none IMPRESSION: There is no evidence of deep venous thrombosis in right lower extremity. Electronically Signed   By: Ernie Avena M.D.   On: 08/06/2022 13:01   DG Chest Portable 1 View  Result Date: 08/06/2022 CLINICAL DATA:  Right leg swelling and pain for a day. Recent ankle fracture EXAM: PORTABLE CHEST 1 VIEW COMPARISON:  Chest x-ray 03/07/2021 and older FINDINGS: Status post median sternotomy. Prosthetic cardiac valve. Borderline size heart. Apical pleural thickening with  stable interstitial changes. No consolidation, pneumothorax or effusion. No edema. Osteopenia and degenerative changes. IMPRESSION: Postop chest. Borderline size heart with chronic appearing interstitial and pleural changes. Electronically Signed   By: Karen Kays M.D.   On: 08/06/2022 12:19    Procedures Procedures    Medications Ordered in ED Medications - No data to display  ED Course/ Medical Decision Making/ A&P Clinical Course as of 08/06/22 1445  Mon Aug 06, 2022  1208 Patient presents from local nursing facility for leg swelling and shortness of breath.  Patient is in no distress.  Will obtain DVT study of the right leg.  She denies known history of VTE and is not apparently on anticoagulation.  Denies any recent trauma.  Will also obtain labs and chest x-ray due to recent shortness of breath. [DW]  1445 Extensive workup was unremarkable.  No signs of DVT.  D-dimer negative She is in no distress. Patient was taken off anticoagulation due to fall risk. She does have a history of A-fib, is on digoxin. Patient is at baseline.  Discussed this with daughter.  Patient to be discharged back to facility [DW]    Clinical Course User Index [DW] Zadie Rhine, MD                             Medical Decision Making Amount and/or Complexity of Data Reviewed Labs: ordered. Radiology: ordered. ECG/medicine tests: ordered.   This patient presents to the ED for concern of leg swelling, this involves an extensive number of treatment options, and is a complaint that carries with it a high risk of complications and morbidity.  The differential diagnosis includes but is not limited to DVT, cellulitis, fracture, CHF  Comorbidities that complicate the patient evaluation: Patient's presentation is complicated by their history of hypertension  Social Determinants of Health: Patient's  is a poor historian   increases the complexity of managing their presentation  Additional history  obtained: Records reviewed Nursing Home Documents   Imaging Studies ordered: I ordered imaging studies including X-ray chest and right leg and Ultrasound DVT study   I independently visualized and interpreted imaging which showed no acute findings I agree with the radiologist interpretation   Reevaluation: After the interventions noted above, I reevaluated the patient and found that they have :stayed the same  Complexity of problems addressed: Patient's presentation is most consistent with  acute presentation with potential threat to life or bodily function  Disposition: After consideration of the diagnostic results and the patient's response to treatment,  I feel that the patent would benefit from discharge   .  Final Clinical Impression(s) / ED Diagnoses Final diagnoses:  Shortness of breath  Peripheral edema    Rx / DC Orders ED Discharge Orders     None         Zadie Rhine, MD 08/06/22 1446

## 2022-08-06 NOTE — ED Triage Notes (Signed)
Pt arrives to ED with [facility] from [name] with c/o right leg swelling and pain x1 day. Pt with recent hx of bimalleolar fracture of right ankle on 11/7.

## 2022-08-07 ENCOUNTER — Non-Acute Institutional Stay: Payer: Medicare Other | Admitting: Orthopedic Surgery

## 2022-08-07 ENCOUNTER — Encounter: Payer: Self-pay | Admitting: Orthopedic Surgery

## 2022-08-07 DIAGNOSIS — H6123 Impacted cerumen, bilateral: Secondary | ICD-10-CM

## 2022-08-07 DIAGNOSIS — R0609 Other forms of dyspnea: Secondary | ICD-10-CM

## 2022-08-07 DIAGNOSIS — M7989 Other specified soft tissue disorders: Secondary | ICD-10-CM | POA: Diagnosis not present

## 2022-08-07 DIAGNOSIS — I10 Essential (primary) hypertension: Secondary | ICD-10-CM

## 2022-08-07 NOTE — Progress Notes (Unsigned)
Location:  [facility] Room Number: 524-A Place of Service:  ALF [phone]) Provider: Hazle Nordmann, NP  Code Status: DNR Goals of Care:     08/07/2022   10:21 AM  Advanced Directives  Does Patient Have a Medical Advance Directive? Yes  Type of Estate agent of [place];Living will;Out of facility DNR (pink MOST or yellow form)  Does patient want to make changes to medical advance directive? No - Patient declined  Copy of Healthcare Power of Attorney in Chart? Yes - validated most recent copy scanned in chart (See row information)     Chief Complaint  Patient presents with   Emergency Department     ED follow up for leg swelling.     HPI: Patient is a 89 y.o. female seen today s/p ED encounter 08/06/2022.   She currently resides on the assisted living unit at [facility]. PMH: afib, aortic atherosclerosis, HTN, carotid stenosis, tricuspid valve surgery 2014, TIA, neurocognitive disorder due to vascular disease, interstitial lung disease, colon cancer, diverticulosis, GERD, GI Bleed, hypothyroidism, RA, osteopenia, OAB, anemia and depression.   05/13 she was seen for increased right leg swelling and shortness of breath. She was sent to ED for further evaluation. CXR negative for consolidation, pneumothorax or effusion. Doppler right leg negative for DVT. D-dimer negative. Right tib/fib xray negative for acute fracture, confirmed generalized soft tissue edema. She was discharged back to [name].   Today,right leg swelling has improved. Nursing applied new right knee brace. ED advised wearing compression hose during day. She is 92% on RA. She continues to have elevated blood pressures, see below. Diovan was increased to 160 mg last month. She is also on metoprolol and clonidine prn. She denies chest pain and sob today. Afebrile. Vitals stable.    Daughter reports changes in hearing. Will check for cerumen buildup today.   Recent blood  pressures:  05/14- 148/92  05/13- 167/68, 143/74  05/12- 157/90, 183/98 Past Medical History:  Diagnosis Date   Allergy    seasonal   Anemia    Anxiety    Arthritis    Back   Asymptomatic menopausal state    Atrial fibrillation (HCC)    Cancer (HCC) 2006   Colon.  Basal Cell Skin cancer- right arm   Cataract    removed bilateral   Chronic atrial fibrillation (HCC)    Colon cancer (HCC)    Constipation due to pain medication therapy    after heart surgery   Deficiency of other specified B group vitamins    Diplopia    Disorientation, unspecified    Dysrhythmia    PAF   GERD (gastroesophageal reflux disease)    Heart murmur    Hyperlipidemia    Hypertension    Hypothyroidism    Malignant neoplasm of colon, unspecified (HCC)    Other specified diseases of blood and blood-forming organs    Other specified disorders of bone density and structure, unspecified forearm    Other thrombophilia (HCC)    per matrix   Overactive bladder    per Matrix   Personal history of other malignant neoplasm of large intestine    Presbycusis, bilateral    per matrix   RA (rheumatoid arthritis) (HCC)    Restless leg    Seizures (HCC)    after Heart Surgey   Stroke (HCC)    TIA- found by neurologist after    Valgus deformity, not elsewhere classified, right knee    per matrix  Vascular dementia, mild, without behavioral disturbance, psychotic disturbance, mood disturbance, and anxiety (HCC)    per matrix    Past Surgical History:  Procedure Laterality Date   ABDOMINAL HYSTERECTOMY  1970   Partial    COLON RESECTION  2006   cancer   COLON SURGERY     COLONOSCOPY     EYE SURGERY Bilateral    Cataract   MAXIMUM ACCESS (MAS)POSTERIOR LUMBAR INTERBODY FUSION (PLIF) 2 LEVEL N/A 04/12/2015   Procedure: Lumbar Three-Five Decompression, Pedicle Screw Fixation, and Posteriolateral Arthrodesis;  Surgeon: Maeola Harman, MD;  Location: [facility];  Service: Neurosurgery;  Laterality: N/A;   L3-4 L4-5 Maximum access posterior lumbar fusion, possible interbodies and resection of synovial cyst at L4-5   MITRAL VALVE REPAIR  01/20/2013   Gore-tex cords to P1, P2, and P3. Magic suture to posterior medial commisure, #30 Physio 1 ring. Done in [place]   TONSILLECTOMY     about 1940   TRICUSPID VALVE SURGERY  01/20/2013   #28 TriAd ring done in [place]    Allergies  Allergen Reactions   Claritin [Loratadine] Other (See Comments)    Listed on MAR Unknown reaction   Codeine Other (See Comments)    "just don't take it well"   Gabapentin Other (See Comments)    Dizziness    Molds & Smuts Other (See Comments)    Dust.  Reaction is not listed on MAR    Pollen Extract Swelling    Grass   Bee Venom Swelling and Rash   Wasp Venom Swelling and Rash    Outpatient Encounter Medications as of 08/07/2022  Medication Sig   acetaminophen (TYLENOL) 500 MG tablet Take 1,000 mg by mouth in the morning and at bedtime.   atorvastatin (LIPITOR) 20 MG tablet TAKE 1 TABLET DAILY   Cholecalciferol (VITAMIN D) 50 MCG (2000 UT) CAPS Take 2,000 Units by mouth in the morning.   cloNIDine (CATAPRES) 0.1 MG tablet Take 1 tablet (0.1 mg total) by mouth daily as needed. If SBP >180 only   digoxin (LANOXIN) 0.125 MG tablet TAKE 1 TABLET DAILY   Iron, Ferrous Sulfate, 325 (65 Fe) MG TABS Take 325 mg by mouth daily.   leflunomide (ARAVA) 20 MG tablet Take 20 mg by mouth in the morning.   levalbuterol (XOPENEX) 0.63 MG/3ML nebulizer solution Take 3 mLs (0.63 mg total) by nebulization in the morning and at bedtime.   levothyroxine (SYNTHROID) 100 MCG tablet Take 1 tablet (100 mcg total) by mouth daily before breakfast.   melatonin 3 MG TABS tablet Take 1 tablet (3 mg total) by mouth at bedtime as needed.   metoprolol succinate (TOPROL-XL) 50 MG 24 hr tablet TAKE 1 TABLET DAILY   mirtazapine (REMERON) 15 MG tablet TAKE 1 TABLET AT BEDTIME   Multiple Vitamin (MULTIVITAMIN ADULT PO) Take 1 tablet by mouth every  morning.   Multiple Vitamin (MULTIVITAMIN) tablet Take 1 tablet by mouth in the morning.   Multiple Vitamins-Minerals (PRESERVISION AREDS 2) CAPS Take 1 capsule by mouth in the morning and at bedtime.   NON FORMULARY Fluid restriction 1800cc daily  1st- 900 cc 2nd- 700 cc 3rd- 200 cc   omeprazole (PRILOSEC) 40 MG capsule TAKE 1 CAPSULE DAILY   ondansetron (ZOFRAN) 4 MG tablet Take 4 mg by mouth every 6 (six) hours as needed for nausea or vomiting.   sodium chloride 1 g tablet Take 1 g by mouth daily.   valsartan (DIOVAN) 80 MG tablet Take 80 mg by  mouth daily.   Vibegron (GEMTESA) 75 MG TABS Take 75 mg by mouth daily.   vitamin B-12 (CYANOCOBALAMIN) 1000 MCG tablet Take 1,000 mcg by mouth in the morning.   [DISCONTINUED] valsartan (DIOVAN) 160 MG tablet Take 1 tablet (160 mg total) by mouth daily.   No facility-administered encounter medications on file as of 08/07/2022.    Review of Systems:  Review of Systems  Constitutional:  Negative for activity change and appetite change.  HENT:  Negative for sore throat and trouble swallowing.   Eyes:  Negative for visual disturbance.  Respiratory:  Positive for shortness of breath. Negative for cough and wheezing.   Cardiovascular:  Positive for leg swelling. Negative for chest pain.  Gastrointestinal:  Negative for abdominal pain.  Genitourinary:  Negative for dysuria.  Musculoskeletal:  Positive for gait problem.  Skin:  Negative for wound.  Neurological:  Positive for weakness. Negative for dizziness and light-headedness.  Psychiatric/Behavioral:  Negative for confusion and dysphoric mood. The patient is not nervous/anxious.     Health Maintenance  Topic Date Due   COVID-19 Vaccine (7 - 2023-24 season) 09/13/2022   INFLUENZA VACCINE  10/25/2022   DTaP/Tdap/Td (2 - Td or Tdap) 04/16/2027   Pneumonia Vaccine 51+ Years old  Completed   DEXA SCAN  Completed   Zoster Vaccines- Shingrix  Completed   HPV VACCINES  Aged Out    Physical  Exam: Vitals:   08/07/22 1010  BP: (!) 148/92  Pulse: 92  Resp: 18  Temp: (!) 97.4 F (36.3 C)  SpO2: 94%  Weight: 120 lb (54.4 kg)  Height: 5\' 2"  (1.575 m)   Body mass index is 21.95 kg/m. Physical Exam Vitals reviewed.  Constitutional:      General: She is not in acute distress. HENT:     Head: Normocephalic.     Right Ear: There is no impacted cerumen.     Left Ear: There is no impacted cerumen.     Ears:     Comments: Able to view TM, mild amount of cerumen in ear canal    Nose: Nose normal.     Mouth/Throat:     Mouth: Mucous membranes are moist.  Eyes:     General:        Right eye: No discharge.        Left eye: No discharge.  Cardiovascular:     Rate and Rhythm: Normal rate. Rhythm irregular.     Pulses: Normal pulses.     Heart sounds: Murmur heard.  Pulmonary:     Effort: Pulmonary effort is normal. No respiratory distress.     Breath sounds: Normal breath sounds. No wheezing or rales.  Abdominal:     General: Bowel sounds are normal.     Palpations: Abdomen is soft.  Musculoskeletal:     Cervical back: Neck supple.     Right lower leg: Edema present.     Left lower leg: No edema.     Comments: Non pitting, compression hose on  Skin:    General: Skin is warm.     Capillary Refill: Capillary refill takes less than 2 seconds.  Neurological:     General: No focal deficit present.     Mental Status: She is alert. Mental status is at baseline.  Psychiatric:        Mood and Affect: Mood normal.     Labs reviewed: Basic Metabolic Panel: Recent Labs    12/07/21 0548 12/20/21 2238 01/06/22 0000 01/30/22 1809 02/03/22  0000 03/10/22 0820 03/15/22 0000 04/17/22 1128 06/22/22 1619 08/06/22 1327  NA 137   < >  --  132*   < > 131* 132*  --  136 137  K 4.2   < >  --  4.3   < > 4.2 4.7  --  4.7 4.1  CL 104   < >  --  100   < > 98 100  --  99 106  CO2 23   < >  --  22   < > 26 23*  --  22 23  GLUCOSE 90   < >  --  116*   < > 97  --   --  100* 106*   BUN 15   < >  --  22   < > 26* 16  --  18 19  CREATININE 0.79   < >  --  0.88   < > 0.75 0.7  --  0.72 0.76  CALCIUM 9.1   < >  --  9.4   < > 8.9 8.8  --  9.5 9.4  MG 1.6*  --   --  1.9  --   --   --   --   --   --   TSH  --    < > 22.70*  --   --   --  6.07* 3.519  --   --    < > = values in this interval not displayed.   Liver Function Tests: Recent Labs    10/03/21 1055 03/06/22 1306 03/07/22 0100  AST 22 18 22   ALT 18 17 16   ALKPHOS 75 76 66  BILITOT 0.8 0.6 0.7  PROT 7.1 6.6 5.8*  ALBUMIN 4.5 3.8 3.1*   No results for input(s): "LIPASE", "AMYLASE" in the last 8760 hours. No results for input(s): "AMMONIA" in the last 8760 hours. CBC: Recent Labs    03/06/22 1306 03/06/22 1930 04/17/22 1128 06/05/22 1432 08/06/22 1327  WBC 9.4   < > 7.5 8.9 7.6  NEUTROABS 6.9  --  4.8 5.8  --   HGB 8.3*   < > 11.3* 11.4* 11.7*  HCT 25.4*   < > 34.3* 33.9* 35.2*  MCV 104.1*   < > 100.9* 100.6* 100.9*  PLT 193   < > 186 187 170   < > = values in this interval not displayed.   Lipid Panel: No results for input(s): "CHOL", "HDL", "LDLCALC", "TRIG", "CHOLHDL", "LDLDIRECT" in the last 8760 hours. Lab Results  Component Value Date   HGBA1C 5.5 01/12/2013    Procedures since last visit: DG Tibia/Fibula Right  Result Date: 08/06/2022 CLINICAL DATA:  Right lower leg pain and swelling EXAM: RIGHT TIBIA AND FIBULA - 2 VIEW COMPARISON:  01/30/2022 FINDINGS: No acute fracture or dislocation. No aggressive osseous lesion. Normal alignment. Generalized osteopenia. Healed right lateral malleolar fracture. Tricompartmental osteoarthritis of the right knee most severe in the lateral femorotibial compartment. Small plantar calcaneal spur. Mild osteoarthritis of the talonavicular joint. Generalized soft tissue edema throughout the subcutaneous fat of the right lower leg. Peripheral vascular atherosclerotic disease. No radiopaque foreign body or soft tissue emphysema. IMPRESSION: 1. No acute osseous  injury of the right tibia and fibula. 2. Generalized soft tissue edema throughout the subcutaneous fat of the right lower leg. Electronically Signed   By: Elige Ko M.D.   On: 08/06/2022 13:15   US Venous Img Lower Unilateral Right  Result Date: 08/06/2022 CLINICAL DATA:  Pain and swelling EXAM: Right LOWER EXTREMITY VENOUS DOPPLER ULTRASOUND TECHNIQUE: Gray-scale sonography with compression, as well as color and duplex ultrasound, were performed to evaluate the deep venous system(s) from the level of the common femoral vein through the popliteal and proximal calf veins. COMPARISON:  None Available. FINDINGS: VENOUS Normal compressibility of the common femoral, superficial femoral, and popliteal veins, as well as the visualized calf veins. Visualized portions of profunda femoral vein and great saphenous vein unremarkable. No filling defects to suggest DVT on grayscale or color Doppler imaging. Doppler waveforms show normal direction of venous flow, normal respiratory plasticity and response to augmentation. Limited views of the contralateral common femoral vein are unremarkable. OTHER There is edema in subcutaneous plane in right lower leg without any loculated fluid collections. Limitations: none IMPRESSION: There is no evidence of deep venous thrombosis in right lower extremity. Electronically Signed   By: Ernie Avena M.D.   On: 08/06/2022 13:01   DG Chest Portable 1 View  Result Date: 08/06/2022 CLINICAL DATA:  Right leg swelling and pain for a day. Recent ankle fracture EXAM: PORTABLE CHEST 1 VIEW COMPARISON:  Chest x-ray 03/07/2021 and older FINDINGS: Status post median sternotomy. Prosthetic cardiac valve. Borderline size heart. Apical pleural thickening with stable interstitial changes. No consolidation, pneumothorax or effusion. No edema. Osteopenia and degenerative changes. IMPRESSION: Postop chest. Borderline size heart with chronic appearing interstitial and pleural changes.  Electronically Signed   By: Karen Kays M.D.   On: 08/06/2022 12:19    Assessment/Plan 1. Right leg swelling - 05/13 ED visit - Doppler, D-dimer, right tib/fib xray unremarkable - swelling improved today - cont compression hose during day - cont new right knee brace  2. Dyspnea on exertion - O2 sat 92% RA - 05/13 CXR unremarkable> borderline size heart with chronic appearing interstitial and pleural changes noted - cont Xopenex nebs BID  3. Essential hypertension - uncontrolled, goal < 150/90 - Diovan increased to 160 mg 07/09/2022 - cont metoprolol and clonidine prn - manual blood pressures x 5 days - consider reducing sodium tablet to 500 mg daily   4. Cerumen debris on tympanic membrane of both ears - start Debrox- 5 gtts to both ears ohs x 3 days   Labs/tests ordered:  manual blood pressures x 5 days Next appt:  08/27/2022

## 2022-08-08 DIAGNOSIS — I4821 Permanent atrial fibrillation: Secondary | ICD-10-CM | POA: Diagnosis not present

## 2022-08-08 DIAGNOSIS — S82891D Other fracture of right lower leg, subsequent encounter for closed fracture with routine healing: Secondary | ICD-10-CM | POA: Diagnosis not present

## 2022-08-08 DIAGNOSIS — R41841 Cognitive communication deficit: Secondary | ICD-10-CM | POA: Diagnosis not present

## 2022-08-08 DIAGNOSIS — N39498 Other specified urinary incontinence: Secondary | ICD-10-CM | POA: Diagnosis not present

## 2022-08-08 DIAGNOSIS — Z9181 History of falling: Secondary | ICD-10-CM | POA: Diagnosis not present

## 2022-08-08 DIAGNOSIS — F01A0 Vascular dementia, mild, without behavioral disturbance, psychotic disturbance, mood disturbance, and anxiety: Secondary | ICD-10-CM | POA: Diagnosis not present

## 2022-08-09 DIAGNOSIS — N39498 Other specified urinary incontinence: Secondary | ICD-10-CM | POA: Diagnosis not present

## 2022-08-09 DIAGNOSIS — I4821 Permanent atrial fibrillation: Secondary | ICD-10-CM | POA: Diagnosis not present

## 2022-08-09 DIAGNOSIS — R41841 Cognitive communication deficit: Secondary | ICD-10-CM | POA: Diagnosis not present

## 2022-08-09 DIAGNOSIS — S82891D Other fracture of right lower leg, subsequent encounter for closed fracture with routine healing: Secondary | ICD-10-CM | POA: Diagnosis not present

## 2022-08-09 DIAGNOSIS — F01A0 Vascular dementia, mild, without behavioral disturbance, psychotic disturbance, mood disturbance, and anxiety: Secondary | ICD-10-CM | POA: Diagnosis not present

## 2022-08-09 DIAGNOSIS — Z9181 History of falling: Secondary | ICD-10-CM | POA: Diagnosis not present

## 2022-08-12 DIAGNOSIS — R41841 Cognitive communication deficit: Secondary | ICD-10-CM | POA: Diagnosis not present

## 2022-08-12 DIAGNOSIS — N39498 Other specified urinary incontinence: Secondary | ICD-10-CM | POA: Diagnosis not present

## 2022-08-12 DIAGNOSIS — I4821 Permanent atrial fibrillation: Secondary | ICD-10-CM | POA: Diagnosis not present

## 2022-08-12 DIAGNOSIS — F01A0 Vascular dementia, mild, without behavioral disturbance, psychotic disturbance, mood disturbance, and anxiety: Secondary | ICD-10-CM | POA: Diagnosis not present

## 2022-08-12 DIAGNOSIS — S82891D Other fracture of right lower leg, subsequent encounter for closed fracture with routine healing: Secondary | ICD-10-CM | POA: Diagnosis not present

## 2022-08-12 DIAGNOSIS — Z9181 History of falling: Secondary | ICD-10-CM | POA: Diagnosis not present

## 2022-08-14 ENCOUNTER — Non-Acute Institutional Stay: Payer: Medicare Other | Admitting: Orthopedic Surgery

## 2022-08-14 ENCOUNTER — Encounter: Payer: Self-pay | Admitting: Orthopedic Surgery

## 2022-08-14 DIAGNOSIS — R42 Dizziness and giddiness: Secondary | ICD-10-CM | POA: Diagnosis not present

## 2022-08-14 DIAGNOSIS — I10 Essential (primary) hypertension: Secondary | ICD-10-CM | POA: Diagnosis not present

## 2022-08-14 NOTE — Progress Notes (Signed)
[facility] Room Number: 524-A Place of Service:  ALF [phone]) Provider:  Hazle Nordmann, NP  PCP: Mahlon Gammon, MD  Patient Care Team: Mahlon Gammon, MD as PCP - General (Internal Medicine) Chrystie Nose, MD as PCP - Cardiology (Cardiology) Drema Dallas, DO as Consulting Physician (Neurology)  Extended Emergency Contact Information Primary Emergency Contact: [name],[name] Address: [address]          [place], [place] [address] Darden Amber of [place] Home Phone: [phone] Mobile Phone: [phone] Relation: Daughter Secondary Emergency Contact: Hollie Salk Address: [address]          [place], [place] [address] Darden Amber of [place] Mobile Phone: [phone] Relation: Son  Code Status:  DNR Goals of care: Advanced Directive information    08/14/2022   10:33 AM  Advanced Directives  Does Patient Have a Medical Advance Directive? Yes  Type of Estate agent of [place];Living will;Out of facility DNR (pink MOST or yellow form)  Does patient want to make changes to medical advance directive? No - Patient declined  Copy of Healthcare Power of Attorney in Chart? Yes - validated most recent copy scanned in chart (See row information)     Chief Complaint  Patient presents with   Acute Visit    Elevated blood pressure     HPI:  Pt is a 89 y.o. female seen today for an acute visit due to ongoing elevated blood pressure.   She currently resides on the assisted living unit at [facility]. PMH: afib, aortic atherosclerosis, HTN, carotid stenosis, tricuspid valve surgery 2014, TIA, neurocognitive disorder due to vascular disease, interstitial lung disease, colon cancer, diverticulosis, GERD, GI Bleed, hypothyroidism, RA, osteopenia, OAB, anemia and depression.   Manual blood pressures x 1 week provided by nurse today. Blood pressure continues to be elevated. 06/2022 Diovan was increased to 160 mg po daily.  She is also on metoprolol and clonidine prn. She denies chest pain, sob, headaches, blurred vision today. BUN/creat 19/0.76 08/06/2022 She does report feeling dizziness yesterday. No recent falls or injuries. See blood pressure trends below.   Recent blood pressures:  05/19- 152/88  05/18- 150/90, 148/82  05/17- 160/86, 144/72  05/16- 164/80, 150/90  05/15- 160/80, 164/78  05/14- 166/85, 148/92   Past Medical History:  Diagnosis Date   Allergy    seasonal   Anemia    Anxiety    Arthritis    Back   Asymptomatic menopausal state    Atrial fibrillation (HCC)    Cancer (HCC) 2006   Colon.  Basal Cell Skin cancer- right arm   Cataract    removed bilateral   Chronic atrial fibrillation (HCC)    Colon cancer (HCC)    Constipation due to pain medication therapy    after heart surgery   Deficiency of other specified B group vitamins    Diplopia    Disorientation, unspecified    Dysrhythmia    PAF   GERD (gastroesophageal reflux disease)    Heart murmur    Hyperlipidemia    Hypertension    Hypothyroidism    Malignant neoplasm of colon, unspecified (HCC)    Other specified diseases of blood and blood-forming organs    Other specified disorders of bone density and structure, unspecified forearm    Other thrombophilia (HCC)    per matrix   Overactive bladder    per Matrix   Personal history of other malignant neoplasm of large intestine  Presbycusis, bilateral    per matrix   RA (rheumatoid arthritis) (HCC)    Restless leg    Seizures (HCC)    after Heart Surgey   Stroke (HCC)    TIA- found by neurologist after    Valgus deformity, not elsewhere classified, right knee    per matrix   Vascular dementia, mild, without behavioral disturbance, psychotic disturbance, mood disturbance, and anxiety (HCC)    per matrix   Past Surgical History:  Procedure Laterality Date   ABDOMINAL HYSTERECTOMY  1970   Partial    COLON RESECTION  2006   cancer   COLON SURGERY      COLONOSCOPY     EYE SURGERY Bilateral    Cataract   MAXIMUM ACCESS (MAS)POSTERIOR LUMBAR INTERBODY FUSION (PLIF) 2 LEVEL N/A 04/12/2015   Procedure: Lumbar Three-Five Decompression, Pedicle Screw Fixation, and Posteriolateral Arthrodesis;  Surgeon: Maeola Harman, MD;  Location: [facility];  Service: Neurosurgery;  Laterality: N/A;  L3-4 L4-5 Maximum access posterior lumbar fusion, possible interbodies and resection of synovial cyst at L4-5   MITRAL VALVE REPAIR  01/20/2013   Gore-tex cords to P1, P2, and P3. Magic suture to posterior medial commisure, #30 Physio 1 ring. Done in [place]   TONSILLECTOMY     about 1940   TRICUSPID VALVE SURGERY  01/20/2013   #28 TriAd ring done in [place]    Allergies  Allergen Reactions   Claritin [Loratadine] Other (See Comments)    Listed on MAR Unknown reaction   Codeine Other (See Comments)    "just don't take it well"   Gabapentin Other (See Comments)    Dizziness    Molds & Smuts Other (See Comments)    Dust.  Reaction is not listed on MAR    Pollen Extract Swelling    Grass   Bee Venom Swelling and Rash   Wasp Venom Swelling and Rash    Allergies as of 08/14/2022       Reactions   Claritin [loratadine] Other (See Comments)   Listed on MAR Unknown reaction   Codeine Other (See Comments)   "just don't take it well"   Gabapentin Other (See Comments)   Dizziness    Molds & Smuts Other (See Comments)   Dust.  Reaction is not listed on MAR    Pollen Extract Swelling   Grass   Bee Venom Swelling, Rash   Wasp Venom Swelling, Rash        Medication List        Accurate as of Aug 14, 2022 10:34 AM. If you have any questions, ask your nurse or doctor.          acetaminophen 500 MG tablet Commonly known as: TYLENOL Take 1,000 mg by mouth in the morning and at bedtime.   atorvastatin 20 MG tablet Commonly known as: LIPITOR TAKE 1 TABLET DAILY   cloNIDine 0.1 MG tablet Commonly known as: CATAPRES Take 1 tablet (0.1 mg  total) by mouth daily as needed. If SBP >180 only   cyanocobalamin 1000 MCG tablet Commonly known as: VITAMIN B12 Take 1,000 mcg by mouth in the morning.   digoxin 0.125 MG tablet Commonly known as: LANOXIN TAKE 1 TABLET DAILY   Gemtesa 75 MG Tabs Generic drug: Vibegron Take 75 mg by mouth daily.   Iron (Ferrous Sulfate) 325 (65 Fe) MG Tabs Take 325 mg by mouth daily.   leflunomide 20 MG tablet Commonly known as: ARAVA Take 20 mg by mouth in the morning.  levalbuterol 0.63 MG/3ML nebulizer solution Commonly known as: XOPENEX Take 0.63 mg by nebulization 2 (two) times daily as needed for wheezing or shortness of breath. What changed: Another medication with the same name was removed. Continue taking this medication, and follow the directions you see here. Changed by: Octavia Heir, NP   levothyroxine 100 MCG tablet Commonly known as: Synthroid Take 1 tablet (100 mcg total) by mouth daily before breakfast.   melatonin 3 MG Tabs tablet Take 1 tablet (3 mg total) by mouth at bedtime as needed.   metoprolol succinate 50 MG 24 hr tablet Commonly known as: TOPROL-XL TAKE 1 TABLET DAILY   mirtazapine 15 MG tablet Commonly known as: REMERON TAKE 1 TABLET AT BEDTIME   MULTIVITAMIN ADULT PO Take 1 tablet by mouth every morning.   NON FORMULARY Fluid restriction 1800cc daily  1st- 900 cc 2nd- 700 cc 3rd- 200 cc   omeprazole 40 MG capsule Commonly known as: PRILOSEC TAKE 1 CAPSULE DAILY   ondansetron 4 MG tablet Commonly known as: ZOFRAN Take 4 mg by mouth every 6 (six) hours as needed for nausea or vomiting.   PreserVision AREDS 2 Caps Take 1 capsule by mouth in the morning and at bedtime.   sodium chloride 1 g tablet Take 1 g by mouth daily.   valsartan 160 MG tablet Commonly known as: DIOVAN Take 160 mg by mouth daily. What changed: Another medication with the same name was removed. Continue taking this medication, and follow the directions you see  here. Changed by: Octavia Heir, NP   Vitamin D 50 MCG (2000 UT) Caps Take 2,000 Units by mouth in the morning.        Review of Systems  Constitutional:  Negative for activity change and appetite change.  HENT:  Negative for congestion.   Eyes:  Negative for visual disturbance.  Respiratory:  Negative for cough, shortness of breath and wheezing.   Cardiovascular:  Positive for leg swelling. Negative for chest pain.  Gastrointestinal:  Negative for abdominal distention and abdominal pain.  Genitourinary:  Negative for dysuria.  Musculoskeletal:  Positive for gait problem.  Skin:  Negative for color change.  Neurological:  Positive for dizziness and weakness. Negative for light-headedness.  Psychiatric/Behavioral:  Positive for confusion. Negative for dysphoric mood. The patient is not nervous/anxious.     Immunization History  Administered Date(s) Administered   Fluad Quad(high Dose 65+) 01/05/2019, 12/07/2021   H1N1 04/07/2008   Influenza Whole 12/29/2008, 12/22/2009   Influenza, High Dose Seasonal PF 01/15/2012, 12/23/2014, 01/22/2020   Influenza,inj,Quad PF,6+ Mos 01/22/2014, 12/10/2016, 01/17/2018   Influenza,trivalent, recombinat, inj, PF 01/05/2016   Influenza-Unspecified 04/07/2008, 12/15/2014, 01/13/2021   Moderna Covid-19 Vaccine Bivalent Booster 2yrs & up 01/29/2022   Moderna Sars-Covid-2 Vaccination 04/07/2019, 05/05/2019, 02/09/2020   PFIZER(Purple Top)SARS-COV-2 Vaccination 01/06/2021   PPD Test 07/17/2010, 04/19/2015, 04/25/2015   Pfizer Covid-19 Vaccine Bivalent Booster 64yrs & up 07/19/2022   Pneumococcal Conjugate-13 11/10/2013   Pneumococcal Polysaccharide-23 12/27/2010, 12/05/2016   RSV,unspecified 03/28/2022   Tdap 04/15/2017   Zoster Recombinat (Shingrix) 04/09/2017, 06/18/2017   Pertinent  Health Maintenance Due  Topic Date Due   INFLUENZA VACCINE  10/25/2022   DEXA SCAN  Completed      03/10/2022    7:14 AM 03/22/2022   11:45 AM 05/21/2022     1:06 PM 06/06/2022    4:55 PM 07/09/2022    1:44 PM  Fall Risk  Falls in the past year?  1 1 1  0  Was  there an injury with Fall?  1 1 1  0  Fall Risk Category Calculator  3 3 3  0  Fall Risk Category (Retired)  High     (RETIRED) Patient Fall Risk Level High fall risk      Patient at Risk for Falls Due to  History of fall(s) History of fall(s);Impaired balance/gait;Impaired mobility History of fall(s);Impaired balance/gait;Impaired mobility History of fall(s);Impaired balance/gait;Impaired mobility  Fall risk Follow up  Falls evaluation completed Falls evaluation completed Falls evaluation completed;Education provided;Falls prevention discussed Falls evaluation completed   Functional Status Survey:    Vitals:   08/14/22 1023  BP: (!) 162/86  Pulse: 60  Resp: 19  Temp: (!) 97.3 F (36.3 C)  SpO2: 91%  Weight: 120 lb (54.4 kg)  Height: 5\' 2"  (1.575 m)   Body mass index is 21.95 kg/m. Physical Exam Vitals reviewed.  Constitutional:      General: She is not in acute distress. HENT:     Head: Normocephalic.  Eyes:     General:        Right eye: No discharge.        Left eye: No discharge.  Cardiovascular:     Rate and Rhythm: Normal rate and regular rhythm.     Pulses: Normal pulses.     Heart sounds: Normal heart sounds.  Pulmonary:     Effort: Pulmonary effort is normal.     Breath sounds: Normal breath sounds.  Abdominal:     General: Bowel sounds are normal. There is no distension.     Palpations: Abdomen is soft.     Tenderness: There is no abdominal tenderness.  Musculoskeletal:     Cervical back: Neck supple.     Right lower leg: Edema present.     Left lower leg: Edema present.     Comments: Non pitting  Skin:    General: Skin is warm.     Capillary Refill: Capillary refill takes less than 2 seconds.  Neurological:     General: No focal deficit present.     Mental Status: She is alert.     Motor: Weakness present.     Gait: Gait abnormal.  Psychiatric:         Mood and Affect: Mood normal.     Labs reviewed: Recent Labs    12/07/21 0548 12/20/21 2238 01/30/22 1809 02/03/22 0000 03/10/22 0820 03/15/22 0000 06/22/22 1619 08/06/22 1327  NA 137   < > 132*   < > 131* 132* 136 137  K 4.2   < > 4.3   < > 4.2 4.7 4.7 4.1  CL 104   < > 100   < > 98 100 99 106  CO2 23   < > 22   < > 26 23* 22 23  GLUCOSE 90   < > 116*   < > 97  --  100* 106*  BUN 15   < > 22   < > 26* 16 18 19   CREATININE 0.79   < > 0.88   < > 0.75 0.7 0.72 0.76  CALCIUM 9.1   < > 9.4   < > 8.9 8.8 9.5 9.4  MG 1.6*  --  1.9  --   --   --   --   --    < > = values in this interval not displayed.   Recent Labs    10/03/21 1055 03/06/22 1306 03/07/22 0100  AST 22 18 22   ALT 18 17 16  ALKPHOS 75 76 66  BILITOT 0.8 0.6 0.7  PROT 7.1 6.6 5.8*  ALBUMIN 4.5 3.8 3.1*   Recent Labs    03/06/22 1306 03/06/22 1930 04/17/22 1128 06/05/22 1432 08/06/22 1327  WBC 9.4   < > 7.5 8.9 7.6  NEUTROABS 6.9  --  4.8 5.8  --   HGB 8.3*   < > 11.3* 11.4* 11.7*  HCT 25.4*   < > 34.3* 33.9* 35.2*  MCV 104.1*   < > 100.9* 100.6* 100.9*  PLT 193   < > 186 187 170   < > = values in this interval not displayed.   Lab Results  Component Value Date   TSH 3.519 04/17/2022   Lab Results  Component Value Date   HGBA1C 5.5 01/12/2013   Lab Results  Component Value Date   CHOL 115 02/02/2021   HDL 48 02/02/2021   LDLCALC 49 02/02/2021   TRIG 91 02/02/2021    Significant Diagnostic Results in last 30 days:  DG Tibia/Fibula Right  Result Date: 08/06/2022 CLINICAL DATA:  Right lower leg pain and swelling EXAM: RIGHT TIBIA AND FIBULA - 2 VIEW COMPARISON:  01/30/2022 FINDINGS: No acute fracture or dislocation. No aggressive osseous lesion. Normal alignment. Generalized osteopenia. Healed right lateral malleolar fracture. Tricompartmental osteoarthritis of the right knee most severe in the lateral femorotibial compartment. Small plantar calcaneal spur. Mild osteoarthritis of the  talonavicular joint. Generalized soft tissue edema throughout the subcutaneous fat of the right lower leg. Peripheral vascular atherosclerotic disease. No radiopaque foreign body or soft tissue emphysema. IMPRESSION: 1. No acute osseous injury of the right tibia and fibula. 2. Generalized soft tissue edema throughout the subcutaneous fat of the right lower leg. Electronically Signed   By: Elige Ko M.D.   On: 08/06/2022 13:15   US Venous Img Lower Unilateral Right  Result Date: 08/06/2022 CLINICAL DATA:  Pain and swelling EXAM: Right LOWER EXTREMITY VENOUS DOPPLER ULTRASOUND TECHNIQUE: Gray-scale sonography with compression, as well as color and duplex ultrasound, were performed to evaluate the deep venous system(s) from the level of the common femoral vein through the popliteal and proximal calf veins. COMPARISON:  None Available. FINDINGS: VENOUS Normal compressibility of the common femoral, superficial femoral, and popliteal veins, as well as the visualized calf veins. Visualized portions of profunda femoral vein and great saphenous vein unremarkable. No filling defects to suggest DVT on grayscale or color Doppler imaging. Doppler waveforms show normal direction of venous flow, normal respiratory plasticity and response to augmentation. Limited views of the contralateral common femoral vein are unremarkable. OTHER There is edema in subcutaneous plane in right lower leg without any loculated fluid collections. Limitations: none IMPRESSION: There is no evidence of deep venous thrombosis in right lower extremity. Electronically Signed   By: Ernie Avena M.D.   On: 08/06/2022 13:01   DG Chest Portable 1 View  Result Date: 08/06/2022 CLINICAL DATA:  Right leg swelling and pain for a day. Recent ankle fracture EXAM: PORTABLE CHEST 1 VIEW COMPARISON:  Chest x-ray 03/07/2021 and older FINDINGS: Status post median sternotomy. Prosthetic cardiac valve. Borderline size heart. Apical pleural thickening with  stable interstitial changes. No consolidation, pneumothorax or effusion. No edema. Osteopenia and degenerative changes. IMPRESSION: Postop chest. Borderline size heart with chronic appearing interstitial and pleural changes. Electronically Signed   By: Karen Kays M.D.   On: 08/06/2022 12:19    Assessment/Plan 1. Essential hypertension - ongoing, goal < 150/90 - BUN/creat 19/0.76 08/06/2022 - Diovan increased to 160  mg las month - do not recommend increasing metoprolol due to dizziness reported - cont metoprolol and clonidine prn for SBP> 180 - will reduce sodium tablet to 500 mg/ 1/2 tablet - repeat bmp in 1 week - blood pressured BID x 1 week  2. Dizziness - orthostatic blood pressures x 1  - encourage hydration with water - discussed falls safety prevention    Family/ staff Communication: plan discussed with patient and nurse  Labs/tests ordered:   bmp in 1 week, blood pressures BID x 1 week

## 2022-08-15 DIAGNOSIS — F01A0 Vascular dementia, mild, without behavioral disturbance, psychotic disturbance, mood disturbance, and anxiety: Secondary | ICD-10-CM | POA: Diagnosis not present

## 2022-08-15 DIAGNOSIS — N39498 Other specified urinary incontinence: Secondary | ICD-10-CM | POA: Diagnosis not present

## 2022-08-15 DIAGNOSIS — R41841 Cognitive communication deficit: Secondary | ICD-10-CM | POA: Diagnosis not present

## 2022-08-15 DIAGNOSIS — S82891D Other fracture of right lower leg, subsequent encounter for closed fracture with routine healing: Secondary | ICD-10-CM | POA: Diagnosis not present

## 2022-08-15 DIAGNOSIS — Z9181 History of falling: Secondary | ICD-10-CM | POA: Diagnosis not present

## 2022-08-15 DIAGNOSIS — I4821 Permanent atrial fibrillation: Secondary | ICD-10-CM | POA: Diagnosis not present

## 2022-08-17 ENCOUNTER — Encounter: Payer: Self-pay | Admitting: Adult Health

## 2022-08-17 ENCOUNTER — Non-Acute Institutional Stay: Payer: Medicare Other | Admitting: Adult Health

## 2022-08-17 DIAGNOSIS — Z8679 Personal history of other diseases of the circulatory system: Secondary | ICD-10-CM | POA: Diagnosis not present

## 2022-08-17 DIAGNOSIS — I4821 Permanent atrial fibrillation: Secondary | ICD-10-CM

## 2022-08-17 DIAGNOSIS — I10 Essential (primary) hypertension: Secondary | ICD-10-CM

## 2022-08-17 DIAGNOSIS — J310 Chronic rhinitis: Secondary | ICD-10-CM

## 2022-08-17 DIAGNOSIS — R410 Disorientation, unspecified: Secondary | ICD-10-CM

## 2022-08-18 ENCOUNTER — Encounter: Payer: Self-pay | Admitting: Adult Health

## 2022-08-18 MED ORDER — METOPROLOL SUCCINATE ER 50 MG PO TB24: 75.0000 mg | ORAL_TABLET | Freq: Every day | ORAL | 0 refills | Status: DC

## 2022-08-18 NOTE — Progress Notes (Signed)
Location:  [facility] Room Number: [phone] Place of Service:  ALF [phone]) Provider:   Peggye Ley, ANP [facility] [phone]   Mahlon Gammon, MD  Patient Care Team: Mahlon Gammon, MD as PCP - General (Internal Medicine) Rennis Golden Lisette Abu, MD as PCP - Cardiology (Cardiology) Drema Dallas, DO as Consulting Physician (Neurology)  Extended Emergency Contact Information Primary Emergency Contact: [name],[name] Address: [address]          [place], [place] [address] Darden Amber of [place] Home Phone: [phone] Mobile Phone: [phone] Relation: Daughter Secondary Emergency Contact: Hollie Salk Address: [address]          [place], [place] [address] Darden Amber of [place] Mobile Phone: [phone] Relation: Son  Goals of care: Advanced Directive information    08/17/2022   12:51 PM  Advanced Directives  Does Patient Have a Medical Advance Directive? Yes  Type of Estate agent of [place];Living will;Out of facility DNR (pink MOST or yellow form)  Does patient want to make changes to medical advance directive? No - Patient declined  Copy of Healthcare Power of Attorney in Chart? Yes - validated most recent copy scanned in chart (See row information)     Chief Complaint  Patient presents with   Acute Visit    Patient is being being seen for confusion    HPI:  Pt is a 89 y.o. female seen today for an acute visit for confusion.   Ms. Walbridge resides in [place] with some underlying memory loss and is not able to recount a hx. The nurse reports that her daughter is noting more confusion and request a UA to be done.  The nurse in [place] has not noticed this issue but states she is coughing more and have nasal drainage. This is chronic issue but worse for the visit. No fever or sob are noted.   Ms. Deeg has had difficult to control HTN recently and also tachycardia.  Her daughter reports she has felt dizzy  intermittently. For the visit she denies dizziness but was seen on 5/21 for HTN and dizziness. Sodium tablets were decreased and a BMP ordered for 5/28.  Ms Defaria reports she is very tired and did not sleep well last night. Overall doesn't feel well.  She is sneezing and coughing a lot in the visit. No dysuria or bladder pain.   Orthostatic BPs 08/18/2022   148/104 97 lying 162/110 93 sitting  144/90 107 standing   Ms Poullard is not on anticoagulation for CVA risk reduction due to prior fall and bleeding risk which continues to be a concern. She has had several falls and one significant one last November which led to a right ankle fracture and prolonged skilled rehab stay. Past Medical History:  Diagnosis Date   Allergy    seasonal   Anemia    Anxiety    Arthritis    Back   Asymptomatic menopausal state    Atrial fibrillation (HCC)    Cancer (HCC) 2006   Colon.  Basal Cell Skin cancer- right arm   Cataract    removed bilateral   Chronic atrial fibrillation (HCC)    Colon cancer (HCC)    Constipation due to pain medication therapy    after heart surgery   Deficiency of other specified B group vitamins    Diplopia    Disorientation, unspecified    Dysrhythmia    PAF   GERD (gastroesophageal reflux disease)  Heart murmur    Hyperlipidemia    Hypertension    Hypothyroidism    Malignant neoplasm of colon, unspecified (HCC)    Other specified diseases of blood and blood-forming organs    Other specified disorders of bone density and structure, unspecified forearm    Other thrombophilia (HCC)    per matrix   Overactive bladder    per Matrix   Personal history of other malignant neoplasm of large intestine    Presbycusis, bilateral    per matrix   RA (rheumatoid arthritis) (HCC)    Restless leg    Seizures (HCC)    after Heart Surgey   Stroke (HCC)    TIA- found by neurologist after    Valgus deformity, not elsewhere classified, right knee    per matrix   Vascular  dementia, mild, without behavioral disturbance, psychotic disturbance, mood disturbance, and anxiety (HCC)    per matrix   Past Surgical History:  Procedure Laterality Date   ABDOMINAL HYSTERECTOMY  1970   Partial    COLON RESECTION  2006   cancer   COLON SURGERY     COLONOSCOPY     EYE SURGERY Bilateral    Cataract   MAXIMUM ACCESS (MAS)POSTERIOR LUMBAR INTERBODY FUSION (PLIF) 2 LEVEL N/A 04/12/2015   Procedure: Lumbar Three-Five Decompression, Pedicle Screw Fixation, and Posteriolateral Arthrodesis;  Surgeon: Maeola Harman, MD;  Location: [facility];  Service: Neurosurgery;  Laterality: N/A;  L3-4 L4-5 Maximum access posterior lumbar fusion, possible interbodies and resection of synovial cyst at L4-5   MITRAL VALVE REPAIR  01/20/2013   Gore-tex cords to P1, P2, and P3. Magic suture to posterior medial commisure, #30 Physio 1 ring. Done in [place]   TONSILLECTOMY     about 1940   TRICUSPID VALVE SURGERY  01/20/2013   #28 TriAd ring done in [place]    Allergies  Allergen Reactions   Claritin [Loratadine] Other (See Comments)    Listed on MAR Unknown reaction   Codeine Other (See Comments)    "just don't take it well"   Gabapentin Other (See Comments)    Dizziness    Molds & Smuts Other (See Comments)    Dust.  Reaction is not listed on MAR    Pollen Extract Swelling    Grass   Bee Venom Swelling and Rash   Wasp Venom Swelling and Rash    Outpatient Encounter Medications as of 08/17/2022  Medication Sig   acetaminophen (TYLENOL) 500 MG tablet Take 1,000 mg by mouth in the morning and at bedtime.   atorvastatin (LIPITOR) 20 MG tablet TAKE 1 TABLET DAILY   Cholecalciferol (VITAMIN D) 50 MCG (2000 UT) CAPS Take 2,000 Units by mouth in the morning.   cloNIDine (CATAPRES) 0.1 MG tablet Take 1 tablet (0.1 mg total) by mouth daily as needed. If SBP >180 only   digoxin (LANOXIN) 0.125 MG tablet TAKE 1 TABLET DAILY   Iron, Ferrous Sulfate, 325 (65 Fe) MG TABS Take 325 mg by  mouth daily.   leflunomide (ARAVA) 20 MG tablet Take 20 mg by mouth in the morning.   levalbuterol (XOPENEX) 0.63 MG/3ML nebulizer solution Take 0.63 mg by nebulization 2 (two) times daily as needed for wheezing or shortness of breath.   levothyroxine (SYNTHROID) 100 MCG tablet Take 1 tablet (100 mcg total) by mouth daily before breakfast.   melatonin 3 MG TABS tablet Take 1 tablet (3 mg total) by mouth at bedtime as needed.   metoprolol succinate (TOPROL-XL) 50 MG  24 hr tablet TAKE 1 TABLET DAILY (Patient taking differently: Take 75 mg by mouth daily.)   mirtazapine (REMERON) 15 MG tablet TAKE 1 TABLET AT BEDTIME   Multiple Vitamin (MULTIVITAMIN ADULT PO) Take 1 tablet by mouth every morning.   Multiple Vitamins-Minerals (PRESERVISION AREDS 2) CAPS Take 1 capsule by mouth in the morning and at bedtime.   NON FORMULARY Fluid restriction 1800cc daily  1st- 900 cc 2nd- 700 cc 3rd- 200 cc   omeprazole (PRILOSEC) 40 MG capsule TAKE 1 CAPSULE DAILY   ondansetron (ZOFRAN) 4 MG tablet Take 4 mg by mouth every 6 (six) hours as needed for nausea or vomiting.   sodium chloride 1 g tablet Take 0.5 g by mouth daily.   valsartan (DIOVAN) 160 MG tablet Take 160 mg by mouth daily.   Vibegron (GEMTESA) 75 MG TABS Take 75 mg by mouth daily.   vitamin B-12 (CYANOCOBALAMIN) 1000 MCG tablet Take 1,000 mcg by mouth in the morning.   No facility-administered encounter medications on file as of 08/17/2022.    Review of Systems  Constitutional:  Positive for fatigue. Negative for activity change, appetite change, chills, diaphoresis, fever and unexpected weight change.  HENT:  Positive for congestion, hearing loss (going to see audiology), rhinorrhea and sneezing. Negative for facial swelling, sinus pressure, sinus pain, sore throat, tinnitus and trouble swallowing.   Respiratory:  Positive for cough. Negative for shortness of breath and wheezing.   Cardiovascular:  Positive for leg swelling (improved). Negative  for chest pain and palpitations.  Gastrointestinal:  Negative for abdominal distention, abdominal pain, constipation and diarrhea.  Genitourinary:  Negative for difficulty urinating and dysuria.  Musculoskeletal:  Positive for arthralgias and gait problem. Negative for back pain, joint swelling and myalgias.  Neurological:  Negative for dizziness, tremors, seizures, syncope, facial asymmetry, speech difficulty, weakness, light-headedness, numbness and headaches.       Dizziness not present in the visit but noted prior to today  Hematological:  Does not bruise/bleed easily.  Psychiatric/Behavioral:  Positive for confusion. Negative for agitation and behavioral problems.     Immunization History  Administered Date(s) Administered   Fluad Quad(high Dose 65+) 01/05/2019, 12/07/2021   H1N1 04/07/2008   Influenza Whole 12/29/2008, 12/22/2009   Influenza, High Dose Seasonal PF 01/15/2012, 12/23/2014, 01/22/2020   Influenza,inj,Quad PF,6+ Mos 01/22/2014, 12/10/2016, 01/17/2018   Influenza,trivalent, recombinat, inj, PF 01/05/2016   Influenza-Unspecified 04/07/2008, 12/15/2014, 01/13/2021   Moderna Covid-19 Vaccine Bivalent Booster 47yrs & up 01/29/2022   Moderna Sars-Covid-2 Vaccination 04/07/2019, 05/05/2019, 02/09/2020   PFIZER(Purple Top)SARS-COV-2 Vaccination 01/06/2021   PPD Test 07/17/2010, 04/19/2015, 04/25/2015   Pfizer Covid-19 Vaccine Bivalent Booster 45yrs & up 07/19/2022   Pneumococcal Conjugate-13 11/10/2013   Pneumococcal Polysaccharide-23 12/27/2010, 12/05/2016   RSV,unspecified 03/28/2022   Tdap 04/15/2017   Zoster Recombinat (Shingrix) 04/09/2017, 06/18/2017   Pertinent  Health Maintenance Due  Topic Date Due   INFLUENZA VACCINE  10/25/2022   DEXA SCAN  Completed      03/10/2022    7:14 AM 03/22/2022   11:45 AM 05/21/2022    1:06 PM 06/06/2022    4:55 PM 07/09/2022    1:44 PM  Fall Risk  Falls in the past year?  1 1 1  0  Was there an injury with Fall?  1 1 1  0  Fall  Risk Category Calculator  3 3 3  0  Fall Risk Category (Retired)  High     (RETIRED) Patient Fall Risk Level High fall risk  Patient at Risk for Falls Due to  History of fall(s) History of fall(s);Impaired balance/gait;Impaired mobility History of fall(s);Impaired balance/gait;Impaired mobility History of fall(s);Impaired balance/gait;Impaired mobility  Fall risk Follow up  Falls evaluation completed Falls evaluation completed Falls evaluation completed;Education provided;Falls prevention discussed Falls evaluation completed   Functional Status Survey:    Vitals:   08/17/22 1237  BP: (!) 158/94  Pulse: 100  Resp: 19  Temp: 97.7 F (36.5 C)  TempSrc: Temporal  SpO2: 93%  Weight: 120 lb (54.4 kg)  Height: 5\' 2"  (1.575 m)   Body mass index is 21.95 kg/m. Physical Exam Vitals and nursing note reviewed.  Constitutional:      General: She is not in acute distress.    Appearance: She is not diaphoretic.  HENT:     Head: Normocephalic and atraumatic.     Right Ear: Tympanic membrane normal.     Left Ear: Tympanic membrane normal.     Nose: Congestion present.     Mouth/Throat:     Mouth: Mucous membranes are moist.     Pharynx: Oropharynx is clear.  Eyes:     Conjunctiva/sclera: Conjunctivae normal.     Pupils: Pupils are equal, round, and reactive to light.  Neck:     Vascular: No JVD.  Cardiovascular:     Rate and Rhythm: Tachycardia present. Rhythm irregular.     Comments: Apical 109 Pulmonary:     Effort: Pulmonary effort is normal. No respiratory distress.     Breath sounds: Normal breath sounds. No wheezing.  Musculoskeletal:     Cervical back: No rigidity or tenderness.     Right lower leg: Edema (+1) present.     Left lower leg: No edema.  Lymphadenopathy:     Cervical: No cervical adenopathy.  Skin:    General: Skin is warm and dry.  Neurological:     General: No focal deficit present.     Mental Status: She is alert. Mental status is at baseline.      Cranial Nerves: No cranial nerve deficit.  Psychiatric:        Mood and Affect: Mood normal.     Labs reviewed: Recent Labs    12/07/21 0548 12/20/21 2238 01/30/22 1809 02/03/22 0000 03/10/22 0820 03/15/22 0000 06/22/22 1619 08/06/22 1327  NA 137   < > 132*   < > 131* 132* 136 137  K 4.2   < > 4.3   < > 4.2 4.7 4.7 4.1  CL 104   < > 100   < > 98 100 99 106  CO2 23   < > 22   < > 26 23* 22 23  GLUCOSE 90   < > 116*   < > 97  --  100* 106*  BUN 15   < > 22   < > 26* 16 18 19   CREATININE 0.79   < > 0.88   < > 0.75 0.7 0.72 0.76  CALCIUM 9.1   < > 9.4   < > 8.9 8.8 9.5 9.4  MG 1.6*  --  1.9  --   --   --   --   --    < > = values in this interval not displayed.   Recent Labs    10/03/21 1055 03/06/22 1306 03/07/22 0100  AST 22 18 22   ALT 18 17 16   ALKPHOS 75 76 66  BILITOT 0.8 0.6 0.7  PROT 7.1 6.6 5.8*  ALBUMIN 4.5 3.8 3.1*  Recent Labs    03/06/22 1306 03/06/22 1930 04/17/22 1128 06/05/22 1432 08/06/22 1327  WBC 9.4   < > 7.5 8.9 7.6  NEUTROABS 6.9  --  4.8 5.8  --   HGB 8.3*   < > 11.3* 11.4* 11.7*  HCT 25.4*   < > 34.3* 33.9* 35.2*  MCV 104.1*   < > 100.9* 100.6* 100.9*  PLT 193   < > 186 187 170   < > = values in this interval not displayed.   Lab Results  Component Value Date   TSH 3.519 04/17/2022   Lab Results  Component Value Date   HGBA1C 5.5 01/12/2013   Lab Results  Component Value Date   CHOL 115 02/02/2021   HDL 48 02/02/2021   LDLCALC 49 02/02/2021   TRIG 91 02/02/2021    Significant Diagnostic Results in last 30 days:  DG Tibia/Fibula Right  Result Date: 08/06/2022 CLINICAL DATA:  Right lower leg pain and swelling EXAM: RIGHT TIBIA AND FIBULA - 2 VIEW COMPARISON:  01/30/2022 FINDINGS: No acute fracture or dislocation. No aggressive osseous lesion. Normal alignment. Generalized osteopenia. Healed right lateral malleolar fracture. Tricompartmental osteoarthritis of the right knee most severe in the lateral femorotibial compartment.  Small plantar calcaneal spur. Mild osteoarthritis of the talonavicular joint. Generalized soft tissue edema throughout the subcutaneous fat of the right lower leg. Peripheral vascular atherosclerotic disease. No radiopaque foreign body or soft tissue emphysema. IMPRESSION: 1. No acute osseous injury of the right tibia and fibula. 2. Generalized soft tissue edema throughout the subcutaneous fat of the right lower leg. Electronically Signed   By: Elige Ko M.D.   On: 08/06/2022 13:15   US Venous Img Lower Unilateral Right  Result Date: 08/06/2022 CLINICAL DATA:  Pain and swelling EXAM: Right LOWER EXTREMITY VENOUS DOPPLER ULTRASOUND TECHNIQUE: Gray-scale sonography with compression, as well as color and duplex ultrasound, were performed to evaluate the deep venous system(s) from the level of the common femoral vein through the popliteal and proximal calf veins. COMPARISON:  None Available. FINDINGS: VENOUS Normal compressibility of the common femoral, superficial femoral, and popliteal veins, as well as the visualized calf veins. Visualized portions of profunda femoral vein and great saphenous vein unremarkable. No filling defects to suggest DVT on grayscale or color Doppler imaging. Doppler waveforms show normal direction of venous flow, normal respiratory plasticity and response to augmentation. Limited views of the contralateral common femoral vein are unremarkable. OTHER There is edema in subcutaneous plane in right lower leg without any loculated fluid collections. Limitations: none IMPRESSION: There is no evidence of deep venous thrombosis in right lower extremity. Electronically Signed   By: Ernie Avena M.D.   On: 08/06/2022 13:01   DG Chest Portable 1 View  Result Date: 08/06/2022 CLINICAL DATA:  Right leg swelling and pain for a day. Recent ankle fracture EXAM: PORTABLE CHEST 1 VIEW COMPARISON:  Chest x-ray 03/07/2021 and older FINDINGS: Status post median sternotomy. Prosthetic cardiac  valve. Borderline size heart. Apical pleural thickening with stable interstitial changes. No consolidation, pneumothorax or effusion. No edema. Osteopenia and degenerative changes. IMPRESSION: Postop chest. Borderline size heart with chronic appearing interstitial and pleural changes. Electronically Signed   By: Karen Kays M.D.   On: 08/06/2022 12:19    Assessment/Plan  1. Other rhinitis Rapid covid swab negative Monitor for worsening  VS qshift.   2. Confusion Mild No focal deficit.  Will check UA  3. Essential hypertension Above goal which for her age and risk  would be <150/90 No orthostatic hypotension noted.  Increase metoprolol    4. Permanent atrial fibrillation (HCC) Rate is above goal and given that she is not able to take anticoagulation her stroke risk is increased. Controlling the rate will be important for her. She is on digoxin and metoprolol. Will increase metoprolol to 75 mg for rate control and HTN. She has an apt with cardiology can move up if not improving.   5. Hx of mitral valve disorder With hx of mitral and tricuspid repair 2014 Noted last echo 2020   Family/ staff Communication: Patric Dykes via [place].   Labs/tests ordered:  BMP, UA

## 2022-08-19 DIAGNOSIS — N390 Urinary tract infection, site not specified: Secondary | ICD-10-CM | POA: Diagnosis not present

## 2022-08-21 ENCOUNTER — Non-Acute Institutional Stay: Payer: Medicare Other | Admitting: Internal Medicine

## 2022-08-21 ENCOUNTER — Encounter: Payer: Self-pay | Admitting: Internal Medicine

## 2022-08-21 DIAGNOSIS — W19XXXA Unspecified fall, initial encounter: Secondary | ICD-10-CM

## 2022-08-21 DIAGNOSIS — Z9181 History of falling: Secondary | ICD-10-CM | POA: Diagnosis not present

## 2022-08-21 DIAGNOSIS — F01A0 Vascular dementia, mild, without behavioral disturbance, psychotic disturbance, mood disturbance, and anxiety: Secondary | ICD-10-CM | POA: Diagnosis not present

## 2022-08-21 DIAGNOSIS — I10 Essential (primary) hypertension: Secondary | ICD-10-CM | POA: Diagnosis not present

## 2022-08-21 DIAGNOSIS — R41841 Cognitive communication deficit: Secondary | ICD-10-CM | POA: Diagnosis not present

## 2022-08-21 DIAGNOSIS — S82891D Other fracture of right lower leg, subsequent encounter for closed fracture with routine healing: Secondary | ICD-10-CM | POA: Diagnosis not present

## 2022-08-21 DIAGNOSIS — I4821 Permanent atrial fibrillation: Secondary | ICD-10-CM | POA: Diagnosis not present

## 2022-08-21 DIAGNOSIS — N39498 Other specified urinary incontinence: Secondary | ICD-10-CM | POA: Diagnosis not present

## 2022-08-21 DIAGNOSIS — R410 Disorientation, unspecified: Secondary | ICD-10-CM | POA: Diagnosis not present

## 2022-08-21 DIAGNOSIS — D509 Iron deficiency anemia, unspecified: Secondary | ICD-10-CM

## 2022-08-21 DIAGNOSIS — R42 Dizziness and giddiness: Secondary | ICD-10-CM | POA: Diagnosis not present

## 2022-08-21 NOTE — Telephone Encounter (Signed)
Message routed to Christina Wert, NP.  

## 2022-08-21 NOTE — Progress Notes (Signed)
Location:  [facility] Room Number: [id number] Place of Service:  [facility] [id number]) Provider:  Mahlon Gammon, MD   Mahlon Gammon, MD  Patient Care Team: Mahlon Gammon, MD as PCP - General (Internal Medicine) Rennis Golden Lisette Abu, MD as PCP - Cardiology (Cardiology) Drema Dallas, DO as Consulting Physician (Neurology)  Extended Emergency Contact Information Primary Emergency Contact: [name],[name] Address: [address]          [place], [place] [address] Darden Amber of [place] Home Phone: [phone] Mobile Phone: [phone] Relation: Daughter Secondary Emergency Contact: Hollie Salk Address: [address]          [place], [place] [address] Darden Amber of [place] Mobile Phone: [phone] Relation: Son  Code Status:  DNR Goals of care: Advanced Directive information    08/21/2022    2:30 PM  Advanced Directives  Does Patient Have a Medical Advance Directive? Yes  Type of Estate agent of [place];Living will;Out of facility DNR (pink MOST or yellow form)  Does patient want to make changes to medical advance directive? No - Patient declined  Copy of Healthcare Power of Attorney in Chart? Yes - validated most recent copy scanned in chart (See row information)     Chief Complaint  Patient presents with   Acute Visit    Patient is being seen for acute dizziness    HPI:  Pt is a 89 y.o. female seen today for an acute visit for Follow up of Fall And Possible Dizziness  [place]  She had a fall over the weekend Per patient she was in the bathroom and her Knee buckled and she fell She did complain of dizziness also  EKG was done and Meclizine was ordered but she never got it Patients daughter also has noticed progressive confusion UA was also done which was negative Labs have been normal also  Today patient seemed to be back to her Baseline Walking with her walker Only thing I noticed was her speech was more  fragmented She will think long and hard for right words to come out She says she sometimes feel Dizzy when she stands up  Past Medical History:  Diagnosis Date   Allergy    seasonal   Anemia    Anxiety    Arthritis    Back   Asymptomatic menopausal state    Atrial fibrillation (HCC)    Cancer (HCC) 2006   Colon.  Basal Cell Skin cancer- right arm   Cataract    removed bilateral   Chronic atrial fibrillation (HCC)    Colon cancer (HCC)    Constipation due to pain medication therapy    after heart surgery   Deficiency of other specified B group vitamins    Diplopia    Disorientation, unspecified    Dysrhythmia    PAF   GERD (gastroesophageal reflux disease)    Heart murmur    Hyperlipidemia    Hypertension    Hypothyroidism    Malignant neoplasm of colon, unspecified (HCC)    Other specified diseases of blood and blood-forming organs    Other specified disorders of bone density and structure, unspecified forearm    Other thrombophilia (HCC)    per matrix   Overactive bladder    per Matrix   Personal history of other malignant neoplasm of large intestine    Presbycusis, bilateral    per matrix   RA (rheumatoid arthritis) (HCC)    Restless leg  Seizures (HCC)    after Heart Surgey   Stroke (HCC)    TIA- found by neurologist after    Valgus deformity, not elsewhere classified, right knee    per matrix   Vascular dementia, mild, without behavioral disturbance, psychotic disturbance, mood disturbance, and anxiety (HCC)    per matrix   Past Surgical History:  Procedure Laterality Date   ABDOMINAL HYSTERECTOMY  1970   Partial    COLON RESECTION  2006   cancer   COLON SURGERY     COLONOSCOPY     EYE SURGERY Bilateral    Cataract   MAXIMUM ACCESS (MAS)POSTERIOR LUMBAR INTERBODY FUSION (PLIF) 2 LEVEL N/A 04/12/2015   Procedure: Lumbar Three-Five Decompression, Pedicle Screw Fixation, and Posteriolateral Arthrodesis;  Surgeon: Maeola Harman, MD;  Location: [facility];  Service: Neurosurgery;  Laterality: N/A;  L3-4 L4-5 Maximum access posterior lumbar fusion, possible interbodies and resection of synovial cyst at L4-5   MITRAL VALVE REPAIR  01/20/2013   Gore-tex cords to P1, P2, and P3. Magic suture to posterior medial commisure, #30 Physio 1 ring. Done in [place]   TONSILLECTOMY     about 1940   TRICUSPID VALVE SURGERY  01/20/2013   #28 TriAd ring done in [place]    Allergies  Allergen Reactions   Claritin [Loratadine] Other (See Comments)    Listed on MAR Unknown reaction   Codeine Other (See Comments)    "just don't take it well"   Gabapentin Other (See Comments)    Dizziness    Molds & Smuts Other (See Comments)    Dust.  Reaction is not listed on MAR    Pollen Extract Swelling    Grass   Bee Venom Swelling and Rash   Wasp Venom Swelling and Rash    Outpatient Encounter Medications as of 08/21/2022  Medication Sig   acetaminophen (TYLENOL) 500 MG tablet Take 1,000 mg by mouth in the morning and at bedtime.   atorvastatin (LIPITOR) 20 MG tablet TAKE 1 TABLET DAILY   Cholecalciferol (VITAMIN D) 50 MCG (2000 UT) CAPS Take 2,000 Units by mouth in the morning.   cloNIDine (CATAPRES) 0.1 MG tablet Take 1 tablet (0.1 mg total) by mouth daily as needed. If SBP >180 only   digoxin (LANOXIN) 0.125 MG tablet TAKE 1 TABLET DAILY   Iron, Ferrous Sulfate, 325 (65 Fe) MG TABS Take 325 mg by mouth daily.   leflunomide (ARAVA) 20 MG tablet Take 20 mg by mouth in the morning.   levalbuterol (XOPENEX) 0.63 MG/3ML nebulizer solution Take 0.63 mg by nebulization 2 (two) times daily as needed for wheezing or shortness of breath.   levothyroxine (SYNTHROID) 100 MCG tablet Take 1 tablet (100 mcg total) by mouth daily before breakfast.   melatonin 3 MG TABS tablet Take 1 tablet (3 mg total) by mouth at bedtime as needed.   metoprolol succinate (TOPROL-XL) 50 MG 24 hr tablet Take 1.5 tablets (75 mg total) by mouth daily. Take with or immediately following a  meal.   mirtazapine (REMERON) 15 MG tablet TAKE 1 TABLET AT BEDTIME   Multiple Vitamin (MULTIVITAMIN ADULT PO) Take 1 tablet by mouth every morning.   Multiple Vitamins-Minerals (PRESERVISION AREDS 2) CAPS Take 1 capsule by mouth in the morning and at bedtime.   NON FORMULARY Fluid restriction 1800cc daily  1st- 900 cc 2nd- 700 cc 3rd- 200 cc   omeprazole (PRILOSEC) 40 MG capsule TAKE 1 CAPSULE DAILY   ondansetron (ZOFRAN) 4 MG tablet Take 4  mg by mouth every 6 (six) hours as needed for nausea or vomiting.   sodium chloride 1 g tablet Take 0.5 g by mouth daily.   valsartan (DIOVAN) 160 MG tablet Take 160 mg by mouth daily.   Vibegron (GEMTESA) 75 MG TABS Take 75 mg by mouth daily.   vitamin B-12 (CYANOCOBALAMIN) 1000 MCG tablet Take 1,000 mcg by mouth in the morning.   No facility-administered encounter medications on file as of 08/21/2022.    Review of Systems  Constitutional:  Positive for activity change. Negative for appetite change.  HENT: Negative.    Respiratory:  Negative for cough and shortness of breath.   Cardiovascular:  Negative for leg swelling.  Gastrointestinal:  Negative for constipation.  Genitourinary: Negative.   Musculoskeletal:  Positive for gait problem. Negative for arthralgias and myalgias.  Skin: Negative.   Neurological:  Positive for dizziness. Negative for weakness.  Psychiatric/Behavioral:  Positive for confusion. Negative for dysphoric mood and sleep disturbance.     Immunization History  Administered Date(s) Administered   Fluad Quad(high Dose 65+) 01/05/2019, 12/07/2021   H1N1 04/07/2008   Influenza Whole 12/29/2008, 12/22/2009   Influenza, High Dose Seasonal PF 01/15/2012, 12/23/2014, 01/22/2020   Influenza,inj,Quad PF,6+ Mos 01/22/2014, 12/10/2016, 01/17/2018   Influenza,trivalent, recombinat, inj, PF 01/05/2016   Influenza-Unspecified 04/07/2008, 12/15/2014, 01/13/2021   Moderna Covid-19 Vaccine Bivalent Booster 25yrs & up 01/29/2022    Moderna Sars-Covid-2 Vaccination 04/07/2019, 05/05/2019, 02/09/2020   PFIZER(Purple Top)SARS-COV-2 Vaccination 01/06/2021   PPD Test 07/17/2010, 04/19/2015, 04/25/2015   Pfizer Covid-19 Vaccine Bivalent Booster 46yrs & up 07/19/2022   Pneumococcal Conjugate-13 11/10/2013   Pneumococcal Polysaccharide-23 12/27/2010, 12/05/2016   RSV,unspecified 03/28/2022   Tdap 04/15/2017   Zoster Recombinat (Shingrix) 04/09/2017, 06/18/2017   Pertinent  Health Maintenance Due  Topic Date Due   INFLUENZA VACCINE  10/25/2022   DEXA SCAN  Completed      03/10/2022    7:14 AM 03/22/2022   11:45 AM 05/21/2022    1:06 PM 06/06/2022    4:55 PM 07/09/2022    1:44 PM  Fall Risk  Falls in the past year?  1 1 1  0  Was there an injury with Fall?  1 1 1  0  Fall Risk Category Calculator  3 3 3  0  Fall Risk Category (Retired)  High     (RETIRED) Patient Fall Risk Level High fall risk      Patient at Risk for Falls Due to  History of fall(s) History of fall(s);Impaired balance/gait;Impaired mobility History of fall(s);Impaired balance/gait;Impaired mobility History of fall(s);Impaired balance/gait;Impaired mobility  Fall risk Follow up  Falls evaluation completed Falls evaluation completed Falls evaluation completed;Education provided;Falls prevention discussed Falls evaluation completed   Functional Status Survey:    Vitals:   08/21/22 1420  BP: (!) 152/78  Pulse: 60  Resp: 16  Temp: 97.9 F (36.6 C)  TempSrc: Temporal  SpO2: 94%  Weight: 120 lb (54.4 kg)  Height: 5\' 2"  (1.575 m)   Body mass index is 21.95 kg/m. Physical Exam Vitals reviewed.  Constitutional:      Appearance: Normal appearance.  HENT:     Head: Normocephalic.     Nose: Nose normal.     Mouth/Throat:     Mouth: Mucous membranes are moist.     Pharynx: Oropharynx is clear.  Eyes:     Pupils: Pupils are equal, round, and reactive to light.  Cardiovascular:     Rate and Rhythm: Normal rate and regular rhythm.     Pulses:  Normal pulses.     Heart sounds: Normal heart sounds. No murmur heard. Pulmonary:     Effort: Pulmonary effort is normal.     Breath sounds: Normal breath sounds.  Abdominal:     General: Abdomen is flat. Bowel sounds are normal.     Palpations: Abdomen is soft.  Musculoskeletal:        General: No swelling.     Cervical back: Neck supple.  Skin:    General: Skin is warm.  Neurological:     General: No focal deficit present.     Mental Status: She is alert and oriented to person, place, and time.     Comments: No Nystagmus No Vertigo Was able to walk with her walker Gait was stable  Psychiatric:        Mood and Affect: Mood normal.        Thought Content: Thought content normal.     Labs reviewed: Recent Labs    12/07/21 0548 12/20/21 2238 01/30/22 1809 02/03/22 0000 03/10/22 0820 03/15/22 0000 06/22/22 1619 08/06/22 1327  NA 137   < > 132*   < > 131* 132* 136 137  K 4.2   < > 4.3   < > 4.2 4.7 4.7 4.1  CL 104   < > 100   < > 98 100 99 106  CO2 23   < > 22   < > 26 23* 22 23  GLUCOSE 90   < > 116*   < > 97  --  100* 106*  BUN 15   < > 22   < > 26* 16 18 19   CREATININE 0.79   < > 0.88   < > 0.75 0.7 0.72 0.76  CALCIUM 9.1   < > 9.4   < > 8.9 8.8 9.5 9.4  MG 1.6*  --  1.9  --   --   --   --   --    < > = values in this interval not displayed.   Recent Labs    10/03/21 1055 03/06/22 1306 03/07/22 0100  AST 22 18 22   ALT 18 17 16   ALKPHOS 75 76 66  BILITOT 0.8 0.6 0.7  PROT 7.1 6.6 5.8*  ALBUMIN 4.5 3.8 3.1*   Recent Labs    03/06/22 1306 03/06/22 1930 04/17/22 1128 06/05/22 1432 08/06/22 1327  WBC 9.4   < > 7.5 8.9 7.6  NEUTROABS 6.9  --  4.8 5.8  --   HGB 8.3*   < > 11.3* 11.4* 11.7*  HCT 25.4*   < > 34.3* 33.9* 35.2*  MCV 104.1*   < > 100.9* 100.6* 100.9*  PLT 193   < > 186 187 170   < > = values in this interval not displayed.   Lab Results  Component Value Date   TSH 3.519 04/17/2022   Lab Results  Component Value Date   HGBA1C 5.5  01/12/2013   Lab Results  Component Value Date   CHOL 115 02/02/2021   HDL 48 02/02/2021   LDLCALC 49 02/02/2021   TRIG 91 02/02/2021    Significant Diagnostic Results in last 30 days:  DG Tibia/Fibula Right  Result Date: 08/06/2022 CLINICAL DATA:  Right lower leg pain and swelling EXAM: RIGHT TIBIA AND FIBULA - 2 VIEW COMPARISON:  01/30/2022 FINDINGS: No acute fracture or dislocation. No aggressive osseous lesion. Normal alignment. Generalized osteopenia. Healed right lateral malleolar fracture. Tricompartmental osteoarthritis of the right knee most severe in the  lateral femorotibial compartment. Small plantar calcaneal spur. Mild osteoarthritis of the talonavicular joint. Generalized soft tissue edema throughout the subcutaneous fat of the right lower leg. Peripheral vascular atherosclerotic disease. No radiopaque foreign body or soft tissue emphysema. IMPRESSION: 1. No acute osseous injury of the right tibia and fibula. 2. Generalized soft tissue edema throughout the subcutaneous fat of the right lower leg. Electronically Signed   By: Elige Ko M.D.   On: 08/06/2022 13:15   US Venous Img Lower Unilateral Right  Result Date: 08/06/2022 CLINICAL DATA:  Pain and swelling EXAM: Right LOWER EXTREMITY VENOUS DOPPLER ULTRASOUND TECHNIQUE: Gray-scale sonography with compression, as well as color and duplex ultrasound, were performed to evaluate the deep venous system(s) from the level of the common femoral vein through the popliteal and proximal calf veins. COMPARISON:  None Available. FINDINGS: VENOUS Normal compressibility of the common femoral, superficial femoral, and popliteal veins, as well as the visualized calf veins. Visualized portions of profunda femoral vein and great saphenous vein unremarkable. No filling defects to suggest DVT on grayscale or color Doppler imaging. Doppler waveforms show normal direction of venous flow, normal respiratory plasticity and response to augmentation. Limited  views of the contralateral common femoral vein are unremarkable. OTHER There is edema in subcutaneous plane in right lower leg without any loculated fluid collections. Limitations: none IMPRESSION: There is no evidence of deep venous thrombosis in right lower extremity. Electronically Signed   By: Ernie Avena M.D.   On: 08/06/2022 13:01   DG Chest Portable 1 View  Result Date: 08/06/2022 CLINICAL DATA:  Right leg swelling and pain for a day. Recent ankle fracture EXAM: PORTABLE CHEST 1 VIEW COMPARISON:  Chest x-ray 03/07/2021 and older FINDINGS: Status post median sternotomy. Prosthetic cardiac valve. Borderline size heart. Apical pleural thickening with stable interstitial changes. No consolidation, pneumothorax or effusion. No edema. Osteopenia and degenerative changes. IMPRESSION: Postop chest. Borderline size heart with chronic appearing interstitial and pleural changes. Electronically Signed   By: Karen Kays M.D.   On: 08/06/2022 12:19    Assessment/Plan 1. Fall, initial encounter Is on Neuro checks Seems more Mechanical then Syncope   2. Dizziness Not having today Seemed very mild Do not think she has vertigo Discontinue Meclizine Will continue to monitor  3. Confusion I did notice that her Aphasia is worse   4. Permanent atrial fibrillation (HCC) Not on DOCA due to her frailty and fall  5. Essential hypertension On Diovan and Toprol  6. Iron deficiency anemia, unspecified iron deficiency anemia type On Iron    Family/ staff Communication:   Labs/tests ordered:

## 2022-08-23 DIAGNOSIS — I10 Essential (primary) hypertension: Secondary | ICD-10-CM | POA: Diagnosis not present

## 2022-08-25 DIAGNOSIS — F01A0 Vascular dementia, mild, without behavioral disturbance, psychotic disturbance, mood disturbance, and anxiety: Secondary | ICD-10-CM | POA: Diagnosis not present

## 2022-08-25 DIAGNOSIS — R41841 Cognitive communication deficit: Secondary | ICD-10-CM | POA: Diagnosis not present

## 2022-08-27 ENCOUNTER — Encounter: Payer: Medicare Other | Admitting: Adult Health

## 2022-08-28 DIAGNOSIS — R41841 Cognitive communication deficit: Secondary | ICD-10-CM | POA: Diagnosis not present

## 2022-08-28 DIAGNOSIS — F01A0 Vascular dementia, mild, without behavioral disturbance, psychotic disturbance, mood disturbance, and anxiety: Secondary | ICD-10-CM | POA: Diagnosis not present

## 2022-08-28 NOTE — Progress Notes (Deleted)
NEUROLOGY FOLLOW UP OFFICE NOTE  Kathy Velez [id number]  Assessment/Plan:   Major vascular neurocognitive disorder, mild Atrial fibrillation History of TIAs and left frontal lobe infarct, likely cardioembolic Cerebral amyloid angiopathy Hypertension Hyperlipidemia  Due to history of recurrent falls, cerebral amyloid angiopathy and HasBled Score of 3, I would recommend to not resume anticoagulation.  Once her hematoma heals and is cleared, would instead start ASA 81mg  daily.  Patient and daughter do recognize increased risk of cardioembolic stroke, but I feel her high risk of hemorrhagic stroke outweighs benefits of anticoagulation.   1.  Will try another cholinesterase inhibitor:  rivastigmine 1.5mg  twice daily for 4 weeks, then 3mg  twice daily 2.  Secondary stroke prevention as per PCP/cardiology: - When cleared, would recommend ASA 81mg  rather than anticoagulation. - Statin (LDL goal less than 70) - Normotensive blood pressure - follow up with PCP - Glycemic control (Hgb A1c goal less than 7) 3. Follow up 6 months with Marlowe Kays, PA-C   Subjective:  Kathy Velez is an 89 year old right-handed white female with paroxysmal atrial fibrillation on Eliquis, HTN, hypothyroidism, interstitial lung disease, rheumatoid arthritis and history of stroke and colon cancer who follows up for [facility].     UPDATE: Current medications:  Eliquis; digoxin; atorvastatin 20mg ; Toprol XL; valsartan   She is in independent living in [place].  They have transportation available to take her shopping.  Her daughter sets up her pillbox for her.  Her balance is still an issue.  She had handle bars installed in the shower and at the toilet.  Using her walker in the apartment.  She notices slight worsening in short-term memory but not significant.  When she is out of her element, such as visiting with her family to their vacation house, she may be a little more confused.   He had been on Eliquis for  permanent a fib.  She has had several falls.  In July, she fell and developed a large scalp hematoma.  Last month, she developed perfuse arterial bleeding.  CT head personally reviewed demonstrated the small scalp hematoma overlying the right parietal bone but no fracture or intracranial abnormality.  She underwent acute intervention complicated by blood loss requiring transfusion.  Eliquis was placed on hold.     HISTORY: On 04/20/2019, she presented to the [facility] ED after having word-finding difficulties and making paraphasic errors lasting a couple of hours.  She was slurring her words and wasn't able to answer basic questions.  No associated facial droop, numbness or weakness.  She has some baseline diplopia but without change.  She had some cough but no fever or shortness of breath.  Labs were unremarkable, including CBC, CMP, TSH (2.309) and testing for flu and Covid.  CXR showed no active pulmonary disease.  EKG showed known atrial fibrillation but no acute ischemic cardiac event.  CT head without contrast showed chronic small vessel disease but no acute findings.  MRI of brain without contrast showed 2 punctate subacute infarcts in the left corona radiata and occipital pole subcortical white matter. Otherwise chronic small vessel ischemic changes with few scattered chronic microhemorrhages in the left occipital pole and central pons and small remote lacunar infarcts in right cerebellum.  No change was made to antiplatelet and anticoagulation therapy.  She is on ASA and Eliquis.  Several months prior, she had a severe left sided headache and noted vision loss on the left side of her vision while watching TV.  It lasted  2 hours.   She had another stroke in July 2021, while in the [facility], in which she had confusion, language difficulty and transient right facial droop.  She was transferred to [facility] on 09/27/2019.  MRI of brain showed an acute infarct in the left  frontal corona radiata.  Again demonstrated multiple areas of remote microhemorrhages in the peripheral cerebral hemispheres as well as pons, suggesting hypertensive hemorrhage with underlying cerebral amyloid angiopathy.  CTA head and neck showed no large vessel intracranial occlusion or significant stenosis but did demonstrate atherosclerotic plaque in both ICAs causing less than 50% stenosis on the right and 50% stenosis on the left.  Echocardiogram was negative for thrombus.  LDL 46.  HGB A1c 5.1.  Telemetry demonstrated atrial fibrillation.  Due to possible cerebral amyloid angiopathy, ASA was discontinued.  Since her stroke in July, she has had ongoing short-term memory problems.   On 03/28/2020, she developed sudden onset of right sided numbness and tingling involving her face, tongue and hand lasting about 20 minutes and resolved.  No associated weakness.  The next day, she was was seen by the nurse at [facility] who advised her to go the ED for evaluation.  CT head personally reviewed showed no acute intracranial abnormality.  Due to recent work up previously performed and mild sensory symptoms that have since resolved, she was discharged in stable condition with no change in management   She reports prior history of TIA with right sided weakness.   Previous studies: 11/27/2018 ECHOCARDIOGRAM:  EF 60-65% with no cardiac source of emboli 02/03/2019 LABS:  LDL 58; B12 1,559 03/06/2019 CAROTID DOPPLER:  Moderate heterogeneous and calcified plaque with no hemodynamically significant stenosis   Past medication:  donepezil (leg cramps), rivastigmine  PAST MEDICAL HISTORY: Past Medical History:  Diagnosis Date   Allergy    seasonal   Anemia    Anxiety    Arthritis    Back   Asymptomatic menopausal state    Atrial fibrillation (HCC)    Cancer (HCC) 2006   Colon.  Basal Cell Skin cancer- right arm   Cataract    removed bilateral   Chronic atrial fibrillation (HCC)    Colon cancer (HCC)     Constipation due to pain medication therapy    after heart surgery   Deficiency of other specified B group vitamins    Diplopia    Disorientation, unspecified    Dysrhythmia    PAF   GERD (gastroesophageal reflux disease)    Heart murmur    Hyperlipidemia    Hypertension    Hypothyroidism    Malignant neoplasm of colon, unspecified (HCC)    Other specified diseases of blood and blood-forming organs    Other specified disorders of bone density and structure, unspecified forearm    Other thrombophilia (HCC)    per matrix   Overactive bladder    per Matrix   Personal history of other malignant neoplasm of large intestine    Presbycusis, bilateral    per matrix   RA (rheumatoid arthritis) (HCC)    Restless leg    Seizures (HCC)    after Heart Surgey   Stroke (HCC)    TIA- found by neurologist after    Valgus deformity, not elsewhere classified, right knee    per matrix   Vascular dementia, mild, without behavioral disturbance, psychotic disturbance, mood disturbance, and anxiety (HCC)    per matrix    MEDICATIONS: Current Outpatient Medications on File Prior  to Visit  Medication Sig Dispense Refill   acetaminophen (TYLENOL) 500 MG tablet Take 1,000 mg by mouth in the morning and at bedtime.     atorvastatin (LIPITOR) 20 MG tablet TAKE 1 TABLET DAILY 90 tablet 3   Cholecalciferol (VITAMIN D) 50 MCG (2000 UT) CAPS Take 2,000 Units by mouth in the morning.     cloNIDine (CATAPRES) 0.1 MG tablet Take 1 tablet (0.1 mg total) by mouth daily as needed. If SBP >180 only 30 tablet 0   digoxin (LANOXIN) 0.125 MG tablet TAKE 1 TABLET DAILY 90 tablet 3   Iron, Ferrous Sulfate, 325 (65 Fe) MG TABS Take 325 mg by mouth daily. 30 tablet 0   leflunomide (ARAVA) 20 MG tablet Take 20 mg by mouth in the morning.     levalbuterol (XOPENEX) 0.63 MG/3ML nebulizer solution Take 0.63 mg by nebulization 2 (two) times daily as needed for wheezing or shortness of breath.     levothyroxine (SYNTHROID)  100 MCG tablet Take 1 tablet (100 mcg total) by mouth daily before breakfast. 90 tablet 2   melatonin 3 MG TABS tablet Take 1 tablet (3 mg total) by mouth at bedtime as needed.  0   metoprolol succinate (TOPROL-XL) 50 MG 24 hr tablet Take 1.5 tablets (75 mg total) by mouth daily. Take with or immediately following a meal. 30 tablet 0   mirtazapine (REMERON) 15 MG tablet TAKE 1 TABLET AT BEDTIME 90 tablet 3   Multiple Vitamin (MULTIVITAMIN ADULT PO) Take 1 tablet by mouth every morning.     Multiple Vitamins-Minerals (PRESERVISION AREDS 2) CAPS Take 1 capsule by mouth in the morning and at bedtime.     NON FORMULARY Fluid restriction 1800cc daily  1st- 900 cc 2nd- 700 cc 3rd- 200 cc     omeprazole (PRILOSEC) 40 MG capsule TAKE 1 CAPSULE DAILY 90 capsule 3   ondansetron (ZOFRAN) 4 MG tablet Take 4 mg by mouth every 6 (six) hours as needed for nausea or vomiting.     sodium chloride 1 g tablet Take 0.5 g by mouth daily.     valsartan (DIOVAN) 160 MG tablet Take 160 mg by mouth daily.     Vibegron (GEMTESA) 75 MG TABS Take 75 mg by mouth daily.     vitamin B-12 (CYANOCOBALAMIN) 1000 MCG tablet Take 1,000 mcg by mouth in the morning.     No current facility-administered medications on file prior to visit.    ALLERGIES: Allergies  Allergen Reactions   Claritin [Loratadine] Other (See Comments)    Listed on MAR Unknown reaction   Codeine Other (See Comments)    "just don't take it well"   Gabapentin Other (See Comments)    Dizziness    Molds & Smuts Other (See Comments)    Dust.  Reaction is not listed on MAR    Pollen Extract Swelling    Grass   Bee Venom Swelling and Rash   Wasp Venom Swelling and Rash    FAMILY HISTORY: Family History  Problem Relation Age of Onset   Lung disease Mother 58   Heart disease Father 50   Colon polyps Daughter    Alcohol abuse Son    Colon cancer Neg Hx    Stomach cancer Neg Hx    Rectal cancer Neg Hx    Crohn's disease Neg Hx    Esophageal  cancer Neg Hx       Objective:  Blood pressure (!) 142/70, pulse 93, height 5\' 2"  (1.575  m), weight 125 lb 3.2 oz (56.8 kg), SpO2 99 %. General: No acute distress.  Patient appears well-groomed.   Head:  Hematoma in  Eyes:  Fundi examined but not visualized Neck: supple, no paraspinal tenderness, full range of motion Heart:  Regular rate and rhythm Lungs:  Clear to auscultation bilaterally Back: No paraspinal tenderness Neurological Exam:     03/01/2020    4:00 PM 03/10/2021    1:00 PM 01/01/2022   12:00 PM  [employer] Mental Exam  Weekday Correct 1 1 1   Current year 1 1 1   What state are we in? 1 1 1   Amount spent 1 1 1   Amount left 2 2 2   # of Animals 2 2 1  5  objects recall 2 2 2   Number series 1 2 1   Hour markers 2 2 2   Time correct 0 2 2  Placed X in triangle correctly 1 1 1   Largest Figure 1 1 1   Name of female 2 2 2   Date back to work 2 0 0  Type of work 2 0 2  State she lived in 0 0 2  Total score 21 20 22    CN II-XII intact. Bulk and tone normal, muscle strength 5/5 throughout.  Sensation to light touch intact.  Deep tendon reflexes absent throughout, toes downgoing.  Finger to nose testing intact.  Broad-based cautious gait with assistance of walker.   Shon Millet, DO  CC: Einar Crow, MD

## 2022-08-29 ENCOUNTER — Encounter: Payer: Self-pay | Admitting: Physician Assistant

## 2022-08-29 ENCOUNTER — Ambulatory Visit (INDEPENDENT_AMBULATORY_CARE_PROVIDER_SITE_OTHER): Payer: Medicare Other | Admitting: Physician Assistant

## 2022-08-29 ENCOUNTER — Ambulatory Visit: Payer: Medicare Other | Admitting: Neurology

## 2022-08-29 VITALS — BP 169/89 | Resp 20 | Ht 62.0 in | Wt 123.0 lb

## 2022-08-29 DIAGNOSIS — F01A0 Vascular dementia, mild, without behavioral disturbance, psychotic disturbance, mood disturbance, and anxiety: Secondary | ICD-10-CM | POA: Diagnosis not present

## 2022-08-29 NOTE — Patient Instructions (Addendum)
It was a pleasure to see you today at our office.   Recommendations:  Follow up in  6 months Make sure that mood is control. Consider taking a mood medicine.  Dec 12 at 11:30 follow up  If there is increased confusion, think of UTI first     For assessment of decision of mental capacity and competency:  Call Dr. Erick Blinks, geriatric psychiatrist at [phone]  Counseling regarding caregiver distress, including caregiver depression, anxiety and issues regarding community resources, adult day care programs, adult living facilities, or memory care questions:  please contact your  Primary Doctor's Social Worker   Whom to call: Memory  decline, memory medications: Call our office [phone]  For psychiatric meds, mood meds: Please have your primary care physician manage these medications.  If you have any severe symptoms of a stroke, or other severe issues such as confusion,severe chills or fever, etc call 911 or go to the ER as you may need to be evaluated further       RECOMMENDATIONS FOR ALL PATIENTS WITH MEMORY PROBLEMS: 1. Continue to exercise (Recommend 30 minutes of walking everyday, or 3 hours every week) 2. Increase social interactions - continue going to [place] and enjoy social gatherings with friends and family 3. Eat healthy, avoid fried foods and eat more fruits and vegetables 4. Maintain adequate blood pressure, blood sugar, and blood cholesterol level. Reducing the risk of stroke and cardiovascular disease also helps promoting better memory. 5. Avoid stressful situations. Live a simple life and avoid aggravations. Organize your time and prepare for the next day in anticipation. 6. Sleep well, avoid any interruptions of sleep and avoid any distractions in the bedroom that may interfere with adequate sleep quality 7. Avoid sugar, avoid sweets as there is a strong link between excessive sugar intake, diabetes, and cognitive impairment We discussed the Mediterranean diet,  which has been shown to help patients reduce the risk of progressive memory disorders and reduces cardiovascular risk. This includes eating fish, eat fruits and green leafy vegetables, nuts like almonds and hazelnuts, walnuts, and also use olive oil. Avoid fast foods and fried foods as much as possible. Avoid sweets and sugar as sugar use has been linked to worsening of memory function.  There is always a concern of gradual progression of memory problems. If this is the case, then we may need to adjust level of care according to patient needs. Support, both to the patient and caregiver, should then be put into place.    FALL PRECAUTIONS: Be cautious when walking. Scan the area for obstacles that may increase the risk of trips and falls. When getting up in the mornings, sit up at the edge of the bed for a few minutes before getting out of bed. Consider elevating the bed at the head end to avoid drop of blood pressure when getting up. Walk always in a well-lit room (use night lights in the walls). Avoid area rugs or power cords from appliances in the middle of the walkways. Use a walker or a cane if necessary and consider physical therapy for balance exercise. Get your eyesight checked regularly.  FINANCIAL OVERSIGHT: Supervision, especially oversight when making financial decisions or transactions is also recommended.  HOME SAFETY: Consider the safety of the kitchen when operating appliances like stoves, microwave oven, and blender. Consider having supervision and share cooking responsibilities until no longer able to participate in those. Accidents with firearms and other hazards in the house should be identified and addressed as  well.   ABILITY TO BE LEFT ALONE: If patient is unable to contact 911 operator, consider using LifeLine, or when the need is there, arrange for someone to stay with patients. Smoking is a fire hazard, consider supervision or cessation. Risk of wandering should be assessed by  caregiver and if detected at any point, supervision and safe proof recommendations should be instituted.  MEDICATION SUPERVISION: Inability to self-administer medication needs to be constantly addressed. Implement a mechanism to ensure safe administration of the medications.
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
## 2022-08-31 DIAGNOSIS — F01A0 Vascular dementia, mild, without behavioral disturbance, psychotic disturbance, mood disturbance, and anxiety: Secondary | ICD-10-CM | POA: Diagnosis not present

## 2022-08-31 DIAGNOSIS — R41841 Cognitive communication deficit: Secondary | ICD-10-CM | POA: Diagnosis not present

## 2022-09-04 DIAGNOSIS — F01A0 Vascular dementia, mild, without behavioral disturbance, psychotic disturbance, mood disturbance, and anxiety: Secondary | ICD-10-CM | POA: Diagnosis not present

## 2022-09-04 DIAGNOSIS — R41841 Cognitive communication deficit: Secondary | ICD-10-CM | POA: Diagnosis not present

## 2022-09-07 DIAGNOSIS — F01A0 Vascular dementia, mild, without behavioral disturbance, psychotic disturbance, mood disturbance, and anxiety: Secondary | ICD-10-CM | POA: Diagnosis not present

## 2022-09-07 DIAGNOSIS — R41841 Cognitive communication deficit: Secondary | ICD-10-CM | POA: Diagnosis not present

## 2022-09-10 DIAGNOSIS — R41841 Cognitive communication deficit: Secondary | ICD-10-CM | POA: Diagnosis not present

## 2022-09-10 DIAGNOSIS — F01A0 Vascular dementia, mild, without behavioral disturbance, psychotic disturbance, mood disturbance, and anxiety: Secondary | ICD-10-CM | POA: Diagnosis not present

## 2022-09-14 DIAGNOSIS — R41841 Cognitive communication deficit: Secondary | ICD-10-CM | POA: Diagnosis not present

## 2022-09-14 DIAGNOSIS — F01A0 Vascular dementia, mild, without behavioral disturbance, psychotic disturbance, mood disturbance, and anxiety: Secondary | ICD-10-CM | POA: Diagnosis not present

## 2022-09-18 DIAGNOSIS — R41841 Cognitive communication deficit: Secondary | ICD-10-CM | POA: Diagnosis not present

## 2022-09-18 DIAGNOSIS — F01A0 Vascular dementia, mild, without behavioral disturbance, psychotic disturbance, mood disturbance, and anxiety: Secondary | ICD-10-CM | POA: Diagnosis not present

## 2022-09-21 DIAGNOSIS — F01A0 Vascular dementia, mild, without behavioral disturbance, psychotic disturbance, mood disturbance, and anxiety: Secondary | ICD-10-CM | POA: Diagnosis not present

## 2022-09-21 DIAGNOSIS — R41841 Cognitive communication deficit: Secondary | ICD-10-CM | POA: Diagnosis not present

## 2022-10-01 DIAGNOSIS — R41841 Cognitive communication deficit: Secondary | ICD-10-CM | POA: Diagnosis not present

## 2022-10-01 DIAGNOSIS — F01A0 Vascular dementia, mild, without behavioral disturbance, psychotic disturbance, mood disturbance, and anxiety: Secondary | ICD-10-CM | POA: Diagnosis not present

## 2022-10-02 DIAGNOSIS — R41841 Cognitive communication deficit: Secondary | ICD-10-CM | POA: Diagnosis not present

## 2022-10-02 DIAGNOSIS — F01A0 Vascular dementia, mild, without behavioral disturbance, psychotic disturbance, mood disturbance, and anxiety: Secondary | ICD-10-CM | POA: Diagnosis not present

## 2022-10-04 DIAGNOSIS — M0579 Rheumatoid arthritis with rheumatoid factor of multiple sites without organ or systems involvement: Secondary | ICD-10-CM | POA: Diagnosis not present

## 2022-10-05 DIAGNOSIS — R41841 Cognitive communication deficit: Secondary | ICD-10-CM | POA: Diagnosis not present

## 2022-10-05 DIAGNOSIS — F01A0 Vascular dementia, mild, without behavioral disturbance, psychotic disturbance, mood disturbance, and anxiety: Secondary | ICD-10-CM | POA: Diagnosis not present

## 2022-10-09 DIAGNOSIS — R41841 Cognitive communication deficit: Secondary | ICD-10-CM | POA: Diagnosis not present

## 2022-10-09 DIAGNOSIS — F01A0 Vascular dementia, mild, without behavioral disturbance, psychotic disturbance, mood disturbance, and anxiety: Secondary | ICD-10-CM | POA: Diagnosis not present

## 2022-10-11 ENCOUNTER — Inpatient Hospital Stay: Payer: Medicare Other | Attending: Oncology

## 2022-10-11 ENCOUNTER — Inpatient Hospital Stay: Payer: Medicare Other | Admitting: Oncology

## 2022-10-11 VITALS — BP 136/82 | HR 92 | Temp 98.1°F | Resp 18 | Ht 62.0 in | Wt 126.0 lb

## 2022-10-11 DIAGNOSIS — C185 Malignant neoplasm of splenic flexure: Secondary | ICD-10-CM | POA: Diagnosis not present

## 2022-10-11 DIAGNOSIS — Z8673 Personal history of transient ischemic attack (TIA), and cerebral infarction without residual deficits: Secondary | ICD-10-CM | POA: Insufficient documentation

## 2022-10-11 DIAGNOSIS — E039 Hypothyroidism, unspecified: Secondary | ICD-10-CM | POA: Diagnosis not present

## 2022-10-11 DIAGNOSIS — D539 Nutritional anemia, unspecified: Secondary | ICD-10-CM | POA: Insufficient documentation

## 2022-10-11 DIAGNOSIS — M069 Rheumatoid arthritis, unspecified: Secondary | ICD-10-CM | POA: Insufficient documentation

## 2022-10-11 DIAGNOSIS — Z08 Encounter for follow-up examination after completed treatment for malignant neoplasm: Secondary | ICD-10-CM | POA: Insufficient documentation

## 2022-10-11 DIAGNOSIS — I4891 Unspecified atrial fibrillation: Secondary | ICD-10-CM | POA: Diagnosis not present

## 2022-10-11 DIAGNOSIS — R5383 Other fatigue: Secondary | ICD-10-CM

## 2022-10-11 DIAGNOSIS — Z7901 Long term (current) use of anticoagulants: Secondary | ICD-10-CM | POA: Insufficient documentation

## 2022-10-11 DIAGNOSIS — Z85038 Personal history of other malignant neoplasm of large intestine: Secondary | ICD-10-CM | POA: Insufficient documentation

## 2022-10-11 DIAGNOSIS — G3184 Mild cognitive impairment, so stated: Secondary | ICD-10-CM | POA: Diagnosis not present

## 2022-10-11 LAB — CBC WITH DIFFERENTIAL (CANCER CENTER ONLY)
Abs Immature Granulocytes: 0.03 10*3/uL (ref 0.00–0.07)
Basophils Absolute: 0.1 10*3/uL (ref 0.0–0.1)
Basophils Relative: 1 %
Eosinophils Absolute: 0.1 10*3/uL (ref 0.0–0.5)
Eosinophils Relative: 1 %
HCT: 35.6 % — ABNORMAL LOW (ref 36.0–46.0)
Hemoglobin: 11.9 g/dL — ABNORMAL LOW (ref 12.0–15.0)
Immature Granulocytes: 0 %
Lymphocytes Relative: 27 %
Lymphs Abs: 2.7 10*3/uL (ref 0.7–4.0)
MCH: 34.3 pg — ABNORMAL HIGH (ref 26.0–34.0)
MCHC: 33.4 g/dL (ref 30.0–36.0)
MCV: 102.6 fL — ABNORMAL HIGH (ref 80.0–100.0)
Monocytes Absolute: 1.3 10*3/uL — ABNORMAL HIGH (ref 0.1–1.0)
Monocytes Relative: 13 %
Neutro Abs: 5.7 10*3/uL (ref 1.7–7.7)
Neutrophils Relative %: 58 %
Platelet Count: 193 10*3/uL (ref 150–400)
RBC: 3.47 MIL/uL — ABNORMAL LOW (ref 3.87–5.11)
RDW: 15.9 % — ABNORMAL HIGH (ref 11.5–15.5)
WBC Count: 9.9 10*3/uL (ref 4.0–10.5)
nRBC: 0 % (ref 0.0–0.2)

## 2022-10-11 LAB — TSH: TSH: 5.435 u[IU]/mL — ABNORMAL HIGH (ref 0.350–4.500)

## 2022-10-11 LAB — CEA (ACCESS): CEA (CHCC): 5.51 ng/mL — ABNORMAL HIGH (ref 0.00–5.00)

## 2022-10-11 NOTE — Progress Notes (Signed)
[facility] OFFICE PROGRESS NOTE   Diagnosis: Colon cancer  INTERVAL HISTORY:   Ms. Lorenz returns as scheduled.  She is here with her daughter.  Her daughter reports Ms. Breitenstein has been diagnosed with vascular dementia.  She now lives in assisted living.  No difficulty with bowel function.  No bleeding.  Mouth dryness and hand skin erythema/thickening have improved.  She continues to have arthritis in the hands.  She participates in exercise classes.  Objective:  Vital signs in last 24 hours:  Blood pressure 136/82, pulse 92, temperature 98.1 F (36.7 C), temperature source Oral, resp. rate 18, height 5\' 2"  (1.575 m), weight 126 lb (57.2 kg), SpO2 96%.    HEENT: The mucous membranes are moist, no thrush Lymphatics: No cervical, supraclavicular, axillary, or inguinal nodes Resp: Scattered end inspiratory rales at the bilateral posterior chest, no respiratory distress, good air movement bilaterally Cardio: Irregular GI: No mass, nontender, no hepatosplenomegaly Vascular: The right lower leg is slightly larger than the left side, support stockings in place  Skin: Palms without erythema or skin thickening   Lab Results:  Lab Results  Component Value Date   WBC 9.9 10/11/2022   HGB 11.9 (L) 10/11/2022   HCT 35.6 (L) 10/11/2022   MCV 102.6 (H) 10/11/2022   PLT 193 10/11/2022   NEUTROABS 5.7 10/11/2022    CMP  Lab Results  Component Value Date   NA 137 08/06/2022   K 4.1 08/06/2022   CL 106 08/06/2022   CO2 23 08/06/2022   GLUCOSE 106 (H) 08/06/2022   BUN 19 08/06/2022   CREATININE 0.76 08/06/2022   CALCIUM 9.4 08/06/2022   PROT 5.8 (L) 03/07/2022   ALBUMIN 3.1 (L) 03/07/2022   AST 22 03/07/2022   ALT 16 03/07/2022   ALKPHOS 66 03/07/2022   BILITOT 0.7 03/07/2022   GFRNONAA >60 08/06/2022   GFRAA 79.35 02/02/2021    Lab Results  Component Value Date   CEA1 8.2 (H) 03/07/2022   CEA 4.39 06/05/2022      Medications: I have reviewed the  patient's current medications.   Assessment/Plan: Colon cancer-splenic flexure mass on colonoscopy 02/22/2021 Biopsy 02/22/2021-invasive well differentiated adenocarcinoma, loss of MLH1 and PMS2 expression, MSI high, MLH1 hyper methylation present CTs 03/31/2021-no evidence of metastatic disease, possible mass at the splenic flexure, 4 mm left upper lung nodule, bilateral lung scarring Cycle 1 pembrolizumab 06/13/2021 Cycle 2 pembrolizumab 07/04/2021 Cycle 3 pembrolizumab 07/25/2021 Cycle 4 pembrolizumab 08/22/2021 Colonoscopy 09/20/2021-no residual mass at the splenic flexure/descending colon CT abdomen/pelvis 03/06/2022-diverticulosis without evidence of acute diverticulitis, partial colectomy Macrocytic anemia Sessile serrated polyp with focus of high-grade dysplasia on colonoscopy 02/22/2021 Colon cancer 2006, status post a right colectomy-"node positive "cancer per patient, received adjuvant chemotherapy Atrial fibrillation, maintained on anticoagulation History of a CVA and TIA Mild cognitive impairment Rheumatoid arthritis Hypothyroidism 10.  Chronic lung disease? 11.  Fall with head injury 10/22/2021 Scalp hematoma with bleeding 12/06/2021 12.  01/30/2022-displaced distal right fibular fracture, small displaced posterior malleolar fracture 13.  Admission 03/07/2022 with acute GI bleeding 14.  Fall with right distal fibular/malleolar fracture November 2023     Disposition: Ms. Kashani appears well.  Is no clinical evidence for progression of the colon cancer.  The hemoglobin is stable.  We will follow-up on the CEA and thyroid panel from today.  She will return for an office and lab visit in 4 months.  We are available to see her in the interim as needed.  Thornton Papas, MD  10/11/2022  2:48 PM

## 2022-10-12 DIAGNOSIS — R41841 Cognitive communication deficit: Secondary | ICD-10-CM | POA: Diagnosis not present

## 2022-10-12 DIAGNOSIS — F01A0 Vascular dementia, mild, without behavioral disturbance, psychotic disturbance, mood disturbance, and anxiety: Secondary | ICD-10-CM | POA: Diagnosis not present

## 2022-10-13 LAB — T4: T4, Total: 6.9 ug/dL (ref 4.5–12.0)

## 2022-10-15 ENCOUNTER — Telehealth: Payer: Self-pay

## 2022-10-15 NOTE — Telephone Encounter (Signed)
Patient gave verbal understanding and had no further questions or concerns  

## 2022-10-15 NOTE — Telephone Encounter (Signed)
-----   Message from Thornton Papas sent at 10/15/2022  8:57 AM EDT ----- Please call daughter, thyroid test is ok, CEA borderline elevated, will repeat next visit, f/u as scheduled

## 2022-10-16 DIAGNOSIS — R41841 Cognitive communication deficit: Secondary | ICD-10-CM | POA: Diagnosis not present

## 2022-10-16 DIAGNOSIS — F01A0 Vascular dementia, mild, without behavioral disturbance, psychotic disturbance, mood disturbance, and anxiety: Secondary | ICD-10-CM | POA: Diagnosis not present

## 2022-10-18 ENCOUNTER — Encounter: Payer: Self-pay | Admitting: Adult Health

## 2022-10-18 ENCOUNTER — Non-Acute Institutional Stay: Payer: Medicare Other | Admitting: Adult Health

## 2022-10-18 DIAGNOSIS — R531 Weakness: Secondary | ICD-10-CM | POA: Diagnosis not present

## 2022-10-18 DIAGNOSIS — R410 Disorientation, unspecified: Secondary | ICD-10-CM

## 2022-10-18 DIAGNOSIS — J849 Interstitial pulmonary disease, unspecified: Secondary | ICD-10-CM | POA: Diagnosis not present

## 2022-10-18 DIAGNOSIS — R195 Other fecal abnormalities: Secondary | ICD-10-CM | POA: Diagnosis not present

## 2022-10-18 DIAGNOSIS — F01A0 Vascular dementia, mild, without behavioral disturbance, psychotic disturbance, mood disturbance, and anxiety: Secondary | ICD-10-CM | POA: Diagnosis not present

## 2022-10-18 DIAGNOSIS — N390 Urinary tract infection, site not specified: Secondary | ICD-10-CM | POA: Diagnosis not present

## 2022-10-18 DIAGNOSIS — K562 Volvulus: Secondary | ICD-10-CM | POA: Diagnosis not present

## 2022-10-18 DIAGNOSIS — R41841 Cognitive communication deficit: Secondary | ICD-10-CM | POA: Diagnosis not present

## 2022-10-18 DIAGNOSIS — I517 Cardiomegaly: Secondary | ICD-10-CM | POA: Diagnosis not present

## 2022-10-18 LAB — BASIC METABOLIC PANEL
BUN: 25 — AB (ref 4–21)
CO2: 26 — AB (ref 13–22)
Chloride: 97 — AB (ref 99–108)
Creatinine: 1 (ref 0.5–1.1)
Glucose: 98
Potassium: 4.1 mEq/L (ref 3.5–5.1)
Sodium: 133 — AB (ref 137–147)

## 2022-10-18 LAB — HEPATIC FUNCTION PANEL
ALT: 16 U/L (ref 7–35)
AST: 17 (ref 13–35)
Alkaline Phosphatase: 90 (ref 25–125)
Bilirubin, Total: 0.6

## 2022-10-18 LAB — CBC: RBC: 3.56 — AB (ref 3.87–5.11)

## 2022-10-18 LAB — COMPREHENSIVE METABOLIC PANEL
Albumin: 4.4 (ref 3.5–5.0)
Calcium: 9.5 (ref 8.7–10.7)
eGFR: 58

## 2022-10-18 LAB — CBC AND DIFFERENTIAL
HCT: 36 (ref 36–46)
Hemoglobin: 12.2 (ref 12.0–16.0)
Neutrophils Absolute: 4.9
Platelets: 188 10*3/uL (ref 150–400)
WBC: 8.1

## 2022-10-18 NOTE — Progress Notes (Signed)
Location:  Medical illustrator of Service:  ALF (13) Provider:   Peggye Ley, ANP [facility] [phone]   Mahlon Gammon, MD  Patient Care Team: Mahlon Gammon, MD as PCP - General (Internal Medicine) Rennis Golden Lisette Abu, MD as PCP - Cardiology (Cardiology) Drema Dallas, DO as Consulting Physician (Neurology)  Extended Emergency Contact Information Primary Emergency Contact: [name],[name] Address: [address]          [place], [place] [address] Darden Amber of [place] Home Phone: [phone] Mobile Phone: [phone] Relation: Daughter Secondary Emergency Contact: Hollie Salk Address: [address]          [place], [place] [address] Darden Amber of [place] Mobile Phone: [phone] Relation: Son  Code Status:  DNR Goals of care: Advanced Directive information    10/11/2022    2:32 PM  Advanced Directives  Does Patient Have a Medical Advance Directive? Yes  Type of Estate agent of [place];Living will  Does patient want to make changes to medical advance directive? No - Patient declined  Copy of Healthcare Power of Attorney in Chart? Yes - validated most recent copy scanned in chart (See row information)     Chief Complaint  Patient presents with   Acute Visit    Confusion, weakness    HPI:  Pt is a 89 y.o. female seen today for an acute visit for confusion and weakness.   Ms. Morganti resides in the assisted living area of [facility]  PMH: hx of colon ca in 2006 with recurrence. Noted by colonoscopy at splenic flexure 02/22/21, bx showed well differentiated adenocarcinoma. CT in Jan of 2023 showed no residual disease. Had 4 cycles of pembrolizumab. Colonoscopy 09/20/21 showed no residual mass.    Also hx of RA on Arava, afib off eliquis due to bleeding risk, OAB, TIA, CVA, HTN, ILD, Gerd, GIB, Hypothyroidism, OA, B12 def, mitral and tricuspid valve repair, HLD, anemia, depression, constipation.   The nurse  reports she is more confused, weaker and slumping over on her couch. Upon entering the room she is slightly short of breath but this is not uncommon for her.  She is pale and has mild abd distention. Her CNA took her vitals and her 02 sat was 91%, BP 151/72, RR22 T 97.1,  and HR was variable 70-90s.  There were readings of the 40s which were erroneous.  There is one report of a dark stool. She does take iron.     Past Medical History:  Diagnosis Date   Allergy    seasonal   Anemia    Anxiety    Arthritis    Back   Asymptomatic menopausal state    Atrial fibrillation (HCC)    Cancer (HCC) 2006   Colon.  Basal Cell Skin cancer- right arm   Cataract    removed bilateral   Chronic atrial fibrillation (HCC)    Colon cancer (HCC)    Constipation due to pain medication therapy    after heart surgery   Deficiency of other specified B group vitamins    Diplopia    Disorientation, unspecified    Dysrhythmia    PAF   GERD (gastroesophageal reflux disease)    Heart murmur    Hyperlipidemia    Hypertension    Hypothyroidism    Malignant neoplasm of colon, unspecified (HCC)    Other specified diseases of blood and blood-forming organs    Other specified disorders of bone density and structure, unspecified forearm  Other thrombophilia (HCC)    per matrix   Overactive bladder    per Matrix   Personal history of other malignant neoplasm of large intestine    Presbycusis, bilateral    per matrix   RA (rheumatoid arthritis) (HCC)    Restless leg    Seizures (HCC)    after Heart Surgey   Stroke (HCC)    TIA- found by neurologist after    Valgus deformity, not elsewhere classified, right knee    per matrix   Vascular dementia, mild, without behavioral disturbance, psychotic disturbance, mood disturbance, and anxiety (HCC)    per matrix   Past Surgical History:  Procedure Laterality Date   ABDOMINAL HYSTERECTOMY  1970   Partial    COLON RESECTION  2006   cancer   COLON SURGERY      COLONOSCOPY     EYE SURGERY Bilateral    Cataract   MAXIMUM ACCESS (MAS)POSTERIOR LUMBAR INTERBODY FUSION (PLIF) 2 LEVEL N/A 04/12/2015   Procedure: Lumbar Three-Five Decompression, Pedicle Screw Fixation, and Posteriolateral Arthrodesis;  Surgeon: Maeola Harman, MD;  Location: [facility];  Service: Neurosurgery;  Laterality: N/A;  L3-4 L4-5 Maximum access posterior lumbar fusion, possible interbodies and resection of synovial cyst at L4-5   MITRAL VALVE REPAIR  01/20/2013   Gore-tex cords to P1, P2, and P3. Magic suture to posterior medial commisure, #30 Physio 1 ring. Done in [place]   TONSILLECTOMY     about 1940   TRICUSPID VALVE SURGERY  01/20/2013   #28 TriAd ring done in [place]    Allergies  Allergen Reactions   Claritin [Loratadine] Other (See Comments)    Listed on MAR Unknown reaction   Codeine Other (See Comments)    "just don't take it well"   Gabapentin Other (See Comments)    Dizziness    Molds & Smuts Other (See Comments)    Dust.  Reaction is not listed on MAR    Pollen Extract Swelling    Grass   Bee Venom Swelling and Rash   Wasp Venom Swelling and Rash    Outpatient Encounter Medications as of 10/18/2022  Medication Sig   acetaminophen (TYLENOL) 500 MG tablet Take 1,000 mg by mouth in the morning and at bedtime.   atorvastatin (LIPITOR) 20 MG tablet TAKE 1 TABLET DAILY   Cholecalciferol (VITAMIN D) 50 MCG (2000 UT) CAPS Take 2,000 Units by mouth in the morning.   cloNIDine (CATAPRES) 0.1 MG tablet Take 1 tablet (0.1 mg total) by mouth daily as needed. If SBP >180 only   digoxin (LANOXIN) 0.125 MG tablet TAKE 1 TABLET DAILY   Iron, Ferrous Sulfate, 325 (65 Fe) MG TABS Take 325 mg by mouth daily.   leflunomide (ARAVA) 20 MG tablet Take 20 mg by mouth in the morning.   levothyroxine (SYNTHROID) 100 MCG tablet Take 1 tablet (100 mcg total) by mouth daily before breakfast.   melatonin 3 MG TABS tablet Take 1 tablet (3 mg total) by mouth at bedtime as  needed.   metoprolol succinate (TOPROL-XL) 50 MG 24 hr tablet Take 1.5 tablets (75 mg total) by mouth daily. Take with or immediately following a meal.   mirtazapine (REMERON) 15 MG tablet TAKE 1 TABLET AT BEDTIME   Multiple Vitamin (MULTIVITAMIN ADULT PO) Take 1 tablet by mouth every morning.   Multiple Vitamins-Minerals (PRESERVISION AREDS 2) CAPS Take 1 capsule by mouth in the morning and at bedtime.   NON FORMULARY Fluid restriction 1800cc daily  1st- 900  cc 2nd- 700 cc 3rd- 200 cc   omeprazole (PRILOSEC) 40 MG capsule TAKE 1 CAPSULE DAILY   sodium chloride 1 g tablet Take 0.5 g by mouth daily.   valsartan (DIOVAN) 160 MG tablet Take 160 mg by mouth daily.   Vibegron (GEMTESA) 75 MG TABS Take 75 mg by mouth daily.   vitamin B-12 (CYANOCOBALAMIN) 1000 MCG tablet Take 1,000 mcg by mouth in the morning.   No facility-administered encounter medications on file as of 10/18/2022.    Review of Systems  Constitutional:  Positive for activity change and fatigue. Negative for appetite change, chills, diaphoresis, fever and unexpected weight change.  HENT:  Negative for congestion.   Respiratory:  Positive for shortness of breath. Negative for cough and wheezing.   Cardiovascular:  Negative for chest pain, palpitations and leg swelling.  Gastrointestinal:  Positive for abdominal distention. Negative for abdominal pain, constipation and diarrhea.       Dark stool  Genitourinary:  Negative for difficulty urinating and dysuria.  Musculoskeletal:  Positive for arthralgias and gait problem (uses walker). Negative for back pain, joint swelling and myalgias.  Neurological:  Positive for weakness. Negative for dizziness, tremors, seizures, syncope, facial asymmetry, speech difficulty, light-headedness, numbness and headaches.  Psychiatric/Behavioral:  Positive for confusion. Negative for agitation and behavioral problems.     Immunization History  Administered Date(s) Administered   Fluad Quad(high  Dose 65+) 01/05/2019, 12/07/2021   H1N1 04/07/2008   Influenza Whole 12/29/2008, 12/22/2009   Influenza, High Dose Seasonal PF 01/15/2012, 12/23/2014, 01/22/2020   Influenza,inj,Quad PF,6+ Mos 01/22/2014, 12/10/2016, 01/17/2018   Influenza,trivalent, recombinat, inj, PF 01/05/2016   Influenza-Unspecified 04/07/2008, 12/15/2014, 01/13/2021   Moderna Covid-19 Vaccine Bivalent Booster 39yrs & up 01/29/2022   Moderna Sars-Covid-2 Vaccination 04/07/2019, 05/05/2019, 02/09/2020   PFIZER(Purple Top)SARS-COV-2 Vaccination 01/06/2021   PPD Test 07/17/2010, 04/19/2015, 04/25/2015   Pfizer Covid-19 Vaccine Bivalent Booster 41yrs & up 07/19/2022   Pneumococcal Conjugate-13 11/10/2013   Pneumococcal Polysaccharide-23 12/27/2010, 12/05/2016   RSV,unspecified 03/28/2022   Tdap 04/15/2017   Zoster Recombinant(Shingrix) 04/09/2017, 06/18/2017   Pertinent  Health Maintenance Due  Topic Date Due   INFLUENZA VACCINE  10/25/2022   DEXA SCAN  Completed      03/22/2022   11:45 AM 05/21/2022    1:06 PM 06/06/2022    4:55 PM 07/09/2022    1:44 PM 08/29/2022    3:31 PM  Fall Risk  Falls in the past year? 1 1 1  0 1  Was there an injury with Fall? 1 1 1  0 1  Fall Risk Category Calculator 3 3 3  0 2  Fall Risk Category (Retired) High      Patient at Risk for Falls Due to History of fall(s) History of fall(s);Impaired balance/gait;Impaired mobility History of fall(s);Impaired balance/gait;Impaired mobility History of fall(s);Impaired balance/gait;Impaired mobility   Fall risk Follow up Falls evaluation completed Falls evaluation completed Falls evaluation completed;Education provided;Falls prevention discussed Falls evaluation completed Falls evaluation completed   Functional Status Survey:    Vitals:   10/18/22 1757  BP: (!) 151/72  Pulse: 93  Resp: (!) 22  Temp: (!) 97.1 F (36.2 C)  SpO2: 91%   There is no height or weight on file to calculate BMI. Physical Exam Vitals and nursing note reviewed.   Constitutional:      General: She is not in acute distress.    Appearance: She is not diaphoretic.  HENT:     Head: Normocephalic and atraumatic.  Neck:     Vascular: No  JVD.  Cardiovascular:     Rate and Rhythm: Normal rate. Rhythm irregular.     Heart sounds: Murmur heard.  Pulmonary:     Effort: No respiratory distress.     Breath sounds: Normal breath sounds. No wheezing.     Comments: Slight increased work of breathing  Abdominal:     General: Bowel sounds are normal. There is distension (mild).     Palpations: Abdomen is soft. There is no mass.     Tenderness: There is no abdominal tenderness. There is no guarding.  Skin:    General: Skin is warm and dry.     Coloration: Skin is pale.  Neurological:     Mental Status: She is alert.     Comments: Alert oriented to self. Attention is poor. Difficulty having a conversation. No focal deficit. Had difficulty following commands and answering questions. Generally weak.   Psychiatric:        Mood and Affect: Mood normal.     Labs reviewed: Recent Labs    12/07/21 0548 12/20/21 2238 01/30/22 1809 02/03/22 0000 03/10/22 0820 03/15/22 0000 06/22/22 1619 08/06/22 1327  NA 137   < > 132*   < > 131* 132* 136 137  K 4.2   < > 4.3   < > 4.2 4.7 4.7 4.1  CL 104   < > 100   < > 98 100 99 106  CO2 23   < > 22   < > 26 23* 22 23  GLUCOSE 90   < > 116*   < > 97  --  100* 106*  BUN 15   < > 22   < > 26* 16 18 19   CREATININE 0.79   < > 0.88   < > 0.75 0.7 0.72 0.76  CALCIUM 9.1   < > 9.4   < > 8.9 8.8 9.5 9.4  MG 1.6*  --  1.9  --   --   --   --   --    < > = values in this interval not displayed.   Recent Labs    03/06/22 1306 03/07/22 0100  AST 18 22  ALT 17 16  ALKPHOS 76 66  BILITOT 0.6 0.7  PROT 6.6 5.8*  ALBUMIN 3.8 3.1*   Recent Labs    04/17/22 1128 06/05/22 1432 08/06/22 1327 10/11/22 1405  WBC 7.5 8.9 7.6 9.9  NEUTROABS 4.8 5.8  --  5.7  HGB 11.3* 11.4* 11.7* 11.9*  HCT 34.3* 33.9* 35.2* 35.6*  MCV  100.9* 100.6* 100.9* 102.6*  PLT 186 187 170 193   Lab Results  Component Value Date   TSH 5.435 (H) 10/11/2022   Lab Results  Component Value Date   HGBA1C 5.5 01/12/2013   Lab Results  Component Value Date   CHOL 115 02/02/2021   HDL 48 02/02/2021   LDLCALC 49 02/02/2021   TRIG 91 02/02/2021    Significant Diagnostic Results in last 30 days:  No results found.  Assessment/Plan  1. Confusion Move from AL to skilled care for more monitoring and support ?infection or electrolyte imbalance CXR, CBC CMP, and  UA C and S  2. Weakness Generally weaker See above   3. Dark stools With mild abd distention Currently on iron Will check for anemia and hemoccult stool Also KUB F/U in am  Family/ staff Communication: Discussed with Patric Dykes her daughter. I went over the risk and benefit of utilizing the ER for an evaluation vs the  acute rehab here at [facility]. Through shared decision making we decided that providing care in rehab would be best as she is aging and having more health problems. She has a DNR in place and her goals of care are more focused at this point on her quality of life.    Labs/tests ordered:  CBC CMP CXR UA C and S KUB   Recommend apical pulse to nursing staff when checking vitals for accurate readings  Total time :  time greater than 50% of total time spent doing pt counseling and coordination of care

## 2022-10-19 ENCOUNTER — Non-Acute Institutional Stay (SKILLED_NURSING_FACILITY): Payer: Medicare Other | Admitting: Adult Health

## 2022-10-19 ENCOUNTER — Encounter: Payer: Self-pay | Admitting: Adult Health

## 2022-10-19 DIAGNOSIS — I10 Essential (primary) hypertension: Secondary | ICD-10-CM | POA: Diagnosis not present

## 2022-10-19 DIAGNOSIS — K5901 Slow transit constipation: Secondary | ICD-10-CM | POA: Diagnosis not present

## 2022-10-19 DIAGNOSIS — F01A0 Vascular dementia, mild, without behavioral disturbance, psychotic disturbance, mood disturbance, and anxiety: Secondary | ICD-10-CM

## 2022-10-19 DIAGNOSIS — R8281 Pyuria: Secondary | ICD-10-CM

## 2022-10-19 DIAGNOSIS — I4821 Permanent atrial fibrillation: Secondary | ICD-10-CM | POA: Diagnosis not present

## 2022-10-19 DIAGNOSIS — R410 Disorientation, unspecified: Secondary | ICD-10-CM

## 2022-10-19 DIAGNOSIS — R0602 Shortness of breath: Secondary | ICD-10-CM

## 2022-10-19 MED ORDER — POLYETHYLENE GLYCOL 3350 17 GM/SCOOP PO POWD: 17.0000 g | Freq: Every day | ORAL | Status: DC

## 2022-10-19 MED ORDER — CEPHALEXIN 500 MG PO CAPS: 500.0000 mg | ORAL_CAPSULE | Freq: Three times a day (TID) | ORAL | 0 refills | Status: AC

## 2022-10-19 NOTE — Progress Notes (Addendum)
Location:  [facility] Room Number: 156A Place of Service:  SNF [id number]) Provider:  Tamsen Roers, MD  Patient Care Team: Mahlon Gammon, MD as PCP - General (Internal Medicine) Rennis Golden Lisette Abu, MD as PCP - Cardiology (Cardiology) Drema Dallas, DO as Consulting Physician (Neurology)  Extended Emergency Contact Information Primary Emergency Contact: [name],[name] Address: [address]          [place], [place] [address] Darden Amber of [place] Home Phone: [phone] Mobile Phone: [phone] Relation: Daughter Secondary Emergency Contact: Hollie Salk Address: [address]          [place], [place] [address] Darden Amber of [place] Mobile Phone: [phone] Relation: Son  Code Status:  DNR Goals of care: Advanced Directive information    10/19/2022    9:21 AM  Advanced Directives  Does Patient Have a Medical Advance Directive? Yes  Type of Estate agent of [place];Living will;Out of facility DNR (pink MOST or yellow form)  Does patient want to make changes to medical advance directive? No - Patient declined  Copy of Healthcare Power of Attorney in Chart? Yes - validated most recent copy scanned in chart (See row information)     Chief Complaint  Patient presents with   Acute Visit    Patient is being seen for acute confusion    HPI:  Pt is a 89 y.o. female seen today for an acute visit for f/u regarding acute confusion  Ms. Sinicki resides in [place] but moved to the skilled rehab area due to weakness and confusion.   PMH: hx of colon ca in 2006 with recurrence. Noted by colonoscopy at [facility] 02/22/21, bx showed well differentiated adenocarcinoma. CT in Jan of 2023 showed no residual disease. Had 4 cycles of pembrolizumab. Colonoscopy 09/20/21 showed no residual mass.    Also hx of RA on Arava, afib off eliquis due to bleeding risk, OAB, TIA, CVA, HTN, ILD, Gerd, GIB, Hypothyroidism, OA, B12  def, mitral and tricuspid valve repair, HLD, anemia, depression, constipation.   Note from 10/18/22 The nurse reports she is more confused, weaker and slumping over on her couch. Upon entering the room she is slightly short of breath but this is not uncommon for her.  She is pale and has mild abd distention. Her CNA took her vitals and her 02 sat was 91%, BP 151/72, RR22 T 97.1,  and HR was variable 70-90s.  There were readings of the 40s which were erroneous.  There is one report of a dark stool. She does take iron.   Update: 10/19/22  The nurse reports she slept well over night and was more alert and oriented this morning.  She was able to walk independently but needed reminders and cuing. She has a slow response to questions, trouble with word findings. Also continued with mild intermittent sob. Sats range 89-98%.  No cough or congestion.No urinary symptoms. Pulse controlled. BP elevated on first check systolic in the 180s, came down to 148/72 with prn clonidine. Findings of labs/test below reviewed  UA: Positive Nitrite, WBC>50 RBC 0 Bacteria occasional, Leukocyte esterase 500 Protein 20 Spec gravity 1.018 KUB: No bowel obstruction, moderate stool in the colon compatible with constipation CXR: no radiographic evidence of acute CP disease, mild CM, mild ostepenia and Mild OA. Does indicate mild chronic interstitial lung disease.  CBC and CMP reviewed and no acute changes from prior numbers.   Hemoccult pending Past Medical History:  Diagnosis Date  Allergy    seasonal   Anemia    Anxiety    Arthritis    Back   Asymptomatic menopausal state    Atrial fibrillation (HCC)    Cancer (HCC) 2006   Colon.  Basal Cell Skin cancer- right arm   Cataract    removed bilateral   Chronic atrial fibrillation (HCC)    Colon cancer (HCC)    Constipation due to pain medication therapy    after heart surgery   Deficiency of other specified B group vitamins    Diplopia    Disorientation, unspecified     Dysrhythmia    PAF   GERD (gastroesophageal reflux disease)    Heart murmur    Hyperlipidemia    Hypertension    Hypothyroidism    Malignant neoplasm of colon, unspecified (HCC)    Other specified diseases of blood and blood-forming organs    Other specified disorders of bone density and structure, unspecified forearm    Other thrombophilia (HCC)    per matrix   Overactive bladder    per Matrix   Personal history of other malignant neoplasm of large intestine    Presbycusis, bilateral    per matrix   RA (rheumatoid arthritis) (HCC)    Restless leg    Seizures (HCC)    after Heart Surgey   Stroke (HCC)    TIA- found by neurologist after    Valgus deformity, not elsewhere classified, right knee    per matrix   Vascular dementia, mild, without behavioral disturbance, psychotic disturbance, mood disturbance, and anxiety (HCC)    per matrix   Past Surgical History:  Procedure Laterality Date   ABDOMINAL HYSTERECTOMY  1970   Partial    COLON RESECTION  2006   cancer   COLON SURGERY     COLONOSCOPY     EYE SURGERY Bilateral    Cataract   MAXIMUM ACCESS (MAS)POSTERIOR LUMBAR INTERBODY FUSION (PLIF) 2 LEVEL N/A 04/12/2015   Procedure: Lumbar Three-Five Decompression, Pedicle Screw Fixation, and Posteriolateral Arthrodesis;  Surgeon: Maeola Harman, MD;  Location: [facility];  Service: Neurosurgery;  Laterality: N/A;  L3-4 L4-5 Maximum access posterior lumbar fusion, possible interbodies and resection of synovial cyst at L4-5   MITRAL VALVE REPAIR  01/20/2013   Gore-tex cords to P1, P2, and P3. Magic suture to posterior medial commisure, #30 Physio 1 ring. Done in [place]   TONSILLECTOMY     about 1940   TRICUSPID VALVE SURGERY  01/20/2013   #28 TriAd ring done in [place]    Allergies  Allergen Reactions   Claritin [Loratadine] Other (See Comments)    Listed on MAR Unknown reaction   Codeine Other (See Comments)    "just don't take it well"   Gabapentin Other (See  Comments)    Dizziness    Molds & Smuts Other (See Comments)    Dust.  Reaction is not listed on MAR    Pollen Extract Swelling    Grass   Bee Venom Swelling and Rash   Wasp Venom Swelling and Rash    Outpatient Encounter Medications as of 10/19/2022  Medication Sig   acetaminophen (TYLENOL) 500 MG tablet Take 1,000 mg by mouth in the morning and at bedtime.   atorvastatin (LIPITOR) 20 MG tablet TAKE 1 TABLET DAILY   Cholecalciferol (VITAMIN D) 50 MCG (2000 UT) CAPS Take 2,000 Units by mouth in the morning.   cloNIDine (CATAPRES) 0.1 MG tablet Take 1 tablet (0.1 mg total) by mouth daily  as needed. If SBP >180 only   digoxin (LANOXIN) 0.125 MG tablet TAKE 1 TABLET DAILY   Iron, Ferrous Sulfate, 325 (65 Fe) MG TABS Take 325 mg by mouth daily.   leflunomide (ARAVA) 20 MG tablet Take 20 mg by mouth in the morning.   levalbuterol (XOPENEX) 0.63 MG/3ML nebulizer solution Take 0.63 mg by nebulization 2 (two) times daily as needed for wheezing or shortness of breath.   levothyroxine (SYNTHROID) 100 MCG tablet Take 1 tablet (100 mcg total) by mouth daily before breakfast.   metoprolol succinate (TOPROL-XL) 50 MG 24 hr tablet Take 1.5 tablets (75 mg total) by mouth daily. Take with or immediately following a meal.   mirtazapine (REMERON) 15 MG tablet TAKE 1 TABLET AT BEDTIME   Multiple Vitamin (MULTIVITAMIN ADULT PO) Take 1 tablet by mouth every morning.   Multiple Vitamins-Minerals (PRESERVISION AREDS 2) CAPS Take 1 capsule by mouth in the morning and at bedtime.   NON FORMULARY Fluid restriction 1800cc daily  1st- 900 cc 2nd- 700 cc 3rd- 200 cc   omeprazole (PRILOSEC) 40 MG capsule TAKE 1 CAPSULE DAILY   sodium chloride 1 g tablet Take 0.5 g by mouth daily.   valsartan (DIOVAN) 160 MG tablet Take 160 mg by mouth daily.   Vibegron (GEMTESA) 75 MG TABS Take 75 mg by mouth daily.   vitamin B-12 (CYANOCOBALAMIN) 1000 MCG tablet Take 1,000 mcg by mouth in the morning.   melatonin 3 MG TABS  tablet Take 1 tablet (3 mg total) by mouth at bedtime as needed. (Patient not taking: Reported on 10/19/2022)   No facility-administered encounter medications on file as of 10/19/2022.    Review of Systems  Constitutional:  Positive for activity change. Negative for appetite change, chills, diaphoresis, fatigue, fever and unexpected weight change.  HENT:  Negative for congestion.   Respiratory:  Positive for shortness of breath. Negative for cough and wheezing.   Cardiovascular:  Negative for chest pain, palpitations and leg swelling.  Gastrointestinal:  Positive for abdominal distention and constipation. Negative for abdominal pain and diarrhea.  Genitourinary:  Negative for difficulty urinating and dysuria.  Musculoskeletal:  Positive for arthralgias and gait problem. Negative for back pain, joint swelling and myalgias.  Neurological:  Positive for weakness (general). Negative for dizziness, tremors, seizures, syncope, facial asymmetry, speech difficulty, light-headedness, numbness and headaches.  Psychiatric/Behavioral:  Positive for confusion. Negative for agitation and behavioral problems.     Immunization History  Administered Date(s) Administered   Fluad Quad(high Dose 65+) 01/05/2019, 12/07/2021   H1N1 04/07/2008   Influenza Whole 12/29/2008, 12/22/2009   Influenza, High Dose Seasonal PF 01/15/2012, 12/23/2014, 01/22/2020   Influenza,inj,Quad PF,6+ Mos 01/22/2014, 12/10/2016, 01/17/2018   Influenza,trivalent, recombinat, inj, PF 01/05/2016   Influenza-Unspecified 04/07/2008, 12/15/2014, 01/13/2021   Moderna Covid-19 Vaccine Bivalent Booster 89yrs & up 01/29/2022   Moderna Sars-Covid-2 Vaccination 04/07/2019, 05/05/2019, 02/09/2020   PFIZER(Purple Top)SARS-COV-2 Vaccination 01/06/2021   PPD Test 07/17/2010, 04/19/2015, 04/25/2015   Pfizer Covid-19 Vaccine Bivalent Booster 5yrs & up 07/19/2022   Pneumococcal Conjugate-13 11/10/2013   Pneumococcal Polysaccharide-23 12/27/2010,  12/05/2016   RSV,unspecified 03/28/2022   Tdap 04/15/2017   Zoster Recombinant(Shingrix) 04/09/2017, 06/18/2017   Pertinent  Health Maintenance Due  Topic Date Due   INFLUENZA VACCINE  10/25/2022   DEXA SCAN  Completed      03/22/2022   11:45 AM 05/21/2022    1:06 PM 06/06/2022    4:55 PM 07/09/2022    1:44 PM 08/29/2022    3:31 PM  Fall Risk  Falls in the past year? 1 1 1  0 1  Was there an injury with Fall? 1 1 1  0 1  Fall Risk Category Calculator 3 3 3  0 2  Fall Risk Category (Retired) High      Patient at Risk for Falls Due to History of fall(s) History of fall(s);Impaired balance/gait;Impaired mobility History of fall(s);Impaired balance/gait;Impaired mobility History of fall(s);Impaired balance/gait;Impaired mobility   Fall risk Follow up Falls evaluation completed Falls evaluation completed Falls evaluation completed;Education provided;Falls prevention discussed Falls evaluation completed Falls evaluation completed   Functional Status Survey:    Vitals:   10/19/22 0900  BP: (!) 148/72  Pulse: 98  Resp: 16  Temp: (!) 96.8 F (36 C)  TempSrc: Temporal  SpO2: 95%  Weight: 116 lb 9.6 oz (52.9 kg)  Height: 5\' 2"  (1.575 m)   Body mass index is 21.33 kg/m. Physical Exam Vitals and nursing note reviewed.  Constitutional:      General: She is not in acute distress.    Appearance: She is not diaphoretic.  HENT:     Head: Normocephalic and atraumatic.     Nose: Nose normal.     Mouth/Throat:     Mouth: Mucous membranes are moist.     Pharynx: Oropharynx is clear.  Eyes:     Conjunctiva/sclera: Conjunctivae normal.     Pupils: Pupils are equal, round, and reactive to light.  Neck:     Vascular: No JVD.  Cardiovascular:     Rate and Rhythm: Normal rate. Rhythm irregular.     Heart sounds: Murmur heard.  Pulmonary:     Effort: Pulmonary effort is normal. No respiratory distress.     Breath sounds: Rales (faint in both bases) present. No wheezing.  Abdominal:      General: There is distension.     Tenderness: There is no abdominal tenderness. There is no guarding.     Comments: Hypoactive x 4 Slightly firm  Musculoskeletal:     Right lower leg: No edema.     Left lower leg: No edema.  Skin:    General: Skin is warm and dry.  Neurological:     General: No focal deficit present.     Mental Status: She is alert. Mental status is at baseline.  Psychiatric:        Mood and Affect: Mood normal.     Labs reviewed: Recent Labs    12/07/21 0548 12/20/21 2238 01/30/22 1809 02/03/22 0000 03/10/22 0820 03/15/22 0000 06/22/22 1619 08/06/22 1327  NA 137   < > 132*   < > 131* 132* 136 137  K 4.2   < > 4.3   < > 4.2 4.7 4.7 4.1  CL 104   < > 100   < > 98 100 99 106  CO2 23   < > 22   < > 26 23* 22 23  GLUCOSE 90   < > 116*   < > 97  --  100* 106*  BUN 15   < > 22   < > 26* 16 18 19   CREATININE 0.79   < > 0.88   < > 0.75 0.7 0.72 0.76  CALCIUM 9.1   < > 9.4   < > 8.9 8.8 9.5 9.4  MG 1.6*  --  1.9  --   --   --   --   --    < > = values in this interval not displayed.   Recent Labs  03/06/22 1306 03/07/22 0100  AST 18 22  ALT 17 16  ALKPHOS 76 66  BILITOT 0.6 0.7  PROT 6.6 5.8*  ALBUMIN 3.8 3.1*   Recent Labs    04/17/22 1128 06/05/22 1432 08/06/22 1327 10/11/22 1405  WBC 7.5 8.9 7.6 9.9  NEUTROABS 4.8 5.8  --  5.7  HGB 11.3* 11.4* 11.7* 11.9*  HCT 34.3* 33.9* 35.2* 35.6*  MCV 100.9* 100.6* 100.9* 102.6*  PLT 186 187 170 193   Lab Results  Component Value Date   TSH 5.435 (H) 10/11/2022   Lab Results  Component Value Date   HGBA1C 5.5 01/12/2013   Lab Results  Component Value Date   CHOL 115 02/02/2021   HDL 48 02/02/2021   LDLCALC 49 02/02/2021   TRIG 91 02/02/2021    Significant Diagnostic Results in last 30 days:  No results found.  Assessment/Plan  1. Pyuria Recommend starting Keflex while waiting on culture. (This was a clean catch specimen)  2. Confusion No new electrolyte issues or anemia.  See  #5 Possible UTI as the cause  3. Slow transit constipation Miralax every day x 3 d then q 3 days  Has fecal incontinence and need to avoid too much laxative  4. Shortness of breath Chronic at this point.  ?if this is due to progression in interstitial lung disease and or cardiac origin (prior valvular repair) Not requiring oxygen at this time Does not appear fluid overloaded Keep cardiac f/u  5. Major neurocognitive disorder, due to vascular disease, without behavioral disturbance, mild (HCC) Seems to have had a progression over time with more disorientation and word finding issues. However, more acute presentation on 7/26 Followed by neurology   6. Permanent atrial fibrillation (HCC) Rate is controlled Off anticoagulation due to bleeding risk   7. HTN Improved after prn clonidine  Family/ staff Communication: left message with Patric Dykes, discussed with nurse.  Labs/tests ordered:  NA

## 2022-10-22 ENCOUNTER — Encounter: Payer: Self-pay | Admitting: Internal Medicine

## 2022-10-22 ENCOUNTER — Non-Acute Institutional Stay (SKILLED_NURSING_FACILITY): Payer: Medicare Other | Admitting: Internal Medicine

## 2022-10-22 DIAGNOSIS — N3000 Acute cystitis without hematuria: Secondary | ICD-10-CM

## 2022-10-22 DIAGNOSIS — I4821 Permanent atrial fibrillation: Secondary | ICD-10-CM

## 2022-10-22 DIAGNOSIS — R410 Disorientation, unspecified: Secondary | ICD-10-CM | POA: Diagnosis not present

## 2022-10-22 DIAGNOSIS — R195 Other fecal abnormalities: Secondary | ICD-10-CM | POA: Diagnosis not present

## 2022-10-22 DIAGNOSIS — Z85038 Personal history of other malignant neoplasm of large intestine: Secondary | ICD-10-CM

## 2022-10-22 DIAGNOSIS — D509 Iron deficiency anemia, unspecified: Secondary | ICD-10-CM

## 2022-10-22 DIAGNOSIS — I10 Essential (primary) hypertension: Secondary | ICD-10-CM

## 2022-10-22 DIAGNOSIS — R4189 Other symptoms and signs involving cognitive functions and awareness: Secondary | ICD-10-CM | POA: Diagnosis not present

## 2022-10-22 DIAGNOSIS — K5901 Slow transit constipation: Secondary | ICD-10-CM | POA: Diagnosis not present

## 2022-10-22 NOTE — Progress Notes (Unsigned)
Provider:   Location:  [facility] Room Number: 156A Place of Service:  SNF (31)  PCP: Mahlon Gammon, MD Patient Care Team: Mahlon Gammon, MD as PCP - General (Internal Medicine) Chrystie Nose, MD as PCP - Cardiology (Cardiology) Drema Dallas, DO as Consulting Physician (Neurology)  Extended Emergency Contact Information Primary Emergency Contact: [name],[name] Address: [address]          [place], [place] [address] Darden Amber of [place] Home Phone: [phone] Mobile Phone: [phone] Relation: Daughter Secondary Emergency Contact: Hollie Salk Address: [address]          [place], [place] [address] Darden Amber of [place] Mobile Phone: [phone] Relation: Son  Code Status: DNR Goals of Care: Advanced Directive information    10/22/2022    9:50 AM  Advanced Directives  Does Patient Have a Medical Advance Directive? Yes  Type of Estate agent of [place];Living will;Out of facility DNR (pink MOST or yellow form)  Does patient want to make changes to medical advance directive? No - Patient declined  Copy of Healthcare Power of Attorney in Chart? Yes - validated most recent copy scanned in chart (See row information)      Chief Complaint  Patient presents with   New Admit To SNF    Patient is being seen for New admission    HPI: Patient is a 89 y.o. female seen today for admission to Rehab  Lives in AL in [place]  She has h/o  A Fib Off Eliquis due to recurrent falls Hypertension and HLD H/o TIA  Cerebral Amyloid angiopathy on MRI Colon Cancer treated with Immunotherapy No Surgery in Remission  Anemia  Cognitive impairment Last MMSE in Neurology was 22/30 in 10/23 Rheumatoid arthritis on Arava  Worsening Dementia Urinary Incontinence  Seen on 07/25 for Weakness and Lethargy Transferred to Rehab  Urine Culture grew Klebsiella on Keflex now CBC,CMP were in Normal limits Stool Occult  negative KUB did show Constipation CXRAy was negative for acute changes  Patient is stable No fever or Dysuria Still feels weak and tired No Nausea or Abdominal pain Mental status still confused but more alert  Past Medical History:  Diagnosis Date   Allergy    seasonal   Anemia    Anxiety    Arthritis    Back   Asymptomatic menopausal state    Atrial fibrillation (HCC)    Cancer (HCC) 2006   Colon.  Basal Cell Skin cancer- right arm   Cataract    removed bilateral   Chronic atrial fibrillation (HCC)    Colon cancer (HCC)    Constipation due to pain medication therapy    after heart surgery   Deficiency of other specified B group vitamins    Diplopia    Disorientation, unspecified    Dysrhythmia    PAF   GERD (gastroesophageal reflux disease)    Heart murmur    Hyperlipidemia    Hypertension    Hypothyroidism    Malignant neoplasm of colon, unspecified (HCC)    Other specified diseases of blood and blood-forming organs    Other specified disorders of bone density and structure, unspecified forearm    Other thrombophilia (HCC)    per matrix   Overactive bladder    per Matrix   Personal history of other malignant neoplasm of large intestine    Presbycusis, bilateral    per matrix   RA (rheumatoid arthritis) (HCC)    Restless leg  Seizures (HCC)    after Heart Surgey   Stroke (HCC)    TIA- found by neurologist after    Valgus deformity, not elsewhere classified, right knee    per matrix   Vascular dementia, mild, without behavioral disturbance, psychotic disturbance, mood disturbance, and anxiety (HCC)    per matrix   Past Surgical History:  Procedure Laterality Date   ABDOMINAL HYSTERECTOMY  1970   Partial    COLON RESECTION  2006   cancer   COLON SURGERY     COLONOSCOPY     EYE SURGERY Bilateral    Cataract   MAXIMUM ACCESS (MAS)POSTERIOR LUMBAR INTERBODY FUSION (PLIF) 2 LEVEL N/A 04/12/2015   Procedure: Lumbar Three-Five Decompression, Pedicle  Screw Fixation, and Posteriolateral Arthrodesis;  Surgeon: Maeola Harman, MD;  Location: [facility];  Service: Neurosurgery;  Laterality: N/A;  L3-4 L4-5 Maximum access posterior lumbar fusion, possible interbodies and resection of synovial cyst at L4-5   MITRAL VALVE REPAIR  01/20/2013   Gore-tex cords to P1, P2, and P3. Magic suture to posterior medial commisure, #30 Physio 1 ring. Done in [place]   TONSILLECTOMY     about 1940   TRICUSPID VALVE SURGERY  01/20/2013   #28 TriAd ring done in [place]    reports that she has never smoked. She has been exposed to tobacco smoke. She has never used smokeless tobacco. She reports current alcohol use of about 1.0 standard drink of alcohol per week. She reports that she does not use drugs. Social History   Socioeconomic History   Marital status: Widowed    Spouse name: Not on file   Number of children: Not on file   Years of education: Not on file   Highest education level: Not on file  Occupational History   Occupation: elementary school teacher    Comment: retired   Tobacco Use   Smoking status: Never    Passive exposure: Past ("mother smoked")   Smokeless tobacco: Never  Vaping Use   Vaping status: Never Used  Substance and Sexual Activity   Alcohol use: Yes    Alcohol/week: 1.0 standard drink of alcohol    Types: 1 Glasses of wine per week    Comment: 1-2 per week   Drug use: No   Sexual activity: Not Currently  Other Topics Concern   Not on file  Social History Narrative   Social History      Diet? Healthy- low salt, sugar, fat      Do you drink/eat things with caffeine? On occasion      Marital status?        widow                        What year were you married? 1958      Do you live in a house, apartment, assisted living, condo, trailer, etc.? apartment      Is it one or more stories? one      How many persons live in your home? one      Do you have any pets in your home? (please list) no      Highest level of  education completed? 4 year college      Current or past profession: Insurance account manager      Do you exercise?             yes  Type & how often? Classes- senior retirement community/ some walking      Advanced Directives      Do you have a living will?      Do you have a DNR form?                                  If not, do you want to discuss one?      Do you have signed POA/HPOA for forms?       Functional Status Completed by: Daughter, Nicholos Johns      Do you have difficulty bathing or dressing yourself? no      Do you have difficulty preparing food or eating? no      Do you have difficulty managing your medications? no      Do you have difficulty managing your finances? no      Do you have difficulty affording your medications? no   Right handed   [facility]   Beautiful daughter    Social Determinants of Health   Financial Resource Strain: Low Risk  (01/28/2018)   Overall Financial Resource Strain (CARDIA)    Difficulty of Paying Living Expenses: Not hard at all  Food Insecurity: No Food Insecurity (03/08/2022)   Hunger Vital Sign    Worried About Running Out of Food in the Last Year: Never true    Ran Out of Food in the Last Year: Never true  Transportation Needs: No Transportation Needs (03/08/2022)   PRAPARE - Administrator, Civil Service (Medical): No    Lack of Transportation (Non-Medical): No  Physical Activity: Sufficiently Active (01/28/2018)   Exercise Vital Sign    Days of Exercise per Week: 6 days    Minutes of Exercise per Session: 30 min  Stress: No Stress Concern Present (01/28/2018)   [employer] of Occupational Health - Occupational Stress Questionnaire    Feeling of Stress : Only a little  Social Connections: Moderately Isolated (01/28/2018)   Social Connection and Isolation Panel [NHANES]    Frequency of Communication with Friends and Family: More than three times a week    Frequency of Social  Gatherings with Friends and Family: More than three times a week    Attends Religious Services: Never    Database administrator or Organizations: No    Attends Banker Meetings: Never    Marital Status: Widowed  Intimate Partner Violence: Not At Risk (03/08/2022)   Humiliation, Afraid, Rape, and Kick questionnaire    Fear of Current or Ex-Partner: No    Emotionally Abused: No    Physically Abused: No    Sexually Abused: No    Functional Status Survey:    Family History  Problem Relation Age of Onset   Lung disease Mother 56   Heart disease Father 48   Colon polyps Daughter    Alcohol abuse Son    Colon cancer Neg Hx    Stomach cancer Neg Hx    Rectal cancer Neg Hx    Crohn's disease Neg Hx    Esophageal cancer Neg Hx     Health Maintenance  Topic Date Due   COVID-19 Vaccine (7 - 2023-24 season) 11/04/2022 (Originally 09/13/2022)   INFLUENZA VACCINE  10/25/2022   DTaP/Tdap/Td (2 - Td or Tdap) 04/16/2027   Pneumonia Vaccine 52+ Years old  Completed   DEXA SCAN  Completed   Zoster Vaccines- Shingrix  Completed   HPV VACCINES  Aged Out    Allergies  Allergen Reactions   Claritin [Loratadine] Other (See Comments)    Listed on MAR Unknown reaction   Codeine Other (See Comments)    "just don't take it well"   Gabapentin Other (See Comments)    Dizziness    Molds & Smuts Other (See Comments)    Dust.  Reaction is not listed on MAR    Pollen Extract Swelling    Grass   Bee Venom Swelling and Rash   Wasp Venom Swelling and Rash    Outpatient Encounter Medications as of 10/22/2022  Medication Sig   acetaminophen (TYLENOL) 500 MG tablet Take 1,000 mg by mouth in the morning and at bedtime.   atorvastatin (LIPITOR) 20 MG tablet TAKE 1 TABLET DAILY   cephALEXin (KEFLEX) 500 MG capsule Take 1 capsule (500 mg total) by mouth 3 (three) times daily for 7 days.   Cholecalciferol (VITAMIN D) 50 MCG (2000 UT) CAPS Take 2,000 Units by mouth in the morning.    cloNIDine (CATAPRES) 0.1 MG tablet Take 1 tablet (0.1 mg total) by mouth daily as needed. If SBP >180 only   digoxin (LANOXIN) 0.125 MG tablet TAKE 1 TABLET DAILY   Iron, Ferrous Sulfate, 325 (65 Fe) MG TABS Take 325 mg by mouth daily.   leflunomide (ARAVA) 20 MG tablet Take 20 mg by mouth in the morning.   levalbuterol (XOPENEX) 0.63 MG/3ML nebulizer solution Take 0.63 mg by nebulization 2 (two) times daily as needed for wheezing or shortness of breath.   levothyroxine (SYNTHROID) 100 MCG tablet Take 1 tablet (100 mcg total) by mouth daily before breakfast.   melatonin 3 MG TABS tablet Take 1 tablet (3 mg total) by mouth at bedtime as needed.   metoprolol succinate (TOPROL-XL) 50 MG 24 hr tablet Take 1.5 tablets (75 mg total) by mouth daily. Take with or immediately following a meal.   mirtazapine (REMERON) 15 MG tablet TAKE 1 TABLET AT BEDTIME   Multiple Vitamin (MULTIVITAMIN ADULT PO) Take 1 tablet by mouth every morning.   Multiple Vitamins-Minerals (PRESERVISION AREDS 2) CAPS Take 1 capsule by mouth in the morning and at bedtime.   NON FORMULARY Fluid restriction 1800cc daily  1st- 900 cc 2nd- 700 cc 3rd- 200 cc   omeprazole (PRILOSEC) 40 MG capsule TAKE 1 CAPSULE DAILY   polyethylene glycol powder (GLYCOLAX/MIRALAX) 17 GM/SCOOP powder Take 17 g by mouth daily. Try daily for 3 days then change to q 3 days   sodium chloride 1 g tablet Take 0.5 g by mouth daily.   valsartan (DIOVAN) 160 MG tablet Take 160 mg by mouth daily.   Vibegron (GEMTESA) 75 MG TABS Take 75 mg by mouth daily.   vitamin B-12 (CYANOCOBALAMIN) 1000 MCG tablet Take 1,000 mcg by mouth in the morning.   No facility-administered encounter medications on file as of 10/22/2022.    Review of Systems  Unable to perform ROS: Dementia    Vitals:   10/22/22 0948  BP: 133/76  Pulse: (!) 53  Resp: 16  Temp: (!) 97.2 F (36.2 C)  TempSrc: Temporal  SpO2: 96%  Weight: 116 lb 9.6 oz (52.9 kg)  Height: 5\' 2"  (1.575 m)    Body mass index is 21.33 kg/m. Physical Exam Vitals reviewed.  Constitutional:      Appearance: Normal appearance.  HENT:     Head: Normocephalic.     Nose: Nose normal.     Mouth/Throat:  Mouth: Mucous membranes are moist.     Pharynx: Oropharynx is clear.  Eyes:     Pupils: Pupils are equal, round, and reactive to light.  Cardiovascular:     Rate and Rhythm: Normal rate and regular rhythm.     Pulses: Normal pulses.     Heart sounds: Normal heart sounds. No murmur heard. Pulmonary:     Effort: Pulmonary effort is normal.     Breath sounds: Normal breath sounds.  Abdominal:     General: Abdomen is flat. Bowel sounds are normal.     Palpations: Abdomen is soft.  Musculoskeletal:        General: No swelling.     Cervical back: Neck supple.  Skin:    General: Skin is warm.  Neurological:     General: No focal deficit present.     Mental Status: She is alert.     Comments: Weak when she stood up with her walker  Psychiatric:        Mood and Affect: Mood normal.        Thought Content: Thought content normal.     Labs reviewed: Basic Metabolic Panel: Recent Labs    12/07/21 0548 12/20/21 2238 01/30/22 1809 02/03/22 0000 03/10/22 0820 03/15/22 0000 06/22/22 1619 08/06/22 1327 10/18/22 0000  NA 137   < > 132*   < > 131*   < > 136 137 133*  K 4.2   < > 4.3   < > 4.2   < > 4.7 4.1 4.1  CL 104   < > 100   < > 98   < > 99 106 97*  CO2 23   < > 22   < > 26   < > 22 23 26*  GLUCOSE 90   < > 116*   < > 97  --  100* 106*  --   BUN 15   < > 22   < > 26*   < > 18 19 25*  CREATININE 0.79   < > 0.88   < > 0.75   < > 0.72 0.76 1.0  CALCIUM 9.1   < > 9.4   < > 8.9   < > 9.5 9.4 9.5  MG 1.6*  --  1.9  --   --   --   --   --   --    < > = values in this interval not displayed.   Liver Function Tests: Recent Labs    03/06/22 1306 03/07/22 0100 10/18/22 0000  AST 18 22 17   ALT 17 16 16   ALKPHOS 76 66 90  BILITOT 0.6 0.7  --   PROT 6.6 5.8*  --   ALBUMIN 3.8  3.1* 4.4   No results for input(s): "LIPASE", "AMYLASE" in the last 8760 hours. No results for input(s): "AMMONIA" in the last 8760 hours. CBC: Recent Labs    06/05/22 1432 08/06/22 1327 10/11/22 1405 10/18/22 0000  WBC 8.9 7.6 9.9 8.1  NEUTROABS 5.8  --  5.7 4.90  HGB 11.4* 11.7* 11.9* 12.2  HCT 33.9* 35.2* 35.6* 36  MCV 100.6* 100.9* 102.6*  --   PLT 187 170 193 188   Cardiac Enzymes: No results for input(s): "CKTOTAL", "CKMB", "CKMBINDEX", "TROPONINI" in the last 8760 hours. BNP: Invalid input(s): "POCBNP" Lab Results  Component Value Date   HGBA1C 5.5 01/12/2013   Lab Results  Component Value Date   TSH 5.435 (H) 10/11/2022   Lab Results  Component Value  Date   VITAMINB12 834 12/06/2021   Lab Results  Component Value Date   FOLATE 10.2 12/06/2021   Lab Results  Component Value Date   IRON 73 12/06/2021   TIBC 248 (L) 12/06/2021   FERRITIN 116 04/17/2022    Imaging and Procedures obtained prior to SNF admission: DG Tibia/Fibula Right  Result Date: 08/06/2022 CLINICAL DATA:  Right lower leg pain and swelling EXAM: RIGHT TIBIA AND FIBULA - 2 VIEW COMPARISON:  01/30/2022 FINDINGS: No acute fracture or dislocation. No aggressive osseous lesion. Normal alignment. Generalized osteopenia. Healed right lateral malleolar fracture. Tricompartmental osteoarthritis of the right knee most severe in the lateral femorotibial compartment. Small plantar calcaneal spur. Mild osteoarthritis of the talonavicular joint. Generalized soft tissue edema throughout the subcutaneous fat of the right lower leg. Peripheral vascular atherosclerotic disease. No radiopaque foreign body or soft tissue emphysema. IMPRESSION: 1. No acute osseous injury of the right tibia and fibula. 2. Generalized soft tissue edema throughout the subcutaneous fat of the right lower leg. Electronically Signed   By: Elige Ko M.D.   On: 08/06/2022 13:15   US Venous Img Lower Unilateral Right  Result Date:  08/06/2022 CLINICAL DATA:  Pain and swelling EXAM: Right LOWER EXTREMITY VENOUS DOPPLER ULTRASOUND TECHNIQUE: Gray-scale sonography with compression, as well as color and duplex ultrasound, were performed to evaluate the deep venous system(s) from the level of the common femoral vein through the popliteal and proximal calf veins. COMPARISON:  None Available. FINDINGS: VENOUS Normal compressibility of the common femoral, superficial femoral, and popliteal veins, as well as the visualized calf veins. Visualized portions of profunda femoral vein and great saphenous vein unremarkable. No filling defects to suggest DVT on grayscale or color Doppler imaging. Doppler waveforms show normal direction of venous flow, normal respiratory plasticity and response to augmentation. Limited views of the contralateral common femoral vein are unremarkable. OTHER There is edema in subcutaneous plane in right lower leg without any loculated fluid collections. Limitations: none IMPRESSION: There is no evidence of deep venous thrombosis in right lower extremity. Electronically Signed   By: Ernie Avena M.D.   On: 08/06/2022 13:01   DG Chest Portable 1 View  Result Date: 08/06/2022 CLINICAL DATA:  Right leg swelling and pain for a day. Recent ankle fracture EXAM: PORTABLE CHEST 1 VIEW COMPARISON:  Chest x-ray 03/07/2021 and older FINDINGS: Status post median sternotomy. Prosthetic cardiac valve. Borderline size heart. Apical pleural thickening with stable interstitial changes. No consolidation, pneumothorax or effusion. No edema. Osteopenia and degenerative changes. IMPRESSION: Postop chest. Borderline size heart with chronic appearing interstitial and pleural changes. Electronically Signed   By: Karen Kays M.D.   On: 08/06/2022 12:19    Assessment/Plan 1. Acute cystitis without hematuria On Keflex More alert today but per nurses her Mental status is not at baseline Will Continue to have her do Speech and PT /OT  2.  Confusion MMSE was 15/30 which is drop from her baseline Will continue to monitor can be due to her UTI  3. Slow transit constipation On Miralax  4. Permanent atrial fibrillation (HCC) Off Doca due to bleeding and fall risk   5. Essential hypertension Diovan and toprol  6. Dark stools Occult negative HGB stable Due to iron  7. Iron deficiency anemia, unspecified iron deficiency anemia type Contiue iron Hgb good  8  H/O colon cancer,  In remission 7 Hypnatremia On Sodium tabs and FR Will increase her Sodium to 1 gm every day Check BMP in 1 week  9 hypothyroid Slightly high in 07/24 10 HLD Statin   Family/ staff Communication: D/W the daughter  Labs/tests ordered: BMP and CBC in 1 week

## 2022-10-23 DIAGNOSIS — F01A0 Vascular dementia, mild, without behavioral disturbance, psychotic disturbance, mood disturbance, and anxiety: Secondary | ICD-10-CM | POA: Diagnosis not present

## 2022-10-23 DIAGNOSIS — R41841 Cognitive communication deficit: Secondary | ICD-10-CM | POA: Diagnosis not present

## 2022-10-24 DIAGNOSIS — R41841 Cognitive communication deficit: Secondary | ICD-10-CM | POA: Diagnosis not present

## 2022-10-24 DIAGNOSIS — F01A0 Vascular dementia, mild, without behavioral disturbance, psychotic disturbance, mood disturbance, and anxiety: Secondary | ICD-10-CM | POA: Diagnosis not present

## 2022-10-25 DIAGNOSIS — R41841 Cognitive communication deficit: Secondary | ICD-10-CM | POA: Diagnosis not present

## 2022-10-26 DIAGNOSIS — R41841 Cognitive communication deficit: Secondary | ICD-10-CM | POA: Diagnosis not present

## 2022-10-26 DIAGNOSIS — N390 Urinary tract infection, site not specified: Secondary | ICD-10-CM | POA: Diagnosis not present

## 2022-10-26 DIAGNOSIS — R2681 Unsteadiness on feet: Secondary | ICD-10-CM | POA: Diagnosis not present

## 2022-10-26 DIAGNOSIS — M6389 Disorders of muscle in diseases classified elsewhere, multiple sites: Secondary | ICD-10-CM | POA: Diagnosis not present

## 2022-10-26 DIAGNOSIS — R278 Other lack of coordination: Secondary | ICD-10-CM | POA: Diagnosis not present

## 2022-10-29 DIAGNOSIS — R2681 Unsteadiness on feet: Secondary | ICD-10-CM | POA: Diagnosis not present

## 2022-10-29 DIAGNOSIS — M6389 Disorders of muscle in diseases classified elsewhere, multiple sites: Secondary | ICD-10-CM | POA: Diagnosis not present

## 2022-10-29 DIAGNOSIS — N390 Urinary tract infection, site not specified: Secondary | ICD-10-CM | POA: Diagnosis not present

## 2022-10-29 DIAGNOSIS — R41841 Cognitive communication deficit: Secondary | ICD-10-CM | POA: Diagnosis not present

## 2022-10-29 DIAGNOSIS — R278 Other lack of coordination: Secondary | ICD-10-CM | POA: Diagnosis not present

## 2022-10-30 DIAGNOSIS — R41841 Cognitive communication deficit: Secondary | ICD-10-CM | POA: Diagnosis not present

## 2022-10-30 DIAGNOSIS — R278 Other lack of coordination: Secondary | ICD-10-CM | POA: Diagnosis not present

## 2022-10-30 DIAGNOSIS — N390 Urinary tract infection, site not specified: Secondary | ICD-10-CM | POA: Diagnosis not present

## 2022-10-30 DIAGNOSIS — R2681 Unsteadiness on feet: Secondary | ICD-10-CM | POA: Diagnosis not present

## 2022-10-30 DIAGNOSIS — M6389 Disorders of muscle in diseases classified elsewhere, multiple sites: Secondary | ICD-10-CM | POA: Diagnosis not present

## 2022-10-30 DIAGNOSIS — D649 Anemia, unspecified: Secondary | ICD-10-CM | POA: Diagnosis not present

## 2022-10-30 LAB — CBC: RBC: 3.17 — AB (ref 3.87–5.11)

## 2022-10-30 LAB — BASIC METABOLIC PANEL
BUN: 18 (ref 4–21)
CO2: 18 (ref 13–22)
Chloride: 104 (ref 99–108)
Creatinine: 0.8 (ref 0.5–1.1)
Glucose: 84
Potassium: 4.4 mEq/L (ref 3.5–5.1)
Sodium: 137 (ref 137–147)

## 2022-10-30 LAB — CBC AND DIFFERENTIAL
HCT: 33 — AB (ref 36–46)
Hemoglobin: 11.2 — AB (ref 12.0–16.0)
Platelets: 177 10*3/uL (ref 150–400)
WBC: 7.5

## 2022-10-30 LAB — COMPREHENSIVE METABOLIC PANEL: Calcium: 8.6 — AB (ref 8.7–10.7)

## 2022-10-31 DIAGNOSIS — R2681 Unsteadiness on feet: Secondary | ICD-10-CM | POA: Diagnosis not present

## 2022-10-31 DIAGNOSIS — M6389 Disorders of muscle in diseases classified elsewhere, multiple sites: Secondary | ICD-10-CM | POA: Diagnosis not present

## 2022-10-31 DIAGNOSIS — R41841 Cognitive communication deficit: Secondary | ICD-10-CM | POA: Diagnosis not present

## 2022-10-31 DIAGNOSIS — R278 Other lack of coordination: Secondary | ICD-10-CM | POA: Diagnosis not present

## 2022-10-31 DIAGNOSIS — N390 Urinary tract infection, site not specified: Secondary | ICD-10-CM | POA: Diagnosis not present

## 2022-11-01 DIAGNOSIS — R2681 Unsteadiness on feet: Secondary | ICD-10-CM | POA: Diagnosis not present

## 2022-11-01 DIAGNOSIS — R278 Other lack of coordination: Secondary | ICD-10-CM | POA: Diagnosis not present

## 2022-11-01 DIAGNOSIS — M6389 Disorders of muscle in diseases classified elsewhere, multiple sites: Secondary | ICD-10-CM | POA: Diagnosis not present

## 2022-11-01 DIAGNOSIS — N390 Urinary tract infection, site not specified: Secondary | ICD-10-CM | POA: Diagnosis not present

## 2022-11-02 DIAGNOSIS — M6389 Disorders of muscle in diseases classified elsewhere, multiple sites: Secondary | ICD-10-CM | POA: Diagnosis not present

## 2022-11-02 DIAGNOSIS — R278 Other lack of coordination: Secondary | ICD-10-CM | POA: Diagnosis not present

## 2022-11-02 DIAGNOSIS — R2681 Unsteadiness on feet: Secondary | ICD-10-CM | POA: Diagnosis not present

## 2022-11-02 DIAGNOSIS — N390 Urinary tract infection, site not specified: Secondary | ICD-10-CM | POA: Diagnosis not present

## 2022-11-02 DIAGNOSIS — R41841 Cognitive communication deficit: Secondary | ICD-10-CM | POA: Diagnosis not present

## 2022-11-03 DIAGNOSIS — R41841 Cognitive communication deficit: Secondary | ICD-10-CM | POA: Diagnosis not present

## 2022-11-04 DIAGNOSIS — N390 Urinary tract infection, site not specified: Secondary | ICD-10-CM | POA: Diagnosis not present

## 2022-11-04 DIAGNOSIS — R278 Other lack of coordination: Secondary | ICD-10-CM | POA: Diagnosis not present

## 2022-11-04 DIAGNOSIS — M6389 Disorders of muscle in diseases classified elsewhere, multiple sites: Secondary | ICD-10-CM | POA: Diagnosis not present

## 2022-11-04 DIAGNOSIS — R2681 Unsteadiness on feet: Secondary | ICD-10-CM | POA: Diagnosis not present

## 2022-11-05 ENCOUNTER — Non-Acute Institutional Stay (SKILLED_NURSING_FACILITY): Payer: Medicare Other | Admitting: Internal Medicine

## 2022-11-05 ENCOUNTER — Encounter: Payer: Self-pay | Admitting: Internal Medicine

## 2022-11-05 DIAGNOSIS — I509 Heart failure, unspecified: Secondary | ICD-10-CM | POA: Diagnosis not present

## 2022-11-05 DIAGNOSIS — R0902 Hypoxemia: Secondary | ICD-10-CM | POA: Diagnosis not present

## 2022-11-05 DIAGNOSIS — R059 Cough, unspecified: Secondary | ICD-10-CM | POA: Diagnosis not present

## 2022-11-05 DIAGNOSIS — I4821 Permanent atrial fibrillation: Secondary | ICD-10-CM | POA: Diagnosis not present

## 2022-11-05 DIAGNOSIS — R4189 Other symptoms and signs involving cognitive functions and awareness: Secondary | ICD-10-CM

## 2022-11-05 DIAGNOSIS — J9809 Other diseases of bronchus, not elsewhere classified: Secondary | ICD-10-CM | POA: Diagnosis not present

## 2022-11-05 DIAGNOSIS — J984 Other disorders of lung: Secondary | ICD-10-CM | POA: Diagnosis not present

## 2022-11-05 DIAGNOSIS — J189 Pneumonia, unspecified organism: Secondary | ICD-10-CM | POA: Diagnosis not present

## 2022-11-05 DIAGNOSIS — R0602 Shortness of breath: Secondary | ICD-10-CM | POA: Diagnosis not present

## 2022-11-05 DIAGNOSIS — R918 Other nonspecific abnormal finding of lung field: Secondary | ICD-10-CM | POA: Diagnosis not present

## 2022-11-05 LAB — CBC AND DIFFERENTIAL
HCT: 34 — AB (ref 36–46)
Hemoglobin: 11.6 — AB (ref 12.0–16.0)
Platelets: 169 10*3/uL (ref 150–400)
WBC: 8.4

## 2022-11-05 LAB — BASIC METABOLIC PANEL
BUN: 21 (ref 4–21)
CO2: 25 — AB (ref 13–22)
Chloride: 99 (ref 99–108)
Creatinine: 0.9 (ref 0.5–1.1)
Glucose: 101
Potassium: 4.5 mEq/L (ref 3.5–5.1)
Sodium: 133 — AB (ref 137–147)

## 2022-11-05 LAB — COMPREHENSIVE METABOLIC PANEL
Calcium: 9.2 (ref 8.7–10.7)
eGFR: 66

## 2022-11-05 LAB — CBC: RBC: 3.26 — AB (ref 3.87–5.11)

## 2022-11-05 NOTE — Progress Notes (Unsigned)
Location:  [facility] Room Number: 156A Place of Service:  SNF [phone]) Provider:  Mahlon Gammon, MD   Mahlon Gammon, MD  Patient Care Team: Mahlon Gammon, MD as PCP - General (Internal Medicine) Rennis Golden Lisette Abu, MD as PCP - Cardiology (Cardiology) Drema Dallas, DO as Consulting Physician (Neurology)  Extended Emergency Contact Information Primary Emergency Contact: [name],[name] Address: [address]          [place], [place] [address] Darden [place] of [place] Home Phone: [phone] Mobile Phone: [phone] Relation: Daughter Secondary Emergency Contact: Hollie Salk Address: [address]          [place], [place] [address] Darden [place] of [place] Mobile Phone: [phone] Relation: Son  Code Status:  DNR Goals of care: Advanced Directive information    11/05/2022   11:17 AM  Advanced Directives  Does Patient Have a Medical Advance Directive? Yes  Type of Estate agent of [place];Living will;Out of facility DNR (pink MOST or yellow form)  Does patient want to make changes to medical advance directive? No - Patient declined  Copy of Healthcare Power of Attorney in Chart? Yes - validated most recent copy scanned in chart (See row information)     Chief Complaint  Patient presents with   Acute Visit    Patient is being seen for a acute visit    Immunizations    Patient is due for the flu vaccine     HPI:  Pt is a 89 y.o. female seen today for an acute visit for SOB  Admitted in Rehab for Weakness and UTI and Just treated with Keflex Noticed by nirses to ba SOB  Covid rapid Negative No Cough or Chest pain or fever Mild SOB Does have some swellingin her legs But weight is stable  Stat Chest Xray done in facility showed ? Pneumonia with Chronic Changes   has h/o  A Fib Off Eliquis due to recurrent falls Hypertension and HLD H/o TIA  Cerebral Amyloid angiopathy on MRI Colon Cancer treated with  Immunotherapy No Surgery in Remission  Anemia  Cognitive impairment Last MMSE in Neurology was 22/30 in 10/23 Rheumatoid arthritis on Arava  Worsening Dementia Urinary Incontinence    Past Medical History:  Diagnosis Date   Allergy    seasonal   Anemia    Anxiety    Arthritis    Back   Asymptomatic menopausal state    Atrial fibrillation (HCC)    Cancer (HCC) 2006   Colon.  Basal Cell Skin cancer- right arm   Cataract    removed bilateral   Chronic atrial fibrillation (HCC)    Colon cancer (HCC)    Constipation due to pain medication therapy    after heart surgery   Deficiency of other specified B group vitamins    Diplopia    Disorientation, unspecified    Dysrhythmia    PAF   GERD (gastroesophageal reflux disease)    Heart murmur    Hyperlipidemia    Hypertension    Hypothyroidism    Malignant neoplasm of colon, unspecified (HCC)    Other specified diseases of blood and blood-forming organs    Other specified disorders of bone density and structure, unspecified forearm    Other thrombophilia (HCC)    per matrix   Overactive bladder    per Matrix   Personal history of other malignant neoplasm of large intestine    Presbycusis, bilateral    per matrix   RA (rheumatoid  arthritis) (HCC)    Restless leg    Seizures (HCC)    after Heart Surgey   Stroke (HCC)    TIA- found by neurologist after    Valgus deformity, not elsewhere classified, right knee    per matrix   Vascular dementia, mild, without behavioral disturbance, psychotic disturbance, mood disturbance, and anxiety (HCC)    per matrix   Past Surgical History:  Procedure Laterality Date   ABDOMINAL HYSTERECTOMY  1970   Partial    COLON RESECTION  2006   cancer   COLON SURGERY     COLONOSCOPY     EYE SURGERY Bilateral    Cataract   MAXIMUM ACCESS (MAS)POSTERIOR LUMBAR INTERBODY FUSION (PLIF) 2 LEVEL N/A 04/12/2015   Procedure: Lumbar Three-Five Decompression, Pedicle Screw Fixation, and  Posteriolateral Arthrodesis;  Surgeon: Maeola Harman, MD;  Location: [facility];  Service: Neurosurgery;  Laterality: N/A;  L3-4 L4-5 Maximum access posterior lumbar fusion, possible interbodies and resection of synovial cyst at L4-5   MITRAL VALVE REPAIR  01/20/2013   Gore-tex cords to P1, P2, and P3. Magic suture to posterior medial commisure, #30 Physio 1 ring. Done in [place]   TONSILLECTOMY     about 1940   TRICUSPID VALVE SURGERY  01/20/2013   #28 TriAd ring done in [place]    Allergies  Allergen Reactions   Claritin [Loratadine] Other (See Comments)    Listed on MAR Unknown reaction   Codeine Other (See Comments)    "just don't take it well"   Gabapentin Other (See Comments)    Dizziness    Molds & Smuts Other (See Comments)    Dust.  Reaction is not listed on MAR    Pollen Extract Swelling    Grass   Bee Venom Swelling and Rash   Wasp Venom Swelling and Rash    Outpatient Encounter Medications as of 11/05/2022  Medication Sig   acetaminophen (TYLENOL) 500 MG tablet Take 1,000 mg by mouth in the morning and at bedtime.   atorvastatin (LIPITOR) 20 MG tablet TAKE 1 TABLET DAILY   Cholecalciferol (VITAMIN D) 50 MCG (2000 UT) CAPS Take 2,000 Units by mouth in the morning.   cloNIDine (CATAPRES) 0.1 MG tablet Take 1 tablet (0.1 mg total) by mouth daily as needed. If SBP >180 only   digoxin (LANOXIN) 0.125 MG tablet TAKE 1 TABLET DAILY   Iron, Ferrous Sulfate, 325 (65 Fe) MG TABS Take 325 mg by mouth daily.   leflunomide (ARAVA) 20 MG tablet Take 20 mg by mouth in the morning.   levalbuterol (XOPENEX) 0.63 MG/3ML nebulizer solution Take 0.63 mg by nebulization 2 (two) times daily as needed for wheezing or shortness of breath.   levothyroxine (SYNTHROID) 100 MCG tablet Take 1 tablet (100 mcg total) by mouth daily before breakfast.   melatonin 3 MG TABS tablet Take 1 tablet (3 mg total) by mouth at bedtime as needed.   metoprolol succinate (TOPROL-XL) 50 MG 24 hr tablet Take 1.5  tablets (75 mg total) by mouth daily. Take with or immediately following a meal.   mirtazapine (REMERON) 15 MG tablet TAKE 1 TABLET AT BEDTIME   Multiple Vitamin (MULTIVITAMIN ADULT PO) Take 1 tablet by mouth every morning.   Multiple Vitamins-Minerals (PRESERVISION AREDS 2) CAPS Take 1 capsule by mouth in the morning and at bedtime.   NON FORMULARY Fluid restriction 2000cc daily  1st- 900 cc 2nd- 700 cc 3rd- 400 cc   omeprazole (PRILOSEC) 40 MG capsule TAKE 1 CAPSULE  DAILY   polyethylene glycol powder (GLYCOLAX/MIRALAX) 17 GM/SCOOP powder Take 17 g by mouth daily. Try daily for 3 days then change to q 3 days   sodium chloride 1 g tablet Take 1 g by mouth daily.   valsartan (DIOVAN) 160 MG tablet Take 160 mg by mouth daily.   Vibegron (GEMTESA) 75 MG TABS Take 75 mg by mouth daily.   vitamin B-12 (CYANOCOBALAMIN) 1000 MCG tablet Take 1,000 mcg by mouth in the morning.   No facility-administered encounter medications on file as of 11/05/2022.    Review of Systems  Constitutional:  Negative for activity change and appetite change.  HENT: Negative.    Respiratory:  Positive for shortness of breath. Negative for cough.   Cardiovascular:  Positive for leg swelling.  Gastrointestinal:  Negative for constipation.  Genitourinary: Negative.   Musculoskeletal:  Negative for arthralgias, gait problem and myalgias.  Skin: Negative.   Neurological:  Positive for weakness. Negative for dizziness.  Psychiatric/Behavioral:  Positive for confusion. Negative for dysphoric mood and sleep disturbance.     Immunization History  Administered Date(s) Administered   Fluad Quad(high Dose 65+) 01/05/2019, 12/07/2021   H1N1 04/07/2008   Influenza Whole 12/29/2008, 12/22/2009   Influenza, High Dose Seasonal PF 01/15/2012, 12/23/2014, 01/22/2020   Influenza,inj,Quad PF,6+ Mos 01/22/2014, 12/10/2016, 01/17/2018   Influenza,trivalent, recombinat, inj, PF 01/05/2016   Influenza-Unspecified 04/07/2008,  12/15/2014, 01/13/2021   Moderna Covid-19 Vaccine Bivalent Booster 9yrs & up 01/29/2022   Moderna Sars-Covid-2 Vaccination 04/07/2019, 05/05/2019, 02/09/2020   PFIZER(Purple Top)SARS-COV-2 Vaccination 01/06/2021   PPD Test 07/17/2010, 04/19/2015, 04/25/2015   Pfizer Covid-19 Vaccine Bivalent Booster 18yrs & up 07/19/2022   Pneumococcal Conjugate-13 11/10/2013   Pneumococcal Polysaccharide-23 12/27/2010, 12/05/2016   RSV,unspecified 03/28/2022   Tdap 04/15/2017   Zoster Recombinant(Shingrix) 04/09/2017, 06/18/2017   Pertinent  Health Maintenance Due  Topic Date Due   INFLUENZA VACCINE  10/25/2022   DEXA SCAN  Completed      03/22/2022   11:45 AM 05/21/2022    1:06 PM 06/06/2022    4:55 PM 07/09/2022    1:44 PM 08/29/2022    3:31 PM  Fall Risk  Falls in the past year? 1 1 1  0 1  Was there an injury with Fall? 1 1 1  0 1  Fall Risk Category Calculator 3 3 3  0 2  Fall Risk Category (Retired) High      Patient at Risk for Falls Due to History of fall(s) History of fall(s);Impaired balance/gait;Impaired mobility History of fall(s);Impaired balance/gait;Impaired mobility History of fall(s);Impaired balance/gait;Impaired mobility   Fall risk Follow up Falls evaluation completed Falls evaluation completed Falls evaluation completed;Education provided;Falls prevention discussed Falls evaluation completed Falls evaluation completed   Functional Status Survey:    Vitals:   11/05/22 1104  BP: 138/66  Pulse: 72  Resp: 12  Temp: 97.7 F (36.5 C)  TempSrc: Temporal  SpO2: 98%  Weight: 129 lb 9.6 oz (58.8 kg)  Height: 5\' 2"  (1.575 m)   Body mass index is 23.7 kg/m. Physical Exam Vitals reviewed.  Constitutional:      Appearance: Normal appearance.  HENT:     Head: Normocephalic.     Nose: Nose normal.     Mouth/Throat:     Mouth: Mucous membranes are moist.     Pharynx: Oropharynx is clear.  Eyes:     Pupils: Pupils are equal, round, and reactive to light.  Cardiovascular:      Rate and Rhythm: Normal rate. Rhythm irregular.  Pulses: Normal pulses.     Heart sounds: Normal heart sounds. No murmur heard. Pulmonary:     Effort: Pulmonary effort is normal.     Breath sounds: Normal breath sounds. No wheezing or rales.  Abdominal:     General: Abdomen is flat. Bowel sounds are normal.     Palpations: Abdomen is soft.  Musculoskeletal:        General: No swelling.     Cervical back: Neck supple.  Skin:    General: Skin is warm.  Neurological:     General: No focal deficit present.     Mental Status: She is alert and oriented to person, place, and time.  Psychiatric:        Mood and Affect: Mood normal.        Thought Content: Thought content normal.     Labs reviewed: Recent Labs    12/07/21 0548 12/20/21 2238 01/30/22 1809 02/03/22 0000 03/10/22 0820 03/15/22 0000 06/22/22 1619 08/06/22 1327 10/18/22 0000  NA 137   < > 132*   < > 131*   < > 136 137 133*  K 4.2   < > 4.3   < > 4.2   < > 4.7 4.1 4.1  CL 104   < > 100   < > 98   < > 99 106 97*  CO2 23   < > 22   < > 26   < > 22 23 26*  GLUCOSE 90   < > 116*   < > 97  --  100* 106*  --   BUN 15   < > 22   < > 26*   < > 18 19 25*  CREATININE 0.79   < > 0.88   < > 0.75   < > 0.72 0.76 1.0  CALCIUM 9.1   < > 9.4   < > 8.9   < > 9.5 9.4 9.5  MG 1.6*  --  1.9  --   --   --   --   --   --    < > = values in this interval not displayed.   Recent Labs    03/06/22 1306 03/07/22 0100 10/18/22 0000  AST 18 22 17   ALT 17 16 16   ALKPHOS 76 66 90  BILITOT 0.6 0.7  --   PROT 6.6 5.8*  --   ALBUMIN 3.8 3.1* 4.4   Recent Labs    06/05/22 1432 08/06/22 1327 10/11/22 1405 10/18/22 0000  WBC 8.9 7.6 9.9 8.1  NEUTROABS 5.8  --  5.7 4.90  HGB 11.4* 11.7* 11.9* 12.2  HCT 33.9* 35.2* 35.6* 36  MCV 100.6* 100.9* 102.6*  --   PLT 187 170 193 188   Lab Results  Component Value Date   TSH 5.435 (H) 10/11/2022   Lab Results  Component Value Date   HGBA1C 5.5 01/12/2013   Lab Results  Component  Value Date   CHOL 115 02/02/2021   HDL 48 02/02/2021   LDLCALC 49 02/02/2021   TRIG 91 02/02/2021    Significant Diagnostic Results in last 30 days:  No results found.  Assessment/Plan 1. SOB (shortness of breath) Not sure if the Chest xray findings show acute infiltrate  but clinically she is does not have any signs of Pneumonia No Cough Fever or Chest pain Her Covid PCR is pending Rapid Antigen was negative  Check CBC,BNP Addendum CBC showed no Change in her WBC BNP is slightly high 243  Her EKG showed A Fib at rate of 90 and this can be probably cause of her SOB Will Continue to monitor   2. Chronic lung disease Have Seen in Xray and CT scan before  3. Permanent atrial fibrillation (HCC) Rate on EKG showed 96 She is in A Fib  ? Can be causing her to be SOB No Doca due to her GI bleed and Falls   4. Cognitive impairment Contiues to need help with her ADLS MMSE was 15/30 which is drop from her baseline   Other issues Essential hypertension Diovan and toprol    Dark stools Occult negative HGB stable Due to iron   Iron deficiency anemia, unspecified iron deficiency anemia type Contiue iron Hgb good    H/O colon cancer,  In remission  Hypnatremia On Sodium tabs and FR  Will increase her Sodium to 1 gm every day FR of 2000    hypothyroid Slightly high in 07/24 HLD Statin    Family/ staff Communication:   Labs/tests ordered:

## 2022-11-06 DIAGNOSIS — R2681 Unsteadiness on feet: Secondary | ICD-10-CM | POA: Diagnosis not present

## 2022-11-06 DIAGNOSIS — M6389 Disorders of muscle in diseases classified elsewhere, multiple sites: Secondary | ICD-10-CM | POA: Diagnosis not present

## 2022-11-06 DIAGNOSIS — R278 Other lack of coordination: Secondary | ICD-10-CM | POA: Diagnosis not present

## 2022-11-06 DIAGNOSIS — N390 Urinary tract infection, site not specified: Secondary | ICD-10-CM | POA: Diagnosis not present

## 2022-11-07 DIAGNOSIS — N390 Urinary tract infection, site not specified: Secondary | ICD-10-CM | POA: Diagnosis not present

## 2022-11-07 DIAGNOSIS — M6389 Disorders of muscle in diseases classified elsewhere, multiple sites: Secondary | ICD-10-CM | POA: Diagnosis not present

## 2022-11-07 DIAGNOSIS — R278 Other lack of coordination: Secondary | ICD-10-CM | POA: Diagnosis not present

## 2022-11-07 DIAGNOSIS — R41841 Cognitive communication deficit: Secondary | ICD-10-CM | POA: Diagnosis not present

## 2022-11-07 DIAGNOSIS — R2681 Unsteadiness on feet: Secondary | ICD-10-CM | POA: Diagnosis not present

## 2022-11-08 DIAGNOSIS — N390 Urinary tract infection, site not specified: Secondary | ICD-10-CM | POA: Diagnosis not present

## 2022-11-08 DIAGNOSIS — R41841 Cognitive communication deficit: Secondary | ICD-10-CM | POA: Diagnosis not present

## 2022-11-08 DIAGNOSIS — R2681 Unsteadiness on feet: Secondary | ICD-10-CM | POA: Diagnosis not present

## 2022-11-08 DIAGNOSIS — M6389 Disorders of muscle in diseases classified elsewhere, multiple sites: Secondary | ICD-10-CM | POA: Diagnosis not present

## 2022-11-08 DIAGNOSIS — R278 Other lack of coordination: Secondary | ICD-10-CM | POA: Diagnosis not present

## 2022-11-09 DIAGNOSIS — R278 Other lack of coordination: Secondary | ICD-10-CM | POA: Diagnosis not present

## 2022-11-09 DIAGNOSIS — R41841 Cognitive communication deficit: Secondary | ICD-10-CM | POA: Diagnosis not present

## 2022-11-09 DIAGNOSIS — M6389 Disorders of muscle in diseases classified elsewhere, multiple sites: Secondary | ICD-10-CM | POA: Diagnosis not present

## 2022-11-09 DIAGNOSIS — N390 Urinary tract infection, site not specified: Secondary | ICD-10-CM | POA: Diagnosis not present

## 2022-11-09 DIAGNOSIS — R2681 Unsteadiness on feet: Secondary | ICD-10-CM | POA: Diagnosis not present

## 2022-11-12 DIAGNOSIS — N390 Urinary tract infection, site not specified: Secondary | ICD-10-CM | POA: Diagnosis not present

## 2022-11-12 DIAGNOSIS — M6389 Disorders of muscle in diseases classified elsewhere, multiple sites: Secondary | ICD-10-CM | POA: Diagnosis not present

## 2022-11-12 DIAGNOSIS — R2681 Unsteadiness on feet: Secondary | ICD-10-CM | POA: Diagnosis not present

## 2022-11-12 DIAGNOSIS — R278 Other lack of coordination: Secondary | ICD-10-CM | POA: Diagnosis not present

## 2022-11-12 DIAGNOSIS — R41841 Cognitive communication deficit: Secondary | ICD-10-CM | POA: Diagnosis not present

## 2022-11-13 DIAGNOSIS — R278 Other lack of coordination: Secondary | ICD-10-CM | POA: Diagnosis not present

## 2022-11-13 DIAGNOSIS — M6389 Disorders of muscle in diseases classified elsewhere, multiple sites: Secondary | ICD-10-CM | POA: Diagnosis not present

## 2022-11-13 DIAGNOSIS — R2681 Unsteadiness on feet: Secondary | ICD-10-CM | POA: Diagnosis not present

## 2022-11-13 DIAGNOSIS — N390 Urinary tract infection, site not specified: Secondary | ICD-10-CM | POA: Diagnosis not present

## 2022-11-14 DIAGNOSIS — R41841 Cognitive communication deficit: Secondary | ICD-10-CM | POA: Diagnosis not present

## 2022-11-14 DIAGNOSIS — R278 Other lack of coordination: Secondary | ICD-10-CM | POA: Diagnosis not present

## 2022-11-14 DIAGNOSIS — N390 Urinary tract infection, site not specified: Secondary | ICD-10-CM | POA: Diagnosis not present

## 2022-11-14 DIAGNOSIS — M6389 Disorders of muscle in diseases classified elsewhere, multiple sites: Secondary | ICD-10-CM | POA: Diagnosis not present

## 2022-11-14 DIAGNOSIS — R2681 Unsteadiness on feet: Secondary | ICD-10-CM | POA: Diagnosis not present

## 2022-11-15 DIAGNOSIS — R2681 Unsteadiness on feet: Secondary | ICD-10-CM | POA: Diagnosis not present

## 2022-11-15 DIAGNOSIS — R278 Other lack of coordination: Secondary | ICD-10-CM | POA: Diagnosis not present

## 2022-11-15 DIAGNOSIS — M6389 Disorders of muscle in diseases classified elsewhere, multiple sites: Secondary | ICD-10-CM | POA: Diagnosis not present

## 2022-11-15 DIAGNOSIS — N390 Urinary tract infection, site not specified: Secondary | ICD-10-CM | POA: Diagnosis not present

## 2022-11-16 DIAGNOSIS — R278 Other lack of coordination: Secondary | ICD-10-CM | POA: Diagnosis not present

## 2022-11-16 DIAGNOSIS — R2681 Unsteadiness on feet: Secondary | ICD-10-CM | POA: Diagnosis not present

## 2022-11-16 DIAGNOSIS — R41841 Cognitive communication deficit: Secondary | ICD-10-CM | POA: Diagnosis not present

## 2022-11-16 DIAGNOSIS — N390 Urinary tract infection, site not specified: Secondary | ICD-10-CM | POA: Diagnosis not present

## 2022-11-16 DIAGNOSIS — M6389 Disorders of muscle in diseases classified elsewhere, multiple sites: Secondary | ICD-10-CM | POA: Diagnosis not present

## 2022-11-19 DIAGNOSIS — M6389 Disorders of muscle in diseases classified elsewhere, multiple sites: Secondary | ICD-10-CM | POA: Diagnosis not present

## 2022-11-19 DIAGNOSIS — R41841 Cognitive communication deficit: Secondary | ICD-10-CM | POA: Diagnosis not present

## 2022-11-19 DIAGNOSIS — R278 Other lack of coordination: Secondary | ICD-10-CM | POA: Diagnosis not present

## 2022-11-19 DIAGNOSIS — R2681 Unsteadiness on feet: Secondary | ICD-10-CM | POA: Diagnosis not present

## 2022-11-19 DIAGNOSIS — N390 Urinary tract infection, site not specified: Secondary | ICD-10-CM | POA: Diagnosis not present

## 2022-11-20 DIAGNOSIS — R278 Other lack of coordination: Secondary | ICD-10-CM | POA: Diagnosis not present

## 2022-11-20 DIAGNOSIS — R41841 Cognitive communication deficit: Secondary | ICD-10-CM | POA: Diagnosis not present

## 2022-11-20 DIAGNOSIS — R2681 Unsteadiness on feet: Secondary | ICD-10-CM | POA: Diagnosis not present

## 2022-11-20 DIAGNOSIS — M6389 Disorders of muscle in diseases classified elsewhere, multiple sites: Secondary | ICD-10-CM | POA: Diagnosis not present

## 2022-11-20 DIAGNOSIS — N390 Urinary tract infection, site not specified: Secondary | ICD-10-CM | POA: Diagnosis not present

## 2022-11-21 DIAGNOSIS — N390 Urinary tract infection, site not specified: Secondary | ICD-10-CM | POA: Diagnosis not present

## 2022-11-21 DIAGNOSIS — M6389 Disorders of muscle in diseases classified elsewhere, multiple sites: Secondary | ICD-10-CM | POA: Diagnosis not present

## 2022-11-21 DIAGNOSIS — R2681 Unsteadiness on feet: Secondary | ICD-10-CM | POA: Diagnosis not present

## 2022-11-21 DIAGNOSIS — R278 Other lack of coordination: Secondary | ICD-10-CM | POA: Diagnosis not present

## 2022-11-22 DIAGNOSIS — M6389 Disorders of muscle in diseases classified elsewhere, multiple sites: Secondary | ICD-10-CM | POA: Diagnosis not present

## 2022-11-22 DIAGNOSIS — R278 Other lack of coordination: Secondary | ICD-10-CM | POA: Diagnosis not present

## 2022-11-22 DIAGNOSIS — R2681 Unsteadiness on feet: Secondary | ICD-10-CM | POA: Diagnosis not present

## 2022-11-22 DIAGNOSIS — R41841 Cognitive communication deficit: Secondary | ICD-10-CM | POA: Diagnosis not present

## 2022-11-22 DIAGNOSIS — N390 Urinary tract infection, site not specified: Secondary | ICD-10-CM | POA: Diagnosis not present

## 2022-11-23 DIAGNOSIS — N390 Urinary tract infection, site not specified: Secondary | ICD-10-CM | POA: Diagnosis not present

## 2022-11-23 DIAGNOSIS — R41841 Cognitive communication deficit: Secondary | ICD-10-CM | POA: Diagnosis not present

## 2022-11-23 DIAGNOSIS — M6389 Disorders of muscle in diseases classified elsewhere, multiple sites: Secondary | ICD-10-CM | POA: Diagnosis not present

## 2022-11-23 DIAGNOSIS — R2681 Unsteadiness on feet: Secondary | ICD-10-CM | POA: Diagnosis not present

## 2022-11-23 DIAGNOSIS — R278 Other lack of coordination: Secondary | ICD-10-CM | POA: Diagnosis not present

## 2022-11-24 DIAGNOSIS — M6389 Disorders of muscle in diseases classified elsewhere, multiple sites: Secondary | ICD-10-CM | POA: Diagnosis not present

## 2022-11-24 DIAGNOSIS — R2681 Unsteadiness on feet: Secondary | ICD-10-CM | POA: Diagnosis not present

## 2022-11-24 DIAGNOSIS — R278 Other lack of coordination: Secondary | ICD-10-CM | POA: Diagnosis not present

## 2022-11-24 DIAGNOSIS — N390 Urinary tract infection, site not specified: Secondary | ICD-10-CM | POA: Diagnosis not present

## 2022-11-26 DIAGNOSIS — R278 Other lack of coordination: Secondary | ICD-10-CM | POA: Diagnosis not present

## 2022-11-26 DIAGNOSIS — R41841 Cognitive communication deficit: Secondary | ICD-10-CM | POA: Diagnosis not present

## 2022-11-26 DIAGNOSIS — R2681 Unsteadiness on feet: Secondary | ICD-10-CM | POA: Diagnosis not present

## 2022-11-26 DIAGNOSIS — M6281 Muscle weakness (generalized): Secondary | ICD-10-CM | POA: Diagnosis not present

## 2022-11-26 DIAGNOSIS — N390 Urinary tract infection, site not specified: Secondary | ICD-10-CM | POA: Diagnosis not present

## 2022-11-27 ENCOUNTER — Encounter: Payer: Self-pay | Admitting: Orthopedic Surgery

## 2022-11-27 ENCOUNTER — Non-Acute Institutional Stay (SKILLED_NURSING_FACILITY): Payer: Medicare Other | Admitting: Orthopedic Surgery

## 2022-11-27 DIAGNOSIS — M6281 Muscle weakness (generalized): Secondary | ICD-10-CM | POA: Diagnosis not present

## 2022-11-27 DIAGNOSIS — R2681 Unsteadiness on feet: Secondary | ICD-10-CM | POA: Diagnosis not present

## 2022-11-27 DIAGNOSIS — Z85038 Personal history of other malignant neoplasm of large intestine: Secondary | ICD-10-CM

## 2022-11-27 DIAGNOSIS — E039 Hypothyroidism, unspecified: Secondary | ICD-10-CM | POA: Diagnosis not present

## 2022-11-27 DIAGNOSIS — R0602 Shortness of breath: Secondary | ICD-10-CM | POA: Diagnosis not present

## 2022-11-27 DIAGNOSIS — Z8673 Personal history of transient ischemic attack (TIA), and cerebral infarction without residual deficits: Secondary | ICD-10-CM

## 2022-11-27 DIAGNOSIS — R4189 Other symptoms and signs involving cognitive functions and awareness: Secondary | ICD-10-CM

## 2022-11-27 DIAGNOSIS — M06049 Rheumatoid arthritis without rheumatoid factor, unspecified hand: Secondary | ICD-10-CM | POA: Diagnosis not present

## 2022-11-27 DIAGNOSIS — I10 Essential (primary) hypertension: Secondary | ICD-10-CM

## 2022-11-27 DIAGNOSIS — R32 Unspecified urinary incontinence: Secondary | ICD-10-CM | POA: Diagnosis not present

## 2022-11-27 DIAGNOSIS — D509 Iron deficiency anemia, unspecified: Secondary | ICD-10-CM | POA: Diagnosis not present

## 2022-11-27 DIAGNOSIS — N390 Urinary tract infection, site not specified: Secondary | ICD-10-CM | POA: Diagnosis not present

## 2022-11-27 DIAGNOSIS — E871 Hypo-osmolality and hyponatremia: Secondary | ICD-10-CM | POA: Diagnosis not present

## 2022-11-27 DIAGNOSIS — R278 Other lack of coordination: Secondary | ICD-10-CM | POA: Diagnosis not present

## 2022-11-27 DIAGNOSIS — I4821 Permanent atrial fibrillation: Secondary | ICD-10-CM | POA: Diagnosis not present

## 2022-11-27 NOTE — Progress Notes (Signed)
[facility] Room Number: 156-A Place of Service:  SNF [phone]) Provider:  Hazle Nordmann, NP  PCP: Mahlon Gammon, MD  Patient Care Team: Mahlon Gammon, MD as PCP - General (Internal Medicine) Chrystie Nose, MD as PCP - Cardiology (Cardiology) Drema Dallas, DO as Consulting Physician (Neurology)  Extended Emergency Contact Information Primary Emergency Contact: [name] Address: [address]          [place], [place] [address] D[place] of [place] Home Phone: [phone] Mobile Phone: [phone] Relation: Daughter Secondary Emergency Contact: Hollie Salk Address: [address]          [place], [place] [address] D[place] of [place] Mobile Phone: [phone] Relation: Son  Code Status:  DNR Goals of care: Advanced Directive information    11/27/2022   12:09 PM  Advanced Directives  Does Patient Have a Medical Advance Directive? Yes  Type of Estate agent of [place];Living will;Out of facility DNR (pink MOST or yellow form)  Does patient want to make changes to medical advance directive? No - Patient declined  Copy of Healthcare Power of Attorney in Chart? Yes - validated most recent copy scanned in chart (See row information)     Chief Complaint  Patient presents with   Medical Management of Chronic Issues    HPI:  Pt is a 89 y.o. female seen today for medical management of chronic conditions.   She currently resides on the rehabilitation unit at [facility] PMH: afib, aortic atherosclerosis, HTN, carotid stenosis, tricuspid valve surgery 2014, TIA, neurocognitive disorder due to vascular disease,cerebral amyloid angiopathy,  interstitial lung disease, colon cancer, diverticulosis, GERD, GI Bleed, hypothyroidism, RA, osteopenia, OAB, anemia and depression.   07/25 admitted to rehab due to ongoing weakness and UTI> resolved with Keflex.   Shortness of breath- noted 08/12, CXR noted acute  infiltrate but she was asymptomatic, BNP 238, EKG with HR 90's, denies shortness of breath today, remains on levoalbuterol prn  PAF- TSH 5.4 10/11/2022, HR controlled with digoxin, Digoxin level 0.7 06/22/2022, off anticoagulation due to falls HTN- BUN/creat 21/0.85 11/05/2022, remains on clonidine, metoprolol and valsartan H/o TIA- remains in atorvastatin Hyponatremia- Na+ 133 11/05/2022, remains on sodium tablet  Hypothyroidism- TSH 5.4 10/11/2022, remains on levothyroxine Anemia- hgb 11.6 11/05/2022, had some dark stools but hemoccults negative, remains on iron Cognitive impairment- MMSE 15/30 rehab admission > was 20/30 12/2021, followed by Dr. Everlena Cooper, not on medication RA- remains on Arava Colon cancer- treated with immunotherapy, no surgery, in remission at this time, CEA 5.51 ( 07/18)> was 4.39 (03/12)> was 6.89 (01/23) Urinary incontinence- remains on Gemtesa  Continues to work with PT/OT. Ambulating with walker. No recent falls.   Cerumen impaction resolved with Debrox.    Past Medical History:  Diagnosis Date   Allergy    seasonal   Anemia    Anxiety    Arthritis    Back   Asymptomatic menopausal state    Atrial fibrillation (HCC)    Cancer (HCC) 2006   Colon.  Basal Cell Skin cancer- right arm   Cataract    removed bilateral   Chronic atrial fibrillation (HCC)    Colon cancer (HCC)    Constipation due to pain medication therapy    after heart surgery   Deficiency of other specified B group vitamins    Diplopia    Disorientation, unspecified    Dysrhythmia    PAF   GERD (gastroesophageal reflux disease)    Heart  murmur    Hyperlipidemia    Hypertension    Hypothyroidism    Malignant neoplasm of colon, unspecified (HCC)    Other specified diseases of blood and blood-forming organs    Other specified disorders of bone density and structure, unspecified forearm    Other thrombophilia (HCC)    per matrix   Overactive bladder    per Matrix   Personal history of  other malignant neoplasm of large intestine    Presbycusis, bilateral    per matrix   RA (rheumatoid arthritis) (HCC)    Restless leg    Seizures (HCC)    after Heart Surgey   Stroke (HCC)    TIA- found by neurologist after    Valgus deformity, not elsewhere classified, right knee    per matrix   Vascular dementia, mild, without behavioral disturbance, psychotic disturbance, mood disturbance, and anxiety (HCC)    per matrix   Past Surgical History:  Procedure Laterality Date   ABDOMINAL HYSTERECTOMY  1970   Partial    COLON RESECTION  2006   cancer   COLON SURGERY     COLONOSCOPY     EYE SURGERY Bilateral    Cataract   MAXIMUM ACCESS (MAS)POSTERIOR LUMBAR INTERBODY FUSION (PLIF) 2 LEVEL N/A 04/12/2015   Procedure: Lumbar Three-Five Decompression, Pedicle Screw Fixation, and Posteriolateral Arthrodesis;  Surgeon: Maeola Harman, MD;  Location: [facility];  Service: Neurosurgery;  Laterality: N/A;  L3-4 L4-5 Maximum access posterior lumbar fusion, possible interbodies and resection of synovial cyst at L4-5   MITRAL VALVE REPAIR  01/20/2013   Gore-tex cords to P1, P2, and P3. Magic suture to posterior medial commisure, #30 Physio 1 ring. Done in [place]   TONSILLECTOMY     about 1940   TRICUSPID VALVE SURGERY  01/20/2013   #28 TriAd ring done in [place]    Allergies  Allergen Reactions   Claritin [Loratadine] Other (See Comments)    Listed on MAR Unknown reaction   Codeine Other (See Comments)    "just don't take it well"   Gabapentin Other (See Comments)    Dizziness    Molds & Smuts Other (See Comments)    Dust.  Reaction is not listed on MAR    Pollen Extract Swelling    Grass   Bee Venom Swelling and Rash   Wasp Venom Swelling and Rash    Allergies as of 11/27/2022       Reactions   Claritin [loratadine] Other (See Comments)   Listed on MAR Unknown reaction   Codeine Other (See Comments)   "just don't take it well"   Gabapentin Other (See Comments)    Dizziness    Molds & Smuts Other (See Comments)   Dust.  Reaction is not listed on MAR    Pollen Extract Swelling   Grass   Bee Venom Swelling, Rash   Wasp Venom Swelling, Rash        Medication List        Accurate as of November 27, 2022  2:30 PM. If you have any questions, ask your nurse or doctor.          STOP taking these medications    melatonin 3 MG Tabs tablet Stopped by: Amayia Ciano E Ange Puskas   NON FORMULARY Stopped by: Jamari Diana E Tigran Haynie       TAKE these medications    acetaminophen 500 MG tablet Commonly known as: TYLENOL Take 1,000 mg by mouth in the morning and at bedtime.  amoxicillin 500 MG capsule Commonly known as: AMOXIL Take 500 mg by mouth as needed.   atorvastatin 20 MG tablet Commonly known as: LIPITOR TAKE 1 TABLET DAILY   carbamide peroxide 6.5 % OTIC solution Commonly known as: DEBROX Place 5 drops into both ears every evening.   cloNIDine 0.1 MG tablet Commonly known as: CATAPRES Take 1 tablet (0.1 mg total) by mouth daily as needed. If SBP >180 only   cyanocobalamin 1000 MCG tablet Commonly known as: VITAMIN B12 Take 1,000 mcg by mouth in the morning.   digoxin 0.125 MG tablet Commonly known as: LANOXIN TAKE 1 TABLET DAILY   Gemtesa 75 MG Tabs Generic drug: Vibegron Take 75 mg by mouth daily.   Iron (Ferrous Sulfate) 325 (65 Fe) MG Tabs Take 325 mg by mouth daily.   leflunomide 20 MG tablet Commonly known as: ARAVA Take 20 mg by mouth in the morning.   levalbuterol 0.63 MG/3ML nebulizer solution Commonly known as: XOPENEX Take 0.63 mg by nebulization 2 (two) times daily as needed for wheezing or shortness of breath.   levothyroxine 100 MCG tablet Commonly known as: Synthroid Take 1 tablet (100 mcg total) by mouth daily before breakfast.   metoprolol succinate 50 MG 24 hr tablet Commonly known as: TOPROL-XL Take 1.5 tablets (75 mg total) by mouth daily. Take with or immediately following a meal.   mirtazapine 15 MG  tablet Commonly known as: REMERON TAKE 1 TABLET AT BEDTIME   MULTIVITAMIN ADULT PO Take 1 tablet by mouth every morning.   omeprazole 40 MG capsule Commonly known as: PRILOSEC TAKE 1 CAPSULE DAILY   ondansetron 4 MG tablet Commonly known as: ZOFRAN Take 4 mg by mouth every 6 (six) hours as needed for nausea or vomiting.   polyethylene glycol 17 g packet Commonly known as: MIRALAX / GLYCOLAX Take 17 g by mouth as needed. What changed: Another medication with the same name was removed. Continue taking this medication, and follow the directions you see here. Changed by: Octavia Heir   PreserVision AREDS 2 Caps Take 1 capsule by mouth in the morning and at bedtime.   PSYLLIUM HUSK PO Take 2 capsules by mouth every morning.   sodium chloride 1 g tablet Take 1 g by mouth daily.   valsartan 160 MG tablet Commonly known as: DIOVAN Take 160 mg by mouth daily.   Vitamin D 50 MCG (2000 UT) Caps Take 2,000 Units by mouth in the morning.        Review of Systems  Unable to perform ROS: Dementia    Immunization History  Administered Date(s) Administered   Fluad Quad(high Dose 65+) 01/05/2019, 12/07/2021   H1N1 04/07/2008   Influenza Whole 12/29/2008, 12/22/2009   Influenza, High Dose Seasonal PF 01/15/2012, 12/23/2014, 01/22/2020   Influenza,inj,Quad PF,6+ Mos 01/22/2014, 12/10/2016, 01/17/2018   Influenza,trivalent, recombinat, inj, PF 01/05/2016   Influenza-Unspecified 04/07/2008, 12/15/2014, 01/13/2021   Moderna Covid-19 Vaccine Bivalent Booster 66yrs & up 01/29/2022   Moderna Sars-Covid-2 Vaccination 04/07/2019, 05/05/2019, 02/09/2020   PFIZER(Purple Top)SARS-COV-2 Vaccination 01/06/2021   PPD Test 07/17/2010, 04/19/2015, 04/25/2015   Pfizer Covid-19 Vaccine Bivalent Booster 53yrs & up 07/19/2022   Pneumococcal Conjugate-13 11/10/2013   Pneumococcal Polysaccharide-23 12/27/2010, 12/05/2016   RSV,unspecified 03/28/2022   Tdap 04/15/2017   Zoster  Recombinant(Shingrix) 04/09/2017, 06/18/2017   Pertinent  Health Maintenance Due  Topic Date Due   INFLUENZA VACCINE  10/25/2022   DEXA SCAN  Completed      03/22/2022   11:45 AM 05/21/2022  1:06 PM 06/06/2022    4:55 PM 07/09/2022    1:44 PM 08/29/2022    3:31 PM  Fall Risk  Falls in the past year? 1 1 1  0 1  Was there an injury with Fall? 1 1 1  0 1  Fall Risk Category Calculator 3 3 3  0 2  Fall Risk Category (Retired) High      Patient at Risk for Falls Due to History of fall(s) History of fall(s);Impaired balance/gait;Impaired mobility History of fall(s);Impaired balance/gait;Impaired mobility History of fall(s);Impaired balance/gait;Impaired mobility   Fall risk Follow up Falls evaluation completed Falls evaluation completed Falls evaluation completed;Education provided;Falls prevention discussed Falls evaluation completed Falls evaluation completed   Functional Status Survey:    Vitals:   11/27/22 1200  BP: (!) 163/75  Pulse: (!) 49  Resp: 16  Temp: 98.3 F (36.8 C)  SpO2: 92%  Weight: 127 lb 6.4 oz (57.8 kg)  Height: 5\' 2"  (1.575 m)   Body mass index is 23.3 kg/m. Physical Exam Vitals reviewed.  Constitutional:      General: She is not in acute distress. HENT:     Head: Normocephalic.     Right Ear: There is no impacted cerumen.     Left Ear: There is no impacted cerumen.     Ears:     Comments: Bilateral hearing aids    Nose: Nose normal.     Mouth/Throat:     Mouth: Mucous membranes are moist.  Eyes:     General:        Right eye: No discharge.        Left eye: No discharge.  Cardiovascular:     Rate and Rhythm: Normal rate. Rhythm irregular.     Pulses: Normal pulses.     Heart sounds: Normal heart sounds.  Pulmonary:     Effort: Pulmonary effort is normal. No respiratory distress.     Breath sounds: Normal breath sounds. No wheezing or rales.  Abdominal:     General: Bowel sounds are normal. There is no distension.     Palpations: Abdomen is  soft.     Tenderness: There is no abdominal tenderness.  Musculoskeletal:     Cervical back: Neck supple.     Right lower leg: No edema.     Left lower leg: No edema.  Skin:    General: Skin is warm.     Capillary Refill: Capillary refill takes less than 2 seconds.  Neurological:     General: No focal deficit present.     Mental Status: She is alert. Mental status is at baseline.     Motor: Weakness present.     Gait: Gait abnormal.  Psychiatric:        Mood and Affect: Mood normal.     Comments: Very pleasant, alert to self/person/place, follows commands     Labs reviewed: Recent Labs    12/07/21 0548 12/20/21 2238 01/30/22 1809 02/03/22 0000 03/10/22 0820 03/15/22 0000 06/22/22 1619 08/06/22 1327 10/18/22 0000 10/30/22 0000 11/05/22 0000  NA 137   < > 132*   < > 131*   < > 136 137 133* 137 133*  K 4.2   < > 4.3   < > 4.2   < > 4.7 4.1 4.1 4.4 4.5  CL 104   < > 100   < > 98   < > 99 106 97* 104 99  CO2 23   < > 22   < > 26   < >  22 23 26* 18 25*  GLUCOSE 90   < > 116*   < > 97  --  100* 106*  --   --   --   BUN 15   < > 22   < > 26*   < > 18 19 25* 18 21  CREATININE 0.79   < > 0.88   < > 0.75   < > 0.72 0.76 1.0 0.8 0.9  CALCIUM 9.1   < > 9.4   < > 8.9   < > 9.5 9.4 9.5 8.6* 9.2  MG 1.6*  --  1.9  --   --   --   --   --   --   --   --    < > = values in this interval not displayed.   Recent Labs    03/06/22 1306 03/07/22 0100 10/18/22 0000  AST 18 22 17   ALT 17 16 16   ALKPHOS 76 66 90  BILITOT 0.6 0.7  --   PROT 6.6 5.8*  --   ALBUMIN 3.8 3.1* 4.4   Recent Labs    06/05/22 1432 08/06/22 1327 10/11/22 1405 10/18/22 0000 10/30/22 0000 11/05/22 0000  WBC 8.9 7.6 9.9 8.1 7.5 8.4  NEUTROABS 5.8  --  5.7 4.90  --   --   HGB 11.4* 11.7* 11.9* 12.2 11.2* 11.6*  HCT 33.9* 35.2* 35.6* 36 33* 34*  MCV 100.6* 100.9* 102.6*  --   --   --   PLT 187 170 193 188 177 169   Lab Results  Component Value Date   TSH 5.435 (H) 10/11/2022   Lab Results   Component Value Date   HGBA1C 5.5 01/12/2013   Lab Results  Component Value Date   CHOL 115 02/02/2021   HDL 48 02/02/2021   LDLCALC 49 02/02/2021   TRIG 91 02/02/2021    Significant Diagnostic Results in last 30 days:  No results found.  Assessment/Plan 1. Shortness of breath - noted 08/12 - CXR indicated acute infiltrate, but asymptomatic - thought to be related to PAF - cont levoalbuterol prn  2. Permanent atrial fibrillation (HCC) - HR< 100 with digoxin - off anticoagulation due to falls  3. Essential hypertension - cont clonidine, metoprolol and valsartan  4. History of multiple strokes - cont atorvastatin  5. Hyponatremia - Na+ stable - cont sodium tablet daily  6. Acquired hypothyroidism - TSH stable - cont levothyroxine  7. Iron deficiency anemia, unspecified iron deficiency anemia type - hgb stable, recent hemoccults negative - cont ferrous sulfate  8. Cognitive impairment - MMSE 15/30> was 22/30 - followed by Dr. Everlena Cooper - no behaviors  9. Rheumatoid arthritis involving hand with negative rheumatoid factor, unspecified laterality (HCC) - cont Arava  10. History of colon cancer - in remission - CEA surveillance  11. Urinary incontinence, unspecified type - cont Gemtesa    Family/ staff Communication: plan discussed with patient and nurse  Labs/tests ordered: none

## 2022-11-28 DIAGNOSIS — M6281 Muscle weakness (generalized): Secondary | ICD-10-CM | POA: Diagnosis not present

## 2022-11-28 DIAGNOSIS — R278 Other lack of coordination: Secondary | ICD-10-CM | POA: Diagnosis not present

## 2022-11-28 DIAGNOSIS — R41841 Cognitive communication deficit: Secondary | ICD-10-CM | POA: Diagnosis not present

## 2022-11-28 DIAGNOSIS — N390 Urinary tract infection, site not specified: Secondary | ICD-10-CM | POA: Diagnosis not present

## 2022-11-28 DIAGNOSIS — R2681 Unsteadiness on feet: Secondary | ICD-10-CM | POA: Diagnosis not present

## 2022-11-29 DIAGNOSIS — R278 Other lack of coordination: Secondary | ICD-10-CM | POA: Diagnosis not present

## 2022-11-29 DIAGNOSIS — R2681 Unsteadiness on feet: Secondary | ICD-10-CM | POA: Diagnosis not present

## 2022-11-29 DIAGNOSIS — M6281 Muscle weakness (generalized): Secondary | ICD-10-CM | POA: Diagnosis not present

## 2022-11-29 DIAGNOSIS — N390 Urinary tract infection, site not specified: Secondary | ICD-10-CM | POA: Diagnosis not present

## 2022-11-30 DIAGNOSIS — R2681 Unsteadiness on feet: Secondary | ICD-10-CM | POA: Diagnosis not present

## 2022-11-30 DIAGNOSIS — M6281 Muscle weakness (generalized): Secondary | ICD-10-CM | POA: Diagnosis not present

## 2022-11-30 DIAGNOSIS — R41841 Cognitive communication deficit: Secondary | ICD-10-CM | POA: Diagnosis not present

## 2022-11-30 DIAGNOSIS — R278 Other lack of coordination: Secondary | ICD-10-CM | POA: Diagnosis not present

## 2022-11-30 DIAGNOSIS — N390 Urinary tract infection, site not specified: Secondary | ICD-10-CM | POA: Diagnosis not present

## 2022-12-03 ENCOUNTER — Non-Acute Institutional Stay (SKILLED_NURSING_FACILITY): Payer: Self-pay | Admitting: Adult Health

## 2022-12-03 ENCOUNTER — Encounter: Payer: Self-pay | Admitting: Adult Health

## 2022-12-03 DIAGNOSIS — J984 Other disorders of lung: Secondary | ICD-10-CM

## 2022-12-03 DIAGNOSIS — E871 Hypo-osmolality and hyponatremia: Secondary | ICD-10-CM | POA: Diagnosis not present

## 2022-12-03 DIAGNOSIS — R41841 Cognitive communication deficit: Secondary | ICD-10-CM | POA: Diagnosis not present

## 2022-12-03 DIAGNOSIS — R278 Other lack of coordination: Secondary | ICD-10-CM | POA: Diagnosis not present

## 2022-12-03 DIAGNOSIS — F0150 Vascular dementia without behavioral disturbance: Secondary | ICD-10-CM | POA: Diagnosis not present

## 2022-12-03 DIAGNOSIS — I4821 Permanent atrial fibrillation: Secondary | ICD-10-CM

## 2022-12-03 DIAGNOSIS — I498 Other specified cardiac arrhythmias: Secondary | ICD-10-CM

## 2022-12-03 DIAGNOSIS — N390 Urinary tract infection, site not specified: Secondary | ICD-10-CM | POA: Diagnosis not present

## 2022-12-03 DIAGNOSIS — M6281 Muscle weakness (generalized): Secondary | ICD-10-CM | POA: Diagnosis not present

## 2022-12-03 DIAGNOSIS — S91109A Unspecified open wound of unspecified toe(s) without damage to nail, initial encounter: Secondary | ICD-10-CM

## 2022-12-03 DIAGNOSIS — E039 Hypothyroidism, unspecified: Secondary | ICD-10-CM

## 2022-12-03 DIAGNOSIS — R2681 Unsteadiness on feet: Secondary | ICD-10-CM | POA: Diagnosis not present

## 2022-12-03 DIAGNOSIS — I4891 Unspecified atrial fibrillation: Secondary | ICD-10-CM | POA: Diagnosis not present

## 2022-12-03 NOTE — Progress Notes (Signed)
Location:  [facility] Room Number: 156A Place of Service:  SNF [id number]) Provider:  Tamsen Roers, MD  Patient Care Team: Mahlon Gammon, MD as PCP - General (Internal Medicine) Rennis Golden Lisette Abu, MD as PCP - Cardiology (Cardiology) Drema Dallas, DO as Consulting Physician (Neurology)  Extended Emergency Contact Information Primary Emergency Contact: [name],[name] Address: [address]          [place], [place] [address] Darden Amber of [place] Home Phone: [phone] Mobile Phone: [phone] Relation: Daughter Secondary Emergency Contact: Hollie Salk Address: [address]          [place], [place] [address] Darden Amber of [place] Mobile Phone: [phone] Relation: Son  Code Status:  DNR Goals of care: Advanced Directive information    12/03/2022   10:00 AM  Advanced Directives  Does Patient Have a Medical Advance Directive? Yes  Type of Estate agent of [place];Living will;Out of facility DNR (pink MOST or yellow form)  Does patient want to make changes to medical advance directive? No - Patient declined  Copy of Healthcare Power of Attorney in Chart? Yes - validated most recent copy scanned in chart (See row information)     Chief Complaint  Patient presents with   Medical Management of Chronic Issues    Patient is being see for a routine  visit    Immunizations    Patient is due for a flu shot     HPI:  Pt is a 89 y.o. female seen today for medical management of chronic diseases.   PMH: hx of colon ca in 2006 with recurrence. Noted by colonoscopy at [facility] 02/22/21, bx showed well differentiated adenocarcinoma. CT in Jan of 2023 showed no residual disease. Had 4 cycles of pembrolizumab. Colonoscopy 09/20/21 showed no residual mass.    Also hx of RA on Arava, afib off eliquis due to bleeding risk, OAB, TIA, CVA, HTN, ILD, Gerd, GIB, Hypothyroidism, OA, B12 def, mitral and tricuspid  valve repair, HLD, anemia, depression, constipation.   Nurse at [facility] reports that her HR is running in the 40s at times. She is not symptomatic (no syncope).  BP 130/66 12 lead EKG done which showed Afib, flattened T wave, and PVC bigeminy.  Pt is not on DOAC due to bleeding risk  2D echo 2020: 60-65% severe LAE, mitral valve repair.   She is in skilled care rehab and moving to a regular skilled room soon. She is having more issues with her memory and self care which necessitated the move.   Reports some left foot pain for a wound on the second toe. She also has hallux valgus deformity which is chronic.  No drainage or redness from the wound.   Has been having more shortness of breath exertion progressive over time. HX of ILD not requiring oxygen. CXR in August with no acute issues. CT of the abd and chest 03/31/21 Biapical pleuroparenchymal thickening and scarring. Subpleural reticular opacities compatible with scarring bilaterally. Past Medical History:  Diagnosis Date   Allergy    seasonal   Anemia    Anxiety    Arthritis    Back   Asymptomatic menopausal state    Atrial fibrillation (HCC)    Cancer (HCC) 2006   Colon.  Basal Cell Skin cancer- right arm   Cataract    removed bilateral   Chronic atrial fibrillation (HCC)    Colon cancer (HCC)    Constipation due to pain medication therapy  after heart surgery   Deficiency of other specified B group vitamins    Diplopia    Disorientation, unspecified    Dysrhythmia    PAF   GERD (gastroesophageal reflux disease)    Heart murmur    Hyperlipidemia    Hypertension    Hypothyroidism    Malignant neoplasm of colon, unspecified (HCC)    Other specified diseases of blood and blood-forming organs    Other specified disorders of bone density and structure, unspecified forearm    Other thrombophilia (HCC)    per matrix   Overactive bladder    per Matrix   Personal history of other malignant neoplasm of large intestine     Presbycusis, bilateral    per matrix   RA (rheumatoid arthritis) (HCC)    Restless leg    Seizures (HCC)    after Heart Surgey   Stroke (HCC)    TIA- found by neurologist after    Valgus deformity, not elsewhere classified, right knee    per matrix   Vascular dementia, mild, without behavioral disturbance, psychotic disturbance, mood disturbance, and anxiety (HCC)    per matrix   Past Surgical History:  Procedure Laterality Date   ABDOMINAL HYSTERECTOMY  1970   Partial    COLON RESECTION  2006   cancer   COLON SURGERY     COLONOSCOPY     EYE SURGERY Bilateral    Cataract   MAXIMUM ACCESS (MAS)POSTERIOR LUMBAR INTERBODY FUSION (PLIF) 2 LEVEL N/A 04/12/2015   Procedure: Lumbar Three-Five Decompression, Pedicle Screw Fixation, and Posteriolateral Arthrodesis;  Surgeon: Maeola Harman, MD;  Location: [facility];  Service: Neurosurgery;  Laterality: N/A;  L3-4 L4-5 Maximum access posterior lumbar fusion, possible interbodies and resection of synovial cyst at L4-5   MITRAL VALVE REPAIR  01/20/2013   Gore-tex cords to P1, P2, and P3. Magic suture to posterior medial commisure, #30 Physio 1 ring. Done in [place]   TONSILLECTOMY     about 1940   TRICUSPID VALVE SURGERY  01/20/2013   #28 TriAd ring done in [place]    Allergies  Allergen Reactions   Claritin [Loratadine] Other (See Comments)    Listed on MAR Unknown reaction   Codeine Other (See Comments)    "just don't take it well"   Gabapentin Other (See Comments)    Dizziness    Molds & Smuts Other (See Comments)    Dust.  Reaction is not listed on MAR    Pollen Extract Swelling    Grass   Bee Venom Swelling and Rash   Wasp Venom Swelling and Rash    Outpatient Encounter Medications as of 12/03/2022  Medication Sig   acetaminophen (TYLENOL) 500 MG tablet Take 1,000 mg by mouth in the morning and at bedtime.   amoxicillin (AMOXIL) 500 MG capsule Take 500 mg by mouth as needed.   atorvastatin (LIPITOR) 20 MG tablet  TAKE 1 TABLET DAILY   Cholecalciferol (VITAMIN D) 50 MCG (2000 UT) CAPS Take 2,000 Units by mouth in the morning.   cloNIDine (CATAPRES) 0.1 MG tablet Take 1 tablet (0.1 mg total) by mouth daily as needed. If SBP >180 only   digoxin (LANOXIN) 0.125 MG tablet TAKE 1 TABLET DAILY   Iron, Ferrous Sulfate, 325 (65 Fe) MG TABS Take 325 mg by mouth daily.   leflunomide (ARAVA) 20 MG tablet Take 20 mg by mouth in the morning.   levalbuterol (XOPENEX) 0.63 MG/3ML nebulizer solution Take 0.63 mg by nebulization 2 (two) times daily  as needed for wheezing or shortness of breath.   levothyroxine (SYNTHROID) 100 MCG tablet Take 1 tablet (100 mcg total) by mouth daily before breakfast.   metoprolol succinate (TOPROL-XL) 50 MG 24 hr tablet Take 1.5 tablets (75 mg total) by mouth daily. Take with or immediately following a meal.   mirtazapine (REMERON) 15 MG tablet TAKE 1 TABLET AT BEDTIME   Multiple Vitamin (MULTIVITAMIN ADULT PO) Take 1 tablet by mouth every morning.   Multiple Vitamins-Minerals (PRESERVISION AREDS 2) CAPS Take 1 capsule by mouth in the morning and at bedtime.   omeprazole (PRILOSEC) 40 MG capsule TAKE 1 CAPSULE DAILY   ondansetron (ZOFRAN) 4 MG tablet Take 4 mg by mouth every 6 (six) hours as needed for nausea or vomiting.   polyethylene glycol (MIRALAX / GLYCOLAX) 17 g packet Take 17 g by mouth as needed.   PSYLLIUM HUSK PO Take 2 capsules by mouth every morning.   sodium chloride 1 g tablet Take 1 g by mouth daily.   valsartan (DIOVAN) 160 MG tablet Take 160 mg by mouth daily.   Vibegron (GEMTESA) 75 MG TABS Take 75 mg by mouth daily.   vitamin B-12 (CYANOCOBALAMIN) 1000 MCG tablet Take 1,000 mcg by mouth in the morning.   carbamide peroxide (DEBROX) 6.5 % OTIC solution Place 5 drops into both ears every evening. (Patient not taking: Reported on 12/03/2022)   No facility-administered encounter medications on file as of 12/03/2022.    Review of Systems  Constitutional:  Positive for  activity change. Negative for appetite change, chills, diaphoresis, fatigue, fever and unexpected weight change.  HENT:  Negative for congestion.   Respiratory:  Positive for shortness of breath (on exertion). Negative for cough and wheezing.   Cardiovascular:  Positive for leg swelling. Negative for chest pain and palpitations.  Gastrointestinal:  Negative for abdominal distention, abdominal pain, constipation and diarrhea.  Genitourinary:  Negative for difficulty urinating and dysuria.  Musculoskeletal:  Positive for arthralgias and gait problem. Negative for back pain, joint swelling and myalgias.  Neurological:  Negative for dizziness, tremors, seizures, syncope, facial asymmetry, speech difficulty, weakness, light-headedness, numbness and headaches.  Psychiatric/Behavioral:  Positive for confusion. Negative for agitation and behavioral problems.     Immunization History  Administered Date(s) Administered   Fluad Quad(high Dose 65+) 01/05/2019, 12/07/2021   H1N1 04/07/2008   Influenza Whole 12/29/2008, 12/22/2009   Influenza, High Dose Seasonal PF 01/15/2012, 12/23/2014, 01/22/2020   Influenza,inj,Quad PF,6+ Mos 01/22/2014, 12/10/2016, 01/17/2018   Influenza,trivalent, recombinat, inj, PF 01/05/2016   Influenza-Unspecified 04/07/2008, 12/15/2014, 01/13/2021   Moderna Covid-19 Vaccine Bivalent Booster 50yrs & up 01/29/2022   Moderna Sars-Covid-2 Vaccination 04/07/2019, 05/05/2019, 02/09/2020   PFIZER(Purple Top)SARS-COV-2 Vaccination 01/06/2021   PPD Test 07/17/2010, 04/19/2015, 04/25/2015   Pfizer Covid-19 Vaccine Bivalent Booster 109yrs & up 07/19/2022   Pneumococcal Conjugate-13 11/10/2013   Pneumococcal Polysaccharide-23 12/27/2010, 12/05/2016   RSV,unspecified 03/28/2022   Tdap 04/15/2017   Zoster Recombinant(Shingrix) 04/09/2017, 06/18/2017   Pertinent  Health Maintenance Due  Topic Date Due   INFLUENZA VACCINE  10/25/2022   DEXA SCAN  Completed      03/22/2022   11:45  AM 05/21/2022    1:06 PM 06/06/2022    4:55 PM 07/09/2022    1:44 PM 08/29/2022    3:31 PM  Fall Risk  Falls in the past year? 1 1 1  0 1  Was there an injury with Fall? 1 1 1  0 1  Fall Risk Category Calculator 3 3 3  0 2  Fall Risk Category (Retired) High      Patient at Risk for Falls Due to History of fall(s) History of fall(s);Impaired balance/gait;Impaired mobility History of fall(s);Impaired balance/gait;Impaired mobility History of fall(s);Impaired balance/gait;Impaired mobility   Fall risk Follow up Falls evaluation completed Falls evaluation completed Falls evaluation completed;Education provided;Falls prevention discussed Falls evaluation completed Falls evaluation completed   Functional Status Survey:    Vitals:   12/03/22 0958  BP: 130/66  Pulse: 82  Resp: 16  Temp: 98.1 F (36.7 C)  TempSrc: Temporal  SpO2: 95%  Weight: 127 lb 6.4 oz (57.8 kg)  Height: 5\' 2"  (1.575 m)   Body mass index is 23.3 kg/m. Physical Exam Vitals and nursing note reviewed.  Constitutional:      General: She is not in acute distress.    Appearance: She is not diaphoretic.  HENT:     Head: Normocephalic and atraumatic.  Neck:     Vascular: No JVD.  Cardiovascular:     Rate and Rhythm: Bradycardia present. Rhythm irregular.     Heart sounds: Murmur heard.  Pulmonary:     Effort: Pulmonary effort is normal. No respiratory distress.     Breath sounds: Normal breath sounds. No wheezing.  Abdominal:     General: Bowel sounds are normal. There is no distension.     Palpations: Abdomen is soft.     Tenderness: There is no abdominal tenderness.  Skin:    General: Skin is warm and dry.  Neurological:     Mental Status: She is alert. Mental status is at baseline.     Labs reviewed: Recent Labs    12/07/21 0548 12/20/21 2238 01/30/22 1809 02/03/22 0000 03/10/22 0820 03/15/22 0000 06/22/22 1619 08/06/22 1327 10/18/22 0000 10/30/22 0000 11/05/22 0000  NA 137   < > 132*   < > 131*    < > 136 137 133* 137 133*  K 4.2   < > 4.3   < > 4.2   < > 4.7 4.1 4.1 4.4 4.5  CL 104   < > 100   < > 98   < > 99 106 97* 104 99  CO2 23   < > 22   < > 26   < > 22 23 26* 18 25*  GLUCOSE 90   < > 116*   < > 97  --  100* 106*  --   --   --   BUN 15   < > 22   < > 26*   < > 18 19 25* 18 21  CREATININE 0.79   < > 0.88   < > 0.75   < > 0.72 0.76 1.0 0.8 0.9  CALCIUM 9.1   < > 9.4   < > 8.9   < > 9.5 9.4 9.5 8.6* 9.2  MG 1.6*  --  1.9  --   --   --   --   --   --   --   --    < > = values in this interval not displayed.   Recent Labs    03/06/22 1306 03/07/22 0100 10/18/22 0000  AST 18 22 17   ALT 17 16 16   ALKPHOS 76 66 90  BILITOT 0.6 0.7  --   PROT 6.6 5.8*  --   ALBUMIN 3.8 3.1* 4.4   Recent Labs    06/05/22 1432 08/06/22 1327 10/11/22 1405 10/18/22 0000 10/30/22 0000 11/05/22 0000  WBC 8.9 7.6 9.9 8.1 7.5 8.4  NEUTROABS 5.8  --  5.7 4.90  --   --   HGB 11.4* 11.7* 11.9* 12.2 11.2* 11.6*  HCT 33.9* 35.2* 35.6* 36 33* 34*  MCV 100.6* 100.9* 102.6*  --   --   --   PLT 187 170 193 188 177 169   Lab Results  Component Value Date   TSH 5.435 (H) 10/11/2022   Lab Results  Component Value Date   HGBA1C 5.5 01/12/2013   Lab Results  Component Value Date   CHOL 115 02/02/2021   HDL 48 02/02/2021   LDLCALC 49 02/02/2021   TRIG 91 02/02/2021    Significant Diagnostic Results in last 30 days:  No results found.  Assessment/Plan  1. Bigeminy No symptoms Currently on metoprolol and digoxin Will check stat CMP Mg and digoxin level Ask staff to fax EKG to cardiology She has f/u apt with them Discussed with Dr. Chales Abrahams.   2. Permanent atrial fibrillation (HCC) Continue digoxin and metoprolol  BP is on the high side at times, would keep digoxin for issue with afib.  See #1 Not on DOAC as above.   3. Vascular dementia without behavioral disturbance (HCC) Progressing over time Now requiring skilled care   4. Hyponatremia Recheck level   5. Hypothyroidism,  unspecified type Lab Results  Component Value Date   TSH 5.435 (H) 10/11/2022   Continue Synthroid   6. Chronic lung disease Not requiring oxygen Has RA followed by rheumatology   7. Open wound of toe, initial encounter Does not appear infected Has hallux valgus Avoid tight shoes Dressing changes per [facility]   Family/ staff Communication: nurse  Labs/tests ordered:  CMP Mg digoxin level

## 2022-12-04 ENCOUNTER — Other Ambulatory Visit: Payer: Self-pay | Admitting: Orthopedic Surgery

## 2022-12-04 DIAGNOSIS — R2681 Unsteadiness on feet: Secondary | ICD-10-CM | POA: Diagnosis not present

## 2022-12-04 DIAGNOSIS — N390 Urinary tract infection, site not specified: Secondary | ICD-10-CM | POA: Diagnosis not present

## 2022-12-04 DIAGNOSIS — R278 Other lack of coordination: Secondary | ICD-10-CM | POA: Diagnosis not present

## 2022-12-04 DIAGNOSIS — M6281 Muscle weakness (generalized): Secondary | ICD-10-CM | POA: Diagnosis not present

## 2022-12-04 MED ORDER — MAGNESIUM OXIDE 250 MG PO TABS: 250.0000 mg | ORAL_TABLET | Freq: Every morning | ORAL | Status: AC

## 2022-12-04 NOTE — Progress Notes (Signed)
Stat labs due to bigeminy 12/03/2022: Na+ 136, K+ 4.0, BUN/creat 21/0.94, GFR 58, AST/ALT 17/18, digoxin level <0.80, magnesium 1.6. Magnesium oxide 250 mg po daily x 7 days.

## 2022-12-05 DIAGNOSIS — R278 Other lack of coordination: Secondary | ICD-10-CM | POA: Diagnosis not present

## 2022-12-05 DIAGNOSIS — R41841 Cognitive communication deficit: Secondary | ICD-10-CM | POA: Diagnosis not present

## 2022-12-05 DIAGNOSIS — R2681 Unsteadiness on feet: Secondary | ICD-10-CM | POA: Diagnosis not present

## 2022-12-05 DIAGNOSIS — N390 Urinary tract infection, site not specified: Secondary | ICD-10-CM | POA: Diagnosis not present

## 2022-12-05 DIAGNOSIS — M6281 Muscle weakness (generalized): Secondary | ICD-10-CM | POA: Diagnosis not present

## 2022-12-06 DIAGNOSIS — M6281 Muscle weakness (generalized): Secondary | ICD-10-CM | POA: Diagnosis not present

## 2022-12-06 DIAGNOSIS — R2681 Unsteadiness on feet: Secondary | ICD-10-CM | POA: Diagnosis not present

## 2022-12-06 DIAGNOSIS — R278 Other lack of coordination: Secondary | ICD-10-CM | POA: Diagnosis not present

## 2022-12-06 DIAGNOSIS — R41841 Cognitive communication deficit: Secondary | ICD-10-CM | POA: Diagnosis not present

## 2022-12-06 DIAGNOSIS — N390 Urinary tract infection, site not specified: Secondary | ICD-10-CM | POA: Diagnosis not present

## 2022-12-07 DIAGNOSIS — N390 Urinary tract infection, site not specified: Secondary | ICD-10-CM | POA: Diagnosis not present

## 2022-12-07 DIAGNOSIS — R41841 Cognitive communication deficit: Secondary | ICD-10-CM | POA: Diagnosis not present

## 2022-12-07 DIAGNOSIS — R2681 Unsteadiness on feet: Secondary | ICD-10-CM | POA: Diagnosis not present

## 2022-12-07 DIAGNOSIS — M6281 Muscle weakness (generalized): Secondary | ICD-10-CM | POA: Diagnosis not present

## 2022-12-07 DIAGNOSIS — R278 Other lack of coordination: Secondary | ICD-10-CM | POA: Diagnosis not present

## 2022-12-10 DIAGNOSIS — N390 Urinary tract infection, site not specified: Secondary | ICD-10-CM | POA: Diagnosis not present

## 2022-12-10 DIAGNOSIS — R2681 Unsteadiness on feet: Secondary | ICD-10-CM | POA: Diagnosis not present

## 2022-12-10 DIAGNOSIS — R278 Other lack of coordination: Secondary | ICD-10-CM | POA: Diagnosis not present

## 2022-12-10 DIAGNOSIS — M6281 Muscle weakness (generalized): Secondary | ICD-10-CM | POA: Diagnosis not present

## 2022-12-11 DIAGNOSIS — N390 Urinary tract infection, site not specified: Secondary | ICD-10-CM | POA: Diagnosis not present

## 2022-12-11 DIAGNOSIS — R278 Other lack of coordination: Secondary | ICD-10-CM | POA: Diagnosis not present

## 2022-12-11 DIAGNOSIS — R2681 Unsteadiness on feet: Secondary | ICD-10-CM | POA: Diagnosis not present

## 2022-12-11 DIAGNOSIS — M6281 Muscle weakness (generalized): Secondary | ICD-10-CM | POA: Diagnosis not present

## 2022-12-12 ENCOUNTER — Ambulatory Visit: Payer: Medicare Other | Admitting: Neurology

## 2022-12-12 DIAGNOSIS — R41841 Cognitive communication deficit: Secondary | ICD-10-CM | POA: Diagnosis not present

## 2022-12-13 DIAGNOSIS — R278 Other lack of coordination: Secondary | ICD-10-CM | POA: Diagnosis not present

## 2022-12-13 DIAGNOSIS — M6281 Muscle weakness (generalized): Secondary | ICD-10-CM | POA: Diagnosis not present

## 2022-12-13 DIAGNOSIS — R2681 Unsteadiness on feet: Secondary | ICD-10-CM | POA: Diagnosis not present

## 2022-12-13 DIAGNOSIS — N390 Urinary tract infection, site not specified: Secondary | ICD-10-CM | POA: Diagnosis not present

## 2022-12-14 ENCOUNTER — Encounter: Payer: Self-pay | Admitting: Cardiovascular Disease

## 2022-12-14 ENCOUNTER — Ambulatory Visit: Payer: Medicare Other | Attending: Cardiovascular Disease | Admitting: Cardiovascular Disease

## 2022-12-14 VITALS — BP 122/82 | HR 92 | Ht 62.0 in | Wt 130.2 lb

## 2022-12-14 DIAGNOSIS — R41841 Cognitive communication deficit: Secondary | ICD-10-CM | POA: Diagnosis not present

## 2022-12-14 DIAGNOSIS — I5032 Chronic diastolic (congestive) heart failure: Secondary | ICD-10-CM | POA: Insufficient documentation

## 2022-12-14 DIAGNOSIS — I10 Essential (primary) hypertension: Secondary | ICD-10-CM | POA: Insufficient documentation

## 2022-12-14 DIAGNOSIS — I4821 Permanent atrial fibrillation: Secondary | ICD-10-CM | POA: Diagnosis not present

## 2022-12-14 DIAGNOSIS — Z8673 Personal history of transient ischemic attack (TIA), and cerebral infarction without residual deficits: Secondary | ICD-10-CM | POA: Diagnosis not present

## 2022-12-14 DIAGNOSIS — D6869 Other thrombophilia: Secondary | ICD-10-CM | POA: Insufficient documentation

## 2022-12-14 DIAGNOSIS — Z9889 Other specified postprocedural states: Secondary | ICD-10-CM | POA: Insufficient documentation

## 2022-12-14 DIAGNOSIS — I493 Ventricular premature depolarization: Secondary | ICD-10-CM | POA: Insufficient documentation

## 2022-12-14 MED ORDER — APIXABAN 2.5 MG PO TABS: 2.5000 mg | ORAL_TABLET | Freq: Two times a day (BID) | ORAL | Status: DC

## 2022-12-14 MED ORDER — METOPROLOL SUCCINATE ER 100 MG PO TB24: 100.0000 mg | ORAL_TABLET | Freq: Every day | ORAL | Status: DC

## 2022-12-14 NOTE — Progress Notes (Signed)
Speech Cardiology Office Note:  .   Date:  12/14/2022  ID:  Kathy Velez, DOB [birth year], MRN [id number] PCP: Mahlon Gammon, MD  [facility] Providers Cardiologist:  Thurmon Fair, MD    History of Present Illness: .   Kathy Velez is a 89 y.o. female with paroxysmal atrial fibrillation, history of stroke, vascular dementia, carotid artery disease, aortic atherosclerosis, hypertension, history of mitral and tricuspid valve repair (MV #30 Physio Ring; TV # 28 TriAd Ring 2014, [place]), chronic interstitial lung disease, history of colon cancer in remission, GERD, treated hypothyroidism, hyponatremia (probable SIADH).    She has had several falls, including one complicated by scalp injury and protracted arterial bleeding requiring transfusion last year.  Anticoagulation with Eliquis was subsequently stopped.  She is now using a walker and has a knee brace and her stability has greatly improved.  She has not had any falls in the last 8 months according to her daughter.  She has recently transitioned to the skilled nursing facility, due to her worsening memory issues.  She has closer supervision there.  She becomes short of breath easily, using her walker to go from her room to the dining hall or even to the restroom.  She does not become short of breath changing clothes or doing personal toiletry.  She does not have orthopnea or PND.  She is not aware of palpitations.  She has never had complaints of chest pain.  She has not had any recent new focal neurological complaints, but her memory has been deteriorating slowly, the diagnosis being vascular dementia.  She does have a history of several TIAs in the past and one major stroke presenting with a fall and aphasia in January 2021.  CT angiography at that time showed no significant large vessel stenoses in the cerebral circulation but there was mild atherosclerotic calcification scattered throughout.  She has been treated as permanent atrial  fibrillation with rate control medications and is on metoprolol succinate 75 mg daily and digoxin 0.125 mg daily.  Digoxin level was checked over the last few months have consistently in the nontoxic range at 0.7-0.8.  Occasionally needs staff at [facility] will hold the digoxin because of bradycardia (as low as 44 bpm).  She has not had problems with low blood pressure, but occasionally is administered an extra dose of clonidine for elevated blood pressure.  Her ECG today as well as tracings from her assisted living facility show very frequent PVCs, often in a pattern of bigeminal association with her conducted supraventricular beats, suggesting possible triggered mechanism.  ROS: Worsening short-term memory, but still knows her daughter and grandchildren and is able to hold a coherent conversation.  Studies Reviewed: Marland Kitchen   EKG Interpretation Date/Time:  Friday December 14 2022 09:55:03 EDT Ventricular Rate:  92 PR Interval:    QRS Duration:  88 QT Interval:  348 QTC Calculation: 430 R Axis:   -27  Text Interpretation: Atrial fibrillation with premature ventricular or aberrantly conducted complexes Left ventricular hypertrophy with repolarization abnormality ( R in aVL ) When compared with ECG of 06-Aug-2022 13:32, No significant change since last tracing Confirmed by Moria Brophy ([id number]) on 12/14/2022 10:07:50 AM    Echocardiogram 2020   1. The left ventricle has normal systolic function with an ejection  fraction of 60-65%. The cavity size was normal. There is mildly increased  left ventricular wall thickness. Left ventricular diastolic function could  not be evaluated secondary to atrial   fibrillation. No evidence of left  ventricular regional wall motion  abnormalities.   2. Left atrial size was severely dilated.   3. A 30 mm PhysioRing valve is present in the mitral position.   4. Status post tricuspid valve repair. 28 mm TriAd annuloplasty ring is  present in the tricuspid  position.   5. The tricuspid valve is abnormal.   6. The aortic valve is tricuspid. Mild sclerosis of the aortic valve. No  stenosis of the aortic valve.   7. The aorta is normal unless otherwise noted.   8. The inferior vena cava was normal in size with <50% respiratory  variability.   Risk Assessment/Calculations:    CHA2DS2-VASc Score = 8   This indicates a 10.8% annual risk of stroke. The patient's score is based upon: CHF History: 1 HTN History: 1 Diabetes History: 0 Stroke History: 2 Vascular Disease History: 1 Age Score: 2 Gender Score: 1            Physical Exam:   VS:  BP 122/82 (BP Location: Left Arm, Patient Position: Sitting, Cuff Size: Normal)   Pulse 92   Ht 5\' 2"  (1.575 m)   Wt 130 lb 3.2 oz (59.1 kg)   SpO2 92%   BMI 23.81 kg/m    Wt Readings from Last 3 Encounters:  12/14/22 130 lb 3.2 oz (59.1 kg)  12/03/22 127 lb 6.4 oz (57.8 kg)  11/27/22 127 lb 6.4 oz (57.8 kg)    GEN: Well nourished, well developed in no acute distress NECK: No JVD; No carotid bruits CARDIAC: irregular, no murmurs, rubs, gallops RESPIRATORY:  Clear to auscultation without rales, wheezing or rhonchi  ABDOMEN: Soft, non-tender, non-distended EXTREMITIES:  No edema; No deformity   ASSESSMENT AND PLAN: .    1. Permanent atrial fibrillation (HCC)   2. Frequent PVCs   3. Acquired thrombophilia (HCC)   4. History of ischemic stroke   5. Chronic diastolic heart failure (HCC)   6. Status post mitral valve repair   7. Status post tricuspid valve repair   8. Essential hypertension      AFib: Permanent arrhythmia treated with rate control.  Severe left atrial dilation.  Concerned about the use of digoxin at her age and I wonder whether her frequent PVCs that appear to have a bigeminal/trigger pattern are related to digoxin effect.  Since her blood pressure is not low and is actually occasionally high I would like to increase her metoprolol succinate to 100 mg daily and stop the  digoxin.  Discussed with her daughter.  Reports of unexpected bradycardia should be taken with a grain of salt to the possibility of "pulse deficit" and the setting of atrial fibrillation and frequent PVCs.  If bradycardia is noted by peripheral pulse check (manual palpation, pulse oximetry, automatic blood pressure cuff) this should be confirmed either by direct cardiac auscultation or an electrical tracing. Stroke risk: Severely elevated risk with a CHA2DS2-VASc score of 8 and previous history of stroke.  Eliquis was appropriately stopped when she was having frequent falls complicated by serious bleeding problems.  The situation seems to have now improved.  Anemia has resolved.  She has not fallen in at least 8 months.  Her daughter thinks she is much more stable since she is consistently using her walker and has a knee brace.  There have been no bleeding events.  She is now under close supervision in the skilled nursing facility.  We had a lengthy discussion of the relative risk of bleeding complications and ischemic stroke.  Answered several questions.  In the end we decided together that it is a safer approach to restart her Eliquis 2.5 mg twice daily (dose adjusted for age and small body size).  If she starts having falls or other bleeding complications again, we can always withdraw the medication. CHF: She is describing NYHA functional class 2-3 dyspnea.  I wonder if this is related to diastolic heart failure (decreased filling time during atrial fibrillation with rapid ventricular rates in the setting of relative mitral stenosis following annuloplasty).  This is another reason to use higher doses of beta-blockers rather than digoxin, since the latter has little effect on exercise induced increases in heart rate.  Today she appears to be clinically euvolemic.  There are no murmurs of valvular regurgitation or exam. S/P MV and TV repair: Excellent result based on most recent echo.  Unless symptoms change I  do not think repeating the echocardiogram would lead to a change in treatment. HTN: Adequate control today.  A similar caveat applies to blood pressure reading with an automatic blood pressure cuff in the setting of irregular rhythm.  If unusually high or unusually low readings are recorded, would recommend rechecking the blood pressure with a manual cuff to get a more accurate report.       Dispo: Start Eliquis 2.5 mg twice daily.  Change metoprolol succinate to 100 mg once daily.  Stop digoxin.  Follow-up in 3 months.  Let us know if she has falls or bleeding complications.  Signed, Thurmon Fair, MD

## 2022-12-14 NOTE — Patient Instructions (Addendum)
Medication Instructions:  STOP DIGOXIN and ASPIRIN  START ELIQUIS 2.5 MG TWICE DAILY INCREASE METOPROLOL SUCCINATE (TOPROL XL) TO 100 MG A DAY *If you need a refill on your cardiac medications before your next appointment, please call your pharmacy*  Follow-Up: At [facility], you and your health needs are our priority.  As part of our continuing mission to provide you with exceptional heart care, we have created designated Provider Care Teams.  These Care Teams include your primary Cardiologist (physician) and Advanced Practice Providers (APPs -  Physician Assistants and Nurse Practitioners) who all work together to provide you with the care you need, when you need it.  We recommend signing up for the patient portal called "MyChart".  Sign up information is provided on this After Visit Summary.  MyChart is used to connect with patients for Virtual Visits (Telemedicine).  Patients are able to view lab/test results, encounter notes, upcoming appointments, etc.  Non-urgent messages can be sent to your provider as well.   To learn more about what you can do with MyChart, go to [email or web address].    Your next appointment:   3-4 month(s)= Wednesday Jan 22nd at 10:00  Provider:   Thurmon Fair, MD

## 2022-12-17 DIAGNOSIS — N390 Urinary tract infection, site not specified: Secondary | ICD-10-CM | POA: Diagnosis not present

## 2022-12-17 DIAGNOSIS — R41841 Cognitive communication deficit: Secondary | ICD-10-CM | POA: Diagnosis not present

## 2022-12-17 DIAGNOSIS — R2681 Unsteadiness on feet: Secondary | ICD-10-CM | POA: Diagnosis not present

## 2022-12-17 DIAGNOSIS — R278 Other lack of coordination: Secondary | ICD-10-CM | POA: Diagnosis not present

## 2022-12-17 DIAGNOSIS — M6281 Muscle weakness (generalized): Secondary | ICD-10-CM | POA: Diagnosis not present

## 2022-12-18 DIAGNOSIS — N390 Urinary tract infection, site not specified: Secondary | ICD-10-CM | POA: Diagnosis not present

## 2022-12-18 DIAGNOSIS — R2681 Unsteadiness on feet: Secondary | ICD-10-CM | POA: Diagnosis not present

## 2022-12-18 DIAGNOSIS — R278 Other lack of coordination: Secondary | ICD-10-CM | POA: Diagnosis not present

## 2022-12-18 DIAGNOSIS — M6281 Muscle weakness (generalized): Secondary | ICD-10-CM | POA: Diagnosis not present

## 2022-12-19 ENCOUNTER — Ambulatory Visit: Payer: Medicare Other | Admitting: Neurology

## 2022-12-19 DIAGNOSIS — R278 Other lack of coordination: Secondary | ICD-10-CM | POA: Diagnosis not present

## 2022-12-19 DIAGNOSIS — M6281 Muscle weakness (generalized): Secondary | ICD-10-CM | POA: Diagnosis not present

## 2022-12-19 DIAGNOSIS — N390 Urinary tract infection, site not specified: Secondary | ICD-10-CM | POA: Diagnosis not present

## 2022-12-19 DIAGNOSIS — R2681 Unsteadiness on feet: Secondary | ICD-10-CM | POA: Diagnosis not present

## 2022-12-20 DIAGNOSIS — R2681 Unsteadiness on feet: Secondary | ICD-10-CM | POA: Diagnosis not present

## 2022-12-20 DIAGNOSIS — M6281 Muscle weakness (generalized): Secondary | ICD-10-CM | POA: Diagnosis not present

## 2022-12-20 DIAGNOSIS — R278 Other lack of coordination: Secondary | ICD-10-CM | POA: Diagnosis not present

## 2022-12-20 DIAGNOSIS — N390 Urinary tract infection, site not specified: Secondary | ICD-10-CM | POA: Diagnosis not present

## 2022-12-21 DIAGNOSIS — R41841 Cognitive communication deficit: Secondary | ICD-10-CM | POA: Diagnosis not present

## 2022-12-24 DIAGNOSIS — R41841 Cognitive communication deficit: Secondary | ICD-10-CM | POA: Diagnosis not present

## 2022-12-25 DIAGNOSIS — N390 Urinary tract infection, site not specified: Secondary | ICD-10-CM | POA: Diagnosis not present

## 2022-12-25 DIAGNOSIS — R2681 Unsteadiness on feet: Secondary | ICD-10-CM | POA: Diagnosis not present

## 2022-12-25 DIAGNOSIS — F01A0 Vascular dementia, mild, without behavioral disturbance, psychotic disturbance, mood disturbance, and anxiety: Secondary | ICD-10-CM | POA: Diagnosis not present

## 2022-12-25 DIAGNOSIS — R41841 Cognitive communication deficit: Secondary | ICD-10-CM | POA: Diagnosis not present

## 2022-12-25 DIAGNOSIS — M6281 Muscle weakness (generalized): Secondary | ICD-10-CM | POA: Diagnosis not present

## 2022-12-25 DIAGNOSIS — R278 Other lack of coordination: Secondary | ICD-10-CM | POA: Diagnosis not present

## 2022-12-25 DIAGNOSIS — M6389 Disorders of muscle in diseases classified elsewhere, multiple sites: Secondary | ICD-10-CM | POA: Diagnosis not present

## 2022-12-26 DIAGNOSIS — M6281 Muscle weakness (generalized): Secondary | ICD-10-CM | POA: Diagnosis not present

## 2022-12-26 DIAGNOSIS — F01A0 Vascular dementia, mild, without behavioral disturbance, psychotic disturbance, mood disturbance, and anxiety: Secondary | ICD-10-CM | POA: Diagnosis not present

## 2022-12-26 DIAGNOSIS — R2681 Unsteadiness on feet: Secondary | ICD-10-CM | POA: Diagnosis not present

## 2022-12-26 DIAGNOSIS — M6389 Disorders of muscle in diseases classified elsewhere, multiple sites: Secondary | ICD-10-CM | POA: Diagnosis not present

## 2022-12-26 DIAGNOSIS — N390 Urinary tract infection, site not specified: Secondary | ICD-10-CM | POA: Diagnosis not present

## 2022-12-26 DIAGNOSIS — R278 Other lack of coordination: Secondary | ICD-10-CM | POA: Diagnosis not present

## 2022-12-26 DIAGNOSIS — R41841 Cognitive communication deficit: Secondary | ICD-10-CM | POA: Diagnosis not present

## 2022-12-27 DIAGNOSIS — M6389 Disorders of muscle in diseases classified elsewhere, multiple sites: Secondary | ICD-10-CM | POA: Diagnosis not present

## 2022-12-27 DIAGNOSIS — F01A0 Vascular dementia, mild, without behavioral disturbance, psychotic disturbance, mood disturbance, and anxiety: Secondary | ICD-10-CM | POA: Diagnosis not present

## 2022-12-27 DIAGNOSIS — N390 Urinary tract infection, site not specified: Secondary | ICD-10-CM | POA: Diagnosis not present

## 2022-12-27 DIAGNOSIS — R2681 Unsteadiness on feet: Secondary | ICD-10-CM | POA: Diagnosis not present

## 2022-12-27 DIAGNOSIS — R278 Other lack of coordination: Secondary | ICD-10-CM | POA: Diagnosis not present

## 2022-12-27 DIAGNOSIS — M6281 Muscle weakness (generalized): Secondary | ICD-10-CM | POA: Diagnosis not present

## 2022-12-28 ENCOUNTER — Non-Acute Institutional Stay (SKILLED_NURSING_FACILITY): Payer: Medicare Other | Admitting: Adult Health

## 2022-12-28 DIAGNOSIS — R2681 Unsteadiness on feet: Secondary | ICD-10-CM

## 2022-12-28 DIAGNOSIS — I10 Essential (primary) hypertension: Secondary | ICD-10-CM

## 2022-12-28 DIAGNOSIS — S91109D Unspecified open wound of unspecified toe(s) without damage to nail, subsequent encounter: Secondary | ICD-10-CM

## 2022-12-28 DIAGNOSIS — N390 Urinary tract infection, site not specified: Secondary | ICD-10-CM | POA: Diagnosis not present

## 2022-12-28 DIAGNOSIS — R41841 Cognitive communication deficit: Secondary | ICD-10-CM | POA: Diagnosis not present

## 2022-12-28 DIAGNOSIS — I493 Ventricular premature depolarization: Secondary | ICD-10-CM | POA: Diagnosis not present

## 2022-12-28 DIAGNOSIS — E782 Mixed hyperlipidemia: Secondary | ICD-10-CM

## 2022-12-28 DIAGNOSIS — F0150 Vascular dementia without behavioral disturbance: Secondary | ICD-10-CM

## 2022-12-28 DIAGNOSIS — M6281 Muscle weakness (generalized): Secondary | ICD-10-CM | POA: Diagnosis not present

## 2022-12-28 DIAGNOSIS — I4821 Permanent atrial fibrillation: Secondary | ICD-10-CM

## 2022-12-28 DIAGNOSIS — M6389 Disorders of muscle in diseases classified elsewhere, multiple sites: Secondary | ICD-10-CM | POA: Diagnosis not present

## 2022-12-28 DIAGNOSIS — F01A0 Vascular dementia, mild, without behavioral disturbance, psychotic disturbance, mood disturbance, and anxiety: Secondary | ICD-10-CM | POA: Diagnosis not present

## 2022-12-28 DIAGNOSIS — R278 Other lack of coordination: Secondary | ICD-10-CM | POA: Diagnosis not present

## 2022-12-28 DIAGNOSIS — E039 Hypothyroidism, unspecified: Secondary | ICD-10-CM | POA: Diagnosis not present

## 2022-12-29 ENCOUNTER — Encounter: Payer: Self-pay | Admitting: Adult Health

## 2022-12-29 NOTE — Progress Notes (Signed)
Location:  Medical illustrator of Service:  SNF (31) Provider:  Tamsen Roers, MD  Patient Care Team: Mahlon Gammon, MD as PCP - General (Internal Medicine) Croitoru, Rachelle Hora, MD as PCP - Cardiology (Cardiology) Drema Dallas, DO as Consulting Physician (Neurology)  Extended Emergency Contact Information Primary Emergency Contact: [name],[name] Address: [address]          [place], [place] [address] Darden Amber of [place] Home Phone: [phone] Mobile Phone: [phone] Relation: Daughter Secondary Emergency Contact: Hollie Salk Address: [address]          [place], [place] [address] Darden Amber of [place] Mobile Phone: [phone] Relation: Son  Code Status:  DNR Goals of care: Advanced Directive information    12/03/2022   10:00 AM  Advanced Directives  Does Patient Have a Medical Advance Directive? Yes  Type of Estate agent of [place];Living will;Out of facility DNR (pink MOST or yellow form)  Does patient want to make changes to medical advance directive? No - Patient declined  Copy of Healthcare Power of Attorney in Chart? Yes - validated most recent copy scanned in chart (See row information)     Chief Complaint  Patient presents with   Medical Management of Chronic Issues    HPI:  Pt is a 89 y.o. female seen today for medical management of chronic diseases.   PMH: hx of colon ca in 2006 with recurrence. Noted by colonoscopy at [facility] 02/22/21, bx showed well differentiated adenocarcinoma. CT in Jan of 2023 showed no residual disease. Had 4 cycles of pembrolizumab. Colonoscopy 09/20/21 showed no residual mass.    Also hx of RA on Arava, afib off eliquis due to bleeding risk, OAB, TIA, CVA, HTN, ILD, Gerd, GIB, Hypothyroidism, OA, B12 def, mitral and tricuspid valve repair, HLD, anemia, depression, constipation.   Seen by cardiology on 12/14/22 with bigeminy. Taken off digoxin. Placed  back on eliquis for CVA risk prevention and metoprolol increased. HR recorded in matrix improved 60-90 range. SBP ranging 130-160.   2D echo 2020: 60-65% severe LAE, mitral valve repair.   She has a wound to the second left toe. At times there is edema on this foot that comes and goes. No fever or redness. Hallux valgus is noted. She reports some foot pain.   She is walking with a walker and uses a knee brace She has adjusted well to skilled care. Her memory is declining over time with more reminders and cuing needed. Participates in activities. No wandering. Very pleasant.  Past Medical History:  Diagnosis Date   Allergy    seasonal   Anemia    Anxiety    Arthritis    Back   Asymptomatic menopausal state    Atrial fibrillation (HCC)    Cancer (HCC) 2006   Colon.  Basal Cell Skin cancer- right arm   Cataract    removed bilateral   Chronic atrial fibrillation (HCC)    Colon cancer (HCC)    Constipation due to pain medication therapy    after heart surgery   Deficiency of other specified B group vitamins    Diplopia    Disorientation, unspecified    Dysrhythmia    PAF   GERD (gastroesophageal reflux disease)    Heart murmur    Hyperlipidemia    Hypertension    Hypothyroidism    Malignant neoplasm of colon, unspecified (HCC)    Other specified diseases of blood and blood-forming organs  Other specified disorders of bone density and structure, unspecified forearm    Other thrombophilia (HCC)    per matrix   Overactive bladder    per Matrix   Personal history of other malignant neoplasm of large intestine    Presbycusis, bilateral    per matrix   RA (rheumatoid arthritis) (HCC)    Restless leg    Seizures (HCC)    after Heart Surgey   Stroke (HCC)    TIA- found by neurologist after    Valgus deformity, not elsewhere classified, right knee    per matrix   Vascular dementia, mild, without behavioral disturbance, psychotic disturbance, mood disturbance, and anxiety  (HCC)    per matrix   Past Surgical History:  Procedure Laterality Date   ABDOMINAL HYSTERECTOMY  1970   Partial    COLON RESECTION  2006   cancer   COLON SURGERY     COLONOSCOPY     EYE SURGERY Bilateral    Cataract   MAXIMUM ACCESS (MAS)POSTERIOR LUMBAR INTERBODY FUSION (PLIF) 2 LEVEL N/A 04/12/2015   Procedure: Lumbar Three-Five Decompression, Pedicle Screw Fixation, and Posteriolateral Arthrodesis;  Surgeon: Maeola Harman, MD;  Location: [facility];  Service: Neurosurgery;  Laterality: N/A;  L3-4 L4-5 Maximum access posterior lumbar fusion, possible interbodies and resection of synovial cyst at L4-5   MITRAL VALVE REPAIR  01/20/2013   Gore-tex cords to P1, P2, and P3. Magic suture to posterior medial commisure, #30 Physio 1 ring. Done in [place]   TONSILLECTOMY     about 1940   TRICUSPID VALVE SURGERY  01/20/2013   #28 TriAd ring done in [place]    Allergies  Allergen Reactions   Claritin [Loratadine] Other (See Comments)    Listed on MAR Unknown reaction   Codeine Other (See Comments)    "just don't take it well"   Gabapentin Other (See Comments)    Dizziness    Molds & Smuts Other (See Comments)    Dust.  Reaction is not listed on MAR    Pollen Extract Swelling    Grass   Bee Venom Swelling and Rash   Wasp Venom Swelling and Rash    Outpatient Encounter Medications as of 12/28/2022  Medication Sig   acetaminophen (TYLENOL) 500 MG tablet Take 1,000 mg by mouth in the morning and at bedtime.   amoxicillin (AMOXIL) 500 MG capsule Take 500 mg by mouth as needed. (Patient not taking: Reported on 12/14/2022)   apixaban (ELIQUIS) 2.5 MG TABS tablet Take 1 tablet (2.5 mg total) by mouth 2 (two) times daily.   atorvastatin (LIPITOR) 20 MG tablet TAKE 1 TABLET DAILY   Cholecalciferol (VITAMIN D) 50 MCG (2000 UT) CAPS Take 2,000 Units by mouth in the morning.   cloNIDine (CATAPRES) 0.1 MG tablet Take 1 tablet (0.1 mg total) by mouth daily as needed. If SBP >180 only   Iron,  Ferrous Sulfate, 325 (65 Fe) MG TABS Take 325 mg by mouth daily.   leflunomide (ARAVA) 20 MG tablet Take 20 mg by mouth in the morning.   levalbuterol (XOPENEX) 0.63 MG/3ML nebulizer solution Take 0.63 mg by nebulization 2 (two) times daily as needed for wheezing or shortness of breath.   levothyroxine (SYNTHROID) 100 MCG tablet Take 1 tablet (100 mcg total) by mouth daily before breakfast.   metoprolol succinate (TOPROL-XL) 100 MG 24 hr tablet Take 1 tablet (100 mg total) by mouth daily. Take with or immediately following a meal.   mirtazapine (REMERON) 15 MG tablet TAKE  1 TABLET AT BEDTIME   Multiple Vitamin (MULTIVITAMIN ADULT PO) Take 1 tablet by mouth every morning.   Multiple Vitamins-Minerals (PRESERVISION AREDS 2) CAPS Take 1 capsule by mouth in the morning and at bedtime.   omeprazole (PRILOSEC) 40 MG capsule TAKE 1 CAPSULE DAILY   ondansetron (ZOFRAN) 4 MG tablet Take 4 mg by mouth every 6 (six) hours as needed for nausea or vomiting.   PSYLLIUM HUSK PO Take 2 capsules by mouth every morning.   sodium chloride 1 g tablet Take 1 g by mouth daily.   valsartan (DIOVAN) 160 MG tablet Take 160 mg by mouth daily.   Vibegron (GEMTESA) 75 MG TABS Take 75 mg by mouth daily.   vitamin B-12 (CYANOCOBALAMIN) 1000 MCG tablet Take 1,000 mcg by mouth in the morning.   No facility-administered encounter medications on file as of 12/28/2022.    Review of Systems  Constitutional:  Negative for activity change, appetite change, chills, diaphoresis, fatigue, fever and unexpected weight change.  HENT:  Negative for congestion.   Respiratory:  Positive for shortness of breath (on exertion). Negative for cough and wheezing.   Cardiovascular:  Positive for leg swelling. Negative for chest pain and palpitations.  Gastrointestinal:  Negative for abdominal distention, abdominal pain, constipation and diarrhea.  Genitourinary:  Negative for difficulty urinating and dysuria.  Musculoskeletal:  Positive for  arthralgias and gait problem. Negative for back pain, joint swelling and myalgias.  Skin:  Positive for wound.  Neurological:  Negative for dizziness, tremors, seizures, syncope, facial asymmetry, speech difficulty, weakness, light-headedness, numbness and headaches.  Psychiatric/Behavioral:  Positive for confusion. Negative for agitation and behavioral problems.     Immunization History  Administered Date(s) Administered   Fluad Quad(high Dose 65+) 01/05/2019, 12/07/2021   H1N1 04/07/2008   Influenza Whole 12/29/2008, 12/22/2009   Influenza, High Dose Seasonal PF 01/15/2012, 12/23/2014, 01/22/2020   Influenza,inj,Quad PF,6+ Mos 01/22/2014, 12/10/2016, 01/17/2018   Influenza,trivalent, recombinat, inj, PF 01/05/2016   Influenza-Unspecified 04/07/2008, 12/15/2014, 01/13/2021   Moderna Covid-19 Vaccine Bivalent Booster 6yrs & up 01/29/2022   Moderna Sars-Covid-2 Vaccination 04/07/2019, 05/05/2019, 02/09/2020   PFIZER(Purple Top)SARS-COV-2 Vaccination 01/06/2021   PPD Test 07/17/2010, 04/19/2015, 04/25/2015   Pfizer Covid-19 Vaccine Bivalent Booster 79yrs & up 07/19/2022   Pneumococcal Conjugate-13 11/10/2013   Pneumococcal Polysaccharide-23 12/27/2010, 12/05/2016   RSV,unspecified 03/28/2022   Tdap 04/15/2017   Zoster Recombinant(Shingrix) 04/09/2017, 06/18/2017   Pertinent  Health Maintenance Due  Topic Date Due   INFLUENZA VACCINE  10/25/2022   DEXA SCAN  Completed      03/22/2022   11:45 AM 05/21/2022    1:06 PM 06/06/2022    4:55 PM 07/09/2022    1:44 PM 08/29/2022    3:31 PM  Fall Risk  Falls in the past year? 1 1 1  0 1  Was there an injury with Fall? 1 1 1  0 1  Fall Risk Category Calculator 3 3 3  0 2  Fall Risk Category (Retired) High      Patient at Risk for Falls Due to History of fall(s) History of fall(s);Impaired balance/gait;Impaired mobility History of fall(s);Impaired balance/gait;Impaired mobility History of fall(s);Impaired balance/gait;Impaired mobility   Fall  risk Follow up Falls evaluation completed Falls evaluation completed Falls evaluation completed;Education provided;Falls prevention discussed Falls evaluation completed Falls evaluation completed   Functional Status Survey:    Vitals:   12/29/22 0831  BP: (!) 163/85  Pulse: 64    There is no height or weight on file to calculate BMI. Physical Exam Vitals  and nursing note reviewed.  Constitutional:      General: She is not in acute distress.    Appearance: She is not diaphoretic.  HENT:     Head: Normocephalic and atraumatic.  Neck:     Vascular: No JVD.  Cardiovascular:     Rate and Rhythm: Normal rate. Rhythm irregular.     Heart sounds: No murmur heard. Pulmonary:     Effort: Pulmonary effort is normal. No respiratory distress.     Breath sounds: Normal breath sounds. No wheezing.  Abdominal:     General: Bowel sounds are normal. There is no distension.     Palpations: Abdomen is soft.     Tenderness: There is no abdominal tenderness.  Musculoskeletal:     Comments: Trace edema to BLE  Skin:    General: Skin is warm and dry.     Comments: Left second toe with healing wound no drainage or redness.  Wound bed pink.  Neurological:     Mental Status: She is alert. Mental status is at baseline.     Labs reviewed: Recent Labs    01/30/22 1809 02/03/22 0000 03/10/22 0820 03/15/22 0000 06/22/22 1619 08/06/22 1327 10/18/22 0000 10/30/22 0000 11/05/22 0000  NA 132*   < > 131*   < > 136 137 133* 137 133*  K 4.3   < > 4.2   < > 4.7 4.1 4.1 4.4 4.5  CL 100   < > 98   < > 99 106 97* 104 99  CO2 22   < > 26   < > 22 23 26* 18 25*  GLUCOSE 116*   < > 97  --  100* 106*  --   --   --   BUN 22   < > 26*   < > 18 19 25* 18 21  CREATININE 0.88   < > 0.75   < > 0.72 0.76 1.0 0.8 0.9  CALCIUM 9.4   < > 8.9   < > 9.5 9.4 9.5 8.6* 9.2  MG 1.9  --   --   --   --   --   --   --   --    < > = values in this interval not displayed.   Recent Labs    03/06/22 1306 03/07/22 0100  10/18/22 0000  AST 18 22 17   ALT 17 16 16   ALKPHOS 76 66 90  BILITOT 0.6 0.7  --   PROT 6.6 5.8*  --   ALBUMIN 3.8 3.1* 4.4   Recent Labs    06/05/22 1432 08/06/22 1327 10/11/22 1405 10/18/22 0000 10/30/22 0000 11/05/22 0000  WBC 8.9 7.6 9.9 8.1 7.5 8.4  NEUTROABS 5.8  --  5.7 4.90  --   --   HGB 11.4* 11.7* 11.9* 12.2 11.2* 11.6*  HCT 33.9* 35.2* 35.6* 36 33* 34*  MCV 100.6* 100.9* 102.6*  --   --   --   PLT 187 170 193 188 177 169   Lab Results  Component Value Date   TSH 5.435 (H) 10/11/2022   Lab Results  Component Value Date   HGBA1C 5.5 01/12/2013   Lab Results  Component Value Date   CHOL 115 02/02/2021   HDL 48 02/02/2021   LDLCALC 49 02/02/2021   TRIG 91 02/02/2021    Significant Diagnostic Results in last 30 days:  No results found.  Assessment/Plan  1. Vascular dementia without behavioral disturbance (HCC) Progressing over time and now  living in skilled care Seems to be adjusting well  2. Frequent PVCs Seen by cardiology, taken off digoxin.   3. Permanent atrial fibrillation (HCC) Currently on metoprolol 100 mg daily HR improved Started back on Eliquis, no s/e  4. Gait instability Using walker and knee brace to prevent falls.   5. Essential hypertension Slightly above goal at times Monitor manually and report if consistently elevated.   6. Hypothyroidism, unspecified type Lab Results  Component Value Date   TSH 5.435 (H) 10/11/2022  Repeat will be done in Nov due to manufacturer change of levothyroxine.    7. Mixed hyperlipidemia Lab Results  Component Value Date   CHOL 115 02/02/2021   HDL 48 02/02/2021   LDLCALC 49 02/02/2021   TRIG 91 02/02/2021   Would repeat with TSH  8. Open wound of toe, subsequent encounter Improved Dressing changes per nursing staff.    Family/ staff Communication: nurse  Labs/tests ordered: lipid, BMP, CBC, TSH in Nov

## 2022-12-31 ENCOUNTER — Encounter: Payer: Self-pay | Admitting: Internal Medicine

## 2022-12-31 ENCOUNTER — Non-Acute Institutional Stay (SKILLED_NURSING_FACILITY): Payer: Medicare Other | Admitting: Internal Medicine

## 2022-12-31 DIAGNOSIS — R0602 Shortness of breath: Secondary | ICD-10-CM

## 2022-12-31 DIAGNOSIS — E039 Hypothyroidism, unspecified: Secondary | ICD-10-CM | POA: Diagnosis not present

## 2022-12-31 DIAGNOSIS — F0150 Vascular dementia without behavioral disturbance: Secondary | ICD-10-CM | POA: Diagnosis not present

## 2022-12-31 DIAGNOSIS — I10 Essential (primary) hypertension: Secondary | ICD-10-CM

## 2022-12-31 DIAGNOSIS — I4821 Permanent atrial fibrillation: Secondary | ICD-10-CM | POA: Diagnosis not present

## 2022-12-31 LAB — CBC AND DIFFERENTIAL
HCT: 31 — AB (ref 36–46)
Hemoglobin: 10.6 — AB (ref 12.0–16.0)
Neutrophils Absolute: 3.7
Platelets: 157 10*3/uL (ref 150–400)
WBC: 6.4

## 2022-12-31 LAB — HEPATIC FUNCTION PANEL
ALT: 36 U/L — AB (ref 7–35)
AST: 26 (ref 13–35)
Alkaline Phosphatase: 104 (ref 25–125)
Bilirubin, Total: 0.6

## 2022-12-31 LAB — BASIC METABOLIC PANEL
BUN: 18 (ref 4–21)
CO2: 22 (ref 13–22)
Chloride: 104 (ref 99–108)
Creatinine: 0.9 (ref 0.5–1.1)
Glucose: 101
Potassium: 4 meq/L (ref 3.5–5.1)
Sodium: 135 — AB (ref 137–147)

## 2022-12-31 LAB — COMPREHENSIVE METABOLIC PANEL
Albumin: 4.1 (ref 3.5–5.0)
Calcium: 8.8 (ref 8.7–10.7)
eGFR: 63

## 2022-12-31 LAB — CBC: RBC: 3.01 — AB (ref 3.87–5.11)

## 2022-12-31 NOTE — Progress Notes (Signed)
[facility] Room Number: 129-A Place of Service:  SNF [phone]) Provider:  Merian Capron    Patient Care Team: Mahlon Gammon, MD as PCP - General (Internal Medicine) Croitoru, Rachelle Hora, MD as PCP - Cardiology (Cardiology) Drema Dallas, DO as Consulting Physician (Neurology)  Extended Emergency Contact Information Primary Emergency Contact: [name],[name] Address: [address]          [place], [place] [address] Darden [place] of [place] Home Phone: [phone] Mobile Phone: [phone] Relation: Daughter Secondary Emergency Contact: Hollie Salk Address: [address]          [place], [place] [address] Darden Amber of [place] Mobile Phone: [phone] Relation: Son  Code Status:  DNR Goals of care: Advanced Directive information    12/31/2022   12:14 PM  Advanced Directives  Does Patient Have a Medical Advance Directive? Yes  Type of Estate agent of [place];Living will;Out of facility DNR (pink MOST or yellow form)  Does patient want to make changes to medical advance directive? No - Patient declined  Copy of Healthcare Power of Attorney in Chart? Yes - validated most recent copy scanned in chart (See row information)     Chief Complaint  Patient presents with   Acute Visit    HPI:  Pt is a 89 y.o. female seen today for an acute visit for SOB on Exertion  Lives in [place] in [place]  Has A Fib on Eliquis now Hypertension and HLD H/o TIA  Cerebral Amyloid angiopathy on MRI Colon Cancer treated with Immunotherapy No Surgery in Remission  Anemia  Cognitive impairment MMSE 15/30 Rheumatoid arthritis on Arava  Noticed by Nurses and Daughter to be SOB on walking Some dry Cough No Chest pain or fever or nausea or vomiting  Weight seems stable      Past Medical History:  Diagnosis Date   Allergy    seasonal   Anemia    Anxiety    Arthritis    Back   Asymptomatic menopausal state    Atrial  fibrillation (HCC)    Cancer (HCC) 2006   Colon.  Basal Cell Skin cancer- right arm   Cataract    removed bilateral   Chronic atrial fibrillation (HCC)    Colon cancer (HCC)    Constipation due to pain medication therapy    after heart surgery   Deficiency of other specified B group vitamins    Diplopia    Disorientation, unspecified    Dysrhythmia    PAF   GERD (gastroesophageal reflux disease)    Heart murmur    Hyperlipidemia    Hypertension    Hypothyroidism    Malignant neoplasm of colon, unspecified (HCC)    Other specified diseases of blood and blood-forming organs    Other specified disorders of bone density and structure, unspecified forearm    Other thrombophilia (HCC)    per matrix   Overactive bladder    per Matrix   Personal history of other malignant neoplasm of large intestine    Presbycusis, bilateral    per matrix   RA (rheumatoid arthritis) (HCC)    Restless leg    Seizures (HCC)    after Heart Surgey   Stroke (HCC)    TIA- found by neurologist after    Valgus deformity, not elsewhere classified, right knee    per matrix   Vascular dementia, mild, without behavioral disturbance, psychotic disturbance, mood disturbance, and anxiety (HCC)    per matrix  Past Surgical History:  Procedure Laterality Date   ABDOMINAL HYSTERECTOMY  1970   Partial    COLON RESECTION  2006   cancer   COLON SURGERY     COLONOSCOPY     EYE SURGERY Bilateral    Cataract   MAXIMUM ACCESS (MAS)POSTERIOR LUMBAR INTERBODY FUSION (PLIF) 2 LEVEL N/A 04/12/2015   Procedure: Lumbar Three-Five Decompression, Pedicle Screw Fixation, and Posteriolateral Arthrodesis;  Surgeon: Maeola Harman, MD;  Location: [facility];  Service: Neurosurgery;  Laterality: N/A;  L3-4 L4-5 Maximum access posterior lumbar fusion, possible interbodies and resection of synovial cyst at L4-5   MITRAL VALVE REPAIR  01/20/2013   Gore-tex cords to P1, P2, and P3. Magic suture to posterior medial commisure, #30  Physio 1 ring. Done in [place]   TONSILLECTOMY     about 1940   TRICUSPID VALVE SURGERY  01/20/2013   #28 TriAd ring done in [place]    Allergies  Allergen Reactions   Claritin [Loratadine] Other (See Comments)    Listed on MAR Unknown reaction   Codeine Other (See Comments)    "just don't take it well"   Gabapentin Other (See Comments)    Dizziness    Molds & Smuts Other (See Comments)    Dust.  Reaction is not listed on MAR    Pollen Extract Swelling    Grass   Bee Venom Swelling and Rash   Wasp Venom Swelling and Rash    Allergies as of 12/31/2022       Reactions   Claritin [loratadine] Other (See Comments)   Listed on MAR Unknown reaction   Codeine Other (See Comments)   "just don't take it well"   Gabapentin Other (See Comments)   Dizziness    Molds & Smuts Other (See Comments)   Dust.  Reaction is not listed on MAR    Pollen Extract Swelling   Grass   Bee Venom Swelling, Rash   Wasp Venom Swelling, Rash        Medication List        Accurate as of December 31, 2022 12:15 PM. If you have any questions, ask your nurse or doctor.          acetaminophen 500 MG tablet Commonly known as: TYLENOL Take 1,000 mg by mouth in the morning and at bedtime.   acetaminophen 500 MG tablet Commonly known as: TYLENOL Take 1,000 mg by mouth as needed.   amoxicillin 500 MG capsule Commonly known as: AMOXIL Take 500 mg by mouth as needed.   apixaban 2.5 MG Tabs tablet Commonly known as: ELIQUIS Take 1 tablet (2.5 mg total) by mouth 2 (two) times daily.   atorvastatin 20 MG tablet Commonly known as: LIPITOR TAKE 1 TABLET DAILY   cloNIDine 0.1 MG tablet Commonly known as: CATAPRES Take 1 tablet (0.1 mg total) by mouth daily as needed. If SBP >180 only   cyanocobalamin 1000 MCG tablet Commonly known as: VITAMIN B12 Take 1,000 mcg by mouth in the morning.   Gemtesa 75 MG Tabs Generic drug: Vibegron Take 75 mg by mouth daily.   Iron (Ferrous Sulfate)  325 (65 Fe) MG Tabs Take 325 mg by mouth daily.   leflunomide 20 MG tablet Commonly known as: ARAVA Take 20 mg by mouth in the morning.   levalbuterol 0.63 MG/3ML nebulizer solution Commonly known as: XOPENEX Take 0.63 mg by nebulization 2 (two) times daily as needed for wheezing or shortness of breath.   levothyroxine 100 MCG tablet Commonly  known as: Synthroid Take 1 tablet (100 mcg total) by mouth daily before breakfast.   metoprolol succinate 100 MG 24 hr tablet Commonly known as: TOPROL-XL Take 1 tablet (100 mg total) by mouth daily. Take with or immediately following a meal.   mirtazapine 15 MG tablet Commonly known as: REMERON TAKE 1 TABLET AT BEDTIME   MULTIVITAMIN ADULT PO Take 1 tablet by mouth every morning.   omeprazole 40 MG capsule Commonly known as: PRILOSEC TAKE 1 CAPSULE DAILY   ondansetron 4 MG tablet Commonly known as: ZOFRAN Take 4 mg by mouth every 6 (six) hours as needed for nausea or vomiting.   polyethylene glycol 17 g packet Commonly known as: MIRALAX / GLYCOLAX Take 17 g by mouth as needed.   PreserVision AREDS 2 Caps Take 1 capsule by mouth in the morning and at bedtime.   PSYLLIUM HUSK PO Take 2 capsules by mouth every morning.   sodium chloride 1 g tablet Take 1 g by mouth daily.   valsartan 160 MG tablet Commonly known as: DIOVAN Take 160 mg by mouth daily.   Vitamin D 50 MCG (2000 UT) Caps Take 2,000 Units by mouth in the morning.        Review of Systems  Constitutional:  Positive for activity change. Negative for appetite change.  HENT: Negative.    Respiratory:  Positive for shortness of breath. Negative for cough.   Cardiovascular:  Positive for leg swelling.  Gastrointestinal:  Negative for constipation.  Genitourinary: Negative.   Musculoskeletal:  Positive for gait problem. Negative for arthralgias and myalgias.  Skin: Negative.   Neurological:  Negative for dizziness and weakness.  Psychiatric/Behavioral:   Positive for confusion. Negative for dysphoric mood and sleep disturbance.     Immunization History  Administered Date(s) Administered   Fluad Quad(high Dose 65+) 01/05/2019, 12/07/2021   H1N1 04/07/2008   Influenza Whole 12/29/2008, 12/22/2009   Influenza, High Dose Seasonal PF 01/15/2012, 12/23/2014, 01/22/2020   Influenza,inj,Quad PF,6+ Mos 01/22/2014, 12/10/2016, 01/17/2018   Influenza,trivalent, recombinat, inj, PF 01/05/2016   Influenza-Unspecified 04/07/2008, 12/15/2014, 01/13/2021   Moderna Covid-19 Vaccine Bivalent Booster 48yrs & up 01/29/2022   Moderna Sars-Covid-2 Vaccination 04/07/2019, 05/05/2019, 02/09/2020   PFIZER(Purple Top)SARS-COV-2 Vaccination 01/06/2021   PPD Test 07/17/2010, 04/19/2015, 04/25/2015   Pfizer Covid-19 Vaccine Bivalent Booster 26yrs & up 07/19/2022   Pneumococcal Conjugate-13 11/10/2013   Pneumococcal Polysaccharide-23 12/27/2010, 12/05/2016   RSV,unspecified 03/28/2022   Tdap 04/15/2017   Zoster Recombinant(Shingrix) 04/09/2017, 06/18/2017   Pertinent  Health Maintenance Due  Topic Date Due   INFLUENZA VACCINE  10/25/2022   DEXA SCAN  Completed      03/22/2022   11:45 AM 05/21/2022    1:06 PM 06/06/2022    4:55 PM 07/09/2022    1:44 PM 08/29/2022    3:31 PM  Fall Risk  Falls in the past year? 1 1 1  0 1  Was there an injury with Fall? 1 1 1  0 1  Fall Risk Category Calculator 3 3 3  0 2  Fall Risk Category (Retired) High      Patient at Risk for Falls Due to History of fall(s) History of fall(s);Impaired balance/gait;Impaired mobility History of fall(s);Impaired balance/gait;Impaired mobility History of fall(s);Impaired balance/gait;Impaired mobility   Fall risk Follow up Falls evaluation completed Falls evaluation completed Falls evaluation completed;Education provided;Falls prevention discussed Falls evaluation completed Falls evaluation completed   Functional Status Survey:    Vitals:   12/31/22 1209  BP: (!) 145/95  Pulse: (!) 106  Resp: 19  Temp: 97.9 F (36.6 C)  SpO2: 93%  Weight: 128 lb 3.2 oz (58.2 kg)  Height: 5\' 2"  (1.575 m)   Body mass index is 23.45 kg/m. Physical Exam Vitals reviewed.  Constitutional:      Appearance: Normal appearance.  HENT:     Head: Normocephalic.     Nose: Nose normal.     Mouth/Throat:     Mouth: Mucous membranes are moist.     Pharynx: Oropharynx is clear.  Eyes:     Pupils: Pupils are equal, round, and reactive to light.  Cardiovascular:     Rate and Rhythm: Normal rate. Rhythm irregular.     Pulses: Normal pulses.     Heart sounds: Normal heart sounds. No murmur heard. Pulmonary:     Effort: Pulmonary effort is normal.     Breath sounds: Normal breath sounds.     Comments: Few rales in the Bases Abdominal:     General: Abdomen is flat. Bowel sounds are normal.     Palpations: Abdomen is soft.  Musculoskeletal:        General: Swelling present.     Cervical back: Neck supple.     Comments: Mild Swelling  Skin:    General: Skin is warm.  Neurological:     General: No focal deficit present.     Mental Status: She is alert.  Psychiatric:        Mood and Affect: Mood normal.        Thought Content: Thought content normal.     Labs reviewed: Recent Labs    01/30/22 1809 02/03/22 0000 03/10/22 0820 03/15/22 0000 06/22/22 1619 08/06/22 1327 10/18/22 0000 10/30/22 0000 11/05/22 0000  NA 132*   < > 131*   < > 136 137 133* 137 133*  K 4.3   < > 4.2   < > 4.7 4.1 4.1 4.4 4.5  CL 100   < > 98   < > 99 106 97* 104 99  CO2 22   < > 26   < > 22 23 26* 18 25*  GLUCOSE 116*   < > 97  --  100* 106*  --   --   --   BUN 22   < > 26*   < > 18 19 25* 18 21  CREATININE 0.88   < > 0.75   < > 0.72 0.76 1.0 0.8 0.9  CALCIUM 9.4   < > 8.9   < > 9.5 9.4 9.5 8.6* 9.2  MG 1.9  --   --   --   --   --   --   --   --    < > = values in this interval not displayed.   Recent Labs    03/06/22 1306 03/07/22 0100 10/18/22 0000  AST 18 22 17   ALT 17 16 16   ALKPHOS 76 66  90  BILITOT 0.6 0.7  --   PROT 6.6 5.8*  --   ALBUMIN 3.8 3.1* 4.4   Recent Labs    06/05/22 1432 08/06/22 1327 10/11/22 1405 10/18/22 0000 10/30/22 0000 11/05/22 0000  WBC 8.9 7.6 9.9 8.1 7.5 8.4  NEUTROABS 5.8  --  5.7 4.90  --   --   HGB 11.4* 11.7* 11.9* 12.2 11.2* 11.6*  HCT 33.9* 35.2* 35.6* 36 33* 34*  MCV 100.6* 100.9* 102.6*  --   --   --   PLT 187 170 193 188 177 169  Lab Results  Component Value Date   TSH 5.435 (H) 10/11/2022   Lab Results  Component Value Date   HGBA1C 5.5 01/12/2013   Lab Results  Component Value Date   CHOL 115 02/02/2021   HDL 48 02/02/2021   LDLCALC 49 02/02/2021   TRIG 91 02/02/2021    Significant Diagnostic Results in last 30 days:  No results found.  Assessment/Plan 1. SOB (shortness of breath) on exertion Chest Xray CBC,CMP,BNP ordered Labs came Back Her Hgb is 10.6 Bun and Creat are WNL BNP elevated at 957 Chest Xray was negative for any acute changes  Refer Back to Cardiology to discuss her SOB Will Try Low dose of Lasix 20 mg every day with Potassium for 1 week Repeat BMP in 1 week 2. Permanent atrial fibrillation (HCC) She is in A Fib Taken Off Digoxin Back on Eliquis She does go in and out of A Fib Possible cause of her SOB  3. Vascular dementia without behavioral disturbance (HCC) Doing well in SNF Needs help with her ADLS 4. Essential hypertension Diovan and TOprol  5. Hypothyroidism, unspecified type Slightly High in 7/24 Repeat TSH   Family/ staff Communication:   Labs/tests ordered:  BMP and TSH in 1 week

## 2023-01-01 ENCOUNTER — Telehealth: Payer: Self-pay | Admitting: Cardiovascular Disease

## 2023-01-01 ENCOUNTER — Encounter: Payer: Self-pay | Admitting: Internal Medicine

## 2023-01-01 DIAGNOSIS — M6389 Disorders of muscle in diseases classified elsewhere, multiple sites: Secondary | ICD-10-CM | POA: Diagnosis not present

## 2023-01-01 DIAGNOSIS — R41841 Cognitive communication deficit: Secondary | ICD-10-CM | POA: Diagnosis not present

## 2023-01-01 DIAGNOSIS — R2681 Unsteadiness on feet: Secondary | ICD-10-CM | POA: Diagnosis not present

## 2023-01-01 DIAGNOSIS — F01A0 Vascular dementia, mild, without behavioral disturbance, psychotic disturbance, mood disturbance, and anxiety: Secondary | ICD-10-CM | POA: Diagnosis not present

## 2023-01-01 DIAGNOSIS — R278 Other lack of coordination: Secondary | ICD-10-CM | POA: Diagnosis not present

## 2023-01-01 DIAGNOSIS — M6281 Muscle weakness (generalized): Secondary | ICD-10-CM | POA: Diagnosis not present

## 2023-01-01 DIAGNOSIS — N390 Urinary tract infection, site not specified: Secondary | ICD-10-CM | POA: Diagnosis not present

## 2023-01-01 NOTE — Telephone Encounter (Signed)
Pt c/o medication issue:  1. Name of Medication: digoxin (LANOXIN) 0.125 MG tablet   2. How are you currently taking this medication (dosage and times per day)? Not taking  3. Are you having a reaction (difficulty breathing--STAT)? No  4. What is your medication issue? RN Babette Relic is calling because the medication was discontinued. RN Tammy stated the patient is very SOB and tired. RN Tammy stated the patient's pulse is 80-100. Patient had BMP labs yesterday and the results were high. RN Babette Relic is requesting we call her back. Please advise.

## 2023-01-01 NOTE — Telephone Encounter (Signed)
Advised nurse of information, she will get back with office if feels patient needs to be seen.

## 2023-01-01 NOTE — Telephone Encounter (Signed)
Kathy Velez states this patient has been under her care approx 2 weeks.  States patient has SOB and fatigue. Patient had stat Chest xray today which is clear.  STAT BMP today with result. 957 and was 238 on August 12th.  She states Apical HR  runs at times 70-120. Weight today 131.7  She was 130 at OV 3 weks ago, so no change.  She notes concerns of patient no longer on digoxin.  Referred to OV not and reason that provider discontinued this medication    Last OV notes discuss chronic Afib and SOB. She states patient is fatigued, SOB and does not want to get up today Please advise if any changes

## 2023-01-02 DIAGNOSIS — M0579 Rheumatoid arthritis with rheumatoid factor of multiple sites without organ or systems involvement: Secondary | ICD-10-CM | POA: Diagnosis not present

## 2023-01-02 DIAGNOSIS — M1991 Primary osteoarthritis, unspecified site: Secondary | ICD-10-CM | POA: Diagnosis not present

## 2023-01-02 DIAGNOSIS — M5136 Other intervertebral disc degeneration, lumbar region with discogenic back pain only: Secondary | ICD-10-CM | POA: Diagnosis not present

## 2023-01-02 DIAGNOSIS — M8580 Other specified disorders of bone density and structure, unspecified site: Secondary | ICD-10-CM | POA: Diagnosis not present

## 2023-01-02 DIAGNOSIS — Z6824 Body mass index (BMI) 24.0-24.9, adult: Secondary | ICD-10-CM | POA: Diagnosis not present

## 2023-01-02 DIAGNOSIS — Z79899 Other long term (current) drug therapy: Secondary | ICD-10-CM | POA: Diagnosis not present

## 2023-01-03 DIAGNOSIS — R41841 Cognitive communication deficit: Secondary | ICD-10-CM | POA: Diagnosis not present

## 2023-01-03 DIAGNOSIS — R278 Other lack of coordination: Secondary | ICD-10-CM | POA: Diagnosis not present

## 2023-01-03 DIAGNOSIS — F01A0 Vascular dementia, mild, without behavioral disturbance, psychotic disturbance, mood disturbance, and anxiety: Secondary | ICD-10-CM | POA: Diagnosis not present

## 2023-01-03 DIAGNOSIS — M6281 Muscle weakness (generalized): Secondary | ICD-10-CM | POA: Diagnosis not present

## 2023-01-03 DIAGNOSIS — R2681 Unsteadiness on feet: Secondary | ICD-10-CM | POA: Diagnosis not present

## 2023-01-03 DIAGNOSIS — N390 Urinary tract infection, site not specified: Secondary | ICD-10-CM | POA: Diagnosis not present

## 2023-01-03 DIAGNOSIS — M6389 Disorders of muscle in diseases classified elsewhere, multiple sites: Secondary | ICD-10-CM | POA: Diagnosis not present

## 2023-01-04 DIAGNOSIS — R41841 Cognitive communication deficit: Secondary | ICD-10-CM | POA: Diagnosis not present

## 2023-01-04 DIAGNOSIS — F01A0 Vascular dementia, mild, without behavioral disturbance, psychotic disturbance, mood disturbance, and anxiety: Secondary | ICD-10-CM | POA: Diagnosis not present

## 2023-01-04 DIAGNOSIS — R2681 Unsteadiness on feet: Secondary | ICD-10-CM | POA: Diagnosis not present

## 2023-01-04 DIAGNOSIS — N390 Urinary tract infection, site not specified: Secondary | ICD-10-CM | POA: Diagnosis not present

## 2023-01-04 DIAGNOSIS — M6281 Muscle weakness (generalized): Secondary | ICD-10-CM | POA: Diagnosis not present

## 2023-01-04 DIAGNOSIS — M6389 Disorders of muscle in diseases classified elsewhere, multiple sites: Secondary | ICD-10-CM | POA: Diagnosis not present

## 2023-01-04 DIAGNOSIS — R278 Other lack of coordination: Secondary | ICD-10-CM | POA: Diagnosis not present

## 2023-01-07 DIAGNOSIS — E039 Hypothyroidism, unspecified: Secondary | ICD-10-CM | POA: Diagnosis not present

## 2023-01-07 DIAGNOSIS — R41841 Cognitive communication deficit: Secondary | ICD-10-CM | POA: Diagnosis not present

## 2023-01-07 DIAGNOSIS — R2681 Unsteadiness on feet: Secondary | ICD-10-CM | POA: Diagnosis not present

## 2023-01-07 DIAGNOSIS — M6281 Muscle weakness (generalized): Secondary | ICD-10-CM | POA: Diagnosis not present

## 2023-01-07 DIAGNOSIS — F01A0 Vascular dementia, mild, without behavioral disturbance, psychotic disturbance, mood disturbance, and anxiety: Secondary | ICD-10-CM | POA: Diagnosis not present

## 2023-01-07 DIAGNOSIS — R0602 Shortness of breath: Secondary | ICD-10-CM | POA: Diagnosis not present

## 2023-01-07 DIAGNOSIS — N390 Urinary tract infection, site not specified: Secondary | ICD-10-CM | POA: Diagnosis not present

## 2023-01-07 DIAGNOSIS — R278 Other lack of coordination: Secondary | ICD-10-CM | POA: Diagnosis not present

## 2023-01-07 DIAGNOSIS — M6389 Disorders of muscle in diseases classified elsewhere, multiple sites: Secondary | ICD-10-CM | POA: Diagnosis not present

## 2023-01-07 LAB — COMPREHENSIVE METABOLIC PANEL
Calcium: 8.9 (ref 8.7–10.7)
eGFR: 62

## 2023-01-07 LAB — BASIC METABOLIC PANEL
BUN: 23 — AB (ref 4–21)
CO2: 23 — AB (ref 13–22)
Chloride: 102 (ref 99–108)
Creatinine: 0.9 (ref 0.5–1.1)
Glucose: 89
Potassium: 4 meq/L (ref 3.5–5.1)
Sodium: 137 (ref 137–147)

## 2023-01-07 LAB — TSH: TSH: 0.28 — AB (ref 0.41–5.90)

## 2023-01-09 DIAGNOSIS — R41841 Cognitive communication deficit: Secondary | ICD-10-CM | POA: Diagnosis not present

## 2023-01-09 DIAGNOSIS — F01A0 Vascular dementia, mild, without behavioral disturbance, psychotic disturbance, mood disturbance, and anxiety: Secondary | ICD-10-CM | POA: Diagnosis not present

## 2023-01-10 DIAGNOSIS — M6281 Muscle weakness (generalized): Secondary | ICD-10-CM | POA: Diagnosis not present

## 2023-01-10 DIAGNOSIS — R2681 Unsteadiness on feet: Secondary | ICD-10-CM | POA: Diagnosis not present

## 2023-01-10 DIAGNOSIS — F01A0 Vascular dementia, mild, without behavioral disturbance, psychotic disturbance, mood disturbance, and anxiety: Secondary | ICD-10-CM | POA: Diagnosis not present

## 2023-01-10 DIAGNOSIS — N390 Urinary tract infection, site not specified: Secondary | ICD-10-CM | POA: Diagnosis not present

## 2023-01-10 DIAGNOSIS — M6389 Disorders of muscle in diseases classified elsewhere, multiple sites: Secondary | ICD-10-CM | POA: Diagnosis not present

## 2023-01-10 DIAGNOSIS — R278 Other lack of coordination: Secondary | ICD-10-CM | POA: Diagnosis not present

## 2023-01-11 DIAGNOSIS — F01A0 Vascular dementia, mild, without behavioral disturbance, psychotic disturbance, mood disturbance, and anxiety: Secondary | ICD-10-CM | POA: Diagnosis not present

## 2023-01-11 DIAGNOSIS — R41841 Cognitive communication deficit: Secondary | ICD-10-CM | POA: Diagnosis not present

## 2023-01-14 DIAGNOSIS — R278 Other lack of coordination: Secondary | ICD-10-CM | POA: Diagnosis not present

## 2023-01-14 DIAGNOSIS — N390 Urinary tract infection, site not specified: Secondary | ICD-10-CM | POA: Diagnosis not present

## 2023-01-14 DIAGNOSIS — R41841 Cognitive communication deficit: Secondary | ICD-10-CM | POA: Diagnosis not present

## 2023-01-14 DIAGNOSIS — F01A0 Vascular dementia, mild, without behavioral disturbance, psychotic disturbance, mood disturbance, and anxiety: Secondary | ICD-10-CM | POA: Diagnosis not present

## 2023-01-14 DIAGNOSIS — R2681 Unsteadiness on feet: Secondary | ICD-10-CM | POA: Diagnosis not present

## 2023-01-14 DIAGNOSIS — M6281 Muscle weakness (generalized): Secondary | ICD-10-CM | POA: Diagnosis not present

## 2023-01-14 DIAGNOSIS — M6389 Disorders of muscle in diseases classified elsewhere, multiple sites: Secondary | ICD-10-CM | POA: Diagnosis not present

## 2023-01-15 ENCOUNTER — Other Ambulatory Visit: Payer: Self-pay | Admitting: Emergency Medicine

## 2023-01-15 ENCOUNTER — Non-Acute Institutional Stay (SKILLED_NURSING_FACILITY): Payer: Self-pay | Admitting: Internal Medicine

## 2023-01-15 ENCOUNTER — Encounter: Payer: Self-pay | Admitting: Internal Medicine

## 2023-01-15 DIAGNOSIS — M6389 Disorders of muscle in diseases classified elsewhere, multiple sites: Secondary | ICD-10-CM | POA: Diagnosis not present

## 2023-01-15 DIAGNOSIS — Z23 Encounter for immunization: Secondary | ICD-10-CM | POA: Diagnosis not present

## 2023-01-15 DIAGNOSIS — R2681 Unsteadiness on feet: Secondary | ICD-10-CM | POA: Diagnosis not present

## 2023-01-15 DIAGNOSIS — I4821 Permanent atrial fibrillation: Secondary | ICD-10-CM

## 2023-01-15 DIAGNOSIS — R350 Frequency of micturition: Secondary | ICD-10-CM

## 2023-01-15 DIAGNOSIS — F01A0 Vascular dementia, mild, without behavioral disturbance, psychotic disturbance, mood disturbance, and anxiety: Secondary | ICD-10-CM | POA: Diagnosis not present

## 2023-01-15 DIAGNOSIS — Z9889 Other specified postprocedural states: Secondary | ICD-10-CM

## 2023-01-15 DIAGNOSIS — R0602 Shortness of breath: Secondary | ICD-10-CM

## 2023-01-15 DIAGNOSIS — R278 Other lack of coordination: Secondary | ICD-10-CM | POA: Diagnosis not present

## 2023-01-15 DIAGNOSIS — N390 Urinary tract infection, site not specified: Secondary | ICD-10-CM | POA: Diagnosis not present

## 2023-01-15 DIAGNOSIS — M6281 Muscle weakness (generalized): Secondary | ICD-10-CM | POA: Diagnosis not present

## 2023-01-15 NOTE — Progress Notes (Signed)
Location:  [facility] Room Number: 129A Place of Service:  SNF [phone]) Provider:  Mahlon Gammon, MD   Mahlon Gammon, MD  Patient Care Team: Mahlon Gammon, MD as PCP - General (Internal Medicine) Croitoru, Rachelle Hora, MD as PCP - Cardiology (Cardiology) Drema Dallas, DO as Consulting Physician (Neurology)  Extended Emergency Contact Information Primary Emergency Contact: [name],[name] Address: [address]          [place], [place] [address] Darden Amber of [place] Home Phone: [phone] Mobile Phone: [phone] Relation: Daughter Secondary Emergency Contact: Hollie Salk Address: [address]          [place], [place] [address] Darden Amber of [place] Mobile Phone: [phone] Relation: Son  Code Status:  DNR Goals of care: Advanced Directive information    01/15/2023    9:29 AM  Advanced Directives  Does Patient Have a Medical Advance Directive? Yes  Type of Estate agent of [place];Living will;Out of facility DNR (pink MOST or yellow form)  Does patient want to make changes to medical advance directive? No - Patient declined  Copy of Healthcare Power of Attorney in Chart? Yes - validated most recent copy scanned in chart (See row information)     Chief Complaint  Patient presents with   Acute Visit    Patient is being seen for SOB   Immunizations    Patient is due for covid and flu vaccine     HPI:  Pt is a 89 y.o. female seen today for an acute visit for SOB  Lives in [place] [facility] in [place]  Patient is having issues with SOB especially with Exertion  Per Last visit her Chest Xray was negative for any vascular Congestion But her Pro BNP was 957  We had given her trial of Lasix for 1 week  which did help her  But now SOB is back . She also stays little tachycardic HR 95-105 Denies any cough or Chest pain Her weight is slightly up Wt Readings from Last 3 Encounters:  01/15/23 131 lb 11.2 oz (59.7 kg)  12/31/22  128 lb 3.2 oz (58.2 kg)  12/14/22 130 lb 3.2 oz (59.1 kg)     Other history Has A Fib on Eliquis now Recently restarted Hypertension and HLD H/o TIA  Cerebral Amyloid angiopathy on MRI Colon Cancer treated with Immunotherapy No Surgery in Remission  Anemia  Cognitive impairment MMSE 15/30 Rheumatoid arthritis on Arava   Past Medical History:  Diagnosis Date   Allergy    seasonal   Anemia    Anxiety    Arthritis    Back   Asymptomatic menopausal state    Atrial fibrillation (HCC)    Cancer (HCC) 2006   Colon.  Basal Cell Skin cancer- right arm   Cataract    removed bilateral   Chronic atrial fibrillation (HCC)    Colon cancer (HCC)    Constipation due to pain medication therapy    after heart surgery   Deficiency of other specified B group vitamins    Diplopia    Disorientation, unspecified    Dysrhythmia    PAF   GERD (gastroesophageal reflux disease)    Heart murmur    Hyperlipidemia    Hypertension    Hypothyroidism    Malignant neoplasm of colon, unspecified (HCC)    Other specified diseases of blood and blood-forming organs    Other specified disorders of bone density and structure, unspecified forearm    Other thrombophilia (HCC)  per matrix   Overactive bladder    per Matrix   Personal history of other malignant neoplasm of large intestine    Presbycusis, bilateral    per matrix   RA (rheumatoid arthritis) (HCC)    Restless leg    Seizures (HCC)    after Heart Surgey   Stroke (HCC)    TIA- found by neurologist after    Valgus deformity, not elsewhere classified, right knee    per matrix   Vascular dementia, mild, without behavioral disturbance, psychotic disturbance, mood disturbance, and anxiety (HCC)    per matrix   Past Surgical History:  Procedure Laterality Date   ABDOMINAL HYSTERECTOMY  1970   Partial    COLON RESECTION  2006   cancer   COLON SURGERY     COLONOSCOPY     EYE SURGERY Bilateral    Cataract   MAXIMUM ACCESS  (MAS)POSTERIOR LUMBAR INTERBODY FUSION (PLIF) 2 LEVEL N/A 04/12/2015   Procedure: Lumbar Three-Five Decompression, Pedicle Screw Fixation, and Posteriolateral Arthrodesis;  Surgeon: Maeola Harman, MD;  Location: [facility];  Service: Neurosurgery;  Laterality: N/A;  L3-4 L4-5 Maximum access posterior lumbar fusion, possible interbodies and resection of synovial cyst at L4-5   MITRAL VALVE REPAIR  01/20/2013   Gore-tex cords to P1, P2, and P3. Magic suture to posterior medial commisure, #30 Physio 1 ring. Done in [place]   TONSILLECTOMY     about 1940   TRICUSPID VALVE SURGERY  01/20/2013   #28 TriAd ring done in [place]    Allergies  Allergen Reactions   Claritin [Loratadine] Other (See Comments)    Listed on MAR Unknown reaction   Codeine Other (See Comments)    "just don't take it well"   Gabapentin Other (See Comments)    Dizziness    Molds & Smuts Other (See Comments)    Dust.  Reaction is not listed on MAR    Pollen Extract Swelling    Grass   Bee Venom Swelling and Rash   Wasp Venom Swelling and Rash    Outpatient Encounter Medications as of 01/15/2023  Medication Sig   acetaminophen (TYLENOL) 500 MG tablet Take 1,000 mg by mouth in the morning and at bedtime.   acetaminophen (TYLENOL) 500 MG tablet Take 1,000 mg by mouth as needed.   amoxicillin (AMOXIL) 500 MG capsule Take 500 mg by mouth as needed.   apixaban (ELIQUIS) 2.5 MG TABS tablet Take 1 tablet (2.5 mg total) by mouth 2 (two) times daily.   atorvastatin (LIPITOR) 20 MG tablet TAKE 1 TABLET DAILY   Cholecalciferol (VITAMIN D) 50 MCG (2000 UT) CAPS Take 2,000 Units by mouth in the morning.   cloNIDine (CATAPRES) 0.1 MG tablet Take 1 tablet (0.1 mg total) by mouth daily as needed. If SBP >180 only   Iron, Ferrous Sulfate, 325 (65 Fe) MG TABS Take 325 mg by mouth daily.   leflunomide (ARAVA) 20 MG tablet Take 20 mg by mouth in the morning.   levalbuterol (XOPENEX) 0.63 MG/3ML nebulizer solution Take 0.63 mg by  nebulization 2 (two) times daily as needed for wheezing or shortness of breath.   levothyroxine (SYNTHROID) 100 MCG tablet Take 1 tablet (100 mcg total) by mouth daily before breakfast.   metoprolol succinate (TOPROL-XL) 100 MG 24 hr tablet Take 1 tablet (100 mg total) by mouth daily. Take with or immediately following a meal.   mirtazapine (REMERON) 15 MG tablet TAKE 1 TABLET AT BEDTIME   Multiple Vitamin (MULTIVITAMIN ADULT PO)  Take 1 tablet by mouth every morning.   Multiple Vitamins-Minerals (PRESERVISION AREDS 2) CAPS Take 1 capsule by mouth in the morning and at bedtime.   omeprazole (PRILOSEC) 40 MG capsule TAKE 1 CAPSULE DAILY   ondansetron (ZOFRAN) 4 MG tablet Take 4 mg by mouth every 6 (six) hours as needed for nausea or vomiting.   polyethylene glycol (MIRALAX / GLYCOLAX) 17 g packet Take 17 g by mouth as needed.   PSYLLIUM HUSK PO Take 2 capsules by mouth every morning.   sodium chloride 1 g tablet Take 1 g by mouth daily.   valsartan (DIOVAN) 160 MG tablet Take 160 mg by mouth daily.   Vibegron (GEMTESA) 75 MG TABS Take 75 mg by mouth daily.   vitamin B-12 (CYANOCOBALAMIN) 1000 MCG tablet Take 1,000 mcg by mouth in the morning.   furosemide (LASIX) 20 MG tablet Take 20 mg by mouth daily.   potassium chloride (KLOR-CON) 10 MEQ tablet Take 10 mEq by mouth daily.   No facility-administered encounter medications on file as of 01/15/2023.    Review of Systems  Constitutional:  Negative for activity change and appetite change.  HENT: Negative.    Respiratory:  Positive for shortness of breath. Negative for cough.   Cardiovascular:  Positive for leg swelling.  Gastrointestinal:  Negative for constipation.  Genitourinary:  Positive for frequency.  Musculoskeletal:  Positive for gait problem. Negative for arthralgias and myalgias.  Skin: Negative.   Neurological:  Negative for dizziness and weakness.  Psychiatric/Behavioral:  Positive for confusion. Negative for dysphoric mood and  sleep disturbance.     Immunization History  Administered Date(s) Administered   Fluad Quad(high Dose 65+) 01/05/2019, 12/07/2021   H1N1 04/07/2008   Influenza Whole 12/29/2008, 12/22/2009   Influenza, High Dose Seasonal PF 01/15/2012, 12/23/2014, 01/22/2020   Influenza,inj,Quad PF,6+ Mos 01/22/2014, 12/10/2016, 01/17/2018   Influenza,trivalent, recombinat, inj, PF 01/05/2016   Influenza-Unspecified 04/07/2008, 12/15/2014, 01/13/2021   Moderna Covid-19 Vaccine Bivalent Booster 19yrs & up 01/29/2022   Moderna Sars-Covid-2 Vaccination 04/07/2019, 05/05/2019, 02/09/2020   PFIZER(Purple Top)SARS-COV-2 Vaccination 01/06/2021   PPD Test 07/17/2010, 04/19/2015, 04/25/2015   Pfizer Covid-19 Vaccine Bivalent Booster 62yrs & up 07/19/2022   Pneumococcal Conjugate-13 11/10/2013   Pneumococcal Polysaccharide-23 12/27/2010, 12/05/2016   RSV,unspecified 03/28/2022   Tdap 04/15/2017   Zoster Recombinant(Shingrix) 04/09/2017, 06/18/2017   Pertinent  Health Maintenance Due  Topic Date Due   INFLUENZA VACCINE  10/25/2022   DEXA SCAN  Completed      03/22/2022   11:45 AM 05/21/2022    1:06 PM 06/06/2022    4:55 PM 07/09/2022    1:44 PM 08/29/2022    3:31 PM  Fall Risk  Falls in the past year? 1 1 1  0 1  Was there an injury with Fall? 1 1 1  0 1  Fall Risk Category Calculator 3 3 3  0 2  Fall Risk Category (Retired) High      Patient at Risk for Falls Due to History of fall(s) History of fall(s);Impaired balance/gait;Impaired mobility History of fall(s);Impaired balance/gait;Impaired mobility History of fall(s);Impaired balance/gait;Impaired mobility   Fall risk Follow up Falls evaluation completed Falls evaluation completed Falls evaluation completed;Education provided;Falls prevention discussed Falls evaluation completed Falls evaluation completed   Functional Status Survey:    Vitals:   01/15/23 0921  BP: (!) 140/80  Pulse: 66  Resp: 17  Temp: 97.8 F (36.6 C)  TempSrc: Temporal  SpO2:  94%  Weight: 131 lb 11.2 oz (59.7 kg)  Height: 5\' 2"  (1.575  m)   Body mass index is 24.09 kg/m. Physical Exam Vitals reviewed.  Constitutional:      Appearance: Normal appearance.  HENT:     Head: Normocephalic.     Nose: Nose normal.     Mouth/Throat:     Mouth: Mucous membranes are moist.     Pharynx: Oropharynx is clear.  Eyes:     Pupils: Pupils are equal, round, and reactive to light.  Cardiovascular:     Rate and Rhythm: Tachycardia present. Rhythm irregular.     Pulses: Normal pulses.     Heart sounds: Normal heart sounds. No murmur heard. Pulmonary:     Effort: Pulmonary effort is normal.     Breath sounds: Normal breath sounds.     Comments: Few crackles in her bases Abdominal:     General: Abdomen is flat. Bowel sounds are normal.     Palpations: Abdomen is soft.  Musculoskeletal:        General: Swelling present.     Cervical back: Neck supple.  Skin:    General: Skin is warm.  Neurological:     General: No focal deficit present.     Mental Status: She is alert.  Psychiatric:        Mood and Affect: Mood normal.        Thought Content: Thought content normal.     Labs reviewed: Recent Labs    01/30/22 1809 02/03/22 0000 03/10/22 0820 03/15/22 0000 06/22/22 1619 08/06/22 1327 10/18/22 0000 10/30/22 0000 11/05/22 0000 12/31/22 0000  NA 132*   < > 131*   < > 136 137   < > 137 133* 135*  K 4.3   < > 4.2   < > 4.7 4.1   < > 4.4 4.5 4.0  CL 100   < > 98   < > 99 106   < > 104 99 104  CO2 22   < > 26   < > 22 23   < > 18 25* 22  GLUCOSE 116*   < > 97  --  100* 106*  --   --   --   --   BUN 22   < > 26*   < > 18 19   < > 18 21 18   CREATININE 0.88   < > 0.75   < > 0.72 0.76   < > 0.8 0.9 0.9  CALCIUM 9.4   < > 8.9   < > 9.5 9.4   < > 8.6* 9.2 8.8  MG 1.9  --   --   --   --   --   --   --   --   --    < > = values in this interval not displayed.   Recent Labs    03/06/22 1306 03/07/22 0100 10/18/22 0000 12/31/22 0000  AST 18 22 17 26   ALT 17  16 16  36*  ALKPHOS 76 66 90 104  BILITOT 0.6 0.7  --   --   PROT 6.6 5.8*  --   --   ALBUMIN 3.8 3.1* 4.4 4.1   Recent Labs    06/05/22 1432 08/06/22 1327 10/11/22 1405 10/18/22 0000 10/30/22 0000 11/05/22 0000 12/31/22 0000  WBC 8.9 7.6 9.9 8.1 7.5 8.4 6.4  NEUTROABS 5.8  --  5.7 4.90  --   --  3.70  HGB 11.4* 11.7* 11.9* 12.2 11.2* 11.6* 10.6*  HCT 33.9* 35.2* 35.6* 36 33* 34* 31*  MCV 100.6* 100.9* 102.6*  --   --   --   --   PLT 187 170 193 188 177 169 157   Lab Results  Component Value Date   TSH 5.435 (H) 10/11/2022   Lab Results  Component Value Date   HGBA1C 5.5 01/12/2013   Lab Results  Component Value Date   CHOL 115 02/02/2021   HDL 48 02/02/2021   LDLCALC 49 02/02/2021   TRIG 91 02/02/2021    Significant Diagnostic Results in last 30 days:  No results found.  Assessment/Plan 1. SOB (shortness of breath) on exertion Chest Xray done here was negative Pro BNP was high before Trial of Lasix did help Will restart her on Lasix 20 mg every day She does have appointment with Dr Royann Shivers in 2 weeks  2. Permanent atrial fibrillation (HCC) Her HR is slightly higher then before Change Metoprolol XL 150 mg every day Check her BP and HR every day for 2 weeks She is back on her Eliquis with no issues so far  3. Urinary frequency That was when she was on Lasix Daughter is worried about the quality of life for her due to increased Frequency with lasix Will start her on Lower dose and can change it 3 /Week if needed Also wait for Cardiology Feed back 4 Hypertension BP running in 140 Will change her Metoprolol to 150 mg and Monitor BO and HR every day  All this plan was discussed with Her daughter and Dr Royann Shivers   Family/ staff Communication:   Labs/tests ordered:  BMP in 2 weeks

## 2023-01-16 DIAGNOSIS — R41841 Cognitive communication deficit: Secondary | ICD-10-CM | POA: Diagnosis not present

## 2023-01-16 DIAGNOSIS — F01A0 Vascular dementia, mild, without behavioral disturbance, psychotic disturbance, mood disturbance, and anxiety: Secondary | ICD-10-CM | POA: Diagnosis not present

## 2023-01-18 DIAGNOSIS — R41841 Cognitive communication deficit: Secondary | ICD-10-CM | POA: Diagnosis not present

## 2023-01-18 DIAGNOSIS — M6281 Muscle weakness (generalized): Secondary | ICD-10-CM | POA: Diagnosis not present

## 2023-01-18 DIAGNOSIS — F01A0 Vascular dementia, mild, without behavioral disturbance, psychotic disturbance, mood disturbance, and anxiety: Secondary | ICD-10-CM | POA: Diagnosis not present

## 2023-01-18 DIAGNOSIS — R278 Other lack of coordination: Secondary | ICD-10-CM | POA: Diagnosis not present

## 2023-01-18 DIAGNOSIS — M6389 Disorders of muscle in diseases classified elsewhere, multiple sites: Secondary | ICD-10-CM | POA: Diagnosis not present

## 2023-01-18 DIAGNOSIS — R2681 Unsteadiness on feet: Secondary | ICD-10-CM | POA: Diagnosis not present

## 2023-01-18 DIAGNOSIS — N390 Urinary tract infection, site not specified: Secondary | ICD-10-CM | POA: Diagnosis not present

## 2023-01-21 DIAGNOSIS — F01A0 Vascular dementia, mild, without behavioral disturbance, psychotic disturbance, mood disturbance, and anxiety: Secondary | ICD-10-CM | POA: Diagnosis not present

## 2023-01-21 DIAGNOSIS — R41841 Cognitive communication deficit: Secondary | ICD-10-CM | POA: Diagnosis not present

## 2023-01-23 DIAGNOSIS — M6281 Muscle weakness (generalized): Secondary | ICD-10-CM | POA: Diagnosis not present

## 2023-01-23 DIAGNOSIS — R41841 Cognitive communication deficit: Secondary | ICD-10-CM | POA: Diagnosis not present

## 2023-01-23 DIAGNOSIS — F01A0 Vascular dementia, mild, without behavioral disturbance, psychotic disturbance, mood disturbance, and anxiety: Secondary | ICD-10-CM | POA: Diagnosis not present

## 2023-01-23 DIAGNOSIS — R2681 Unsteadiness on feet: Secondary | ICD-10-CM | POA: Diagnosis not present

## 2023-01-23 DIAGNOSIS — R278 Other lack of coordination: Secondary | ICD-10-CM | POA: Diagnosis not present

## 2023-01-23 DIAGNOSIS — N390 Urinary tract infection, site not specified: Secondary | ICD-10-CM | POA: Diagnosis not present

## 2023-01-23 DIAGNOSIS — M6389 Disorders of muscle in diseases classified elsewhere, multiple sites: Secondary | ICD-10-CM | POA: Diagnosis not present

## 2023-01-25 DIAGNOSIS — F01A0 Vascular dementia, mild, without behavioral disturbance, psychotic disturbance, mood disturbance, and anxiety: Secondary | ICD-10-CM | POA: Diagnosis not present

## 2023-01-25 DIAGNOSIS — R41841 Cognitive communication deficit: Secondary | ICD-10-CM | POA: Diagnosis not present

## 2023-01-28 ENCOUNTER — Non-Acute Institutional Stay (SKILLED_NURSING_FACILITY): Payer: Medicare Other | Admitting: Internal Medicine

## 2023-01-28 ENCOUNTER — Encounter: Payer: Self-pay | Admitting: Internal Medicine

## 2023-01-28 DIAGNOSIS — I4821 Permanent atrial fibrillation: Secondary | ICD-10-CM

## 2023-01-28 DIAGNOSIS — R41841 Cognitive communication deficit: Secondary | ICD-10-CM | POA: Diagnosis not present

## 2023-01-28 DIAGNOSIS — I10 Essential (primary) hypertension: Secondary | ICD-10-CM

## 2023-01-28 DIAGNOSIS — R0602 Shortness of breath: Secondary | ICD-10-CM | POA: Diagnosis not present

## 2023-01-28 DIAGNOSIS — F01A0 Vascular dementia, mild, without behavioral disturbance, psychotic disturbance, mood disturbance, and anxiety: Secondary | ICD-10-CM | POA: Diagnosis not present

## 2023-01-28 NOTE — Progress Notes (Unsigned)
Location:  [facility] Room Number: 129A Place of Service:  SNF [phone]) Provider:  Mahlon Gammon, MD   Mahlon Gammon, MD  Patient Care Team: Mahlon Gammon, MD as PCP - General (Internal Medicine) Croitoru, Rachelle Hora, MD as PCP - Cardiology (Cardiology) Drema Dallas, DO as Consulting Physician (Neurology)  Extended Emergency Contact Information Primary Emergency Contact: [name],[name] Address: [address]          [place], [place] [address] Darden [place] of [place] Home Phone: [phone] Mobile Phone: [phone] Relation: Daughter Secondary Emergency Contact: Hollie Salk Address: [address]          [place], [place] [address] Darden [place] of [place] Mobile Phone: [phone] Relation: Son  Code Status:  DNR Goals of care: Advanced Directive information    01/28/2023   11:24 AM  Advanced Directives  Does Patient Have a Medical Advance Directive? Yes  Type of Estate agent of [place];Living will;Out of facility DNR (pink MOST or yellow form)  Does patient want to make changes to medical advance directive? No - Patient declined  Copy of Healthcare Power of Attorney in Chart? Yes - validated most recent copy scanned in chart (See row information)     Chief Complaint  Patient presents with   Acute Visit    Patient is being seen for a acute visit    Immunizations    Patient is due for covid vaccine     HPI:  Pt is a 89 y.o. female seen today for an acute visit for    Past Medical History:  Diagnosis Date   Allergy    seasonal   Anemia    Anxiety    Arthritis    Back   Asymptomatic menopausal state    Atrial fibrillation (HCC)    Cancer (HCC) 2006   Colon.  Basal Cell Skin cancer- right arm   Cataract    removed bilateral   Chronic atrial fibrillation (HCC)    Colon cancer (HCC)    Constipation due to pain medication therapy    after heart surgery   Deficiency of other specified B group vitamins     Diplopia    Disorientation, unspecified    Dysrhythmia    PAF   GERD (gastroesophageal reflux disease)    Heart murmur    Hyperlipidemia    Hypertension    Hypothyroidism    Malignant neoplasm of colon, unspecified (HCC)    Other specified diseases of blood and blood-forming organs    Other specified disorders of bone density and structure, unspecified forearm    Other thrombophilia (HCC)    per matrix   Overactive bladder    per Matrix   Personal history of other malignant neoplasm of large intestine    Presbycusis, bilateral    per matrix   RA (rheumatoid arthritis) (HCC)    Restless leg    Seizures (HCC)    after Heart Surgey   Stroke (HCC)    TIA- found by neurologist after    Valgus deformity, not elsewhere classified, right knee    per matrix   Vascular dementia, mild, without behavioral disturbance, psychotic disturbance, mood disturbance, and anxiety (HCC)    per matrix   Past Surgical History:  Procedure Laterality Date   ABDOMINAL HYSTERECTOMY  1970   Partial    COLON RESECTION  2006   cancer   COLON SURGERY     COLONOSCOPY     EYE SURGERY Bilateral    Cataract  MAXIMUM ACCESS (MAS)POSTERIOR LUMBAR INTERBODY FUSION (PLIF) 2 LEVEL N/A 04/12/2015   Procedure: Lumbar Three-Five Decompression, Pedicle Screw Fixation, and Posteriolateral Arthrodesis;  Surgeon: Maeola Harman, MD;  Location: [facility];  Service: Neurosurgery;  Laterality: N/A;  L3-4 L4-5 Maximum access posterior lumbar fusion, possible interbodies and resection of synovial cyst at L4-5   MITRAL VALVE REPAIR  01/20/2013   Gore-tex cords to P1, P2, and P3. Magic suture to posterior medial commisure, #30 Physio 1 ring. Done in [place]   TONSILLECTOMY     about 1940   TRICUSPID VALVE SURGERY  01/20/2013   #28 TriAd ring done in [place]    Allergies  Allergen Reactions   Claritin [Loratadine] Other (See Comments)    Listed on MAR Unknown reaction   Codeine Other (See Comments)    "just  don't take it well"   Gabapentin Other (See Comments)    Dizziness    Molds & Smuts Other (See Comments)    Dust.  Reaction is not listed on MAR    Pollen Extract Swelling    Grass   Bee Venom Swelling and Rash   Wasp Venom Swelling and Rash    Outpatient Encounter Medications as of 01/28/2023  Medication Sig   acetaminophen (TYLENOL) 500 MG tablet Take 1,000 mg by mouth in the morning and at bedtime.   acetaminophen (TYLENOL) 500 MG tablet Take 1,000 mg by mouth as needed.   amoxicillin (AMOXIL) 500 MG capsule Take 500 mg by mouth as needed.   apixaban (ELIQUIS) 2.5 MG TABS tablet Take 1 tablet (2.5 mg total) by mouth 2 (two) times daily.   atorvastatin (LIPITOR) 20 MG tablet TAKE 1 TABLET DAILY   Cholecalciferol (VITAMIN D) 50 MCG (2000 UT) CAPS Take 2,000 Units by mouth in the morning.   cloNIDine (CATAPRES) 0.1 MG tablet Take 1 tablet (0.1 mg total) by mouth daily as needed. If SBP >180 only   Iron, Ferrous Sulfate, 325 (65 Fe) MG TABS Take 325 mg by mouth daily.   leflunomide (ARAVA) 20 MG tablet Take 20 mg by mouth in the morning.   levalbuterol (XOPENEX) 0.63 MG/3ML nebulizer solution Take 0.63 mg by nebulization 2 (two) times daily as needed for wheezing or shortness of breath.   levothyroxine (SYNTHROID) 100 MCG tablet Take 1 tablet (100 mcg total) by mouth daily before breakfast.   metoprolol succinate (TOPROL-XL) 100 MG 24 hr tablet Take 1 tablet (100 mg total) by mouth daily. Take with or immediately following a meal. (Patient taking differently: Take 100 mg by mouth daily. Take 150mg  with or immediately following a meal.)   mirtazapine (REMERON) 15 MG tablet TAKE 1 TABLET AT BEDTIME   Multiple Vitamin (MULTIVITAMIN ADULT PO) Take 1 tablet by mouth every morning.   Multiple Vitamins-Minerals (PRESERVISION AREDS 2) CAPS Take 1 capsule by mouth in the morning and at bedtime.   omeprazole (PRILOSEC) 40 MG capsule TAKE 1 CAPSULE DAILY   ondansetron (ZOFRAN) 4 MG tablet Take 4 mg  by mouth every 6 (six) hours as needed for nausea or vomiting.   polyethylene glycol (MIRALAX / GLYCOLAX) 17 g packet Take 17 g by mouth as needed.   PSYLLIUM HUSK PO Take 2 capsules by mouth every morning.   sodium chloride 1 g tablet Take 1 g by mouth daily.   valsartan (DIOVAN) 160 MG tablet Take 160 mg by mouth daily.   Vibegron (GEMTESA) 75 MG TABS Take 75 mg by mouth daily.   vitamin B-12 (CYANOCOBALAMIN) 1000 MCG  tablet Take 1,000 mcg by mouth in the morning.   furosemide (LASIX) 20 MG tablet Take 20 mg by mouth daily.   potassium chloride (KLOR-CON) 10 MEQ tablet Take 10 mEq by mouth daily.   No facility-administered encounter medications on file as of 01/28/2023.    Review of Systems  Immunization History  Administered Date(s) Administered   Fluad Quad(high Dose 65+) 01/05/2019, 12/07/2021, 01/15/2023   H1N1 04/07/2008   Influenza Whole 12/29/2008, 12/22/2009   Influenza, High Dose Seasonal PF 01/15/2012, 12/23/2014, 01/22/2020   Influenza,inj,Quad PF,6+ Mos 01/22/2014, 12/10/2016, 01/17/2018   Influenza,trivalent, recombinat, inj, PF 01/05/2016   Influenza-Unspecified 04/07/2008, 12/15/2014, 01/13/2021   Moderna Covid-19 Vaccine Bivalent Booster 43yrs & up 01/29/2022   Moderna Sars-Covid-2 Vaccination 04/07/2019, 05/05/2019, 02/09/2020   PFIZER(Purple Top)SARS-COV-2 Vaccination 01/06/2021   PPD Test 07/17/2010, 04/19/2015, 04/25/2015   Pfizer Covid-19 Vaccine Bivalent Booster 15yrs & up 07/19/2022   Pneumococcal Conjugate-13 11/10/2013   Pneumococcal Polysaccharide-23 12/27/2010, 12/05/2016   RSV,unspecified 03/28/2022   Tdap 04/15/2017   Zoster Recombinant(Shingrix) 04/09/2017, 06/18/2017   Pertinent  Health Maintenance Due  Topic Date Due   INFLUENZA VACCINE  Completed   DEXA SCAN  Completed      03/22/2022   11:45 AM 05/21/2022    1:06 PM 06/06/2022    4:55 PM 07/09/2022    1:44 PM 08/29/2022    3:31 PM  Fall Risk  Falls in the past year? 1 1 1  0 1  Was there  an injury with Fall? 1 1 1  0 1  Fall Risk Category Calculator 3 3 3  0 2  Fall Risk Category (Retired) High      Patient at Risk for Falls Due to History of fall(s) History of fall(s);Impaired balance/gait;Impaired mobility History of fall(s);Impaired balance/gait;Impaired mobility History of fall(s);Impaired balance/gait;Impaired mobility   Fall risk Follow up Falls evaluation completed Falls evaluation completed Falls evaluation completed;Education provided;Falls prevention discussed Falls evaluation completed Falls evaluation completed   Functional Status Survey:    Vitals:   01/28/23 1118  BP: 122/72  Pulse: 80  Resp: 18  Temp: 98.2 F (36.8 C)  TempSrc: Temporal  SpO2: 96%  Weight: 127 lb 9.6 oz (57.9 kg)  Height: 5\' 2"  (1.575 m)   Body mass index is 23.34 kg/m. Physical Exam  Labs reviewed: Recent Labs    01/30/22 1809 02/03/22 0000 03/10/22 0820 03/15/22 0000 06/22/22 1619 08/06/22 1327 10/18/22 0000 11/05/22 0000 12/31/22 0000 01/07/23 0000  NA 132*   < > 131*   < > 136 137   < > 133* 135* 137  K 4.3   < > 4.2   < > 4.7 4.1   < > 4.5 4.0 4.0  CL 100   < > 98   < > 99 106   < > 99 104 102  CO2 22   < > 26   < > 22 23   < > 25* 22 23*  GLUCOSE 116*   < > 97  --  100* 106*  --   --   --   --   BUN 22   < > 26*   < > 18 19   < > 21 18 23*  CREATININE 0.88   < > 0.75   < > 0.72 0.76   < > 0.9 0.9 0.9  CALCIUM 9.4   < > 8.9   < > 9.5 9.4   < > 9.2 8.8 8.9  MG 1.9  --   --   --   --   --   --   --   --   --    < > =  values in this interval not displayed.   Recent Labs    03/06/22 1306 03/07/22 0100 10/18/22 0000 12/31/22 0000  AST 18 22 17 26   ALT 17 16 16  36*  ALKPHOS 76 66 90 104  BILITOT 0.6 0.7  --   --   PROT 6.6 5.8*  --   --   ALBUMIN 3.8 3.1* 4.4 4.1   Recent Labs    06/05/22 1432 08/06/22 1327 10/11/22 1405 10/18/22 0000 10/30/22 0000 11/05/22 0000 12/31/22 0000  WBC 8.9 7.6 9.9 8.1 7.5 8.4 6.4  NEUTROABS 5.8  --  5.7 4.90  --   --   3.70  HGB 11.4* 11.7* 11.9* 12.2 11.2* 11.6* 10.6*  HCT 33.9* 35.2* 35.6* 36 33* 34* 31*  MCV 100.6* 100.9* 102.6*  --   --   --   --   PLT 187 170 193 188 177 169 157   Lab Results  Component Value Date   TSH 0.28 (A) 01/07/2023   Lab Results  Component Value Date   HGBA1C 5.5 01/12/2013   Lab Results  Component Value Date   CHOL 115 02/02/2021   HDL 48 02/02/2021   LDLCALC 49 02/02/2021   TRIG 91 02/02/2021    Significant Diagnostic Results in last 30 days:  No results found.  Assessment/Plan 1. SOB (shortness of breath) on exertion ***  2. Permanent atrial fibrillation (HCC) ***  3. Essential hypertension ***    Family/ staff Communication: ***  Labs/tests ordered:  ***

## 2023-01-28 NOTE — Progress Notes (Unsigned)
Location: [facility] Room Number: 129A Place of Service:  SNF [phone])  Provider:   Code Status: DNR Goals of Care:     01/28/2023   11:24 AM  Advanced Directives  Does Patient Have a Medical Advance Directive? Yes  Type of Estate agent of [place];Living will;Out of facility DNR (pink MOST or yellow form)  Does patient want to make changes to medical advance directive? No - Patient declined  Copy of Healthcare Power of Attorney in Chart? Yes - validated most recent copy scanned in chart (See row information)     Chief Complaint  Patient presents with   Acute Visit    Patient is being seen for a acute visit    Immunizations    Patient is due for covid vaccine     HPI: Patient is a 89 y.o. female seen today for an acute visit for SOB on Exertion Lives in [place] F in    Patient continues to have issue with  SOB especially with Exertion C/o Some cough with Mucus No Chest pain or Fever  Some wheezing per Nurses  Per Last visit her Chest Xray was negative for any vascular Congestion But her Pro BNP was 957  I had started on Low dose of Lasix and she has lost some weight   Wt Readings from Last 3 Encounters:  01/30/23 128 lb (58.1 kg)  01/28/23 127 lb 9.6 oz (57.9 kg)  01/15/23 131 lb 11.2 oz (59.7 kg)    Other history Has A Fib on Eliquis now Recently restarted Hypertension and HLD H/o TIA  Cerebral Amyloid angiopathy on MRI Colon Cancer treated with Immunotherapy No Surgery in Remission  Anemia  Cognitive impairment MMSE 15/30 Rheumatoid arthritis on Arava  Past Medical History:  Diagnosis Date   Allergy    seasonal   Anemia    Anxiety    Arthritis    Back   Asymptomatic menopausal state    Atrial fibrillation (HCC)    Cancer (HCC) 2006   Colon.  Basal Cell Skin cancer- right arm   Cataract    removed bilateral   Chronic atrial fibrillation (HCC)    Colon cancer (HCC)    Constipation due to pain  medication therapy    after heart surgery   Deficiency of other specified B group vitamins    Diplopia    Disorientation, unspecified    Dysrhythmia    PAF   GERD (gastroesophageal reflux disease)    Heart murmur    Hyperlipidemia    Hypertension    Hypothyroidism    Malignant neoplasm of colon, unspecified (HCC)    Other specified diseases of blood and blood-forming organs    Other specified disorders of bone density and structure, unspecified forearm    Other thrombophilia (HCC)    per matrix   Overactive bladder    per Matrix   Personal history of other malignant neoplasm of large intestine    Presbycusis, bilateral    per matrix   RA (rheumatoid arthritis) (HCC)    Restless leg    Seizures (HCC)    after Heart Surgey   Stroke (HCC)    TIA- found by neurologist after    Valgus deformity, not elsewhere classified, right knee    per matrix   Vascular dementia, mild, without behavioral disturbance, psychotic disturbance, mood disturbance, and anxiety (HCC)    per matrix    Past Surgical History:  Procedure Laterality Date   ABDOMINAL HYSTERECTOMY  1970   Partial    COLON RESECTION  2006   cancer   COLON SURGERY     COLONOSCOPY     EYE SURGERY Bilateral    Cataract   MAXIMUM ACCESS (MAS)POSTERIOR LUMBAR INTERBODY FUSION (PLIF) 2 LEVEL N/A 04/12/2015   Procedure: Lumbar Three-Five Decompression, Pedicle Screw Fixation, and Posteriolateral Arthrodesis;  Surgeon: Maeola Harman, MD;  Location: [facility];  Service: Neurosurgery;  Laterality: N/A;  L3-4 L4-5 Maximum access posterior lumbar fusion, possible interbodies and resection of synovial cyst at L4-5   MITRAL VALVE REPAIR  01/20/2013   Gore-tex cords to P1, P2, and P3. Magic suture to posterior medial commisure, #30 Physio 1 ring. Done in [place]   TONSILLECTOMY     about 1940   TRICUSPID VALVE SURGERY  01/20/2013   #28 TriAd ring done in [place]    Allergies  Allergen Reactions   Claritin [Loratadine] Other  (See Comments)    Listed on MAR Unknown reaction   Codeine Other (See Comments)    "just don't take it well"   Gabapentin Other (See Comments)    Dizziness    Molds & Smuts Other (See Comments)    Dust.  Reaction is not listed on MAR    Pollen Extract Swelling    Grass   Bee Venom Swelling and Rash   Wasp Venom Swelling and Rash    Outpatient Encounter Medications as of 01/28/2023  Medication Sig   acetaminophen (TYLENOL) 500 MG tablet Take 1,000 mg by mouth in the morning and at bedtime.   acetaminophen (TYLENOL) 500 MG tablet Take 1,000 mg by mouth as needed. (Patient not taking: Reported on 01/30/2023)   amoxicillin (AMOXIL) 500 MG capsule Take 500 mg by mouth as needed.   apixaban (ELIQUIS) 2.5 MG TABS tablet Take 1 tablet (2.5 mg total) by mouth 2 (two) times daily.   atorvastatin (LIPITOR) 20 MG tablet TAKE 1 TABLET DAILY   Cholecalciferol (VITAMIN D) 50 MCG (2000 UT) CAPS Take 2,000 Units by mouth in the morning.   cloNIDine (CATAPRES) 0.1 MG tablet Take 1 tablet (0.1 mg total) by mouth daily as needed. If SBP >180 only   Iron, Ferrous Sulfate, 325 (65 Fe) MG TABS Take 325 mg by mouth daily.   leflunomide (ARAVA) 20 MG tablet Take 20 mg by mouth in the morning.   levalbuterol (XOPENEX) 0.63 MG/3ML nebulizer solution Take 0.63 mg by nebulization 2 (two) times daily as needed for wheezing or shortness of breath.   levothyroxine (SYNTHROID) 100 MCG tablet Take 1 tablet (100 mcg total) by mouth daily before breakfast.   metoprolol succinate (TOPROL-XL) 100 MG 24 hr tablet Take 1 tablet (100 mg total) by mouth daily. Take with or immediately following a meal. (Patient taking differently: Take 100 mg by mouth daily. Take 150mg  with or immediately following a meal.)   mirtazapine (REMERON) 15 MG tablet TAKE 1 TABLET AT BEDTIME   Multiple Vitamin (MULTIVITAMIN ADULT PO) Take 1 tablet by mouth every morning.   Multiple Vitamins-Minerals (PRESERVISION AREDS 2) CAPS Take 1 capsule by mouth  in the morning and at bedtime.   omeprazole (PRILOSEC) 40 MG capsule TAKE 1 CAPSULE DAILY   ondansetron (ZOFRAN) 4 MG tablet Take 4 mg by mouth every 6 (six) hours as needed for nausea or vomiting. (Patient not taking: Reported on 01/30/2023)   polyethylene glycol (MIRALAX / GLYCOLAX) 17 g packet Take 17 g by mouth as needed.   PSYLLIUM HUSK PO Take 2 capsules by mouth  every morning.   valsartan (DIOVAN) 160 MG tablet Take 160 mg by mouth daily.   Vibegron (GEMTESA) 75 MG TABS Take 75 mg by mouth daily.   vitamin B-12 (CYANOCOBALAMIN) 1000 MCG tablet Take 1,000 mcg by mouth in the morning.   [DISCONTINUED] sodium chloride 1 g tablet Take 1 g by mouth daily.   potassium chloride (KLOR-CON) 10 MEQ tablet Take 10 mEq by mouth daily.   [DISCONTINUED] furosemide (LASIX) 20 MG tablet Take 20 mg by mouth daily.   No facility-administered encounter medications on file as of 01/28/2023.    Review of Systems:  Review of Systems  Constitutional:  Negative for activity change and appetite change.  HENT: Negative.    Respiratory:  Positive for shortness of breath. Negative for cough.   Cardiovascular:  Negative for leg swelling.  Gastrointestinal:  Negative for constipation.  Genitourinary: Negative.   Musculoskeletal:  Positive for gait problem. Negative for arthralgias and myalgias.  Skin: Negative.   Neurological:  Negative for dizziness and weakness.  Psychiatric/Behavioral:  Positive for confusion. Negative for dysphoric mood and sleep disturbance.     Health Maintenance  Topic Date Due   COVID-19 Vaccine (7 - 2023-24 season) 11/25/2022   DTaP/Tdap/Td (2 - Td or Tdap) 04/16/2027   Pneumonia Vaccine 75+ Years old  Completed   INFLUENZA VACCINE  Completed   DEXA SCAN  Completed   Zoster Vaccines- Shingrix  Completed   HPV VACCINES  Aged Out    Physical Exam: Vitals:   01/28/23 1118  BP: 122/72  Pulse: 80  Resp: 18  Temp: 98.2 F (36.8 C)  TempSrc: Temporal  SpO2: 96%  Weight:  127 lb 9.6 oz (57.9 kg)  Height: 5\' 2"  (1.575 m)   Body mass index is 23.34 kg/m. Physical Exam Vitals reviewed.  Constitutional:      Appearance: Normal appearance.  HENT:     Head: Normocephalic.     Nose: Nose normal.     Mouth/Throat:     Mouth: Mucous membranes are moist.     Pharynx: Oropharynx is clear.  Eyes:     Pupils: Pupils are equal, round, and reactive to light.  Cardiovascular:     Rate and Rhythm: Normal rate. Rhythm irregular.     Pulses: Normal pulses.     Heart sounds: Murmur heard.  Pulmonary:     Effort: Pulmonary effort is normal.     Breath sounds: No wheezing or rales.  Abdominal:     General: Abdomen is flat. Bowel sounds are normal.     Palpations: Abdomen is soft.  Musculoskeletal:        General: No swelling.     Cervical back: Neck supple.  Skin:    General: Skin is warm.  Neurological:     General: No focal deficit present.     Mental Status: She is alert.  Psychiatric:        Mood and Affect: Mood normal.        Thought Content: Thought content normal.     Labs reviewed: Basic Metabolic Panel: Recent Labs    03/10/22 0820 03/15/22 0000 04/17/22 1128 06/22/22 1619 08/06/22 1327 10/11/22 1405 10/18/22 0000 11/05/22 0000 12/31/22 0000 01/07/23 0000  NA 131*   < >  --  136 137  --    < > 133* 135* 137  K 4.2   < >  --  4.7 4.1  --    < > 4.5 4.0 4.0  CL 98   < >  --  99 106  --    < > 99 104 102  CO2 26   < >  --  22 23  --    < > 25* 22 23*  GLUCOSE 97  --   --  100* 106*  --   --   --   --   --   BUN 26*   < >  --  18 19  --    < > 21 18 23*  CREATININE 0.75   < >  --  0.72 0.76  --    < > 0.9 0.9 0.9  CALCIUM 8.9   < >  --  9.5 9.4  --    < > 9.2 8.8 8.9  TSH  --    < > 3.519  --   --  5.435*  --   --   --  0.28*   < > = values in this interval not displayed.   Liver Function Tests: Recent Labs    03/06/22 1306 03/07/22 0100 10/18/22 0000 12/31/22 0000  AST 18 22 17 26   ALT 17 16 16  36*  ALKPHOS 76 66 90 104   BILITOT 0.6 0.7  --   --   PROT 6.6 5.8*  --   --   ALBUMIN 3.8 3.1* 4.4 4.1   No results for input(s): "LIPASE", "AMYLASE" in the last 8760 hours. No results for input(s): "AMMONIA" in the last 8760 hours. CBC: Recent Labs    06/05/22 1432 08/06/22 1327 10/11/22 1405 10/18/22 0000 10/30/22 0000 11/05/22 0000 12/31/22 0000  WBC 8.9 7.6 9.9 8.1 7.5 8.4 6.4  NEUTROABS 5.8  --  5.7 4.90  --   --  3.70  HGB 11.4* 11.7* 11.9* 12.2 11.2* 11.6* 10.6*  HCT 33.9* 35.2* 35.6* 36 33* 34* 31*  MCV 100.6* 100.9* 102.6*  --   --   --   --   PLT 187 170 193 188 177 169 157   Lipid Panel: No results for input(s): "CHOL", "HDL", "LDLCALC", "TRIG", "CHOLHDL", "LDLDIRECT" in the last 8760 hours. Lab Results  Component Value Date   HGBA1C 5.5 01/12/2013    Procedures since last visit: No results found.  Assessment/Plan 1. SOB (shortness of breath) on exertion Better Wheezing noticed per Nurses Her lungs were clear today on exam HR is better with Higher dose of Metoprolol BP better Plan for Echo Per cardiology Patient does have h/o Chronic Lung disease/Bronchitis Will Try Xopenex BID for 3 days And them Prn  Continue on Low dose of lasix 2. Permanent atrial fibrillation (HCC) Doing better with Metoprolol and is back on Eliquis  3. Essential hypertension Better controlled with Metoprolol    Labs/tests ordered:  * No order type specified * Next appt:  Visit date not found

## 2023-01-29 ENCOUNTER — Encounter: Payer: Self-pay | Admitting: Cardiovascular Disease

## 2023-01-29 DIAGNOSIS — E785 Hyperlipidemia, unspecified: Secondary | ICD-10-CM | POA: Diagnosis not present

## 2023-01-30 ENCOUNTER — Ambulatory Visit: Payer: Medicare Other | Attending: Cardiovascular Disease | Admitting: Cardiovascular Disease

## 2023-01-30 VITALS — BP 98/60 | HR 71 | Ht 62.0 in | Wt 128.0 lb

## 2023-01-30 DIAGNOSIS — I10 Essential (primary) hypertension: Secondary | ICD-10-CM | POA: Diagnosis not present

## 2023-01-30 DIAGNOSIS — E871 Hypo-osmolality and hyponatremia: Secondary | ICD-10-CM

## 2023-01-30 DIAGNOSIS — D6869 Other thrombophilia: Secondary | ICD-10-CM | POA: Diagnosis not present

## 2023-01-30 DIAGNOSIS — I5032 Chronic diastolic (congestive) heart failure: Secondary | ICD-10-CM

## 2023-01-30 DIAGNOSIS — Z9889 Other specified postprocedural states: Secondary | ICD-10-CM

## 2023-01-30 DIAGNOSIS — F01A0 Vascular dementia, mild, without behavioral disturbance, psychotic disturbance, mood disturbance, and anxiety: Secondary | ICD-10-CM | POA: Diagnosis not present

## 2023-01-30 DIAGNOSIS — I4821 Permanent atrial fibrillation: Secondary | ICD-10-CM

## 2023-01-30 DIAGNOSIS — R41841 Cognitive communication deficit: Secondary | ICD-10-CM | POA: Diagnosis not present

## 2023-01-30 MED ORDER — FUROSEMIDE 20 MG PO TABS: ORAL_TABLET | ORAL | 3 refills | Status: DC

## 2023-01-30 NOTE — Progress Notes (Unsigned)
Speech Cardiology Office Note:  .   Date:  01/31/2023  ID:  Kathy Velez, DOB 1931/10/10, MRN [id number] PCP: Mahlon Gammon, MD  [facility] Providers Cardiologist:  Thurmon Fair, MD    History of Present Illness: .   Kathy Velez is a 89 y.o. female with paroxysmal atrial fibrillation, history of stroke, vascular dementia, carotid artery disease, aortic atherosclerosis, hypertension, history of mitral and tricuspid valve repair (MV #30 Physio Ring; TV # 28 TriAd Ring 2014, [place]), chronic interstitial lung disease, history of colon cancer in remission, GERD, treated hypothyroidism, hyponatremia (probable SIADH).  She is accompanied by her daughter Patric Dykes.  She had several falls in 2023, but has done well this year without any new falls in about 12 months.  We have restarted her on Eliquis.  She has not had any serious bleeding problems since then.  She is now in a more closely supervised location in the skilled nursing facility, due to her worsening memory issues.  At her last appointment with the stopped digoxin and increased her dose of metoprolol.  At least today ventricular rate control appears to be good with a rate of 70 bpm.  Her daughter says that she initially did well after this change, but she has developed shortness of breath over the last couple of months, that improved after she was prescribed furosemide.  Dr. Chales Abrahams checked a chest x-ray which did not show evidence of heart failure, but the BNP was elevated at 957 (previously 238 in August 2024).Marland Kitchen  When the dose of furosemide was decreased the problems of shortness of breath recurred.  Furosemide does cause a deterioration in quality of life due to the need for frequent urination.  Her weight has been slightly higher than her previous usual weight.  She has mild ankle edema.  She does not have orthopnea or PND.  She is not aware of palpitations and she has not had any dizziness or syncope.  She denies chest pain.  She had  some problems with hyponatremia in the past and for this reason she is currently receiving sodium chloride tablets, 1000 mg daily.  She has not had any recent new focal neurological complaints, but her memory has been deteriorating slowly, the diagnosis being vascular dementia.  She does have a history of several TIAs in the past and one major stroke presenting with a fall and aphasia in January 2021.  CT angiography at that time showed no significant large vessel stenoses in the cerebral circulation but there was mild atherosclerotic calcification scattered throughout.  ROS: Worsening short-term memory, but still knows her daughter and grandchildren and is able to hold a coherent conversation.  Does not appear short of breath at rest.  Studies Reviewed: .        Echocardiogram 2020   1. The left ventricle has normal systolic function with an ejection  fraction of 60-65%. The cavity size was normal. There is mildly increased  left ventricular wall thickness. Left ventricular diastolic function could  not be evaluated secondary to atrial   fibrillation. No evidence of left ventricular regional wall motion  abnormalities.   2. Left atrial size was severely dilated.   3. A 30 mm PhysioRing valve is present in the mitral position.   4. Status post tricuspid valve repair. 28 mm TriAd annuloplasty ring is  present in the tricuspid position.   5. The tricuspid valve is abnormal.   6. The aortic valve is tricuspid. Mild sclerosis of the aortic valve. No  stenosis of the aortic valve.   7. The aorta is normal unless otherwise noted.   8. The inferior vena cava was normal in size with <50% respiratory  variability.   Risk Assessment/Calculations:    CHA2DS2-VASc Score = 8   This indicates a 10.8% annual risk of stroke. The patient's score is based upon: CHF History: 1 HTN History: 1 Diabetes History: 0 Stroke History: 2 Vascular Disease History: 1 Age Score: 2 Gender Score: 1             Physical Exam:   VS:  BP 98/60 (BP Location: Left Arm, Patient Position: Sitting, Cuff Size: Normal)   Pulse 71   Ht 5\' 2"  (1.575 m)   Wt 128 lb (58.1 kg)   SpO2 90%   BMI 23.41 kg/m    Wt Readings from Last 3 Encounters:  01/30/23 128 lb (58.1 kg)  01/28/23 127 lb 9.6 oz (57.9 kg)  01/15/23 131 lb 11.2 oz (59.7 kg)    GEN: Well nourished, well developed in no acute distress NECK: 6-7 cm JVD; No carotid bruits CARDIAC: irregular, no murmurs, rubs, gallops RESPIRATORY:  Clear to auscultation without rales, wheezing or rhonchi  ABDOMEN: Soft, non-tender, non-distended EXTREMITIES: 1+ pitting pedal, ankle and pretibial edema bilaterally; No deformity   ASSESSMENT AND PLAN: .    1. Permanent atrial fibrillation (HCC)   2. Acquired thrombophilia (HCC)   3. Chronic diastolic heart failure (HCC)   4. Hyponatremia   5. Status post mitral valve repair   6. Status post tricuspid valve repair   7. Essential hypertension       AFib: Permanent arrhythmia treated with rate control, which seems to be adequately achieved just with beta-blockers.  Severe left atrial dilation.  Digoxin was stopped in September by me, due to concerns for toxicity at her age, and it is possible that her worsening heart failure could be associated with this, although this is most commonly seen with systolic heart failure.   Stroke risk: Severely elevated risk with a CHA2DS2-VASc score of 8 and previous history of stroke.  Eliquis was restarted a couple of months ago since she has not had any recent falls.  So far, no bleeding problems or falls. CHF: She still has some signs of hypervolemia today, although her weight is similar to what it was when we last met in clinic.  Continues to have NYHA functional class 2-3 dyspnea.  BNP increased from a baseline around 200 to greater than 900.   Will recheck her echocardiogram.  Would like to stop her sodium chloride tablets, which would allow [place] to prescribe less diuretic.   Recommended daily weights and prescribed a weight-based furosemide prescription (take 20 mg on days when weight is greater than 124 pounds). Hyponatremia: Her recent sodium level was borderline at 135.  The lowest I see that her sodium got was 127 in December 2023.  Mostly it has been in the 133-137 range.  S/P MV and TV repair: Check echo in view of the change in her symptoms HTN: Borderline low, asymptomatic.       Dispo: Check echocardiogram; furosemide 20 mg daily if weight is greater than 124 pounds.  Stop the sodium chloride tablets.  Recheck labs in a few weeks.  Has follow-up appointment scheduled for generally 22nd.  Signed, Thurmon Fair, MD

## 2023-01-30 NOTE — Patient Instructions (Addendum)
Medication Instructions:  STOP SODIUM CHLORIDE TABLETS  TAKE  FUROSEMIDE 20 mg daily until weight is 124 pounds. Then take 20 mg daily ONLY AS NEEDED for weight of 125 pounds or greater.  *If you need a refill on your cardiac medications before your next  appointment, please call your pharmacy*  Follow-Up: At [facility], you and your health needs are our priority.  As part of our continuing mission to provide you with exceptional heart care, we have created designated Provider Care Teams.  These Care Teams include your primary Cardiologist (physician) and Advanced Practice Providers (APPs -  Physician Assistants and Nurse Practitioners) who all work together to provide you with the care you need, when you need it.  We recommend signing up for the patient portal called "MyChart".  Sign up information is provided on this After Visit Summary.  MyChart is used to connect with patients for Virtual Visits (Telemedicine).  Patients are able to view lab/test results, encounter notes, upcoming appointments, etc.  Non-urgent messages can be sent to your provider as well.   To learn more about what you can do with MyChart, go to [email or web address].    Your next appointment:    Appointment scheduled for Wednesday 04/17/2023 at 10:00  Provider:   Thurmon Fair, MD

## 2023-02-01 DIAGNOSIS — R41841 Cognitive communication deficit: Secondary | ICD-10-CM | POA: Diagnosis not present

## 2023-02-01 DIAGNOSIS — F01A0 Vascular dementia, mild, without behavioral disturbance, psychotic disturbance, mood disturbance, and anxiety: Secondary | ICD-10-CM | POA: Diagnosis not present

## 2023-02-04 DIAGNOSIS — R41841 Cognitive communication deficit: Secondary | ICD-10-CM | POA: Diagnosis not present

## 2023-02-04 DIAGNOSIS — F01A0 Vascular dementia, mild, without behavioral disturbance, psychotic disturbance, mood disturbance, and anxiety: Secondary | ICD-10-CM | POA: Diagnosis not present

## 2023-02-05 ENCOUNTER — Inpatient Hospital Stay: Payer: Medicare Other | Attending: Oncology

## 2023-02-05 ENCOUNTER — Inpatient Hospital Stay (HOSPITAL_BASED_OUTPATIENT_CLINIC_OR_DEPARTMENT_OTHER): Payer: Medicare Other | Admitting: Oncology

## 2023-02-05 ENCOUNTER — Telehealth: Payer: Self-pay

## 2023-02-05 VITALS — BP 142/87 | HR 100 | Temp 98.1°F | Resp 18 | Ht 62.0 in | Wt 126.9 lb

## 2023-02-05 DIAGNOSIS — Z7901 Long term (current) use of anticoagulants: Secondary | ICD-10-CM | POA: Insufficient documentation

## 2023-02-05 DIAGNOSIS — E039 Hypothyroidism, unspecified: Secondary | ICD-10-CM | POA: Insufficient documentation

## 2023-02-05 DIAGNOSIS — D539 Nutritional anemia, unspecified: Secondary | ICD-10-CM | POA: Diagnosis not present

## 2023-02-05 DIAGNOSIS — Z8673 Personal history of transient ischemic attack (TIA), and cerebral infarction without residual deficits: Secondary | ICD-10-CM | POA: Insufficient documentation

## 2023-02-05 DIAGNOSIS — Z85038 Personal history of other malignant neoplasm of large intestine: Secondary | ICD-10-CM | POA: Insufficient documentation

## 2023-02-05 DIAGNOSIS — Z08 Encounter for follow-up examination after completed treatment for malignant neoplasm: Secondary | ICD-10-CM | POA: Insufficient documentation

## 2023-02-05 DIAGNOSIS — C185 Malignant neoplasm of splenic flexure: Secondary | ICD-10-CM

## 2023-02-05 DIAGNOSIS — I4891 Unspecified atrial fibrillation: Secondary | ICD-10-CM | POA: Insufficient documentation

## 2023-02-05 DIAGNOSIS — R5383 Other fatigue: Secondary | ICD-10-CM

## 2023-02-05 DIAGNOSIS — M069 Rheumatoid arthritis, unspecified: Secondary | ICD-10-CM | POA: Diagnosis not present

## 2023-02-05 LAB — CBC WITH DIFFERENTIAL (CANCER CENTER ONLY)
Abs Immature Granulocytes: 0.02 10*3/uL (ref 0.00–0.07)
Basophils Absolute: 0.1 10*3/uL (ref 0.0–0.1)
Basophils Relative: 1 %
Eosinophils Absolute: 0.1 10*3/uL (ref 0.0–0.5)
Eosinophils Relative: 1 %
HCT: 34.6 % — ABNORMAL LOW (ref 36.0–46.0)
Hemoglobin: 11.5 g/dL — ABNORMAL LOW (ref 12.0–15.0)
Immature Granulocytes: 0 %
Lymphocytes Relative: 26 %
Lymphs Abs: 1.9 10*3/uL (ref 0.7–4.0)
MCH: 34 pg (ref 26.0–34.0)
MCHC: 33.2 g/dL (ref 30.0–36.0)
MCV: 102.4 fL — ABNORMAL HIGH (ref 80.0–100.0)
Monocytes Absolute: 1 10*3/uL (ref 0.1–1.0)
Monocytes Relative: 14 %
Neutro Abs: 4.5 10*3/uL (ref 1.7–7.7)
Neutrophils Relative %: 58 %
Platelet Count: 163 10*3/uL (ref 150–400)
RBC: 3.38 MIL/uL — ABNORMAL LOW (ref 3.87–5.11)
RDW: 14.6 % (ref 11.5–15.5)
WBC Count: 7.6 10*3/uL (ref 4.0–10.5)
nRBC: 0 % (ref 0.0–0.2)

## 2023-02-05 LAB — CMP (CANCER CENTER ONLY)
ALT: 19 U/L (ref 0–44)
AST: 18 U/L (ref 15–41)
Albumin: 4.1 g/dL (ref 3.5–5.0)
Alkaline Phosphatase: 91 U/L (ref 38–126)
Anion gap: 10 (ref 5–15)
BUN: 34 mg/dL — ABNORMAL HIGH (ref 8–23)
CO2: 24 mmol/L (ref 22–32)
Calcium: 9.8 mg/dL (ref 8.9–10.3)
Chloride: 104 mmol/L (ref 98–111)
Creatinine: 1.15 mg/dL — ABNORMAL HIGH (ref 0.44–1.00)
GFR, Estimated: 46 mL/min — ABNORMAL LOW (ref >60)
Glucose, Bld: 96 mg/dL (ref 70–99)
Potassium: 4.1 mmol/L (ref 3.5–5.1)
Sodium: 138 mmol/L (ref 135–145)
Total Bilirubin: 0.8 mg/dL (ref <1.2)
Total Protein: 7.1 g/dL (ref 6.5–8.1)

## 2023-02-05 LAB — TSH: TSH: 0.043 u[IU]/mL — ABNORMAL LOW (ref 0.350–4.500)

## 2023-02-05 LAB — CEA (ACCESS): CEA (CHCC): 5.45 ng/mL — ABNORMAL HIGH (ref 0.00–5.00)

## 2023-02-05 NOTE — Telephone Encounter (Signed)
Patient gave verbal understanding and had no further questions or concerns  

## 2023-02-05 NOTE — Progress Notes (Signed)
[facility] OFFICE PROGRESS NOTE   Diagnosis: Colon cancer, anemia  INTERVAL HISTORY:   Ms. Neller returns as scheduled.  She is here with her daughter.  They report she has had recent symptoms related to heart failure.  She saw cardiology last week.  The diuretic regimen was adjusted and she is scheduled for an echocardiogram next week. She had an episode of bleeding when wiping from a bowel movement.  No other bleeding.  No change in arthritis symptoms.  Objective:  Vital signs in last 24 hours:  Blood pressure (!) 142/87, pulse 100, temperature 98.1 F (36.7 C), temperature source Temporal, resp. rate 18, height 5\' 2"  (1.575 m), weight 126 lb 14.4 oz (57.6 kg), SpO2 96%.    HEENT: The mouth is dry, no thrush Lymphatics: No cervical, supraclavicular, axillary, or inguinal nodes Resp: Scattered end inspiratory rhonchi and wheezes, no respiratory distress Cardio: Irregular GI: No mass, nontender, no hepatosplenomegaly Vascular: No leg edema   Lab Results:  Lab Results  Component Value Date   WBC 7.6 02/05/2023   HGB 11.5 (L) 02/05/2023   HCT 34.6 (L) 02/05/2023   MCV 102.4 (H) 02/05/2023   PLT 163 02/05/2023   NEUTROABS 4.5 02/05/2023    CMP  Lab Results  Component Value Date   NA 138 02/05/2023   K 4.1 02/05/2023   CL 104 02/05/2023   CO2 24 02/05/2023   GLUCOSE 96 02/05/2023   BUN 34 (H) 02/05/2023   CREATININE 1.15 (H) 02/05/2023   CALCIUM 9.8 02/05/2023   PROT 7.1 02/05/2023   ALBUMIN 4.1 02/05/2023   AST 18 02/05/2023   ALT 19 02/05/2023   ALKPHOS 91 02/05/2023   BILITOT 0.8 02/05/2023   GFRNONAA 46 (L) 02/05/2023   GFRAA 79.35 02/02/2021    Lab Results  Component Value Date   CEA1 8.2 (H) 03/07/2022   CEA 5.51 (H) 10/11/2022    Medications: I have reviewed the patient's current medications.   Assessment/Plan: Colon cancer-splenic flexure mass on colonoscopy 02/22/2021 Biopsy 02/22/2021-invasive well differentiated  adenocarcinoma, loss of MLH1 and PMS2 expression, MSI high, MLH1 hyper methylation present CTs 03/31/2021-no evidence of metastatic disease, possible mass at the splenic flexure, 4 mm left upper lung nodule, bilateral lung scarring Cycle 1 pembrolizumab 06/13/2021 Cycle 2 pembrolizumab 07/04/2021 Cycle 3 pembrolizumab 07/25/2021 Cycle 4 pembrolizumab 08/22/2021 Colonoscopy 09/20/2021-no residual mass at the splenic flexure/descending colon CT abdomen/pelvis 03/06/2022-diverticulosis without evidence of acute diverticulitis, partial colectomy Macrocytic anemia Sessile serrated polyp with focus of high-grade dysplasia on colonoscopy 02/22/2021 Colon cancer 2006, status post a right colectomy-"node positive "cancer per patient, received adjuvant chemotherapy Atrial fibrillation, maintained on anticoagulation History of a CVA and TIA Mild cognitive impairment Rheumatoid arthritis Hypothyroidism 10.  Chronic lung disease? 11.  Fall with head injury 10/22/2021 Scalp hematoma with bleeding 12/06/2021 12.  01/30/2022-displaced distal right fibular fracture, small displaced posterior malleolar fracture 13.  Admission 03/07/2022 with acute GI bleeding 14.  Fall with right distal fibular/malleolar fracture November 2023       Disposition: Ms. Yuhas is in clinical remission from colon cancer.  She has stable mild macrocytic anemia.  She is followed closely by Dr. Chales Abrahams and cardiology.  She will return for an office visit in 6 months.  I am available to see her sooner as needed.  We will ask Dr. Chales Abrahams to monitor her thyroid function.  We will follow-up on the CEA and thyroid panel from today.  Thornton Papas, MD  02/05/2023  12:48 PM

## 2023-02-05 NOTE — Telephone Encounter (Signed)
-----   Message from Thornton Papas sent at 02/05/2023  2:01 PM EST ----- Please call her, the CEA is stable, the TSH is low, the thyroid hormone replacement dose will likely need to be adjusted by Dr. Chales Abrahams, please forward the lab results to Dr. Chales Abrahams, follow-up as scheduled

## 2023-02-06 ENCOUNTER — Other Ambulatory Visit: Payer: Self-pay | Admitting: Internal Medicine

## 2023-02-06 ENCOUNTER — Telehealth: Payer: Self-pay | Admitting: Internal Medicine

## 2023-02-06 DIAGNOSIS — F01A0 Vascular dementia, mild, without behavioral disturbance, psychotic disturbance, mood disturbance, and anxiety: Secondary | ICD-10-CM | POA: Diagnosis not present

## 2023-02-06 DIAGNOSIS — R41841 Cognitive communication deficit: Secondary | ICD-10-CM | POA: Diagnosis not present

## 2023-02-06 MED ORDER — LEVOTHYROXINE SODIUM 75 MCG PO TABS: 75.0000 ug | ORAL_TABLET | Freq: Every day | ORAL | Status: DC

## 2023-02-06 NOTE — Telephone Encounter (Signed)
Her TSH is Low in Oncology Office Will reduce  Levothyroxine to 75 mcg Check TSH in 8 weeks

## 2023-02-07 DIAGNOSIS — R0602 Shortness of breath: Secondary | ICD-10-CM | POA: Diagnosis not present

## 2023-02-08 DIAGNOSIS — R41841 Cognitive communication deficit: Secondary | ICD-10-CM | POA: Diagnosis not present

## 2023-02-08 DIAGNOSIS — F01A0 Vascular dementia, mild, without behavioral disturbance, psychotic disturbance, mood disturbance, and anxiety: Secondary | ICD-10-CM | POA: Diagnosis not present

## 2023-02-12 ENCOUNTER — Ambulatory Visit (HOSPITAL_COMMUNITY): Payer: Medicare Other | Attending: Cardiovascular Disease

## 2023-02-12 DIAGNOSIS — R0602 Shortness of breath: Secondary | ICD-10-CM | POA: Diagnosis not present

## 2023-02-12 DIAGNOSIS — Z9889 Other specified postprocedural states: Secondary | ICD-10-CM | POA: Diagnosis not present

## 2023-02-12 DIAGNOSIS — E039 Hypothyroidism, unspecified: Secondary | ICD-10-CM | POA: Diagnosis not present

## 2023-02-12 LAB — ECHOCARDIOGRAM COMPLETE
Area-P 1/2: 2.65 cm2
MV M vel: 4.83 m/s
MV Peak grad: 93.3 mm[Hg]
MV VTI: 1.46 cm2
S' Lateral: 3.5 cm

## 2023-02-17 ENCOUNTER — Encounter: Payer: Self-pay | Admitting: Cardiovascular Disease

## 2023-02-18 ENCOUNTER — Encounter: Payer: Self-pay | Admitting: Internal Medicine

## 2023-02-18 ENCOUNTER — Ambulatory Visit: Payer: Medicare Other | Admitting: Cardiovascular Disease

## 2023-02-19 NOTE — Telephone Encounter (Signed)
It is not a problem to take Gemtesa and furosemide together.  The furosemide increases the total amount of urine produced.  The Gemtesa does not reduce the total amount of urine eliminated.  At least in theory, it allows the bladder to accumulate a larger volume of urine without needing to go immediately.  Her symptoms of urgency are typical for an "irritable bladder".

## 2023-02-24 ENCOUNTER — Encounter: Payer: Self-pay | Admitting: Internal Medicine

## 2023-02-25 ENCOUNTER — Telehealth: Payer: Self-pay | Admitting: Internal Medicine

## 2023-02-25 ENCOUNTER — Other Ambulatory Visit: Payer: Self-pay | Admitting: Internal Medicine

## 2023-02-25 NOTE — Telephone Encounter (Signed)
Discussed with the daughter There is some concern that patient starts having Diarrhea every time she gets Lasix and Potassium So will try her without the Potassium and use Diet like Banana for Supplement Repeat BMP in 2 weeks Also Discontinue Psyllium husk for now

## 2023-02-26 NOTE — Telephone Encounter (Signed)
Mahlon Gammon, MD  You19 hours ago (4:18 PM)    Discussed with the daughter

## 2023-03-07 ENCOUNTER — Ambulatory Visit: Payer: Self-pay | Admitting: Physician Assistant

## 2023-03-11 DIAGNOSIS — I509 Heart failure, unspecified: Secondary | ICD-10-CM | POA: Diagnosis not present

## 2023-03-17 ENCOUNTER — Encounter: Payer: Self-pay | Admitting: Cardiovascular Disease

## 2023-03-17 ENCOUNTER — Encounter: Payer: Self-pay | Admitting: Internal Medicine

## 2023-03-21 NOTE — Telephone Encounter (Signed)
I hope that stopping the sodium chloride tablets and focusing on improving the hyponatremia by restricting water rather than adding salt will lead to lower requirements of diuretics like Lasix.  The Eliquis cannot be incriminated in any way as contributing to her problems with fluid retention.

## 2023-04-04 ENCOUNTER — Non-Acute Institutional Stay (SKILLED_NURSING_FACILITY): Payer: Self-pay | Admitting: Adult Health

## 2023-04-04 DIAGNOSIS — F0150 Vascular dementia without behavioral disturbance: Secondary | ICD-10-CM | POA: Diagnosis not present

## 2023-04-04 DIAGNOSIS — G9341 Metabolic encephalopathy: Secondary | ICD-10-CM | POA: Diagnosis not present

## 2023-04-04 DIAGNOSIS — I10 Essential (primary) hypertension: Secondary | ICD-10-CM

## 2023-04-04 DIAGNOSIS — M06049 Rheumatoid arthritis without rheumatoid factor, unspecified hand: Secondary | ICD-10-CM

## 2023-04-04 DIAGNOSIS — I4821 Permanent atrial fibrillation: Secondary | ICD-10-CM | POA: Diagnosis not present

## 2023-04-04 DIAGNOSIS — E039 Hypothyroidism, unspecified: Secondary | ICD-10-CM | POA: Diagnosis not present

## 2023-04-04 DIAGNOSIS — E785 Hyperlipidemia, unspecified: Secondary | ICD-10-CM | POA: Diagnosis not present

## 2023-04-04 DIAGNOSIS — D509 Iron deficiency anemia, unspecified: Secondary | ICD-10-CM | POA: Diagnosis not present

## 2023-04-04 DIAGNOSIS — C185 Malignant neoplasm of splenic flexure: Secondary | ICD-10-CM | POA: Diagnosis not present

## 2023-04-04 DIAGNOSIS — E782 Mixed hyperlipidemia: Secondary | ICD-10-CM

## 2023-04-04 DIAGNOSIS — R0602 Shortness of breath: Secondary | ICD-10-CM

## 2023-04-04 DIAGNOSIS — N12 Tubulo-interstitial nephritis, not specified as acute or chronic: Secondary | ICD-10-CM | POA: Diagnosis not present

## 2023-04-04 LAB — CBC AND DIFFERENTIAL
EGFR: 65
HCT: 33 — AB (ref 36–46)
Hemoglobin: 11 — AB (ref 12.0–16.0)
Platelets: 144 10*3/uL — AB (ref 150–400)
WBC: 5.8

## 2023-04-04 LAB — LIPID PANEL
Cholesterol: 108 (ref 0–200)
HDL: 49 (ref 35–70)
LDL Cholesterol: 41
LDl/HDL Ratio: 2.2
Triglycerides: 93 (ref 40–160)

## 2023-04-04 LAB — TSH: TSH: 12.6 — AB (ref 0.41–5.90)

## 2023-04-04 LAB — CBC: RBC: 3.17 — AB (ref 3.87–5.11)

## 2023-04-05 ENCOUNTER — Encounter: Payer: Self-pay | Admitting: Adult Health

## 2023-04-05 NOTE — Progress Notes (Signed)
 Location:  [facility] Service:  SNF (31) Provider:  Tawni Darlean CARROLYN Charlanne Fredia LITTIE, MD  Patient Care Team: Charlanne Fredia LITTIE, MD as PCP - General (Internal Medicine) Francyne Headland, MD as PCP - Cardiology (Cardiology) [name], DO as Consulting Physician (Neurology)  Extended Emergency Contact Information Primary Emergency Contact: [name],[name] Address: [address]          [place], [place] [address] [place] Home Phone: [phone] Mobile Phone: [phone] Relation: Daughter Secondary Emergency Contact: [name] Address: [address]          [place], [place] [address] [place] Phone: [phone] Relation: Son  Code Status:  DNR Goals of care: Advanced Directive information    02/05/2023   12:10 PM  Advanced Directives  Does Patient Have a Medical Advance Directive? Yes  Type of Estate Agent of [place];Living will  Does patient want to make changes to medical advance directive? No - Patient declined  Copy of Healthcare Power of Attorney in Chart? Yes - validated most recent copy scanned in chart (See row information)     Chief Complaint  Patient presents with   Medical Management of Chronic Issues    HPI:  Pt is a 89 y.o. female seen today for medical management of chronic diseases.    PMH: hx of colon ca in 2006 with recurrence. Noted by colonoscopy at splenic flexure 02/22/21, bx showed well differentiated adenocarcinoma.  Had 4 cycles of pembrolizumab . Colonoscopy 09/20/21 showed no residual mass.    Also hx of RA on Arava , afib off eliquis  due to bleeding risk, OAB, TIA, CVA, HTN, ILD, Gerd, GIB, Hypothyroidism, OA, B12 def, mitral and tricuspid valve repair, HLD, anemia, depression, constipation.   Hypothyroidism: TSH 12.6 04/04/23, prior result 0.043 02/05/23 (dose reduced to 75 mcg)  HLD: :LDL 41 TC 108 04/04/23  Anemia: Hgb 11 MCV 103.2 04/04/23  BUN/Cr  25.3 0.85 K 4.4 NA 137 04/04/23  Seen by cardiology on 12/14/22 with bigeminy. Taken off digoxin . Placed back on eliquis  for CVA risk prevention and metoprolol  increased.  Seen in Nov 2024 sodium tabs stopped, lasix  added to keep weight at 125 lbs due to overload.  Echo repeated.  2D echo 2020: 55-60% severe LAE, mitral valve repair, flattend intraventricular septum c/w right pressure overload. She uses the lasix  prn given 12/21, 12/31, 1/4, 1/9.  Causes frequency for her which is bothersome.    Dementia: followed by neurology, prior hx of CVA/afib MMSE 15/30. Remains pleasant and ambulatory. Needs cuing and reminders.    Past Medical History:  Diagnosis Date   Allergy    seasonal   Anemia    Anxiety    Arthritis    Back   Asymptomatic menopausal state    Atrial fibrillation (HCC)    Cancer (HCC) 2006   Colon.  Basal Cell Skin cancer- right arm   Cataract    removed bilateral   Chronic atrial fibrillation (HCC)    Colon cancer (HCC)    Constipation due to pain medication therapy    after heart surgery   Deficiency of other specified B group vitamins    Diplopia    Disorientation, unspecified    Dysrhythmia    PAF   GERD (gastroesophageal reflux disease)    Heart murmur    Hyperlipidemia    Hypertension    Hypothyroidism    Malignant neoplasm of colon, unspecified (HCC)    Other specified diseases of  blood and blood-forming organs    Other specified disorders of bone density and structure, unspecified forearm    Other thrombophilia (HCC)    per matrix   Overactive bladder    per Matrix   Personal history of other malignant neoplasm of large intestine    Presbycusis, bilateral    per matrix   RA (rheumatoid arthritis) (HCC)    Restless leg    Seizures (HCC)    after Heart Surgey   Stroke (HCC)    TIA- found by neurologist after    Valgus deformity, not elsewhere classified, right knee    per matrix   Vascular dementia, mild, without behavioral disturbance,  psychotic disturbance, mood disturbance, and anxiety (HCC)    per matrix   Past Surgical History:  Procedure Laterality Date   ABDOMINAL HYSTERECTOMY  1970   Partial    COLON RESECTION  2006   cancer   COLON SURGERY     COLONOSCOPY     EYE SURGERY Bilateral    Cataract   MAXIMUM ACCESS (MAS)POSTERIOR LUMBAR INTERBODY FUSION (PLIF) 2 LEVEL N/A 04/12/2015   Procedure: Lumbar Three-Five Decompression, Pedicle Screw Fixation, and Posteriolateral Arthrodesis;  Surgeon: Fairy Levels, MD;  Location: [facility];  Service: Neurosurgery;  Laterality: N/A;  L3-4 L4-5 Maximum access posterior lumbar fusion, possible interbodies and resection of synovial cyst at L4-5   MITRAL VALVE REPAIR  01/20/2013   Gore-tex cords to P1, P2, and P3. Magic suture to posterior medial commisure, #30 Physio 1 ring. Done in [place]    TONSILLECTOMY     about 1940   TRICUSPID VALVE SURGERY  01/20/2013   #28 TriAd ring done in [place]     Allergies  Allergen Reactions   Claritin  [Loratadine ] Other (See Comments)    Listed on MAR Unknown reaction   Codeine Other (See Comments)    just don't take it well   Gabapentin  Other (See Comments)    Dizziness    Molds & Smuts Other (See Comments)    Dust.  Reaction is not listed on MAR    Pollen Extract Swelling    Grass   Bee Venom Swelling and Rash   Wasp Venom Swelling and Rash    Outpatient Encounter Medications as of 04/04/2023  Medication Sig   acetaminophen  (TYLENOL ) 500 MG tablet Take 1,000 mg by mouth in the morning and at bedtime.   apixaban  (ELIQUIS ) 2.5 MG TABS tablet Take 1 tablet (2.5 mg total) by mouth 2 (two) times daily.   atorvastatin  (LIPITOR) 20 MG tablet TAKE 1 TABLET DAILY   Cholecalciferol  (VITAMIN D ) 50 MCG (2000 UT) CAPS Take 2,000 Units by mouth in the morning.   cloNIDine  (CATAPRES ) 0.1 MG tablet Take 1 tablet (0.1 mg total) by mouth daily as needed. If SBP >180 only   furosemide  (LASIX ) 20 MG tablet Take 20 mg daily until weight is 124  pounds. Then take 20 mg daily ONLY AS NEEDED for weight of 125 pounds or greater.   Iron , Ferrous Sulfate , 325 (65 Fe) MG TABS Take 325 mg by mouth daily.   leflunomide  (ARAVA ) 20 MG tablet Take 20 mg by mouth in the morning.   levalbuterol  (XOPENEX ) 0.63 MG/3ML nebulizer solution Take 0.63 mg by nebulization 2 (two) times daily as needed for wheezing or shortness of breath.   levothyroxine  (SYNTHROID ) 75 MCG tablet Take 1 tablet (75 mcg total) by mouth daily.   mirtazapine  (REMERON ) 15 MG tablet TAKE 1 TABLET AT BEDTIME   Multiple Vitamin (MULTIVITAMIN ADULT  PO) Take 1 tablet by mouth every morning.   Multiple Vitamins-Minerals (PRESERVISION AREDS 2) CAPS Take 1 capsule by mouth in the morning and at bedtime.   omeprazole  (PRILOSEC) 40 MG capsule TAKE 1 CAPSULE DAILY   valsartan  (DIOVAN ) 160 MG tablet Take 160 mg by mouth daily.   Vibegron  (GEMTESA ) 75 MG TABS Take 75 mg by mouth daily.   vitamin B-12 (CYANOCOBALAMIN ) 1000 MCG tablet Take 1,000 mcg by mouth in the morning.   No facility-administered encounter medications on file as of 04/04/2023.    Review of Systems  Constitutional:  Negative for activity change, appetite change, chills, diaphoresis, fatigue, fever and unexpected weight change.  HENT:  Negative for congestion.   Respiratory:  Positive for shortness of breath (on exertion). Negative for cough and wheezing.   Cardiovascular:  Positive for leg swelling. Negative for chest pain and palpitations.  Gastrointestinal:  Negative for abdominal distention, abdominal pain, constipation and diarrhea.  Genitourinary:  Positive for frequency. Negative for difficulty urinating and dysuria.  Musculoskeletal:  Positive for arthralgias and gait problem. Negative for back pain, joint swelling and myalgias.  Neurological:  Negative for dizziness, tremors, seizures, syncope, facial asymmetry, speech difficulty, weakness, light-headedness, numbness and headaches.  Psychiatric/Behavioral:  Positive  for confusion. Negative for agitation and behavioral problems.     Immunization History  Administered Date(s) Administered   Fluad Quad(high Dose 65+) 01/05/2019, 12/07/2021, 01/15/2023   H1N1 04/07/2008   Influenza Whole 12/29/2008, 12/22/2009   Influenza, High Dose Seasonal PF 01/15/2012, 12/23/2014, 01/22/2020   Influenza,inj,Quad PF,6+ Mos 01/22/2014, 12/10/2016, 01/17/2018   Influenza,trivalent, recombinat, inj, PF 01/05/2016   Influenza-Unspecified 04/07/2008, 12/15/2014, 01/13/2021   Moderna Covid-19 Vaccine  Bivalent Booster 59yrs & up 01/29/2022   Moderna Sars-Covid-2 Vaccination 04/07/2019, 05/05/2019, 02/09/2020   PFIZER(Purple Top)SARS-COV-2 Vaccination 01/06/2021   PPD Test 07/17/2010, 04/19/2015, 04/25/2015   Pfizer Covid-19 Vaccine Bivalent Booster 50yrs & up 07/19/2022   Pneumococcal Conjugate-13 11/10/2013   Pneumococcal Polysaccharide-23 12/27/2010, 12/05/2016   RSV,unspecified 03/28/2022   Tdap 04/15/2017   Zoster Recombinant(Shingrix) 04/09/2017, 06/18/2017   Pertinent  Health Maintenance Due  Topic Date Due   INFLUENZA VACCINE  Completed   DEXA SCAN  Completed      03/22/2022   11:45 AM 05/21/2022    1:06 PM 06/06/2022    4:55 PM 07/09/2022    1:44 PM 08/29/2022    3:31 PM  Fall Risk  Falls in the past year? 1 1 1  0 1  Was there an injury with Fall? 1 1 1  0 1  Fall Risk Category Calculator 3 3 3  0 2  Fall Risk Category (Retired) High      Patient at Risk for Falls Due to History of fall(s) History of fall(s);Impaired balance/gait;Impaired mobility History of fall(s);Impaired balance/gait;Impaired mobility History of fall(s);Impaired balance/gait;Impaired mobility   Fall risk Follow up Falls evaluation completed Falls evaluation completed Falls evaluation completed;Education provided;Falls prevention discussed Falls evaluation completed Falls evaluation completed   Functional Status Survey:    Vitals:   04/05/23 1117  BP: (!) 142/88  Pulse: 66  Resp: 17   Temp: (!) 97.2 F (36.2 C)  SpO2: 95%  Weight: 125 lb 4.8 oz (56.8 kg)     Body mass index is 22.92 kg/m. Physical Exam Vitals and nursing note reviewed.  Constitutional:      General: She is not in acute distress.    Appearance: She is not diaphoretic.  HENT:     Head: Normocephalic and atraumatic.  Neck:  Vascular: No JVD.  Cardiovascular:     Rate and Rhythm: Normal rate. Rhythm irregular.     Heart sounds: No murmur heard. Pulmonary:     Effort: Pulmonary effort is normal. No respiratory distress.     Breath sounds: Wheezing present.  Abdominal:     General: Bowel sounds are normal. There is no distension.     Palpations: Abdomen is soft.     Tenderness: There is no abdominal tenderness.  Musculoskeletal:     Comments: Trace edema to BLE  Skin:    General: Skin is warm and dry.  Neurological:     Mental Status: She is alert. Mental status is at baseline.     Labs reviewed: Recent Labs    06/22/22 1619 08/06/22 1327 10/18/22 0000 12/31/22 0000 01/07/23 0000 02/05/23 1146  NA 136 137   < > 135* 137 138  K 4.7 4.1   < > 4.0 4.0 4.1  CL 99 106   < > 104 102 104  CO2 22 23   < > 22 23* 24  GLUCOSE 100* 106*  --   --   --  96  BUN 18 19   < > 18 23* 34*  CREATININE 0.72 0.76   < > 0.9 0.9 1.15*  CALCIUM  9.5 9.4   < > 8.8 8.9 9.8   < > = values in this interval not displayed.   Recent Labs    10/18/22 0000 12/31/22 0000 02/05/23 1146  AST 17 26 18   ALT 16 36* 19  ALKPHOS 90 104 91  BILITOT  --   --  0.8  PROT  --   --  7.1  ALBUMIN 4.4 4.1 4.1   Recent Labs    08/06/22 1327 10/11/22 1405 10/18/22 0000 10/30/22 0000 11/05/22 0000 12/31/22 0000 02/05/23 1146  WBC 7.6 9.9 8.1   < > 8.4 6.4 7.6  NEUTROABS  --  5.7 4.90  --   --  3.70 4.5  HGB 11.7* 11.9* 12.2   < > 11.6* 10.6* 11.5*  HCT 35.2* 35.6* 36   < > 34* 31* 34.6*  MCV 100.9* 102.6*  --   --   --   --  102.4*  PLT 170 193 188   < > 169 157 163   < > = values in this interval not  displayed.   Lab Results  Component Value Date   TSH 0.043 (L) 02/05/2023   Lab Results  Component Value Date   HGBA1C 5.5 01/12/2013   Lab Results  Component Value Date   CHOL 115 02/02/2021   HDL 48 02/02/2021   LDLCALC 49 02/02/2021   TRIG 91 02/02/2021    Significant Diagnostic Results in last 30 days:  No results found.  Assessment/Plan  1. Acquired hypothyroidism (Primary) Repeat TSH 6 weeks due to slight elevation Readings have been variable.   2. Mixed hyperlipidemia Continues statin  3. Essential hypertension Controlled  4. Permanent atrial fibrillation (HCC) Rate controlled Off dig On Eliquis  for CVA risk reduction Followed by cardiology  5. Iron  deficiency anemia, unspecified iron  deficiency anemia type Continue iron , followed by heme/onc  6. Vascular dementia without behavioral disturbance (HCC) Doing well in skilled care with support   7. Rheumatoid arthritis involving hand with negative rheumatoid factor, unspecified laterality (HCC) Followed by rheumatology on Arava    8. Malignant neoplasm of splenic flexure (HCC) In remission per Dr cloretta  9. SOB (shortness of breath) On exertion Hx of ILD and  CHF Using lasix  prn BMP OK    Family/ staff Communication: nurse  Labs/tests ordered: TSH 6 weeks

## 2023-04-17 ENCOUNTER — Encounter: Payer: Self-pay | Admitting: Cardiovascular Disease

## 2023-04-17 ENCOUNTER — Ambulatory Visit: Payer: Medicare Other | Attending: Cardiovascular Disease | Admitting: Cardiovascular Disease

## 2023-04-17 VITALS — BP 138/66 | HR 92 | Ht 62.0 in | Wt 126.6 lb

## 2023-04-17 DIAGNOSIS — I10 Essential (primary) hypertension: Secondary | ICD-10-CM | POA: Diagnosis not present

## 2023-04-17 DIAGNOSIS — Z9889 Other specified postprocedural states: Secondary | ICD-10-CM | POA: Diagnosis not present

## 2023-04-17 DIAGNOSIS — E039 Hypothyroidism, unspecified: Secondary | ICD-10-CM | POA: Insufficient documentation

## 2023-04-17 DIAGNOSIS — D6869 Other thrombophilia: Secondary | ICD-10-CM | POA: Diagnosis not present

## 2023-04-17 DIAGNOSIS — E871 Hypo-osmolality and hyponatremia: Secondary | ICD-10-CM | POA: Insufficient documentation

## 2023-04-17 DIAGNOSIS — I4821 Permanent atrial fibrillation: Secondary | ICD-10-CM | POA: Insufficient documentation

## 2023-04-17 DIAGNOSIS — I5032 Chronic diastolic (congestive) heart failure: Secondary | ICD-10-CM | POA: Diagnosis not present

## 2023-04-17 DIAGNOSIS — E7800 Pure hypercholesterolemia, unspecified: Secondary | ICD-10-CM | POA: Insufficient documentation

## 2023-04-17 NOTE — Patient Instructions (Signed)
Medication Instructions:  No changes *If you need a refill on your cardiac medications before your next appointment, please call your pharmacy*  Follow-Up: At [facility], you and your health needs are our priority.  As part of our continuing mission to provide you with exceptional heart care, we have created designated Provider Care Teams.  These Care Teams include your primary Cardiologist (physician) and Advanced Practice Providers (APPs -  Physician Assistants and Nurse Practitioners) who all work together to provide you with the care you need, when you need it.  We recommend signing up for the patient portal called "MyChart".  Sign up information is provided on this After Visit Summary.  MyChart is used to connect with patients for Virtual Visits (Telemedicine).  Patients are able to view lab/test results, encounter notes, upcoming appointments, etc.  Non-urgent messages can be sent to your provider as well.   To learn more about what you can do with MyChart, go to [email or web address].    Your next appointment:   1 year(s)  Provider:   Dr Croitoru  

## 2023-04-17 NOTE — Progress Notes (Signed)
Speech Cardiology Office Note:  .   Date:  04/17/2023  ID:  Kathy Velez, DOB 05/09/1931, MRN [id number] PCP: Mahlon Gammon, MD  [facility] Providers Cardiologist:  Thurmon Fair, MD    History of Present Illness: .   Kathy Velez is a 89 y.o. female with paroxysmal atrial fibrillation, history of stroke, vascular dementia, carotid artery disease, aortic atherosclerosis, hypertension, history of mitral and tricuspid valve repair (MV #30 Physio Ring; TV # 28 TriAd Ring 2014, [place]), chronic interstitial lung disease, history of colon cancer in remission, GERD, treated hypothyroidism, hyponatremia (probable SIADH).  She is accompanied by her daughter Kathy Velez.  She lives at [facility], and the skilled nursing facility due to her worsening memory issues.  After stopping the salt tablets and placing her on 2000 mL fluid restriction and a weight-based furosemide prescription, her breathing has improved (it has returned back to her NYHA functional class II baseline from about 6 months ago) and her sodium level has normalized.  She takes furosemide if her weight is 125 pounds or higher, which happens on the average every other day.  She frequently has mild edema, with her elastic socks leaving some marks.  She does not have orthopnea or PND.  She has not had any dizziness or syncope and has not had any falls in over a year.  She does not add salt to her food but does sometimes have salty foods such as bacon at breakfast.  She has vascular dementia: still knows her daughter and grandchildren and is able to hold a coherent conversation.  Does not appear short of breath at rest.  She does have a history of several TIAs in the past and one major stroke presenting with a fall and aphasia in January 2021.  CT angiography at that time showed no significant large vessel stenoses in the cerebral circulation but there was mild atherosclerotic calcification scattered throughout.  Last performed about a week  ago showed normal electrolytes (sodium 137) and renal function parameters but an elevated TSH at approximately 12.  All other lipid parameters are excellent.  Studies Reviewed: .        Echocardiogram 02/12/2023    1. Mitral Valve repair: Gore-tex cords to P1, P2, and P3. Magic suture to  posterior medial commisure, #30 Physio 1 ring. Done in [place]. Tricuspid  Valve repair #28 Triad ring done in [place]. Procedure Date: 01/20/2013.   2. Right ventricular systolic function is mildly reduced. The right  ventricular size is moderately enlarged. There is moderately elevated  pulmonary artery systolic pressure.   3. The aortic valve is tricuspid. Aortic valve regurgitation is not  visualized. No aortic stenosis is present.   4. Mildly dilated pulmonary artery.   5. Left ventricular ejection fraction, by estimation, is 55 to 60%. Left  ventricular ejection fraction by 3D volume is 54 %. The left ventricle has  normal function. The left ventricle demonstrates global hypokinesis. Left  ventricular diastolic function  could not be evaluated. There is the interventricular septum is flattened  in systole, consistent with right ventricular pressure overload.   6. Right atrial size was severely dilated.   7. Left atrial size was severely dilated.   8. The tricuspid valve is status post repair with an annuloplasty ring.   Comparison(s): No significant change from prior study. Prior images  reviewed side by side.   Labs from [facility]  04/04/2023 creatinine 0.85, sodium 137, potassium 4.4, normal liver function tests, mildly reduced osmolality 278 (lab normal  range 285-295), hemoglobin 11.0 with macrocytic indices normal WBC and platelet counts, TSH 12.6 Cholesterol 108, triglycerides 93, HDL 49, LDL 41  Risk Assessment/Calculations:    CHA2DS2-VASc Score = 8   This indicates a 10.8% annual risk of stroke. The patient's score is based upon: CHF History: 1 HTN History: 1 Diabetes  History: 0 Stroke History: 2 Vascular Disease History: 1 Age Score: 2 Gender Score: 1            Physical Exam:   VS:  BP 138/66   Pulse 92   Ht 5\' 2"  (1.575 m)   Wt 126 lb 9.6 oz (57.4 kg)   SpO2 91%   BMI 23.16 kg/m    Wt Readings from Last 3 Encounters:  04/17/23 126 lb 9.6 oz (57.4 kg)  04/05/23 125 lb 4.8 oz (56.8 kg)  02/05/23 126 lb 14.4 oz (57.6 kg)    GEN: Well nourished, well developed in no acute distress NECK: 3-4 cm JVD; No carotid bruits CARDIAC: irregular, no murmurs, rubs, gallops RESPIRATORY:  Clear to auscultation without rales, wheezing or rhonchi  ABDOMEN: Soft, non-tender, non-distended EXTREMITIES: 1+ pitting pedal, ankle and pretibial edema bilaterally  ASSESSMENT AND PLAN: .    1. Permanent atrial fibrillation (HCC)   2. Acquired thrombophilia (HCC)   3. Chronic diastolic heart failure (HCC)   4. Hyponatremia   5. Status post mitral valve repair   6. Essential hypertension   7. Hypercholesterolemia   8. Acquired hypothyroidism        AFib: Permanent arrhythmia.  Severe biatrial dilation.  On beta-blockers with adequate rate control.   Anticoagulation: No recent falls or bleeding problems.  Eliquis dose adjusted for age and small body size. Stroke risk: Severely elevated risk with a CHA2DS2-VASc score of 8 and previous history of stroke.  No falls in over a year and no serious bleeding since we started Eliquis. CHF: Mild signs of hypervolemia on exam.  Continues to have NYHA functional class 2-3 dyspnea.    Continue fluid restriction, daily weights and weight-based furosemide prescription (take 20 mg on days when weight is greater than 124 pounds). Hyponatremia: Has resolved.  S/P MV and TV repair: No angina echo findings.  Repairs are holding up nicely.  No mitral stenosis. HTN: Adequately controlled.  Continue same medications. HLP: All recent lipid parameters are within target range. Hypothyroidism: Recent TSH was elevated at 12 while  taking levothyroxine 75 mcg daily.  Not longer though she had a somewhat suppressed TSH when taking 100 mcg daily.  It may be that the optimal dose is 88 mcg daily.  Leave that decision to Dr. Chales Abrahams.  Aware of the need to take the medication on an empty stomach.       Dispo: No change in medications, fluid restriction and daily weight monitoring recommendations.  Consider increasing levothyroxine dose 88 mcg daily.  Signed, Thurmon Fair, MD

## 2023-04-18 ENCOUNTER — Ambulatory Visit (INDEPENDENT_AMBULATORY_CARE_PROVIDER_SITE_OTHER): Payer: Medicare Other | Admitting: Physician Assistant

## 2023-04-18 ENCOUNTER — Encounter: Payer: Self-pay | Admitting: Physician Assistant

## 2023-04-18 VITALS — BP 141/80 | HR 88 | Resp 18

## 2023-04-18 DIAGNOSIS — F01A0 Vascular dementia, mild, without behavioral disturbance, psychotic disturbance, mood disturbance, and anxiety: Secondary | ICD-10-CM | POA: Diagnosis not present

## 2023-04-18 NOTE — Progress Notes (Signed)
Assessment/Plan:   Dementia likely due to mixed Alzheimer's and vascular disease, without behavioral disturbance   Kathy Velez is a delightful 89 y.o. RH female with a history ofTIAs and left frontal lobe infarct likely cardioembolic, cerebral amyloid angiopathy, atrial fibrillation not on anticoagulation due to HASBLED score of 3 and recurrent falls, SIADH, hypothyroidism, ILD, RA, history of colon cancer in remission, hypertension, hyperlipidemia , B12 deficiency,seen today in follow up for memory loss.  In the past she was on donepezil but she had serious side effects (diarrhea).  She never started on rivastigmine due to insurance issues.  Given her advanced age, multiple risk factors, no further antidementia medication is indicated, as the risk outweigh the benefits.  Her memory is stable, with MMSE 26/30.  Discussed the patient's status with her daughter, and given her stable presentation, she prefers to be seen every year unless worsening of memory were noted.  It is felt that this is improving decision, agree with the plan.   Follow up in 1 year Recommend good control of her cardiovascular risk factors.  Continue Eliquis, follow-up with cardiology Continue to control mood as per PCP Recommend PT-OT for strength and balance, cognitive stimulation Continue 24/7 monitoring.     Subjective:    This patient is accompanied in the office by her daughter  who supplements the history.  Previous records as well as any outside records available were reviewed prior to todays visit. Patient was last seen on 08/29/2022    Any changes in memory since last visit? "Better, more clear, since moving to SNF, the system seems to help her"-daughter says  She has less difficulty with short-term memory. She has been more interactive and conversant than prior. Repeats oneself? "Denies" Disoriented when walking into a room?  Not frequently, especially on her routine Leaving objects?  May misplace things but  not in unusual places   Wandering behavior?  denies   Any personality changes since last visit?  denies   Any worsening depression? Denies Hallucinations or paranoia?  Denies.   Seizures? denies    Any sleep changes?  Sleeps well,from 9 pm -7:30 am. Denies vivid dreams, REM behavior or sleepwalking   Sleep apnea?   Denies.   Any hygiene concerns? Denies. She has a schedule.  Independent of bathing and dressing?  Endorsed  Does the patient needs help with medications? facility is in charge Who is in charge of the finances?   Daughter is in charge Any changes in appetite?  denies that she always watches what she eats. Patient have trouble swallowing? Denies.   Does the patient cook? No Any headaches?   denies   Chronic back pain  denies   Ambulates with difficulty?  She uses the walker in the apartment, when she is out she uses the wheelchair.  She has difficulty getting up "very difficult to get up without using the arms of the chair.  Recent falls or head injuries? denies     Unilateral weakness, numbness or tingling? denies   Any tremors?  Denies   Any anosmia?  Denies   Any incontinence of urine?  Endorsed, wears diapers.  She has a history of recurrent UTIs.  She has a history of SIADH-hyponatremia treated with salt tablets.  Her last chart notes her sodium has been stable Any bowel dysfunction?  He has a history of diarrhea. Patient lives at SNF Does the patient drive? No longer drives.    PREVIOUS MEDICATIONS: ACHI (severe diarrhea)  CURRENT MEDICATIONS:  Outpatient Encounter Medications as of 04/18/2023  Medication Sig   acetaminophen (TYLENOL) 500 MG tablet Take 1,000 mg by mouth in the morning and at bedtime.   amoxicillin (AMOXIL) 500 MG capsule Take 1,000 mg by mouth 2 (two) times daily.   apixaban (ELIQUIS) 2.5 MG TABS tablet Take 1 tablet (2.5 mg total) by mouth 2 (two) times daily.   atorvastatin (LIPITOR) 20 MG tablet TAKE 1 TABLET DAILY   Cholecalciferol (VITAMIN D)  50 MCG (2000 UT) CAPS Take 2,000 Units by mouth in the morning.   cloNIDine (CATAPRES) 0.1 MG tablet Take 1 tablet (0.1 mg total) by mouth daily as needed. If SBP >180 only   furosemide (LASIX) 20 MG tablet Take 20 mg daily until weight is 124 pounds. Then take 20 mg daily ONLY AS NEEDED for weight of 125 pounds or greater.   Iron, Ferrous Sulfate, 325 (65 Fe) MG TABS Take 325 mg by mouth daily.   leflunomide (ARAVA) 20 MG tablet Take 20 mg by mouth in the morning.   levalbuterol (XOPENEX) 0.63 MG/3ML nebulizer solution Take 0.63 mg by nebulization 2 (two) times daily as needed for wheezing or shortness of breath.   levothyroxine (SYNTHROID) 75 MCG tablet Take 1 tablet (75 mcg total) by mouth daily.   mirtazapine (REMERON) 15 MG tablet TAKE 1 TABLET AT BEDTIME   Multiple Vitamin (MULTIVITAMIN ADULT PO) Take 1 tablet by mouth every morning.   Multiple Vitamins-Minerals (PRESERVISION AREDS 2) CAPS Take 1 capsule by mouth in the morning and at bedtime.   omeprazole (PRILOSEC) 40 MG capsule TAKE 1 CAPSULE DAILY   valsartan (DIOVAN) 160 MG tablet Take 160 mg by mouth daily.   Vibegron (GEMTESA) 75 MG TABS Take 75 mg by mouth daily.   vitamin B-12 (CYANOCOBALAMIN) 1000 MCG tablet Take 1,000 mcg by mouth in the morning.   metoprolol succinate (TOPROL-XL) 100 MG 24 hr tablet Take 1 tablet (100 mg total) by mouth daily. Take with or immediately following a meal. (Patient taking differently: Take 100 mg by mouth daily. Take 150mg  with or immediately following a meal.)   No facility-administered encounter medications on file as of 04/18/2023.       04/18/2023    1:00 PM 08/30/2022    8:00 AM 03/22/2022   12:27 PM  MMSE - Mini Mental State Exam  Orientation to time 3 4 4   Orientation to Place 5 5 5   Registration 3 3 3   Attention/ Calculation 5 3 3   Recall 1 1 1   Language- name 2 objects 2 1 2   Language- repeat 1 1 1   Language- follow 3 step command 3 3 3   Language- read & follow direction 1 1 1    Write a sentence 1 0 1  Copy design 1 1 1   Total score 26 23 25        No data to display          Objective:     PHYSICAL EXAMINATION:    VITALS:   Vitals:   04/18/23 1336  BP: (!) 141/80  Pulse: 88  Resp: 18  SpO2: 95%    GEN:  The patient appears stated age and is in NAD. HEENT:  Normocephalic, atraumatic.   Neurological examination:  General: NAD, well-groomed, appears stated age. Orientation: The patient is alert. Oriented to person, place and not to date Cranial nerves: There is good facial symmetry.The speech is fluent and clear, slow paced. No aphasia or dysarthria. Fund of knowledge is reduced. Recent and remote  memory are impaired. Attention and concentration are reduced.  Able to name objects and repeat phrases.  Hearing is intact to conversational tone.   Sensation: Sensation is intact to light touch throughout Motor: Strength is at least antigravity x4. DTR's 2/4 in UE/LE     Movement examination: Tone: There is normal tone in the UE/LE Abnormal movements:  no tremor.  No myoclonus.  No asterixis.   Coordination:  There is no decremation with RAM's. Normal finger to nose  Gait and Station: The patient has some difficulty arising out of a deep-seated chair without the use of the hands.  She needs support and being held, unable to test stride and gait as patient returned to her wheelchair due to discomfort  Thank you for allowing [place] the opportunity to participate in the care of this nice patient. Please do not hesitate to contact us for any questions or concerns.   Total time spent on today's visit was 35 minutes dedicated to this patient today, preparing to see patient, examining the patient, ordering tests and/or medications and counseling the patient, documenting clinical information in the EHR or other health record, independently interpreting results and communicating results to the patient/family, discussing treatment and goals, answering patient's  questions and coordinating care.  Cc:  Mahlon Gammon, MD  Marlowe Kays 04/18/2023 1:38 PM

## 2023-04-18 NOTE — Patient Instructions (Signed)
It was a pleasure to see you today at our office.   Recommendations:   Follow in 1 year   If there is increased confusion, think of UTI first     For assessment of decision of mental capacity and competency:  Call Dr. Erick Blinks, geriatric psychiatrist at [phone]  Counseling regarding caregiver distress, including caregiver depression, anxiety and issues regarding community resources, adult day care programs, adult living facilities, or memory care questions:  please contact your  Primary Doctor's Social Worker   Whom to call: Memory  decline, memory medications: Call our office [phone]  For psychiatric meds, mood meds: Please have your primary care physician manage these medications.  If you have any severe symptoms of a stroke, or other severe issues such as confusion,severe chills or fever, etc call 911 or go to the ER as you may need to be evaluated further       RECOMMENDATIONS FOR ALL PATIENTS WITH MEMORY PROBLEMS: 1. Continue to exercise (Recommend 30 minutes of walking everyday, or 3 hours every week) 2. Increase social interactions - continue going to [place] and enjoy social gatherings with friends and family 3. Eat healthy, avoid fried foods and eat more fruits and vegetables 4. Maintain adequate blood pressure, blood sugar, and blood cholesterol level. Reducing the risk of stroke and cardiovascular disease also helps promoting better memory. 5. Avoid stressful situations. Live a simple life and avoid aggravations. Organize your time and prepare for the next day in anticipation. 6. Sleep well, avoid any interruptions of sleep and avoid any distractions in the bedroom that may interfere with adequate sleep quality 7. Avoid sugar, avoid sweets as there is a strong link between excessive sugar intake, diabetes, and cognitive impairment We discussed the Mediterranean diet, which has been shown to help patients reduce the risk of progressive memory disorders and reduces  cardiovascular risk. This includes eating fish, eat fruits and green leafy vegetables, nuts like almonds and hazelnuts, walnuts, and also use olive oil. Avoid fast foods and fried foods as much as possible. Avoid sweets and sugar as sugar use has been linked to worsening of memory function.  There is always a concern of gradual progression of memory problems. If this is the case, then we may need to adjust level of care according to patient needs. Support, both to the patient and caregiver, should then be put into place.    FALL PRECAUTIONS: Be cautious when walking. Scan the area for obstacles that may increase the risk of trips and falls. When getting up in the mornings, sit up at the edge of the bed for a few minutes before getting out of bed. Consider elevating the bed at the head end to avoid drop of blood pressure when getting up. Walk always in a well-lit room (use night lights in the walls). Avoid area rugs or power cords from appliances in the middle of the walkways. Use a walker or a cane if necessary and consider physical therapy for balance exercise. Get your eyesight checked regularly.  FINANCIAL OVERSIGHT: Supervision, especially oversight when making financial decisions or transactions is also recommended.  HOME SAFETY: Consider the safety of the kitchen when operating appliances like stoves, microwave oven, and blender. Consider having supervision and share cooking responsibilities until no longer able to participate in those. Accidents with firearms and other hazards in the house should be identified and addressed as well.   ABILITY TO BE LEFT ALONE: If patient is unable to contact 911 operator, consider using  [employer], or when the need is there, arrange for someone to stay with patients. Smoking is a fire hazard, consider supervision or cessation. Risk of wandering should be assessed by caregiver and if detected at any point, supervision and safe proof recommendations should be  instituted.  MEDICATION SUPERVISION: Inability to self-administer medication needs to be constantly addressed. Implement a mechanism to ensure safe administration of the medications.

## 2023-04-22 ENCOUNTER — Encounter: Payer: Self-pay | Admitting: Adult Health

## 2023-04-22 ENCOUNTER — Non-Acute Institutional Stay (SKILLED_NURSING_FACILITY): Payer: Medicare Other | Admitting: Adult Health

## 2023-04-22 DIAGNOSIS — R062 Wheezing: Secondary | ICD-10-CM | POA: Diagnosis not present

## 2023-04-22 DIAGNOSIS — J101 Influenza due to other identified influenza virus with other respiratory manifestations: Secondary | ICD-10-CM

## 2023-04-22 MED ORDER — OSELTAMIVIR PHOSPHATE 75 MG PO CAPS: 75.0000 mg | ORAL_CAPSULE | Freq: Two times a day (BID) | ORAL | Status: AC

## 2023-04-22 MED ORDER — GUAIFENESIN-DM 100-10 MG/5ML PO SYRP: 20.0000 mL | ORAL_SOLUTION | Freq: Two times a day (BID) | ORAL | Status: AC

## 2023-04-22 MED ORDER — METOPROLOL SUCCINATE ER 100 MG PO TB24: 100.0000 mg | ORAL_TABLET | Freq: Every day | ORAL | Status: DC

## 2023-04-22 NOTE — Progress Notes (Signed)
Location:  [facility] Room Number: 129A Place of Service:  SNF [phone]) Provider:  Tamsen Roers, MD  Patient Care Team: Mahlon Gammon, MD as PCP - General (Internal Medicine) Croitoru, Rachelle Hora, MD as PCP - Cardiology (Cardiology) Drema Dallas, DO as Consulting Physician (Neurology)  Extended Emergency Contact Information Primary Emergency Contact: [name],[name] Address: [address]          [place], [place] [address] Darden Amber of [place] Home Phone: [phone] Mobile Phone: [phone] Relation: Daughter Secondary Emergency Contact: Hollie Salk Address: [address]          [place], [place] [address] D[place] of [place] Mobile Phone: [phone] Relation: Son  Code Status:  DNR Goals of care: Advanced Directive information    04/22/2023   11:01 AM  Advanced Directives  Does Patient Have a Medical Advance Directive? Yes  Type of Advance Directive Healthcare Power of Attorney  Does patient want to make changes to medical advance directive? No - Patient declined  Copy of Healthcare Power of Attorney in Chart? No - copy requested     Chief Complaint  Patient presents with   Acute Visit    Patient is being seen for acute vomiting    HPI:  Pt is a 89 y.o. female seen today for an acute visit for vomiting and congestion.   The nurse reports Ms. Roderic Scarce had two vomiting episodes in the last 24 hrs. Also she is having nasal congestion and cough. Flu swab (rapid) was ordered and is positive for flu A. Covid swab is negative.   The nurse checked her 02 sat and it was 88-90% so 2 liters of oxygen was applied. She is having some mild wheezing. Has some chronic SOB on exertion which is unchanged.  She is not having any further vomiting or nausea. Mostly complains of a cough and hoarseness.   Past Medical History:  Diagnosis Date   Allergy    seasonal   Anemia    Anxiety    Arthritis    Back   Asymptomatic  menopausal state    Atrial fibrillation (HCC)    Cancer (HCC) 2006   Colon.  Basal Cell Skin cancer- right arm   Cataract    removed bilateral   Chronic atrial fibrillation (HCC)    Colon cancer (HCC)    Constipation due to pain medication therapy    after heart surgery   Deficiency of other specified B group vitamins    Diplopia    Disorientation, unspecified    Dysrhythmia    PAF   GERD (gastroesophageal reflux disease)    Heart murmur    Hyperlipidemia    Hypertension    Hypothyroidism    Malignant neoplasm of colon, unspecified (HCC)    Other specified diseases of blood and blood-forming organs    Other specified disorders of bone density and structure, unspecified forearm    Other thrombophilia (HCC)    per matrix   Overactive bladder    per Matrix   Personal history of other malignant neoplasm of large intestine    Presbycusis, bilateral    per matrix   RA (rheumatoid arthritis) (HCC)    Restless leg    Seizures (HCC)    after Heart Surgey   Stroke (HCC)    TIA- found by neurologist after    Valgus deformity, not elsewhere classified, right knee    per matrix   Vascular dementia, mild, without behavioral disturbance, psychotic disturbance, mood  disturbance, and anxiety (HCC)    per matrix   Past Surgical History:  Procedure Laterality Date   ABDOMINAL HYSTERECTOMY  1970   Partial    COLON RESECTION  2006   cancer   COLON SURGERY     COLONOSCOPY     EYE SURGERY Bilateral    Cataract   MAXIMUM ACCESS (MAS)POSTERIOR LUMBAR INTERBODY FUSION (PLIF) 2 LEVEL N/A 04/12/2015   Procedure: Lumbar Three-Five Decompression, Pedicle Screw Fixation, and Posteriolateral Arthrodesis;  Surgeon: Maeola Harman, MD;  Location: [facility];  Service: Neurosurgery;  Laterality: N/A;  L3-4 L4-5 Maximum access posterior lumbar fusion, possible interbodies and resection of synovial cyst at L4-5   MITRAL VALVE REPAIR  01/20/2013   Gore-tex cords to P1, P2, and P3. Magic suture to  posterior medial commisure, #30 Physio 1 ring. Done in [place]   TONSILLECTOMY     about 1940   TRICUSPID VALVE SURGERY  01/20/2013   #28 TriAd ring done in [place]    Allergies  Allergen Reactions   Claritin [Loratadine] Other (See Comments)    Listed on MAR Unknown reaction   Codeine Other (See Comments)    "just don't take it well"   Gabapentin Other (See Comments)    Dizziness    Molds & Smuts Other (See Comments)    Dust.  Reaction is not listed on MAR    Pollen Extract Swelling    Grass   Bee Venom Swelling and Rash   Wasp Venom Swelling and Rash    Outpatient Encounter Medications as of 04/22/2023  Medication Sig   acetaminophen (TYLENOL) 500 MG tablet Take 1,000 mg by mouth in the morning and at bedtime.   amoxicillin (AMOXIL) 500 MG capsule Take 1,000 mg by mouth 2 (two) times daily.   apixaban (ELIQUIS) 2.5 MG TABS tablet Take 1 tablet (2.5 mg total) by mouth 2 (two) times daily.   atorvastatin (LIPITOR) 20 MG tablet TAKE 1 TABLET DAILY   Cholecalciferol (VITAMIN D) 50 MCG (2000 UT) CAPS Take 2,000 Units by mouth in the morning.   cloNIDine (CATAPRES) 0.1 MG tablet Take 1 tablet (0.1 mg total) by mouth daily as needed. If SBP >180 only   furosemide (LASIX) 20 MG tablet Take 20 mg daily until weight is 124 pounds. Then take 20 mg daily ONLY AS NEEDED for weight of 125 pounds or greater.   Iron, Ferrous Sulfate, 325 (65 Fe) MG TABS Take 325 mg by mouth daily.   leflunomide (ARAVA) 20 MG tablet Take 20 mg by mouth in the morning.   levalbuterol (XOPENEX) 0.63 MG/3ML nebulizer solution Take 0.63 mg by nebulization 2 (two) times daily as needed for wheezing or shortness of breath.   levothyroxine (SYNTHROID) 75 MCG tablet Take 1 tablet (75 mcg total) by mouth daily.   mirtazapine (REMERON) 15 MG tablet TAKE 1 TABLET AT BEDTIME   Multiple Vitamin (MULTIVITAMIN ADULT PO) Take 1 tablet by mouth every morning.   Multiple Vitamins-Minerals (PRESERVISION AREDS 2) CAPS Take 1  capsule by mouth in the morning and at bedtime.   omeprazole (PRILOSEC) 40 MG capsule TAKE 1 CAPSULE DAILY   valsartan (DIOVAN) 160 MG tablet Take 160 mg by mouth daily.   Vibegron (GEMTESA) 75 MG TABS Take 75 mg by mouth daily.   vitamin B-12 (CYANOCOBALAMIN) 1000 MCG tablet Take 1,000 mcg by mouth in the morning.   metoprolol succinate (TOPROL-XL) 100 MG 24 hr tablet Take 1 tablet (100 mg total) by mouth daily. Take with or  immediately following a meal. (Patient taking differently: Take 100 mg by mouth daily. Take 150mg  with or immediately following a meal.)   No facility-administered encounter medications on file as of 04/22/2023.    Review of Systems  Constitutional:  Negative for activity change, appetite change, chills, diaphoresis, fatigue, fever and unexpected weight change.  HENT:  Positive for congestion, hearing loss, rhinorrhea and sore throat. Negative for ear pain, facial swelling, sinus pressure, sinus pain and trouble swallowing.   Respiratory:  Positive for cough, shortness of breath (on exertion chronic and unchanged) and wheezing.   Cardiovascular:  Positive for leg swelling (chronic unchanged). Negative for chest pain and palpitations.  Gastrointestinal:  Negative for abdominal distention, abdominal pain, constipation, diarrhea and nausea.       N/v resolved.   Genitourinary:  Negative for difficulty urinating and dysuria.  Musculoskeletal:  Positive for arthralgias and gait problem. Negative for back pain, joint swelling and myalgias.  Neurological:  Negative for dizziness, tremors, seizures, syncope, facial asymmetry, speech difficulty, weakness, light-headedness, numbness and headaches.  Psychiatric/Behavioral:  Negative for agitation, behavioral problems and confusion.     Immunization History  Administered Date(s) Administered   Fluad Quad(high Dose 65+) 01/05/2019, 12/07/2021, 01/15/2023   Fluad Trivalent(High Dose 65+) 01/15/2023   H1N1 04/07/2008   Influenza  Whole 12/29/2008, 12/22/2009   Influenza, High Dose Seasonal PF 01/15/2012, 12/23/2014, 01/22/2020   Influenza,inj,Quad PF,6+ Mos 01/22/2014, 12/10/2016, 01/17/2018   Influenza,trivalent, recombinat, inj, PF 01/05/2016   Influenza-Unspecified 04/07/2008, 12/15/2014, 01/13/2021   Moderna Covid-19 Vaccine Bivalent Booster 77yrs & up 01/29/2022, 01/15/2023   Moderna Sars-Covid-2 Vaccination 04/07/2019, 05/05/2019, 02/09/2020   PFIZER(Purple Top)SARS-COV-2 Vaccination 01/06/2021   PPD Test 07/17/2010, 04/19/2015, 04/25/2015   Pfizer Covid-19 Vaccine Bivalent Booster 22yrs & up 07/19/2022   Pneumococcal Conjugate-13 11/10/2013   Pneumococcal Polysaccharide-23 12/27/2010, 12/05/2016   RSV,unspecified 03/28/2022   Tdap 04/15/2017   Zoster Recombinant(Shingrix) 04/09/2017, 06/18/2017   Pertinent  Health Maintenance Due  Topic Date Due   INFLUENZA VACCINE  Completed   DEXA SCAN  Completed      05/21/2022    1:06 PM 06/06/2022    4:55 PM 07/09/2022    1:44 PM 08/29/2022    3:31 PM 04/18/2023   12:52 PM  Fall Risk  Falls in the past year? 1 1 0 1 0  Was there an injury with Fall? 1 1 0 1 0  Fall Risk Category Calculator 3 3 0 2 0  Patient at Risk for Falls Due to History of fall(s);Impaired balance/gait;Impaired mobility History of fall(s);Impaired balance/gait;Impaired mobility History of fall(s);Impaired balance/gait;Impaired mobility    Fall risk Follow up Falls evaluation completed Falls evaluation completed;Education provided;Falls prevention discussed Falls evaluation completed Falls evaluation completed Falls evaluation completed   Functional Status Survey:    Vitals:   04/22/23 1046  BP: (!) 145/87  Pulse: 96  Resp: 20  Temp: 97.6 F (36.4 C)  TempSrc: Temporal  SpO2: 95%  Weight: 125 lb (56.7 kg)  Height: 5\' 2"  (1.575 m)   Body mass index is 22.86 kg/m. Physical Exam Vitals and nursing note reviewed.  Constitutional:      General: She is not in acute distress.     Appearance: She is not diaphoretic.  HENT:     Head: Normocephalic and atraumatic.     Mouth/Throat:     Mouth: Mucous membranes are moist.     Pharynx: Oropharynx is clear. No oropharyngeal exudate.  Neck:     Vascular: No JVD.  Cardiovascular:  Rate and Rhythm: Normal rate. Rhythm irregular.     Heart sounds: No murmur heard. Pulmonary:     Effort: Pulmonary effort is normal. No respiratory distress.     Breath sounds: Wheezing present.  Abdominal:     General: Abdomen is flat. Bowel sounds are normal. There is no distension.     Palpations: Abdomen is soft.     Tenderness: There is no abdominal tenderness.  Musculoskeletal:     Comments: Trace edema bilateral ankles R>L  Skin:    General: Skin is warm and dry.  Neurological:     Mental Status: She is alert and oriented to person, place, and time. Mental status is at baseline.     Labs reviewed: Recent Labs    06/22/22 1619 08/06/22 1327 10/18/22 0000 12/31/22 0000 01/07/23 0000 02/05/23 1146  NA 136 137   < > 135* 137 138  K 4.7 4.1   < > 4.0 4.0 4.1  CL 99 106   < > 104 102 104  CO2 22 23   < > 22 23* 24  GLUCOSE 100* 106*  --   --   --  96  BUN 18 19   < > 18 23* 34*  CREATININE 0.72 0.76   < > 0.9 0.9 1.15*  CALCIUM 9.5 9.4   < > 8.8 8.9 9.8   < > = values in this interval not displayed.   Recent Labs    10/18/22 0000 12/31/22 0000 02/05/23 1146  AST 17 26 18   ALT 16 36* 19  ALKPHOS 90 104 91  BILITOT  --   --  0.8  PROT  --   --  7.1  ALBUMIN 4.4 4.1 4.1   Recent Labs    08/06/22 1327 10/11/22 1405 10/18/22 0000 10/30/22 0000 11/05/22 0000 12/31/22 0000 02/05/23 1146  WBC 7.6 9.9 8.1   < > 8.4 6.4 7.6  NEUTROABS  --  5.7 4.90  --   --  3.70 4.5  HGB 11.7* 11.9* 12.2   < > 11.6* 10.6* 11.5*  HCT 35.2* 35.6* 36   < > 34* 31* 34.6*  MCV 100.9* 102.6*  --   --   --   --  102.4*  PLT 170 193 188   < > 169 157 163   < > = values in this interval not displayed.   Lab Results  Component  Value Date   TSH 0.043 (L) 02/05/2023   Lab Results  Component Value Date   HGBA1C 5.5 01/12/2013   Lab Results  Component Value Date   CHOL 115 02/02/2021   HDL 48 02/02/2021   LDLCALC 49 02/02/2021   TRIG 91 02/02/2021    Significant Diagnostic Results in last 30 days:  No results found.  Assessment/Plan  1. Influenza A (Primary) Isolation for 7 days due to living in skilled care environment  - oseltamivir (TAMIFLU) 75 MG capsule; Take 1 capsule (75 mg total) by mouth 2 (two) times daily for 5 days.  - guaiFENesin-dextromethorphan (ROBITUSSIN DM) 100-10 MG/5ML syrup; Take 20 mLs by mouth in the morning and at bedtime for 5 days. And q 6 prn  02 2 liters if needed If not improving or worsening check chest xray   2. Wheezing Xopenex BID for 5 days and q 6 prn  Staff to monitor resp status.     Labs/tests ordered:  NA

## 2023-04-25 DIAGNOSIS — J8410 Pulmonary fibrosis, unspecified: Secondary | ICD-10-CM | POA: Diagnosis not present

## 2023-04-25 DIAGNOSIS — J9811 Atelectasis: Secondary | ICD-10-CM | POA: Diagnosis not present

## 2023-05-01 DIAGNOSIS — M62561 Muscle wasting and atrophy, not elsewhere classified, right lower leg: Secondary | ICD-10-CM | POA: Diagnosis not present

## 2023-05-01 DIAGNOSIS — F01A0 Vascular dementia, mild, without behavioral disturbance, psychotic disturbance, mood disturbance, and anxiety: Secondary | ICD-10-CM | POA: Diagnosis not present

## 2023-05-01 DIAGNOSIS — R2689 Other abnormalities of gait and mobility: Secondary | ICD-10-CM | POA: Diagnosis not present

## 2023-05-01 DIAGNOSIS — R41841 Cognitive communication deficit: Secondary | ICD-10-CM | POA: Diagnosis not present

## 2023-05-01 DIAGNOSIS — J101 Influenza due to other identified influenza virus with other respiratory manifestations: Secondary | ICD-10-CM | POA: Diagnosis not present

## 2023-05-01 DIAGNOSIS — F0150 Vascular dementia without behavioral disturbance: Secondary | ICD-10-CM | POA: Diagnosis not present

## 2023-05-01 DIAGNOSIS — M62562 Muscle wasting and atrophy, not elsewhere classified, left lower leg: Secondary | ICD-10-CM | POA: Diagnosis not present

## 2023-05-01 DIAGNOSIS — M6289 Other specified disorders of muscle: Secondary | ICD-10-CM | POA: Diagnosis not present

## 2023-05-02 DIAGNOSIS — M62561 Muscle wasting and atrophy, not elsewhere classified, right lower leg: Secondary | ICD-10-CM | POA: Diagnosis not present

## 2023-05-02 DIAGNOSIS — R2689 Other abnormalities of gait and mobility: Secondary | ICD-10-CM | POA: Diagnosis not present

## 2023-05-02 DIAGNOSIS — F01A0 Vascular dementia, mild, without behavioral disturbance, psychotic disturbance, mood disturbance, and anxiety: Secondary | ICD-10-CM | POA: Diagnosis not present

## 2023-05-02 DIAGNOSIS — R41841 Cognitive communication deficit: Secondary | ICD-10-CM | POA: Diagnosis not present

## 2023-05-02 DIAGNOSIS — F0150 Vascular dementia without behavioral disturbance: Secondary | ICD-10-CM | POA: Diagnosis not present

## 2023-05-02 DIAGNOSIS — M6289 Other specified disorders of muscle: Secondary | ICD-10-CM | POA: Diagnosis not present

## 2023-05-03 DIAGNOSIS — F0150 Vascular dementia without behavioral disturbance: Secondary | ICD-10-CM | POA: Diagnosis not present

## 2023-05-03 DIAGNOSIS — M6289 Other specified disorders of muscle: Secondary | ICD-10-CM | POA: Diagnosis not present

## 2023-05-03 DIAGNOSIS — M62561 Muscle wasting and atrophy, not elsewhere classified, right lower leg: Secondary | ICD-10-CM | POA: Diagnosis not present

## 2023-05-03 DIAGNOSIS — F01A0 Vascular dementia, mild, without behavioral disturbance, psychotic disturbance, mood disturbance, and anxiety: Secondary | ICD-10-CM | POA: Diagnosis not present

## 2023-05-03 DIAGNOSIS — R2689 Other abnormalities of gait and mobility: Secondary | ICD-10-CM | POA: Diagnosis not present

## 2023-05-03 DIAGNOSIS — R41841 Cognitive communication deficit: Secondary | ICD-10-CM | POA: Diagnosis not present

## 2023-05-06 DIAGNOSIS — M6289 Other specified disorders of muscle: Secondary | ICD-10-CM | POA: Diagnosis not present

## 2023-05-06 DIAGNOSIS — F01A0 Vascular dementia, mild, without behavioral disturbance, psychotic disturbance, mood disturbance, and anxiety: Secondary | ICD-10-CM | POA: Diagnosis not present

## 2023-05-06 DIAGNOSIS — M62561 Muscle wasting and atrophy, not elsewhere classified, right lower leg: Secondary | ICD-10-CM | POA: Diagnosis not present

## 2023-05-06 DIAGNOSIS — R41841 Cognitive communication deficit: Secondary | ICD-10-CM | POA: Diagnosis not present

## 2023-05-06 DIAGNOSIS — R2689 Other abnormalities of gait and mobility: Secondary | ICD-10-CM | POA: Diagnosis not present

## 2023-05-06 DIAGNOSIS — F0150 Vascular dementia without behavioral disturbance: Secondary | ICD-10-CM | POA: Diagnosis not present

## 2023-05-07 DIAGNOSIS — M6289 Other specified disorders of muscle: Secondary | ICD-10-CM | POA: Diagnosis not present

## 2023-05-07 DIAGNOSIS — R41841 Cognitive communication deficit: Secondary | ICD-10-CM | POA: Diagnosis not present

## 2023-05-07 DIAGNOSIS — R2689 Other abnormalities of gait and mobility: Secondary | ICD-10-CM | POA: Diagnosis not present

## 2023-05-07 DIAGNOSIS — M62561 Muscle wasting and atrophy, not elsewhere classified, right lower leg: Secondary | ICD-10-CM | POA: Diagnosis not present

## 2023-05-07 DIAGNOSIS — F01A0 Vascular dementia, mild, without behavioral disturbance, psychotic disturbance, mood disturbance, and anxiety: Secondary | ICD-10-CM | POA: Diagnosis not present

## 2023-05-07 DIAGNOSIS — F0150 Vascular dementia without behavioral disturbance: Secondary | ICD-10-CM | POA: Diagnosis not present

## 2023-05-08 DIAGNOSIS — R2689 Other abnormalities of gait and mobility: Secondary | ICD-10-CM | POA: Diagnosis not present

## 2023-05-08 DIAGNOSIS — F0150 Vascular dementia without behavioral disturbance: Secondary | ICD-10-CM | POA: Diagnosis not present

## 2023-05-08 DIAGNOSIS — F01A0 Vascular dementia, mild, without behavioral disturbance, psychotic disturbance, mood disturbance, and anxiety: Secondary | ICD-10-CM | POA: Diagnosis not present

## 2023-05-08 DIAGNOSIS — M62561 Muscle wasting and atrophy, not elsewhere classified, right lower leg: Secondary | ICD-10-CM | POA: Diagnosis not present

## 2023-05-08 DIAGNOSIS — R41841 Cognitive communication deficit: Secondary | ICD-10-CM | POA: Diagnosis not present

## 2023-05-08 DIAGNOSIS — M6289 Other specified disorders of muscle: Secondary | ICD-10-CM | POA: Diagnosis not present

## 2023-05-09 DIAGNOSIS — R41841 Cognitive communication deficit: Secondary | ICD-10-CM | POA: Diagnosis not present

## 2023-05-09 DIAGNOSIS — M6289 Other specified disorders of muscle: Secondary | ICD-10-CM | POA: Diagnosis not present

## 2023-05-09 DIAGNOSIS — M62561 Muscle wasting and atrophy, not elsewhere classified, right lower leg: Secondary | ICD-10-CM | POA: Diagnosis not present

## 2023-05-09 DIAGNOSIS — F0150 Vascular dementia without behavioral disturbance: Secondary | ICD-10-CM | POA: Diagnosis not present

## 2023-05-09 DIAGNOSIS — R2689 Other abnormalities of gait and mobility: Secondary | ICD-10-CM | POA: Diagnosis not present

## 2023-05-09 DIAGNOSIS — F01A0 Vascular dementia, mild, without behavioral disturbance, psychotic disturbance, mood disturbance, and anxiety: Secondary | ICD-10-CM | POA: Diagnosis not present

## 2023-05-10 DIAGNOSIS — M62561 Muscle wasting and atrophy, not elsewhere classified, right lower leg: Secondary | ICD-10-CM | POA: Diagnosis not present

## 2023-05-10 DIAGNOSIS — F0150 Vascular dementia without behavioral disturbance: Secondary | ICD-10-CM | POA: Diagnosis not present

## 2023-05-10 DIAGNOSIS — M6289 Other specified disorders of muscle: Secondary | ICD-10-CM | POA: Diagnosis not present

## 2023-05-10 DIAGNOSIS — R2689 Other abnormalities of gait and mobility: Secondary | ICD-10-CM | POA: Diagnosis not present

## 2023-05-10 DIAGNOSIS — F01A0 Vascular dementia, mild, without behavioral disturbance, psychotic disturbance, mood disturbance, and anxiety: Secondary | ICD-10-CM | POA: Diagnosis not present

## 2023-05-10 DIAGNOSIS — R41841 Cognitive communication deficit: Secondary | ICD-10-CM | POA: Diagnosis not present

## 2023-05-13 DIAGNOSIS — M6289 Other specified disorders of muscle: Secondary | ICD-10-CM | POA: Diagnosis not present

## 2023-05-13 DIAGNOSIS — F0150 Vascular dementia without behavioral disturbance: Secondary | ICD-10-CM | POA: Diagnosis not present

## 2023-05-13 DIAGNOSIS — M62561 Muscle wasting and atrophy, not elsewhere classified, right lower leg: Secondary | ICD-10-CM | POA: Diagnosis not present

## 2023-05-13 DIAGNOSIS — F01A0 Vascular dementia, mild, without behavioral disturbance, psychotic disturbance, mood disturbance, and anxiety: Secondary | ICD-10-CM | POA: Diagnosis not present

## 2023-05-13 DIAGNOSIS — R2689 Other abnormalities of gait and mobility: Secondary | ICD-10-CM | POA: Diagnosis not present

## 2023-05-13 DIAGNOSIS — R41841 Cognitive communication deficit: Secondary | ICD-10-CM | POA: Diagnosis not present

## 2023-05-14 DIAGNOSIS — R2689 Other abnormalities of gait and mobility: Secondary | ICD-10-CM | POA: Diagnosis not present

## 2023-05-14 DIAGNOSIS — R41841 Cognitive communication deficit: Secondary | ICD-10-CM | POA: Diagnosis not present

## 2023-05-14 DIAGNOSIS — F0150 Vascular dementia without behavioral disturbance: Secondary | ICD-10-CM | POA: Diagnosis not present

## 2023-05-14 DIAGNOSIS — F01A0 Vascular dementia, mild, without behavioral disturbance, psychotic disturbance, mood disturbance, and anxiety: Secondary | ICD-10-CM | POA: Diagnosis not present

## 2023-05-14 DIAGNOSIS — M62561 Muscle wasting and atrophy, not elsewhere classified, right lower leg: Secondary | ICD-10-CM | POA: Diagnosis not present

## 2023-05-14 DIAGNOSIS — M6289 Other specified disorders of muscle: Secondary | ICD-10-CM | POA: Diagnosis not present

## 2023-05-17 ENCOUNTER — Non-Acute Institutional Stay (SKILLED_NURSING_FACILITY): Payer: Medicare Other | Admitting: Adult Health

## 2023-05-17 ENCOUNTER — Encounter: Payer: Self-pay | Admitting: Adult Health

## 2023-05-17 DIAGNOSIS — R410 Disorientation, unspecified: Secondary | ICD-10-CM

## 2023-05-17 DIAGNOSIS — I517 Cardiomegaly: Secondary | ICD-10-CM | POA: Diagnosis not present

## 2023-05-17 DIAGNOSIS — I5032 Chronic diastolic (congestive) heart failure: Secondary | ICD-10-CM

## 2023-05-17 DIAGNOSIS — R0602 Shortness of breath: Secondary | ICD-10-CM

## 2023-05-17 DIAGNOSIS — E039 Hypothyroidism, unspecified: Secondary | ICD-10-CM | POA: Diagnosis not present

## 2023-05-17 DIAGNOSIS — R0989 Other specified symptoms and signs involving the circulatory and respiratory systems: Secondary | ICD-10-CM | POA: Diagnosis not present

## 2023-05-17 MED ORDER — GUAIFENESIN-DM 100-10 MG/5ML PO SYRP: 20.0000 mL | ORAL_SOLUTION | Freq: Four times a day (QID) | ORAL | Status: DC | PRN

## 2023-05-17 MED ORDER — LEVALBUTEROL HCL 0.63 MG/3ML IN NEBU: 0.6300 mg | INHALATION_SOLUTION | Freq: Four times a day (QID) | RESPIRATORY_TRACT | Status: DC | PRN

## 2023-05-17 NOTE — Progress Notes (Signed)
Location:  Medical illustrator of Service:  SNF (31) Provider:   Peggye Ley, ANP [facility] [phone]   Mahlon Gammon, MD  Patient Care Team: Mahlon Gammon, MD as PCP - General (Internal Medicine) Croitoru, Rachelle Hora, MD as PCP - Cardiology (Cardiology) Drema Dallas, DO as Consulting Physician (Neurology)  Extended Emergency Contact Information Primary Emergency Contact: [name],[name] Address: [address]          [place], [place] [address] Darden Amber of [place] Home Phone: [phone] Mobile Phone: [phone] Relation: Daughter Secondary Emergency Contact: Hollie Salk Address: [address]          [place], [place] [address] Darden Amber of [place] Mobile Phone: [phone] Relation: Son  Code Status:  DNR Goals of care: Advanced Directive information    04/22/2023   11:01 AM  Advanced Directives  Does Patient Have a Medical Advance Directive? Yes  Type of Advance Directive Healthcare Power of Attorney  Does patient want to make changes to medical advance directive? No - Patient declined  Copy of Healthcare Power of Attorney in Chart? No - copy requested     Chief Complaint  Patient presents with   Acute Visit    Sob and confusion     History of Present Illness  The patient is an 89 year old female with atrial fibrillation, dementia, and chronic diastolic heart failure who presents with shortness of breath and confusion.  Her past medical history includes atrial fibrillation, dementia, cerebrovascular accident, hypertension, interstitial lung disease, rheumatoid arthritis, chronic diastolic heart failure, gastrointestinal bleed, colon cancer, gastroesophageal reflux disease, hypothyroidism, and anemia. She was treated for influenza A with Tamiflu recently, and a chest x-ray was normal. She is currently on Eliquis. She has a history of urinary tract infections and hyponatremia.  She experiences shortness of breath that  is more pronounced than her baseline. Her respiratory rate is elevated, and her oxygen saturation is 93 on room air. She has a history of chronic shortness of breath on exertion related to her heart failure and atrial fibrillation, but this episode is worse than usual. Her nurse reports that her cough has improved, and she has no nasal congestion or gastrointestinal symptoms. There are no other sick contacts.  She exhibits increased confusion, appearing more confused than her baseline, which is characterized by being slow to respond and requiring prompting and cueing. Despite eating a good breakfast, she requested to return to bed and appeared weak.    Results RADIOLOGY Chest x-ray: Normal (04/25/2023)   Past Medical History:  Diagnosis Date   Allergy    seasonal   Anemia    Anxiety    Arthritis    Back   Asymptomatic menopausal state    Atrial fibrillation (HCC)    Cancer (HCC) 2006   Colon.  Basal Cell Skin cancer- right arm   Cataract    removed bilateral   Chronic atrial fibrillation (HCC)    Colon cancer (HCC)    Constipation due to pain medication therapy    after heart surgery   Deficiency of other specified B group vitamins    Diplopia    Disorientation, unspecified    Dysrhythmia    PAF   GERD (gastroesophageal reflux disease)    Heart murmur    Hyperlipidemia    Hypertension    Hypothyroidism    Malignant neoplasm of colon, unspecified (HCC)    Other specified diseases of blood and blood-forming organs    Other specified disorders  of bone density and structure, unspecified forearm    Other thrombophilia (HCC)    per matrix   Overactive bladder    per Matrix   Personal history of other malignant neoplasm of large intestine    Presbycusis, bilateral    per matrix   RA (rheumatoid arthritis) (HCC)    Restless leg    Seizures (HCC)    after Heart Surgey   Stroke (HCC)    TIA- found by neurologist after    Valgus deformity, not elsewhere classified, right  knee    per matrix   Vascular dementia, mild, without behavioral disturbance, psychotic disturbance, mood disturbance, and anxiety (HCC)    per matrix   Past Surgical History:  Procedure Laterality Date   ABDOMINAL HYSTERECTOMY  1970   Partial    COLON RESECTION  2006   cancer   COLON SURGERY     COLONOSCOPY     EYE SURGERY Bilateral    Cataract   MAXIMUM ACCESS (MAS)POSTERIOR LUMBAR INTERBODY FUSION (PLIF) 2 LEVEL N/A 04/12/2015   Procedure: Lumbar Three-Five Decompression, Pedicle Screw Fixation, and Posteriolateral Arthrodesis;  Surgeon: Maeola Harman, MD;  Location: [facility];  Service: Neurosurgery;  Laterality: N/A;  L3-4 L4-5 Maximum access posterior lumbar fusion, possible interbodies and resection of synovial cyst at L4-5   MITRAL VALVE REPAIR  01/20/2013   Gore-tex cords to P1, P2, and P3. Magic suture to posterior medial commisure, #30 Physio 1 ring. Done in [place]   TONSILLECTOMY     about 1940   TRICUSPID VALVE SURGERY  01/20/2013   #28 TriAd ring done in [place]    Allergies  Allergen Reactions   Claritin [Loratadine] Other (See Comments)    Listed on MAR Unknown reaction   Codeine Other (See Comments)    "just don't take it well"   Gabapentin Other (See Comments)    Dizziness    Molds & Smuts Other (See Comments)    Dust.  Reaction is not listed on MAR    Pollen Extract Swelling    Grass   Bee Venom Swelling and Rash   Wasp Venom Swelling and Rash    Outpatient Encounter Medications as of 05/17/2023  Medication Sig   guaiFENesin-dextromethorphan (ROBITUSSIN DM) 100-10 MG/5ML syrup Take 20 mLs by mouth every 6 (six) hours as needed for cough.   levalbuterol (XOPENEX) 0.63 MG/3ML nebulizer solution Take 3 mLs (0.63 mg total) by nebulization every 6 (six) hours as needed for wheezing or shortness of breath.   acetaminophen (TYLENOL) 500 MG tablet Take 1,000 mg by mouth in the morning and at bedtime.   amoxicillin (AMOXIL) 500 MG capsule Take 1,000 mg by  mouth 2 (two) times daily.   apixaban (ELIQUIS) 2.5 MG TABS tablet Take 1 tablet (2.5 mg total) by mouth 2 (two) times daily.   atorvastatin (LIPITOR) 20 MG tablet TAKE 1 TABLET DAILY   Cholecalciferol (VITAMIN D) 50 MCG (2000 UT) CAPS Take 2,000 Units by mouth in the morning.   cloNIDine (CATAPRES) 0.1 MG tablet Take 1 tablet (0.1 mg total) by mouth daily as needed. If SBP >180 only   furosemide (LASIX) 20 MG tablet Take 20 mg daily until weight is 124 pounds. Then take 20 mg daily ONLY AS NEEDED for weight of 125 pounds or greater.   Iron, Ferrous Sulfate, 325 (65 Fe) MG TABS Take 325 mg by mouth daily.   leflunomide (ARAVA) 20 MG tablet Take 20 mg by mouth in the morning.   levothyroxine (SYNTHROID)  75 MCG tablet Take 1 tablet (75 mcg total) by mouth daily.   metoprolol succinate (TOPROL-XL) 100 MG 24 hr tablet Take 1 tablet (100 mg total) by mouth daily. Take 150mg  with or immediately following a meal.   mirtazapine (REMERON) 15 MG tablet TAKE 1 TABLET AT BEDTIME   Multiple Vitamin (MULTIVITAMIN ADULT PO) Take 1 tablet by mouth every morning.   Multiple Vitamins-Minerals (PRESERVISION AREDS 2) CAPS Take 1 capsule by mouth in the morning and at bedtime.   omeprazole (PRILOSEC) 40 MG capsule TAKE 1 CAPSULE DAILY   valsartan (DIOVAN) 160 MG tablet Take 160 mg by mouth daily.   Vibegron (GEMTESA) 75 MG TABS Take 75 mg by mouth daily.   vitamin B-12 (CYANOCOBALAMIN) 1000 MCG tablet Take 1,000 mcg by mouth in the morning.   No facility-administered encounter medications on file as of 05/17/2023.    Review of Systems  Constitutional:  Positive for activity change and fatigue. Negative for appetite change, chills, diaphoresis, fever and unexpected weight change.  HENT:  Negative for congestion.   Respiratory:  Positive for shortness of breath. Negative for cough and wheezing.   Cardiovascular:  Positive for leg swelling. Negative for chest pain and palpitations.  Gastrointestinal:  Negative for  abdominal distention, abdominal pain, constipation and diarrhea.  Genitourinary:  Negative for difficulty urinating, dysuria, frequency and hematuria.  Musculoskeletal:  Positive for gait problem. Negative for arthralgias, back pain, joint swelling and myalgias.  Neurological:  Positive for weakness. Negative for dizziness, tremors, seizures, syncope, facial asymmetry, speech difficulty, light-headedness, numbness and headaches.  Psychiatric/Behavioral:  Positive for confusion. Negative for agitation and behavioral problems.     Immunization History  Administered Date(s) Administered   Fluad Quad(high Dose 65+) 01/05/2019, 12/07/2021, 01/15/2023   Fluad Trivalent(High Dose 65+) 01/15/2023   H1N1 04/07/2008   Influenza Whole 12/29/2008, 12/22/2009   Influenza, High Dose Seasonal PF 01/15/2012, 12/23/2014, 01/22/2020   Influenza,inj,Quad PF,6+ Mos 01/22/2014, 12/10/2016, 01/17/2018   Influenza,trivalent, recombinat, inj, PF 01/05/2016   Influenza-Unspecified 04/07/2008, 12/15/2014, 01/13/2021   Moderna Covid-19 Vaccine Bivalent Booster 14yrs & up 01/29/2022, 01/15/2023   Moderna Sars-Covid-2 Vaccination 04/07/2019, 05/05/2019, 02/09/2020   PFIZER(Purple Top)SARS-COV-2 Vaccination 01/06/2021   PPD Test 07/17/2010, 04/19/2015, 04/25/2015   Pfizer Covid-19 Vaccine Bivalent Booster 3yrs & up 07/19/2022   Pneumococcal Conjugate-13 11/10/2013   Pneumococcal Polysaccharide-23 12/27/2010, 12/05/2016   RSV,unspecified 03/28/2022   Tdap 04/15/2017   Zoster Recombinant(Shingrix) 04/09/2017, 06/18/2017   Pertinent  Health Maintenance Due  Topic Date Due   INFLUENZA VACCINE  Completed   DEXA SCAN  Completed      05/21/2022    1:06 PM 06/06/2022    4:55 PM 07/09/2022    1:44 PM 08/29/2022    3:31 PM 04/18/2023   12:52 PM  Fall Risk  Falls in the past year? 1 1 0 1 0  Was there an injury with Fall? 1 1 0 1 0  Fall Risk Category Calculator 3 3 0 2 0  Patient at Risk for Falls Due to History of  fall(s);Impaired balance/gait;Impaired mobility History of fall(s);Impaired balance/gait;Impaired mobility History of fall(s);Impaired balance/gait;Impaired mobility    Fall risk Follow up Falls evaluation completed Falls evaluation completed;Education provided;Falls prevention discussed Falls evaluation completed Falls evaluation completed Falls evaluation completed   Functional Status Survey:    Vitals:   05/17/23 1027  BP: (!) 164/88  Pulse: (!) 105  Resp: (!) 26  Temp: (!) 97.4 F (36.3 C)  SpO2: 93%  Weight: 121 lb 6.4 oz (55.1  kg)   Body mass index is 22.2 kg/m. Physical Exam Vitals and nursing note reviewed.  Constitutional:      General: She is not in acute distress.    Appearance: She is not diaphoretic.  HENT:     Head: Normocephalic and atraumatic.     Mouth/Throat:     Mouth: Mucous membranes are moist.     Pharynx: Oropharynx is clear.  Eyes:     Conjunctiva/sclera: Conjunctivae normal.     Pupils: Pupils are equal, round, and reactive to light.  Neck:     Vascular: No JVD.  Cardiovascular:     Rate and Rhythm: Normal rate. Rhythm irregular.     Heart sounds: Murmur heard.  Pulmonary:     Effort: Pulmonary effort is normal. No respiratory distress.     Breath sounds: Rales (very faint right base) present. No wheezing.  Abdominal:     General: Bowel sounds are normal. There is no distension.     Palpations: Abdomen is soft.     Tenderness: There is no abdominal tenderness.  Musculoskeletal:     Comments: RLE +1, LLE trace  Skin:    General: Skin is warm and dry.  Neurological:     General: No focal deficit present.     Mental Status: She is alert.     Comments: Slow to respond, no focal defict     Labs reviewed: Recent Labs    06/22/22 1619 08/06/22 1327 10/18/22 0000 12/31/22 0000 01/07/23 0000 02/05/23 1146  NA 136 137   < > 135* 137 138  K 4.7 4.1   < > 4.0 4.0 4.1  CL 99 106   < > 104 102 104  CO2 22 23   < > 22 23* 24  GLUCOSE 100*  106*  --   --   --  96  BUN 18 19   < > 18 23* 34*  CREATININE 0.72 0.76   < > 0.9 0.9 1.15*  CALCIUM 9.5 9.4   < > 8.8 8.9 9.8   < > = values in this interval not displayed.   Recent Labs    10/18/22 0000 12/31/22 0000 02/05/23 1146  AST 17 26 18   ALT 16 36* 19  ALKPHOS 90 104 91  BILITOT  --   --  0.8  PROT  --   --  7.1  ALBUMIN 4.4 4.1 4.1   Recent Labs    08/06/22 1327 10/11/22 1405 10/18/22 0000 10/30/22 0000 12/31/22 0000 02/05/23 1146 04/04/23 0000  WBC 7.6 9.9 8.1   < > 6.4 7.6 5.8  NEUTROABS  --  5.7 4.90  --  3.70 4.5  --   HGB 11.7* 11.9* 12.2   < > 10.6* 11.5* 11.0*  HCT 35.2* 35.6* 36   < > 31* 34.6* 33*  MCV 100.9* 102.6*  --   --   --  102.4*  --   PLT 170 193 188   < > 157 163 144*   < > = values in this interval not displayed.   Lab Results  Component Value Date   TSH 12.60 (A) 04/04/2023   Lab Results  Component Value Date   HGBA1C 5.5 01/12/2013   Lab Results  Component Value Date   CHOL 108 04/04/2023   HDL 49 04/04/2023   LDLCALC 41 04/04/2023   TRIG 93 04/04/2023    Significant Diagnostic Results in last 30 days:  No results found.   Assessment and Plan  Acute on Chronic Shortness of Breath Increased respiratory rate and pulse. O2 sat 93% on room air. History of AFib, chronic diastolic heart failure, and interstitial lung disease. Recent treatment for influenza A. No current cough or nasal congestion. -Order chest x-ray. -Covid swab -Administer Xopenex nebulizers as needed.  Acute Confusion and Weakness More confused from baseline, slow to respond, needs prompting and cueing. History of dementia and CVA. Recent influenza A infection. -Order CBC, CMP, and UA to evaluate for possible causes including UTI and hyponatremia.  Chronic Medical Conditions AFib (on Eliquis), dementia, CVA, hypertension, interstitial lung disease, rheumatoid arthritis, chronic diastolic heart failure, GI bleed, colon cancer, GERD, hypothyroidism, and  anemia. -Continue current management.

## 2023-05-18 ENCOUNTER — Telehealth: Payer: Self-pay | Admitting: Adult Health

## 2023-05-18 DIAGNOSIS — F01A0 Vascular dementia, mild, without behavioral disturbance, psychotic disturbance, mood disturbance, and anxiety: Secondary | ICD-10-CM | POA: Diagnosis not present

## 2023-05-18 DIAGNOSIS — M62561 Muscle wasting and atrophy, not elsewhere classified, right lower leg: Secondary | ICD-10-CM | POA: Diagnosis not present

## 2023-05-18 DIAGNOSIS — F0150 Vascular dementia without behavioral disturbance: Secondary | ICD-10-CM | POA: Diagnosis not present

## 2023-05-18 DIAGNOSIS — R2689 Other abnormalities of gait and mobility: Secondary | ICD-10-CM | POA: Diagnosis not present

## 2023-05-18 DIAGNOSIS — R41841 Cognitive communication deficit: Secondary | ICD-10-CM | POA: Diagnosis not present

## 2023-05-18 DIAGNOSIS — M6289 Other specified disorders of muscle: Secondary | ICD-10-CM | POA: Diagnosis not present

## 2023-05-18 MED ORDER — CEPHALEXIN 500 MG PO CAPS: 500.0000 mg | ORAL_CAPSULE | Freq: Three times a day (TID) | ORAL | Status: AC

## 2023-05-18 NOTE — Telephone Encounter (Signed)
 CXR returned with no acute abnormality CBC and BMP unrevealing UA with positive nitrite and leukocyte esterase  Pt remains very confused and weak from baseline Will start Keflex for possible UTI and follow cx

## 2023-05-19 DIAGNOSIS — R8290 Unspecified abnormal findings in urine: Secondary | ICD-10-CM | POA: Diagnosis not present

## 2023-05-19 DIAGNOSIS — R410 Disorientation, unspecified: Secondary | ICD-10-CM | POA: Diagnosis not present

## 2023-05-19 DIAGNOSIS — R0602 Shortness of breath: Secondary | ICD-10-CM | POA: Diagnosis not present

## 2023-05-20 ENCOUNTER — Encounter: Payer: Self-pay | Admitting: Internal Medicine

## 2023-05-20 DIAGNOSIS — M6289 Other specified disorders of muscle: Secondary | ICD-10-CM | POA: Diagnosis not present

## 2023-05-20 DIAGNOSIS — R2689 Other abnormalities of gait and mobility: Secondary | ICD-10-CM | POA: Diagnosis not present

## 2023-05-20 DIAGNOSIS — R41841 Cognitive communication deficit: Secondary | ICD-10-CM | POA: Diagnosis not present

## 2023-05-20 DIAGNOSIS — F01A0 Vascular dementia, mild, without behavioral disturbance, psychotic disturbance, mood disturbance, and anxiety: Secondary | ICD-10-CM | POA: Diagnosis not present

## 2023-05-20 DIAGNOSIS — F0150 Vascular dementia without behavioral disturbance: Secondary | ICD-10-CM | POA: Diagnosis not present

## 2023-05-20 DIAGNOSIS — M62561 Muscle wasting and atrophy, not elsewhere classified, right lower leg: Secondary | ICD-10-CM | POA: Diagnosis not present

## 2023-05-20 NOTE — Progress Notes (Unsigned)
 Location:  [facility] Room Number: 129/A Place of Service:  SNF [id number]) Provider:  Mahlon Gammon, MD  Patient Care Team: Mahlon Gammon, MD as PCP - General (Internal Medicine) Croitoru, Rachelle Hora, MD as PCP - Cardiology (Cardiology) Drema Dallas, DO as Consulting Physician (Neurology)  Extended Emergency Contact Information Primary Emergency Contact: [name],[name] Address: [address]          [place], [place] [address] Darden Amber of [place] Home Phone: [phone] Mobile Phone: [phone] Relation: Daughter Secondary Emergency Contact: Hollie Salk Address: [address]          [place], [place] [address] Darden Amber of [place] Mobile Phone: [phone] Relation: Son  Code Status:  DNR Goals of care: Advanced Directive information    05/20/2023   12:11 PM  Advanced Directives  Does Patient Have a Medical Advance Directive? Yes  Type of Advance Directive Healthcare Power of Attorney  Does patient want to make changes to medical advance directive? No - Patient declined  Copy of Healthcare Power of Attorney in Chart? No - copy requested     Chief Complaint  Patient presents with   Medical Management of Chronic Issues    Routine Visit    HPI:  Pt is a 89 y.o. female seen today for medical management of chronic diseases.     Past Medical History:  Diagnosis Date   Allergy    seasonal   Anemia    Anxiety    Arthritis    Back   Asymptomatic menopausal state    Atrial fibrillation (HCC)    Cancer (HCC) 2006   Colon.  Basal Cell Skin cancer- right arm   Cataract    removed bilateral   Chronic atrial fibrillation (HCC)    Colon cancer (HCC)    Constipation due to pain medication therapy    after heart surgery   Deficiency of other specified B group vitamins    Diplopia    Disorientation, unspecified    Dysrhythmia    PAF   GERD (gastroesophageal reflux disease)    Heart murmur    Hyperlipidemia    Hypertension     Hypothyroidism    Malignant neoplasm of colon, unspecified (HCC)    Other specified diseases of blood and blood-forming organs    Other specified disorders of bone density and structure, unspecified forearm    Other thrombophilia (HCC)    per matrix   Overactive bladder    per Matrix   Personal history of other malignant neoplasm of large intestine    Presbycusis, bilateral    per matrix   RA (rheumatoid arthritis) (HCC)    Restless leg    Seizures (HCC)    after Heart Surgey   Stroke (HCC)    TIA- found by neurologist after    Valgus deformity, not elsewhere classified, right knee    per matrix   Vascular dementia, mild, without behavioral disturbance, psychotic disturbance, mood disturbance, and anxiety (HCC)    per matrix   Past Surgical History:  Procedure Laterality Date   ABDOMINAL HYSTERECTOMY  1970   Partial    COLON RESECTION  2006   cancer   COLON SURGERY     COLONOSCOPY     EYE SURGERY Bilateral    Cataract   MAXIMUM ACCESS (MAS)POSTERIOR LUMBAR INTERBODY FUSION (PLIF) 2 LEVEL N/A 04/12/2015   Procedure: Lumbar Three-Five Decompression, Pedicle Screw Fixation, and Posteriolateral Arthrodesis;  Surgeon: Maeola Harman, MD;  Location: [facility];  Service: Neurosurgery;  Laterality: N/A;  L3-4 L4-5 Maximum access posterior lumbar fusion, possible interbodies and resection of synovial cyst at L4-5   MITRAL VALVE REPAIR  01/20/2013   Gore-tex cords to P1, P2, and P3. Magic suture to posterior medial commisure, #30 Physio 1 ring. Done in [place]   TONSILLECTOMY     about 1940   TRICUSPID VALVE SURGERY  01/20/2013   #28 TriAd ring done in [place]    Allergies  Allergen Reactions   Claritin [Loratadine] Other (See Comments)    Listed on MAR Unknown reaction   Codeine Other (See Comments)    "just don't take it well"   Gabapentin Other (See Comments)    Dizziness    Molds & Smuts Other (See Comments)    Dust.  Reaction is not listed on MAR    Pollen Extract  Swelling    Grass   Bee Venom Swelling and Rash   Wasp Venom Swelling and Rash    Outpatient Encounter Medications as of 05/20/2023  Medication Sig   acetaminophen (TYLENOL) 500 MG tablet Take 1,000 mg by mouth in the morning and at bedtime.   amoxicillin (AMOXIL) 500 MG capsule Take 1,000 mg by mouth 2 (two) times daily.   apixaban (ELIQUIS) 2.5 MG TABS tablet Take 1 tablet (2.5 mg total) by mouth 2 (two) times daily.   atorvastatin (LIPITOR) 20 MG tablet TAKE 1 TABLET DAILY   cefdinir (OMNICEF) 300 MG capsule Take 300 mg by mouth 2 (two) times daily. re: UTI (give 300mg  BID x 7days) Twice A Day; 08:00 AM - 11:00 AM, 04:00 PM - 07:00 PM   cephALEXin (KEFLEX) 500 MG capsule Take 1 capsule (500 mg total) by mouth 3 (three) times daily for 7 days.   Cholecalciferol (VITAMIN D) 50 MCG (2000 UT) CAPS Take 2,000 Units by mouth in the morning.   cloNIDine (CATAPRES) 0.1 MG tablet Take 1 tablet (0.1 mg total) by mouth daily as needed. If SBP >180 only   furosemide (LASIX) 20 MG tablet Take 20 mg daily until weight is 124 pounds. Then take 20 mg daily ONLY AS NEEDED for weight of 125 pounds or greater.   guaiFENesin-dextromethorphan (ROBITUSSIN DM) 100-10 MG/5ML syrup Take 20 mLs by mouth every 6 (six) hours as needed for cough.   Iron, Ferrous Sulfate, 325 (65 Fe) MG TABS Take 325 mg by mouth daily.   leflunomide (ARAVA) 20 MG tablet Take 20 mg by mouth in the morning.   levalbuterol (XOPENEX) 0.63 MG/3ML nebulizer solution Take 3 mLs (0.63 mg total) by nebulization every 6 (six) hours as needed for wheezing or shortness of breath.   levothyroxine (SYNTHROID) 75 MCG tablet Take 1 tablet (75 mcg total) by mouth daily.   metoprolol succinate (TOPROL-XL) 100 MG 24 hr tablet Take 1 tablet (100 mg total) by mouth daily. Take 150mg  with or immediately following a meal.   mirtazapine (REMERON) 15 MG tablet TAKE 1 TABLET AT BEDTIME   Multiple Vitamin (MULTIVITAMIN ADULT PO) Take 1 tablet by mouth every  morning.   Multiple Vitamins-Minerals (PRESERVISION AREDS 2) CAPS Take 1 capsule by mouth in the morning and at bedtime.   omeprazole (PRILOSEC) 40 MG capsule TAKE 1 CAPSULE DAILY   valsartan (DIOVAN) 160 MG tablet Take 160 mg by mouth daily.   Vibegron (GEMTESA) 75 MG TABS Take 75 mg by mouth daily.   vitamin B-12 (CYANOCOBALAMIN) 1000 MCG tablet Take 1,000 mcg by mouth in the morning.   No facility-administered encounter medications on file as  of 05/20/2023.    Review of Systems  Immunization History  Administered Date(s) Administered   Fluad Quad(high Dose 65+) 01/05/2019, 12/07/2021, 01/15/2023   Fluad Trivalent(High Dose 65+) 01/15/2023   H1N1 04/07/2008   Influenza Whole 12/29/2008, 12/22/2009   Influenza, High Dose Seasonal PF 01/15/2012, 12/23/2014, 01/22/2020   Influenza,inj,Quad PF,6+ Mos 01/22/2014, 12/10/2016, 01/17/2018   Influenza,trivalent, recombinat, inj, PF 01/05/2016   Influenza-Unspecified 04/07/2008, 12/15/2014, 01/13/2021   Moderna Covid-19 Vaccine Bivalent Booster 51yrs & up 01/29/2022, 01/15/2023   Moderna Sars-Covid-2 Vaccination 04/07/2019, 05/05/2019, 02/09/2020   PFIZER(Purple Top)SARS-COV-2 Vaccination 01/06/2021   PPD Test 07/17/2010, 04/19/2015, 04/25/2015   Pfizer Covid-19 Vaccine Bivalent Booster 54yrs & up 07/19/2022   Pneumococcal Conjugate-13 11/10/2013   Pneumococcal Polysaccharide-23 12/27/2010, 12/05/2016   RSV,unspecified 03/28/2022   Tdap 04/15/2017   Zoster Recombinant(Shingrix) 04/09/2017, 06/18/2017   Pertinent  Health Maintenance Due  Topic Date Due   INFLUENZA VACCINE  Completed   DEXA SCAN  Completed      05/21/2022    1:06 PM 06/06/2022    4:55 PM 07/09/2022    1:44 PM 08/29/2022    3:31 PM 04/18/2023   12:52 PM  Fall Risk  Falls in the past year? 1 1 0 1 0  Was there an injury with Fall? 1 1 0 1 0  Fall Risk Category Calculator 3 3 0 2 0  Patient at Risk for Falls Due to History of fall(s);Impaired balance/gait;Impaired  mobility History of fall(s);Impaired balance/gait;Impaired mobility History of fall(s);Impaired balance/gait;Impaired mobility    Fall risk Follow up Falls evaluation completed Falls evaluation completed;Education provided;Falls prevention discussed Falls evaluation completed Falls evaluation completed Falls evaluation completed   Functional Status Survey:    Vitals:   05/20/23 1149  BP: (!) 166/84  Pulse: 75  Resp: 19  Temp: (!) 96.8 F (36 C)  SpO2: 99%  Weight: 122 lb 1.6 oz (55.4 kg)  Height: 5\' 2"  (1.575 m)   Body mass index is 22.33 kg/m. Physical Exam  Labs reviewed: Recent Labs    06/22/22 1619 08/06/22 1327 10/18/22 0000 12/31/22 0000 01/07/23 0000 02/05/23 1146  NA 136 137   < > 135* 137 138  K 4.7 4.1   < > 4.0 4.0 4.1  CL 99 106   < > 104 102 104  CO2 22 23   < > 22 23* 24  GLUCOSE 100* 106*  --   --   --  96  BUN 18 19   < > 18 23* 34*  CREATININE 0.72 0.76   < > 0.9 0.9 1.15*  CALCIUM 9.5 9.4   < > 8.8 8.9 9.8   < > = values in this interval not displayed.   Recent Labs    10/18/22 0000 12/31/22 0000 02/05/23 1146  AST 17 26 18   ALT 16 36* 19  ALKPHOS 90 104 91  BILITOT  --   --  0.8  PROT  --   --  7.1  ALBUMIN 4.4 4.1 4.1   Recent Labs    08/06/22 1327 10/11/22 1405 10/18/22 0000 10/30/22 0000 12/31/22 0000 02/05/23 1146 04/04/23 0000  WBC 7.6 9.9 8.1   < > 6.4 7.6 5.8  NEUTROABS  --  5.7 4.90  --  3.70 4.5  --   HGB 11.7* 11.9* 12.2   < > 10.6* 11.5* 11.0*  HCT 35.2* 35.6* 36   < > 31* 34.6* 33*  MCV 100.9* 102.6*  --   --   --  102.4*  --  PLT 170 193 188   < > 157 163 144*   < > = values in this interval not displayed.   Lab Results  Component Value Date   TSH 12.60 (A) 04/04/2023   Lab Results  Component Value Date   HGBA1C 5.5 01/12/2013   Lab Results  Component Value Date   CHOL 108 04/04/2023   HDL 49 04/04/2023   LDLCALC 41 04/04/2023   TRIG 93 04/04/2023    Significant Diagnostic Results in last 30 days:   No results found.  Assessment/Plan There are no diagnoses linked to this encounter.   Family/ staff Communication: ***  Labs/tests ordered:  ***

## 2023-05-21 DIAGNOSIS — M62561 Muscle wasting and atrophy, not elsewhere classified, right lower leg: Secondary | ICD-10-CM | POA: Diagnosis not present

## 2023-05-21 DIAGNOSIS — F01A0 Vascular dementia, mild, without behavioral disturbance, psychotic disturbance, mood disturbance, and anxiety: Secondary | ICD-10-CM | POA: Diagnosis not present

## 2023-05-21 DIAGNOSIS — R41841 Cognitive communication deficit: Secondary | ICD-10-CM | POA: Diagnosis not present

## 2023-05-21 DIAGNOSIS — R2689 Other abnormalities of gait and mobility: Secondary | ICD-10-CM | POA: Diagnosis not present

## 2023-05-21 DIAGNOSIS — M6289 Other specified disorders of muscle: Secondary | ICD-10-CM | POA: Diagnosis not present

## 2023-05-21 DIAGNOSIS — F0150 Vascular dementia without behavioral disturbance: Secondary | ICD-10-CM | POA: Diagnosis not present

## 2023-05-22 DIAGNOSIS — M6289 Other specified disorders of muscle: Secondary | ICD-10-CM | POA: Diagnosis not present

## 2023-05-22 DIAGNOSIS — F01A0 Vascular dementia, mild, without behavioral disturbance, psychotic disturbance, mood disturbance, and anxiety: Secondary | ICD-10-CM | POA: Diagnosis not present

## 2023-05-22 DIAGNOSIS — R41841 Cognitive communication deficit: Secondary | ICD-10-CM | POA: Diagnosis not present

## 2023-05-22 DIAGNOSIS — R2689 Other abnormalities of gait and mobility: Secondary | ICD-10-CM | POA: Diagnosis not present

## 2023-05-22 DIAGNOSIS — M62561 Muscle wasting and atrophy, not elsewhere classified, right lower leg: Secondary | ICD-10-CM | POA: Diagnosis not present

## 2023-05-22 DIAGNOSIS — F0150 Vascular dementia without behavioral disturbance: Secondary | ICD-10-CM | POA: Diagnosis not present

## 2023-05-23 DIAGNOSIS — R41841 Cognitive communication deficit: Secondary | ICD-10-CM | POA: Diagnosis not present

## 2023-05-23 DIAGNOSIS — F01A0 Vascular dementia, mild, without behavioral disturbance, psychotic disturbance, mood disturbance, and anxiety: Secondary | ICD-10-CM | POA: Diagnosis not present

## 2023-05-23 DIAGNOSIS — R2689 Other abnormalities of gait and mobility: Secondary | ICD-10-CM | POA: Diagnosis not present

## 2023-05-23 DIAGNOSIS — M6289 Other specified disorders of muscle: Secondary | ICD-10-CM | POA: Diagnosis not present

## 2023-05-23 DIAGNOSIS — F0150 Vascular dementia without behavioral disturbance: Secondary | ICD-10-CM | POA: Diagnosis not present

## 2023-05-23 DIAGNOSIS — M62561 Muscle wasting and atrophy, not elsewhere classified, right lower leg: Secondary | ICD-10-CM | POA: Diagnosis not present

## 2023-05-24 DIAGNOSIS — R41841 Cognitive communication deficit: Secondary | ICD-10-CM | POA: Diagnosis not present

## 2023-05-24 DIAGNOSIS — M62561 Muscle wasting and atrophy, not elsewhere classified, right lower leg: Secondary | ICD-10-CM | POA: Diagnosis not present

## 2023-05-24 DIAGNOSIS — F0150 Vascular dementia without behavioral disturbance: Secondary | ICD-10-CM | POA: Diagnosis not present

## 2023-05-24 DIAGNOSIS — F01A0 Vascular dementia, mild, without behavioral disturbance, psychotic disturbance, mood disturbance, and anxiety: Secondary | ICD-10-CM | POA: Diagnosis not present

## 2023-05-24 DIAGNOSIS — M6289 Other specified disorders of muscle: Secondary | ICD-10-CM | POA: Diagnosis not present

## 2023-05-24 DIAGNOSIS — R2689 Other abnormalities of gait and mobility: Secondary | ICD-10-CM | POA: Diagnosis not present

## 2023-05-27 ENCOUNTER — Non-Acute Institutional Stay (SKILLED_NURSING_FACILITY): Payer: Self-pay | Admitting: Internal Medicine

## 2023-05-27 DIAGNOSIS — F01A0 Vascular dementia, mild, without behavioral disturbance, psychotic disturbance, mood disturbance, and anxiety: Secondary | ICD-10-CM | POA: Diagnosis not present

## 2023-05-27 DIAGNOSIS — M06049 Rheumatoid arthritis without rheumatoid factor, unspecified hand: Secondary | ICD-10-CM

## 2023-05-27 DIAGNOSIS — M6289 Other specified disorders of muscle: Secondary | ICD-10-CM | POA: Diagnosis not present

## 2023-05-27 DIAGNOSIS — R2689 Other abnormalities of gait and mobility: Secondary | ICD-10-CM | POA: Diagnosis not present

## 2023-05-27 DIAGNOSIS — I5032 Chronic diastolic (congestive) heart failure: Secondary | ICD-10-CM

## 2023-05-27 DIAGNOSIS — E782 Mixed hyperlipidemia: Secondary | ICD-10-CM | POA: Diagnosis not present

## 2023-05-27 DIAGNOSIS — J101 Influenza due to other identified influenza virus with other respiratory manifestations: Secondary | ICD-10-CM | POA: Diagnosis not present

## 2023-05-27 DIAGNOSIS — E039 Hypothyroidism, unspecified: Secondary | ICD-10-CM

## 2023-05-27 DIAGNOSIS — I4821 Permanent atrial fibrillation: Secondary | ICD-10-CM | POA: Diagnosis not present

## 2023-05-27 DIAGNOSIS — F0150 Vascular dementia without behavioral disturbance: Secondary | ICD-10-CM | POA: Diagnosis not present

## 2023-05-27 DIAGNOSIS — D509 Iron deficiency anemia, unspecified: Secondary | ICD-10-CM

## 2023-05-27 DIAGNOSIS — R41841 Cognitive communication deficit: Secondary | ICD-10-CM | POA: Diagnosis not present

## 2023-05-27 DIAGNOSIS — M62562 Muscle wasting and atrophy, not elsewhere classified, left lower leg: Secondary | ICD-10-CM | POA: Diagnosis not present

## 2023-05-27 DIAGNOSIS — M62561 Muscle wasting and atrophy, not elsewhere classified, right lower leg: Secondary | ICD-10-CM | POA: Diagnosis not present

## 2023-05-27 DIAGNOSIS — I10 Essential (primary) hypertension: Secondary | ICD-10-CM | POA: Diagnosis not present

## 2023-05-28 DIAGNOSIS — R41841 Cognitive communication deficit: Secondary | ICD-10-CM | POA: Diagnosis not present

## 2023-05-28 DIAGNOSIS — M6289 Other specified disorders of muscle: Secondary | ICD-10-CM | POA: Diagnosis not present

## 2023-05-28 DIAGNOSIS — M62561 Muscle wasting and atrophy, not elsewhere classified, right lower leg: Secondary | ICD-10-CM | POA: Diagnosis not present

## 2023-05-28 DIAGNOSIS — F01A0 Vascular dementia, mild, without behavioral disturbance, psychotic disturbance, mood disturbance, and anxiety: Secondary | ICD-10-CM | POA: Diagnosis not present

## 2023-05-28 DIAGNOSIS — R2689 Other abnormalities of gait and mobility: Secondary | ICD-10-CM | POA: Diagnosis not present

## 2023-05-28 DIAGNOSIS — F0150 Vascular dementia without behavioral disturbance: Secondary | ICD-10-CM | POA: Diagnosis not present

## 2023-05-29 DIAGNOSIS — F0150 Vascular dementia without behavioral disturbance: Secondary | ICD-10-CM | POA: Diagnosis not present

## 2023-05-29 DIAGNOSIS — M6289 Other specified disorders of muscle: Secondary | ICD-10-CM | POA: Diagnosis not present

## 2023-05-29 DIAGNOSIS — F01A0 Vascular dementia, mild, without behavioral disturbance, psychotic disturbance, mood disturbance, and anxiety: Secondary | ICD-10-CM | POA: Diagnosis not present

## 2023-05-29 DIAGNOSIS — R41841 Cognitive communication deficit: Secondary | ICD-10-CM | POA: Diagnosis not present

## 2023-05-29 DIAGNOSIS — R2689 Other abnormalities of gait and mobility: Secondary | ICD-10-CM | POA: Diagnosis not present

## 2023-05-29 DIAGNOSIS — M62561 Muscle wasting and atrophy, not elsewhere classified, right lower leg: Secondary | ICD-10-CM | POA: Diagnosis not present

## 2023-05-30 DIAGNOSIS — R2689 Other abnormalities of gait and mobility: Secondary | ICD-10-CM | POA: Diagnosis not present

## 2023-05-30 DIAGNOSIS — M6289 Other specified disorders of muscle: Secondary | ICD-10-CM | POA: Diagnosis not present

## 2023-05-30 DIAGNOSIS — F01A0 Vascular dementia, mild, without behavioral disturbance, psychotic disturbance, mood disturbance, and anxiety: Secondary | ICD-10-CM | POA: Diagnosis not present

## 2023-05-30 DIAGNOSIS — F0150 Vascular dementia without behavioral disturbance: Secondary | ICD-10-CM | POA: Diagnosis not present

## 2023-05-30 DIAGNOSIS — R41841 Cognitive communication deficit: Secondary | ICD-10-CM | POA: Diagnosis not present

## 2023-05-30 DIAGNOSIS — M62561 Muscle wasting and atrophy, not elsewhere classified, right lower leg: Secondary | ICD-10-CM | POA: Diagnosis not present

## 2023-05-31 ENCOUNTER — Encounter: Payer: Self-pay | Admitting: Internal Medicine

## 2023-05-31 DIAGNOSIS — M62561 Muscle wasting and atrophy, not elsewhere classified, right lower leg: Secondary | ICD-10-CM | POA: Diagnosis not present

## 2023-05-31 DIAGNOSIS — R2689 Other abnormalities of gait and mobility: Secondary | ICD-10-CM | POA: Diagnosis not present

## 2023-05-31 DIAGNOSIS — F0150 Vascular dementia without behavioral disturbance: Secondary | ICD-10-CM | POA: Diagnosis not present

## 2023-05-31 DIAGNOSIS — F01A0 Vascular dementia, mild, without behavioral disturbance, psychotic disturbance, mood disturbance, and anxiety: Secondary | ICD-10-CM | POA: Diagnosis not present

## 2023-05-31 DIAGNOSIS — M6289 Other specified disorders of muscle: Secondary | ICD-10-CM | POA: Diagnosis not present

## 2023-05-31 DIAGNOSIS — R41841 Cognitive communication deficit: Secondary | ICD-10-CM | POA: Diagnosis not present

## 2023-05-31 NOTE — Progress Notes (Signed)
 Location:  Medical illustrator of Service:  SNF (31)  Provider:   Code Status: DNR Goals of Care:     05/20/2023   12:11 PM  Advanced Directives  Does Patient Have a Medical Advance Directive? Yes  Type of Advance Directive Healthcare Power of Attorney  Does patient want to make changes to medical advance directive? No - Patient declined  Copy of Healthcare Power of Attorney in Chart? No - copy requested     Chief Complaint  Patient presents with   Care Management    HPI: Patient is a 89 y.o. female seen today for medical management of chronic diseases.   Lives in SNF   Has A Fib on Eliquis  Hypertension and HLD H/o TIA  Cerebral Amyloid angiopathy on MRI Colon Cancer treated with Immunotherapy No Surgery in Remission  Anemia  Cognitive impairment MMSE 15/30 Rheumatoid arthritis on Arava   Recent Acute issues Influenza A infection Treated with Tamiflu Followed by Confusion Urine culture came back positive for citrobacter Treated with Cefdinir for 7 days  She is better  Still weak and needing more assistance Per nurses have noticed more confusion Walks with her walker Did not have c/o sob or cough Appetite better Weight is staying stable at 124 lbs    Past Medical History:  Diagnosis Date   Allergy    seasonal   Anemia    Anxiety    Arthritis    Back   Asymptomatic menopausal state    Atrial fibrillation (HCC)    Cancer (HCC) 2006   Colon.  Basal Cell Skin cancer- right arm   Cataract    removed bilateral   Chronic atrial fibrillation (HCC)    Colon cancer (HCC)    Constipation due to pain medication therapy    after heart surgery   Deficiency of other specified B group vitamins    Diplopia    Disorientation, unspecified    Dysrhythmia    PAF   GERD (gastroesophageal reflux disease)    Heart murmur    Hyperlipidemia    Hypertension    Hypothyroidism    Malignant neoplasm of colon, unspecified (HCC)    Other  specified diseases of blood and blood-forming organs    Other specified disorders of bone density and structure, unspecified forearm    Other thrombophilia (HCC)    per matrix   Overactive bladder    per Matrix   Personal history of other malignant neoplasm of large intestine    Presbycusis, bilateral    per matrix   RA (rheumatoid arthritis) (HCC)    Restless leg    Seizures (HCC)    after Heart Surgey   Stroke (HCC)    TIA- found by neurologist after    Valgus deformity, not elsewhere classified, right knee    per matrix   Vascular dementia, mild, without behavioral disturbance, psychotic disturbance, mood disturbance, and anxiety (HCC)    per matrix    Past Surgical History:  Procedure Laterality Date   ABDOMINAL HYSTERECTOMY  1970   Partial    COLON RESECTION  2006   cancer   COLON SURGERY     COLONOSCOPY     EYE SURGERY Bilateral    Cataract   MAXIMUM ACCESS (MAS)POSTERIOR LUMBAR INTERBODY FUSION (PLIF) 2 LEVEL N/A 04/12/2015   Procedure: Lumbar Three-Five Decompression, Pedicle Screw Fixation, and Posteriolateral Arthrodesis;  Surgeon: Maeola Harman, MD;  Location: [facility];  Service: Neurosurgery;  Laterality: N/A;  L3-4  L4-5 Maximum access posterior lumbar fusion, possible interbodies and resection of synovial cyst at L4-5   MITRAL VALVE REPAIR  01/20/2013   Gore-tex cords to P1, P2, and P3. Magic suture to posterior medial commisure, #30 Physio 1 ring. Done in [place]   TONSILLECTOMY     about 1940   TRICUSPID VALVE SURGERY  01/20/2013   #28 TriAd ring done in [place]    Allergies  Allergen Reactions   Claritin [Loratadine] Other (See Comments)    Listed on MAR Unknown reaction   Codeine Other (See Comments)    "just don't take it well"   Gabapentin Other (See Comments)    Dizziness    Molds & Smuts Other (See Comments)    Dust.  Reaction is not listed on MAR    Pollen Extract Swelling    Grass   Bee Venom Swelling and Rash   Wasp Venom Swelling and  Rash    Outpatient Encounter Medications as of 05/27/2023  Medication Sig   acetaminophen (TYLENOL) 500 MG tablet Take 1,000 mg by mouth in the morning and at bedtime.   amoxicillin (AMOXIL) 500 MG capsule Take 1,000 mg by mouth 2 (two) times daily.   apixaban (ELIQUIS) 2.5 MG TABS tablet Take 1 tablet (2.5 mg total) by mouth 2 (two) times daily.   atorvastatin (LIPITOR) 20 MG tablet TAKE 1 TABLET DAILY   Cholecalciferol (VITAMIN D) 50 MCG (2000 UT) CAPS Take 2,000 Units by mouth in the morning.   cloNIDine (CATAPRES) 0.1 MG tablet Take 1 tablet (0.1 mg total) by mouth daily as needed. If SBP >180 only   furosemide (LASIX) 20 MG tablet Take 20 mg daily until weight is 124 pounds. Then take 20 mg daily ONLY AS NEEDED for weight of 125 pounds or greater.   guaiFENesin-dextromethorphan (ROBITUSSIN DM) 100-10 MG/5ML syrup Take 20 mLs by mouth every 6 (six) hours as needed for cough.   Iron, Ferrous Sulfate, 325 (65 Fe) MG TABS Take 325 mg by mouth daily.   leflunomide (ARAVA) 20 MG tablet Take 20 mg by mouth in the morning.   levalbuterol (XOPENEX) 0.63 MG/3ML nebulizer solution Take 3 mLs (0.63 mg total) by nebulization every 6 (six) hours as needed for wheezing or shortness of breath.   levothyroxine (SYNTHROID) 75 MCG tablet Take 1 tablet (75 mcg total) by mouth daily.   metoprolol succinate (TOPROL-XL) 100 MG 24 hr tablet Take 1 tablet (100 mg total) by mouth daily. Take 150mg  with or immediately following a meal.   mirtazapine (REMERON) 15 MG tablet TAKE 1 TABLET AT BEDTIME   Multiple Vitamin (MULTIVITAMIN ADULT PO) Take 1 tablet by mouth every morning.   Multiple Vitamins-Minerals (PRESERVISION AREDS 2) CAPS Take 1 capsule by mouth in the morning and at bedtime.   omeprazole (PRILOSEC) 40 MG capsule TAKE 1 CAPSULE DAILY   valsartan (DIOVAN) 160 MG tablet Take 160 mg by mouth daily.   Vibegron (GEMTESA) 75 MG TABS Take 75 mg by mouth daily.   vitamin B-12 (CYANOCOBALAMIN) 1000 MCG tablet Take  1,000 mcg by mouth in the morning.   No facility-administered encounter medications on file as of 05/27/2023.    Review of Systems:  Review of Systems  Constitutional:  Positive for activity change. Negative for appetite change.  HENT: Negative.    Respiratory:  Negative for cough and shortness of breath.   Cardiovascular:  Negative for leg swelling.  Gastrointestinal:  Negative for constipation.  Genitourinary: Negative.   Musculoskeletal:  Positive for gait  problem. Negative for arthralgias and myalgias.  Skin: Negative.   Neurological:  Positive for weakness. Negative for dizziness.  Psychiatric/Behavioral:  Positive for confusion. Negative for dysphoric mood and sleep disturbance.     Health Maintenance  Topic Date Due   COVID-19 Vaccine (8 - 2024-25 season) 03/26/2024 (Originally 03/12/2023)   DTaP/Tdap/Td (2 - Td or Tdap) 04/16/2027   Pneumonia Vaccine 35+ Years old  Completed   INFLUENZA VACCINE  Completed   DEXA SCAN  Completed   Zoster Vaccines- Shingrix  Completed   HPV VACCINES  Aged Out    Physical Exam: Vitals:   05/31/23 1026  BP: 134/88  Pulse: 82  Temp: (!) 97.2 F (36.2 C)  Weight: 124 lb 8 oz (56.5 kg)   Body mass index is 22.77 kg/m. Physical Exam Vitals reviewed.  Constitutional:      Appearance: Normal appearance.  HENT:     Head: Normocephalic.     Nose: Nose normal.     Mouth/Throat:     Mouth: Mucous membranes are moist.     Pharynx: Oropharynx is clear.  Eyes:     Pupils: Pupils are equal, round, and reactive to light.  Cardiovascular:     Rate and Rhythm: Normal rate and regular rhythm.     Pulses: Normal pulses.     Heart sounds: Normal heart sounds. No murmur heard. Pulmonary:     Effort: Pulmonary effort is normal.     Breath sounds: Normal breath sounds.  Abdominal:     General: Abdomen is flat. Bowel sounds are normal.     Palpations: Abdomen is soft.  Musculoskeletal:        General: No swelling.     Cervical back: Neck  supple.  Skin:    General: Skin is warm.  Neurological:     General: No focal deficit present.     Mental Status: She is alert and oriented to person, place, and time.  Psychiatric:        Mood and Affect: Mood normal.        Thought Content: Thought content normal.     Labs reviewed: Basic Metabolic Panel: Recent Labs    06/22/22 1619 08/06/22 1327 10/11/22 1405 12/31/22 0000 01/07/23 0000 02/05/23 1146 04/04/23 0000  NA 136 137   < > 135* 137 138  --   K 4.7 4.1   < > 4.0 4.0 4.1  --   CL 99 106   < > 104 102 104  --   CO2 22 23   < > 22 23* 24  --   GLUCOSE 100* 106*  --   --   --  96  --   BUN 18 19   < > 18 23* 34*  --   CREATININE 0.72 0.76   < > 0.9 0.9 1.15*  --   CALCIUM 9.5 9.4   < > 8.8 8.9 9.8  --   TSH  --   --    < >  --  0.28* 0.043* 12.60*   < > = values in this interval not displayed.   Liver Function Tests: Recent Labs    10/18/22 0000 12/31/22 0000 02/05/23 1146  AST 17 26 18   ALT 16 36* 19  ALKPHOS 90 104 91  BILITOT  --   --  0.8  PROT  --   --  7.1  ALBUMIN 4.4 4.1 4.1   No results for input(s): "LIPASE", "AMYLASE" in the last 8760 hours. No results  for input(s): "AMMONIA" in the last 8760 hours. CBC: Recent Labs    08/06/22 1327 10/11/22 1405 10/18/22 0000 10/30/22 0000 12/31/22 0000 02/05/23 1146 04/04/23 0000  WBC 7.6 9.9 8.1   < > 6.4 7.6 5.8  NEUTROABS  --  5.7 4.90  --  3.70 4.5  --   HGB 11.7* 11.9* 12.2   < > 10.6* 11.5* 11.0*  HCT 35.2* 35.6* 36   < > 31* 34.6* 33*  MCV 100.9* 102.6*  --   --   --  102.4*  --   PLT 170 193 188   < > 157 163 144*   < > = values in this interval not displayed.   Lipid Panel: Recent Labs    04/04/23 0000  CHOL 108  HDL 49  LDLCALC 41  TRIG 93   Lab Results  Component Value Date   HGBA1C 5.5 01/12/2013    Procedures since last visit: No results found.  Assessment/Plan 1. Chronic diastolic heart failure (HCC) (Primary) Lasix used Prn to maintain weight around 124 lbs  2.  Acquired hypothyroidism TSH done again in 02/25 was 12 Dose not changed due to her sickness Repeat TSH pending  3. Mixed hyperlipidemia On statin LDL 41 in 01/25  4. Essential hypertension Controlled on Valsartan and Metoprolol  5. Permanent atrial fibrillation (HCC) Metoprolol and Eliquis  6. Iron deficiency anemia, unspecified iron deficiency anemia type On iron  7. Vascular dementia without behavioral disturbance (HCC) Seen by Neuro MMSE 26/30 in 01/25 Has not tolerated Aricept  8. Rheumatoid arthritis involving hand with negative rheumatoid factor, unspecified laterality (HCC) On Arava 9 Weight and Depression maintained on Remeron 10 Urinary Incontinence On Gemtesa  Labs/tests ordered:  * No order type specified * Next appt:  Visit date not found

## 2023-06-03 DIAGNOSIS — R41841 Cognitive communication deficit: Secondary | ICD-10-CM | POA: Diagnosis not present

## 2023-06-03 DIAGNOSIS — F01A0 Vascular dementia, mild, without behavioral disturbance, psychotic disturbance, mood disturbance, and anxiety: Secondary | ICD-10-CM | POA: Diagnosis not present

## 2023-06-03 DIAGNOSIS — R2689 Other abnormalities of gait and mobility: Secondary | ICD-10-CM | POA: Diagnosis not present

## 2023-06-03 DIAGNOSIS — M6289 Other specified disorders of muscle: Secondary | ICD-10-CM | POA: Diagnosis not present

## 2023-06-03 DIAGNOSIS — M62561 Muscle wasting and atrophy, not elsewhere classified, right lower leg: Secondary | ICD-10-CM | POA: Diagnosis not present

## 2023-06-03 DIAGNOSIS — F0150 Vascular dementia without behavioral disturbance: Secondary | ICD-10-CM | POA: Diagnosis not present

## 2023-06-04 ENCOUNTER — Non-Acute Institutional Stay (SKILLED_NURSING_FACILITY): Payer: Self-pay | Admitting: Orthopedic Surgery

## 2023-06-04 ENCOUNTER — Encounter: Payer: Self-pay | Admitting: Orthopedic Surgery

## 2023-06-04 DIAGNOSIS — R2689 Other abnormalities of gait and mobility: Secondary | ICD-10-CM | POA: Diagnosis not present

## 2023-06-04 DIAGNOSIS — F01A0 Vascular dementia, mild, without behavioral disturbance, psychotic disturbance, mood disturbance, and anxiety: Secondary | ICD-10-CM | POA: Diagnosis not present

## 2023-06-04 DIAGNOSIS — M6289 Other specified disorders of muscle: Secondary | ICD-10-CM | POA: Diagnosis not present

## 2023-06-04 DIAGNOSIS — L03031 Cellulitis of right toe: Secondary | ICD-10-CM

## 2023-06-04 DIAGNOSIS — M62561 Muscle wasting and atrophy, not elsewhere classified, right lower leg: Secondary | ICD-10-CM | POA: Diagnosis not present

## 2023-06-04 DIAGNOSIS — F0150 Vascular dementia without behavioral disturbance: Secondary | ICD-10-CM | POA: Diagnosis not present

## 2023-06-04 DIAGNOSIS — R41841 Cognitive communication deficit: Secondary | ICD-10-CM | POA: Diagnosis not present

## 2023-06-04 NOTE — Progress Notes (Unsigned)
 Location:  [facility] Room Number: 129/A Place of Service:  SNF [phone]) Provider:  Octavia Heir, NP   Mahlon Gammon, MD  Patient Care Team: Mahlon Gammon, MD as PCP - General (Internal Medicine) Croitoru, Rachelle Hora, MD as PCP - Cardiology (Cardiology) Drema Dallas, DO as Consulting Physician (Neurology)  Extended Emergency Contact Information Primary Emergency Contact: [name],[name] Address: [address]          [place], [place] [address] Darden Amber of [place] Home Phone: [phone] Mobile Phone: [phone] Relation: Daughter Secondary Emergency Contact: Hollie Salk Address: [address]          [place], [place] [address] Darden Amber of [place] Mobile Phone: [phone] Relation: Son  Code Status:  DNR Goals of care: Advanced Directive information    05/20/2023   12:11 PM  Advanced Directives  Does Patient Have a Medical Advance Directive? Yes  Type of Advance Directive Healthcare Power of Attorney  Does patient want to make changes to medical advance directive? No - Patient declined  Copy of Healthcare Power of Attorney in Chart? No - copy requested     Chief Complaint  Patient presents with   Acute Visit    Toe infection    HPI:  Pt is a 89 y.o. female seen today for acute visit due to toe infection.   She currently resides on the skilled nursing unit at [facility]. PMH: afib, aortic atherosclerosis, HTN, carotid stenosis, tricuspid valve surgery 2014, TIA, neurocognitive disorder due to vascular disease,cerebral amyloid angiopathy,  interstitial lung disease, colon cancer s/p immunotherapy, diverticulosis, GERD, GI Bleed, hypothyroidism, RA, osteopenia, OAB, anemia and depression.   Wound nurse reports increased tenderness to right foot 4th toe. She has a small open lesion with tan drainage. Wound nurse was able to debride area yesterday. Appears she has increased tenderness and some erythema today. Afebrile. Vitals stable.     Past Medical History:  Diagnosis Date   Allergy    seasonal   Anemia    Anxiety    Arthritis    Back   Asymptomatic menopausal state    Atrial fibrillation (HCC)    Cancer (HCC) 2006   Colon.  Basal Cell Skin cancer- right arm   Cataract    removed bilateral   Chronic atrial fibrillation (HCC)    Colon cancer (HCC)    Constipation due to pain medication therapy    after heart surgery   Deficiency of other specified B group vitamins    Diplopia    Disorientation, unspecified    Dysrhythmia    PAF   GERD (gastroesophageal reflux disease)    Heart murmur    Hyperlipidemia    Hypertension    Hypothyroidism    Malignant neoplasm of colon, unspecified (HCC)    Other specified diseases of blood and blood-forming organs    Other specified disorders of bone density and structure, unspecified forearm    Other thrombophilia (HCC)    per matrix   Overactive bladder    per Matrix   Personal history of other malignant neoplasm of large intestine    Presbycusis, bilateral    per matrix   RA (rheumatoid arthritis) (HCC)    Restless leg    Seizures (HCC)    after Heart Surgey   Stroke (HCC)    TIA- found by neurologist after    Valgus deformity, not elsewhere classified, right knee    per matrix   Vascular dementia, mild, without behavioral disturbance, psychotic disturbance, mood  disturbance, and anxiety (HCC)    per matrix   Past Surgical History:  Procedure Laterality Date   ABDOMINAL HYSTERECTOMY  1970   Partial    COLON RESECTION  2006   cancer   COLON SURGERY     COLONOSCOPY     EYE SURGERY Bilateral    Cataract   MAXIMUM ACCESS (MAS)POSTERIOR LUMBAR INTERBODY FUSION (PLIF) 2 LEVEL N/A 04/12/2015   Procedure: Lumbar Three-Five Decompression, Pedicle Screw Fixation, and Posteriolateral Arthrodesis;  Surgeon: Maeola Harman, MD;  Location: [facility];  Service: Neurosurgery;  Laterality: N/A;  L3-4 L4-5 Maximum access posterior lumbar fusion, possible interbodies  and resection of synovial cyst at L4-5   MITRAL VALVE REPAIR  01/20/2013   Gore-tex cords to P1, P2, and P3. Magic suture to posterior medial commisure, #30 Physio 1 ring. Done in [place]   TONSILLECTOMY     about 1940   TRICUSPID VALVE SURGERY  01/20/2013   #28 TriAd ring done in [place]    Allergies  Allergen Reactions   Claritin [Loratadine] Other (See Comments)    Listed on MAR Unknown reaction   Codeine Other (See Comments)    "just don't take it well"   Gabapentin Other (See Comments)    Dizziness    Molds & Smuts Other (See Comments)    Dust.  Reaction is not listed on MAR    Pollen Extract Swelling    Grass   Bee Venom Swelling and Rash   Wasp Venom Swelling and Rash    Outpatient Encounter Medications as of 06/04/2023  Medication Sig   acetaminophen (TYLENOL) 500 MG tablet Take 1,000 mg by mouth in the morning and at bedtime.   amoxicillin (AMOXIL) 500 MG capsule Take 1,000 mg by mouth 2 (two) times daily.   apixaban (ELIQUIS) 2.5 MG TABS tablet Take 1 tablet (2.5 mg total) by mouth 2 (two) times daily.   atorvastatin (LIPITOR) 20 MG tablet TAKE 1 TABLET DAILY   Cholecalciferol (VITAMIN D) 50 MCG (2000 UT) CAPS Take 2,000 Units by mouth in the morning.   cloNIDine (CATAPRES) 0.1 MG tablet Take 1 tablet (0.1 mg total) by mouth daily as needed. If SBP >180 only   furosemide (LASIX) 20 MG tablet Take 20 mg daily until weight is 124 pounds. Then take 20 mg daily ONLY AS NEEDED for weight of 125 pounds or greater.   guaiFENesin-dextromethorphan (ROBITUSSIN DM) 100-10 MG/5ML syrup Take 20 mLs by mouth every 6 (six) hours as needed for cough.   Iron, Ferrous Sulfate, 325 (65 Fe) MG TABS Take 325 mg by mouth daily.   leflunomide (ARAVA) 20 MG tablet Take 20 mg by mouth in the morning.   levalbuterol (XOPENEX) 0.63 MG/3ML nebulizer solution Take 3 mLs (0.63 mg total) by nebulization every 6 (six) hours as needed for wheezing or shortness of breath.   levothyroxine (SYNTHROID)  75 MCG tablet Take 1 tablet (75 mcg total) by mouth daily.   metoprolol succinate (TOPROL-XL) 100 MG 24 hr tablet Take 1 tablet (100 mg total) by mouth daily. Take 150mg  with or immediately following a meal.   mirtazapine (REMERON) 15 MG tablet TAKE 1 TABLET AT BEDTIME   Multiple Vitamin (MULTIVITAMIN ADULT PO) Take 1 tablet by mouth every morning.   Multiple Vitamins-Minerals (PRESERVISION AREDS 2) CAPS Take 1 capsule by mouth in the morning and at bedtime.   omeprazole (PRILOSEC) 40 MG capsule TAKE 1 CAPSULE DAILY   valsartan (DIOVAN) 160 MG tablet Take 160 mg by mouth daily.  Vibegron (GEMTESA) 75 MG TABS Take 75 mg by mouth daily.   vitamin B-12 (CYANOCOBALAMIN) 1000 MCG tablet Take 1,000 mcg by mouth in the morning.   No facility-administered encounter medications on file as of 06/04/2023.    Review of Systems  Constitutional:  Negative for fever.  Respiratory:  Negative for shortness of breath.   Cardiovascular:  Negative for chest pain.  Skin:  Positive for color change and wound.  Psychiatric/Behavioral:  Negative for dysphoric mood. The patient is not nervous/anxious.     Immunization History  Administered Date(s) Administered   Fluad Quad(high Dose 65+) 01/05/2019, 12/07/2021, 01/15/2023   Fluad Trivalent(High Dose 65+) 01/15/2023   H1N1 04/07/2008   Influenza Whole 12/29/2008, 12/22/2009   Influenza, High Dose Seasonal PF 01/15/2012, 12/23/2014, 01/22/2020   Influenza,inj,Quad PF,6+ Mos 01/22/2014, 12/10/2016, 01/17/2018   Influenza,trivalent, recombinat, inj, PF 01/05/2016   Influenza-Unspecified 04/07/2008, 12/15/2014, 01/13/2021   Moderna Covid-19 Vaccine Bivalent Booster 22yrs & up 01/29/2022, 01/15/2023   Moderna Sars-Covid-2 Vaccination 04/07/2019, 05/05/2019, 02/09/2020   PFIZER(Purple Top)SARS-COV-2 Vaccination 01/06/2021   PPD Test 07/17/2010, 04/19/2015, 04/25/2015   Pfizer Covid-19 Vaccine Bivalent Booster 62yrs & up 07/19/2022   Pneumococcal Conjugate-13  11/10/2013   Pneumococcal Polysaccharide-23 12/27/2010, 12/05/2016   RSV,unspecified 03/28/2022   Tdap 04/15/2017   Zoster Recombinant(Shingrix) 04/09/2017, 06/18/2017   Pertinent  Health Maintenance Due  Topic Date Due   INFLUENZA VACCINE  Completed   DEXA SCAN  Completed      05/21/2022    1:06 PM 06/06/2022    4:55 PM 07/09/2022    1:44 PM 08/29/2022    3:31 PM 04/18/2023   12:52 PM  Fall Risk  Falls in the past year? 1 1 0 1 0  Was there an injury with Fall? 1 1 0 1 0  Fall Risk Category Calculator 3 3 0 2 0  Patient at Risk for Falls Due to History of fall(s);Impaired balance/gait;Impaired mobility History of fall(s);Impaired balance/gait;Impaired mobility History of fall(s);Impaired balance/gait;Impaired mobility    Fall risk Follow up Falls evaluation completed Falls evaluation completed;Education provided;Falls prevention discussed Falls evaluation completed Falls evaluation completed Falls evaluation completed   Functional Status Survey:    Vitals:   06/04/23 1732  BP: (!) 158/76  Pulse: 78  Resp: 17  Temp: (!) 97 F (36.1 C)  SpO2: 94%  Weight: 126 lb (57.2 kg)  Height: 5\' 2"  (1.575 m)   Body mass index is 23.05 kg/m. Physical Exam Vitals reviewed.  Constitutional:      General: She is not in acute distress. HENT:     Head: Normocephalic.  Eyes:     General:        Right eye: No discharge.        Left eye: No discharge.  Cardiovascular:     Rate and Rhythm: Normal rate. Rhythm irregular.     Pulses: Normal pulses.     Heart sounds: Normal heart sounds.  Pulmonary:     Effort: Pulmonary effort is normal.     Breath sounds: Normal breath sounds.  Musculoskeletal:     Cervical back: Neck supple.  Skin:    Findings: Lesion present.     Comments: Right foot 4th digit with approx 0.25 cm open lesion, wound bed with granulation tissue, tan drainage on bandage, tender to touch, mild erythema surrounding lesion, no warmth, surrounding tissue intact.    Neurological:     General: No focal deficit present.     Mental Status: She is alert.  Gait: Gait abnormal.  Psychiatric:        Mood and Affect: Mood normal.     Labs reviewed: Recent Labs    06/22/22 1619 08/06/22 1327 10/18/22 0000 12/31/22 0000 01/07/23 0000 02/05/23 1146  NA 136 137   < > 135* 137 138  K 4.7 4.1   < > 4.0 4.0 4.1  CL 99 106   < > 104 102 104  CO2 22 23   < > 22 23* 24  GLUCOSE 100* 106*  --   --   --  96  BUN 18 19   < > 18 23* 34*  CREATININE 0.72 0.76   < > 0.9 0.9 1.15*  CALCIUM 9.5 9.4   < > 8.8 8.9 9.8   < > = values in this interval not displayed.   Recent Labs    10/18/22 0000 12/31/22 0000 02/05/23 1146  AST 17 26 18   ALT 16 36* 19  ALKPHOS 90 104 91  BILITOT  --   --  0.8  PROT  --   --  7.1  ALBUMIN 4.4 4.1 4.1   Recent Labs    08/06/22 1327 10/11/22 1405 10/18/22 0000 10/30/22 0000 12/31/22 0000 02/05/23 1146 04/04/23 0000  WBC 7.6 9.9 8.1   < > 6.4 7.6 5.8  NEUTROABS  --  5.7 4.90  --  3.70 4.5  --   HGB 11.7* 11.9* 12.2   < > 10.6* 11.5* 11.0*  HCT 35.2* 35.6* 36   < > 31* 34.6* 33*  MCV 100.9* 102.6*  --   --   --  102.4*  --   PLT 170 193 188   < > 157 163 144*   < > = values in this interval not displayed.   Lab Results  Component Value Date   TSH 12.60 (A) 04/04/2023   Lab Results  Component Value Date   HGBA1C 5.5 01/12/2013   Lab Results  Component Value Date   CHOL 108 04/04/2023   HDL 49 04/04/2023   LDLCALC 41 04/04/2023   TRIG 93 04/04/2023    Significant Diagnostic Results in last 30 days:  No results found.  Assessment/Plan 1. Cellulitis of fourth toe of right foot (Primary) - noted 03/10 per wound nurse - tenderness, erythema, tan drainage present - change non adherent foam dressing QOD - doxycycline (VIBRA-TABS) 100 MG tablet; Take 1 tablet (100 mg total) by mouth 2 (two) times daily for 7 days.     Family/ staff Communication: plan discussed with patient and nurse  Labs/tests  ordered:  none

## 2023-06-05 DIAGNOSIS — F0150 Vascular dementia without behavioral disturbance: Secondary | ICD-10-CM | POA: Diagnosis not present

## 2023-06-05 DIAGNOSIS — M62561 Muscle wasting and atrophy, not elsewhere classified, right lower leg: Secondary | ICD-10-CM | POA: Diagnosis not present

## 2023-06-05 DIAGNOSIS — M6289 Other specified disorders of muscle: Secondary | ICD-10-CM | POA: Diagnosis not present

## 2023-06-05 DIAGNOSIS — F01A0 Vascular dementia, mild, without behavioral disturbance, psychotic disturbance, mood disturbance, and anxiety: Secondary | ICD-10-CM | POA: Diagnosis not present

## 2023-06-05 DIAGNOSIS — R2689 Other abnormalities of gait and mobility: Secondary | ICD-10-CM | POA: Diagnosis not present

## 2023-06-05 DIAGNOSIS — R41841 Cognitive communication deficit: Secondary | ICD-10-CM | POA: Diagnosis not present

## 2023-06-05 MED ORDER — DOXYCYCLINE HYCLATE 100 MG PO TABS: 100.0000 mg | ORAL_TABLET | Freq: Two times a day (BID) | ORAL | Status: AC

## 2023-06-06 DIAGNOSIS — F0150 Vascular dementia without behavioral disturbance: Secondary | ICD-10-CM | POA: Diagnosis not present

## 2023-06-06 DIAGNOSIS — R41841 Cognitive communication deficit: Secondary | ICD-10-CM | POA: Diagnosis not present

## 2023-06-06 DIAGNOSIS — M6289 Other specified disorders of muscle: Secondary | ICD-10-CM | POA: Diagnosis not present

## 2023-06-06 DIAGNOSIS — R2689 Other abnormalities of gait and mobility: Secondary | ICD-10-CM | POA: Diagnosis not present

## 2023-06-06 DIAGNOSIS — M62561 Muscle wasting and atrophy, not elsewhere classified, right lower leg: Secondary | ICD-10-CM | POA: Diagnosis not present

## 2023-06-06 DIAGNOSIS — F01A0 Vascular dementia, mild, without behavioral disturbance, psychotic disturbance, mood disturbance, and anxiety: Secondary | ICD-10-CM | POA: Diagnosis not present

## 2023-06-07 DIAGNOSIS — F0150 Vascular dementia without behavioral disturbance: Secondary | ICD-10-CM | POA: Diagnosis not present

## 2023-06-07 DIAGNOSIS — M62561 Muscle wasting and atrophy, not elsewhere classified, right lower leg: Secondary | ICD-10-CM | POA: Diagnosis not present

## 2023-06-07 DIAGNOSIS — M6289 Other specified disorders of muscle: Secondary | ICD-10-CM | POA: Diagnosis not present

## 2023-06-07 DIAGNOSIS — R2689 Other abnormalities of gait and mobility: Secondary | ICD-10-CM | POA: Diagnosis not present

## 2023-06-07 DIAGNOSIS — R41841 Cognitive communication deficit: Secondary | ICD-10-CM | POA: Diagnosis not present

## 2023-06-07 DIAGNOSIS — F01A0 Vascular dementia, mild, without behavioral disturbance, psychotic disturbance, mood disturbance, and anxiety: Secondary | ICD-10-CM | POA: Diagnosis not present

## 2023-06-10 DIAGNOSIS — R41841 Cognitive communication deficit: Secondary | ICD-10-CM | POA: Diagnosis not present

## 2023-06-10 DIAGNOSIS — R2689 Other abnormalities of gait and mobility: Secondary | ICD-10-CM | POA: Diagnosis not present

## 2023-06-10 DIAGNOSIS — M6289 Other specified disorders of muscle: Secondary | ICD-10-CM | POA: Diagnosis not present

## 2023-06-10 DIAGNOSIS — M62561 Muscle wasting and atrophy, not elsewhere classified, right lower leg: Secondary | ICD-10-CM | POA: Diagnosis not present

## 2023-06-10 DIAGNOSIS — F01A0 Vascular dementia, mild, without behavioral disturbance, psychotic disturbance, mood disturbance, and anxiety: Secondary | ICD-10-CM | POA: Diagnosis not present

## 2023-06-10 DIAGNOSIS — F0150 Vascular dementia without behavioral disturbance: Secondary | ICD-10-CM | POA: Diagnosis not present

## 2023-06-11 DIAGNOSIS — F01A0 Vascular dementia, mild, without behavioral disturbance, psychotic disturbance, mood disturbance, and anxiety: Secondary | ICD-10-CM | POA: Diagnosis not present

## 2023-06-11 DIAGNOSIS — M6289 Other specified disorders of muscle: Secondary | ICD-10-CM | POA: Diagnosis not present

## 2023-06-11 DIAGNOSIS — R2689 Other abnormalities of gait and mobility: Secondary | ICD-10-CM | POA: Diagnosis not present

## 2023-06-11 DIAGNOSIS — M62561 Muscle wasting and atrophy, not elsewhere classified, right lower leg: Secondary | ICD-10-CM | POA: Diagnosis not present

## 2023-06-11 DIAGNOSIS — R41841 Cognitive communication deficit: Secondary | ICD-10-CM | POA: Diagnosis not present

## 2023-06-11 DIAGNOSIS — F0150 Vascular dementia without behavioral disturbance: Secondary | ICD-10-CM | POA: Diagnosis not present

## 2023-06-12 DIAGNOSIS — R41841 Cognitive communication deficit: Secondary | ICD-10-CM | POA: Diagnosis not present

## 2023-06-12 DIAGNOSIS — F0150 Vascular dementia without behavioral disturbance: Secondary | ICD-10-CM | POA: Diagnosis not present

## 2023-06-12 DIAGNOSIS — M6289 Other specified disorders of muscle: Secondary | ICD-10-CM | POA: Diagnosis not present

## 2023-06-12 DIAGNOSIS — M62561 Muscle wasting and atrophy, not elsewhere classified, right lower leg: Secondary | ICD-10-CM | POA: Diagnosis not present

## 2023-06-12 DIAGNOSIS — F01A0 Vascular dementia, mild, without behavioral disturbance, psychotic disturbance, mood disturbance, and anxiety: Secondary | ICD-10-CM | POA: Diagnosis not present

## 2023-06-12 DIAGNOSIS — R2689 Other abnormalities of gait and mobility: Secondary | ICD-10-CM | POA: Diagnosis not present

## 2023-06-13 DIAGNOSIS — F0150 Vascular dementia without behavioral disturbance: Secondary | ICD-10-CM | POA: Diagnosis not present

## 2023-06-13 DIAGNOSIS — F01A0 Vascular dementia, mild, without behavioral disturbance, psychotic disturbance, mood disturbance, and anxiety: Secondary | ICD-10-CM | POA: Diagnosis not present

## 2023-06-13 DIAGNOSIS — R41841 Cognitive communication deficit: Secondary | ICD-10-CM | POA: Diagnosis not present

## 2023-06-13 DIAGNOSIS — R2689 Other abnormalities of gait and mobility: Secondary | ICD-10-CM | POA: Diagnosis not present

## 2023-06-13 DIAGNOSIS — M62561 Muscle wasting and atrophy, not elsewhere classified, right lower leg: Secondary | ICD-10-CM | POA: Diagnosis not present

## 2023-06-13 DIAGNOSIS — M6289 Other specified disorders of muscle: Secondary | ICD-10-CM | POA: Diagnosis not present

## 2023-06-14 DIAGNOSIS — M6289 Other specified disorders of muscle: Secondary | ICD-10-CM | POA: Diagnosis not present

## 2023-06-14 DIAGNOSIS — F0150 Vascular dementia without behavioral disturbance: Secondary | ICD-10-CM | POA: Diagnosis not present

## 2023-06-14 DIAGNOSIS — R2689 Other abnormalities of gait and mobility: Secondary | ICD-10-CM | POA: Diagnosis not present

## 2023-06-14 DIAGNOSIS — R41841 Cognitive communication deficit: Secondary | ICD-10-CM | POA: Diagnosis not present

## 2023-06-14 DIAGNOSIS — M62561 Muscle wasting and atrophy, not elsewhere classified, right lower leg: Secondary | ICD-10-CM | POA: Diagnosis not present

## 2023-06-14 DIAGNOSIS — F01A0 Vascular dementia, mild, without behavioral disturbance, psychotic disturbance, mood disturbance, and anxiety: Secondary | ICD-10-CM | POA: Diagnosis not present

## 2023-06-17 DIAGNOSIS — R2689 Other abnormalities of gait and mobility: Secondary | ICD-10-CM | POA: Diagnosis not present

## 2023-06-17 DIAGNOSIS — R41841 Cognitive communication deficit: Secondary | ICD-10-CM | POA: Diagnosis not present

## 2023-06-17 DIAGNOSIS — F0150 Vascular dementia without behavioral disturbance: Secondary | ICD-10-CM | POA: Diagnosis not present

## 2023-06-17 DIAGNOSIS — Z961 Presence of intraocular lens: Secondary | ICD-10-CM | POA: Diagnosis not present

## 2023-06-17 DIAGNOSIS — F01A0 Vascular dementia, mild, without behavioral disturbance, psychotic disturbance, mood disturbance, and anxiety: Secondary | ICD-10-CM | POA: Diagnosis not present

## 2023-06-17 DIAGNOSIS — M62561 Muscle wasting and atrophy, not elsewhere classified, right lower leg: Secondary | ICD-10-CM | POA: Diagnosis not present

## 2023-06-17 DIAGNOSIS — H40013 Open angle with borderline findings, low risk, bilateral: Secondary | ICD-10-CM | POA: Diagnosis not present

## 2023-06-17 DIAGNOSIS — M6289 Other specified disorders of muscle: Secondary | ICD-10-CM | POA: Diagnosis not present

## 2023-06-18 DIAGNOSIS — F01A0 Vascular dementia, mild, without behavioral disturbance, psychotic disturbance, mood disturbance, and anxiety: Secondary | ICD-10-CM | POA: Diagnosis not present

## 2023-06-18 DIAGNOSIS — R41841 Cognitive communication deficit: Secondary | ICD-10-CM | POA: Diagnosis not present

## 2023-06-18 DIAGNOSIS — M6289 Other specified disorders of muscle: Secondary | ICD-10-CM | POA: Diagnosis not present

## 2023-06-18 DIAGNOSIS — F0150 Vascular dementia without behavioral disturbance: Secondary | ICD-10-CM | POA: Diagnosis not present

## 2023-06-18 DIAGNOSIS — M62561 Muscle wasting and atrophy, not elsewhere classified, right lower leg: Secondary | ICD-10-CM | POA: Diagnosis not present

## 2023-06-18 DIAGNOSIS — R2689 Other abnormalities of gait and mobility: Secondary | ICD-10-CM | POA: Diagnosis not present

## 2023-06-19 DIAGNOSIS — R41841 Cognitive communication deficit: Secondary | ICD-10-CM | POA: Diagnosis not present

## 2023-06-19 DIAGNOSIS — M6289 Other specified disorders of muscle: Secondary | ICD-10-CM | POA: Diagnosis not present

## 2023-06-19 DIAGNOSIS — F01A0 Vascular dementia, mild, without behavioral disturbance, psychotic disturbance, mood disturbance, and anxiety: Secondary | ICD-10-CM | POA: Diagnosis not present

## 2023-06-19 DIAGNOSIS — M62561 Muscle wasting and atrophy, not elsewhere classified, right lower leg: Secondary | ICD-10-CM | POA: Diagnosis not present

## 2023-06-19 DIAGNOSIS — F0150 Vascular dementia without behavioral disturbance: Secondary | ICD-10-CM | POA: Diagnosis not present

## 2023-06-19 DIAGNOSIS — R2689 Other abnormalities of gait and mobility: Secondary | ICD-10-CM | POA: Diagnosis not present

## 2023-06-20 DIAGNOSIS — M62561 Muscle wasting and atrophy, not elsewhere classified, right lower leg: Secondary | ICD-10-CM | POA: Diagnosis not present

## 2023-06-20 DIAGNOSIS — R2689 Other abnormalities of gait and mobility: Secondary | ICD-10-CM | POA: Diagnosis not present

## 2023-06-20 DIAGNOSIS — F01A0 Vascular dementia, mild, without behavioral disturbance, psychotic disturbance, mood disturbance, and anxiety: Secondary | ICD-10-CM | POA: Diagnosis not present

## 2023-06-20 DIAGNOSIS — M6289 Other specified disorders of muscle: Secondary | ICD-10-CM | POA: Diagnosis not present

## 2023-06-20 DIAGNOSIS — F0150 Vascular dementia without behavioral disturbance: Secondary | ICD-10-CM | POA: Diagnosis not present

## 2023-06-20 DIAGNOSIS — R41841 Cognitive communication deficit: Secondary | ICD-10-CM | POA: Diagnosis not present

## 2023-06-21 DIAGNOSIS — F0150 Vascular dementia without behavioral disturbance: Secondary | ICD-10-CM | POA: Diagnosis not present

## 2023-06-21 DIAGNOSIS — R41841 Cognitive communication deficit: Secondary | ICD-10-CM | POA: Diagnosis not present

## 2023-06-21 DIAGNOSIS — M6289 Other specified disorders of muscle: Secondary | ICD-10-CM | POA: Diagnosis not present

## 2023-06-21 DIAGNOSIS — F01A0 Vascular dementia, mild, without behavioral disturbance, psychotic disturbance, mood disturbance, and anxiety: Secondary | ICD-10-CM | POA: Diagnosis not present

## 2023-06-21 DIAGNOSIS — R2689 Other abnormalities of gait and mobility: Secondary | ICD-10-CM | POA: Diagnosis not present

## 2023-06-21 DIAGNOSIS — M62561 Muscle wasting and atrophy, not elsewhere classified, right lower leg: Secondary | ICD-10-CM | POA: Diagnosis not present

## 2023-06-24 DIAGNOSIS — R2689 Other abnormalities of gait and mobility: Secondary | ICD-10-CM | POA: Diagnosis not present

## 2023-06-24 DIAGNOSIS — R41841 Cognitive communication deficit: Secondary | ICD-10-CM | POA: Diagnosis not present

## 2023-06-24 DIAGNOSIS — M6289 Other specified disorders of muscle: Secondary | ICD-10-CM | POA: Diagnosis not present

## 2023-06-24 DIAGNOSIS — F01A0 Vascular dementia, mild, without behavioral disturbance, psychotic disturbance, mood disturbance, and anxiety: Secondary | ICD-10-CM | POA: Diagnosis not present

## 2023-06-24 DIAGNOSIS — M62561 Muscle wasting and atrophy, not elsewhere classified, right lower leg: Secondary | ICD-10-CM | POA: Diagnosis not present

## 2023-06-24 DIAGNOSIS — F0150 Vascular dementia without behavioral disturbance: Secondary | ICD-10-CM | POA: Diagnosis not present

## 2023-06-25 ENCOUNTER — Encounter: Payer: Self-pay | Admitting: Internal Medicine

## 2023-06-25 DIAGNOSIS — F0150 Vascular dementia without behavioral disturbance: Secondary | ICD-10-CM | POA: Diagnosis not present

## 2023-06-25 DIAGNOSIS — R41841 Cognitive communication deficit: Secondary | ICD-10-CM | POA: Diagnosis not present

## 2023-06-25 DIAGNOSIS — M62562 Muscle wasting and atrophy, not elsewhere classified, left lower leg: Secondary | ICD-10-CM | POA: Diagnosis not present

## 2023-06-25 DIAGNOSIS — R2689 Other abnormalities of gait and mobility: Secondary | ICD-10-CM | POA: Diagnosis not present

## 2023-06-25 DIAGNOSIS — F01A0 Vascular dementia, mild, without behavioral disturbance, psychotic disturbance, mood disturbance, and anxiety: Secondary | ICD-10-CM | POA: Diagnosis not present

## 2023-06-25 DIAGNOSIS — M6289 Other specified disorders of muscle: Secondary | ICD-10-CM | POA: Diagnosis not present

## 2023-06-25 DIAGNOSIS — J101 Influenza due to other identified influenza virus with other respiratory manifestations: Secondary | ICD-10-CM | POA: Diagnosis not present

## 2023-06-25 DIAGNOSIS — M62561 Muscle wasting and atrophy, not elsewhere classified, right lower leg: Secondary | ICD-10-CM | POA: Diagnosis not present

## 2023-06-26 DIAGNOSIS — R2689 Other abnormalities of gait and mobility: Secondary | ICD-10-CM | POA: Diagnosis not present

## 2023-06-26 DIAGNOSIS — M62561 Muscle wasting and atrophy, not elsewhere classified, right lower leg: Secondary | ICD-10-CM | POA: Diagnosis not present

## 2023-06-26 DIAGNOSIS — F0150 Vascular dementia without behavioral disturbance: Secondary | ICD-10-CM | POA: Diagnosis not present

## 2023-06-26 DIAGNOSIS — R41841 Cognitive communication deficit: Secondary | ICD-10-CM | POA: Diagnosis not present

## 2023-06-26 DIAGNOSIS — F01A0 Vascular dementia, mild, without behavioral disturbance, psychotic disturbance, mood disturbance, and anxiety: Secondary | ICD-10-CM | POA: Diagnosis not present

## 2023-06-26 DIAGNOSIS — M6289 Other specified disorders of muscle: Secondary | ICD-10-CM | POA: Diagnosis not present

## 2023-06-28 DIAGNOSIS — F01A0 Vascular dementia, mild, without behavioral disturbance, psychotic disturbance, mood disturbance, and anxiety: Secondary | ICD-10-CM | POA: Diagnosis not present

## 2023-06-28 DIAGNOSIS — M6289 Other specified disorders of muscle: Secondary | ICD-10-CM | POA: Diagnosis not present

## 2023-06-28 DIAGNOSIS — R41841 Cognitive communication deficit: Secondary | ICD-10-CM | POA: Diagnosis not present

## 2023-06-28 DIAGNOSIS — M62561 Muscle wasting and atrophy, not elsewhere classified, right lower leg: Secondary | ICD-10-CM | POA: Diagnosis not present

## 2023-06-28 DIAGNOSIS — F0150 Vascular dementia without behavioral disturbance: Secondary | ICD-10-CM | POA: Diagnosis not present

## 2023-06-28 DIAGNOSIS — R2689 Other abnormalities of gait and mobility: Secondary | ICD-10-CM | POA: Diagnosis not present

## 2023-07-01 DIAGNOSIS — R41841 Cognitive communication deficit: Secondary | ICD-10-CM | POA: Diagnosis not present

## 2023-07-01 DIAGNOSIS — F0150 Vascular dementia without behavioral disturbance: Secondary | ICD-10-CM | POA: Diagnosis not present

## 2023-07-01 DIAGNOSIS — M62561 Muscle wasting and atrophy, not elsewhere classified, right lower leg: Secondary | ICD-10-CM | POA: Diagnosis not present

## 2023-07-01 DIAGNOSIS — F01A0 Vascular dementia, mild, without behavioral disturbance, psychotic disturbance, mood disturbance, and anxiety: Secondary | ICD-10-CM | POA: Diagnosis not present

## 2023-07-01 DIAGNOSIS — M6289 Other specified disorders of muscle: Secondary | ICD-10-CM | POA: Diagnosis not present

## 2023-07-01 DIAGNOSIS — R2689 Other abnormalities of gait and mobility: Secondary | ICD-10-CM | POA: Diagnosis not present

## 2023-07-02 DIAGNOSIS — F01A0 Vascular dementia, mild, without behavioral disturbance, psychotic disturbance, mood disturbance, and anxiety: Secondary | ICD-10-CM | POA: Diagnosis not present

## 2023-07-02 DIAGNOSIS — M6289 Other specified disorders of muscle: Secondary | ICD-10-CM | POA: Diagnosis not present

## 2023-07-02 DIAGNOSIS — R2689 Other abnormalities of gait and mobility: Secondary | ICD-10-CM | POA: Diagnosis not present

## 2023-07-02 DIAGNOSIS — F0150 Vascular dementia without behavioral disturbance: Secondary | ICD-10-CM | POA: Diagnosis not present

## 2023-07-02 DIAGNOSIS — M62561 Muscle wasting and atrophy, not elsewhere classified, right lower leg: Secondary | ICD-10-CM | POA: Diagnosis not present

## 2023-07-02 DIAGNOSIS — R41841 Cognitive communication deficit: Secondary | ICD-10-CM | POA: Diagnosis not present

## 2023-07-03 DIAGNOSIS — R2689 Other abnormalities of gait and mobility: Secondary | ICD-10-CM | POA: Diagnosis not present

## 2023-07-03 DIAGNOSIS — F01A0 Vascular dementia, mild, without behavioral disturbance, psychotic disturbance, mood disturbance, and anxiety: Secondary | ICD-10-CM | POA: Diagnosis not present

## 2023-07-03 DIAGNOSIS — R41841 Cognitive communication deficit: Secondary | ICD-10-CM | POA: Diagnosis not present

## 2023-07-03 DIAGNOSIS — M62561 Muscle wasting and atrophy, not elsewhere classified, right lower leg: Secondary | ICD-10-CM | POA: Diagnosis not present

## 2023-07-03 DIAGNOSIS — M6289 Other specified disorders of muscle: Secondary | ICD-10-CM | POA: Diagnosis not present

## 2023-07-03 DIAGNOSIS — F0150 Vascular dementia without behavioral disturbance: Secondary | ICD-10-CM | POA: Diagnosis not present

## 2023-07-04 ENCOUNTER — Encounter: Payer: Self-pay | Admitting: Oncology

## 2023-07-04 DIAGNOSIS — F01A0 Vascular dementia, mild, without behavioral disturbance, psychotic disturbance, mood disturbance, and anxiety: Secondary | ICD-10-CM | POA: Diagnosis not present

## 2023-07-04 DIAGNOSIS — M5136 Other intervertebral disc degeneration, lumbar region with discogenic back pain only: Secondary | ICD-10-CM | POA: Diagnosis not present

## 2023-07-04 DIAGNOSIS — M6289 Other specified disorders of muscle: Secondary | ICD-10-CM | POA: Diagnosis not present

## 2023-07-04 DIAGNOSIS — R2689 Other abnormalities of gait and mobility: Secondary | ICD-10-CM | POA: Diagnosis not present

## 2023-07-04 DIAGNOSIS — F0150 Vascular dementia without behavioral disturbance: Secondary | ICD-10-CM | POA: Diagnosis not present

## 2023-07-04 DIAGNOSIS — M0579 Rheumatoid arthritis with rheumatoid factor of multiple sites without organ or systems involvement: Secondary | ICD-10-CM | POA: Diagnosis not present

## 2023-07-04 DIAGNOSIS — M1991 Primary osteoarthritis, unspecified site: Secondary | ICD-10-CM | POA: Diagnosis not present

## 2023-07-04 DIAGNOSIS — Z111 Encounter for screening for respiratory tuberculosis: Secondary | ICD-10-CM | POA: Diagnosis not present

## 2023-07-04 DIAGNOSIS — Z79899 Other long term (current) drug therapy: Secondary | ICD-10-CM | POA: Diagnosis not present

## 2023-07-04 DIAGNOSIS — R41841 Cognitive communication deficit: Secondary | ICD-10-CM | POA: Diagnosis not present

## 2023-07-04 DIAGNOSIS — M8580 Other specified disorders of bone density and structure, unspecified site: Secondary | ICD-10-CM | POA: Diagnosis not present

## 2023-07-04 DIAGNOSIS — M62561 Muscle wasting and atrophy, not elsewhere classified, right lower leg: Secondary | ICD-10-CM | POA: Diagnosis not present

## 2023-07-04 DIAGNOSIS — Z6823 Body mass index (BMI) 23.0-23.9, adult: Secondary | ICD-10-CM | POA: Diagnosis not present

## 2023-07-05 DIAGNOSIS — R41841 Cognitive communication deficit: Secondary | ICD-10-CM | POA: Diagnosis not present

## 2023-07-05 DIAGNOSIS — M62561 Muscle wasting and atrophy, not elsewhere classified, right lower leg: Secondary | ICD-10-CM | POA: Diagnosis not present

## 2023-07-05 DIAGNOSIS — F0150 Vascular dementia without behavioral disturbance: Secondary | ICD-10-CM | POA: Diagnosis not present

## 2023-07-05 DIAGNOSIS — F01A0 Vascular dementia, mild, without behavioral disturbance, psychotic disturbance, mood disturbance, and anxiety: Secondary | ICD-10-CM | POA: Diagnosis not present

## 2023-07-05 DIAGNOSIS — R2689 Other abnormalities of gait and mobility: Secondary | ICD-10-CM | POA: Diagnosis not present

## 2023-07-05 DIAGNOSIS — M6289 Other specified disorders of muscle: Secondary | ICD-10-CM | POA: Diagnosis not present

## 2023-07-06 DIAGNOSIS — R2689 Other abnormalities of gait and mobility: Secondary | ICD-10-CM | POA: Diagnosis not present

## 2023-07-06 DIAGNOSIS — M62561 Muscle wasting and atrophy, not elsewhere classified, right lower leg: Secondary | ICD-10-CM | POA: Diagnosis not present

## 2023-07-06 DIAGNOSIS — F01A0 Vascular dementia, mild, without behavioral disturbance, psychotic disturbance, mood disturbance, and anxiety: Secondary | ICD-10-CM | POA: Diagnosis not present

## 2023-07-06 DIAGNOSIS — F0150 Vascular dementia without behavioral disturbance: Secondary | ICD-10-CM | POA: Diagnosis not present

## 2023-07-06 DIAGNOSIS — M6289 Other specified disorders of muscle: Secondary | ICD-10-CM | POA: Diagnosis not present

## 2023-07-06 DIAGNOSIS — R41841 Cognitive communication deficit: Secondary | ICD-10-CM | POA: Diagnosis not present

## 2023-07-08 DIAGNOSIS — R2689 Other abnormalities of gait and mobility: Secondary | ICD-10-CM | POA: Diagnosis not present

## 2023-07-08 DIAGNOSIS — F0150 Vascular dementia without behavioral disturbance: Secondary | ICD-10-CM | POA: Diagnosis not present

## 2023-07-08 DIAGNOSIS — M6289 Other specified disorders of muscle: Secondary | ICD-10-CM | POA: Diagnosis not present

## 2023-07-08 DIAGNOSIS — F01A0 Vascular dementia, mild, without behavioral disturbance, psychotic disturbance, mood disturbance, and anxiety: Secondary | ICD-10-CM | POA: Diagnosis not present

## 2023-07-08 DIAGNOSIS — R41841 Cognitive communication deficit: Secondary | ICD-10-CM | POA: Diagnosis not present

## 2023-07-08 DIAGNOSIS — M62561 Muscle wasting and atrophy, not elsewhere classified, right lower leg: Secondary | ICD-10-CM | POA: Diagnosis not present

## 2023-07-09 DIAGNOSIS — M62561 Muscle wasting and atrophy, not elsewhere classified, right lower leg: Secondary | ICD-10-CM | POA: Diagnosis not present

## 2023-07-09 DIAGNOSIS — R41841 Cognitive communication deficit: Secondary | ICD-10-CM | POA: Diagnosis not present

## 2023-07-09 DIAGNOSIS — M6289 Other specified disorders of muscle: Secondary | ICD-10-CM | POA: Diagnosis not present

## 2023-07-09 DIAGNOSIS — F01A0 Vascular dementia, mild, without behavioral disturbance, psychotic disturbance, mood disturbance, and anxiety: Secondary | ICD-10-CM | POA: Diagnosis not present

## 2023-07-09 DIAGNOSIS — F0150 Vascular dementia without behavioral disturbance: Secondary | ICD-10-CM | POA: Diagnosis not present

## 2023-07-09 DIAGNOSIS — R2689 Other abnormalities of gait and mobility: Secondary | ICD-10-CM | POA: Diagnosis not present

## 2023-07-10 DIAGNOSIS — M6289 Other specified disorders of muscle: Secondary | ICD-10-CM | POA: Diagnosis not present

## 2023-07-10 DIAGNOSIS — R2689 Other abnormalities of gait and mobility: Secondary | ICD-10-CM | POA: Diagnosis not present

## 2023-07-10 DIAGNOSIS — F01A0 Vascular dementia, mild, without behavioral disturbance, psychotic disturbance, mood disturbance, and anxiety: Secondary | ICD-10-CM | POA: Diagnosis not present

## 2023-07-10 DIAGNOSIS — R41841 Cognitive communication deficit: Secondary | ICD-10-CM | POA: Diagnosis not present

## 2023-07-10 DIAGNOSIS — F0150 Vascular dementia without behavioral disturbance: Secondary | ICD-10-CM | POA: Diagnosis not present

## 2023-07-10 DIAGNOSIS — M62561 Muscle wasting and atrophy, not elsewhere classified, right lower leg: Secondary | ICD-10-CM | POA: Diagnosis not present

## 2023-07-11 DIAGNOSIS — F0150 Vascular dementia without behavioral disturbance: Secondary | ICD-10-CM | POA: Diagnosis not present

## 2023-07-11 DIAGNOSIS — F01A0 Vascular dementia, mild, without behavioral disturbance, psychotic disturbance, mood disturbance, and anxiety: Secondary | ICD-10-CM | POA: Diagnosis not present

## 2023-07-11 DIAGNOSIS — M6289 Other specified disorders of muscle: Secondary | ICD-10-CM | POA: Diagnosis not present

## 2023-07-11 DIAGNOSIS — R41841 Cognitive communication deficit: Secondary | ICD-10-CM | POA: Diagnosis not present

## 2023-07-11 DIAGNOSIS — R2689 Other abnormalities of gait and mobility: Secondary | ICD-10-CM | POA: Diagnosis not present

## 2023-07-11 DIAGNOSIS — M62561 Muscle wasting and atrophy, not elsewhere classified, right lower leg: Secondary | ICD-10-CM | POA: Diagnosis not present

## 2023-07-12 DIAGNOSIS — R41841 Cognitive communication deficit: Secondary | ICD-10-CM | POA: Diagnosis not present

## 2023-07-12 DIAGNOSIS — R2689 Other abnormalities of gait and mobility: Secondary | ICD-10-CM | POA: Diagnosis not present

## 2023-07-12 DIAGNOSIS — M62561 Muscle wasting and atrophy, not elsewhere classified, right lower leg: Secondary | ICD-10-CM | POA: Diagnosis not present

## 2023-07-12 DIAGNOSIS — F01A0 Vascular dementia, mild, without behavioral disturbance, psychotic disturbance, mood disturbance, and anxiety: Secondary | ICD-10-CM | POA: Diagnosis not present

## 2023-07-12 DIAGNOSIS — M6289 Other specified disorders of muscle: Secondary | ICD-10-CM | POA: Diagnosis not present

## 2023-07-12 DIAGNOSIS — F0150 Vascular dementia without behavioral disturbance: Secondary | ICD-10-CM | POA: Diagnosis not present

## 2023-07-15 DIAGNOSIS — F0150 Vascular dementia without behavioral disturbance: Secondary | ICD-10-CM | POA: Diagnosis not present

## 2023-07-15 DIAGNOSIS — R2689 Other abnormalities of gait and mobility: Secondary | ICD-10-CM | POA: Diagnosis not present

## 2023-07-15 DIAGNOSIS — F01A0 Vascular dementia, mild, without behavioral disturbance, psychotic disturbance, mood disturbance, and anxiety: Secondary | ICD-10-CM | POA: Diagnosis not present

## 2023-07-15 DIAGNOSIS — R41841 Cognitive communication deficit: Secondary | ICD-10-CM | POA: Diagnosis not present

## 2023-07-15 DIAGNOSIS — M62561 Muscle wasting and atrophy, not elsewhere classified, right lower leg: Secondary | ICD-10-CM | POA: Diagnosis not present

## 2023-07-15 DIAGNOSIS — M6289 Other specified disorders of muscle: Secondary | ICD-10-CM | POA: Diagnosis not present

## 2023-07-17 DIAGNOSIS — M6289 Other specified disorders of muscle: Secondary | ICD-10-CM | POA: Diagnosis not present

## 2023-07-17 DIAGNOSIS — F0150 Vascular dementia without behavioral disturbance: Secondary | ICD-10-CM | POA: Diagnosis not present

## 2023-07-17 DIAGNOSIS — F01A0 Vascular dementia, mild, without behavioral disturbance, psychotic disturbance, mood disturbance, and anxiety: Secondary | ICD-10-CM | POA: Diagnosis not present

## 2023-07-17 DIAGNOSIS — M62561 Muscle wasting and atrophy, not elsewhere classified, right lower leg: Secondary | ICD-10-CM | POA: Diagnosis not present

## 2023-07-17 DIAGNOSIS — R2689 Other abnormalities of gait and mobility: Secondary | ICD-10-CM | POA: Diagnosis not present

## 2023-07-17 DIAGNOSIS — R41841 Cognitive communication deficit: Secondary | ICD-10-CM | POA: Diagnosis not present

## 2023-07-19 DIAGNOSIS — R2689 Other abnormalities of gait and mobility: Secondary | ICD-10-CM | POA: Diagnosis not present

## 2023-07-19 DIAGNOSIS — F0150 Vascular dementia without behavioral disturbance: Secondary | ICD-10-CM | POA: Diagnosis not present

## 2023-07-19 DIAGNOSIS — M6289 Other specified disorders of muscle: Secondary | ICD-10-CM | POA: Diagnosis not present

## 2023-07-19 DIAGNOSIS — M62561 Muscle wasting and atrophy, not elsewhere classified, right lower leg: Secondary | ICD-10-CM | POA: Diagnosis not present

## 2023-07-19 DIAGNOSIS — F01A0 Vascular dementia, mild, without behavioral disturbance, psychotic disturbance, mood disturbance, and anxiety: Secondary | ICD-10-CM | POA: Diagnosis not present

## 2023-07-19 DIAGNOSIS — R41841 Cognitive communication deficit: Secondary | ICD-10-CM | POA: Diagnosis not present

## 2023-07-22 DIAGNOSIS — R2689 Other abnormalities of gait and mobility: Secondary | ICD-10-CM | POA: Diagnosis not present

## 2023-07-22 DIAGNOSIS — F01A0 Vascular dementia, mild, without behavioral disturbance, psychotic disturbance, mood disturbance, and anxiety: Secondary | ICD-10-CM | POA: Diagnosis not present

## 2023-07-22 DIAGNOSIS — F0150 Vascular dementia without behavioral disturbance: Secondary | ICD-10-CM | POA: Diagnosis not present

## 2023-07-22 DIAGNOSIS — R41841 Cognitive communication deficit: Secondary | ICD-10-CM | POA: Diagnosis not present

## 2023-07-22 DIAGNOSIS — M6289 Other specified disorders of muscle: Secondary | ICD-10-CM | POA: Diagnosis not present

## 2023-07-22 DIAGNOSIS — M62561 Muscle wasting and atrophy, not elsewhere classified, right lower leg: Secondary | ICD-10-CM | POA: Diagnosis not present

## 2023-07-23 DIAGNOSIS — R2689 Other abnormalities of gait and mobility: Secondary | ICD-10-CM | POA: Diagnosis not present

## 2023-07-23 DIAGNOSIS — M62561 Muscle wasting and atrophy, not elsewhere classified, right lower leg: Secondary | ICD-10-CM | POA: Diagnosis not present

## 2023-07-23 DIAGNOSIS — R41841 Cognitive communication deficit: Secondary | ICD-10-CM | POA: Diagnosis not present

## 2023-07-23 DIAGNOSIS — M6289 Other specified disorders of muscle: Secondary | ICD-10-CM | POA: Diagnosis not present

## 2023-07-23 DIAGNOSIS — F01A0 Vascular dementia, mild, without behavioral disturbance, psychotic disturbance, mood disturbance, and anxiety: Secondary | ICD-10-CM | POA: Diagnosis not present

## 2023-07-23 DIAGNOSIS — F0150 Vascular dementia without behavioral disturbance: Secondary | ICD-10-CM | POA: Diagnosis not present

## 2023-07-24 DIAGNOSIS — M6289 Other specified disorders of muscle: Secondary | ICD-10-CM | POA: Diagnosis not present

## 2023-07-24 DIAGNOSIS — R41841 Cognitive communication deficit: Secondary | ICD-10-CM | POA: Diagnosis not present

## 2023-07-24 DIAGNOSIS — F0150 Vascular dementia without behavioral disturbance: Secondary | ICD-10-CM | POA: Diagnosis not present

## 2023-07-24 DIAGNOSIS — R2689 Other abnormalities of gait and mobility: Secondary | ICD-10-CM | POA: Diagnosis not present

## 2023-07-24 DIAGNOSIS — M62561 Muscle wasting and atrophy, not elsewhere classified, right lower leg: Secondary | ICD-10-CM | POA: Diagnosis not present

## 2023-07-24 DIAGNOSIS — F01A0 Vascular dementia, mild, without behavioral disturbance, psychotic disturbance, mood disturbance, and anxiety: Secondary | ICD-10-CM | POA: Diagnosis not present

## 2023-07-26 DIAGNOSIS — F0150 Vascular dementia without behavioral disturbance: Secondary | ICD-10-CM | POA: Diagnosis not present

## 2023-07-26 DIAGNOSIS — R41841 Cognitive communication deficit: Secondary | ICD-10-CM | POA: Diagnosis not present

## 2023-07-26 DIAGNOSIS — F01A0 Vascular dementia, mild, without behavioral disturbance, psychotic disturbance, mood disturbance, and anxiety: Secondary | ICD-10-CM | POA: Diagnosis not present

## 2023-07-26 DIAGNOSIS — M6289 Other specified disorders of muscle: Secondary | ICD-10-CM | POA: Diagnosis not present

## 2023-07-29 DIAGNOSIS — F01A0 Vascular dementia, mild, without behavioral disturbance, psychotic disturbance, mood disturbance, and anxiety: Secondary | ICD-10-CM | POA: Diagnosis not present

## 2023-07-29 DIAGNOSIS — R41841 Cognitive communication deficit: Secondary | ICD-10-CM | POA: Diagnosis not present

## 2023-07-29 DIAGNOSIS — F0150 Vascular dementia without behavioral disturbance: Secondary | ICD-10-CM | POA: Diagnosis not present

## 2023-07-29 DIAGNOSIS — M6289 Other specified disorders of muscle: Secondary | ICD-10-CM | POA: Diagnosis not present

## 2023-07-31 DIAGNOSIS — M6289 Other specified disorders of muscle: Secondary | ICD-10-CM | POA: Diagnosis not present

## 2023-07-31 DIAGNOSIS — F0150 Vascular dementia without behavioral disturbance: Secondary | ICD-10-CM | POA: Diagnosis not present

## 2023-07-31 DIAGNOSIS — R41841 Cognitive communication deficit: Secondary | ICD-10-CM | POA: Diagnosis not present

## 2023-07-31 DIAGNOSIS — F01A0 Vascular dementia, mild, without behavioral disturbance, psychotic disturbance, mood disturbance, and anxiety: Secondary | ICD-10-CM | POA: Diagnosis not present

## 2023-08-01 ENCOUNTER — Encounter: Payer: Self-pay | Admitting: Adult Health

## 2023-08-01 ENCOUNTER — Non-Acute Institutional Stay (SKILLED_NURSING_FACILITY): Payer: Self-pay | Admitting: Adult Health

## 2023-08-01 DIAGNOSIS — I10 Essential (primary) hypertension: Secondary | ICD-10-CM | POA: Diagnosis not present

## 2023-08-01 DIAGNOSIS — E7800 Pure hypercholesterolemia, unspecified: Secondary | ICD-10-CM | POA: Diagnosis not present

## 2023-08-01 DIAGNOSIS — E782 Mixed hyperlipidemia: Secondary | ICD-10-CM | POA: Diagnosis not present

## 2023-08-01 DIAGNOSIS — R0602 Shortness of breath: Secondary | ICD-10-CM | POA: Diagnosis not present

## 2023-08-01 DIAGNOSIS — I5032 Chronic diastolic (congestive) heart failure: Secondary | ICD-10-CM

## 2023-08-01 DIAGNOSIS — I4821 Permanent atrial fibrillation: Secondary | ICD-10-CM

## 2023-08-01 DIAGNOSIS — E039 Hypothyroidism, unspecified: Secondary | ICD-10-CM | POA: Diagnosis not present

## 2023-08-01 DIAGNOSIS — C185 Malignant neoplasm of splenic flexure: Secondary | ICD-10-CM

## 2023-08-01 DIAGNOSIS — F0150 Vascular dementia without behavioral disturbance: Secondary | ICD-10-CM

## 2023-08-01 NOTE — Progress Notes (Signed)
 Location:  Medical illustrator of Service:  SNF (31) Provider:   Janace Mckusick, ANP [facility] [phone]   Marguerite Shiley, MD  Patient Care Team: Marguerite Shiley, MD as PCP - General (Internal Medicine) Croitoru, Karyl Paget, MD as PCP - Cardiology (Cardiology) Merriam Abbey, DO as Consulting Physician (Neurology)  Extended Emergency Contact Information Primary Emergency Contact: [name],[name] Address: [address]          [place], [place] [address] [place] Home Phone: [phone] Mobile Phone: [phone] Relation: Daughter Secondary Emergency Contact: Myrene Ash Address: [address]          [place], [place] [address] [place] Phone: [phone] Relation: Son  Code Status:  DNR Goals of care: Advanced Directive information    05/20/2023   12:11 PM  Advanced Directives  Does Patient Have a Medical Advance Directive? Yes  Type of Advance Directive Healthcare Power of Attorney  Does patient want to make changes to medical advance directive? No - Patient declined  Copy of Healthcare Power of Attorney in Chart? No - copy requested     Chief Complaint  Patient presents with   Medical Management of Chronic Issues    HPI:  Pt is a 89 y.o. female seen today for medical management of chronic diseases.    PMH: hx of colon ca in 2006 with recurrence. Noted by colonoscopy at splenic flexure 02/22/21, bx showed well differentiated adenocarcinoma.  Had 4 cycles of pembrolizumab . Colonoscopy 09/20/21 showed no residual mass.    Also hx of RA on Arava  OAB, TIA, CVA, HTN, ILD, Gerd, GIB, Hypothyroidism, OA, B12 def, mitral and tricuspid valve repair, HLD, anemia, depression, constipation.   RA: seen by Dr Ebbie Goldmann with rheumatology and recommended to restart Enbrel due to arthritis pain. On hold until f/u with oncology   Hypothyroidism: TSH 12.6 04/04/23, TSH 12.1 05/17/23  HLD: :LDL 41 TC 108  04/04/23  Macrocytic Anemia: Hgb 10.3 MCV 102.7 05/17/23   BUN/Cr 27 0.72 K 4.1 NA 134 05/17/23  Seen by cardiology on 12/14/22 with bigeminy. Taken off digoxin . Placed back on eliquis  for CVA risk prevention and metoprolol  increased.  Seen in Nov 2024 sodium tabs stopped, lasix  added to keep weight at 125 lbs due to overload.  Echo repeated.  2D echo 2020: 55-60% severe LAE, mitral valve repair, flattend intraventricular septum c/w right pressure overload. CHF diastolic: lasix  is ordered prn. Has not been used in the month of May. She has some chronic DOE which is unchanged. Noted hx of ILD as well. Not requiring 02.  Wt Readings from Last 3 Encounters:  08/01/23 124 lb 3.2 oz (56.3 kg)  06/04/23 126 lb (57.2 kg)  05/31/23 124 lb 8 oz (56.5 kg)      Dementia: followed by neurology, prior hx of CVA/afib MMSE 26/30 03/2023. She has some short term memory loss. Needs reminders and cuing. Walks with a walker. Has incontinence and requires some assistance with personal care. Verbal and pleasant. Able to answer questions with some issues with accuracy.   Past Medical History:  Diagnosis Date   Allergy    seasonal   Anemia    Anxiety    Arthritis    Back   Asymptomatic menopausal state    Atrial fibrillation (HCC)    Cancer (HCC) 2006   Colon.  Basal Cell Skin cancer- right arm   Cataract    removed bilateral   Chronic atrial fibrillation (HCC)  Colon cancer (HCC)    Constipation due to pain medication therapy    after heart surgery   Deficiency of other specified B group vitamins    Diplopia    Disorientation, unspecified    Dysrhythmia    PAF   GERD (gastroesophageal reflux disease)    Heart murmur    Hyperlipidemia    Hypertension    Hypothyroidism    Malignant neoplasm of colon, unspecified (HCC)    Other specified diseases of blood and blood-forming organs    Other specified disorders of bone density and structure, unspecified forearm    Other thrombophilia (HCC)    per  matrix   Overactive bladder    per Matrix   Personal history of other malignant neoplasm of large intestine    Presbycusis, bilateral    per matrix   RA (rheumatoid arthritis) (HCC)    Restless leg    Seizures (HCC)    after Heart Surgey   Stroke (HCC)    TIA- found by neurologist after    Valgus deformity, not elsewhere classified, right knee    per matrix   Vascular dementia, mild, without behavioral disturbance, psychotic disturbance, mood disturbance, and anxiety (HCC)    per matrix   Past Surgical History:  Procedure Laterality Date   ABDOMINAL HYSTERECTOMY  1970   Partial    COLON RESECTION  2006   cancer   COLON SURGERY     COLONOSCOPY     EYE SURGERY Bilateral    Cataract   MAXIMUM ACCESS (MAS)POSTERIOR LUMBAR INTERBODY FUSION (PLIF) 2 LEVEL N/A 04/12/2015   Procedure: Lumbar Three-Five Decompression, Pedicle Screw Fixation, and Posteriolateral Arthrodesis;  Surgeon: Manya Sells, MD;  Location: [facility];  Service: Neurosurgery;  Laterality: N/A;  L3-4 L4-5 Maximum access posterior lumbar fusion, possible interbodies and resection of synovial cyst at L4-5   MITRAL VALVE REPAIR  01/20/2013   Gore-tex cords to P1, P2, and P3. Magic suture to posterior medial commisure, #30 Physio 1 ring. Done in [place]    TONSILLECTOMY     about 1940   TRICUSPID VALVE SURGERY  01/20/2013   #28 TriAd ring done in [place]     Allergies  Allergen Reactions   Claritin [Loratadine] Other (See Comments)    Listed on MAR Unknown reaction   Codeine Other (See Comments)    "just don't take it well"   Gabapentin  Other (See Comments)    Dizziness    Molds & Smuts Other (See Comments)    Dust.  Reaction is not listed on MAR    Pollen Extract Swelling    Grass   Bee Venom Swelling and Rash   Wasp Venom Swelling and Rash    Outpatient Encounter Medications as of 08/01/2023  Medication Sig   acetaminophen  (TYLENOL ) 500 MG tablet Take 1,000 mg by mouth in the morning and at bedtime.    amoxicillin (AMOXIL) 500 MG capsule Take 1,000 mg by mouth 2 (two) times daily.   apixaban  (ELIQUIS ) 2.5 MG TABS tablet Take 1 tablet (2.5 mg total) by mouth 2 (two) times daily.   atorvastatin  (LIPITOR) 20 MG tablet TAKE 1 TABLET DAILY   Cholecalciferol  (VITAMIN D ) 50 MCG (2000 UT) CAPS Take 2,000 Units by mouth in the morning.   cloNIDine  (CATAPRES ) 0.1 MG tablet Take 1 tablet (0.1 mg total) by mouth daily as needed. If SBP >180 only   furosemide  (LASIX ) 20 MG tablet Take 20 mg daily until weight is 124 pounds. Then take 20 mg daily ONLY  AS NEEDED for weight of 125 pounds or greater.   guaiFENesin -dextromethorphan (ROBITUSSIN DM) 100-10 MG/5ML syrup Take 20 mLs by mouth every 6 (six) hours as needed for cough.   Iron , Ferrous Sulfate , 325 (65 Fe) MG TABS Take 325 mg by mouth daily.   leflunomide  (ARAVA ) 20 MG tablet Take 20 mg by mouth in the morning.   levalbuterol  (XOPENEX ) 0.63 MG/3ML nebulizer solution Take 3 mLs (0.63 mg total) by nebulization every 6 (six) hours as needed for wheezing or shortness of breath.   levothyroxine  (SYNTHROID ) 75 MCG tablet Take 1 tablet (75 mcg total) by mouth daily.   metoprolol  succinate (TOPROL -XL) 100 MG 24 hr tablet Take 1 tablet (100 mg total) by mouth daily. Take 150mg  with or immediately following a meal.   mirtazapine  (REMERON ) 15 MG tablet TAKE 1 TABLET AT BEDTIME   Multiple Vitamin (MULTIVITAMIN ADULT PO) Take 1 tablet by mouth every morning.   Multiple Vitamins-Minerals (PRESERVISION AREDS 2) CAPS Take 1 capsule by mouth in the morning and at bedtime.   omeprazole  (PRILOSEC) 40 MG capsule TAKE 1 CAPSULE DAILY   valsartan  (DIOVAN ) 160 MG tablet Take 160 mg by mouth daily.   Vibegron  (GEMTESA ) 75 MG TABS Take 75 mg by mouth daily.   vitamin B-12 (CYANOCOBALAMIN ) 1000 MCG tablet Take 1,000 mcg by mouth in the morning.   No facility-administered encounter medications on file as of 08/01/2023.    Review of Systems  Constitutional:  Negative for activity  change, appetite change, chills, diaphoresis, fatigue, fever and unexpected weight change.  HENT:  Negative for congestion.   Respiratory:  Positive for shortness of breath (on exertion). Negative for cough and wheezing.   Cardiovascular:  Positive for leg swelling (occasional). Negative for chest pain and palpitations.  Gastrointestinal:  Negative for abdominal distention, abdominal pain, constipation and diarrhea.  Genitourinary:  Negative for difficulty urinating and dysuria.  Musculoskeletal:  Positive for arthralgias and gait problem. Negative for back pain, joint swelling and myalgias.  Neurological:  Negative for dizziness, tremors, seizures, syncope, facial asymmetry, speech difficulty, weakness, light-headedness, numbness and headaches.  Psychiatric/Behavioral:  Negative for agitation, behavioral problems, confusion and sleep disturbance. The patient is not nervous/anxious.        Memory loss    Immunization History  Administered Date(s) Administered   Fluad Quad(high Dose 65+) 01/05/2019, 12/07/2021, 01/15/2023   Fluad Trivalent(High Dose 65+) 01/15/2023   H1N1 04/07/2008   Influenza Whole 12/29/2008, 12/22/2009   Influenza, High Dose Seasonal PF 01/15/2012, 12/23/2014, 01/22/2020   Influenza,inj,Quad PF,6+ Mos 01/22/2014, 12/10/2016, 01/17/2018   Influenza,trivalent, recombinat, inj, PF 01/05/2016   Influenza-Unspecified 04/07/2008, 12/15/2014, 01/13/2021   Moderna Covid-19 Vaccine  Bivalent Booster 74yrs & up 01/29/2022, 01/15/2023   Moderna Sars-Covid-2 Vaccination 04/07/2019, 05/05/2019, 02/09/2020   PFIZER(Purple Top)SARS-COV-2 Vaccination 01/06/2021   PPD Test 07/17/2010, 04/19/2015, 04/25/2015   Pfizer Covid-19 Vaccine Bivalent Booster 40yrs & up 07/19/2022   Pneumococcal Conjugate-13 11/10/2013   Pneumococcal Polysaccharide-23 12/27/2010, 12/05/2016   RSV,unspecified 03/28/2022   Tdap 04/15/2017   Zoster Recombinant(Shingrix) 04/09/2017, 06/18/2017   Pertinent   Health Maintenance Due  Topic Date Due   INFLUENZA VACCINE  10/25/2023   DEXA SCAN  Completed      05/21/2022    1:06 PM 06/06/2022    4:55 PM 07/09/2022    1:44 PM 08/29/2022    3:31 PM 04/18/2023   12:52 PM  Fall Risk  Falls in the past year? 1 1 0 1 0  Was there an injury with Fall? 1 1  0 1 0  Fall Risk Category Calculator 3 3 0 2 0  Patient at Risk for Falls Due to History of fall(s);Impaired balance/gait;Impaired mobility History of fall(s);Impaired balance/gait;Impaired mobility History of fall(s);Impaired balance/gait;Impaired mobility    Fall risk Follow up Falls evaluation completed Falls evaluation completed;Education provided;Falls prevention discussed Falls evaluation completed Falls evaluation completed Falls evaluation completed   Functional Status Survey:    Vitals:   08/01/23 1432  BP: (!) 144/83  Pulse: 69  Resp: 18  Temp: (!) 97.2 F (36.2 C)  SpO2: 96%  Weight: 124 lb 3.2 oz (56.3 kg)   Body mass index is 22.72 kg/m. Physical Exam Vitals and nursing note reviewed.  Constitutional:      General: She is not in acute distress.    Appearance: Normal appearance. She is not diaphoretic.  HENT:     Head: Normocephalic and atraumatic.     Right Ear: Tympanic membrane normal.     Left Ear: Tympanic membrane normal.     Nose: No congestion.     Mouth/Throat:     Mouth: Mucous membranes are moist.     Pharynx: Oropharynx is clear. No oropharyngeal exudate or posterior oropharyngeal erythema.  Eyes:     Conjunctiva/sclera: Conjunctivae normal.     Pupils: Pupils are equal, round, and reactive to light.  Neck:     Thyroid : No thyromegaly.     Vascular: No carotid bruit or JVD.  Cardiovascular:     Rate and Rhythm: Normal rate and regular rhythm.     Heart sounds: Normal heart sounds. No murmur heard. Pulmonary:     Effort: Pulmonary effort is normal. No respiratory distress.     Breath sounds: No stridor. Rales present.  Abdominal:     General: Bowel sounds  are normal. There is no distension.     Palpations: Abdomen is soft.     Tenderness: There is no abdominal tenderness.  Musculoskeletal:     Cervical back: No rigidity. No muscular tenderness.     Right lower leg: No edema.     Left lower leg: No edema.     Comments: Arthritic changes to bilateral hands and wrist.   Lymphadenopathy:     Cervical: No cervical adenopathy.  Skin:    General: Skin is warm and dry.  Neurological:     General: No focal deficit present.     Mental Status: She is alert. Mental status is at baseline.     Cranial Nerves: No cranial nerve deficit.  Psychiatric:        Mood and Affect: Mood normal.     Labs reviewed: Recent Labs    08/06/22 1327 10/18/22 0000 12/31/22 0000 01/07/23 0000 02/05/23 1146  NA 137   < > 135* 137 138  K 4.1   < > 4.0 4.0 4.1  CL 106   < > 104 102 104  CO2 23   < > 22 23* 24  GLUCOSE 106*  --   --   --  96  BUN 19   < > 18 23* 34*  CREATININE 0.76   < > 0.9 0.9 1.15*  CALCIUM  9.4   < > 8.8 8.9 9.8   < > = values in this interval not displayed.   Recent Labs    10/18/22 0000 12/31/22 0000 02/05/23 1146  AST 17 26 18   ALT 16 36* 19  ALKPHOS 90 104 91  BILITOT  --   --  0.8  PROT  --   --  7.1  ALBUMIN 4.4 4.1 4.1   Recent Labs    08/06/22 1327 10/11/22 1405 10/18/22 0000 10/30/22 0000 12/31/22 0000 02/05/23 1146 04/04/23 0000  WBC 7.6 9.9 8.1   < > 6.4 7.6 5.8  NEUTROABS  --  5.7 4.90  --  3.70 4.5  --   HGB 11.7* 11.9* 12.2   < > 10.6* 11.5* 11.0*  HCT 35.2* 35.6* 36   < > 31* 34.6* 33*  MCV 100.9* 102.6*  --   --   --  102.4*  --   PLT 170 193 188   < > 157 163 144*   < > = values in this interval not displayed.   Lab Results  Component Value Date   TSH 12.60 (A) 04/04/2023   Lab Results  Component Value Date   HGBA1C 5.5 01/12/2013   Lab Results  Component Value Date   CHOL 108 04/04/2023   HDL 49 04/04/2023   LDLCALC 41 04/04/2023   TRIG 93 04/04/2023    Significant Diagnostic Results  in last 30 days:  No results found.  Assessment/Plan   1. Acquired hypothyroidism (Primary)  Needs TSH  2. Mixed hyperlipidemia Continues statin  3. Essential hypertension Controlled  4. Permanent atrial fibrillation (HCC) Rate controlled with metoprolol   Off dig On Eliquis  for CVA risk reduction Followed by cardiology  5. Iron  deficiency anemia, unspecified iron  deficiency anemia type Continue iron , followed by heme/onc  6. Vascular dementia without behavioral disturbance (HCC) Doing well in skilled care with support   7. Rheumatoid arthritis involving hand with negative rheumatoid factor, unspecified laterality (HCC) Followed by rheumatology on Arava   Possibly starting back Enbrel  8. Malignant neoplasm of splenic flexure (HCC) In remission Followed by Dr Scherrie Curt  9. SOB (shortness of breath) On exertion Hx of ILD and CHF Using lasix  prn   Labs/tests ordered:  TSH

## 2023-08-02 DIAGNOSIS — R41841 Cognitive communication deficit: Secondary | ICD-10-CM | POA: Diagnosis not present

## 2023-08-02 DIAGNOSIS — M6289 Other specified disorders of muscle: Secondary | ICD-10-CM | POA: Diagnosis not present

## 2023-08-02 DIAGNOSIS — I10 Essential (primary) hypertension: Secondary | ICD-10-CM | POA: Diagnosis not present

## 2023-08-02 DIAGNOSIS — F0150 Vascular dementia without behavioral disturbance: Secondary | ICD-10-CM | POA: Diagnosis not present

## 2023-08-02 DIAGNOSIS — E039 Hypothyroidism, unspecified: Secondary | ICD-10-CM | POA: Diagnosis not present

## 2023-08-02 DIAGNOSIS — E785 Hyperlipidemia, unspecified: Secondary | ICD-10-CM | POA: Diagnosis not present

## 2023-08-02 DIAGNOSIS — F01A0 Vascular dementia, mild, without behavioral disturbance, psychotic disturbance, mood disturbance, and anxiety: Secondary | ICD-10-CM | POA: Diagnosis not present

## 2023-08-02 LAB — LIPID PANEL
Cholesterol: 114 (ref 0–200)
HDL: 53 (ref 35–70)
LDL Cholesterol: 44
Triglycerides: 86 (ref 40–160)

## 2023-08-02 LAB — COMPREHENSIVE METABOLIC PANEL WITH GFR
Calcium: 9.2 (ref 8.7–10.7)
Globulin: 2.1

## 2023-08-02 LAB — BASIC METABOLIC PANEL WITH GFR
BUN: 21 (ref 4–21)
CO2: 23 — AB (ref 13–22)
Chloride: 99 (ref 99–108)
Creatinine: 0.9 (ref 0.5–1.1)
Glucose: 85
Potassium: 4.2 meq/L (ref 3.5–5.1)
Sodium: 133 — AB (ref 137–147)

## 2023-08-02 LAB — HEPATIC FUNCTION PANEL
ALT: 22 U/L (ref 7–35)
AST: 25 (ref 13–35)
Alkaline Phosphatase: 118 (ref 25–125)

## 2023-08-02 LAB — CBC: RBC: 3.19 — AB (ref 3.87–5.11)

## 2023-08-02 LAB — CBC AND DIFFERENTIAL
HCT: 34 — AB (ref 36–46)
Hemoglobin: 11.2 — AB (ref 12.0–16.0)
Platelets: 167 10*3/uL (ref 150–400)
WBC: 5.8

## 2023-08-02 LAB — TSH: TSH: 20.3 — AB (ref 0.41–5.90)

## 2023-08-05 ENCOUNTER — Other Ambulatory Visit: Payer: Self-pay

## 2023-08-05 ENCOUNTER — Other Ambulatory Visit: Payer: Medicare Other

## 2023-08-05 ENCOUNTER — Ambulatory Visit: Payer: Medicare Other | Admitting: Oncology

## 2023-08-05 DIAGNOSIS — R41841 Cognitive communication deficit: Secondary | ICD-10-CM | POA: Diagnosis not present

## 2023-08-05 DIAGNOSIS — M6289 Other specified disorders of muscle: Secondary | ICD-10-CM | POA: Diagnosis not present

## 2023-08-05 DIAGNOSIS — F0150 Vascular dementia without behavioral disturbance: Secondary | ICD-10-CM | POA: Diagnosis not present

## 2023-08-05 DIAGNOSIS — F01A0 Vascular dementia, mild, without behavioral disturbance, psychotic disturbance, mood disturbance, and anxiety: Secondary | ICD-10-CM | POA: Diagnosis not present

## 2023-08-06 DIAGNOSIS — M6289 Other specified disorders of muscle: Secondary | ICD-10-CM | POA: Diagnosis not present

## 2023-08-06 DIAGNOSIS — F01A0 Vascular dementia, mild, without behavioral disturbance, psychotic disturbance, mood disturbance, and anxiety: Secondary | ICD-10-CM | POA: Diagnosis not present

## 2023-08-06 DIAGNOSIS — R41841 Cognitive communication deficit: Secondary | ICD-10-CM | POA: Diagnosis not present

## 2023-08-06 DIAGNOSIS — F0150 Vascular dementia without behavioral disturbance: Secondary | ICD-10-CM | POA: Diagnosis not present

## 2023-08-08 DIAGNOSIS — F01A0 Vascular dementia, mild, without behavioral disturbance, psychotic disturbance, mood disturbance, and anxiety: Secondary | ICD-10-CM | POA: Diagnosis not present

## 2023-08-08 DIAGNOSIS — F0150 Vascular dementia without behavioral disturbance: Secondary | ICD-10-CM | POA: Diagnosis not present

## 2023-08-08 DIAGNOSIS — R41841 Cognitive communication deficit: Secondary | ICD-10-CM | POA: Diagnosis not present

## 2023-08-08 DIAGNOSIS — M6289 Other specified disorders of muscle: Secondary | ICD-10-CM | POA: Diagnosis not present

## 2023-08-13 ENCOUNTER — Inpatient Hospital Stay: Attending: Oncology

## 2023-08-13 ENCOUNTER — Encounter: Payer: Self-pay | Admitting: Oncology

## 2023-08-13 ENCOUNTER — Inpatient Hospital Stay (HOSPITAL_BASED_OUTPATIENT_CLINIC_OR_DEPARTMENT_OTHER): Admitting: Oncology

## 2023-08-13 ENCOUNTER — Ambulatory Visit: Payer: Self-pay | Admitting: Oncology

## 2023-08-13 VITALS — BP 142/68 | HR 81 | Temp 98.1°F | Resp 18 | Ht 62.0 in | Wt 127.6 lb

## 2023-08-13 DIAGNOSIS — I4891 Unspecified atrial fibrillation: Secondary | ICD-10-CM | POA: Diagnosis not present

## 2023-08-13 DIAGNOSIS — Z7901 Long term (current) use of anticoagulants: Secondary | ICD-10-CM | POA: Insufficient documentation

## 2023-08-13 DIAGNOSIS — Z9049 Acquired absence of other specified parts of digestive tract: Secondary | ICD-10-CM | POA: Diagnosis not present

## 2023-08-13 DIAGNOSIS — D539 Nutritional anemia, unspecified: Secondary | ICD-10-CM | POA: Diagnosis not present

## 2023-08-13 DIAGNOSIS — Z9221 Personal history of antineoplastic chemotherapy: Secondary | ICD-10-CM | POA: Diagnosis not present

## 2023-08-13 DIAGNOSIS — Z8673 Personal history of transient ischemic attack (TIA), and cerebral infarction without residual deficits: Secondary | ICD-10-CM | POA: Insufficient documentation

## 2023-08-13 DIAGNOSIS — F0150 Vascular dementia without behavioral disturbance: Secondary | ICD-10-CM | POA: Diagnosis not present

## 2023-08-13 DIAGNOSIS — R41841 Cognitive communication deficit: Secondary | ICD-10-CM | POA: Diagnosis not present

## 2023-08-13 DIAGNOSIS — M069 Rheumatoid arthritis, unspecified: Secondary | ICD-10-CM | POA: Diagnosis not present

## 2023-08-13 DIAGNOSIS — C185 Malignant neoplasm of splenic flexure: Secondary | ICD-10-CM

## 2023-08-13 DIAGNOSIS — F01A0 Vascular dementia, mild, without behavioral disturbance, psychotic disturbance, mood disturbance, and anxiety: Secondary | ICD-10-CM | POA: Diagnosis not present

## 2023-08-13 DIAGNOSIS — M6289 Other specified disorders of muscle: Secondary | ICD-10-CM | POA: Diagnosis not present

## 2023-08-13 LAB — CBC WITH DIFFERENTIAL (CANCER CENTER ONLY)
Abs Immature Granulocytes: 0.03 10*3/uL (ref 0.00–0.07)
Basophils Absolute: 0.1 10*3/uL (ref 0.0–0.1)
Basophils Relative: 1 %
Eosinophils Absolute: 0.1 10*3/uL (ref 0.0–0.5)
Eosinophils Relative: 2 %
HCT: 35 % — ABNORMAL LOW (ref 36.0–46.0)
Hemoglobin: 11.8 g/dL — ABNORMAL LOW (ref 12.0–15.0)
Immature Granulocytes: 0 %
Lymphocytes Relative: 26 %
Lymphs Abs: 2 10*3/uL (ref 0.7–4.0)
MCH: 34.9 pg — ABNORMAL HIGH (ref 26.0–34.0)
MCHC: 33.7 g/dL (ref 30.0–36.0)
MCV: 103.6 fL — ABNORMAL HIGH (ref 80.0–100.0)
Monocytes Absolute: 1.1 10*3/uL — ABNORMAL HIGH (ref 0.1–1.0)
Monocytes Relative: 14 %
Neutro Abs: 4.3 10*3/uL (ref 1.7–7.7)
Neutrophils Relative %: 57 %
Platelet Count: 177 10*3/uL (ref 150–400)
RBC: 3.38 MIL/uL — ABNORMAL LOW (ref 3.87–5.11)
RDW: 14.7 % (ref 11.5–15.5)
WBC Count: 7.6 10*3/uL (ref 4.0–10.5)
nRBC: 0 % (ref 0.0–0.2)

## 2023-08-13 LAB — CEA (ACCESS): CEA (CHCC): 6.51 ng/mL — ABNORMAL HIGH (ref 0.00–5.00)

## 2023-08-13 NOTE — Progress Notes (Signed)
  [facility] OFFICE PROGRESS NOTE   Diagnosis: Colon cancer  INTERVAL HISTORY:   [name] returns as scheduled.  She is here with her daughter.  She has resumed Enbrel for treatment of arthritis.  She has limited motion in the fingers.  No pain.  No difficulty with bowel function.  No bleeding.  Good appetite.  She now resides on the skilled nursing unit at [facility].  Objective:  Vital signs in last 24 hours:  Blood pressure (!) 142/68, pulse 81, temperature 98.1 F (36.7 C), temperature source Temporal, resp. rate 18, height 5\' 2"  (1.575 m), weight 127 lb 9.6 oz (57.9 kg), SpO2 98%.   Lymphatics: No cervical, supraclavicular, axillary, or inguinal nodes Resp: Scattered coarse inspiratory rhonchi to posterior chest bilaterally Cardio: Regular rate and rhythm GI: No mass, nontender, no hepatosplenomegaly Vascular: No leg edema Musculoskeletal: Joint deformities of the hands, no erythema or edema   Lab Results:  Lab Results  Component Value Date   WBC 7.6 08/13/2023   HGB 11.8 (L) 08/13/2023   HCT 35.0 (L) 08/13/2023   MCV 103.6 (H) 08/13/2023   PLT 177 08/13/2023   NEUTROABS 4.3 08/13/2023    CMP  Lab Results  Component Value Date   NA 138 02/05/2023   K 4.1 02/05/2023   CL 104 02/05/2023   CO2 24 02/05/2023   GLUCOSE 96 02/05/2023   BUN 34 (H) 02/05/2023   CREATININE 1.15 (H) 02/05/2023   CALCIUM  9.8 02/05/2023   PROT 7.1 02/05/2023   ALBUMIN 4.1 02/05/2023   AST 18 02/05/2023   ALT 19 02/05/2023   ALKPHOS 91 02/05/2023   BILITOT 0.8 02/05/2023   GFRNONAA 46 (L) 02/05/2023   GFRAA 79.35 02/02/2021    Lab Results  Component Value Date   CEA1 8.2 (H) 03/07/2022   CEA 5.45 (H) 02/05/2023     Medications: I have reviewed the patient's current medications.   Assessment/Plan: Colon cancer-splenic flexure mass on colonoscopy 02/22/2021 Biopsy 02/22/2021-invasive well differentiated adenocarcinoma, loss of MLH1 and PMS2 expression, MSI  high, MLH1 hyper methylation present CTs 03/31/2021-no evidence of metastatic disease, possible mass at the splenic flexure, 4 mm left upper lung nodule, bilateral lung scarring Cycle 1 pembrolizumab  06/13/2021 Cycle 2 pembrolizumab  07/04/2021 Cycle 3 pembrolizumab  07/25/2021 Cycle 4 pembrolizumab  08/22/2021 Colonoscopy 09/20/2021-no residual mass at the splenic flexure/descending colon CT abdomen/pelvis 03/06/2022-diverticulosis without evidence of acute diverticulitis, partial colectomy Macrocytic anemia Sessile serrated polyp with focus of high-grade dysplasia on colonoscopy 02/22/2021 Colon cancer 2006, status post a right colectomy-"node positive "cancer per patient, received adjuvant chemotherapy Atrial fibrillation, maintained on anticoagulation History of a CVA and TIA Mild cognitive impairment Rheumatoid arthritis Hypothyroidism 10.  Chronic lung disease? 11.  Fall with head injury 10/22/2021 Scalp hematoma with bleeding 12/06/2021 12.  01/30/2022-displaced distal right fibular fracture, small displaced posterior malleolar fracture 13.  Admission 03/07/2022 with acute GI bleeding 14.  Fall with right distal fibular/malleolar fracture November 2023        Disposition: Ms.Keleher is an clinical remission from colon cancer.  She has stable mild macrocytic anemia, potentially related to myelodysplasia.  The plan is to continue observation.  She is followed by Dr. Ebbie Goldmann for rheumatoid arthritis and Dr. Venice Gillis for internal medicine care.  She will return for an office visit and CEA in 6 months.  I am available to see her in the interim as needed.  Coni Deep, MD  08/13/2023  12:15 PM

## 2023-08-14 ENCOUNTER — Telehealth: Payer: Self-pay | Admitting: Oncology

## 2023-08-14 NOTE — Telephone Encounter (Signed)
-----   Message from Coni Deep sent at 08/13/2023  4:56 PM EDT ----- Please call daughter, the CEA remains mildly elevated and has not changed significantly over the past 1.5 years, continue observation, call for new symptoms, follow-up as scheduled

## 2023-08-14 NOTE — Telephone Encounter (Signed)
 Patient has been scheduled for follow-up visit per 08/14/23 LOS. PT daughter noted next appt information on calendar.

## 2023-08-14 NOTE — Telephone Encounter (Signed)
 Patient gave verbal understanding had no further question

## 2023-08-16 DIAGNOSIS — M6289 Other specified disorders of muscle: Secondary | ICD-10-CM | POA: Diagnosis not present

## 2023-08-16 DIAGNOSIS — F01A0 Vascular dementia, mild, without behavioral disturbance, psychotic disturbance, mood disturbance, and anxiety: Secondary | ICD-10-CM | POA: Diagnosis not present

## 2023-08-16 DIAGNOSIS — R41841 Cognitive communication deficit: Secondary | ICD-10-CM | POA: Diagnosis not present

## 2023-08-16 DIAGNOSIS — F0150 Vascular dementia without behavioral disturbance: Secondary | ICD-10-CM | POA: Diagnosis not present

## 2023-08-19 DIAGNOSIS — M6289 Other specified disorders of muscle: Secondary | ICD-10-CM | POA: Diagnosis not present

## 2023-08-19 DIAGNOSIS — R41841 Cognitive communication deficit: Secondary | ICD-10-CM | POA: Diagnosis not present

## 2023-08-19 DIAGNOSIS — F0150 Vascular dementia without behavioral disturbance: Secondary | ICD-10-CM | POA: Diagnosis not present

## 2023-08-19 DIAGNOSIS — F01A0 Vascular dementia, mild, without behavioral disturbance, psychotic disturbance, mood disturbance, and anxiety: Secondary | ICD-10-CM | POA: Diagnosis not present

## 2023-08-22 ENCOUNTER — Encounter: Payer: Self-pay | Admitting: Adult Health

## 2023-08-22 NOTE — Progress Notes (Unsigned)
 [facility] Room Number: 129-A Place of Service:  SNF [phone]) Provider:  Raylene Calamity, NP  PCP: Marguerite Shiley, MD  Patient Care Team: Marguerite Shiley, MD as PCP - General (Internal Medicine) Croitoru, Karyl Paget, MD as PCP - Cardiology (Cardiology) Merriam Abbey, DO as Consulting Physician (Neurology)  Extended Emergency Contact Information Primary Emergency Contact: [name],[name] Address: [address]          [place], [place] [address] [place] Home Phone: [phone] Mobile Phone: [phone] Relation: Daughter Secondary Emergency Contact: Myrene Ash Address: [address]          [place], [place] [address] [place] Phone: [phone] Relation: Son  Code Status:  DNR Goals of care: Advanced Directive information    08/22/2023   11:34 AM  Advanced Directives  Does Patient Have a Medical Advance Directive? Yes  Type of Advance Directive Living will;Healthcare Power of [place];Out of facility DNR (pink MOST or yellow form)  Does patient want to make changes to medical advance directive? No - Patient declined  Copy of Healthcare Power of Attorney in Chart? Yes - validated most recent copy scanned in chart (See row information)     Chief Complaint  Patient presents with   Medical Management of Chronic Issues    Routine visit Discuss the need for AWV.    HPI:  Kathy Velez is a 89 y.o. female seen today for medical management of chronic diseases.     Past Medical History:  Diagnosis Date   Allergy    seasonal   Anemia    Anxiety    Arthritis    Back   Asymptomatic menopausal state    Atrial fibrillation (HCC)    Cancer (HCC) 2006   Colon.  Basal Cell Skin cancer- right arm   Cataract    removed bilateral   Chronic atrial fibrillation (HCC)    Colon cancer (HCC)    Constipation due to pain medication therapy    after heart surgery   Deficiency of other specified B group vitamins     Diplopia    Disorientation, unspecified    Dysrhythmia    PAF   GERD (gastroesophageal reflux disease)    Heart murmur    Hyperlipidemia    Hypertension    Hypothyroidism    Malignant neoplasm of colon, unspecified (HCC)    Other specified diseases of blood and blood-forming organs    Other specified disorders of bone density and structure, unspecified forearm    Other thrombophilia (HCC)    per matrix   Overactive bladder    per Matrix   Personal history of other malignant neoplasm of large intestine    Presbycusis, bilateral    per matrix   RA (rheumatoid arthritis) (HCC)    Restless leg    Seizures (HCC)    after Heart Surgey   Stroke (HCC)    TIA- found by neurologist after    Valgus deformity, not elsewhere classified, right knee    per matrix   Vascular dementia, mild, without behavioral disturbance, psychotic disturbance, mood disturbance, and anxiety (HCC)    per matrix   Past Surgical History:  Procedure Laterality Date   ABDOMINAL HYSTERECTOMY  1970   Partial    COLON RESECTION  2006   cancer   COLON SURGERY     COLONOSCOPY     EYE SURGERY Bilateral    Cataract   MAXIMUM ACCESS (MAS)POSTERIOR LUMBAR INTERBODY FUSION (PLIF)  2 LEVEL N/A 04/12/2015   Procedure: Lumbar Three-Five Decompression, Pedicle Screw Fixation, and Posteriolateral Arthrodesis;  Surgeon: Manya Sells, MD;  Location: [facility];  Service: Neurosurgery;  Laterality: N/A;  L3-4 L4-5 Maximum access posterior lumbar fusion, possible interbodies and resection of synovial cyst at L4-5   MITRAL VALVE REPAIR  01/20/2013   Gore-tex cords to P1, P2, and P3. Magic suture to posterior medial commisure, #30 Physio 1 ring. Done in [place]    TONSILLECTOMY     about 1940   TRICUSPID VALVE SURGERY  01/20/2013   #28 TriAd ring done in [place]     Allergies  Allergen Reactions   Claritin [Loratadine] Other (See Comments)    Listed on MAR Unknown reaction   Codeine Other (See Comments)    "just don't  take it well"   Gabapentin  Other (See Comments)    Dizziness    Molds & Smuts Other (See Comments)    Dust.  Reaction is not listed on MAR    Pollen Extract Swelling    Grass   Bee Venom Swelling and Rash   Wasp Venom Swelling and Rash    Allergies as of 08/22/2023       Reactions   Claritin [loratadine] Other (See Comments)   Listed on MAR Unknown reaction   Codeine Other (See Comments)   "just don't take it well"   Gabapentin  Other (See Comments)   Dizziness    Molds & Smuts Other (See Comments)   Dust.  Reaction is not listed on MAR    Pollen Extract Swelling   Grass   Bee Venom Swelling, Rash   Wasp Venom Swelling, Rash        Medication List        Accurate as of Aug 22, 2023 11:34 AM. If you have any questions, ask your nurse or doctor.          acetaminophen  500 MG tablet Commonly known as: TYLENOL  Take 1,000 mg by mouth in the morning and at bedtime.   amoxicillin 500 MG capsule Commonly known as: AMOXIL Take 1,000 mg by mouth as needed.   apixaban  2.5 MG Tabs tablet Commonly known as: ELIQUIS  Take 1 tablet (2.5 mg total) by mouth 2 (two) times daily.   atorvastatin  20 MG tablet Commonly known as: LIPITOR TAKE 1 TABLET DAILY   cloNIDine  0.1 MG tablet Commonly known as: CATAPRES  Take 1 tablet (0.1 mg total) by mouth daily as needed. If SBP >180 only   cyanocobalamin  1000 MCG tablet Commonly known as: VITAMIN B12 Take 1,000 mcg by mouth in the morning.   Enbrel SureClick 50 MG/ML injection Generic drug: etanercept Inject 50 mg into the skin once a week.   furosemide  20 MG tablet Commonly known as: LASIX  Take 20 mg daily until weight is 124 pounds. Then take 20 mg daily ONLY AS NEEDED for weight of 125 pounds or greater.   Gemtesa  75 MG Tabs Generic drug: Vibegron  Take 75 mg by mouth at bedtime.   guaiFENesin -dextromethorphan 100-10 MG/5ML syrup Commonly known as: ROBITUSSIN DM Take 20 mLs by mouth every 6 (six) hours as needed for  cough.   Iron  (Ferrous Sulfate ) 325 (65 Fe) MG Tabs Take 325 mg by mouth daily.   leflunomide  20 MG tablet Commonly known as: ARAVA  Take 20 mg by mouth in the morning.   levalbuterol  0.63 MG/3ML nebulizer solution Commonly known as: Xopenex  Take 3 mLs (0.63 mg total) by nebulization every 6 (six) hours as needed for wheezing or shortness  of breath.   levothyroxine  88 MCG tablet Commonly known as: SYNTHROID  Take 88 mcg by mouth daily before breakfast.   METOPROLOL  SUCCINATE PO Take 150 mg by mouth in the morning.   metoprolol  succinate 100 MG 24 hr tablet Commonly known as: TOPROL -XL Take 1 tablet (100 mg total) by mouth daily. Take 150mg  with or immediately following a meal.   mirtazapine  15 MG tablet Commonly known as: REMERON  TAKE 1 TABLET AT BEDTIME   MULTIVITAMIN ADULT PO Take 1 tablet by mouth every morning.   omeprazole  40 MG capsule Commonly known as: PRILOSEC TAKE 1 CAPSULE DAILY   PreserVision AREDS 2 Caps Take 1 capsule by mouth in the morning and at bedtime.   valsartan  160 MG tablet Commonly known as: DIOVAN  Take 160 mg by mouth daily.   Vitamin D  50 MCG (2000 UT) Caps Take 2,000 Units by mouth in the morning.        Review of Systems  Immunization History  Administered Date(s) Administered   Fluad Quad(high Dose 65+) 01/05/2019, 12/07/2021, 01/15/2023   Fluad Trivalent(High Dose 65+) 01/15/2023   H1N1 04/07/2008   Influenza Whole 12/29/2008, 12/22/2009   Influenza, High Dose Seasonal PF 01/15/2012, 12/23/2014, 01/22/2020   Influenza,inj,Quad PF,6+ Mos 01/22/2014, 12/10/2016, 01/17/2018   Influenza,trivalent, recombinat, inj, PF 01/05/2016   Influenza-Unspecified 04/07/2008, 12/15/2014, 01/13/2021   Moderna Covid-19 Vaccine  Bivalent Booster 43yrs & up 01/29/2022, 01/15/2023   Moderna Sars-Covid-2 Vaccination 04/07/2019, 05/05/2019, 02/09/2020   PFIZER(Purple Top)SARS-COV-2 Vaccination 01/06/2021   PPD Test 07/17/2010, 04/19/2015, 04/25/2015    Pfizer Covid-19 Vaccine Bivalent Booster 2yrs & up 07/19/2022   Pneumococcal Conjugate-13 11/10/2013   Pneumococcal Polysaccharide-23 12/27/2010, 12/05/2016   RSV,unspecified 03/28/2022   Tdap 04/15/2017   Zoster Recombinant(Shingrix) 04/09/2017, 06/18/2017   Pertinent  Health Maintenance Due  Topic Date Due   INFLUENZA VACCINE  10/25/2023   DEXA SCAN  Completed      05/21/2022    1:06 PM 06/06/2022    4:55 PM 07/09/2022    1:44 PM 08/29/2022    3:31 PM 04/18/2023   12:52 PM  Fall Risk  Falls in the past year? 1 1 0 1 0  Was there an injury with Fall? 1 1 0 1 0  Fall Risk Category Calculator 3 3 0 2 0  Patient at Risk for Falls Due to History of fall(s);Impaired balance/gait;Impaired mobility History of fall(s);Impaired balance/gait;Impaired mobility History of fall(s);Impaired balance/gait;Impaired mobility    Fall risk Follow up Falls evaluation completed Falls evaluation completed;Education provided;Falls prevention discussed Falls evaluation completed Falls evaluation completed Falls evaluation completed   Functional Status Survey:    Vitals:   08/22/23 1050  BP: (!) 151/91  Pulse: 85  Resp: 18  Temp: (!) 97 F (36.1 C)  SpO2: 95%  Weight: 124 lb (56.2 kg)  Height: 5\' 2"  (1.575 m)   Body mass index is 22.68 kg/m. Physical Exam  Labs reviewed: Recent Labs    01/07/23 0000 02/05/23 1146 08/02/23 0000  NA 137 138 133*  K 4.0 4.1 4.2  CL 102 104 99  CO2 23* 24 23*  GLUCOSE  --  96  --   BUN 23* 34* 21  CREATININE 0.9 1.15* 0.9  CALCIUM  8.9 9.8 9.2   Recent Labs    10/18/22 0000 12/31/22 0000 02/05/23 1146 08/02/23 0000  AST 17 26 18 25   ALT 16 36* 19 22  ALKPHOS 90 104 91 118  BILITOT  --   --  0.8  --   PROT  --   --  7.1  --   ALBUMIN 4.4 4.1 4.1  --    Recent Labs    10/11/22 1405 10/18/22 0000 12/31/22 0000 02/05/23 1146 04/04/23 0000 08/02/23 0000 08/13/23 1130  WBC 9.9   < > 6.4 7.6 5.8 5.8 7.6  NEUTROABS 5.7   < > 3.70 4.5  --    --  4.3  HGB 11.9*   < > 10.6* 11.5* 11.0* 11.2* 11.8*  HCT 35.6*   < > 31* 34.6* 33* 34* 35.0*  MCV 102.6*  --   --  102.4*  --   --  103.6*  PLT 193   < > 157 163 144* 167 177   < > = values in this interval not displayed.   Lab Results  Component Value Date   TSH 20.30 (A) 08/02/2023   Lab Results  Component Value Date   HGBA1C 5.5 01/12/2013   Lab Results  Component Value Date   CHOL 114 08/02/2023   HDL 53 08/02/2023   LDLCALC 44 08/02/2023   TRIG 86 08/02/2023    Significant Diagnostic Results in last 30 days:  No results found.  Assessment/Plan There are no diagnoses linked to this encounter.   Family/ staff Communication:   Labs/tests ordered:     This encounter was created in error - please disregard.

## 2023-08-23 ENCOUNTER — Encounter: Payer: Self-pay | Admitting: Adult Health

## 2023-08-23 ENCOUNTER — Non-Acute Institutional Stay (SKILLED_NURSING_FACILITY): Payer: Self-pay | Admitting: Adult Health

## 2023-08-23 DIAGNOSIS — I5032 Chronic diastolic (congestive) heart failure: Secondary | ICD-10-CM | POA: Diagnosis not present

## 2023-08-23 DIAGNOSIS — R41841 Cognitive communication deficit: Secondary | ICD-10-CM | POA: Diagnosis not present

## 2023-08-23 DIAGNOSIS — F01A0 Vascular dementia, mild, without behavioral disturbance, psychotic disturbance, mood disturbance, and anxiety: Secondary | ICD-10-CM | POA: Diagnosis not present

## 2023-08-23 DIAGNOSIS — F0150 Vascular dementia without behavioral disturbance: Secondary | ICD-10-CM | POA: Diagnosis not present

## 2023-08-23 DIAGNOSIS — I10 Essential (primary) hypertension: Secondary | ICD-10-CM | POA: Diagnosis not present

## 2023-08-23 DIAGNOSIS — R0609 Other forms of dyspnea: Secondary | ICD-10-CM | POA: Diagnosis not present

## 2023-08-23 DIAGNOSIS — I4821 Permanent atrial fibrillation: Secondary | ICD-10-CM | POA: Diagnosis not present

## 2023-08-23 DIAGNOSIS — M6289 Other specified disorders of muscle: Secondary | ICD-10-CM | POA: Diagnosis not present

## 2023-08-23 DIAGNOSIS — I517 Cardiomegaly: Secondary | ICD-10-CM | POA: Diagnosis not present

## 2023-08-23 DIAGNOSIS — R0981 Nasal congestion: Secondary | ICD-10-CM | POA: Diagnosis not present

## 2023-08-23 DIAGNOSIS — J984 Other disorders of lung: Secondary | ICD-10-CM | POA: Diagnosis not present

## 2023-08-23 DIAGNOSIS — R0602 Shortness of breath: Secondary | ICD-10-CM | POA: Diagnosis not present

## 2023-08-23 MED ORDER — LEVALBUTEROL HCL 0.63 MG/3ML IN NEBU: 0.6300 mg | INHALATION_SOLUTION | Freq: Four times a day (QID) | RESPIRATORY_TRACT | 0 refills | Status: AC | PRN

## 2023-08-23 NOTE — Progress Notes (Addendum)
 Location:  Medical illustrator of Service:  SNF (31) Provider:   Janace Mckusick, ANP [facility] [phone]   Marguerite Shiley, MD  Patient Care Team: Marguerite Shiley, MD as PCP - General (Internal Medicine) Croitoru, Karyl Paget, MD as PCP - Cardiology (Cardiology) Merriam Abbey, DO as Consulting Physician (Neurology)  Extended Emergency Contact Information Primary Emergency Contact: [name],[name] Address: [address]          [place], [place] [address] [place] Home Phone: [phone] Mobile Phone: [phone] Relation: Daughter Secondary Emergency Contact: Myrene Ash Address: [address]          [place], [place] [address] [place] Phone: [phone] Relation: Son  Code Status:  DNR Goals of care: Advanced Directive information    08/22/2023   11:34 AM  Advanced Directives  Does Patient Have a Medical Advance Directive? Yes  Type of Advance Directive Living will;Healthcare Power of [place];Out of facility DNR (pink MOST or yellow form)  Does patient want to make changes to medical advance directive? No - Patient declined  Copy of Healthcare Power of Attorney in Chart? Yes - validated most recent copy scanned in chart (See row information)     Chief Complaint  Patient presents with   Acute Visit    DOE    HPI:  The patient is an 89 year old who presents with worsening dyspnea on exertion.  She has experienced chronic dyspnea on exertion, which has worsened over the past week. This shortness of breath occurs during activities of daily living, although she does not perceive a change in her condition. However, others have noted a worsening. There is no fever, cough, or purulent sputum.  Her medical history includes interstitial lung disease, rheumatoid arthritis, atrial fibrillation, dementia, hypertension, chronic diastolic heart failure, allergic rhinitis, and colon cancer. She underwent  immunotherapy for a malignant neoplasm of the splenic flexure. A CA level drawn on May 20th was 6.5, showing only a slight increase from six months ago.  Her medication regimen includes Synthroid , which was adjusted to 88 mcg on May 9th due to an elevated TSH of 20.3.   In February 2025, a chest x-ray showed no acute abnormality. A CT scan in January of 2023 noted biapical pleural parenchymal thickening, scarring, and a 4 mm left upper lobe nodule. An echocardiogram in 2024 showed no significant change, with an EF of 55-60% and severely dilated atria. She has moderately elevated pulmonary artery pressure and has undergone mitral valve repair.  Weight stable No increase in edema.   She resides in skilled care and has never smoked or used smokeless tobacco.  Past Medical History:  Diagnosis Date   Allergy    seasonal   Anemia    Anxiety    Arthritis    Back   Asymptomatic menopausal state    Atrial fibrillation (HCC)    Cancer (HCC) 2006   Colon.  Basal Cell Skin cancer- right arm   Cataract    removed bilateral   Chronic atrial fibrillation (HCC)    Colon cancer (HCC)    Constipation due to pain medication therapy    after heart surgery   Deficiency of other specified B group vitamins    Diplopia    Disorientation, unspecified    Dysrhythmia    PAF   GERD (gastroesophageal reflux disease)    Heart murmur    Hyperlipidemia    Hypertension    Hypothyroidism  Malignant neoplasm of colon, unspecified (HCC)    Other specified diseases of blood and blood-forming organs    Other specified disorders of bone density and structure, unspecified forearm    Other thrombophilia (HCC)    per matrix   Overactive bladder    per Matrix   Personal history of other malignant neoplasm of large intestine    Presbycusis, bilateral    per matrix   RA (rheumatoid arthritis) (HCC)    Restless leg    Seizures (HCC)    after Heart Surgey   Stroke (HCC)    TIA- found by neurologist after     Valgus deformity, not elsewhere classified, right knee    per matrix   Vascular dementia, mild, without behavioral disturbance, psychotic disturbance, mood disturbance, and anxiety (HCC)    per matrix   Past Surgical History:  Procedure Laterality Date   ABDOMINAL HYSTERECTOMY  1970   Partial    COLON RESECTION  2006   cancer   COLON SURGERY     COLONOSCOPY     EYE SURGERY Bilateral    Cataract   MAXIMUM ACCESS (MAS)POSTERIOR LUMBAR INTERBODY FUSION (PLIF) 2 LEVEL N/A 04/12/2015   Procedure: Lumbar Three-Five Decompression, Pedicle Screw Fixation, and Posteriolateral Arthrodesis;  Surgeon: Manya Sells, MD;  Location: [facility];  Service: Neurosurgery;  Laterality: N/A;  L3-4 L4-5 Maximum access posterior lumbar fusion, possible interbodies and resection of synovial cyst at L4-5   MITRAL VALVE REPAIR  01/20/2013   Gore-tex cords to P1, P2, and P3. Magic suture to posterior medial commisure, #30 Physio 1 ring. Done in [place]    TONSILLECTOMY     about 1940   TRICUSPID VALVE SURGERY  01/20/2013   #28 TriAd ring done in [place]     Allergies  Allergen Reactions   Claritin [Loratadine] Other (See Comments)    Listed on MAR Unknown reaction   Codeine Other (See Comments)    "just don't take it well"   Gabapentin  Other (See Comments)    Dizziness    Molds & Smuts Other (See Comments)    Dust.  Reaction is not listed on MAR    Pollen Extract Swelling    Grass   Bee Venom Swelling and Rash   Wasp Venom Swelling and Rash    Outpatient Encounter Medications as of 08/23/2023  Medication Sig   acetaminophen  (TYLENOL ) 500 MG tablet Take 1,000 mg by mouth in the morning and at bedtime.   amoxicillin (AMOXIL) 500 MG capsule Take 1,000 mg by mouth as needed.   apixaban  (ELIQUIS ) 2.5 MG TABS tablet Take 1 tablet (2.5 mg total) by mouth 2 (two) times daily.   atorvastatin  (LIPITOR) 20 MG tablet TAKE 1 TABLET DAILY   Cholecalciferol  (VITAMIN D ) 50 MCG (2000 UT) CAPS Take 2,000 Units  by mouth in the morning.   cloNIDine  (CATAPRES ) 0.1 MG tablet Take 1 tablet (0.1 mg total) by mouth daily as needed. If SBP >180 only   ENBREL SURECLICK 50 MG/ML injection Inject 50 mg into the skin once a week. (Patient not taking: Reported on 08/22/2023)   furosemide  (LASIX ) 20 MG tablet Take 20 mg daily until weight is 124 pounds. Then take 20 mg daily ONLY AS NEEDED for weight of 125 pounds or greater.   guaiFENesin -dextromethorphan (ROBITUSSIN DM) 100-10 MG/5ML syrup Take 20 mLs by mouth every 6 (six) hours as needed for cough. (Patient not taking: Reported on 08/22/2023)   Iron , Ferrous Sulfate , 325 (65 Fe) MG TABS Take 325 mg by  mouth daily.   leflunomide  (ARAVA ) 20 MG tablet Take 20 mg by mouth in the morning.   levalbuterol  (XOPENEX ) 0.63 MG/3ML nebulizer solution Take 3 mLs (0.63 mg total) by nebulization every 6 (six) hours as needed for wheezing or shortness of breath. (Patient not taking: Reported on 08/22/2023)   levothyroxine  (SYNTHROID ) 88 MCG tablet Take 88 mcg by mouth daily before breakfast.   metoprolol  succinate (TOPROL -XL) 100 MG 24 hr tablet Take 1 tablet (100 mg total) by mouth daily. Take 150mg  with or immediately following a meal.   METOPROLOL  SUCCINATE PO Take 150 mg by mouth in the morning.   mirtazapine  (REMERON ) 15 MG tablet TAKE 1 TABLET AT BEDTIME   Multiple Vitamin (MULTIVITAMIN ADULT PO) Take 1 tablet by mouth every morning.   Multiple Vitamins-Minerals (PRESERVISION AREDS 2) CAPS Take 1 capsule by mouth in the morning and at bedtime.   omeprazole  (PRILOSEC) 40 MG capsule TAKE 1 CAPSULE DAILY   valsartan  (DIOVAN ) 160 MG tablet Take 160 mg by mouth daily.   Vibegron  (GEMTESA ) 75 MG TABS Take 75 mg by mouth at bedtime.   vitamin B-12 (CYANOCOBALAMIN ) 1000 MCG tablet Take 1,000 mcg by mouth in the morning.   No facility-administered encounter medications on file as of 08/23/2023.    Review of Systems  Constitutional:  Negative for activity change, appetite change,  chills, diaphoresis, fatigue, fever and unexpected weight change.  HENT:  Positive for congestion.   Respiratory:  Positive for shortness of breath (on exertion). Negative for cough and wheezing.   Cardiovascular:  Positive for leg swelling. Negative for chest pain and palpitations.  Gastrointestinal:  Negative for abdominal distention, abdominal pain, constipation and diarrhea.  Genitourinary:  Negative for difficulty urinating and dysuria.  Musculoskeletal:  Positive for arthralgias and gait problem. Negative for back pain and myalgias.  Neurological:  Negative for dizziness, tremors, seizures, syncope, facial asymmetry, speech difficulty, weakness, light-headedness, numbness and headaches.  Psychiatric/Behavioral:  Negative for agitation, behavioral problems and confusion.     Immunization History  Administered Date(s) Administered   Fluad Quad(high Dose 65+) 01/05/2019, 12/07/2021, 01/15/2023   Fluad Trivalent(High Dose 65+) 01/15/2023   H1N1 04/07/2008   Influenza Whole 12/29/2008, 12/22/2009   Influenza, High Dose Seasonal PF 01/15/2012, 12/23/2014, 01/22/2020   Influenza,inj,Quad PF,6+ Mos 01/22/2014, 12/10/2016, 01/17/2018   Influenza,trivalent, recombinat, inj, PF 01/05/2016   Influenza-Unspecified 04/07/2008, 12/15/2014, 01/13/2021   Moderna Covid-19 Vaccine  Bivalent Booster 31yrs & up 01/29/2022, 01/15/2023   Moderna Sars-Covid-2 Vaccination 04/07/2019, 05/05/2019, 02/09/2020   PFIZER(Purple Top)SARS-COV-2 Vaccination 01/06/2021   PPD Test 07/17/2010, 04/19/2015, 04/25/2015   Pfizer Covid-19 Vaccine Bivalent Booster 42yrs & up 07/19/2022   Pneumococcal Conjugate-13 11/10/2013   Pneumococcal Polysaccharide-23 12/27/2010, 12/05/2016   RSV,unspecified 03/28/2022   Tdap 04/15/2017   Zoster Recombinant(Shingrix) 04/09/2017, 06/18/2017   Pertinent  Health Maintenance Due  Topic Date Due   INFLUENZA VACCINE  10/25/2023   DEXA SCAN  Completed      05/21/2022    1:06 PM  06/06/2022    4:55 PM 07/09/2022    1:44 PM 08/29/2022    3:31 PM 04/18/2023   12:52 PM  Fall Risk  Falls in the past year? 1 1 0 1 0  Was there an injury with Fall? 1 1 0 1 0  Fall Risk Category Calculator 3 3 0 2 0  Patient at Risk for Falls Due to History of fall(s);Impaired balance/gait;Impaired mobility History of fall(s);Impaired balance/gait;Impaired mobility History of fall(s);Impaired balance/gait;Impaired mobility    Fall risk Follow up  Falls evaluation completed Falls evaluation completed;Education provided;Falls prevention discussed Falls evaluation completed Falls evaluation completed Falls evaluation completed   Functional Status Survey:    Vitals:   08/23/23 1253  BP: 120/72  Pulse: 80  Resp: (!) 24  Temp: (!) 97 F (36.1 C)  SpO2: 95%   There is no height or weight on file to calculate BMI. Physical Exam Constitutional:      General: She is not in acute distress.    Appearance: She is not diaphoretic.  HENT:     Head: Normocephalic and atraumatic.     Nose: Congestion present.     Comments: Erythema, yellow drainage    Mouth/Throat:     Mouth: Mucous membranes are moist.     Pharynx: Oropharynx is clear. No oropharyngeal exudate.  Eyes:     Conjunctiva/sclera: Conjunctivae normal.     Pupils: Pupils are equal, round, and reactive to light.     Comments: Slight swelling of right eye lid  Neck:     Vascular: No JVD.  Cardiovascular:     Rate and Rhythm: Normal rate. Rhythm irregular.     Heart sounds: No murmur heard. Pulmonary:     Effort: Pulmonary effort is normal. No respiratory distress.     Breath sounds: Rales (bases) present. No wheezing.  Abdominal:     General: Bowel sounds are normal. There is no distension.     Palpations: Abdomen is soft.  Musculoskeletal:     Cervical back: No rigidity or tenderness.     Comments: Edema to both ankles   Lymphadenopathy:     Cervical: No cervical adenopathy.  Skin:    General: Skin is warm and dry.   Neurological:     General: No focal deficit present.     Mental Status: She is alert. Mental status is at baseline.     Labs reviewed: Recent Labs    01/07/23 0000 02/05/23 1146 08/02/23 0000  NA 137 138 133*  K 4.0 4.1 4.2  CL 102 104 99  CO2 23* 24 23*  GLUCOSE  --  96  --   BUN 23* 34* 21  CREATININE 0.9 1.15* 0.9  CALCIUM  8.9 9.8 9.2   Recent Labs    10/18/22 0000 12/31/22 0000 02/05/23 1146 08/02/23 0000  AST 17 26 18 25   ALT 16 36* 19 22  ALKPHOS 90 104 91 118  BILITOT  --   --  0.8  --   PROT  --   --  7.1  --   ALBUMIN 4.4 4.1 4.1  --    Recent Labs    10/11/22 1405 10/18/22 0000 12/31/22 0000 02/05/23 1146 04/04/23 0000 08/02/23 0000 08/13/23 1130  WBC 9.9   < > 6.4 7.6 5.8 5.8 7.6  NEUTROABS 5.7   < > 3.70 4.5  --   --  4.3  HGB 11.9*   < > 10.6* 11.5* 11.0* 11.2* 11.8*  HCT 35.6*   < > 31* 34.6* 33* 34* 35.0*  MCV 102.6*  --   --  102.4*  --   --  103.6*  PLT 193   < > 157 163 144* 167 177   < > = values in this interval not displayed.   Lab Results  Component Value Date   TSH 20.30 (A) 08/02/2023   Lab Results  Component Value Date   HGBA1C 5.5 01/12/2013   Lab Results  Component Value Date   CHOL 114 08/02/2023   HDL 53 08/02/2023  LDLCALC 44 08/02/2023   TRIG 86 08/02/2023    Significant Diagnostic Results in last 30 days:  No results found.  Assessment/Plan  Dyspnea on exertion Chronic dyspnea on exertion with oxygen desaturation during ambulation with slight worsening. - Order chest x-ray to evaluate for acute infection or other causes. - Recommend rest between ambulation. If worsening may need 02 with exertion - Administer Xopenex  as needed, try in the morning before activity. - Consider pulmonology follow-up if chest x-ray shows no acute findings.  Interstitial lung disease Biapical pleural parenchymal thickening and scarring noted on CT scan in January 2023, possible contributor to dyspnea.  Chronic diastolic  heart failure Moderately elevated pulmonary artery pressure, EF 55-60%, severely dilated atria. Takes lasix  prn but has not needed due to stable weight Could also contribute to dyspnea  Atrial fibrillation Reviewed echocardiogram On eliquis   Followed by cardiology  On Metoprolol  for rate control   Hypertension Controlled Continue current meds  Hypothyroidism Elevated TSH of 20.3, Synthroid  dose adjusted. - Repeat TSH in 6-8 weeks.  Allergic rhinitis Reports of nasal congestion, consideration of allergic rhinitis. - Consider steroid nasal spray if chest x-ray shows no acute infection.  History of colon cancer History of malignant neoplasm of the splenic flexure, recent CA level 6.5.   Addendum 08/24/23 CXR returned showing mild CHF or volume overload. Also differential diagnosis includes bronchitis or pneumonia.  Will give lasix  for two days and Doxycycline  for 7 days and monitor for improvement.

## 2023-08-25 MED ORDER — DOXYCYCLINE HYCLATE 100 MG PO TABS: 100.0000 mg | ORAL_TABLET | Freq: Two times a day (BID) | ORAL | Status: AC

## 2023-08-25 NOTE — Addendum Note (Signed)
 Addended by: Arley Garant L on: 08/25/2023 07:43 AM   Modules accepted: Orders

## 2023-08-28 DIAGNOSIS — R41841 Cognitive communication deficit: Secondary | ICD-10-CM | POA: Diagnosis not present

## 2023-08-28 DIAGNOSIS — F01A0 Vascular dementia, mild, without behavioral disturbance, psychotic disturbance, mood disturbance, and anxiety: Secondary | ICD-10-CM | POA: Diagnosis not present

## 2023-08-29 DIAGNOSIS — F01A0 Vascular dementia, mild, without behavioral disturbance, psychotic disturbance, mood disturbance, and anxiety: Secondary | ICD-10-CM | POA: Diagnosis not present

## 2023-08-29 DIAGNOSIS — R41841 Cognitive communication deficit: Secondary | ICD-10-CM | POA: Diagnosis not present

## 2023-09-03 DIAGNOSIS — R41841 Cognitive communication deficit: Secondary | ICD-10-CM | POA: Diagnosis not present

## 2023-09-03 DIAGNOSIS — F01A0 Vascular dementia, mild, without behavioral disturbance, psychotic disturbance, mood disturbance, and anxiety: Secondary | ICD-10-CM | POA: Diagnosis not present

## 2023-09-06 DIAGNOSIS — R41841 Cognitive communication deficit: Secondary | ICD-10-CM | POA: Diagnosis not present

## 2023-09-06 DIAGNOSIS — F01A0 Vascular dementia, mild, without behavioral disturbance, psychotic disturbance, mood disturbance, and anxiety: Secondary | ICD-10-CM | POA: Diagnosis not present

## 2023-09-09 ENCOUNTER — Encounter: Payer: Self-pay | Admitting: Internal Medicine

## 2023-09-09 ENCOUNTER — Non-Acute Institutional Stay (SKILLED_NURSING_FACILITY): Payer: Self-pay | Admitting: Internal Medicine

## 2023-09-09 DIAGNOSIS — I4821 Permanent atrial fibrillation: Secondary | ICD-10-CM | POA: Diagnosis not present

## 2023-09-09 DIAGNOSIS — R0609 Other forms of dyspnea: Secondary | ICD-10-CM

## 2023-09-09 DIAGNOSIS — F01A0 Vascular dementia, mild, without behavioral disturbance, psychotic disturbance, mood disturbance, and anxiety: Secondary | ICD-10-CM | POA: Diagnosis not present

## 2023-09-09 DIAGNOSIS — I5032 Chronic diastolic (congestive) heart failure: Secondary | ICD-10-CM

## 2023-09-09 DIAGNOSIS — R41841 Cognitive communication deficit: Secondary | ICD-10-CM | POA: Diagnosis not present

## 2023-09-09 DIAGNOSIS — J984 Other disorders of lung: Secondary | ICD-10-CM | POA: Diagnosis not present

## 2023-09-09 NOTE — Progress Notes (Signed)
 Location: Medical illustrator of Service:  SNF ([id number])  Provider:   Code Status: DNR Goals of Care:     08/22/2023   11:34 AM  Advanced Directives  Does Patient Have a Medical Advance Directive? Yes  Type of Advance Directive Living will;Healthcare Power of [place];Out of facility DNR (pink MOST or yellow form)  Does patient want to make changes to medical advance directive? No - Patient declined  Copy of Healthcare Power of Attorney in Chart? Yes - validated most recent copy scanned in chart (See row information)     Chief Complaint  Patient presents with   Acute Visit    HPI: Patient is a 89 y.o. female seen today for an acute visit for SOB on Exertion  Lives in SNF    Has A Fib on Eliquis   Hypertension and HLD H/o TIA  Cerebral Amyloid angiopathy on MRI Colon Cancer treated with Immunotherapy No Surgery in Remission  Anemia  Cognitive impairment MMSE 15/30 Rheumatoid arthritis on Arava   H/o CHF  with Elevated PHT   Patient was seen on 5/30 worsening of her dyspnea with cough.  A chest x-ray showed mild CHF though her weight was stable.  She was treated with doxycycline  and Lasix  for 2 days.  Per nurses she got better but now she started having dyspnea again with cough which is mostly dry.  Had daughter also noticed that patient was getting worse with her congestion.  Patient does not have any fever.  No chest pain.  She is able to walk with her walker Wt Readings from Last 3 Encounters:  09/09/23 123 lb (55.8 kg)  08/22/23 124 lb (56.2 kg)  08/13/23 127 lb 9.6 oz (57.9 kg)     Past Medical History:  Diagnosis Date   Allergy    seasonal   Anemia    Anxiety    Arthritis    Back   Asymptomatic menopausal state    Atrial fibrillation (HCC)    Cancer (HCC) 2006   Colon.  Basal Cell Skin cancer- right arm   Cataract    removed bilateral   Chronic atrial fibrillation (HCC)    Colon cancer (HCC)    Constipation due to pain medication  therapy    after heart surgery   Deficiency of other specified B group vitamins    Diplopia    Disorientation, unspecified    Dysrhythmia    PAF   GERD (gastroesophageal reflux disease)    Heart murmur    Hyperlipidemia    Hypertension    Hypothyroidism    Malignant neoplasm of colon, unspecified (HCC)    Other specified diseases of blood and blood-forming organs    Other specified disorders of bone density and structure, unspecified forearm    Other thrombophilia (HCC)    per matrix   Overactive bladder    per Matrix   Personal history of other malignant neoplasm of large intestine    Presbycusis, bilateral    per matrix   RA (rheumatoid arthritis) (HCC)    Restless leg    Seizures (HCC)    after Heart Surgey   Stroke (HCC)    TIA- found by neurologist after    Valgus deformity, not elsewhere classified, right knee    per matrix   Vascular dementia, mild, without behavioral disturbance, psychotic disturbance, mood disturbance, and anxiety (HCC)    per matrix    Past Surgical History:  Procedure Laterality Date   ABDOMINAL HYSTERECTOMY  1970  Partial    COLON RESECTION  2006   cancer   COLON SURGERY     COLONOSCOPY     EYE SURGERY Bilateral    Cataract   MAXIMUM ACCESS (MAS)POSTERIOR LUMBAR INTERBODY FUSION (PLIF) 2 LEVEL N/A 04/12/2015   Procedure: Lumbar Three-Five Decompression, Pedicle Screw Fixation, and Posteriolateral Arthrodesis;  Surgeon: Manya Sells, MD;  Location: [facility];  Service: Neurosurgery;  Laterality: N/A;  L3-4 L4-5 Maximum access posterior lumbar fusion, possible interbodies and resection of synovial cyst at L4-5   MITRAL VALVE REPAIR  01/20/2013   Gore-tex cords to P1, P2, and P3. Magic suture to posterior medial commisure, #30 Physio 1 ring. Done in [place]    TONSILLECTOMY     about 1940   TRICUSPID VALVE SURGERY  01/20/2013   #28 TriAd ring done in [place]     Allergies  Allergen Reactions   Claritin [Loratadine] Other (See  Comments)    Listed on MAR Unknown reaction   Codeine Other (See Comments)    just don't take it well   Gabapentin  Other (See Comments)    Dizziness    Molds & Smuts Other (See Comments)    Dust.  Reaction is not listed on MAR    Pollen Extract Swelling    Grass   Bee Venom Swelling and Rash   Wasp Venom Swelling and Rash    Outpatient Encounter Medications as of 09/09/2023  Medication Sig   acetaminophen  (TYLENOL ) 500 MG tablet Take 1,000 mg by mouth in the morning and at bedtime.   amoxicillin (AMOXIL) 500 MG capsule Take 1,000 mg by mouth as needed.   apixaban  (ELIQUIS ) 2.5 MG TABS tablet Take 1 tablet (2.5 mg total) by mouth 2 (two) times daily.   atorvastatin  (LIPITOR) 20 MG tablet TAKE 1 TABLET DAILY   Cholecalciferol  (VITAMIN D ) 50 MCG (2000 UT) CAPS Take 2,000 Units by mouth in the morning.   cloNIDine  (CATAPRES ) 0.1 MG tablet Take 1 tablet (0.1 mg total) by mouth daily as needed. If SBP >180 only   ENBREL SURECLICK 50 MG/ML injection Inject 50 mg into the skin once a week. (Patient not taking: Reported on 08/22/2023)   furosemide  (LASIX ) 20 MG tablet Take 20 mg daily until weight is 124 pounds. Then take 20 mg daily ONLY AS NEEDED for weight of 125 pounds or greater.   Iron , Ferrous Sulfate , 325 (65 Fe) MG TABS Take 325 mg by mouth daily.   leflunomide  (ARAVA ) 20 MG tablet Take 20 mg by mouth in the morning.   levalbuterol  (XOPENEX ) 0.63 MG/3ML nebulizer solution Take 3 mLs (0.63 mg total) by nebulization every 6 (six) hours as needed for wheezing or shortness of breath.   levothyroxine  (SYNTHROID ) 88 MCG tablet Take 88 mcg by mouth daily before breakfast.   METOPROLOL  SUCCINATE PO Take 150 mg by mouth in the morning.   mirtazapine  (REMERON ) 15 MG tablet TAKE 1 TABLET AT BEDTIME   Multiple Vitamin (MULTIVITAMIN ADULT PO) Take 1 tablet by mouth every morning.   Multiple Vitamins-Minerals (PRESERVISION AREDS 2) CAPS Take 1 capsule by mouth in the morning and at bedtime.    omeprazole  (PRILOSEC) 40 MG capsule TAKE 1 CAPSULE DAILY   valsartan  (DIOVAN ) 160 MG tablet Take 160 mg by mouth daily.   Vibegron  (GEMTESA ) 75 MG TABS Take 75 mg by mouth at bedtime.   vitamin B-12 (CYANOCOBALAMIN ) 1000 MCG tablet Take 1,000 mcg by mouth in the morning.   No facility-administered encounter medications on file as of 09/09/2023.  Review of Systems:  Review of Systems  Constitutional:  Positive for activity change. Negative for appetite change.  HENT: Negative.    Respiratory:  Positive for cough and shortness of breath.   Cardiovascular:  Negative for leg swelling.  Gastrointestinal:  Negative for constipation.  Genitourinary: Negative.   Musculoskeletal:  Negative for arthralgias, gait problem and myalgias.  Skin: Negative.   Neurological:  Negative for dizziness and weakness.  Psychiatric/Behavioral:  Positive for confusion. Negative for dysphoric mood and sleep disturbance.     Health Maintenance  Topic Date Due   Medicare Annual Wellness (AWV)  03/23/2023   COVID-19 Vaccine (8 - 2024-25 season) 03/26/2024 (Originally 03/12/2023)   INFLUENZA VACCINE  10/25/2023   DTaP/Tdap/Td (2 - Td or Tdap) 04/16/2027   Pneumococcal Vaccine: 50+ Years  Completed   DEXA SCAN  Completed   Zoster Vaccines- Shingrix  Completed   HPV VACCINES  Aged Out   Meningococcal B Vaccine  Aged Out    Physical Exam: Vitals:   09/09/23 2114  BP: (!) 140/80  Pulse: 82  Resp: (!) 25  Temp: 97.6 F (36.4 C)  SpO2: 97%  Weight: 123 lb (55.8 kg)   Body mass index is 22.5 kg/m. Physical Exam Vitals reviewed.  Constitutional:      Appearance: Normal appearance.  HENT:     Head: Normocephalic.     Nose: Nose normal.     Mouth/Throat:     Mouth: Mucous membranes are moist.     Pharynx: Oropharynx is clear.   Eyes:     Pupils: Pupils are equal, round, and reactive to light.    Cardiovascular:     Rate and Rhythm: Normal rate. Rhythm irregular.     Pulses: Normal pulses.      Heart sounds: Normal heart sounds. No murmur heard. Pulmonary:     Effort: Pulmonary effort is normal.     Breath sounds: Wheezing present. No rales.  Abdominal:     General: Abdomen is flat. Bowel sounds are normal.     Palpations: Abdomen is soft.   Musculoskeletal:        General: No swelling.     Cervical back: Neck supple.     Comments: Mild edema Bilateral   Skin:    General: Skin is warm.   Neurological:     General: No focal deficit present.     Mental Status: She is alert.   Psychiatric:        Mood and Affect: Mood normal.        Thought Content: Thought content normal.     Labs reviewed: Basic Metabolic Panel: Recent Labs    01/07/23 0000 02/05/23 1146 04/04/23 0000 08/02/23 0000  NA 137 138  --  133*  K 4.0 4.1  --  4.2  CL 102 104  --  99  CO2 23* 24  --  23*  GLUCOSE  --  96  --   --   BUN 23* 34*  --  21  CREATININE 0.9 1.15*  --  0.9  CALCIUM  8.9 9.8  --  9.2  TSH 0.28* 0.043* 12.60* 20.30*   Liver Function Tests: Recent Labs    10/18/22 0000 12/31/22 0000 02/05/23 1146 08/02/23 0000  AST 17 26 18 25   ALT 16 36* 19 22  ALKPHOS 90 104 91 118  BILITOT  --   --  0.8  --   PROT  --   --  7.1  --   ALBUMIN 4.4  4.1 4.1  --    No results for input(s): LIPASE, AMYLASE in the last 8760 hours. No results for input(s): AMMONIA in the last 8760 hours. CBC: Recent Labs    10/11/22 1405 10/18/22 0000 12/31/22 0000 02/05/23 1146 04/04/23 0000 08/02/23 0000 08/13/23 1130  WBC 9.9   < > 6.4 7.6 5.8 5.8 7.6  NEUTROABS 5.7   < > 3.70 4.5  --   --  4.3  HGB 11.9*   < > 10.6* 11.5* 11.0* 11.2* 11.8*  HCT 35.6*   < > 31* 34.6* 33* 34* 35.0*  MCV 102.6*  --   --  102.4*  --   --  103.6*  PLT 193   < > 157 163 144* 167 177   < > = values in this interval not displayed.   Lipid Panel: Recent Labs    04/04/23 0000 08/02/23 0000  CHOL 108 114  HDL 49 53  LDLCALC 41 44  TRIG 93 86   Lab Results  Component Value Date   HGBA1C 5.5  01/12/2013    Procedures since last visit: No results found.  Assessment/Plan 1. Dyspnea on exertion (Primary) Possible Bronchitis / CHF ? Did respond to Lasix  Weight is down Will Check BNP  Xopenex  BID for 3 days and then PRN Also start on Prednisone 20 mg every day for 7 days  2. Permanent atrial fibrillation (HCC) On Eliquis  and Metoprolol   3. Chronic diastolic heart failure (HCC) Gets Lasix  Prn  Weight has been less then 125 lbs   4. Chronic lung disease Seen on CT scan   D/w daughter  Labs/tests ordered:  BNP,CMP, CBC Next appt:  Visit date not found

## 2023-09-10 DIAGNOSIS — I509 Heart failure, unspecified: Secondary | ICD-10-CM | POA: Diagnosis not present

## 2023-09-10 DIAGNOSIS — J984 Other disorders of lung: Secondary | ICD-10-CM | POA: Diagnosis not present

## 2023-09-10 DIAGNOSIS — D509 Iron deficiency anemia, unspecified: Secondary | ICD-10-CM | POA: Diagnosis not present

## 2023-09-11 ENCOUNTER — Encounter: Payer: Self-pay | Admitting: Cardiovascular Disease

## 2023-09-11 ENCOUNTER — Encounter: Payer: Self-pay | Admitting: Orthopedic Surgery

## 2023-09-11 ENCOUNTER — Non-Acute Institutional Stay (SKILLED_NURSING_FACILITY): Payer: Self-pay | Admitting: Orthopedic Surgery

## 2023-09-11 DIAGNOSIS — I5033 Acute on chronic diastolic (congestive) heart failure: Secondary | ICD-10-CM | POA: Diagnosis not present

## 2023-09-11 MED ORDER — FUROSEMIDE 20 MG PO TABS: 40.0000 mg | ORAL_TABLET | Freq: Every day | ORAL | Status: DC

## 2023-09-11 MED ORDER — FUROSEMIDE 20 MG PO TABS: 20.0000 mg | ORAL_TABLET | Freq: Every day | ORAL | Status: DC

## 2023-09-11 NOTE — Progress Notes (Signed)
 Location:  [facility] Room Number: 129/A Place of Service:  SNF [phone]) Provider:  Arnetha Bhat, NP   Marguerite Shiley, MD  Patient Care Team: Marguerite Shiley, MD as PCP - General (Internal Medicine) Croitoru, Karyl Paget, MD as PCP - Cardiology (Cardiology) Merriam Abbey, DO as Consulting Physician (Neurology)  Extended Emergency Contact Information Primary Emergency Contact: [name],[name] Address: [address]          [place], [place] [address] [place] Home Phone: [phone] Mobile Phone: [phone] Relation: Daughter Secondary Emergency Contact: Myrene Ash Address: [address]          [place], [place] [address] [place] Phone: [phone] Relation: Son  Code Status:  DNR Goals of care: Advanced Directive information    08/22/2023   11:34 AM  Advanced Directives  Does Patient Have a Medical Advance Directive? Yes  Type of Advance Directive Living will;Healthcare Power of [place];Out of facility DNR (pink MOST or yellow form)  Does patient want to make changes to medical advance directive? No - Patient declined  Copy of Healthcare Power of Attorney in Chart? Yes - validated most recent copy scanned in chart (See row information)     Chief Complaint  Patient presents with   Acute Visit    Elevated BNP    HPI:  Pt is a 89 y.o. female seen today for acute visit due to elevated BNP.    H/o interstitial lung disease, heart failure, PAF, RA and colon cancer. She began to have DOE 05/30. CXR noted mild CHF/ volume overload, no consolidation, effusion or pneumothorax noted. Her weights were stable at that time. She was prescribed Lasix  x 2 days and doxycycline . She did improve for awhile. She did have some weight loss with lasix . 06/16 chest congestion worsened. Prednisone 20 mg x 7 days and xopenex  started. BNP was found to be 5297. Today, respirations are slightly elevated. She continues to have  intermittent productive cough. Afebrile.   Past Medical History:  Diagnosis Date   Allergy    seasonal   Anemia    Anxiety    Arthritis    Back   Asymptomatic menopausal state    Atrial fibrillation (HCC)    Cancer (HCC) 2006   Colon.  Basal Cell Skin cancer- right arm   Cataract    removed bilateral   Chronic atrial fibrillation (HCC)    Colon cancer (HCC)    Constipation due to pain medication therapy    after heart surgery   Deficiency of other specified B group vitamins    Diplopia    Disorientation, unspecified    Dysrhythmia    PAF   GERD (gastroesophageal reflux disease)    Heart murmur    Hyperlipidemia    Hypertension    Hypothyroidism    Malignant neoplasm of colon, unspecified (HCC)    Other specified diseases of blood and blood-forming organs    Other specified disorders of bone density and structure, unspecified forearm    Other thrombophilia (HCC)    per matrix   Overactive bladder    per Matrix   Personal history of other malignant neoplasm of large intestine    Presbycusis, bilateral    per matrix   RA (rheumatoid arthritis) (HCC)    Restless leg    Seizures (HCC)    after Heart Surgey   Stroke (HCC)    TIA- found by neurologist after    Valgus deformity, not elsewhere classified, right  knee    per matrix   Vascular dementia, mild, without behavioral disturbance, psychotic disturbance, mood disturbance, and anxiety (HCC)    per matrix   Past Surgical History:  Procedure Laterality Date   ABDOMINAL HYSTERECTOMY  1970   Partial    COLON RESECTION  2006   cancer   COLON SURGERY     COLONOSCOPY     EYE SURGERY Bilateral    Cataract   MAXIMUM ACCESS (MAS)POSTERIOR LUMBAR INTERBODY FUSION (PLIF) 2 LEVEL N/A 04/12/2015   Procedure: Lumbar Three-Five Decompression, Pedicle Screw Fixation, and Posteriolateral Arthrodesis;  Surgeon: Manya Sells, MD;  Location: [facility];  Service: Neurosurgery;  Laterality: N/A;  L3-4 L4-5 Maximum access  posterior lumbar fusion, possible interbodies and resection of synovial cyst at L4-5   MITRAL VALVE REPAIR  01/20/2013   Gore-tex cords to P1, P2, and P3. Magic suture to posterior medial commisure, #30 Physio 1 ring. Done in [place]    TONSILLECTOMY     about 1940   TRICUSPID VALVE SURGERY  01/20/2013   #28 TriAd ring done in [place]     Allergies  Allergen Reactions   Claritin [Loratadine] Other (See Comments)    Listed on MAR Unknown reaction   Codeine Other (See Comments)    just don't take it well   Gabapentin  Other (See Comments)    Dizziness    Molds & Smuts Other (See Comments)    Dust.  Reaction is not listed on MAR    Pollen Extract Swelling    Grass   Bee Venom Swelling and Rash   Wasp Venom Swelling and Rash    Outpatient Encounter Medications as of 09/11/2023  Medication Sig   acetaminophen  (TYLENOL ) 500 MG tablet Take 1,000 mg by mouth in the morning and at bedtime.   amoxicillin (AMOXIL) 500 MG capsule Take 1,000 mg by mouth as needed.   apixaban  (ELIQUIS ) 2.5 MG TABS tablet Take 1 tablet (2.5 mg total) by mouth 2 (two) times daily.   atorvastatin  (LIPITOR) 20 MG tablet TAKE 1 TABLET DAILY   Cholecalciferol  (VITAMIN D ) 50 MCG (2000 UT) CAPS Take 2,000 Units by mouth in the morning.   cloNIDine  (CATAPRES ) 0.1 MG tablet Take 1 tablet (0.1 mg total) by mouth daily as needed. If SBP >180 only   ENBREL SURECLICK 50 MG/ML injection Inject 50 mg into the skin once a week. (Patient not taking: Reported on 08/22/2023)   furosemide  (LASIX ) 20 MG tablet Take 20 mg daily until weight is 124 pounds. Then take 20 mg daily ONLY AS NEEDED for weight of 125 pounds or greater.   Iron , Ferrous Sulfate , 325 (65 Fe) MG TABS Take 325 mg by mouth daily.   leflunomide  (ARAVA ) 20 MG tablet Take 20 mg by mouth in the morning.   levalbuterol  (XOPENEX ) 0.63 MG/3ML nebulizer solution Take 3 mLs (0.63 mg total) by nebulization every 6 (six) hours as needed for wheezing or shortness of breath.    levothyroxine  (SYNTHROID ) 88 MCG tablet Take 88 mcg by mouth daily before breakfast.   METOPROLOL  SUCCINATE PO Take 150 mg by mouth in the morning.   mirtazapine  (REMERON ) 15 MG tablet TAKE 1 TABLET AT BEDTIME   Multiple Vitamin (MULTIVITAMIN ADULT PO) Take 1 tablet by mouth every morning.   Multiple Vitamins-Minerals (PRESERVISION AREDS 2) CAPS Take 1 capsule by mouth in the morning and at bedtime.   omeprazole  (PRILOSEC) 40 MG capsule TAKE 1 CAPSULE DAILY   valsartan  (DIOVAN ) 160 MG tablet Take 160 mg by mouth  daily.   Vibegron  (GEMTESA ) 75 MG TABS Take 75 mg by mouth at bedtime.   vitamin B-12 (CYANOCOBALAMIN ) 1000 MCG tablet Take 1,000 mcg by mouth in the morning.   No facility-administered encounter medications on file as of 09/11/2023.    Review of Systems  Constitutional:  Negative for fatigue and fever.  HENT:  Positive for congestion.   Respiratory:  Positive for cough, shortness of breath and wheezing.   Cardiovascular:  Negative for chest pain and leg swelling.  Gastrointestinal:  Negative for abdominal distention and abdominal pain.  Genitourinary:  Negative for dysuria and hematuria.  Musculoskeletal:  Positive for gait problem.  Skin:  Negative for wound.  Neurological:  Negative for dizziness and headaches.  Psychiatric/Behavioral:  Negative for dysphoric mood. The patient is not nervous/anxious.     Immunization History  Administered Date(s) Administered   Fluad Quad(high Dose 65+) 01/05/2019, 12/07/2021, 01/15/2023   Fluad Trivalent(High Dose 65+) 01/15/2023   H1N1 04/07/2008   Influenza Whole 12/29/2008, 12/22/2009   Influenza, High Dose Seasonal PF 01/15/2012, 12/23/2014, 01/22/2020   Influenza,inj,Quad PF,6+ Mos 01/22/2014, 12/10/2016, 01/17/2018   Influenza,trivalent, recombinat, inj, PF 01/05/2016   Influenza-Unspecified 04/07/2008, 12/15/2014, 01/13/2021   Moderna Covid-19 Vaccine  Bivalent Booster 61yrs & up 01/29/2022, 01/15/2023   Moderna Sars-Covid-2  Vaccination 04/07/2019, 05/05/2019, 02/09/2020   PFIZER(Purple Top)SARS-COV-2 Vaccination 01/06/2021   PPD Test 07/17/2010, 04/19/2015, 04/25/2015   Pfizer Covid-19 Vaccine Bivalent Booster 58yrs & up 07/19/2022   Pneumococcal Conjugate-13 11/10/2013   Pneumococcal Polysaccharide-23 12/27/2010, 12/05/2016   RSV,unspecified 03/28/2022   Tdap 04/15/2017   Zoster Recombinant(Shingrix) 04/09/2017, 06/18/2017   Pertinent  Health Maintenance Due  Topic Date Due   INFLUENZA VACCINE  10/25/2023   DEXA SCAN  Completed      05/21/2022    1:06 PM 06/06/2022    4:55 PM 07/09/2022    1:44 PM 08/29/2022    3:31 PM 04/18/2023   12:52 PM  Fall Risk  Falls in the past year? 1 1 0 1 0  Was there an injury with Fall? 1 1 0 1 0  Fall Risk Category Calculator 3 3 0 2 0  Patient at Risk for Falls Due to History of fall(s);Impaired balance/gait;Impaired mobility History of fall(s);Impaired balance/gait;Impaired mobility History of fall(s);Impaired balance/gait;Impaired mobility    Fall risk Follow up Falls evaluation completed Falls evaluation completed;Education provided;Falls prevention discussed Falls evaluation completed Falls evaluation completed Falls evaluation completed   Functional Status Survey:    Vitals:   09/11/23 1204  BP: 124/74  Resp: (!) 25  Temp: 97.6 F (36.4 C)  SpO2: 94%  Weight: 122 lb 12.8 oz (55.7 kg)  Height: 5' 2 (1.575 m)   Body mass index is 22.46 kg/m. Physical Exam Vitals reviewed.  Constitutional:      General: She is not in acute distress. HENT:     Head: Normocephalic.   Eyes:     General:        Right eye: No discharge.        Left eye: No discharge.    Cardiovascular:     Rate and Rhythm: Normal rate and regular rhythm.     Pulses: Normal pulses.     Heart sounds: Murmur heard.  Pulmonary:     Effort: Pulmonary effort is normal. No respiratory distress.     Breath sounds: Rales present. No wheezing.  Abdominal:     General: There is no  distension.     Palpations: Abdomen is soft.     Tenderness:  There is no abdominal tenderness.   Musculoskeletal:        General: Normal range of motion.     Cervical back: Neck supple.   Skin:    General: Skin is warm.     Capillary Refill: Capillary refill takes less than 2 seconds.   Neurological:     General: No focal deficit present.     Mental Status: She is alert. Mental status is at baseline.   Psychiatric:        Mood and Affect: Mood normal.     Labs reviewed: Recent Labs    01/07/23 0000 02/05/23 1146 08/02/23 0000  NA 137 138 133*  K 4.0 4.1 4.2  CL 102 104 99  CO2 23* 24 23*  GLUCOSE  --  96  --   BUN 23* 34* 21  CREATININE 0.9 1.15* 0.9  CALCIUM  8.9 9.8 9.2   Recent Labs    10/18/22 0000 12/31/22 0000 02/05/23 1146 08/02/23 0000  AST 17 26 18 25   ALT 16 36* 19 22  ALKPHOS 90 104 91 118  BILITOT  --   --  0.8  --   PROT  --   --  7.1  --   ALBUMIN 4.4 4.1 4.1  --    Recent Labs    10/11/22 1405 10/18/22 0000 12/31/22 0000 02/05/23 1146 04/04/23 0000 08/02/23 0000 08/13/23 1130  WBC 9.9   < > 6.4 7.6 5.8 5.8 7.6  NEUTROABS 5.7   < > 3.70 4.5  --   --  4.3  HGB 11.9*   < > 10.6* 11.5* 11.0* 11.2* 11.8*  HCT 35.6*   < > 31* 34.6* 33* 34* 35.0*  MCV 102.6*  --   --  102.4*  --   --  103.6*  PLT 193   < > 157 163 144* 167 177   < > = values in this interval not displayed.   Lab Results  Component Value Date   TSH 20.30 (A) 08/02/2023   Lab Results  Component Value Date   HGBA1C 5.5 01/12/2013   Lab Results  Component Value Date   CHOL 114 08/02/2023   HDL 53 08/02/2023   LDLCALC 44 08/02/2023   TRIG 86 08/02/2023    Significant Diagnostic Results in last 30 days:  No results found.  Assessment/Plan 1. Acute on chronic diastolic congestive heart failure (HCC) (Primary) - 05/30 DOE> CXR noted mild CHF/volume overload> lasix  x 2 days and doxycycline  started - 06/16 DOE and continued congestion> BNP 5297 - weights have been  stable, no edema, RR 18-24  - profuse rales on exam  - cardiology> hospitalization versus outpatient treatment > concerns for degeneration of mitral valve versus CHF - will start furosemide  40 mg po x 2 days, then 20 mg po daily  - schedule with cardiology in 1-2 weeks - family declined KCL due to bowel irritability - bmp 06/24 to check potassium level and kidney function - if breathing worsens> recommend ED evaluation     Family/ staff Communication: plan discussed with patient, nurse and daughter   Labs/tests ordered:  bmp 06/24

## 2023-09-12 DIAGNOSIS — F01A0 Vascular dementia, mild, without behavioral disturbance, psychotic disturbance, mood disturbance, and anxiety: Secondary | ICD-10-CM | POA: Diagnosis not present

## 2023-09-12 DIAGNOSIS — R41841 Cognitive communication deficit: Secondary | ICD-10-CM | POA: Diagnosis not present

## 2023-09-12 NOTE — Progress Notes (Signed)
 Cardiology Office Note   Date:  09/12/2023  ID:  Kathy Velez, DOB 09-13-1931, MRN [id number] PCP: Charlanne Fredia CROME, MD  [facility] Providers Cardiologist:  Jerel Balding, MD   History of Present Illness Kathy Velez is a 89 y.o. female with a pat medical history of paroxysmal atrial fibrillation, history of CVA, vascular dementia, carotid artery disease, aortic atherosclerosis, HTN, history of mitral and tricuspid valve repair (MV #30 Physio Ring; TV # 28 TriAd Ring 2014, [place]), chronic interstitial lung disease, history of colon cancer in remission, GERD, treated hypothyroidism, hyponatremia (probable SIADH). Patient lives at [facility], a skilled nursing facility. Presents today for elevated BNP.   Patient's most recent echocardiogram from 02/12/23 showed LVEF 55-60%, mildly reduced RV systolic function. Severe bi atrial dilation. S/p mitral valve and tricuspid valve repairs. No significant changes from prior study. Patient has history of hyponatremia and takes salt tablets. Afib is rate controlled with beta-blockers and she is on eliquis  for stroke prevention. She takes lasix  as needed for weight gain.   Patient's nursing facility contacted the office on 6/18. Her BNP was elevated to >5000. Her oxygen sats were good, so she was treated with higher dose of lasix  for several days. Appointment was arranged for follow up   Today, patient  presents from her nursing facility with her daughter.  Reports that her she has not been feeling quite right.  She was diagnosed with pneumonia a few weeks ago and was treated with a course of antibiotics.  She more recently was found to have an elevated proBNP and was started on Lasix  20 mg daily.  Had previously been on Lasix  20 mg as needed, had not really been taking it as her weight has been stable.  Since being diagnosed with pneumonia, she has been having issues with shortness of breath on exertion.  Also has a ongoing cough.  Sounds like there is  congestion in her chest but the cough does not produce any sputum.  She denies orthopnea, lower extremity swelling, weight gain.  Her nursing facility has her on breathing treatments that do seem to help her breathing.  Her daughter has questions about if her elevated BNP means that her heart failure is progressing.  Patient would prefer to avoid daily Lasix  as it affects her quality of life.  She has urinary and fecal incontinence at baseline, Lasix  seems to worsen these and she is afraid to leave her room.   Studies Reviewed Cardiac Studies & Procedures   ______________________________________________________________________________________________     ECHOCARDIOGRAM  ECHOCARDIOGRAM COMPLETE 02/12/2023  Narrative ECHOCARDIOGRAM REPORT    [name]   Date of Exam: 02/12/2023 Medical Rec #:  [id number]     Height:       62.0 in Accession #:    [id number]    Weight:       126.9 lb Date of Birth:  05-18-1931     BSA:          1.576 m Patient Age:    89 years      BP:           142/87 mmHg Patient Gender: F             HR:           79 bpm. Exam Location:  [address]  Procedure: 2D Echo, Cardiac Doppler, Color Doppler and 3D Echo  Indications:    S/P Mitral Valve repair Z98.89; Dyspnea  History:  Patient has prior history of Echocardiogram examinations, most recent 11/27/2018. Arrythmias:Atrial Fibrillation; Risk Factors:Hypertension and Dyslipidemia.  Mitral Valve Repair: Gore-tex cords to P1, P2, and P3. Magic suture to posterior medial commisure, #30 Physio 1 ring. Done in [place] . Procedure Date: 01/20/2013.  Sonographer:    Augustin Seals RDCS Referring Phys: [phone] MIHAI CROITORU  IMPRESSIONS   1. Mitral Valve repair: Gore-tex cords to P1, P2, and P3. Magic suture to posterior medial commisure, #30 Physio 1 ring. Done in [place] . Tricuspid Valve repair #28 Triad ring done in [place] . Procedure Date: 01/20/2013. 2. Right ventricular systolic  function is mildly reduced. The right ventricular size is moderately enlarged. There is moderately elevated pulmonary artery systolic pressure. 3. The aortic valve is tricuspid. Aortic valve regurgitation is not visualized. No aortic stenosis is present. 4. Mildly dilated pulmonary artery. 5. Left ventricular ejection fraction, by estimation, is 55 to 60%. Left ventricular ejection fraction by 3D volume is 54 %. The left ventricle has normal function. The left ventricle demonstrates global hypokinesis. Left ventricular diastolic function could not be evaluated. There is the interventricular septum is flattened in systole, consistent with right ventricular pressure overload. 6. Right atrial size was severely dilated. 7. Left atrial size was severely dilated. 8. The tricuspid valve is status post repair with an annuloplasty ring.  Comparison(s): No significant change from prior study. Prior images reviewed side by side.  FINDINGS Left Ventricle: Left ventricular ejection fraction, by estimation, is 55 to 60%. Left ventricular ejection fraction by 3D volume is 54 %. The left ventricle has normal function. The left ventricle demonstrates global hypokinesis. The left ventricular internal cavity size was normal in size. There is no left ventricular hypertrophy. The interventricular septum is flattened in systole, consistent with right ventricular pressure overload. Left ventricular diastolic function could not be evaluated due to mitral valve repair. Left ventricular diastolic function could not be evaluated.  Right Ventricle: The right ventricular size is moderately enlarged. No increase in right ventricular wall thickness. Right ventricular systolic function is mildly reduced. There is moderately elevated pulmonary artery systolic pressure. The tricuspid regurgitant velocity is 3.55 m/s, and with an assumed right atrial pressure of 3 mmHg, the estimated right ventricular systolic pressure is 53.4  mmHg.  Left Atrium: Left atrial size was severely dilated.  Right Atrium: Right atrial size was severely dilated.  Pericardium: There is no evidence of pericardial effusion.  Mitral Valve: The mitral valve has been repaired/replaced. Mild to moderate mitral valve regurgitation, with posteriorly-directed jet. There is a Gore-tex cords to P1, P2, and P3. Magic suture to posterior medial commisure, #30 Physio 1 ring. Done in [place]  present in the mitral position. Procedure Date: 01/20/2013. MV peak gradient, 10.8 mmHg. The mean mitral valve gradient is 3.0 mmHg with average heart rate of 77 bpm.  Tricuspid Valve: The tricuspid valve is normal in structure. Tricuspid valve regurgitation is mild. The tricuspid valve is status post repair with an annuloplasty ring.  Aortic Valve: The aortic valve is tricuspid. Aortic valve regurgitation is not visualized. No aortic stenosis is present.  Pulmonic Valve: The pulmonic valve was normal in structure. Pulmonic valve regurgitation is trivial. No evidence of pulmonic stenosis.  Aorta: The aortic root and ascending aorta are structurally normal, with no evidence of dilitation.  Pulmonary Artery: The pulmonary artery is mildly dilated.  IAS/Shunts: No atrial level shunt detected by color flow Doppler.   LEFT VENTRICLE PLAX 2D LVIDd:         4.50 cm LVIDs:  3.50 cm LV PW:         0.90 cm         3D Volume EF LV IVS:        1.00 cm         LV 3D EF:    Left LVOT diam:     2.20 cm                      ventricul LV SV:         53                           ar LV SV Index:   34                           ejection LVOT Area:     3.80 cm                     fraction by 3D volume is 54 %.  3D Volume EF: 3D EF:        54 % LV EDV:       115 ml LV ESV:       53 ml LV SV:        62 ml  RIGHT VENTRICLE RV Basal diam:  4.30 cm RV Mid diam:    3.50 cm RV S prime:     10.10 cm/s TAPSE (M-mode): 0.9 cm  LEFT ATRIUM             Index         RIGHT ATRIUM           Index LA diam:        4.00 cm 2.54 cm/m   RA Area:     19.00 cm LA Vol (A2C):   80.3 ml 50.96 ml/m  RA Volume:   49.90 ml  31.67 ml/m LA Vol (A4C):   53.1 ml 33.70 ml/m LA Biplane Vol: 69.6 ml 44.17 ml/m AORTIC VALVE LVOT Vmax:   74.80 cm/s LVOT Vmean:  48.300 cm/s LVOT VTI:    0.139 m  AORTA Ao Root diam: 3.20 cm Ao Asc diam:  3.50 cm  MITRAL VALVE              TRICUSPID VALVE MV Area (PHT): 2.65 cm   TV Peak grad:   4.2 mmHg MV Area VTI:   1.46 cm   TV Mean grad:   1.0 mmHg MV Peak grad:  10.8 mmHg  TV Vmax:        1.02 m/s MV Mean grad:  3.0 mmHg   TV Vmean:       51.2 cm/s MV Vmax:       1.64 m/s   TV VTI:         0.23 msec MV Vmean:      77.6 cm/s  TR Peak grad:   50.4 mmHg MR Peak grad: 93.3 mmHg   TR Vmax:        355.00 cm/s MR Mean grad: 60.0 mmHg MR Vmax:      483.00 cm/s SHUNTS MR Vmean:     369.0 cm/s  Systemic VTI:  0.14 m Systemic Diam: 2.20 cm  [name] MD Electronically signed by Jerel Balding MD Signature Date/Time: 02/12/2023/6:13:23 PM    Final          ______________________________________________________________________________________________  Risk Assessment/Calculations  CHA2DS2-VASc Score = 8   This indicates a 10.8% annual risk of stroke. The patient's score is based upon: CHF History: 1 HTN History: 1 Diabetes History: 0 Stroke History: 2 Vascular Disease History: 1 Age Score: 2 Gender Score: 1   Physical Exam VS:  There were no vitals taken for this visit.   Wt Readings from Last 3 Encounters:  09/11/23 122 lb 12.8 oz (55.7 kg)  09/09/23 123 lb (55.8 kg)  08/22/23 124 lb (56.2 kg)    GEN: Well nourished, well developed in no acute distress. Sitting upright in her wheelchair. Thin, elderly female  NECK: No JVD; No carotid bruits CARDIAC:  Irregular rate and rhythm, no murmurs, rubs, gallops RESPIRATORY:  Coarse breath sounds and expiratory wheezing throughout  ABDOMEN: Soft,  non-tender, non-distended EXTREMITIES:  No edema in BLE; No deformity   ASSESSMENT AND PLAN  Chronic HFpEF  Shortness of breath  Cough  - Most recent echocardiogram from 01/2023 showed mildly reduced RV systolic function, EF 55-60%. Previously on lasix  20 mg PRN for weight gain - Patient diagnosed with pneumonia a few weeks ago. After completing antibiotics, continued to have shortness of breath. Called the office 6/18 with elevated BNP. Her nursing facility checked pro bnp that was elevated to >5000. Treated with higher dose lasix  40 mg daily for 2 days following by 20 mg daily  - After being treated with Lasix  20 mg daily, her pro BNP improved to around 2000.  Creatinine stable - Patient has continued to feel short of breath on exertion despite increased Lasix .  She notes that her breathing does not seem to be improving much with Lasix .  She denies orthopnea, lower extremity swelling.  Reviewed her weight logs and her weights have been stable - Patient does not have lower extremity swelling on exam.  Lungs notable for significant expiratory wheezing.  Overall, I am not convinced that her shortness of breath is due to CHF alone.  Suspect she may have some lingering symptoms/shortness of breath after her recent pneumonia  - With significant expiratory wheezing and coarse breath sounds, ordered chest x-ray - Patient reports worsening urinary incontinence on Lasix  to the point where it is interfering with her quality of life.  As she is euvolemic on exam today, weights are stable, and she has not had symptoms okay to cut back to  Lasix  20 mg every other day - Ordered echocardiogram - Her nursing facility has been monitoring her pro BNP and renal function closely. Will not repeat labs today  - Continue breathing treatments per nursing facility   Permanent Atrial Fibrillation  - Echo with severe biatrial dilation  - Rate controlled with metoprolol  succinate 150 mg daily  - Continue eliquis  2.5 mg  BID - dose reduced for age, weight   HTN  - BP well controlled.No symptoms of orthostatic hypotension   HLD  - Lipid panel from 07/2023 showed LDL 44, HDL 53, triglycerides 86, total cholesterol 114  - Continue lipitor 20 mg daily   S/p MV and TV repair - Holding up well on recent echo from 01/2023    Dispo: Follow up in 3-4 weeks   Signed, Rollo FABIENE Louder, PA-C

## 2023-09-12 NOTE — Telephone Encounter (Signed)
-----   Message from Nurse Berneta Brightly sent at 09/12/2023  1:50 PM EDT ----- Regarding: FW: BNP 5297  ----- Message ----- From: Hazle Lites, MD Sent: 09/11/2023   2:58 PM EDT To: Arnetha Bhat, NP; Jeneen Mire, RN; Taques# Subject: RE: BNP 5297                                   Thanks for reaching out - would probably increase lasix  to 40 mg daily - however, given her age and how high her BNP is, may be better served to be evaluated in the hospital. One concern may be degeneration of there mitral valve repair - that could lead to more acute dyspnea and heart failure. She should also have a repeat echo - however, this could be a week or 2 as an outpatient.  Dr. Maximo Spar (For Dr. Tita Form) ----- Message ----- From: Arnetha Bhat, NP Sent: 09/11/2023   1:54 PM EDT To: Luana Rumple, MD; Hazle Lites, MD; Jas# Subject: BNP 5297                                       Good afternoon,  I am the NP at [facility]. Ms. Azzarello has been having increased cough and DOE x 1 week. We had a BNP done yesterday and it was 5297. She is currently taking furosemide  20 mg daily prn for weight gain. No recent weight gain per chart review. No ankle edema. Rales heard on exam. I would normally increase furosemide  for a few days and then start maintenance dose. The daughter who is very involved in her mothers care insisted I reach out to you first for orders. I see you are the covering provider for Dr. Alvis Ba. Any suggestions greatly appreciated.   Thank you for you time,  Ulyses Gandy NP

## 2023-09-13 DIAGNOSIS — E039 Hypothyroidism, unspecified: Secondary | ICD-10-CM | POA: Diagnosis not present

## 2023-09-13 LAB — TSH: TSH: 2.92 (ref 0.41–5.90)

## 2023-09-16 DIAGNOSIS — R41841 Cognitive communication deficit: Secondary | ICD-10-CM | POA: Diagnosis not present

## 2023-09-16 DIAGNOSIS — F01A0 Vascular dementia, mild, without behavioral disturbance, psychotic disturbance, mood disturbance, and anxiety: Secondary | ICD-10-CM | POA: Diagnosis not present

## 2023-09-17 DIAGNOSIS — I509 Heart failure, unspecified: Secondary | ICD-10-CM | POA: Diagnosis not present

## 2023-09-19 DIAGNOSIS — R41841 Cognitive communication deficit: Secondary | ICD-10-CM | POA: Diagnosis not present

## 2023-09-19 DIAGNOSIS — F01A0 Vascular dementia, mild, without behavioral disturbance, psychotic disturbance, mood disturbance, and anxiety: Secondary | ICD-10-CM | POA: Diagnosis not present

## 2023-09-19 DIAGNOSIS — I5022 Chronic systolic (congestive) heart failure: Secondary | ICD-10-CM | POA: Diagnosis not present

## 2023-09-19 LAB — BASIC METABOLIC PANEL WITH GFR
BUN: 34 — AB (ref 4–21)
CO2: 16 (ref 13–22)
Chloride: 98 — AB (ref 99–108)
Creatinine: 1 (ref 0.5–1.1)
Glucose: 78
Potassium: 4.3 meq/L (ref 3.5–5.1)
Sodium: 135 — AB (ref 137–147)

## 2023-09-19 LAB — COMPREHENSIVE METABOLIC PANEL WITH GFR: Calcium: 8.8 (ref 8.7–10.7)

## 2023-09-23 ENCOUNTER — Encounter: Payer: Self-pay | Admitting: Cardiology

## 2023-09-23 ENCOUNTER — Ambulatory Visit (HOSPITAL_COMMUNITY)
Admission: RE | Admit: 2023-09-23 | Discharge: 2023-09-23 | Disposition: A | Discharge status: Home / Self Care | Source: Ambulatory Visit | Attending: Cardiology | Admitting: Cardiology

## 2023-09-23 ENCOUNTER — Ambulatory Visit: Admitting: Cardiology

## 2023-09-23 VITALS — BP 102/64 | HR 91 | Ht 62.0 in | Wt 122.0 lb

## 2023-09-23 DIAGNOSIS — Z9889 Other specified postprocedural states: Secondary | ICD-10-CM | POA: Diagnosis not present

## 2023-09-23 DIAGNOSIS — I10 Essential (primary) hypertension: Secondary | ICD-10-CM | POA: Diagnosis not present

## 2023-09-23 DIAGNOSIS — I5033 Acute on chronic diastolic (congestive) heart failure: Secondary | ICD-10-CM

## 2023-09-23 DIAGNOSIS — I517 Cardiomegaly: Secondary | ICD-10-CM | POA: Diagnosis not present

## 2023-09-23 DIAGNOSIS — E782 Mixed hyperlipidemia: Secondary | ICD-10-CM | POA: Diagnosis not present

## 2023-09-23 DIAGNOSIS — I509 Heart failure, unspecified: Secondary | ICD-10-CM | POA: Diagnosis not present

## 2023-09-23 DIAGNOSIS — R0602 Shortness of breath: Secondary | ICD-10-CM | POA: Diagnosis not present

## 2023-09-23 DIAGNOSIS — I4821 Permanent atrial fibrillation: Secondary | ICD-10-CM | POA: Diagnosis not present

## 2023-09-23 DIAGNOSIS — I5032 Chronic diastolic (congestive) heart failure: Secondary | ICD-10-CM | POA: Insufficient documentation

## 2023-09-23 DIAGNOSIS — R918 Other nonspecific abnormal finding of lung field: Secondary | ICD-10-CM | POA: Diagnosis not present

## 2023-09-23 MED ORDER — FUROSEMIDE 20 MG PO TABS: 20.0000 mg | ORAL_TABLET | ORAL | 3 refills | Status: DC

## 2023-09-23 NOTE — Patient Instructions (Signed)
 Medication Instructions:  Take Lasix  20 mg once every other day  *If you need a refill on your cardiac medications before your next appointment, please call your pharmacy*  Lab Work: No labs If you have labs (blood work) drawn today and your tests are completely normal, you will receive your results only by: MyChart Message (if you have MyChart) OR A paper copy in the mail If you have any lab test that is abnormal or we need to change your treatment, we will call you to review the results.  Testing/Procedures: Your physician has requested that you have an echocardiogram. Echocardiography is a painless test that uses sound waves to create images of your heart. It provides your doctor with information about the size and shape of your heart and how well your heart's chambers and valves are working. This procedure takes approximately one hour. There are no restrictions for this procedure. Please do NOT wear cologne, perfume, aftershave, or lotions (deodorant is allowed). Please arrive 15 minutes prior to your appointment time.  Please note: We ask at that you not bring children with you during ultrasound (echo/ vascular) testing. Due to room size and safety concerns, children are not allowed in the ultrasound rooms during exams. Our front office staff cannot provide observation of children in our lobby area while testing is being conducted. An adult accompanying a patient to their appointment will only be allowed in the ultrasound room at the discretion of the ultrasound technician under special circumstances. We apologize for any inconvenience.  A chest x-ray takes a picture of the organs and structures inside the chest, including the heart, lungs, and blood vessels. This test can show several things, including, whether the heart is enlarges; whether fluid is building up in the lungs; and whether pacemaker / defibrillator leads are still in place. Follow-Up: At [facility], you and your  health needs are our priority.  As part of our continuing mission to provide you with exceptional heart care, our providers are all part of one team.  This team includes your primary Cardiologist (physician) and Advanced Practice Providers or APPs (Physician Assistants and Nurse Practitioners) who all work together to provide you with the care you need, when you need it.  Your next appointment:   After X-Ray or 3-4 weeks   Provider:   Rollo Louder, PA-C  We recommend signing up for the patient portal called MyChart.  Sign up information is provided on this After Visit Summary.  MyChart is used to connect with patients for Virtual Visits (Telemedicine).  Patients are able to view lab/test results, encounter notes, upcoming appointments, etc.  Non-urgent messages can be sent to your provider as well.   To learn more about what you can do with MyChart, go to [email or web address].

## 2023-09-24 ENCOUNTER — Other Ambulatory Visit (HOSPITAL_BASED_OUTPATIENT_CLINIC_OR_DEPARTMENT_OTHER)

## 2023-09-24 DIAGNOSIS — R41841 Cognitive communication deficit: Secondary | ICD-10-CM | POA: Diagnosis not present

## 2023-09-24 DIAGNOSIS — R2689 Other abnormalities of gait and mobility: Secondary | ICD-10-CM | POA: Diagnosis not present

## 2023-09-24 DIAGNOSIS — I5032 Chronic diastolic (congestive) heart failure: Secondary | ICD-10-CM

## 2023-09-24 DIAGNOSIS — F01A0 Vascular dementia, mild, without behavioral disturbance, psychotic disturbance, mood disturbance, and anxiety: Secondary | ICD-10-CM | POA: Diagnosis not present

## 2023-09-24 LAB — ECHOCARDIOGRAM COMPLETE
Area-P 1/2: 4.68 cm2
MV M vel: 4.41 m/s
MV Peak grad: 77.8 mmHg
Radius: 0.7 cm
S' Lateral: 2.39 cm

## 2023-09-25 ENCOUNTER — Telehealth: Payer: Self-pay | Admitting: Cardiology

## 2023-09-25 NOTE — Telephone Encounter (Signed)
 Patient's daughter called the office this morning asking for an update on patient's checks x-ray.  Chest x-ray has not yet been formally read by the cardiologist.  I reviewed images and patient's case with Dr. Francyne.  After looking at the chest x-ray, it appears that she does have a bit of extra fluid in her lungs.  Recommended 40 mg Lasix  for 2-3 days followed by her going back down to Lasix  20 mg as needed.  I discussed this with patient's primary care Dr. Charlanne at her nursing facility.  Recommended having patient take Lasix  40 mg for the next 2 days and repeating BMP after this.  Afterwards, okay to reduce Lasix  to 20 mg as needed.  Dr. Charlanne concerned that Lasix  20 mg as needed was not effective in controlling patient's symptoms.  She suspects that she might need a scheduled dose of Lasix .  Yesterday during appointment, patient's daughter did mention that her weight often does not meet the requirements for as needed Lasix .  After discussion, we agreed that patient should take Lasix  20 mg as needed after taking Lasix  40 mg for 2 days  I also called patient's daughter to discuss.  Patient's daughter is in agreement with plan.  However, reports that they are currently on the road traveling to [place]  to visit family.  Daughter asked if it was reasonable to wait until patient got back on Monday 7/7 to increase Lasix  dose.  Discussed that patient will likely continue to have some shortness of breath until she takes higher dose of Lasix , but it is reasonable to wait until she is back home given patient's significant urinary and fecal incontinence with lasix . Discussed with daughter that patient should take 20 mg lasix  every other day while traveling. If she has worsening shortness of breath, weight gain, lower extremity swelling, she should give an additional 20 mg of lasix  (so 40 mg total). When patient returns 7/7, take lasix  40 mg daily for 2 days. Then, lasix  20 mg every other day   Rollo FABIENE Louder,  PA-C 09/25/2023 10:30 AM

## 2023-09-30 ENCOUNTER — Encounter: Payer: Self-pay | Admitting: Orthopedic Surgery

## 2023-09-30 ENCOUNTER — Ambulatory Visit: Payer: Self-pay | Admitting: Cardiology

## 2023-09-30 DIAGNOSIS — F01A0 Vascular dementia, mild, without behavioral disturbance, psychotic disturbance, mood disturbance, and anxiety: Secondary | ICD-10-CM | POA: Diagnosis not present

## 2023-09-30 DIAGNOSIS — R41841 Cognitive communication deficit: Secondary | ICD-10-CM | POA: Diagnosis not present

## 2023-09-30 DIAGNOSIS — R2689 Other abnormalities of gait and mobility: Secondary | ICD-10-CM | POA: Diagnosis not present

## 2023-10-01 ENCOUNTER — Non-Acute Institutional Stay (SKILLED_NURSING_FACILITY): Payer: Self-pay | Admitting: Orthopedic Surgery

## 2023-10-01 ENCOUNTER — Encounter: Payer: Self-pay | Admitting: Orthopedic Surgery

## 2023-10-01 DIAGNOSIS — Z7189 Other specified counseling: Secondary | ICD-10-CM

## 2023-10-01 NOTE — Progress Notes (Unsigned)
 [facility] Room Number: 129-A Place of Service:  SNF [phone]) Provider:  Greig Cluster, NP  PCP: Charlanne Fredia CROME, MD  Patient Care Team: Charlanne Fredia CROME, MD as PCP - General (Internal Medicine) Croitoru, Jerel, MD as PCP - Cardiology (Cardiology) [name], DO as Consulting Physician (Neurology)  Extended Emergency Contact Information Primary Emergency Contact: [name],[name] Address: [address]          [place], [place] [address] [place] Home Phone: [phone] Mobile Phone: [phone] Relation: Daughter Secondary Emergency Contact: [name] Address: [address]          [place], [place] [address] [place] Phone: [phone] Relation: Son  Code Status:  DNR Goals of care: Advanced Directive information    10/01/2023   10:56 AM  Advanced Directives  Does Patient Have a Medical Advance Directive? Yes  Type of Estate agent of [place];Living will;Out of facility DNR (pink MOST or yellow form)  Does patient want to make changes to medical advance directive? No - Patient declined  Copy of Healthcare Power of Attorney in Chart? Yes - validated most recent copy scanned in chart (See row information)     Chief Complaint  Patient presents with   Form Completion    Discuss MOST form.     HPI:  Pt is a 89 y.o. female seen today for an acute visit for    Past Medical History:  Diagnosis Date   Allergy    seasonal   Anemia    Anxiety    Arthritis    Back   Asymptomatic menopausal state    Atrial fibrillation (HCC)    Cancer (HCC) 2006   Colon.  Basal Cell Skin cancer- right arm   Cataract    removed bilateral   Chronic atrial fibrillation (HCC)    Colon cancer (HCC)    Constipation due to pain medication therapy    after heart surgery   Deficiency of other specified B group vitamins    Diplopia    Disorientation, unspecified    Dysrhythmia    PAF    GERD (gastroesophageal reflux disease)    Heart murmur    Hyperlipidemia    Hypertension    Hypothyroidism    Malignant neoplasm of colon, unspecified (HCC)    Other specified diseases of blood and blood-forming organs    Other specified disorders of bone density and structure, unspecified forearm    Other thrombophilia (HCC)    per matrix   Overactive bladder    per Matrix   Personal history of other malignant neoplasm of large intestine    Presbycusis, bilateral    per matrix   RA (rheumatoid arthritis) (HCC)    Restless leg    Seizures (HCC)    after Heart Surgey   Stroke (HCC)    TIA- found by neurologist after    Valgus deformity, not elsewhere classified, right knee    per matrix   Vascular dementia, mild, without behavioral disturbance, psychotic disturbance, mood disturbance, and anxiety (HCC)    per matrix   Past Surgical History:  Procedure Laterality Date   ABDOMINAL HYSTERECTOMY  1970   Partial    COLON RESECTION  2006   cancer   COLON SURGERY     COLONOSCOPY     EYE SURGERY Bilateral    Cataract   MAXIMUM ACCESS (MAS)POSTERIOR LUMBAR INTERBODY FUSION (PLIF) 2 LEVEL N/A 04/12/2015   Procedure: Lumbar  Three-Five Decompression, Pedicle Screw Fixation, and Posteriolateral Arthrodesis;  Surgeon: Fairy Levels, MD;  Location: [facility];  Service: Neurosurgery;  Laterality: N/A;  L3-4 L4-5 Maximum access posterior lumbar fusion, possible interbodies and resection of synovial cyst at L4-5   MITRAL VALVE REPAIR  01/20/2013   Gore-tex cords to P1, P2, and P3. Magic suture to posterior medial commisure, #30 Physio 1 ring. Done in [place]    TONSILLECTOMY     about 1940   TRICUSPID VALVE SURGERY  01/20/2013   #28 TriAd ring done in [place]     Allergies  Allergen Reactions   Claritin [Loratadine] Other (See Comments)    Listed on MAR Unknown reaction   Codeine Other (See Comments)    just don't take it well   Gabapentin  Other (See Comments)    Dizziness     Molds & Smuts Other (See Comments)    Dust.  Reaction is not listed on MAR    Pollen Extract Swelling    Grass   Bee Venom Swelling and Rash   Wasp Venom Swelling and Rash    Allergies as of 10/01/2023       Reactions   Claritin [loratadine] Other (See Comments)   Listed on MAR Unknown reaction   Codeine Other (See Comments)   just don't take it well   Gabapentin  Other (See Comments)   Dizziness    Molds & Smuts Other (See Comments)   Dust.  Reaction is not listed on MAR    Pollen Extract Swelling   Grass   Bee Venom Swelling, Rash   Wasp Venom Swelling, Rash        Medication List        Accurate as of October 01, 2023 11:02 AM. If you have any questions, ask your nurse or doctor.          acetaminophen  500 MG tablet Commonly known as: TYLENOL  Take 1,000 mg by mouth in the morning and at bedtime.   amoxicillin 500 MG capsule Commonly known as: AMOXIL Take 2,000 mg by mouth as needed.   apixaban  2.5 MG Tabs tablet Commonly known as: ELIQUIS  Take 1 tablet (2.5 mg total) by mouth 2 (two) times daily.   atorvastatin  20 MG tablet Commonly known as: LIPITOR TAKE 1 TABLET DAILY   cloNIDine  0.1 MG tablet Commonly known as: CATAPRES  Take 1 tablet (0.1 mg total) by mouth daily as needed. If SBP >180 only   cyanocobalamin  1000 MCG tablet Commonly known as: VITAMIN B12 Take 1,000 mcg by mouth in the morning.   Enbrel SureClick 50 MG/ML injection Generic drug: etanercept Inject 50 mg into the skin once a week.   furosemide  20 MG tablet Commonly known as: LASIX  Take 20 mg by mouth as needed.   furosemide  20 MG tablet Commonly known as: LASIX  Take 1 tablet (20 mg total) by mouth every other day.   Gemtesa  75 MG Tabs Generic drug: Vibegron  Take 75 mg by mouth at bedtime.   Iron  (Ferrous Sulfate ) 325 (65 Fe) MG Tabs Take 325 mg by mouth daily.   leflunomide  20 MG tablet Commonly known as: ARAVA  Take 20 mg by mouth in the morning.   levalbuterol  0.63  MG/3ML nebulizer solution Commonly known as: Xopenex  Take 3 mLs (0.63 mg total) by nebulization every 6 (six) hours as needed for wheezing or shortness of breath.   levothyroxine  88 MCG tablet Commonly known as: SYNTHROID  Take 88 mcg by mouth daily before breakfast.   METOPROLOL  SUCCINATE PO Take 150 mg  by mouth in the morning.   mirtazapine  15 MG tablet Commonly known as: REMERON  TAKE 1 TABLET AT BEDTIME   MULTIVITAMIN ADULT PO Take 1 tablet by mouth every morning.   omeprazole  40 MG capsule Commonly known as: PRILOSEC TAKE 1 CAPSULE DAILY   PreserVision AREDS 2 Caps Take 1 capsule by mouth in the morning and at bedtime.   valsartan  160 MG tablet Commonly known as: DIOVAN  Take 160 mg by mouth daily.   Vitamin D  50 MCG (2000 UT) Caps Take 2,000 Units by mouth in the morning.        Review of Systems  Immunization History  Administered Date(s) Administered   Fluad Quad(high Dose 65+) 01/05/2019, 12/07/2021, 01/15/2023   Fluad Trivalent(High Dose 65+) 01/15/2023   H1N1 04/07/2008   Influenza Whole 12/29/2008, 12/22/2009   Influenza, High Dose Seasonal PF 01/15/2012, 12/23/2014, 01/22/2020   Influenza,inj,Quad PF,6+ Mos 01/22/2014, 12/10/2016, 01/17/2018   Influenza,trivalent, recombinat, inj, PF 01/05/2016   Influenza-Unspecified 04/07/2008, 12/15/2014, 01/13/2021   Moderna Covid-19 Vaccine  Bivalent Booster 74yrs & up 01/29/2022, 01/15/2023   Moderna Sars-Covid-2 Vaccination 04/07/2019, 05/05/2019, 02/09/2020   PFIZER(Purple Top)SARS-COV-2 Vaccination 01/06/2021   PPD Test 07/17/2010, 04/19/2015, 04/25/2015   Pfizer Covid-19 Vaccine Bivalent Booster 100yrs & up 07/19/2022   Pneumococcal Conjugate-13 11/10/2013   Pneumococcal Polysaccharide-23 12/27/2010, 12/05/2016   RSV,unspecified 03/28/2022   Tdap 04/15/2017   Zoster Recombinant(Shingrix) 04/09/2017, 06/18/2017   Pertinent  Health Maintenance Due  Topic Date Due   INFLUENZA VACCINE  10/25/2023   DEXA SCAN   Completed      05/21/2022    1:06 PM 06/06/2022    4:55 PM 07/09/2022    1:44 PM 08/29/2022    3:31 PM 04/18/2023   12:52 PM  Fall Risk  Falls in the past year? 1 1 0 1 0  Was there an injury with Fall? 1 1 0 1 0  Fall Risk Category Calculator 3 3 0 2 0  Patient at Risk for Falls Due to History of fall(s);Impaired balance/gait;Impaired mobility History of fall(s);Impaired balance/gait;Impaired mobility History of fall(s);Impaired balance/gait;Impaired mobility    Fall risk Follow up Falls evaluation completed Falls evaluation completed;Education provided;Falls prevention discussed Falls evaluation completed Falls evaluation completed Falls evaluation completed   Functional Status Survey:    Vitals:   10/01/23 1052  BP: 113/80  Pulse: 95  Resp: 20  Temp: 98.2 F (36.8 C)  SpO2: 92%  Weight: 122 lb 1.6 oz (55.4 kg)  Height: 5' 2 (1.575 m)   Body mass index is 22.33 kg/m. Physical Exam  Labs reviewed: Recent Labs    02/05/23 1146 08/02/23 0000 09/19/23 0000  NA 138 133* 135*  K 4.1 4.2 4.3  CL 104 99 98*  CO2 24 23* 16  GLUCOSE 96  --   --   BUN 34* 21 34*  CREATININE 1.15* 0.9 1.0  CALCIUM  9.8 9.2 8.8   Recent Labs    10/18/22 0000 12/31/22 0000 02/05/23 1146 08/02/23 0000  AST 17 26 18 25   ALT 16 36* 19 22  ALKPHOS 90 104 91 118  BILITOT  --   --  0.8  --   PROT  --   --  7.1  --   ALBUMIN 4.4 4.1 4.1  --    Recent Labs    10/11/22 1405 10/18/22 0000 12/31/22 0000 02/05/23 1146 04/04/23 0000 08/02/23 0000 08/13/23 1130  WBC 9.9   < > 6.4 7.6 5.8 5.8 7.6  NEUTROABS 5.7   < > 3.70  4.5  --   --  4.3  HGB 11.9*   < > 10.6* 11.5* 11.0* 11.2* 11.8*  HCT 35.6*   < > 31* 34.6* 33* 34* 35.0*  MCV 102.6*  --   --  102.4*  --   --  103.6*  PLT 193   < > 157 163 144* 167 177   < > = values in this interval not displayed.   Lab Results  Component Value Date   TSH 2.92 09/13/2023   Lab Results  Component Value Date   HGBA1C 5.5 01/12/2013   Lab  Results  Component Value Date   CHOL 114 08/02/2023   HDL 53 08/02/2023   LDLCALC 44 08/02/2023   TRIG 86 08/02/2023    Significant Diagnostic Results in last 30 days:  ECHOCARDIOGRAM COMPLETE Result Date: 09/24/2023    ECHOCARDIOGRAM REPORT   [name] Bogus Date of Exam: 09/24/2023 Medical Rec #:  [id number]   Height:       62.0 in Accession #:    [id number]  Weight:       122.0 lb Date of Birth:  09-13-1931   BSA:          1.549 m Patient Age:    89 years    BP:           102/64 mmHg Patient Gender: F           HR:           98 bpm. Exam Location:  Outpatient Procedure: 2D Echo, 3D Echo, Cardiac Doppler, Color Doppler and Strain Analysis            (Both Spectral and Color Flow Doppler were utilized during            procedure). Indications:    CHF  History:        Patient has prior history of Echocardiogram examinations, most                 recent 02/12/2023. TIA and Stroke, Arrythmias:Atrial                 Fibrillation and Atrial Flutter, Signs/Symptoms:Shortness of                 Breath; Risk Factors:Hypertension, Non-Smoker and Dyslipidemia.                 MV #30 Physio Ring; TV # 28 TriAd Ring 2014, [place];Chronic                 HFpEF.                  Mitral Valve: 30 mm prosthetic annuloplasty ring valve is                 present in the mitral position. Procedure Date: 01/20/2013.  Sonographer:    Orvil Holmes RDCS Referring Phys: [id number] ROLLO JONELLE LOUDER IMPRESSIONS  1. Left ventricular ejection fraction, by estimation, is 45 to 50%. Left ventricular ejection fraction by 3D volume is 46 %. The left ventricle has mildly decreased function. The left ventricle demonstrates global hypokinesis. There is mild left ventricular hypertrophy. Left ventricular diastolic parameters are indeterminate.  2. Right ventricular systolic function is normal. The right ventricular size is moderately enlarged.  3. Left atrial size was severely dilated.  4. Right atrial size was mildly dilated.  5.  The mitral valve has been repaired/replaced. Mild mitral valve regurgitation. No evidence of mitral  stenosis. There is a 30 mm prosthetic annuloplasty ring present in the mitral position. Procedure Date: 01/20/2013.  6. The tricuspid valve is has been repaired/replaced. The tricuspid valve is status post repair with an annuloplasty ring. Tricuspid valve regurgitation is mild to moderate.  7. The aortic valve is tricuspid. Aortic valve regurgitation is not visualized. No aortic stenosis is present.  8. The inferior vena cava is normal in size with greater than 50% respiratory variability, suggesting right atrial pressure of 3 mmHg. FINDINGS  Left Ventricle: Left ventricular ejection fraction, by estimation, is 45 to 50%. Left ventricular ejection fraction by 3D volume is 46 %. The left ventricle has mildly decreased function. The left ventricle demonstrates global hypokinesis. Global longitudinal strain performed but not reported based on interpreter judgement due to suboptimal tracking. The left ventricular internal cavity size was normal in size. There is mild left ventricular hypertrophy. Left ventricular diastolic parameters are indeterminate. Right Ventricle: The right ventricular size is moderately enlarged. No increase in right ventricular wall thickness. Right ventricular systolic function is normal. Left Atrium: Left atrial size was severely dilated. Right Atrium: Right atrial size was mildly dilated. Pericardium: There is no evidence of pericardial effusion. Mitral Valve: The mitral valve has been repaired/replaced. Mild mitral valve regurgitation. There is a 30 mm prosthetic annuloplasty ring present in the mitral position. Procedure Date: 01/20/2013. No evidence of mitral valve stenosis. Tricuspid Valve: The tricuspid valve is has been repaired/replaced. Tricuspid valve regurgitation is mild to moderate. No evidence of tricuspid stenosis. The tricuspid valve is status post repair with an annuloplasty  ring. Aortic Valve: The aortic valve is tricuspid. Aortic valve regurgitation is not visualized. No aortic stenosis is present. Pulmonic Valve: The pulmonic valve was normal in structure. Pulmonic valve regurgitation is not visualized. No evidence of pulmonic stenosis. Aorta: The aortic root is normal in size and structure. Venous: The inferior vena cava is normal in size with greater than 50% respiratory variability, suggesting right atrial pressure of 3 mmHg. IAS/Shunts: No atrial level shunt detected by color flow Doppler. Additional Comments: 3D was performed not requiring image post processing on an independent workstation and was abnormal.  LEFT VENTRICLE PLAX 2D LVIDd:         3.67 cm         Diastology LVIDs:         2.39 cm         LV e' medial:    4.14 cm/s LV PW:         1.30 cm         LV E/e' medial:  30.7 LV IVS:        0.98 cm         LV e' lateral:   8.19 cm/s LVOT diam:     2.00 cm         LV E/e' lateral: 15.5 LV SV:         28 LV SV Index:   18 LVOT Area:     3.14 cm        3D Volume EF                                LV 3D EF:    Left  ventricul                                             ar                                             ejection                                             fraction                                             by 3D                                             volume is                                             46 %.                                 3D Volume EF:                                3D EF:        46 %                                LV EDV:       91 ml                                LV ESV:       49 ml                                LV SV:        42 ml RIGHT VENTRICLE RV Basal diam:  5.10 cm RV Mid diam:    3.46 cm RV S prime:     6.24 cm/s TAPSE (M-mode): 0.8 cm LEFT ATRIUM             Index        RIGHT ATRIUM           Index LA diam:        5.20 cm 3.36 cm/m   RA Area:     17.40 cm LA Vol (A2C):   87.2 ml 56.28  ml/m  RA Volume:   48.80 ml  31.49 ml/m LA Vol (A4C):   82.6 ml 53.31 ml/m LA Biplane Vol: 84.8 ml 54.73 ml/m  AORTIC  VALVE LVOT Vmax:   60.30 cm/s LVOT Vmean:  39.500 cm/s LVOT VTI:    0.090 m  AORTA Ao Root diam: 3.20 cm Ao Asc diam:  3.60 cm MITRAL VALVE                  TRICUSPID VALVE MV Area (PHT): 4.68 cm       TR Peak grad:   50.7 mmHg MV Decel Time: 162 msec       TR Vmax:        356.00 cm/s MR Peak grad:    77.8 mmHg MR Mean grad:    53.0 mmHg    SHUNTS MR Vmax:         441.00 cm/s  Systemic VTI:  0.09 m MR Vmean:        347.0 cm/s   Systemic Diam: 2.00 cm MR PISA:         3.08 cm MR PISA Eff ROA: 22 mm MR PISA Radius:  0.70 cm MV E velocity: 127.00 cm/s MV A velocity: 48.40 cm/s MV E/A ratio:  2.62 Oneil Parchment MD Electronically signed by Oneil Parchment MD Signature Date/Time: 09/24/2023/12:05:21 PM    Final     Assessment/Plan There are no diagnoses linked to this encounter.   Family/ staff Communication:   Labs/tests ordered:

## 2023-10-01 NOTE — Telephone Encounter (Signed)
-----   Message from Rollo FABIENE Louder sent at 09/30/2023  1:33 PM EDT ----- Please tell patient that the echocardiogram showed EF (pump strength of the heart) is a bit weaker than on previous echocardiogram. EF was previously 55% in 01/2023, is now in the 45-50% range. This  is only mildly decreased. RV functions normally. Left atrium (top left chamber of the heart) is dilated. This is due to her permanent atrial fibrillation.   The mitral valve is mildly leaky. This is unchanged compared to echo in 01/2023. Tricuspid valve is also mildly leaky. We will monitor this in the future.   No changes to treatment plan at this time   Thanks KJ  ----- Message ----- From: Interface, [employer] One Seven Sent: 09/24/2023  12:05 PM EDT To: Rollo JONELLE Louder, PA-C

## 2023-10-01 NOTE — Telephone Encounter (Signed)
 Called patient advised of below they verbalized understanding.

## 2023-10-02 DIAGNOSIS — I5032 Chronic diastolic (congestive) heart failure: Secondary | ICD-10-CM | POA: Diagnosis not present

## 2023-10-02 LAB — BASIC METABOLIC PANEL WITH GFR
BUN: 32 — AB (ref 4–21)
CO2: 20 (ref 13–22)
Chloride: 97 — AB (ref 99–108)
Creatinine: 1.2 — AB (ref 0.5–1.1)
Glucose: 73
Potassium: 4.3 meq/L (ref 3.5–5.1)
Sodium: 134 — AB (ref 137–147)

## 2023-10-02 LAB — COMPREHENSIVE METABOLIC PANEL WITH GFR
Calcium: 9.1 (ref 8.7–10.7)
eGFR: 42

## 2023-10-03 DIAGNOSIS — F01A0 Vascular dementia, mild, without behavioral disturbance, psychotic disturbance, mood disturbance, and anxiety: Secondary | ICD-10-CM | POA: Diagnosis not present

## 2023-10-03 DIAGNOSIS — R2689 Other abnormalities of gait and mobility: Secondary | ICD-10-CM | POA: Diagnosis not present

## 2023-10-03 DIAGNOSIS — R41841 Cognitive communication deficit: Secondary | ICD-10-CM | POA: Diagnosis not present

## 2023-10-03 NOTE — Progress Notes (Signed)
 Cardiology Office Note   Date:  10/17/2023  ID:  Kathy Velez, DOB [birth year], MRN [id number] PCP: Charlanne Fredia CROME, MD  [facility] Providers Cardiologist:  Jerel Balding, MD   History of Present Illness Kathy Velez is a 89 y.o. female  with a pat medical history of paroxysmal atrial fibrillation, history of CVA, vascular dementia, carotid artery disease, aortic atherosclerosis, HTN, history of mitral and tricuspid valve repair (MV #30 Physio Ring; TV # 28 TriAd Ring 2014, [place]), chronic interstitial lung disease, history of colon cancer in remission, GERD, treated hypothyroidism, hyponatremia (probable SIADH). Patient lives at [facility], a skilled nursing facility. Presents today for elevated BNP.    Patient's most recent echocardiogram from 02/12/23 showed LVEF 55-60%, mildly reduced RV systolic function. Severe bi atrial dilation. S/p mitral valve and tricuspid valve repairs. No significant changes from prior study. Patient has history of hyponatremia and takes salt tablets. Afib is rate controlled with beta-blockers and she is on eliquis  for stroke prevention. She takes lasix  as needed for weight gain.    Patient's nursing facility contacted the office on 6/18. Her BNP was elevated to >5000. Her oxygen sats were good, so she was treated with higher dose of lasix  for several days. Appointment was arranged for follow up. I saw her in clinic on 6/30. She had ongoing shortness of breath. Denied orthopnea, weight gain, swelling. She had urinary and fecal incontinence with lasix  and did not like taking it. WE obtained a CXR that showed interstitial thickening, possible chronic lung disease vs pulmonary edema. Also showed an ill-defined opacity at the right lung base, possible ateletasis or pneumonia. As patient had already completed a course of antibiotics for pneumonia, likely this was atelectasis or infiltrate. Discussed with her PCP Dr. Charlanne who will repeat CXR in 3 months to ensure  infiltrate has resolved. Her lasix  was increased to 40 mg for 2 days, then 20 mg every other day    Echocardiogram 09/24/23 showed EF 45-50%, mild LVH, normal RV systolic function, mild MR, mild-moderate TR.   Today, patient presents accompanied by her daughter. Reports that her breathing has improved since last being seen. Occasionally has episodes of shortness of breath, but she does not feel like this interferes with her daily activities. She denies orthopnea. Cough has improved. No palpitations, dizziness. Has mild lower extremity swelling. She has been on lasix  every other day. Has episodes of urinary hesitancy. Often feels like she has to urinate, but then she cannot. She has been on Gemtesa  for overactive bladder and wonders if this is contributing. She does exercise classes at her living facility to stay active. She tries to eat healthy and eats a lot of salads.   Studies Reviewed Cardiac Studies & Procedures   ______________________________________________________________________________________________     ECHOCARDIOGRAM  ECHOCARDIOGRAM COMPLETE 09/24/2023  Narrative ECHOCARDIOGRAM REPORT    [name] Date of Exam: 09/24/2023 Medical Rec #:  [id number]   Height:       62.0 in Accession #:    [id number]  Weight:       122.0 lb Date of Birth:  09-20-1931   BSA:          1.549 m Patient Age:    89 years    BP:           102/64 mmHg Patient Gender: F           HR:           98 bpm. Exam Location:  Outpatient  Procedure: 2D Echo, 3D Echo, Cardiac Doppler, Color Doppler and Strain Analysis (Both Spectral and Color Flow Doppler were utilized during procedure).  Indications:    CHF  History:        Patient has prior history of Echocardiogram examinations, most recent 02/12/2023. TIA and Stroke, Arrythmias:Atrial Fibrillation and Atrial Flutter, Signs/Symptoms:Shortness of Breath; Risk Factors:Hypertension, Non-Smoker and Dyslipidemia. MV #30 Physio Ring; TV # 28  TriAd Ring 2014, [place];Chronic HFpEF.  Mitral Valve: 30 mm prosthetic annuloplasty ring valve is present in the mitral position. Procedure Date: 01/20/2013.  Sonographer:    Orvil Holmes RDCS Referring Phys: [id number] ROLLO JONELLE LOUDER  IMPRESSIONS   1. Left ventricular ejection fraction, by estimation, is 45 to 50%. Left ventricular ejection fraction by 3D volume is 46 %. The left ventricle has mildly decreased function. The left ventricle demonstrates global hypokinesis. There is mild left ventricular hypertrophy. Left ventricular diastolic parameters are indeterminate. 2. Right ventricular systolic function is normal. The right ventricular size is moderately enlarged. 3. Left atrial size was severely dilated. 4. Right atrial size was mildly dilated. 5. The mitral valve has been repaired/replaced. Mild mitral valve regurgitation. No evidence of mitral stenosis. There is a 30 mm prosthetic annuloplasty ring present in the mitral position. Procedure Date: 01/20/2013. 6. The tricuspid valve is has been repaired/replaced. The tricuspid valve is status post repair with an annuloplasty ring. Tricuspid valve regurgitation is mild to moderate. 7. The aortic valve is tricuspid. Aortic valve regurgitation is not visualized. No aortic stenosis is present. 8. The inferior vena cava is normal in size with greater than 50% respiratory variability, suggesting right atrial pressure of 3 mmHg.  FINDINGS Left Ventricle: Left ventricular ejection fraction, by estimation, is 45 to 50%. Left ventricular ejection fraction by 3D volume is 46 %. The left ventricle has mildly decreased function. The left ventricle demonstrates global hypokinesis. Global longitudinal strain performed but not reported based on interpreter judgement due to suboptimal tracking. The left ventricular internal cavity size was normal in size. There is mild left ventricular hypertrophy. Left ventricular diastolic parameters  are indeterminate.  Right Ventricle: The right ventricular size is moderately enlarged. No increase in right ventricular wall thickness. Right ventricular systolic function is normal.  Left Atrium: Left atrial size was severely dilated.  Right Atrium: Right atrial size was mildly dilated.  Pericardium: There is no evidence of pericardial effusion.  Mitral Valve: The mitral valve has been repaired/replaced. Mild mitral valve regurgitation. There is a 30 mm prosthetic annuloplasty ring present in the mitral position. Procedure Date: 01/20/2013. No evidence of mitral valve stenosis.  Tricuspid Valve: The tricuspid valve is has been repaired/replaced. Tricuspid valve regurgitation is mild to moderate. No evidence of tricuspid stenosis. The tricuspid valve is status post repair with an annuloplasty ring.  Aortic Valve: The aortic valve is tricuspid. Aortic valve regurgitation is not visualized. No aortic stenosis is present.  Pulmonic Valve: The pulmonic valve was normal in structure. Pulmonic valve regurgitation is not visualized. No evidence of pulmonic stenosis.  Aorta: The aortic root is normal in size and structure.  Venous: The inferior vena cava is normal in size with greater than 50% respiratory variability, suggesting right atrial pressure of 3 mmHg.  IAS/Shunts: No atrial level shunt detected by color flow Doppler.  Additional Comments: 3D was performed not requiring image post processing on an independent workstation and was abnormal.   LEFT VENTRICLE PLAX 2D LVIDd:         3.67 cm  Diastology LVIDs:         2.39 cm         LV e' medial:    4.14 cm/s LV PW:         1.30 cm         LV E/e' medial:  30.7 LV IVS:        0.98 cm         LV e' lateral:   8.19 cm/s LVOT diam:     2.00 cm         LV E/e' lateral: 15.5 LV SV:         28 LV SV Index:   18 LVOT Area:     3.14 cm        3D Volume EF LV 3D EF:    Left ventricul ar ejection fraction by 3D volume is 46  %.  3D Volume EF: 3D EF:        46 % LV EDV:       91 ml LV ESV:       49 ml LV SV:        42 ml  RIGHT VENTRICLE RV Basal diam:  5.10 cm RV Mid diam:    3.46 cm RV S prime:     6.24 cm/s TAPSE (M-mode): 0.8 cm  LEFT ATRIUM             Index        RIGHT ATRIUM           Index LA diam:        5.20 cm 3.36 cm/m   RA Area:     17.40 cm LA Vol (A2C):   87.2 ml 56.28 ml/m  RA Volume:   48.80 ml  31.49 ml/m LA Vol (A4C):   82.6 ml 53.31 ml/m LA Biplane Vol: 84.8 ml 54.73 ml/m AORTIC VALVE LVOT Vmax:   60.30 cm/s LVOT Vmean:  39.500 cm/s LVOT VTI:    0.090 m  AORTA Ao Root diam: 3.20 cm Ao Asc diam:  3.60 cm  MITRAL VALVE                  TRICUSPID VALVE MV Area (PHT): 4.68 cm       TR Peak grad:   50.7 mmHg MV Decel Time: 162 msec       TR Vmax:        356.00 cm/s MR Peak grad:    77.8 mmHg MR Mean grad:    53.0 mmHg    SHUNTS MR Vmax:         441.00 cm/s  Systemic VTI:  0.09 m MR Vmean:        347.0 cm/s   Systemic Diam: 2.00 cm MR PISA:         3.08 cm MR PISA Eff ROA: 22 mm MR PISA Radius:  0.70 cm MV E velocity: 127.00 cm/s MV A velocity: 48.40 cm/s MV E/A ratio:  2.62  Oneil Parchment MD Electronically signed by Oneil Parchment MD Signature Date/Time: 09/24/2023/12:05:21 PM    Final          ______________________________________________________________________________________________       Risk Assessment/Calculations  CHA2DS2-VASc Score = 8  This indicates a 10.8% annual risk of stroke. The patient's score is based upon: CHF History: 1 HTN History: 1 Diabetes History: 0 Stroke History: 2 Vascular Disease History: 1 Age Score: 2 Gender Score: 1      Physical Exam VS:  BP 113/67  Pulse 81   Ht 5' 2 (1.575 m)   Wt 125 lb 9.6 oz (57 kg)   SpO2 96%   BMI 22.97 kg/m        Wt Readings from Last 3 Encounters:  10/17/23 125 lb 9.6 oz (57 kg)  10/04/23 118 lb (53.5 kg)  10/04/23 118 lb 3.2 oz (53.6 kg)    GEN: Thin elderly female.  Sitting upright in the wheelchair. No acute distress  NECK: No JVD; No carotid bruits CARDIAC:  Irregular rate and rhythm, no murmurs, rubs, gallops RESPIRATORY:  Fine crackles in bilateral lung bases. Normal WOB on room air  ABDOMEN: Soft, non-tender, non-distended EXTREMITIES:  1+ edema in BLE; No deformity   ASSESSMENT AND PLAN  Chronic HFmrEF  Shortness of breath  Cough  - Patient was previously on lasix  20 mg PRN for weight gain. She was treated for PNA in 08/2023. After completing antibiotics, continued to have shortness of breath. Called the office 6/18 with elevated BNP. Her nursing facility checked pro bnp that was elevated to >5000. Started on lasix  20 mg daily and pro BNP improved to around 2000 - Echocardiogram 7/1 showed EF 45-50%, normal RV function - CXR 6/30 showed an ill-defined opacity at the right lung base, possible ateletasis or pneumonia. As patient had already completed a course of antibiotics for pneumonia, likely this was atelectasis or infiltrate rather than active pneumonia. Breathing and cough have both improved. Discussed with her PCP Dr. Charlanne who will repeat CXR in 3 months to ensure infiltrate has resolved. - Patient increased lasix  to 40 mg for 2 days after her appointment on 6/30. Has been on lasix  20 mg every other day since. She does not like taking lasix  due to frequent urination and some diarrhea. However, her breathing has improved and she is able to do everything that she would like to do at her facility  - She does have some crackles in lung bases and mild lower extremity swelling. However, she is able to do chair exercises and walk at her facility. She denies orthopnea, and only has rare episodes of shortness of breath. Overall, I think it is reasonable to continue lasix  20 mg every other day. Can take an additional 20 mg as needed for worsening swelling or weight gain  - Labs followed by facility  - Patient has been having some urinary hesitancy/retention.  Stop gemtesa  for about 1 week. If she has worsening urinary symptoms off gemtesa , can resume. If she feels better off gemtesa , can stop long term    Permanent Atrial Fibrillation  - Echo with severe biatrial dilation  - Rate controlled with metoprolol  succinate 150 mg daily. Denies palpitations  - Continue eliquis  2.5 mg BID - dose reduced for age, weight. No bleeding on eliquis      HTN  - BP well controlled. No symptoms of orthostatic hypotension  - Continue current regiment    HLD  - Lipid panel from 07/2023 showed LDL 44, HDL 53, triglycerides 86, total cholesterol 114  - With LDL 44, stop atorvastatin  20 mg daily to reduce pill burden     S/p MV and TV repair - Echo 09/24/23 showed mild mitral regurgitation, mild-moderate tricuspid regurgitation  - Continue lasix  20 mg every other day as above   Goals of Care  - Patient DNR. No life support. Wanting limited hospitalizations. Agreeable to antibiotics and IV fluids, no feeding tube  Dispo: Follow up with Dr. Francyne in 3-4 months   Signed, Rollo FABIENE Louder,  PA-C

## 2023-10-04 ENCOUNTER — Encounter: Payer: Self-pay | Admitting: Adult Health

## 2023-10-04 ENCOUNTER — Non-Acute Institutional Stay (SKILLED_NURSING_FACILITY): Payer: Self-pay | Admitting: Adult Health

## 2023-10-04 DIAGNOSIS — Z0000 Encounter for general adult medical examination without abnormal findings: Secondary | ICD-10-CM

## 2023-10-04 NOTE — Patient Instructions (Signed)
 Kathy Velez , Thank you for taking time to come for your Medicare Wellness Visit. I appreciate your ongoing commitment to your health goals. Please review the following plan we discussed and let me know if I can assist you in the future.   Screening recommendations/referrals: Colonoscopy aged out Mammogram aged out Bone Density hold off for now In skilled care now with progressing dementia.  Recommended yearly ophthalmology/optometry visit for glaucoma screening and checkup Recommended yearly dental visit for hygiene and checkup  Vaccinations: Influenza vaccine- due annually in September/October Pneumococcal vaccine up to date Tdap vaccine up to date Shingles vaccine up to date    Advanced directives: completed  Conditions/risks identified: fall  Next appointment: 1 year   Preventive Care 65 Years and Older, Female Preventive care refers to lifestyle choices and visits with your health care provider that can promote health and wellness. What does preventive care include? A yearly physical exam. This is also called an annual well check. Dental exams once or twice a year. Routine eye exams. Ask your health care provider how often you should have your eyes checked. Personal lifestyle choices, including: Daily care of your teeth and gums. Regular physical activity. Eating a healthy diet. Avoiding tobacco and drug use. Limiting alcohol use. Practicing safe sex. Taking low-dose aspirin  every day. Taking vitamin and mineral supplements as recommended by your health care provider. What happens during an annual well check? The services and screenings done by your health care provider during your annual well check will depend on your age, overall health, lifestyle risk factors, and family history of disease. Counseling  Your health care provider may ask you questions about your: Alcohol use. Tobacco use. Drug use. Emotional well-being. Home and relationship well-being. Sexual  activity. Eating habits. History of falls. Memory and ability to understand (cognition). Work and work Astronomer. Reproductive health. Screening  You may have the following tests or measurements: Height, weight, and BMI. Blood pressure. Lipid and cholesterol levels. These may be checked every 5 years, or more frequently if you are over 79 years old. Skin check. Lung cancer screening. You may have this screening every year starting at age 70 if you have a 30-pack-year history of smoking and currently smoke or have quit within the past 15 years. Fecal occult blood test (FOBT) of the stool. You may have this test every year starting at age 59. Flexible sigmoidoscopy or colonoscopy. You may have a sigmoidoscopy every 5 years or a colonoscopy every 10 years starting at age 49. Hepatitis C blood test. Hepatitis B blood test. Sexually transmitted disease (STD) testing. Diabetes screening. This is done by checking your blood sugar (glucose) after you have not eaten for a while (fasting). You may have this done every 1-3 years. Bone density scan. This is done to screen for osteoporosis. You may have this done starting at age 43. Mammogram. This may be done every 1-2 years. Talk to your health care provider about how often you should have regular mammograms. Talk with your health care provider about your test results, treatment options, and if necessary, the need for more tests. Vaccines  Your health care provider may recommend certain vaccines, such as: Influenza vaccine. This is recommended every year. Tetanus, diphtheria, and acellular pertussis (Tdap, Td) vaccine. You may need a Td booster every 10 years. Zoster vaccine. You may need this after age 33. Pneumococcal 13-valent conjugate (PCV13) vaccine. One dose is recommended after age 69. Pneumococcal polysaccharide (PPSV23) vaccine. One dose is recommended after age 33.  Talk to your health care provider about which screenings and vaccines  you need and how often you need them. This information is not intended to replace advice given to you by your health care provider. Make sure you discuss any questions you have with your health care provider. Document Released: 04/08/2015 Document Revised: 11/30/2015 Document Reviewed: 01/11/2015 Elsevier Interactive Patient Education  2017 [employer].  Fall Prevention in the Home Falls can cause injuries. They can happen to people of all ages. There are many things you can do to make your home safe and to help prevent falls. What can I do on the outside of my home? Regularly fix the edges of walkways and driveways and fix any cracks. Remove anything that might make you trip as you walk through a door, such as a raised step or threshold. Trim any bushes or trees on the path to your home. Use bright outdoor lighting. Clear any walking paths of anything that might make someone trip, such as rocks or tools. Regularly check to see if handrails are loose or broken. Make sure that both sides of any steps have handrails. Any raised decks and porches should have guardrails on the edges. Have any leaves, snow, or ice cleared regularly. Use sand or salt on walking paths during winter. Clean up any spills in your garage right away. This includes oil or grease spills. What can I do in the bathroom? Use night lights. Install grab bars by the toilet and in the tub and shower. Do not use towel bars as grab bars. Use non-skid mats or decals in the tub or shower. If you need to sit down in the shower, use a plastic, non-slip stool. Keep the floor dry. Clean up any water  that spills on the floor as soon as it happens. Remove soap buildup in the tub or shower regularly. Attach bath mats securely with double-sided non-slip rug tape. Do not have throw rugs and other things on the floor that can make you trip. What can I do in the bedroom? Use night lights. Make sure that you have a light by your bed that  is easy to reach. Do not use any sheets or blankets that are too big for your bed. They should not hang down onto the floor. Have a firm chair that has side arms. You can use this for support while you get dressed. Do not have throw rugs and other things on the floor that can make you trip. What can I do in the kitchen? Clean up any spills right away. Avoid walking on wet floors. Keep items that you use a lot in easy-to-reach places. If you need to reach something above you, use a strong step stool that has a grab bar. Keep electrical cords out of the way. Do not use floor polish or wax that makes floors slippery. If you must use wax, use non-skid floor wax. Do not have throw rugs and other things on the floor that can make you trip. What can I do with my stairs? Do not leave any items on the stairs. Make sure that there are handrails on both sides of the stairs and use them. Fix handrails that are broken or loose. Make sure that handrails are as long as the stairways. Check any carpeting to make sure that it is firmly attached to the stairs. Fix any carpet that is loose or worn. Avoid having throw rugs at the top or bottom of the stairs. If you do have throw  rugs, attach them to the floor with carpet tape. Make sure that you have a light switch at the top of the stairs and the bottom of the stairs. If you do not have them, ask someone to add them for you. What else can I do to help prevent falls? Wear shoes that: Do not have high heels. Have rubber bottoms. Are comfortable and fit you well. Are closed at the toe. Do not wear sandals. If you use a stepladder: Make sure that it is fully opened. Do not climb a closed stepladder. Make sure that both sides of the stepladder are locked into place. Ask someone to hold it for you, if possible. Clearly mark and make sure that you can see: Any grab bars or handrails. First and last steps. Where the edge of each step is. Use tools that help you  move around (mobility aids) if they are needed. These include: Canes. Walkers. Scooters. Crutches. Turn on the lights when you go into a dark area. Replace any light bulbs as soon as they burn out. Set up your furniture so you have a clear path. Avoid moving your furniture around. If any of your floors are uneven, fix them. If there are any pets around you, be aware of where they are. Review your medicines with your doctor. Some medicines can make you feel dizzy. This can increase your chance of falling. Ask your doctor what other things that you can do to help prevent falls. This information is not intended to replace advice given to you by your health care provider. Make sure you discuss any questions you have with your health care provider. Document Released: 01/06/2009 Document Revised: 08/18/2015 Document Reviewed: 04/16/2014 Elsevier Interactive Patient Education  2017 [employer].

## 2023-10-04 NOTE — Progress Notes (Signed)
 This encounter was created in error - please disregard.

## 2023-10-04 NOTE — Progress Notes (Signed)
 Subjective:   Kathy Velez is a 89 y.o. female who presents for Medicare Annual (Subsequent) preventive examination at [facility]   Visit Complete: In person  Patient Medicare AWV questionnaire was completed by the patient on 10/04/23; I have confirmed that all information answered by patient is correct and no changes since this date.  Cardiac Risk Factors include: advanced age (>43men, >94 women);hypertension     Objective:    There were no vitals filed for this visit. There is no height or weight on file to calculate BMI.     10/04/2023   10:53 AM 10/01/2023   10:56 AM 08/22/2023   11:34 AM 05/20/2023   12:11 PM 04/22/2023   11:01 AM 04/18/2023   12:52 PM 02/05/2023   12:10 PM  Advanced Directives  Does Patient Have a Medical Advance Directive? Yes Yes Yes Yes Yes Yes Yes  Type of Estate agent of [place];Living will;Out of facility DNR (pink MOST or yellow form) Healthcare Power of [place];Living will;Out of facility DNR (pink MOST or yellow form) Living will;Healthcare Power of [place];Out of facility DNR (pink MOST or yellow form) Healthcare Power of [employer] Power of [employer] Power of [employer] Power of [place];Living will  Does patient want to make changes to medical advance directive? Yes (Inpatient - patient requests chaplain consult to change a medical advance directive) No - Patient declined No - Patient declined No - Patient declined No - Patient declined No - Patient declined No - Patient declined  Copy of Healthcare Power of Attorney in Chart? Yes - validated most recent copy scanned in chart (See row information) Yes - validated most recent copy scanned in chart (See row information) Yes - validated most recent copy scanned in chart (See row information) No - copy requested No - copy requested No - copy requested Yes - validated most recent copy scanned in chart (See row information)     Current Medications (verified) Outpatient Encounter Medications as of 10/04/2023  Medication Sig   acetaminophen  (TYLENOL ) 500 MG tablet Take 1,000 mg by mouth in the morning and at bedtime.   amoxicillin (AMOXIL) 500 MG capsule Take 2,000 mg by mouth as needed.   apixaban  (ELIQUIS ) 2.5 MG TABS tablet Take 1 tablet (2.5 mg total) by mouth 2 (two) times daily.   atorvastatin  (LIPITOR) 20 MG tablet TAKE 1 TABLET DAILY   Cholecalciferol  (VITAMIN D ) 50 MCG (2000 UT) CAPS Take 2,000 Units by mouth in the morning.   cloNIDine  (CATAPRES ) 0.1 MG tablet Take 1 tablet (0.1 mg total) by mouth daily as needed. If SBP >180 only   ENBREL SURECLICK 50 MG/ML injection Inject 50 mg into the skin once a week.   furosemide  (LASIX ) 20 MG tablet Take 1 tablet (20 mg total) by mouth every other day.   furosemide  (LASIX ) 20 MG tablet Take 20 mg by mouth as needed.   Iron , Ferrous Sulfate , 325 (65 Fe) MG TABS Take 325 mg by mouth daily.   leflunomide  (ARAVA ) 20 MG tablet Take 20 mg by mouth in the morning.   levalbuterol  (XOPENEX ) 0.63 MG/3ML nebulizer solution Take 3 mLs (0.63 mg total) by nebulization every 6 (six) hours as needed for wheezing or shortness of breath.   levothyroxine  (SYNTHROID ) 88 MCG tablet Take 88 mcg by mouth daily before breakfast.   METOPROLOL  SUCCINATE PO Take 150 mg by mouth in the morning.   mirtazapine  (REMERON ) 15 MG tablet TAKE 1 TABLET AT BEDTIME  Multiple Vitamin (MULTIVITAMIN ADULT PO) Take 1 tablet by mouth every morning.   Multiple Vitamins-Minerals (PRESERVISION AREDS 2) CAPS Take 1 capsule by mouth in the morning and at bedtime.   omeprazole  (PRILOSEC) 40 MG capsule TAKE 1 CAPSULE DAILY   UNABLE TO FIND Apply topically daily. Med Name: JOBST hose   valsartan  (DIOVAN ) 160 MG tablet Take 160 mg by mouth daily.   Vibegron  (GEMTESA ) 75 MG TABS Take 75 mg by mouth at bedtime.   vitamin B-12 (CYANOCOBALAMIN ) 1000 MCG tablet Take 1,000 mcg by mouth in the morning.   No  facility-administered encounter medications on file as of 10/04/2023.    Allergies (verified) Claritin [loratadine], Codeine, Gabapentin , Molds & smuts, Pollen extract, Bee venom, and Wasp venom   History: Past Medical History:  Diagnosis Date   Allergy    seasonal   Anemia    Anxiety    Arthritis    Back   Asymptomatic menopausal state    Atrial fibrillation (HCC)    Cancer (HCC) 2006   Colon.  Basal Cell Skin cancer- right arm   Cataract    removed bilateral   Chronic atrial fibrillation (HCC)    Colon cancer (HCC)    Constipation due to pain medication therapy    after heart surgery   Deficiency of other specified B group vitamins    Diplopia    Disorientation, unspecified    Dysrhythmia    PAF   GERD (gastroesophageal reflux disease)    Heart murmur    Hyperlipidemia    Hypertension    Hypothyroidism    Malignant neoplasm of colon, unspecified (HCC)    Other specified diseases of blood and blood-forming organs    Other specified disorders of bone density and structure, unspecified forearm    Other thrombophilia (HCC)    per matrix   Overactive bladder    per Matrix   Personal history of other malignant neoplasm of large intestine    Presbycusis, bilateral    per matrix   RA (rheumatoid arthritis) (HCC)    Restless leg    Seizures (HCC)    after Heart Surgey   Stroke (HCC)    TIA- found by neurologist after    Valgus deformity, not elsewhere classified, right knee    per matrix   Vascular dementia, mild, without behavioral disturbance, psychotic disturbance, mood disturbance, and anxiety (HCC)    per matrix   Past Surgical History:  Procedure Laterality Date   ABDOMINAL HYSTERECTOMY  1970   Partial    COLON RESECTION  2006   cancer   COLON SURGERY     COLONOSCOPY     EYE SURGERY Bilateral    Cataract   MAXIMUM ACCESS (MAS)POSTERIOR LUMBAR INTERBODY FUSION (PLIF) 2 LEVEL N/A 04/12/2015   Procedure: Lumbar Three-Five Decompression, Pedicle Screw  Fixation, and Posteriolateral Arthrodesis;  Surgeon: Fairy Levels, MD;  Location: [facility];  Service: Neurosurgery;  Laterality: N/A;  L3-4 L4-5 Maximum access posterior lumbar fusion, possible interbodies and resection of synovial cyst at L4-5   MITRAL VALVE REPAIR  01/20/2013   Gore-tex cords to P1, P2, and P3. Magic suture to posterior medial commisure, #30 Physio 1 ring. Done in [place]    TONSILLECTOMY     about 1940   TRICUSPID VALVE SURGERY  01/20/2013   #28 TriAd ring done in [place]    Family History  Problem Relation Age of Onset   Lung disease Mother 10   Heart disease Father 75   Colon polyps Daughter  Alcohol abuse Son    Colon cancer Neg Hx    Stomach cancer Neg Hx    Rectal cancer Neg Hx    Crohn's disease Neg Hx    Esophageal cancer Neg Hx    Social History   Socioeconomic History   Marital status: Widowed    Spouse name: Not on file   Number of children: Not on file   Years of education: Not on file   Highest education level: Not on file  Occupational History   Occupation: elementary school teacher    Comment: retired   Tobacco Use   Smoking status: Never    Passive exposure: Past (mother smoked)   Smokeless tobacco: Never  Vaping Use   Vaping status: Never Used  Substance and Sexual Activity   Alcohol use: Yes    Alcohol/week: 1.0 standard drink of alcohol    Types: 1 Glasses of wine per week    Comment: 1-2 per week   Drug use: No   Sexual activity: Not Currently  Other Topics Concern   Not on file  Social History Narrative   Social History      Diet? Healthy- low salt, sugar, fat      Do you drink/eat things with caffeine? On occasion      Marital status?        widow                        What year were you married? 1958      Do you live in a house, apartment, assisted living, condo, trailer, etc.? apartment      Is it one or more stories? one      How many persons live in your home? one      Do you have any pets in your home?  (please list) no      Highest level of education completed? 4 year college      Current or past profession: Insurance account manager      Do you exercise?             yes                         Type & how often? Classes- senior retirement community/ some walking      Advanced Directives      Do you have a living will?      Do you have a DNR form?                                  If not, do you want to discuss one?      Do you have signed POA/HPOA for forms?       Functional Status Completed by: Daughter, Adin Palin      Do you have difficulty bathing or dressing yourself? no      Do you have difficulty preparing food or eating? no      Do you have difficulty managing your medications? no      Do you have difficulty managing your finances? no      Do you have difficulty affording your medications? no   Right handed   At [facility]   Beautiful daughter    Social Drivers of Health   Financial Resource Strain: Low Risk  (01/28/2018)   Overall Financial Resource Strain (CARDIA)  Difficulty of Paying Living Expenses: Not hard at all  Food Insecurity: No Food Insecurity (03/08/2022)   Hunger Vital Sign    Worried About Running Out of Food in the Last Year: Never true    Ran Out of Food in the Last Year: Never true  Transportation Needs: No Transportation Needs (03/08/2022)   PRAPARE - Administrator, Civil Service (Medical): No    Lack of Transportation (Non-Medical): No  Physical Activity: Sufficiently Active (01/28/2018)   Exercise Vital Sign    Days of Exercise per Week: 6 days    Minutes of Exercise per Session: 30 min  Stress: No Stress Concern Present (01/28/2018)   [employer] of Occupational Health - Occupational Stress Questionnaire    Feeling of Stress : Only a little  Social Connections: Moderately Isolated (01/28/2018)   Social Connection and Isolation Panel    Frequency of Communication with Friends and Family: More than three times a week     Frequency of Social Gatherings with Friends and Family: More than three times a week    Attends Religious Services: Never    Database administrator or Organizations: No    Attends Banker Meetings: Never    Marital Status: Widowed    Tobacco Counseling Counseling given: Not Answered   Clinical Intake:  Pre-visit preparation completed: No  Pain : No/denies pain     BMI - recorded: 21.62 Nutritional Status: BMI of 19-24  Normal Diabetes: No  How often do you need to have someone help you when you read instructions, pamphlets, or other written materials from your doctor or pharmacy?: 3 - Sometimes What is the last grade level you completed in school?: college  Interpreter Needed?: No  Information entered by :: Tawni America NP   Activities of Daily Living    10/04/2023   10:52 AM  In your present state of health, do you have any difficulty performing the following activities:  Hearing? 1  Vision? 1  Difficulty concentrating or making decisions? 1  Walking or climbing stairs? 1  Dressing or bathing? 1  Doing errands, shopping? 1  Preparing Food and eating ? Y  Using the Toilet? Y  In the past six months, have you accidently leaked urine? Y  Do you have problems with loss of bowel control? Y  Managing your Medications? Y  Managing your Finances? Y    Patient Care Team: Charlanne Fredia CROME, MD as PCP - General (Internal Medicine) Francyne Headland, MD as PCP - Cardiology (Cardiology) [name], DO as Consulting Physician (Neurology)  Indicate any recent Medical Services you may have received from other than [facility] providers in the past year (date may be approximate).     Assessment:   This is a routine wellness examination for [place].  Hearing/Vision screen No results found.   Goals Addressed             This Visit's Progress    Patient Stated       Maintain current status       Depression Screen    10/04/2023   10:46 AM 12/03/2022    10:00 AM 07/09/2022    1:44 PM 05/21/2022    1:06 PM 03/22/2022   11:45 AM 02/07/2021    9:58 AM 02/05/2020    3:47 PM  PHQ 2/9 Scores  PHQ - 2 Score 0 0 0 0 0 0 0    Fall Risk    10/04/2023   10:46 AM 04/18/2023  12:52 PM 08/29/2022    3:31 PM 07/09/2022    1:44 PM 06/06/2022    4:55 PM  Fall Risk   Falls in the past year? 0 0 1 0 1  Number falls in past yr: 0 0 0 0 1  Injury with Fall? 0 0 1 0 1  Risk for fall due to : History of fall(s)   History of fall(s);Impaired balance/gait;Impaired mobility History of fall(s);Impaired balance/gait;Impaired mobility  Follow up Falls evaluation completed Falls evaluation completed Falls evaluation completed Falls evaluation completed Falls evaluation completed;Education provided;Falls prevention discussed    MEDICARE RISK AT HOME: Medicare Risk at Home Any stairs in or around the home?: No If so, are there any without handrails?: No Home free of loose throw rugs in walkways, pet beds, electrical cords, etc?: Yes Adequate lighting in your home to reduce risk of falls?: Yes Life alert?: Yes Use of a cane, walker or w/c?: Yes Grab bars in the bathroom?: Yes Shower chair or bench in shower?: Yes Elevated toilet seat or a handicapped toilet?: Yes  TIMED UP AND GO:  Was the test performed?  No    Cognitive Function:    04/18/2023    1:00 PM 08/30/2022    8:00 AM 03/22/2022   12:27 PM 07/13/2019    1:58 PM 01/28/2018    1:52 PM  MMSE - Mini Mental State Exam  Orientation to time 3 4 4 5 5   Orientation to Place 5 5 5 5 5   Registration 3 3 3 3 3   Attention/ Calculation 5 3 3 5 5   Recall 1 1 1 3 3   Language- name 2 objects 2 1 2 2 2   Language- repeat 1 1 1 1 1   Language- follow 3 step command 3 3 3 3 3   Language- read & follow direction 1 1 1 1 1   Write a sentence 1 0 1 1 1   Copy design 1 1 1 1 1   Total score 26 23 25 30 30         10/04/2023   10:46 AM 02/07/2021   10:04 AM 02/05/2020    3:49 PM 02/02/2019    8:47 AM  6CIT Screen   What Year? 0 points 0 points 0 points 0 points  What month? 0 points 0 points 0 points 0 points  What time? 0 points 0 points 0 points 0 points  Count back from 20 0 points 0 points 0 points 0 points  Months in reverse 4 points 0 points 2 points 0 points  Repeat phrase 6 points 2 points 2 points 0 points  Total Score 10 points 2 points 4 points 0 points    Immunizations Immunization History  Administered Date(s) Administered   Fluad Quad(high Dose 65+) 01/05/2019, 12/07/2021, 01/15/2023   Fluad Trivalent(High Dose 65+) 01/15/2023   H1N1 04/07/2008   Influenza Whole 12/29/2008, 12/22/2009   Influenza, High Dose Seasonal PF 01/15/2012, 12/23/2014, 01/22/2020   Influenza,inj,Quad PF,6+ Mos 01/22/2014, 12/10/2016, 01/17/2018   Influenza,trivalent, recombinat, inj, PF 01/05/2016   Influenza-Unspecified 04/07/2008, 12/15/2014, 01/13/2021   Moderna Covid-19 Vaccine  Bivalent Booster 44yrs & up 01/29/2022, 01/15/2023   Moderna Sars-Covid-2 Vaccination 04/07/2019, 05/05/2019, 02/09/2020   PFIZER(Purple Top)SARS-COV-2 Vaccination 01/06/2021   PPD Test 07/17/2010, 04/19/2015, 04/25/2015   Pfizer Covid-19 Vaccine Bivalent Booster 61yrs & up 07/19/2022   Pneumococcal Conjugate-13 11/10/2013   Pneumococcal Polysaccharide-23 12/27/2010, 12/05/2016   RSV,unspecified 03/28/2022   Tdap 04/15/2017   Zoster Recombinant(Shingrix) 04/09/2017, 06/18/2017    TDAP status: Up to date  Flu Vaccine status: Up to date  Pneumococcal vaccine status: Up to date  Covid-19 vaccine status: Completed vaccines  Qualifies for Shingles Vaccine? Yes   Zostavax completed Yes   Shingrix Completed?: Yes  Screening Tests Health Maintenance  Topic Date Due   Medicare Annual Wellness (AWV)  03/23/2023   COVID-19 Vaccine (8 - 2024-25 season) 03/26/2024 (Originally 03/12/2023)   INFLUENZA VACCINE  10/25/2023   DTaP/Tdap/Td (2 - Td or Tdap) 04/16/2027   Pneumococcal Vaccine: 50+ Years  Completed   DEXA SCAN   Completed   Zoster Vaccines- Shingrix  Completed   Hepatitis B Vaccines  Aged Out   HPV VACCINES  Aged Out   Meningococcal B Vaccine  Aged Out    Health Maintenance  Health Maintenance Due  Topic Date Due   Medicare Annual Wellness (AWV)  03/23/2023    Colorectal cancer screening: No longer required.   Mammogram status: No longer required due to aged.  Bone Density status: Completed 2022. Results reflect: Bone density results: OSTEOPENIA. Repeat every NA years.  Lung Cancer Screening: (Low Dose CT Chest recommended if Age 71-80 years, 20 pack-year currently smoking OR have quit w/in 15years.) does not qualify.   Lung Cancer Screening Referral: NA  Additional Screening:  Hepatitis C Screening: does not qualify; Completed NA  Vision Screening: Recommended annual ophthalmology exams for early detection of glaucoma and other disorders of the eye. Is the patient up to date with their annual eye exam?  Yes  Who is the provider or what is the name of the office in which the patient attends annual eye exams? [facility] seen March 2025 If pt is not established with a provider, would they like to be referred to a provider to establish care? No .   Dental Screening: Recommended annual dental exams for proper oral hygiene  Diabetic Foot Exam: NA  Community Resource Referral / Chronic Care Management: CRR required this visit?  No   CCM required this visit?  No     Plan:     I have personally reviewed and noted the following in the patient's chart:   Medical and social history Use of alcohol, tobacco or illicit drugs  Current medications and supplements including opioid prescriptions. Patient is not currently taking opioid prescriptions. Functional ability and status Nutritional status Physical activity Advanced directives List of other physicians Hospitalizations, surgeries, and ER visits in previous 12 months Vitals Screenings to include cognitive, depression, and  falls Referrals and appointments  In addition, I have reviewed and discussed with patient certain preventive protocols, quality metrics, and best practice recommendations. A written personalized care plan for preventive services as well as general preventive health recommendations were provided to patient.     Tawni America, NP   10/04/2023   After Visit Summary: fax to [facility]  Nurse Notes: NA

## 2023-10-07 DIAGNOSIS — M0579 Rheumatoid arthritis with rheumatoid factor of multiple sites without organ or systems involvement: Secondary | ICD-10-CM | POA: Diagnosis not present

## 2023-10-07 DIAGNOSIS — M1991 Primary osteoarthritis, unspecified site: Secondary | ICD-10-CM | POA: Diagnosis not present

## 2023-10-07 DIAGNOSIS — Z6822 Body mass index (BMI) 22.0-22.9, adult: Secondary | ICD-10-CM | POA: Diagnosis not present

## 2023-10-07 DIAGNOSIS — M5136 Other intervertebral disc degeneration, lumbar region with discogenic back pain only: Secondary | ICD-10-CM | POA: Diagnosis not present

## 2023-10-07 DIAGNOSIS — R2689 Other abnormalities of gait and mobility: Secondary | ICD-10-CM | POA: Diagnosis not present

## 2023-10-07 DIAGNOSIS — R41841 Cognitive communication deficit: Secondary | ICD-10-CM | POA: Diagnosis not present

## 2023-10-07 DIAGNOSIS — Z79899 Other long term (current) drug therapy: Secondary | ICD-10-CM | POA: Diagnosis not present

## 2023-10-07 DIAGNOSIS — M8580 Other specified disorders of bone density and structure, unspecified site: Secondary | ICD-10-CM | POA: Diagnosis not present

## 2023-10-07 DIAGNOSIS — F01A0 Vascular dementia, mild, without behavioral disturbance, psychotic disturbance, mood disturbance, and anxiety: Secondary | ICD-10-CM | POA: Diagnosis not present

## 2023-10-08 DIAGNOSIS — F01A0 Vascular dementia, mild, without behavioral disturbance, psychotic disturbance, mood disturbance, and anxiety: Secondary | ICD-10-CM | POA: Diagnosis not present

## 2023-10-08 DIAGNOSIS — R41841 Cognitive communication deficit: Secondary | ICD-10-CM | POA: Diagnosis not present

## 2023-10-08 DIAGNOSIS — R2689 Other abnormalities of gait and mobility: Secondary | ICD-10-CM | POA: Diagnosis not present

## 2023-10-11 DIAGNOSIS — R41841 Cognitive communication deficit: Secondary | ICD-10-CM | POA: Diagnosis not present

## 2023-10-11 DIAGNOSIS — F01A0 Vascular dementia, mild, without behavioral disturbance, psychotic disturbance, mood disturbance, and anxiety: Secondary | ICD-10-CM | POA: Diagnosis not present

## 2023-10-11 DIAGNOSIS — R2689 Other abnormalities of gait and mobility: Secondary | ICD-10-CM | POA: Diagnosis not present

## 2023-10-14 DIAGNOSIS — R41841 Cognitive communication deficit: Secondary | ICD-10-CM | POA: Diagnosis not present

## 2023-10-14 DIAGNOSIS — F01A0 Vascular dementia, mild, without behavioral disturbance, psychotic disturbance, mood disturbance, and anxiety: Secondary | ICD-10-CM | POA: Diagnosis not present

## 2023-10-14 DIAGNOSIS — R2689 Other abnormalities of gait and mobility: Secondary | ICD-10-CM | POA: Diagnosis not present

## 2023-10-16 DIAGNOSIS — R41841 Cognitive communication deficit: Secondary | ICD-10-CM | POA: Diagnosis not present

## 2023-10-16 DIAGNOSIS — R2689 Other abnormalities of gait and mobility: Secondary | ICD-10-CM | POA: Diagnosis not present

## 2023-10-16 DIAGNOSIS — F01A0 Vascular dementia, mild, without behavioral disturbance, psychotic disturbance, mood disturbance, and anxiety: Secondary | ICD-10-CM | POA: Diagnosis not present

## 2023-10-17 ENCOUNTER — Ambulatory Visit: Attending: Cardiology | Admitting: Cardiology

## 2023-10-17 ENCOUNTER — Encounter: Payer: Self-pay | Admitting: Cardiology

## 2023-10-17 VITALS — BP 113/67 | HR 81 | Ht 62.0 in | Wt 125.6 lb

## 2023-10-17 DIAGNOSIS — R0602 Shortness of breath: Secondary | ICD-10-CM | POA: Diagnosis not present

## 2023-10-17 DIAGNOSIS — I10 Essential (primary) hypertension: Secondary | ICD-10-CM | POA: Insufficient documentation

## 2023-10-17 DIAGNOSIS — Z9889 Other specified postprocedural states: Secondary | ICD-10-CM | POA: Diagnosis not present

## 2023-10-17 DIAGNOSIS — I5033 Acute on chronic diastolic (congestive) heart failure: Secondary | ICD-10-CM | POA: Insufficient documentation

## 2023-10-17 DIAGNOSIS — I4821 Permanent atrial fibrillation: Secondary | ICD-10-CM | POA: Insufficient documentation

## 2023-10-17 DIAGNOSIS — E782 Mixed hyperlipidemia: Secondary | ICD-10-CM | POA: Diagnosis not present

## 2023-10-17 MED ORDER — FUROSEMIDE 20 MG PO TABS: 20.0000 mg | ORAL_TABLET | ORAL | 3 refills | Status: DC

## 2023-10-17 NOTE — Progress Notes (Deleted)
 Cardiology Office Note:  .   Date:  10/17/2023  ID:  Kathy Velez, DOB 05-16-1931, MRN [id number] PCP: Charlanne Fredia CROME, MD  [facility] Providers Cardiologist:  Jerel Balding, MD {  History of Present Illness: .   Kathy Velez is a 89 y.o. female with history of permanent atrial fibrillation, chronic HFpEF, history of CVA, vascular dementia, carotid artery disease, aortic atherosclerosis, HTN, history of mitral and tricuspid valve repair (MV #30 Physio Ring; TV # 28 TriAd Ring 2014, [place]), chronic interstitial lung disease, history of colon cancer in remission, GERD, treated hypothyroidism, hyponatremia (probable SIADH).      Permanent atrial fibrillation Severe dilatation of both atria, rate control strategy only.  Shortness of breath Echocardiogram 01/2023 preserved LVEF, mildly reduced RV. 09/11/23 had elevated BNP greater than 5000, also with recent diagnosis of pneumonia.  Prescribed Lasix  20 mg daily, previously had been on as needed.  40 mg for 2 days was also tried.  She did not want any on Lasix  given urinary incontinence.  Lasix  did not improve her symptoms. Echo 09/2023 EF 45 to 50%, normal RV.  Stable valve repair.  Mild to moderate TR.  Social history  lives at [facility], a skilled nursing facility       Patient with history of permanent atrial fibrillation.  In June 2025 she had been having issues with shortness of breath after diagnosis of pneumonia.  We tried treating as CHF and increased Lasix  without improvement.  Symptoms felt related to residual effects of pneumonia rather than HFpEF exacerbation.  Echocardiogram repeated, EF slightly lower at 45 to 50%.  Previously 55%  Shortness of breath Symptoms started after diagnosis of pneumonia.  Continued to have persistent symptoms despite up titration of Lasix .  Symptoms seem more suggestive of pulmonology driven pathology rather than volume.  Echocardiogram shows new mildly reduced EF 45 to 50, but she looks  euvolemic and had RA pressure of 3.    Chronic HFmrEF     Permanent Atrial Fibrillation  Severely dilated LA.  Rate control strategy only now. Continue with Toprol -XL 150 mg daily Continue eliquis  2.5 mg BID reduced for age, weight.  HTN    HLD  Lipid panel from 07/2023 showed LDL 44, HDL 53, triglycerides 86, total cholesterol 114  Continue lipitor 20 mg daily.   S/p MV and TV repair Echo 09/24/23 showed mild mitral regurgitation, mild-moderate tricuspid regurgitation, stable valve repair.   Goals of Care  She is DNR.   ROS: Denies: Chest pain, shortness of breath, orthopnea, peripheral edema, palpitations, decreased exercise intolerance, fatigue, lightheadedness.   Studies Reviewed: .         Risk Assessment/Calculations:   {Does this patient have ATRIAL FIBRILLATION?[phone]} No BP recorded.  {Refresh Note OR Click here to enter BP  :1}***       Physical Exam:   VS:  There were no vitals taken for this visit.   Wt Readings from Last 3 Encounters:  10/04/23 118 lb (53.5 kg)  10/04/23 118 lb 3.2 oz (53.6 kg)  10/01/23 122 lb 1.6 oz (55.4 kg)    GEN: Well nourished, well developed in no acute distress NECK: No JVD; No carotid bruits CARDIAC: ***RRR, no murmurs, rubs, gallops RESPIRATORY:  Clear to auscultation without rales, wheezing or rhonchi  ABDOMEN: Soft, non-tender, non-distended EXTREMITIES:  No edema; No deformity   ASSESSMENT AND PLAN: .         {Are you ordering a CV Procedure (e.g. stress test, cath, DCCV, TEE,  etc)?   Press F2        :[id number]}  Dispo: ***  Signed, Thom LITTIE Sluder, PA-C

## 2023-10-17 NOTE — Patient Instructions (Addendum)
 Medication Instructions:  Stop Gemtesa  Stop Lipitor Take lasix  20 mg once every other day, Take an additional dose of lasix  as needed for weight gain of 5 lbs in a week and/ or leg swelling  *If you need a refill on your cardiac medications before your next appointment, please call your pharmacy*  Lab Work: No labs  Testing/Procedures: No testing  Follow-Up: At [facility], you and your health needs are our priority.  As part of our continuing mission to provide you with exceptional heart care, our providers are all part of one team.  This team includes your primary Cardiologist (physician) and Advanced Practice Providers or APPs (Physician Assistants and Nurse Practitioners) who all work together to provide you with the care you need, when you need it.  Your next appointment:   3 month(s)  Provider:   Jerel Balding, MD    We recommend signing up for the patient portal called MyChart.  Sign up information is provided on this After Visit Summary.  MyChart is used to connect with patients for Virtual Visits (Telemedicine).  Patients are able to view lab/test results, encounter notes, upcoming appointments, etc.  Non-urgent messages can be sent to your provider as well.   To learn more about what you can do with MyChart, go to [email or web address].

## 2023-10-18 ENCOUNTER — Non-Acute Institutional Stay (SKILLED_NURSING_FACILITY): Payer: Self-pay | Admitting: Adult Health

## 2023-10-18 ENCOUNTER — Encounter: Payer: Self-pay | Admitting: Adult Health

## 2023-10-18 DIAGNOSIS — D509 Iron deficiency anemia, unspecified: Secondary | ICD-10-CM | POA: Diagnosis not present

## 2023-10-18 DIAGNOSIS — I4821 Permanent atrial fibrillation: Secondary | ICD-10-CM

## 2023-10-18 DIAGNOSIS — I5042 Chronic combined systolic (congestive) and diastolic (congestive) heart failure: Secondary | ICD-10-CM | POA: Diagnosis not present

## 2023-10-18 DIAGNOSIS — N3281 Overactive bladder: Secondary | ICD-10-CM | POA: Diagnosis not present

## 2023-10-18 DIAGNOSIS — M06049 Rheumatoid arthritis without rheumatoid factor, unspecified hand: Secondary | ICD-10-CM

## 2023-10-18 DIAGNOSIS — E039 Hypothyroidism, unspecified: Secondary | ICD-10-CM

## 2023-10-18 DIAGNOSIS — Z85038 Personal history of other malignant neoplasm of large intestine: Secondary | ICD-10-CM

## 2023-10-18 DIAGNOSIS — I10 Essential (primary) hypertension: Secondary | ICD-10-CM

## 2023-10-18 DIAGNOSIS — F0150 Vascular dementia without behavioral disturbance: Secondary | ICD-10-CM

## 2023-10-18 NOTE — Progress Notes (Signed)
 Location:  [facility] Room Number: /129/A Place of Service:  SNF (31) Provider:  Darlean Maus, NP    Patient Care Team: Charlanne Fredia CROME, MD as PCP - General (Internal Medicine) Croitoru, Jerel, MD as PCP - Cardiology (Cardiology) [name], DO as Consulting Physician (Neurology)  Extended Emergency Contact Information Primary Emergency Contact: Velez,Kathy Address: [address]          [place], [place] [address] [place] Home Phone: [phone] Mobile Phone: [phone] Relation: Daughter Secondary Emergency Contact: Kathy Velez Address: [address]          [place], [place] [address] [place] Phone: [phone] Relation: Son  Code Status: DNR Goals of care: Advanced Directive information    10/18/2023    9:18 AM  Advanced Directives  Does Patient Have a Medical Advance Directive? Yes  Type of Estate agent of [place];Living will;Out of facility DNR (pink MOST or yellow form)  Does patient want to make changes to medical advance directive? No - Patient declined  Copy of Healthcare Power of Attorney in Chart? Yes - validated most recent copy scanned in chart (See row information)  Pre-existing out of facility DNR order (yellow form or pink MOST form) Yellow form placed in chart (order not valid for inpatient use)     Chief Complaint  Patient presents with   Medical Management of Chronic Issues    Routine Visit    HPI:  Pt is a 89 y.o. female seen today for medical management of chronic diseases.    Update on care: seen by cardiology 10/17/23. Treated for PNA in June 2025, found to have elevated BNP, placed on lasix . Improved symptoms of sob. Able to lie flat. Still has some DOE but improved overall. Will continue lasix  20 mg every other day. Has a prn order. Also she is having some urinary hesitancy. Gemtesa  on hold as a trial to see if symptoms improve. Reports she  will be 89 years old soon and [place]   CHF: Echocardiogram 7/1 showed EF 45-50%, normal RV function  Wt Readings from Last 3 Encounters:  10/18/23 122 lb 1.6 oz (55.4 kg)  10/17/23 125 lb 9.6 oz (57 kg)  10/04/23 118 lb (53.5 kg)   S/P MV and TV repair AFib rate controlled on BB, on eliquis  for CVA risk reduction. Prior hx of CVA Has dementia now in skilled care. Ambulatory with a walker, pleasant, no behaviors.  Needs reminders and cuing. Goes to exercise class, socializes. MMSE 15/30 BP controlled 113/80 Hypothyroidism TSH 2.92 on supplementation  RA: on enbrel and arava  followed by rheumatology  Prior hx of colon cancer. She underwent immunotherapy for a malignant neoplasm of the splenic flexure. Followed by oncology  LDL 44 TC 114 08/02/23 on lipitor  Insomnia hx on remeron , no reports of difficulty sleeping  IDA: Hgb 11.8 MCV 103.6 also on B12  Hx of ILD  Past Medical History:  Diagnosis Date   Allergy    seasonal   Anemia    Anxiety    Arthritis    Back   Asymptomatic menopausal state    Atrial fibrillation (HCC)    Cancer (HCC) 2006   Colon.  Basal Cell Skin cancer- right arm   Cataract    removed bilateral   Chronic atrial fibrillation (HCC)    Colon cancer (HCC)    Constipation due to pain medication therapy    after heart surgery  Deficiency of other specified B group vitamins    Diplopia    Disorientation, unspecified    Dysrhythmia    PAF   GERD (gastroesophageal reflux disease)    Heart murmur    Hyperlipidemia    Hypertension    Hypothyroidism    Malignant neoplasm of colon, unspecified (HCC)    Other specified diseases of blood and blood-forming organs    Other specified disorders of bone density and structure, unspecified forearm    Other thrombophilia (HCC)    per matrix   Overactive bladder    per Matrix   Personal history of other malignant neoplasm of large intestine    Presbycusis, bilateral    per matrix    RA (rheumatoid arthritis) (HCC)    Restless leg    Seizures (HCC)    after Heart Surgey   Stroke (HCC)    TIA- found by neurologist after    Valgus deformity, not elsewhere classified, right knee    per matrix   Vascular dementia, mild, without behavioral disturbance, psychotic disturbance, mood disturbance, and anxiety (HCC)    per matrix   Past Surgical History:  Procedure Laterality Date   ABDOMINAL HYSTERECTOMY  1970   Partial    COLON RESECTION  2006   cancer   COLON SURGERY     COLONOSCOPY     EYE SURGERY Bilateral    Cataract   MAXIMUM ACCESS (MAS)POSTERIOR LUMBAR INTERBODY FUSION (PLIF) 2 LEVEL N/A 04/12/2015   Procedure: Lumbar Three-Five Decompression, Pedicle Screw Fixation, and Posteriolateral Arthrodesis;  Surgeon: Fairy Levels, MD;  Location: [facility];  Service: Neurosurgery;  Laterality: N/A;  L3-4 L4-5 Maximum access posterior lumbar fusion, possible interbodies and resection of synovial cyst at L4-5   MITRAL VALVE REPAIR  01/20/2013   Gore-tex cords to P1, P2, and P3. Magic suture to posterior medial commisure, #30 Physio 1 ring. Done in [place]    TONSILLECTOMY     about 1940   TRICUSPID VALVE SURGERY  01/20/2013   #28 TriAd ring done in [place]     Allergies  Allergen Reactions   Claritin [Loratadine] Other (See Comments)    Listed on MAR Unknown reaction   Codeine Other (See Comments)    just don't take it well   Gabapentin  Other (See Comments)    Dizziness    Molds & Smuts Other (See Comments)    Dust.  Reaction is not listed on MAR    Pollen Extract Swelling    Grass   Bee Venom Swelling and Rash   Wasp Venom Swelling and Rash    Outpatient Encounter Medications as of 10/18/2023  Medication Sig   acetaminophen  (TYLENOL ) 500 MG tablet Take 1,000 mg by mouth in the morning and at bedtime.   amoxicillin (AMOXIL) 500 MG capsule Take 2,000 mg by mouth as needed.   apixaban  (ELIQUIS ) 2.5 MG TABS tablet Take 1 tablet (2.5 mg total) by mouth 2  (two) times daily.   atorvastatin  (LIPITOR) 20 MG tablet Take 20 mg by mouth at bedtime.   Cholecalciferol  (VITAMIN D ) 50 MCG (2000 UT) CAPS Take 2,000 Units by mouth in the morning.   cloNIDine  (CATAPRES ) 0.1 MG tablet Take 1 tablet (0.1 mg total) by mouth daily as needed. If SBP >180 only   ENBREL SURECLICK 50 MG/ML injection Inject 50 mg into the skin once a week.   furosemide  (LASIX ) 20 MG tablet Take 1 tablet (20 mg total) by mouth every other day. Can take an additional does of lasix   for a weight gain 2-3 lbs overnight or increased swelling of the legs.   Iron , Ferrous Sulfate , 325 (65 Fe) MG TABS Take 325 mg by mouth daily.   leflunomide  (ARAVA ) 20 MG tablet Take 20 mg by mouth in the morning.   levalbuterol  (XOPENEX ) 0.63 MG/3ML nebulizer solution Take 3 mLs (0.63 mg total) by nebulization every 6 (six) hours as needed for wheezing or shortness of breath.   levothyroxine  (SYNTHROID ) 88 MCG tablet Take 88 mcg by mouth daily before breakfast.   METOPROLOL  SUCCINATE PO Take 150 mg by mouth in the morning.   mirtazapine  (REMERON ) 15 MG tablet TAKE 1 TABLET AT BEDTIME   Multiple Vitamin (MULTIVITAMIN ADULT PO) Take 1 tablet by mouth every morning.   Multiple Vitamins-Minerals (PRESERVISION AREDS 2) CAPS Take 1 capsule by mouth in the morning and at bedtime.   omeprazole  (PRILOSEC) 40 MG capsule TAKE 1 CAPSULE DAILY   UNABLE TO FIND Apply topically daily. Med Name: JOBST hose   valsartan  (DIOVAN ) 160 MG tablet Take 160 mg by mouth daily.   Vibegron  (GEMTESA ) 75 MG TABS Take 75 mg by mouth at bedtime.   vitamin B-12 (CYANOCOBALAMIN ) 1000 MCG tablet Take 1,000 mcg by mouth in the morning.   No facility-administered encounter medications on file as of 10/18/2023.    Review of Systems  Constitutional:  Negative for activity change, appetite change, chills, diaphoresis, fatigue and fever.  HENT:  Negative for congestion.   Respiratory:  Positive for shortness of breath. Negative for cough and  wheezing.   Cardiovascular:  Positive for leg swelling. Negative for chest pain.  Gastrointestinal:  Negative for abdominal distention, abdominal pain, constipation, diarrhea, nausea and vomiting.  Genitourinary:  Negative for difficulty urinating, dysuria and urgency.       Urinary hesitatncy  Musculoskeletal:  Positive for arthralgias and gait problem. Negative for back pain, myalgias and neck pain.  Skin:  Negative for rash.  Neurological:  Negative for dizziness, speech difficulty and weakness.  Psychiatric/Behavioral:  Negative for confusion.        Memory loss    Immunization History  Administered Date(s) Administered   Fluad Quad(high Dose 65+) 01/05/2019, 12/07/2021, 01/15/2023   Fluad Trivalent(High Dose 65+) 01/15/2023   H1N1 04/07/2008   Influenza Whole 12/29/2008, 12/22/2009   Influenza, High Dose Seasonal PF 01/15/2012, 12/23/2014, 01/22/2020   Influenza,inj,Quad PF,6+ Mos 01/22/2014, 12/10/2016, 01/17/2018   Influenza,trivalent, recombinat, inj, PF 01/05/2016   Influenza-Unspecified 04/07/2008, 12/15/2014, 01/13/2021   Moderna Covid-19 Vaccine  Bivalent Booster 56yrs & up 01/29/2022, 01/15/2023   Moderna Sars-Covid-2 Vaccination 04/07/2019, 05/05/2019, 02/09/2020   PFIZER(Purple Top)SARS-COV-2 Vaccination 01/06/2021   PPD Test 07/17/2010, 04/19/2015, 04/25/2015   Pfizer Covid-19 Vaccine Bivalent Booster 94yrs & up 07/19/2022   Pneumococcal Conjugate-13 11/10/2013   Pneumococcal Polysaccharide-23 12/27/2010, 12/05/2016   RSV,unspecified 03/28/2022   Tdap 04/15/2017   Zoster Recombinant(Shingrix) 04/09/2017, 06/18/2017   Pertinent  Health Maintenance Due  Topic Date Due   INFLUENZA VACCINE  10/25/2023   DEXA SCAN  Completed      06/06/2022    4:55 PM 07/09/2022    1:44 PM 08/29/2022    3:31 PM 04/18/2023   12:52 PM 10/04/2023   10:46 AM  Fall Risk  Falls in the past year? 1 0 1 0 0  Was there an injury with Fall? 1 0 1 0 0  Fall Risk Category Calculator 3 0 2 0 0   Patient at Risk for Falls Due to History of fall(s);Impaired balance/gait;Impaired mobility History of fall(s);Impaired balance/gait;Impaired  mobility   History of fall(s)  Fall risk Follow up Falls evaluation completed;Education provided;Falls prevention discussed Falls evaluation completed Falls evaluation completed Falls evaluation completed Falls evaluation completed   Functional Status Survey:    Vitals:   10/18/23 0921  BP: 113/80  Pulse: 95  Resp: 20  Temp: 98.2 F (36.8 C)  SpO2: 95%  Weight: 122 lb 1.6 oz (55.4 kg)  Height: 5' 2 (1.575 m)   Body mass index is 22.33 kg/m. Physical Exam Vitals reviewed.  Constitutional:      General: She is not in acute distress.    Appearance: She is not diaphoretic.  HENT:     Head: Normocephalic and atraumatic.  Neck:     Vascular: No JVD.  Cardiovascular:     Rate and Rhythm: Normal rate. Rhythm irregular.     Heart sounds: No murmur heard. Pulmonary:     Effort: Pulmonary effort is normal. No respiratory distress.     Breath sounds: Rales (faint bases) present. No wheezing.  Abdominal:     General: Bowel sounds are normal. There is no distension.     Palpations: Abdomen is soft.     Tenderness: There is no abdominal tenderness.  Musculoskeletal:     Right lower leg: Edema present.     Left lower leg: Edema present.  Skin:    General: Skin is warm and dry.  Neurological:     General: No focal deficit present.     Mental Status: She is alert. Mental status is at baseline.  Psychiatric:        Mood and Affect: Mood normal.     Labs reviewed: Recent Labs    02/05/23 1146 08/02/23 0000 09/19/23 0000 10/02/23 0000  NA 138 133* 135* 134*  K 4.1 4.2 4.3 4.3  CL 104 99 98* 97*  CO2 24 23* 16 20  GLUCOSE 96  --   --   --   BUN 34* 21 34* 32*  CREATININE 1.15* 0.9 1.0 1.2*  CALCIUM  9.8 9.2 8.8 9.1   Recent Labs    12/31/22 0000 02/05/23 1146 08/02/23 0000  AST 26 18 25   ALT 36* 19 22  ALKPHOS 104 91 118   BILITOT  --  0.8  --   PROT  --  7.1  --   ALBUMIN 4.1 4.1  --    Recent Labs    12/31/22 0000 02/05/23 1146 04/04/23 0000 08/02/23 0000 08/13/23 1130  WBC 6.4 7.6 5.8 5.8 7.6  NEUTROABS 3.70 4.5  --   --  4.3  HGB 10.6* 11.5* 11.0* 11.2* 11.8*  HCT 31* 34.6* 33* 34* 35.0*  MCV  --  102.4*  --   --  103.6*  PLT 157 163 144* 167 177   Lab Results  Component Value Date   TSH 2.92 09/13/2023   Lab Results  Component Value Date   HGBA1C 5.5 01/12/2013   Lab Results  Component Value Date   CHOL 114 08/02/2023   HDL 53 08/02/2023   LDLCALC 44 08/02/2023   TRIG 86 08/02/2023    Significant Diagnostic Results in last 30 days:  DG Chest 2 View Result Date: 10/02/2023 CLINICAL DATA:  Chronic heart failure. EXAM: CHEST - 2 VIEW COMPARISON:  Radiograph 08/06/2022, chest CT 03/31/2021 FINDINGS: Prior median sternotomy. Prosthetic cardiac valves. Borderline mild cardiomegaly, unchanged from prior. There is mild diffuse interstitial thickening, without significant interval change. Ill-defined opacity at the right lung base, not definitively seen on prior. No pleural  effusion. No pneumothorax. No acute osseous abnormalities are seen. IMPRESSION: 1. Ill-defined opacity at the right lung base, not definitively seen on prior. This may represent atelectasis or pneumonia. 2. Mild diffuse interstitial thickening, without significant interval change. This may represent chronic lung disease versus pulmonary edema. 3. Borderline mild cardiomegaly, unchanged. Electronically Signed   By: Andrea Gasman M.D.   On: 10/02/2023 16:53   ECHOCARDIOGRAM COMPLETE Result Date: 09/24/2023    ECHOCARDIOGRAM REPORT   [name] Date of Exam: 09/24/2023 Medical Rec #:  [id number]   Height:       62.0 in Accession #:    [id number]  Weight:       122.0 lb Date of Birth:  1931-08-18   BSA:          1.549 m Patient Age:    89 years    BP:           102/64 mmHg Patient Gender: F           HR:           98  bpm. Exam Location:  Outpatient Procedure: 2D Echo, 3D Echo, Cardiac Doppler, Color Doppler and Strain Analysis            (Both Spectral and Color Flow Doppler were utilized during            procedure). Indications:    CHF  History:        Patient has prior history of Echocardiogram examinations, most                 recent 02/12/2023. TIA and Stroke, Arrythmias:Atrial                 Fibrillation and Atrial Flutter, Signs/Symptoms:Shortness of                 Breath; Risk Factors:Hypertension, Non-Smoker and Dyslipidemia.                 MV #30 Physio Ring; TV # 28 TriAd Ring 2014, [place];Chronic                 HFpEF.                  Mitral Valve: 30 mm prosthetic annuloplasty ring valve is                 present in the mitral position. Procedure Date: 01/20/2013.  Sonographer:    Orvil Holmes RDCS Referring Phys: [id number] ROLLO JONELLE LOUDER IMPRESSIONS  1. Left ventricular ejection fraction, by estimation, is 45 to 50%. Left ventricular ejection fraction by 3D volume is 46 %. The left ventricle has mildly decreased function. The left ventricle demonstrates global hypokinesis. There is mild left ventricular hypertrophy. Left ventricular diastolic parameters are indeterminate.  2. Right ventricular systolic function is normal. The right ventricular size is moderately enlarged.  3. Left atrial size was severely dilated.  4. Right atrial size was mildly dilated.  5. The mitral valve has been repaired/replaced. Mild mitral valve regurgitation. No evidence of mitral stenosis. There is a 30 mm prosthetic annuloplasty ring present in the mitral position. Procedure Date: 01/20/2013.  6. The tricuspid valve is has been repaired/replaced. The tricuspid valve is status post repair with an annuloplasty ring. Tricuspid valve regurgitation is mild to moderate.  7. The aortic valve is tricuspid. Aortic valve regurgitation is not visualized. No aortic stenosis is present.  8. The inferior vena cava is normal  in size with  greater than 50% respiratory variability, suggesting right atrial pressure of 3 mmHg. FINDINGS  Left Ventricle: Left ventricular ejection fraction, by estimation, is 45 to 50%. Left ventricular ejection fraction by 3D volume is 46 %. The left ventricle has mildly decreased function. The left ventricle demonstrates global hypokinesis. Global longitudinal strain performed but not reported based on interpreter judgement due to suboptimal tracking. The left ventricular internal cavity size was normal in size. There is mild left ventricular hypertrophy. Left ventricular diastolic parameters are indeterminate. Right Ventricle: The right ventricular size is moderately enlarged. No increase in right ventricular wall thickness. Right ventricular systolic function is normal. Left Atrium: Left atrial size was severely dilated. Right Atrium: Right atrial size was mildly dilated. Pericardium: There is no evidence of pericardial effusion. Mitral Valve: The mitral valve has been repaired/replaced. Mild mitral valve regurgitation. There is a 30 mm prosthetic annuloplasty ring present in the mitral position. Procedure Date: 01/20/2013. No evidence of mitral valve stenosis. Tricuspid Valve: The tricuspid valve is has been repaired/replaced. Tricuspid valve regurgitation is mild to moderate. No evidence of tricuspid stenosis. The tricuspid valve is status post repair with an annuloplasty ring. Aortic Valve: The aortic valve is tricuspid. Aortic valve regurgitation is not visualized. No aortic stenosis is present. Pulmonic Valve: The pulmonic valve was normal in structure. Pulmonic valve regurgitation is not visualized. No evidence of pulmonic stenosis. Aorta: The aortic root is normal in size and structure. Venous: The inferior vena cava is normal in size with greater than 50% respiratory variability, suggesting right atrial pressure of 3 mmHg. IAS/Shunts: No atrial level shunt detected by color flow Doppler. Additional Comments: 3D  was performed not requiring image post processing on an independent workstation and was abnormal.  LEFT VENTRICLE PLAX 2D LVIDd:         3.67 cm         Diastology LVIDs:         2.39 cm         LV e' medial:    4.14 cm/s LV PW:         1.30 cm         LV E/e' medial:  30.7 LV IVS:        0.98 cm         LV e' lateral:   8.19 cm/s LVOT diam:     2.00 cm         LV E/e' lateral: 15.5 LV SV:         28 LV SV Index:   18 LVOT Area:     3.14 cm        3D Volume EF                                LV 3D EF:    Left                                             ventricul                                             ar  ejection                                             fraction                                             by 3D                                             volume is                                             46 %.                                 3D Volume EF:                                3D EF:        46 %                                LV EDV:       91 ml                                LV ESV:       49 ml                                LV SV:        42 ml RIGHT VENTRICLE RV Basal diam:  5.10 cm RV Mid diam:    3.46 cm RV S prime:     6.24 cm/s TAPSE (M-mode): 0.8 cm LEFT ATRIUM             Index        RIGHT ATRIUM           Index LA diam:        5.20 cm 3.36 cm/m   RA Area:     17.40 cm LA Vol (A2C):   87.2 ml 56.28 ml/m  RA Volume:   48.80 ml  31.49 ml/m LA Vol (A4C):   82.6 ml 53.31 ml/m LA Biplane Vol: 84.8 ml 54.73 ml/m  AORTIC VALVE LVOT Vmax:   60.30 cm/s LVOT Vmean:  39.500 cm/s LVOT VTI:    0.090 m  AORTA Ao Root diam: 3.20 cm Ao Asc diam:  3.60 cm MITRAL VALVE                  TRICUSPID VALVE MV Area (PHT): 4.68 cm       TR Peak grad:   50.7 mmHg MV Decel Time: 162 msec       TR Vmax:  356.00 cm/s MR Peak grad:    77.8 mmHg MR Mean grad:    53.0 mmHg    SHUNTS MR Vmax:         441.00 cm/s  Systemic VTI:  0.09 m MR Vmean:        347.0 cm/s    Systemic Diam: 2.00 cm MR PISA:         3.08 cm MR PISA Eff ROA: 22 mm MR PISA Radius:  0.70 cm MV E velocity: 127.00 cm/s MV A velocity: 48.40 cm/s MV E/A ratio:  2.62 Oneil Parchment MD Electronically signed by Oneil Parchment MD Signature Date/Time: 09/24/2023/12:05:21 PM    Final     Assessment/Plan Rheumatoid arthritis (HCC) On Arava  and Enbrel Also using tylenol  for joint pain F/u with rheumatology   Permanent atrial fibrillation (HCC) Rate is controlled Irregular irregular On eliquis  for CVA risk reduction Followed by cardiology  Overactive bladder Gemtesa  on hold to see if urinary hesitancy improved starting today   Iron  deficiency anemia On iron  daily Hgb stable  Hypothyroidism TSH at goal On synthroid    History of colon cancer Followed by oncology   Essential hypertension Controlled  Chronic heart failure (HCC) EF reduced 45-50% Now on lasix  every other day Remains sob on exertion but is improving Followed by cardiology   Vascular dementia without behavioral disturbance (HCC) Small vessel ischemic changes on CT scan 2023 Now in skilled care Attempts to remain active and socialize Very pleasant with no behaviors

## 2023-10-19 ENCOUNTER — Encounter: Payer: Self-pay | Admitting: Adult Health

## 2023-10-19 DIAGNOSIS — F5101 Primary insomnia: Secondary | ICD-10-CM

## 2023-10-19 DIAGNOSIS — I509 Heart failure, unspecified: Secondary | ICD-10-CM | POA: Insufficient documentation

## 2023-10-19 NOTE — Assessment & Plan Note (Signed)
 On Arava  and Enbrel Also using tylenol  for joint pain F/u with rheumatology

## 2023-10-19 NOTE — Assessment & Plan Note (Signed)
 Followed by oncology

## 2023-10-19 NOTE — Assessment & Plan Note (Signed)
 TSH at goal On synthroid 

## 2023-10-19 NOTE — Assessment & Plan Note (Signed)
 On iron  daily Hgb stable

## 2023-10-19 NOTE — Assessment & Plan Note (Signed)
 EF reduced 45-50% Now on lasix  every other day Remains sob on exertion but is improving Followed by cardiology

## 2023-10-19 NOTE — Assessment & Plan Note (Signed)
 Gemtesa  on hold to see if urinary hesitancy improved starting today

## 2023-10-19 NOTE — Assessment & Plan Note (Signed)
 Small vessel ischemic changes on CT scan 2023 Now in skilled care Attempts to remain active and socialize Very pleasant with no behaviors

## 2023-10-19 NOTE — Assessment & Plan Note (Signed)
 Rate is controlled Irregular irregular On eliquis  for CVA risk reduction Followed by cardiology

## 2023-10-19 NOTE — Assessment & Plan Note (Signed)
 Controlled.

## 2023-10-21 DIAGNOSIS — R41841 Cognitive communication deficit: Secondary | ICD-10-CM | POA: Diagnosis not present

## 2023-10-21 DIAGNOSIS — F01A0 Vascular dementia, mild, without behavioral disturbance, psychotic disturbance, mood disturbance, and anxiety: Secondary | ICD-10-CM | POA: Diagnosis not present

## 2023-10-21 DIAGNOSIS — R2689 Other abnormalities of gait and mobility: Secondary | ICD-10-CM | POA: Diagnosis not present

## 2023-10-21 MED ORDER — MIRTAZAPINE 15 MG PO TABS: 7.5000 mg | ORAL_TABLET | Freq: Every day | ORAL | 3 refills | Status: DC

## 2023-10-22 ENCOUNTER — Encounter: Payer: Self-pay | Admitting: Orthopedic Surgery

## 2023-10-22 ENCOUNTER — Non-Acute Institutional Stay (SKILLED_NURSING_FACILITY): Payer: Self-pay | Admitting: Orthopedic Surgery

## 2023-10-22 DIAGNOSIS — I5033 Acute on chronic diastolic (congestive) heart failure: Secondary | ICD-10-CM | POA: Diagnosis not present

## 2023-10-22 NOTE — Progress Notes (Unsigned)
 Location:  [facility] Room Number: 129-P Place of Service:  SNF ([phone]) Provider:  Gil Greig BRAVO, NP   Patient Care Team: Charlanne Fredia CROME, MD as PCP - General (Internal Medicine) Croitoru, Jerel, MD as PCP - Cardiology (Cardiology) [name], DO as Consulting Physician (Neurology)  Extended Emergency Contact Information Primary Emergency Contact: [name],[name] Address: [address]          [place], [place] [address] [place] Home Phone: [phone] Mobile Phone: [phone] Relation: Daughter Secondary Emergency Contact: [name] Address: [address]          [place], [place] [address] [place] Phone: [phone] Relation: Son  Code Status:  DNR Goals of care: Advanced Directive information    10/18/2023    9:18 AM  Advanced Directives  Does Patient Have a Medical Advance Directive? Yes  Type of Estate agent of [place];Living will;Out of facility DNR (pink MOST or yellow form)  Does patient want to make changes to medical advance directive? No - Patient declined  Copy of Healthcare Power of Attorney in Chart? Yes - validated most recent copy scanned in chart (See row information)  Pre-existing out of facility DNR order (yellow form or pink MOST form) Yellow form placed in chart (order not valid for inpatient use)     Chief Complaint  Patient presents with   Shortness of Breath    HPI:  Pt is a 89 y.o. female seen today for an acute visit for    Past Medical History:  Diagnosis Date   Allergy    seasonal   Anemia    Anxiety    Arthritis    Back   Asymptomatic menopausal state    Atrial fibrillation (HCC)    Cancer (HCC) 2006   Colon.  Basal Cell Skin cancer- right arm   Cataract    removed bilateral   Chronic atrial fibrillation (HCC)    Colon cancer (HCC)    Constipation due to pain medication therapy    after heart surgery   Deficiency of other  specified B group vitamins    Diplopia    Disorientation, unspecified    Dysrhythmia    PAF   GERD (gastroesophageal reflux disease)    Heart murmur    Hyperlipidemia    Hypertension    Hypothyroidism    Malignant neoplasm of colon, unspecified (HCC)    Other specified diseases of blood and blood-forming organs    Other specified disorders of bone density and structure, unspecified forearm    Other thrombophilia (HCC)    per matrix   Overactive bladder    per Matrix   Personal history of other malignant neoplasm of large intestine    Presbycusis, bilateral    per matrix   RA (rheumatoid arthritis) (HCC)    Restless leg    Seizures (HCC)    after Heart Surgey   Stroke (HCC)    TIA- found by neurologist after    Valgus deformity, not elsewhere classified, right knee    per matrix   Vascular dementia, mild, without behavioral disturbance, psychotic disturbance, mood disturbance, and anxiety (HCC)    per matrix   Past Surgical History:  Procedure Laterality Date   ABDOMINAL HYSTERECTOMY  1970   Partial    COLON RESECTION  2006   cancer   COLON SURGERY     COLONOSCOPY     EYE SURGERY Bilateral    Cataract   MAXIMUM ACCESS (  MAS)POSTERIOR LUMBAR INTERBODY FUSION (PLIF) 2 LEVEL N/A 04/12/2015   Procedure: Lumbar Three-Five Decompression, Pedicle Screw Fixation, and Posteriolateral Arthrodesis;  Surgeon: Fairy Levels, MD;  Location: [facility];  Service: Neurosurgery;  Laterality: N/A;  L3-4 L4-5 Maximum access posterior lumbar fusion, possible interbodies and resection of synovial cyst at L4-5   MITRAL VALVE REPAIR  01/20/2013   Gore-tex cords to P1, P2, and P3. Magic suture to posterior medial commisure, #30 Physio 1 ring. Done in [place]    TONSILLECTOMY     about 1940   TRICUSPID VALVE SURGERY  01/20/2013   #28 TriAd ring done in [place]     Allergies  Allergen Reactions   Claritin [Loratadine] Other (See Comments)    Listed on MAR Unknown reaction   Codeine Other  (See Comments)    just don't take it well   Gabapentin  Other (See Comments)    Dizziness    Molds & Smuts Other (See Comments)    Dust.  Reaction is not listed on MAR    Pollen Extract Swelling    Grass   Bee Venom Swelling and Rash   Wasp Venom Swelling and Rash    Outpatient Encounter Medications as of 10/22/2023  Medication Sig   acetaminophen  (TYLENOL ) 500 MG tablet Take 1,000 mg by mouth in the morning and at bedtime.   amoxicillin (AMOXIL) 500 MG capsule Take 2,000 mg by mouth as needed.   apixaban  (ELIQUIS ) 2.5 MG TABS tablet Take 1 tablet (2.5 mg total) by mouth 2 (two) times daily.   Cholecalciferol  (VITAMIN D ) 50 MCG (2000 UT) CAPS Take 2,000 Units by mouth in the morning.   cloNIDine  (CATAPRES ) 0.1 MG tablet Take 1 tablet (0.1 mg total) by mouth daily as needed. If SBP >180 only   ENBREL SURECLICK 50 MG/ML injection Inject 50 mg into the skin once a week.   furosemide  (LASIX ) 20 MG tablet Take 1 tablet (20 mg total) by mouth every other day. Can take an additional does of lasix  for a weight gain 2-3 lbs overnight or increased swelling of the legs.   Iron , Ferrous Sulfate , 325 (65 Fe) MG TABS Take 325 mg by mouth daily.   leflunomide  (ARAVA ) 20 MG tablet Take 20 mg by mouth in the morning.   levalbuterol  (XOPENEX ) 0.63 MG/3ML nebulizer solution Take 3 mLs (0.63 mg total) by nebulization every 6 (six) hours as needed for wheezing or shortness of breath.   levothyroxine  (SYNTHROID ) 88 MCG tablet Take 88 mcg by mouth daily before breakfast.   METOPROLOL  SUCCINATE PO Take 150 mg by mouth in the morning.   mirtazapine  (REMERON ) 15 MG tablet Take 0.5 tablets (7.5 mg total) by mouth at bedtime. Take 7.5 mg each night for two weeks then discontinue   Multiple Vitamin (MULTIVITAMIN ADULT PO) Take 1 tablet by mouth every morning.   Multiple Vitamins-Minerals (PRESERVISION AREDS 2) CAPS Take 1 capsule by mouth in the morning and at bedtime.   omeprazole  (PRILOSEC) 40 MG capsule TAKE 1  CAPSULE DAILY   UNABLE TO FIND Apply topically daily. Med Name: JOBST hose   valsartan  (DIOVAN ) 160 MG tablet Take 160 mg by mouth daily.   vitamin B-12 (CYANOCOBALAMIN ) 1000 MCG tablet Take 1,000 mcg by mouth in the morning.   No facility-administered encounter medications on file as of 10/22/2023.    Review of Systems  Immunization History  Administered Date(s) Administered   Fluad Quad(high Dose 65+) 01/05/2019, 12/07/2021, 01/15/2023   Fluad Trivalent(High Dose 65+) 01/15/2023   H1N1 04/07/2008  Influenza Whole 12/29/2008, 12/22/2009   Influenza, High Dose Seasonal PF 01/15/2012, 12/23/2014, 01/22/2020   Influenza,inj,Quad PF,6+ Mos 01/22/2014, 12/10/2016, 01/17/2018   Influenza,trivalent, recombinat, inj, PF 01/05/2016   Influenza-Unspecified 04/07/2008, 12/15/2014, 01/13/2021   Moderna Covid-19 Vaccine  Bivalent Booster 19yrs & up 01/29/2022, 01/15/2023   Moderna Sars-Covid-2 Vaccination 04/07/2019, 05/05/2019, 02/09/2020   PFIZER(Purple Top)SARS-COV-2 Vaccination 01/06/2021   PPD Test 07/17/2010, 04/19/2015, 04/25/2015   Pfizer Covid-19 Vaccine Bivalent Booster 52yrs & up 07/19/2022   Pneumococcal Conjugate-13 11/10/2013   Pneumococcal Polysaccharide-23 12/27/2010, 12/05/2016   RSV,unspecified 03/28/2022   Tdap 04/15/2017   Zoster Recombinant(Shingrix) 04/09/2017, 06/18/2017   Pertinent  Health Maintenance Due  Topic Date Due   INFLUENZA VACCINE  10/25/2023   DEXA SCAN  Completed      06/06/2022    4:55 PM 07/09/2022    1:44 PM 08/29/2022    3:31 PM 04/18/2023   12:52 PM 10/04/2023   10:46 AM  Fall Risk  Falls in the past year? 1 0 1 0 0  Was there an injury with Fall? 1 0 1 0 0  Fall Risk Category Calculator 3 0 2 0 0  Patient at Risk for Falls Due to History of fall(s);Impaired balance/gait;Impaired mobility History of fall(s);Impaired balance/gait;Impaired mobility   History of fall(s)  Fall risk Follow up Falls evaluation completed;Education provided;Falls  prevention discussed Falls evaluation completed Falls evaluation completed Falls evaluation completed Falls evaluation completed   Functional Status Survey:    Vitals:   10/22/23 1443 10/22/23 1444  BP: (!) 148/81 123/83  Pulse: (!) 102   SpO2: 95%   Weight: 120 lb 14.4 oz (54.8 kg)   Height: 5' 2 (1.575 m)    Body mass index is 22.11 kg/m. Physical Exam  Labs reviewed: Recent Labs    02/05/23 1146 08/02/23 0000 09/19/23 0000 10/02/23 0000  NA 138 133* 135* 134*  K 4.1 4.2 4.3 4.3  CL 104 99 98* 97*  CO2 24 23* 16 20  GLUCOSE 96  --   --   --   BUN 34* 21 34* 32*  CREATININE 1.15* 0.9 1.0 1.2*  CALCIUM  9.8 9.2 8.8 9.1   Recent Labs    12/31/22 0000 02/05/23 1146 08/02/23 0000  AST 26 18 25   ALT 36* 19 22  ALKPHOS 104 91 118  BILITOT  --  0.8  --   PROT  --  7.1  --   ALBUMIN 4.1 4.1  --    Recent Labs    12/31/22 0000 02/05/23 1146 04/04/23 0000 08/02/23 0000 08/13/23 1130  WBC 6.4 7.6 5.8 5.8 7.6  NEUTROABS 3.70 4.5  --   --  4.3  HGB 10.6* 11.5* 11.0* 11.2* 11.8*  HCT 31* 34.6* 33* 34* 35.0*  MCV  --  102.4*  --   --  103.6*  PLT 157 163 144* 167 177   Lab Results  Component Value Date   TSH 2.92 09/13/2023   Lab Results  Component Value Date   HGBA1C 5.5 01/12/2013   Lab Results  Component Value Date   CHOL 114 08/02/2023   HDL 53 08/02/2023   LDLCALC 44 08/02/2023   TRIG 86 08/02/2023    Significant Diagnostic Results in last 30 days:  DG Chest 2 View Result Date: 10/02/2023 CLINICAL DATA:  Chronic heart failure. EXAM: CHEST - 2 VIEW COMPARISON:  Radiograph 08/06/2022, chest CT 03/31/2021 FINDINGS: Prior median sternotomy. Prosthetic cardiac valves. Borderline mild cardiomegaly, unchanged from prior. There is mild diffuse interstitial thickening, without  significant interval change. Ill-defined opacity at the right lung base, not definitively seen on prior. No pleural effusion. No pneumothorax. No acute osseous abnormalities are seen.  IMPRESSION: 1. Ill-defined opacity at the right lung base, not definitively seen on prior. This may represent atelectasis or pneumonia. 2. Mild diffuse interstitial thickening, without significant interval change. This may represent chronic lung disease versus pulmonary edema. 3. Borderline mild cardiomegaly, unchanged. Electronically Signed   By: Andrea Gasman M.D.   On: 10/02/2023 16:53   ECHOCARDIOGRAM COMPLETE Result Date: 09/24/2023    ECHOCARDIOGRAM REPORT   Patient Name:   XENIA NILE Date of Exam: 09/24/2023 Medical Rec #:  [id number]   Height:       62.0 in Accession #:    [id number]  Weight:       122.0 lb Date of Birth:  02/18/1932   BSA:          1.549 m Patient Age:    89 years    BP:           102/64 mmHg Patient Gender: F           HR:           98 bpm. Exam Location:  Outpatient Procedure: 2D Echo, 3D Echo, Cardiac Doppler, Color Doppler and Strain Analysis            (Both Spectral and Color Flow Doppler were utilized during            procedure). Indications:    CHF  History:        Patient has prior history of Echocardiogram examinations, most                 recent 02/12/2023. TIA and Stroke, Arrythmias:Atrial                 Fibrillation and Atrial Flutter, Signs/Symptoms:Shortness of                 Breath; Risk Factors:Hypertension, Non-Smoker and Dyslipidemia.                 MV #30 Physio Ring; TV # 28 TriAd Ring 2014, [place];Chronic                 HFpEF.                  Mitral Valve: 30 mm prosthetic annuloplasty ring valve is                 present in the mitral position. Procedure Date: 01/20/2013.  Sonographer:    Orvil Holmes RDCS Referring Phys: [id number] ROLLO JONELLE LOUDER IMPRESSIONS  1. Left ventricular ejection fraction, by estimation, is 45 to 50%. Left ventricular ejection fraction by 3D volume is 46 %. The left ventricle has mildly decreased function. The left ventricle demonstrates global hypokinesis. There is mild left ventricular hypertrophy. Left ventricular diastolic  parameters are indeterminate.  2. Right ventricular systolic function is normal. The right ventricular size is moderately enlarged.  3. Left atrial size was severely dilated.  4. Right atrial size was mildly dilated.  5. The mitral valve has been repaired/replaced. Mild mitral valve regurgitation. No evidence of mitral stenosis. There is a 30 mm prosthetic annuloplasty ring present in the mitral position. Procedure Date: 01/20/2013.  6. The tricuspid valve is has been repaired/replaced. The tricuspid valve is status post repair with an annuloplasty ring. Tricuspid valve regurgitation is mild to moderate.  7. The aortic valve is tricuspid. Aortic  valve regurgitation is not visualized. No aortic stenosis is present.  8. The inferior vena cava is normal in size with greater than 50% respiratory variability, suggesting right atrial pressure of 3 mmHg. FINDINGS  Left Ventricle: Left ventricular ejection fraction, by estimation, is 45 to 50%. Left ventricular ejection fraction by 3D volume is 46 %. The left ventricle has mildly decreased function. The left ventricle demonstrates global hypokinesis. Global longitudinal strain performed but not reported based on interpreter judgement due to suboptimal tracking. The left ventricular internal cavity size was normal in size. There is mild left ventricular hypertrophy. Left ventricular diastolic parameters are indeterminate. Right Ventricle: The right ventricular size is moderately enlarged. No increase in right ventricular wall thickness. Right ventricular systolic function is normal. Left Atrium: Left atrial size was severely dilated. Right Atrium: Right atrial size was mildly dilated. Pericardium: There is no evidence of pericardial effusion. Mitral Valve: The mitral valve has been repaired/replaced. Mild mitral valve regurgitation. There is a 30 mm prosthetic annuloplasty ring present in the mitral position. Procedure Date: 01/20/2013. No evidence of mitral valve stenosis.  Tricuspid Valve: The tricuspid valve is has been repaired/replaced. Tricuspid valve regurgitation is mild to moderate. No evidence of tricuspid stenosis. The tricuspid valve is status post repair with an annuloplasty ring. Aortic Valve: The aortic valve is tricuspid. Aortic valve regurgitation is not visualized. No aortic stenosis is present. Pulmonic Valve: The pulmonic valve was normal in structure. Pulmonic valve regurgitation is not visualized. No evidence of pulmonic stenosis. Aorta: The aortic root is normal in size and structure. Venous: The inferior vena cava is normal in size with greater than 50% respiratory variability, suggesting right atrial pressure of 3 mmHg. IAS/Shunts: No atrial level shunt detected by color flow Doppler. Additional Comments: 3D was performed not requiring image post processing on an independent workstation and was abnormal.  LEFT VENTRICLE PLAX 2D LVIDd:         3.67 cm         Diastology LVIDs:         2.39 cm         LV e' medial:    4.14 cm/s LV PW:         1.30 cm         LV E/e' medial:  30.7 LV IVS:        0.98 cm         LV e' lateral:   8.19 cm/s LVOT diam:     2.00 cm         LV E/e' lateral: 15.5 LV SV:         28 LV SV Index:   18 LVOT Area:     3.14 cm        3D Volume EF                                LV 3D EF:    Left                                             ventricul  ar                                             ejection                                             fraction                                             by 3D                                             volume is                                             46 %.                                 3D Volume EF:                                3D EF:        46 %                                LV EDV:       91 ml                                LV ESV:       49 ml                                LV SV:        42 ml RIGHT VENTRICLE RV Basal diam:  5.10 cm RV Mid  diam:    3.46 cm RV S prime:     6.24 cm/s TAPSE (M-mode): 0.8 cm LEFT ATRIUM             Index        RIGHT ATRIUM           Index LA diam:        5.20 cm 3.36 cm/m   RA Area:     17.40 cm LA Vol (A2C):   87.2 ml 56.28 ml/m  RA Volume:   48.80 ml  31.49 ml/m LA Vol (A4C):   82.6 ml 53.31 ml/m LA Biplane Vol: 84.8 ml 54.73 ml/m  AORTIC VALVE LVOT Vmax:   60.30 cm/s LVOT Vmean:  39.500 cm/s LVOT VTI:    0.090 m  AORTA Ao Root diam: 3.20 cm Ao Asc diam:  3.60 cm MITRAL VALVE  TRICUSPID VALVE MV Area (PHT): 4.68 cm       TR Peak grad:   50.7 mmHg MV Decel Time: 162 msec       TR Vmax:        356.00 cm/s MR Peak grad:    77.8 mmHg MR Mean grad:    53.0 mmHg    SHUNTS MR Vmax:         441.00 cm/s  Systemic VTI:  0.09 m MR Vmean:        347.0 cm/s   Systemic Diam: 2.00 cm MR PISA:         3.08 cm MR PISA Eff ROA: 22 mm MR PISA Radius:  0.70 cm MV E velocity: 127.00 cm/s MV A velocity: 48.40 cm/s MV E/A ratio:  2.62 Oneil Parchment MD Electronically signed by Oneil Parchment MD Signature Date/Time: 09/24/2023/12:05:21 PM    Final     Assessment/Plan There are no diagnoses linked to this encounter.   Family/ staff Communication: ***  Labs/tests ordered:  ***

## 2023-10-23 MED ORDER — FUROSEMIDE 20 MG PO TABS: 20.0000 mg | ORAL_TABLET | Freq: Every day | ORAL | Status: DC

## 2023-10-23 MED ORDER — FUROSEMIDE 20 MG PO TABS: 20.0000 mg | ORAL_TABLET | ORAL | Status: DC

## 2023-10-24 DIAGNOSIS — F01A0 Vascular dementia, mild, without behavioral disturbance, psychotic disturbance, mood disturbance, and anxiety: Secondary | ICD-10-CM | POA: Diagnosis not present

## 2023-10-24 DIAGNOSIS — R2689 Other abnormalities of gait and mobility: Secondary | ICD-10-CM | POA: Diagnosis not present

## 2023-10-24 DIAGNOSIS — R41841 Cognitive communication deficit: Secondary | ICD-10-CM | POA: Diagnosis not present

## 2023-10-28 DIAGNOSIS — R41841 Cognitive communication deficit: Secondary | ICD-10-CM | POA: Diagnosis not present

## 2023-10-28 DIAGNOSIS — F01A0 Vascular dementia, mild, without behavioral disturbance, psychotic disturbance, mood disturbance, and anxiety: Secondary | ICD-10-CM | POA: Diagnosis not present

## 2023-11-04 DIAGNOSIS — F01A0 Vascular dementia, mild, without behavioral disturbance, psychotic disturbance, mood disturbance, and anxiety: Secondary | ICD-10-CM | POA: Diagnosis not present

## 2023-11-04 DIAGNOSIS — R41841 Cognitive communication deficit: Secondary | ICD-10-CM | POA: Diagnosis not present

## 2023-11-14 DIAGNOSIS — R41841 Cognitive communication deficit: Secondary | ICD-10-CM | POA: Diagnosis not present

## 2023-11-14 DIAGNOSIS — F01A0 Vascular dementia, mild, without behavioral disturbance, psychotic disturbance, mood disturbance, and anxiety: Secondary | ICD-10-CM | POA: Diagnosis not present

## 2023-11-16 ENCOUNTER — Emergency Department (HOSPITAL_COMMUNITY)

## 2023-11-16 ENCOUNTER — Inpatient Hospital Stay (HOSPITAL_COMMUNITY)
Admission: EM | Admit: 2023-11-16 | Discharge: 2023-11-20 | DRG: 958 | Disposition: A | Discharge status: Skilled Nursing Facility | Source: Skilled Nursing Facility | Attending: General Surgery | Admitting: General Surgery

## 2023-11-16 ENCOUNTER — Other Ambulatory Visit: Payer: Self-pay

## 2023-11-16 ENCOUNTER — Encounter (HOSPITAL_COMMUNITY): Payer: Self-pay | Admitting: Emergency Medicine

## 2023-11-16 DIAGNOSIS — S32591A Other specified fracture of right pubis, initial encounter for closed fracture: Secondary | ICD-10-CM | POA: Diagnosis present

## 2023-11-16 DIAGNOSIS — S3992XA Unspecified injury of lower back, initial encounter: Secondary | ICD-10-CM | POA: Diagnosis not present

## 2023-11-16 DIAGNOSIS — Z85038 Personal history of other malignant neoplasm of large intestine: Secondary | ICD-10-CM | POA: Diagnosis not present

## 2023-11-16 DIAGNOSIS — S35511A Injury of right iliac artery, initial encounter: Principal | ICD-10-CM | POA: Diagnosis present

## 2023-11-16 DIAGNOSIS — S334XXA Traumatic rupture of symphysis pubis, initial encounter: Secondary | ICD-10-CM | POA: Diagnosis not present

## 2023-11-16 DIAGNOSIS — Z79899 Other long term (current) drug therapy: Secondary | ICD-10-CM | POA: Diagnosis not present

## 2023-11-16 DIAGNOSIS — S3289XA Fracture of other parts of pelvis, initial encounter for closed fracture: Secondary | ICD-10-CM | POA: Diagnosis not present

## 2023-11-16 DIAGNOSIS — M069 Rheumatoid arthritis, unspecified: Secondary | ICD-10-CM | POA: Diagnosis present

## 2023-11-16 DIAGNOSIS — Y998 Other external cause status: Secondary | ICD-10-CM | POA: Diagnosis not present

## 2023-11-16 DIAGNOSIS — K219 Gastro-esophageal reflux disease without esophagitis: Secondary | ICD-10-CM | POA: Diagnosis present

## 2023-11-16 DIAGNOSIS — G2581 Restless legs syndrome: Secondary | ICD-10-CM | POA: Diagnosis present

## 2023-11-16 DIAGNOSIS — E871 Hypo-osmolality and hyponatremia: Secondary | ICD-10-CM | POA: Diagnosis present

## 2023-11-16 DIAGNOSIS — Z7401 Bed confinement status: Secondary | ICD-10-CM | POA: Diagnosis not present

## 2023-11-16 DIAGNOSIS — M25551 Pain in right hip: Secondary | ICD-10-CM | POA: Diagnosis not present

## 2023-11-16 DIAGNOSIS — F29 Unspecified psychosis not due to a substance or known physiological condition: Secondary | ICD-10-CM | POA: Diagnosis not present

## 2023-11-16 DIAGNOSIS — S32511A Fracture of superior rim of right pubis, initial encounter for closed fracture: Secondary | ICD-10-CM | POA: Diagnosis present

## 2023-11-16 DIAGNOSIS — S32301A Unspecified fracture of right ilium, initial encounter for closed fracture: Secondary | ICD-10-CM | POA: Diagnosis not present

## 2023-11-16 DIAGNOSIS — E039 Hypothyroidism, unspecified: Secondary | ICD-10-CM | POA: Diagnosis present

## 2023-11-16 DIAGNOSIS — Z8249 Family history of ischemic heart disease and other diseases of the circulatory system: Secondary | ICD-10-CM

## 2023-11-16 DIAGNOSIS — D62 Acute posthemorrhagic anemia: Secondary | ICD-10-CM | POA: Diagnosis not present

## 2023-11-16 DIAGNOSIS — Z91048 Other nonmedicinal substance allergy status: Secondary | ICD-10-CM

## 2023-11-16 DIAGNOSIS — S299XXA Unspecified injury of thorax, initial encounter: Secondary | ICD-10-CM | POA: Diagnosis not present

## 2023-11-16 DIAGNOSIS — N3281 Overactive bladder: Secondary | ICD-10-CM | POA: Diagnosis present

## 2023-11-16 DIAGNOSIS — F419 Anxiety disorder, unspecified: Secondary | ICD-10-CM | POA: Diagnosis present

## 2023-11-16 DIAGNOSIS — S32599A Other specified fracture of unspecified pubis, initial encounter for closed fracture: Secondary | ICD-10-CM | POA: Diagnosis not present

## 2023-11-16 DIAGNOSIS — Y92410 Unspecified street and highway as the place of occurrence of the external cause: Secondary | ICD-10-CM

## 2023-11-16 DIAGNOSIS — K5730 Diverticulosis of large intestine without perforation or abscess without bleeding: Secondary | ICD-10-CM | POA: Diagnosis not present

## 2023-11-16 DIAGNOSIS — Z85828 Personal history of other malignant neoplasm of skin: Secondary | ICD-10-CM

## 2023-11-16 DIAGNOSIS — I700 Atherosclerosis of aorta: Secondary | ICD-10-CM | POA: Diagnosis not present

## 2023-11-16 DIAGNOSIS — W010XXA Fall on same level from slipping, tripping and stumbling without subsequent striking against object, initial encounter: Secondary | ICD-10-CM | POA: Diagnosis present

## 2023-11-16 DIAGNOSIS — I482 Chronic atrial fibrillation, unspecified: Secondary | ICD-10-CM | POA: Diagnosis present

## 2023-11-16 DIAGNOSIS — F01A0 Vascular dementia, mild, without behavioral disturbance, psychotic disturbance, mood disturbance, and anxiety: Secondary | ICD-10-CM | POA: Diagnosis present

## 2023-11-16 DIAGNOSIS — S32810A Multiple fractures of pelvis with stable disruption of pelvic ring, initial encounter for closed fracture: Secondary | ICD-10-CM

## 2023-11-16 DIAGNOSIS — Y92129 Unspecified place in nursing home as the place of occurrence of the external cause: Secondary | ICD-10-CM

## 2023-11-16 DIAGNOSIS — M25572 Pain in left ankle and joints of left foot: Secondary | ICD-10-CM | POA: Diagnosis not present

## 2023-11-16 DIAGNOSIS — I480 Paroxysmal atrial fibrillation: Secondary | ICD-10-CM | POA: Diagnosis present

## 2023-11-16 DIAGNOSIS — R4182 Altered mental status, unspecified: Secondary | ICD-10-CM | POA: Diagnosis not present

## 2023-11-16 DIAGNOSIS — Z888 Allergy status to other drugs, medicaments and biological substances status: Secondary | ICD-10-CM

## 2023-11-16 DIAGNOSIS — Z8673 Personal history of transient ischemic attack (TIA), and cerebral infarction without residual deficits: Secondary | ICD-10-CM

## 2023-11-16 DIAGNOSIS — N9489 Other specified conditions associated with female genital organs and menstrual cycle: Secondary | ICD-10-CM | POA: Diagnosis present

## 2023-11-16 DIAGNOSIS — I10 Essential (primary) hypertension: Secondary | ICD-10-CM | POA: Diagnosis present

## 2023-11-16 DIAGNOSIS — W19XXXA Unspecified fall, initial encounter: Secondary | ICD-10-CM | POA: Diagnosis not present

## 2023-11-16 DIAGNOSIS — Z91030 Bee allergy status: Secondary | ICD-10-CM | POA: Diagnosis not present

## 2023-11-16 DIAGNOSIS — Z7901 Long term (current) use of anticoagulants: Secondary | ICD-10-CM

## 2023-11-16 DIAGNOSIS — Z7969 Long term (current) use of other immunomodulators and immunosuppressants: Secondary | ICD-10-CM

## 2023-11-16 DIAGNOSIS — M19012 Primary osteoarthritis, left shoulder: Secondary | ICD-10-CM | POA: Diagnosis present

## 2023-11-16 DIAGNOSIS — Z79890 Hormone replacement therapy (postmenopausal): Secondary | ICD-10-CM

## 2023-11-16 DIAGNOSIS — E785 Hyperlipidemia, unspecified: Secondary | ICD-10-CM | POA: Diagnosis present

## 2023-11-16 DIAGNOSIS — R9089 Other abnormal findings on diagnostic imaging of central nervous system: Secondary | ICD-10-CM | POA: Diagnosis not present

## 2023-11-16 DIAGNOSIS — Z9842 Cataract extraction status, left eye: Secondary | ICD-10-CM

## 2023-11-16 DIAGNOSIS — S199XXA Unspecified injury of neck, initial encounter: Secondary | ICD-10-CM | POA: Diagnosis not present

## 2023-11-16 DIAGNOSIS — S329XXA Fracture of unspecified parts of lumbosacral spine and pelvis, initial encounter for closed fracture: Secondary | ICD-10-CM | POA: Diagnosis present

## 2023-11-16 DIAGNOSIS — Y9301 Activity, walking, marching and hiking: Secondary | ICD-10-CM | POA: Diagnosis present

## 2023-11-16 DIAGNOSIS — S0990XA Unspecified injury of head, initial encounter: Secondary | ICD-10-CM | POA: Diagnosis not present

## 2023-11-16 DIAGNOSIS — Z981 Arthrodesis status: Secondary | ICD-10-CM

## 2023-11-16 DIAGNOSIS — R110 Nausea: Secondary | ICD-10-CM | POA: Diagnosis not present

## 2023-11-16 DIAGNOSIS — Z9841 Cataract extraction status, right eye: Secondary | ICD-10-CM

## 2023-11-16 DIAGNOSIS — I672 Cerebral atherosclerosis: Secondary | ICD-10-CM | POA: Diagnosis not present

## 2023-11-16 LAB — CBC WITH DIFFERENTIAL/PLATELET
Abs Immature Granulocytes: 0.1 K/uL — ABNORMAL HIGH (ref 0.00–0.07)
Basophils Absolute: 0.1 K/uL (ref 0.0–0.1)
Basophils Relative: 1 %
Eosinophils Absolute: 0.2 K/uL (ref 0.0–0.5)
Eosinophils Relative: 2 %
HCT: 32.1 % — ABNORMAL LOW (ref 36.0–46.0)
Hemoglobin: 10.6 g/dL — ABNORMAL LOW (ref 12.0–15.0)
Immature Granulocytes: 1 %
Lymphocytes Relative: 24 %
Lymphs Abs: 2.5 K/uL (ref 0.7–4.0)
MCH: 34.9 pg — ABNORMAL HIGH (ref 26.0–34.0)
MCHC: 33 g/dL (ref 30.0–36.0)
MCV: 105.6 fL — ABNORMAL HIGH (ref 80.0–100.0)
Monocytes Absolute: 1.4 K/uL — ABNORMAL HIGH (ref 0.1–1.0)
Monocytes Relative: 13 %
Neutro Abs: 6.3 K/uL (ref 1.7–7.7)
Neutrophils Relative %: 59 %
Platelets: 196 K/uL (ref 150–400)
RBC: 3.04 MIL/uL — ABNORMAL LOW (ref 3.87–5.11)
RDW: 16.2 % — ABNORMAL HIGH (ref 11.5–15.5)
WBC: 10.5 K/uL (ref 4.0–10.5)
nRBC: 0 % (ref 0.0–0.2)

## 2023-11-16 LAB — COMPREHENSIVE METABOLIC PANEL WITH GFR
ALT: 42 U/L (ref 0–44)
AST: 44 U/L — ABNORMAL HIGH (ref 15–41)
Albumin: 3.6 g/dL (ref 3.5–5.0)
Alkaline Phosphatase: 104 U/L (ref 38–126)
Anion gap: 12 (ref 5–15)
BUN: 28 mg/dL — ABNORMAL HIGH (ref 8–23)
CO2: 22 mmol/L (ref 22–32)
Calcium: 9.1 mg/dL (ref 8.9–10.3)
Chloride: 102 mmol/L (ref 98–111)
Creatinine, Ser: 1.04 mg/dL — ABNORMAL HIGH (ref 0.44–1.00)
GFR, Estimated: 51 mL/min — ABNORMAL LOW (ref >60)
Glucose, Bld: 121 mg/dL — ABNORMAL HIGH (ref 70–99)
Potassium: 4.1 mmol/L (ref 3.5–5.1)
Sodium: 136 mmol/L (ref 135–145)
Total Bilirubin: 0.8 mg/dL (ref 0.0–1.2)
Total Protein: 6.6 g/dL (ref 6.5–8.1)

## 2023-11-16 LAB — PROTIME-INR
INR: 1.2 (ref 0.8–1.2)
Prothrombin Time: 15.4 s — ABNORMAL HIGH (ref 11.4–15.2)

## 2023-11-16 NOTE — ED Notes (Signed)
 Trauma Response Nurse Documentation   Kathy Velez is a 89 y.o. female arriving to [facility] ED via EMS Non activated trauma attended by TRN, fall from SNF unwitnessed, approx 30 min downtime. Pt reports R hip pain. On eliquis , no head injury.  GCS 15.   History   Past Medical History:  Diagnosis Date   Allergy    seasonal   Anemia    Anxiety    Arthritis    Back   Asymptomatic menopausal state    Atrial fibrillation (HCC)    Cancer (HCC) 2006   Colon.  Basal Cell Skin cancer- right arm   Cataract    removed bilateral   Chronic atrial fibrillation (HCC)    Colon cancer (HCC)    Constipation due to pain medication therapy    after heart surgery   Deficiency of other specified B group vitamins    Diplopia    Disorientation, unspecified    Dysrhythmia    PAF   GERD (gastroesophageal reflux disease)    Heart murmur    Hyperlipidemia    Hypertension    Hypothyroidism    Malignant neoplasm of colon, unspecified (HCC)    Other specified diseases of blood and blood-forming organs    Other specified disorders of bone density and structure, unspecified forearm    Other thrombophilia (HCC)    per matrix   Overactive bladder    per Matrix   Personal history of other malignant neoplasm of large intestine    Presbycusis, bilateral    per matrix   RA (rheumatoid arthritis) (HCC)    Restless leg    Seizures (HCC)    after Heart Surgey   Stroke (HCC)    TIA- found by neurologist after    Valgus deformity, not elsewhere classified, right knee    per matrix   Vascular dementia, mild, without behavioral disturbance, psychotic disturbance, mood disturbance, and anxiety (HCC)    per matrix     Past Surgical History:  Procedure Laterality Date   ABDOMINAL HYSTERECTOMY  1970   Partial    COLON RESECTION  2006   cancer   COLON SURGERY     COLONOSCOPY     EYE SURGERY Bilateral    Cataract   MAXIMUM ACCESS (MAS)POSTERIOR LUMBAR INTERBODY FUSION (PLIF) 2 LEVEL N/A 04/12/2015    Procedure: Lumbar Three-Five Decompression, Pedicle Screw Fixation, and Posteriolateral Arthrodesis;  Surgeon: Fairy Levels, MD;  Location: [facility];  Service: Neurosurgery;  Laterality: N/A;  L3-4 L4-5 Maximum access posterior lumbar fusion, possible interbodies and resection of synovial cyst at L4-5   MITRAL VALVE REPAIR  01/20/2013   Gore-tex cords to P1, P2, and P3. Magic suture to posterior medial commisure, #30 Physio 1 ring. Done in [place]    TONSILLECTOMY     about 1940   TRICUSPID VALVE SURGERY  01/20/2013   #28 TriAd ring done in [place]        Initial Focused Assessment (If applicable, or please see trauma documentation): Alert pt presenting from SNF after unwitnessed fall, complaining of right hip pain. Denies hitting her head, reports she was attempting to get up to go to bathroom. Outward rotation noted, CMS intact.   Airway patent, BS clear No obvious uncontrolled hemorrhage GCS 15    Event Summary: See focused assessment. Non activated trauma pt with R hip pain, outward rotation. Concern for possible fx. Plan for CT head d/t hx anticoag. POC discussed with Kathy Velez.   Bedside handoff with ED RN Kathy Velez.  Jazion Atteberry O Omer Puccinelli  Trauma Response RN  Please call TRN at [phone] for further assistance.

## 2023-11-16 NOTE — ED Provider Notes (Signed)
  EMERGENCY DEPARTMENT AT [facility] Provider Note   CSN: [id number] Arrival date & time: 11/16/23  2202     Patient presents with: No chief complaint on file.   Kathy Velez is a 89 y.o. female.   89 year old female brought in by EMS from facility where patient lives in an apartment and is ambulatory with a walker for evaluation after a fall. Patient states she is on a fluid pill, she filled her pullup and was going to get herself cleaned up. She was walking, not sure if using her walker, she slipped on flower petals on the floor when she turned and fell. She believes she did loose consciousness briefly. She does not believe she hit her head. She was found down by staff with pain in her right hip. No other injuries. Patient is on Eliquis .        Prior to Admission medications   Medication Sig Start Date End Date Taking? Authorizing Provider  mirtazapine  (REMERON ) 7.5 MG tablet Take 7.5 mg by mouth at bedtime. 11/01/23  Yes [provider]  acetaminophen  (TYLENOL ) 500 MG tablet Take 1,000 mg by mouth in the morning and at bedtime.    [provider]  amoxicillin (AMOXIL) 500 MG capsule Take 2,000 mg by mouth as needed. 03/02/23   [provider]  apixaban  (ELIQUIS ) 2.5 MG TABS tablet Take 1 tablet (2.5 mg total) by mouth 2 (two) times daily. 12/14/22   Croitoru, Mihai, MD  atorvastatin  (LIPITOR) 20 MG tablet Take by mouth daily.    [provider]  Cholecalciferol  (VITAMIN D ) 50 MCG (2000 UT) CAPS Take 2,000 Units by mouth in the morning.    [provider]  cloNIDine  (CATAPRES ) 0.1 MG tablet Take 1 tablet (0.1 mg total) by mouth daily as needed. If SBP >180 only 02/08/22   Darlean Maus, NP  ENBREL SURECLICK 50 MG/ML injection Inject 50 mg into the skin once a week.    [provider]  furosemide  (LASIX ) 20 MG tablet Take 1 tablet (20 mg total) by mouth every other day. Can take an additional does of lasix  for a  weight gain 2-3 lbs overnight or increased swelling of the legs. 10/23/23   Fargo, Amy E, NP  furosemide  (LASIX ) 20 MG tablet Take 1 tablet (20 mg total) by mouth daily for 2 days. 10/22/23 10/24/23  Fargo, Amy E, NP  Iron , Ferrous Sulfate , 325 (65 Fe) MG TABS Take 325 mg by mouth daily. 03/03/22   Darlean Maus, NP  leflunomide  (ARAVA ) 20 MG tablet Take 20 mg by mouth in the morning. 10/05/19   [provider]  levalbuterol  (XOPENEX ) 0.63 MG/3ML nebulizer solution Take 3 mLs (0.63 mg total) by nebulization every 6 (six) hours as needed for wheezing or shortness of breath. 08/23/23   Darlean Maus, NP  levothyroxine  (SYNTHROID ) 88 MCG tablet Take 88 mcg by mouth daily before breakfast.    [provider]  melatonin 5 MG TABS Take 5 mg by mouth at bedtime.    [provider]  METOPROLOL  SUCCINATE PO Take 150 mg by mouth in the morning.    [provider]  mirtazapine  (REMERON ) 15 MG tablet Take 0.5 tablets (7.5 mg total) by mouth at bedtime. Take 7.5 mg each night for two weeks then discontinue 10/21/23   Wert, Christina, NP  Multiple Vitamin (MULTIVITAMIN ADULT PO) Take 1 tablet by mouth every morning.    [provider]  Multiple Vitamins-Minerals (PRESERVISION AREDS 2) CAPS  Take 1 capsule by mouth in the morning and at bedtime.    [provider]  omeprazole  (PRILOSEC) 40 MG capsule TAKE 1 CAPSULE DAILY 10/17/21   Charlanne Fredia CROME, MD  UNABLE TO FIND Apply topically daily. Med Name: JOBST hose    [provider]  valsartan  (DIOVAN ) 160 MG tablet Take 160 mg by mouth daily.    [provider]  vitamin B-12 (CYANOCOBALAMIN ) 1000 MCG tablet Take 1,000 mcg by mouth in the morning.    [provider]    Allergies: Claritin [loratadine], Codeine, Gabapentin , Molds & smuts, Pollen extract, Bee venom, and Wasp venom    Review of Systems Negative except as per HPI Updated Vital Signs BP (!) 125/90   Pulse 98   Temp 97.9 F  (36.6 C)   Resp 13   SpO2 98%   Physical Exam Vitals and nursing note reviewed.  Constitutional:      General: She is not in acute distress.    Appearance: She is well-developed. She is not diaphoretic.  HENT:     Head: Normocephalic and atraumatic.     Mouth/Throat:     Mouth: Mucous membranes are moist.  Eyes:     Extraocular Movements: Extraocular movements intact.     Pupils: Pupils are equal, round, and reactive to light.  Cardiovascular:     Pulses: Normal pulses.  Pulmonary:     Effort: Pulmonary effort is normal.  Abdominal:     Palpations: Abdomen is soft.     Tenderness: There is no abdominal tenderness.  Musculoskeletal:        General: Tenderness, deformity and signs of injury present. No swelling.     Cervical back: Normal range of motion and neck supple. No tenderness or bony tenderness.     Thoracic back: No tenderness or bony tenderness.     Lumbar back: No tenderness or bony tenderness.     Right knee: No bony tenderness or crepitus. No tenderness.     Left knee: Normal.     Right lower leg: No edema.     Left lower leg: No edema.     Right foot: Normal.     Left foot: Normal.     Comments: Right leg is shortened and externally rotated with pain in the right hip with rotation of the leg and palpation over the right hip.   Skin:    General: Skin is warm and dry.     Findings: No erythema or rash.  Neurological:     Mental Status: She is alert and oriented to person, place, and time.     Sensory: No sensory deficit.  Psychiatric:        Behavior: Behavior normal.     (all labs ordered are listed, but only abnormal results are displayed) Labs Reviewed  CBC WITH DIFFERENTIAL/PLATELET - Abnormal; Notable for the following components:      Result Value   RBC 3.04 (*)    Hemoglobin 10.6 (*)    HCT 32.1 (*)    MCV 105.6 (*)    MCH 34.9 (*)    RDW 16.2 (*)    Monocytes Absolute 1.4 (*)    Abs Immature Granulocytes 0.10 (*)    All other components  within normal limits  COMPREHENSIVE METABOLIC PANEL WITH GFR - Abnormal; Notable for the following components:   Glucose, Bld 121 (*)    BUN 28 (*)    Creatinine, Ser 1.04 (*)    AST 44 (*)  GFR, Estimated 51 (*)    All other components within normal limits  PROTIME-INR - Abnormal; Notable for the following components:   Prothrombin  Time 15.4 (*)    All other components within normal limits  HEMOGLOBIN AND HEMATOCRIT, BLOOD - Abnormal; Notable for the following components:   Hemoglobin 9.5 (*)    HCT 30.2 (*)    All other components within normal limits  URINALYSIS, ROUTINE W REFLEX MICROSCOPIC  TYPE AND SCREEN    EKG: None  Radiology: CT CHEST W CONTRAST Result Date: 11/17/2023 CLINICAL DATA:  Fall, with suspected acute comminuted right inferior pubic ramus fracture, concern is for urinary injury. Blunt polytrauma with potential for acute chest trauma as well. EXAM: CT CHEST WITH CONTRAST CT ABDOMEN AND PELVIS WITH AND WITHOUT CONTRAST TECHNIQUE: Multidetector CT imaging of the chest was performed during intravenous contrast administration. Multidetector CT imaging of the abdomen and pelvis was performed prior to and following the standard protocol before and during bolus administration of intravenous contrast. RADIATION DOSE REDUCTION: This exam was performed according to the departmental dose-optimization program which includes automated exposure control, adjustment of the mA and/or kV according to patient size and/or use of iterative reconstruction technique. CONTRAST:  OMNIPAQUE  IOHEXOL  350 MG/ML SOLN COMPARISON:  Most recent chest x-ray was PA Lat chest 09/23/2023, before that chest radiograph 08/06/2022. Comparison is made with the chest, abdomen and pelvis CT with contrast 03/31/2021, abdomen and pelvis CT with IV contrast 03/06/2022. FINDINGS: CT CHEST FINDINGS Cardiovascular: The heart is slightly enlarged. Again noted are sternotomy sutures with prosthetic tricuspid and  mitral valves. There is chronic left atrial enlargement and central venous prominence. Patchy three-vessel coronary artery calcification. Enlarged pulmonary trunk again measures 3.6 cm indicating arterial hypertension. There is no central embolus on this nontailored exam. There is atherosclerosis in the aorta and great vessels without aneurysm, stenosis or dissection. No pericardial effusion. Mediastinum/Nodes: Small small hiatal hernia. Unremarkable esophagus. Thyroid  gland is not seen and could be removed, ablated or extremely atrophic. There is no intrathoracic or axillary adenopathy. The trachea is ectatic but clear. There is tracheobronchial chondrocalcinosis. Lungs/Pleura: There is biapical symmetric pleuroparenchymal scarring, calcifications and bronchiolectasis. There is mild centrilobular emphysema in the upper lobes, and subpleural reticulation in the lung fields with an upper lobe predominance consistent with chronic interstitial lung disease. There is increased consolidation posteriorly in the right middle lobe which is most likely either due to pneumonia or aspiration. There is increased posterior atelectasis in the basilar lower lobes. Interval new 6 mm subpleural left upper lobe nodule on 5:55. There is a new pleural-based 5 mm left lower lobe nodule on 5:117. There is new branching nodularity in the posterolateral left base consistent with small airway impactions. Similar new branching nodularity right lower lobe on 5:112, anterior left upper lobe on 5:48 and in the lingula on 5:75. There is no pleural effusion.  No pneumothorax. Musculoskeletal: Osteopenia, mild kyphosis and degenerative change thoracic spine. No acute or significant osseous findings. The CT ABDOMEN AND PELVIS FINDINGS Hepatobiliary: No focal liver abnormality is seen. No gallstones, gallbladder wall thickening, or biliary dilatation. Pancreas: Unremarkable. No pancreatic ductal dilatation or surrounding inflammatory changes.  Spleen: Normal in size without focal abnormality. Adrenals/Urinary Tract: No adrenal mass is seen. There is no renal mass enhancement. Stable 1 cm Bosniak 1 cyst anterior right kidney. Occasional subcentimeter Bosniak 2 cortical cysts of both kidneys are too small to characterize. There is cortical thinning in both kidneys. No urinary stone or obstruction. The bladder  wall is unremarkable, but the bladder is displaced into the left hemipelvis and mildly compressed by a sizable right perivesical extraperitoneal hematoma. No contrast extravasation outside of the bladder is seen. Stomach/Bowel: Prior partial right hemicolectomy with primary ileocolic anastomosis. No dilatation or overt wall thickening. Advanced sigmoid diverticulosis without evidence of diverticulitis. Vascular/Lymphatic: Large hematoma in the right hemipelvis along side the bladder, measuring 9.4 x 6.7 x 9.4 cm with active bleeding into the hematoma and scattered contrast levels on the delayed exam. On the initial arterial phase images there is curvilinear contrast within the collection, but it could not be traced back to a specific vessel. Additional free hemoperitoneum collects above the bladder dome to the left and right. Reproductive: Status post hysterectomy. No adnexal masses. Other: Free hemorrhage above the bladder and hematoma to the right of the bladder as above. No free air. Musculoskeletal: Comminuted fracture, not significantly displaced involving medial aspect of the right superior pubic ramus and the mid to medial aspect of the right inferior pubic ramus, with swelling in the adjacent right obturator internus and externus. There is osteopenia, degenerative and postsurgical change of the spine. No other regional skeletal fracture is evident. IMPRESSION: 1. Hematoma in the right hemipelvis along side the bladder, measuring 9.4 x 6.7 x 9.4 cm, with active bleeding into the hematoma and scattered contrast levels on the delayed exam. The  active bleeding could not be traced back to a specific vessel. 2. Comminuted fractures of the right superior and inferior pubic rami. 3. No urinary bladder extravasation is seen. 4. No other acute trauma related findings in the chest, abdomen or pelvis. 5. Chronic interstitial lung disease with increased consolidation in the posterior right middle lobe which could be due to pneumonia or aspiration. 6. New 6 mm left upper lobe and 5 mm left lower lobe nodules. Follow-up as indicated taking into account age and life expectancy. 7. Scattered peripheral branching nodularity new from prior study consistent with small airway impactions. 8. Aortic and coronary artery atherosclerosis. 9. Cardiomegaly with chronic left atrial enlargement and central venous prominence. Tricuspid and mitral prosthesis. 10. Enlarged pulmonary trunk indicating arterial hypertension. 11. Small hiatal hernia. 12. Advanced sigmoid diverticulosis. 13. Osteopenia and degenerative change. 14. Emphysema. 15. Critical Value/emergent results were called by telephone at the time of interpretation on 11/17/2023 at 4:09 am to provider [facility] , who verbally acknowledged these results. Aortic Atherosclerosis (ICD10-I70.0) and Emphysema (ICD10-J43.9). Electronically Signed   By: Francis Quam M.D.   On: 11/17/2023 04:17   CT ABDOMEN PELVIS W WO CONTRAST Result Date: 11/17/2023 CLINICAL DATA:  Fall, with suspected acute comminuted right inferior pubic ramus fracture, concern is for urinary injury. Blunt polytrauma with potential for acute chest trauma as well. EXAM: CT CHEST WITH CONTRAST CT ABDOMEN AND PELVIS WITH AND WITHOUT CONTRAST TECHNIQUE: Multidetector CT imaging of the chest was performed during intravenous contrast administration. Multidetector CT imaging of the abdomen and pelvis was performed prior to and following the standard protocol before and during bolus administration of intravenous contrast. RADIATION DOSE REDUCTION: This exam was  performed according to the departmental dose-optimization program which includes automated exposure control, adjustment of the mA and/or kV according to patient size and/or use of iterative reconstruction technique. CONTRAST:  OMNIPAQUE  IOHEXOL  350 MG/ML SOLN COMPARISON:  Most recent chest x-ray was PA Lat chest 09/23/2023, before that chest radiograph 08/06/2022. Comparison is made with the chest, abdomen and pelvis CT with contrast 03/31/2021, abdomen and pelvis CT with IV contrast 03/06/2022. FINDINGS:  CT CHEST FINDINGS Cardiovascular: The heart is slightly enlarged. Again noted are sternotomy sutures with prosthetic tricuspid and mitral valves. There is chronic left atrial enlargement and central venous prominence. Patchy three-vessel coronary artery calcification. Enlarged pulmonary trunk again measures 3.6 cm indicating arterial hypertension. There is no central embolus on this nontailored exam. There is atherosclerosis in the aorta and great vessels without aneurysm, stenosis or dissection. No pericardial effusion. Mediastinum/Nodes: Small small hiatal hernia. Unremarkable esophagus. Thyroid  gland is not seen and could be removed, ablated or extremely atrophic. There is no intrathoracic or axillary adenopathy. The trachea is ectatic but clear. There is tracheobronchial chondrocalcinosis. Lungs/Pleura: There is biapical symmetric pleuroparenchymal scarring, calcifications and bronchiolectasis. There is mild centrilobular emphysema in the upper lobes, and subpleural reticulation in the lung fields with an upper lobe predominance consistent with chronic interstitial lung disease. There is increased consolidation posteriorly in the right middle lobe which is most likely either due to pneumonia or aspiration. There is increased posterior atelectasis in the basilar lower lobes. Interval new 6 mm subpleural left upper lobe nodule on 5:55. There is a new pleural-based 5 mm left lower lobe nodule on 5:117. There  is new branching nodularity in the posterolateral left base consistent with small airway impactions. Similar new branching nodularity right lower lobe on 5:112, anterior left upper lobe on 5:48 and in the lingula on 5:75. There is no pleural effusion.  No pneumothorax. Musculoskeletal: Osteopenia, mild kyphosis and degenerative change thoracic spine. No acute or significant osseous findings. The CT ABDOMEN AND PELVIS FINDINGS Hepatobiliary: No focal liver abnormality is seen. No gallstones, gallbladder wall thickening, or biliary dilatation. Pancreas: Unremarkable. No pancreatic ductal dilatation or surrounding inflammatory changes. Spleen: Normal in size without focal abnormality. Adrenals/Urinary Tract: No adrenal mass is seen. There is no renal mass enhancement. Stable 1 cm Bosniak 1 cyst anterior right kidney. Occasional subcentimeter Bosniak 2 cortical cysts of both kidneys are too small to characterize. There is cortical thinning in both kidneys. No urinary stone or obstruction. The bladder wall is unremarkable, but the bladder is displaced into the left hemipelvis and mildly compressed by a sizable right perivesical extraperitoneal hematoma. No contrast extravasation outside of the bladder is seen. Stomach/Bowel: Prior partial right hemicolectomy with primary ileocolic anastomosis. No dilatation or overt wall thickening. Advanced sigmoid diverticulosis without evidence of diverticulitis. Vascular/Lymphatic: Large hematoma in the right hemipelvis along side the bladder, measuring 9.4 x 6.7 x 9.4 cm with active bleeding into the hematoma and scattered contrast levels on the delayed exam. On the initial arterial phase images there is curvilinear contrast within the collection, but it could not be traced back to a specific vessel. Additional free hemoperitoneum collects above the bladder dome to the left and right. Reproductive: Status post hysterectomy. No adnexal masses. Other: Free hemorrhage above the bladder  and hematoma to the right of the bladder as above. No free air. Musculoskeletal: Comminuted fracture, not significantly displaced involving medial aspect of the right superior pubic ramus and the mid to medial aspect of the right inferior pubic ramus, with swelling in the adjacent right obturator internus and externus. There is osteopenia, degenerative and postsurgical change of the spine. No other regional skeletal fracture is evident. IMPRESSION: 1. Hematoma in the right hemipelvis along side the bladder, measuring 9.4 x 6.7 x 9.4 cm, with active bleeding into the hematoma and scattered contrast levels on the delayed exam. The active bleeding could not be traced back to a specific vessel. 2. Comminuted fractures of the right  superior and inferior pubic rami. 3. No urinary bladder extravasation is seen. 4. No other acute trauma related findings in the chest, abdomen or pelvis. 5. Chronic interstitial lung disease with increased consolidation in the posterior right middle lobe which could be due to pneumonia or aspiration. 6. New 6 mm left upper lobe and 5 mm left lower lobe nodules. Follow-up as indicated taking into account age and life expectancy. 7. Scattered peripheral branching nodularity new from prior study consistent with small airway impactions. 8. Aortic and coronary artery atherosclerosis. 9. Cardiomegaly with chronic left atrial enlargement and central venous prominence. Tricuspid and mitral prosthesis. 10. Enlarged pulmonary trunk indicating arterial hypertension. 11. Small hiatal hernia. 12. Advanced sigmoid diverticulosis. 13. Osteopenia and degenerative change. 14. Emphysema. 15. Critical Value/emergent results were called by telephone at the time of interpretation on 11/17/2023 at 4:09 am to provider [facility] , who verbally acknowledged these results. Aortic Atherosclerosis (ICD10-I70.0) and Emphysema (ICD10-J43.9). Electronically Signed   By: Francis Quam M.D.   On: 11/17/2023 04:17   CT  PELVIS WO CONTRAST Result Date: 11/17/2023 CLINICAL DATA:  Hip trauma, fracture suspected, xray done EXAM: CT PELVIS WITHOUT CONTRAST TECHNIQUE: Multidetector CT imaging of the pelvis was performed following the standard protocol without intravenous contrast. RADIATION DOSE REDUCTION: This exam was performed according to the departmental dose-optimization program which includes automated exposure control, adjustment of the mA and/or kV according to patient size and/or use of iterative reconstruction technique. COMPARISON:  X-ray right hip 11/16/2023 FINDINGS: Urinary Tract:  Irregular right urinary bladder wall thick. Bowel: Colonic diverticulosis. Unremarkable visualized pelvic bowel loops. Vascular/Lymphatic: No pathologically enlarged lymph nodes. No significant vascular abnormality seen. Reproductive:  No mass or other significant abnormality Other: Large volume right space of Rezius high density free fluid. No intraperitoneal free gas. No organized fluid collection. Musculoskeletal: Acute comminuted and displaced right inferior pubic rami and superior pubic rami/pubic symphysis fracture. Acute nondisplaced right iliac fracture (9:22; 4:50). Asymmetric and irregular right internal and external obturator musculature. IMPRESSION: 1. Acute comminuted and displaced right inferior pubic rami and superior pubic rami/pubic symphysis fracture. 2. Acute nondisplaced right iliac fracture. 3. Large volume right extraperitoneal hematoma with likely associated extraperitoneal urinary bladder injury. Intraperitoneal urinary bladder injury not excluded. Recommend CT urogram. 4. Right internal and external obturator intramuscular hematoma. Electronically Signed   By: Morgane  Naveau M.D.   On: 11/17/2023 00:53   CT Thoracic Spine Wo Contrast Result Date: 11/17/2023 CLINICAL DATA:  Back trauma, no prior imaging (Age >= 16y) EXAM: CT THORACIC AND LUMBAR SPINE WITHOUT CONTRAST TECHNIQUE: Multidetector CT imaging of the  thoracic and lumbar spine was performed without contrast. Multiplanar CT image reconstructions were also generated. RADIATION DOSE REDUCTION: This exam was performed according to the departmental dose-optimization program which includes automated exposure control, adjustment of the mA and/or kV according to patient size and/or use of iterative reconstruction technique. COMPARISON:  MR lumbar spine 04/23/2018 FINDINGS: CT THORACIC SPINE FINDINGS Alignment: Grade 1 anterolisthesis of T1 on T2, T2 on T3, T3 on T4, T4 on T5.\. Vertebrae: No acute fracture or focal pathologic process. Paraspinal and other soft tissues: Negative. Disc levels: Multilevel intervertebral disc space vacuum phenomenon. CT LUMBAR SPINE FINDINGS Segmentation: 5 lumbar type vertebrae. Alignment: Stable grade 1 anterolisthesis of L3 on L4 and L4 on L5. Vertebrae: L3-L5 posterolateral surgical hardware. No acute fracture or focal pathologic process. Paraspinal and other soft tissues: Negative. Disc levels: Multilevel intervertebral disc space vacuum phenomenon. Other: Coarsened interlobular softball thickening. Peripheral reticulations. Atherosclerotic  plaque. Colonic diverticulosis. IMPRESSION: CT THORACIC SPINE IMPRESSION No acute displaced fracture or traumatic listhesis of the thoracic spine. CT LUMBAR SPINE IMPRESSION No acute displaced fracture or traumatic listhesis of the lumbar spine. Aortic Atherosclerosis (ICD10-I70.0). Electronically Signed   By: Morgane  Naveau M.D.   On: 11/17/2023 00:47   CT Lumbar Spine Wo Contrast Result Date: 11/17/2023 CLINICAL DATA:  Back trauma, no prior imaging (Age >= 16y) EXAM: CT THORACIC AND LUMBAR SPINE WITHOUT CONTRAST TECHNIQUE: Multidetector CT imaging of the thoracic and lumbar spine was performed without contrast. Multiplanar CT image reconstructions were also generated. RADIATION DOSE REDUCTION: This exam was performed according to the departmental dose-optimization program which includes  automated exposure control, adjustment of the mA and/or kV according to patient size and/or use of iterative reconstruction technique. COMPARISON:  MR lumbar spine 04/23/2018 FINDINGS: CT THORACIC SPINE FINDINGS Alignment: Grade 1 anterolisthesis of T1 on T2, T2 on T3, T3 on T4, T4 on T5.\. Vertebrae: No acute fracture or focal pathologic process. Paraspinal and other soft tissues: Negative. Disc levels: Multilevel intervertebral disc space vacuum phenomenon. CT LUMBAR SPINE FINDINGS Segmentation: 5 lumbar type vertebrae. Alignment: Stable grade 1 anterolisthesis of L3 on L4 and L4 on L5. Vertebrae: L3-L5 posterolateral surgical hardware. No acute fracture or focal pathologic process. Paraspinal and other soft tissues: Negative. Disc levels: Multilevel intervertebral disc space vacuum phenomenon. Other: Coarsened interlobular softball thickening. Peripheral reticulations. Atherosclerotic plaque. Colonic diverticulosis. IMPRESSION: CT THORACIC SPINE IMPRESSION No acute displaced fracture or traumatic listhesis of the thoracic spine. CT LUMBAR SPINE IMPRESSION No acute displaced fracture or traumatic listhesis of the lumbar spine. Aortic Atherosclerosis (ICD10-I70.0). Electronically Signed   By: Morgane  Naveau M.D.   On: 11/17/2023 00:47   CT Head Wo Contrast Result Date: 11/16/2023 CLINICAL DATA:  Head trauma, minor (Age >= 65y); Neck trauma (Age >= 65y). Fall EXAM: CT HEAD WITHOUT CONTRAST CT CERVICAL SPINE WITHOUT CONTRAST TECHNIQUE: Multidetector CT imaging of the head and cervical spine was performed following the standard protocol without intravenous contrast. Multiplanar CT image reconstructions of the cervical spine were also generated. RADIATION DOSE REDUCTION: This exam was performed according to the departmental dose-optimization program which includes automated exposure control, adjustment of the mA and/or kV according to patient size and/or use of iterative reconstruction technique. COMPARISON:  CT  head 12/06/2021, CT head and C-spine 10/22/2021 FINDINGS: CT HEAD FINDINGS Brain: Cerebral ventricle sizes are concordant with the degree of cerebral volume loss. Patchy and confluent areas of decreased attenuation are noted throughout the deep and periventricular white matter of the cerebral hemispheres bilaterally, compatible with chronic microvascular ischemic disease. No evidence of large-territorial acute infarction. No parenchymal hemorrhage. No mass lesion. No extra-axial collection. No mass effect or midline shift. No hydrocephalus. Basilar cisterns are patent. Vascular: No hyperdense vessel. Atherosclerotic calcifications are present within the cavernous internal carotid and vertebral arteries. Skull: Old healed right orbital floor fracture. No acute fracture or focal lesion. Sinuses/Orbits: Paranasal sinuses and mastoid air cells are clear. Bilateral lens replacement. Otherwise the orbits are unremarkable. Other: None. CT CERVICAL SPINE FINDINGS Alignment: Grade 1 anterolisthesis of C2 on C3, grade 1 anterolisthesis of C3 on C4. Retrolisthesis of C4 on C5 and C5 on C6. Skull base and vertebrae: Multilevel severe degenerative change of the spine with associated severe osseous neural foraminal stenosis of the right C4-C5 and C5-C6 level. No severe osseous central canal stenosis. No acute fracture. No aggressive appearing focal osseous lesion or focal pathologic process. Soft tissues and spinal canal: No prevertebral  fluid or swelling. No visible canal hematoma. Upper chest: Biapical pleural/pulmonary scarring with associated calcifications on the left. Chronic coarsened interstitial markings. Other: Atherosclerotic plaque of the carotid arteries within the neck. IMPRESSION: 1. No acute intracranial abnormality. 2. No acute displaced fracture or traumatic listhesis of the cervical spine. 3. Multilevel severe degenerative change of the spine with associated severe osseous neural foraminal stenosis of the right  C4-C5 and C5-C6 level. Electronically Signed   By: Morgane  Naveau M.D.   On: 11/16/2023 23:11   CT Cervical Spine Wo Contrast Result Date: 11/16/2023 CLINICAL DATA:  Head trauma, minor (Age >= 65y); Neck trauma (Age >= 65y). Fall EXAM: CT HEAD WITHOUT CONTRAST CT CERVICAL SPINE WITHOUT CONTRAST TECHNIQUE: Multidetector CT imaging of the head and cervical spine was performed following the standard protocol without intravenous contrast. Multiplanar CT image reconstructions of the cervical spine were also generated. RADIATION DOSE REDUCTION: This exam was performed according to the departmental dose-optimization program which includes automated exposure control, adjustment of the mA and/or kV according to patient size and/or use of iterative reconstruction technique. COMPARISON:  CT head 12/06/2021, CT head and C-spine 10/22/2021 FINDINGS: CT HEAD FINDINGS Brain: Cerebral ventricle sizes are concordant with the degree of cerebral volume loss. Patchy and confluent areas of decreased attenuation are noted throughout the deep and periventricular white matter of the cerebral hemispheres bilaterally, compatible with chronic microvascular ischemic disease. No evidence of large-territorial acute infarction. No parenchymal hemorrhage. No mass lesion. No extra-axial collection. No mass effect or midline shift. No hydrocephalus. Basilar cisterns are patent. Vascular: No hyperdense vessel. Atherosclerotic calcifications are present within the cavernous internal carotid and vertebral arteries. Skull: Old healed right orbital floor fracture. No acute fracture or focal lesion. Sinuses/Orbits: Paranasal sinuses and mastoid air cells are clear. Bilateral lens replacement. Otherwise the orbits are unremarkable. Other: None. CT CERVICAL SPINE FINDINGS Alignment: Grade 1 anterolisthesis of C2 on C3, grade 1 anterolisthesis of C3 on C4. Retrolisthesis of C4 on C5 and C5 on C6. Skull base and vertebrae: Multilevel severe degenerative  change of the spine with associated severe osseous neural foraminal stenosis of the right C4-C5 and C5-C6 level. No severe osseous central canal stenosis. No acute fracture. No aggressive appearing focal osseous lesion or focal pathologic process. Soft tissues and spinal canal: No prevertebral fluid or swelling. No visible canal hematoma. Upper chest: Biapical pleural/pulmonary scarring with associated calcifications on the left. Chronic coarsened interstitial markings. Other: Atherosclerotic plaque of the carotid arteries within the neck. IMPRESSION: 1. No acute intracranial abnormality. 2. No acute displaced fracture or traumatic listhesis of the cervical spine. 3. Multilevel severe degenerative change of the spine with associated severe osseous neural foraminal stenosis of the right C4-C5 and C5-C6 level. Electronically Signed   By: Morgane  Naveau M.D.   On: 11/16/2023 23:11   DG Hip Unilat With Pelvis 2-3 Views Right Result Date: 11/16/2023 CLINICAL DATA:  fall, pain EXAM: DG HIP (WITH OR WITHOUT PELVIS) 2-3V RIGHT COMPARISON:  CT abdomen pelvis 03/06/2022 FINDINGS: Cortical irregularity with likely acute displaced and comminuted right inferior pubic rami fracture. There is no evidence of hip fracture or dislocation of the right hip. No acute displaced fracture or dislocation of the left hip. No diastasis of the bones of the pelvis. Surgical hardware noted along the lower visualized lumbar spine. There is no evidence of arthropathy or other focal bone abnormality. Vascular calcifications. IMPRESSION: Cortical irregularity with likely acute displaced and comminuted right inferior pubic rami fracture. Electronically Signed   By:  Morgane  Naveau M.D.   On: 11/16/2023 23:05     .Critical Care  Performed by: Beverley Leita LABOR, PA-C Authorized by: Beverley Leita LABOR, PA-C   Critical care provider statement:    Critical care time (minutes):  75   Critical care was time spent personally by me on the following  activities:  Development of treatment plan with patient or surrogate, discussions with consultants, evaluation of patient's response to treatment, examination of patient, ordering and review of laboratory studies, ordering and review of radiographic studies, ordering and performing treatments and interventions, pulse oximetry, re-evaluation of patient's condition and review of old charts    Medications Ordered in the ED  fentaNYL  (SUBLIMAZE ) injection 50 mcg (0 mcg Intravenous Hold 11/17/23 0212)  coag fact Xa recombinant (ANDEXXA ) low dose infusion 900 mg (0 mg Intravenous Stopped 11/17/23 0530)  midazolam  (VERSED ) injection (1 mg Intravenous Given 11/17/23 0551)  fentaNYL  (SUBLIMAZE ) injection (25 mcg Intravenous Given 11/17/23 0606)  fentaNYL  (SUBLIMAZE ) injection 50 mcg (50 mcg Intravenous Given 11/17/23 0133)  iohexol  (OMNIPAQUE ) 350 MG/ML injection 100 mL (100 mLs Intravenous Contrast Given 11/17/23 0212)  ondansetron  (ZOFRAN ) injection 4 mg (4 mg Intravenous Given 11/17/23 0412)  iohexol  (OMNIPAQUE ) 300 MG/ML solution 150 mL (25 mLs Intravenous Contrast Given 11/17/23 0626)  iohexol  (OMNIPAQUE ) 300 MG/ML solution 100 mL (10 mLs Intra-arterial Contrast Given 11/17/23 9373)                                    Medical Decision Making Amount and/or Complexity of Data Reviewed Labs: ordered. Radiology: ordered.  Risk Prescription drug management. Decision regarding hospitalization.   This patient presents to the ED for concern of injuries from a fall, this involves an extensive number of treatment options, and is a complaint that carries with it a high risk of complications and morbidity.  The differential diagnosis includes but not limited to fracture, intracranial injury, contusions    Co morbidities / Chronic conditions that complicate the patient evaluation  A fib, on Eliquis ; RA, HTN, HLD, stage 3 colon cancer, TIA, GERD, CHF   Additional history obtained:  Additional history  obtained from EMR External records from outside source obtained and reviewed including prior labs on file   Lab Tests:  I Ordered, and personally interpreted labs.  The pertinent results include:  CBC with hgb 10.6. repeat H&H with drop to 9.5. INR 1.2; CMP without significant findings    Imaging Studies ordered:  I ordered imaging studies including CT head, c-spine, XR right hip; CT pelvis to further characterize the pelvic fractures; CT chest/abd/pelvis to further evaluate for trauma, CT urogram  I independently visualized and interpreted imaging which showed right pelvic rami fractures with hematoma; no acute intracranial findings.  I agree with the radiologist interpretation   Cardiac Monitoring: / EKG:  The patient was maintained on a cardiac monitor.  I personally viewed and interpreted the cardiac monitored which showed an underlying rhythm of: a fib, rate 105   Problem List / ED Course / Critical interventions / Medication management  89 year old female presents via EMS after fall from nursing facility.  Patient reported that she was going to change her pull-up and slipped on a flower pedals that were on the floor.  She is generally ambulatory with a walker however was not using her walker when this happened.  She is not sure if she lost consciousness, she is not sure if she  felt dizzy with her fall but she does not believe that she hit her head.  There is no obvious trauma to her head.  CT head and C-spine were negative for acute findings.  X-ray of the pelvis revealed right sided pubic rami fractures without hip fracture.  Patient went back to CT for CT of her T-spine, L-spine as she has now developed complaint of pain in her back as well as a CT of the pelvis.  The CT of the pelvis was concerning for a large hematoma.  Additional trauma scans were ordered to include CT chest abdomen pelvis as well as vascular study given her large hematoma.  Call from radiology with report for large  hematoma with active extravasation although unable to localize.  This was discussed with on-call trauma surgery who recommends reversing patient's Eliquis , consult to IR and Ortho. I ordered medication including fentanyl , zofran , andexxa    Reevaluation of the patient after these medicines showed that the patient stable I have reviewed the patients home medicines and have made adjustments as needed   Consultations Obtained:  I requested consultation with the trauma service, Dr. Paola,  and discussed lab and imaging findings as well as pertinent plan - they recommend: consult to ortho and IR, reverse patient's Eliquis ; Dr. Paola will come and see the patient. Case discussed with Dr. Johann with IR who has spoke with Dr. Paola for this patient and has called the team in to take care of her.  Case discussed with Dr. Elsa with orthopedics who will follow patient.  Case managed with ER attending Dr. Lenor on arrival, Dr. Griselda after change of shift.    Social Determinants of Health:  Lives at SNF   Test / Admission - Considered:  admit      Final diagnoses:  Fall, initial encounter  Multiple closed fractures of pelvis with disruption of pelvic ring, initial encounter [facility])  Female pelvic hematoma    ED Discharge Orders     None          Beverley Leita DELENA DEVONNA 11/17/23 [id number]    Griselda Norris, MD 11/17/23 9360    Griselda Norris, MD 12/14/23 2232

## 2023-11-16 NOTE — ED Triage Notes (Signed)
 Pt found down by facility. Pt has hip deformity and is on eliquis 

## 2023-11-16 NOTE — ED Notes (Signed)
 Blue top add on for protime

## 2023-11-17 ENCOUNTER — Emergency Department (HOSPITAL_COMMUNITY)

## 2023-11-17 ENCOUNTER — Encounter (HOSPITAL_COMMUNITY): Payer: Self-pay

## 2023-11-17 DIAGNOSIS — S3289XA Fracture of other parts of pelvis, initial encounter for closed fracture: Secondary | ICD-10-CM | POA: Diagnosis not present

## 2023-11-17 DIAGNOSIS — G2581 Restless legs syndrome: Secondary | ICD-10-CM | POA: Diagnosis present

## 2023-11-17 DIAGNOSIS — S329XXA Fracture of unspecified parts of lumbosacral spine and pelvis, initial encounter for closed fracture: Secondary | ICD-10-CM | POA: Diagnosis present

## 2023-11-17 DIAGNOSIS — S334XXA Traumatic rupture of symphysis pubis, initial encounter: Secondary | ICD-10-CM | POA: Diagnosis not present

## 2023-11-17 DIAGNOSIS — F29 Unspecified psychosis not due to a substance or known physiological condition: Secondary | ICD-10-CM | POA: Diagnosis not present

## 2023-11-17 DIAGNOSIS — S32511A Fracture of superior rim of right pubis, initial encounter for closed fracture: Secondary | ICD-10-CM | POA: Diagnosis present

## 2023-11-17 DIAGNOSIS — E871 Hypo-osmolality and hyponatremia: Secondary | ICD-10-CM | POA: Diagnosis present

## 2023-11-17 DIAGNOSIS — M19012 Primary osteoarthritis, left shoulder: Secondary | ICD-10-CM | POA: Diagnosis present

## 2023-11-17 DIAGNOSIS — I482 Chronic atrial fibrillation, unspecified: Secondary | ICD-10-CM | POA: Diagnosis present

## 2023-11-17 DIAGNOSIS — R4182 Altered mental status, unspecified: Secondary | ICD-10-CM | POA: Diagnosis not present

## 2023-11-17 DIAGNOSIS — F01A0 Vascular dementia, mild, without behavioral disturbance, psychotic disturbance, mood disturbance, and anxiety: Secondary | ICD-10-CM | POA: Diagnosis present

## 2023-11-17 DIAGNOSIS — E039 Hypothyroidism, unspecified: Secondary | ICD-10-CM | POA: Diagnosis present

## 2023-11-17 DIAGNOSIS — N9489 Other specified conditions associated with female genital organs and menstrual cycle: Secondary | ICD-10-CM | POA: Diagnosis present

## 2023-11-17 DIAGNOSIS — I480 Paroxysmal atrial fibrillation: Secondary | ICD-10-CM | POA: Diagnosis present

## 2023-11-17 DIAGNOSIS — E785 Hyperlipidemia, unspecified: Secondary | ICD-10-CM | POA: Diagnosis present

## 2023-11-17 DIAGNOSIS — S299XXA Unspecified injury of thorax, initial encounter: Secondary | ICD-10-CM | POA: Diagnosis not present

## 2023-11-17 DIAGNOSIS — K219 Gastro-esophageal reflux disease without esophagitis: Secondary | ICD-10-CM | POA: Diagnosis present

## 2023-11-17 DIAGNOSIS — Z79899 Other long term (current) drug therapy: Secondary | ICD-10-CM | POA: Diagnosis not present

## 2023-11-17 DIAGNOSIS — I10 Essential (primary) hypertension: Secondary | ICD-10-CM | POA: Diagnosis present

## 2023-11-17 DIAGNOSIS — Z8673 Personal history of transient ischemic attack (TIA), and cerebral infarction without residual deficits: Secondary | ICD-10-CM | POA: Diagnosis not present

## 2023-11-17 DIAGNOSIS — S32591A Other specified fracture of right pubis, initial encounter for closed fracture: Secondary | ICD-10-CM | POA: Diagnosis present

## 2023-11-17 DIAGNOSIS — Y92410 Unspecified street and highway as the place of occurrence of the external cause: Secondary | ICD-10-CM | POA: Diagnosis not present

## 2023-11-17 DIAGNOSIS — S32301A Unspecified fracture of right ilium, initial encounter for closed fracture: Secondary | ICD-10-CM | POA: Diagnosis not present

## 2023-11-17 DIAGNOSIS — N3281 Overactive bladder: Secondary | ICD-10-CM | POA: Diagnosis present

## 2023-11-17 DIAGNOSIS — D62 Acute posthemorrhagic anemia: Secondary | ICD-10-CM | POA: Diagnosis not present

## 2023-11-17 DIAGNOSIS — K5730 Diverticulosis of large intestine without perforation or abscess without bleeding: Secondary | ICD-10-CM | POA: Diagnosis not present

## 2023-11-17 DIAGNOSIS — Z8249 Family history of ischemic heart disease and other diseases of the circulatory system: Secondary | ICD-10-CM | POA: Diagnosis not present

## 2023-11-17 DIAGNOSIS — S35511A Injury of right iliac artery, initial encounter: Secondary | ICD-10-CM | POA: Diagnosis present

## 2023-11-17 DIAGNOSIS — R110 Nausea: Secondary | ICD-10-CM | POA: Diagnosis not present

## 2023-11-17 DIAGNOSIS — Z79890 Hormone replacement therapy (postmenopausal): Secondary | ICD-10-CM | POA: Diagnosis not present

## 2023-11-17 DIAGNOSIS — Z85828 Personal history of other malignant neoplasm of skin: Secondary | ICD-10-CM | POA: Diagnosis not present

## 2023-11-17 DIAGNOSIS — Z85038 Personal history of other malignant neoplasm of large intestine: Secondary | ICD-10-CM | POA: Diagnosis not present

## 2023-11-17 DIAGNOSIS — Z7401 Bed confinement status: Secondary | ICD-10-CM | POA: Diagnosis not present

## 2023-11-17 DIAGNOSIS — S3992XA Unspecified injury of lower back, initial encounter: Secondary | ICD-10-CM | POA: Diagnosis not present

## 2023-11-17 DIAGNOSIS — Z7901 Long term (current) use of anticoagulants: Secondary | ICD-10-CM | POA: Diagnosis not present

## 2023-11-17 DIAGNOSIS — M069 Rheumatoid arthritis, unspecified: Secondary | ICD-10-CM | POA: Diagnosis present

## 2023-11-17 DIAGNOSIS — F419 Anxiety disorder, unspecified: Secondary | ICD-10-CM | POA: Diagnosis present

## 2023-11-17 DIAGNOSIS — I700 Atherosclerosis of aorta: Secondary | ICD-10-CM | POA: Diagnosis not present

## 2023-11-17 HISTORY — PX: IR US GUIDE VASC ACCESS RIGHT: IMG2390

## 2023-11-17 HISTORY — PX: IR EMBO ART  VEN HEMORR LYMPH EXTRAV  INC GUIDE ROADMAPPING: IMG5450

## 2023-11-17 HISTORY — PX: IR ANGIOGRAM SELECTIVE EACH ADDITIONAL VESSEL: IMG667

## 2023-11-17 HISTORY — PX: IR ANGIOGRAM PELVIS SELECTIVE OR SUPRASELECTIVE: IMG661

## 2023-11-17 LAB — MRSA NEXT GEN BY PCR, NASAL: MRSA by PCR Next Gen: NOT DETECTED

## 2023-11-17 LAB — BASIC METABOLIC PANEL WITH GFR
Anion gap: 10 (ref 5–15)
BUN: 23 mg/dL (ref 8–23)
CO2: 20 mmol/L — ABNORMAL LOW (ref 22–32)
Calcium: 8.7 mg/dL — ABNORMAL LOW (ref 8.9–10.3)
Chloride: 106 mmol/L (ref 98–111)
Creatinine, Ser: 0.81 mg/dL (ref 0.44–1.00)
GFR, Estimated: >60 mL/min (ref >60)
Glucose, Bld: 141 mg/dL — ABNORMAL HIGH (ref 70–99)
Potassium: 4.1 mmol/L (ref 3.5–5.1)
Sodium: 136 mmol/L (ref 135–145)

## 2023-11-17 LAB — URINALYSIS, ROUTINE W REFLEX MICROSCOPIC
Bilirubin Urine: NEGATIVE
Glucose, UA: NEGATIVE mg/dL
Hgb urine dipstick: NEGATIVE
Ketones, ur: NEGATIVE mg/dL
Leukocytes,Ua: NEGATIVE
Nitrite: NEGATIVE
Protein, ur: 30 mg/dL — AB
Specific Gravity, Urine: >1.046 — ABNORMAL HIGH (ref 1.005–1.030)
pH: 5 (ref 5.0–8.0)

## 2023-11-17 LAB — CBC
HCT: 28.5 % — ABNORMAL LOW (ref 36.0–46.0)
Hemoglobin: 9.4 g/dL — ABNORMAL LOW (ref 12.0–15.0)
MCH: 34.7 pg — ABNORMAL HIGH (ref 26.0–34.0)
MCHC: 33 g/dL (ref 30.0–36.0)
MCV: 105.2 fL — ABNORMAL HIGH (ref 80.0–100.0)
Platelets: 173 K/uL (ref 150–400)
RBC: 2.71 MIL/uL — ABNORMAL LOW (ref 3.87–5.11)
RDW: 16.3 % — ABNORMAL HIGH (ref 11.5–15.5)
WBC: 13.1 K/uL — ABNORMAL HIGH (ref 4.0–10.5)
nRBC: 0 % (ref 0.0–0.2)

## 2023-11-17 LAB — TYPE AND SCREEN
ABO/RH(D): O NEG
Antibody Screen: NEGATIVE

## 2023-11-17 LAB — HEMOGLOBIN AND HEMATOCRIT, BLOOD
HCT: 30.2 % — ABNORMAL LOW (ref 36.0–46.0)
Hemoglobin: 9.5 g/dL — ABNORMAL LOW (ref 12.0–15.0)

## 2023-11-17 MED ORDER — EMPTY CONTAINERS FLEXIBLE MISC
900.0000 mg | Freq: Once | Status: DC
  Filled 2023-11-17: qty 90

## 2023-11-17 MED ORDER — FENTANYL CITRATE (PF) 100 MCG/2ML IJ SOLN
INTRAMUSCULAR | Status: AC | PRN
  Administered 2023-11-17 (×2): 25 ug via INTRAVENOUS

## 2023-11-17 MED ORDER — METOPROLOL TARTRATE 5 MG/5ML IV SOLN: 5.0000 mg | Freq: Four times a day (QID) | INTRAVENOUS | Status: DC | PRN

## 2023-11-17 MED ORDER — FENTANYL CITRATE PF 50 MCG/ML IJ SOSY
50.0000 ug | PREFILLED_SYRINGE | Freq: Once | INTRAMUSCULAR | Status: DC
  Filled 2023-11-17: qty 1

## 2023-11-17 MED ORDER — FENTANYL CITRATE PF 50 MCG/ML IJ SOSY
50.0000 ug | PREFILLED_SYRINGE | Freq: Once | INTRAMUSCULAR | Status: AC
  Administered 2023-11-17: 50 ug via INTRAVENOUS

## 2023-11-17 MED ORDER — IOHEXOL 300 MG/ML  SOLN
150.0000 mL | Freq: Once | INTRAMUSCULAR | Status: AC | PRN
  Administered 2023-11-17: 25 mL via INTRAVENOUS

## 2023-11-17 MED ORDER — DOCUSATE SODIUM 100 MG PO CAPS
100.0000 mg | ORAL_CAPSULE | Freq: Two times a day (BID) | ORAL | Status: DC
  Administered 2023-11-17 – 2023-11-20 (×4): 100 mg via ORAL
  Filled 2023-11-17 (×5): qty 1

## 2023-11-17 MED ORDER — METHOCARBAMOL 500 MG PO TABS
500.0000 mg | ORAL_TABLET | Freq: Three times a day (TID) | ORAL | Status: DC
  Administered 2023-11-17 – 2023-11-20 (×8): 500 mg via ORAL
  Filled 2023-11-17 (×9): qty 1

## 2023-11-17 MED ORDER — ENOXAPARIN SODIUM 30 MG/0.3ML IJ SOSY: 30.0000 mg | PREFILLED_SYRINGE | Freq: Two times a day (BID) | INTRAMUSCULAR | Status: DC

## 2023-11-17 MED ORDER — POLYETHYLENE GLYCOL 3350 17 G PO PACK: 17.0000 g | PACK | Freq: Every day | ORAL | Status: DC | PRN

## 2023-11-17 MED ORDER — ORAL CARE MOUTH RINSE: 15.0000 mL | OROMUCOSAL | Status: DC | PRN

## 2023-11-17 MED ORDER — HYDRALAZINE HCL 20 MG/ML IJ SOLN: 10.0000 mg | INTRAMUSCULAR | Status: DC | PRN

## 2023-11-17 MED ORDER — IOHEXOL 300 MG/ML  SOLN
100.0000 mL | Freq: Once | INTRAMUSCULAR | Status: AC | PRN
  Administered 2023-11-17: 10 mL via INTRA_ARTERIAL

## 2023-11-17 MED ORDER — ONDANSETRON HCL 4 MG/2ML IJ SOLN
4.0000 mg | Freq: Four times a day (QID) | INTRAMUSCULAR | Status: DC | PRN
  Administered 2023-11-17 – 2023-11-19 (×3): 4 mg via INTRAVENOUS
  Filled 2023-11-17 (×3): qty 2

## 2023-11-17 MED ORDER — MORPHINE SULFATE (PF) 2 MG/ML IV SOLN
1.0000 mg | Freq: Four times a day (QID) | INTRAVENOUS | Status: DC | PRN
  Administered 2023-11-17: 2 mg via INTRAVENOUS
  Filled 2023-11-17: qty 1

## 2023-11-17 MED ORDER — HYDROCODONE-ACETAMINOPHEN 5-325 MG PO TABS
1.0000 | ORAL_TABLET | ORAL | Status: DC | PRN
  Administered 2023-11-17: 1 via ORAL
  Administered 2023-11-18: 2 via ORAL
  Filled 2023-11-17: qty 1
  Filled 2023-11-17: qty 2

## 2023-11-17 MED ORDER — IRBESARTAN 150 MG PO TABS
75.0000 mg | ORAL_TABLET | Freq: Every day | ORAL | Status: DC
  Administered 2023-11-17 – 2023-11-20 (×4): 75 mg via ORAL
  Filled 2023-11-17 (×4): qty 1

## 2023-11-17 MED ORDER — IOHEXOL 350 MG/ML SOLN
100.0000 mL | Freq: Once | INTRAVENOUS | Status: AC | PRN
  Administered 2023-11-17: 100 mL via INTRAVENOUS

## 2023-11-17 MED ORDER — MIDAZOLAM HCL 2 MG/2ML IJ SOLN
INTRAMUSCULAR | Status: AC
  Filled 2023-11-17: qty 2

## 2023-11-17 MED ORDER — OXYCODONE HCL 5 MG PO TABS
2.5000 mg | ORAL_TABLET | ORAL | Status: DC | PRN
  Administered 2023-11-17: 5 mg via ORAL
  Filled 2023-11-17: qty 1

## 2023-11-17 MED ORDER — FENTANYL CITRATE (PF) 100 MCG/2ML IJ SOLN
INTRAMUSCULAR | Status: AC
  Filled 2023-11-17: qty 2

## 2023-11-17 MED ORDER — ONDANSETRON 4 MG PO TBDP
4.0000 mg | ORAL_TABLET | Freq: Four times a day (QID) | ORAL | Status: DC | PRN
Start: 2023-11-17 — End: 2023-11-20

## 2023-11-17 MED ORDER — CHLORHEXIDINE GLUCONATE CLOTH 2 % EX PADS
6.0000 | MEDICATED_PAD | Freq: Every day | CUTANEOUS | Status: DC
  Administered 2023-11-17 – 2023-11-19 (×2): 6 via TOPICAL

## 2023-11-17 MED ORDER — MIDAZOLAM HCL 2 MG/2ML IJ SOLN
INTRAMUSCULAR | Status: AC | PRN
Start: 2023-11-17 — End: 2023-11-17
  Administered 2023-11-17: 1 mg via INTRAVENOUS

## 2023-11-17 MED ORDER — LIDOCAINE HCL 1 % IJ SOLN
INTRAMUSCULAR | Status: AC
  Filled 2023-11-17: qty 20

## 2023-11-17 MED ORDER — METOPROLOL TARTRATE 50 MG PO TABS
75.0000 mg | ORAL_TABLET | Freq: Two times a day (BID) | ORAL | Status: DC
  Administered 2023-11-17 – 2023-11-20 (×7): 75 mg via ORAL
  Filled 2023-11-17 (×3): qty 1
  Filled 2023-11-17 (×4): qty 2

## 2023-11-17 MED ORDER — ACETAMINOPHEN 500 MG PO TABS
1000.0000 mg | ORAL_TABLET | Freq: Four times a day (QID) | ORAL | Status: DC
  Administered 2023-11-17 – 2023-11-20 (×9): 1000 mg via ORAL
  Filled 2023-11-17 (×10): qty 2

## 2023-11-17 MED ORDER — GELATIN ABSORBABLE 12-7 MM EX MISC
CUTANEOUS | Status: AC
  Filled 2023-11-17: qty 1

## 2023-11-17 MED ORDER — LACTATED RINGERS IV SOLN: INTRAVENOUS | Status: AC

## 2023-11-17 MED ORDER — ONDANSETRON HCL 4 MG/2ML IJ SOLN
4.0000 mg | Freq: Once | INTRAMUSCULAR | Status: AC
  Administered 2023-11-17: 4 mg via INTRAVENOUS
  Filled 2023-11-17: qty 2

## 2023-11-17 NOTE — H&P (Signed)
 Reason for Consult/Chief Complaint: pelvic fracture with hematoma and active extravasation Consultant: Beverley, PA  Kathy Velez is an 89 y.o. female.   HPI: 57F who is a nonactivated trauma for unwitnessed fall while at SNF, report of downtime. On Eliquis  for AF. Foound to have pelvic fx on CT with pelvic hematoma and active extrav. Normotensive and reversal ordered by EDP. Patient unable to participate in history as she is immediately post-op from AE and has received sedation for the procedure.   Past Medical History:  Diagnosis Date   Allergy    seasonal   Anemia    Anxiety    Arthritis    Back   Asymptomatic menopausal state    Atrial fibrillation (HCC)    Cancer (HCC) 2006   Colon.  Basal Cell Skin cancer- right arm   Cataract    removed bilateral   Chronic atrial fibrillation (HCC)    Colon cancer (HCC)    Constipation due to pain medication therapy    after heart surgery   Deficiency of other specified B group vitamins    Diplopia    Disorientation, unspecified    Dysrhythmia    PAF   GERD (gastroesophageal reflux disease)    Heart murmur    Hyperlipidemia    Hypertension    Hypothyroidism    Malignant neoplasm of colon, unspecified (HCC)    Other specified diseases of blood and blood-forming organs    Other specified disorders of bone density and structure, unspecified forearm    Other thrombophilia (HCC)    per matrix   Overactive bladder    per Matrix   Personal history of other malignant neoplasm of large intestine    Presbycusis, bilateral    per matrix   RA (rheumatoid arthritis) (HCC)    Restless leg    Seizures (HCC)    after Heart Surgey   Stroke (HCC)    TIA- found by neurologist after    Valgus deformity, not elsewhere classified, right knee    per matrix   Vascular dementia, mild, without behavioral disturbance, psychotic disturbance, mood disturbance, and anxiety (HCC)    per matrix    Past Surgical History:  Procedure Laterality  Date   ABDOMINAL HYSTERECTOMY  1970   Partial    COLON RESECTION  2006   cancer   COLON SURGERY     COLONOSCOPY     EYE SURGERY Bilateral    Cataract   MAXIMUM ACCESS (MAS)POSTERIOR LUMBAR INTERBODY FUSION (PLIF) 2 LEVEL N/A 04/12/2015   Procedure: Lumbar Three-Five Decompression, Pedicle Screw Fixation, and Posteriolateral Arthrodesis;  Surgeon: Fairy Levels, MD;  Location: [facility];  Service: Neurosurgery;  Laterality: N/A;  L3-4 L4-5 Maximum access posterior lumbar fusion, possible interbodies and resection of synovial cyst at L4-5   MITRAL VALVE REPAIR  01/20/2013   Gore-tex cords to P1, P2, and P3. Magic suture to posterior medial commisure, #30 Physio 1 ring. Done in [place]    TONSILLECTOMY     about 1940   TRICUSPID VALVE SURGERY  01/20/2013   #28 TriAd ring done in [place]     Family History  Problem Relation Age of Onset   Lung disease Mother 29   Heart disease Father 46   Colon polyps Daughter    Alcohol abuse Son    Colon cancer Neg Hx    Stomach cancer Neg Hx    Rectal cancer Neg Hx    Crohn's disease Neg Hx    Esophageal cancer Neg Hx  Social History:  reports that she has never smoked. She has been exposed to tobacco smoke. She has never used smokeless tobacco. She reports current alcohol use of about 1.0 standard drink of alcohol per week. She reports that she does not use drugs.  Allergies:  Allergies  Allergen Reactions   Claritin [Loratadine] Other (See Comments)    Listed on MAR Unknown reaction   Codeine Other (See Comments)    just don't take it well   Gabapentin  Other (See Comments)    Dizziness    Molds & Smuts Other (See Comments)    Dust.  Reaction is not listed on MAR    Pollen Extract Swelling    Grass   Bee Venom Swelling and Rash   Wasp Venom Swelling and Rash    Medications: I have reviewed the patient's current medications.  Results for orders placed or performed during the hospital encounter of 11/16/23 (from the past 48  hours)  CBC with Differential     Status: Abnormal   Collection Time: 11/16/23 10:10 PM  Result Value Ref Range   WBC 10.5 4.0 - 10.5 K/uL   RBC 3.04 (L) 3.87 - 5.11 MIL/uL   Hemoglobin 10.6 (L) 12.0 - 15.0 g/dL   HCT 67.8 (L) 63.9 - 53.9 %   MCV 105.6 (H) 80.0 - 100.0 fL   MCH 34.9 (H) 26.0 - 34.0 pg   MCHC 33.0 30.0 - 36.0 g/dL   RDW 83.7 (H) 88.4 - 84.4 %   Platelets 196 150 - 400 K/uL   nRBC 0.0 0.0 - 0.2 %   Neutrophils Relative % 59 %   Neutro Abs 6.3 1.7 - 7.7 K/uL   Lymphocytes Relative 24 %   Lymphs Abs 2.5 0.7 - 4.0 K/uL   Monocytes Relative 13 %   Monocytes Absolute 1.4 (H) 0.1 - 1.0 K/uL   Eosinophils Relative 2 %   Eosinophils Absolute 0.2 0.0 - 0.5 K/uL   Basophils Relative 1 %   Basophils Absolute 0.1 0.0 - 0.1 K/uL   Immature Granulocytes 1 %   Abs Immature Granulocytes 0.10 (H) 0.00 - 0.07 K/uL    Comment: Performed at [facility], [address], [place], [place] [address]  Comprehensive metabolic panel     Status: Abnormal   Collection Time: 11/16/23 10:10 PM  Result Value Ref Range   Sodium 136 135 - 145 mmol/L   Potassium 4.1 3.5 - 5.1 mmol/L   Chloride 102 98 - 111 mmol/L   CO2 22 22 - 32 mmol/L   Glucose, Bld 121 (H) 70 - 99 mg/dL    Comment: Glucose reference range applies only to samples taken after fasting for at least 8 hours.   BUN 28 (H) 8 - 23 mg/dL   Creatinine, Ser 8.95 (H) 0.44 - 1.00 mg/dL   Calcium  9.1 8.9 - 10.3 mg/dL   Total Protein 6.6 6.5 - 8.1 g/dL   Albumin 3.6 3.5 - 5.0 g/dL   AST 44 (H) 15 - 41 U/L   ALT 42 0 - 44 U/L   Alkaline Phosphatase 104 38 - 126 U/L   Total Bilirubin 0.8 0.0 - 1.2 mg/dL   GFR, Estimated 51 (L) >60 mL/min    Comment: (NOTE) Calculated using the CKD-EPI Creatinine Equation (2021)    Anion gap 12 5 - 15    Comment: Performed at [facility], [address], [place], [place] [address]  Protime-INR     Status: Abnormal  Collection Time: 11/16/23 10:10 PM  Result Value Ref Range    Prothrombin  Time 15.4 (H) 11.4 - 15.2 seconds   INR 1.2 0.8 - 1.2    Comment: (NOTE) INR goal varies based on device and disease states. Performed at [facility], [address], [place], [place] [address]   Type and screen [facility]     Status: None   Collection Time: 11/17/23  3:40 AM  Result Value Ref Range   ABO/RH(D) O NEG    Antibody Screen NEG    Sample Expiration      11/20/2023,2359 Performed at [facility], [address], [place], [place] [address]   Hemoglobin and hematocrit, blood     Status: Abnormal   Collection Time: 11/17/23  3:50 AM  Result Value Ref Range   Hemoglobin 9.5 (L) 12.0 - 15.0 g/dL   HCT 69.7 (L) 63.9 - 53.9 %    Comment: Performed at [facility], [address], [place], [place] [address]    CT CHEST W CONTRAST Result Date: 11/17/2023 CLINICAL DATA:  Fall, with suspected acute comminuted right inferior pubic ramus fracture, concern is for urinary injury. Blunt polytrauma with potential for acute chest trauma as well. EXAM: CT CHEST WITH CONTRAST CT ABDOMEN AND PELVIS WITH AND WITHOUT CONTRAST TECHNIQUE: Multidetector CT imaging of the chest was performed during intravenous contrast administration. Multidetector CT imaging of the abdomen and pelvis was performed prior to and following the standard protocol before and during bolus administration of intravenous contrast. RADIATION DOSE REDUCTION: This exam was performed according to the departmental dose-optimization program which includes automated exposure control, adjustment of the mA and/or kV according to patient size and/or use of iterative reconstruction technique. CONTRAST:  OMNIPAQUE  IOHEXOL  350 MG/ML SOLN COMPARISON:  Most recent chest x-ray was PA Lat chest 09/23/2023, before that chest radiograph 08/06/2022. Comparison is made with the chest, abdomen and pelvis CT with contrast 03/31/2021, abdomen and pelvis CT with IV contrast 03/06/2022. FINDINGS: CT  CHEST FINDINGS Cardiovascular: The heart is slightly enlarged. Again noted are sternotomy sutures with prosthetic tricuspid and mitral valves. There is chronic left atrial enlargement and central venous prominence. Patchy three-vessel coronary artery calcification. Enlarged pulmonary trunk again measures 3.6 cm indicating arterial hypertension. There is no central embolus on this nontailored exam. There is atherosclerosis in the aorta and great vessels without aneurysm, stenosis or dissection. No pericardial effusion. Mediastinum/Nodes: Small small hiatal hernia. Unremarkable esophagus. Thyroid  gland is not seen and could be removed, ablated or extremely atrophic. There is no intrathoracic or axillary adenopathy. The trachea is ectatic but clear. There is tracheobronchial chondrocalcinosis. Lungs/Pleura: There is biapical symmetric pleuroparenchymal scarring, calcifications and bronchiolectasis. There is mild centrilobular emphysema in the upper lobes, and subpleural reticulation in the lung fields with an upper lobe predominance consistent with chronic interstitial lung disease. There is increased consolidation posteriorly in the right middle lobe which is most likely either due to pneumonia or aspiration. There is increased posterior atelectasis in the basilar lower lobes. Interval new 6 mm subpleural left upper lobe nodule on 5:55. There is a new pleural-based 5 mm left lower lobe nodule on 5:117. There is new branching nodularity in the posterolateral left base consistent with small airway impactions. Similar new branching nodularity right lower lobe on 5:112, anterior left upper lobe on 5:48 and in the lingula on 5:75. There is no pleural effusion.  No pneumothorax. Musculoskeletal: Osteopenia, mild kyphosis and degenerative change thoracic spine.  No acute or significant osseous findings. The CT ABDOMEN AND PELVIS FINDINGS Hepatobiliary: No focal liver abnormality is seen. No gallstones, gallbladder wall  thickening, or biliary dilatation. Pancreas: Unremarkable. No pancreatic ductal dilatation or surrounding inflammatory changes. Spleen: Normal in size without focal abnormality. Adrenals/Urinary Tract: No adrenal mass is seen. There is no renal mass enhancement. Stable 1 cm Bosniak 1 cyst anterior right kidney. Occasional subcentimeter Bosniak 2 cortical cysts of both kidneys are too small to characterize. There is cortical thinning in both kidneys. No urinary stone or obstruction. The bladder wall is unremarkable, but the bladder is displaced into the left hemipelvis and mildly compressed by a sizable right perivesical extraperitoneal hematoma. No contrast extravasation outside of the bladder is seen. Stomach/Bowel: Prior partial right hemicolectomy with primary ileocolic anastomosis. No dilatation or overt wall thickening. Advanced sigmoid diverticulosis without evidence of diverticulitis. Vascular/Lymphatic: Large hematoma in the right hemipelvis along side the bladder, measuring 9.4 x 6.7 x 9.4 cm with active bleeding into the hematoma and scattered contrast levels on the delayed exam. On the initial arterial phase images there is curvilinear contrast within the collection, but it could not be traced back to a specific vessel. Additional free hemoperitoneum collects above the bladder dome to the left and right. Reproductive: Status post hysterectomy. No adnexal masses. Other: Free hemorrhage above the bladder and hematoma to the right of the bladder as above. No free air. Musculoskeletal: Comminuted fracture, not significantly displaced involving medial aspect of the right superior pubic ramus and the mid to medial aspect of the right inferior pubic ramus, with swelling in the adjacent right obturator internus and externus. There is osteopenia, degenerative and postsurgical change of the spine. No other regional skeletal fracture is evident. IMPRESSION: 1. Hematoma in the right hemipelvis along side the bladder,  measuring 9.4 x 6.7 x 9.4 cm, with active bleeding into the hematoma and scattered contrast levels on the delayed exam. The active bleeding could not be traced back to a specific vessel. 2. Comminuted fractures of the right superior and inferior pubic rami. 3. No urinary bladder extravasation is seen. 4. No other acute trauma related findings in the chest, abdomen or pelvis. 5. Chronic interstitial lung disease with increased consolidation in the posterior right middle lobe which could be due to pneumonia or aspiration. 6. New 6 mm left upper lobe and 5 mm left lower lobe nodules. Follow-up as indicated taking into account age and life expectancy. 7. Scattered peripheral branching nodularity new from prior study consistent with small airway impactions. 8. Aortic and coronary artery atherosclerosis. 9. Cardiomegaly with chronic left atrial enlargement and central venous prominence. Tricuspid and mitral prosthesis. 10. Enlarged pulmonary trunk indicating arterial hypertension. 11. Small hiatal hernia. 12. Advanced sigmoid diverticulosis. 13. Osteopenia and degenerative change. 14. Emphysema. 15. Critical Value/emergent results were called by telephone at the time of interpretation on 11/17/2023 at 4:09 am to provider [facility] , who verbally acknowledged these results. Aortic Atherosclerosis (ICD10-I70.0) and Emphysema (ICD10-J43.9). Electronically Signed   By: Francis Quam M.D.   On: 11/17/2023 04:17   CT ABDOMEN PELVIS W WO CONTRAST Result Date: 11/17/2023 CLINICAL DATA:  Fall, with suspected acute comminuted right inferior pubic ramus fracture, concern is for urinary injury. Blunt polytrauma with potential for acute chest trauma as well. EXAM: CT CHEST WITH CONTRAST CT ABDOMEN AND PELVIS WITH AND WITHOUT CONTRAST TECHNIQUE: Multidetector CT imaging of the chest was performed during intravenous contrast administration. Multidetector CT imaging of the abdomen and pelvis was performed prior  to and following the  standard protocol before and during bolus administration of intravenous contrast. RADIATION DOSE REDUCTION: This exam was performed according to the departmental dose-optimization program which includes automated exposure control, adjustment of the mA and/or kV according to patient size and/or use of iterative reconstruction technique. CONTRAST:  OMNIPAQUE  IOHEXOL  350 MG/ML SOLN COMPARISON:  Most recent chest x-ray was PA Lat chest 09/23/2023, before that chest radiograph 08/06/2022. Comparison is made with the chest, abdomen and pelvis CT with contrast 03/31/2021, abdomen and pelvis CT with IV contrast 03/06/2022. FINDINGS: CT CHEST FINDINGS Cardiovascular: The heart is slightly enlarged. Again noted are sternotomy sutures with prosthetic tricuspid and mitral valves. There is chronic left atrial enlargement and central venous prominence. Patchy three-vessel coronary artery calcification. Enlarged pulmonary trunk again measures 3.6 cm indicating arterial hypertension. There is no central embolus on this nontailored exam. There is atherosclerosis in the aorta and great vessels without aneurysm, stenosis or dissection. No pericardial effusion. Mediastinum/Nodes: Small small hiatal hernia. Unremarkable esophagus. Thyroid  gland is not seen and could be removed, ablated or extremely atrophic. There is no intrathoracic or axillary adenopathy. The trachea is ectatic but clear. There is tracheobronchial chondrocalcinosis. Lungs/Pleura: There is biapical symmetric pleuroparenchymal scarring, calcifications and bronchiolectasis. There is mild centrilobular emphysema in the upper lobes, and subpleural reticulation in the lung fields with an upper lobe predominance consistent with chronic interstitial lung disease. There is increased consolidation posteriorly in the right middle lobe which is most likely either due to pneumonia or aspiration. There is increased posterior atelectasis in the basilar lower lobes. Interval  new 6 mm subpleural left upper lobe nodule on 5:55. There is a new pleural-based 5 mm left lower lobe nodule on 5:117. There is new branching nodularity in the posterolateral left base consistent with small airway impactions. Similar new branching nodularity right lower lobe on 5:112, anterior left upper lobe on 5:48 and in the lingula on 5:75. There is no pleural effusion.  No pneumothorax. Musculoskeletal: Osteopenia, mild kyphosis and degenerative change thoracic spine. No acute or significant osseous findings. The CT ABDOMEN AND PELVIS FINDINGS Hepatobiliary: No focal liver abnormality is seen. No gallstones, gallbladder wall thickening, or biliary dilatation. Pancreas: Unremarkable. No pancreatic ductal dilatation or surrounding inflammatory changes. Spleen: Normal in size without focal abnormality. Adrenals/Urinary Tract: No adrenal mass is seen. There is no renal mass enhancement. Stable 1 cm Bosniak 1 cyst anterior right kidney. Occasional subcentimeter Bosniak 2 cortical cysts of both kidneys are too small to characterize. There is cortical thinning in both kidneys. No urinary stone or obstruction. The bladder wall is unremarkable, but the bladder is displaced into the left hemipelvis and mildly compressed by a sizable right perivesical extraperitoneal hematoma. No contrast extravasation outside of the bladder is seen. Stomach/Bowel: Prior partial right hemicolectomy with primary ileocolic anastomosis. No dilatation or overt wall thickening. Advanced sigmoid diverticulosis without evidence of diverticulitis. Vascular/Lymphatic: Large hematoma in the right hemipelvis along side the bladder, measuring 9.4 x 6.7 x 9.4 cm with active bleeding into the hematoma and scattered contrast levels on the delayed exam. On the initial arterial phase images there is curvilinear contrast within the collection, but it could not be traced back to a specific vessel. Additional free hemoperitoneum collects above the bladder  dome to the left and right. Reproductive: Status post hysterectomy. No adnexal masses. Other: Free hemorrhage above the bladder and hematoma to the right of the bladder as above. No free air. Musculoskeletal: Comminuted fracture, not significantly displaced involving medial aspect  of the right superior pubic ramus and the mid to medial aspect of the right inferior pubic ramus, with swelling in the adjacent right obturator internus and externus. There is osteopenia, degenerative and postsurgical change of the spine. No other regional skeletal fracture is evident. IMPRESSION: 1. Hematoma in the right hemipelvis along side the bladder, measuring 9.4 x 6.7 x 9.4 cm, with active bleeding into the hematoma and scattered contrast levels on the delayed exam. The active bleeding could not be traced back to a specific vessel. 2. Comminuted fractures of the right superior and inferior pubic rami. 3. No urinary bladder extravasation is seen. 4. No other acute trauma related findings in the chest, abdomen or pelvis. 5. Chronic interstitial lung disease with increased consolidation in the posterior right middle lobe which could be due to pneumonia or aspiration. 6. New 6 mm left upper lobe and 5 mm left lower lobe nodules. Follow-up as indicated taking into account age and life expectancy. 7. Scattered peripheral branching nodularity new from prior study consistent with small airway impactions. 8. Aortic and coronary artery atherosclerosis. 9. Cardiomegaly with chronic left atrial enlargement and central venous prominence. Tricuspid and mitral prosthesis. 10. Enlarged pulmonary trunk indicating arterial hypertension. 11. Small hiatal hernia. 12. Advanced sigmoid diverticulosis. 13. Osteopenia and degenerative change. 14. Emphysema. 15. Critical Value/emergent results were called by telephone at the time of interpretation on 11/17/2023 at 4:09 am to provider [facility] , who verbally acknowledged these results. Aortic  Atherosclerosis (ICD10-I70.0) and Emphysema (ICD10-J43.9). Electronically Signed   By: Francis Quam M.D.   On: 11/17/2023 04:17   CT PELVIS WO CONTRAST Result Date: 11/17/2023 CLINICAL DATA:  Hip trauma, fracture suspected, xray done EXAM: CT PELVIS WITHOUT CONTRAST TECHNIQUE: Multidetector CT imaging of the pelvis was performed following the standard protocol without intravenous contrast. RADIATION DOSE REDUCTION: This exam was performed according to the departmental dose-optimization program which includes automated exposure control, adjustment of the mA and/or kV according to patient size and/or use of iterative reconstruction technique. COMPARISON:  X-ray right hip 11/16/2023 FINDINGS: Urinary Tract:  Irregular right urinary bladder wall thick. Bowel: Colonic diverticulosis. Unremarkable visualized pelvic bowel loops. Vascular/Lymphatic: No pathologically enlarged lymph nodes. No significant vascular abnormality seen. Reproductive:  No mass or other significant abnormality Other: Large volume right space of Rezius high density free fluid. No intraperitoneal free gas. No organized fluid collection. Musculoskeletal: Acute comminuted and displaced right inferior pubic rami and superior pubic rami/pubic symphysis fracture. Acute nondisplaced right iliac fracture (9:22; 4:50). Asymmetric and irregular right internal and external obturator musculature. IMPRESSION: 1. Acute comminuted and displaced right inferior pubic rami and superior pubic rami/pubic symphysis fracture. 2. Acute nondisplaced right iliac fracture. 3. Large volume right extraperitoneal hematoma with likely associated extraperitoneal urinary bladder injury. Intraperitoneal urinary bladder injury not excluded. Recommend CT urogram. 4. Right internal and external obturator intramuscular hematoma. Electronically Signed   By: Morgane  Naveau M.D.   On: 11/17/2023 00:53   CT Thoracic Spine Wo Contrast Result Date: 11/17/2023 CLINICAL DATA:  Back  trauma, no prior imaging (Age >= 16y) EXAM: CT THORACIC AND LUMBAR SPINE WITHOUT CONTRAST TECHNIQUE: Multidetector CT imaging of the thoracic and lumbar spine was performed without contrast. Multiplanar CT image reconstructions were also generated. RADIATION DOSE REDUCTION: This exam was performed according to the departmental dose-optimization program which includes automated exposure control, adjustment of the mA and/or kV according to patient size and/or use of iterative reconstruction technique. COMPARISON:  MR lumbar spine 04/23/2018 FINDINGS: CT THORACIC SPINE FINDINGS  Alignment: Grade 1 anterolisthesis of T1 on T2, T2 on T3, T3 on T4, T4 on T5.\. Vertebrae: No acute fracture or focal pathologic process. Paraspinal and other soft tissues: Negative. Disc levels: Multilevel intervertebral disc space vacuum phenomenon. CT LUMBAR SPINE FINDINGS Segmentation: 5 lumbar type vertebrae. Alignment: Stable grade 1 anterolisthesis of L3 on L4 and L4 on L5. Vertebrae: L3-L5 posterolateral surgical hardware. No acute fracture or focal pathologic process. Paraspinal and other soft tissues: Negative. Disc levels: Multilevel intervertebral disc space vacuum phenomenon. Other: Coarsened interlobular softball thickening. Peripheral reticulations. Atherosclerotic plaque. Colonic diverticulosis. IMPRESSION: CT THORACIC SPINE IMPRESSION No acute displaced fracture or traumatic listhesis of the thoracic spine. CT LUMBAR SPINE IMPRESSION No acute displaced fracture or traumatic listhesis of the lumbar spine. Aortic Atherosclerosis (ICD10-I70.0). Electronically Signed   By: Morgane  Naveau M.D.   On: 11/17/2023 00:47   CT Lumbar Spine Wo Contrast Result Date: 11/17/2023 CLINICAL DATA:  Back trauma, no prior imaging (Age >= 16y) EXAM: CT THORACIC AND LUMBAR SPINE WITHOUT CONTRAST TECHNIQUE: Multidetector CT imaging of the thoracic and lumbar spine was performed without contrast. Multiplanar CT image reconstructions were also  generated. RADIATION DOSE REDUCTION: This exam was performed according to the departmental dose-optimization program which includes automated exposure control, adjustment of the mA and/or kV according to patient size and/or use of iterative reconstruction technique. COMPARISON:  MR lumbar spine 04/23/2018 FINDINGS: CT THORACIC SPINE FINDINGS Alignment: Grade 1 anterolisthesis of T1 on T2, T2 on T3, T3 on T4, T4 on T5.\. Vertebrae: No acute fracture or focal pathologic process. Paraspinal and other soft tissues: Negative. Disc levels: Multilevel intervertebral disc space vacuum phenomenon. CT LUMBAR SPINE FINDINGS Segmentation: 5 lumbar type vertebrae. Alignment: Stable grade 1 anterolisthesis of L3 on L4 and L4 on L5. Vertebrae: L3-L5 posterolateral surgical hardware. No acute fracture or focal pathologic process. Paraspinal and other soft tissues: Negative. Disc levels: Multilevel intervertebral disc space vacuum phenomenon. Other: Coarsened interlobular softball thickening. Peripheral reticulations. Atherosclerotic plaque. Colonic diverticulosis. IMPRESSION: CT THORACIC SPINE IMPRESSION No acute displaced fracture or traumatic listhesis of the thoracic spine. CT LUMBAR SPINE IMPRESSION No acute displaced fracture or traumatic listhesis of the lumbar spine. Aortic Atherosclerosis (ICD10-I70.0). Electronically Signed   By: Morgane  Naveau M.D.   On: 11/17/2023 00:47   CT Head Wo Contrast Result Date: 11/16/2023 CLINICAL DATA:  Head trauma, minor (Age >= 65y); Neck trauma (Age >= 65y). Fall EXAM: CT HEAD WITHOUT CONTRAST CT CERVICAL SPINE WITHOUT CONTRAST TECHNIQUE: Multidetector CT imaging of the head and cervical spine was performed following the standard protocol without intravenous contrast. Multiplanar CT image reconstructions of the cervical spine were also generated. RADIATION DOSE REDUCTION: This exam was performed according to the departmental dose-optimization program which includes automated exposure  control, adjustment of the mA and/or kV according to patient size and/or use of iterative reconstruction technique. COMPARISON:  CT head 12/06/2021, CT head and C-spine 10/22/2021 FINDINGS: CT HEAD FINDINGS Brain: Cerebral ventricle sizes are concordant with the degree of cerebral volume loss. Patchy and confluent areas of decreased attenuation are noted throughout the deep and periventricular white matter of the cerebral hemispheres bilaterally, compatible with chronic microvascular ischemic disease. No evidence of large-territorial acute infarction. No parenchymal hemorrhage. No mass lesion. No extra-axial collection. No mass effect or midline shift. No hydrocephalus. Basilar cisterns are patent. Vascular: No hyperdense vessel. Atherosclerotic calcifications are present within the cavernous internal carotid and vertebral arteries. Skull: Old healed right orbital floor fracture. No acute fracture or focal lesion. Sinuses/Orbits: Paranasal sinuses  and mastoid air cells are clear. Bilateral lens replacement. Otherwise the orbits are unremarkable. Other: None. CT CERVICAL SPINE FINDINGS Alignment: Grade 1 anterolisthesis of C2 on C3, grade 1 anterolisthesis of C3 on C4. Retrolisthesis of C4 on C5 and C5 on C6. Skull base and vertebrae: Multilevel severe degenerative change of the spine with associated severe osseous neural foraminal stenosis of the right C4-C5 and C5-C6 level. No severe osseous central canal stenosis. No acute fracture. No aggressive appearing focal osseous lesion or focal pathologic process. Soft tissues and spinal canal: No prevertebral fluid or swelling. No visible canal hematoma. Upper chest: Biapical pleural/pulmonary scarring with associated calcifications on the left. Chronic coarsened interstitial markings. Other: Atherosclerotic plaque of the carotid arteries within the neck. IMPRESSION: 1. No acute intracranial abnormality. 2. No acute displaced fracture or traumatic listhesis of the  cervical spine. 3. Multilevel severe degenerative change of the spine with associated severe osseous neural foraminal stenosis of the right C4-C5 and C5-C6 level. Electronically Signed   By: Morgane  Naveau M.D.   On: 11/16/2023 23:11   CT Cervical Spine Wo Contrast Result Date: 11/16/2023 CLINICAL DATA:  Head trauma, minor (Age >= 65y); Neck trauma (Age >= 65y). Fall EXAM: CT HEAD WITHOUT CONTRAST CT CERVICAL SPINE WITHOUT CONTRAST TECHNIQUE: Multidetector CT imaging of the head and cervical spine was performed following the standard protocol without intravenous contrast. Multiplanar CT image reconstructions of the cervical spine were also generated. RADIATION DOSE REDUCTION: This exam was performed according to the departmental dose-optimization program which includes automated exposure control, adjustment of the mA and/or kV according to patient size and/or use of iterative reconstruction technique. COMPARISON:  CT head 12/06/2021, CT head and C-spine 10/22/2021 FINDINGS: CT HEAD FINDINGS Brain: Cerebral ventricle sizes are concordant with the degree of cerebral volume loss. Patchy and confluent areas of decreased attenuation are noted throughout the deep and periventricular white matter of the cerebral hemispheres bilaterally, compatible with chronic microvascular ischemic disease. No evidence of large-territorial acute infarction. No parenchymal hemorrhage. No mass lesion. No extra-axial collection. No mass effect or midline shift. No hydrocephalus. Basilar cisterns are patent. Vascular: No hyperdense vessel. Atherosclerotic calcifications are present within the cavernous internal carotid and vertebral arteries. Skull: Old healed right orbital floor fracture. No acute fracture or focal lesion. Sinuses/Orbits: Paranasal sinuses and mastoid air cells are clear. Bilateral lens replacement. Otherwise the orbits are unremarkable. Other: None. CT CERVICAL SPINE FINDINGS Alignment: Grade 1 anterolisthesis of C2 on  C3, grade 1 anterolisthesis of C3 on C4. Retrolisthesis of C4 on C5 and C5 on C6. Skull base and vertebrae: Multilevel severe degenerative change of the spine with associated severe osseous neural foraminal stenosis of the right C4-C5 and C5-C6 level. No severe osseous central canal stenosis. No acute fracture. No aggressive appearing focal osseous lesion or focal pathologic process. Soft tissues and spinal canal: No prevertebral fluid or swelling. No visible canal hematoma. Upper chest: Biapical pleural/pulmonary scarring with associated calcifications on the left. Chronic coarsened interstitial markings. Other: Atherosclerotic plaque of the carotid arteries within the neck. IMPRESSION: 1. No acute intracranial abnormality. 2. No acute displaced fracture or traumatic listhesis of the cervical spine. 3. Multilevel severe degenerative change of the spine with associated severe osseous neural foraminal stenosis of the right C4-C5 and C5-C6 level. Electronically Signed   By: Morgane  Naveau M.D.   On: 11/16/2023 23:11   DG Hip Unilat With Pelvis 2-3 Views Right Result Date: 11/16/2023 CLINICAL DATA:  fall, pain EXAM: DG HIP (WITH OR WITHOUT  PELVIS) 2-3V RIGHT COMPARISON:  CT abdomen pelvis 03/06/2022 FINDINGS: Cortical irregularity with likely acute displaced and comminuted right inferior pubic rami fracture. There is no evidence of hip fracture or dislocation of the right hip. No acute displaced fracture or dislocation of the left hip. No diastasis of the bones of the pelvis. Surgical hardware noted along the lower visualized lumbar spine. There is no evidence of arthropathy or other focal bone abnormality. Vascular calcifications. IMPRESSION: Cortical irregularity with likely acute displaced and comminuted right inferior pubic rami fracture. Electronically Signed   By: Morgane  Naveau M.D.   On: 11/16/2023 23:05    ROS 10 point review of systems is negative except as listed above in HPI.   Physical  Exam Blood pressure (!) 132/96, pulse (!) 103, temperature 97.9 F (36.6 C), resp. rate (!) 35, SpO2 97%. Constitutional: well-developed, well-nourished HEENT: pupils equal, round, reactive to light, 2mm b/l, moist conjunctiva, external inspection of ears and nose normal, hearing intact Oropharynx: normal oropharyngeal mucosa, poor dentition Neck: no thyromegaly, trachea midline, no midline cervical tenderness to palpation Chest: breath sounds equal bilaterally, normal respiratory effort, no midline or lateral chest wall tenderness to palpation/deformity Abdomen: soft, lower abdominal TTP, no bruising, no hepatosplenomegaly GU: normal female genitalia  Extremities: 2+ radial and pedal pulses bilaterally Skin: warm, dry, no rashes  Assessment/Plan: Fall  R superior and inferior pubic rami fx - ortho c/s,Dr. Elsa, notified by EDP, UA pending, place foley, suspect she will need a CT cysto Pelvic hematoma with active extrav - IR c/s, Dr. Johann, notified at [phone] for urgent AE FEN - regular diet DVT - SCDs, hold chemical ppx due to bleeding concerns Dispo - ICU   Critical care time:  Dreama GEANNIE Hanger, MD General and Trauma Surgery [facility]

## 2023-11-17 NOTE — Progress Notes (Deleted)
 OT Cancellation Note  Patient Details Name: Kathy Velez MRN: [id number] DOB: 22-Jul-1931   Cancelled Treatment:    Reason Eval/Treat Not Completed: Medical issues which prohibited therapy.  Spoke with primary RN, discussed unstable fractures and potential surgeries for L ankle, L humerus early this week.  OT will await follow up treatment and continue as appropriate.    Lavonia Eager D Corrine Tillis 11/17/2023, 9:00 AM 11/17/2023  RP, OTR/L  Acute Rehabilitation Services  Office:  [phone]

## 2023-11-17 NOTE — ED Notes (Signed)
 CCMD notified. Pt is on monitor.

## 2023-11-17 NOTE — ED Notes (Signed)
 Pt brief & linen changed. Placed purewick

## 2023-11-17 NOTE — Progress Notes (Signed)
 PT Cancellation Note  Patient Details Name: Neka Bise MRN: [id number] DOB: 1931-05-10   Cancelled Treatment:    Reason Eval/Treat Not Completed: Pain limiting ability to participate;Patient not medically ready. Pt just off bedrest from removal of femoral sheath. Spoke with RN. Currently addressing med regime for better pain management. PT to hold eval until tomorrow.    Erven Sari Shaker 11/17/2023, 10:59 AM

## 2023-11-17 NOTE — Progress Notes (Signed)
 Patient has been noted to have multiple brief episodes of A-fib with RVR, bbut promptly flipping out of it. However, has began to increase in frequency as the afternoon has persisted. Dr. Stevie notified of the events, and the decision to administer 5mg  of Lopressor  was made. Patient is hemodynamically and neurologically stable otherwise, and having no to minimal pain when assessing pain.  Danial Hlavac, RN

## 2023-11-17 NOTE — Procedures (Signed)
  Procedure:  R pelvic arteriogram and subselective embolization  gelfoam slurry (temporary embolic) Preprocedure diagnosis: The primary encounter diagnosis was Fall, initial encounter. Diagnoses of Multiple closed fractures of pelvis with disruption of pelvic ring, initial encounter [facility]) and Female pelvic hematoma were also pertinent to this visit. Postprocedure diagnosis: same EBL:    minimal Complications:   none immediate  See full dictation in [employer].  CHARM Toribio Faes MD Main # [phone] Pager  [phone] Mobile [phone]

## 2023-11-17 NOTE — Consult Note (Signed)
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 other soft tissues: Negative. Disc levels: Multilevel intervertebral disc space vacuum phenomenon. Other: Coarsened interlobular softball thickening. Peripheral reticulations. Atherosclerotic plaque. Colonic diverticulosis. IMPRESSION: CT THORACIC SPINE IMPRESSION No acute displaced fracture or traumatic listhesis of the thoracic spine. CT LUMBAR SPINE IMPRESSION No acute displaced fracture or traumatic listhesis of the lumbar spine. Aortic Atherosclerosis (ICD10-I70.0). Electronically Signed   By: Morgane  Naveau M.D.   On: 11/17/2023 00:47   CT Head Wo Contrast Result Date: 11/16/2023 CLINICAL DATA:  Head trauma, minor (Age >= 65y); Neck trauma (Age >= 65y). Fall EXAM: CT HEAD WITHOUT CONTRAST CT CERVICAL SPINE WITHOUT CONTRAST TECHNIQUE: Multidetector CT imaging of the head and cervical spine was performed following the standard protocol without intravenous contrast. Multiplanar CT image reconstructions of the cervical spine were also generated. RADIATION DOSE REDUCTION: This exam was performed according to the departmental dose-optimization program which  includes automated exposure control, adjustment of the mA and/or kV according to patient size and/or use of iterative reconstruction technique. COMPARISON:  CT head 12/06/2021, CT head and C-spine 10/22/2021 FINDINGS: CT HEAD FINDINGS Brain: Cerebral ventricle sizes are concordant with the degree of cerebral volume loss. Patchy and confluent areas of decreased attenuation are noted throughout the deep and periventricular white matter of the cerebral hemispheres bilaterally, compatible with chronic microvascular ischemic disease. No evidence of large-territorial acute infarction. No parenchymal hemorrhage. No mass lesion. No extra-axial collection. No mass effect or midline shift. No hydrocephalus. Basilar cisterns are patent. Vascular: No hyperdense vessel. Atherosclerotic calcifications are present within the cavernous internal carotid and vertebral arteries. Skull: Old  healed right orbital floor fracture. No acute fracture or focal lesion. Sinuses/Orbits: Paranasal sinuses and mastoid air cells are clear. Bilateral lens replacement. Otherwise the orbits are unremarkable. Other: None. CT CERVICAL SPINE FINDINGS Alignment: Grade 1 anterolisthesis of C2 on C3, grade 1 anterolisthesis of C3 on C4. Retrolisthesis of C4 on C5 and C5 on C6. Skull base and vertebrae: Multilevel severe degenerative change of the spine with associated severe osseous neural foraminal stenosis of the right C4-C5 and C5-C6 level. No severe osseous central canal stenosis. No acute fracture. No aggressive appearing focal osseous lesion or focal pathologic process. Soft tissues and spinal canal: No prevertebral fluid or swelling. No visible canal hematoma. Upper chest: Biapical pleural/pulmonary scarring with associated calcifications on the left. Chronic coarsened interstitial markings. Other: Atherosclerotic plaque of the carotid arteries within the neck. IMPRESSION: 1. No acute intracranial abnormality. 2. No acute displaced fracture or  traumatic listhesis of the cervical spine. 3. Multilevel severe degenerative change of the spine with associated severe osseous neural foraminal stenosis of the right C4-C5 and C5-C6 level. Electronically Signed   By: Morgane  Naveau M.D.   On: 11/16/2023 23:11   CT Cervical Spine Wo Contrast Result Date: 11/16/2023 CLINICAL DATA:  Head trauma, minor (Age >= 65y); Neck trauma (Age >= 65y). Fall EXAM: CT HEAD WITHOUT CONTRAST CT CERVICAL SPINE WITHOUT CONTRAST TECHNIQUE: Multidetector CT imaging of the head and cervical spine was performed following the standard protocol without intravenous contrast. Multiplanar CT image reconstructions of the cervical spine were also generated. RADIATION DOSE REDUCTION: This exam was performed according to the departmental dose-optimization program which includes automated exposure control, adjustment of the mA and/or kV according to patient size and/or use of iterative reconstruction technique. COMPARISON:  CT head 12/06/2021, CT head and C-spine 10/22/2021 FINDINGS: CT HEAD FINDINGS Brain: Cerebral ventricle sizes are concordant with the degree of cerebral volume loss. Patchy and confluent areas of decreased attenuation are noted throughout the deep and periventricular white matter of the cerebral hemispheres bilaterally, compatible with chronic microvascular ischemic disease. No evidence of large-territorial acute infarction. No parenchymal hemorrhage. No mass lesion. No extra-axial collection. No mass effect or midline shift. No hydrocephalus. Basilar cisterns are patent. Vascular: No hyperdense vessel. Atherosclerotic calcifications are present within the cavernous internal carotid and vertebral arteries. Skull: Old healed right orbital floor fracture. No acute fracture or focal lesion. Sinuses/Orbits: Paranasal sinuses and mastoid air cells are clear. Bilateral lens replacement. Otherwise the orbits are unremarkable. Other: None. CT CERVICAL SPINE FINDINGS Alignment: Grade  1 anterolisthesis of C2 on C3, grade 1 anterolisthesis of C3 on C4. Retrolisthesis of C4 on C5 and C5 on C6. Skull base and vertebrae: Multilevel severe degenerative change of the spine with associated severe osseous neural foraminal stenosis of the right C4-C5 and C5-C6 level. No severe osseous central canal stenosis. No acute fracture. No aggressive appearing focal osseous lesion or focal pathologic process. Soft tissues and spinal canal: No prevertebral fluid or swelling. No visible canal hematoma. Upper chest: Biapical pleural/pulmonary scarring with associated calcifications on the left. Chronic coarsened interstitial markings. Other: Atherosclerotic plaque of the carotid arteries within the neck. IMPRESSION: 1. No acute intracranial abnormality. 2. No acute displaced fracture or traumatic listhesis of the cervical spine. 3. Multilevel severe degenerative change of the spine with associated severe osseous neural foraminal stenosis of the right C4-C5 and C5-C6 level. Electronically Signed   By: Morgane  Naveau M.D.   On: 11/16/2023 23:11   DG Hip Unilat With Pelvis 2-3 Views Right  Result Date: 11/16/2023 CLINICAL DATA:  fall, pain EXAM: DG HIP (WITH OR WITHOUT PELVIS) 2-3V RIGHT COMPARISON:  CT abdomen pelvis 03/06/2022 FINDINGS: Cortical irregularity with likely acute displaced and comminuted right inferior pubic rami fracture. There is no evidence of hip fracture or dislocation of the right hip. No acute displaced fracture or dislocation of the left hip. No diastasis of the bones of the pelvis. Surgical hardware noted along the lower visualized lumbar spine. There is no evidence of arthropathy or other focal bone abnormality. Vascular calcifications. IMPRESSION: Cortical irregularity with likely acute displaced and comminuted right inferior pubic rami fracture. Electronically Signed   By: Morgane  Naveau M.D.   On: 11/16/2023 23:05    Review of Systems  Constitutional: Negative.   Respiratory:  Negative.    Cardiovascular: Negative.   Gastrointestinal: Negative.   Genitourinary: Negative.   Musculoskeletal:        Anterior pelvic pain and left posterior shoulder pain  Skin: Negative.   Neurological: Negative.   Psychiatric/Behavioral: Negative.     Blood pressure (!) 132/96, pulse (!) 103, temperature 97.9 F (36.6 C), resp. rate (!) 35, SpO2 97%. Physical Exam HENT:     Head: Atraumatic.     Mouth/Throat:     Mouth: Mucous membranes are moist.  Eyes:     Extraocular Movements: Extraocular movements intact.  Cardiovascular:     Rate and Rhythm: Tachycardia present.     Pulses: Normal pulses.  Pulmonary:     Effort: Pulmonary effort is normal.  Abdominal:     Palpations: Abdomen is soft.  Musculoskeletal:     Cervical back: Neck supple.     Comments: Pain to palpation on the anterior aspect of the pelvis.  No tenderness to palpation or crepitus noted in bilateral lower extremities or bilateral upper extremities.  Did not assess for shoulder range of motion.  Neurological:     General: No focal deficit present.     Mental Status: She is alert.  Psychiatric:        Mood and Affect: Mood normal.     Assessment/Plan: Anterior pubic symphysis fracture with extravasation status post IR embolization Posterior left shoulder pain  Patient has a pubic symphysis fracture.  Will treat this nonoperatively.  She may weight-bear as tolerated bilateral lower extremities.  With regard to her left shoulder pain there is no scapular fracture noted.  She does have rather significant glenohumeral joint arthrosis.  Once she mobilizes with therapy if she needs some form of immobilization she may use a sling for comfort but for now we will hold off on that.  She may follow-up with me in the office in 3 to 4 weeks for pelvic x-rays if she continues to have discomfort.  Kathy Velez 11/17/2023, 8:09 AM

## 2023-11-17 NOTE — ED Notes (Signed)
 Pt transported to IR by TRN.

## 2023-11-17 NOTE — Consult Note (Signed)
 Chief Complaint: Patient was seen in consultation today for pelvic fracture with active arterial extravasation  at the request of A. Lovick MD  Referring Physician(s): Paola Bolk  Supervising Physician: Johann Sieving  Patient Status: [facility] - Out-pt  History of Present Illness: Kathy Velez is a 89 y.o. female on eliquis , found down with hip deformity, CT shows R ischiopubic rami fractures with large adjacent hematoma and active arterial extravasation. Patient currently hemodynamically stable. Andexxa  being administered. Left hemipelvis unremarkable. Mild atherosclerotic disease.  No contrast allergy or renal disease.  Past Medical History:  Diagnosis Date   Allergy    seasonal   Anemia    Anxiety    Arthritis    Back   Asymptomatic menopausal state    Atrial fibrillation (HCC)    Cancer (HCC) 2006   Colon.  Basal Cell Skin cancer- right arm   Cataract    removed bilateral   Chronic atrial fibrillation (HCC)    Colon cancer (HCC)    Constipation due to pain medication therapy    after heart surgery   Deficiency of other specified B group vitamins    Diplopia    Disorientation, unspecified    Dysrhythmia    PAF   GERD (gastroesophageal reflux disease)    Heart murmur    Hyperlipidemia    Hypertension    Hypothyroidism    Malignant neoplasm of colon, unspecified (HCC)    Other specified diseases of blood and blood-forming organs    Other specified disorders of bone density and structure, unspecified forearm    Other thrombophilia (HCC)    per matrix   Overactive bladder    per Matrix   Personal history of other malignant neoplasm of large intestine    Presbycusis, bilateral    per matrix   RA (rheumatoid arthritis) (HCC)    Restless leg    Seizures (HCC)    after Heart Surgey   Stroke (HCC)    TIA- found by neurologist after    Valgus deformity, not elsewhere classified, right knee    per matrix   Vascular dementia, mild, without behavioral  disturbance, psychotic disturbance, mood disturbance, and anxiety (HCC)    per matrix    Past Surgical History:  Procedure Laterality Date   ABDOMINAL HYSTERECTOMY  1970   Partial    COLON RESECTION  2006   cancer   COLON SURGERY     COLONOSCOPY     EYE SURGERY Bilateral    Cataract   MAXIMUM ACCESS (MAS)POSTERIOR LUMBAR INTERBODY FUSION (PLIF) 2 LEVEL N/A 04/12/2015   Procedure: Lumbar Three-Five Decompression, Pedicle Screw Fixation, and Posteriolateral Arthrodesis;  Surgeon: Fairy Levels, MD;  Location: [facility];  Service: Neurosurgery;  Laterality: N/A;  L3-4 L4-5 Maximum access posterior lumbar fusion, possible interbodies and resection of synovial cyst at L4-5   MITRAL VALVE REPAIR  01/20/2013   Gore-tex cords to P1, P2, and P3. Magic suture to posterior medial commisure, #30 Physio 1 ring. Done in [place]    TONSILLECTOMY     about 1940   TRICUSPID VALVE SURGERY  01/20/2013   #28 TriAd ring done in [place]     Allergies: Claritin [loratadine], Codeine, Gabapentin , Molds & smuts, Pollen extract, Bee venom, and Wasp venom  Medications: Prior to Admission medications   Medication Sig Start Date End Date Taking? Authorizing Provider  mirtazapine  (REMERON ) 7.5 MG tablet Take 7.5 mg by mouth at bedtime. 11/01/23  Yes [provider]  acetaminophen  (TYLENOL ) 500 MG tablet Take 1,000 mg  by mouth in the morning and at bedtime.    [provider]  amoxicillin (AMOXIL) 500 MG capsule Take 2,000 mg by mouth as needed. 03/02/23   [provider]  apixaban  (ELIQUIS ) 2.5 MG TABS tablet Take 1 tablet (2.5 mg total) by mouth 2 (two) times daily. 12/14/22   Croitoru, Mihai, MD  atorvastatin  (LIPITOR) 20 MG tablet Take by mouth daily.    [provider]  Cholecalciferol  (VITAMIN D ) 50 MCG (2000 UT) CAPS Take 2,000 Units by mouth in the morning.    [provider]  cloNIDine  (CATAPRES ) 0.1 MG tablet Take 1 tablet (0.1 mg total) by mouth daily as  needed. If SBP >180 only 02/08/22   Darlean Maus, NP  ENBREL SURECLICK 50 MG/ML injection Inject 50 mg into the skin once a week.    [provider]  furosemide  (LASIX ) 20 MG tablet Take 1 tablet (20 mg total) by mouth every other day. Can take an additional does of lasix  for a weight gain 2-3 lbs overnight or increased swelling of the legs. 10/23/23   Fargo, Amy E, NP  furosemide  (LASIX ) 20 MG tablet Take 1 tablet (20 mg total) by mouth daily for 2 days. 10/22/23 10/24/23  Fargo, Amy E, NP  Iron , Ferrous Sulfate , 325 (65 Fe) MG TABS Take 325 mg by mouth daily. 03/03/22   Darlean Maus, NP  leflunomide  (ARAVA ) 20 MG tablet Take 20 mg by mouth in the morning. 10/05/19   [provider]  levalbuterol  (XOPENEX ) 0.63 MG/3ML nebulizer solution Take 3 mLs (0.63 mg total) by nebulization every 6 (six) hours as needed for wheezing or shortness of breath. 08/23/23   Darlean Maus, NP  levothyroxine  (SYNTHROID ) 88 MCG tablet Take 88 mcg by mouth daily before breakfast.    [provider]  melatonin 5 MG TABS Take 5 mg by mouth at bedtime.    [provider]  METOPROLOL  SUCCINATE PO Take 150 mg by mouth in the morning.    [provider]  mirtazapine  (REMERON ) 15 MG tablet Take 0.5 tablets (7.5 mg total) by mouth at bedtime. Take 7.5 mg each night for two weeks then discontinue 10/21/23   Wert, Christina, NP  Multiple Vitamin (MULTIVITAMIN ADULT PO) Take 1 tablet by mouth every morning.    [provider]  Multiple Vitamins-Minerals (PRESERVISION AREDS 2) CAPS Take 1 capsule by mouth in the morning and at bedtime.    [provider]  omeprazole  (PRILOSEC) 40 MG capsule TAKE 1 CAPSULE DAILY 10/17/21   Charlanne Fredia CROME, MD  UNABLE TO FIND Apply topically daily. Med Name: JOBST hose    [provider]  valsartan  (DIOVAN ) 160 MG tablet Take 160 mg by mouth daily.    [provider]  vitamin B-12 (CYANOCOBALAMIN ) 1000 MCG tablet Take  1,000 mcg by mouth in the morning.    [provider]     Family History  Problem Relation Age of Onset   Lung disease Mother 5   Heart disease Father 73   Colon polyps Daughter    Alcohol abuse Son    Colon cancer Neg Hx    Stomach cancer Neg Hx    Rectal cancer Neg Hx    Crohn's disease Neg Hx    Esophageal cancer Neg Hx     Social History   Socioeconomic History   Marital status: Widowed    Spouse name: Not on file   Number of children: Not on file   Years of education:  Not on file   Highest education level: Not on file  Occupational History   Occupation: elementary school teacher    Comment: retired   Tobacco Use   Smoking status: Never    Passive exposure: Past (mother smoked)   Smokeless tobacco: Never  Vaping Use   Vaping status: Never Used  Substance and Sexual Activity   Alcohol use: Yes    Alcohol/week: 1.0 standard drink of alcohol    Types: 1 Glasses of wine per week    Comment: 1-2 per week   Drug use: No   Sexual activity: Not Currently  Other Topics Concern   Not on file  Social History Narrative   Social History      Diet? Healthy- low salt, sugar, fat      Do you drink/eat things with caffeine? On occasion      Marital status?        widow                        What year were you married? 1958      Do you live in a house, apartment, assisted living, condo, trailer, etc.? apartment      Is it one or more stories? one      How many persons live in your home? one      Do you have any pets in your home? (please list) no      Highest level of education completed? 4 year college      Current or past profession: Insurance account manager      Do you exercise?             yes                         Type & how often? Classes- senior retirement community/ some walking      Advanced Directives      Do you have a living will?      Do you have a DNR form?                                  If not, do you want to discuss one?      Do you  have signed POA/HPOA for forms?       Functional Status Completed by: Daughter, Adin Palin      Do you have difficulty bathing or dressing yourself? no      Do you have difficulty preparing food or eating? no      Do you have difficulty managing your medications? no      Do you have difficulty managing your finances? no      Do you have difficulty affording your medications? no   Right handed   [facility]   Beautiful daughter    Social Drivers of Health   Financial Resource Strain: Low Risk  (01/28/2018)   Overall Financial Resource Strain (CARDIA)    Difficulty of Paying Living Expenses: Not hard at all  Food Insecurity: No Food Insecurity (03/08/2022)   Hunger Vital Sign    Worried About Running Out of Food in the Last Year: Never true    Ran Out of Food in the Last Year: Never true  Transportation Needs: No Transportation Needs (03/08/2022)   PRAPARE - Administrator, Civil Service (Medical): No    Lack  of Transportation (Non-Medical): No  Physical Activity: Sufficiently Active (01/28/2018)   Exercise Vital Sign    Days of Exercise per Week: 6 days    Minutes of Exercise per Session: 30 min  Stress: No Stress Concern Present (01/28/2018)   [employer] of Occupational Health - Occupational Stress Questionnaire    Feeling of Stress : Only a little  Social Connections: Moderately Isolated (01/28/2018)   Social Connection and Isolation Panel    Frequency of Communication with Friends and Family: More than three times a week    Frequency of Social Gatherings with Friends and Family: More than three times a week    Attends Religious Services: Never    Database administrator or Organizations: No    Attends Banker Meetings: Never    Marital Status: Widowed    ECOG Status: 3 - Symptomatic, >50% confined to bed  Review of Systems: A 12 point ROS discussed and pertinent positives are indicated in the HPI above.  All other systems are  negative.  Review of Systems  Vital Signs: BP 109/66   Pulse 89   Temp 97.9 F (36.6 C)   Resp 19   SpO2 94%     Physical Exam  A&O x3, HOH R femoral region accessible for access. GOod pulse.  Imaging: CT CHEST W CONTRAST Result Date: 11/17/2023 CLINICAL DATA:  Fall, with suspected acute comminuted right inferior pubic ramus fracture, concern is for urinary injury. Blunt polytrauma with potential for acute chest trauma as well. EXAM: CT CHEST WITH CONTRAST CT ABDOMEN AND PELVIS WITH AND WITHOUT CONTRAST TECHNIQUE: Multidetector CT imaging of the chest was performed during intravenous contrast administration. Multidetector CT imaging of the abdomen and pelvis was performed prior to and following the standard protocol before and during bolus administration of intravenous contrast. RADIATION DOSE REDUCTION: This exam was performed according to the departmental dose-optimization program which includes automated exposure control, adjustment of the mA and/or kV according to patient size and/or use of iterative reconstruction technique. CONTRAST:  OMNIPAQUE  IOHEXOL  350 MG/ML SOLN COMPARISON:  Most recent chest x-ray was PA Lat chest 09/23/2023, before that chest radiograph 08/06/2022. Comparison is made with the chest, abdomen and pelvis CT with contrast 03/31/2021, abdomen and pelvis CT with IV contrast 03/06/2022. FINDINGS: CT CHEST FINDINGS Cardiovascular: The heart is slightly enlarged. Again noted are sternotomy sutures with prosthetic tricuspid and mitral valves. There is chronic left atrial enlargement and central venous prominence. Patchy three-vessel coronary artery calcification. Enlarged pulmonary trunk again measures 3.6 cm indicating arterial hypertension. There is no central embolus on this nontailored exam. There is atherosclerosis in the aorta and great vessels without aneurysm, stenosis or dissection. No pericardial effusion. Mediastinum/Nodes: Small small hiatal hernia.  Unremarkable esophagus. Thyroid  gland is not seen and could be removed, ablated or extremely atrophic. There is no intrathoracic or axillary adenopathy. The trachea is ectatic but clear. There is tracheobronchial chondrocalcinosis. Lungs/Pleura: There is biapical symmetric pleuroparenchymal scarring, calcifications and bronchiolectasis. There is mild centrilobular emphysema in the upper lobes, and subpleural reticulation in the lung fields with an upper lobe predominance consistent with chronic interstitial lung disease. There is increased consolidation posteriorly in the right middle lobe which is most likely either due to pneumonia or aspiration. There is increased posterior atelectasis in the basilar lower lobes. Interval new 6 mm subpleural left upper lobe nodule on 5:55. There is a new pleural-based 5 mm left lower lobe nodule on 5:117. There is new branching nodularity in the posterolateral  left base consistent with small airway impactions. Similar new branching nodularity right lower lobe on 5:112, anterior left upper lobe on 5:48 and in the lingula on 5:75. There is no pleural effusion.  No pneumothorax. Musculoskeletal: Osteopenia, mild kyphosis and degenerative change thoracic spine. No acute or significant osseous findings. The CT ABDOMEN AND PELVIS FINDINGS Hepatobiliary: No focal liver abnormality is seen. No gallstones, gallbladder wall thickening, or biliary dilatation. Pancreas: Unremarkable. No pancreatic ductal dilatation or surrounding inflammatory changes. Spleen: Normal in size without focal abnormality. Adrenals/Urinary Tract: No adrenal mass is seen. There is no renal mass enhancement. Stable 1 cm Bosniak 1 cyst anterior right kidney. Occasional subcentimeter Bosniak 2 cortical cysts of both kidneys are too small to characterize. There is cortical thinning in both kidneys. No urinary stone or obstruction. The bladder wall is unremarkable, but the bladder is displaced into the left hemipelvis  and mildly compressed by a sizable right perivesical extraperitoneal hematoma. No contrast extravasation outside of the bladder is seen. Stomach/Bowel: Prior partial right hemicolectomy with primary ileocolic anastomosis. No dilatation or overt wall thickening. Advanced sigmoid diverticulosis without evidence of diverticulitis. Vascular/Lymphatic: Large hematoma in the right hemipelvis along side the bladder, measuring 9.4 x 6.7 x 9.4 cm with active bleeding into the hematoma and scattered contrast levels on the delayed exam. On the initial arterial phase images there is curvilinear contrast within the collection, but it could not be traced back to a specific vessel. Additional free hemoperitoneum collects above the bladder dome to the left and right. Reproductive: Status post hysterectomy. No adnexal masses. Other: Free hemorrhage above the bladder and hematoma to the right of the bladder as above. No free air. Musculoskeletal: Comminuted fracture, not significantly displaced involving medial aspect of the right superior pubic ramus and the mid to medial aspect of the right inferior pubic ramus, with swelling in the adjacent right obturator internus and externus. There is osteopenia, degenerative and postsurgical change of the spine. No other regional skeletal fracture is evident. IMPRESSION: 1. Hematoma in the right hemipelvis along side the bladder, measuring 9.4 x 6.7 x 9.4 cm, with active bleeding into the hematoma and scattered contrast levels on the delayed exam. The active bleeding could not be traced back to a specific vessel. 2. Comminuted fractures of the right superior and inferior pubic rami. 3. No urinary bladder extravasation is seen. 4. No other acute trauma related findings in the chest, abdomen or pelvis. 5. Chronic interstitial lung disease with increased consolidation in the posterior right middle lobe which could be due to pneumonia or aspiration. 6. New 6 mm left upper lobe and 5 mm left lower  lobe nodules. Follow-up as indicated taking into account age and life expectancy. 7. Scattered peripheral branching nodularity new from prior study consistent with small airway impactions. 8. Aortic and coronary artery atherosclerosis. 9. Cardiomegaly with chronic left atrial enlargement and central venous prominence. Tricuspid and mitral prosthesis. 10. Enlarged pulmonary trunk indicating arterial hypertension. 11. Small hiatal hernia. 12. Advanced sigmoid diverticulosis. 13. Osteopenia and degenerative change. 14. Emphysema. 15. Critical Value/emergent results were called by telephone at the time of interpretation on 11/17/2023 at 4:09 am to provider [facility] , who verbally acknowledged these results. Aortic Atherosclerosis (ICD10-I70.0) and Emphysema (ICD10-J43.9). Electronically Signed   By: Francis Quam M.D.   On: 11/17/2023 04:17   CT ABDOMEN PELVIS W WO CONTRAST Result Date: 11/17/2023 CLINICAL DATA:  Fall, with suspected acute comminuted right inferior pubic ramus fracture, concern is for urinary injury. Blunt polytrauma  with potential for acute chest trauma as well. EXAM: CT CHEST WITH CONTRAST CT ABDOMEN AND PELVIS WITH AND WITHOUT CONTRAST TECHNIQUE: Multidetector CT imaging of the chest was performed during intravenous contrast administration. Multidetector CT imaging of the abdomen and pelvis was performed prior to and following the standard protocol before and during bolus administration of intravenous contrast. RADIATION DOSE REDUCTION: This exam was performed according to the departmental dose-optimization program which includes automated exposure control, adjustment of the mA and/or kV according to patient size and/or use of iterative reconstruction technique. CONTRAST:  OMNIPAQUE  IOHEXOL  350 MG/ML SOLN COMPARISON:  Most recent chest x-ray was PA Lat chest 09/23/2023, before that chest radiograph 08/06/2022. Comparison is made with the chest, abdomen and pelvis CT with contrast  03/31/2021, abdomen and pelvis CT with IV contrast 03/06/2022. FINDINGS: CT CHEST FINDINGS Cardiovascular: The heart is slightly enlarged. Again noted are sternotomy sutures with prosthetic tricuspid and mitral valves. There is chronic left atrial enlargement and central venous prominence. Patchy three-vessel coronary artery calcification. Enlarged pulmonary trunk again measures 3.6 cm indicating arterial hypertension. There is no central embolus on this nontailored exam. There is atherosclerosis in the aorta and great vessels without aneurysm, stenosis or dissection. No pericardial effusion. Mediastinum/Nodes: Small small hiatal hernia. Unremarkable esophagus. Thyroid  gland is not seen and could be removed, ablated or extremely atrophic. There is no intrathoracic or axillary adenopathy. The trachea is ectatic but clear. There is tracheobronchial chondrocalcinosis. Lungs/Pleura: There is biapical symmetric pleuroparenchymal scarring, calcifications and bronchiolectasis. There is mild centrilobular emphysema in the upper lobes, and subpleural reticulation in the lung fields with an upper lobe predominance consistent with chronic interstitial lung disease. There is increased consolidation posteriorly in the right middle lobe which is most likely either due to pneumonia or aspiration. There is increased posterior atelectasis in the basilar lower lobes. Interval new 6 mm subpleural left upper lobe nodule on 5:55. There is a new pleural-based 5 mm left lower lobe nodule on 5:117. There is new branching nodularity in the posterolateral left base consistent with small airway impactions. Similar new branching nodularity right lower lobe on 5:112, anterior left upper lobe on 5:48 and in the lingula on 5:75. There is no pleural effusion.  No pneumothorax. Musculoskeletal: Osteopenia, mild kyphosis and degenerative change thoracic spine. No acute or significant osseous findings. The CT ABDOMEN AND PELVIS FINDINGS  Hepatobiliary: No focal liver abnormality is seen. No gallstones, gallbladder wall thickening, or biliary dilatation. Pancreas: Unremarkable. No pancreatic ductal dilatation or surrounding inflammatory changes. Spleen: Normal in size without focal abnormality. Adrenals/Urinary Tract: No adrenal mass is seen. There is no renal mass enhancement. Stable 1 cm Bosniak 1 cyst anterior right kidney. Occasional subcentimeter Bosniak 2 cortical cysts of both kidneys are too small to characterize. There is cortical thinning in both kidneys. No urinary stone or obstruction. The bladder wall is unremarkable, but the bladder is displaced into the left hemipelvis and mildly compressed by a sizable right perivesical extraperitoneal hematoma. No contrast extravasation outside of the bladder is seen. Stomach/Bowel: Prior partial right hemicolectomy with primary ileocolic anastomosis. No dilatation or overt wall thickening. Advanced sigmoid diverticulosis without evidence of diverticulitis. Vascular/Lymphatic: Large hematoma in the right hemipelvis along side the bladder, measuring 9.4 x 6.7 x 9.4 cm with active bleeding into the hematoma and scattered contrast levels on the delayed exam. On the initial arterial phase images there is curvilinear contrast within the collection, but it could not be traced back to a specific vessel. Additional free hemoperitoneum collects  above the bladder dome to the left and right. Reproductive: Status post hysterectomy. No adnexal masses. Other: Free hemorrhage above the bladder and hematoma to the right of the bladder as above. No free air. Musculoskeletal: Comminuted fracture, not significantly displaced involving medial aspect of the right superior pubic ramus and the mid to medial aspect of the right inferior pubic ramus, with swelling in the adjacent right obturator internus and externus. There is osteopenia, degenerative and postsurgical change of the spine. No other regional skeletal fracture  is evident. IMPRESSION: 1. Hematoma in the right hemipelvis along side the bladder, measuring 9.4 x 6.7 x 9.4 cm, with active bleeding into the hematoma and scattered contrast levels on the delayed exam. The active bleeding could not be traced back to a specific vessel. 2. Comminuted fractures of the right superior and inferior pubic rami. 3. No urinary bladder extravasation is seen. 4. No other acute trauma related findings in the chest, abdomen or pelvis. 5. Chronic interstitial lung disease with increased consolidation in the posterior right middle lobe which could be due to pneumonia or aspiration. 6. New 6 mm left upper lobe and 5 mm left lower lobe nodules. Follow-up as indicated taking into account age and life expectancy. 7. Scattered peripheral branching nodularity new from prior study consistent with small airway impactions. 8. Aortic and coronary artery atherosclerosis. 9. Cardiomegaly with chronic left atrial enlargement and central venous prominence. Tricuspid and mitral prosthesis. 10. Enlarged pulmonary trunk indicating arterial hypertension. 11. Small hiatal hernia. 12. Advanced sigmoid diverticulosis. 13. Osteopenia and degenerative change. 14. Emphysema. 15. Critical Value/emergent results were called by telephone at the time of interpretation on 11/17/2023 at 4:09 am to provider [facility] , who verbally acknowledged these results. Aortic Atherosclerosis (ICD10-I70.0) and Emphysema (ICD10-J43.9). Electronically Signed   By: Francis Quam M.D.   On: 11/17/2023 04:17   CT PELVIS WO CONTRAST Result Date: 11/17/2023 CLINICAL DATA:  Hip trauma, fracture suspected, xray done EXAM: CT PELVIS WITHOUT CONTRAST TECHNIQUE: Multidetector CT imaging of the pelvis was performed following the standard protocol without intravenous contrast. RADIATION DOSE REDUCTION: This exam was performed according to the departmental dose-optimization program which includes automated exposure control, adjustment of the mA  and/or kV according to patient size and/or use of iterative reconstruction technique. COMPARISON:  X-ray right hip 11/16/2023 FINDINGS: Urinary Tract:  Irregular right urinary bladder wall thick. Bowel: Colonic diverticulosis. Unremarkable visualized pelvic bowel loops. Vascular/Lymphatic: No pathologically enlarged lymph nodes. No significant vascular abnormality seen. Reproductive:  No mass or other significant abnormality Other: Large volume right space of Rezius high density free fluid. No intraperitoneal free gas. No organized fluid collection. Musculoskeletal: Acute comminuted and displaced right inferior pubic rami and superior pubic rami/pubic symphysis fracture. Acute nondisplaced right iliac fracture (9:22; 4:50). Asymmetric and irregular right internal and external obturator musculature. IMPRESSION: 1. Acute comminuted and displaced right inferior pubic rami and superior pubic rami/pubic symphysis fracture. 2. Acute nondisplaced right iliac fracture. 3. Large volume right extraperitoneal hematoma with likely associated extraperitoneal urinary bladder injury. Intraperitoneal urinary bladder injury not excluded. Recommend CT urogram. 4. Right internal and external obturator intramuscular hematoma. Electronically Signed   By: Morgane  Naveau M.D.   On: 11/17/2023 00:53   CT Thoracic Spine Wo Contrast Result Date: 11/17/2023 CLINICAL DATA:  Back trauma, no prior imaging (Age >= 16y) EXAM: CT THORACIC AND LUMBAR SPINE WITHOUT CONTRAST TECHNIQUE: Multidetector CT imaging of the thoracic and lumbar spine was performed without contrast. Multiplanar CT image reconstructions were also generated. RADIATION  DOSE REDUCTION: This exam was performed according to the departmental dose-optimization program which includes automated exposure control, adjustment of the mA and/or kV according to patient size and/or use of iterative reconstruction technique. COMPARISON:  MR lumbar spine 04/23/2018 FINDINGS: CT THORACIC  SPINE FINDINGS Alignment: Grade 1 anterolisthesis of T1 on T2, T2 on T3, T3 on T4, T4 on T5.\. Vertebrae: No acute fracture or focal pathologic process. Paraspinal and other soft tissues: Negative. Disc levels: Multilevel intervertebral disc space vacuum phenomenon. CT LUMBAR SPINE FINDINGS Segmentation: 5 lumbar type vertebrae. Alignment: Stable grade 1 anterolisthesis of L3 on L4 and L4 on L5. Vertebrae: L3-L5 posterolateral surgical hardware. No acute fracture or focal pathologic process. Paraspinal and other soft tissues: Negative. Disc levels: Multilevel intervertebral disc space vacuum phenomenon. Other: Coarsened interlobular softball thickening. Peripheral reticulations. Atherosclerotic plaque. Colonic diverticulosis. IMPRESSION: CT THORACIC SPINE IMPRESSION No acute displaced fracture or traumatic listhesis of the thoracic spine. CT LUMBAR SPINE IMPRESSION No acute displaced fracture or traumatic listhesis of the lumbar spine. Aortic Atherosclerosis (ICD10-I70.0). Electronically Signed   By: Morgane  Naveau M.D.   On: 11/17/2023 00:47   CT Lumbar Spine Wo Contrast Result Date: 11/17/2023 CLINICAL DATA:  Back trauma, no prior imaging (Age >= 16y) EXAM: CT THORACIC AND LUMBAR SPINE WITHOUT CONTRAST TECHNIQUE: Multidetector CT imaging of the thoracic and lumbar spine was performed without contrast. Multiplanar CT image reconstructions were also generated. RADIATION DOSE REDUCTION: This exam was performed according to the departmental dose-optimization program which includes automated exposure control, adjustment of the mA and/or kV according to patient size and/or use of iterative reconstruction technique. COMPARISON:  MR lumbar spine 04/23/2018 FINDINGS: CT THORACIC SPINE FINDINGS Alignment: Grade 1 anterolisthesis of T1 on T2, T2 on T3, T3 on T4, T4 on T5.\. Vertebrae: No acute fracture or focal pathologic process. Paraspinal and other soft tissues: Negative. Disc levels: Multilevel intervertebral disc  space vacuum phenomenon. CT LUMBAR SPINE FINDINGS Segmentation: 5 lumbar type vertebrae. Alignment: Stable grade 1 anterolisthesis of L3 on L4 and L4 on L5. Vertebrae: L3-L5 posterolateral surgical hardware. No acute fracture or focal pathologic process. Paraspinal and other soft tissues: Negative. Disc levels: Multilevel intervertebral disc space vacuum phenomenon. Other: Coarsened interlobular softball thickening. Peripheral reticulations. Atherosclerotic plaque. Colonic diverticulosis. IMPRESSION: CT THORACIC SPINE IMPRESSION No acute displaced fracture or traumatic listhesis of the thoracic spine. CT LUMBAR SPINE IMPRESSION No acute displaced fracture or traumatic listhesis of the lumbar spine. Aortic Atherosclerosis (ICD10-I70.0). Electronically Signed   By: Morgane  Naveau M.D.   On: 11/17/2023 00:47   CT Head Wo Contrast Result Date: 11/16/2023 CLINICAL DATA:  Head trauma, minor (Age >= 65y); Neck trauma (Age >= 65y). Fall EXAM: CT HEAD WITHOUT CONTRAST CT CERVICAL SPINE WITHOUT CONTRAST TECHNIQUE: Multidetector CT imaging of the head and cervical spine was performed following the standard protocol without intravenous contrast. Multiplanar CT image reconstructions of the cervical spine were also generated. RADIATION DOSE REDUCTION: This exam was performed according to the departmental dose-optimization program which includes automated exposure control, adjustment of the mA and/or kV according to patient size and/or use of iterative reconstruction technique. COMPARISON:  CT head 12/06/2021, CT head and C-spine 10/22/2021 FINDINGS: CT HEAD FINDINGS Brain: Cerebral ventricle sizes are concordant with the degree of cerebral volume loss. Patchy and confluent areas of decreased attenuation are noted throughout the deep and periventricular white matter of the cerebral hemispheres bilaterally, compatible with chronic microvascular ischemic disease. No evidence of large-territorial acute infarction. No parenchymal  hemorrhage. No mass lesion. No extra-axial  collection. No mass effect or midline shift. No hydrocephalus. Basilar cisterns are patent. Vascular: No hyperdense vessel. Atherosclerotic calcifications are present within the cavernous internal carotid and vertebral arteries. Skull: Old healed right orbital floor fracture. No acute fracture or focal lesion. Sinuses/Orbits: Paranasal sinuses and mastoid air cells are clear. Bilateral lens replacement. Otherwise the orbits are unremarkable. Other: None. CT CERVICAL SPINE FINDINGS Alignment: Grade 1 anterolisthesis of C2 on C3, grade 1 anterolisthesis of C3 on C4. Retrolisthesis of C4 on C5 and C5 on C6. Skull base and vertebrae: Multilevel severe degenerative change of the spine with associated severe osseous neural foraminal stenosis of the right C4-C5 and C5-C6 level. No severe osseous central canal stenosis. No acute fracture. No aggressive appearing focal osseous lesion or focal pathologic process. Soft tissues and spinal canal: No prevertebral fluid or swelling. No visible canal hematoma. Upper chest: Biapical pleural/pulmonary scarring with associated calcifications on the left. Chronic coarsened interstitial markings. Other: Atherosclerotic plaque of the carotid arteries within the neck. IMPRESSION: 1. No acute intracranial abnormality. 2. No acute displaced fracture or traumatic listhesis of the cervical spine. 3. Multilevel severe degenerative change of the spine with associated severe osseous neural foraminal stenosis of the right C4-C5 and C5-C6 level. Electronically Signed   By: Morgane  Naveau M.D.   On: 11/16/2023 23:11   CT Cervical Spine Wo Contrast Result Date: 11/16/2023 CLINICAL DATA:  Head trauma, minor (Age >= 65y); Neck trauma (Age >= 65y). Fall EXAM: CT HEAD WITHOUT CONTRAST CT CERVICAL SPINE WITHOUT CONTRAST TECHNIQUE: Multidetector CT imaging of the head and cervical spine was performed following the standard protocol without intravenous  contrast. Multiplanar CT image reconstructions of the cervical spine were also generated. RADIATION DOSE REDUCTION: This exam was performed according to the departmental dose-optimization program which includes automated exposure control, adjustment of the mA and/or kV according to patient size and/or use of iterative reconstruction technique. COMPARISON:  CT head 12/06/2021, CT head and C-spine 10/22/2021 FINDINGS: CT HEAD FINDINGS Brain: Cerebral ventricle sizes are concordant with the degree of cerebral volume loss. Patchy and confluent areas of decreased attenuation are noted throughout the deep and periventricular white matter of the cerebral hemispheres bilaterally, compatible with chronic microvascular ischemic disease. No evidence of large-territorial acute infarction. No parenchymal hemorrhage. No mass lesion. No extra-axial collection. No mass effect or midline shift. No hydrocephalus. Basilar cisterns are patent. Vascular: No hyperdense vessel. Atherosclerotic calcifications are present within the cavernous internal carotid and vertebral arteries. Skull: Old healed right orbital floor fracture. No acute fracture or focal lesion. Sinuses/Orbits: Paranasal sinuses and mastoid air cells are clear. Bilateral lens replacement. Otherwise the orbits are unremarkable. Other: None. CT CERVICAL SPINE FINDINGS Alignment: Grade 1 anterolisthesis of C2 on C3, grade 1 anterolisthesis of C3 on C4. Retrolisthesis of C4 on C5 and C5 on C6. Skull base and vertebrae: Multilevel severe degenerative change of the spine with associated severe osseous neural foraminal stenosis of the right C4-C5 and C5-C6 level. No severe osseous central canal stenosis. No acute fracture. No aggressive appearing focal osseous lesion or focal pathologic process. Soft tissues and spinal canal: No prevertebral fluid or swelling. No visible canal hematoma. Upper chest: Biapical pleural/pulmonary scarring with associated calcifications on the left.  Chronic coarsened interstitial markings. Other: Atherosclerotic plaque of the carotid arteries within the neck. IMPRESSION: 1. No acute intracranial abnormality. 2. No acute displaced fracture or traumatic listhesis of the cervical spine. 3. Multilevel severe degenerative change of the spine with associated severe osseous neural foraminal stenosis  of the right C4-C5 and C5-C6 level. Electronically Signed   By: Morgane  Naveau M.D.   On: 11/16/2023 23:11   DG Hip Unilat With Pelvis 2-3 Views Right Result Date: 11/16/2023 CLINICAL DATA:  fall, pain EXAM: DG HIP (WITH OR WITHOUT PELVIS) 2-3V RIGHT COMPARISON:  CT abdomen pelvis 03/06/2022 FINDINGS: Cortical irregularity with likely acute displaced and comminuted right inferior pubic rami fracture. There is no evidence of hip fracture or dislocation of the right hip. No acute displaced fracture or dislocation of the left hip. No diastasis of the bones of the pelvis. Surgical hardware noted along the lower visualized lumbar spine. There is no evidence of arthropathy or other focal bone abnormality. Vascular calcifications. IMPRESSION: Cortical irregularity with likely acute displaced and comminuted right inferior pubic rami fracture. Electronically Signed   By: Morgane  Naveau M.D.   On: 11/16/2023 23:05    Labs:  CBC: Recent Labs    04/04/23 0000 08/02/23 0000 08/13/23 1130 11/16/23 2210 11/17/23 0350  WBC 5.8 5.8 7.6 10.5  --   HGB 11.0* 11.2* 11.8* 10.6* 9.5*  HCT 33* 34* 35.0* 32.1* 30.2*  PLT 144* 167 177 196  --     COAGS: Recent Labs    11/16/23 2210  INR 1.2    BMP: Recent Labs    02/05/23 1146 08/02/23 0000 09/19/23 0000 10/02/23 0000 11/16/23 2210  NA 138 133* 135* 134* 136  K 4.1 4.2 4.3 4.3 4.1  CL 104 99 98* 97* 102  CO2 24 23* 16 20 22   GLUCOSE 96  --   --   --  121*  BUN 34* 21 34* 32* 28*  CALCIUM  9.8 9.2 8.8 9.1 9.1  CREATININE 1.15* 0.9 1.0 1.2* 1.04*  GFRNONAA 46*  --   --   --  51*    LIVER FUNCTION  TESTS: Recent Labs    12/31/22 0000 02/05/23 1146 08/02/23 0000 11/16/23 2210  BILITOT  --  0.8  --  0.8  AST 26 18 25  44*  ALT 36* 19 22 42  ALKPHOS 104 91 118 104  PROT  --  7.1  --  6.6  ALBUMIN 4.1 4.1  --  3.6    TUMOR MARKERS: Recent Labs    02/05/23 1146 08/13/23 1130  CEA 5.45* 6.51*    Assessment and Plan:  Acute ongoing arterial extravasation into large hematoma at site of acute comminuted R pelvic fracture. CT demonstrates safe approach for selective arterial embolization with temporary embolic agent (gelfoam). Discussed with pt and family member the technique of arteriography and embolization, anticipated benefits, possible risks and complications including bleeding, allergy, nontarget embo, tissue/organ damage/necrosis. They seemed to understand and asked appropriate questions that were answered.  Plan to proceed with R int iliac arteriogram and gelfoam embolization via R CFA approach with Celt closure.  Thank you for this interesting consult.  I greatly enjoyed meeting Ceria Suminski and look forward to participating in their care.  A copy of this report was sent to the requesting provider on this date.  Electronically Signed: Dayne Lene Mckay, MD 11/17/2023, 5:19 AM   I spent a total of 40 Minutes    in face to face in clinical consultation, greater than 50% of which was counseling/coordinating care for pelvic bleeding.

## 2023-11-17 NOTE — ED Notes (Addendum)
 Sent message to pharmacy about ANDEXXA  med. Per pharmacy medication will take 30-45 minutes to reconstitute and will be hand delivered to unit. PA-C notified.

## 2023-11-17 NOTE — ED Notes (Signed)
 Patient transported to CT

## 2023-11-18 LAB — CBC
HCT: 23.4 % — ABNORMAL LOW (ref 36.0–46.0)
HCT: 22.7 % — ABNORMAL LOW (ref 36.0–46.0)
Hemoglobin: 7.8 g/dL — ABNORMAL LOW (ref 12.0–15.0)
Hemoglobin: 7.6 g/dL — ABNORMAL LOW (ref 12.0–15.0)
MCH: 35.3 pg — ABNORMAL HIGH (ref 26.0–34.0)
MCH: 35.2 pg — ABNORMAL HIGH (ref 26.0–34.0)
MCHC: 33.3 g/dL (ref 30.0–36.0)
MCHC: 33.5 g/dL (ref 30.0–36.0)
MCV: 105.9 fL — ABNORMAL HIGH (ref 80.0–100.0)
MCV: 105.1 fL — ABNORMAL HIGH (ref 80.0–100.0)
Platelets: 143 K/uL — ABNORMAL LOW (ref 150–400)
Platelets: 128 K/uL — ABNORMAL LOW (ref 150–400)
RBC: 2.21 MIL/uL — ABNORMAL LOW (ref 3.87–5.11)
RBC: 2.16 MIL/uL — ABNORMAL LOW (ref 3.87–5.11)
RDW: 16.5 % — ABNORMAL HIGH (ref 11.5–15.5)
RDW: 16.7 % — ABNORMAL HIGH (ref 11.5–15.5)
WBC: 9.8 K/uL (ref 4.0–10.5)
WBC: 10.2 K/uL (ref 4.0–10.5)
nRBC: 0 % (ref 0.0–0.2)
nRBC: 0.2 % (ref 0.0–0.2)

## 2023-11-18 LAB — BASIC METABOLIC PANEL WITH GFR
Anion gap: 8 (ref 5–15)
BUN: 25 mg/dL — ABNORMAL HIGH (ref 8–23)
CO2: 23 mmol/L (ref 22–32)
Calcium: 8.5 mg/dL — ABNORMAL LOW (ref 8.9–10.3)
Chloride: 104 mmol/L (ref 98–111)
Creatinine, Ser: 0.89 mg/dL (ref 0.44–1.00)
GFR, Estimated: >60 mL/min (ref >60)
Glucose, Bld: 100 mg/dL — ABNORMAL HIGH (ref 70–99)
Potassium: 4.3 mmol/L (ref 3.5–5.1)
Sodium: 135 mmol/L (ref 135–145)

## 2023-11-18 NOTE — TOC Initial Note (Addendum)
 Transition of Care [facility]) - Initial/Assessment Note    Patient Details  Name: Kathy Velez MRN: [id number] Date of Birth: 18-Jun-1931  [facility]) CM/SW Contact:    Meline Russaw E Aquila Delaughter, LCSW Phone Number: 11/18/2023, 9:32 AM  Clinical Narrative:                 Patient was admitted post fall.  CSW spoke with patient's daughter Adin. Adin states patient resides at [facility] in their SNF. Patient uses a RW at baseline. Has a wheelchair for long distances. Adin states her plan is for patient to return to [facility] SNF when medically ready. She is aware PT/OT evals are pending. CSW called [place] at [facility], left a VM requesting a return call.   12:45- Call attempted to [place] at [facility], left another VM.  2:00- Spoke with [place] at [facility]. She confirms patient can return when medically ready and receive STR. They do not require a Medicare 3 night stay.   Expected Discharge Plan: Skilled Nursing Facility Barriers to Discharge: Continued Medical Work up   Patient Goals and CMS Choice   CMS Medicare.gov Compare Post Acute Care list provided to:: Patient Represenative (must comment) Choice offered to / list presented to : Adult Children      Expected Discharge Plan and Services       Living arrangements for the past 2 months: Skilled Nursing Facility                                      Prior Living Arrangements/Services Living arrangements for the past 2 months: Skilled Nursing Facility Lives with:: Facility Resident Patient language and need for interpreter reviewed:: Yes        Need for Family Participation in Patient Care: Yes (Comment) Care giver support system in place?: Yes (comment) Current home services: DME Criminal Activity/Legal Involvement Pertinent to Current Situation/Hospitalization: No - Comment as needed  Activities of Daily Living      Permission Sought/Granted                  Emotional Assessment          Alcohol / Substance Use: Not Applicable Psych Involvement: No (comment)  Admission diagnosis:  Pelvic fracture (HCC) [S32.9XXA] Female pelvic hematoma [N94.89] Fall, initial encounter [W19.XXXA] Multiple closed fractures of pelvis with disruption of pelvic ring, initial encounter (HCC) [S32.810A] Patient Active Problem List   Diagnosis Date Noted   Pelvic fracture (HCC) 11/17/2023   Chronic heart failure (HCC) 10/19/2023   Hypercholesterolemia 04/17/2023   History of colon cancer 03/08/2022   Diverticulosis of large intestine with hemorrhage 03/08/2022   GIB (gastrointestinal bleeding) 03/06/2022   Closed bimalleolar fracture of right ankle 02/02/2022   Scalp hematoma 12/06/2021   GERD without esophagitis 12/06/2021   Frequent falls 12/06/2021   Hyponatremia 12/06/2021   Aortic atherosclerosis (HCC) 07/26/2021   Vascular dementia without behavioral disturbance (HCC) 04/25/2021   Fecal incontinence 04/25/2021   Colon cancer (HCC) 04/25/2021   Psychophysiological insomnia 10/11/2019   DNR (do not resuscitate) 10/11/2019   Transient ischemic attack (TIA) 03/09/2019   Pain in right knee 02/12/2018   Osteopenia of forearm 12/11/2017   Primary osteoarthritis of both knees 12/11/2017   Permanent atrial fibrillation (HCC) 11/22/2017   Hypercoagulable state due to atrial fibrillation (HCC) 10/11/2017   A-fib (HCC) 10/11/2017   Diplopia 10/11/2017   Presbycusis of both  ears 10/11/2017   Acquired hammertoes of both feet 10/11/2017   Macrocytosis without anemia 10/11/2017   Genu valgum, right 10/11/2017   History of multiple strokes 10/11/2017   Mild cognitive impairment with memory loss 10/11/2017   Overactive bladder 10/11/2017   Mixed hyperlipidemia 09/03/2017   History of colon cancer, stage III 01/17/2017   Carotid stenosis 12/13/2016   Chronic seasonal allergic rhinitis 06/06/2016   Interstitial lung disease (HCC) 12/28/2015   Hemispheric carotid artery syndrome  07/18/2015   Hypothyroidism 05/05/2015   Rheumatoid arthritis (HCC) 05/05/2015   Constipation 05/05/2015   Vitamin B12 deficiency 05/05/2015   Iron  deficiency anemia 05/02/2015   Atrial flutter (HCC) 04/13/2015   Status post mitral valve repair 04/13/2015   Status post tricuspid valve repair 04/13/2015   Spinal stenosis of lumbar region 02/02/2015   RLS (restless legs syndrome) 11/10/2013   Essential hypertension 07/14/2013   Depression 07/02/2011   PCP:  Charlanne Fredia CROME, MD Pharmacy:   [facility] - [place], [place] - [address] [place] [address] Phone: [phone] Fax: [phone]     Social Drivers of Health (SDOH) Social History: SDOH Screenings   Food Insecurity: No Food Insecurity (03/08/2022)  Housing: Low Risk  (03/08/2022)  Transportation Needs: No Transportation Needs (03/08/2022)  Utilities: Not At Risk (03/08/2022)  Alcohol Screen: Low Risk  (04/16/2018)  Depression (PHQ2-9): Low Risk  (10/23/2023)  Financial Resource Strain: Low Risk  (01/28/2018)  Physical Activity: Sufficiently Active (01/28/2018)  Social Connections: Moderately Isolated (01/28/2018)  Stress: No Stress Concern Present (01/28/2018)  Tobacco Use: Low Risk  (11/16/2023)   SDOH Interventions:     Readmission Risk Interventions     No data to display

## 2023-11-18 NOTE — TOC CAGE-AID Note (Signed)
 Transition of Care [facility]) - CAGE-AID Screening  Patient Details  Name: Kathy Velez MRN: [id number] Date of Birth: 18-Jul-1931  Clinical Narrative:  Patient denies any alcohol or drug use, no need for substance abuse resources at this time.  CAGE-AID Screening:   Have You Ever Felt You Ought to Cut Down on Your Drinking or Drug Use?: No Have People Annoyed You By Critizing Your Drinking Or Drug Use?: No Have You Felt Bad Or Guilty About Your Drinking Or Drug Use?: No Have You Ever Had a Drink or Used Drugs First Thing In The Morning to Steady Your Nerves or to Get Rid of a Hangover?: No CAGE-AID Score: 0  Substance Abuse Education Offered: No

## 2023-11-18 NOTE — Evaluation (Signed)
 Occupational Therapy Evaluation Patient Details Name: Kathy Velez MRN: [id number] DOB: Jul 15, 1931 Today's Date: 11/18/2023   History of Present Illness   89 y.o. female  for unwitnessed fall while at SNF, report of downtime. R superior and inferior pubic rami fx with pelvic hematoma (on Eliquis  for afib) s/p extravasation PMH- rheumatoid arthritis, afib on Eliquis , colon Ca, diplopia, HLD, HTN, seizures, TIA, dementia     Clinical Impressions Pt from [name], was using rollator for mobility, had assist for ADLs and was walking to dining hall for meals. Pt currently needing up to max A for ADLs, mod-max A +2 for bed mobility and mod +2 for transfers with and without RW. Pt unable to weight shift to RLE to step. Pt desatting to 86% on RA, improved to 90s with 1L O2 donned. Pt presenting with impairments listed below, will follow acutely. Patient will benefit from continued inpatient follow up therapy, <3 hours/day to maximize safety/ind with ADL/functional mobility.      If plan is discharge home, recommend the following:   Two people to help with walking and/or transfers;A lot of help with bathing/dressing/bathroom;Assistance with cooking/housework;Direct supervision/assist for medications management;Direct supervision/assist for financial management;Assist for transportation;Help with stairs or ramp for entrance     Functional Status Assessment   Patient has had a recent decline in their functional status and demonstrates the ability to make significant improvements in function in a reasonable and predictable amount of time.     Equipment Recommendations   Other (comment) (defer)     Recommendations for Other Services   PT consult     Precautions/Restrictions   Precautions Precautions: Fall Precaution/Restrictions Comments: has knee brace for R knee -- R knee buckles at baseline, watch O2 Restrictions Weight Bearing Restrictions Per Provider Order: Yes RLE  Weight Bearing Per Provider Order: Weight bearing as tolerated LLE Weight Bearing Per Provider Order: Weight bearing as tolerated     Mobility Bed Mobility Overal bed mobility: Needs Assistance Bed Mobility: Supine to Sit, Sit to Supine     Supine to sit: Mod assist, HOB elevated Sit to supine: Max assist, +2 for physical assistance        Transfers Overall transfer level: Needs assistance Equipment used: Rolling walker (2 wheels), 2 person hand held assist Transfers: Sit to/from Stand Sit to Stand: Mod assist, +2 physical assistance                  Balance Overall balance assessment: Needs assistance, History of Falls Sitting-balance support: No upper extremity supported, Feet supported Sitting balance-Leahy Scale: Fair     Standing balance support: Bilateral upper extremity supported Standing balance-Leahy Scale: Poor                             ADL either performed or assessed with clinical judgement   ADL Overall ADL's : Needs assistance/impaired Eating/Feeding: Minimal assistance;Sitting   Grooming: Minimal assistance;Sitting   Upper Body Bathing: Moderate assistance;Sitting   Lower Body Bathing: Moderate assistance;Sitting/lateral leans   Upper Body Dressing : Moderate assistance;Sitting   Lower Body Dressing: Moderate assistance;Sitting/lateral leans   Toilet Transfer: Moderate assistance;+2 for physical assistance   Toileting- Clothing Manipulation and Hygiene: Maximal assistance       Functional mobility during ADLs: Moderate assistance;+2 for physical assistance       Vision   Vision Assessment?: No apparent visual deficits     Perception Perception: Not tested  Praxis Praxis: Not tested       Pertinent Vitals/Pain Pain Assessment Pain Assessment: Faces Pain Score: 6  Faces Pain Scale: Hurts even more Pain Location: Rt pelvis Pain Descriptors / Indicators: Discomfort, Guarding Pain Intervention(s): Limited  activity within patient's tolerance, Monitored during session, Repositioned     Extremity/Trunk Assessment Upper Extremity Assessment Upper Extremity Assessment: Generalized weakness   Lower Extremity Assessment Lower Extremity Assessment: Defer to PT evaluation RLE Deficits / Details: limited AROM in supine due to pain; able to tolerate 90 hip/knee flexion in sitting EOB   Cervical / Trunk Assessment Cervical / Trunk Assessment: Kyphotic   Communication Communication Communication: No apparent difficulties   Cognition Arousal: Alert Behavior During Therapy: Flat affect               OT - Cognition Comments: hx dementia                 Following commands: Impaired Following commands impaired: Follows one step commands with increased time     Cueing  General Comments   Cueing Techniques: Verbal cues;Tactile cues  pt desatting to 86% on RA, incr to 1L O2 with SpO2 in 90s   Exercises     Shoulder Instructions      Home Living Family/patient expects to be discharged to:: Skilled nursing facility                                 Additional Comments: from LTC per daughter;      Prior Functioning/Environment Prior Level of Function : Needs assist             Mobility Comments: used rollator for walking to dining room; longer distances used wheelchair due to fatigue, dyspnea ADLs Comments: had assist for ADLs    OT Problem List: Decreased strength;Decreased range of motion;Decreased activity tolerance;Impaired balance (sitting and/or standing);Decreased cognition;Decreased safety awareness   OT Treatment/Interventions: Self-care/ADL training;Therapeutic exercise;Energy conservation;DME and/or AE instruction;Therapeutic activities;Patient/family education;Balance training      OT Goals(Current goals can be found in the care plan section)   Acute Rehab OT Goals Patient Stated Goal: none stated OT Goal Formulation: With patient Time  For Goal Achievement: 12/02/23 Potential to Achieve Goals: Good ADL Goals Pt Will Perform Upper Body Dressing: with contact guard assist;sitting Pt Will Perform Lower Body Dressing: with min assist;sitting/lateral leans;sit to/from stand Pt Will Transfer to Toilet: with min assist;ambulating;regular height toilet Additional ADL Goal #1: pt will perform bed mobility CGA in prep for ADLs   OT Frequency:  Min 2X/week    Co-evaluation PT/OT/SLP Co-Evaluation/Treatment: Yes Reason for Co-Treatment: Complexity of the patient's impairments (multi-system involvement);Necessary to address cognition/behavior during functional activity;To address functional/ADL transfers;For patient/therapist safety PT goals addressed during session: Mobility/safety with mobility;Proper use of DME OT goals addressed during session: ADL's and self-care;Strengthening/ROM      AM-PAC OT 6 Clicks Daily Activity     Outcome Measure Help from another person eating meals?: A Little Help from another person taking care of personal grooming?: A Little Help from another person toileting, which includes using toliet, bedpan, or urinal?: A Lot Help from another person bathing (including washing, rinsing, drying)?: A Lot Help from another person to put on and taking off regular upper body clothing?: A Little Help from another person to put on and taking off regular lower body clothing?: A Lot 6 Click Score: 15   End of Session Equipment Utilized During Treatment:  Gait belt;Rolling walker (2 wheels);Oxygen Nurse Communication: Mobility status  Activity Tolerance: Patient tolerated treatment well Patient left: in bed;with call bell/phone within reach;with bed alarm set;with family/visitor present;with nursing/sitter in room  OT Visit Diagnosis: Unsteadiness on feet (R26.81);Other abnormalities of gait and mobility (R26.89);Muscle weakness (generalized) (M62.81);History of falling (Z91.81)                Time:  9072-9044 OT Time Calculation (min): 28 min Charges:  OT General Charges $OT Visit: 1 Visit OT Evaluation $OT Eval Moderate Complexity: 1 Mod  Kathy Velez, OTD, OTR/L SecureChat Preferred Acute Rehab [phone]   Kathy Velez 11/18/2023, 12:55 PM

## 2023-11-18 NOTE — Progress Notes (Addendum)
 Patient ID: Kathy Velez, female   DOB: 04-09-1931, 89 y.o.   MRN: [id number] Follow up - Trauma Critical Care   Patient Details:    Kathy Velez is an 89 y.o. female.  Lines/tubes : Urethral Catheter Odell Bean, RN Double-lumen;Latex;Straight-tip 14 Fr. (Active)  Indication for Insertion or Continuance of Catheter Unstable critically ill patients first 24-48 hours (See Criteria) 11/17/23 2000  Site Assessment Clean, Dry, Intact 11/17/23 2000  Catheter Maintenance Bag below level of bladder;Catheter secured;Drainage bag/tubing not touching floor;Insertion date on drainage bag;No dependent loops;Seal intact;Bag emptied prior to transport 11/17/23 2000  Collection Container Standard drainage bag 11/17/23 2000  Securement Method Adhesive securement device 11/17/23 2000  Urinary Catheter Interventions (if applicable) Unclamped 11/17/23 0933  Output (mL) 200 mL 11/18/23 0500    Microbiology/Sepsis markers: Results for orders placed or performed during the hospital encounter of 11/16/23  MRSA Next Gen by PCR, Nasal     Status: None   Collection Time: 11/17/23  9:47 AM   Specimen: Nasal Mucosa; Nasal Swab  Result Value Ref Range Status   MRSA by PCR Next Gen NOT DETECTED NOT DETECTED Final    Comment: (NOTE) The GeneXpert MRSA Assay (FDA approved for NASAL specimens only), is one component of a comprehensive MRSA colonization surveillance program. It is not intended to diagnose MRSA infection nor to guide or monitor treatment for MRSA infections. Test performance is not FDA approved in patients less than 23 years old. Performed at [facility], [address], [place], [place] [address]     Anti-infectives:  Anti-infectives (From admission, onward)    None      Consults: Treatment Team:  Elsa Lonni SAUNDERS, MD    Studies:    Events:  Subjective:    Overnight Issues: stable  Objective:  Vital signs for last 24 hours: Temp:  [97.3 F (36.3 C)-98.4 F (36.9  C)] 97.8 F (36.6 C) (08/25 0800) Pulse Rate:  [78-120] 84 (08/25 0800) Resp:  [14-38] 17 (08/25 0800) BP: (85-166)/(66-127) 139/71 (08/25 0800) SpO2:  [89 %-100 %] 100 % (08/25 0800) Weight:  [58.1 kg] 58.1 kg (08/25 0800)  Hemodynamic parameters for last 24 hours:    Intake/Output from previous day: 08/24 0701 - 08/25 0700 In: 805.8 [I.V.:805.8] Out: 825 [Urine:825]  Intake/Output this shift: No intake/output data recorded.  Vent settings for last 24 hours:    Physical Exam:  General: alert and no respiratory distress Neuro: alert and F/C HEENT/Neck: no JVD Resp: clear to auscultation bilaterally CVS: irreg GI: soft, NT, ND Extremities: calves soft  Results for orders placed or performed during the hospital encounter of 11/16/23 (from the past 24 hours)  MRSA Next Gen by PCR, Nasal     Status: None   Collection Time: 11/17/23  9:47 AM   Specimen: Nasal Mucosa; Nasal Swab  Result Value Ref Range   MRSA by PCR Next Gen NOT DETECTED NOT DETECTED  CBC     Status: Abnormal   Collection Time: 11/17/23 10:30 AM  Result Value Ref Range   WBC 13.1 (H) 4.0 - 10.5 K/uL   RBC 2.71 (L) 3.87 - 5.11 MIL/uL   Hemoglobin 9.4 (L) 12.0 - 15.0 g/dL   HCT 71.4 (L) 63.9 - 53.9 %   MCV 105.2 (H) 80.0 - 100.0 fL   MCH 34.7 (H) 26.0 - 34.0 pg   MCHC 33.0 30.0 - 36.0 g/dL   RDW 83.6 (H) 88.4 - 84.4 %   Platelets 173 150 - 400 K/uL  nRBC 0.0 0.0 - 0.2 %  Basic metabolic panel     Status: Abnormal   Collection Time: 11/17/23 10:30 AM  Result Value Ref Range   Sodium 136 135 - 145 mmol/L   Potassium 4.1 3.5 - 5.1 mmol/L   Chloride 106 98 - 111 mmol/L   CO2 20 (L) 22 - 32 mmol/L   Glucose, Bld 141 (H) 70 - 99 mg/dL   BUN 23 8 - 23 mg/dL   Creatinine, Ser 9.18 0.44 - 1.00 mg/dL   Calcium  8.7 (L) 8.9 - 10.3 mg/dL   GFR, Estimated >39 >39 mL/min   Anion gap 10 5 - 15  Urinalysis, Routine w reflex microscopic -Urine, Clean Catch     Status: Abnormal   Collection Time: 11/17/23 12:33  PM  Result Value Ref Range   Color, Urine YELLOW YELLOW   APPearance CLEAR CLEAR   Specific Gravity, Urine >1.046 (H) 1.005 - 1.030   pH 5.0 5.0 - 8.0   Glucose, UA NEGATIVE NEGATIVE mg/dL   Hgb urine dipstick NEGATIVE NEGATIVE   Bilirubin Urine NEGATIVE NEGATIVE   Ketones, ur NEGATIVE NEGATIVE mg/dL   Protein, ur 30 (A) NEGATIVE mg/dL   Nitrite NEGATIVE NEGATIVE   Leukocytes,Ua NEGATIVE NEGATIVE   RBC / HPF 0-5 0 - 5 RBC/hpf   WBC, UA 0-5 0 - 5 WBC/hpf   Bacteria, UA RARE (A) NONE SEEN   Squamous Epithelial / HPF 0-5 0 - 5 /HPF   Mucus PRESENT   CBC     Status: Abnormal   Collection Time: 11/18/23  6:49 AM  Result Value Ref Range   WBC 9.8 4.0 - 10.5 K/uL   RBC 2.21 (L) 3.87 - 5.11 MIL/uL   Hemoglobin 7.8 (L) 12.0 - 15.0 g/dL   HCT 76.5 (L) 63.9 - 53.9 %   MCV 105.9 (H) 80.0 - 100.0 fL   MCH 35.3 (H) 26.0 - 34.0 pg   MCHC 33.3 30.0 - 36.0 g/dL   RDW 83.4 (H) 88.4 - 84.4 %   Platelets 143 (L) 150 - 400 K/uL   nRBC 0.0 0.0 - 0.2 %  Basic metabolic panel     Status: Abnormal   Collection Time: 11/18/23  6:49 AM  Result Value Ref Range   Sodium 135 135 - 145 mmol/L   Potassium 4.3 3.5 - 5.1 mmol/L   Chloride 104 98 - 111 mmol/L   CO2 23 22 - 32 mmol/L   Glucose, Bld 100 (H) 70 - 99 mg/dL   BUN 25 (H) 8 - 23 mg/dL   Creatinine, Ser 9.10 0.44 - 1.00 mg/dL   Calcium  8.5 (L) 8.9 - 10.3 mg/dL   GFR, Estimated >39 >39 mL/min   Anion gap 8 5 - 15    Assessment & Plan: Present on Admission:  Pelvic fracture (HCC)    LOS: 1 day   Additional comments:I reviewed the patient's new clinical lab test results. / Fall  R superior and inferior pubic rami fx - ortho c/s,Dr. Elsa ENGEL, PT/OT Pelvic hematoma with active extrav - IR c/s, Dr. Johann, S/P AE PAF - home lopressor  FEN - regular diet VTE - SCDs, hold chemical ppx due to bleeding concerns Dispo - to 4NP, PT/OT, currently at [facility], likely rehab there at D/C I spoke with her daughter at the  bedside.  Critical Care Total Time*: 34 Minutes  Dann Hummer, MD, MPH, FACS Trauma & General Surgery Use [email or web address] to contact on call provider  11/18/2023  *Care during  the described time interval was provided by me. I have reviewed this patient's available data, including medical history, events of note, physical examination and test results as part of my evaluation.

## 2023-11-18 NOTE — NC FL2 (Signed)
 [place]  MEDICAID FL2 LEVEL OF CARE FORM     IDENTIFICATION  Patient Name: Kathy Velez Birthdate: 06-Sep-1931 Sex: female Admission Date (Current Location): 11/16/2023  [facility] and [place] Number:  Producer, television/film/video and Address:  [facility], [address], [place], [place] [address]      Provider Number: [id number]  Attending Physician Name and Address:  Md, Trauma, MD  Relative Name and Phone Number:  Willo Ruth (Daughter)  [phone] (Mobile)    Current Level of Care: Hospital Recommended Level of Care: Skilled Nursing Facility Prior Approval Number:    Date Approved/Denied:   PASRR Number: [id number]  Discharge Plan:      Current Diagnoses: Patient Active Problem List   Diagnosis Date Noted   Pelvic fracture (HCC) 11/17/2023   Chronic heart failure (HCC) 10/19/2023   Hypercholesterolemia 04/17/2023   History of colon cancer 03/08/2022   Diverticulosis of large intestine with hemorrhage 03/08/2022   GIB (gastrointestinal bleeding) 03/06/2022   Closed bimalleolar fracture of right ankle 02/02/2022   Scalp hematoma 12/06/2021   GERD without esophagitis 12/06/2021   Frequent falls 12/06/2021   Hyponatremia 12/06/2021   Aortic atherosclerosis (HCC) 07/26/2021   Vascular dementia without behavioral disturbance (HCC) 04/25/2021   Fecal incontinence 04/25/2021   Colon cancer (HCC) 04/25/2021   Psychophysiological insomnia 10/11/2019   DNR (do not resuscitate) 10/11/2019   Transient ischemic attack (TIA) 03/09/2019   Pain in right knee 02/12/2018   Osteopenia of forearm 12/11/2017   Primary osteoarthritis of both knees 12/11/2017   Permanent atrial fibrillation (HCC) 11/22/2017   Hypercoagulable state due to atrial fibrillation (HCC) 10/11/2017   A-fib (HCC) 10/11/2017   Diplopia 10/11/2017   Presbycusis of both ears 10/11/2017   Acquired hammertoes of both feet 10/11/2017   Macrocytosis without anemia 10/11/2017   Genu  valgum, right 10/11/2017   History of multiple strokes 10/11/2017   Mild cognitive impairment with memory loss 10/11/2017   Overactive bladder 10/11/2017   Mixed hyperlipidemia 09/03/2017   History of colon cancer, stage III 01/17/2017   Carotid stenosis 12/13/2016   Chronic seasonal allergic rhinitis 06/06/2016   Interstitial lung disease (HCC) 12/28/2015   Hemispheric carotid artery syndrome 07/18/2015   Hypothyroidism 05/05/2015   Rheumatoid arthritis (HCC) 05/05/2015   Constipation 05/05/2015   Vitamin B12 deficiency 05/05/2015   Iron  deficiency anemia 05/02/2015   Atrial flutter (HCC) 04/13/2015   Status post mitral valve repair 04/13/2015   Status post tricuspid valve repair 04/13/2015   Spinal stenosis of lumbar region 02/02/2015   RLS (restless legs syndrome) 11/10/2013   Essential hypertension 07/14/2013   Depression 07/02/2011    Orientation RESPIRATION BLADDER Height & Weight     Self, Situation  O2 (2L) Indwelling catheter Weight: 128 lb 1.4 oz (58.1 kg) Height:     BEHAVIORAL SYMPTOMS/MOOD NEUROLOGICAL BOWEL NUTRITION STATUS        Diet (reg)  AMBULATORY STATUS COMMUNICATION OF NEEDS Skin     Verbally Normal                       Personal Care Assistance Level of Assistance  Bathing, Dressing, Feeding Bathing Assistance: Maximum assistance Feeding assistance: Limited assistance Dressing Assistance: Maximum assistance     Functional Limitations Info  Hearing   Hearing Info: Impaired (hearing aid)      SPECIAL CARE FACTORS FREQUENCY  PT (By licensed PT), OT (By licensed OT)     PT Frequency: 5 times per week OT  Frequency: 5 times per week            Contractures      Additional Factors Info  Code Status, Allergies Code Status Info: Do not attempt resuscitation (DNR) PRE-ARREST INTERVENTIONS DESIRED Allergies Info: Claritin (Loratadine), Codeine, Gabapentin , Molds & Smuts, Pollen Extract, Bee Venom, Wasp Venom           Current  Medications (11/18/2023):  This is the current hospital active medication list Current Facility-Administered Medications  Medication Dose Route Frequency Provider Last Rate Last Admin   acetaminophen  (TYLENOL ) tablet 1,000 mg  1,000 mg Oral Q6H Paola Dreama SAILOR, MD   1,000 mg at 11/18/23 0548   Chlorhexidine  Gluconate Cloth 2 % PADS 6 each  6 each Topical Daily Paola Dreama SAILOR, MD   6 each at 11/17/23 1040   docusate sodium  (COLACE) capsule 100 mg  100 mg Oral BID Paola Dreama SAILOR, MD   100 mg at 11/17/23 2211   hydrALAZINE  (APRESOLINE ) injection 10 mg  10 mg Intravenous Q2H PRN Paola Dreama SAILOR, MD       HYDROcodone -acetaminophen  (NORCO/VICODIN) 5-325 MG per tablet 1-2 tablet  1-2 tablet Oral Q4H PRN Johann Sieving, MD   2 tablet at 11/18/23 0840   irbesartan  (AVAPRO ) tablet 75 mg  75 mg Oral Daily Kinsinger, Herlene Righter, MD   75 mg at 11/18/23 1043   methocarbamol  (ROBAXIN ) tablet 500 mg  500 mg Oral Q8H Paola Dreama SAILOR, MD   500 mg at 11/18/23 9451   metoprolol  tartrate (LOPRESSOR ) injection 5 mg  5 mg Intravenous Q6H PRN Paola Dreama SAILOR, MD       metoprolol  tartrate (LOPRESSOR ) tablet 75 mg  75 mg Oral BID Kinsinger, Herlene Righter, MD   75 mg at 11/18/23 1043   morphine  (PF) 2 MG/ML injection 1-2 mg  1-2 mg Intravenous Q6H PRN Paola Dreama SAILOR, MD   2 mg at 11/17/23 8062   ondansetron  (ZOFRAN -ODT) disintegrating tablet 4 mg  4 mg Oral Q6H PRN Paola Dreama SAILOR, MD       Or   ondansetron  (ZOFRAN ) injection 4 mg  4 mg Intravenous Q6H PRN Paola Dreama SAILOR, MD   4 mg at 11/17/23 1741   Oral care mouth rinse  15 mL Mouth Rinse PRN Paola Dreama SAILOR, MD       oxyCODONE  (Oxy IR/ROXICODONE ) immediate release tablet 2.5-5 mg  2.5-5 mg Oral Q4H PRN Paola Dreama SAILOR, MD   5 mg at 11/17/23 2211   polyethylene glycol (MIRALAX  / GLYCOLAX ) packet 17 g  17 g Oral Daily PRN Paola Dreama SAILOR, MD         Discharge Medications: Please see discharge summary for a list of discharge medications.  Relevant Imaging  Results:  Relevant Lab Results:   Additional Information SS #: [id number]  Cammeron Greis E Ketih Goodie, LCSW

## 2023-11-18 NOTE — Evaluation (Signed)
 Physical Therapy Evaluation Patient Details Name: Kathy Velez MRN: [id number] DOB: 1931-09-09 Today's Date: 11/18/2023  History of Present Illness  89 y.o. female  for unwitnessed fall while at SNF, report of downtime. R superior and inferior pubic rami fx with pelvic hematoma (on Eliquis  for afib) s/p extravasation PMH- rheumatoid arthritis, afib on Eliquis , colon Ca, diplopia, HLD, HTN, seizures, TIA, dementia  Clinical Impression   Pt admitted secondary to problem above with deficits below. PTA patient was long-term resident of [name]. She could walk modified independent/supervision with rollator hallway distances. For longer distances she used her wheelchair. Pt currently requires mod- +2 max assist for bed mobility and sit to stand. She was unable to take pivotal steps around to chair on her left with use of RW or bil HHA. Anticipate patient will benefit from PT to address problems listed below. Will continue to follow acutely to maximize functional mobility, independence, and safety.  Patient will benefit from continued inpatient follow up therapy, <3 hours/day          If plan is discharge home, recommend the following: Two people to help with walking and/or transfers;Direct supervision/assist for medications management;Direct supervision/assist for financial management;Assist for transportation;Help with stairs or ramp for entrance;Supervision due to cognitive status   Can travel by private vehicle   No    Equipment Recommendations None recommended by PT  Recommendations for Other Services       Functional Status Assessment Patient has had a recent decline in their functional status and demonstrates the ability to make significant improvements in function in a reasonable and predictable amount of time.     Precautions / Restrictions Precautions Precautions: Fall Restrictions Weight Bearing Restrictions Per Provider Order: Yes RLE Weight Bearing Per Provider Order:  Weight bearing as tolerated LLE Weight Bearing Per Provider Order: Weight bearing as tolerated      Mobility  Bed Mobility Overal bed mobility: Needs Assistance Bed Mobility: Supine to Sit, Sit to Supine     Supine to sit: Mod assist, HOB elevated Sit to supine: Max assist, +2 for physical assistance   General bed mobility comments: pt required assist to move RLE over Left EOB, to raise torso, and to scoot hips out to EOB; return to supine assist to control torso and lift legs onto bed    Transfers Overall transfer level: Needs assistance Equipment used: Rolling walker (2 wheels), 2 person hand held assist Transfers: Sit to/from Stand Sit to Stand: Mod assist, +2 physical assistance           General transfer comment: able to stand with RW with good bil UE support, however could not unweight LLE to take a step; removed RW with +2 HHA and still unable to get pt to wt-shift over RLE enough to allow LLE to step to chair    Ambulation/Gait               General Gait Details: unable  Stairs            Wheelchair Mobility     Tilt Bed    Modified Rankin (Stroke Patients Only)       Balance Overall balance assessment: Needs assistance, History of Falls Sitting-balance support: No upper extremity supported, Feet supported Sitting balance-Leahy Scale: Fair     Standing balance support: Bilateral upper extremity supported Standing balance-Leahy Scale: Poor  Pertinent Vitals/Pain Pain Assessment Pain Assessment: Faces Faces Pain Scale: Hurts even more Pain Location: Rt pelvis Pain Descriptors / Indicators: Discomfort, Guarding Pain Intervention(s): Limited activity within patient's tolerance, Monitored during session, Premedicated before session, Repositioned    Home Living Family/patient expects to be discharged to:: Skilled nursing facility                   Additional Comments: from LTC per  daughter; will need PT on return    Prior Function Prior Level of Function : Needs assist             Mobility Comments: used rollator for walking to dining room; longer distances used wheelchair due to fatigue, dyspnea       Extremity/Trunk Assessment   Upper Extremity Assessment Upper Extremity Assessment: Defer to OT evaluation    Lower Extremity Assessment Lower Extremity Assessment: RLE deficits/detail;Generalized weakness RLE Deficits / Details: limited AROM in supine due to pain; able to tolerate 90 hip/knee flexion in sitting EOB    Cervical / Trunk Assessment Cervical / Trunk Assessment: Kyphotic  Communication   Communication Communication: No apparent difficulties    Cognition Arousal: Alert Behavior During Therapy: Flat affect   PT - Cognitive impairments: History of cognitive impairments                       PT - Cognition Comments: oriented to place, situation Following commands: Impaired Following commands impaired: Follows one step commands with increased time     Cueing Cueing Techniques: Verbal cues, Tactile cues     General Comments General comments (skin integrity, edema, etc.): Daughter present. Removed 1.5L O2 with sats dropping to 86% and resumed 1.5L with sats >90% throughout    Exercises     Assessment/Plan    PT Assessment Patient needs continued PT services  PT Problem List Decreased strength;Decreased range of motion;Decreased activity tolerance;Decreased balance;Decreased mobility;Decreased cognition;Decreased knowledge of use of DME;Cardiopulmonary status limiting activity       PT Treatment Interventions DME instruction;Gait training;Functional mobility training;Therapeutic activities;Therapeutic exercise;Balance training;Cognitive remediation;Patient/family education    PT Goals (Current goals can be found in the Care Plan section)  Acute Rehab PT Goals Patient Stated Goal: return to [name] PT Goal  Formulation: With patient/family Time For Goal Achievement: 12/02/23 Potential to Achieve Goals: Good    Frequency Min 2X/week     Co-evaluation PT/OT/SLP Co-Evaluation/Treatment: Yes Reason for Co-Treatment: Complexity of the patient's impairments (multi-system involvement);Necessary to address cognition/behavior during functional activity;To address functional/ADL transfers;For patient/therapist safety PT goals addressed during session: Mobility/safety with mobility;Proper use of DME OT goals addressed during session: ADL's and self-care;Strengthening/ROM       AM-PAC PT 6 Clicks Mobility  Outcome Measure Help needed turning from your back to your side while in a flat bed without using bedrails?: A Lot Help needed moving from lying on your back to sitting on the side of a flat bed without using bedrails?: A Lot Help needed moving to and from a bed to a chair (including a wheelchair)?: Total Help needed standing up from a chair using your arms (e.g., wheelchair or bedside chair)?: Total Help needed to walk in hospital room?: Total Help needed climbing 3-5 steps with a railing? : Total 6 Click Score: 8    End of Session Equipment Utilized During Treatment: Gait belt Activity Tolerance: Patient limited by pain Patient left: in bed;with call bell/phone within reach;with nursing/sitter in room;with family/visitor present Nurse Communication: Mobility status;Need for lift  equipment (likely could use stedy well) PT Visit Diagnosis: Other abnormalities of gait and mobility (R26.89);History of falling (Z91.81);Difficulty in walking, not elsewhere classified (R26.2)    Time: 9072-9044 PT Time Calculation (min) (ACUTE ONLY): 28 min   Charges:   PT Evaluation $PT Eval Low Complexity: 1 Low   PT General Charges $$ ACUTE PT VISIT: 1 Visit          Macario RAMAN, PT Acute Rehabilitation Services  Office [phone]   Macario SHAUNNA Soja 11/18/2023, 11:06 AM

## 2023-11-19 LAB — CBC
HCT: 22.4 % — ABNORMAL LOW (ref 36.0–46.0)
Hemoglobin: 7.3 g/dL — ABNORMAL LOW (ref 12.0–15.0)
MCH: 34.3 pg — ABNORMAL HIGH (ref 26.0–34.0)
MCHC: 32.6 g/dL (ref 30.0–36.0)
MCV: 105.2 fL — ABNORMAL HIGH (ref 80.0–100.0)
Platelets: 138 K/uL — ABNORMAL LOW (ref 150–400)
RBC: 2.13 MIL/uL — ABNORMAL LOW (ref 3.87–5.11)
RDW: 16.5 % — ABNORMAL HIGH (ref 11.5–15.5)
WBC: 9.9 K/uL (ref 4.0–10.5)
nRBC: 0 % (ref 0.0–0.2)

## 2023-11-19 LAB — BASIC METABOLIC PANEL WITH GFR
Anion gap: 9 (ref 5–15)
BUN: 35 mg/dL — ABNORMAL HIGH (ref 8–23)
CO2: 22 mmol/L (ref 22–32)
Calcium: 8.6 mg/dL — ABNORMAL LOW (ref 8.9–10.3)
Chloride: 100 mmol/L (ref 98–111)
Creatinine, Ser: 0.98 mg/dL (ref 0.44–1.00)
GFR, Estimated: 55 mL/min — ABNORMAL LOW (ref >60)
Glucose, Bld: 123 mg/dL — ABNORMAL HIGH (ref 70–99)
Potassium: 4.1 mmol/L (ref 3.5–5.1)
Sodium: 131 mmol/L — ABNORMAL LOW (ref 135–145)

## 2023-11-19 MED ORDER — FUROSEMIDE 20 MG PO TABS
20.0000 mg | ORAL_TABLET | Freq: Once | ORAL | Status: AC
  Administered 2023-11-19: 20 mg via ORAL
  Filled 2023-11-19: qty 1

## 2023-11-19 MED ORDER — SENNA 8.6 MG PO TABS
2.0000 | ORAL_TABLET | Freq: Every day | ORAL | Status: DC
  Administered 2023-11-19: 17.2 mg via ORAL
  Filled 2023-11-19: qty 2

## 2023-11-19 MED ORDER — LEVOTHYROXINE SODIUM 88 MCG PO TABS
88.0000 ug | ORAL_TABLET | Freq: Every day | ORAL | Status: DC
  Administered 2023-11-19 – 2023-11-20 (×2): 88 ug via ORAL
  Filled 2023-11-19 (×2): qty 1

## 2023-11-19 NOTE — Progress Notes (Signed)
 Patient ID: Shanette Tamargo, female   DOB: 1931-08-03, 89 y.o.   MRN: [id number]      Subjective: Nausea this AM, ate lunch yesterday but did not like dinner No BM ROS negative except as listed above. Objective: Vital signs in last 24 hours: Temp:  [97.7 F (36.5 C)-98.7 F (37.1 C)] 98.5 F (36.9 C) (08/26 0807) Pulse Rate:  [82-116] 94 (08/26 0807) Resp:  [16-27] 20 (08/26 0807) BP: (92-166)/(56-96) 166/94 (08/26 0807) SpO2:  [94 %-100 %] 97 % (08/26 0807) Last BM Date :  (PTA)  Intake/Output from previous day: 08/25 0701 - 08/26 0700 In: 830 [P.O.:830] Out: 450 [Urine:450] Intake/Output this shift: No intake/output data recorded.  General appearance: alert and cooperative Resp: clear to auscultation bilaterally GI: soft, NT Ext: calves soft  Lab Results: CBC  Recent Labs    11/18/23 1242 11/19/23 0512  WBC 10.2 9.9  HGB 7.6* 7.3*  HCT 22.7* 22.4*  PLT 128* 138*   BMET Recent Labs    11/18/23 0649 11/19/23 0512  NA 135 131*  K 4.3 4.1  CL 104 100  CO2 23 22  GLUCOSE 100* 123*  BUN 25* 35*  CREATININE 0.89 0.98  CALCIUM  8.5* 8.6*   PT/INR Recent Labs    11/16/23 2210  LABPROT 15.4*  INR 1.2   ABG No results for input(s): PHART, HCO3 in the last 72 hours.  Invalid input(s): PCO2, PO2  Studies/Results: No results found.  Anti-infectives: Anti-infectives (From admission, onward)    None       Assessment/Plan: Fall  R superior and inferior pubic rami fx - ortho c/s,Dr. Elsa ENGEL, PT/OT Pelvic hematoma with active extrav - IR c/s, Dr. Johann, S/P AE PAF - home lopressor  ABL anemia FEN - regular diet, add scheduled senna, hyponatremia - lasix  20mg  PO x 1 VTE - SCDs, hold chemical ppx due to bleeding concerns Dispo - to 4NP, PT/OT, currently at [facility], likely rehab there at D/C possibly tomorrow I spoke with her daughter at the bedside.    LOS: 2 days    Dann Hummer, MD, MPH, FACS Trauma & General Surgery Use  [email or web address] to contact on call provider  11/19/2023

## 2023-11-19 NOTE — Plan of Care (Signed)
 Problem: Education: Goal: Knowledge of General Education information will improve Description: Including pain rating scale, medication(s)/side effects and non-pharmacologic comfort measures 11/19/2023 0128 by Marvis Kenneth SAILOR, RN Outcome: Progressing 11/19/2023 0038 by Marvis Kenneth SAILOR, RN Outcome: Progressing   Problem: Health Behavior/Discharge Planning: Goal: Ability to manage health-related needs will improve 11/19/2023 0128 by Marvis Kenneth SAILOR, RN Outcome: Progressing 11/19/2023 0038 by Marvis Kenneth SAILOR, RN Outcome: Progressing   Problem: Clinical Measurements: Goal: Ability to maintain clinical measurements within normal limits will improve 11/19/2023 0128 by Marvis Kenneth SAILOR, RN Outcome: Progressing 11/19/2023 0038 by Marvis Kenneth SAILOR, RN Outcome: Progressing Goal: Will remain free from infection 11/19/2023 0128 by Marvis Kenneth SAILOR, RN Outcome: Progressing 11/19/2023 0038 by Marvis Kenneth SAILOR, RN Outcome: Progressing Goal: Diagnostic test results will improve 11/19/2023 0128 by Marvis Kenneth SAILOR, RN Outcome: Progressing 11/19/2023 0038 by Marvis Kenneth SAILOR, RN Outcome: Progressing Goal: Respiratory complications will improve 11/19/2023 0128 by Marvis Kenneth SAILOR, RN Outcome: Progressing 11/19/2023 0038 by Marvis Kenneth SAILOR, RN Outcome: Progressing Goal: Cardiovascular complication will be avoided 11/19/2023 0128 by Marvis Kenneth SAILOR, RN Outcome: Progressing 11/19/2023 0038 by Marvis Kenneth SAILOR, RN Outcome: Progressing   Problem: Activity: Goal: Risk for activity intolerance will decrease 11/19/2023 0128 by Marvis Kenneth SAILOR, RN Outcome: Progressing 11/19/2023 0038 by Marvis Kenneth SAILOR, RN Outcome: Progressing   Problem: Nutrition: Goal: Adequate nutrition will be maintained 11/19/2023 0128 by Marvis Kenneth SAILOR, RN Outcome: Progressing 11/19/2023 0038 by Marvis Kenneth SAILOR, RN Outcome: Progressing   Problem: Coping: Goal: Level of anxiety will  decrease 11/19/2023 0128 by Marvis Kenneth SAILOR, RN Outcome: Progressing 11/19/2023 0038 by Marvis Kenneth SAILOR, RN Outcome: Progressing   Problem: Elimination: Goal: Will not experience complications related to bowel motility 11/19/2023 0128 by Marvis Kenneth SAILOR, RN Outcome: Progressing 11/19/2023 0038 by Marvis Kenneth SAILOR, RN Outcome: Progressing Goal: Will not experience complications related to urinary retention 11/19/2023 0128 by Marvis Kenneth SAILOR, RN Outcome: Progressing 11/19/2023 0038 by Marvis Kenneth SAILOR, RN Outcome: Progressing   Problem: Pain Managment: Goal: General experience of comfort will improve and/or be controlled 11/19/2023 0128 by Marvis Kenneth SAILOR, RN Outcome: Progressing 11/19/2023 0038 by Marvis Kenneth SAILOR, RN Outcome: Progressing   Problem: Safety: Goal: Ability to remain free from injury will improve 11/19/2023 0128 by Marvis Kenneth SAILOR, RN Outcome: Progressing 11/19/2023 0038 by Marvis Kenneth SAILOR, RN Outcome: Progressing   Problem: Skin Integrity: Goal: Risk for impaired skin integrity will decrease 11/19/2023 0128 by Marvis Kenneth SAILOR, RN Outcome: Progressing 11/19/2023 0038 by Marvis Kenneth SAILOR, RN Outcome: Progressing   Problem: Education: Goal: Understanding of CV disease, CV risk reduction, and recovery process will improve 11/19/2023 0128 by Marvis Kenneth SAILOR, RN Outcome: Progressing 11/19/2023 0038 by Marvis Kenneth SAILOR, RN Outcome: Progressing Goal: Individualized Educational Video(s) 11/19/2023 0128 by Marvis Kenneth SAILOR, RN Outcome: Progressing 11/19/2023 0038 by Marvis Kenneth SAILOR, RN Outcome: Progressing   Problem: Activity: Goal: Ability to return to baseline activity level will improve 11/19/2023 0128 by Marvis Kenneth SAILOR, RN Outcome: Progressing 11/19/2023 0038 by Marvis Kenneth SAILOR, RN Outcome: Progressing   Problem: Cardiovascular: Goal: Ability to achieve and maintain adequate cardiovascular perfusion will  improve 11/19/2023 0128 by Marvis Kenneth SAILOR, RN Outcome: Progressing 11/19/2023 0038 by Marvis Kenneth SAILOR, RN Outcome: Progressing Goal: Vascular access site(s) Level 0-1 will be maintained 11/19/2023 0128 by Marvis Kenneth SAILOR, RN Outcome: Progressing 11/19/2023 0038 by Marvis Kenneth SAILOR, RN Outcome: Progressing   Problem: Health Behavior/Discharge Planning: Goal: Ability to safely manage  health-related needs after discharge will improve 11/19/2023 0128 by Marvis Kenneth SAILOR, RN Outcome: Progressing 11/19/2023 0038 by Marvis Kenneth SAILOR, RN Outcome: Progressing

## 2023-11-19 NOTE — Progress Notes (Signed)
 Physical Therapy Treatment Patient Details Name: Kathy Velez MRN: [id number] DOB: 10-09-1931 Today's Date: 11/19/2023   History of Present Illness 89 y.o. female  for unwitnessed fall while at SNF, report of downtime. R superior and inferior pubic rami fx with pelvic hematoma (on Eliquis  for afib) s/p extravasation PMH- rheumatoid arthritis, afib on Eliquis , colon Ca, diplopia, HLD, HTN, seizures, TIA, dementia    PT Comments  Patient with less pain and better mobility of RLE this date. STill unable to put enough weight on RLE to allow her to advance LLE, therefore used stedy for transfer bed to chair with +2 min assist. Performed LE exercises with some assist needed for RLE.     If plan is discharge home, recommend the following: Two people to help with walking and/or transfers;Direct supervision/assist for medications management;Direct supervision/assist for financial management;Assist for transportation;Help with stairs or ramp for entrance;Supervision due to cognitive status   Can travel by private vehicle     No  Equipment Recommendations  None recommended by PT    Recommendations for Other Services       Precautions / Restrictions Precautions Precautions: Fall Precaution/Restrictions Comments: has knee brace for R knee -- R knee buckles at baseline, watch O2 Restrictions Weight Bearing Restrictions Per Provider Order: Yes RLE Weight Bearing Per Provider Order: Weight bearing as tolerated LLE Weight Bearing Per Provider Order: Weight bearing as tolerated     Mobility  Bed Mobility Overal bed mobility: Needs Assistance Bed Mobility: Supine to Sit     Supine to sit: Mod assist, HOB elevated     General bed mobility comments: pt required assist to move RLE over Rt EOB, to raise torso, and to scoot hips out to EOB    Transfers Overall transfer level: Needs assistance   Transfers: Sit to/from Stand Sit to Stand: +2 physical assistance, Via lift equipment, Min  assist           General transfer comment: stood from slightly elevated bed (to accomodate stedy frame) and then returned to sitting on stedy seat; transported in front of recliner and pt stood with CGA from higher seat and sat in recliner with CGA. Transfer via Lift Equipment: Stedy  Ambulation/Gait               General Gait Details: unable   Optometrist     Tilt Bed    Modified Rankin (Stroke Patients Only)       Balance Overall balance assessment: Needs assistance, History of Falls Sitting-balance support: No upper extremity supported, Feet supported Sitting balance-Leahy Scale: Fair     Standing balance support: Bilateral upper extremity supported Standing balance-Leahy Scale: Poor                              Communication Communication Communication: No apparent difficulties  Cognition Arousal: Alert Behavior During Therapy: Flat affect   PT - Cognitive impairments: History of cognitive impairments                       PT - Cognition Comments: oriented to place, situation Following commands: Impaired Following commands impaired: Follows one step commands with increased time    Cueing Cueing Techniques: Verbal cues, Tactile cues  Exercises General Exercises - Lower Extremity Ankle Circles/Pumps: AROM, Both, 10 reps Long Arc Quad: AROM, Both, 10 reps Hip ABduction/ADduction: AROM,  Left, AAROM, Right, 5 reps    General Comments        Pertinent Vitals/Pain Pain Assessment Pain Assessment: Faces Faces Pain Scale: Hurts a little bit Pain Location: Rt pelvis Pain Descriptors / Indicators: Discomfort, Guarding Pain Intervention(s): Limited activity within patient's tolerance, Monitored during session, Premedicated before session    Home Living                          Prior Function            PT Goals (current goals can now be found in the care plan section) Acute Rehab  PT Goals Patient Stated Goal: return to [name] Time For Goal Achievement: 12/02/23 Potential to Achieve Goals: Good Progress towards PT goals: Progressing toward goals    Frequency    Min 2X/week      PT Plan      Co-evaluation              AM-PAC PT 6 Clicks Mobility   Outcome Measure  Help needed turning from your back to your side while in a flat bed without using bedrails?: A Lot Help needed moving from lying on your back to sitting on the side of a flat bed without using bedrails?: A Lot Help needed moving to and from a bed to a chair (including a wheelchair)?: Total Help needed standing up from a chair using your arms (e.g., wheelchair or bedside chair)?: Total Help needed to walk in hospital room?: Total Help needed climbing 3-5 steps with a railing? : Total 6 Click Score: 8    End of Session Equipment Utilized During Treatment: Gait belt Activity Tolerance: Patient limited by pain Patient left: with call bell/phone within reach;in chair;with chair alarm set Nurse Communication: Mobility status;Need for lift equipment (stedy) PT Visit Diagnosis: Other abnormalities of gait and mobility (R26.89);History of falling (Z91.81);Difficulty in walking, not elsewhere classified (R26.2)     Time: 8590-8562 PT Time Calculation (min) (ACUTE ONLY): 28 min  Charges:    $Gait Training: 8-22 mins $Therapeutic Exercise: 8-22 mins PT General Charges $$ ACUTE PT VISIT: 1 Visit                      Kathy Velez, PT Acute Rehabilitation Services  Office [phone]    Kathy Velez 11/19/2023, 2:56 PM

## 2023-11-20 ENCOUNTER — Encounter: Payer: Self-pay | Admitting: Cardiovascular Disease

## 2023-11-20 LAB — BASIC METABOLIC PANEL WITH GFR
Anion gap: 12 (ref 5–15)
BUN: 30 mg/dL — ABNORMAL HIGH (ref 8–23)
CO2: 22 mmol/L (ref 22–32)
Calcium: 8.2 mg/dL — ABNORMAL LOW (ref 8.9–10.3)
Chloride: 97 mmol/L — ABNORMAL LOW (ref 98–111)
Creatinine, Ser: 0.88 mg/dL (ref 0.44–1.00)
GFR, Estimated: >60 mL/min (ref >60)
Glucose, Bld: 96 mg/dL (ref 70–99)
Potassium: 3.8 mmol/L (ref 3.5–5.1)
Sodium: 131 mmol/L — ABNORMAL LOW (ref 135–145)

## 2023-11-20 LAB — CBC
HCT: 22.1 % — ABNORMAL LOW (ref 36.0–46.0)
Hemoglobin: 7.3 g/dL — ABNORMAL LOW (ref 12.0–15.0)
MCH: 34.9 pg — ABNORMAL HIGH (ref 26.0–34.0)
MCHC: 33 g/dL (ref 30.0–36.0)
MCV: 105.7 fL — ABNORMAL HIGH (ref 80.0–100.0)
Platelets: 142 K/uL — ABNORMAL LOW (ref 150–400)
RBC: 2.09 MIL/uL — ABNORMAL LOW (ref 3.87–5.11)
RDW: 16.2 % — ABNORMAL HIGH (ref 11.5–15.5)
WBC: 8.6 K/uL (ref 4.0–10.5)
nRBC: 0 % (ref 0.0–0.2)

## 2023-11-20 MED ORDER — METHOCARBAMOL 500 MG PO TABS: 500.0000 mg | ORAL_TABLET | Freq: Three times a day (TID) | ORAL | Status: DC | PRN

## 2023-11-20 MED ORDER — DOCUSATE SODIUM 100 MG PO CAPS: 100.0000 mg | ORAL_CAPSULE | Freq: Every day | ORAL | Status: DC | PRN

## 2023-11-20 MED ORDER — DOCUSATE SODIUM 100 MG PO CAPS: 100.0000 mg | ORAL_CAPSULE | Freq: Every day | ORAL | Status: DC

## 2023-11-20 MED ORDER — ACETAMINOPHEN 500 MG PO TABS: 1000.0000 mg | ORAL_TABLET | Freq: Four times a day (QID) | ORAL | Status: AC | PRN

## 2023-11-20 MED ORDER — POLYETHYLENE GLYCOL 3350 17 G PO PACK: 17.0000 g | PACK | Freq: Every day | ORAL | Status: DC | PRN

## 2023-11-20 NOTE — Telephone Encounter (Signed)
 Yes, I dropped by to see her in the hospital. Had a fall and we decided to stop Eliquis . Can she please see me or Rollo in a month or two or three?

## 2023-11-20 NOTE — Discharge Summary (Signed)
 Physician Discharge Summary  Patient ID: Kathy Velez MRN: [id number] DOB/AGE: [birth year] 89 y.o.  Admit date: 11/16/2023 Discharge date: 11/20/2023  Discharge Diagnoses Fall  Right superior and inferior pubic rami fractures Pelvic hematoma with active extravasation  ABL anemia, stable   Consultants IR Orthopedic surgery Cardiology    Procedures Angioembolization of right pelvic vessel - Dr. Toribio Faes (11/17/23)  HPI: Patient is a 89 year old female who presented as a non-leveled trauma for unwitnessed fall while at SNF. She lives in the skilled living portion of [name]. Reportedly 30 min of down time. She takes Eliquis  for A. Fib. Work up in the ED revealed pelvic fractures with hematoma and active extravasation. IR was consulted for angioembolization which was done as listed above. Patient admitted to trauma ICU.  Hospital Course: Orthopedic surgery also consulted for pelvic fractures and recommended non-operative management and WBAT. Patient also discussed with her usual cardiologist and decision made to not resume Eliquis  until she follows up in the office. He may ultimately decide to discontinue Eliquis  permanently. Patient evaluated by therapies and recommended for SNF. Hemoglobin monitored and was stable at 7.3 on day of discharge. Recommend checking CBC in about 1 week from discharge. Discussed with patient and daughter at bedside. Medically stable for discharge back to SNF on 11/20/23. Follow up as outlined below.   Exam: General: pleasant, WD, WN female who is laying in bed in NAD HEENT: head is normocephalic, atraumatic.  Sclera are noninjected. EOMI Heart: regular, rate, and rhythm. Palpable radial and pedal pulses bilaterally Lungs: CTAB, no wheezes, rhonchi, or rales noted.  Respiratory effort nonlabored Abd: soft, NT, ND, +BS, no masses, hernias, or organomegaly MS: all 4 extremities are symmetrical with no cyanosis, clubbing, or edema.    Allergies as of  11/20/2023       Reactions   Claritin [loratadine] Other (See Comments)   Listed on MAR Unknown reaction   Codeine Other (See Comments)   just don't take it well   Gabapentin  Other (See Comments)   Dizziness    Molds & Smuts Other (See Comments)   Dust.  Reaction is not listed on MAR    Pollen Extract Swelling   Grass   Bee Venom Swelling, Rash   Wasp Venom Swelling, Rash        Medication List     STOP taking these medications    apixaban  2.5 MG Tabs tablet Commonly known as: ELIQUIS        TAKE these medications    acetaminophen  500 MG tablet Commonly known as: TYLENOL  Take 2 tablets (1,000 mg total) by mouth every 6 (six) hours as needed for mild pain (pain score 1-3) or fever. What changed:  when to take this reasons to take this   amoxicillin 500 MG capsule Commonly known as: AMOXIL Take 2,000 mg by mouth as needed.   atorvastatin  20 MG tablet Commonly known as: LIPITOR Take by mouth daily.   cloNIDine  0.1 MG tablet Commonly known as: CATAPRES  Take 1 tablet (0.1 mg total) by mouth daily as needed. If SBP >180 only   cyanocobalamin  1000 MCG tablet Commonly known as: VITAMIN B12 Take 1,000 mcg by mouth in the morning.   docusate sodium  100 MG capsule Commonly known as: COLACE Take 1 capsule (100 mg total) by mouth daily as needed for mild constipation.   Enbrel SureClick 50 MG/ML injection Generic drug: etanercept Inject 50 mg into the skin once a week.   furosemide  20 MG tablet Commonly known as: LASIX   Take 1 tablet (20 mg total) by mouth daily for 2 days.   furosemide  20 MG tablet Commonly known as: LASIX  Take 1 tablet (20 mg total) by mouth every other day. Can take an additional does of lasix  for a weight gain 2-3 lbs overnight or increased swelling of the legs.   Iron  (Ferrous Sulfate ) 325 (65 Fe) MG Tabs Take 325 mg by mouth daily.   leflunomide  20 MG tablet Commonly known as: ARAVA  Take 20 mg by mouth in the morning.    levalbuterol  0.63 MG/3ML nebulizer solution Commonly known as: Xopenex  Take 3 mLs (0.63 mg total) by nebulization every 6 (six) hours as needed for wheezing or shortness of breath.   levothyroxine  88 MCG tablet Commonly known as: SYNTHROID  Take 88 mcg by mouth daily before breakfast.   melatonin 5 MG Tabs Take 5 mg by mouth at bedtime.   methocarbamol  500 MG tablet Commonly known as: ROBAXIN  Take 1 tablet (500 mg total) by mouth every 8 (eight) hours as needed for muscle spasms (musculoskeletal pain).   metoprolol  tartrate 100 MG tablet Commonly known as: LOPRESSOR  Take 150 mg by mouth daily. If heart rate <50 or SBP >180 or <100   mirtazapine  15 MG tablet Commonly known as: REMERON  Take 0.5 tablets (7.5 mg total) by mouth at bedtime. Take 7.5 mg each night for two weeks then discontinue   mirtazapine  7.5 MG tablet Commonly known as: REMERON  Take 7.5 mg by mouth at bedtime.   MULTIVITAMIN ADULT PO Take 1 tablet by mouth every morning.   omeprazole  40 MG capsule Commonly known as: PRILOSEC TAKE 1 CAPSULE DAILY   polyethylene glycol 17 g packet Commonly known as: MIRALAX  / GLYCOLAX  Take 17 g by mouth daily as needed for moderate constipation (constipation).   PreserVision AREDS 2 Caps Take 1 capsule by mouth in the morning and at bedtime.   valsartan  160 MG tablet Commonly known as: DIOVAN  Take 160 mg by mouth every evening.   Vitamin D  50 MCG (2000 UT) Caps Take 2,000 Units by mouth in the morning.          Follow-up Information     Charlanne Fredia CROME, MD Follow up.   Specialty: Internal Medicine Contact information: [address] [place] [address] [phone]         Elsa Lonni SAUNDERS, MD. Call.   Specialty: Orthopedic Surgery Why: To arrange follow up for pelvic fractures Contact information: [address] [place] [address] [phone]         Francyne Headland, MD. Call.   Specialty: Cardiology Why: to follow up for  cardiology and decision on whether to resume eliquis  Contact information: [address] [place] [address] [phone]                 Signed: Burnard SAUNDERS Louder , [facility] Surgery 11/20/2023, 8:41 AM Please see Amion for pager number during day hours 7:00am-4:30pm

## 2023-11-20 NOTE — Care Management Important Message (Signed)
 Important Message  Patient Details  Name: Kathy Velez MRN: [id number] Date of Birth: 1932-02-12   Important Message Given:  Yes - Medicare IM     Jon Cruel 11/20/2023, 4:34 PM

## 2023-11-20 NOTE — TOC Transition Note (Addendum)
 Transition of Care [facility]) - Discharge Note   Patient Details  Name: Kathy Velez MRN: [id number] Date of Birth: [birth year]  Transition of Care [facility]) CM/SW Contact:  Samamtha Tiegs E Shanyia Stines, LCSW Phone Number: 11/20/2023, 9:03 AM   Clinical Narrative:    Discharge to [facility] today. Room 129. Confirmed with Social Worker [place]. Updated MD, RN, and daughter Adin. Asked RN to call report. EMS paperwork completed. [facility] called for transport.   Final next level of care: Skilled Nursing Facility Barriers to Discharge: Barriers Resolved   Patient Goals and CMS Choice   CMS Medicare.gov Compare Post Acute Care list provided to:: Patient Represenative (must comment) Choice offered to / list presented to : Adult Children      Discharge Placement              Patient chooses bed at: [facility] Patient to be transferred to facility by: [facility] Name of family member notified: [name] Patient and family notified of of transfer: 11/20/23  Discharge Plan and Services Additional resources added to the After Visit Summary for                                       Social Drivers of Health (SDOH) Interventions SDOH Screenings   Food Insecurity: No Food Insecurity (11/18/2023)  Housing: Unknown (11/18/2023)  Transportation Needs: No Transportation Needs (11/18/2023)  Utilities: Not At Risk (11/18/2023)  Alcohol Screen: Low Risk  (04/16/2018)  Depression (PHQ2-9): Low Risk  (10/23/2023)  Financial Resource Strain: Low Risk  (01/28/2018)  Physical Activity: Sufficiently Active (01/28/2018)  Social Connections: Unknown (11/18/2023)  Stress: No Stress Concern Present (01/28/2018)  Tobacco Use: Low Risk  (11/16/2023)     Readmission Risk Interventions     No data to display

## 2023-11-20 NOTE — Progress Notes (Signed)
 Report given to [facility] staff.  Patient's AVS printed and handed to [facility]. Patient's belongings accounted for and returned to daughter. Patient dressed in personal clothing and left unit with [facility].

## 2023-11-21 ENCOUNTER — Non-Acute Institutional Stay (SKILLED_NURSING_FACILITY): Payer: Self-pay | Admitting: Adult Health

## 2023-11-21 ENCOUNTER — Encounter: Payer: Self-pay | Admitting: Adult Health

## 2023-11-21 DIAGNOSIS — S3282XD Multiple fractures of pelvis without disruption of pelvic ring, subsequent encounter for fracture with routine healing: Secondary | ICD-10-CM | POA: Diagnosis not present

## 2023-11-21 DIAGNOSIS — M25551 Pain in right hip: Secondary | ICD-10-CM | POA: Diagnosis not present

## 2023-11-21 DIAGNOSIS — M06049 Rheumatoid arthritis without rheumatoid factor, unspecified hand: Secondary | ICD-10-CM | POA: Diagnosis not present

## 2023-11-21 DIAGNOSIS — M25552 Pain in left hip: Secondary | ICD-10-CM | POA: Diagnosis not present

## 2023-11-21 DIAGNOSIS — I5042 Chronic combined systolic (congestive) and diastolic (congestive) heart failure: Secondary | ICD-10-CM

## 2023-11-21 DIAGNOSIS — T148XXA Other injury of unspecified body region, initial encounter: Secondary | ICD-10-CM | POA: Diagnosis not present

## 2023-11-21 DIAGNOSIS — D509 Iron deficiency anemia, unspecified: Secondary | ICD-10-CM

## 2023-11-21 DIAGNOSIS — Z85038 Personal history of other malignant neoplasm of large intestine: Secondary | ICD-10-CM

## 2023-11-21 DIAGNOSIS — J849 Interstitial pulmonary disease, unspecified: Secondary | ICD-10-CM | POA: Diagnosis not present

## 2023-11-21 DIAGNOSIS — Z9181 History of falling: Secondary | ICD-10-CM | POA: Diagnosis not present

## 2023-11-21 DIAGNOSIS — R2681 Unsteadiness on feet: Secondary | ICD-10-CM | POA: Diagnosis not present

## 2023-11-21 DIAGNOSIS — I10 Essential (primary) hypertension: Secondary | ICD-10-CM | POA: Diagnosis not present

## 2023-11-21 DIAGNOSIS — I4821 Permanent atrial fibrillation: Secondary | ICD-10-CM

## 2023-11-21 DIAGNOSIS — R41841 Cognitive communication deficit: Secondary | ICD-10-CM | POA: Diagnosis not present

## 2023-11-21 DIAGNOSIS — F0150 Vascular dementia without behavioral disturbance: Secondary | ICD-10-CM | POA: Diagnosis not present

## 2023-11-21 DIAGNOSIS — E782 Mixed hyperlipidemia: Secondary | ICD-10-CM | POA: Diagnosis not present

## 2023-11-21 DIAGNOSIS — M6281 Muscle weakness (generalized): Secondary | ICD-10-CM | POA: Diagnosis not present

## 2023-11-21 DIAGNOSIS — R2689 Other abnormalities of gait and mobility: Secondary | ICD-10-CM | POA: Diagnosis not present

## 2023-11-21 DIAGNOSIS — S32810S Multiple fractures of pelvis with stable disruption of pelvic ring, sequela: Secondary | ICD-10-CM | POA: Diagnosis not present

## 2023-11-21 NOTE — Progress Notes (Signed)
 Location:  [facility] Room Number: 129 P Place of Service:  SNF ([phone]) Provider:  Tawni America, NP    Patient Care Team: Kathy Fredia CROME, MD as PCP - General (Internal Medicine) Croitoru, Jerel, MD as PCP - Cardiology (Cardiology) [name], DO as Consulting Physician (Neurology)  Extended Emergency Contact Information Primary Emergency Contact: Velez,Kathy Address: [address]          [place], [place] [address] [place] Home Phone: [phone] Mobile Phone: [phone] Relation: Daughter Secondary Emergency Contact: Kathy Velez Address: [address]          [place], [place] [address] [place] Phone: [phone] Relation: Son  Code Status:  DNR Goals of care: Advanced Directive information    11/18/2023    2:44 PM  Advanced Directives  Would patient like information on creating a medical advance directive? No - Patient declined     Chief Complaint  Patient presents with   Hospitalization Follow-up    HPI:  The patient is an 89 year old with atrial fibrillation and congestive heart failure who presents with a recent fall resulting in pelvic fractures and a pelvic hematoma.  Recent fall and pelvic trauma - Sustained a fall on November 16, 2023, resulting in right superior and inferior pubic rami fractures and a pelvic hematoma with active extravasation. - Hospitalized from August 23 to November 20, 2023, for management of pelvic injuries. - Underwent angioembolism of the right pelvic vessel on November 17, 2023. - Recent CT scan of the chest, abdomen, and pelvis revealed a pelvic hematoma measuring 9.4 x 6.7 x 9.4 cm with active bleeding. -voided this am without difficulty   Anticoagulation management - Anticoagulation therapy with Eliquis  was discontinued during hospitalization for management of pelvic hematoma and active bleeding.  Dyspnea and oxygen requirement - Mild shortness of breath,  chronic with exertion and currently present at rest. - Requires two liters of supplemental oxygen with oxygen saturation of 99%.  Congestive heart failure - History of congestive heart failure with reduced ejection fraction of 45-50% as of September 24, 2023. - Managed with Lasix .  ILD - Chronic interstitial lung disease and emphysema. - Pulmonary hypertension. - Recent CT scan showed consolidation in the posterior right middle lobe, a 6 mm left upper lobe nodule, a 5 mm left lower lobe nodule, and small airway impactions. -no cough or sputum production  -prior treatment of PNA 6/30   Cognitive impairment - Underlying dementia. - Resides in a skilled care area.  Rheumatologic disease - Rheumatoid arthritis managed with Arava  and Enbrel.  Anemia - On iron  supplementation for anemia. - Hemoglobin stable at 7.3 upon discharge.  History of malignancy - History of colon adenocarcinoma. - Last CEA level was 6.51 on Aug 13, 2023. - Followed by oncology.  Thyroid  dysfunction - Hypothyroidism with TSH of 2.92 in June 2025. - Continues thyroid  supplementation.  Hyperlipidemia Off lipitor per cardiology to decrease pill burden and LDL was 41  Other imaging findings - CT of the head showed no acute abnormalities but noted degenerative changes in the cervical spine. - Recent CT scan revealed aortic atherosclerosis and a small hiatal hernia.  Past Medical History:  Diagnosis Date   Allergy    seasonal   Anemia    Anxiety    Arthritis    Back   Asymptomatic menopausal state    Atrial fibrillation (HCC)    Cancer (HCC) 2006   Colon.  Basal Cell Skin cancer-  right arm   Cataract    removed bilateral   Chronic atrial fibrillation (HCC)    Colon cancer (HCC)    Constipation due to pain medication therapy    after heart surgery   Deficiency of other specified B group vitamins    Diplopia    Disorientation, unspecified    Dysrhythmia    PAF   GERD (gastroesophageal reflux  disease)    Heart murmur    Hyperlipidemia    Hypertension    Hypothyroidism    Malignant neoplasm of colon, unspecified (HCC)    Other specified diseases of blood and blood-forming organs    Other specified disorders of bone density and structure, unspecified forearm    Other thrombophilia (HCC)    per matrix   Overactive bladder    per Matrix   Personal history of other malignant neoplasm of large intestine    Presbycusis, bilateral    per matrix   RA (rheumatoid arthritis) (HCC)    Restless leg    Seizures (HCC)    after Heart Surgey   Stroke (HCC)    TIA- found by neurologist after    Valgus deformity, not elsewhere classified, right knee    per matrix   Vascular dementia, mild, without behavioral disturbance, psychotic disturbance, mood disturbance, and anxiety (HCC)    per matrix   Past Surgical History:  Procedure Laterality Date   ABDOMINAL HYSTERECTOMY  1970   Partial    COLON RESECTION  2006   cancer   COLON SURGERY     COLONOSCOPY     EYE SURGERY Bilateral    Cataract   IR ANGIOGRAM PELVIS SELECTIVE OR SUPRASELECTIVE  11/17/2023   IR ANGIOGRAM SELECTIVE EACH ADDITIONAL VESSEL  11/17/2023   IR EMBO ART  VEN HEMORR LYMPH EXTRAV  INC GUIDE ROADMAPPING  11/17/2023   IR US  GUIDE VASC ACCESS RIGHT  11/17/2023   MAXIMUM ACCESS (MAS)POSTERIOR LUMBAR INTERBODY FUSION (PLIF) 2 LEVEL N/A 04/12/2015   Procedure: Lumbar Three-Five Decompression, Pedicle Screw Fixation, and Posteriolateral Arthrodesis;  Surgeon: Fairy Levels, MD;  Location: [facility];  Service: Neurosurgery;  Laterality: N/A;  L3-4 L4-5 Maximum access posterior lumbar fusion, possible interbodies and resection of synovial cyst at L4-5   MITRAL VALVE REPAIR  01/20/2013   Gore-tex cords to P1, P2, and P3. Magic suture to posterior medial commisure, #30 Physio 1 ring. Done in [place]    TONSILLECTOMY     about 1940   TRICUSPID VALVE SURGERY  01/20/2013   #28 TriAd ring done in [place]     Allergies  Allergen  Reactions   Claritin [Loratadine] Other (See Comments)    Listed on MAR Unknown reaction   Codeine Other (See Comments)    just don't take it well   Gabapentin  Other (See Comments)    Dizziness    Molds & Smuts Other (See Comments)    Dust.  Reaction is not listed on MAR    Pollen Extract Swelling    Grass   Bee Venom Swelling and Rash   Wasp Venom Swelling and Rash    Outpatient Encounter Medications as of 11/21/2023  Medication Sig   acetaminophen  (TYLENOL ) 500 MG tablet Take 2 tablets (1,000 mg total) by mouth every 6 (six) hours as needed for mild pain (pain score 1-3) or fever.   amoxicillin (AMOXIL) 500 MG capsule Take 2,000 mg by mouth as needed.   atorvastatin  (LIPITOR) 20 MG tablet Take by mouth daily.   Cholecalciferol  (VITAMIN D ) 50 MCG (2000  UT) CAPS Take 2,000 Units by mouth in the morning.   cloNIDine  (CATAPRES ) 0.1 MG tablet Take 1 tablet (0.1 mg total) by mouth daily as needed. If SBP >180 only   docusate sodium  (COLACE) 100 MG capsule Take 1 capsule (100 mg total) by mouth daily.   ENBREL SURECLICK 50 MG/ML injection Inject 50 mg into the skin once a week.   furosemide  (LASIX ) 20 MG tablet Take 1 tablet (20 mg total) by mouth every other day. Can take an additional does of lasix  for a weight gain 2-3 lbs overnight or increased swelling of the legs.   furosemide  (LASIX ) 20 MG tablet Take 1 tablet (20 mg total) by mouth daily for 2 days.   Iron , Ferrous Sulfate , 325 (65 Fe) MG TABS Take 325 mg by mouth daily.   leflunomide  (ARAVA ) 20 MG tablet Take 20 mg by mouth in the morning.   levalbuterol  (XOPENEX ) 0.63 MG/3ML nebulizer solution Take 3 mLs (0.63 mg total) by nebulization every 6 (six) hours as needed for wheezing or shortness of breath.   levothyroxine  (SYNTHROID ) 88 MCG tablet Take 88 mcg by mouth daily before breakfast.   melatonin 5 MG TABS Take 5 mg by mouth at bedtime.   methocarbamol  (ROBAXIN ) 500 MG tablet Take 1 tablet (500 mg total) by mouth every 8  (eight) hours as needed for muscle spasms (musculoskeletal pain).   metoprolol  tartrate (LOPRESSOR ) 100 MG tablet Take 150 mg by mouth daily. If heart rate <50 or SBP >180 or <100   mirtazapine  (REMERON ) 15 MG tablet Take 0.5 tablets (7.5 mg total) by mouth at bedtime. Take 7.5 mg each night for two weeks then discontinue   Multiple Vitamin (MULTIVITAMIN ADULT PO) Take 1 tablet by mouth every morning.   Multiple Vitamins-Minerals (PRESERVISION AREDS 2) CAPS Take 1 capsule by mouth in the morning and at bedtime.   omeprazole  (PRILOSEC) 40 MG capsule TAKE 1 CAPSULE DAILY   polyethylene glycol (MIRALAX  / GLYCOLAX ) 17 g packet Take 17 g by mouth daily as needed for moderate constipation (constipation).   valsartan  (DIOVAN ) 160 MG tablet Take 160 mg by mouth every evening.   vitamin B-12 (CYANOCOBALAMIN ) 1000 MCG tablet Take 1,000 mcg by mouth in the morning.   mirtazapine  (REMERON ) 7.5 MG tablet Take 7.5 mg by mouth at bedtime. (Patient not taking: Reported on 11/21/2023)   No facility-administered encounter medications on file as of 11/21/2023.    Review of Systems  Constitutional:  Positive for activity change. Negative for appetite change, chills, diaphoresis, fatigue, fever and unexpected weight change.  HENT:  Negative for congestion.   Respiratory:  Negative for cough, shortness of breath and wheezing.   Cardiovascular:  Negative for chest pain, palpitations and leg swelling.  Gastrointestinal:  Negative for abdominal distention, abdominal pain, constipation and diarrhea.  Genitourinary:  Negative for difficulty urinating and dysuria.  Musculoskeletal:  Positive for arthralgias and gait problem. Negative for back pain, joint swelling and myalgias.  Neurological:  Negative for dizziness, tremors, seizures, syncope, facial asymmetry, speech difficulty, weakness, light-headedness, numbness and headaches.  Psychiatric/Behavioral:  Negative for agitation, behavioral problems and confusion.      Immunization History  Administered Date(s) Administered   Fluad Quad(high Dose 65+) 01/05/2019, 12/07/2021, 01/15/2023   Fluad Trivalent(High Dose 65+) 01/15/2023   H1N1 04/07/2008   INFLUENZA, HIGH DOSE SEASONAL PF 01/15/2012, 12/23/2014, 01/22/2020   Influenza Whole 12/29/2008, 12/22/2009   Influenza,inj,Quad PF,6+ Mos 01/22/2014, 12/10/2016, 01/17/2018   Influenza,trivalent, recombinat, inj, PF 01/05/2016   Influenza-Unspecified 04/07/2008,  12/15/2014, 01/13/2021   Moderna Covid-19 Vaccine  Bivalent Booster 68yrs & up 01/29/2022, 01/15/2023   Moderna Sars-Covid-2 Vaccination 04/07/2019, 05/05/2019, 02/09/2020   PFIZER(Purple Top)SARS-COV-2 Vaccination 01/06/2021   PPD Test 07/17/2010, 04/19/2015, 04/25/2015   Pfizer Covid-19 Vaccine Bivalent Booster 43yrs & up 07/19/2022   Pneumococcal Conjugate-13 11/10/2013   Pneumococcal Polysaccharide-23 12/27/2010, 12/05/2016   RSV,unspecified 03/28/2022   Tdap 04/15/2017   Zoster Recombinant(Shingrix) 04/09/2017, 06/18/2017   Pertinent  Health Maintenance Due  Topic Date Due   INFLUENZA VACCINE  10/25/2023   DEXA SCAN  Completed      07/09/2022    1:44 PM 08/29/2022    3:31 PM 04/18/2023   12:52 PM 10/04/2023   10:46 AM 10/23/2023    2:10 PM  Fall Risk  Falls in the past year? 0 1 0 0 0  Was there an injury with Fall? 0 1 0 0 0  Fall Risk Category Calculator 0 2 0 0 0  Patient at Risk for Falls Due to History of fall(s);Impaired balance/gait;Impaired mobility   History of fall(s) History of fall(s);Impaired balance/gait  Fall risk Follow up Falls evaluation completed Falls evaluation completed Falls evaluation completed Falls evaluation completed Falls evaluation completed;Education provided   Functional Status Survey:    Vitals:   11/21/23 0951  BP: 124/74  Pulse: 77  Resp: 17  Temp: (!) 97 F (36.1 C)  SpO2: 94%  Weight: 126 lb 14.4 oz (57.6 kg)  Height: 5' 2 (1.575 m)   Body mass index is 23.21 kg/m. Physical  Exam Vitals reviewed.  Constitutional:      Appearance: Normal appearance.  Cardiovascular:     Rate and Rhythm: Normal rate. Rhythm irregular.     Heart sounds: Murmur heard.  Pulmonary:     Effort: Pulmonary effort is normal. No respiratory distress.     Breath sounds: No wheezing.     Comments: Decreased bases Abdominal:     General: Bowel sounds are normal. There is distension (mild soft).     Palpations: Abdomen is soft.     Tenderness: There is no abdominal tenderness. There is no guarding.  Musculoskeletal:     Right lower leg: No edema.     Left lower leg: No edema.  Skin:    General: Skin is warm and dry.     Coloration: Skin is pale.  Neurological:     General: No focal deficit present.     Mental Status: She is alert. Mental status is at baseline.     Labs reviewed: Recent Labs    11/18/23 0649 11/19/23 0512 11/20/23 0426  NA 135 131* 131*  K 4.3 4.1 3.8  CL 104 100 97*  CO2 23 22 22   GLUCOSE 100* 123* 96  BUN 25* 35* 30*  CREATININE 0.89 0.98 0.88  CALCIUM  8.5* 8.6* 8.2*   Recent Labs    12/31/22 0000 02/05/23 1146 08/02/23 0000 11/16/23 2210  AST 26 18 25  44*  ALT 36* 19 22 42  ALKPHOS 104 91 118 104  BILITOT  --  0.8  --  0.8  PROT  --  7.1  --  6.6  ALBUMIN 4.1 4.1  --  3.6   Recent Labs    02/05/23 1146 04/04/23 0000 08/13/23 1130 11/16/23 2210 11/17/23 0350 11/18/23 1242 11/19/23 0512 11/20/23 0426  WBC 7.6   < > 7.6 10.5   < > 10.2 9.9 8.6  NEUTROABS 4.5  --  4.3 6.3  --   --   --   --  HGB 11.5*   < > 11.8* 10.6*   < > 7.6* 7.3* 7.3*  HCT 34.6*   < > 35.0* 32.1*   < > 22.7* 22.4* 22.1*  MCV 102.4*  --  103.6* 105.6*   < > 105.1* 105.2* 105.7*  PLT 163   < > 177 196   < > 128* 138* 142*   < > = values in this interval not displayed.   Lab Results  Component Value Date   TSH 2.92 09/13/2023   Lab Results  Component Value Date   HGBA1C 5.5 01/12/2013   Lab Results  Component Value Date   CHOL 114 08/02/2023   HDL 53  08/02/2023   LDLCALC 44 08/02/2023   TRIG 86 08/02/2023    Significant Diagnostic Results in last 30 days:  IR Angiogram Pelvis Selective Or Supraselective Result Date: 11/17/2023 INDICATION: Clemens, pelvic fracture with expanding hematoma and continued arterial extravasation noted on recent CT EXAM: ULTRASOUND GUIDANCE FOR VASCULAR ACCESS RIGHT INTERNAL ILIAC ARTERIOGRAPHY SUB SELECTIVE SECOND ORDER BRANCH EMBOLIZATION X2 MEDICATIONS: No periprocedural antibiotics were indicated ANESTHESIA/SEDATION: Intravenous Fentanyl  50mcg and Versed  1mg  were administered by RN during a total moderate (conscious) sedation time of 35 minutes; the patient's level of consciousness and physiological / cardiorespiratory status were monitored continuously by radiology RN under my direct supervision. PROCEDURE: Informed consent was obtained from the patient following explanation of the procedure, risks, benefits and alternatives. The patient understands, agrees and consents for the procedure. All questions were addressed. A time out was performed prior to the initiation of the procedure. Maximal barrier sterile technique utilized including caps, mask, sterile gowns, sterile gloves, large sterile drape, hand hygiene, and Betadine prep. Right common femoral artery accessed with a micropuncture set under real-time ultrasound guidance, exchanged for a 5 French vascular sheath, through which a 5 [place] Kumpe catheter was advanced and used to selectively catheterize the right internal iliac artery. Confirmatory arteriography was performed. A coaxial Renegade hi Flo microcatheter with a fathom 016 guidewire was advanced to selectively catheterize 2 branches of the anterior division right internal iliac artery for selective embolization using Gelfoam slurry, to stasis of flow into peripheral branches. Follow-up arteriography was performed each level. No evidence of nontarget embolization. The catheters and sheath were then removed and  hemostasis achieved with the aid of calc to device. The patient tolerated the procedure well. CONTRAST:  35 mL Omnipaque  300 IA FLUOROSCOPY: Radiation Exposure Index (as provided by the fluoroscopic device): 292 mGy Kerma COMPLICATIONS: None immediate. IMPRESSION: 1. Technically successful sub selective embolization of right internal iliac artery branches with temporary embolic (Gelfoam slurry) without immediate complication. Electronically Signed   By: JONETTA Faes M.D.   On: 11/17/2023 14:46   IR US  Guide Vasc Access Right Result Date: 11/17/2023 INDICATION: Clemens, pelvic fracture with expanding hematoma and continued arterial extravasation noted on recent CT EXAM: ULTRASOUND GUIDANCE FOR VASCULAR ACCESS RIGHT INTERNAL ILIAC ARTERIOGRAPHY SUB SELECTIVE SECOND ORDER BRANCH EMBOLIZATION X2 MEDICATIONS: No periprocedural antibiotics were indicated ANESTHESIA/SEDATION: Intravenous Fentanyl  50mcg and Versed  1mg  were administered by RN during a total moderate (conscious) sedation time of 35 minutes; the patient's level of consciousness and physiological / cardiorespiratory status were monitored continuously by radiology RN under my direct supervision. PROCEDURE: Informed consent was obtained from the patient following explanation of the procedure, risks, benefits and alternatives. The patient understands, agrees and consents for the procedure. All questions were addressed. A time out was performed prior to the initiation of the procedure. Maximal barrier sterile technique  utilized including caps, mask, sterile gowns, sterile gloves, large sterile drape, hand hygiene, and Betadine prep. Right common femoral artery accessed with a micropuncture set under real-time ultrasound guidance, exchanged for a 5 French vascular sheath, through which a 5 [place] Kumpe catheter was advanced and used to selectively catheterize the right internal iliac artery. Confirmatory arteriography was performed. A coaxial Renegade hi Flo  microcatheter with a fathom 016 guidewire was advanced to selectively catheterize 2 branches of the anterior division right internal iliac artery for selective embolization using Gelfoam slurry, to stasis of flow into peripheral branches. Follow-up arteriography was performed each level. No evidence of nontarget embolization. The catheters and sheath were then removed and hemostasis achieved with the aid of calc to device. The patient tolerated the procedure well. CONTRAST:  35 mL Omnipaque  300 IA FLUOROSCOPY: Radiation Exposure Index (as provided by the fluoroscopic device): 292 mGy Kerma COMPLICATIONS: None immediate. IMPRESSION: 1. Technically successful sub selective embolization of right internal iliac artery branches with temporary embolic (Gelfoam slurry) without immediate complication. Electronically Signed   By: JONETTA Faes M.D.   On: 11/17/2023 14:46   IR EMBO ART  VEN HEMORR LYMPH EXTRAV  INC GUIDE ROADMAPPING Result Date: 11/17/2023 INDICATION: Clemens, pelvic fracture with expanding hematoma and continued arterial extravasation noted on recent CT EXAM: ULTRASOUND GUIDANCE FOR VASCULAR ACCESS RIGHT INTERNAL ILIAC ARTERIOGRAPHY SUB SELECTIVE SECOND ORDER BRANCH EMBOLIZATION X2 MEDICATIONS: No periprocedural antibiotics were indicated ANESTHESIA/SEDATION: Intravenous Fentanyl  50mcg and Versed  1mg  were administered by RN during a total moderate (conscious) sedation time of 35 minutes; the patient's level of consciousness and physiological / cardiorespiratory status were monitored continuously by radiology RN under my direct supervision. PROCEDURE: Informed consent was obtained from the patient following explanation of the procedure, risks, benefits and alternatives. The patient understands, agrees and consents for the procedure. All questions were addressed. A time out was performed prior to the initiation of the procedure. Maximal barrier sterile technique utilized including caps, mask, sterile gowns,  sterile gloves, large sterile drape, hand hygiene, and Betadine prep. Right common femoral artery accessed with a micropuncture set under real-time ultrasound guidance, exchanged for a 5 French vascular sheath, through which a 5 [place] Kumpe catheter was advanced and used to selectively catheterize the right internal iliac artery. Confirmatory arteriography was performed. A coaxial Renegade hi Flo microcatheter with a fathom 016 guidewire was advanced to selectively catheterize 2 branches of the anterior division right internal iliac artery for selective embolization using Gelfoam slurry, to stasis of flow into peripheral branches. Follow-up arteriography was performed each level. No evidence of nontarget embolization. The catheters and sheath were then removed and hemostasis achieved with the aid of calc to device. The patient tolerated the procedure well. CONTRAST:  35 mL Omnipaque  300 IA FLUOROSCOPY: Radiation Exposure Index (as provided by the fluoroscopic device): 292 mGy Kerma COMPLICATIONS: None immediate. IMPRESSION: 1. Technically successful sub selective embolization of right internal iliac artery branches with temporary embolic (Gelfoam slurry) without immediate complication. Electronically Signed   By: JONETTA Faes M.D.   On: 11/17/2023 14:46   IR Angiogram Selective Each Additional Vessel Result Date: 11/17/2023 INDICATION: Clemens, pelvic fracture with expanding hematoma and continued arterial extravasation noted on recent CT EXAM: ULTRASOUND GUIDANCE FOR VASCULAR ACCESS RIGHT INTERNAL ILIAC ARTERIOGRAPHY SUB SELECTIVE SECOND ORDER BRANCH EMBOLIZATION X2 MEDICATIONS: No periprocedural antibiotics were indicated ANESTHESIA/SEDATION: Intravenous Fentanyl  50mcg and Versed  1mg  were administered by RN during a total moderate (conscious) sedation time of 35 minutes; the patient's level of consciousness and  physiological / cardiorespiratory status were monitored continuously by radiology RN under my direct  supervision. PROCEDURE: Informed consent was obtained from the patient following explanation of the procedure, risks, benefits and alternatives. The patient understands, agrees and consents for the procedure. All questions were addressed. A time out was performed prior to the initiation of the procedure. Maximal barrier sterile technique utilized including caps, mask, sterile gowns, sterile gloves, large sterile drape, hand hygiene, and Betadine prep. Right common femoral artery accessed with a micropuncture set under real-time ultrasound guidance, exchanged for a 5 French vascular sheath, through which a 5 [place] Kumpe catheter was advanced and used to selectively catheterize the right internal iliac artery. Confirmatory arteriography was performed. A coaxial Renegade hi Flo microcatheter with a fathom 016 guidewire was advanced to selectively catheterize 2 branches of the anterior division right internal iliac artery for selective embolization using Gelfoam slurry, to stasis of flow into peripheral branches. Follow-up arteriography was performed each level. No evidence of nontarget embolization. The catheters and sheath were then removed and hemostasis achieved with the aid of calc to device. The patient tolerated the procedure well. CONTRAST:  35 mL Omnipaque  300 IA FLUOROSCOPY: Radiation Exposure Index (as provided by the fluoroscopic device): 292 mGy Kerma COMPLICATIONS: None immediate. IMPRESSION: 1. Technically successful sub selective embolization of right internal iliac artery branches with temporary embolic (Gelfoam slurry) without immediate complication. Electronically Signed   By: JONETTA Faes M.D.   On: 11/17/2023 14:46   CT CHEST W CONTRAST Result Date: 11/17/2023 CLINICAL DATA:  Fall, with suspected acute comminuted right inferior pubic ramus fracture, concern is for urinary injury. Blunt polytrauma with potential for acute chest trauma as well. EXAM: CT CHEST WITH CONTRAST CT ABDOMEN AND PELVIS WITH  AND WITHOUT CONTRAST TECHNIQUE: Multidetector CT imaging of the chest was performed during intravenous contrast administration. Multidetector CT imaging of the abdomen and pelvis was performed prior to and following the standard protocol before and during bolus administration of intravenous contrast. RADIATION DOSE REDUCTION: This exam was performed according to the departmental dose-optimization program which includes automated exposure control, adjustment of the mA and/or kV according to patient size and/or use of iterative reconstruction technique. CONTRAST:  OMNIPAQUE  IOHEXOL  350 MG/ML SOLN COMPARISON:  Most recent chest x-ray was PA Lat chest 09/23/2023, before that chest radiograph 08/06/2022. Comparison is made with the chest, abdomen and pelvis CT with contrast 03/31/2021, abdomen and pelvis CT with IV contrast 03/06/2022. FINDINGS: CT CHEST FINDINGS Cardiovascular: The heart is slightly enlarged. Again noted are sternotomy sutures with prosthetic tricuspid and mitral valves. There is chronic left atrial enlargement and central venous prominence. Patchy three-vessel coronary artery calcification. Enlarged pulmonary trunk again measures 3.6 cm indicating arterial hypertension. There is no central embolus on this nontailored exam. There is atherosclerosis in the aorta and great vessels without aneurysm, stenosis or dissection. No pericardial effusion. Mediastinum/Nodes: Small small hiatal hernia. Unremarkable esophagus. Thyroid  gland is not seen and could be removed, ablated or extremely atrophic. There is no intrathoracic or axillary adenopathy. The trachea is ectatic but clear. There is tracheobronchial chondrocalcinosis. Lungs/Pleura: There is biapical symmetric pleuroparenchymal scarring, calcifications and bronchiolectasis. There is mild centrilobular emphysema in the upper lobes, and subpleural reticulation in the lung fields with an upper lobe predominance consistent with chronic interstitial lung  disease. There is increased consolidation posteriorly in the right middle lobe which is most likely either due to pneumonia or aspiration. There is increased posterior atelectasis in the basilar lower lobes. Interval new 6 mm  subpleural left upper lobe nodule on 5:55. There is a new pleural-based 5 mm left lower lobe nodule on 5:117. There is new branching nodularity in the posterolateral left base consistent with small airway impactions. Similar new branching nodularity right lower lobe on 5:112, anterior left upper lobe on 5:48 and in the lingula on 5:75. There is no pleural effusion.  No pneumothorax. Musculoskeletal: Osteopenia, mild kyphosis and degenerative change thoracic spine. No acute or significant osseous findings. The CT ABDOMEN AND PELVIS FINDINGS Hepatobiliary: No focal liver abnormality is seen. No gallstones, gallbladder wall thickening, or biliary dilatation. Pancreas: Unremarkable. No pancreatic ductal dilatation or surrounding inflammatory changes. Spleen: Normal in size without focal abnormality. Adrenals/Urinary Tract: No adrenal mass is seen. There is no renal mass enhancement. Stable 1 cm Bosniak 1 cyst anterior right kidney. Occasional subcentimeter Bosniak 2 cortical cysts of both kidneys are too small to characterize. There is cortical thinning in both kidneys. No urinary stone or obstruction. The bladder wall is unremarkable, but the bladder is displaced into the left hemipelvis and mildly compressed by a sizable right perivesical extraperitoneal hematoma. No contrast extravasation outside of the bladder is seen. Stomach/Bowel: Prior partial right hemicolectomy with primary ileocolic anastomosis. No dilatation or overt wall thickening. Advanced sigmoid diverticulosis without evidence of diverticulitis. Vascular/Lymphatic: Large hematoma in the right hemipelvis along side the bladder, measuring 9.4 x 6.7 x 9.4 cm with active bleeding into the hematoma and scattered contrast levels on the  delayed exam. On the initial arterial phase images there is curvilinear contrast within the collection, but it could not be traced back to a specific vessel. Additional free hemoperitoneum collects above the bladder dome to the left and right. Reproductive: Status post hysterectomy. No adnexal masses. Other: Free hemorrhage above the bladder and hematoma to the right of the bladder as above. No free air. Musculoskeletal: Comminuted fracture, not significantly displaced involving medial aspect of the right superior pubic ramus and the mid to medial aspect of the right inferior pubic ramus, with swelling in the adjacent right obturator internus and externus. There is osteopenia, degenerative and postsurgical change of the spine. No other regional skeletal fracture is evident. IMPRESSION: 1. Hematoma in the right hemipelvis along side the bladder, measuring 9.4 x 6.7 x 9.4 cm, with active bleeding into the hematoma and scattered contrast levels on the delayed exam. The active bleeding could not be traced back to a specific vessel. 2. Comminuted fractures of the right superior and inferior pubic rami. 3. No urinary bladder extravasation is seen. 4. No other acute trauma related findings in the chest, abdomen or pelvis. 5. Chronic interstitial lung disease with increased consolidation in the posterior right middle lobe which could be due to pneumonia or aspiration. 6. New 6 mm left upper lobe and 5 mm left lower lobe nodules. Follow-up as indicated taking into account age and life expectancy. 7. Scattered peripheral branching nodularity new from prior study consistent with small airway impactions. 8. Aortic and coronary artery atherosclerosis. 9. Cardiomegaly with chronic left atrial enlargement and central venous prominence. Tricuspid and mitral prosthesis. 10. Enlarged pulmonary trunk indicating arterial hypertension. 11. Small hiatal hernia. 12. Advanced sigmoid diverticulosis. 13. Osteopenia and degenerative change.  14. Emphysema. 15. Critical Value/emergent results were called by telephone at the time of interpretation on 11/17/2023 at 4:09 am to provider [facility] , who verbally acknowledged these results. Aortic Atherosclerosis (ICD10-I70.0) and Emphysema (ICD10-J43.9). Electronically Signed   By: Francis Quam M.D.   On: 11/17/2023 04:17   CT  ABDOMEN PELVIS W WO CONTRAST Result Date: 11/17/2023 CLINICAL DATA:  Fall, with suspected acute comminuted right inferior pubic ramus fracture, concern is for urinary injury. Blunt polytrauma with potential for acute chest trauma as well. EXAM: CT CHEST WITH CONTRAST CT ABDOMEN AND PELVIS WITH AND WITHOUT CONTRAST TECHNIQUE: Multidetector CT imaging of the chest was performed during intravenous contrast administration. Multidetector CT imaging of the abdomen and pelvis was performed prior to and following the standard protocol before and during bolus administration of intravenous contrast. RADIATION DOSE REDUCTION: This exam was performed according to the departmental dose-optimization program which includes automated exposure control, adjustment of the mA and/or kV according to patient size and/or use of iterative reconstruction technique. CONTRAST:  OMNIPAQUE  IOHEXOL  350 MG/ML SOLN COMPARISON:  Most recent chest x-ray was PA Lat chest 09/23/2023, before that chest radiograph 08/06/2022. Comparison is made with the chest, abdomen and pelvis CT with contrast 03/31/2021, abdomen and pelvis CT with IV contrast 03/06/2022. FINDINGS: CT CHEST FINDINGS Cardiovascular: The heart is slightly enlarged. Again noted are sternotomy sutures with prosthetic tricuspid and mitral valves. There is chronic left atrial enlargement and central venous prominence. Patchy three-vessel coronary artery calcification. Enlarged pulmonary trunk again measures 3.6 cm indicating arterial hypertension. There is no central embolus on this nontailored exam. There is atherosclerosis in the aorta and great  vessels without aneurysm, stenosis or dissection. No pericardial effusion. Mediastinum/Nodes: Small small hiatal hernia. Unremarkable esophagus. Thyroid  gland is not seen and could be removed, ablated or extremely atrophic. There is no intrathoracic or axillary adenopathy. The trachea is ectatic but clear. There is tracheobronchial chondrocalcinosis. Lungs/Pleura: There is biapical symmetric pleuroparenchymal scarring, calcifications and bronchiolectasis. There is mild centrilobular emphysema in the upper lobes, and subpleural reticulation in the lung fields with an upper lobe predominance consistent with chronic interstitial lung disease. There is increased consolidation posteriorly in the right middle lobe which is most likely either due to pneumonia or aspiration. There is increased posterior atelectasis in the basilar lower lobes. Interval new 6 mm subpleural left upper lobe nodule on 5:55. There is a new pleural-based 5 mm left lower lobe nodule on 5:117. There is new branching nodularity in the posterolateral left base consistent with small airway impactions. Similar new branching nodularity right lower lobe on 5:112, anterior left upper lobe on 5:48 and in the lingula on 5:75. There is no pleural effusion.  No pneumothorax. Musculoskeletal: Osteopenia, mild kyphosis and degenerative change thoracic spine. No acute or significant osseous findings. The CT ABDOMEN AND PELVIS FINDINGS Hepatobiliary: No focal liver abnormality is seen. No gallstones, gallbladder wall thickening, or biliary dilatation. Pancreas: Unremarkable. No pancreatic ductal dilatation or surrounding inflammatory changes. Spleen: Normal in size without focal abnormality. Adrenals/Urinary Tract: No adrenal mass is seen. There is no renal mass enhancement. Stable 1 cm Bosniak 1 cyst anterior right kidney. Occasional subcentimeter Bosniak 2 cortical cysts of both kidneys are too small to characterize. There is cortical thinning in both kidneys.  No urinary stone or obstruction. The bladder wall is unremarkable, but the bladder is displaced into the left hemipelvis and mildly compressed by a sizable right perivesical extraperitoneal hematoma. No contrast extravasation outside of the bladder is seen. Stomach/Bowel: Prior partial right hemicolectomy with primary ileocolic anastomosis. No dilatation or overt wall thickening. Advanced sigmoid diverticulosis without evidence of diverticulitis. Vascular/Lymphatic: Large hematoma in the right hemipelvis along side the bladder, measuring 9.4 x 6.7 x 9.4 cm with active bleeding into the hematoma and scattered contrast levels on the delayed exam.  On the initial arterial phase images there is curvilinear contrast within the collection, but it could not be traced back to a specific vessel. Additional free hemoperitoneum collects above the bladder dome to the left and right. Reproductive: Status post hysterectomy. No adnexal masses. Other: Free hemorrhage above the bladder and hematoma to the right of the bladder as above. No free air. Musculoskeletal: Comminuted fracture, not significantly displaced involving medial aspect of the right superior pubic ramus and the mid to medial aspect of the right inferior pubic ramus, with swelling in the adjacent right obturator internus and externus. There is osteopenia, degenerative and postsurgical change of the spine. No other regional skeletal fracture is evident. IMPRESSION: 1. Hematoma in the right hemipelvis along side the bladder, measuring 9.4 x 6.7 x 9.4 cm, with active bleeding into the hematoma and scattered contrast levels on the delayed exam. The active bleeding could not be traced back to a specific vessel. 2. Comminuted fractures of the right superior and inferior pubic rami. 3. No urinary bladder extravasation is seen. 4. No other acute trauma related findings in the chest, abdomen or pelvis. 5. Chronic interstitial lung disease with increased consolidation in the  posterior right middle lobe which could be due to pneumonia or aspiration. 6. New 6 mm left upper lobe and 5 mm left lower lobe nodules. Follow-up as indicated taking into account age and life expectancy. 7. Scattered peripheral branching nodularity new from prior study consistent with small airway impactions. 8. Aortic and coronary artery atherosclerosis. 9. Cardiomegaly with chronic left atrial enlargement and central venous prominence. Tricuspid and mitral prosthesis. 10. Enlarged pulmonary trunk indicating arterial hypertension. 11. Small hiatal hernia. 12. Advanced sigmoid diverticulosis. 13. Osteopenia and degenerative change. 14. Emphysema. 15. Critical Value/emergent results were called by telephone at the time of interpretation on 11/17/2023 at 4:09 am to provider [facility] , who verbally acknowledged these results. Aortic Atherosclerosis (ICD10-I70.0) and Emphysema (ICD10-J43.9). Electronically Signed   By: Francis Quam M.D.   On: 11/17/2023 04:17   CT PELVIS WO CONTRAST Result Date: 11/17/2023 CLINICAL DATA:  Hip trauma, fracture suspected, xray done EXAM: CT PELVIS WITHOUT CONTRAST TECHNIQUE: Multidetector CT imaging of the pelvis was performed following the standard protocol without intravenous contrast. RADIATION DOSE REDUCTION: This exam was performed according to the departmental dose-optimization program which includes automated exposure control, adjustment of the mA and/or kV according to patient size and/or use of iterative reconstruction technique. COMPARISON:  X-ray right hip 11/16/2023 FINDINGS: Urinary Tract:  Irregular right urinary bladder wall thick. Bowel: Colonic diverticulosis. Unremarkable visualized pelvic bowel loops. Vascular/Lymphatic: No pathologically enlarged lymph nodes. No significant vascular abnormality seen. Reproductive:  No mass or other significant abnormality Other: Large volume right space of Rezius high density free fluid. No intraperitoneal free gas. No  organized fluid collection. Musculoskeletal: Acute comminuted and displaced right inferior pubic rami and superior pubic rami/pubic symphysis fracture. Acute nondisplaced right iliac fracture (9:22; 4:50). Asymmetric and irregular right internal and external obturator musculature. IMPRESSION: 1. Acute comminuted and displaced right inferior pubic rami and superior pubic rami/pubic symphysis fracture. 2. Acute nondisplaced right iliac fracture. 3. Large volume right extraperitoneal hematoma with likely associated extraperitoneal urinary bladder injury. Intraperitoneal urinary bladder injury not excluded. Recommend CT urogram. 4. Right internal and external obturator intramuscular hematoma. Electronically Signed   By: Morgane  Naveau M.D.   On: 11/17/2023 00:53   CT Thoracic Spine Wo Contrast Result Date: 11/17/2023 CLINICAL DATA:  Back trauma, no prior imaging (Age >= 16y) EXAM: CT  THORACIC AND LUMBAR SPINE WITHOUT CONTRAST TECHNIQUE: Multidetector CT imaging of the thoracic and lumbar spine was performed without contrast. Multiplanar CT image reconstructions were also generated. RADIATION DOSE REDUCTION: This exam was performed according to the departmental dose-optimization program which includes automated exposure control, adjustment of the mA and/or kV according to patient size and/or use of iterative reconstruction technique. COMPARISON:  MR lumbar spine 04/23/2018 FINDINGS: CT THORACIC SPINE FINDINGS Alignment: Grade 1 anterolisthesis of T1 on T2, T2 on T3, T3 on T4, T4 on T5.\. Vertebrae: No acute fracture or focal pathologic process. Paraspinal and other soft tissues: Negative. Disc levels: Multilevel intervertebral disc space vacuum phenomenon. CT LUMBAR SPINE FINDINGS Segmentation: 5 lumbar type vertebrae. Alignment: Stable grade 1 anterolisthesis of L3 on L4 and L4 on L5. Vertebrae: L3-L5 posterolateral surgical hardware. No acute fracture or focal pathologic process. Paraspinal and other soft tissues:  Negative. Disc levels: Multilevel intervertebral disc space vacuum phenomenon. Other: Coarsened interlobular softball thickening. Peripheral reticulations. Atherosclerotic plaque. Colonic diverticulosis. IMPRESSION: CT THORACIC SPINE IMPRESSION No acute displaced fracture or traumatic listhesis of the thoracic spine. CT LUMBAR SPINE IMPRESSION No acute displaced fracture or traumatic listhesis of the lumbar spine. Aortic Atherosclerosis (ICD10-I70.0). Electronically Signed   By: Morgane  Naveau M.D.   On: 11/17/2023 00:47   CT Lumbar Spine Wo Contrast Result Date: 11/17/2023 CLINICAL DATA:  Back trauma, no prior imaging (Age >= 16y) EXAM: CT THORACIC AND LUMBAR SPINE WITHOUT CONTRAST TECHNIQUE: Multidetector CT imaging of the thoracic and lumbar spine was performed without contrast. Multiplanar CT image reconstructions were also generated. RADIATION DOSE REDUCTION: This exam was performed according to the departmental dose-optimization program which includes automated exposure control, adjustment of the mA and/or kV according to patient size and/or use of iterative reconstruction technique. COMPARISON:  MR lumbar spine 04/23/2018 FINDINGS: CT THORACIC SPINE FINDINGS Alignment: Grade 1 anterolisthesis of T1 on T2, T2 on T3, T3 on T4, T4 on T5.\. Vertebrae: No acute fracture or focal pathologic process. Paraspinal and other soft tissues: Negative. Disc levels: Multilevel intervertebral disc space vacuum phenomenon. CT LUMBAR SPINE FINDINGS Segmentation: 5 lumbar type vertebrae. Alignment: Stable grade 1 anterolisthesis of L3 on L4 and L4 on L5. Vertebrae: L3-L5 posterolateral surgical hardware. No acute fracture or focal pathologic process. Paraspinal and other soft tissues: Negative. Disc levels: Multilevel intervertebral disc space vacuum phenomenon. Other: Coarsened interlobular softball thickening. Peripheral reticulations. Atherosclerotic plaque. Colonic diverticulosis. IMPRESSION: CT THORACIC SPINE IMPRESSION  No acute displaced fracture or traumatic listhesis of the thoracic spine. CT LUMBAR SPINE IMPRESSION No acute displaced fracture or traumatic listhesis of the lumbar spine. Aortic Atherosclerosis (ICD10-I70.0). Electronically Signed   By: Morgane  Naveau M.D.   On: 11/17/2023 00:47   CT Head Wo Contrast Result Date: 11/16/2023 CLINICAL DATA:  Head trauma, minor (Age >= 65y); Neck trauma (Age >= 65y). Fall EXAM: CT HEAD WITHOUT CONTRAST CT CERVICAL SPINE WITHOUT CONTRAST TECHNIQUE: Multidetector CT imaging of the head and cervical spine was performed following the standard protocol without intravenous contrast. Multiplanar CT image reconstructions of the cervical spine were also generated. RADIATION DOSE REDUCTION: This exam was performed according to the departmental dose-optimization program which includes automated exposure control, adjustment of the mA and/or kV according to patient size and/or use of iterative reconstruction technique. COMPARISON:  CT head 12/06/2021, CT head and C-spine 10/22/2021 FINDINGS: CT HEAD FINDINGS Brain: Cerebral ventricle sizes are concordant with the degree of cerebral volume loss. Patchy and confluent areas of decreased attenuation are noted throughout the deep and periventricular  white matter of the cerebral hemispheres bilaterally, compatible with chronic microvascular ischemic disease. No evidence of large-territorial acute infarction. No parenchymal hemorrhage. No mass lesion. No extra-axial collection. No mass effect or midline shift. No hydrocephalus. Basilar cisterns are patent. Vascular: No hyperdense vessel. Atherosclerotic calcifications are present within the cavernous internal carotid and vertebral arteries. Skull: Old healed right orbital floor fracture. No acute fracture or focal lesion. Sinuses/Orbits: Paranasal sinuses and mastoid air cells are clear. Bilateral lens replacement. Otherwise the orbits are unremarkable. Other: None. CT CERVICAL SPINE FINDINGS  Alignment: Grade 1 anterolisthesis of C2 on C3, grade 1 anterolisthesis of C3 on C4. Retrolisthesis of C4 on C5 and C5 on C6. Skull base and vertebrae: Multilevel severe degenerative change of the spine with associated severe osseous neural foraminal stenosis of the right C4-C5 and C5-C6 level. No severe osseous central canal stenosis. No acute fracture. No aggressive appearing focal osseous lesion or focal pathologic process. Soft tissues and spinal canal: No prevertebral fluid or swelling. No visible canal hematoma. Upper chest: Biapical pleural/pulmonary scarring with associated calcifications on the left. Chronic coarsened interstitial markings. Other: Atherosclerotic plaque of the carotid arteries within the neck. IMPRESSION: 1. No acute intracranial abnormality. 2. No acute displaced fracture or traumatic listhesis of the cervical spine. 3. Multilevel severe degenerative change of the spine with associated severe osseous neural foraminal stenosis of the right C4-C5 and C5-C6 level. Electronically Signed   By: Morgane  Naveau M.D.   On: 11/16/2023 23:11   CT Cervical Spine Wo Contrast Result Date: 11/16/2023 CLINICAL DATA:  Head trauma, minor (Age >= 65y); Neck trauma (Age >= 65y). Fall EXAM: CT HEAD WITHOUT CONTRAST CT CERVICAL SPINE WITHOUT CONTRAST TECHNIQUE: Multidetector CT imaging of the head and cervical spine was performed following the standard protocol without intravenous contrast. Multiplanar CT image reconstructions of the cervical spine were also generated. RADIATION DOSE REDUCTION: This exam was performed according to the departmental dose-optimization program which includes automated exposure control, adjustment of the mA and/or kV according to patient size and/or use of iterative reconstruction technique. COMPARISON:  CT head 12/06/2021, CT head and C-spine 10/22/2021 FINDINGS: CT HEAD FINDINGS Brain: Cerebral ventricle sizes are concordant with the degree of cerebral volume loss. Patchy and  confluent areas of decreased attenuation are noted throughout the deep and periventricular white matter of the cerebral hemispheres bilaterally, compatible with chronic microvascular ischemic disease. No evidence of large-territorial acute infarction. No parenchymal hemorrhage. No mass lesion. No extra-axial collection. No mass effect or midline shift. No hydrocephalus. Basilar cisterns are patent. Vascular: No hyperdense vessel. Atherosclerotic calcifications are present within the cavernous internal carotid and vertebral arteries. Skull: Old healed right orbital floor fracture. No acute fracture or focal lesion. Sinuses/Orbits: Paranasal sinuses and mastoid air cells are clear. Bilateral lens replacement. Otherwise the orbits are unremarkable. Other: None. CT CERVICAL SPINE FINDINGS Alignment: Grade 1 anterolisthesis of C2 on C3, grade 1 anterolisthesis of C3 on C4. Retrolisthesis of C4 on C5 and C5 on C6. Skull base and vertebrae: Multilevel severe degenerative change of the spine with associated severe osseous neural foraminal stenosis of the right C4-C5 and C5-C6 level. No severe osseous central canal stenosis. No acute fracture. No aggressive appearing focal osseous lesion or focal pathologic process. Soft tissues and spinal canal: No prevertebral fluid or swelling. No visible canal hematoma. Upper chest: Biapical pleural/pulmonary scarring with associated calcifications on the left. Chronic coarsened interstitial markings. Other: Atherosclerotic plaque of the carotid arteries within the neck. IMPRESSION: 1. No acute intracranial abnormality.  2. No acute displaced fracture or traumatic listhesis of the cervical spine. 3. Multilevel severe degenerative change of the spine with associated severe osseous neural foraminal stenosis of the right C4-C5 and C5-C6 level. Electronically Signed   By: Morgane  Naveau M.D.   On: 11/16/2023 23:11   DG Hip Unilat With Pelvis 2-3 Views Right Result Date:  11/16/2023 CLINICAL DATA:  fall, pain EXAM: DG HIP (WITH OR WITHOUT PELVIS) 2-3V RIGHT COMPARISON:  CT abdomen pelvis 03/06/2022 FINDINGS: Cortical irregularity with likely acute displaced and comminuted right inferior pubic rami fracture. There is no evidence of hip fracture or dislocation of the right hip. No acute displaced fracture or dislocation of the left hip. No diastasis of the bones of the pelvis. Surgical hardware noted along the lower visualized lumbar spine. There is no evidence of arthropathy or other focal bone abnormality. Vascular calcifications. IMPRESSION: Cortical irregularity with likely acute displaced and comminuted right inferior pubic rami fracture. Electronically Signed   By: Morgane  Naveau M.D.   On: 11/16/2023 23:05    Assessment/Plan   Right pelvic fractures with hematoma, post-angioembolization Sustained right superior and inferior pubic rami fractures with pelvic hematoma and active extravasation. Underwent angioembolization on November 17, 2023. Currently weight-bearing as tolerated. Pain managed with Tylenol  and muscle relaxants. - Continue weight-bearing as tolerated. - Continue pain management with Tylenol  and muscle relaxants. - Work with physical and occupational therapy. -f/u with ortho  Chronic heart failure with reduced ejection fraction Congestive heart failure with reduced ejection fraction (45-50%) and hypokinesis. Last echocardiogram on September 24, 2023. Currently on Lasix . - Continue Lasix . - Follow up with cardiology.  Atrial fibrillation, anticoagulation on hold Atrial fibrillation with anticoagulation (Eliquis ) on hold due to recent pelvic fracture and hematoma. - Follow up with cardiology regarding anticoagulation management.  Chronic interstitial lung disease  Chronic interstitial lung disease with CHF Mild shortness of breath at rest. Oxygen saturation at 99% on 2 liters of oxygen. CT scan showed consolidation in the posterior right middle lobe,  possibly pneumonia/aspiration  -no fever or sputum production -hospital elected to monitor -monitor for cough, fever, etc -recommend f/u CXR - Continue 2 liters of oxygen. - Titrate oxygen over time. - Use incentive spirometer with nurse assistance to prevent atelectasis.  Chronic anemia secondary to blood loss and also iron  def Chronic anemia with hemoglobin at 7.3 upon discharge. Currently on iron  supplementation. - Continue iron  supplementation. - Recheck CBC next week. - Check BMP next week.  Dementia Underlying dementia. Resides in a skilled care area.  Rheumatoid arthritis Rheumatoid arthritis managed with Arava  and Enbrel.  HTN Controlled   Hyperlipidemia Off lipitor per cardiology   Last LDL in January was 41.  Hypothyroidism Hypothyroidism with last TSH in June 2025 at 2.92. Continuing thyroid  supplementation. - Continue thyroid  supplementation.  History of colon carcinoma, under oncology surveillance History of colon carcinoma. Last CEA was 6.51 on Aug 13, 2023. Under oncology surveillance.    Addendum Nurse reports pain 9/10 when working with therapy not relieved with current therapy Will try tramadol 

## 2023-11-22 DIAGNOSIS — M6281 Muscle weakness (generalized): Secondary | ICD-10-CM | POA: Diagnosis not present

## 2023-11-22 DIAGNOSIS — R2681 Unsteadiness on feet: Secondary | ICD-10-CM | POA: Diagnosis not present

## 2023-11-22 DIAGNOSIS — M25551 Pain in right hip: Secondary | ICD-10-CM | POA: Diagnosis not present

## 2023-11-22 DIAGNOSIS — S32810S Multiple fractures of pelvis with stable disruption of pelvic ring, sequela: Secondary | ICD-10-CM | POA: Diagnosis not present

## 2023-11-22 DIAGNOSIS — R2689 Other abnormalities of gait and mobility: Secondary | ICD-10-CM | POA: Diagnosis not present

## 2023-11-22 DIAGNOSIS — R41841 Cognitive communication deficit: Secondary | ICD-10-CM | POA: Diagnosis not present

## 2023-11-22 MED ORDER — TRAMADOL HCL 50 MG PO TABS: 25.0000 mg | ORAL_TABLET | Freq: Three times a day (TID) | ORAL | 0 refills | Status: DC | PRN

## 2023-11-22 NOTE — Addendum Note (Signed)
 Addended by: DARLEAN TAWNI CROME on: 11/22/2023 11:54 AM   Modules accepted: Orders

## 2023-11-25 DIAGNOSIS — S32810S Multiple fractures of pelvis with stable disruption of pelvic ring, sequela: Secondary | ICD-10-CM | POA: Diagnosis not present

## 2023-11-25 DIAGNOSIS — M25551 Pain in right hip: Secondary | ICD-10-CM | POA: Diagnosis not present

## 2023-11-25 DIAGNOSIS — M6281 Muscle weakness (generalized): Secondary | ICD-10-CM | POA: Diagnosis not present

## 2023-11-25 DIAGNOSIS — R41841 Cognitive communication deficit: Secondary | ICD-10-CM | POA: Diagnosis not present

## 2023-11-25 DIAGNOSIS — Z9181 History of falling: Secondary | ICD-10-CM | POA: Diagnosis not present

## 2023-11-25 DIAGNOSIS — R2681 Unsteadiness on feet: Secondary | ICD-10-CM | POA: Diagnosis not present

## 2023-11-25 DIAGNOSIS — R2689 Other abnormalities of gait and mobility: Secondary | ICD-10-CM | POA: Diagnosis not present

## 2023-11-25 DIAGNOSIS — M25552 Pain in left hip: Secondary | ICD-10-CM | POA: Diagnosis not present

## 2023-11-26 ENCOUNTER — Non-Acute Institutional Stay (SKILLED_NURSING_FACILITY): Payer: Self-pay | Admitting: Orthopedic Surgery

## 2023-11-26 ENCOUNTER — Encounter: Payer: Self-pay | Admitting: Orthopedic Surgery

## 2023-11-26 DIAGNOSIS — S3282XD Multiple fractures of pelvis without disruption of pelvic ring, subsequent encounter for fracture with routine healing: Secondary | ICD-10-CM | POA: Diagnosis not present

## 2023-11-26 DIAGNOSIS — R2689 Other abnormalities of gait and mobility: Secondary | ICD-10-CM | POA: Diagnosis not present

## 2023-11-26 DIAGNOSIS — J984 Other disorders of lung: Secondary | ICD-10-CM

## 2023-11-26 DIAGNOSIS — S32810S Multiple fractures of pelvis with stable disruption of pelvic ring, sequela: Secondary | ICD-10-CM | POA: Diagnosis not present

## 2023-11-26 DIAGNOSIS — I5032 Chronic diastolic (congestive) heart failure: Secondary | ICD-10-CM | POA: Diagnosis not present

## 2023-11-26 DIAGNOSIS — M6281 Muscle weakness (generalized): Secondary | ICD-10-CM | POA: Diagnosis not present

## 2023-11-26 DIAGNOSIS — R140 Abdominal distension (gaseous): Secondary | ICD-10-CM

## 2023-11-26 DIAGNOSIS — M25551 Pain in right hip: Secondary | ICD-10-CM | POA: Diagnosis not present

## 2023-11-26 DIAGNOSIS — R41841 Cognitive communication deficit: Secondary | ICD-10-CM | POA: Diagnosis not present

## 2023-11-26 DIAGNOSIS — R2681 Unsteadiness on feet: Secondary | ICD-10-CM | POA: Diagnosis not present

## 2023-11-26 DIAGNOSIS — I10 Essential (primary) hypertension: Secondary | ICD-10-CM | POA: Diagnosis not present

## 2023-11-26 LAB — CBC AND DIFFERENTIAL
HCT: 22 — AB (ref 36–46)
Hemoglobin: 7.5 — AB (ref 12.0–16.0)
Platelets: 231 K/uL (ref 150–400)
WBC: 7.6

## 2023-11-26 LAB — CBC: RBC: 2.07 — AB (ref 3.87–5.11)

## 2023-11-26 NOTE — Progress Notes (Unsigned)
 [facility] Room Number: 129-P Place of Service:  SNF [id number]) Provider:  Greig Cluster, NP  PCP: Charlanne Fredia CROME, MD  Patient Care Team: Charlanne Fredia CROME, MD as PCP - General (Internal Medicine) Croitoru, Jerel, MD as PCP - Cardiology (Cardiology) [name], DO as Consulting Physician (Neurology)  Extended Emergency Contact Information Primary Emergency Contact: [name],[name] Address: [address]          [place], [place] [address] [place] Home Phone: [phone] Mobile Phone: [phone] Relation: Daughter Secondary Emergency Contact: [name] Address: [address]          [place], [place] [address] [place] Phone: [phone] Relation: Son  Code Status:  DNR Goals of care: Advanced Directive information    11/26/2023    2:37 PM  Advanced Directives  Does Patient Have a Medical Advance Directive? Yes  Type of Estate agent of [place];Living will  Does patient want to make changes to medical advance directive? No - Patient declined  Copy of Healthcare Power of Attorney in Chart? Yes - validated most recent copy scanned in chart (See row information)     Chief Complaint  Patient presents with   Abdominal Distension    HPI:  Pt is a 89 y.o. female seen today for an acute visit for    Past Medical History:  Diagnosis Date   Allergy    seasonal   Anemia    Anxiety    Arthritis    Back   Asymptomatic menopausal state    Atrial fibrillation (HCC)    Cancer (HCC) 2006   Colon.  Basal Cell Skin cancer- right arm   Cataract    removed bilateral   Chronic atrial fibrillation (HCC)    Colon cancer (HCC)    Constipation due to pain medication therapy    after heart surgery   Deficiency of other specified B group vitamins    Diplopia    Disorientation, unspecified    Dysrhythmia    PAF   GERD (gastroesophageal reflux disease)    Heart murmur     Hyperlipidemia    Hypertension    Hypothyroidism    Malignant neoplasm of colon, unspecified (HCC)    Other specified diseases of blood and blood-forming organs    Other specified disorders of bone density and structure, unspecified forearm    Other thrombophilia (HCC)    per matrix   Overactive bladder    per Matrix   Personal history of other malignant neoplasm of large intestine    Presbycusis, bilateral    per matrix   RA (rheumatoid arthritis) (HCC)    Restless leg    Seizures (HCC)    after Heart Surgey   Stroke (HCC)    TIA- found by neurologist after    Valgus deformity, not elsewhere classified, right knee    per matrix   Vascular dementia, mild, without behavioral disturbance, psychotic disturbance, mood disturbance, and anxiety (HCC)    per matrix   Past Surgical History:  Procedure Laterality Date   ABDOMINAL HYSTERECTOMY  1970   Partial    COLON RESECTION  2006   cancer   COLON SURGERY     COLONOSCOPY     EYE SURGERY Bilateral    Cataract   IR ANGIOGRAM PELVIS SELECTIVE OR SUPRASELECTIVE  11/17/2023   IR ANGIOGRAM SELECTIVE EACH ADDITIONAL VESSEL  11/17/2023   IR EMBO ART  VEN HEMORR LYMPH EXTRAV  INC GUIDE ROADMAPPING  11/17/2023   IR US  GUIDE VASC ACCESS RIGHT  11/17/2023   MAXIMUM ACCESS (MAS)POSTERIOR LUMBAR INTERBODY FUSION (PLIF) 2 LEVEL N/A 04/12/2015   Procedure: Lumbar Three-Five Decompression, Pedicle Screw Fixation, and Posteriolateral Arthrodesis;  Surgeon: Fairy Levels, MD;  Location: [facility];  Service: Neurosurgery;  Laterality: N/A;  L3-4 L4-5 Maximum access posterior lumbar fusion, possible interbodies and resection of synovial cyst at L4-5   MITRAL VALVE REPAIR  01/20/2013   Gore-tex cords to P1, P2, and P3. Magic suture to posterior medial commisure, #30 Physio 1 ring. Done in [place]    TONSILLECTOMY     about 1940   TRICUSPID VALVE SURGERY  01/20/2013   #28 TriAd ring done in [place]     Allergies  Allergen Reactions   Claritin  [Loratadine] Other (See Comments)    Listed on MAR Unknown reaction   Codeine Other (See Comments)    just don't take it well   Gabapentin  Other (See Comments)    Dizziness    Molds & Smuts Other (See Comments)    Dust.  Reaction is not listed on MAR    Pollen Extract Swelling    Grass   Bee Venom Swelling and Rash   Wasp Venom Swelling and Rash    Allergies as of 11/26/2023       Reactions   Claritin [loratadine] Other (See Comments)   Listed on MAR Unknown reaction   Codeine Other (See Comments)   just don't take it well   Gabapentin  Other (See Comments)   Dizziness    Molds & Smuts Other (See Comments)   Dust.  Reaction is not listed on MAR    Pollen Extract Swelling   Grass   Bee Venom Swelling, Rash   Wasp Venom Swelling, Rash        Medication List        Accurate as of November 26, 2023  2:38 PM. If you have any questions, ask your nurse or doctor.          acetaminophen  500 MG tablet Commonly known as: TYLENOL  Take 2 tablets (1,000 mg total) by mouth every 6 (six) hours as needed for mild pain (pain score 1-3) or fever.   amoxicillin 500 MG capsule Commonly known as: AMOXIL Take 2,000 mg by mouth as needed.   cloNIDine  0.1 MG tablet Commonly known as: CATAPRES  Take 1 tablet (0.1 mg total) by mouth daily as needed. If SBP >180 only   cyanocobalamin  1000 MCG tablet Commonly known as: VITAMIN B12 Take 1,000 mcg by mouth in the morning.   docusate sodium  100 MG capsule Commonly known as: COLACE Take 100 mg by mouth daily as needed for mild constipation.   docusate sodium  100 MG capsule Commonly known as: COLACE Take 1 capsule (100 mg total) by mouth daily.   Enbrel SureClick 50 MG/ML injection Generic drug: etanercept Inject 50 mg into the skin once a week. Tuesday   furosemide  20 MG tablet Commonly known as: LASIX  Take 20 mg by mouth as needed.   furosemide  20 MG tablet Commonly known as: LASIX  Take 1 tablet (20 mg total) by  mouth daily for 2 days.   furosemide  20 MG tablet Commonly known as: LASIX  Take 1 tablet (20 mg total) by mouth every other day. Can take an additional does of lasix  for a weight gain 2-3 lbs overnight or increased swelling of the legs.   Iron  (Ferrous Sulfate ) 325 (65 Fe) MG Tabs Take 325 mg by mouth  daily.   leflunomide  20 MG tablet Commonly known as: ARAVA  Take 20 mg by mouth in the morning.   levalbuterol  0.63 MG/3ML nebulizer solution Commonly known as: Xopenex  Take 3 mLs (0.63 mg total) by nebulization every 6 (six) hours as needed for wheezing or shortness of breath.   levothyroxine  88 MCG tablet Commonly known as: SYNTHROID  Take 88 mcg by mouth daily before breakfast.   melatonin 5 MG Tabs Take 5 mg by mouth at bedtime.   methocarbamol  500 MG tablet Commonly known as: ROBAXIN  Take 1 tablet (500 mg total) by mouth every 8 (eight) hours as needed for muscle spasms (musculoskeletal pain).   metoprolol  tartrate 100 MG tablet Commonly known as: LOPRESSOR  Take 150 mg by mouth daily. If heart rate <50 or SBP >180 or <100   MULTIVITAMIN ADULT PO Take 1 tablet by mouth every morning.   omeprazole  40 MG capsule Commonly known as: PRILOSEC TAKE 1 CAPSULE DAILY   polyethylene glycol 17 g packet Commonly known as: MIRALAX  / GLYCOLAX  Take 17 g by mouth daily as needed for moderate constipation (constipation).   PreserVision AREDS 2 Caps Take 1 capsule by mouth in the morning and at bedtime.   traMADol  HCl 25 MG Tabs Take 25 mg by mouth every 8 (eight) hours as needed.   traMADol  HCl 25 MG Tabs Take 50 mg by mouth every 8 (eight) hours as needed.   traMADol  50 MG tablet Commonly known as: ULTRAM  Take 0.5-1 tablets (25-50 mg total) by mouth every 8 (eight) hours as needed for up to 14 days. 25 for moderate pain and 50 for severe pain   UNABLE TO FIND Apply 1 Application topically every morning. JOBST HOSE NEED SIZE   valsartan  160 MG tablet Commonly known as:  DIOVAN  Take 160 mg by mouth every evening.   Vitamin D  50 MCG (2000 UT) Caps Take 2,000 Units by mouth in the morning.        Review of Systems  Immunization History  Administered Date(s) Administered   Fluad Quad(high Dose 65+) 01/05/2019, 12/07/2021, 01/15/2023   Fluad Trivalent(High Dose 65+) 01/15/2023   H1N1 04/07/2008   INFLUENZA, HIGH DOSE SEASONAL PF 01/15/2012, 12/23/2014, 01/22/2020   Influenza Whole 12/29/2008, 12/22/2009   Influenza,inj,Quad PF,6+ Mos 01/22/2014, 12/10/2016, 01/17/2018   Influenza,trivalent, recombinat, inj, PF 01/05/2016   Influenza-Unspecified 04/07/2008, 12/15/2014, 01/13/2021   Moderna Covid-19 Vaccine  Bivalent Booster 69yrs & up 01/29/2022, 01/15/2023   Moderna Sars-Covid-2 Vaccination 04/07/2019, 05/05/2019, 02/09/2020   PFIZER(Purple Top)SARS-COV-2 Vaccination 01/06/2021   PPD Test 07/17/2010, 04/19/2015, 04/25/2015   Pfizer Covid-19 Vaccine Bivalent Booster 68yrs & up 07/19/2022   Pneumococcal Conjugate-13 11/10/2013   Pneumococcal Polysaccharide-23 12/27/2010, 12/05/2016   RSV,unspecified 03/28/2022   Tdap 04/15/2017   Zoster Recombinant(Shingrix) 04/09/2017, 06/18/2017   Pertinent  Health Maintenance Due  Topic Date Due   INFLUENZA VACCINE  10/25/2023   DEXA SCAN  Completed      07/09/2022    1:44 PM 08/29/2022    3:31 PM 04/18/2023   12:52 PM 10/04/2023   10:46 AM 10/23/2023    2:10 PM  Fall Risk  Falls in the past year? 0 1 0 0 0  Was there an injury with Fall? 0 1 0 0 0  Fall Risk Category Calculator 0 2 0 0 0  Patient at Risk for Falls Due to History of fall(s);Impaired balance/gait;Impaired mobility   History of fall(s) History of fall(s);Impaired balance/gait  Fall risk Follow up Falls evaluation completed Falls evaluation completed Falls evaluation completed Falls  evaluation completed Falls evaluation completed;Education provided   Functional Status Survey:    Vitals:   11/26/23 1254  BP: 118/65  Pulse: 83  Resp: 19   Temp: 98.1 F (36.7 C)  SpO2: 98%  Weight: 128 lb 9.6 oz (58.3 kg)  Height: 5' 2 (1.575 m)   Body mass index is 23.52 kg/m. Physical Exam  Labs reviewed: Recent Labs    11/18/23 0649 11/19/23 0512 11/20/23 0426  NA 135 131* 131*  K 4.3 4.1 3.8  CL 104 100 97*  CO2 23 22 22   GLUCOSE 100* 123* 96  BUN 25* 35* 30*  CREATININE 0.89 0.98 0.88  CALCIUM  8.5* 8.6* 8.2*   Recent Labs    12/31/22 0000 02/05/23 1146 08/02/23 0000 11/16/23 2210  AST 26 18 25  44*  ALT 36* 19 22 42  ALKPHOS 104 91 118 104  BILITOT  --  0.8  --  0.8  PROT  --  7.1  --  6.6  ALBUMIN 4.1 4.1  --  3.6   Recent Labs    02/05/23 1146 04/04/23 0000 08/13/23 1130 11/16/23 2210 11/17/23 0350 11/18/23 1242 11/19/23 0512 11/20/23 0426  WBC 7.6   < > 7.6 10.5   < > 10.2 9.9 8.6  NEUTROABS 4.5  --  4.3 6.3  --   --   --   --   HGB 11.5*   < > 11.8* 10.6*   < > 7.6* 7.3* 7.3*  HCT 34.6*   < > 35.0* 32.1*   < > 22.7* 22.4* 22.1*  MCV 102.4*  --  103.6* 105.6*   < > 105.1* 105.2* 105.7*  PLT 163   < > 177 196   < > 128* 138* 142*   < > = values in this interval not displayed.   Lab Results  Component Value Date   TSH 2.92 09/13/2023   Lab Results  Component Value Date   HGBA1C 5.5 01/12/2013   Lab Results  Component Value Date   CHOL 114 08/02/2023   HDL 53 08/02/2023   LDLCALC 44 08/02/2023   TRIG 86 08/02/2023    Significant Diagnostic Results in last 30 days:  IR Angiogram Pelvis Selective Or Supraselective Result Date: 11/17/2023 INDICATION: Clemens, pelvic fracture with expanding hematoma and continued arterial extravasation noted on recent CT EXAM: ULTRASOUND GUIDANCE FOR VASCULAR ACCESS RIGHT INTERNAL ILIAC ARTERIOGRAPHY SUB SELECTIVE SECOND ORDER BRANCH EMBOLIZATION X2 MEDICATIONS: No periprocedural antibiotics were indicated ANESTHESIA/SEDATION: Intravenous Fentanyl  50mcg and Versed  1mg  were administered by RN during a total moderate (conscious) sedation time of 35 minutes; the  patient's level of consciousness and physiological / cardiorespiratory status were monitored continuously by radiology RN under my direct supervision. PROCEDURE: Informed consent was obtained from the patient following explanation of the procedure, risks, benefits and alternatives. The patient understands, agrees and consents for the procedure. All questions were addressed. A time out was performed prior to the initiation of the procedure. Maximal barrier sterile technique utilized including caps, mask, sterile gowns, sterile gloves, large sterile drape, hand hygiene, and Betadine prep. Right common femoral artery accessed with a micropuncture set under real-time ultrasound guidance, exchanged for a 5 French vascular sheath, through which a 5 [place] Kumpe catheter was advanced and used to selectively catheterize the right internal iliac artery. Confirmatory arteriography was performed. A coaxial Renegade hi Flo microcatheter with a fathom 016 guidewire was advanced to selectively catheterize 2 branches of the anterior division right internal iliac artery for selective embolization using Gelfoam  slurry, to stasis of flow into peripheral branches. Follow-up arteriography was performed each level. No evidence of nontarget embolization. The catheters and sheath were then removed and hemostasis achieved with the aid of calc to device. The patient tolerated the procedure well. CONTRAST:  35 mL Omnipaque  300 IA FLUOROSCOPY: Radiation Exposure Index (as provided by the fluoroscopic device): 292 mGy Kerma COMPLICATIONS: None immediate. IMPRESSION: 1. Technically successful sub selective embolization of right internal iliac artery branches with temporary embolic (Gelfoam slurry) without immediate complication. Electronically Signed   By: JONETTA Faes M.D.   On: 11/17/2023 14:46   IR US  Guide Vasc Access Right Result Date: 11/17/2023 INDICATION: Clemens, pelvic fracture with expanding hematoma and continued arterial  extravasation noted on recent CT EXAM: ULTRASOUND GUIDANCE FOR VASCULAR ACCESS RIGHT INTERNAL ILIAC ARTERIOGRAPHY SUB SELECTIVE SECOND ORDER BRANCH EMBOLIZATION X2 MEDICATIONS: No periprocedural antibiotics were indicated ANESTHESIA/SEDATION: Intravenous Fentanyl  50mcg and Versed  1mg  were administered by RN during a total moderate (conscious) sedation time of 35 minutes; the patient's level of consciousness and physiological / cardiorespiratory status were monitored continuously by radiology RN under my direct supervision. PROCEDURE: Informed consent was obtained from the patient following explanation of the procedure, risks, benefits and alternatives. The patient understands, agrees and consents for the procedure. All questions were addressed. A time out was performed prior to the initiation of the procedure. Maximal barrier sterile technique utilized including caps, mask, sterile gowns, sterile gloves, large sterile drape, hand hygiene, and Betadine prep. Right common femoral artery accessed with a micropuncture set under real-time ultrasound guidance, exchanged for a 5 French vascular sheath, through which a 5 [place] Kumpe catheter was advanced and used to selectively catheterize the right internal iliac artery. Confirmatory arteriography was performed. A coaxial Renegade hi Flo microcatheter with a fathom 016 guidewire was advanced to selectively catheterize 2 branches of the anterior division right internal iliac artery for selective embolization using Gelfoam slurry, to stasis of flow into peripheral branches. Follow-up arteriography was performed each level. No evidence of nontarget embolization. The catheters and sheath were then removed and hemostasis achieved with the aid of calc to device. The patient tolerated the procedure well. CONTRAST:  35 mL Omnipaque  300 IA FLUOROSCOPY: Radiation Exposure Index (as provided by the fluoroscopic device): 292 mGy Kerma COMPLICATIONS: None immediate. IMPRESSION: 1.  Technically successful sub selective embolization of right internal iliac artery branches with temporary embolic (Gelfoam slurry) without immediate complication. Electronically Signed   By: JONETTA Faes M.D.   On: 11/17/2023 14:46   IR EMBO ART  VEN HEMORR LYMPH EXTRAV  INC GUIDE ROADMAPPING Result Date: 11/17/2023 INDICATION: Clemens, pelvic fracture with expanding hematoma and continued arterial extravasation noted on recent CT EXAM: ULTRASOUND GUIDANCE FOR VASCULAR ACCESS RIGHT INTERNAL ILIAC ARTERIOGRAPHY SUB SELECTIVE SECOND ORDER BRANCH EMBOLIZATION X2 MEDICATIONS: No periprocedural antibiotics were indicated ANESTHESIA/SEDATION: Intravenous Fentanyl  50mcg and Versed  1mg  were administered by RN during a total moderate (conscious) sedation time of 35 minutes; the patient's level of consciousness and physiological / cardiorespiratory status were monitored continuously by radiology RN under my direct supervision. PROCEDURE: Informed consent was obtained from the patient following explanation of the procedure, risks, benefits and alternatives. The patient understands, agrees and consents for the procedure. All questions were addressed. A time out was performed prior to the initiation of the procedure. Maximal barrier sterile technique utilized including caps, mask, sterile gowns, sterile gloves, large sterile drape, hand hygiene, and Betadine prep. Right common femoral artery accessed with a micropuncture set under real-time ultrasound guidance, exchanged  for a 5 [place] vascular sheath, through which a 5 [place] Kumpe catheter was advanced and used to selectively catheterize the right internal iliac artery. Confirmatory arteriography was performed. A coaxial Renegade hi Flo microcatheter with a fathom 016 guidewire was advanced to selectively catheterize 2 branches of the anterior division right internal iliac artery for selective embolization using Gelfoam slurry, to stasis of flow into peripheral branches.  Follow-up arteriography was performed each level. No evidence of nontarget embolization. The catheters and sheath were then removed and hemostasis achieved with the aid of calc to device. The patient tolerated the procedure well. CONTRAST:  35 mL Omnipaque  300 IA FLUOROSCOPY: Radiation Exposure Index (as provided by the fluoroscopic device): 292 mGy Kerma COMPLICATIONS: None immediate. IMPRESSION: 1. Technically successful sub selective embolization of right internal iliac artery branches with temporary embolic (Gelfoam slurry) without immediate complication. Electronically Signed   By: JONETTA Faes M.D.   On: 11/17/2023 14:46   IR Angiogram Selective Each Additional Vessel Result Date: 11/17/2023 INDICATION: Clemens, pelvic fracture with expanding hematoma and continued arterial extravasation noted on recent CT EXAM: ULTRASOUND GUIDANCE FOR VASCULAR ACCESS RIGHT INTERNAL ILIAC ARTERIOGRAPHY SUB SELECTIVE SECOND ORDER BRANCH EMBOLIZATION X2 MEDICATIONS: No periprocedural antibiotics were indicated ANESTHESIA/SEDATION: Intravenous Fentanyl  50mcg and Versed  1mg  were administered by RN during a total moderate (conscious) sedation time of 35 minutes; the patient's level of consciousness and physiological / cardiorespiratory status were monitored continuously by radiology RN under my direct supervision. PROCEDURE: Informed consent was obtained from the patient following explanation of the procedure, risks, benefits and alternatives. The patient understands, agrees and consents for the procedure. All questions were addressed. A time out was performed prior to the initiation of the procedure. Maximal barrier sterile technique utilized including caps, mask, sterile gowns, sterile gloves, large sterile drape, hand hygiene, and Betadine prep. Right common femoral artery accessed with a micropuncture set under real-time ultrasound guidance, exchanged for a 5 French vascular sheath, through which a 5 [place] Kumpe catheter was  advanced and used to selectively catheterize the right internal iliac artery. Confirmatory arteriography was performed. A coaxial Renegade hi Flo microcatheter with a fathom 016 guidewire was advanced to selectively catheterize 2 branches of the anterior division right internal iliac artery for selective embolization using Gelfoam slurry, to stasis of flow into peripheral branches. Follow-up arteriography was performed each level. No evidence of nontarget embolization. The catheters and sheath were then removed and hemostasis achieved with the aid of calc to device. The patient tolerated the procedure well. CONTRAST:  35 mL Omnipaque  300 IA FLUOROSCOPY: Radiation Exposure Index (as provided by the fluoroscopic device): 292 mGy Kerma COMPLICATIONS: None immediate. IMPRESSION: 1. Technically successful sub selective embolization of right internal iliac artery branches with temporary embolic (Gelfoam slurry) without immediate complication. Electronically Signed   By: JONETTA Faes M.D.   On: 11/17/2023 14:46   CT CHEST W CONTRAST Result Date: 11/17/2023 CLINICAL DATA:  Fall, with suspected acute comminuted right inferior pubic ramus fracture, concern is for urinary injury. Blunt polytrauma with potential for acute chest trauma as well. EXAM: CT CHEST WITH CONTRAST CT ABDOMEN AND PELVIS WITH AND WITHOUT CONTRAST TECHNIQUE: Multidetector CT imaging of the chest was performed during intravenous contrast administration. Multidetector CT imaging of the abdomen and pelvis was performed prior to and following the standard protocol before and during bolus administration of intravenous contrast. RADIATION DOSE REDUCTION: This exam was performed according to the departmental dose-optimization program which includes automated exposure control, adjustment of the mA and/or kV  according to patient size and/or use of iterative reconstruction technique. CONTRAST:  OMNIPAQUE  IOHEXOL  350 MG/ML SOLN COMPARISON:  Most recent chest  x-ray was PA Lat chest 09/23/2023, before that chest radiograph 08/06/2022. Comparison is made with the chest, abdomen and pelvis CT with contrast 03/31/2021, abdomen and pelvis CT with IV contrast 03/06/2022. FINDINGS: CT CHEST FINDINGS Cardiovascular: The heart is slightly enlarged. Again noted are sternotomy sutures with prosthetic tricuspid and mitral valves. There is chronic left atrial enlargement and central venous prominence. Patchy three-vessel coronary artery calcification. Enlarged pulmonary trunk again measures 3.6 cm indicating arterial hypertension. There is no central embolus on this nontailored exam. There is atherosclerosis in the aorta and great vessels without aneurysm, stenosis or dissection. No pericardial effusion. Mediastinum/Nodes: Small small hiatal hernia. Unremarkable esophagus. Thyroid  gland is not seen and could be removed, ablated or extremely atrophic. There is no intrathoracic or axillary adenopathy. The trachea is ectatic but clear. There is tracheobronchial chondrocalcinosis. Lungs/Pleura: There is biapical symmetric pleuroparenchymal scarring, calcifications and bronchiolectasis. There is mild centrilobular emphysema in the upper lobes, and subpleural reticulation in the lung fields with an upper lobe predominance consistent with chronic interstitial lung disease. There is increased consolidation posteriorly in the right middle lobe which is most likely either due to pneumonia or aspiration. There is increased posterior atelectasis in the basilar lower lobes. Interval new 6 mm subpleural left upper lobe nodule on 5:55. There is a new pleural-based 5 mm left lower lobe nodule on 5:117. There is new branching nodularity in the posterolateral left base consistent with small airway impactions. Similar new branching nodularity right lower lobe on 5:112, anterior left upper lobe on 5:48 and in the lingula on 5:75. There is no pleural effusion.  No pneumothorax. Musculoskeletal:  Osteopenia, mild kyphosis and degenerative change thoracic spine. No acute or significant osseous findings. The CT ABDOMEN AND PELVIS FINDINGS Hepatobiliary: No focal liver abnormality is seen. No gallstones, gallbladder wall thickening, or biliary dilatation. Pancreas: Unremarkable. No pancreatic ductal dilatation or surrounding inflammatory changes. Spleen: Normal in size without focal abnormality. Adrenals/Urinary Tract: No adrenal mass is seen. There is no renal mass enhancement. Stable 1 cm Bosniak 1 cyst anterior right kidney. Occasional subcentimeter Bosniak 2 cortical cysts of both kidneys are too small to characterize. There is cortical thinning in both kidneys. No urinary stone or obstruction. The bladder wall is unremarkable, but the bladder is displaced into the left hemipelvis and mildly compressed by a sizable right perivesical extraperitoneal hematoma. No contrast extravasation outside of the bladder is seen. Stomach/Bowel: Prior partial right hemicolectomy with primary ileocolic anastomosis. No dilatation or overt wall thickening. Advanced sigmoid diverticulosis without evidence of diverticulitis. Vascular/Lymphatic: Large hematoma in the right hemipelvis along side the bladder, measuring 9.4 x 6.7 x 9.4 cm with active bleeding into the hematoma and scattered contrast levels on the delayed exam. On the initial arterial phase images there is curvilinear contrast within the collection, but it could not be traced back to a specific vessel. Additional free hemoperitoneum collects above the bladder dome to the left and right. Reproductive: Status post hysterectomy. No adnexal masses. Other: Free hemorrhage above the bladder and hematoma to the right of the bladder as above. No free air. Musculoskeletal: Comminuted fracture, not significantly displaced involving medial aspect of the right superior pubic ramus and the mid to medial aspect of the right inferior pubic ramus, with swelling in the adjacent  right obturator internus and externus. There is osteopenia, degenerative and postsurgical change of the spine.  No other regional skeletal fracture is evident. IMPRESSION: 1. Hematoma in the right hemipelvis along side the bladder, measuring 9.4 x 6.7 x 9.4 cm, with active bleeding into the hematoma and scattered contrast levels on the delayed exam. The active bleeding could not be traced back to a specific vessel. 2. Comminuted fractures of the right superior and inferior pubic rami. 3. No urinary bladder extravasation is seen. 4. No other acute trauma related findings in the chest, abdomen or pelvis. 5. Chronic interstitial lung disease with increased consolidation in the posterior right middle lobe which could be due to pneumonia or aspiration. 6. New 6 mm left upper lobe and 5 mm left lower lobe nodules. Follow-up as indicated taking into account age and life expectancy. 7. Scattered peripheral branching nodularity new from prior study consistent with small airway impactions. 8. Aortic and coronary artery atherosclerosis. 9. Cardiomegaly with chronic left atrial enlargement and central venous prominence. Tricuspid and mitral prosthesis. 10. Enlarged pulmonary trunk indicating arterial hypertension. 11. Small hiatal hernia. 12. Advanced sigmoid diverticulosis. 13. Osteopenia and degenerative change. 14. Emphysema. 15. Critical Value/emergent results were called by telephone at the time of interpretation on 11/17/2023 at 4:09 am to provider [facility] , who verbally acknowledged these results. Aortic Atherosclerosis (ICD10-I70.0) and Emphysema (ICD10-J43.9). Electronically Signed   By: Francis Quam M.D.   On: 11/17/2023 04:17   CT ABDOMEN PELVIS W WO CONTRAST Result Date: 11/17/2023 CLINICAL DATA:  Fall, with suspected acute comminuted right inferior pubic ramus fracture, concern is for urinary injury. Blunt polytrauma with potential for acute chest trauma as well. EXAM: CT CHEST WITH CONTRAST CT ABDOMEN AND  PELVIS WITH AND WITHOUT CONTRAST TECHNIQUE: Multidetector CT imaging of the chest was performed during intravenous contrast administration. Multidetector CT imaging of the abdomen and pelvis was performed prior to and following the standard protocol before and during bolus administration of intravenous contrast. RADIATION DOSE REDUCTION: This exam was performed according to the departmental dose-optimization program which includes automated exposure control, adjustment of the mA and/or kV according to patient size and/or use of iterative reconstruction technique. CONTRAST:  OMNIPAQUE  IOHEXOL  350 MG/ML SOLN COMPARISON:  Most recent chest x-ray was PA Lat chest 09/23/2023, before that chest radiograph 08/06/2022. Comparison is made with the chest, abdomen and pelvis CT with contrast 03/31/2021, abdomen and pelvis CT with IV contrast 03/06/2022. FINDINGS: CT CHEST FINDINGS Cardiovascular: The heart is slightly enlarged. Again noted are sternotomy sutures with prosthetic tricuspid and mitral valves. There is chronic left atrial enlargement and central venous prominence. Patchy three-vessel coronary artery calcification. Enlarged pulmonary trunk again measures 3.6 cm indicating arterial hypertension. There is no central embolus on this nontailored exam. There is atherosclerosis in the aorta and great vessels without aneurysm, stenosis or dissection. No pericardial effusion. Mediastinum/Nodes: Small small hiatal hernia. Unremarkable esophagus. Thyroid  gland is not seen and could be removed, ablated or extremely atrophic. There is no intrathoracic or axillary adenopathy. The trachea is ectatic but clear. There is tracheobronchial chondrocalcinosis. Lungs/Pleura: There is biapical symmetric pleuroparenchymal scarring, calcifications and bronchiolectasis. There is mild centrilobular emphysema in the upper lobes, and subpleural reticulation in the lung fields with an upper lobe predominance consistent with chronic  interstitial lung disease. There is increased consolidation posteriorly in the right middle lobe which is most likely either due to pneumonia or aspiration. There is increased posterior atelectasis in the basilar lower lobes. Interval new 6 mm subpleural left upper lobe nodule on 5:55. There is a new pleural-based 5 mm left lower  lobe nodule on 5:117. There is new branching nodularity in the posterolateral left base consistent with small airway impactions. Similar new branching nodularity right lower lobe on 5:112, anterior left upper lobe on 5:48 and in the lingula on 5:75. There is no pleural effusion.  No pneumothorax. Musculoskeletal: Osteopenia, mild kyphosis and degenerative change thoracic spine. No acute or significant osseous findings. The CT ABDOMEN AND PELVIS FINDINGS Hepatobiliary: No focal liver abnormality is seen. No gallstones, gallbladder wall thickening, or biliary dilatation. Pancreas: Unremarkable. No pancreatic ductal dilatation or surrounding inflammatory changes. Spleen: Normal in size without focal abnormality. Adrenals/Urinary Tract: No adrenal mass is seen. There is no renal mass enhancement. Stable 1 cm Bosniak 1 cyst anterior right kidney. Occasional subcentimeter Bosniak 2 cortical cysts of both kidneys are too small to characterize. There is cortical thinning in both kidneys. No urinary stone or obstruction. The bladder wall is unremarkable, but the bladder is displaced into the left hemipelvis and mildly compressed by a sizable right perivesical extraperitoneal hematoma. No contrast extravasation outside of the bladder is seen. Stomach/Bowel: Prior partial right hemicolectomy with primary ileocolic anastomosis. No dilatation or overt wall thickening. Advanced sigmoid diverticulosis without evidence of diverticulitis. Vascular/Lymphatic: Large hematoma in the right hemipelvis along side the bladder, measuring 9.4 x 6.7 x 9.4 cm with active bleeding into the hematoma and scattered  contrast levels on the delayed exam. On the initial arterial phase images there is curvilinear contrast within the collection, but it could not be traced back to a specific vessel. Additional free hemoperitoneum collects above the bladder dome to the left and right. Reproductive: Status post hysterectomy. No adnexal masses. Other: Free hemorrhage above the bladder and hematoma to the right of the bladder as above. No free air. Musculoskeletal: Comminuted fracture, not significantly displaced involving medial aspect of the right superior pubic ramus and the mid to medial aspect of the right inferior pubic ramus, with swelling in the adjacent right obturator internus and externus. There is osteopenia, degenerative and postsurgical change of the spine. No other regional skeletal fracture is evident. IMPRESSION: 1. Hematoma in the right hemipelvis along side the bladder, measuring 9.4 x 6.7 x 9.4 cm, with active bleeding into the hematoma and scattered contrast levels on the delayed exam. The active bleeding could not be traced back to a specific vessel. 2. Comminuted fractures of the right superior and inferior pubic rami. 3. No urinary bladder extravasation is seen. 4. No other acute trauma related findings in the chest, abdomen or pelvis. 5. Chronic interstitial lung disease with increased consolidation in the posterior right middle lobe which could be due to pneumonia or aspiration. 6. New 6 mm left upper lobe and 5 mm left lower lobe nodules. Follow-up as indicated taking into account age and life expectancy. 7. Scattered peripheral branching nodularity new from prior study consistent with small airway impactions. 8. Aortic and coronary artery atherosclerosis. 9. Cardiomegaly with chronic left atrial enlargement and central venous prominence. Tricuspid and mitral prosthesis. 10. Enlarged pulmonary trunk indicating arterial hypertension. 11. Small hiatal hernia. 12. Advanced sigmoid diverticulosis. 13. Osteopenia  and degenerative change. 14. Emphysema. 15. Critical Value/emergent results were called by telephone at the time of interpretation on 11/17/2023 at 4:09 am to provider [facility] , who verbally acknowledged these results. Aortic Atherosclerosis (ICD10-I70.0) and Emphysema (ICD10-J43.9). Electronically Signed   By: Francis Quam M.D.   On: 11/17/2023 04:17   CT PELVIS WO CONTRAST Result Date: 11/17/2023 CLINICAL DATA:  Hip trauma, fracture suspected, xray done EXAM:  CT PELVIS WITHOUT CONTRAST TECHNIQUE: Multidetector CT imaging of the pelvis was performed following the standard protocol without intravenous contrast. RADIATION DOSE REDUCTION: This exam was performed according to the departmental dose-optimization program which includes automated exposure control, adjustment of the mA and/or kV according to patient size and/or use of iterative reconstruction technique. COMPARISON:  X-ray right hip 11/16/2023 FINDINGS: Urinary Tract:  Irregular right urinary bladder wall thick. Bowel: Colonic diverticulosis. Unremarkable visualized pelvic bowel loops. Vascular/Lymphatic: No pathologically enlarged lymph nodes. No significant vascular abnormality seen. Reproductive:  No mass or other significant abnormality Other: Large volume right space of Rezius high density free fluid. No intraperitoneal free gas. No organized fluid collection. Musculoskeletal: Acute comminuted and displaced right inferior pubic rami and superior pubic rami/pubic symphysis fracture. Acute nondisplaced right iliac fracture (9:22; 4:50). Asymmetric and irregular right internal and external obturator musculature. IMPRESSION: 1. Acute comminuted and displaced right inferior pubic rami and superior pubic rami/pubic symphysis fracture. 2. Acute nondisplaced right iliac fracture. 3. Large volume right extraperitoneal hematoma with likely associated extraperitoneal urinary bladder injury. Intraperitoneal urinary bladder injury not excluded. Recommend CT  urogram. 4. Right internal and external obturator intramuscular hematoma. Electronically Signed   By: Morgane  Naveau M.D.   On: 11/17/2023 00:53   CT Thoracic Spine Wo Contrast Result Date: 11/17/2023 CLINICAL DATA:  Back trauma, no prior imaging (Age >= 16y) EXAM: CT THORACIC AND LUMBAR SPINE WITHOUT CONTRAST TECHNIQUE: Multidetector CT imaging of the thoracic and lumbar spine was performed without contrast. Multiplanar CT image reconstructions were also generated. RADIATION DOSE REDUCTION: This exam was performed according to the departmental dose-optimization program which includes automated exposure control, adjustment of the mA and/or kV according to patient size and/or use of iterative reconstruction technique. COMPARISON:  MR lumbar spine 04/23/2018 FINDINGS: CT THORACIC SPINE FINDINGS Alignment: Grade 1 anterolisthesis of T1 on T2, T2 on T3, T3 on T4, T4 on T5.\. Vertebrae: No acute fracture or focal pathologic process. Paraspinal and other soft tissues: Negative. Disc levels: Multilevel intervertebral disc space vacuum phenomenon. CT LUMBAR SPINE FINDINGS Segmentation: 5 lumbar type vertebrae. Alignment: Stable grade 1 anterolisthesis of L3 on L4 and L4 on L5. Vertebrae: L3-L5 posterolateral surgical hardware. No acute fracture or focal pathologic process. Paraspinal and other soft tissues: Negative. Disc levels: Multilevel intervertebral disc space vacuum phenomenon. Other: Coarsened interlobular softball thickening. Peripheral reticulations. Atherosclerotic plaque. Colonic diverticulosis. IMPRESSION: CT THORACIC SPINE IMPRESSION No acute displaced fracture or traumatic listhesis of the thoracic spine. CT LUMBAR SPINE IMPRESSION No acute displaced fracture or traumatic listhesis of the lumbar spine. Aortic Atherosclerosis (ICD10-I70.0). Electronically Signed   By: Morgane  Naveau M.D.   On: 11/17/2023 00:47   CT Lumbar Spine Wo Contrast Result Date: 11/17/2023 CLINICAL DATA:  Back trauma, no prior  imaging (Age >= 16y) EXAM: CT THORACIC AND LUMBAR SPINE WITHOUT CONTRAST TECHNIQUE: Multidetector CT imaging of the thoracic and lumbar spine was performed without contrast. Multiplanar CT image reconstructions were also generated. RADIATION DOSE REDUCTION: This exam was performed according to the departmental dose-optimization program which includes automated exposure control, adjustment of the mA and/or kV according to patient size and/or use of iterative reconstruction technique. COMPARISON:  MR lumbar spine 04/23/2018 FINDINGS: CT THORACIC SPINE FINDINGS Alignment: Grade 1 anterolisthesis of T1 on T2, T2 on T3, T3 on T4, T4 on T5.\. Vertebrae: No acute fracture or focal pathologic process. Paraspinal and other soft tissues: Negative. Disc levels: Multilevel intervertebral disc space vacuum phenomenon. CT LUMBAR SPINE FINDINGS Segmentation: 5 lumbar type vertebrae. Alignment:  Stable grade 1 anterolisthesis of L3 on L4 and L4 on L5. Vertebrae: L3-L5 posterolateral surgical hardware. No acute fracture or focal pathologic process. Paraspinal and other soft tissues: Negative. Disc levels: Multilevel intervertebral disc space vacuum phenomenon. Other: Coarsened interlobular softball thickening. Peripheral reticulations. Atherosclerotic plaque. Colonic diverticulosis. IMPRESSION: CT THORACIC SPINE IMPRESSION No acute displaced fracture or traumatic listhesis of the thoracic spine. CT LUMBAR SPINE IMPRESSION No acute displaced fracture or traumatic listhesis of the lumbar spine. Aortic Atherosclerosis (ICD10-I70.0). Electronically Signed   By: Morgane  Naveau M.D.   On: 11/17/2023 00:47   CT Head Wo Contrast Result Date: 11/16/2023 CLINICAL DATA:  Head trauma, minor (Age >= 65y); Neck trauma (Age >= 65y). Fall EXAM: CT HEAD WITHOUT CONTRAST CT CERVICAL SPINE WITHOUT CONTRAST TECHNIQUE: Multidetector CT imaging of the head and cervical spine was performed following the standard protocol without intravenous contrast.  Multiplanar CT image reconstructions of the cervical spine were also generated. RADIATION DOSE REDUCTION: This exam was performed according to the departmental dose-optimization program which includes automated exposure control, adjustment of the mA and/or kV according to patient size and/or use of iterative reconstruction technique. COMPARISON:  CT head 12/06/2021, CT head and C-spine 10/22/2021 FINDINGS: CT HEAD FINDINGS Brain: Cerebral ventricle sizes are concordant with the degree of cerebral volume loss. Patchy and confluent areas of decreased attenuation are noted throughout the deep and periventricular white matter of the cerebral hemispheres bilaterally, compatible with chronic microvascular ischemic disease. No evidence of large-territorial acute infarction. No parenchymal hemorrhage. No mass lesion. No extra-axial collection. No mass effect or midline shift. No hydrocephalus. Basilar cisterns are patent. Vascular: No hyperdense vessel. Atherosclerotic calcifications are present within the cavernous internal carotid and vertebral arteries. Skull: Old healed right orbital floor fracture. No acute fracture or focal lesion. Sinuses/Orbits: Paranasal sinuses and mastoid air cells are clear. Bilateral lens replacement. Otherwise the orbits are unremarkable. Other: None. CT CERVICAL SPINE FINDINGS Alignment: Grade 1 anterolisthesis of C2 on C3, grade 1 anterolisthesis of C3 on C4. Retrolisthesis of C4 on C5 and C5 on C6. Skull base and vertebrae: Multilevel severe degenerative change of the spine with associated severe osseous neural foraminal stenosis of the right C4-C5 and C5-C6 level. No severe osseous central canal stenosis. No acute fracture. No aggressive appearing focal osseous lesion or focal pathologic process. Soft tissues and spinal canal: No prevertebral fluid or swelling. No visible canal hematoma. Upper chest: Biapical pleural/pulmonary scarring with associated calcifications on the left. Chronic  coarsened interstitial markings. Other: Atherosclerotic plaque of the carotid arteries within the neck. IMPRESSION: 1. No acute intracranial abnormality. 2. No acute displaced fracture or traumatic listhesis of the cervical spine. 3. Multilevel severe degenerative change of the spine with associated severe osseous neural foraminal stenosis of the right C4-C5 and C5-C6 level. Electronically Signed   By: Morgane  Naveau M.D.   On: 11/16/2023 23:11   CT Cervical Spine Wo Contrast Result Date: 11/16/2023 CLINICAL DATA:  Head trauma, minor (Age >= 65y); Neck trauma (Age >= 65y). Fall EXAM: CT HEAD WITHOUT CONTRAST CT CERVICAL SPINE WITHOUT CONTRAST TECHNIQUE: Multidetector CT imaging of the head and cervical spine was performed following the standard protocol without intravenous contrast. Multiplanar CT image reconstructions of the cervical spine were also generated. RADIATION DOSE REDUCTION: This exam was performed according to the departmental dose-optimization program which includes automated exposure control, adjustment of the mA and/or kV according to patient size and/or use of iterative reconstruction technique. COMPARISON:  CT head 12/06/2021, CT head and C-spine 10/22/2021  FINDINGS: CT HEAD FINDINGS Brain: Cerebral ventricle sizes are concordant with the degree of cerebral volume loss. Patchy and confluent areas of decreased attenuation are noted throughout the deep and periventricular white matter of the cerebral hemispheres bilaterally, compatible with chronic microvascular ischemic disease. No evidence of large-territorial acute infarction. No parenchymal hemorrhage. No mass lesion. No extra-axial collection. No mass effect or midline shift. No hydrocephalus. Basilar cisterns are patent. Vascular: No hyperdense vessel. Atherosclerotic calcifications are present within the cavernous internal carotid and vertebral arteries. Skull: Old healed right orbital floor fracture. No acute fracture or focal lesion.  Sinuses/Orbits: Paranasal sinuses and mastoid air cells are clear. Bilateral lens replacement. Otherwise the orbits are unremarkable. Other: None. CT CERVICAL SPINE FINDINGS Alignment: Grade 1 anterolisthesis of C2 on C3, grade 1 anterolisthesis of C3 on C4. Retrolisthesis of C4 on C5 and C5 on C6. Skull base and vertebrae: Multilevel severe degenerative change of the spine with associated severe osseous neural foraminal stenosis of the right C4-C5 and C5-C6 level. No severe osseous central canal stenosis. No acute fracture. No aggressive appearing focal osseous lesion or focal pathologic process. Soft tissues and spinal canal: No prevertebral fluid or swelling. No visible canal hematoma. Upper chest: Biapical pleural/pulmonary scarring with associated calcifications on the left. Chronic coarsened interstitial markings. Other: Atherosclerotic plaque of the carotid arteries within the neck. IMPRESSION: 1. No acute intracranial abnormality. 2. No acute displaced fracture or traumatic listhesis of the cervical spine. 3. Multilevel severe degenerative change of the spine with associated severe osseous neural foraminal stenosis of the right C4-C5 and C5-C6 level. Electronically Signed   By: Morgane  Naveau M.D.   On: 11/16/2023 23:11   DG Hip Unilat With Pelvis 2-3 Views Right Result Date: 11/16/2023 CLINICAL DATA:  fall, pain EXAM: DG HIP (WITH OR WITHOUT PELVIS) 2-3V RIGHT COMPARISON:  CT abdomen pelvis 03/06/2022 FINDINGS: Cortical irregularity with likely acute displaced and comminuted right inferior pubic rami fracture. There is no evidence of hip fracture or dislocation of the right hip. No acute displaced fracture or dislocation of the left hip. No diastasis of the bones of the pelvis. Surgical hardware noted along the lower visualized lumbar spine. There is no evidence of arthropathy or other focal bone abnormality. Vascular calcifications. IMPRESSION: Cortical irregularity with likely acute displaced and  comminuted right inferior pubic rami fracture. Electronically Signed   By: Morgane  Naveau M.D.   On: 11/16/2023 23:05    Assessment/Plan There are no diagnoses linked to this encounter.   Family/ staff Communication:   Labs/tests ordered:

## 2023-11-27 DIAGNOSIS — M25551 Pain in right hip: Secondary | ICD-10-CM | POA: Diagnosis not present

## 2023-11-27 DIAGNOSIS — R2681 Unsteadiness on feet: Secondary | ICD-10-CM | POA: Diagnosis not present

## 2023-11-27 DIAGNOSIS — R41841 Cognitive communication deficit: Secondary | ICD-10-CM | POA: Diagnosis not present

## 2023-11-27 DIAGNOSIS — S32810S Multiple fractures of pelvis with stable disruption of pelvic ring, sequela: Secondary | ICD-10-CM | POA: Diagnosis not present

## 2023-11-27 DIAGNOSIS — R2689 Other abnormalities of gait and mobility: Secondary | ICD-10-CM | POA: Diagnosis not present

## 2023-11-27 DIAGNOSIS — M6281 Muscle weakness (generalized): Secondary | ICD-10-CM | POA: Diagnosis not present

## 2023-11-28 DIAGNOSIS — R2681 Unsteadiness on feet: Secondary | ICD-10-CM | POA: Diagnosis not present

## 2023-11-28 DIAGNOSIS — R41841 Cognitive communication deficit: Secondary | ICD-10-CM | POA: Diagnosis not present

## 2023-11-28 DIAGNOSIS — M25551 Pain in right hip: Secondary | ICD-10-CM | POA: Diagnosis not present

## 2023-11-28 DIAGNOSIS — S32810S Multiple fractures of pelvis with stable disruption of pelvic ring, sequela: Secondary | ICD-10-CM | POA: Diagnosis not present

## 2023-11-28 DIAGNOSIS — M6281 Muscle weakness (generalized): Secondary | ICD-10-CM | POA: Diagnosis not present

## 2023-11-28 DIAGNOSIS — R2689 Other abnormalities of gait and mobility: Secondary | ICD-10-CM | POA: Diagnosis not present

## 2023-11-29 DIAGNOSIS — M25551 Pain in right hip: Secondary | ICD-10-CM | POA: Diagnosis not present

## 2023-11-29 DIAGNOSIS — S32810S Multiple fractures of pelvis with stable disruption of pelvic ring, sequela: Secondary | ICD-10-CM | POA: Diagnosis not present

## 2023-11-29 DIAGNOSIS — R41841 Cognitive communication deficit: Secondary | ICD-10-CM | POA: Diagnosis not present

## 2023-11-29 DIAGNOSIS — M6281 Muscle weakness (generalized): Secondary | ICD-10-CM | POA: Diagnosis not present

## 2023-11-29 DIAGNOSIS — R2681 Unsteadiness on feet: Secondary | ICD-10-CM | POA: Diagnosis not present

## 2023-11-29 DIAGNOSIS — R2689 Other abnormalities of gait and mobility: Secondary | ICD-10-CM | POA: Diagnosis not present

## 2023-12-02 ENCOUNTER — Non-Acute Institutional Stay (SKILLED_NURSING_FACILITY): Payer: Self-pay | Admitting: Internal Medicine

## 2023-12-02 ENCOUNTER — Encounter: Payer: Self-pay | Admitting: Internal Medicine

## 2023-12-02 DIAGNOSIS — I5032 Chronic diastolic (congestive) heart failure: Secondary | ICD-10-CM | POA: Diagnosis not present

## 2023-12-02 DIAGNOSIS — D509 Iron deficiency anemia, unspecified: Secondary | ICD-10-CM

## 2023-12-02 DIAGNOSIS — S3282XD Multiple fractures of pelvis without disruption of pelvic ring, subsequent encounter for fracture with routine healing: Secondary | ICD-10-CM | POA: Diagnosis not present

## 2023-12-02 DIAGNOSIS — E782 Mixed hyperlipidemia: Secondary | ICD-10-CM | POA: Diagnosis not present

## 2023-12-02 DIAGNOSIS — M6281 Muscle weakness (generalized): Secondary | ICD-10-CM | POA: Diagnosis not present

## 2023-12-02 DIAGNOSIS — S32810S Multiple fractures of pelvis with stable disruption of pelvic ring, sequela: Secondary | ICD-10-CM | POA: Diagnosis not present

## 2023-12-02 DIAGNOSIS — M06049 Rheumatoid arthritis without rheumatoid factor, unspecified hand: Secondary | ICD-10-CM

## 2023-12-02 DIAGNOSIS — R41841 Cognitive communication deficit: Secondary | ICD-10-CM | POA: Diagnosis not present

## 2023-12-02 DIAGNOSIS — I4821 Permanent atrial fibrillation: Secondary | ICD-10-CM | POA: Diagnosis not present

## 2023-12-02 DIAGNOSIS — M25551 Pain in right hip: Secondary | ICD-10-CM | POA: Diagnosis not present

## 2023-12-02 DIAGNOSIS — J849 Interstitial pulmonary disease, unspecified: Secondary | ICD-10-CM | POA: Diagnosis not present

## 2023-12-02 DIAGNOSIS — R2681 Unsteadiness on feet: Secondary | ICD-10-CM | POA: Diagnosis not present

## 2023-12-02 DIAGNOSIS — R2689 Other abnormalities of gait and mobility: Secondary | ICD-10-CM | POA: Diagnosis not present

## 2023-12-02 NOTE — Progress Notes (Signed)
 Provider:  Charlanne Fredia CROME, MD  Location:  [facility] Room Number: 129 P Place of Service:  SNF [phone])  PCP: Charlanne Fredia CROME, MD Patient Care Team: Charlanne Fredia CROME, MD as PCP - General (Internal Medicine) Croitoru, Jerel, MD as PCP - Cardiology (Cardiology) [name], DO as Consulting Physician (Neurology)  Extended Emergency Contact Information Primary Emergency Contact: [name],[name] Address: [address]          [place], [place] [address] [place] Home Phone: [phone] Mobile Phone: [phone] Relation: Daughter Secondary Emergency Contact: [name] Address: [address]          [place], [place] [address] [place] Phone: [phone] Relation: Son  Code Status: DNR Goals of Care: Advanced Directive information    11/26/2023    2:37 PM  Advanced Directives  Does Patient Have a Medical Advance Directive? Yes  Type of Estate agent of [place];Living will  Does patient want to make changes to medical advance directive? No - Patient declined  Copy of Healthcare Power of Attorney in Chart? Yes - validated most recent copy scanned in chart (See row information)      Chief Complaint  Patient presents with   Readmit To SNF    HPI: Patient is a 89 y.o. female seen today for Readmission to SNF Lives in SNF    Was admitted in the hospital from 8/23 to 8/27 after a fall in her room In the ED patient was found to have a fractures of right superior and inferior pubic rami.  She also was found to have an active bleeding hematoma in the right hemipelvis Patient underwent subselective embolization of right internal iliac artery with temporary embolization Her Eliquis  was stopped.  [facility] Her hemoglobin when discharge was 7.5 .  Since being here patient has made some improvement.  She is working with therapy.  Her pain seems to be controlled.  They  are using stand-up lift for her for her transfers.  Patient was short of breath and was on 2 L of oxygen but is now off it but still says gets short of breath with exertion Patient did not have any complaints per nurses she is able to use the wheelchair. Her other history Has A Fib was on Eliquis   Hypertension and HLD H/o TIA  Cerebral Amyloid angiopathy on MRI Colon Cancer treated with Immunotherapy No Surgery in Remission  Anemia  Cognitive impairment MMSE 15/30 Rheumatoid arthritis   H/o CHF  with Elevated PHT  Past Medical History:  Diagnosis Date   Allergy    seasonal   Anemia    Anxiety    Arthritis    Back   Asymptomatic menopausal state    Atrial fibrillation (HCC)    Cancer (HCC) 2006   Colon.  Basal Cell Skin cancer- right arm   Cataract    removed bilateral   Chronic atrial fibrillation (HCC)    Colon cancer (HCC)    Constipation due to pain medication therapy    after heart surgery   Deficiency of other specified B group vitamins    Diplopia    Disorientation, unspecified    Dysrhythmia    PAF   GERD (gastroesophageal reflux disease)    Heart murmur    Hyperlipidemia    Hypertension    Hypothyroidism    Malignant neoplasm of colon, unspecified (HCC)    Other specified diseases of blood and blood-forming organs  Other specified disorders of bone density and structure, unspecified forearm    Other thrombophilia (HCC)    per matrix   Overactive bladder    per Matrix   Personal history of other malignant neoplasm of large intestine    Presbycusis, bilateral    per matrix   RA (rheumatoid arthritis) (HCC)    Restless leg    Seizures (HCC)    after Heart Surgey   Stroke (HCC)    TIA- found by neurologist after    Valgus deformity, not elsewhere classified, right knee    per matrix   Vascular dementia, mild, without behavioral disturbance, psychotic disturbance, mood disturbance, and anxiety (HCC)    per matrix   Past Surgical History:  Procedure  Laterality Date   ABDOMINAL HYSTERECTOMY  1970   Partial    COLON RESECTION  2006   cancer   COLON SURGERY     COLONOSCOPY     EYE SURGERY Bilateral    Cataract   IR ANGIOGRAM PELVIS SELECTIVE OR SUPRASELECTIVE  11/17/2023   IR ANGIOGRAM SELECTIVE EACH ADDITIONAL VESSEL  11/17/2023   IR EMBO ART  VEN HEMORR LYMPH EXTRAV  INC GUIDE ROADMAPPING  11/17/2023   IR US  GUIDE VASC ACCESS RIGHT  11/17/2023   MAXIMUM ACCESS (MAS)POSTERIOR LUMBAR INTERBODY FUSION (PLIF) 2 LEVEL N/A 04/12/2015   Procedure: Lumbar Three-Five Decompression, Pedicle Screw Fixation, and Posteriolateral Arthrodesis;  Surgeon: Fairy Levels, MD;  Location: [facility];  Service: Neurosurgery;  Laterality: N/A;  L3-4 L4-5 Maximum access posterior lumbar fusion, possible interbodies and resection of synovial cyst at L4-5   MITRAL VALVE REPAIR  01/20/2013   Gore-tex cords to P1, P2, and P3. Magic suture to posterior medial commisure, #30 Physio 1 ring. Done in [place]    TONSILLECTOMY     about 1940   TRICUSPID VALVE SURGERY  01/20/2013   #28 TriAd ring done in [place]     reports that she has never smoked. She has been exposed to tobacco smoke. She has never used smokeless tobacco. She reports current alcohol use of about 1.0 standard drink of alcohol per week. She reports that she does not use drugs. Social History   Socioeconomic History   Marital status: Widowed    Spouse name: Not on file   Number of children: Not on file   Years of education: Not on file   Highest education level: Not on file  Occupational History   Occupation: elementary school teacher    Comment: retired   Tobacco Use   Smoking status: Never    Passive exposure: Past (mother smoked)   Smokeless tobacco: Never  Vaping Use   Vaping status: Never Used  Substance and Sexual Activity   Alcohol use: Yes    Alcohol/week: 1.0 standard drink of alcohol    Types: 1 Glasses of wine per week    Comment: 1-2 per week   Drug use: No   Sexual activity:  Not Currently  Other Topics Concern   Not on file  Social History Narrative   Social History      Diet? Healthy- low salt, sugar, fat      Do you drink/eat things with caffeine? On occasion      Marital status?        widow                        What year were you married? 1958      Do you live in  a house, apartment, assisted living, condo, trailer, etc.? apartment      Is it one or more stories? one      How many persons live in your home? one      Do you have any pets in your home? (please list) no      Highest level of education completed? 4 year college      Current or past profession: Insurance account manager      Do you exercise?             yes                         Type & how often? Classes- senior retirement community/ some walking      Advanced Directives      Do you have a living will?      Do you have a DNR form?                                  If not, do you want to discuss one?      Do you have signed POA/HPOA for forms?       Functional Status Completed by: Daughter, Adin Palin      Do you have difficulty bathing or dressing yourself? no      Do you have difficulty preparing food or eating? no      Do you have difficulty managing your medications? no      Do you have difficulty managing your finances? no      Do you have difficulty affording your medications? no   Right handed   At [facility]   Beautiful daughter    Social Drivers of Health   Financial Resource Strain: Low Risk  (01/28/2018)   Overall Financial Resource Strain (CARDIA)    Difficulty of Paying Living Expenses: Not hard at all  Food Insecurity: No Food Insecurity (11/18/2023)   Hunger Vital Sign    Worried About Running Out of Food in the Last Year: Never true    Ran Out of Food in the Last Year: Never true  Transportation Needs: No Transportation Needs (11/18/2023)   PRAPARE - Administrator, Civil Service (Medical): No    Lack of Transportation (Non-Medical): No   Physical Activity: Sufficiently Active (01/28/2018)   Exercise Vital Sign    Days of Exercise per Week: 6 days    Minutes of Exercise per Session: 30 min  Stress: No Stress Concern Present (01/28/2018)   [employer] of Occupational Health - Occupational Stress Questionnaire    Feeling of Stress : Only a little  Social Connections: Unknown (11/18/2023)   Social Connection and Isolation Panel    Frequency of Communication with Friends and Family: More than three times a week    Frequency of Social Gatherings with Friends and Family: More than three times a week    Attends Religious Services: Not on file    Active Member of Clubs or Organizations: Not on file    Attends Banker Meetings: Not on file    Marital Status: Widowed  Intimate Partner Violence: Not At Risk (11/18/2023)   Humiliation, Afraid, Rape, and Kick questionnaire    Fear of Current or Ex-Partner: No    Emotionally Abused: No    Physically Abused: No    Sexually Abused: No    Functional Status Survey:  Family History  Problem Relation Age of Onset   Lung disease Mother 54   Heart disease Father 32   Colon polyps Daughter    Alcohol abuse Son    Colon cancer Neg Hx    Stomach cancer Neg Hx    Rectal cancer Neg Hx    Crohn's disease Neg Hx    Esophageal cancer Neg Hx     Health Maintenance  Topic Date Due   Influenza Vaccine  10/25/2023   COVID-19 Vaccine (8 - 2025-26 season) 11/25/2023   Medicare Annual Wellness (AWV)  10/03/2024   DTaP/Tdap/Td (2 - Td or Tdap) 04/16/2027   Pneumococcal Vaccine: 50+ Years  Completed   DEXA SCAN  Completed   Zoster Vaccines- Shingrix  Completed   HPV VACCINES  Aged Out   Meningococcal B Vaccine  Aged Out    Allergies  Allergen Reactions   Claritin [Loratadine] Other (See Comments)    Listed on MAR Unknown reaction   Codeine Other (See Comments)    just don't take it well   Gabapentin  Other (See Comments)    Dizziness    Molds & Smuts Other  (See Comments)    Dust.  Reaction is not listed on MAR    Pollen Extract Swelling    Grass   Bee Venom Swelling and Rash   Wasp Venom Swelling and Rash    Outpatient Encounter Medications as of 12/02/2023  Medication Sig   acetaminophen  (TYLENOL ) 500 MG tablet Take 2 tablets (1,000 mg total) by mouth every 6 (six) hours as needed for mild pain (pain score 1-3) or fever.   amoxicillin (AMOXIL) 500 MG capsule Take 2,000 mg by mouth as needed.   Cholecalciferol  (VITAMIN D ) 50 MCG (2000 UT) CAPS Take 2,000 Units by mouth in the morning.   cloNIDine  (CATAPRES ) 0.1 MG tablet Take 1 tablet (0.1 mg total) by mouth daily as needed. If SBP >180 only   docusate sodium  (COLACE) 100 MG capsule Take 100 mg by mouth daily as needed for mild constipation.   ENBREL SURECLICK 50 MG/ML injection Inject 50 mg into the skin once a week. Tuesday   furosemide  (LASIX ) 20 MG tablet Take 1 tablet (20 mg total) by mouth every other day. Can take an additional does of lasix  for a weight gain 2-3 lbs overnight or increased swelling of the legs.   furosemide  (LASIX ) 20 MG tablet Take 20 mg by mouth as needed.   Iron , Ferrous Sulfate , 325 (65 Fe) MG TABS Take 325 mg by mouth daily.   leflunomide  (ARAVA ) 20 MG tablet Take 20 mg by mouth in the morning.   levalbuterol  (XOPENEX ) 0.63 MG/3ML nebulizer solution Take 3 mLs (0.63 mg total) by nebulization every 6 (six) hours as needed for wheezing or shortness of breath.   levothyroxine  (SYNTHROID ) 88 MCG tablet Take 88 mcg by mouth daily before breakfast.   melatonin 5 MG TABS Take 5 mg by mouth at bedtime.   methocarbamol  (ROBAXIN ) 500 MG tablet Take 1 tablet (500 mg total) by mouth every 8 (eight) hours as needed for muscle spasms (musculoskeletal pain).   metoprolol  tartrate (LOPRESSOR ) 100 MG tablet Take 150 mg by mouth daily. If heart rate <50 or SBP >180 or <100   Multiple Vitamin (MULTIVITAMIN ADULT PO) Take 1 tablet by mouth every morning.   Multiple Vitamins-Minerals  (PRESERVISION AREDS 2) CAPS Take 1 capsule by mouth in the morning and at bedtime.   omeprazole  (PRILOSEC) 40 MG capsule TAKE 1 CAPSULE DAILY  polyethylene glycol (MIRALAX  / GLYCOLAX ) 17 g packet Take 17 g by mouth daily as needed for moderate constipation (constipation).   traMADol  HCl 25 MG TABS Take 25 mg by mouth every 8 (eight) hours as needed.   traMADol  HCl 25 MG TABS Take 50 mg by mouth every 8 (eight) hours as needed.   UNABLE TO FIND Apply 1 Application topically every morning. JOBST HOSE NEED SIZE   valsartan  (DIOVAN ) 160 MG tablet Take 160 mg by mouth every evening.   vitamin B-12 (CYANOCOBALAMIN ) 1000 MCG tablet Take 1,000 mcg by mouth in the morning.   No facility-administered encounter medications on file as of 12/02/2023.    Review of Systems  Constitutional:  Negative for activity change and appetite change.  HENT: Negative.    Respiratory:  Positive for shortness of breath. Negative for cough.   Cardiovascular:  Positive for leg swelling.  Gastrointestinal:  Negative for constipation.  Genitourinary: Negative.   Musculoskeletal:  Positive for gait problem. Negative for arthralgias and myalgias.  Skin: Negative.   Neurological:  Negative for dizziness and weakness.  Psychiatric/Behavioral:  Positive for confusion. Negative for dysphoric mood and sleep disturbance.     Vitals:   12/02/23 0905  BP: 139/84  Pulse: (!) 102  Resp: 18  Temp: (!) 97.3 F (36.3 C)  SpO2: 94%  Weight: 168 lb (76.2 kg)  Height: 5' 2 (1.575 m)   Body mass index is 30.73 kg/m. Physical Exam Vitals reviewed.  Constitutional:      Appearance: Normal appearance.  HENT:     Head: Normocephalic.     Nose: Nose normal.     Mouth/Throat:     Mouth: Mucous membranes are moist.     Pharynx: Oropharynx is clear.  Eyes:     Pupils: Pupils are equal, round, and reactive to light.  Cardiovascular:     Rate and Rhythm: Normal rate. Rhythm irregular.     Pulses: Normal pulses.     Heart  sounds: Normal heart sounds. No murmur heard. Pulmonary:     Effort: Pulmonary effort is normal.     Breath sounds: Normal breath sounds.  Abdominal:     General: Abdomen is flat. Bowel sounds are normal.     Palpations: Abdomen is soft.  Musculoskeletal:        General: Swelling present.     Cervical back: Neck supple.  Skin:    General: Skin is warm.  Neurological:     General: No focal deficit present.     Mental Status: She is alert.  Psychiatric:        Mood and Affect: Mood normal.        Thought Content: Thought content normal.     Labs reviewed: Basic Metabolic Panel: Recent Labs    11/18/23 0649 11/19/23 0512 11/20/23 0426  NA 135 131* 131*  K 4.3 4.1 3.8  CL 104 100 97*  CO2 23 22 22   GLUCOSE 100* 123* 96  BUN 25* 35* 30*  CREATININE 0.89 0.98 0.88  CALCIUM  8.5* 8.6* 8.2*   Liver Function Tests: Recent Labs    12/31/22 0000 02/05/23 1146 08/02/23 0000 11/16/23 2210  AST 26 18 25  44*  ALT 36* 19 22 42  ALKPHOS 104 91 118 104  BILITOT  --  0.8  --  0.8  PROT  --  7.1  --  6.6  ALBUMIN 4.1 4.1  --  3.6   No results for input(s): LIPASE, AMYLASE in the last 8760 hours.  No results for input(s): AMMONIA in the last 8760 hours. CBC: Recent Labs    02/05/23 1146 04/04/23 0000 08/13/23 1130 11/16/23 2210 11/17/23 0350 11/18/23 1242 11/19/23 0512 11/20/23 0426 11/26/23 0000  WBC 7.6   < > 7.6 10.5   < > 10.2 9.9 8.6 7.6  NEUTROABS 4.5  --  4.3 6.3  --   --   --   --   --   HGB 11.5*   < > 11.8* 10.6*   < > 7.6* 7.3* 7.3* 7.5*  HCT 34.6*   < > 35.0* 32.1*   < > 22.7* 22.4* 22.1* 22*  MCV 102.4*  --  103.6* 105.6*   < > 105.1* 105.2* 105.7*  --   PLT 163   < > 177 196   < > 128* 138* 142* 231   < > = values in this interval not displayed.   Cardiac Enzymes: No results for input(s): CKTOTAL, CKMB, CKMBINDEX, TROPONINI in the last 8760 hours. BNP: Invalid input(s): POCBNP Lab Results  Component Value Date   HGBA1C 5.5  01/12/2013   Lab Results  Component Value Date   TSH 2.92 09/13/2023   Lab Results  Component Value Date   VITAMINB12 834 12/06/2021   Lab Results  Component Value Date   FOLATE 10.2 12/06/2021   Lab Results  Component Value Date   IRON  73 12/06/2021   TIBC 248 (L) 12/06/2021   FERRITIN 116 04/17/2022    Imaging and Procedures obtained prior to SNF admission: IR Angiogram Pelvis Selective Or Supraselective Result Date: 11/17/2023 INDICATION: Clemens, pelvic fracture with expanding hematoma and continued arterial extravasation noted on recent CT EXAM: ULTRASOUND GUIDANCE FOR VASCULAR ACCESS RIGHT INTERNAL ILIAC ARTERIOGRAPHY SUB SELECTIVE SECOND ORDER BRANCH EMBOLIZATION X2 MEDICATIONS: No periprocedural antibiotics were indicated ANESTHESIA/SEDATION: Intravenous Fentanyl  50mcg and Versed  1mg  were administered by RN during a total moderate (conscious) sedation time of 35 minutes; the patient's level of consciousness and physiological / cardiorespiratory status were monitored continuously by radiology RN under my direct supervision. PROCEDURE: Informed consent was obtained from the patient following explanation of the procedure, risks, benefits and alternatives. The patient understands, agrees and consents for the procedure. All questions were addressed. A time out was performed prior to the initiation of the procedure. Maximal barrier sterile technique utilized including caps, mask, sterile gowns, sterile gloves, large sterile drape, hand hygiene, and Betadine prep. Right common femoral artery accessed with a micropuncture set under real-time ultrasound guidance, exchanged for a 5 French vascular sheath, through which a 5 [place] Kumpe catheter was advanced and used to selectively catheterize the right internal iliac artery. Confirmatory arteriography was performed. A coaxial Renegade hi Flo microcatheter with a fathom 016 guidewire was advanced to selectively catheterize 2 branches of the anterior  division right internal iliac artery for selective embolization using Gelfoam slurry, to stasis of flow into peripheral branches. Follow-up arteriography was performed each level. No evidence of nontarget embolization. The catheters and sheath were then removed and hemostasis achieved with the aid of calc to device. The patient tolerated the procedure well. CONTRAST:  35 mL Omnipaque  300 IA FLUOROSCOPY: Radiation Exposure Index (as provided by the fluoroscopic device): 292 mGy Kerma COMPLICATIONS: None immediate. IMPRESSION: 1. Technically successful sub selective embolization of right internal iliac artery branches with temporary embolic (Gelfoam slurry) without immediate complication. Electronically Signed   By: JONETTA Faes M.D.   On: 11/17/2023 14:46   IR US  Guide Vasc Access Right Result Date: 11/17/2023 INDICATION: Clemens, pelvic  fracture with expanding hematoma and continued arterial extravasation noted on recent CT EXAM: ULTRASOUND GUIDANCE FOR VASCULAR ACCESS RIGHT INTERNAL ILIAC ARTERIOGRAPHY SUB SELECTIVE SECOND ORDER BRANCH EMBOLIZATION X2 MEDICATIONS: No periprocedural antibiotics were indicated ANESTHESIA/SEDATION: Intravenous Fentanyl  50mcg and Versed  1mg  were administered by RN during a total moderate (conscious) sedation time of 35 minutes; the patient's level of consciousness and physiological / cardiorespiratory status were monitored continuously by radiology RN under my direct supervision. PROCEDURE: Informed consent was obtained from the patient following explanation of the procedure, risks, benefits and alternatives. The patient understands, agrees and consents for the procedure. All questions were addressed. A time out was performed prior to the initiation of the procedure. Maximal barrier sterile technique utilized including caps, mask, sterile gowns, sterile gloves, large sterile drape, hand hygiene, and Betadine prep. Right common femoral artery accessed with a micropuncture set under  real-time ultrasound guidance, exchanged for a 5 French vascular sheath, through which a 5 [place] Kumpe catheter was advanced and used to selectively catheterize the right internal iliac artery. Confirmatory arteriography was performed. A coaxial Renegade hi Flo microcatheter with a fathom 016 guidewire was advanced to selectively catheterize 2 branches of the anterior division right internal iliac artery for selective embolization using Gelfoam slurry, to stasis of flow into peripheral branches. Follow-up arteriography was performed each level. No evidence of nontarget embolization. The catheters and sheath were then removed and hemostasis achieved with the aid of calc to device. The patient tolerated the procedure well. CONTRAST:  35 mL Omnipaque  300 IA FLUOROSCOPY: Radiation Exposure Index (as provided by the fluoroscopic device): 292 mGy Kerma COMPLICATIONS: None immediate. IMPRESSION: 1. Technically successful sub selective embolization of right internal iliac artery branches with temporary embolic (Gelfoam slurry) without immediate complication. Electronically Signed   By: JONETTA Faes M.D.   On: 11/17/2023 14:46   IR EMBO ART  VEN HEMORR LYMPH EXTRAV  INC GUIDE ROADMAPPING Result Date: 11/17/2023 INDICATION: Clemens, pelvic fracture with expanding hematoma and continued arterial extravasation noted on recent CT EXAM: ULTRASOUND GUIDANCE FOR VASCULAR ACCESS RIGHT INTERNAL ILIAC ARTERIOGRAPHY SUB SELECTIVE SECOND ORDER BRANCH EMBOLIZATION X2 MEDICATIONS: No periprocedural antibiotics were indicated ANESTHESIA/SEDATION: Intravenous Fentanyl  50mcg and Versed  1mg  were administered by RN during a total moderate (conscious) sedation time of 35 minutes; the patient's level of consciousness and physiological / cardiorespiratory status were monitored continuously by radiology RN under my direct supervision. PROCEDURE: Informed consent was obtained from the patient following explanation of the procedure, risks, benefits  and alternatives. The patient understands, agrees and consents for the procedure. All questions were addressed. A time out was performed prior to the initiation of the procedure. Maximal barrier sterile technique utilized including caps, mask, sterile gowns, sterile gloves, large sterile drape, hand hygiene, and Betadine prep. Right common femoral artery accessed with a micropuncture set under real-time ultrasound guidance, exchanged for a 5 French vascular sheath, through which a 5 [place] Kumpe catheter was advanced and used to selectively catheterize the right internal iliac artery. Confirmatory arteriography was performed. A coaxial Renegade hi Flo microcatheter with a fathom 016 guidewire was advanced to selectively catheterize 2 branches of the anterior division right internal iliac artery for selective embolization using Gelfoam slurry, to stasis of flow into peripheral branches. Follow-up arteriography was performed each level. No evidence of nontarget embolization. The catheters and sheath were then removed and hemostasis achieved with the aid of calc to device. The patient tolerated the procedure well. CONTRAST:  35 mL Omnipaque  300 IA FLUOROSCOPY: Radiation Exposure Index (as  provided by the fluoroscopic device): 292 mGy Kerma COMPLICATIONS: None immediate. IMPRESSION: 1. Technically successful sub selective embolization of right internal iliac artery branches with temporary embolic (Gelfoam slurry) without immediate complication. Electronically Signed   By: JONETTA Faes M.D.   On: 11/17/2023 14:46   IR Angiogram Selective Each Additional Vessel Result Date: 11/17/2023 INDICATION: Clemens, pelvic fracture with expanding hematoma and continued arterial extravasation noted on recent CT EXAM: ULTRASOUND GUIDANCE FOR VASCULAR ACCESS RIGHT INTERNAL ILIAC ARTERIOGRAPHY SUB SELECTIVE SECOND ORDER BRANCH EMBOLIZATION X2 MEDICATIONS: No periprocedural antibiotics were indicated ANESTHESIA/SEDATION: Intravenous  Fentanyl  50mcg and Versed  1mg  were administered by RN during a total moderate (conscious) sedation time of 35 minutes; the patient's level of consciousness and physiological / cardiorespiratory status were monitored continuously by radiology RN under my direct supervision. PROCEDURE: Informed consent was obtained from the patient following explanation of the procedure, risks, benefits and alternatives. The patient understands, agrees and consents for the procedure. All questions were addressed. A time out was performed prior to the initiation of the procedure. Maximal barrier sterile technique utilized including caps, mask, sterile gowns, sterile gloves, large sterile drape, hand hygiene, and Betadine prep. Right common femoral artery accessed with a micropuncture set under real-time ultrasound guidance, exchanged for a 5 French vascular sheath, through which a 5 [place] Kumpe catheter was advanced and used to selectively catheterize the right internal iliac artery. Confirmatory arteriography was performed. A coaxial Renegade hi Flo microcatheter with a fathom 016 guidewire was advanced to selectively catheterize 2 branches of the anterior division right internal iliac artery for selective embolization using Gelfoam slurry, to stasis of flow into peripheral branches. Follow-up arteriography was performed each level. No evidence of nontarget embolization. The catheters and sheath were then removed and hemostasis achieved with the aid of calc to device. The patient tolerated the procedure well. CONTRAST:  35 mL Omnipaque  300 IA FLUOROSCOPY: Radiation Exposure Index (as provided by the fluoroscopic device): 292 mGy Kerma COMPLICATIONS: None immediate. IMPRESSION: 1. Technically successful sub selective embolization of right internal iliac artery branches with temporary embolic (Gelfoam slurry) without immediate complication. Electronically Signed   By: JONETTA Faes M.D.   On: 11/17/2023 14:46   CT CHEST W  CONTRAST Result Date: 11/17/2023 CLINICAL DATA:  Fall, with suspected acute comminuted right inferior pubic ramus fracture, concern is for urinary injury. Blunt polytrauma with potential for acute chest trauma as well. EXAM: CT CHEST WITH CONTRAST CT ABDOMEN AND PELVIS WITH AND WITHOUT CONTRAST TECHNIQUE: Multidetector CT imaging of the chest was performed during intravenous contrast administration. Multidetector CT imaging of the abdomen and pelvis was performed prior to and following the standard protocol before and during bolus administration of intravenous contrast. RADIATION DOSE REDUCTION: This exam was performed according to the departmental dose-optimization program which includes automated exposure control, adjustment of the mA and/or kV according to patient size and/or use of iterative reconstruction technique. CONTRAST:  OMNIPAQUE  IOHEXOL  350 MG/ML SOLN COMPARISON:  Most recent chest x-ray was PA Lat chest 09/23/2023, before that chest radiograph 08/06/2022. Comparison is made with the chest, abdomen and pelvis CT with contrast 03/31/2021, abdomen and pelvis CT with IV contrast 03/06/2022. FINDINGS: CT CHEST FINDINGS Cardiovascular: The heart is slightly enlarged. Again noted are sternotomy sutures with prosthetic tricuspid and mitral valves. There is chronic left atrial enlargement and central venous prominence. Patchy three-vessel coronary artery calcification. Enlarged pulmonary trunk again measures 3.6 cm indicating arterial hypertension. There is no central embolus on this nontailored exam. There is atherosclerosis in  the aorta and great vessels without aneurysm, stenosis or dissection. No pericardial effusion. Mediastinum/Nodes: Small small hiatal hernia. Unremarkable esophagus. Thyroid  gland is not seen and could be removed, ablated or extremely atrophic. There is no intrathoracic or axillary adenopathy. The trachea is ectatic but clear. There is tracheobronchial chondrocalcinosis.  Lungs/Pleura: There is biapical symmetric pleuroparenchymal scarring, calcifications and bronchiolectasis. There is mild centrilobular emphysema in the upper lobes, and subpleural reticulation in the lung fields with an upper lobe predominance consistent with chronic interstitial lung disease. There is increased consolidation posteriorly in the right middle lobe which is most likely either due to pneumonia or aspiration. There is increased posterior atelectasis in the basilar lower lobes. Interval new 6 mm subpleural left upper lobe nodule on 5:55. There is a new pleural-based 5 mm left lower lobe nodule on 5:117. There is new branching nodularity in the posterolateral left base consistent with small airway impactions. Similar new branching nodularity right lower lobe on 5:112, anterior left upper lobe on 5:48 and in the lingula on 5:75. There is no pleural effusion.  No pneumothorax. Musculoskeletal: Osteopenia, mild kyphosis and degenerative change thoracic spine. No acute or significant osseous findings. The CT ABDOMEN AND PELVIS FINDINGS Hepatobiliary: No focal liver abnormality is seen. No gallstones, gallbladder wall thickening, or biliary dilatation. Pancreas: Unremarkable. No pancreatic ductal dilatation or surrounding inflammatory changes. Spleen: Normal in size without focal abnormality. Adrenals/Urinary Tract: No adrenal mass is seen. There is no renal mass enhancement. Stable 1 cm Bosniak 1 cyst anterior right kidney. Occasional subcentimeter Bosniak 2 cortical cysts of both kidneys are too small to characterize. There is cortical thinning in both kidneys. No urinary stone or obstruction. The bladder wall is unremarkable, but the bladder is displaced into the left hemipelvis and mildly compressed by a sizable right perivesical extraperitoneal hematoma. No contrast extravasation outside of the bladder is seen. Stomach/Bowel: Prior partial right hemicolectomy with primary ileocolic anastomosis. No  dilatation or overt wall thickening. Advanced sigmoid diverticulosis without evidence of diverticulitis. Vascular/Lymphatic: Large hematoma in the right hemipelvis along side the bladder, measuring 9.4 x 6.7 x 9.4 cm with active bleeding into the hematoma and scattered contrast levels on the delayed exam. On the initial arterial phase images there is curvilinear contrast within the collection, but it could not be traced back to a specific vessel. Additional free hemoperitoneum collects above the bladder dome to the left and right. Reproductive: Status post hysterectomy. No adnexal masses. Other: Free hemorrhage above the bladder and hematoma to the right of the bladder as above. No free air. Musculoskeletal: Comminuted fracture, not significantly displaced involving medial aspect of the right superior pubic ramus and the mid to medial aspect of the right inferior pubic ramus, with swelling in the adjacent right obturator internus and externus. There is osteopenia, degenerative and postsurgical change of the spine. No other regional skeletal fracture is evident. IMPRESSION: 1. Hematoma in the right hemipelvis along side the bladder, measuring 9.4 x 6.7 x 9.4 cm, with active bleeding into the hematoma and scattered contrast levels on the delayed exam. The active bleeding could not be traced back to a specific vessel. 2. Comminuted fractures of the right superior and inferior pubic rami. 3. No urinary bladder extravasation is seen. 4. No other acute trauma related findings in the chest, abdomen or pelvis. 5. Chronic interstitial lung disease with increased consolidation in the posterior right middle lobe which could be due to pneumonia or aspiration. 6. New 6 mm left upper lobe and 5 mm left  lower lobe nodules. Follow-up as indicated taking into account age and life expectancy. 7. Scattered peripheral branching nodularity new from prior study consistent with small airway impactions. 8. Aortic and coronary artery  atherosclerosis. 9. Cardiomegaly with chronic left atrial enlargement and central venous prominence. Tricuspid and mitral prosthesis. 10. Enlarged pulmonary trunk indicating arterial hypertension. 11. Small hiatal hernia. 12. Advanced sigmoid diverticulosis. 13. Osteopenia and degenerative change. 14. Emphysema. 15. Critical Value/emergent results were called by telephone at the time of interpretation on 11/17/2023 at 4:09 am to provider [facility] , who verbally acknowledged these results. Aortic Atherosclerosis (ICD10-I70.0) and Emphysema (ICD10-J43.9). Electronically Signed   By: Francis Quam M.D.   On: 11/17/2023 04:17   CT ABDOMEN PELVIS W WO CONTRAST Result Date: 11/17/2023 CLINICAL DATA:  Fall, with suspected acute comminuted right inferior pubic ramus fracture, concern is for urinary injury. Blunt polytrauma with potential for acute chest trauma as well. EXAM: CT CHEST WITH CONTRAST CT ABDOMEN AND PELVIS WITH AND WITHOUT CONTRAST TECHNIQUE: Multidetector CT imaging of the chest was performed during intravenous contrast administration. Multidetector CT imaging of the abdomen and pelvis was performed prior to and following the standard protocol before and during bolus administration of intravenous contrast. RADIATION DOSE REDUCTION: This exam was performed according to the departmental dose-optimization program which includes automated exposure control, adjustment of the mA and/or kV according to patient size and/or use of iterative reconstruction technique. CONTRAST:  OMNIPAQUE  IOHEXOL  350 MG/ML SOLN COMPARISON:  Most recent chest x-ray was PA Lat chest 09/23/2023, before that chest radiograph 08/06/2022. Comparison is made with the chest, abdomen and pelvis CT with contrast 03/31/2021, abdomen and pelvis CT with IV contrast 03/06/2022. FINDINGS: CT CHEST FINDINGS Cardiovascular: The heart is slightly enlarged. Again noted are sternotomy sutures with prosthetic tricuspid and mitral valves. There  is chronic left atrial enlargement and central venous prominence. Patchy three-vessel coronary artery calcification. Enlarged pulmonary trunk again measures 3.6 cm indicating arterial hypertension. There is no central embolus on this nontailored exam. There is atherosclerosis in the aorta and great vessels without aneurysm, stenosis or dissection. No pericardial effusion. Mediastinum/Nodes: Small small hiatal hernia. Unremarkable esophagus. Thyroid  gland is not seen and could be removed, ablated or extremely atrophic. There is no intrathoracic or axillary adenopathy. The trachea is ectatic but clear. There is tracheobronchial chondrocalcinosis. Lungs/Pleura: There is biapical symmetric pleuroparenchymal scarring, calcifications and bronchiolectasis. There is mild centrilobular emphysema in the upper lobes, and subpleural reticulation in the lung fields with an upper lobe predominance consistent with chronic interstitial lung disease. There is increased consolidation posteriorly in the right middle lobe which is most likely either due to pneumonia or aspiration. There is increased posterior atelectasis in the basilar lower lobes. Interval new 6 mm subpleural left upper lobe nodule on 5:55. There is a new pleural-based 5 mm left lower lobe nodule on 5:117. There is new branching nodularity in the posterolateral left base consistent with small airway impactions. Similar new branching nodularity right lower lobe on 5:112, anterior left upper lobe on 5:48 and in the lingula on 5:75. There is no pleural effusion.  No pneumothorax. Musculoskeletal: Osteopenia, mild kyphosis and degenerative change thoracic spine. No acute or significant osseous findings. The CT ABDOMEN AND PELVIS FINDINGS Hepatobiliary: No focal liver abnormality is seen. No gallstones, gallbladder wall thickening, or biliary dilatation. Pancreas: Unremarkable. No pancreatic ductal dilatation or surrounding inflammatory changes. Spleen: Normal in size  without focal abnormality. Adrenals/Urinary Tract: No adrenal mass is seen. There is no renal mass enhancement.  Stable 1 cm Bosniak 1 cyst anterior right kidney. Occasional subcentimeter Bosniak 2 cortical cysts of both kidneys are too small to characterize. There is cortical thinning in both kidneys. No urinary stone or obstruction. The bladder wall is unremarkable, but the bladder is displaced into the left hemipelvis and mildly compressed by a sizable right perivesical extraperitoneal hematoma. No contrast extravasation outside of the bladder is seen. Stomach/Bowel: Prior partial right hemicolectomy with primary ileocolic anastomosis. No dilatation or overt wall thickening. Advanced sigmoid diverticulosis without evidence of diverticulitis. Vascular/Lymphatic: Large hematoma in the right hemipelvis along side the bladder, measuring 9.4 x 6.7 x 9.4 cm with active bleeding into the hematoma and scattered contrast levels on the delayed exam. On the initial arterial phase images there is curvilinear contrast within the collection, but it could not be traced back to a specific vessel. Additional free hemoperitoneum collects above the bladder dome to the left and right. Reproductive: Status post hysterectomy. No adnexal masses. Other: Free hemorrhage above the bladder and hematoma to the right of the bladder as above. No free air. Musculoskeletal: Comminuted fracture, not significantly displaced involving medial aspect of the right superior pubic ramus and the mid to medial aspect of the right inferior pubic ramus, with swelling in the adjacent right obturator internus and externus. There is osteopenia, degenerative and postsurgical change of the spine. No other regional skeletal fracture is evident. IMPRESSION: 1. Hematoma in the right hemipelvis along side the bladder, measuring 9.4 x 6.7 x 9.4 cm, with active bleeding into the hematoma and scattered contrast levels on the delayed exam. The active bleeding could not  be traced back to a specific vessel. 2. Comminuted fractures of the right superior and inferior pubic rami. 3. No urinary bladder extravasation is seen. 4. No other acute trauma related findings in the chest, abdomen or pelvis. 5. Chronic interstitial lung disease with increased consolidation in the posterior right middle lobe which could be due to pneumonia or aspiration. 6. New 6 mm left upper lobe and 5 mm left lower lobe nodules. Follow-up as indicated taking into account age and life expectancy. 7. Scattered peripheral branching nodularity new from prior study consistent with small airway impactions. 8. Aortic and coronary artery atherosclerosis. 9. Cardiomegaly with chronic left atrial enlargement and central venous prominence. Tricuspid and mitral prosthesis. 10. Enlarged pulmonary trunk indicating arterial hypertension. 11. Small hiatal hernia. 12. Advanced sigmoid diverticulosis. 13. Osteopenia and degenerative change. 14. Emphysema. 15. Critical Value/emergent results were called by telephone at the time of interpretation on 11/17/2023 at 4:09 am to provider [facility] , who verbally acknowledged these results. Aortic Atherosclerosis (ICD10-I70.0) and Emphysema (ICD10-J43.9). Electronically Signed   By: Francis Quam M.D.   On: 11/17/2023 04:17   CT PELVIS WO CONTRAST Result Date: 11/17/2023 CLINICAL DATA:  Hip trauma, fracture suspected, xray done EXAM: CT PELVIS WITHOUT CONTRAST TECHNIQUE: Multidetector CT imaging of the pelvis was performed following the standard protocol without intravenous contrast. RADIATION DOSE REDUCTION: This exam was performed according to the departmental dose-optimization program which includes automated exposure control, adjustment of the mA and/or kV according to patient size and/or use of iterative reconstruction technique. COMPARISON:  X-ray right hip 11/16/2023 FINDINGS: Urinary Tract:  Irregular right urinary bladder wall thick. Bowel: Colonic diverticulosis.  Unremarkable visualized pelvic bowel loops. Vascular/Lymphatic: No pathologically enlarged lymph nodes. No significant vascular abnormality seen. Reproductive:  No mass or other significant abnormality Other: Large volume right space of Rezius high density free fluid. No intraperitoneal free gas. No organized  fluid collection. Musculoskeletal: Acute comminuted and displaced right inferior pubic rami and superior pubic rami/pubic symphysis fracture. Acute nondisplaced right iliac fracture (9:22; 4:50). Asymmetric and irregular right internal and external obturator musculature. IMPRESSION: 1. Acute comminuted and displaced right inferior pubic rami and superior pubic rami/pubic symphysis fracture. 2. Acute nondisplaced right iliac fracture. 3. Large volume right extraperitoneal hematoma with likely associated extraperitoneal urinary bladder injury. Intraperitoneal urinary bladder injury not excluded. Recommend CT urogram. 4. Right internal and external obturator intramuscular hematoma. Electronically Signed   By: Morgane  Naveau M.D.   On: 11/17/2023 00:53   CT Thoracic Spine Wo Contrast Result Date: 11/17/2023 CLINICAL DATA:  Back trauma, no prior imaging (Age >= 16y) EXAM: CT THORACIC AND LUMBAR SPINE WITHOUT CONTRAST TECHNIQUE: Multidetector CT imaging of the thoracic and lumbar spine was performed without contrast. Multiplanar CT image reconstructions were also generated. RADIATION DOSE REDUCTION: This exam was performed according to the departmental dose-optimization program which includes automated exposure control, adjustment of the mA and/or kV according to patient size and/or use of iterative reconstruction technique. COMPARISON:  MR lumbar spine 04/23/2018 FINDINGS: CT THORACIC SPINE FINDINGS Alignment: Grade 1 anterolisthesis of T1 on T2, T2 on T3, T3 on T4, T4 on T5.\. Vertebrae: No acute fracture or focal pathologic process. Paraspinal and other soft tissues: Negative. Disc levels: Multilevel  intervertebral disc space vacuum phenomenon. CT LUMBAR SPINE FINDINGS Segmentation: 5 lumbar type vertebrae. Alignment: Stable grade 1 anterolisthesis of L3 on L4 and L4 on L5. Vertebrae: L3-L5 posterolateral surgical hardware. No acute fracture or focal pathologic process. Paraspinal and other soft tissues: Negative. Disc levels: Multilevel intervertebral disc space vacuum phenomenon. Other: Coarsened interlobular softball thickening. Peripheral reticulations. Atherosclerotic plaque. Colonic diverticulosis. IMPRESSION: CT THORACIC SPINE IMPRESSION No acute displaced fracture or traumatic listhesis of the thoracic spine. CT LUMBAR SPINE IMPRESSION No acute displaced fracture or traumatic listhesis of the lumbar spine. Aortic Atherosclerosis (ICD10-I70.0). Electronically Signed   By: Morgane  Naveau M.D.   On: 11/17/2023 00:47   CT Lumbar Spine Wo Contrast Result Date: 11/17/2023 CLINICAL DATA:  Back trauma, no prior imaging (Age >= 16y) EXAM: CT THORACIC AND LUMBAR SPINE WITHOUT CONTRAST TECHNIQUE: Multidetector CT imaging of the thoracic and lumbar spine was performed without contrast. Multiplanar CT image reconstructions were also generated. RADIATION DOSE REDUCTION: This exam was performed according to the departmental dose-optimization program which includes automated exposure control, adjustment of the mA and/or kV according to patient size and/or use of iterative reconstruction technique. COMPARISON:  MR lumbar spine 04/23/2018 FINDINGS: CT THORACIC SPINE FINDINGS Alignment: Grade 1 anterolisthesis of T1 on T2, T2 on T3, T3 on T4, T4 on T5.\. Vertebrae: No acute fracture or focal pathologic process. Paraspinal and other soft tissues: Negative. Disc levels: Multilevel intervertebral disc space vacuum phenomenon. CT LUMBAR SPINE FINDINGS Segmentation: 5 lumbar type vertebrae. Alignment: Stable grade 1 anterolisthesis of L3 on L4 and L4 on L5. Vertebrae: L3-L5 posterolateral surgical hardware. No acute  fracture or focal pathologic process. Paraspinal and other soft tissues: Negative. Disc levels: Multilevel intervertebral disc space vacuum phenomenon. Other: Coarsened interlobular softball thickening. Peripheral reticulations. Atherosclerotic plaque. Colonic diverticulosis. IMPRESSION: CT THORACIC SPINE IMPRESSION No acute displaced fracture or traumatic listhesis of the thoracic spine. CT LUMBAR SPINE IMPRESSION No acute displaced fracture or traumatic listhesis of the lumbar spine. Aortic Atherosclerosis (ICD10-I70.0). Electronically Signed   By: Morgane  Naveau M.D.   On: 11/17/2023 00:47   CT Head Wo Contrast Result Date: 11/16/2023 CLINICAL DATA:  Head trauma, minor (Age >=  65y); Neck trauma (Age >= 65y). Fall EXAM: CT HEAD WITHOUT CONTRAST CT CERVICAL SPINE WITHOUT CONTRAST TECHNIQUE: Multidetector CT imaging of the head and cervical spine was performed following the standard protocol without intravenous contrast. Multiplanar CT image reconstructions of the cervical spine were also generated. RADIATION DOSE REDUCTION: This exam was performed according to the departmental dose-optimization program which includes automated exposure control, adjustment of the mA and/or kV according to patient size and/or use of iterative reconstruction technique. COMPARISON:  CT head 12/06/2021, CT head and C-spine 10/22/2021 FINDINGS: CT HEAD FINDINGS Brain: Cerebral ventricle sizes are concordant with the degree of cerebral volume loss. Patchy and confluent areas of decreased attenuation are noted throughout the deep and periventricular white matter of the cerebral hemispheres bilaterally, compatible with chronic microvascular ischemic disease. No evidence of large-territorial acute infarction. No parenchymal hemorrhage. No mass lesion. No extra-axial collection. No mass effect or midline shift. No hydrocephalus. Basilar cisterns are patent. Vascular: No hyperdense vessel. Atherosclerotic calcifications are present within  the cavernous internal carotid and vertebral arteries. Skull: Old healed right orbital floor fracture. No acute fracture or focal lesion. Sinuses/Orbits: Paranasal sinuses and mastoid air cells are clear. Bilateral lens replacement. Otherwise the orbits are unremarkable. Other: None. CT CERVICAL SPINE FINDINGS Alignment: Grade 1 anterolisthesis of C2 on C3, grade 1 anterolisthesis of C3 on C4. Retrolisthesis of C4 on C5 and C5 on C6. Skull base and vertebrae: Multilevel severe degenerative change of the spine with associated severe osseous neural foraminal stenosis of the right C4-C5 and C5-C6 level. No severe osseous central canal stenosis. No acute fracture. No aggressive appearing focal osseous lesion or focal pathologic process. Soft tissues and spinal canal: No prevertebral fluid or swelling. No visible canal hematoma. Upper chest: Biapical pleural/pulmonary scarring with associated calcifications on the left. Chronic coarsened interstitial markings. Other: Atherosclerotic plaque of the carotid arteries within the neck. IMPRESSION: 1. No acute intracranial abnormality. 2. No acute displaced fracture or traumatic listhesis of the cervical spine. 3. Multilevel severe degenerative change of the spine with associated severe osseous neural foraminal stenosis of the right C4-C5 and C5-C6 level. Electronically Signed   By: Morgane  Naveau M.D.   On: 11/16/2023 23:11   CT Cervical Spine Wo Contrast Result Date: 11/16/2023 CLINICAL DATA:  Head trauma, minor (Age >= 65y); Neck trauma (Age >= 65y). Fall EXAM: CT HEAD WITHOUT CONTRAST CT CERVICAL SPINE WITHOUT CONTRAST TECHNIQUE: Multidetector CT imaging of the head and cervical spine was performed following the standard protocol without intravenous contrast. Multiplanar CT image reconstructions of the cervical spine were also generated. RADIATION DOSE REDUCTION: This exam was performed according to the departmental dose-optimization program which includes automated  exposure control, adjustment of the mA and/or kV according to patient size and/or use of iterative reconstruction technique. COMPARISON:  CT head 12/06/2021, CT head and C-spine 10/22/2021 FINDINGS: CT HEAD FINDINGS Brain: Cerebral ventricle sizes are concordant with the degree of cerebral volume loss. Patchy and confluent areas of decreased attenuation are noted throughout the deep and periventricular white matter of the cerebral hemispheres bilaterally, compatible with chronic microvascular ischemic disease. No evidence of large-territorial acute infarction. No parenchymal hemorrhage. No mass lesion. No extra-axial collection. No mass effect or midline shift. No hydrocephalus. Basilar cisterns are patent. Vascular: No hyperdense vessel. Atherosclerotic calcifications are present within the cavernous internal carotid and vertebral arteries. Skull: Old healed right orbital floor fracture. No acute fracture or focal lesion. Sinuses/Orbits: Paranasal sinuses and mastoid air cells are clear. Bilateral lens replacement. Otherwise the  orbits are unremarkable. Other: None. CT CERVICAL SPINE FINDINGS Alignment: Grade 1 anterolisthesis of C2 on C3, grade 1 anterolisthesis of C3 on C4. Retrolisthesis of C4 on C5 and C5 on C6. Skull base and vertebrae: Multilevel severe degenerative change of the spine with associated severe osseous neural foraminal stenosis of the right C4-C5 and C5-C6 level. No severe osseous central canal stenosis. No acute fracture. No aggressive appearing focal osseous lesion or focal pathologic process. Soft tissues and spinal canal: No prevertebral fluid or swelling. No visible canal hematoma. Upper chest: Biapical pleural/pulmonary scarring with associated calcifications on the left. Chronic coarsened interstitial markings. Other: Atherosclerotic plaque of the carotid arteries within the neck. IMPRESSION: 1. No acute intracranial abnormality. 2. No acute displaced fracture or traumatic listhesis of  the cervical spine. 3. Multilevel severe degenerative change of the spine with associated severe osseous neural foraminal stenosis of the right C4-C5 and C5-C6 level. Electronically Signed   By: Morgane  Naveau M.D.   On: 11/16/2023 23:11   DG Hip Unilat With Pelvis 2-3 Views Right Result Date: 11/16/2023 CLINICAL DATA:  fall, pain EXAM: DG HIP (WITH OR WITHOUT PELVIS) 2-3V RIGHT COMPARISON:  CT abdomen pelvis 03/06/2022 FINDINGS: Cortical irregularity with likely acute displaced and comminuted right inferior pubic rami fracture. There is no evidence of hip fracture or dislocation of the right hip. No acute displaced fracture or dislocation of the left hip. No diastasis of the bones of the pelvis. Surgical hardware noted along the lower visualized lumbar spine. There is no evidence of arthropathy or other focal bone abnormality. Vascular calcifications. IMPRESSION: Cortical irregularity with likely acute displaced and comminuted right inferior pubic rami fracture. Electronically Signed   By: Morgane  Naveau M.D.   On: 11/16/2023 23:05    Assessment/Plan 1. Iron  deficiency anemia, unspecified iron  deficiency anemia type (Primary) Worsen by Bleeding She is on Oral Iron  CBC Repeat if still low consider follow up with Hematology for IV iron   2. Chronic diastolic heart failure (HCC) On Lasix  QOD  3. Multiple closed fractures of pelvis without disruption of pelvic ring with routine healing, subsequent encounter Pain Controlled Working with therapy and seems to be making Progress WBAT 4. Rheumatoid arthritis involving hand with negative rheumatoid factor, unspecified laterality (HCC) Arava  and Enabrel  5. Permanent atrial fibrillation (HCC) Off Eliquis  now Has Appointment with Cardiology to decide how to Proceed Also on Metoprolol    6. Interstitial lung disease (HCC) Off Oxygen now 7 Hypothyroidism Last Tsh was 2.92 in 06/25 8 Essential hypertension Controlled on Valsartan  and Metoprolol  9  Vascular dementia without behavioral disturbance (HCC) Doing well in SNF 10 HLD Off statin Now 11 h/o Colon Cancer In Remission Family/ staff Communication:   Labs/tests ordered:CBC in AM

## 2023-12-03 ENCOUNTER — Ambulatory Visit: Admitting: Emergency Medicine

## 2023-12-03 DIAGNOSIS — R41841 Cognitive communication deficit: Secondary | ICD-10-CM | POA: Diagnosis not present

## 2023-12-03 DIAGNOSIS — D649 Anemia, unspecified: Secondary | ICD-10-CM | POA: Diagnosis not present

## 2023-12-03 DIAGNOSIS — R2689 Other abnormalities of gait and mobility: Secondary | ICD-10-CM | POA: Diagnosis not present

## 2023-12-03 DIAGNOSIS — M25551 Pain in right hip: Secondary | ICD-10-CM | POA: Diagnosis not present

## 2023-12-03 DIAGNOSIS — M6281 Muscle weakness (generalized): Secondary | ICD-10-CM | POA: Diagnosis not present

## 2023-12-03 DIAGNOSIS — R2681 Unsteadiness on feet: Secondary | ICD-10-CM | POA: Diagnosis not present

## 2023-12-03 DIAGNOSIS — S32810S Multiple fractures of pelvis with stable disruption of pelvic ring, sequela: Secondary | ICD-10-CM | POA: Diagnosis not present

## 2023-12-04 DIAGNOSIS — M6281 Muscle weakness (generalized): Secondary | ICD-10-CM | POA: Diagnosis not present

## 2023-12-04 DIAGNOSIS — R41841 Cognitive communication deficit: Secondary | ICD-10-CM | POA: Diagnosis not present

## 2023-12-04 DIAGNOSIS — M25551 Pain in right hip: Secondary | ICD-10-CM | POA: Diagnosis not present

## 2023-12-04 DIAGNOSIS — S32810S Multiple fractures of pelvis with stable disruption of pelvic ring, sequela: Secondary | ICD-10-CM | POA: Diagnosis not present

## 2023-12-04 DIAGNOSIS — R2689 Other abnormalities of gait and mobility: Secondary | ICD-10-CM | POA: Diagnosis not present

## 2023-12-04 DIAGNOSIS — R2681 Unsteadiness on feet: Secondary | ICD-10-CM | POA: Diagnosis not present

## 2023-12-05 DIAGNOSIS — S32810S Multiple fractures of pelvis with stable disruption of pelvic ring, sequela: Secondary | ICD-10-CM | POA: Diagnosis not present

## 2023-12-05 DIAGNOSIS — R2681 Unsteadiness on feet: Secondary | ICD-10-CM | POA: Diagnosis not present

## 2023-12-05 DIAGNOSIS — R2689 Other abnormalities of gait and mobility: Secondary | ICD-10-CM | POA: Diagnosis not present

## 2023-12-05 DIAGNOSIS — M6281 Muscle weakness (generalized): Secondary | ICD-10-CM | POA: Diagnosis not present

## 2023-12-05 DIAGNOSIS — R41841 Cognitive communication deficit: Secondary | ICD-10-CM | POA: Diagnosis not present

## 2023-12-05 DIAGNOSIS — M25551 Pain in right hip: Secondary | ICD-10-CM | POA: Diagnosis not present

## 2023-12-06 DIAGNOSIS — M6281 Muscle weakness (generalized): Secondary | ICD-10-CM | POA: Diagnosis not present

## 2023-12-06 DIAGNOSIS — S32810S Multiple fractures of pelvis with stable disruption of pelvic ring, sequela: Secondary | ICD-10-CM | POA: Diagnosis not present

## 2023-12-06 DIAGNOSIS — R2689 Other abnormalities of gait and mobility: Secondary | ICD-10-CM | POA: Diagnosis not present

## 2023-12-06 DIAGNOSIS — R41841 Cognitive communication deficit: Secondary | ICD-10-CM | POA: Diagnosis not present

## 2023-12-06 DIAGNOSIS — R2681 Unsteadiness on feet: Secondary | ICD-10-CM | POA: Diagnosis not present

## 2023-12-06 DIAGNOSIS — M25551 Pain in right hip: Secondary | ICD-10-CM | POA: Diagnosis not present

## 2023-12-09 DIAGNOSIS — M25551 Pain in right hip: Secondary | ICD-10-CM | POA: Diagnosis not present

## 2023-12-09 DIAGNOSIS — R2681 Unsteadiness on feet: Secondary | ICD-10-CM | POA: Diagnosis not present

## 2023-12-09 DIAGNOSIS — M6281 Muscle weakness (generalized): Secondary | ICD-10-CM | POA: Diagnosis not present

## 2023-12-09 DIAGNOSIS — R41841 Cognitive communication deficit: Secondary | ICD-10-CM | POA: Diagnosis not present

## 2023-12-09 DIAGNOSIS — S32810S Multiple fractures of pelvis with stable disruption of pelvic ring, sequela: Secondary | ICD-10-CM | POA: Diagnosis not present

## 2023-12-09 DIAGNOSIS — R2689 Other abnormalities of gait and mobility: Secondary | ICD-10-CM | POA: Diagnosis not present

## 2023-12-10 DIAGNOSIS — R2689 Other abnormalities of gait and mobility: Secondary | ICD-10-CM | POA: Diagnosis not present

## 2023-12-10 DIAGNOSIS — S32810S Multiple fractures of pelvis with stable disruption of pelvic ring, sequela: Secondary | ICD-10-CM | POA: Diagnosis not present

## 2023-12-10 DIAGNOSIS — M25551 Pain in right hip: Secondary | ICD-10-CM | POA: Diagnosis not present

## 2023-12-10 DIAGNOSIS — M6281 Muscle weakness (generalized): Secondary | ICD-10-CM | POA: Diagnosis not present

## 2023-12-10 DIAGNOSIS — R41841 Cognitive communication deficit: Secondary | ICD-10-CM | POA: Diagnosis not present

## 2023-12-10 DIAGNOSIS — R2681 Unsteadiness on feet: Secondary | ICD-10-CM | POA: Diagnosis not present

## 2023-12-11 DIAGNOSIS — S32810S Multiple fractures of pelvis with stable disruption of pelvic ring, sequela: Secondary | ICD-10-CM | POA: Diagnosis not present

## 2023-12-11 DIAGNOSIS — R2681 Unsteadiness on feet: Secondary | ICD-10-CM | POA: Diagnosis not present

## 2023-12-11 DIAGNOSIS — M25551 Pain in right hip: Secondary | ICD-10-CM | POA: Diagnosis not present

## 2023-12-11 DIAGNOSIS — M6281 Muscle weakness (generalized): Secondary | ICD-10-CM | POA: Diagnosis not present

## 2023-12-11 DIAGNOSIS — R41841 Cognitive communication deficit: Secondary | ICD-10-CM | POA: Diagnosis not present

## 2023-12-11 DIAGNOSIS — R2689 Other abnormalities of gait and mobility: Secondary | ICD-10-CM | POA: Diagnosis not present

## 2023-12-12 DIAGNOSIS — S32810S Multiple fractures of pelvis with stable disruption of pelvic ring, sequela: Secondary | ICD-10-CM | POA: Diagnosis not present

## 2023-12-12 DIAGNOSIS — R41841 Cognitive communication deficit: Secondary | ICD-10-CM | POA: Diagnosis not present

## 2023-12-12 DIAGNOSIS — R2681 Unsteadiness on feet: Secondary | ICD-10-CM | POA: Diagnosis not present

## 2023-12-12 DIAGNOSIS — M6281 Muscle weakness (generalized): Secondary | ICD-10-CM | POA: Diagnosis not present

## 2023-12-12 DIAGNOSIS — M25551 Pain in right hip: Secondary | ICD-10-CM | POA: Diagnosis not present

## 2023-12-12 DIAGNOSIS — R2689 Other abnormalities of gait and mobility: Secondary | ICD-10-CM | POA: Diagnosis not present

## 2023-12-13 DIAGNOSIS — R2689 Other abnormalities of gait and mobility: Secondary | ICD-10-CM | POA: Diagnosis not present

## 2023-12-13 DIAGNOSIS — R2681 Unsteadiness on feet: Secondary | ICD-10-CM | POA: Diagnosis not present

## 2023-12-13 DIAGNOSIS — S32810S Multiple fractures of pelvis with stable disruption of pelvic ring, sequela: Secondary | ICD-10-CM | POA: Diagnosis not present

## 2023-12-13 DIAGNOSIS — R41841 Cognitive communication deficit: Secondary | ICD-10-CM | POA: Diagnosis not present

## 2023-12-13 DIAGNOSIS — M25551 Pain in right hip: Secondary | ICD-10-CM | POA: Diagnosis not present

## 2023-12-13 DIAGNOSIS — M6281 Muscle weakness (generalized): Secondary | ICD-10-CM | POA: Diagnosis not present

## 2023-12-16 DIAGNOSIS — R2689 Other abnormalities of gait and mobility: Secondary | ICD-10-CM | POA: Diagnosis not present

## 2023-12-16 DIAGNOSIS — M25551 Pain in right hip: Secondary | ICD-10-CM | POA: Diagnosis not present

## 2023-12-16 DIAGNOSIS — R2681 Unsteadiness on feet: Secondary | ICD-10-CM | POA: Diagnosis not present

## 2023-12-16 DIAGNOSIS — M6281 Muscle weakness (generalized): Secondary | ICD-10-CM | POA: Diagnosis not present

## 2023-12-16 DIAGNOSIS — S32810S Multiple fractures of pelvis with stable disruption of pelvic ring, sequela: Secondary | ICD-10-CM | POA: Diagnosis not present

## 2023-12-16 DIAGNOSIS — R41841 Cognitive communication deficit: Secondary | ICD-10-CM | POA: Diagnosis not present

## 2023-12-17 DIAGNOSIS — R41841 Cognitive communication deficit: Secondary | ICD-10-CM | POA: Diagnosis not present

## 2023-12-17 DIAGNOSIS — S32810S Multiple fractures of pelvis with stable disruption of pelvic ring, sequela: Secondary | ICD-10-CM | POA: Diagnosis not present

## 2023-12-17 DIAGNOSIS — R2681 Unsteadiness on feet: Secondary | ICD-10-CM | POA: Diagnosis not present

## 2023-12-17 DIAGNOSIS — R2689 Other abnormalities of gait and mobility: Secondary | ICD-10-CM | POA: Diagnosis not present

## 2023-12-17 DIAGNOSIS — M25551 Pain in right hip: Secondary | ICD-10-CM | POA: Diagnosis not present

## 2023-12-17 DIAGNOSIS — M6281 Muscle weakness (generalized): Secondary | ICD-10-CM | POA: Diagnosis not present

## 2023-12-18 DIAGNOSIS — M6281 Muscle weakness (generalized): Secondary | ICD-10-CM | POA: Diagnosis not present

## 2023-12-18 DIAGNOSIS — R2689 Other abnormalities of gait and mobility: Secondary | ICD-10-CM | POA: Diagnosis not present

## 2023-12-18 DIAGNOSIS — R2681 Unsteadiness on feet: Secondary | ICD-10-CM | POA: Diagnosis not present

## 2023-12-18 DIAGNOSIS — S32810S Multiple fractures of pelvis with stable disruption of pelvic ring, sequela: Secondary | ICD-10-CM | POA: Diagnosis not present

## 2023-12-18 DIAGNOSIS — R41841 Cognitive communication deficit: Secondary | ICD-10-CM | POA: Diagnosis not present

## 2023-12-18 DIAGNOSIS — M25551 Pain in right hip: Secondary | ICD-10-CM | POA: Diagnosis not present

## 2023-12-19 DIAGNOSIS — R2681 Unsteadiness on feet: Secondary | ICD-10-CM | POA: Diagnosis not present

## 2023-12-19 DIAGNOSIS — M6281 Muscle weakness (generalized): Secondary | ICD-10-CM | POA: Diagnosis not present

## 2023-12-19 DIAGNOSIS — R2689 Other abnormalities of gait and mobility: Secondary | ICD-10-CM | POA: Diagnosis not present

## 2023-12-19 DIAGNOSIS — S32810S Multiple fractures of pelvis with stable disruption of pelvic ring, sequela: Secondary | ICD-10-CM | POA: Diagnosis not present

## 2023-12-19 DIAGNOSIS — M25551 Pain in right hip: Secondary | ICD-10-CM | POA: Diagnosis not present

## 2023-12-19 DIAGNOSIS — R41841 Cognitive communication deficit: Secondary | ICD-10-CM | POA: Diagnosis not present

## 2023-12-20 DIAGNOSIS — M25551 Pain in right hip: Secondary | ICD-10-CM | POA: Diagnosis not present

## 2023-12-20 DIAGNOSIS — M6281 Muscle weakness (generalized): Secondary | ICD-10-CM | POA: Diagnosis not present

## 2023-12-20 DIAGNOSIS — R2681 Unsteadiness on feet: Secondary | ICD-10-CM | POA: Diagnosis not present

## 2023-12-20 DIAGNOSIS — R2689 Other abnormalities of gait and mobility: Secondary | ICD-10-CM | POA: Diagnosis not present

## 2023-12-20 DIAGNOSIS — R41841 Cognitive communication deficit: Secondary | ICD-10-CM | POA: Diagnosis not present

## 2023-12-20 DIAGNOSIS — S32810S Multiple fractures of pelvis with stable disruption of pelvic ring, sequela: Secondary | ICD-10-CM | POA: Diagnosis not present

## 2023-12-23 DIAGNOSIS — R41841 Cognitive communication deficit: Secondary | ICD-10-CM | POA: Diagnosis not present

## 2023-12-23 DIAGNOSIS — M25551 Pain in right hip: Secondary | ICD-10-CM | POA: Diagnosis not present

## 2023-12-23 DIAGNOSIS — R2681 Unsteadiness on feet: Secondary | ICD-10-CM | POA: Diagnosis not present

## 2023-12-23 DIAGNOSIS — M6281 Muscle weakness (generalized): Secondary | ICD-10-CM | POA: Diagnosis not present

## 2023-12-23 DIAGNOSIS — R2689 Other abnormalities of gait and mobility: Secondary | ICD-10-CM | POA: Diagnosis not present

## 2023-12-23 DIAGNOSIS — S32810S Multiple fractures of pelvis with stable disruption of pelvic ring, sequela: Secondary | ICD-10-CM | POA: Diagnosis not present

## 2023-12-24 DIAGNOSIS — S32810S Multiple fractures of pelvis with stable disruption of pelvic ring, sequela: Secondary | ICD-10-CM | POA: Diagnosis not present

## 2023-12-24 DIAGNOSIS — M25551 Pain in right hip: Secondary | ICD-10-CM | POA: Diagnosis not present

## 2023-12-24 DIAGNOSIS — R2689 Other abnormalities of gait and mobility: Secondary | ICD-10-CM | POA: Diagnosis not present

## 2023-12-24 DIAGNOSIS — R41841 Cognitive communication deficit: Secondary | ICD-10-CM | POA: Diagnosis not present

## 2023-12-24 DIAGNOSIS — M6281 Muscle weakness (generalized): Secondary | ICD-10-CM | POA: Diagnosis not present

## 2023-12-24 DIAGNOSIS — R2681 Unsteadiness on feet: Secondary | ICD-10-CM | POA: Diagnosis not present

## 2023-12-24 DIAGNOSIS — Z23 Encounter for immunization: Secondary | ICD-10-CM | POA: Diagnosis not present

## 2023-12-25 DIAGNOSIS — M25552 Pain in left hip: Secondary | ICD-10-CM | POA: Diagnosis not present

## 2023-12-25 DIAGNOSIS — Z9181 History of falling: Secondary | ICD-10-CM | POA: Diagnosis not present

## 2023-12-25 DIAGNOSIS — M25551 Pain in right hip: Secondary | ICD-10-CM | POA: Diagnosis not present

## 2023-12-25 DIAGNOSIS — R41841 Cognitive communication deficit: Secondary | ICD-10-CM | POA: Diagnosis not present

## 2023-12-25 DIAGNOSIS — M6281 Muscle weakness (generalized): Secondary | ICD-10-CM | POA: Diagnosis not present

## 2023-12-25 DIAGNOSIS — S32810S Multiple fractures of pelvis with stable disruption of pelvic ring, sequela: Secondary | ICD-10-CM | POA: Diagnosis not present

## 2023-12-25 DIAGNOSIS — R2689 Other abnormalities of gait and mobility: Secondary | ICD-10-CM | POA: Diagnosis not present

## 2023-12-25 DIAGNOSIS — R2681 Unsteadiness on feet: Secondary | ICD-10-CM | POA: Diagnosis not present

## 2023-12-26 DIAGNOSIS — R41841 Cognitive communication deficit: Secondary | ICD-10-CM | POA: Diagnosis not present

## 2023-12-26 DIAGNOSIS — S32810S Multiple fractures of pelvis with stable disruption of pelvic ring, sequela: Secondary | ICD-10-CM | POA: Diagnosis not present

## 2023-12-26 DIAGNOSIS — R2689 Other abnormalities of gait and mobility: Secondary | ICD-10-CM | POA: Diagnosis not present

## 2023-12-26 DIAGNOSIS — M25551 Pain in right hip: Secondary | ICD-10-CM | POA: Diagnosis not present

## 2023-12-26 DIAGNOSIS — M6281 Muscle weakness (generalized): Secondary | ICD-10-CM | POA: Diagnosis not present

## 2023-12-26 DIAGNOSIS — R2681 Unsteadiness on feet: Secondary | ICD-10-CM | POA: Diagnosis not present

## 2023-12-27 DIAGNOSIS — R2681 Unsteadiness on feet: Secondary | ICD-10-CM | POA: Diagnosis not present

## 2023-12-27 DIAGNOSIS — M25551 Pain in right hip: Secondary | ICD-10-CM | POA: Diagnosis not present

## 2023-12-27 DIAGNOSIS — M6281 Muscle weakness (generalized): Secondary | ICD-10-CM | POA: Diagnosis not present

## 2023-12-27 DIAGNOSIS — R2689 Other abnormalities of gait and mobility: Secondary | ICD-10-CM | POA: Diagnosis not present

## 2023-12-27 DIAGNOSIS — S32810S Multiple fractures of pelvis with stable disruption of pelvic ring, sequela: Secondary | ICD-10-CM | POA: Diagnosis not present

## 2023-12-27 DIAGNOSIS — R41841 Cognitive communication deficit: Secondary | ICD-10-CM | POA: Diagnosis not present

## 2023-12-28 DIAGNOSIS — R2689 Other abnormalities of gait and mobility: Secondary | ICD-10-CM | POA: Diagnosis not present

## 2023-12-28 DIAGNOSIS — R2681 Unsteadiness on feet: Secondary | ICD-10-CM | POA: Diagnosis not present

## 2023-12-28 DIAGNOSIS — M6281 Muscle weakness (generalized): Secondary | ICD-10-CM | POA: Diagnosis not present

## 2023-12-28 DIAGNOSIS — S32810S Multiple fractures of pelvis with stable disruption of pelvic ring, sequela: Secondary | ICD-10-CM | POA: Diagnosis not present

## 2023-12-28 DIAGNOSIS — R41841 Cognitive communication deficit: Secondary | ICD-10-CM | POA: Diagnosis not present

## 2023-12-28 DIAGNOSIS — M25551 Pain in right hip: Secondary | ICD-10-CM | POA: Diagnosis not present

## 2023-12-30 DIAGNOSIS — S32810S Multiple fractures of pelvis with stable disruption of pelvic ring, sequela: Secondary | ICD-10-CM | POA: Diagnosis not present

## 2023-12-30 DIAGNOSIS — R2681 Unsteadiness on feet: Secondary | ICD-10-CM | POA: Diagnosis not present

## 2023-12-30 DIAGNOSIS — M25551 Pain in right hip: Secondary | ICD-10-CM | POA: Diagnosis not present

## 2023-12-30 DIAGNOSIS — R2689 Other abnormalities of gait and mobility: Secondary | ICD-10-CM | POA: Diagnosis not present

## 2023-12-30 DIAGNOSIS — R41841 Cognitive communication deficit: Secondary | ICD-10-CM | POA: Diagnosis not present

## 2023-12-30 DIAGNOSIS — M6281 Muscle weakness (generalized): Secondary | ICD-10-CM | POA: Diagnosis not present

## 2023-12-31 DIAGNOSIS — M25551 Pain in right hip: Secondary | ICD-10-CM | POA: Diagnosis not present

## 2023-12-31 DIAGNOSIS — S32810S Multiple fractures of pelvis with stable disruption of pelvic ring, sequela: Secondary | ICD-10-CM | POA: Diagnosis not present

## 2023-12-31 DIAGNOSIS — R2689 Other abnormalities of gait and mobility: Secondary | ICD-10-CM | POA: Diagnosis not present

## 2023-12-31 DIAGNOSIS — M6281 Muscle weakness (generalized): Secondary | ICD-10-CM | POA: Diagnosis not present

## 2023-12-31 DIAGNOSIS — R2681 Unsteadiness on feet: Secondary | ICD-10-CM | POA: Diagnosis not present

## 2023-12-31 DIAGNOSIS — R41841 Cognitive communication deficit: Secondary | ICD-10-CM | POA: Diagnosis not present

## 2024-01-01 DIAGNOSIS — S32810S Multiple fractures of pelvis with stable disruption of pelvic ring, sequela: Secondary | ICD-10-CM | POA: Diagnosis not present

## 2024-01-01 DIAGNOSIS — R2689 Other abnormalities of gait and mobility: Secondary | ICD-10-CM | POA: Diagnosis not present

## 2024-01-01 DIAGNOSIS — M6281 Muscle weakness (generalized): Secondary | ICD-10-CM | POA: Diagnosis not present

## 2024-01-01 DIAGNOSIS — R41841 Cognitive communication deficit: Secondary | ICD-10-CM | POA: Diagnosis not present

## 2024-01-01 DIAGNOSIS — R2681 Unsteadiness on feet: Secondary | ICD-10-CM | POA: Diagnosis not present

## 2024-01-01 DIAGNOSIS — M25551 Pain in right hip: Secondary | ICD-10-CM | POA: Diagnosis not present

## 2024-01-02 DIAGNOSIS — R2689 Other abnormalities of gait and mobility: Secondary | ICD-10-CM | POA: Diagnosis not present

## 2024-01-02 DIAGNOSIS — M6281 Muscle weakness (generalized): Secondary | ICD-10-CM | POA: Diagnosis not present

## 2024-01-02 DIAGNOSIS — M25551 Pain in right hip: Secondary | ICD-10-CM | POA: Diagnosis not present

## 2024-01-02 DIAGNOSIS — R41841 Cognitive communication deficit: Secondary | ICD-10-CM | POA: Diagnosis not present

## 2024-01-02 DIAGNOSIS — R2681 Unsteadiness on feet: Secondary | ICD-10-CM | POA: Diagnosis not present

## 2024-01-02 DIAGNOSIS — S32810S Multiple fractures of pelvis with stable disruption of pelvic ring, sequela: Secondary | ICD-10-CM | POA: Diagnosis not present

## 2024-01-03 DIAGNOSIS — M25551 Pain in right hip: Secondary | ICD-10-CM | POA: Diagnosis not present

## 2024-01-03 DIAGNOSIS — R41841 Cognitive communication deficit: Secondary | ICD-10-CM | POA: Diagnosis not present

## 2024-01-03 DIAGNOSIS — M6281 Muscle weakness (generalized): Secondary | ICD-10-CM | POA: Diagnosis not present

## 2024-01-03 DIAGNOSIS — R2689 Other abnormalities of gait and mobility: Secondary | ICD-10-CM | POA: Diagnosis not present

## 2024-01-03 DIAGNOSIS — R2681 Unsteadiness on feet: Secondary | ICD-10-CM | POA: Diagnosis not present

## 2024-01-03 DIAGNOSIS — S32810S Multiple fractures of pelvis with stable disruption of pelvic ring, sequela: Secondary | ICD-10-CM | POA: Diagnosis not present

## 2024-01-06 DIAGNOSIS — M25551 Pain in right hip: Secondary | ICD-10-CM | POA: Diagnosis not present

## 2024-01-06 DIAGNOSIS — R2681 Unsteadiness on feet: Secondary | ICD-10-CM | POA: Diagnosis not present

## 2024-01-06 DIAGNOSIS — M6281 Muscle weakness (generalized): Secondary | ICD-10-CM | POA: Diagnosis not present

## 2024-01-06 DIAGNOSIS — S32810S Multiple fractures of pelvis with stable disruption of pelvic ring, sequela: Secondary | ICD-10-CM | POA: Diagnosis not present

## 2024-01-06 DIAGNOSIS — R41841 Cognitive communication deficit: Secondary | ICD-10-CM | POA: Diagnosis not present

## 2024-01-06 DIAGNOSIS — R2689 Other abnormalities of gait and mobility: Secondary | ICD-10-CM | POA: Diagnosis not present

## 2024-01-07 DIAGNOSIS — S32810S Multiple fractures of pelvis with stable disruption of pelvic ring, sequela: Secondary | ICD-10-CM | POA: Diagnosis not present

## 2024-01-07 DIAGNOSIS — M6281 Muscle weakness (generalized): Secondary | ICD-10-CM | POA: Diagnosis not present

## 2024-01-07 DIAGNOSIS — M25551 Pain in right hip: Secondary | ICD-10-CM | POA: Diagnosis not present

## 2024-01-07 DIAGNOSIS — R2689 Other abnormalities of gait and mobility: Secondary | ICD-10-CM | POA: Diagnosis not present

## 2024-01-07 DIAGNOSIS — R2681 Unsteadiness on feet: Secondary | ICD-10-CM | POA: Diagnosis not present

## 2024-01-07 DIAGNOSIS — R41841 Cognitive communication deficit: Secondary | ICD-10-CM | POA: Diagnosis not present

## 2024-01-08 DIAGNOSIS — M6281 Muscle weakness (generalized): Secondary | ICD-10-CM | POA: Diagnosis not present

## 2024-01-08 DIAGNOSIS — M25551 Pain in right hip: Secondary | ICD-10-CM | POA: Diagnosis not present

## 2024-01-08 DIAGNOSIS — S32810S Multiple fractures of pelvis with stable disruption of pelvic ring, sequela: Secondary | ICD-10-CM | POA: Diagnosis not present

## 2024-01-08 DIAGNOSIS — R2681 Unsteadiness on feet: Secondary | ICD-10-CM | POA: Diagnosis not present

## 2024-01-08 DIAGNOSIS — R2689 Other abnormalities of gait and mobility: Secondary | ICD-10-CM | POA: Diagnosis not present

## 2024-01-08 DIAGNOSIS — R41841 Cognitive communication deficit: Secondary | ICD-10-CM | POA: Diagnosis not present

## 2024-01-09 ENCOUNTER — Non-Acute Institutional Stay (SKILLED_NURSING_FACILITY): Payer: Self-pay | Admitting: Adult Health

## 2024-01-09 DIAGNOSIS — M6281 Muscle weakness (generalized): Secondary | ICD-10-CM | POA: Diagnosis not present

## 2024-01-09 DIAGNOSIS — M25551 Pain in right hip: Secondary | ICD-10-CM | POA: Diagnosis not present

## 2024-01-09 DIAGNOSIS — S3282XD Multiple fractures of pelvis without disruption of pelvic ring, subsequent encounter for fracture with routine healing: Secondary | ICD-10-CM | POA: Diagnosis not present

## 2024-01-09 DIAGNOSIS — S32810S Multiple fractures of pelvis with stable disruption of pelvic ring, sequela: Secondary | ICD-10-CM | POA: Diagnosis not present

## 2024-01-09 DIAGNOSIS — R2681 Unsteadiness on feet: Secondary | ICD-10-CM | POA: Diagnosis not present

## 2024-01-09 DIAGNOSIS — R2689 Other abnormalities of gait and mobility: Secondary | ICD-10-CM | POA: Diagnosis not present

## 2024-01-09 DIAGNOSIS — I5042 Chronic combined systolic (congestive) and diastolic (congestive) heart failure: Secondary | ICD-10-CM

## 2024-01-09 DIAGNOSIS — I4821 Permanent atrial fibrillation: Secondary | ICD-10-CM

## 2024-01-09 DIAGNOSIS — M06049 Rheumatoid arthritis without rheumatoid factor, unspecified hand: Secondary | ICD-10-CM | POA: Diagnosis not present

## 2024-01-09 DIAGNOSIS — E039 Hypothyroidism, unspecified: Secondary | ICD-10-CM | POA: Diagnosis not present

## 2024-01-09 DIAGNOSIS — F5101 Primary insomnia: Secondary | ICD-10-CM | POA: Diagnosis not present

## 2024-01-09 DIAGNOSIS — F0150 Vascular dementia without behavioral disturbance: Secondary | ICD-10-CM

## 2024-01-09 DIAGNOSIS — R41841 Cognitive communication deficit: Secondary | ICD-10-CM | POA: Diagnosis not present

## 2024-01-10 ENCOUNTER — Encounter: Payer: Self-pay | Admitting: Adult Health

## 2024-01-10 DIAGNOSIS — R2689 Other abnormalities of gait and mobility: Secondary | ICD-10-CM | POA: Diagnosis not present

## 2024-01-10 DIAGNOSIS — M6281 Muscle weakness (generalized): Secondary | ICD-10-CM | POA: Diagnosis not present

## 2024-01-10 DIAGNOSIS — S32810S Multiple fractures of pelvis with stable disruption of pelvic ring, sequela: Secondary | ICD-10-CM | POA: Diagnosis not present

## 2024-01-10 DIAGNOSIS — R2681 Unsteadiness on feet: Secondary | ICD-10-CM | POA: Diagnosis not present

## 2024-01-10 DIAGNOSIS — R41841 Cognitive communication deficit: Secondary | ICD-10-CM | POA: Diagnosis not present

## 2024-01-10 DIAGNOSIS — M25551 Pain in right hip: Secondary | ICD-10-CM | POA: Diagnosis not present

## 2024-01-10 NOTE — Progress Notes (Signed)
 Location:  Medical illustrator of Service:  SNF (31) Provider:  Tawni America, NP    Patient Care Team: Charlanne Fredia CROME, MD as PCP - General (Internal Medicine) Croitoru, Jerel, MD as PCP - Cardiology (Cardiology) [name], DO as Consulting Physician (Neurology)  Extended Emergency Contact Information Primary Emergency Contact: [name],[name] Address: [address]          [place], [place] [address] [place] Home Phone: [phone] Mobile Phone: [phone] Relation: Daughter Secondary Emergency Contact: [name] Address: [address]          [place], [place] [address] [place] Phone: [phone] Relation: Son  Code Status:  DNR Goals of care: Advanced Directive information    11/26/2023    2:37 PM  Advanced Directives  Does Patient Have a Medical Advance Directive? Yes  Type of Estate agent of [place];Living will  Does patient want to make changes to medical advance directive? No - Patient declined  Copy of Healthcare Power of Attorney in Chart? Yes - validated most recent copy scanned in chart (See row information)     Chief Complaint  Patient presents with   Medical Management of Chronic Issues    HPI:  The patient is a 89 year old seen for chronic medical management.   Pelvic fracture - Sustained a fall on November 16, 2023, resulting in right superior and inferior pubic rami fractures and a pelvic hematoma with active extravasation. - Hospitalized from August 23 to November 20, 2023, for management of pelvic injuries. - Underwent angioembolism of the right pelvic vessel on November 17, 2023. - Recent CT scan of the chest, abdomen, and pelvis revealed a pelvic hematoma measuring 9.4 x 6.7 x 9.4 cm with active bleeding. -She can self propel with the WC -has worked with PT making progress  Afib Off eliquis  due to pelvic hematoma Rate is controlled   Congestive heart  failure - History of congestive heart failure with reduced ejection fraction of 45-50% as of September 24, 2023. - Gained >3 lbs, lasix  given today. Has some chronic DOE.    ILD - Chronic interstitial lung disease and emphysema. - Pulmonary hypertension. - 11/17/23 CT scan showed consolidation in the posterior right middle lobe, a 6 mm left upper lobe nodule, a 5 mm left lower lobe nodule, and small airway impactions. -no cough or sputum production  -prior treatment of PNA 6/30  -no fever, cough, sputum production now  Cognitive impairment -MMSE 15/30 - Underlying dementia. - Resides in a skilled care area.  Insomnia Started back Remeron  due to difficulty sleeping 12/26/23 Improved sleep pattern  Rheumatologic disease - Rheumatoid arthritis managed with Arava  and Enbrel.  Anemia - On iron  supplementation for anemia. - Hemoglobin improving 8.6 12/03/23  History of malignancy - History of colon adenocarcinoma. - Last CEA level was 6.51 on Aug 13, 2023. - Followed by oncology.  Thyroid  dysfunction - Hypothyroidism with TSH of 2.92 in June 2025. - Continues thyroid  supplementation.  Hyperlipidemia Off lipitor per cardiology to decrease pill burden and LDL was 41    Past Medical History:  Diagnosis Date   Allergy    seasonal   Anemia    Anxiety    Arthritis    Back   Asymptomatic menopausal state    Atrial fibrillation (HCC)    Cancer (HCC) 2006   Colon.  Basal Cell Skin cancer- right arm   Cataract    removed bilateral   Chronic  atrial fibrillation (HCC)    Colon cancer (HCC)    Constipation due to pain medication therapy    after heart surgery   Deficiency of other specified B group vitamins    Diplopia    Disorientation, unspecified    Dysrhythmia    PAF   GERD (gastroesophageal reflux disease)    Heart murmur    Hyperlipidemia    Hypertension    Hypothyroidism    Malignant neoplasm of colon, unspecified (HCC)    Other specified diseases of blood and  blood-forming organs    Other specified disorders of bone density and structure, unspecified forearm    Other thrombophilia    per matrix   Overactive bladder    per Matrix   Personal history of other malignant neoplasm of large intestine    Presbycusis, bilateral    per matrix   RA (rheumatoid arthritis) (HCC)    Restless leg    Seizures (HCC)    after Heart Surgey   Stroke (HCC)    TIA- found by neurologist after    Valgus deformity, not elsewhere classified, right knee    per matrix   Vascular dementia, mild, without behavioral disturbance, psychotic disturbance, mood disturbance, and anxiety (HCC)    per matrix   Past Surgical History:  Procedure Laterality Date   ABDOMINAL HYSTERECTOMY  1970   Partial    COLON RESECTION  2006   cancer   COLON SURGERY     COLONOSCOPY     EYE SURGERY Bilateral    Cataract   IR ANGIOGRAM PELVIS SELECTIVE OR SUPRASELECTIVE  11/17/2023   IR ANGIOGRAM SELECTIVE EACH ADDITIONAL VESSEL  11/17/2023   IR EMBO ART  VEN HEMORR LYMPH EXTRAV  INC GUIDE ROADMAPPING  11/17/2023   IR US  GUIDE VASC ACCESS RIGHT  11/17/2023   MAXIMUM ACCESS (MAS)POSTERIOR LUMBAR INTERBODY FUSION (PLIF) 2 LEVEL N/A 04/12/2015   Procedure: Lumbar Three-Five Decompression, Pedicle Screw Fixation, and Posteriolateral Arthrodesis;  Surgeon: Fairy Levels, MD;  Location: [facility];  Service: Neurosurgery;  Laterality: N/A;  L3-4 L4-5 Maximum access posterior lumbar fusion, possible interbodies and resection of synovial cyst at L4-5   MITRAL VALVE REPAIR  01/20/2013   Gore-tex cords to P1, P2, and P3. Magic suture to posterior medial commisure, #30 Physio 1 ring. Done in [place]    TONSILLECTOMY     about 1940   TRICUSPID VALVE SURGERY  01/20/2013   #28 TriAd ring done in [place]     Allergies  Allergen Reactions   Claritin [Loratadine] Other (See Comments)    Listed on MAR Unknown reaction   Codeine Other (See Comments)    just don't take it well   Gabapentin  Other (See  Comments)    Dizziness    Molds & Smuts Other (See Comments)    Dust.  Reaction is not listed on MAR    Pollen Extract Swelling    Grass   Bee Venom Swelling and Rash   Wasp Venom Swelling and Rash    Outpatient Encounter Medications as of 01/09/2024  Medication Sig   acetaminophen  (TYLENOL ) 500 MG tablet Take 2 tablets (1,000 mg total) by mouth every 6 (six) hours as needed for mild pain (pain score 1-3) or fever.   amoxicillin (AMOXIL) 500 MG capsule Take 2,000 mg by mouth as needed.   Cholecalciferol  (VITAMIN D ) 50 MCG (2000 UT) CAPS Take 2,000 Units by mouth in the morning.   cloNIDine  (CATAPRES ) 0.1 MG tablet Take 1 tablet (0.1 mg total) by mouth  daily as needed. If SBP >180 only   docusate sodium  (COLACE) 100 MG capsule Take 100 mg by mouth daily as needed for mild constipation.   ENBREL SURECLICK 50 MG/ML injection Inject 50 mg into the skin once a week. Tuesday   furosemide  (LASIX ) 20 MG tablet Take 1 tablet (20 mg total) by mouth every other day. Can take an additional does of lasix  for a weight gain 2-3 lbs overnight or increased swelling of the legs.   furosemide  (LASIX ) 20 MG tablet Take 20 mg by mouth as needed.   Iron , Ferrous Sulfate , 325 (65 Fe) MG TABS Take 325 mg by mouth daily.   leflunomide  (ARAVA ) 20 MG tablet Take 20 mg by mouth in the morning.   levalbuterol  (XOPENEX ) 0.63 MG/3ML nebulizer solution Take 3 mLs (0.63 mg total) by nebulization every 6 (six) hours as needed for wheezing or shortness of breath.   levothyroxine  (SYNTHROID ) 88 MCG tablet Take 88 mcg by mouth daily before breakfast.   melatonin 5 MG TABS Take 5 mg by mouth at bedtime.   methocarbamol  (ROBAXIN ) 500 MG tablet Take 1 tablet (500 mg total) by mouth every 8 (eight) hours as needed for muscle spasms (musculoskeletal pain).   metoprolol  tartrate (LOPRESSOR ) 100 MG tablet Take 150 mg by mouth daily. If heart rate <50 or SBP >180 or <100   Multiple Vitamin (MULTIVITAMIN ADULT PO) Take 1 tablet by  mouth every morning.   Multiple Vitamins-Minerals (PRESERVISION AREDS 2) CAPS Take 1 capsule by mouth in the morning and at bedtime.   omeprazole  (PRILOSEC) 40 MG capsule TAKE 1 CAPSULE DAILY   polyethylene glycol (MIRALAX  / GLYCOLAX ) 17 g packet Take 17 g by mouth daily as needed for moderate constipation (constipation).   traMADol  HCl 25 MG TABS Take 25 mg by mouth every 8 (eight) hours as needed.   traMADol  HCl 25 MG TABS Take 50 mg by mouth every 8 (eight) hours as needed.   UNABLE TO FIND Apply 1 Application topically every morning. JOBST HOSE NEED SIZE   valsartan  (DIOVAN ) 160 MG tablet Take 160 mg by mouth every evening.   vitamin B-12 (CYANOCOBALAMIN ) 1000 MCG tablet Take 1,000 mcg by mouth in the morning.   No facility-administered encounter medications on file as of 01/09/2024.    Review of Systems  Constitutional:  Positive for activity change. Negative for appetite change, chills, diaphoresis, fatigue, fever and unexpected weight change.  HENT:  Negative for congestion.   Respiratory:  Positive for shortness of breath (on exertion). Negative for cough and wheezing.   Cardiovascular:  Positive for leg swelling. Negative for chest pain and palpitations.  Gastrointestinal:  Negative for abdominal distention, abdominal pain, constipation and diarrhea.  Genitourinary:  Negative for difficulty urinating and dysuria.  Musculoskeletal:  Positive for arthralgias and gait problem. Negative for back pain, joint swelling and myalgias.  Neurological:  Negative for dizziness, tremors, seizures, syncope, facial asymmetry, speech difficulty, weakness, light-headedness, numbness and headaches.  Psychiatric/Behavioral:  Negative for agitation and behavioral problems.        Memory loss    Immunization History  Administered Date(s) Administered   Fluad Quad(high Dose 65+) 01/05/2019, 12/07/2021, 01/15/2023   Fluad Trivalent(High Dose 65+) 01/15/2023   H1N1 04/07/2008   INFLUENZA, HIGH DOSE  SEASONAL PF 01/15/2012, 12/23/2014, 01/22/2020   Influenza Whole 12/29/2008, 12/22/2009   Influenza,inj,Quad PF,6+ Mos 01/22/2014, 12/10/2016, 01/17/2018   Influenza,trivalent, recombinat, inj, PF 01/05/2016   Influenza-Unspecified 04/07/2008, 12/15/2014, 01/13/2021   Moderna Covid-19 Vaccine  Bivalent Booster 72yrs &  up 01/29/2022, 01/15/2023   Moderna Sars-Covid-2 Vaccination 04/07/2019, 05/05/2019, 02/09/2020   PFIZER(Purple Top)SARS-COV-2 Vaccination 01/06/2021   PPD Test 07/17/2010, 04/19/2015, 04/25/2015   Pfizer Covid-19 Vaccine Bivalent Booster 79yrs & up 07/19/2022   Pneumococcal Conjugate-13 11/10/2013   Pneumococcal Polysaccharide-23 12/27/2010, 12/05/2016   RSV,unspecified 03/28/2022   Tdap 04/15/2017   Zoster Recombinant(Shingrix) 04/09/2017, 06/18/2017   Pertinent  Health Maintenance Due  Topic Date Due   Influenza Vaccine  10/25/2023   DEXA SCAN  Completed      08/29/2022    3:31 PM 04/18/2023   12:52 PM 10/04/2023   10:46 AM 10/23/2023    2:10 PM 11/27/2023   10:26 AM  Fall Risk  Falls in the past year? 1 0 0 0 1  Was there an injury with Fall? 1 0 0 0 1  Fall Risk Category Calculator 2 0 0 0 2  Patient at Risk for Falls Due to   History of fall(s) History of fall(s);Impaired balance/gait History of fall(s);Impaired balance/gait  Fall risk Follow up Falls evaluation completed Falls evaluation completed Falls evaluation completed Falls evaluation completed;Education provided Falls evaluation completed;Education provided   Functional Status Survey:    Vitals:   01/10/24 0719  Weight: 125 lb 12.8 oz (57.1 kg)   Body mass index is 23.01 kg/m. Physical Exam Vitals reviewed.  Constitutional:      Appearance: Normal appearance.  Cardiovascular:     Rate and Rhythm: Normal rate. Rhythm irregular.     Heart sounds: Murmur heard.  Pulmonary:     Effort: Pulmonary effort is normal. No respiratory distress.     Breath sounds: No wheezing.     Comments: Decreased  bases Abdominal:     General: Bowel sounds are normal. There is no distension.     Palpations: Abdomen is soft.     Tenderness: There is no abdominal tenderness. There is no guarding.  Musculoskeletal:     Right lower leg: No edema.     Left lower leg: No edema.  Skin:    General: Skin is warm and dry.  Neurological:     General: No focal deficit present.     Mental Status: She is alert. Mental status is at baseline.     Labs reviewed: Recent Labs    11/18/23 0649 11/19/23 0512 11/20/23 0426  NA 135 131* 131*  K 4.3 4.1 3.8  CL 104 100 97*  CO2 23 22 22   GLUCOSE 100* 123* 96  BUN 25* 35* 30*  CREATININE 0.89 0.98 0.88  CALCIUM  8.5* 8.6* 8.2*   Recent Labs    02/05/23 1146 08/02/23 0000 11/16/23 2210  AST 18 25 44*  ALT 19 22 42  ALKPHOS 91 118 104  BILITOT 0.8  --  0.8  PROT 7.1  --  6.6  ALBUMIN 4.1  --  3.6   Recent Labs    02/05/23 1146 04/04/23 0000 08/13/23 1130 11/16/23 2210 11/17/23 0350 11/18/23 1242 11/19/23 0512 11/20/23 0426 11/26/23 0000  WBC 7.6   < > 7.6 10.5   < > 10.2 9.9 8.6 7.6  NEUTROABS 4.5  --  4.3 6.3  --   --   --   --   --   HGB 11.5*   < > 11.8* 10.6*   < > 7.6* 7.3* 7.3* 7.5*  HCT 34.6*   < > 35.0* 32.1*   < > 22.7* 22.4* 22.1* 22*  MCV 102.4*  --  103.6* 105.6*   < > 105.1* 105.2* 105.7*  --  PLT 163   < > 177 196   < > 128* 138* 142* 231   < > = values in this interval not displayed.   Lab Results  Component Value Date   TSH 2.92 09/13/2023   Lab Results  Component Value Date   HGBA1C 5.5 01/12/2013   Lab Results  Component Value Date   CHOL 114 08/02/2023   HDL 53 08/02/2023   LDLCALC 44 08/02/2023   TRIG 86 08/02/2023    Significant Diagnostic Results in last 30 days:  IR Angiogram Pelvis Selective Or Supraselective Result Date: 11/17/2023 INDICATION: Clemens, pelvic fracture with expanding hematoma and continued arterial extravasation noted on recent CT EXAM: ULTRASOUND GUIDANCE FOR VASCULAR ACCESS RIGHT  INTERNAL ILIAC ARTERIOGRAPHY SUB SELECTIVE SECOND ORDER BRANCH EMBOLIZATION X2 MEDICATIONS: No periprocedural antibiotics were indicated ANESTHESIA/SEDATION: Intravenous Fentanyl  50mcg and Versed  1mg  were administered by RN during a total moderate (conscious) sedation time of 35 minutes; the patient's level of consciousness and physiological / cardiorespiratory status were monitored continuously by radiology RN under my direct supervision. PROCEDURE: Informed consent was obtained from the patient following explanation of the procedure, risks, benefits and alternatives. The patient understands, agrees and consents for the procedure. All questions were addressed. A time out was performed prior to the initiation of the procedure. Maximal barrier sterile technique utilized including caps, mask, sterile gowns, sterile gloves, large sterile drape, hand hygiene, and Betadine prep. Right common femoral artery accessed with a micropuncture set under real-time ultrasound guidance, exchanged for a 5 French vascular sheath, through which a 5 [place] Kumpe catheter was advanced and used to selectively catheterize the right internal iliac artery. Confirmatory arteriography was performed. A coaxial Renegade hi Flo microcatheter with a fathom 016 guidewire was advanced to selectively catheterize 2 branches of the anterior division right internal iliac artery for selective embolization using Gelfoam slurry, to stasis of flow into peripheral branches. Follow-up arteriography was performed each level. No evidence of nontarget embolization. The catheters and sheath were then removed and hemostasis achieved with the aid of calc to device. The patient tolerated the procedure well. CONTRAST:  35 mL Omnipaque  300 IA FLUOROSCOPY: Radiation Exposure Index (as provided by the fluoroscopic device): 292 mGy Kerma COMPLICATIONS: None immediate. IMPRESSION: 1. Technically successful sub selective embolization of right internal iliac artery  branches with temporary embolic (Gelfoam slurry) without immediate complication. Electronically Signed   By: JONETTA Faes M.D.   On: 11/17/2023 14:46   IR US  Guide Vasc Access Right Result Date: 11/17/2023 INDICATION: Clemens, pelvic fracture with expanding hematoma and continued arterial extravasation noted on recent CT EXAM: ULTRASOUND GUIDANCE FOR VASCULAR ACCESS RIGHT INTERNAL ILIAC ARTERIOGRAPHY SUB SELECTIVE SECOND ORDER BRANCH EMBOLIZATION X2 MEDICATIONS: No periprocedural antibiotics were indicated ANESTHESIA/SEDATION: Intravenous Fentanyl  50mcg and Versed  1mg  were administered by RN during a total moderate (conscious) sedation time of 35 minutes; the patient's level of consciousness and physiological / cardiorespiratory status were monitored continuously by radiology RN under my direct supervision. PROCEDURE: Informed consent was obtained from the patient following explanation of the procedure, risks, benefits and alternatives. The patient understands, agrees and consents for the procedure. All questions were addressed. A time out was performed prior to the initiation of the procedure. Maximal barrier sterile technique utilized including caps, mask, sterile gowns, sterile gloves, large sterile drape, hand hygiene, and Betadine prep. Right common femoral artery accessed with a micropuncture set under real-time ultrasound guidance, exchanged for a 5 French vascular sheath, through which a 5 [place] Kumpe catheter was advanced and  used to selectively catheterize the right internal iliac artery. Confirmatory arteriography was performed. A coaxial Renegade hi Flo microcatheter with a fathom 016 guidewire was advanced to selectively catheterize 2 branches of the anterior division right internal iliac artery for selective embolization using Gelfoam slurry, to stasis of flow into peripheral branches. Follow-up arteriography was performed each level. No evidence of nontarget embolization. The catheters and sheath were  then removed and hemostasis achieved with the aid of calc to device. The patient tolerated the procedure well. CONTRAST:  35 mL Omnipaque  300 IA FLUOROSCOPY: Radiation Exposure Index (as provided by the fluoroscopic device): 292 mGy Kerma COMPLICATIONS: None immediate. IMPRESSION: 1. Technically successful sub selective embolization of right internal iliac artery branches with temporary embolic (Gelfoam slurry) without immediate complication. Electronically Signed   By: JONETTA Faes M.D.   On: 11/17/2023 14:46   IR EMBO ART  VEN HEMORR LYMPH EXTRAV  INC GUIDE ROADMAPPING Result Date: 11/17/2023 INDICATION: Clemens, pelvic fracture with expanding hematoma and continued arterial extravasation noted on recent CT EXAM: ULTRASOUND GUIDANCE FOR VASCULAR ACCESS RIGHT INTERNAL ILIAC ARTERIOGRAPHY SUB SELECTIVE SECOND ORDER BRANCH EMBOLIZATION X2 MEDICATIONS: No periprocedural antibiotics were indicated ANESTHESIA/SEDATION: Intravenous Fentanyl  50mcg and Versed  1mg  were administered by RN during a total moderate (conscious) sedation time of 35 minutes; the patient's level of consciousness and physiological / cardiorespiratory status were monitored continuously by radiology RN under my direct supervision. PROCEDURE: Informed consent was obtained from the patient following explanation of the procedure, risks, benefits and alternatives. The patient understands, agrees and consents for the procedure. All questions were addressed. A time out was performed prior to the initiation of the procedure. Maximal barrier sterile technique utilized including caps, mask, sterile gowns, sterile gloves, large sterile drape, hand hygiene, and Betadine prep. Right common femoral artery accessed with a micropuncture set under real-time ultrasound guidance, exchanged for a 5 French vascular sheath, through which a 5 [place] Kumpe catheter was advanced and used to selectively catheterize the right internal iliac artery. Confirmatory arteriography was  performed. A coaxial Renegade hi Flo microcatheter with a fathom 016 guidewire was advanced to selectively catheterize 2 branches of the anterior division right internal iliac artery for selective embolization using Gelfoam slurry, to stasis of flow into peripheral branches. Follow-up arteriography was performed each level. No evidence of nontarget embolization. The catheters and sheath were then removed and hemostasis achieved with the aid of calc to device. The patient tolerated the procedure well. CONTRAST:  35 mL Omnipaque  300 IA FLUOROSCOPY: Radiation Exposure Index (as provided by the fluoroscopic device): 292 mGy Kerma COMPLICATIONS: None immediate. IMPRESSION: 1. Technically successful sub selective embolization of right internal iliac artery branches with temporary embolic (Gelfoam slurry) without immediate complication. Electronically Signed   By: JONETTA Faes M.D.   On: 11/17/2023 14:46   IR Angiogram Selective Each Additional Vessel Result Date: 11/17/2023 INDICATION: Clemens, pelvic fracture with expanding hematoma and continued arterial extravasation noted on recent CT EXAM: ULTRASOUND GUIDANCE FOR VASCULAR ACCESS RIGHT INTERNAL ILIAC ARTERIOGRAPHY SUB SELECTIVE SECOND ORDER BRANCH EMBOLIZATION X2 MEDICATIONS: No periprocedural antibiotics were indicated ANESTHESIA/SEDATION: Intravenous Fentanyl  50mcg and Versed  1mg  were administered by RN during a total moderate (conscious) sedation time of 35 minutes; the patient's level of consciousness and physiological / cardiorespiratory status were monitored continuously by radiology RN under my direct supervision. PROCEDURE: Informed consent was obtained from the patient following explanation of the procedure, risks, benefits and alternatives. The patient understands, agrees and consents for the procedure. All questions were addressed. A time  out was performed prior to the initiation of the procedure. Maximal barrier sterile technique utilized including caps,  mask, sterile gowns, sterile gloves, large sterile drape, hand hygiene, and Betadine prep. Right common femoral artery accessed with a micropuncture set under real-time ultrasound guidance, exchanged for a 5 French vascular sheath, through which a 5 [place] Kumpe catheter was advanced and used to selectively catheterize the right internal iliac artery. Confirmatory arteriography was performed. A coaxial Renegade hi Flo microcatheter with a fathom 016 guidewire was advanced to selectively catheterize 2 branches of the anterior division right internal iliac artery for selective embolization using Gelfoam slurry, to stasis of flow into peripheral branches. Follow-up arteriography was performed each level. No evidence of nontarget embolization. The catheters and sheath were then removed and hemostasis achieved with the aid of calc to device. The patient tolerated the procedure well. CONTRAST:  35 mL Omnipaque  300 IA FLUOROSCOPY: Radiation Exposure Index (as provided by the fluoroscopic device): 292 mGy Kerma COMPLICATIONS: None immediate. IMPRESSION: 1. Technically successful sub selective embolization of right internal iliac artery branches with temporary embolic (Gelfoam slurry) without immediate complication. Electronically Signed   By: JONETTA Faes M.D.   On: 11/17/2023 14:46   CT CHEST W CONTRAST Result Date: 11/17/2023 CLINICAL DATA:  Fall, with suspected acute comminuted right inferior pubic ramus fracture, concern is for urinary injury. Blunt polytrauma with potential for acute chest trauma as well. EXAM: CT CHEST WITH CONTRAST CT ABDOMEN AND PELVIS WITH AND WITHOUT CONTRAST TECHNIQUE: Multidetector CT imaging of the chest was performed during intravenous contrast administration. Multidetector CT imaging of the abdomen and pelvis was performed prior to and following the standard protocol before and during bolus administration of intravenous contrast. RADIATION DOSE REDUCTION: This exam was performed according to  the departmental dose-optimization program which includes automated exposure control, adjustment of the mA and/or kV according to patient size and/or use of iterative reconstruction technique. CONTRAST:  OMNIPAQUE  IOHEXOL  350 MG/ML SOLN COMPARISON:  Most recent chest x-ray was PA Lat chest 09/23/2023, before that chest radiograph 08/06/2022. Comparison is made with the chest, abdomen and pelvis CT with contrast 03/31/2021, abdomen and pelvis CT with IV contrast 03/06/2022. FINDINGS: CT CHEST FINDINGS Cardiovascular: The heart is slightly enlarged. Again noted are sternotomy sutures with prosthetic tricuspid and mitral valves. There is chronic left atrial enlargement and central venous prominence. Patchy three-vessel coronary artery calcification. Enlarged pulmonary trunk again measures 3.6 cm indicating arterial hypertension. There is no central embolus on this nontailored exam. There is atherosclerosis in the aorta and great vessels without aneurysm, stenosis or dissection. No pericardial effusion. Mediastinum/Nodes: Small small hiatal hernia. Unremarkable esophagus. Thyroid  gland is not seen and could be removed, ablated or extremely atrophic. There is no intrathoracic or axillary adenopathy. The trachea is ectatic but clear. There is tracheobronchial chondrocalcinosis. Lungs/Pleura: There is biapical symmetric pleuroparenchymal scarring, calcifications and bronchiolectasis. There is mild centrilobular emphysema in the upper lobes, and subpleural reticulation in the lung fields with an upper lobe predominance consistent with chronic interstitial lung disease. There is increased consolidation posteriorly in the right middle lobe which is most likely either due to pneumonia or aspiration. There is increased posterior atelectasis in the basilar lower lobes. Interval new 6 mm subpleural left upper lobe nodule on 5:55. There is a new pleural-based 5 mm left lower lobe nodule on 5:117. There is new branching  nodularity in the posterolateral left base consistent with small airway impactions. Similar new branching nodularity right lower lobe on 5:112, anterior left  upper lobe on 5:48 and in the lingula on 5:75. There is no pleural effusion.  No pneumothorax. Musculoskeletal: Osteopenia, mild kyphosis and degenerative change thoracic spine. No acute or significant osseous findings. The CT ABDOMEN AND PELVIS FINDINGS Hepatobiliary: No focal liver abnormality is seen. No gallstones, gallbladder wall thickening, or biliary dilatation. Pancreas: Unremarkable. No pancreatic ductal dilatation or surrounding inflammatory changes. Spleen: Normal in size without focal abnormality. Adrenals/Urinary Tract: No adrenal mass is seen. There is no renal mass enhancement. Stable 1 cm Bosniak 1 cyst anterior right kidney. Occasional subcentimeter Bosniak 2 cortical cysts of both kidneys are too small to characterize. There is cortical thinning in both kidneys. No urinary stone or obstruction. The bladder wall is unremarkable, but the bladder is displaced into the left hemipelvis and mildly compressed by a sizable right perivesical extraperitoneal hematoma. No contrast extravasation outside of the bladder is seen. Stomach/Bowel: Prior partial right hemicolectomy with primary ileocolic anastomosis. No dilatation or overt wall thickening. Advanced sigmoid diverticulosis without evidence of diverticulitis. Vascular/Lymphatic: Large hematoma in the right hemipelvis along side the bladder, measuring 9.4 x 6.7 x 9.4 cm with active bleeding into the hematoma and scattered contrast levels on the delayed exam. On the initial arterial phase images there is curvilinear contrast within the collection, but it could not be traced back to a specific vessel. Additional free hemoperitoneum collects above the bladder dome to the left and right. Reproductive: Status post hysterectomy. No adnexal masses. Other: Free hemorrhage above the bladder and hematoma to  the right of the bladder as above. No free air. Musculoskeletal: Comminuted fracture, not significantly displaced involving medial aspect of the right superior pubic ramus and the mid to medial aspect of the right inferior pubic ramus, with swelling in the adjacent right obturator internus and externus. There is osteopenia, degenerative and postsurgical change of the spine. No other regional skeletal fracture is evident. IMPRESSION: 1. Hematoma in the right hemipelvis along side the bladder, measuring 9.4 x 6.7 x 9.4 cm, with active bleeding into the hematoma and scattered contrast levels on the delayed exam. The active bleeding could not be traced back to a specific vessel. 2. Comminuted fractures of the right superior and inferior pubic rami. 3. No urinary bladder extravasation is seen. 4. No other acute trauma related findings in the chest, abdomen or pelvis. 5. Chronic interstitial lung disease with increased consolidation in the posterior right middle lobe which could be due to pneumonia or aspiration. 6. New 6 mm left upper lobe and 5 mm left lower lobe nodules. Follow-up as indicated taking into account age and life expectancy. 7. Scattered peripheral branching nodularity new from prior study consistent with small airway impactions. 8. Aortic and coronary artery atherosclerosis. 9. Cardiomegaly with chronic left atrial enlargement and central venous prominence. Tricuspid and mitral prosthesis. 10. Enlarged pulmonary trunk indicating arterial hypertension. 11. Small hiatal hernia. 12. Advanced sigmoid diverticulosis. 13. Osteopenia and degenerative change. 14. Emphysema. 15. Critical Value/emergent results were called by telephone at the time of interpretation on 11/17/2023 at 4:09 am to provider [facility] , who verbally acknowledged these results. Aortic Atherosclerosis (ICD10-I70.0) and Emphysema (ICD10-J43.9). Electronically Signed   By: Francis Quam M.D.   On: 11/17/2023 04:17   CT ABDOMEN PELVIS W  WO CONTRAST Result Date: 11/17/2023 CLINICAL DATA:  Fall, with suspected acute comminuted right inferior pubic ramus fracture, concern is for urinary injury. Blunt polytrauma with potential for acute chest trauma as well. EXAM: CT CHEST WITH CONTRAST CT ABDOMEN AND PELVIS WITH  AND WITHOUT CONTRAST TECHNIQUE: Multidetector CT imaging of the chest was performed during intravenous contrast administration. Multidetector CT imaging of the abdomen and pelvis was performed prior to and following the standard protocol before and during bolus administration of intravenous contrast. RADIATION DOSE REDUCTION: This exam was performed according to the departmental dose-optimization program which includes automated exposure control, adjustment of the mA and/or kV according to patient size and/or use of iterative reconstruction technique. CONTRAST:  OMNIPAQUE  IOHEXOL  350 MG/ML SOLN COMPARISON:  Most recent chest x-ray was PA Lat chest 09/23/2023, before that chest radiograph 08/06/2022. Comparison is made with the chest, abdomen and pelvis CT with contrast 03/31/2021, abdomen and pelvis CT with IV contrast 03/06/2022. FINDINGS: CT CHEST FINDINGS Cardiovascular: The heart is slightly enlarged. Again noted are sternotomy sutures with prosthetic tricuspid and mitral valves. There is chronic left atrial enlargement and central venous prominence. Patchy three-vessel coronary artery calcification. Enlarged pulmonary trunk again measures 3.6 cm indicating arterial hypertension. There is no central embolus on this nontailored exam. There is atherosclerosis in the aorta and great vessels without aneurysm, stenosis or dissection. No pericardial effusion. Mediastinum/Nodes: Small small hiatal hernia. Unremarkable esophagus. Thyroid  gland is not seen and could be removed, ablated or extremely atrophic. There is no intrathoracic or axillary adenopathy. The trachea is ectatic but clear. There is tracheobronchial chondrocalcinosis.  Lungs/Pleura: There is biapical symmetric pleuroparenchymal scarring, calcifications and bronchiolectasis. There is mild centrilobular emphysema in the upper lobes, and subpleural reticulation in the lung fields with an upper lobe predominance consistent with chronic interstitial lung disease. There is increased consolidation posteriorly in the right middle lobe which is most likely either due to pneumonia or aspiration. There is increased posterior atelectasis in the basilar lower lobes. Interval new 6 mm subpleural left upper lobe nodule on 5:55. There is a new pleural-based 5 mm left lower lobe nodule on 5:117. There is new branching nodularity in the posterolateral left base consistent with small airway impactions. Similar new branching nodularity right lower lobe on 5:112, anterior left upper lobe on 5:48 and in the lingula on 5:75. There is no pleural effusion.  No pneumothorax. Musculoskeletal: Osteopenia, mild kyphosis and degenerative change thoracic spine. No acute or significant osseous findings. The CT ABDOMEN AND PELVIS FINDINGS Hepatobiliary: No focal liver abnormality is seen. No gallstones, gallbladder wall thickening, or biliary dilatation. Pancreas: Unremarkable. No pancreatic ductal dilatation or surrounding inflammatory changes. Spleen: Normal in size without focal abnormality. Adrenals/Urinary Tract: No adrenal mass is seen. There is no renal mass enhancement. Stable 1 cm Bosniak 1 cyst anterior right kidney. Occasional subcentimeter Bosniak 2 cortical cysts of both kidneys are too small to characterize. There is cortical thinning in both kidneys. No urinary stone or obstruction. The bladder wall is unremarkable, but the bladder is displaced into the left hemipelvis and mildly compressed by a sizable right perivesical extraperitoneal hematoma. No contrast extravasation outside of the bladder is seen. Stomach/Bowel: Prior partial right hemicolectomy with primary ileocolic anastomosis. No  dilatation or overt wall thickening. Advanced sigmoid diverticulosis without evidence of diverticulitis. Vascular/Lymphatic: Large hematoma in the right hemipelvis along side the bladder, measuring 9.4 x 6.7 x 9.4 cm with active bleeding into the hematoma and scattered contrast levels on the delayed exam. On the initial arterial phase images there is curvilinear contrast within the collection, but it could not be traced back to a specific vessel. Additional free hemoperitoneum collects above the bladder dome to the left and right. Reproductive: Status post hysterectomy. No adnexal masses. Other: Free  hemorrhage above the bladder and hematoma to the right of the bladder as above. No free air. Musculoskeletal: Comminuted fracture, not significantly displaced involving medial aspect of the right superior pubic ramus and the mid to medial aspect of the right inferior pubic ramus, with swelling in the adjacent right obturator internus and externus. There is osteopenia, degenerative and postsurgical change of the spine. No other regional skeletal fracture is evident. IMPRESSION: 1. Hematoma in the right hemipelvis along side the bladder, measuring 9.4 x 6.7 x 9.4 cm, with active bleeding into the hematoma and scattered contrast levels on the delayed exam. The active bleeding could not be traced back to a specific vessel. 2. Comminuted fractures of the right superior and inferior pubic rami. 3. No urinary bladder extravasation is seen. 4. No other acute trauma related findings in the chest, abdomen or pelvis. 5. Chronic interstitial lung disease with increased consolidation in the posterior right middle lobe which could be due to pneumonia or aspiration. 6. New 6 mm left upper lobe and 5 mm left lower lobe nodules. Follow-up as indicated taking into account age and life expectancy. 7. Scattered peripheral branching nodularity new from prior study consistent with small airway impactions. 8. Aortic and coronary artery  atherosclerosis. 9. Cardiomegaly with chronic left atrial enlargement and central venous prominence. Tricuspid and mitral prosthesis. 10. Enlarged pulmonary trunk indicating arterial hypertension. 11. Small hiatal hernia. 12. Advanced sigmoid diverticulosis. 13. Osteopenia and degenerative change. 14. Emphysema. 15. Critical Value/emergent results were called by telephone at the time of interpretation on 11/17/2023 at 4:09 am to provider [facility] , who verbally acknowledged these results. Aortic Atherosclerosis (ICD10-I70.0) and Emphysema (ICD10-J43.9). Electronically Signed   By: Francis Quam M.D.   On: 11/17/2023 04:17   CT PELVIS WO CONTRAST Result Date: 11/17/2023 CLINICAL DATA:  Hip trauma, fracture suspected, xray done EXAM: CT PELVIS WITHOUT CONTRAST TECHNIQUE: Multidetector CT imaging of the pelvis was performed following the standard protocol without intravenous contrast. RADIATION DOSE REDUCTION: This exam was performed according to the departmental dose-optimization program which includes automated exposure control, adjustment of the mA and/or kV according to patient size and/or use of iterative reconstruction technique. COMPARISON:  X-ray right hip 11/16/2023 FINDINGS: Urinary Tract:  Irregular right urinary bladder wall thick. Bowel: Colonic diverticulosis. Unremarkable visualized pelvic bowel loops. Vascular/Lymphatic: No pathologically enlarged lymph nodes. No significant vascular abnormality seen. Reproductive:  No mass or other significant abnormality Other: Large volume right space of Rezius high density free fluid. No intraperitoneal free gas. No organized fluid collection. Musculoskeletal: Acute comminuted and displaced right inferior pubic rami and superior pubic rami/pubic symphysis fracture. Acute nondisplaced right iliac fracture (9:22; 4:50). Asymmetric and irregular right internal and external obturator musculature. IMPRESSION: 1. Acute comminuted and displaced right inferior pubic  rami and superior pubic rami/pubic symphysis fracture. 2. Acute nondisplaced right iliac fracture. 3. Large volume right extraperitoneal hematoma with likely associated extraperitoneal urinary bladder injury. Intraperitoneal urinary bladder injury not excluded. Recommend CT urogram. 4. Right internal and external obturator intramuscular hematoma. Electronically Signed   By: Morgane  Naveau M.D.   On: 11/17/2023 00:53   CT Thoracic Spine Wo Contrast Result Date: 11/17/2023 CLINICAL DATA:  Back trauma, no prior imaging (Age >= 16y) EXAM: CT THORACIC AND LUMBAR SPINE WITHOUT CONTRAST TECHNIQUE: Multidetector CT imaging of the thoracic and lumbar spine was performed without contrast. Multiplanar CT image reconstructions were also generated. RADIATION DOSE REDUCTION: This exam was performed according to the departmental dose-optimization program which includes automated exposure control, adjustment  of the mA and/or kV according to patient size and/or use of iterative reconstruction technique. COMPARISON:  MR lumbar spine 04/23/2018 FINDINGS: CT THORACIC SPINE FINDINGS Alignment: Grade 1 anterolisthesis of T1 on T2, T2 on T3, T3 on T4, T4 on T5.\. Vertebrae: No acute fracture or focal pathologic process. Paraspinal and other soft tissues: Negative. Disc levels: Multilevel intervertebral disc space vacuum phenomenon. CT LUMBAR SPINE FINDINGS Segmentation: 5 lumbar type vertebrae. Alignment: Stable grade 1 anterolisthesis of L3 on L4 and L4 on L5. Vertebrae: L3-L5 posterolateral surgical hardware. No acute fracture or focal pathologic process. Paraspinal and other soft tissues: Negative. Disc levels: Multilevel intervertebral disc space vacuum phenomenon. Other: Coarsened interlobular softball thickening. Peripheral reticulations. Atherosclerotic plaque. Colonic diverticulosis. IMPRESSION: CT THORACIC SPINE IMPRESSION No acute displaced fracture or traumatic listhesis of the thoracic spine. CT LUMBAR SPINE IMPRESSION No  acute displaced fracture or traumatic listhesis of the lumbar spine. Aortic Atherosclerosis (ICD10-I70.0). Electronically Signed   By: Morgane  Naveau M.D.   On: 11/17/2023 00:47   CT Lumbar Spine Wo Contrast Result Date: 11/17/2023 CLINICAL DATA:  Back trauma, no prior imaging (Age >= 16y) EXAM: CT THORACIC AND LUMBAR SPINE WITHOUT CONTRAST TECHNIQUE: Multidetector CT imaging of the thoracic and lumbar spine was performed without contrast. Multiplanar CT image reconstructions were also generated. RADIATION DOSE REDUCTION: This exam was performed according to the departmental dose-optimization program which includes automated exposure control, adjustment of the mA and/or kV according to patient size and/or use of iterative reconstruction technique. COMPARISON:  MR lumbar spine 04/23/2018 FINDINGS: CT THORACIC SPINE FINDINGS Alignment: Grade 1 anterolisthesis of T1 on T2, T2 on T3, T3 on T4, T4 on T5.\. Vertebrae: No acute fracture or focal pathologic process. Paraspinal and other soft tissues: Negative. Disc levels: Multilevel intervertebral disc space vacuum phenomenon. CT LUMBAR SPINE FINDINGS Segmentation: 5 lumbar type vertebrae. Alignment: Stable grade 1 anterolisthesis of L3 on L4 and L4 on L5. Vertebrae: L3-L5 posterolateral surgical hardware. No acute fracture or focal pathologic process. Paraspinal and other soft tissues: Negative. Disc levels: Multilevel intervertebral disc space vacuum phenomenon. Other: Coarsened interlobular softball thickening. Peripheral reticulations. Atherosclerotic plaque. Colonic diverticulosis. IMPRESSION: CT THORACIC SPINE IMPRESSION No acute displaced fracture or traumatic listhesis of the thoracic spine. CT LUMBAR SPINE IMPRESSION No acute displaced fracture or traumatic listhesis of the lumbar spine. Aortic Atherosclerosis (ICD10-I70.0). Electronically Signed   By: Morgane  Naveau M.D.   On: 11/17/2023 00:47   CT Head Wo Contrast Result Date: 11/16/2023 CLINICAL DATA:   Head trauma, minor (Age >= 65y); Neck trauma (Age >= 65y). Fall EXAM: CT HEAD WITHOUT CONTRAST CT CERVICAL SPINE WITHOUT CONTRAST TECHNIQUE: Multidetector CT imaging of the head and cervical spine was performed following the standard protocol without intravenous contrast. Multiplanar CT image reconstructions of the cervical spine were also generated. RADIATION DOSE REDUCTION: This exam was performed according to the departmental dose-optimization program which includes automated exposure control, adjustment of the mA and/or kV according to patient size and/or use of iterative reconstruction technique. COMPARISON:  CT head 12/06/2021, CT head and C-spine 10/22/2021 FINDINGS: CT HEAD FINDINGS Brain: Cerebral ventricle sizes are concordant with the degree of cerebral volume loss. Patchy and confluent areas of decreased attenuation are noted throughout the deep and periventricular white matter of the cerebral hemispheres bilaterally, compatible with chronic microvascular ischemic disease. No evidence of large-territorial acute infarction. No parenchymal hemorrhage. No mass lesion. No extra-axial collection. No mass effect or midline shift. No hydrocephalus. Basilar cisterns are patent. Vascular: No hyperdense vessel. Atherosclerotic calcifications  are present within the cavernous internal carotid and vertebral arteries. Skull: Old healed right orbital floor fracture. No acute fracture or focal lesion. Sinuses/Orbits: Paranasal sinuses and mastoid air cells are clear. Bilateral lens replacement. Otherwise the orbits are unremarkable. Other: None. CT CERVICAL SPINE FINDINGS Alignment: Grade 1 anterolisthesis of C2 on C3, grade 1 anterolisthesis of C3 on C4. Retrolisthesis of C4 on C5 and C5 on C6. Skull base and vertebrae: Multilevel severe degenerative change of the spine with associated severe osseous neural foraminal stenosis of the right C4-C5 and C5-C6 level. No severe osseous central canal stenosis. No acute  fracture. No aggressive appearing focal osseous lesion or focal pathologic process. Soft tissues and spinal canal: No prevertebral fluid or swelling. No visible canal hematoma. Upper chest: Biapical pleural/pulmonary scarring with associated calcifications on the left. Chronic coarsened interstitial markings. Other: Atherosclerotic plaque of the carotid arteries within the neck. IMPRESSION: 1. No acute intracranial abnormality. 2. No acute displaced fracture or traumatic listhesis of the cervical spine. 3. Multilevel severe degenerative change of the spine with associated severe osseous neural foraminal stenosis of the right C4-C5 and C5-C6 level. Electronically Signed   By: Morgane  Naveau M.D.   On: 11/16/2023 23:11   CT Cervical Spine Wo Contrast Result Date: 11/16/2023 CLINICAL DATA:  Head trauma, minor (Age >= 65y); Neck trauma (Age >= 65y). Fall EXAM: CT HEAD WITHOUT CONTRAST CT CERVICAL SPINE WITHOUT CONTRAST TECHNIQUE: Multidetector CT imaging of the head and cervical spine was performed following the standard protocol without intravenous contrast. Multiplanar CT image reconstructions of the cervical spine were also generated. RADIATION DOSE REDUCTION: This exam was performed according to the departmental dose-optimization program which includes automated exposure control, adjustment of the mA and/or kV according to patient size and/or use of iterative reconstruction technique. COMPARISON:  CT head 12/06/2021, CT head and C-spine 10/22/2021 FINDINGS: CT HEAD FINDINGS Brain: Cerebral ventricle sizes are concordant with the degree of cerebral volume loss. Patchy and confluent areas of decreased attenuation are noted throughout the deep and periventricular white matter of the cerebral hemispheres bilaterally, compatible with chronic microvascular ischemic disease. No evidence of large-territorial acute infarction. No parenchymal hemorrhage. No mass lesion. No extra-axial collection. No mass effect or midline  shift. No hydrocephalus. Basilar cisterns are patent. Vascular: No hyperdense vessel. Atherosclerotic calcifications are present within the cavernous internal carotid and vertebral arteries. Skull: Old healed right orbital floor fracture. No acute fracture or focal lesion. Sinuses/Orbits: Paranasal sinuses and mastoid air cells are clear. Bilateral lens replacement. Otherwise the orbits are unremarkable. Other: None. CT CERVICAL SPINE FINDINGS Alignment: Grade 1 anterolisthesis of C2 on C3, grade 1 anterolisthesis of C3 on C4. Retrolisthesis of C4 on C5 and C5 on C6. Skull base and vertebrae: Multilevel severe degenerative change of the spine with associated severe osseous neural foraminal stenosis of the right C4-C5 and C5-C6 level. No severe osseous central canal stenosis. No acute fracture. No aggressive appearing focal osseous lesion or focal pathologic process. Soft tissues and spinal canal: No prevertebral fluid or swelling. No visible canal hematoma. Upper chest: Biapical pleural/pulmonary scarring with associated calcifications on the left. Chronic coarsened interstitial markings. Other: Atherosclerotic plaque of the carotid arteries within the neck. IMPRESSION: 1. No acute intracranial abnormality. 2. No acute displaced fracture or traumatic listhesis of the cervical spine. 3. Multilevel severe degenerative change of the spine with associated severe osseous neural foraminal stenosis of the right C4-C5 and C5-C6 level. Electronically Signed   By: Morgane  Naveau M.D.   On:  11/16/2023 23:11   DG Hip Unilat With Pelvis 2-3 Views Right Result Date: 11/16/2023 CLINICAL DATA:  fall, pain EXAM: DG HIP (WITH OR WITHOUT PELVIS) 2-3V RIGHT COMPARISON:  CT abdomen pelvis 03/06/2022 FINDINGS: Cortical irregularity with likely acute displaced and comminuted right inferior pubic rami fracture. There is no evidence of hip fracture or dislocation of the right hip. No acute displaced fracture or dislocation of the left  hip. No diastasis of the bones of the pelvis. Surgical hardware noted along the lower visualized lumbar spine. There is no evidence of arthropathy or other focal bone abnormality. Vascular calcifications. IMPRESSION: Cortical irregularity with likely acute displaced and comminuted right inferior pubic rami fracture. Electronically Signed   By: Morgane  Naveau M.D.   On: 11/16/2023 23:05    Assessment/Plan   Right pelvic fractures with hematoma, post-angioembolization Sustained right superior and inferior pubic rami fractures with pelvic hematoma and active extravasation. Underwent angioembolization on November 17, 2023. Currently weight-bearing as tolerated.  -no issues with pain -has reduced mobility but is making gains   Chronic heart failure with reduced ejection fraction Congestive heart failure with reduced ejection fraction (45-50%) and hypokinesis. Last echocardiogram on September 24, 2023.  Received prn lasix  today  Atrial fibrillation Rate controlled on metoprolol  Off eliquis  as above Followed by cardiology   Chronic interstitial lung disease  Chronic DOE Hx of RA Not on oxygen  Chronic anemia secondary to blood loss and also iron  def Improving On iron  Follow CBC  Dementia Progressive decline in cognition and physical function c/w the disease. Continue supportive care in the skilled environment.  Rheumatoid arthritis Rheumatoid arthritis managed with Arava  and Enbrel.  HTN Controlled   Hyperlipidemia Off lipitor per cardiology   Last LDL in January was 41.  Hypothyroidism Hypothyroidism with last TSH in June 2025 at 2.92. Continuing thyroid  supplementation. - Continue thyroid  supplementation.  History of colon carcinoma, under oncology surveillance History of colon carcinoma. Last CEA was 6.51 on Aug 13, 2023. Under oncology surveillance.  Insomnia On remeron  7.5 mg Improvement noted in sleep pattern    CBC CMP Iron  panel Monday 01/13/24

## 2024-01-13 DIAGNOSIS — M6281 Muscle weakness (generalized): Secondary | ICD-10-CM | POA: Diagnosis not present

## 2024-01-13 DIAGNOSIS — R2681 Unsteadiness on feet: Secondary | ICD-10-CM | POA: Diagnosis not present

## 2024-01-13 DIAGNOSIS — R2689 Other abnormalities of gait and mobility: Secondary | ICD-10-CM | POA: Diagnosis not present

## 2024-01-13 DIAGNOSIS — D649 Anemia, unspecified: Secondary | ICD-10-CM | POA: Diagnosis not present

## 2024-01-13 DIAGNOSIS — S32810S Multiple fractures of pelvis with stable disruption of pelvic ring, sequela: Secondary | ICD-10-CM | POA: Diagnosis not present

## 2024-01-13 DIAGNOSIS — M25551 Pain in right hip: Secondary | ICD-10-CM | POA: Diagnosis not present

## 2024-01-13 DIAGNOSIS — R41841 Cognitive communication deficit: Secondary | ICD-10-CM | POA: Diagnosis not present

## 2024-01-13 DIAGNOSIS — I10 Essential (primary) hypertension: Secondary | ICD-10-CM | POA: Diagnosis not present

## 2024-01-14 DIAGNOSIS — M6281 Muscle weakness (generalized): Secondary | ICD-10-CM | POA: Diagnosis not present

## 2024-01-14 DIAGNOSIS — S32810S Multiple fractures of pelvis with stable disruption of pelvic ring, sequela: Secondary | ICD-10-CM | POA: Diagnosis not present

## 2024-01-14 DIAGNOSIS — M25551 Pain in right hip: Secondary | ICD-10-CM | POA: Diagnosis not present

## 2024-01-14 DIAGNOSIS — R2681 Unsteadiness on feet: Secondary | ICD-10-CM | POA: Diagnosis not present

## 2024-01-14 DIAGNOSIS — R41841 Cognitive communication deficit: Secondary | ICD-10-CM | POA: Diagnosis not present

## 2024-01-14 DIAGNOSIS — R2689 Other abnormalities of gait and mobility: Secondary | ICD-10-CM | POA: Diagnosis not present

## 2024-01-15 DIAGNOSIS — S32810S Multiple fractures of pelvis with stable disruption of pelvic ring, sequela: Secondary | ICD-10-CM | POA: Diagnosis not present

## 2024-01-15 DIAGNOSIS — M25551 Pain in right hip: Secondary | ICD-10-CM | POA: Diagnosis not present

## 2024-01-15 DIAGNOSIS — R2689 Other abnormalities of gait and mobility: Secondary | ICD-10-CM | POA: Diagnosis not present

## 2024-01-15 DIAGNOSIS — R41841 Cognitive communication deficit: Secondary | ICD-10-CM | POA: Diagnosis not present

## 2024-01-15 DIAGNOSIS — R2681 Unsteadiness on feet: Secondary | ICD-10-CM | POA: Diagnosis not present

## 2024-01-15 DIAGNOSIS — M6281 Muscle weakness (generalized): Secondary | ICD-10-CM | POA: Diagnosis not present

## 2024-01-16 DIAGNOSIS — R41841 Cognitive communication deficit: Secondary | ICD-10-CM | POA: Diagnosis not present

## 2024-01-16 DIAGNOSIS — M6281 Muscle weakness (generalized): Secondary | ICD-10-CM | POA: Diagnosis not present

## 2024-01-16 DIAGNOSIS — S32810S Multiple fractures of pelvis with stable disruption of pelvic ring, sequela: Secondary | ICD-10-CM | POA: Diagnosis not present

## 2024-01-16 DIAGNOSIS — R2689 Other abnormalities of gait and mobility: Secondary | ICD-10-CM | POA: Diagnosis not present

## 2024-01-16 DIAGNOSIS — M25551 Pain in right hip: Secondary | ICD-10-CM | POA: Diagnosis not present

## 2024-01-16 DIAGNOSIS — R2681 Unsteadiness on feet: Secondary | ICD-10-CM | POA: Diagnosis not present

## 2024-01-17 DIAGNOSIS — R41841 Cognitive communication deficit: Secondary | ICD-10-CM | POA: Diagnosis not present

## 2024-01-17 DIAGNOSIS — M6281 Muscle weakness (generalized): Secondary | ICD-10-CM | POA: Diagnosis not present

## 2024-01-17 DIAGNOSIS — M25551 Pain in right hip: Secondary | ICD-10-CM | POA: Diagnosis not present

## 2024-01-17 DIAGNOSIS — R2681 Unsteadiness on feet: Secondary | ICD-10-CM | POA: Diagnosis not present

## 2024-01-17 DIAGNOSIS — R2689 Other abnormalities of gait and mobility: Secondary | ICD-10-CM | POA: Diagnosis not present

## 2024-01-17 DIAGNOSIS — S32810S Multiple fractures of pelvis with stable disruption of pelvic ring, sequela: Secondary | ICD-10-CM | POA: Diagnosis not present

## 2024-01-20 DIAGNOSIS — M6281 Muscle weakness (generalized): Secondary | ICD-10-CM | POA: Diagnosis not present

## 2024-01-20 DIAGNOSIS — S32810S Multiple fractures of pelvis with stable disruption of pelvic ring, sequela: Secondary | ICD-10-CM | POA: Diagnosis not present

## 2024-01-20 DIAGNOSIS — R41841 Cognitive communication deficit: Secondary | ICD-10-CM | POA: Diagnosis not present

## 2024-01-20 DIAGNOSIS — R2681 Unsteadiness on feet: Secondary | ICD-10-CM | POA: Diagnosis not present

## 2024-01-20 DIAGNOSIS — R2689 Other abnormalities of gait and mobility: Secondary | ICD-10-CM | POA: Diagnosis not present

## 2024-01-20 DIAGNOSIS — M25551 Pain in right hip: Secondary | ICD-10-CM | POA: Diagnosis not present

## 2024-01-21 DIAGNOSIS — E785 Hyperlipidemia, unspecified: Secondary | ICD-10-CM | POA: Diagnosis not present

## 2024-01-21 DIAGNOSIS — R2689 Other abnormalities of gait and mobility: Secondary | ICD-10-CM | POA: Diagnosis not present

## 2024-01-21 DIAGNOSIS — S32810S Multiple fractures of pelvis with stable disruption of pelvic ring, sequela: Secondary | ICD-10-CM | POA: Diagnosis not present

## 2024-01-21 DIAGNOSIS — R2681 Unsteadiness on feet: Secondary | ICD-10-CM | POA: Diagnosis not present

## 2024-01-21 DIAGNOSIS — M25551 Pain in right hip: Secondary | ICD-10-CM | POA: Diagnosis not present

## 2024-01-21 DIAGNOSIS — R41841 Cognitive communication deficit: Secondary | ICD-10-CM | POA: Diagnosis not present

## 2024-01-21 DIAGNOSIS — M6281 Muscle weakness (generalized): Secondary | ICD-10-CM | POA: Diagnosis not present

## 2024-01-22 DIAGNOSIS — R2689 Other abnormalities of gait and mobility: Secondary | ICD-10-CM | POA: Diagnosis not present

## 2024-01-22 DIAGNOSIS — M6281 Muscle weakness (generalized): Secondary | ICD-10-CM | POA: Diagnosis not present

## 2024-01-22 DIAGNOSIS — S32810S Multiple fractures of pelvis with stable disruption of pelvic ring, sequela: Secondary | ICD-10-CM | POA: Diagnosis not present

## 2024-01-22 DIAGNOSIS — R2681 Unsteadiness on feet: Secondary | ICD-10-CM | POA: Diagnosis not present

## 2024-01-22 DIAGNOSIS — R41841 Cognitive communication deficit: Secondary | ICD-10-CM | POA: Diagnosis not present

## 2024-01-22 DIAGNOSIS — M25551 Pain in right hip: Secondary | ICD-10-CM | POA: Diagnosis not present

## 2024-01-23 DIAGNOSIS — R2681 Unsteadiness on feet: Secondary | ICD-10-CM | POA: Diagnosis not present

## 2024-01-23 DIAGNOSIS — S32810S Multiple fractures of pelvis with stable disruption of pelvic ring, sequela: Secondary | ICD-10-CM | POA: Diagnosis not present

## 2024-01-23 DIAGNOSIS — M25551 Pain in right hip: Secondary | ICD-10-CM | POA: Diagnosis not present

## 2024-01-23 DIAGNOSIS — M6281 Muscle weakness (generalized): Secondary | ICD-10-CM | POA: Diagnosis not present

## 2024-01-23 DIAGNOSIS — R2689 Other abnormalities of gait and mobility: Secondary | ICD-10-CM | POA: Diagnosis not present

## 2024-01-23 DIAGNOSIS — R41841 Cognitive communication deficit: Secondary | ICD-10-CM | POA: Diagnosis not present

## 2024-01-24 DIAGNOSIS — R2681 Unsteadiness on feet: Secondary | ICD-10-CM | POA: Diagnosis not present

## 2024-01-24 DIAGNOSIS — R41841 Cognitive communication deficit: Secondary | ICD-10-CM | POA: Diagnosis not present

## 2024-01-24 DIAGNOSIS — R2689 Other abnormalities of gait and mobility: Secondary | ICD-10-CM | POA: Diagnosis not present

## 2024-01-24 DIAGNOSIS — M6281 Muscle weakness (generalized): Secondary | ICD-10-CM | POA: Diagnosis not present

## 2024-01-24 DIAGNOSIS — S32810S Multiple fractures of pelvis with stable disruption of pelvic ring, sequela: Secondary | ICD-10-CM | POA: Diagnosis not present

## 2024-01-24 DIAGNOSIS — M25551 Pain in right hip: Secondary | ICD-10-CM | POA: Diagnosis not present

## 2024-01-27 ENCOUNTER — Ambulatory Visit: Attending: Cardiovascular Disease | Admitting: Cardiovascular Disease

## 2024-01-27 ENCOUNTER — Encounter: Payer: Self-pay | Admitting: Cardiovascular Disease

## 2024-01-27 VITALS — BP 124/60 | HR 79 | Ht 65.0 in | Wt 126.8 lb

## 2024-01-27 DIAGNOSIS — M25551 Pain in right hip: Secondary | ICD-10-CM | POA: Diagnosis not present

## 2024-01-27 DIAGNOSIS — D62 Acute posthemorrhagic anemia: Secondary | ICD-10-CM | POA: Diagnosis not present

## 2024-01-27 DIAGNOSIS — I5033 Acute on chronic diastolic (congestive) heart failure: Secondary | ICD-10-CM

## 2024-01-27 DIAGNOSIS — Z9181 History of falling: Secondary | ICD-10-CM | POA: Diagnosis not present

## 2024-01-27 DIAGNOSIS — Z9889 Other specified postprocedural states: Secondary | ICD-10-CM | POA: Insufficient documentation

## 2024-01-27 DIAGNOSIS — I10 Essential (primary) hypertension: Secondary | ICD-10-CM | POA: Diagnosis not present

## 2024-01-27 DIAGNOSIS — E039 Hypothyroidism, unspecified: Secondary | ICD-10-CM | POA: Insufficient documentation

## 2024-01-27 DIAGNOSIS — I4821 Permanent atrial fibrillation: Secondary | ICD-10-CM | POA: Diagnosis not present

## 2024-01-27 DIAGNOSIS — I5032 Chronic diastolic (congestive) heart failure: Secondary | ICD-10-CM | POA: Diagnosis not present

## 2024-01-27 DIAGNOSIS — M6281 Muscle weakness (generalized): Secondary | ICD-10-CM | POA: Diagnosis not present

## 2024-01-27 DIAGNOSIS — E7800 Pure hypercholesterolemia, unspecified: Secondary | ICD-10-CM | POA: Diagnosis not present

## 2024-01-27 DIAGNOSIS — R41841 Cognitive communication deficit: Secondary | ICD-10-CM | POA: Diagnosis not present

## 2024-01-27 DIAGNOSIS — R2689 Other abnormalities of gait and mobility: Secondary | ICD-10-CM | POA: Diagnosis not present

## 2024-01-27 DIAGNOSIS — R2681 Unsteadiness on feet: Secondary | ICD-10-CM | POA: Diagnosis not present

## 2024-01-27 DIAGNOSIS — S32810S Multiple fractures of pelvis with stable disruption of pelvic ring, sequela: Secondary | ICD-10-CM | POA: Diagnosis not present

## 2024-01-27 DIAGNOSIS — M25552 Pain in left hip: Secondary | ICD-10-CM | POA: Diagnosis not present

## 2024-01-27 MED ORDER — FUROSEMIDE 40 MG PO TABS
ORAL_TABLET | ORAL | 3 refills | Status: AC
Start: 2024-01-27 — End: ?

## 2024-01-27 NOTE — Patient Instructions (Addendum)
 Medication Instructions:  Take Furosemide  40 mg every Tuesday and Saturday *If you need a refill on your cardiac medications before your next appointment, please call your pharmacy*  Lab Work: BMP, BNP, CBC= In 10 days= 02/06/24 If you have labs (blood work) drawn today and your tests are completely normal, you will receive your results only by: MyChart Message (if you have MyChart) OR A paper copy in the mail If you have any lab test that is abnormal or we need to change your treatment, we will call you to review the results.  Testing/Procedures: None ordered  Follow-Up: At [facility], you and your health needs are our priority.  As part of our continuing mission to provide you with exceptional heart care, our providers are all part of one team.  This team includes your primary Cardiologist (physician) and Advanced Practice Providers or APPs (Physician Assistants and Nurse Practitioners) who all work together to provide you with the care you need, when you need it.  Your next appointment:   3 month(s)  Provider:   Jerel Balding, MD    We recommend signing up for the patient portal called MyChart.  Sign up information is provided on this After Visit Summary.  MyChart is used to connect with patients for Virtual Visits (Telemedicine).  Patients are able to view lab/test results, encounter notes, upcoming appointments, etc.  Non-urgent messages can be sent to your provider as well.   To learn more about what you can do with MyChart, go to [email or web address].

## 2024-01-27 NOTE — Progress Notes (Unsigned)
 Speech Cardiology Office Note:  .   Date:  01/27/2024  ID:  Kathy Velez, DOB 1931/05/23, MRN [id number] PCP: Charlanne Fredia CROME, MD  [facility] Providers Cardiologist:  Jerel Balding, MD    History of Present Illness: .   Kathy Velez is a 89 y.o. female with paroxysmal atrial fibrillation, history of stroke, vascular dementia, carotid artery disease, aortic atherosclerosis, hypertension, history of mitral and tricuspid valve repair (MV #30 Physio Ring; TV # 28 TriAd Ring 2014, [place]), chronic interstitial lung disease, history of colon cancer in remission, GERD, treated hypothyroidism, hyponatremia (probable SIADH).  She is accompanied by her daughter Adin.  She lives at [place], and the skilled nursing facility due to her worsening memory issues.  After stopping the salt tablets and placing her on 2000 mL fluid restriction and a weight-based furosemide  prescription, her breathing has improved (it has returned back to her NYHA functional class II baseline from about 6 months ago) and her sodium level has normalized.  She takes furosemide  if her weight is 125 pounds or higher, which happens on the average every other day.  She frequently has mild edema, with her elastic socks leaving some marks.  She does not have orthopnea or PND.  She has not had any dizziness or syncope and has not had any falls in over a year.  She does not add salt to her food but does sometimes have salty foods such as bacon at breakfast.  She has vascular dementia: still knows her daughter and grandchildren and is able to hold a coherent conversation.  Does not appear short of breath at rest.  She does have a history of several TIAs in the past and one major stroke presenting with a fall and aphasia in January 2021.  CT angiography at that time showed no significant large vessel stenoses in the cerebral circulation but there was mild atherosclerotic calcification scattered throughout.  Last performed about a week  ago showed normal electrolytes (sodium 137) and renal function parameters but an elevated TSH at approximately 12.  All other lipid parameters are excellent.  Studies Reviewed: .        Echocardiogram 02/12/2023    1. Mitral Valve repair: Gore-tex cords to P1, P2, and P3. Magic suture to  posterior medial commisure, #30 Physio 1 ring. Done in [place] . Tricuspid  Valve repair #28 Triad ring done in [place] . Procedure Date: 01/20/2013.   2. Right ventricular systolic function is mildly reduced. The right  ventricular size is moderately enlarged. There is moderately elevated  pulmonary artery systolic pressure.   3. The aortic valve is tricuspid. Aortic valve regurgitation is not  visualized. No aortic stenosis is present.   4. Mildly dilated pulmonary artery.   5. Left ventricular ejection fraction, by estimation, is 55 to 60%. Left  ventricular ejection fraction by 3D volume is 54 %. The left ventricle has  normal function. The left ventricle demonstrates global hypokinesis. Left  ventricular diastolic function  could not be evaluated. There is the interventricular septum is flattened  in systole, consistent with right ventricular pressure overload.   6. Right atrial size was severely dilated.   7. Left atrial size was severely dilated.   8. The tricuspid valve is status post repair with an annuloplasty ring.   Comparison(s): No significant change from prior study. Prior images  reviewed side by side.   Labs from [facility]  04/04/2023 creatinine 0.85, sodium 137, potassium 4.4, normal liver function tests, mildly reduced osmolality 278 (lab normal  range 285-295), hemoglobin 11.0 with macrocytic indices normal WBC and platelet counts, TSH 12.6 Cholesterol 108, triglycerides 93, HDL 49, LDL 41  Risk Assessment/Calculations:    CHA2DS2-VASc Score =     This indicates a  % annual risk of stroke. The patient's score is based upon:     {This patient has a significant risk of  stroke if diagnosed with atrial fibrillation.  Please consider VKA or DOAC agent for anticoagulation if the bleeding risk is acceptable.   You can also use the SmartPhrase .[email or web address] for documentation.   :[id number]} No BP recorded.  {Refresh Note OR Click here to enter BP  :1}***       Physical Exam:   VS:  Pulse 79   Ht 5' 5 (1.651 m)   Wt 126 lb 12.8 oz (57.5 kg)   SpO2 94%   BMI 21.10 kg/m    Wt Readings from Last 3 Encounters:  01/27/24 126 lb 12.8 oz (57.5 kg)  01/10/24 125 lb 12.8 oz (57.1 kg)  12/02/23 126 lb (57.2 kg)    GEN: Well nourished, well developed in no acute distress NECK: 3-4 cm JVD; No carotid bruits CARDIAC: irregular, no murmurs, rubs, gallops RESPIRATORY:  Clear to auscultation without rales, wheezing or rhonchi  ABDOMEN: Soft, non-tender, non-distended EXTREMITIES: 1+ pitting pedal, ankle and pretibial edema bilaterally  ASSESSMENT AND PLAN: .    No diagnosis found.      AFib: Permanent arrhythmia.  Severe biatrial dilation.  On beta-blockers with adequate rate control.   Anticoagulation: No recent falls or bleeding problems.  Eliquis  dose adjusted for age and small body size. Stroke risk: Severely elevated risk with a CHA2DS2-VASc score of 8 and previous history of stroke.  No falls in over a year and no serious bleeding since we started Eliquis . CHF: Mild signs of hypervolemia on exam.  Continues to have NYHA functional class 2-3 dyspnea.    Continue fluid restriction, daily weights and weight-based furosemide  prescription (take 20 mg on days when weight is greater than 124 pounds). Hyponatremia: Has resolved.  S/P MV and TV repair: No angina echo findings.  Repairs are holding up nicely.  No mitral stenosis. HTN: Adequately controlled.  Continue same medications. HLP: All recent lipid parameters are within target range. Hypothyroidism: Recent TSH was elevated at 12 while taking levothyroxine  75 mcg daily.  Not longer though she had a somewhat  suppressed TSH when taking 100 mcg daily.  It may be that the optimal dose is 88 mcg daily.  Leave that decision to Dr. Charlanne.  Aware of the need to take the medication on an empty stomach.       Dispo: No change in medications, fluid restriction and daily weight monitoring recommendations.  Consider increasing levothyroxine  dose 88 mcg daily.  Signed, Jerel Balding, MD

## 2024-01-28 DIAGNOSIS — M6281 Muscle weakness (generalized): Secondary | ICD-10-CM | POA: Diagnosis not present

## 2024-01-28 DIAGNOSIS — R41841 Cognitive communication deficit: Secondary | ICD-10-CM | POA: Diagnosis not present

## 2024-01-28 DIAGNOSIS — S32810S Multiple fractures of pelvis with stable disruption of pelvic ring, sequela: Secondary | ICD-10-CM | POA: Diagnosis not present

## 2024-01-28 DIAGNOSIS — R2689 Other abnormalities of gait and mobility: Secondary | ICD-10-CM | POA: Diagnosis not present

## 2024-01-28 DIAGNOSIS — R2681 Unsteadiness on feet: Secondary | ICD-10-CM | POA: Diagnosis not present

## 2024-01-28 DIAGNOSIS — M25551 Pain in right hip: Secondary | ICD-10-CM | POA: Diagnosis not present

## 2024-01-29 DIAGNOSIS — R41841 Cognitive communication deficit: Secondary | ICD-10-CM | POA: Diagnosis not present

## 2024-01-29 DIAGNOSIS — M25551 Pain in right hip: Secondary | ICD-10-CM | POA: Diagnosis not present

## 2024-01-29 DIAGNOSIS — R2681 Unsteadiness on feet: Secondary | ICD-10-CM | POA: Diagnosis not present

## 2024-01-29 DIAGNOSIS — R2689 Other abnormalities of gait and mobility: Secondary | ICD-10-CM | POA: Diagnosis not present

## 2024-01-29 DIAGNOSIS — M6281 Muscle weakness (generalized): Secondary | ICD-10-CM | POA: Diagnosis not present

## 2024-01-29 DIAGNOSIS — S32810S Multiple fractures of pelvis with stable disruption of pelvic ring, sequela: Secondary | ICD-10-CM | POA: Diagnosis not present

## 2024-01-30 DIAGNOSIS — R2681 Unsteadiness on feet: Secondary | ICD-10-CM | POA: Diagnosis not present

## 2024-01-30 DIAGNOSIS — M25551 Pain in right hip: Secondary | ICD-10-CM | POA: Diagnosis not present

## 2024-01-30 DIAGNOSIS — R41841 Cognitive communication deficit: Secondary | ICD-10-CM | POA: Diagnosis not present

## 2024-01-30 DIAGNOSIS — M6281 Muscle weakness (generalized): Secondary | ICD-10-CM | POA: Diagnosis not present

## 2024-01-30 DIAGNOSIS — S32810S Multiple fractures of pelvis with stable disruption of pelvic ring, sequela: Secondary | ICD-10-CM | POA: Diagnosis not present

## 2024-01-30 DIAGNOSIS — R2689 Other abnormalities of gait and mobility: Secondary | ICD-10-CM | POA: Diagnosis not present

## 2024-01-31 DIAGNOSIS — R2681 Unsteadiness on feet: Secondary | ICD-10-CM | POA: Diagnosis not present

## 2024-01-31 DIAGNOSIS — R2689 Other abnormalities of gait and mobility: Secondary | ICD-10-CM | POA: Diagnosis not present

## 2024-01-31 DIAGNOSIS — S32810S Multiple fractures of pelvis with stable disruption of pelvic ring, sequela: Secondary | ICD-10-CM | POA: Diagnosis not present

## 2024-01-31 DIAGNOSIS — M25551 Pain in right hip: Secondary | ICD-10-CM | POA: Diagnosis not present

## 2024-01-31 DIAGNOSIS — M6281 Muscle weakness (generalized): Secondary | ICD-10-CM | POA: Diagnosis not present

## 2024-01-31 DIAGNOSIS — R41841 Cognitive communication deficit: Secondary | ICD-10-CM | POA: Diagnosis not present

## 2024-02-01 DIAGNOSIS — R2689 Other abnormalities of gait and mobility: Secondary | ICD-10-CM | POA: Diagnosis not present

## 2024-02-01 DIAGNOSIS — M25551 Pain in right hip: Secondary | ICD-10-CM | POA: Diagnosis not present

## 2024-02-01 DIAGNOSIS — R41841 Cognitive communication deficit: Secondary | ICD-10-CM | POA: Diagnosis not present

## 2024-02-01 DIAGNOSIS — S32810S Multiple fractures of pelvis with stable disruption of pelvic ring, sequela: Secondary | ICD-10-CM | POA: Diagnosis not present

## 2024-02-01 DIAGNOSIS — M6281 Muscle weakness (generalized): Secondary | ICD-10-CM | POA: Diagnosis not present

## 2024-02-01 DIAGNOSIS — R2681 Unsteadiness on feet: Secondary | ICD-10-CM | POA: Diagnosis not present

## 2024-02-03 DIAGNOSIS — R41841 Cognitive communication deficit: Secondary | ICD-10-CM | POA: Diagnosis not present

## 2024-02-03 DIAGNOSIS — M25551 Pain in right hip: Secondary | ICD-10-CM | POA: Diagnosis not present

## 2024-02-03 DIAGNOSIS — R2681 Unsteadiness on feet: Secondary | ICD-10-CM | POA: Diagnosis not present

## 2024-02-03 DIAGNOSIS — R2689 Other abnormalities of gait and mobility: Secondary | ICD-10-CM | POA: Diagnosis not present

## 2024-02-03 DIAGNOSIS — S32810S Multiple fractures of pelvis with stable disruption of pelvic ring, sequela: Secondary | ICD-10-CM | POA: Diagnosis not present

## 2024-02-03 DIAGNOSIS — M6281 Muscle weakness (generalized): Secondary | ICD-10-CM | POA: Diagnosis not present

## 2024-02-04 DIAGNOSIS — R2689 Other abnormalities of gait and mobility: Secondary | ICD-10-CM | POA: Diagnosis not present

## 2024-02-04 DIAGNOSIS — R2681 Unsteadiness on feet: Secondary | ICD-10-CM | POA: Diagnosis not present

## 2024-02-04 DIAGNOSIS — R41841 Cognitive communication deficit: Secondary | ICD-10-CM | POA: Diagnosis not present

## 2024-02-04 DIAGNOSIS — M25551 Pain in right hip: Secondary | ICD-10-CM | POA: Diagnosis not present

## 2024-02-04 DIAGNOSIS — S32810S Multiple fractures of pelvis with stable disruption of pelvic ring, sequela: Secondary | ICD-10-CM | POA: Diagnosis not present

## 2024-02-04 DIAGNOSIS — M6281 Muscle weakness (generalized): Secondary | ICD-10-CM | POA: Diagnosis not present

## 2024-02-05 DIAGNOSIS — R41841 Cognitive communication deficit: Secondary | ICD-10-CM | POA: Diagnosis not present

## 2024-02-05 DIAGNOSIS — R2681 Unsteadiness on feet: Secondary | ICD-10-CM | POA: Diagnosis not present

## 2024-02-05 DIAGNOSIS — R2689 Other abnormalities of gait and mobility: Secondary | ICD-10-CM | POA: Diagnosis not present

## 2024-02-05 DIAGNOSIS — M6281 Muscle weakness (generalized): Secondary | ICD-10-CM | POA: Diagnosis not present

## 2024-02-05 DIAGNOSIS — M25551 Pain in right hip: Secondary | ICD-10-CM | POA: Diagnosis not present

## 2024-02-05 DIAGNOSIS — S32810S Multiple fractures of pelvis with stable disruption of pelvic ring, sequela: Secondary | ICD-10-CM | POA: Diagnosis not present

## 2024-02-06 ENCOUNTER — Encounter: Payer: Self-pay | Admitting: Adult Health

## 2024-02-06 ENCOUNTER — Non-Acute Institutional Stay (SKILLED_NURSING_FACILITY): Payer: Self-pay | Admitting: Adult Health

## 2024-02-06 DIAGNOSIS — S32810S Multiple fractures of pelvis with stable disruption of pelvic ring, sequela: Secondary | ICD-10-CM | POA: Diagnosis not present

## 2024-02-06 DIAGNOSIS — R41841 Cognitive communication deficit: Secondary | ICD-10-CM | POA: Diagnosis not present

## 2024-02-06 DIAGNOSIS — R062 Wheezing: Secondary | ICD-10-CM | POA: Diagnosis not present

## 2024-02-06 DIAGNOSIS — M25551 Pain in right hip: Secondary | ICD-10-CM | POA: Diagnosis not present

## 2024-02-06 DIAGNOSIS — D509 Iron deficiency anemia, unspecified: Secondary | ICD-10-CM

## 2024-02-06 DIAGNOSIS — B9789 Other viral agents as the cause of diseases classified elsewhere: Secondary | ICD-10-CM | POA: Diagnosis not present

## 2024-02-06 DIAGNOSIS — J988 Other specified respiratory disorders: Secondary | ICD-10-CM

## 2024-02-06 DIAGNOSIS — M6281 Muscle weakness (generalized): Secondary | ICD-10-CM | POA: Diagnosis not present

## 2024-02-06 DIAGNOSIS — I5043 Acute on chronic combined systolic (congestive) and diastolic (congestive) heart failure: Secondary | ICD-10-CM

## 2024-02-06 DIAGNOSIS — N189 Chronic kidney disease, unspecified: Secondary | ICD-10-CM | POA: Diagnosis not present

## 2024-02-06 DIAGNOSIS — R2689 Other abnormalities of gait and mobility: Secondary | ICD-10-CM | POA: Diagnosis not present

## 2024-02-06 DIAGNOSIS — I10 Essential (primary) hypertension: Secondary | ICD-10-CM

## 2024-02-06 DIAGNOSIS — R2681 Unsteadiness on feet: Secondary | ICD-10-CM | POA: Diagnosis not present

## 2024-02-06 LAB — LAB REPORT - SCANNED: EGFR: 82

## 2024-02-07 ENCOUNTER — Other Ambulatory Visit: Payer: Self-pay | Admitting: Emergency Medicine

## 2024-02-07 ENCOUNTER — Telehealth: Payer: Self-pay | Admitting: Cardiovascular Disease

## 2024-02-07 ENCOUNTER — Ambulatory Visit: Payer: Self-pay | Admitting: Cardiovascular Disease

## 2024-02-07 DIAGNOSIS — M6281 Muscle weakness (generalized): Secondary | ICD-10-CM | POA: Diagnosis not present

## 2024-02-07 DIAGNOSIS — R2689 Other abnormalities of gait and mobility: Secondary | ICD-10-CM | POA: Diagnosis not present

## 2024-02-07 DIAGNOSIS — E039 Hypothyroidism, unspecified: Secondary | ICD-10-CM

## 2024-02-07 DIAGNOSIS — M25551 Pain in right hip: Secondary | ICD-10-CM | POA: Diagnosis not present

## 2024-02-07 DIAGNOSIS — R41841 Cognitive communication deficit: Secondary | ICD-10-CM | POA: Diagnosis not present

## 2024-02-07 DIAGNOSIS — S32810S Multiple fractures of pelvis with stable disruption of pelvic ring, sequela: Secondary | ICD-10-CM | POA: Diagnosis not present

## 2024-02-07 DIAGNOSIS — R2681 Unsteadiness on feet: Secondary | ICD-10-CM | POA: Diagnosis not present

## 2024-02-07 DIAGNOSIS — Z79899 Other long term (current) drug therapy: Secondary | ICD-10-CM

## 2024-02-07 MED ORDER — LORATADINE 10 MG PO TABS: 10.0000 mg | ORAL_TABLET | Freq: Every day | ORAL | Status: DC

## 2024-02-07 NOTE — Progress Notes (Unsigned)
 Croitoru, Jerel, MD  Davee Comer CROME, RN Can we please add a TSH to the labs she is having drawn at [facility] next week?  Faxed orders for a TSH to be drawn to [facility]- Fax [phone]- Skilled Care Nurse 1

## 2024-02-07 NOTE — Progress Notes (Signed)
 Location:  Medical Illustrator of Service:  SNF (31) Provider:   Bari [place], ANP [facility] [phone]   Charlanne Fredia CROME, MD  Patient Care Team: Charlanne Fredia CROME, MD as PCP - General (Internal Medicine) Croitoru, Jerel, MD as PCP - Cardiology (Cardiology) [name], DO as Consulting Physician (Neurology)  Extended Emergency Contact Information Primary Emergency Contact: [name],[name] Address: [address]          [place], [place] [address] [place] Home Phone: [phone] Mobile Phone: [phone] Relation: Daughter Secondary Emergency Contact: [name] Address: [address]          [place], [place] [address] [place] Phone: [phone] Relation: Son  Code Status:  DNR Goals of care: Advanced Directive information    11/26/2023    2:37 PM  Advanced Directives  Does Patient Have a Medical Advance Directive? Yes  Type of Estate Agent of [place];Living will  Does patient want to make changes to medical advance directive? No - Patient declined  Copy of Healthcare Power of Attorney in Chart? Yes - validated most recent copy scanned in chart (See row information)     Chief Complaint  Patient presents with   Acute Visit    wheezing    HPI:   The patient is a 89 year old with congestive heart failure who presents with wheezing, nasal congestion, and shortness of breath.  Respiratory symptoms - Wheezing and shortness of breath have worsened over the past 24 hours - Nasal congestion present for a couple of weeks, progressing to wheezing and dyspnea - Chronic dyspnea on exertion due to interstitial lung disease and congestive heart failure - No evidence of pneumonia on chest x-ray, but perihilar inflammation is present - COVID and influenza tests are negative - Recent exposure to circulating parainfluenza in the skilled care area  Volume status and weight changes -  Weight decreased by two pounds to 122lbs  - Recent cardiology evaluation on November 3rd resulted in initiation of Lasix  40 mg twice a week  Laboratory and imaging findings - BNP elevated at 7337 -prior level 5297 09/10/23 and 2899 09/19/23 after diuresis  - White blood cell count 6 - Hemoglobin 10.4 - Platelets 158 - BUN 17.9 - Creatinine 0.7 - Sodium 133  Past Medical History:  Diagnosis Date   Allergy    seasonal   Anemia    Anxiety    Arthritis    Back   Asymptomatic menopausal state    Atrial fibrillation (HCC)    Cancer (HCC) 2006   Colon.  Basal Cell Skin cancer- right arm   Cataract    removed bilateral   Chronic atrial fibrillation (HCC)    Colon cancer (HCC)    Constipation due to pain medication therapy    after heart surgery   Deficiency of other specified B group vitamins    Diplopia    Disorientation, unspecified    Dysrhythmia    PAF   GERD (gastroesophageal reflux disease)    Heart murmur    Hyperlipidemia    Hypertension    Hypothyroidism    Malignant neoplasm of colon, unspecified (HCC)    Other specified diseases of blood and blood-forming organs    Other specified disorders of bone density and structure, unspecified forearm    Other thrombophilia    per matrix   Overactive bladder    per Matrix   Personal history of other malignant neoplasm of large  intestine    Presbycusis, bilateral    per matrix   RA (rheumatoid arthritis) (HCC)    Restless leg    Seizures (HCC)    after Heart Surgey   Stroke (HCC)    TIA- found by neurologist after    Valgus deformity, not elsewhere classified, right knee    per matrix   Vascular dementia, mild, without behavioral disturbance, psychotic disturbance, mood disturbance, and anxiety (HCC)    per matrix   Past Surgical History:  Procedure Laterality Date   ABDOMINAL HYSTERECTOMY  1970   Partial    COLON RESECTION  2006   cancer   COLON SURGERY     COLONOSCOPY     EYE SURGERY Bilateral    Cataract    IR ANGIOGRAM PELVIS SELECTIVE OR SUPRASELECTIVE  11/17/2023   IR ANGIOGRAM SELECTIVE EACH ADDITIONAL VESSEL  11/17/2023   IR EMBO ART  VEN HEMORR LYMPH EXTRAV  INC GUIDE ROADMAPPING  11/17/2023   IR US  GUIDE VASC ACCESS RIGHT  11/17/2023   MAXIMUM ACCESS (MAS)POSTERIOR LUMBAR INTERBODY FUSION (PLIF) 2 LEVEL N/A 04/12/2015   Procedure: Lumbar Three-Five Decompression, Pedicle Screw Fixation, and Posteriolateral Arthrodesis;  Surgeon: Fairy Levels, MD;  Location: [facility];  Service: Neurosurgery;  Laterality: N/A;  L3-4 L4-5 Maximum access posterior lumbar fusion, possible interbodies and resection of synovial cyst at L4-5   MITRAL VALVE REPAIR  01/20/2013   Gore-tex cords to P1, P2, and P3. Magic suture to posterior medial commisure, #30 Physio 1 ring. Done in [place]    TONSILLECTOMY     about 1940   TRICUSPID VALVE SURGERY  01/20/2013   #28 TriAd ring done in [place]     Allergies  Allergen Reactions   Claritin [Loratadine] Other (See Comments)    Listed on MAR Unknown reaction   Codeine Other (See Comments)    just don't take it well   Gabapentin  Other (See Comments)    Dizziness    Molds & Smuts Other (See Comments)    Dust.  Reaction is not listed on MAR    Pollen Extract Swelling    Grass   Bee Venom Swelling and Rash   Wasp Venom Swelling and Rash    Outpatient Encounter Medications as of 02/06/2024  Medication Sig   acetaminophen  (TYLENOL ) 500 MG tablet Take 2 tablets (1,000 mg total) by mouth every 6 (six) hours as needed for mild pain (pain score 1-3) or fever.   amoxicillin (AMOXIL) 500 MG capsule Take 2,000 mg by mouth as needed. (Patient not taking: Reported on 01/27/2024)   Cholecalciferol  (VITAMIN D ) 50 MCG (2000 UT) CAPS Take 2,000 Units by mouth in the morning.   cloNIDine  (CATAPRES ) 0.1 MG tablet Take 1 tablet (0.1 mg total) by mouth daily as needed. If SBP >180 only (Patient not taking: Reported on 01/27/2024)   docusate sodium  (COLACE) 100 MG capsule Take 100  mg by mouth daily as needed for mild constipation. (Patient not taking: Reported on 01/27/2024)   ENBREL SURECLICK 50 MG/ML injection Inject 50 mg into the skin once a week. Tuesday   furosemide  (LASIX ) 40 MG tablet Take 40 mg every Tuesday and Saturday   Iron , Ferrous Sulfate , 325 (65 Fe) MG TABS Take 325 mg by mouth daily.   leflunomide  (ARAVA ) 20 MG tablet Take 20 mg by mouth in the morning.   levalbuterol  (XOPENEX ) 0.63 MG/3ML nebulizer solution Take 3 mLs (0.63 mg total) by nebulization every 6 (six) hours as needed for wheezing or shortness of breath.  levothyroxine  (SYNTHROID ) 88 MCG tablet Take 88 mcg by mouth daily before breakfast.   melatonin 5 MG TABS Take 5 mg by mouth at bedtime.   methocarbamol  (ROBAXIN ) 500 MG tablet Take 1 tablet (500 mg total) by mouth every 8 (eight) hours as needed for muscle spasms (musculoskeletal pain). (Patient not taking: Reported on 01/27/2024)   metoprolol  tartrate (LOPRESSOR ) 100 MG tablet Take 150 mg by mouth daily. If heart rate <50 or SBP >180 or <100   mirtazapine  (REMERON ) 7.5 MG tablet Take 7.5 mg by mouth at bedtime.   Multiple Vitamin (MULTIVITAMIN ADULT PO) Take 1 tablet by mouth every morning.   Multiple Vitamins-Minerals (PRESERVISION AREDS 2) CAPS Take 1 capsule by mouth in the morning and at bedtime.   polyethylene glycol (MIRALAX  / GLYCOLAX ) 17 g packet Take 17 g by mouth daily as needed for moderate constipation (constipation). (Patient not taking: Reported on 01/27/2024)   traMADol  HCl 25 MG TABS Take 25 mg by mouth every 8 (eight) hours as needed. (Patient not taking: Reported on 01/27/2024)   traMADol  HCl 25 MG TABS Take 50 mg by mouth every 8 (eight) hours as needed. (Patient not taking: Reported on 01/27/2024)   UNABLE TO FIND Apply 1 Application topically every morning. JOBST HOSE NEED SIZE   valsartan  (DIOVAN ) 160 MG tablet Take 160 mg by mouth every evening.   vitamin B-12 (CYANOCOBALAMIN ) 1000 MCG tablet Take 1,000 mcg by mouth in the  morning.   No facility-administered encounter medications on file as of 02/06/2024.    Review of Systems  Constitutional:  Negative for activity change, appetite change, chills, diaphoresis, fatigue and fever.  HENT:  Positive for congestion and rhinorrhea. Negative for sneezing, sore throat and trouble swallowing.   Respiratory:  Positive for shortness of breath and wheezing. Negative for cough.   Cardiovascular:  Positive for leg swelling. Negative for chest pain.  Gastrointestinal:  Negative for abdominal distention, abdominal pain, constipation, diarrhea, nausea and vomiting.  Genitourinary:  Negative for difficulty urinating, dysuria and urgency.  Musculoskeletal:  Positive for arthralgias and gait problem. Negative for back pain, myalgias and neck pain.  Skin:  Negative for rash.  Neurological:  Negative for dizziness and weakness.  Psychiatric/Behavioral:  Negative for confusion.        Memory loss    Immunization History  Administered Date(s) Administered   Fluad Quad(high Dose 65+) 01/05/2019, 12/07/2021, 01/15/2023, 12/24/2023   Fluad Trivalent(High Dose 65+) 01/15/2023   H1N1 04/07/2008   INFLUENZA, HIGH DOSE SEASONAL PF 01/15/2012, 12/23/2014, 01/22/2020   Influenza Whole 12/29/2008, 12/22/2009   Influenza,inj,Quad PF,6+ Mos 01/22/2014, 12/10/2016, 01/17/2018   Influenza,trivalent, recombinat, inj, PF 01/05/2016   Influenza-Unspecified 04/07/2008, 12/15/2014, 01/13/2021   Moderna Covid-19 Vaccine  Bivalent Booster 60yrs & up 01/29/2022, 01/15/2023, 12/24/2023   Moderna Sars-Covid-2 Vaccination 04/07/2019, 05/05/2019, 02/09/2020   PFIZER(Purple Top)SARS-COV-2 Vaccination 01/06/2021   PPD Test 07/17/2010, 04/19/2015, 04/25/2015   Pfizer Covid-19 Vaccine Bivalent Booster 50yrs & up 07/19/2022   Pneumococcal Conjugate-13 11/10/2013   Pneumococcal Polysaccharide-23 12/27/2010, 12/05/2016   RSV,unspecified 03/28/2022   Tdap 04/15/2017   Zoster Recombinant(Shingrix)  04/09/2017, 06/18/2017   Pertinent  Health Maintenance Due  Topic Date Due   Influenza Vaccine  Completed   DEXA SCAN  Completed      08/29/2022    3:31 PM 04/18/2023   12:52 PM 10/04/2023   10:46 AM 10/23/2023    2:10 PM 11/27/2023   10:26 AM  Fall Risk  Falls in the past year? 1 0 0 0 1  Was there an injury with Fall? 1 0 0 0 1  Fall Risk Category Calculator 2 0 0 0 2  Patient at Risk for Falls Due to   History of fall(s) History of fall(s);Impaired balance/gait History of fall(s);Impaired balance/gait  Fall risk Follow up Falls evaluation completed Falls evaluation completed Falls evaluation completed Falls evaluation completed;Education provided Falls evaluation completed;Education provided   Functional Status Survey:    Vitals:   02/07/24 1438  BP: 129/83  Pulse: 89  Resp: 19  Temp: 97.9 F (36.6 C)  SpO2: 95%  Weight: 122 lb 6.4 oz (55.5 kg)   Body mass index is 20.37 kg/m. Physical Exam  Labs reviewed: Recent Labs    11/18/23 0649 11/19/23 0512 11/20/23 0426  NA 135 131* 131*  K 4.3 4.1 3.8  CL 104 100 97*  CO2 23 22 22   GLUCOSE 100* 123* 96  BUN 25* 35* 30*  CREATININE 0.89 0.98 0.88  CALCIUM  8.5* 8.6* 8.2*   Recent Labs    08/02/23 0000 11/16/23 2210  AST 25 44*  ALT 22 42  ALKPHOS 118 104  BILITOT  --  0.8  PROT  --  6.6  ALBUMIN  --  3.6   Recent Labs    08/13/23 1130 11/16/23 2210 11/17/23 0350 11/18/23 1242 11/19/23 0512 11/20/23 0426 11/26/23 0000  WBC 7.6 10.5   < > 10.2 9.9 8.6 7.6  NEUTROABS 4.3 6.3  --   --   --   --   --   HGB 11.8* 10.6*   < > 7.6* 7.3* 7.3* 7.5*  HCT 35.0* 32.1*   < > 22.7* 22.4* 22.1* 22*  MCV 103.6* 105.6*   < > 105.1* 105.2* 105.7*  --   PLT 177 196   < > 128* 138* 142* 231   < > = values in this interval not displayed.   Lab Results  Component Value Date   TSH 2.92 09/13/2023   Lab Results  Component Value Date   HGBA1C 5.5 01/12/2013   Lab Results  Component Value Date   CHOL 114 08/02/2023    HDL 53 08/02/2023   LDLCALC 44 08/02/2023   TRIG 86 08/02/2023    Significant Diagnostic Results in last 30 days:  No results found.  Assessment/Plan   Acute viral upper respiratory infection with wheezing Wheezing and nasal congestion likely due to viral illness. Chest x-ray shows perihilar inflammation. COVID and flu swabs negative. Differential includes viral illness with perihilar inflammation and wheezing. - Prescribed prednisone 20 mg daily for 5 days. - Administer nebulizers twice a day and as needed.  Wheezing With perihilar inflammation on xray  See above  Combined systolic and diastolic heart failure Chronic dyspnea on exertion worsened. Recent cardiology visit with Lasix  40 mg twice a week. BNP elevated at 7337. Chest x-ray shows no pulmonary edema. Weight decreased by 2 pounds. Blood pressure controlled. - Await cardiology input for further management. -will give one dose of lasix  today while we are waiting on cardiology input.   Anemia iron  deficiency No change  HTN Controlled

## 2024-02-07 NOTE — Telephone Encounter (Signed)
 Notes attached to the lab results under results and routed to Dr. Charlanne at the facility.

## 2024-02-07 NOTE — Telephone Encounter (Signed)
 Facility calling to make provider aware that they will be faxing pt's results today. They would like Dr. Fanny feedback. Please advise

## 2024-02-08 DIAGNOSIS — I5042 Chronic combined systolic (congestive) and diastolic (congestive) heart failure: Secondary | ICD-10-CM | POA: Diagnosis not present

## 2024-02-10 ENCOUNTER — Inpatient Hospital Stay (HOSPITAL_BASED_OUTPATIENT_CLINIC_OR_DEPARTMENT_OTHER): Admitting: Oncology

## 2024-02-10 ENCOUNTER — Ambulatory Visit: Payer: Self-pay | Admitting: Oncology

## 2024-02-10 ENCOUNTER — Inpatient Hospital Stay: Attending: Oncology

## 2024-02-10 VITALS — BP 137/66 | HR 70 | Temp 97.9°F | Resp 18 | Ht 65.0 in | Wt 120.0 lb

## 2024-02-10 DIAGNOSIS — R41841 Cognitive communication deficit: Secondary | ICD-10-CM | POA: Diagnosis not present

## 2024-02-10 DIAGNOSIS — C185 Malignant neoplasm of splenic flexure: Secondary | ICD-10-CM

## 2024-02-10 DIAGNOSIS — R2689 Other abnormalities of gait and mobility: Secondary | ICD-10-CM | POA: Diagnosis not present

## 2024-02-10 DIAGNOSIS — Z7901 Long term (current) use of anticoagulants: Secondary | ICD-10-CM | POA: Diagnosis not present

## 2024-02-10 DIAGNOSIS — S32810S Multiple fractures of pelvis with stable disruption of pelvic ring, sequela: Secondary | ICD-10-CM | POA: Diagnosis not present

## 2024-02-10 DIAGNOSIS — D539 Nutritional anemia, unspecified: Secondary | ICD-10-CM | POA: Diagnosis not present

## 2024-02-10 DIAGNOSIS — M069 Rheumatoid arthritis, unspecified: Secondary | ICD-10-CM | POA: Diagnosis not present

## 2024-02-10 DIAGNOSIS — R2681 Unsteadiness on feet: Secondary | ICD-10-CM | POA: Diagnosis not present

## 2024-02-10 DIAGNOSIS — Z8673 Personal history of transient ischemic attack (TIA), and cerebral infarction without residual deficits: Secondary | ICD-10-CM | POA: Diagnosis not present

## 2024-02-10 DIAGNOSIS — M25551 Pain in right hip: Secondary | ICD-10-CM | POA: Diagnosis not present

## 2024-02-10 DIAGNOSIS — Z85038 Personal history of other malignant neoplasm of large intestine: Secondary | ICD-10-CM | POA: Diagnosis not present

## 2024-02-10 DIAGNOSIS — I4891 Unspecified atrial fibrillation: Secondary | ICD-10-CM | POA: Diagnosis not present

## 2024-02-10 DIAGNOSIS — M6281 Muscle weakness (generalized): Secondary | ICD-10-CM | POA: Diagnosis not present

## 2024-02-10 LAB — CBC WITH DIFFERENTIAL (CANCER CENTER ONLY)
Abs Immature Granulocytes: 0.09 K/uL — ABNORMAL HIGH (ref 0.00–0.07)
Basophils Absolute: 0 K/uL (ref 0.0–0.1)
Basophils Relative: 0 %
Eosinophils Absolute: 0 K/uL (ref 0.0–0.5)
Eosinophils Relative: 0 %
HCT: 33.3 % — ABNORMAL LOW (ref 36.0–46.0)
Hemoglobin: 11.2 g/dL — ABNORMAL LOW (ref 12.0–15.0)
Immature Granulocytes: 1 %
Lymphocytes Relative: 11 %
Lymphs Abs: 1.3 K/uL (ref 0.7–4.0)
MCH: 34.3 pg — ABNORMAL HIGH (ref 26.0–34.0)
MCHC: 33.6 g/dL (ref 30.0–36.0)
MCV: 101.8 fL — ABNORMAL HIGH (ref 80.0–100.0)
Monocytes Absolute: 1.5 K/uL — ABNORMAL HIGH (ref 0.1–1.0)
Monocytes Relative: 12 %
Neutro Abs: 9.5 K/uL — ABNORMAL HIGH (ref 1.7–7.7)
Neutrophils Relative %: 76 %
Platelet Count: 215 K/uL (ref 150–400)
RBC: 3.27 MIL/uL — ABNORMAL LOW (ref 3.87–5.11)
RDW: 14.3 % (ref 11.5–15.5)
WBC Count: 12.5 K/uL — ABNORMAL HIGH (ref 4.0–10.5)
nRBC: 0 % (ref 0.0–0.2)

## 2024-02-10 LAB — CEA (ACCESS): CEA (CHCC): 6.07 ng/mL — ABNORMAL HIGH (ref 0.00–5.00)

## 2024-02-10 NOTE — Progress Notes (Signed)
 Kathy Velez OFFICE PROGRESS NOTE   Diagnosis: Colon cancer, anemia  INTERVAL HISTORY:   Kathy Velez for a scheduled visit.  She is here with her daughter.  She fell in August and fractured the pubis and developed a pelvic hematoma.  She underwent embolization of the right internal iliac artery branches.  She now lives in the skilled unit at [facility].  She is no longer on anticoagulation therapy.  She reports her arthritis is under good control with the current medical regimen.  No rectal bleeding. She is being treated for congestive heart failure.  She now uses nebulizers for wheezing and cough. Objective:  Vital signs in last 24 hours:  Blood pressure 137/66, pulse 70, temperature 97.9 F (36.6 C), temperature source Temporal, resp. rate 18, height 5' 5 (1.651 m), weight 120 lb (54.4 kg), SpO2 98%.    Lymphatics: No cervical, supraclavicular, axillary, or inguinal nodes Resp: Lungs with bilateral expiratory wheeze/rhonchi, no respiratory distress Cardio: Regular rate and rhythm GI: No hepatosplenomegaly, nontender, no mass Vascular: No leg edema   Portacath/PICC-without erythema  Lab Results:  Lab Results  Component Value Date   WBC 12.5 (H) 02/10/2024   HGB 11.2 (L) 02/10/2024   HCT 33.3 (L) 02/10/2024   MCV 101.8 (H) 02/10/2024   PLT 215 02/10/2024   NEUTROABS 9.5 (H) 02/10/2024    CMP  Lab Results  Component Value Date   NA 131 (L) 11/20/2023   K 3.8 11/20/2023   CL 97 (L) 11/20/2023   CO2 22 11/20/2023   GLUCOSE 96 11/20/2023   BUN 30 (H) 11/20/2023   CREATININE 0.88 11/20/2023   CALCIUM  8.2 (L) 11/20/2023   PROT 6.6 11/16/2023   ALBUMIN 3.6 11/16/2023   AST 44 (H) 11/16/2023   ALT 42 11/16/2023   ALKPHOS 104 11/16/2023   BILITOT 0.8 11/16/2023   GFRNONAA >60 11/20/2023   GFRAA 79.35 02/02/2021    Lab Results  Component Value Date   CEA1 8.2 (H) 03/07/2022   CEA 6.51 (H) 08/13/2023     Medications: I have reviewed the patient's  current medications.   Assessment/Plan: Colon cancer-splenic flexure mass on colonoscopy 02/22/2021 Biopsy 02/22/2021-invasive well differentiated adenocarcinoma, loss of MLH1 and PMS2 expression, MSI high, MLH1 hyper methylation present CTs 03/31/2021-no evidence of metastatic disease, possible mass at the splenic flexure, 4 mm left upper lung nodule, bilateral lung scarring Cycle 1 pembrolizumab  06/13/2021 Cycle 2 pembrolizumab  07/04/2021 Cycle 3 pembrolizumab  07/25/2021 Cycle 4 pembrolizumab  08/22/2021 Colonoscopy 09/20/2021-no residual mass at the splenic flexure/descending colon CT abdomen/pelvis 03/06/2022-diverticulosis without evidence of acute diverticulitis, partial colectomy Macrocytic anemia Sessile serrated polyp with focus of high-grade dysplasia on colonoscopy 02/22/2021 Colon cancer 2006, status post a right colectomy-node positive cancer per patient, received adjuvant chemotherapy Atrial fibrillation, maintained on anticoagulation History of a CVA and TIA Mild cognitive impairment Rheumatoid arthritis Hypothyroidism 10.  Chronic lung disease? 11.  Fall with head injury 10/22/2021 Scalp hematoma with bleeding 12/06/2021 12.  01/30/2022-displaced distal right fibular fracture, small displaced posterior malleolar fracture 13.  Admission 03/07/2022 with acute GI bleeding 14.  Fall with right distal fibular/malleolar fracture November 2023 15.  11/16/2023 fall: CTs-acute displaced right inferior and superior pubic ramus fractures, acute nondisplaced right iliac fracture, large right extraperitoneal hematoma, right internal/external obturator intramuscular hematoma        Disposition: Kathy Velez is in clinical remission from colon cancer.  She has a history of chronic macrocytic anemia, potentially related to underlying myelodysplasia.  She developed severe anemia after a  fall and hematoma in August.  The anemia has corrected over the past several months.  Multiple nodule lung  lesions were seen on the CT 11/17/2023.  These are likely related to chronic lung disease/inflammation.  I do not recommend follow-up imaging unless this is needed for management of chronic lung disease.  Kathy Velez will return for an office and lab visit in 5 months.  Arley Hof, MD  02/10/2024  12:27 PM

## 2024-02-11 DIAGNOSIS — R2681 Unsteadiness on feet: Secondary | ICD-10-CM | POA: Diagnosis not present

## 2024-02-11 DIAGNOSIS — M6281 Muscle weakness (generalized): Secondary | ICD-10-CM | POA: Diagnosis not present

## 2024-02-11 DIAGNOSIS — M25551 Pain in right hip: Secondary | ICD-10-CM | POA: Diagnosis not present

## 2024-02-11 DIAGNOSIS — R41841 Cognitive communication deficit: Secondary | ICD-10-CM | POA: Diagnosis not present

## 2024-02-11 DIAGNOSIS — S32810S Multiple fractures of pelvis with stable disruption of pelvic ring, sequela: Secondary | ICD-10-CM | POA: Diagnosis not present

## 2024-02-11 DIAGNOSIS — R2689 Other abnormalities of gait and mobility: Secondary | ICD-10-CM | POA: Diagnosis not present

## 2024-02-11 NOTE — Telephone Encounter (Signed)
 Addressed in another encounter

## 2024-02-11 NOTE — Progress Notes (Signed)
 Actually had not.  Thank you

## 2024-02-12 DIAGNOSIS — M25551 Pain in right hip: Secondary | ICD-10-CM | POA: Diagnosis not present

## 2024-02-12 DIAGNOSIS — R2681 Unsteadiness on feet: Secondary | ICD-10-CM | POA: Diagnosis not present

## 2024-02-12 DIAGNOSIS — M6281 Muscle weakness (generalized): Secondary | ICD-10-CM | POA: Diagnosis not present

## 2024-02-12 DIAGNOSIS — R2689 Other abnormalities of gait and mobility: Secondary | ICD-10-CM | POA: Diagnosis not present

## 2024-02-12 DIAGNOSIS — R41841 Cognitive communication deficit: Secondary | ICD-10-CM | POA: Diagnosis not present

## 2024-02-12 DIAGNOSIS — S32810S Multiple fractures of pelvis with stable disruption of pelvic ring, sequela: Secondary | ICD-10-CM | POA: Diagnosis not present

## 2024-02-12 NOTE — Telephone Encounter (Signed)
 Caller Merwyn) is following-up on patient's lab results faxed on 11/14 and 11/17 and next steps.

## 2024-02-13 DIAGNOSIS — R2681 Unsteadiness on feet: Secondary | ICD-10-CM | POA: Diagnosis not present

## 2024-02-13 DIAGNOSIS — M6281 Muscle weakness (generalized): Secondary | ICD-10-CM | POA: Diagnosis not present

## 2024-02-13 DIAGNOSIS — R41841 Cognitive communication deficit: Secondary | ICD-10-CM | POA: Diagnosis not present

## 2024-02-13 DIAGNOSIS — M25551 Pain in right hip: Secondary | ICD-10-CM | POA: Diagnosis not present

## 2024-02-13 DIAGNOSIS — S32810S Multiple fractures of pelvis with stable disruption of pelvic ring, sequela: Secondary | ICD-10-CM | POA: Diagnosis not present

## 2024-02-13 DIAGNOSIS — R2689 Other abnormalities of gait and mobility: Secondary | ICD-10-CM | POA: Diagnosis not present

## 2024-02-13 NOTE — Telephone Encounter (Signed)
 Please fax them no new orders for the elevated BNP

## 2024-02-17 ENCOUNTER — Encounter: Payer: Self-pay | Admitting: Internal Medicine

## 2024-02-17 ENCOUNTER — Ambulatory Visit: Payer: Self-pay | Admitting: Cardiovascular Disease

## 2024-02-17 ENCOUNTER — Non-Acute Institutional Stay (SKILLED_NURSING_FACILITY): Payer: Self-pay | Admitting: Internal Medicine

## 2024-02-17 DIAGNOSIS — R2689 Other abnormalities of gait and mobility: Secondary | ICD-10-CM | POA: Diagnosis not present

## 2024-02-17 DIAGNOSIS — S32810S Multiple fractures of pelvis with stable disruption of pelvic ring, sequela: Secondary | ICD-10-CM | POA: Diagnosis not present

## 2024-02-17 DIAGNOSIS — E039 Hypothyroidism, unspecified: Secondary | ICD-10-CM

## 2024-02-17 DIAGNOSIS — E038 Other specified hypothyroidism: Secondary | ICD-10-CM

## 2024-02-17 DIAGNOSIS — R2681 Unsteadiness on feet: Secondary | ICD-10-CM | POA: Diagnosis not present

## 2024-02-17 DIAGNOSIS — J849 Interstitial pulmonary disease, unspecified: Secondary | ICD-10-CM | POA: Diagnosis not present

## 2024-02-17 DIAGNOSIS — M6281 Muscle weakness (generalized): Secondary | ICD-10-CM | POA: Diagnosis not present

## 2024-02-17 DIAGNOSIS — D509 Iron deficiency anemia, unspecified: Secondary | ICD-10-CM

## 2024-02-17 DIAGNOSIS — I5042 Chronic combined systolic (congestive) and diastolic (congestive) heart failure: Secondary | ICD-10-CM | POA: Diagnosis not present

## 2024-02-17 DIAGNOSIS — M25551 Pain in right hip: Secondary | ICD-10-CM | POA: Diagnosis not present

## 2024-02-17 DIAGNOSIS — I4821 Permanent atrial fibrillation: Secondary | ICD-10-CM | POA: Diagnosis not present

## 2024-02-17 DIAGNOSIS — R41841 Cognitive communication deficit: Secondary | ICD-10-CM | POA: Diagnosis not present

## 2024-02-17 MED ORDER — DOXYCYCLINE MONOHYDRATE 100 MG PO CAPS: 100.0000 mg | ORAL_CAPSULE | Freq: Two times a day (BID) | ORAL | Status: AC

## 2024-02-17 NOTE — Telephone Encounter (Signed)
 Faxed Result management notes and orders for TSH and free T4 to be drawn in 3 months.

## 2024-02-17 NOTE — Progress Notes (Signed)
 Location: [facility] Service:  SNF ([id number])  Provider:   Code Status: DNR Goals of Care:     02/10/2024   12:12 PM  Advanced Directives  Does Patient Have a Medical Advance Directive? Yes  Type of Advance Directive Living will;Healthcare Power of Attorney  Does patient want to make changes to medical advance directive? No - Patient declined  Copy of Healthcare Power of Attorney in Chart? Yes - validated most recent copy scanned in chart (See row information)     Chief Complaint  Patient presents with   Acute Visit    HPI: Patient is a 89 y.o. female seen today for an acute visit for Cough an SOB  Lives in SNF in [place]  Seen on 02/06/2024 for Viral Illness with SOB and Wheezing  Chest Xray showed  Perihilar Inflammation Given Prednisone 20 mg For 5 days  She continued  to have cough  with Productive sputum And SOB Repeat Xray on the weekend showed Possible Vascular Congestion or Possible Intestinal Pneumonia She was started on Doxycycline  BID She feels Little Better today But continues to have cough and Some SOB No Wheezing  But feeling weak and Poor Appetite  H/o Chronic CHF Her BNP recently has been elevated at 7337 But Weight is Stable so Her Lasix  dose is not changed by Cardiology Wt Readings from Last 3 Encounters:  02/17/24 120 lb 8 oz (54.7 kg)  02/10/24 120 lb (54.4 kg)  02/07/24 122 lb 6.4 oz (55.5 kg)     Other issues  Has A Fib was on Eliquis  Stopped due to Falls and Injuries Hypertension and HLD H/o TIA  Cerebral Amyloid angiopathy on MRI Colon Cancer treated with Immunotherapy No Surgery in Remission  Anemia  Cognitive impairment MMSE 15/30 Rheumatoid arthritis   H/o CHF  with Elevated PHT Past Medical History:  Diagnosis Date   Allergy    seasonal   Anemia    Anxiety    Arthritis    Back   Asymptomatic menopausal state    Atrial fibrillation (HCC)    Cancer (HCC) 2006   Colon.  Basal Cell Skin cancer-  right arm   Cataract    removed bilateral   Chronic atrial fibrillation (HCC)    Colon cancer (HCC)    Constipation due to pain medication therapy    after heart surgery   Deficiency of other specified B group vitamins    Diplopia    Disorientation, unspecified    Dysrhythmia    PAF   GERD (gastroesophageal reflux disease)    Heart murmur    Hyperlipidemia    Hypertension    Hypothyroidism    Malignant neoplasm of colon, unspecified (HCC)    Other specified diseases of blood and blood-forming organs    Other specified disorders of bone density and structure, unspecified forearm    Other thrombophilia    per matrix   Overactive bladder    per Matrix   Personal history of other malignant neoplasm of large intestine    Presbycusis, bilateral    per matrix   RA (rheumatoid arthritis) (HCC)    Restless leg    Seizures (HCC)    after Heart Surgey   Stroke (HCC)    TIA- found by neurologist after    Valgus deformity, not elsewhere classified, right knee    per matrix   Vascular dementia, mild, without behavioral disturbance, psychotic disturbance, mood disturbance, and anxiety (HCC)    per matrix  Past Surgical History:  Procedure Laterality Date   ABDOMINAL HYSTERECTOMY  1970   Partial    COLON RESECTION  2006   cancer   COLON SURGERY     COLONOSCOPY     EYE SURGERY Bilateral    Cataract   IR ANGIOGRAM PELVIS SELECTIVE OR SUPRASELECTIVE  11/17/2023   IR ANGIOGRAM SELECTIVE EACH ADDITIONAL VESSEL  11/17/2023   IR EMBO ART  VEN HEMORR LYMPH EXTRAV  INC GUIDE ROADMAPPING  11/17/2023   IR US  GUIDE VASC ACCESS RIGHT  11/17/2023   MAXIMUM ACCESS (MAS)POSTERIOR LUMBAR INTERBODY FUSION (PLIF) 2 LEVEL N/A 04/12/2015   Procedure: Lumbar Three-Five Decompression, Pedicle Screw Fixation, and Posteriolateral Arthrodesis;  Surgeon: Fairy Levels, MD;  Location: [facility];  Service: Neurosurgery;  Laterality: N/A;  L3-4 L4-5 Maximum access posterior lumbar fusion, possible  interbodies and resection of synovial cyst at L4-5   MITRAL VALVE REPAIR  01/20/2013   Gore-tex cords to P1, P2, and P3. Magic suture to posterior medial commisure, #30 Physio 1 ring. Done in [place]    TONSILLECTOMY     about 1940   TRICUSPID VALVE SURGERY  01/20/2013   #28 TriAd ring done in [place]     Allergies  Allergen Reactions   Codeine Other (See Comments)    just don't take it well   Gabapentin  Other (See Comments)    Dizziness    Molds & Smuts Other (See Comments)    Dust.  Reaction is not listed on MAR    Pollen Extract Swelling    Grass   Bee Venom Swelling and Rash   Wasp Venom Swelling and Rash    Outpatient Encounter Medications as of 02/17/2024  Medication Sig   amoxicillin (AMOXIL) 500 MG capsule Take 2,000 mg by mouth as needed.   cloNIDine  (CATAPRES ) 0.1 MG tablet Take 1 tablet (0.1 mg total) by mouth daily as needed. If SBP >180 only   docusate sodium  (COLACE) 100 MG capsule Take 100 mg by mouth daily as needed for mild constipation.   levalbuterol  (XOPENEX ) 0.63 MG/3ML nebulizer solution Take 3 mLs (0.63 mg total) by nebulization every 6 (six) hours as needed for wheezing or shortness of breath.   polyethylene glycol (MIRALAX  / GLYCOLAX ) 17 g packet Take 17 g by mouth daily as needed for moderate constipation (constipation).   acetaminophen  (TYLENOL ) 500 MG tablet Take 2 tablets (1,000 mg total) by mouth every 6 (six) hours as needed for mild pain (pain score 1-3) or fever.   Cholecalciferol  (VITAMIN D ) 50 MCG (2000 UT) CAPS Take 2,000 Units by mouth in the morning.   ENBREL SURECLICK 50 MG/ML injection Inject 50 mg into the skin once a week. Tuesday   furosemide  (LASIX ) 40 MG tablet Take 40 mg every Tuesday and Saturday   ipratropium (ATROVENT ) 0.02 % nebulizer solution Take 0.5 mg by nebulization 3 (three) times daily. X 5 days   Iron , Ferrous Sulfate , 325 (65 Fe) MG TABS Take 325 mg by mouth daily.   leflunomide  (ARAVA ) 20 MG tablet Take 20 mg by mouth in  the morning.   levothyroxine  (SYNTHROID ) 88 MCG tablet Take 88 mcg by mouth daily before breakfast.   loratadine  (CLARITIN ) 10 MG tablet Take 1 tablet (10 mg total) by mouth daily for 7 days. (Patient not taking: Reported on 02/10/2024)   melatonin 5 MG TABS Take 5 mg by mouth at bedtime.   methocarbamol  (ROBAXIN ) 500 MG tablet Take 1 tablet (500 mg total) by mouth every 8 (eight) hours as needed  for muscle spasms (musculoskeletal pain). (Patient not taking: Reported on 02/10/2024)   metoprolol  tartrate (LOPRESSOR ) 100 MG tablet Take 150 mg by mouth daily. If heart rate <50 or SBP >180 or <100   mirtazapine  (REMERON ) 7.5 MG tablet Take 7.5 mg by mouth at bedtime.   Multiple Vitamin (MULTIVITAMIN ADULT PO) Take 1 tablet by mouth every morning.   Multiple Vitamins-Minerals (PRESERVISION AREDS 2) CAPS Take 1 capsule by mouth in the morning and at bedtime.   UNABLE TO FIND Apply 1 Application topically every morning. JOBST HOSE NEED SIZE   valsartan  (DIOVAN ) 160 MG tablet Take 160 mg by mouth every evening.   vitamin B-12 (CYANOCOBALAMIN ) 1000 MCG tablet Take 1,000 mcg by mouth in the morning.   [DISCONTINUED] traMADol  HCl 25 MG TABS Take 25 mg by mouth every 8 (eight) hours as needed. (Patient not taking: Reported on 02/10/2024)   No facility-administered encounter medications on file as of 02/17/2024.    Review of Systems:  Review of Systems  Constitutional:  Positive for activity change and appetite change.  HENT: Negative.    Respiratory:  Positive for cough, shortness of breath and wheezing.   Cardiovascular:  Negative for leg swelling.  Gastrointestinal:  Negative for constipation.  Genitourinary: Negative.   Musculoskeletal:  Positive for gait problem. Negative for arthralgias and myalgias.  Skin: Negative.   Neurological:  Positive for weakness. Negative for dizziness.  Psychiatric/Behavioral:  Positive for confusion. Negative for dysphoric mood and sleep disturbance.     Health  Maintenance  Topic Date Due   COVID-19 Vaccine (9 - 2025-26 season) 02/18/2024   Medicare Annual Wellness (AWV)  10/03/2024   DTaP/Tdap/Td (2 - Td or Tdap) 04/16/2027   Pneumococcal Vaccine: 50+ Years  Completed   Influenza Vaccine  Completed   Bone Density Scan  Completed   Zoster Vaccines- Shingrix  Completed   Meningococcal B Vaccine  Aged Out    Physical Exam: Vitals:   02/17/24 1241  BP: (!) 148/90  Pulse: 78  Resp: (!) 21  Temp: 98 F (36.7 C)  Weight: 120 lb 8 oz (54.7 kg)   Body mass index is 20.05 kg/m. Physical Exam Vitals reviewed.  Constitutional:      Appearance: Normal appearance.  HENT:     Head: Normocephalic.     Nose: Nose normal.     Mouth/Throat:     Mouth: Mucous membranes are moist.     Pharynx: Oropharynx is clear.  Eyes:     Pupils: Pupils are equal, round, and reactive to light.  Cardiovascular:     Rate and Rhythm: Normal rate. Rhythm irregular.     Pulses: Normal pulses.     Heart sounds: Normal heart sounds. No murmur heard. Pulmonary:     Effort: Pulmonary effort is normal.     Breath sounds: Rales present.     Comments: No Wheezing Rales Bilateral Abdominal:     General: Abdomen is flat. Bowel sounds are normal.     Palpations: Abdomen is soft.  Musculoskeletal:        General: No swelling.     Cervical back: Neck supple.  Skin:    General: Skin is warm.  Neurological:     General: No focal deficit present.     Mental Status: She is alert and oriented to person, place, and time.  Psychiatric:        Mood and Affect: Mood normal.        Thought Content: Thought content normal.  Labs reviewed: Basic Metabolic Panel: Recent Labs    04/04/23 0000 08/02/23 0000 09/13/23 0000 09/19/23 0000 11/18/23 0649 11/19/23 0512 11/20/23 0426  NA  --  133*  --    < > 135 131* 131*  K  --  4.2  --    < > 4.3 4.1 3.8  CL  --  99  --    < > 104 100 97*  CO2  --  23*  --    < > 23 22 22   GLUCOSE  --   --   --    < > 100* 123* 96   BUN  --  21  --    < > 25* 35* 30*  CREATININE  --  0.9  --    < > 0.89 0.98 0.88  CALCIUM   --  9.2  --    < > 8.5* 8.6* 8.2*  TSH 12.60* 20.30* 2.92  --   --   --   --    < > = values in this interval not displayed.   Liver Function Tests: Recent Labs    08/02/23 0000 11/16/23 2210  AST 25 44*  ALT 22 42  ALKPHOS 118 104  BILITOT  --  0.8  PROT  --  6.6  ALBUMIN  --  3.6   No results for input(s): LIPASE, AMYLASE in the last 8760 hours. No results for input(s): AMMONIA in the last 8760 hours. CBC: Recent Labs    08/13/23 1130 11/16/23 2210 11/17/23 0350 11/19/23 0512 11/20/23 0426 11/26/23 0000 02/10/24 1148  WBC 7.6 10.5   < > 9.9 8.6 7.6 12.5*  NEUTROABS 4.3 6.3  --   --   --   --  9.5*  HGB 11.8* 10.6*   < > 7.3* 7.3* 7.5* 11.2*  HCT 35.0* 32.1*   < > 22.4* 22.1* 22* 33.3*  MCV 103.6* 105.6*   < > 105.2* 105.7*  --  101.8*  PLT 177 196   < > 138* 142* 231 215   < > = values in this interval not displayed.   Lipid Panel: Recent Labs    04/04/23 0000 08/02/23 0000  CHOL 108 114  HDL 49 53  LDLCALC 41 44  TRIG 93 86   Lab Results  Component Value Date   HGBA1C 5.5 01/12/2013    Procedures since last visit: No results found.  Assessment/Plan 1.Possible Interstitial pneumonia (HCC) (Primary) Patient is on Doxycyline Feels Little Better not on Oxygen this morning Also Gettting Xopenox Nebs If she does not improve to consider giving extra  Lasix  dose especially as she has Elevated BNP and Possible Vascular congestion in Xray  2. Chronic combined systolic and diastolic heart failure (HCC) On Lasix  2/week.  Weight is stable. Monitoring the weight closely  3. Iron  deficiency anemia, unspecified iron  deficiency anemia type On Iron   4. Permanent atrial fibrillation (HCC) Off Eliquis  due to falls On Metoprolol   5. Hypothyroidism, unspecified type Tsh 6.77 No change in Levothyroxine  Dosing Follow up in 3 months    Labs/tests ordered:  * No  order type specified * Next appt:  Visit date not found

## 2024-02-18 DIAGNOSIS — S32810S Multiple fractures of pelvis with stable disruption of pelvic ring, sequela: Secondary | ICD-10-CM | POA: Diagnosis not present

## 2024-02-18 DIAGNOSIS — M6281 Muscle weakness (generalized): Secondary | ICD-10-CM | POA: Diagnosis not present

## 2024-02-18 DIAGNOSIS — R2689 Other abnormalities of gait and mobility: Secondary | ICD-10-CM | POA: Diagnosis not present

## 2024-02-18 DIAGNOSIS — R41841 Cognitive communication deficit: Secondary | ICD-10-CM | POA: Diagnosis not present

## 2024-02-18 DIAGNOSIS — M25551 Pain in right hip: Secondary | ICD-10-CM | POA: Diagnosis not present

## 2024-02-18 DIAGNOSIS — R2681 Unsteadiness on feet: Secondary | ICD-10-CM | POA: Diagnosis not present

## 2024-02-19 DIAGNOSIS — S32810S Multiple fractures of pelvis with stable disruption of pelvic ring, sequela: Secondary | ICD-10-CM | POA: Diagnosis not present

## 2024-02-19 DIAGNOSIS — R41841 Cognitive communication deficit: Secondary | ICD-10-CM | POA: Diagnosis not present

## 2024-02-19 DIAGNOSIS — R2681 Unsteadiness on feet: Secondary | ICD-10-CM | POA: Diagnosis not present

## 2024-02-19 DIAGNOSIS — M25551 Pain in right hip: Secondary | ICD-10-CM | POA: Diagnosis not present

## 2024-02-19 DIAGNOSIS — R2689 Other abnormalities of gait and mobility: Secondary | ICD-10-CM | POA: Diagnosis not present

## 2024-02-19 DIAGNOSIS — M6281 Muscle weakness (generalized): Secondary | ICD-10-CM | POA: Diagnosis not present

## 2024-02-21 DIAGNOSIS — M6281 Muscle weakness (generalized): Secondary | ICD-10-CM | POA: Diagnosis not present

## 2024-02-21 DIAGNOSIS — S32810S Multiple fractures of pelvis with stable disruption of pelvic ring, sequela: Secondary | ICD-10-CM | POA: Diagnosis not present

## 2024-02-21 DIAGNOSIS — R2681 Unsteadiness on feet: Secondary | ICD-10-CM | POA: Diagnosis not present

## 2024-02-21 DIAGNOSIS — R2689 Other abnormalities of gait and mobility: Secondary | ICD-10-CM | POA: Diagnosis not present

## 2024-02-21 DIAGNOSIS — R41841 Cognitive communication deficit: Secondary | ICD-10-CM | POA: Diagnosis not present

## 2024-02-21 DIAGNOSIS — M25551 Pain in right hip: Secondary | ICD-10-CM | POA: Diagnosis not present

## 2024-03-12 ENCOUNTER — Non-Acute Institutional Stay (SKILLED_NURSING_FACILITY): Payer: Self-pay | Admitting: Adult Health

## 2024-03-12 DIAGNOSIS — I5042 Chronic combined systolic (congestive) and diastolic (congestive) heart failure: Secondary | ICD-10-CM

## 2024-03-16 ENCOUNTER — Encounter: Payer: Self-pay | Admitting: Adult Health

## 2024-03-16 MED ORDER — FUROSEMIDE 20 MG PO TABS: 20.0000 mg | ORAL_TABLET | ORAL | Status: AC | PRN

## 2024-03-16 NOTE — Progress Notes (Signed)
 " Location:  [facility] of Service:  SNF (31) Provider:  Tawni America, NP    Patient Care Team: Charlanne Fredia CROME, MD as PCP - General (Internal Medicine) Croitoru, Jerel, MD as PCP - Cardiology (Cardiology) [name], DO as Consulting Physician (Neurology)  Extended Emergency Contact Information Primary Emergency Contact: [name],[name] Address: [address]          [place], [place] [address] [place] Home Phone: [phone] Mobile Phone: [phone] Relation: Daughter Secondary Emergency Contact: [name] Address: [address]          [place], [place] [address] [place] Phone: [phone] Relation: Son  Code Status:  DNR Goals of care: Advanced Directive information    02/10/2024   12:12 PM  Advanced Directives  Does Patient Have a Medical Advance Directive? Yes  Type of Advance Directive Living will;Healthcare Power of Attorney  Does patient want to make changes to medical advance directive? No - Patient declined  Copy of Healthcare Power of Attorney in Chart? Yes - validated most recent copy scanned in chart (See row information)     Chief Complaint  Patient presents with   Medical Management of Chronic Issues    HPI:  The patient is a 89 year old seen for chronic medical management.   Resides in skilled care at [facility].   Pelvic fracture - Sustained a fall on November 16, 2023, resulting in right superior and inferior pubic rami fractures and a pelvic hematoma with active extravasation. - Hospitalized from August 23 to November 20, 2023, for management of pelvic injuries. - Underwent angioembolism of the right pelvic vessel on November 17, 2023. - Recent CT scan of the chest, abdomen, and pelvis revealed a pelvic hematoma measuring 9.4 x 6.7 x 9.4 cm with active bleeding. -she has made progress with therapy can walk with her walker, no reports pain for the visit.   Treated for pneumonia  with doxycycline  02/16/24 CXR showed possible interstitial pna vs CHF  She had a choking episode and speech therapy is working with her. She is using strategies for prevention and is on a regular diet.   Afib Off eliquis  due to pelvic hematoma Rate is controlled  Follows with cardiology  Congestive heart failure - History of congestive heart failure with reduced ejection fraction of 45-50% as of September 24, 2023. Hx of mitral and tricuspid valve repair.  -has chronic DOE and edema. Has gained 4 lbs. Taking lasix  twice weekly. Issues of frequency and incontinence with diuretics.  -nurse asking for prn order to help manage  Pro BNP 7337 Nov 2025 ordered by cardiology as baseline value.   ILD - Chronic interstitial lung disease and emphysema. - Pulmonary hypertension. - 11/17/23 CT scan showed consolidation in the posterior right middle lobe, a 6 mm left upper lobe nodule, a 5 mm left lower lobe nodule, and small airway impactions.  Cognitive impairment -MMSE 15/30 - Underlying dementia. - Resides in a skilled care area.  Insomnia Started back Remeron  due to difficulty sleeping 12/26/23 Denies sleep issues  Rheumatologic disease - Rheumatoid arthritis managed with Arava  and Enbrel.  Anemia - On iron  supplementation for anemia. - Hemoglobin 10.4 MCV 104 Nov 2025  History of malignancy - History of colon adenocarcinoma. S/p immunotherapy - Last CEA level was 6.51 on Aug 13, 2023. - Followed by oncology.  Thyroid  dysfunction - Hypothyroidism with TSH of 2.92 in June 2025. - Continues thyroid  supplementation.  Hyperlipidemia Off lipitor  per cardiology to decrease pill burden and LDL was 41, repeat 01/21/24 LDL 102   HTN Controlled 130-150s/80-90s Past Medical History:  Diagnosis Date   Allergy    seasonal   Anemia    Anxiety    Arthritis    Back   Asymptomatic menopausal state    Atrial fibrillation (HCC)    Cancer (HCC) 2006   Colon.  Basal Cell Skin cancer- right  arm   Cataract    removed bilateral   Chronic atrial fibrillation (HCC)    Colon cancer (HCC)    Constipation due to pain medication therapy    after heart surgery   Deficiency of other specified B group vitamins    Diplopia    Disorientation, unspecified    Dysrhythmia    PAF   GERD (gastroesophageal reflux disease)    Heart murmur    Hyperlipidemia    Hypertension    Hypothyroidism    Malignant neoplasm of colon, unspecified (HCC)    Other specified diseases of blood and blood-forming organs    Other specified disorders of bone density and structure, unspecified forearm    Other thrombophilia    per matrix   Overactive bladder    per Matrix   Personal history of other malignant neoplasm of large intestine    Presbycusis, bilateral    per matrix   RA (rheumatoid arthritis) (HCC)    Restless leg    Seizures (HCC)    after Heart Surgey   Stroke (HCC)    TIA- found by neurologist after    Valgus deformity, not elsewhere classified, right knee    per matrix   Vascular dementia, mild, without behavioral disturbance, psychotic disturbance, mood disturbance, and anxiety (HCC)    per matrix   Past Surgical History:  Procedure Laterality Date   ABDOMINAL HYSTERECTOMY  1970   Partial    COLON RESECTION  2006   cancer   COLON SURGERY     COLONOSCOPY     EYE SURGERY Bilateral    Cataract   IR ANGIOGRAM PELVIS SELECTIVE OR SUPRASELECTIVE  11/17/2023   IR ANGIOGRAM SELECTIVE EACH ADDITIONAL VESSEL  11/17/2023   IR EMBO ART  VEN HEMORR LYMPH EXTRAV  INC GUIDE ROADMAPPING  11/17/2023   IR US  GUIDE VASC ACCESS RIGHT  11/17/2023   MAXIMUM ACCESS (MAS)POSTERIOR LUMBAR INTERBODY FUSION (PLIF) 2 LEVEL N/A 04/12/2015   Procedure: Lumbar Three-Five Decompression, Pedicle Screw Fixation, and Posteriolateral Arthrodesis;  Surgeon: Fairy Levels, MD;  Location: [facility];  Service: Neurosurgery;  Laterality: N/A;  L3-4 L4-5 Maximum access posterior lumbar fusion, possible interbodies and  resection of synovial cyst at L4-5   MITRAL VALVE REPAIR  01/20/2013   Gore-tex cords to P1, P2, and P3. Magic suture to posterior medial commisure, #30 Physio 1 ring. Done in [place]    TONSILLECTOMY     about 1940   TRICUSPID VALVE SURGERY  01/20/2013   #28 TriAd ring done in [place]     Allergies  Allergen Reactions   Codeine Other (See Comments)    just don't take it well   Gabapentin  Other (See Comments)    Dizziness    Molds & Smuts Other (See Comments)    Dust.  Reaction is not listed on MAR    Pollen Extract Swelling    Grass   Bee Venom Swelling and Rash   Wasp Venom Swelling and Rash    Outpatient Encounter Medications as of 03/12/2024  Medication Sig   acetaminophen  (TYLENOL ) 500 MG  tablet Take 2 tablets (1,000 mg total) by mouth every 6 (six) hours as needed for mild pain (pain score 1-3) or fever.   amoxicillin (AMOXIL) 500 MG capsule Take 2,000 mg by mouth as needed.   Cholecalciferol  (VITAMIN D ) 50 MCG (2000 UT) CAPS Take 2,000 Units by mouth in the morning.   cloNIDine  (CATAPRES ) 0.1 MG tablet Take 1 tablet (0.1 mg total) by mouth daily as needed. If SBP >180 only   docusate sodium  (COLACE) 100 MG capsule Take 100 mg by mouth daily as needed for mild constipation.   ENBREL SURECLICK 50 MG/ML injection Inject 50 mg into the skin once a week. Tuesday   furosemide  (LASIX ) 40 MG tablet Take 40 mg every Tuesday and Saturday   ipratropium (ATROVENT ) 0.02 % nebulizer solution Take 0.5 mg by nebulization 3 (three) times daily. X 5 days   Iron , Ferrous Sulfate , 325 (65 Fe) MG TABS Take 325 mg by mouth daily.   leflunomide  (ARAVA ) 20 MG tablet Take 20 mg by mouth in the morning.   levalbuterol  (XOPENEX ) 0.63 MG/3ML nebulizer solution Take 3 mLs (0.63 mg total) by nebulization every 6 (six) hours as needed for wheezing or shortness of breath.   levothyroxine  (SYNTHROID ) 88 MCG tablet Take 88 mcg by mouth daily before breakfast.   melatonin 5 MG TABS Take 5 mg by mouth at  bedtime.   methocarbamol  (ROBAXIN ) 500 MG tablet Take 1 tablet (500 mg total) by mouth every 8 (eight) hours as needed for muscle spasms (musculoskeletal pain). (Patient not taking: Reported on 02/10/2024)   metoprolol  tartrate (LOPRESSOR ) 100 MG tablet Take 150 mg by mouth daily. If heart rate <50 or SBP >180 or <100   mirtazapine  (REMERON ) 7.5 MG tablet Take 7.5 mg by mouth at bedtime.   Multiple Vitamin (MULTIVITAMIN ADULT PO) Take 1 tablet by mouth every morning.   Multiple Vitamins-Minerals (PRESERVISION AREDS 2) CAPS Take 1 capsule by mouth in the morning and at bedtime.   polyethylene glycol (MIRALAX  / GLYCOLAX ) 17 g packet Take 17 g by mouth daily as needed for moderate constipation (constipation).   UNABLE TO FIND Apply 1 Application topically every morning. JOBST HOSE NEED SIZE   valsartan  (DIOVAN ) 160 MG tablet Take 160 mg by mouth every evening.   vitamin B-12 (CYANOCOBALAMIN ) 1000 MCG tablet Take 1,000 mcg by mouth in the morning.   No facility-administered encounter medications on file as of 03/12/2024.    Review of Systems  Constitutional:  Negative for activity change, appetite change, chills, diaphoresis, fatigue, fever and unexpected weight change.  HENT:  Negative for congestion.   Respiratory:  Positive for shortness of breath (on exertion). Negative for cough and wheezing.   Cardiovascular:  Positive for leg swelling. Negative for chest pain and palpitations.  Gastrointestinal:  Negative for abdominal distention, abdominal pain, constipation and diarrhea.  Genitourinary:  Negative for difficulty urinating and dysuria.  Musculoskeletal:  Positive for arthralgias and gait problem. Negative for back pain, joint swelling and myalgias.  Neurological:  Negative for dizziness, tremors, seizures, syncope, facial asymmetry, speech difficulty, weakness, light-headedness, numbness and headaches.  Psychiatric/Behavioral:  Negative for agitation and behavioral problems.        Memory  loss    Immunization History  Administered Date(s) Administered   Fluad Quad(high Dose 65+) 01/05/2019, 12/07/2021, 01/15/2023, 12/24/2023   Fluad Trivalent(High Dose 65+) 01/15/2023   H1N1 04/07/2008   INFLUENZA, HIGH DOSE SEASONAL PF 01/15/2012, 12/23/2014, 01/22/2020   Influenza Whole 12/29/2008, 12/22/2009   Influenza,inj,Quad PF,6+ Mos  01/22/2014, 12/10/2016, 01/17/2018   Influenza,trivalent, recombinat, inj, PF 01/05/2016   Influenza-Unspecified 04/07/2008, 12/15/2014, 01/13/2021   Moderna Covid-19 Vaccine  Bivalent Booster 59yrs & up 01/29/2022, 01/15/2023, 12/24/2023   Moderna Sars-Covid-2 Vaccination 04/07/2019, 05/05/2019, 02/09/2020   PFIZER(Purple Top)SARS-COV-2 Vaccination 01/06/2021   PPD Test 07/17/2010, 04/19/2015, 04/25/2015   Pfizer Covid-19 Vaccine Bivalent Booster 54yrs & up 07/19/2022   Pneumococcal Conjugate-13 11/10/2013   Pneumococcal Polysaccharide-23 12/27/2010, 12/05/2016   RSV,unspecified 03/28/2022   Tdap 04/15/2017   Zoster Recombinant(Shingrix) 04/09/2017, 06/18/2017   Pertinent  Health Maintenance Due  Topic Date Due   Influenza Vaccine  Completed   Bone Density Scan  Completed      08/29/2022    3:31 PM 04/18/2023   12:52 PM 10/04/2023   10:46 AM 10/23/2023    2:10 PM 11/27/2023   10:26 AM  Fall Risk  Falls in the past year? 1 0 0 0 1  Was there an injury with Fall? 1  0  0  0  1   Fall Risk Category Calculator 2 0 0 0 2  Patient at Risk for Falls Due to   History of fall(s) History of fall(s);Impaired balance/gait History of fall(s);Impaired balance/gait  Fall risk Follow up Falls evaluation completed Falls evaluation completed Falls evaluation completed Falls evaluation completed;Education provided Falls evaluation completed;Education provided     Data saved with a previous flowsheet row definition   Functional Status Survey:    Vitals:   03/16/24 1223  BP: 126/81  Pulse: 71  Resp: 17  Temp: 98.3 F (36.8 C)  SpO2: 96%  Weight: 124 lb  (56.2 kg)   Body mass index is 20.63 kg/m. Physical Exam Vitals reviewed.  Constitutional:      Appearance: Normal appearance.  Cardiovascular:     Rate and Rhythm: Normal rate. Rhythm irregular.     Heart sounds: Murmur heard.  Pulmonary:     Effort: Pulmonary effort is normal. No respiratory distress.     Breath sounds: No wheezing.     Comments: Slight wheeze, Abdominal:     General: Bowel sounds are normal. There is no distension.     Palpations: Abdomen is soft.     Tenderness: There is no abdominal tenderness. There is no guarding.  Musculoskeletal:     Right lower leg: Edema (+1) present.     Left lower leg: Edema (+1) present.  Skin:    General: Skin is warm and dry.  Neurological:     General: No focal deficit present.     Mental Status: She is alert. Mental status is at baseline.     Labs reviewed: Recent Labs    11/18/23 0649 11/19/23 0512 11/20/23 0426  NA 135 131* 131*  K 4.3 4.1 3.8  CL 104 100 97*  CO2 23 22 22   GLUCOSE 100* 123* 96  BUN 25* 35* 30*  CREATININE 0.89 0.98 0.88  CALCIUM  8.5* 8.6* 8.2*   Recent Labs    08/02/23 0000 11/16/23 2210  AST 25 44*  ALT 22 42  ALKPHOS 118 104  BILITOT  --  0.8  PROT  --  6.6  ALBUMIN  --  3.6   Recent Labs    08/13/23 1130 11/16/23 2210 11/17/23 0350 11/19/23 0512 11/20/23 0426 11/26/23 0000 02/10/24 1148  WBC 7.6 10.5   < > 9.9 8.6 7.6 12.5*  NEUTROABS 4.3 6.3  --   --   --   --  9.5*  HGB 11.8* 10.6*   < > 7.3* 7.3*  7.5* 11.2*  HCT 35.0* 32.1*   < > 22.4* 22.1* 22* 33.3*  MCV 103.6* 105.6*   < > 105.2* 105.7*  --  101.8*  PLT 177 196   < > 138* 142* 231 215   < > = values in this interval not displayed.   Lab Results  Component Value Date   TSH 2.92 09/13/2023   Lab Results  Component Value Date   HGBA1C 5.5 01/12/2013   Lab Results  Component Value Date   CHOL 114 08/02/2023   HDL 53 08/02/2023   LDLCALC 44 08/02/2023   TRIG 86 08/02/2023    Significant Diagnostic  Results in last 30 days:  IR Angiogram Pelvis Selective Or Supraselective Result Date: 11/17/2023 INDICATION: Clemens, pelvic fracture with expanding hematoma and continued arterial extravasation noted on recent CT EXAM: ULTRASOUND GUIDANCE FOR VASCULAR ACCESS RIGHT INTERNAL ILIAC ARTERIOGRAPHY SUB SELECTIVE SECOND ORDER BRANCH EMBOLIZATION X2 MEDICATIONS: No periprocedural antibiotics were indicated ANESTHESIA/SEDATION: Intravenous Fentanyl  50mcg and Versed  1mg  were administered by RN during a total moderate (conscious) sedation time of 35 minutes; the patient's level of consciousness and physiological / cardiorespiratory status were monitored continuously by radiology RN under my direct supervision. PROCEDURE: Informed consent was obtained from the patient following explanation of the procedure, risks, benefits and alternatives. The patient understands, agrees and consents for the procedure. All questions were addressed. A time out was performed prior to the initiation of the procedure. Maximal barrier sterile technique utilized including caps, mask, sterile gowns, sterile gloves, large sterile drape, hand hygiene, and Betadine prep. Right common femoral artery accessed with a micropuncture set under real-time ultrasound guidance, exchanged for a 5 French vascular sheath, through which a 5 French Kumpe catheter was advanced and used to selectively catheterize the right internal iliac artery. Confirmatory arteriography was performed. A coaxial Renegade hi Flo microcatheter with a fathom 016 guidewire was advanced to selectively catheterize 2 branches of the anterior division right internal iliac artery for selective embolization using Gelfoam slurry, to stasis of flow into peripheral branches. Follow-up arteriography was performed each level. No evidence of nontarget embolization. The catheters and sheath were then removed and hemostasis achieved with the aid of calc to device. The patient tolerated the procedure  well. CONTRAST:  35 mL Omnipaque  300 IA FLUOROSCOPY: Radiation Exposure Index (as provided by the fluoroscopic device): 292 mGy Kerma COMPLICATIONS: None immediate. IMPRESSION: 1. Technically successful sub selective embolization of right internal iliac artery branches with temporary embolic (Gelfoam slurry) without immediate complication. Electronically Signed   By: JONETTA Faes M.D.   On: 11/17/2023 14:46   IR US  Guide Vasc Access Right Result Date: 11/17/2023 INDICATION: Clemens, pelvic fracture with expanding hematoma and continued arterial extravasation noted on recent CT EXAM: ULTRASOUND GUIDANCE FOR VASCULAR ACCESS RIGHT INTERNAL ILIAC ARTERIOGRAPHY SUB SELECTIVE SECOND ORDER BRANCH EMBOLIZATION X2 MEDICATIONS: No periprocedural antibiotics were indicated ANESTHESIA/SEDATION: Intravenous Fentanyl  50mcg and Versed  1mg  were administered by RN during a total moderate (conscious) sedation time of 35 minutes; the patient's level of consciousness and physiological / cardiorespiratory status were monitored continuously by radiology RN under my direct supervision. PROCEDURE: Informed consent was obtained from the patient following explanation of the procedure, risks, benefits and alternatives. The patient understands, agrees and consents for the procedure. All questions were addressed. A time out was performed prior to the initiation of the procedure. Maximal barrier sterile technique utilized including caps, mask, sterile gowns, sterile gloves, large sterile drape, hand hygiene, and Betadine prep. Right common femoral artery accessed with  a micropuncture set under real-time ultrasound guidance, exchanged for a 5 French vascular sheath, through which a 5 French Kumpe catheter was advanced and used to selectively catheterize the right internal iliac artery. Confirmatory arteriography was performed. A coaxial Renegade hi Flo microcatheter with a fathom 016 guidewire was advanced to selectively catheterize 2 branches of  the anterior division right internal iliac artery for selective embolization using Gelfoam slurry, to stasis of flow into peripheral branches. Follow-up arteriography was performed each level. No evidence of nontarget embolization. The catheters and sheath were then removed and hemostasis achieved with the aid of calc to device. The patient tolerated the procedure well. CONTRAST:  35 mL Omnipaque  300 IA FLUOROSCOPY: Radiation Exposure Index (as provided by the fluoroscopic device): 292 mGy Kerma COMPLICATIONS: None immediate. IMPRESSION: 1. Technically successful sub selective embolization of right internal iliac artery branches with temporary embolic (Gelfoam slurry) without immediate complication. Electronically Signed   By: JONETTA Faes M.D.   On: 11/17/2023 14:46   IR EMBO ART  VEN HEMORR LYMPH EXTRAV  INC GUIDE ROADMAPPING Result Date: 11/17/2023 INDICATION: Clemens, pelvic fracture with expanding hematoma and continued arterial extravasation noted on recent CT EXAM: ULTRASOUND GUIDANCE FOR VASCULAR ACCESS RIGHT INTERNAL ILIAC ARTERIOGRAPHY SUB SELECTIVE SECOND ORDER BRANCH EMBOLIZATION X2 MEDICATIONS: No periprocedural antibiotics were indicated ANESTHESIA/SEDATION: Intravenous Fentanyl  50mcg and Versed  1mg  were administered by RN during a total moderate (conscious) sedation time of 35 minutes; the patient's level of consciousness and physiological / cardiorespiratory status were monitored continuously by radiology RN under my direct supervision. PROCEDURE: Informed consent was obtained from the patient following explanation of the procedure, risks, benefits and alternatives. The patient understands, agrees and consents for the procedure. All questions were addressed. A time out was performed prior to the initiation of the procedure. Maximal barrier sterile technique utilized including caps, mask, sterile gowns, sterile gloves, large sterile drape, hand hygiene, and Betadine prep. Right common femoral artery  accessed with a micropuncture set under real-time ultrasound guidance, exchanged for a 5 French vascular sheath, through which a 5 French Kumpe catheter was advanced and used to selectively catheterize the right internal iliac artery. Confirmatory arteriography was performed. A coaxial Renegade hi Flo microcatheter with a fathom 016 guidewire was advanced to selectively catheterize 2 branches of the anterior division right internal iliac artery for selective embolization using Gelfoam slurry, to stasis of flow into peripheral branches. Follow-up arteriography was performed each level. No evidence of nontarget embolization. The catheters and sheath were then removed and hemostasis achieved with the aid of calc to device. The patient tolerated the procedure well. CONTRAST:  35 mL Omnipaque  300 IA FLUOROSCOPY: Radiation Exposure Index (as provided by the fluoroscopic device): 292 mGy Kerma COMPLICATIONS: None immediate. IMPRESSION: 1. Technically successful sub selective embolization of right internal iliac artery branches with temporary embolic (Gelfoam slurry) without immediate complication. Electronically Signed   By: JONETTA Faes M.D.   On: 11/17/2023 14:46   IR Angiogram Selective Each Additional Vessel Result Date: 11/17/2023 INDICATION: Clemens, pelvic fracture with expanding hematoma and continued arterial extravasation noted on recent CT EXAM: ULTRASOUND GUIDANCE FOR VASCULAR ACCESS RIGHT INTERNAL ILIAC ARTERIOGRAPHY SUB SELECTIVE SECOND ORDER BRANCH EMBOLIZATION X2 MEDICATIONS: No periprocedural antibiotics were indicated ANESTHESIA/SEDATION: Intravenous Fentanyl  50mcg and Versed  1mg  were administered by RN during a total moderate (conscious) sedation time of 35 minutes; the patient's level of consciousness and physiological / cardiorespiratory status were monitored continuously by radiology RN under my direct supervision. PROCEDURE: Informed consent was obtained from the patient  following explanation of the  procedure, risks, benefits and alternatives. The patient understands, agrees and consents for the procedure. All questions were addressed. A time out was performed prior to the initiation of the procedure. Maximal barrier sterile technique utilized including caps, mask, sterile gowns, sterile gloves, large sterile drape, hand hygiene, and Betadine prep. Right common femoral artery accessed with a micropuncture set under real-time ultrasound guidance, exchanged for a 5 French vascular sheath, through which a 5 French Kumpe catheter was advanced and used to selectively catheterize the right internal iliac artery. Confirmatory arteriography was performed. A coaxial Renegade hi Flo microcatheter with a fathom 016 guidewire was advanced to selectively catheterize 2 branches of the anterior division right internal iliac artery for selective embolization using Gelfoam slurry, to stasis of flow into peripheral branches. Follow-up arteriography was performed each level. No evidence of nontarget embolization. The catheters and sheath were then removed and hemostasis achieved with the aid of calc to device. The patient tolerated the procedure well. CONTRAST:  35 mL Omnipaque  300 IA FLUOROSCOPY: Radiation Exposure Index (as provided by the fluoroscopic device): 292 mGy Kerma COMPLICATIONS: None immediate. IMPRESSION: 1. Technically successful sub selective embolization of right internal iliac artery branches with temporary embolic (Gelfoam slurry) without immediate complication. Electronically Signed   By: JONETTA Faes M.D.   On: 11/17/2023 14:46   CT CHEST W CONTRAST Result Date: 11/17/2023 CLINICAL DATA:  Fall, with suspected acute comminuted right inferior pubic ramus fracture, concern is for urinary injury. Blunt polytrauma with potential for acute chest trauma as well. EXAM: CT CHEST WITH CONTRAST CT ABDOMEN AND PELVIS WITH AND WITHOUT CONTRAST TECHNIQUE: Multidetector CT imaging of the chest was performed during  intravenous contrast administration. Multidetector CT imaging of the abdomen and pelvis was performed prior to and following the standard protocol before and during bolus administration of intravenous contrast. RADIATION DOSE REDUCTION: This exam was performed according to the departmental dose-optimization program which includes automated exposure control, adjustment of the mA and/or kV according to patient size and/or use of iterative reconstruction technique. CONTRAST:  OMNIPAQUE  IOHEXOL  350 MG/ML SOLN COMPARISON:  Most recent chest x-ray was PA Lat chest 09/23/2023, before that chest radiograph 08/06/2022. Comparison is made with the chest, abdomen and pelvis CT with contrast 03/31/2021, abdomen and pelvis CT with IV contrast 03/06/2022. FINDINGS: CT CHEST FINDINGS Cardiovascular: The heart is slightly enlarged. Again noted are sternotomy sutures with prosthetic tricuspid and mitral valves. There is chronic left atrial enlargement and central venous prominence. Patchy three-vessel coronary artery calcification. Enlarged pulmonary trunk again measures 3.6 cm indicating arterial hypertension. There is no central embolus on this nontailored exam. There is atherosclerosis in the aorta and great vessels without aneurysm, stenosis or dissection. No pericardial effusion. Mediastinum/Nodes: Small small hiatal hernia. Unremarkable esophagus. Thyroid  gland is not seen and could be removed, ablated or extremely atrophic. There is no intrathoracic or axillary adenopathy. The trachea is ectatic but clear. There is tracheobronchial chondrocalcinosis. Lungs/Pleura: There is biapical symmetric pleuroparenchymal scarring, calcifications and bronchiolectasis. There is mild centrilobular emphysema in the upper lobes, and subpleural reticulation in the lung fields with an upper lobe predominance consistent with chronic interstitial lung disease. There is increased consolidation posteriorly in the right middle lobe which is  most likely either due to pneumonia or aspiration. There is increased posterior atelectasis in the basilar lower lobes. Interval new 6 mm subpleural left upper lobe nodule on 5:55. There is a new pleural-based 5 mm left lower lobe nodule on 5:117. There is  new branching nodularity in the posterolateral left base consistent with small airway impactions. Similar new branching nodularity right lower lobe on 5:112, anterior left upper lobe on 5:48 and in the lingula on 5:75. There is no pleural effusion.  No pneumothorax. Musculoskeletal: Osteopenia, mild kyphosis and degenerative change thoracic spine. No acute or significant osseous findings. The CT ABDOMEN AND PELVIS FINDINGS Hepatobiliary: No focal liver abnormality is seen. No gallstones, gallbladder wall thickening, or biliary dilatation. Pancreas: Unremarkable. No pancreatic ductal dilatation or surrounding inflammatory changes. Spleen: Normal in size without focal abnormality. Adrenals/Urinary Tract: No adrenal mass is seen. There is no renal mass enhancement. Stable 1 cm Bosniak 1 cyst anterior right kidney. Occasional subcentimeter Bosniak 2 cortical cysts of both kidneys are too small to characterize. There is cortical thinning in both kidneys. No urinary stone or obstruction. The bladder wall is unremarkable, but the bladder is displaced into the left hemipelvis and mildly compressed by a sizable right perivesical extraperitoneal hematoma. No contrast extravasation outside of the bladder is seen. Stomach/Bowel: Prior partial right hemicolectomy with primary ileocolic anastomosis. No dilatation or overt wall thickening. Advanced sigmoid diverticulosis without evidence of diverticulitis. Vascular/Lymphatic: Large hematoma in the right hemipelvis along side the bladder, measuring 9.4 x 6.7 x 9.4 cm with active bleeding into the hematoma and scattered contrast levels on the delayed exam. On the initial arterial phase images there is curvilinear contrast within  the collection, but it could not be traced back to a specific vessel. Additional free hemoperitoneum collects above the bladder dome to the left and right. Reproductive: Status post hysterectomy. No adnexal masses. Other: Free hemorrhage above the bladder and hematoma to the right of the bladder as above. No free air. Musculoskeletal: Comminuted fracture, not significantly displaced involving medial aspect of the right superior pubic ramus and the mid to medial aspect of the right inferior pubic ramus, with swelling in the adjacent right obturator internus and externus. There is osteopenia, degenerative and postsurgical change of the spine. No other regional skeletal fracture is evident. IMPRESSION: 1. Hematoma in the right hemipelvis along side the bladder, measuring 9.4 x 6.7 x 9.4 cm, with active bleeding into the hematoma and scattered contrast levels on the delayed exam. The active bleeding could not be traced back to a specific vessel. 2. Comminuted fractures of the right superior and inferior pubic rami. 3. No urinary bladder extravasation is seen. 4. No other acute trauma related findings in the chest, abdomen or pelvis. 5. Chronic interstitial lung disease with increased consolidation in the posterior right middle lobe which could be due to pneumonia or aspiration. 6. New 6 mm left upper lobe and 5 mm left lower lobe nodules. Follow-up as indicated taking into account age and life expectancy. 7. Scattered peripheral branching nodularity new from prior study consistent with small airway impactions. 8. Aortic and coronary artery atherosclerosis. 9. Cardiomegaly with chronic left atrial enlargement and central venous prominence. Tricuspid and mitral prosthesis. 10. Enlarged pulmonary trunk indicating arterial hypertension. 11. Small hiatal hernia. 12. Advanced sigmoid diverticulosis. 13. Osteopenia and degenerative change. 14. Emphysema. 15. Critical Value/emergent results were called by telephone at the time  of interpretation on 11/17/2023 at 4:09 am to provider [facility] , who verbally acknowledged these results. Aortic Atherosclerosis (ICD10-I70.0) and Emphysema (ICD10-J43.9). Electronically Signed   By: Francis Quam M.D.   On: 11/17/2023 04:17   CT ABDOMEN PELVIS W WO CONTRAST Result Date: 11/17/2023 CLINICAL DATA:  Fall, with suspected acute comminuted right inferior pubic ramus fracture, concern  is for urinary injury. Blunt polytrauma with potential for acute chest trauma as well. EXAM: CT CHEST WITH CONTRAST CT ABDOMEN AND PELVIS WITH AND WITHOUT CONTRAST TECHNIQUE: Multidetector CT imaging of the chest was performed during intravenous contrast administration. Multidetector CT imaging of the abdomen and pelvis was performed prior to and following the standard protocol before and during bolus administration of intravenous contrast. RADIATION DOSE REDUCTION: This exam was performed according to the departmental dose-optimization program which includes automated exposure control, adjustment of the mA and/or kV according to patient size and/or use of iterative reconstruction technique. CONTRAST:  OMNIPAQUE  IOHEXOL  350 MG/ML SOLN COMPARISON:  Most recent chest x-ray was PA Lat chest 09/23/2023, before that chest radiograph 08/06/2022. Comparison is made with the chest, abdomen and pelvis CT with contrast 03/31/2021, abdomen and pelvis CT with IV contrast 03/06/2022. FINDINGS: CT CHEST FINDINGS Cardiovascular: The heart is slightly enlarged. Again noted are sternotomy sutures with prosthetic tricuspid and mitral valves. There is chronic left atrial enlargement and central venous prominence. Patchy three-vessel coronary artery calcification. Enlarged pulmonary trunk again measures 3.6 cm indicating arterial hypertension. There is no central embolus on this nontailored exam. There is atherosclerosis in the aorta and great vessels without aneurysm, stenosis or dissection. No pericardial effusion.  Mediastinum/Nodes: Small small hiatal hernia. Unremarkable esophagus. Thyroid  gland is not seen and could be removed, ablated or extremely atrophic. There is no intrathoracic or axillary adenopathy. The trachea is ectatic but clear. There is tracheobronchial chondrocalcinosis. Lungs/Pleura: There is biapical symmetric pleuroparenchymal scarring, calcifications and bronchiolectasis. There is mild centrilobular emphysema in the upper lobes, and subpleural reticulation in the lung fields with an upper lobe predominance consistent with chronic interstitial lung disease. There is increased consolidation posteriorly in the right middle lobe which is most likely either due to pneumonia or aspiration. There is increased posterior atelectasis in the basilar lower lobes. Interval new 6 mm subpleural left upper lobe nodule on 5:55. There is a new pleural-based 5 mm left lower lobe nodule on 5:117. There is new branching nodularity in the posterolateral left base consistent with small airway impactions. Similar new branching nodularity right lower lobe on 5:112, anterior left upper lobe on 5:48 and in the lingula on 5:75. There is no pleural effusion.  No pneumothorax. Musculoskeletal: Osteopenia, mild kyphosis and degenerative change thoracic spine. No acute or significant osseous findings. The CT ABDOMEN AND PELVIS FINDINGS Hepatobiliary: No focal liver abnormality is seen. No gallstones, gallbladder wall thickening, or biliary dilatation. Pancreas: Unremarkable. No pancreatic ductal dilatation or surrounding inflammatory changes. Spleen: Normal in size without focal abnormality. Adrenals/Urinary Tract: No adrenal mass is seen. There is no renal mass enhancement. Stable 1 cm Bosniak 1 cyst anterior right kidney. Occasional subcentimeter Bosniak 2 cortical cysts of both kidneys are too small to characterize. There is cortical thinning in both kidneys. No urinary stone or obstruction. The bladder wall is unremarkable, but the  bladder is displaced into the left hemipelvis and mildly compressed by a sizable right perivesical extraperitoneal hematoma. No contrast extravasation outside of the bladder is seen. Stomach/Bowel: Prior partial right hemicolectomy with primary ileocolic anastomosis. No dilatation or overt wall thickening. Advanced sigmoid diverticulosis without evidence of diverticulitis. Vascular/Lymphatic: Large hematoma in the right hemipelvis along side the bladder, measuring 9.4 x 6.7 x 9.4 cm with active bleeding into the hematoma and scattered contrast levels on the delayed exam. On the initial arterial phase images there is curvilinear contrast within the collection, but it could not be traced back to a  specific vessel. Additional free hemoperitoneum collects above the bladder dome to the left and right. Reproductive: Status post hysterectomy. No adnexal masses. Other: Free hemorrhage above the bladder and hematoma to the right of the bladder as above. No free air. Musculoskeletal: Comminuted fracture, not significantly displaced involving medial aspect of the right superior pubic ramus and the mid to medial aspect of the right inferior pubic ramus, with swelling in the adjacent right obturator internus and externus. There is osteopenia, degenerative and postsurgical change of the spine. No other regional skeletal fracture is evident. IMPRESSION: 1. Hematoma in the right hemipelvis along side the bladder, measuring 9.4 x 6.7 x 9.4 cm, with active bleeding into the hematoma and scattered contrast levels on the delayed exam. The active bleeding could not be traced back to a specific vessel. 2. Comminuted fractures of the right superior and inferior pubic rami. 3. No urinary bladder extravasation is seen. 4. No other acute trauma related findings in the chest, abdomen or pelvis. 5. Chronic interstitial lung disease with increased consolidation in the posterior right middle lobe which could be due to pneumonia or aspiration. 6.  New 6 mm left upper lobe and 5 mm left lower lobe nodules. Follow-up as indicated taking into account age and life expectancy. 7. Scattered peripheral branching nodularity new from prior study consistent with small airway impactions. 8. Aortic and coronary artery atherosclerosis. 9. Cardiomegaly with chronic left atrial enlargement and central venous prominence. Tricuspid and mitral prosthesis. 10. Enlarged pulmonary trunk indicating arterial hypertension. 11. Small hiatal hernia. 12. Advanced sigmoid diverticulosis. 13. Osteopenia and degenerative change. 14. Emphysema. 15. Critical Value/emergent results were called by telephone at the time of interpretation on 11/17/2023 at 4:09 am to provider [facility] , who verbally acknowledged these results. Aortic Atherosclerosis (ICD10-I70.0) and Emphysema (ICD10-J43.9). Electronically Signed   By: Francis Quam M.D.   On: 11/17/2023 04:17   CT PELVIS WO CONTRAST Result Date: 11/17/2023 CLINICAL DATA:  Hip trauma, fracture suspected, xray done EXAM: CT PELVIS WITHOUT CONTRAST TECHNIQUE: Multidetector CT imaging of the pelvis was performed following the standard protocol without intravenous contrast. RADIATION DOSE REDUCTION: This exam was performed according to the departmental dose-optimization program which includes automated exposure control, adjustment of the mA and/or kV according to patient size and/or use of iterative reconstruction technique. COMPARISON:  X-ray right hip 11/16/2023 FINDINGS: Urinary Tract:  Irregular right urinary bladder wall thick. Bowel: Colonic diverticulosis. Unremarkable visualized pelvic bowel loops. Vascular/Lymphatic: No pathologically enlarged lymph nodes. No significant vascular abnormality seen. Reproductive:  No mass or other significant abnormality Other: Large volume right space of Rezius high density free fluid. No intraperitoneal free gas. No organized fluid collection. Musculoskeletal: Acute comminuted and displaced right  inferior pubic rami and superior pubic rami/pubic symphysis fracture. Acute nondisplaced right iliac fracture (9:22; 4:50). Asymmetric and irregular right internal and external obturator musculature. IMPRESSION: 1. Acute comminuted and displaced right inferior pubic rami and superior pubic rami/pubic symphysis fracture. 2. Acute nondisplaced right iliac fracture. 3. Large volume right extraperitoneal hematoma with likely associated extraperitoneal urinary bladder injury. Intraperitoneal urinary bladder injury not excluded. Recommend CT urogram. 4. Right internal and external obturator intramuscular hematoma. Electronically Signed   By: Morgane  Naveau M.D.   On: 11/17/2023 00:53   CT Thoracic Spine Wo Contrast Result Date: 11/17/2023 CLINICAL DATA:  Back trauma, no prior imaging (Age >= 16y) EXAM: CT THORACIC AND LUMBAR SPINE WITHOUT CONTRAST TECHNIQUE: Multidetector CT imaging of the thoracic and lumbar spine was performed without contrast. Multiplanar CT  image reconstructions were also generated. RADIATION DOSE REDUCTION: This exam was performed according to the departmental dose-optimization program which includes automated exposure control, adjustment of the mA and/or kV according to patient size and/or use of iterative reconstruction technique. COMPARISON:  MR lumbar spine 04/23/2018 FINDINGS: CT THORACIC SPINE FINDINGS Alignment: Grade 1 anterolisthesis of T1 on T2, T2 on T3, T3 on T4, T4 on T5.\. Vertebrae: No acute fracture or focal pathologic process. Paraspinal and other soft tissues: Negative. Disc levels: Multilevel intervertebral disc space vacuum phenomenon. CT LUMBAR SPINE FINDINGS Segmentation: 5 lumbar type vertebrae. Alignment: Stable grade 1 anterolisthesis of L3 on L4 and L4 on L5. Vertebrae: L3-L5 posterolateral surgical hardware. No acute fracture or focal pathologic process. Paraspinal and other soft tissues: Negative. Disc levels: Multilevel intervertebral disc space vacuum phenomenon.  Other: Coarsened interlobular softball thickening. Peripheral reticulations. Atherosclerotic plaque. Colonic diverticulosis. IMPRESSION: CT THORACIC SPINE IMPRESSION No acute displaced fracture or traumatic listhesis of the thoracic spine. CT LUMBAR SPINE IMPRESSION No acute displaced fracture or traumatic listhesis of the lumbar spine. Aortic Atherosclerosis (ICD10-I70.0). Electronically Signed   By: Morgane  Naveau M.D.   On: 11/17/2023 00:47   CT Lumbar Spine Wo Contrast Result Date: 11/17/2023 CLINICAL DATA:  Back trauma, no prior imaging (Age >= 16y) EXAM: CT THORACIC AND LUMBAR SPINE WITHOUT CONTRAST TECHNIQUE: Multidetector CT imaging of the thoracic and lumbar spine was performed without contrast. Multiplanar CT image reconstructions were also generated. RADIATION DOSE REDUCTION: This exam was performed according to the departmental dose-optimization program which includes automated exposure control, adjustment of the mA and/or kV according to patient size and/or use of iterative reconstruction technique. COMPARISON:  MR lumbar spine 04/23/2018 FINDINGS: CT THORACIC SPINE FINDINGS Alignment: Grade 1 anterolisthesis of T1 on T2, T2 on T3, T3 on T4, T4 on T5.\. Vertebrae: No acute fracture or focal pathologic process. Paraspinal and other soft tissues: Negative. Disc levels: Multilevel intervertebral disc space vacuum phenomenon. CT LUMBAR SPINE FINDINGS Segmentation: 5 lumbar type vertebrae. Alignment: Stable grade 1 anterolisthesis of L3 on L4 and L4 on L5. Vertebrae: L3-L5 posterolateral surgical hardware. No acute fracture or focal pathologic process. Paraspinal and other soft tissues: Negative. Disc levels: Multilevel intervertebral disc space vacuum phenomenon. Other: Coarsened interlobular softball thickening. Peripheral reticulations. Atherosclerotic plaque. Colonic diverticulosis. IMPRESSION: CT THORACIC SPINE IMPRESSION No acute displaced fracture or traumatic listhesis of the thoracic spine. CT  LUMBAR SPINE IMPRESSION No acute displaced fracture or traumatic listhesis of the lumbar spine. Aortic Atherosclerosis (ICD10-I70.0). Electronically Signed   By: Morgane  Naveau M.D.   On: 11/17/2023 00:47   CT Head Wo Contrast Result Date: 11/16/2023 CLINICAL DATA:  Head trauma, minor (Age >= 65y); Neck trauma (Age >= 65y). Fall EXAM: CT HEAD WITHOUT CONTRAST CT CERVICAL SPINE WITHOUT CONTRAST TECHNIQUE: Multidetector CT imaging of the head and cervical spine was performed following the standard protocol without intravenous contrast. Multiplanar CT image reconstructions of the cervical spine were also generated. RADIATION DOSE REDUCTION: This exam was performed according to the departmental dose-optimization program which includes automated exposure control, adjustment of the mA and/or kV according to patient size and/or use of iterative reconstruction technique. COMPARISON:  CT head 12/06/2021, CT head and C-spine 10/22/2021 FINDINGS: CT HEAD FINDINGS Brain: Cerebral ventricle sizes are concordant with the degree of cerebral volume loss. Patchy and confluent areas of decreased attenuation are noted throughout the deep and periventricular white matter of the cerebral hemispheres bilaterally, compatible with chronic microvascular ischemic disease. No evidence of large-territorial acute infarction. No parenchymal hemorrhage.  No mass lesion. No extra-axial collection. No mass effect or midline shift. No hydrocephalus. Basilar cisterns are patent. Vascular: No hyperdense vessel. Atherosclerotic calcifications are present within the cavernous internal carotid and vertebral arteries. Skull: Old healed right orbital floor fracture. No acute fracture or focal lesion. Sinuses/Orbits: Paranasal sinuses and mastoid air cells are clear. Bilateral lens replacement. Otherwise the orbits are unremarkable. Other: None. CT CERVICAL SPINE FINDINGS Alignment: Grade 1 anterolisthesis of C2 on C3, grade 1 anterolisthesis of C3 on  C4. Retrolisthesis of C4 on C5 and C5 on C6. Skull base and vertebrae: Multilevel severe degenerative change of the spine with associated severe osseous neural foraminal stenosis of the right C4-C5 and C5-C6 level. No severe osseous central canal stenosis. No acute fracture. No aggressive appearing focal osseous lesion or focal pathologic process. Soft tissues and spinal canal: No prevertebral fluid or swelling. No visible canal hematoma. Upper chest: Biapical pleural/pulmonary scarring with associated calcifications on the left. Chronic coarsened interstitial markings. Other: Atherosclerotic plaque of the carotid arteries within the neck. IMPRESSION: 1. No acute intracranial abnormality. 2. No acute displaced fracture or traumatic listhesis of the cervical spine. 3. Multilevel severe degenerative change of the spine with associated severe osseous neural foraminal stenosis of the right C4-C5 and C5-C6 level. Electronically Signed   By: Morgane  Naveau M.D.   On: 11/16/2023 23:11   CT Cervical Spine Wo Contrast Result Date: 11/16/2023 CLINICAL DATA:  Head trauma, minor (Age >= 65y); Neck trauma (Age >= 65y). Fall EXAM: CT HEAD WITHOUT CONTRAST CT CERVICAL SPINE WITHOUT CONTRAST TECHNIQUE: Multidetector CT imaging of the head and cervical spine was performed following the standard protocol without intravenous contrast. Multiplanar CT image reconstructions of the cervical spine were also generated. RADIATION DOSE REDUCTION: This exam was performed according to the departmental dose-optimization program which includes automated exposure control, adjustment of the mA and/or kV according to patient size and/or use of iterative reconstruction technique. COMPARISON:  CT head 12/06/2021, CT head and C-spine 10/22/2021 FINDINGS: CT HEAD FINDINGS Brain: Cerebral ventricle sizes are concordant with the degree of cerebral volume loss. Patchy and confluent areas of decreased attenuation are noted throughout the deep and  periventricular white matter of the cerebral hemispheres bilaterally, compatible with chronic microvascular ischemic disease. No evidence of large-territorial acute infarction. No parenchymal hemorrhage. No mass lesion. No extra-axial collection. No mass effect or midline shift. No hydrocephalus. Basilar cisterns are patent. Vascular: No hyperdense vessel. Atherosclerotic calcifications are present within the cavernous internal carotid and vertebral arteries. Skull: Old healed right orbital floor fracture. No acute fracture or focal lesion. Sinuses/Orbits: Paranasal sinuses and mastoid air cells are clear. Bilateral lens replacement. Otherwise the orbits are unremarkable. Other: None. CT CERVICAL SPINE FINDINGS Alignment: Grade 1 anterolisthesis of C2 on C3, grade 1 anterolisthesis of C3 on C4. Retrolisthesis of C4 on C5 and C5 on C6. Skull base and vertebrae: Multilevel severe degenerative change of the spine with associated severe osseous neural foraminal stenosis of the right C4-C5 and C5-C6 level. No severe osseous central canal stenosis. No acute fracture. No aggressive appearing focal osseous lesion or focal pathologic process. Soft tissues and spinal canal: No prevertebral fluid or swelling. No visible canal hematoma. Upper chest: Biapical pleural/pulmonary scarring with associated calcifications on the left. Chronic coarsened interstitial markings. Other: Atherosclerotic plaque of the carotid arteries within the neck. IMPRESSION: 1. No acute intracranial abnormality. 2. No acute displaced fracture or traumatic listhesis of the cervical spine. 3. Multilevel severe degenerative change of the spine with associated  severe osseous neural foraminal stenosis of the right C4-C5 and C5-C6 level. Electronically Signed   By: Morgane  Naveau M.D.   On: 11/16/2023 23:11   DG Hip Unilat With Pelvis 2-3 Views Right Result Date: 11/16/2023 CLINICAL DATA:  fall, pain EXAM: DG HIP (WITH OR WITHOUT PELVIS) 2-3V RIGHT  COMPARISON:  CT abdomen pelvis 03/06/2022 FINDINGS: Cortical irregularity with likely acute displaced and comminuted right inferior pubic rami fracture. There is no evidence of hip fracture or dislocation of the right hip. No acute displaced fracture or dislocation of the left hip. No diastasis of the bones of the pelvis. Surgical hardware noted along the lower visualized lumbar spine. There is no evidence of arthropathy or other focal bone abnormality. Vascular calcifications. IMPRESSION: Cortical irregularity with likely acute displaced and comminuted right inferior pubic rami fracture. Electronically Signed   By: Morgane  Naveau M.D.   On: 11/16/2023 23:05    Assessment/Plan   Right pelvic fractures with hematoma, post-angioembolization Improved mobility with PT  Dysphagia Working with ST  Chronic heart failure with reduced ejection fraction Congestive heart failure with reduced ejection fraction (45-50%) and hypokinesis. Last echocardiogram on September 24, 2023.  Add prn lasix  twice weekly if gains 3 lb between weights   Atrial fibrillation Rate controlled on metoprolol  Off eliquis  as above Followed by cardiology   Chronic interstitial lung disease  Chronic DOE Hx of RA Not on oxygen  Chronic anemia secondary to blood loss and also iron  def Improving On iron  Follow CBC  Dementia Progressive decline in cognition and physical function c/w the disease. Continue supportive care in the skilled environment.  Rheumatoid arthritis Rheumatoid arthritis managed with Arava  and Enbrel.  HTN Controlled   Hyperlipidemia Off lipitor per cardiology  LDL rising Follow lipid panel  Hypothyroidism Hypothyroidism with last TSH in June 2025 at 2.92. Continuing thyroid  supplementation. - Continue thyroid  supplementation.  History of colon carcinoma, under oncology surveillance History of colon carcinoma. Last CEA was 6.51 on Aug 13, 2023. Under oncology surveillance.  Vitamin D  def On  supplementation  Dysphagia Working with ST  Has dietary strategies recommended on regular diet.    "

## 2024-03-17 ENCOUNTER — Other Ambulatory Visit: Payer: Self-pay | Admitting: Adult Health

## 2024-03-17 DIAGNOSIS — I10 Essential (primary) hypertension: Secondary | ICD-10-CM

## 2024-03-17 MED ORDER — VALSARTAN 160 MG PO TABS: 160.0000 mg | ORAL_TABLET | Freq: Every evening | ORAL | 0 refills | Status: AC

## 2024-03-22 ENCOUNTER — Encounter: Payer: Self-pay | Admitting: Cardiovascular Disease

## 2024-03-23 NOTE — Telephone Encounter (Signed)
 I do not think there is a connection between the bruising and the diuretic.  None of the cardiac medicines should make Kathy Velez more prone to bruising.

## 2024-04-02 ENCOUNTER — Encounter: Payer: Self-pay | Admitting: Adult Health

## 2024-04-02 ENCOUNTER — Non-Acute Institutional Stay (SKILLED_NURSING_FACILITY): Admitting: Adult Health

## 2024-04-02 DIAGNOSIS — J181 Lobar pneumonia, unspecified organism: Secondary | ICD-10-CM | POA: Diagnosis not present

## 2024-04-02 DIAGNOSIS — E871 Hypo-osmolality and hyponatremia: Secondary | ICD-10-CM

## 2024-04-02 MED ORDER — AMOXICILLIN-POT CLAVULANATE 875-125 MG PO TABS: 1.0000 | ORAL_TABLET | Freq: Two times a day (BID) | ORAL | Status: AC

## 2024-04-02 MED ORDER — SODIUM CHLORIDE 1 G PO TABS: 1.0000 g | ORAL_TABLET | Freq: Every day | ORAL | Status: AC

## 2024-04-02 NOTE — Progress Notes (Signed)
 " Location:  Medical Illustrator of Service:  SNF (31) Provider:   Bari America, ANP [facility] [phone]   Charlanne Fredia CROME, MD  Patient Care Team: Charlanne Fredia CROME, MD as PCP - General (Internal Medicine) Francyne Headland, MD as PCP - Cardiology (Cardiology) [name], DO as Consulting Physician (Neurology)  Extended Emergency Contact Information Primary Emergency Contact: [name],[name] Address: [address]          [place], [place] [address] [place] Home Phone: [phone] Mobile Phone: [phone] Relation: Daughter Secondary Emergency Contact: [name] Address: [address]          [place], [place] [address] [place] Phone: [phone] Relation: Son  Code Status:  DNR Goals of care: Advanced Directive information    02/10/2024   12:12 PM  Advanced Directives  Does Patient Have a Medical Advance Directive? Yes  Type of Advance Directive Living will;Healthcare Power of Attorney  Does patient want to make changes to medical advance directive? No - Patient declined  Copy of Healthcare Power of Attorney in Chart? Yes - validated most recent copy scanned in chart (See row information)     Chief Complaint  Patient presents with   Acute Visit    Fatigue, cough, vomiting    HPI:  The patient is a 89 year old who presents with fatigue, vomiting, and cough.  Fatigue - Persistent fatigue for several days  Nausea and vomiting - Vomited last evening and this morning - Nausea present for at least three days - Partial relief of symptoms with Zofran   Cough and airway symptoms - Worsening cough with congestion - One episode of choking - Speech therapy involved; regular diet with swallowing precautions and strategies  HTN Received 1 dose of as needed clonidine  for BP 175/100 BP improved now 132/78  Laboratory and imaging findings - Rapid flu and COVID swabs negative - Chest x-ray  showed abnormal findings Right upper lobe pneumonia.  - CBC: WBC 8.1, hemoglobin 11.3, hematocrit 34, MCV 101.9 - Sodium 127, BUN 19, creatinine 0.58, alkaline phosphatase 131, AST 35, ALT 29 - KUB abdomen showed no acute findings  Living situation - Resides in skilled care at [facility] Past Medical History:  Diagnosis Date   Allergy    seasonal   Anemia    Anxiety    Arthritis    Back   Asymptomatic menopausal state    Atrial fibrillation (HCC)    Cancer (HCC) 2006   Colon.  Basal Cell Skin cancer- right arm   Cataract    removed bilateral   Chronic atrial fibrillation (HCC)    Colon cancer (HCC)    Constipation due to pain medication therapy    after heart surgery   Deficiency of other specified B group vitamins    Diplopia    Disorientation, unspecified    Dysrhythmia    PAF   GERD (gastroesophageal reflux disease)    Heart murmur    Hyperlipidemia    Hypertension    Hypothyroidism    Malignant neoplasm of colon, unspecified (HCC)    Other specified diseases of blood and blood-forming organs    Other specified disorders of bone density and structure, unspecified forearm    Other thrombophilia    per matrix   Overactive bladder    per Matrix   Personal history of other malignant neoplasm of large intestine    Presbycusis, bilateral    per matrix   RA (rheumatoid  arthritis) (HCC)    Restless leg    Seizures (HCC)    after Heart Surgey   Stroke (HCC)    TIA- found by neurologist after    Valgus deformity, not elsewhere classified, right knee    per matrix   Vascular dementia, mild, without behavioral disturbance, psychotic disturbance, mood disturbance, and anxiety (HCC)    per matrix   Past Surgical History:  Procedure Laterality Date   ABDOMINAL HYSTERECTOMY  1970   Partial    COLON RESECTION  2006   cancer   COLON SURGERY     COLONOSCOPY     EYE SURGERY Bilateral    Cataract   IR ANGIOGRAM PELVIS SELECTIVE OR SUPRASELECTIVE  11/17/2023   IR  ANGIOGRAM SELECTIVE EACH ADDITIONAL VESSEL  11/17/2023   IR EMBO ART  VEN HEMORR LYMPH EXTRAV  INC GUIDE ROADMAPPING  11/17/2023   IR US  GUIDE VASC ACCESS RIGHT  11/17/2023   MAXIMUM ACCESS (MAS)POSTERIOR LUMBAR INTERBODY FUSION (PLIF) 2 LEVEL N/A 04/12/2015   Procedure: Lumbar Three-Five Decompression, Pedicle Screw Fixation, and Posteriolateral Arthrodesis;  Surgeon: Fairy Levels, MD;  Location: [facility];  Service: Neurosurgery;  Laterality: N/A;  L3-4 L4-5 Maximum access posterior lumbar fusion, possible interbodies and resection of synovial cyst at L4-5   MITRAL VALVE REPAIR  01/20/2013   Gore-tex cords to P1, P2, and P3. Magic suture to posterior medial commisure, #30 Physio 1 ring. Done in [place]    TONSILLECTOMY     about 1940   TRICUSPID VALVE SURGERY  01/20/2013   #28 TriAd ring done in [place]     Allergies[1]  Outpatient Encounter Medications as of 04/02/2024  Medication Sig   amoxicillin -clavulanate (AUGMENTIN ) 875-125 MG tablet Take 1 tablet by mouth 2 (two) times daily for 7 days.   sodium chloride  1 g tablet Take 1 tablet (1 g total) by mouth daily for 3 days.   acetaminophen  (TYLENOL ) 500 MG tablet Take 2 tablets (1,000 mg total) by mouth every 6 (six) hours as needed for mild pain (pain score 1-3) or fever.   amoxicillin  (AMOXIL ) 500 MG capsule Take 2,000 mg by mouth as needed.   Cholecalciferol  (VITAMIN D ) 50 MCG (2000 UT) CAPS Take 2,000 Units by mouth in the morning.   cloNIDine  (CATAPRES ) 0.1 MG tablet Take 1 tablet (0.1 mg total) by mouth daily as needed. If SBP >180 only   docusate sodium  (COLACE) 100 MG capsule Take 100 mg by mouth daily as needed for mild constipation.   ENBREL SURECLICK 50 MG/ML injection Inject 50 mg into the skin once a week. Tuesday   furosemide  (LASIX ) 20 MG tablet Take 1 tablet (20 mg total) by mouth as needed. Give twice weekly as needed if 3lb gain in between weights.   furosemide  (LASIX ) 40 MG tablet Take 40 mg every Tuesday and Saturday    ipratropium (ATROVENT ) 0.02 % nebulizer solution Take 0.5 mg by nebulization 3 (three) times daily. X 5 days   Iron , Ferrous Sulfate , 325 (65 Fe) MG TABS Take 325 mg by mouth daily.   leflunomide  (ARAVA ) 20 MG tablet Take 20 mg by mouth in the morning.   levalbuterol  (XOPENEX ) 0.63 MG/3ML nebulizer solution Take 3 mLs (0.63 mg total) by nebulization every 6 (six) hours as needed for wheezing or shortness of breath.   levothyroxine  (SYNTHROID ) 88 MCG tablet Take 88 mcg by mouth daily before breakfast.   melatonin 5 MG TABS Take 5 mg by mouth at bedtime.   metoprolol  tartrate (LOPRESSOR ) 100 MG tablet  Take 150 mg by mouth daily. If heart rate <50 or SBP >180 or <100   mirtazapine  (REMERON ) 7.5 MG tablet Take 7.5 mg by mouth at bedtime.   Multiple Vitamin (MULTIVITAMIN ADULT PO) Take 1 tablet by mouth every morning.   Multiple Vitamins-Minerals (PRESERVISION AREDS 2) CAPS Take 1 capsule by mouth in the morning and at bedtime.   polyethylene glycol (MIRALAX  / GLYCOLAX ) 17 g packet Take 17 g by mouth daily as needed for moderate constipation (constipation).   UNABLE TO FIND Apply 1 Application topically every morning. JOBST HOSE NEED SIZE   valsartan  (DIOVAN ) 160 MG tablet Take 1 tablet (160 mg total) by mouth every evening.   vitamin B-12 (CYANOCOBALAMIN ) 1000 MCG tablet Take 1,000 mcg by mouth in the morning.   No facility-administered encounter medications on file as of 04/02/2024.    Review of Systems  Constitutional:  Positive for fatigue. Negative for activity change, appetite change, chills, diaphoresis and fever.  HENT:  Positive for congestion. Negative for rhinorrhea, sore throat and voice change.   Respiratory:  Positive for cough and shortness of breath (on exertion). Negative for wheezing.   Cardiovascular:  Positive for leg swelling (chronic). Negative for chest pain.  Gastrointestinal:  Positive for nausea and vomiting. Negative for abdominal distention, abdominal pain, constipation  and diarrhea.  Genitourinary:  Negative for difficulty urinating, dysuria and urgency.  Musculoskeletal:  Positive for arthralgias and gait problem. Negative for back pain, myalgias and neck pain.  Skin:  Negative for rash.  Neurological:  Negative for dizziness and weakness.  Psychiatric/Behavioral:  Negative for confusion.        Memory loss    Immunization History  Administered Date(s) Administered   Fluad Quad(high Dose 65+) 01/05/2019, 12/07/2021, 01/15/2023, 12/24/2023   Fluad Trivalent(High Dose 65+) 01/15/2023   H1N1 04/07/2008   INFLUENZA, HIGH DOSE SEASONAL PF 01/15/2012, 12/23/2014, 01/22/2020   Influenza Whole 12/29/2008, 12/22/2009   Influenza,inj,Quad PF,6+ Mos 01/22/2014, 12/10/2016, 01/17/2018   Influenza,trivalent, recombinat, inj, PF 01/05/2016   Influenza-Unspecified 04/07/2008, 12/15/2014, 01/13/2021   Moderna Covid-19 Vaccine  Bivalent Booster 37yrs & up 01/29/2022, 01/15/2023, 12/24/2023   Moderna Sars-Covid-2 Vaccination 04/07/2019, 05/05/2019, 02/09/2020   PFIZER(Purple Top)SARS-COV-2 Vaccination 01/06/2021   PPD Test 07/17/2010, 04/19/2015, 04/25/2015   Pfizer Covid-19 Vaccine Bivalent Booster 32yrs & up 07/19/2022   Pneumococcal Conjugate-13 11/10/2013   Pneumococcal Polysaccharide-23 12/27/2010, 12/05/2016   RSV,unspecified 03/28/2022   Tdap 04/15/2017   Zoster Recombinant(Shingrix) 04/09/2017, 06/18/2017   Pertinent  Health Maintenance Due  Topic Date Due   Influenza Vaccine  Completed   Bone Density Scan  Completed      08/29/2022    3:31 PM 04/18/2023   12:52 PM 10/04/2023   10:46 AM 10/23/2023    2:10 PM 11/27/2023   10:26 AM  Fall Risk  Falls in the past year? 1 0 0 0 1  Was there an injury with Fall? 1  0  0  0  1   Fall Risk Category Calculator 2 0 0 0 2  Patient at Risk for Falls Due to   History of fall(s) History of fall(s);Impaired balance/gait History of fall(s);Impaired balance/gait  Fall risk Follow up Falls evaluation completed Falls  evaluation completed Falls evaluation completed Falls evaluation completed;Education provided Falls evaluation completed;Education provided     Data saved with a previous flowsheet row definition   Functional Status Survey:    Vitals:   04/02/24 1450  BP: 132/78  Pulse: 69  Resp: 17  Temp: 97.7 F (36.5 C)  SpO2: 94%  Weight: 119 lb 3.2 oz (54.1 kg)   Body mass index is 19.84 kg/m. Physical Exam Vitals and nursing note reviewed.  Constitutional:      General: She is not in acute distress.    Appearance: She is not diaphoretic.  HENT:     Head: Normocephalic and atraumatic.     Nose: Nose normal.     Mouth/Throat:     Mouth: Mucous membranes are moist.     Pharynx: Oropharynx is clear.  Neck:     Vascular: No JVD.  Cardiovascular:     Rate and Rhythm: Normal rate. Rhythm irregular.     Heart sounds: Murmur heard.  Pulmonary:     Effort: Pulmonary effort is normal. No respiratory distress.     Breath sounds: Wheezing (mild to RUL) present.  Abdominal:     General: Abdomen is flat. Bowel sounds are normal. There is no distension.     Palpations: Abdomen is soft.     Tenderness: There is no abdominal tenderness. There is no guarding.     Hernia: No hernia is present.  Musculoskeletal:     Right lower leg: Edema present.     Left lower leg: Edema present.  Skin:    General: Skin is warm and dry.  Neurological:     Mental Status: She is alert.     Comments: Oriented to self and place   Psychiatric:        Mood and Affect: Mood normal.     Labs reviewed: Recent Labs    11/18/23 0649 11/19/23 0512 11/20/23 0426  NA 135 131* 131*  K 4.3 4.1 3.8  CL 104 100 97*  CO2 23 22 22   GLUCOSE 100* 123* 96  BUN 25* 35* 30*  CREATININE 0.89 0.98 0.88  CALCIUM  8.5* 8.6* 8.2*   Recent Labs    08/02/23 0000 11/16/23 2210  AST 25 44*  ALT 22 42  ALKPHOS 118 104  BILITOT  --  0.8  PROT  --  6.6  ALBUMIN  --  3.6   Recent Labs    08/13/23 1130 11/16/23 2210  11/17/23 0350 11/19/23 0512 11/20/23 0426 11/26/23 0000 02/10/24 1148  WBC 7.6 10.5   < > 9.9 8.6 7.6 12.5*  NEUTROABS 4.3 6.3  --   --   --   --  9.5*  HGB 11.8* 10.6*   < > 7.3* 7.3* 7.5* 11.2*  HCT 35.0* 32.1*   < > 22.4* 22.1* 22* 33.3*  MCV 103.6* 105.6*   < > 105.2* 105.7*  --  101.8*  PLT 177 196   < > 138* 142* 231 215   < > = values in this interval not displayed.   Lab Results  Component Value Date   TSH 2.92 09/13/2023   Lab Results  Component Value Date   HGBA1C 5.5 01/12/2013   Lab Results  Component Value Date   CHOL 114 08/02/2023   HDL 53 08/02/2023   LDLCALC 44 08/02/2023   TRIG 86 08/02/2023    Significant Diagnostic Results in last 30 days:  No results found.  Assessment/Plan  Lobar pneumonia, right upper lobe Right upper lobe pneumonia confirmed by chest x-ray. Negative rapid flu and COVID swabs. - Prescribed Augmentin  for pneumonia.  Hyponatremia Sodium level at 127. Requires careful management due to heart failure and hypertension. - Prescribed 1 gram of sodium daily for three days. - Will repeat blood work next week.  Dysphagia with aspiration risk Concern  for aspiration due to dysphagia. Speech therapy involved and recommended regular diet with precautions. - Continue to monitor swallowing function with speech therapy.  Heart failure and hypertension Necessitate careful management of sodium intake. - Continue to monitor sodium intake carefully.   Family/ staff Communication: discussed with daughter Adin  Labs/tests ordered:  BMP     [1]  Allergies Allergen Reactions   Codeine Other (See Comments)    just don't take it well   Gabapentin  Other (See Comments)    Dizziness    Molds & Smuts Other (See Comments)    Dust.  Reaction is not listed on MAR    Pollen Extract Swelling    Grass   Bee Venom Swelling and Rash   Wasp Venom Swelling and Rash   "

## 2024-04-16 NOTE — Progress Notes (Signed)
 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 RH female with a history of TIAs and left frontal lobe infarct likely cardioembolic, cerebral amyloid angiopathy, atrial fibrillation not on anticoagulation due to HASBLED score of 3 and recurrent falls, SIADH, hypothyroidism, ILD, RA, history of colon cancer in remission, hypertension, hyperlipidemia , B12 deficiency seen today in follow up for memory loss.  Patient is no longer on antidementia medication given serious side effects, advanced age, multiple risk factors, as those risks outweighed the benefits of the medicine.   Patient was last seen on 04/18/2023. Memory is  stable with MMSE 23/30. Patient is able to participate on ADLs and continues to drive without difficulties. Mood is fair. This patient is accompanied in the office by her daughter who supplements the history.  Previous records as well as any outside records available were reviewed prior to todays visit.  No follow-up is indicated after discussion with daughter.  Recommend good control of cardiovascular risk factors.   Continue to control mood as per PCP Recommend PT OT for strength and balance Continue 24/7 monitoring   Discussed the use of AI scribe software for clinical note transcription with the patient, who gave verbal consent to proceed.  History of Present Illness Kathy Velez is a 89 year old female who presents with memory loss. She is accompanied by her daughter.  Over the past year, she has experienced a decline in both short-term and long-term memory, with difficulty remembering recent conversations and sometimes failing to recognize familiar people. Despite these challenges, she remains aware of her surroundings within her skilled nursing facility and does not exhibit wandering tendencies. No significant mood changes have been noted, maybe more irritable in the mornings. No hallucinations or paranoia. Her  sleep pattern remains consistent, sleeping from 9 PM to 7:30 AM, without nightmares or vivid dreams. She is assisted by facility staff with daily activities and medication management.  Her appetite has slightly decreased, but she continues to eat well, often consuming full meals despite initially stating she is not hungry. She supplements her diet with Ensure or protein drinks. She occasionally chokes on liquids and has received speech therapy exercises.  She uses a wheelchair for longer distances but is able to walk within the facility. This follows a previous pelvic fracture sustained from a fall while reaching for an object on the floor. She has no history of head injuries from this incident.  She has a history of urinary incontinence and urinary tract infections. Previously, she was on salt tablets for low sodium, but these were discontinued due to edema, and she is now on fluid restriction. She experiences fecal incontinence with loose stools, which is exacerbated by Lasix  use and low sodium levels.  She has a history of B12 deficiency and takes her vitamins daily. Her daughter manages her finances. No longer drives.       PREVIOUS MEDICATIONS: ACHI (severe diarrhea)          04/19/2024    4:00 PM 04/18/2023    1:00 PM 08/30/2022    8:00 AM  MMSE - Mini Mental State Exam  Orientation to time 4 3 4   Orientation to Place 2 5 5   Registration 3 3 3   Attention/ Calculation 5 5 3   Recall 1 1 1   Language- name 2 objects 1 2 1   Language- repeat 1 1 1   Language- follow 3 step command 3 3 3   Language-  read & follow direction 1 1 1   Write a sentence 1 1 0  Copy design 1 1 1   Total score 23 26 23        No data to display            Objective:    Neurological Exam:    VITALS:   Vitals:   04/17/24 1420  Resp: 20  Height: 5' 5 (1.651 m)    GEN:  The patient appears stated age and is in NAD. HEENT:  Normocephalic, atraumatic.   Neurological examination:  General: NAD,  well-groomed, appears stated age. Orientation: The patient is alert. Oriented to person, date, not to place  Cranial nerves: There is good facial symmetry.The speech is fluent and clear, slow paced. No aphasia or dysarthria. Fund of knowledge is reduced. Recent and remote memory are impaired. Attention and concentration are reduced. Able to name objects and repeat phrases.  Hearing is intact to conversational tone.   Sensation: Sensation is intact to light touch throughout Motor: Strength is at least antigravity x4. DTR's 1/4 in UE/LE     Movement examination:  Tone: There is normal tone in the UE/LE Abnormal movements:  no tremor.  No myoclonus.  No asterixis.   Coordination:  There is no decremation with RAM's. Normal finger to nose  Gait and Station: patient is in a wheelchair, has difficulty arising from a deep seated chair and needs full support needs to be held if standing up, gait not tested.  Thank you for allowing us  the opportunity to participate in the care of this nice patient. Please do not hesitate to contact us  for any questions or concerns.   Total time spent on today's visit was 30 minutes dedicated to this patient today, preparing to see patient, examining the patient, ordering tests and/or medications and counseling the patient, documenting clinical information in the EHR or other health record, independently interpreting results and communicating results to the patient/family, discussing treatment and goals, answering patient's questions and coordinating care.  Cc:  Charlanne Fredia CROME, MD  Camie Sevin 04/19/2024 4:56 PM      "

## 2024-04-17 ENCOUNTER — Ambulatory Visit: Payer: Medicare Other | Admitting: Physician Assistant

## 2024-04-17 ENCOUNTER — Encounter: Payer: Self-pay | Admitting: Physician Assistant

## 2024-04-17 VITALS — Resp 20 | Ht 65.0 in

## 2024-04-17 DIAGNOSIS — F01A0 Vascular dementia, mild, without behavioral disturbance, psychotic disturbance, mood disturbance, and anxiety: Secondary | ICD-10-CM

## 2024-04-28 ENCOUNTER — Encounter: Payer: Self-pay | Admitting: Orthopedic Surgery

## 2024-04-28 ENCOUNTER — Non-Acute Institutional Stay (SKILLED_NURSING_FACILITY): Payer: Self-pay | Admitting: Orthopedic Surgery

## 2024-04-28 DIAGNOSIS — R410 Disorientation, unspecified: Secondary | ICD-10-CM | POA: Diagnosis not present

## 2024-04-28 DIAGNOSIS — R1310 Dysphagia, unspecified: Secondary | ICD-10-CM | POA: Diagnosis not present

## 2024-04-28 DIAGNOSIS — R400 Somnolence: Secondary | ICD-10-CM | POA: Diagnosis not present

## 2024-04-28 DIAGNOSIS — I5042 Chronic combined systolic (congestive) and diastolic (congestive) heart failure: Secondary | ICD-10-CM | POA: Diagnosis not present

## 2024-04-28 DIAGNOSIS — E871 Hypo-osmolality and hyponatremia: Secondary | ICD-10-CM

## 2024-05-11 ENCOUNTER — Ambulatory Visit: Admitting: Cardiovascular Disease

## 2024-07-13 ENCOUNTER — Inpatient Hospital Stay

## 2024-07-13 ENCOUNTER — Inpatient Hospital Stay: Admitting: Oncology
